# Patient Record
Sex: Female | Born: 1955 | Race: Black or African American | Hispanic: No | Marital: Married | Smoking: Never smoker
Health system: Southern US, Community
[De-identification: ages and names within clinical notes are randomized; demographics above are authoritative.]

## PROBLEM LIST (undated history)

## (undated) DIAGNOSIS — R1310 Dysphagia, unspecified: Secondary | ICD-10-CM

## (undated) DIAGNOSIS — D649 Anemia, unspecified: Secondary | ICD-10-CM

## (undated) DIAGNOSIS — R112 Nausea with vomiting, unspecified: Secondary | ICD-10-CM

## (undated) DIAGNOSIS — K5900 Constipation, unspecified: Secondary | ICD-10-CM

## (undated) DIAGNOSIS — E669 Obesity, unspecified: Secondary | ICD-10-CM

## (undated) DIAGNOSIS — F32A Depression, unspecified: Secondary | ICD-10-CM

## (undated) DIAGNOSIS — N3941 Urge incontinence: Secondary | ICD-10-CM

## (undated) DIAGNOSIS — E559 Vitamin D deficiency, unspecified: Secondary | ICD-10-CM

## (undated) DIAGNOSIS — M87059 Idiopathic aseptic necrosis of unspecified femur: Secondary | ICD-10-CM

## (undated) DIAGNOSIS — E119 Type 2 diabetes mellitus without complications: Secondary | ICD-10-CM

## (undated) DIAGNOSIS — M1990 Unspecified osteoarthritis, unspecified site: Secondary | ICD-10-CM

## (undated) DIAGNOSIS — R600 Localized edema: Secondary | ICD-10-CM

## (undated) DIAGNOSIS — G20 Parkinson's disease: Secondary | ICD-10-CM

## (undated) DIAGNOSIS — R05 Cough: Secondary | ICD-10-CM

## (undated) DIAGNOSIS — E079 Disorder of thyroid, unspecified: Secondary | ICD-10-CM

## (undated) DIAGNOSIS — J45909 Unspecified asthma, uncomplicated: Secondary | ICD-10-CM

## (undated) DIAGNOSIS — M549 Dorsalgia, unspecified: Secondary | ICD-10-CM

## (undated) DIAGNOSIS — K219 Gastro-esophageal reflux disease without esophagitis: Secondary | ICD-10-CM

## (undated) DIAGNOSIS — F329 Major depressive disorder, single episode, unspecified: Secondary | ICD-10-CM

## (undated) DIAGNOSIS — G2581 Restless legs syndrome: Secondary | ICD-10-CM

## (undated) DIAGNOSIS — R079 Chest pain, unspecified: Secondary | ICD-10-CM

## (undated) DIAGNOSIS — G459 Transient cerebral ischemic attack, unspecified: Secondary | ICD-10-CM

## (undated) DIAGNOSIS — M2550 Pain in unspecified joint: Secondary | ICD-10-CM

## (undated) DIAGNOSIS — E7800 Pure hypercholesterolemia, unspecified: Secondary | ICD-10-CM

## (undated) DIAGNOSIS — F419 Anxiety disorder, unspecified: Secondary | ICD-10-CM

## (undated) DIAGNOSIS — G4700 Insomnia, unspecified: Secondary | ICD-10-CM

## (undated) DIAGNOSIS — Z9889 Other specified postprocedural states: Secondary | ICD-10-CM

## (undated) DIAGNOSIS — I10 Essential (primary) hypertension: Secondary | ICD-10-CM

## (undated) DIAGNOSIS — N289 Disorder of kidney and ureter, unspecified: Secondary | ICD-10-CM

## (undated) DIAGNOSIS — R058 Other specified cough: Secondary | ICD-10-CM

## (undated) DIAGNOSIS — G4730 Sleep apnea, unspecified: Secondary | ICD-10-CM

## (undated) HISTORY — DX: Constipation, unspecified: K59.00

## (undated) HISTORY — PX: KNEE ARTHROSCOPY: SUR90

## (undated) HISTORY — DX: Pain in unspecified joint: M25.50

## (undated) HISTORY — DX: Chest pain, unspecified: R07.9

## (undated) HISTORY — DX: Depression, unspecified: F32.A

## (undated) HISTORY — DX: Unspecified asthma, uncomplicated: J45.909

## (undated) HISTORY — DX: Anxiety disorder, unspecified: F41.9

## (undated) HISTORY — DX: Parkinson's disease: G20

## (undated) HISTORY — DX: Obesity, unspecified: E66.9

## (undated) HISTORY — PX: GANGLION CYST EXCISION: SHX1691

## (undated) HISTORY — DX: Restless legs syndrome: G25.81

## (undated) HISTORY — DX: Disorder of thyroid, unspecified: E07.9

## (undated) HISTORY — DX: Vitamin D deficiency, unspecified: E55.9

## (undated) HISTORY — DX: Anemia, unspecified: D64.9

## (undated) HISTORY — PX: EXPLORATORY LAPAROTOMY: SUR591

## (undated) HISTORY — DX: Major depressive disorder, single episode, unspecified: F32.9

## (undated) HISTORY — PX: PILONIDAL CYST / SINUS EXCISION: SUR543

## (undated) HISTORY — DX: Pure hypercholesterolemia, unspecified: E78.00

## (undated) HISTORY — DX: Dysphagia, unspecified: R13.10

## (undated) HISTORY — PX: DILATION AND CURETTAGE OF UTERUS: SHX78

## (undated) HISTORY — DX: Type 2 diabetes mellitus without complications: E11.9

## (undated) HISTORY — DX: Sleep apnea, unspecified: G47.30

## (undated) HISTORY — DX: Localized edema: R60.0

## (undated) HISTORY — DX: Dorsalgia, unspecified: M54.9

---

## 1997-06-20 ENCOUNTER — Other Ambulatory Visit: Admission: RE | Admit: 1997-06-20 | Discharge: 1997-06-20 | Payer: Self-pay | Admitting: Obstetrics & Gynecology

## 1998-07-10 ENCOUNTER — Inpatient Hospital Stay (HOSPITAL_COMMUNITY): Admission: EM | Admit: 1998-07-10 | Discharge: 1998-07-11 | Payer: Self-pay | Admitting: Emergency Medicine

## 1998-07-22 ENCOUNTER — Emergency Department (HOSPITAL_COMMUNITY): Admission: EM | Admit: 1998-07-22 | Discharge: 1998-07-22 | Payer: Self-pay | Admitting: Emergency Medicine

## 1998-07-22 ENCOUNTER — Encounter: Payer: Self-pay | Admitting: Surgery

## 1998-08-10 ENCOUNTER — Other Ambulatory Visit: Admission: RE | Admit: 1998-08-10 | Discharge: 1998-08-10 | Payer: Self-pay | Admitting: Obstetrics and Gynecology

## 1999-06-07 ENCOUNTER — Other Ambulatory Visit: Admission: RE | Admit: 1999-06-07 | Discharge: 1999-06-07 | Payer: Self-pay | Admitting: Gynecology

## 1999-12-29 ENCOUNTER — Ambulatory Visit (HOSPITAL_COMMUNITY): Admission: RE | Admit: 1999-12-29 | Discharge: 1999-12-29 | Payer: Self-pay | Admitting: Gynecology

## 1999-12-29 ENCOUNTER — Encounter: Payer: Self-pay | Admitting: Gynecology

## 2000-09-28 ENCOUNTER — Inpatient Hospital Stay (HOSPITAL_COMMUNITY): Admission: EM | Admit: 2000-09-28 | Discharge: 2000-10-06 | Payer: Self-pay | Admitting: *Deleted

## 2000-10-09 ENCOUNTER — Other Ambulatory Visit (HOSPITAL_COMMUNITY): Admission: RE | Admit: 2000-10-09 | Discharge: 2000-10-19 | Payer: Self-pay | Admitting: Psychiatry

## 2000-12-07 ENCOUNTER — Encounter: Payer: Self-pay | Admitting: Internal Medicine

## 2000-12-07 ENCOUNTER — Encounter: Admission: RE | Admit: 2000-12-07 | Discharge: 2000-12-07 | Payer: Self-pay | Admitting: Internal Medicine

## 2000-12-09 ENCOUNTER — Inpatient Hospital Stay (HOSPITAL_COMMUNITY): Admission: EM | Admit: 2000-12-09 | Discharge: 2000-12-16 | Payer: Self-pay | Admitting: Psychiatry

## 2000-12-18 ENCOUNTER — Other Ambulatory Visit (HOSPITAL_COMMUNITY): Admission: RE | Admit: 2000-12-18 | Discharge: 2000-12-29 | Payer: Self-pay | Admitting: Psychiatry

## 2001-05-11 ENCOUNTER — Inpatient Hospital Stay (HOSPITAL_COMMUNITY): Admission: AD | Admit: 2001-05-11 | Discharge: 2001-05-31 | Payer: Self-pay | Admitting: Psychiatry

## 2003-04-18 ENCOUNTER — Inpatient Hospital Stay (HOSPITAL_COMMUNITY): Admission: EM | Admit: 2003-04-18 | Discharge: 2003-04-22 | Payer: Self-pay | Admitting: Psychiatry

## 2003-07-07 ENCOUNTER — Other Ambulatory Visit (HOSPITAL_COMMUNITY): Admission: RE | Admit: 2003-07-07 | Discharge: 2003-07-10 | Payer: Self-pay | Admitting: Psychiatry

## 2003-07-10 ENCOUNTER — Inpatient Hospital Stay (HOSPITAL_COMMUNITY): Admission: RE | Admit: 2003-07-10 | Discharge: 2003-07-15 | Payer: Self-pay | Admitting: Psychiatry

## 2003-12-30 ENCOUNTER — Other Ambulatory Visit (HOSPITAL_COMMUNITY): Admission: RE | Admit: 2003-12-30 | Discharge: 2004-03-29 | Payer: Self-pay | Admitting: Psychiatry

## 2003-12-30 ENCOUNTER — Ambulatory Visit: Payer: Self-pay | Admitting: Psychiatry

## 2008-09-04 ENCOUNTER — Encounter: Admission: RE | Admit: 2008-09-04 | Discharge: 2008-10-29 | Payer: Self-pay | Admitting: Orthopedic Surgery

## 2009-03-31 ENCOUNTER — Encounter: Admission: RE | Admit: 2009-03-31 | Discharge: 2009-03-31 | Payer: Self-pay | Admitting: Internal Medicine

## 2009-04-04 DIAGNOSIS — Z96641 Presence of right artificial hip joint: Secondary | ICD-10-CM | POA: Insufficient documentation

## 2009-04-04 HISTORY — PX: JOINT REPLACEMENT: SHX530

## 2009-05-14 ENCOUNTER — Ambulatory Visit: Payer: Self-pay | Admitting: Diagnostic Radiology

## 2009-05-14 ENCOUNTER — Emergency Department (HOSPITAL_BASED_OUTPATIENT_CLINIC_OR_DEPARTMENT_OTHER): Admission: EM | Admit: 2009-05-14 | Discharge: 2009-05-15 | Payer: Self-pay | Admitting: Emergency Medicine

## 2009-10-23 ENCOUNTER — Encounter: Admission: RE | Admit: 2009-10-23 | Discharge: 2009-10-23 | Payer: Self-pay | Admitting: Neurology

## 2009-11-02 ENCOUNTER — Encounter: Admission: RE | Admit: 2009-11-02 | Discharge: 2009-11-02 | Payer: Self-pay | Admitting: Neurology

## 2009-11-18 ENCOUNTER — Encounter (INDEPENDENT_AMBULATORY_CARE_PROVIDER_SITE_OTHER): Payer: Self-pay | Admitting: Neurology

## 2009-11-18 ENCOUNTER — Ambulatory Visit (HOSPITAL_COMMUNITY): Admission: RE | Admit: 2009-11-18 | Discharge: 2009-11-18 | Payer: Self-pay | Admitting: Neurology

## 2009-11-18 ENCOUNTER — Ambulatory Visit: Payer: Self-pay | Admitting: Internal Medicine

## 2009-11-18 ENCOUNTER — Ambulatory Visit: Payer: Self-pay

## 2009-11-24 ENCOUNTER — Ambulatory Visit: Payer: Self-pay | Admitting: Cardiovascular Disease

## 2009-11-24 ENCOUNTER — Ambulatory Visit (HOSPITAL_COMMUNITY): Admission: RE | Admit: 2009-11-24 | Discharge: 2009-11-24 | Payer: Self-pay | Admitting: Cardiovascular Disease

## 2009-12-22 ENCOUNTER — Ambulatory Visit (HOSPITAL_COMMUNITY): Admission: RE | Admit: 2009-12-22 | Discharge: 2009-12-22 | Payer: Self-pay | Admitting: Internal Medicine

## 2010-01-22 ENCOUNTER — Inpatient Hospital Stay (HOSPITAL_COMMUNITY): Admission: RE | Admit: 2010-01-22 | Discharge: 2010-01-26 | Payer: Self-pay | Admitting: Orthopedic Surgery

## 2010-04-04 DIAGNOSIS — G459 Transient cerebral ischemic attack, unspecified: Secondary | ICD-10-CM

## 2010-04-04 HISTORY — DX: Transient cerebral ischemic attack, unspecified: G45.9

## 2010-04-25 ENCOUNTER — Encounter: Payer: Self-pay | Admitting: Internal Medicine

## 2010-06-16 LAB — URINALYSIS, ROUTINE W REFLEX MICROSCOPIC
Bilirubin Urine: NEGATIVE
Glucose, UA: NEGATIVE mg/dL
Hgb urine dipstick: NEGATIVE
Ketones, ur: NEGATIVE mg/dL
Nitrite: NEGATIVE
Protein, ur: NEGATIVE mg/dL
Specific Gravity, Urine: 1.018 (ref 1.005–1.030)
Urobilinogen, UA: 0.2 mg/dL (ref 0.0–1.0)
pH: 5.5 (ref 5.0–8.0)

## 2010-06-16 LAB — CBC
HCT: 24.1 % — ABNORMAL LOW (ref 36.0–46.0)
HCT: 24.5 % — ABNORMAL LOW (ref 36.0–46.0)
HCT: 26.5 % — ABNORMAL LOW (ref 36.0–46.0)
HCT: 27.8 % — ABNORMAL LOW (ref 36.0–46.0)
HCT: 40.7 % (ref 36.0–46.0)
Hemoglobin: 8.3 g/dL — ABNORMAL LOW (ref 12.0–15.0)
Hemoglobin: 8.5 g/dL — ABNORMAL LOW (ref 12.0–15.0)
Hemoglobin: 9.1 g/dL — ABNORMAL LOW (ref 12.0–15.0)
Hemoglobin: 9.7 g/dL — ABNORMAL LOW (ref 12.0–15.0)
Hemoglobin: 13.9 g/dL (ref 12.0–15.0)
MCH: 35.1 pg — ABNORMAL HIGH (ref 26.0–34.0)
MCH: 35.1 pg — ABNORMAL HIGH (ref 26.0–34.0)
MCH: 35.3 pg — ABNORMAL HIGH (ref 26.0–34.0)
MCH: 35.4 pg — ABNORMAL HIGH (ref 26.0–34.0)
MCH: 35.2 pg — ABNORMAL HIGH (ref 26.0–34.0)
MCHC: 34.4 g/dL (ref 30.0–36.0)
MCHC: 34.5 g/dL (ref 30.0–36.0)
MCHC: 34.8 g/dL (ref 30.0–36.0)
MCHC: 34.9 g/dL (ref 30.0–36.0)
MCHC: 34.3 g/dL (ref 30.0–36.0)
MCV: 101.1 fL — ABNORMAL HIGH (ref 78.0–100.0)
MCV: 101.6 fL — ABNORMAL HIGH (ref 78.0–100.0)
MCV: 101.7 fL — ABNORMAL HIGH (ref 78.0–100.0)
MCV: 102 fL — ABNORMAL HIGH (ref 78.0–100.0)
MCV: 102.7 fL — ABNORMAL HIGH (ref 78.0–100.0)
Platelets: 167 10*3/uL (ref 150–400)
Platelets: 167 10*3/uL (ref 150–400)
Platelets: 170 10*3/uL (ref 150–400)
Platelets: 183 10*3/uL (ref 150–400)
Platelets: 217 10*3/uL (ref 150–400)
RBC: 2.37 MIL/uL — ABNORMAL LOW (ref 3.87–5.11)
RBC: 2.42 MIL/uL — ABNORMAL LOW (ref 3.87–5.11)
RBC: 2.6 MIL/uL — ABNORMAL LOW (ref 3.87–5.11)
RBC: 2.74 MIL/uL — ABNORMAL LOW (ref 3.87–5.11)
RBC: 3.96 MIL/uL (ref 3.87–5.11)
RDW: 13.1 % (ref 11.5–15.5)
RDW: 13.2 % (ref 11.5–15.5)
RDW: 13.5 % (ref 11.5–15.5)
RDW: 13.7 % (ref 11.5–15.5)
RDW: 14.1 % (ref 11.5–15.5)
WBC: 10 10*3/uL (ref 4.0–10.5)
WBC: 10.7 10*3/uL — ABNORMAL HIGH (ref 4.0–10.5)
WBC: 10.9 10*3/uL — ABNORMAL HIGH (ref 4.0–10.5)
WBC: 8.7 10*3/uL (ref 4.0–10.5)
WBC: 5.7 10*3/uL (ref 4.0–10.5)

## 2010-06-16 LAB — TYPE AND SCREEN
ABO/RH(D): A POS
Antibody Screen: NEGATIVE

## 2010-06-16 LAB — BASIC METABOLIC PANEL
BUN: 11 mg/dL (ref 6–23)
BUN: 12 mg/dL (ref 6–23)
CO2: 26 mEq/L (ref 19–32)
CO2: 28 mEq/L (ref 19–32)
Calcium: 8.2 mg/dL — ABNORMAL LOW (ref 8.4–10.5)
Calcium: 8.2 mg/dL — ABNORMAL LOW (ref 8.4–10.5)
Chloride: 106 mEq/L (ref 96–112)
Chloride: 106 mEq/L (ref 96–112)
Creatinine, Ser: 1.07 mg/dL (ref 0.4–1.2)
Creatinine, Ser: 1.25 mg/dL — ABNORMAL HIGH (ref 0.4–1.2)
GFR calc Af Amer: 54 mL/min — ABNORMAL LOW (ref >60)
GFR calc Af Amer: >60 mL/min (ref >60)
GFR calc non Af Amer: 45 mL/min — ABNORMAL LOW (ref >60)
GFR calc non Af Amer: 53 mL/min — ABNORMAL LOW (ref >60)
Glucose, Bld: 169 mg/dL — ABNORMAL HIGH (ref 70–99)
Glucose, Bld: 198 mg/dL — ABNORMAL HIGH (ref 70–99)
Potassium: 4 mEq/L (ref 3.5–5.1)
Potassium: 4.5 mEq/L (ref 3.5–5.1)
Sodium: 135 mEq/L (ref 135–145)
Sodium: 138 mEq/L (ref 135–145)

## 2010-06-16 LAB — COMPREHENSIVE METABOLIC PANEL
ALT: 19 U/L (ref 0–35)
AST: 22 U/L (ref 0–37)
Albumin: 3.8 g/dL (ref 3.5–5.2)
Alkaline Phosphatase: 28 U/L — ABNORMAL LOW (ref 39–117)
BUN: 12 mg/dL (ref 6–23)
CO2: 27 mEq/L (ref 19–32)
Calcium: 9.5 mg/dL (ref 8.4–10.5)
Chloride: 106 mEq/L (ref 96–112)
Creatinine, Ser: 1.4 mg/dL — ABNORMAL HIGH (ref 0.4–1.2)
GFR calc Af Amer: 47 mL/min — ABNORMAL LOW (ref >60)
GFR calc non Af Amer: 39 mL/min — ABNORMAL LOW (ref >60)
Glucose, Bld: 99 mg/dL (ref 70–99)
Potassium: 4.1 mEq/L (ref 3.5–5.1)
Sodium: 140 mEq/L (ref 135–145)
Total Bilirubin: 0.6 mg/dL (ref 0.3–1.2)
Total Protein: 7.6 g/dL (ref 6.0–8.3)

## 2010-06-16 LAB — SURGICAL PCR SCREEN
MRSA, PCR: NEGATIVE
Staphylococcus aureus: NEGATIVE

## 2010-06-16 LAB — PROTIME-INR
INR: 1.24 (ref 0.00–1.49)
INR: 1.87 — ABNORMAL HIGH (ref 0.00–1.49)
INR: 1.94 — ABNORMAL HIGH (ref 0.00–1.49)
INR: 2.05 — ABNORMAL HIGH (ref 0.00–1.49)
INR: 1.04 (ref 0.00–1.49)
Prothrombin Time: 15.8 seconds — ABNORMAL HIGH (ref 11.6–15.2)
Prothrombin Time: 21.7 seconds — ABNORMAL HIGH (ref 11.6–15.2)
Prothrombin Time: 22.3 seconds — ABNORMAL HIGH (ref 11.6–15.2)
Prothrombin Time: 23.3 seconds — ABNORMAL HIGH (ref 11.6–15.2)
Prothrombin Time: 13.8 seconds (ref 11.6–15.2)

## 2010-06-16 LAB — ABO/RH: ABO/RH(D): A POS

## 2010-06-16 LAB — APTT: aPTT: 26 seconds (ref 24–37)

## 2010-06-16 LAB — URINE MICROSCOPIC-ADD ON

## 2010-06-24 LAB — DIFFERENTIAL
Basophils Absolute: 0.1 10*3/uL (ref 0.0–0.1)
Basophils Relative: 1 % (ref 0–1)
Eosinophils Absolute: 0 10*3/uL (ref 0.0–0.7)
Eosinophils Relative: 1 % (ref 0–5)
Lymphocytes Relative: 47 % — ABNORMAL HIGH (ref 12–46)
Lymphs Abs: 3.6 10*3/uL (ref 0.7–4.0)
Monocytes Absolute: 0.5 10*3/uL (ref 0.1–1.0)
Monocytes Relative: 7 % (ref 3–12)
Neutro Abs: 3.4 10*3/uL (ref 1.7–7.7)
Neutrophils Relative %: 45 % (ref 43–77)

## 2010-06-24 LAB — BASIC METABOLIC PANEL
BUN: 14 mg/dL (ref 6–23)
CO2: 27 mEq/L (ref 19–32)
Calcium: 9 mg/dL (ref 8.4–10.5)
Chloride: 103 mEq/L (ref 96–112)
Creatinine, Ser: 1.2 mg/dL (ref 0.4–1.2)
GFR calc Af Amer: 57 mL/min — ABNORMAL LOW (ref >60)
GFR calc non Af Amer: 47 mL/min — ABNORMAL LOW (ref >60)
Glucose, Bld: 224 mg/dL — ABNORMAL HIGH (ref 70–99)
Potassium: 4.2 mEq/L (ref 3.5–5.1)
Sodium: 138 mEq/L (ref 135–145)

## 2010-06-24 LAB — POCT CARDIAC MARKERS
CKMB, poc: <1 ng/mL — ABNORMAL LOW (ref 1.0–8.0)
CKMB, poc: <1 ng/mL — ABNORMAL LOW (ref 1.0–8.0)
Myoglobin, poc: 39.1 ng/mL (ref 12–200)
Myoglobin, poc: 61.3 ng/mL (ref 12–200)
Troponin i, poc: <0.05 ng/mL (ref 0.00–0.09)
Troponin i, poc: <0.05 ng/mL (ref 0.00–0.09)

## 2010-06-24 LAB — CBC
HCT: 38.3 % (ref 36.0–46.0)
Hemoglobin: 13.1 g/dL (ref 12.0–15.0)
MCHC: 34.1 g/dL (ref 30.0–36.0)
MCV: 102.3 fL — ABNORMAL HIGH (ref 78.0–100.0)
Platelets: 224 10*3/uL (ref 150–400)
RBC: 3.74 MIL/uL — ABNORMAL LOW (ref 3.87–5.11)
RDW: 13.2 % (ref 11.5–15.5)
WBC: 7.6 10*3/uL (ref 4.0–10.5)

## 2010-06-24 LAB — URINALYSIS, ROUTINE W REFLEX MICROSCOPIC
Bilirubin Urine: NEGATIVE
Glucose, UA: 250 mg/dL — AB
Hgb urine dipstick: NEGATIVE
Ketones, ur: NEGATIVE mg/dL
Nitrite: NEGATIVE
Protein, ur: NEGATIVE mg/dL
Specific Gravity, Urine: 1.008 (ref 1.005–1.030)
Urobilinogen, UA: 0.2 mg/dL (ref 0.0–1.0)
pH: 6.5 (ref 5.0–8.0)

## 2010-06-24 LAB — D-DIMER, QUANTITATIVE (NOT AT ARMC): D-Dimer, Quant: 0.23 ug/mL-FEU (ref 0.00–0.48)

## 2010-07-16 ENCOUNTER — Inpatient Hospital Stay (INDEPENDENT_AMBULATORY_CARE_PROVIDER_SITE_OTHER)
Admission: RE | Admit: 2010-07-16 | Discharge: 2010-07-16 | Disposition: A | Discharge status: Home / Self Care | Payer: 59 | Source: Ambulatory Visit | Attending: Family Medicine | Admitting: Family Medicine

## 2010-07-16 DIAGNOSIS — J309 Allergic rhinitis, unspecified: Secondary | ICD-10-CM

## 2010-08-20 NOTE — H&P (Signed)
[facility]  Patient:    Deanna Rowe, Deanna Rowe                       MRN: [id number] Adm. Date:  [id number] Attending:  Denny Peon Dictator:   Young Berry Lorin Picket, R.N., F.N.P.                         History and Physical  PAST MEDICAL HISTORY: 1. Exploratory laparotomy in 1979. 2. Three D&Cs for menorrhagia. 3. History of pilonidal cyst. 4. Arthroscopy of the left knee following a medial collateral ligament tear. 5. Bilateral tubal ligation in 1986. 6. Cardiac catheterization in 2000 for problems of chest pain.  HISTORY OF PRESENT ILLNESS:  The patient today complains of mild continuous central chest pain, nonradiating, dull.  She attributes this to chronic problems with anxiety had a full workup in the past including the heart catheterization in 2000.  The patient has also been diagnosed with some left ventricular enlargement secondary to hypertension.  She does have a history of hypertension and migraine headaches.  PREVENTATIVE CARE:  The patients last Pap smear was in April 2002 and the patient had a mammogram one year ago.  She has never taken flu vaccine.  CONSTITUTION:  This is a well-nourished, well-developed African-American female who appears healthy and well-nourished.  The patient is relaxed and cooperative.  She has a blunted affect and appears sad and depressed; however, she is able to appreciate humor.  The patient is 5 feet 6 inches tall and 155 pounds.  PHYSICAL EXAMINATION:  VITAL SIGNS:  Today, temperature 97.4, pulse 78, respirations 24, blood pressure 118/73.  SKIN AND HAIR:  Pale in tone.  Hair is of medium texture.  Hygiene is good. Hair has been tinted reddish and the patient is wearing a hairpiece that is anchored to her scalp.  Hair distribution is appropriate for age and sex.  HEENT:  Head is normocephalic without lesions or masses.  Scalp is in good condition.  Eyes: Pupils equal, round and minimally reactive to  light and accommodation.  Vessels show no nicking or signs of copper wiring bilaterally. Optic disks not visualized.   Sclerae are clear, nonicteric.  Ears: Ear canals are patent bilaterally with good light reflex on bilateral TMs.  Nose: Septum is midline without perforations.  Nasal mucosa: Pink and moist, no rhinorrhea. Mouth: The patient wears full upper plate of dentures and partial lower plate. Oral hygiene is good, no breath odors.  Uvula is midline.  Tongue is midline without fasciculations.  NECK:  Supple without thyromegaly.  CARDIOVASCULAR:  S1 and S2 heard.  Regular rate and rhythm, no clicks, murmurs, or gallops.  Carotids are 2+/5, no bruits, no abdominal bruits heard. Peripheral pulses are 2+.  EXTREMITIES:  Pink and warm with good capillary refill and no peripheral edema.  LUNGS:  Clear to auscultation.  The patient has no flank tenderness.  BREAST:  Deferred.  ABDOMEN:  Bowel sounds present in all four quadrants.  Liver percusses at the costal margin.  No masses or tenderness.  Abdomen is flat.  GENITALIA:  Deferred.  MUSCULOSKELETAL:  The patient is of straight and upright posture with normal gait and normal arm swing.  Needs no assistive devices.  Balance is generally good.  The patient can bend over and touch her toes.  Movements slightly restricted on the left because of tightness in her left knee.  No erythema or swelling noted in  any joints.  Spine is straight.  NEUROLOGIC:  Extraocular movements are intact.  Cranial nerves II-XII are intact.  Motor movements are smooth without tremor.  Sensory is intact.  Deep tendon reflexes: Biceps, patellar, and Achilles reflexes are 2+/5.  Romberg is without finding. DD:  10/04/00 TD:  10/04/00 Job: [id number]

## 2010-08-20 NOTE — H&P (Signed)
[facility]  Patient:    Deanna Rowe, Deanna Rowe Visit Number: [id number] MRN: [id number]          Service Type: PSY Location: 500 4782 01 Attending Physician:  Jeanice Lim Dictated by:   Young Berry Scott, R.N. N.P. Admit Date:  05/11/2001                     Psychiatric Admission Assessment  DATE OF ADMISSION:  May 11, 2001  DATE OF ASSESSMENT:  May 12, 2001, at 1:15 p.m.  PATIENT IDENTIFICATION:  This is a 55 year old married African-American female who is voluntary.  HISTORY OF PRESENT ILLNESS:  This patient was referred by her psychiatrist for suicidal ideation with a plan to cut her wrists.  It is also noted that she had attempted to overdose on Christmas Eve on her Valium and was caught by her husband who has been managing her medicines since that episode.  The patient today reports increased depression with anhedonia and decreased concentration since approximately May 2002 coinciding with increased exacerbation of multiple sclerosis of her sister, Jacqulyn Cane, for whom she was the primary caregiver and her sister subsequently died in 11-15-00.  The patient was her sisters primary caregiver.  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  The patient endorses severe anhedonia gradually worsening for two weeks, being unable to move off the couch and do anything, extreme lethargy, terminal insomnia falling asleep at 10:30 or 11:00 each night, awakening at 2:00 and being unable to return to sleep.  The patient endorses positive suicidal ideation with plan to cut her wrists and is able to contract for safety on the unit.  She denies any homicidal ideation, any auditory or visual hallucinations.  PAST PSYCHIATRIC HISTORY:  The patient is followed by [name] at his private practice.  She also sees a psychotherapist weekly named Dr. Fritzi Mandes, sees him every Wednesday and has been compliant with those appointments.  She reports compliance with her medications.  She was last hospitalized at [facility] in June 2002 and 08-14-02also for depression.  SUBSTANCE ABUSE HISTORY:  The denies any history of substance abuse or any recent substance abuse.  PAST MEDICAL HISTORY:  The patient is followed by Dr. Dorna Bloom at [facility] at [place].  Medical problems include hypertension, migraine headache, mild left ventricular dysfunction.  The patient also has a past medical history of a bilateral tubal ligation with history of three D&C, exploratory laparotomy for a tubal pregnancy approximately 19 years ago, and excision of a pilonidal cyst.  MEDICATIONS: 1. Midrin which she takes for headache. 2. Wellbutrin SR 150 mg q.a.m. and 4 p.m. 3. Geodon 40 mg q.h.s. 4. Lexapro 20 mg q.d. 5. Ambien 5 mg q.h.s. 6. Verapamil ER 240 mg q.h.s. 7. Lithobid 300 mg q.d. and 600 mg q.h.s. 8. Lasix 40 mg q.d. to treat edema as a side effect of some her medications. 9. Valium 5 mg q.a.m. and at 6 p.m.  DRUG ALLERGIES:  ASPIRIN which causes GI upset, AMOXICILLIN which causes a rash, and XANAX which causes her headaches.  REVIEW OF SYSTEMS:  Some history of anorexia with no specific weight change. The patient also has a marked white coating on her tongue which is somewhat thin in nature but is definitely visible.  She states this coincided with returning to her Wellbutrin.  She is not  specifically attributing it to the Wellbutrin though she knows it is related to one of her medications but she is unclear which one.  The patient also has a history of some peripheral edema and some mild weight gain occasionally from fluid that tends to fluctuate but denies any history of angina, syncope, or palpitations or hypertension.  She has been diagnosed with a mild left ventricular dysfunction but states this has not caused  her any problems in her daily functioning.  PULMONARY: The patient denies any history of wheezes or asthma.  She is a nonsmoker. NEUROLOGIC: No history of seizures or impaired memory.  She does have some history of lightheadedness which tends to occur in the evenings.  PHYSICAL EXAMINATION:  GENERAL:  This is a well-nourished, well-developed healthy appearing African-American female who is fully alert and in no acute distress.  She is of medium build, very mildly overweight.  VITAL SIGNS:  This morning, temperature 98, pulse 98, respirations 18, blood pressure 118/77.  HEAD:  Normocephalic.  SKIN:  Medium in tone and smooth without remarkable features, no rashes or lesions.  EENT:  PERRLA.  EACs are patent.  TMs are normal.  No rhinorrhea. Oropharynx: Noninjected.  The patient is wearing both upper and lower plate of dentures, does have a thin, white, smooth coating on her tongue.  Other oral mucosa is smooth.  Gingiva appear healthy.  No breath odor.  NECK:  Supple, no thyromegaly, no masses palpated, no bruits.  CARDIOVASCULAR:  S1 and S2 heard, no clicks, murmurs, or gallops.  Peripheral pulses are 2+/5.  Extremities are pink and warm with no evidence of edema.  LUNGS:  Clear to auscultation.  ABDOMEN:  Rounded, soft and nontender, no masses palpable.  No CVA tenderness to palpation.  GENITALIA:  Deferred.  BREAST:  Deferred.  MUSCULOSKELETAL:  Posture: Upright.  Gait: Grossly normal but the patient does demonstrate some mild psychomotor swelling with movements.  Spine is straight.  She is independent in ADLs.  Strength is 5/5 throughout.  NEUROLOGIC:  Cranial nerves II-XII are intact.  Motor movements are smooth without tremor.  Sensory: Grossly intact.  EOMs are intact without nystagmus. Tongue is midline without fasciculation.  Reflexes are 2+/5 and symmetrical. Romberg: Without findings.  Babinski is equivocal.  LABORATORY DATA:  Negative urine pregnancy test.   CBC is within normal limits with WBC 7.9, RBC 3.88, hemoglobin 12.9, hematocrit 38.6, MCV is 99.3,  platelets are 242.  CMET reveals electrolytes within normal limits, BUN 9, creatinine 1.2.  SGOT 16, SGPT 12.  Thyroid panel is within normal limits. TSH is 1.306, T3 2.3, T4 mildly low at 0.81.  Lithium level is 1.28. Urinalysis is normal.  Urine drug screen is currently pending.  SOCIAL HISTORY:  The patient is married for the past 22 years.  She has a supportive husband.  She has two daughters ages 70 and 66.  The 23-year-old is in college and the 56 year old is at home.  Family life is generally going well.  Daughter is doing well in school.  Husband is gainfully employed.  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  The patient had a brother who died of coronary artery disease in June 2002 and a sister, Jacqulyn Cane, who died of multiple sclerosis in 2000-12-10.  FAMILY HISTORY:  The patient is one of 12 children and she reports  the only person in her family with any history of mental illness.  MENTAL STATUS EXAMINATION:  This is a healthy appearing African-American female who is fully alert in no acute distress.  She does have a very flattened affect with, as already noted, mild psychomotor slowing.  Speech is soft in tone but otherwise normal in pace.  She does have some slowing of responses.  Eye contact is good.  She is focused and calm.  Mood is very depressed.  Thought process is positive for suicidal ideation with a plan. She is able to promise safety on the unit.  She does have some vague paranoia evident with feeling that everyone is looking at her and noting that she is back here again and feeling that people are paying an overt amount of attention to her.  No evidence of homicidal ideation.  No hallucinations evident.  Cognitive: Intact and  oriented x 2.  ADMISSION DIAGNOSES: Axis I:    Major depression, recurrent, severe with psychotic features. Axis II:   Deferred. Axis III:  Chronic edema secondary to medications. Axis IV:   Moderate psychosocial problems with the feeling of grief over the            death of her sister. Axis V:    Current 28, past year 60.  INITIAL PLAN OF CARE:  Plan is to voluntarily admit the patient to stabilize her mood and to alleviate her suicidal ideation.  We are going to start by moving her Geodon dose from h.s. to suppertime so she can get maximum effect by taking it with food and we will also give her 20 mg in the morning after breakfast.  We will consider increasing her Wellbutrin after we see how she responds to the Geodon and we will continue her other medications.  We will plan on having her follow up with [name] and Dr. Jean Rosenthal every Wednesday. Meanwhile we will start her on intensive individual and group psychotherapy. Dictated by:   Young Berry Scott, R.N. N.P. Attending Physician:  Jeanice Lim DD:  05/12/01 TD:  05/12/01 Job: [id number]

## 2010-08-20 NOTE — Discharge Summary (Signed)
Deanna, Rowe                        ACCOUNT NO.:  [id number]   MEDICAL RECORD NO.:  [id number]                   PATIENT TYPE:  IPS   LOCATION:  0304                                 FACILITY:  [facility]   PHYSICIAN:  Jeanice Lim, M.D.              DATE OF BIRTH:  26-Jul-1955   DATE OF ADMISSION:  07/10/2003  DATE OF DISCHARGE:  07/15/2003                                 DISCHARGE SUMMARY   IDENTIFYING DATA:  This is a 55 year old married African-American female  voluntarily admitted, with a history of depression, passive suicidal  ideation with plan to run into a telephone pole. Feeling helpless and  thinking about sister's death two years ago. Had recent episode of ECT and  did well for a while. Reported difficulty sleeping, racing thoughts. Had  been at the [facility] August 2004. Sees [name] as an outpatient.  Had 8 ECT treatments, had overdosed two years ago. Had been in the IOP  program with Dr. Ladona Ridgel.   MEDICATIONS:  1. Diovan.  2. Lexapro increased two weeks ago from 20 to 30 mg.  3. Prempro 2.5 daily.  4. Geodon 160 mg q.h.s.  5. Xanax 2 mg q.a.m. and q.h.s.   ALLERGIES:  CODEINE, AMOXICILLIN, COGENTIN, and ASPIRIN.   PHYSICAL EXAMINATION:  Essentially within normal limits. Neurologically  nonfocal.   ROUTINE ADMISSION LABORATORY DATA:  Within normal limits.   MENTAL STATUS EXAM:  Alert, middle-aged, cooperative. Good eye contact.  Speech clear, articulate. Mood depressed. Anxious affect, anxiety, coherent  thought process. No evidence of psychosis. Cognitively intact. Judgment and  insight fair. The patient reported obsessive strong urges to kill herself  and had a long history of treatment refractory depression responding only to  ECT despite high dose polypharmacy.   ADMISSION DIAGNOSES:   AXIS I:  Major depressive disorder, recurrent, severe, with psychotic  features, rule out bipolar disorder.   AXIS II:  Deferred.   AXIS III:   Hypertension.   AXIS IV:  Moderate, problems with primary support group and psychosocial  stressors related to chronic severe partially treatment resistant  depression.   AXIS V:  30/50.   HOSPITAL COURSE:  The patient was admitted and ordered routine p.r.n.  medications and underwent further monitoring. Encouraged to participate in  individual, group, and milieu therapy. Was resumed on psychotropics. Placed  on safety checks. The patient reported initially she did not want an ECT  would agree to whatever the doctor recommended. Family initially was  ambivalent about ECT but then agreed that this was necessary due to  patient's past response, and she likely needs maintenance ECT to remain  stable. Aware that patient in past had been optimized on medications, tried  on multiple different combinations of high dose effective medicines for  difficult to treat depression; however, had not responded. She reported  continued suicidal ideation, and after further monitoring, it was clear that  patient did  in fact need ECT, and she was very agreeable as well as the  husband was agreeable. The patient was set up for inpatient ECT and  transferred for further inpatient treatment of severe depression with  suicidal urge and plan. The patient was given medication education. She will  continue medications currently prescribed and undergone further evaluation  and stabilization inpatient at [facility] where she had received ECT  previously.   DISCHARGE DIAGNOSES:   AXIS I:  Major depressive disorder, recurrent, severe, with psychotic  features, rule out bipolar disorder.   AXIS II:  Deferred.   AXIS III:  Hypertension.   AXIS IV:  Moderate, problems with primary support group and psychosocial  stressors related to chronic severe partially treatment resistant  depression.   AXIS VPearla Rowe, M.D.    JEM/MEDQ  D:   08/17/2003  T:  08/18/2003  Job:  [id number]

## 2010-08-20 NOTE — Discharge Summary (Signed)
[facility]  Patient:    Deanna Rowe, Deanna Rowe Visit Number: [id number] MRN: [id number]          Service Type: PSY Location: 500 1308 01 Attending Physician:  Jeanice Lim Dictated by:   Reymundo Poll Dub Mikes, M.D. Admit Date:  05/11/2001 Discharge Date: 05/31/2001                             Discharge Summary  CHIEF COMPLAINT AND HISTORY OF PRESENT ILLNESS:  This was the third hospitalization at [facility] for this 55 year old female referred by her psychiatrist due to suicidal ideas with a plan to cut her wrists.  She had attempted to overdose on Christmas Eve on Valium, was caught by her husband who had been managing her medications.  Increased depression with anhedonia, decreased concentration since May 2002 coinciding with increased exacerbation of multiple sclerosis of her sister; she was the primary care giver.  She died late in 12/11/00.  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  Severe anhedonia worsening for two weeks, unable to move off the couch, extreme lethargy, terminal insomnia.  Positive suicidal ideas with a plan.  PAST PSYCHIATRIC HISTORY:  Followed by [name]; also sees a psychotherapist, Dr. Fritzi Mandes every week.  Had been at [facility] in June 2002 and 2000/12/11.  PAST MEDICAL HISTORY:  Migraine headaches, hypertension, mild left ventricular dysfunction, bilateral tubal ligation.  MEDICATIONS ON ADMISSION: 1. Midrin p.r.n. for headaches. 2. Wellbutrin slow release 150 mg in the morning and at bedtime. 3. Geodon 40 mg at bedtime. 4. Lexapro 10 mg daily. 5. Ambien 5 mg daily. 6. Verapamil ER 240 mg at bedtime. 7. Lithobid 300 mg in the morning and 600 mg at bedtime. 8. Lasix 40 mg. 9. Valium 5 mg in the morning at at 6 p.m.  PHYSICAL EXAMINATION:  GENERAL:  Performed and failed to show any acute  findings.  MENTAL STATUS EXAMINATION ON ADMISSION:  Well-nourished, well-developed, alert, cooperative female.  Does have a very flattened affect with mild psychomotor slowing.  Speech was soft in tone, otherwise normal in pace, some slowing of her responses.  Eye contact was good, focused, calm.  Mood was very depressed.  Thought content: Positive for suicidal ideas with a plan, promised safety on the unit; some vague paranoia that everyone was looking at her, noting that she was back here again, feeling that people were paying an overt amount of attention to her.  ADMITTING DIAGNOSES: Axis I:    Major depression, recurrent, severe with psychotic features. Axis II:   No diagnosis. Axis III:  Chronic anemia. Axis IV:   Moderate. Axis V:    Global assessment of functioning upon admission 28, highest global            assessment of functioning in the last year 60.  LABORATORY DATA:  CBC was within normal limits.  Serum electrolytes and blood chemistries were within normal limits.  Thyroid profile was within normal limits.  Lithium level was 1.28.  EKG was within normal limits.  HOSPITAL COURSE:  She was admitted and started in intensive individual and group psychotherapy.  She was kept on her medications and we tried to modify medications as tolerated.  Lexapro was increased to 30 mg daily and Geodon to 60 mg.  Geodon was eventually increased to 80  mg.  She had to be placed on one-to-one due to acute suicidal ideation.  We went ahead and decreased the Valium and discontinued it.  There was no movement in the depression.  She continued to endorse suicidal ruminations, suicidal ideas with a plan; the depression was not getting any better.  Started discussing the possibility of ECT.  More and more this became the number one option.  ECT was also recommended by her primary psychiatrist.  Met with the husband, met with the patient, and we decided to go ahead and make the arrangements for  her to be transferred for ECT.  Initially the transfer was scheduled to be to [facility]  It took too long so she was transferred to [facility] to get the procedure.  DISCHARGE DIAGNOSES: Axis I:    Major depression, recurrent with psychotic features. Axis II:   No diagnosis. Axis III:  No diagnosis. Axis IV:   Severe. Axis V:    Global assessment of functioning upon discharge 25.  DISCHARGE MEDICATIONS: 1. Lasix 40 mg daily. 2. Verapamil ER 240 mg at bedtime. 3. Lithobid 300 mg in the morning and 600 mg at night. 4. Wellbutrin slow release 200 mg twice a day. 5. Lexapro 20 mg one and a half daily. 6. Geodon 80 mg at bedtime. 7. Ambien as needed for sleep.  DISPOSITION:  Direct admission to [facility] for ECT. Dictated by:   Reymundo Poll Dub Mikes, M.D. Attending Physician:  Jeanice Lim DD:  07/04/01 TD:  07/05/01 Job: [id number]

## 2010-08-20 NOTE — Discharge Summary (Signed)
NAMEMAME, TWOMBLY                        ACCOUNT NO.:  [id number]   MEDICAL RECORD NO.:  [id number]                   PATIENT TYPE:  IPS   LOCATION:  0503                                 FACILITY:  [facility]   PHYSICIAN:  Jeanice Lim, M.D.              DATE OF BIRTH:  1955/04/23   DATE OF ADMISSION:  04/18/2003  DATE OF DISCHARGE:  04/22/2003                                 DISCHARGE SUMMARY   IDENTIFYING DATA:  This is a 55 year old married African-American female,  voluntarily admitted, presenting with a history of depression, having  suicidal thoughts to overdose.  Had been doing well until November when  depression increased, Haldol Decanoate been compliant with medications and  follow-up.  Had stopped bathing and doing ADLs.  Has been on medical leave  and has 2 siblings who have passed recent.  Second hospitalization, positive  history of ECT with 8 treatments in February at [facility].  History of  overdosing 2 years ago.   ADMISSION MEDICATIONS:  Ambien 10 mg q.h.s., Wellbutrin, Prempro, Diovan and  hydrochlorothiazide, Vistaril, Xanax and Lamictal and Geodon 80 mg b.i.d.  and Lexapro 30 mg q.a.m.   ALLERGIES:  ASPIRIN, CODEINE, AMOXYCILLIN, COGENTIN.   PHYSICAL EXAMINATION:  Essentially within normal limits, neurologically  nonfocal.   ROUTINE ADMISSION LABS:  Essentially within normal limits.   MENTAL STATUS EXAM:  Alert, middle-aged, neatly dressed female, good eye  contact, speech clear and articulate.  Mood anxious.  The patient worried  about depression and psychotic symptoms.  Cognitively intact, judgment and  insight good.   ADMISSION DIAGNOSES:   AXIS I:  1. Major depressive disorder.  2. Generalized anxiety disorder.   AXIS II:  Deferred.   AXIS III:  Hypertension.   AXIS IV:  Grief due to recent events, occupation, medical problems.   AXIS V:  [id number]   HOSPITAL COURSE:  The patient was admitted and ordered routine p.r.n.  medications, underwent  further monitoring, and was encouraged to participate  in individual, group and milieu therapy.  The patient was stabilized on  medications, psychotropics resumed, and Lexapro optimized along with Geodon  to further stabilize mood, as well as Lamictal to further stabilize mood.  The patient was already on very therapeutic doses of multiple medications  and likely needed ECT for which she was evaluated, and after monitoring and  continued strong suicidal urges, the patient was recommended for ECT.  The  family was in agreement after a family meeting, and the patient was  discharged in unimproved condition to receive ECT for stabilization of  severe, treatment-refractory depression, having failed outpatient treatment.  The patient was sent to [facility] for ECT, to continue medications  she was admitted on except for Diovan due to stable blood pressure.   DISCHARGE DIAGNOSES:   AXIS I:  1. Major depressive disorder.  2. Generalized anxiety disorder.   AXIS II:  Deferred.   AXIS III:  Hypertension.   AXIS IV:  Grief due to recent events, occupation, medical problems.   AXIS V:  Global assessment of function on discharge was 30.                                               Jeanice Lim, M.D.    JEM/MEDQ  D:  05/21/2003  T:  05/22/2003  Job:  [id number]

## 2010-08-20 NOTE — H&P (Signed)
[facility]  Patient:    AMAL, RENBARGER Visit Number: [id number] MRN: [id number]          Service Type: EMS Location: ED Attending Physician:  Corlis Leak. Dictated by:   Jasmine Pang, M.D. Admit Date:  12/08/2000 Discharge Date: 12/09/2000   CC:         [facility]; ATTN:  [name]   Psychiatric Admission Assessment  IDENTIFYING INFORMATION:  This is a 55 year old African-American female, who was admitted on a voluntary basis due to suicidal ideation.  The patient was hospitalized at [facility] one month ago with a history of major depression.  She has recently experienced a worsening of her symptoms with increasing anxiety, depression and multiple neurovegetative symptoms.  The latter include anergia, anhedonia, decreased appetite, irritability, feelings of hopelessness and worthlessness.  She has had escalating suicidal ideation and planned to overdose on Tylenol prior to admission.  She had also attempted to take too many Valium before her husband took these from her.  PAST PSYCHIATRIC HISTORY:  The patient was hospitalized at [facility] In[facility] one month ago.  She then went to the [facility] Intensive Outpatient Psychiatry Program. She currently sees [name] at [facility].  PSYCHIATRIC MEDICATIONS:  Zoloft 200 mg q.d. (this was just increased by [name]), Zyprexa 10 mg q.h.s., Wellbutrin SR 150 mg b.i.d., Valium 10 mg b.i.d. and 5 mg at noon, Ambien 10 mg p.o. q.h.s.  PAST MEDICAL HISTORY:  The patient has a history of hypertension and migraine headaches.  MEDICATIONS:  In addition to the psychiatric medications above, she is on verapamil 10 mg p.o. q.h.s. and Midrin.  DRUG ALLERGIES:  ASPIRIN, AMOXICILLIN, CODEINE, XANAX.  FAMILY HISTORY:  The patient denies.  SUBSTANCE ABUSE HISTORY:  The patient denies abuse of drugs or alcohol.  SOCIAL HISTORY:  She lives with  her husband and child.  She states her husband has not understood her depression and is encouraging her to stop her medication.  She hopes he can be seen in a family session during the hospitalization to educate him on the depression.  She has a 49 year old child at home and one child in college.  She has no legal problems.  History of physical or sexual abuse is unknown at this time.  ADMISSION MENTAL STATUS EXAMINATION:  The patient presented as a cooperative, friendly, African-American female.  She was casually dressed, somewhat disheveled.  She had intermittent eye contact.  Speech soft and slow.  There was no articulation disorder.  Mood anxious, depressed.  Affect consistent with mood, constricted.  There was no suicidal or homicidal ideation.  There was no psychosis or perceptual disturbance (patient does state that she sees a continual image in her mind of her sister, who recently died from complications from MS).  Thought processes were somewhat slow and, at times, disorganized.  Overall, logical and goal directed.  Thought content revolved around her concern that she was letting her family down because of her depression.  On cognitive exam, alert and oriented x 4.  Short-term and long-term memory adequate.  General fund of knowledge age and education-level appropriate.  Attention and concentration diminished.  Insight fair.  Judgment fair.  ADMISSION DIAGNOSES: Axis I:    1. Major depressive disorder, recurrent, severe.            2. Anxiety disorder not otherwise specified. Axis II:   Deferred. Axis III:  1. Hypertension.  2. Migraines. Axis IV:   Moderate. Axis V:    Current Global Assessment of Functioning 20; highest past year 60.  STRENGTHS AND ASSETS:  Healthy.  Overall supportive family.  PROBLEMS:  Mood instability with some thought disorganization and suicidal ideation.  SHORT-TERM TREATMENT GOAL:  Resolution of suicidal ideation, improved  thought organization.  LONG-TERM TREATMENT GOAL:  Resolution of mood instability.  CONDITION NECESSARY FOR DISCHARGE:  Not suicidal, thoughts better organized.  ESTIMATED LENGTH OF STAY:  Three to five days.  PLAN:  Increase Zyprexa to 15 mg p.o. q.h.s. to improve thought organization. Increase Valium to 10 mg t.i.d.  Continue other medications.  Unit therapeutic groups and activities.  Family therapy.  POST-HOSPITAL CARE PLANS:  Return home to live with family.  Follow-up therapy and medication management will be at [facility]. Dictated by:   Jasmine Pang, M.D. Attending Physician:  Corlis Leak DD:  12/10/00 TD:  12/10/00 Job: [id number]

## 2010-08-20 NOTE — H&P (Signed)
Deanna Rowe, Deanna Rowe                        ACCOUNT NO.:  [id number]   MEDICAL RECORD NO.:  [id number]                   PATIENT TYPE:  IPS   LOCATION:  0503                                 [facility]ILITY:  [facility]   PHYSICIAN:  Jeanice Lim, M.D.              DATE OF BIRTH:  05/18/1955   DATE OF ADMISSION:  04/18/2003  DATE OF DISCHARGE:                                HISTORY & PHYSICAL   IDENTIFYING INFORMATION:  A 55 year old married African-American female  voluntarily admitted on April 18, 2003.   HISTORY OF PRESENT ILLNESS:  The patient presents with a history of  depression, having suicidal thoughts to overdose.  The patient states she  was doing well until November when her depression has been increasing.  She  has been compliant with her medications and follow-up.  Her sleep has been  increased.  She states she does not like bathing or doing other ADLs.  Her  appetite has been satisfactory.  Her other stressors include that she is an  Charity fundraiser on medical leave and she has had 2 siblings who have passed recently.   PAST PSYCHIATRIC HISTORY:  Second hospitalization.  Was here about 2 years  ago.  She has a history of ECT with 8 treatments in February of 2003 at  [facility].  Has a history of overdosing 2  years ago.  Her  psychiatrist is [name].  She also has a therapist, Darrold Junker.   SOCIAL HISTORY:  She is a 55 year old married African-American female,  married for 24 years, first marriage.  Has 2 daughters, 80 and 42 years of  age.  She lives with her husband.  Both of her children still live with  them.  She has a supportive family.  She is on medical leave for 2-1/2  years.  The patient is a Designer, jewellery, and she reports no legal  problems.   FAMILY HISTORY:  Denies any psychiatric history.   ALCOHOL DRUG HISTORY:  Nonsmoker, denies any alcohol or drug use.   PAST MEDICAL HISTORY:  Primary care Luisantonio Adinolfi is Dr. Kevan Ny.  Medical problems  are hypertension  which is under control.   MEDICATIONS:  Ambien 10 mg q.h.s., Wellbutrin XL 450 mg, has been on that  for 2 years, Prem-Pro 2.5 mg daily, Diovan/ hydrochlorothiazide 150/12.5,  Vistaril 50 p.r.n. q.6 hours, Xanax 2 mg takes 1 approximately within a 24  hour period, Lamictal 100 mg daily, has been on that for 1 year, Geodon 80  b.i.d., Lexapro 30 mg.   DRUG ALLERGIES:  ASPIRIN, CODEINE, AMOXICILLIN, COGENTIN.   PHYSICAL EXAMINATION:  Physical examination was performed.  The patient is a  well-nourished, well-developed middle-age female in no acute distress.  She  is a little bit anxious.  Her vital signs 99, 89 heart rate, 20  respirations.  Blood pressure is 130/81.  Nonfocal neurological findings.   LABORATORY DATA:  CBC within normal limits.  CMET within normal limits.  TSH  is 0.864.   MENTAL STATUS EXAM:  Alert, middle-aged, neatly dressed female, good eye  contact.  Speech is clear and articulate.  Mood is anxious, affect is  anxious.  The patient is very worried over her depression.  She denies any  psychotic symptoms.  Her cognitive function is intact, memory is good,  judgment and insight are good.  Appears very sincere.   ADMISSION DIAGNOSES:   AXIS I:  1. Major depressive disorder.  2. Rule out generalized anxiety disorder.   AXIS II:  Deferred.   AXIS III:  Hypertension.   AXIS IV:  Grief due to recent deaths, occupation and medical problems, other  psychosocial problems.   AXIS V:  Current is 35, past year 54-70.   PLAN:  Voluntary admission for depression and suicidal thoughts.  Contract  for safety, check every 15 minutes.  Will resume her medications, will have  the patient to attend groups.  Will contact family for support, consider ECT  if the patient does not improve.  The patient is to follow up with [name]  and her therapist after discharge.   TENTATIVE LENGTH OF CARE:  4-6 days depending on patient's response to  medication.     Landry Corporal,  N.P.                       Jeanice Lim, M.D.    JO/MEDQ  D:  04/22/2003  T:  04/22/2003  Job:  [id number]

## 2010-08-20 NOTE — Discharge Summary (Signed)
[facility]  Patient:    Deanna Rowe, Deanna Rowe                                 Visit Number: [id number] MRN: [id number]          Service Type: PSY Location: [facility] Attending:  Benjaman Pott Adm. Date:  [id number] Disc. Date: [id number]                             Discharge Summary  INTRODUCTION:  Deanna Rowe is a 55 year old married black female who was admitted on a voluntary basis because of depression with suicidal ideations. She reported increased crying, decreased concentration, decreased sleep with initial and middle insomnia.  Things started getting gradually worse up to the point that the patient was not able to function professionally.  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  Medically, she has a history of hypertension, migraine headaches, and a history of knee problems.  Most recently, she was on Valium 5 mg twice a day, Zoloft 50 mg daily, Midrin p.r.n., and Verapamil 250 mg one time a day.  INITIAL PHYSICAL EXAMINATION:  GENERAL:  Essentially normal with stable vital signs.  In no physical distress.  No gross abnormalities, physical or neurological, were found.  HOSPITAL COURSE:  After admission to the ward, the patient was placed on special observation.  She was restarted on Valium 5 mg twice a day, Zoloft 50 mg at night, Verapamil 250 mg daily, and Midrin two tablets p.r.n.  On June 28, the patients medications were slightly adjusted.  Zyprexa was introduced in the dose of 2.5 mg at bed time due to racing thoughts, insomnia, inability to concentrate.  On June 29, she was seen on-call by Dr. Dub Mikes who further increased Zoloft and Zyprexa respectively to 100 mg and 5 mg at bed time. Family session was held with the patients husband and sister.  The patient remained depressed primarily due to the recent death of her brother and sister suffering from a terminal illness.  The patient had a very  negativistic outlook on her life and a very pessimistic vision of the future.  In spite of treatment, she remained suicidal and despondent.  On July 1, I decided to start the patient on Wellbutrin in addition to Zoloft.  Zoloft was gradually increased to 150 mg daily.  For the next few days, we adjusted the dose of Zyprexa, at first down to 5 mg, later back to 10 mg at bed time since on a lower dose, racing thoughts, insomnia, and mental agitation had increased again.  On July 4, it was the first morning without suicidal thoughts and I had a series of meetings with the patient and her husband and we both felt that it would be safe to discharge the patient home, providing that she return to intensive outpatient treatment that she used to be in before.  If no improvement in the future, electroconvulsive therapy would be the option. Overall, the patient made some improvement.  MEDICAL PROBLEMS:  There was some relational blood pressure at the beginning of hospitalization but on the day of discharge, blood pressure was 116/76, normal pulse, respiratory rate, and temperature.  CBC was normal.  Chem 17: Normal.  Thyroid profile: Normal.  Drug screen: Negative for substances of abuse.  Urinalysis: Normal.  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 Axis III:  Arterial hypertension. Axis IV:   Psychosocial stressors are moderate to severe; illness in family. Axis V:    Global assessment of functioning upon admission was 30, maximum for            the past year 80, upon discharge 65.  DISCHARGE MEDICATIONS: 1. Zyprexa 5 mg two tablets at bed time and a half tablet as needed for racing    thoughts and anxiety. 2. Valium 5 mg twice a day. 3. Wellbutrin SR twice a day. 4. Zoloft 150 mg in the morning. 5. Verapamil 250 mg at  night.  FOLLOWUP: 1. The patient was discharged to the Intensive Outpatient Program at [facility]. 2. She is also supposed to follow up with her family physician on Friday, July    5. 3. She also has an appointment with an individual therapist, which she is    going to utilize after being discharged from the Intensive Outpatient    Program.  DISCHARGE INSTRUCTIONS:  The patient understood instructions.  Side effects of medications were explained to her.  At the time of discharge, she did not have any side effects from medications.  It was my feeling that she should stay off work for at least a few weeks in order to fully recuperate from her depression.  If depression recurs in spite of this intensive treatment, I would strongly recommend electroconvulsive therapy to help her with intractable depression.  Neither the patient nor her husband were, at the time of hospitalization, interested in ECT option. DD:  11/16/00 TD:  11/17/00 Job: [id number]

## 2010-08-20 NOTE — Discharge Summary (Signed)
[facility]  Patient:    Deanna Rowe, Deanna Rowe Visit Number: [id number] MRN: [id number]          Service Type: PSY Location: [facility] Attending Physician:  Benjaman Pott Dictated by:   Reymundo Poll Dub Mikes, M.D. Admit Date:  12/18/2000 Discharge Date: 12/29/2000                             Discharge Summary  CHIEF COMPLAINT AND PRESENT ILLNESS:  This was the second admission to [facility] for this 55 year old female, who was admitted on a voluntary basis due to suicidal ideation.  She was hospitalized at [facility] a month prior to this admission with a history of major depression.  Recently experienced worsening of her symptoms with increased anxiety, depression and multiple neurovegetative signs and symptoms, anergia, anhedonia, decreased appetite, escalating suicidal ideation and plan to overdose on Tylenol prior to this admission.  She has also attempted to take too many Valium before her husband took this from her.  PAST PSYCHIATRIC HISTORY:  Hospitalized at [facility] a month prior to this admission and was at [facility] Intensive Outpatient Program.  She is being followed by [name] at [facility].  MEDICATIONS:  Upon admission, she was taking Zoloft 200 mg per day, Zyprexa 10 mg at bedtime, Wellbutrin 150 mg twice a day, Valium 10 mg twice a day and 5 mg at noon, Ambien 10 mg at bedtime.  SUBSTANCE ABUSE HISTORY:  Denies the use or abuse of any substances.  MENTAL STATUS EXAMINATION:  Upon admission, well-nourished, well-developed, cooperative female.  Mood of depression.  Affect of depression.  Tends to ruminate about the things that happen in the past, the death of her sister. Intermittent eye contact.  Speech soft and slow.  Mood anxious and depressed. Affect consistent with mood, constricted.  No suicidal or homicidal ideation. No psychosis or perceptual disturbance.  Continual image in her mind of her sister,  who recently died from complications from MS.  Thought processes were slow and, at times, disorganized.  Overall, logical and goal directed. Cognition was well-preserved.  ADMITTING DIAGNOSES: Axis I:    1. Major depression, recurrent.            2. Anxiety disorder not otherwise specified. Axis II:   Deferred. Axis III:  1. Hypertension.            2. Migraines. Axis IV:   Moderate. Axis V:    Global Assessment of Functioning upon admission 40-45; highest            Global Assessment of Functioning in the last year 60-65.  LABORATORY DATA:  Serum electrolytes and liver profile were within normal limits.  Thyroid profile was within normal limits.  HOSPITAL COURSE:  She was admitted and started intensive individual and group psychotherapy.  She did show evidence of increased evidence of depression, irritability, anger spells.  We discussed options.  We went ahead and started lithium 300 mg twice a day.  She tolerated the lithium well with no side effects and was willing to pursue it further.  Issues in terms of the husband and his not understanding the depression.  He felt that he was angry and frustrated because she was not getting any better.  As the hospitalization progressed, did see a decrease in the thoughts about her sister.  There was a family session, where husband was able to ventilate his frustration over her  not getting better.  The staff helped him understand the course of depression. During the stay, there were episodes where the patient start thinking about her sister with no apparent trigger, became very tearful.  When she thought about the sister, she was thinking that she would die herself.  Lithium was increased to 1 in the morning and 2 at night.  Level was 0.4.  The Wellbutrin was increased to 200 mg twice a day.  On December 16, 2000, said she was much better.  She slept well.  Appetite was back to normal.  No suicidal ideation. Had tolerated the medication  well.  She felt safe enough that she could be discharged to IOP to pursue further treatment.  DISCHARGE DIAGNOSES: Axis I:    1. Major depression, recurrent, severe.            2. Anxiety disorder not otherwise specified. Axis II:   No diagnosis. Axis III:  1. Migraine.            2. Hypertension. Axis IV:   Moderate. Axis V:    Global Assessment of Functioning upon discharge 50-55.  DISCHARGE MEDICATIONS: 1. Wellbutrin SR 200 mg, 1 twice a day. 2. Lithobid 300 mg, 1 in the morning and 2 at night. 3. Zyprexa 15 mg at bedtime. 4. Valium 10 mg three times a day. 5. Zoloft 200 mg per day. 6. Ambien 10 mg at bedtime as needed for sleep. 7. Verapamil SR 240 mg at bedtime.  FOLLOW-UP:  Continue follow-up in the intensive outpatient program at [facility]. Dictated by:   Reymundo Poll Dub Mikes, M.D. Attending Physician:  Carolanne Grumbling D DD:  01/17/01 TD:  01/20/01 Job: [id number]

## 2010-12-09 ENCOUNTER — Emergency Department (HOSPITAL_BASED_OUTPATIENT_CLINIC_OR_DEPARTMENT_OTHER)
Admission: EM | Admit: 2010-12-09 | Discharge: 2010-12-09 | Disposition: A | Discharge status: Home / Self Care | Payer: No Typology Code available for payment source | Attending: Emergency Medicine | Admitting: Emergency Medicine

## 2010-12-09 ENCOUNTER — Emergency Department (INDEPENDENT_AMBULATORY_CARE_PROVIDER_SITE_OTHER): Payer: No Typology Code available for payment source

## 2010-12-09 DIAGNOSIS — M25569 Pain in unspecified knee: Secondary | ICD-10-CM

## 2010-12-09 DIAGNOSIS — M542 Cervicalgia: Secondary | ICD-10-CM | POA: Insufficient documentation

## 2010-12-09 DIAGNOSIS — Z8679 Personal history of other diseases of the circulatory system: Secondary | ICD-10-CM | POA: Insufficient documentation

## 2010-12-09 DIAGNOSIS — Y92410 Unspecified street and highway as the place of occurrence of the external cause: Secondary | ICD-10-CM | POA: Insufficient documentation

## 2010-12-09 DIAGNOSIS — I10 Essential (primary) hypertension: Secondary | ICD-10-CM | POA: Insufficient documentation

## 2010-12-09 DIAGNOSIS — M549 Dorsalgia, unspecified: Secondary | ICD-10-CM | POA: Insufficient documentation

## 2010-12-09 DIAGNOSIS — M545 Low back pain, unspecified: Secondary | ICD-10-CM

## 2010-12-09 DIAGNOSIS — M47812 Spondylosis without myelopathy or radiculopathy, cervical region: Secondary | ICD-10-CM

## 2010-12-09 DIAGNOSIS — E119 Type 2 diabetes mellitus without complications: Secondary | ICD-10-CM | POA: Insufficient documentation

## 2010-12-09 HISTORY — DX: Essential (primary) hypertension: I10

## 2010-12-09 MED ORDER — HYDROCODONE-ACETAMINOPHEN 5-500 MG PO TABS: 1.0000 | ORAL_TABLET | Freq: Four times a day (QID) | ORAL | Status: AC | PRN
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
## 2011-04-20 DIAGNOSIS — J029 Acute pharyngitis, unspecified: Secondary | ICD-10-CM | POA: Diagnosis not present

## 2011-05-02 DIAGNOSIS — F331 Major depressive disorder, recurrent, moderate: Secondary | ICD-10-CM | POA: Diagnosis not present

## 2011-06-15 ENCOUNTER — Encounter: Payer: 59 | Attending: Internal Medicine

## 2011-06-15 NOTE — Progress Notes (Signed)
  Patient was seen on 06/15/2011 for the first of a series of three diabetes self-management courses at the [facility]. The following learning objectives were met by the patient during this course:   Defines the role of glucose and insulin  Identifies type of diabetes and pathophysiology  Defines the diagnostic criteria for diabetes and prediabetes  States the risk factors for Type 2 Diabetes  States the symptoms of Type 2 Diabetes  Defines Type 2 Diabetes treatment goals  Defines Type 2 Diabetes treatment options  States the rationale for glucose monitoring  Identifies A1C, glucose targets, and testing times  Identifies proper sharps disposal  Defines the purpose of a diabetes food plan  Identifies carbohydrate food groups  Defines effects of carbohydrate foods on glucose levels  Identifies carbohydrate choices/grams/food labels  States benefits of physical activity and effect on glucose  Review of suggested activity guidelines  Handouts given during class include:  Type 2 Diabetes: Basics Book  My Food Plan Book  Food and Activity Log  Patient has established the following initial goals:  Increase my exercise  Follow a diabetes meal plan  Lose Weight   Follow-Up Plan: Plan to attend Core Classes 2 and 3.

## 2011-07-14 ENCOUNTER — Encounter: Payer: 59 | Attending: Internal Medicine | Admitting: *Deleted

## 2011-07-15 ENCOUNTER — Encounter: Payer: Self-pay | Admitting: *Deleted

## 2011-07-15 NOTE — Progress Notes (Signed)
  Patient was seen on 07/14/2011 for the second of a series of three diabetes self-management courses at the [facility]. The following learning objectives were met by the patient during this course:   Explain basic nutrition maintenance and quality assurance  Describe causes, symptoms and treatment of hypoglycemia and hyperglycemia  Explain how to manage diabetes during illness  Describe the importance of good nutrition for health and healthy eating strategies  List strategies to follow meal plan when dining out  Describe the effects of alcohol on glucose and how to use it safely  Describe problem solving skills for day-to-day glucose challenges  Describe strategies to use when treatment plan needs to change  Identify important factors involved in successful weight loss  Describe ways to remain physically active  Describe the impact of regular activity on insulin resistance  Handouts given in class:  Refrigerator magnet for Sick Day Guidelines  [facility] Oral medication/insulin handout  Follow-Up Plan: Patient will attend the final class of the ADA Diabetes Self-Care Education.    

## 2011-07-21 ENCOUNTER — Encounter: Payer: Self-pay | Admitting: *Deleted

## 2011-07-21 ENCOUNTER — Encounter: Payer: 59 | Admitting: *Deleted

## 2011-07-21 NOTE — Progress Notes (Signed)
Patient was seen on 07/21/2011 for the third of a series of three diabetes self-management courses at the [facility]. The following learning objectives were met by the patient during this course:    Describe how diabetes changes over time   Identify diabetes complications and ways to prevent them   Describe strategies that can promote heart health including lowering blood pressure and cholesterol   Describe strategies to lower dietary fat and sodium in the diet   Identify physical activities that benefit cardiovascular health   Evaluate success in meeting personal goal   Describe the belief that they can live successfully with diabetes day to day   Establish 2-3 goals that they will plan to diligently work on until they return for the free 39-month follow-up visit  The following handouts were given in class:  3 Month Follow Up Visit handout  Goal setting handout  Class evaluation form  Your patient has established the following 3 month goals for diabetes self-care:  Count carbs at most of my meals, reduce fat in diet by eating less butter   Look for patterns in my record book every day  Test my glucose at least 3 times a day, 7 days a week including after supper on occasion  Follow-Up Plan: Patient will attend a 3 month follow-up visit for diabetes self-management education.

## 2011-08-19 DIAGNOSIS — R51 Headache: Secondary | ICD-10-CM | POA: Diagnosis not present

## 2011-08-19 DIAGNOSIS — M542 Cervicalgia: Secondary | ICD-10-CM | POA: Diagnosis not present

## 2011-08-19 DIAGNOSIS — M25559 Pain in unspecified hip: Secondary | ICD-10-CM | POA: Diagnosis not present

## 2011-08-22 DIAGNOSIS — R300 Dysuria: Secondary | ICD-10-CM | POA: Diagnosis not present

## 2011-10-11 DIAGNOSIS — F331 Major depressive disorder, recurrent, moderate: Secondary | ICD-10-CM | POA: Diagnosis not present

## 2011-10-20 ENCOUNTER — Encounter: Payer: 59 | Attending: Internal Medicine | Admitting: *Deleted

## 2011-10-20 ENCOUNTER — Encounter: Payer: Self-pay | Admitting: *Deleted

## 2011-10-20 VITALS — Ht 66.0 in | Wt 213.4 lb

## 2011-10-20 DIAGNOSIS — Z713 Dietary counseling and surveillance: Secondary | ICD-10-CM | POA: Insufficient documentation

## 2011-10-20 DIAGNOSIS — E119 Type 2 diabetes mellitus without complications: Secondary | ICD-10-CM | POA: Insufficient documentation

## 2011-10-20 NOTE — Patient Instructions (Addendum)
Goals:  Follow Diabetes Meal Plan as instructed  Eat 3 meals and 2 snacks, every 3-5 hrs  Limit carbohydrate intake to 30-45 grams carbohydrate/meal  Limit carbohydrate intake to 0-15 grams carbohydrate/snack  Add lean protein foods to meals/snacks  Monitor glucose levels as instructed by your doctor  Aim for 15-30 mins of physical activity daily as tolerated  Bring food record and glucose log to your next nutrition visit 

## 2011-10-20 NOTE — Progress Notes (Signed)
Patient was seen on 10/20/2011 for their 3 month follow-up as a part of the diabetes self-management courses at the [facility]. The following learning objectives were met by your patient during this course:  Patient self reports the following:  Diabetes control has improved since diabetes self-management training: yes Number of days blood glucose is >200: less than once a week Last MD appointment for diabetes: before 1st class in March, 2013 Changes in treatment plan: more aware of and able to manage carb choices and is using less fat Confidence with ability to manage diabetes: excellent Areas for improvement with diabetes self-care: continue with current meal plan and activity level Willingness to participate in diabetes support group: no   Please see Diabetes Flow sheet for findings related to patient's self-care.  Follow-Up Plan: Patient is eligible for a "free" 30 minute diabetes self-care appointment in the next year. Patient to call and schedule as needed.

## 2011-10-26 DIAGNOSIS — M1610 Unilateral primary osteoarthritis, unspecified hip: Secondary | ICD-10-CM | POA: Diagnosis not present

## 2011-10-26 DIAGNOSIS — M169 Osteoarthritis of hip, unspecified: Secondary | ICD-10-CM | POA: Diagnosis not present

## 2011-11-03 ENCOUNTER — Ambulatory Visit: Payer: 59 | Attending: Orthopedic Surgery | Admitting: Rehabilitation

## 2011-11-03 DIAGNOSIS — M6281 Muscle weakness (generalized): Secondary | ICD-10-CM | POA: Diagnosis not present

## 2011-11-03 DIAGNOSIS — IMO0001 Reserved for inherently not codable concepts without codable children: Secondary | ICD-10-CM | POA: Diagnosis not present

## 2011-11-03 DIAGNOSIS — M25669 Stiffness of unspecified knee, not elsewhere classified: Secondary | ICD-10-CM | POA: Diagnosis not present

## 2011-11-03 DIAGNOSIS — M25569 Pain in unspecified knee: Secondary | ICD-10-CM | POA: Insufficient documentation

## 2011-11-03 DIAGNOSIS — R269 Unspecified abnormalities of gait and mobility: Secondary | ICD-10-CM | POA: Insufficient documentation

## 2011-11-07 ENCOUNTER — Ambulatory Visit: Payer: 59 | Admitting: Rehabilitation

## 2011-11-09 ENCOUNTER — Ambulatory Visit: Payer: 59 | Admitting: Rehabilitation

## 2011-11-21 ENCOUNTER — Ambulatory Visit: Payer: 59 | Admitting: Rehabilitation

## 2011-11-21 DIAGNOSIS — N6099 Unspecified benign mammary dysplasia of unspecified breast: Secondary | ICD-10-CM | POA: Diagnosis not present

## 2011-11-22 DIAGNOSIS — M1610 Unilateral primary osteoarthritis, unspecified hip: Secondary | ICD-10-CM | POA: Diagnosis not present

## 2011-11-22 DIAGNOSIS — M169 Osteoarthritis of hip, unspecified: Secondary | ICD-10-CM | POA: Diagnosis not present

## 2011-11-25 ENCOUNTER — Ambulatory Visit: Payer: 59 | Admitting: Rehabilitation

## 2011-11-28 ENCOUNTER — Ambulatory Visit: Payer: 59 | Admitting: Rehabilitation

## 2011-11-30 ENCOUNTER — Ambulatory Visit: Payer: 59 | Admitting: Rehabilitation

## 2011-12-06 DIAGNOSIS — M169 Osteoarthritis of hip, unspecified: Secondary | ICD-10-CM | POA: Diagnosis not present

## 2011-12-06 DIAGNOSIS — M1610 Unilateral primary osteoarthritis, unspecified hip: Secondary | ICD-10-CM | POA: Diagnosis not present

## 2011-12-08 ENCOUNTER — Ambulatory Visit: Payer: 59 | Attending: Orthopedic Surgery | Admitting: Rehabilitation

## 2011-12-08 DIAGNOSIS — M25669 Stiffness of unspecified knee, not elsewhere classified: Secondary | ICD-10-CM | POA: Diagnosis not present

## 2011-12-08 DIAGNOSIS — R269 Unspecified abnormalities of gait and mobility: Secondary | ICD-10-CM | POA: Insufficient documentation

## 2011-12-08 DIAGNOSIS — M6281 Muscle weakness (generalized): Secondary | ICD-10-CM | POA: Diagnosis not present

## 2011-12-08 DIAGNOSIS — IMO0001 Reserved for inherently not codable concepts without codable children: Secondary | ICD-10-CM | POA: Diagnosis not present

## 2011-12-08 DIAGNOSIS — M25569 Pain in unspecified knee: Secondary | ICD-10-CM | POA: Diagnosis not present

## 2011-12-12 ENCOUNTER — Ambulatory Visit: Payer: 59 | Admitting: Rehabilitation

## 2011-12-12 DIAGNOSIS — IMO0001 Reserved for inherently not codable concepts without codable children: Secondary | ICD-10-CM | POA: Diagnosis not present

## 2011-12-12 DIAGNOSIS — M25669 Stiffness of unspecified knee, not elsewhere classified: Secondary | ICD-10-CM | POA: Diagnosis not present

## 2011-12-12 DIAGNOSIS — M6281 Muscle weakness (generalized): Secondary | ICD-10-CM | POA: Diagnosis not present

## 2011-12-12 DIAGNOSIS — R269 Unspecified abnormalities of gait and mobility: Secondary | ICD-10-CM | POA: Diagnosis not present

## 2011-12-12 DIAGNOSIS — M25569 Pain in unspecified knee: Secondary | ICD-10-CM | POA: Diagnosis not present

## 2011-12-14 ENCOUNTER — Ambulatory Visit: Payer: 59 | Admitting: Rehabilitation

## 2011-12-14 DIAGNOSIS — IMO0001 Reserved for inherently not codable concepts without codable children: Secondary | ICD-10-CM | POA: Diagnosis not present

## 2011-12-14 DIAGNOSIS — M6281 Muscle weakness (generalized): Secondary | ICD-10-CM | POA: Diagnosis not present

## 2011-12-14 DIAGNOSIS — M25569 Pain in unspecified knee: Secondary | ICD-10-CM | POA: Diagnosis not present

## 2011-12-14 DIAGNOSIS — R269 Unspecified abnormalities of gait and mobility: Secondary | ICD-10-CM | POA: Diagnosis not present

## 2011-12-14 DIAGNOSIS — M25669 Stiffness of unspecified knee, not elsewhere classified: Secondary | ICD-10-CM | POA: Diagnosis not present

## 2011-12-19 ENCOUNTER — Ambulatory Visit: Payer: 59 | Admitting: Rehabilitation

## 2011-12-19 DIAGNOSIS — M25669 Stiffness of unspecified knee, not elsewhere classified: Secondary | ICD-10-CM | POA: Diagnosis not present

## 2011-12-19 DIAGNOSIS — M25569 Pain in unspecified knee: Secondary | ICD-10-CM | POA: Diagnosis not present

## 2011-12-19 DIAGNOSIS — IMO0001 Reserved for inherently not codable concepts without codable children: Secondary | ICD-10-CM | POA: Diagnosis not present

## 2011-12-19 DIAGNOSIS — R269 Unspecified abnormalities of gait and mobility: Secondary | ICD-10-CM | POA: Diagnosis not present

## 2011-12-19 DIAGNOSIS — M6281 Muscle weakness (generalized): Secondary | ICD-10-CM | POA: Diagnosis not present

## 2011-12-21 ENCOUNTER — Ambulatory Visit: Payer: 59 | Admitting: Rehabilitation

## 2011-12-21 DIAGNOSIS — IMO0001 Reserved for inherently not codable concepts without codable children: Secondary | ICD-10-CM | POA: Diagnosis not present

## 2011-12-21 DIAGNOSIS — R269 Unspecified abnormalities of gait and mobility: Secondary | ICD-10-CM | POA: Diagnosis not present

## 2011-12-21 DIAGNOSIS — M25569 Pain in unspecified knee: Secondary | ICD-10-CM | POA: Diagnosis not present

## 2011-12-21 DIAGNOSIS — M6281 Muscle weakness (generalized): Secondary | ICD-10-CM | POA: Diagnosis not present

## 2011-12-21 DIAGNOSIS — M25669 Stiffness of unspecified knee, not elsewhere classified: Secondary | ICD-10-CM | POA: Diagnosis not present

## 2011-12-26 ENCOUNTER — Ambulatory Visit: Payer: 59 | Admitting: Rehabilitation

## 2011-12-26 DIAGNOSIS — IMO0001 Reserved for inherently not codable concepts without codable children: Secondary | ICD-10-CM | POA: Diagnosis not present

## 2011-12-26 DIAGNOSIS — M25669 Stiffness of unspecified knee, not elsewhere classified: Secondary | ICD-10-CM | POA: Diagnosis not present

## 2011-12-26 DIAGNOSIS — M25569 Pain in unspecified knee: Secondary | ICD-10-CM | POA: Diagnosis not present

## 2011-12-26 DIAGNOSIS — M6281 Muscle weakness (generalized): Secondary | ICD-10-CM | POA: Diagnosis not present

## 2011-12-26 DIAGNOSIS — R269 Unspecified abnormalities of gait and mobility: Secondary | ICD-10-CM | POA: Diagnosis not present

## 2011-12-28 ENCOUNTER — Ambulatory Visit: Payer: 59 | Admitting: Rehabilitation

## 2011-12-28 DIAGNOSIS — IMO0001 Reserved for inherently not codable concepts without codable children: Secondary | ICD-10-CM | POA: Diagnosis not present

## 2011-12-28 DIAGNOSIS — M25569 Pain in unspecified knee: Secondary | ICD-10-CM | POA: Diagnosis not present

## 2011-12-28 DIAGNOSIS — R269 Unspecified abnormalities of gait and mobility: Secondary | ICD-10-CM | POA: Diagnosis not present

## 2011-12-28 DIAGNOSIS — M6281 Muscle weakness (generalized): Secondary | ICD-10-CM | POA: Diagnosis not present

## 2011-12-28 DIAGNOSIS — M25669 Stiffness of unspecified knee, not elsewhere classified: Secondary | ICD-10-CM | POA: Diagnosis not present

## 2012-01-19 DIAGNOSIS — E119 Type 2 diabetes mellitus without complications: Secondary | ICD-10-CM | POA: Diagnosis present

## 2012-01-19 DIAGNOSIS — F333 Major depressive disorder, recurrent, severe with psychotic symptoms: Secondary | ICD-10-CM | POA: Diagnosis not present

## 2012-01-19 DIAGNOSIS — F332 Major depressive disorder, recurrent severe without psychotic features: Secondary | ICD-10-CM | POA: Diagnosis present

## 2012-01-19 DIAGNOSIS — Z8673 Personal history of transient ischemic attack (TIA), and cerebral infarction without residual deficits: Secondary | ICD-10-CM | POA: Diagnosis not present

## 2012-01-19 DIAGNOSIS — I10 Essential (primary) hypertension: Secondary | ICD-10-CM | POA: Diagnosis present

## 2012-01-19 DIAGNOSIS — R45851 Suicidal ideations: Secondary | ICD-10-CM | POA: Diagnosis not present

## 2012-02-11 ENCOUNTER — Ambulatory Visit (HOSPITAL_COMMUNITY)
Admission: RE | Admit: 2012-02-11 | Discharge: 2012-02-11 | Disposition: A | Discharge status: Home / Self Care | Payer: 59 | Attending: Psychiatry | Admitting: Psychiatry

## 2012-02-11 ENCOUNTER — Encounter (HOSPITAL_COMMUNITY): Payer: Self-pay | Admitting: *Deleted

## 2012-02-11 DIAGNOSIS — F332 Major depressive disorder, recurrent severe without psychotic features: Secondary | ICD-10-CM | POA: Insufficient documentation

## 2012-02-11 DIAGNOSIS — I639 Cerebral infarction, unspecified: Secondary | ICD-10-CM | POA: Insufficient documentation

## 2012-02-12 NOTE — BH Assessment (Signed)
Assessment Note   Deanna Rowe is a 56 y.o. married black female.  She reports that she was referred by her therapist, [name], to be considered for MH-IOP.  Following an 8 day admission to [name] in 01/2012, pt has been at home for the past 1.5 weeks.  She was admitted for SI, which she does not currently endorse, but she remains overwhelmingly depressed.  "I'm sad all the time.  I'm not happy, and I question my self worth."  Pt reports that immediately prior to admission she had SI.  She profoundly considered overdosing, and feels that she would have followed through on these thoughts if her 20 y/o daughter had not been in the home at the time.  She has a history of one suicide attempt by overdose in 08-Sep-2002 precipitated by the death of her sister to multiple sclerosis in 09-07-00.  She reports that she has again been ruminating about this loss, which contributed to her recent SI.  She cannot explain why this is so.  She also reports a recent medication change, reducing her dosage of Abilify to 2.5 mg QHS, after she developed diabetes and tardive dyskinesia.  While pt denies SI currently and is able to contract for safety, she continues to endorse depression with symptoms as indicated in the "risk to self" assessment below.  She reports that she was not even able to enjoy her birthday celebration a few days ago.  She denies HI or violent behavior.  She denies AH/VH, and exhibits no delusional thought.  She denies any history of substance abuse.  Pt is a former Charity fundraiser, but stopped working full time in 09/07/2000 following her suicide attempt.  She has worked at [employer] and [employer] in the past.  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  She misses working as a Engineer, civil (consulting).  She identifies her adult daughters, her sisters, and a close friend as social supports.  Her spouse is not abusive, but does not understand her mental illness.  While at [facility]  he visited on one occasion and told her that she should be ashamed of being hospitalized.  In addition to the recent admission to [facility], pt was also hospitalized at [facility] in [place] before September 08, 1998, and at [facility] in Sep 07, 2005, where she received ECT.  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  She has seen Ellamae Sia, MD for psychiatry since September 07, 2000, and [name] for therapy since 2003/09/08.  Pt is calm and cooperative, but very depressed with constricted affect during assessment.  She makes no eye contact, gazing at the floor instead.  She wants to be enrolled in MH-IOP, and adds that Lupita Leash has already contacted Jeri Modena, but does not know if a projected start date has been scheduled.  Axis I: Major Depressive Disorder, recurrent, severe, without psychotic features 296.33 Axis II: Deferred 799.9 Axis III:  Past Medical History  Diagnosis Date  . Diabetes mellitus   . Hypertension   . Asthma   . Obesity   . Stroke 02/11/2012    Pt reports Hx of TIA's   Axis IV: problems with primary support group and problems related to grieving Axis V: GAF = 43  Past Medical History:  Past Medical History  Diagnosis Date  . Diabetes mellitus   . Hypertension   . Asthma   . Obesity   . Stroke 02/11/2012  Pt reports Hx of TIA's    Past Surgical History  Procedure Date  . Hip surgery   . Dilation and curettage of uterus   . Pilonidal cyst drainage     Family History: No family history on file.  Social History:  reports that she has never smoked. She does not have any smokeless tobacco history on file. She reports that she does not drink alcohol or use illicit drugs.  Additional Social History:  Alcohol / Drug Use Pain Medications: Denies Prescriptions: Denies Over the Counter: Denies History of alcohol / drug use?: No history of alcohol / drug abuse  CIWA:   COWS:    Allergies:  Allergies  Allergen Reactions  . Codeine Nausea And Vomiting  .  Vicodin (Hydrocodone-Acetaminophen)   . Amoxicillin Rash    Home Medications:  (Not in a hospital admission)  OB/GYN Status:  No LMP recorded. Patient is not currently having periods (Reason: Perimenopausal).  General Assessment Data Location of Assessment: [facility] Assessment Services Living Arrangements: Spouse/significant other;Children (Spouse, 29 y/o daughter) Can pt return to current living arrangement?: Yes Admission Status: Voluntary Is patient capable of signing voluntary admission?: Yes Transfer from: Home Referral Source: Other (Therapist: [name])  Education Status Is patient currently in school?: No Highest grade of school patient has completed: Pt earned BSN  Risk to self Suicidal Ideation: No Suicidal Intent: No Is patient at risk for suicide?: No Suicidal Plan?: No Access to Means: No What has been your use of drugs/alcohol within the last 12 months?: Denies Previous Attempts/Gestures: Yes How many times?: 1  (Overdose in 2004) Other Self Harm Risks: Admitted to [name] in 01/2012 for SI after strongly considering overdosing; today pt contracts for safety. Triggers for Past Attempts: Other (Comment) (Death of sister to Multiple Sclerosis) Intentional Self Injurious Behavior: None Family Suicide History: No Recent stressful life event(s): Conflict (Comment);Other (Comment) (Ruminating on sister's death; Spouse unsupportive of Tx need) Persecutory voices/beliefs?: No Depression: Yes Depression Symptoms: Tearfulness;Isolating;Fatigue;Guilt;Loss of interest in usual pleasures;Feeling worthless/self pity;Feeling angry/irritable (Hopelessness) Substance abuse history and/or treatment for substance abuse?: No Suicide prevention information given to non-admitted patients: Yes  Risk to Others Homicidal Ideation: No Thoughts of Harm to Others: No Current Homicidal Intent: No Current Homicidal Plan: No Access to Homicidal Means: No Identified Victim:  None History of harm to others?: No Assessment of Violence: None Noted Violent Behavior Description: Calm/cooperative/tearful Does patient have access to weapons?: No (Denies access to guns.) Criminal Charges Pending?: No Does patient have a court date: No  Psychosis Hallucinations: None noted Delusions: None noted  Mental Status Report Appear/Hygiene: Other (Comment) (Neat, well groomed) Eye Contact: Poor (Absent, downward gaze) Motor Activity: Other (Comment);Unremarkable (Gait is asymetrical; unremarkable while seated) Speech: Other (Comment) (Unremarkable) Level of Consciousness: Alert Mood: Depressed;Anxious Affect: Other (Comment) (Constricted) Anxiety Level: Moderate Thought Processes: Coherent;Relevant Judgement: Unimpaired Orientation: Person;Time;Place;Situation Obsessive Compulsive Thoughts/Behaviors: None  Cognitive Functioning Concentration: Decreased Memory: Recent Intact;Remote Impaired (Remote impairment due to history of ECT) IQ: Average Insight: Fair Impulse Control: Good Appetite: Fair Weight Loss: 0  Weight Gain: 0  Sleep: Decreased (Mid-insomnia x 3 weeks; Ambien helps) Total Hours of Sleep: 5  Vegetative Symptoms: None (Reduced housekeeping)  ADLScreening [facility] Assessment Services) Patient's cognitive ability adequate to safely complete daily activities?: Yes Patient able to express need for assistance with ADLs?: Yes Independently performs ADLs?: Yes (appropriate for developmental age)  Abuse/Neglect [facility]) Physical Abuse: Denies Verbal Abuse: Denies Sexual Abuse: Denies  Prior Inpatient Therapy Prior Inpatient Therapy:  Yes Prior Therapy Dates: 01/2012: [name] for SI Prior Therapy Facilty/Provider(s): 2007: [facility] Reason for Treatment: Prior to 2000: [facility], [place]  Prior Outpatient Therapy Prior Outpatient Therapy: Yes Prior Therapy Dates: 2002 - present: Ellamae Sia, MD for psychiatry Prior Therapy  Facilty/Provider(s): 2005 - present: [name] for therapy  ADL Screening (condition at time of admission) Patient's cognitive ability adequate to safely complete daily activities?: Yes Patient able to express need for assistance with ADLs?: Yes Independently performs ADLs?: Yes (appropriate for developmental age) Weakness of Legs: None (Gait is asymetrical due to hip problems) Weakness of Arms/Hands: None  Home Assistive Devices/Equipment Home Assistive Devices/Equipment: Eyeglasses;CBG Meter    Abuse/Neglect Assessment (Assessment to be complete while patient is alone) Physical Abuse: Denies Verbal Abuse: Denies Sexual Abuse: Denies Exploitation of patient/patient's resources: Denies Self-Neglect: Denies     Advance Directives (For Healthcare) Advance Directive: Patient does not have advance directive;Patient would not like information Pre-existing out of facility DNR order (yellow form or pink MOST form): No Nutrition Screen- [facility] Adult/WL/AP Patient's home diet: Carb modified Have you recently lost weight without trying?: No Have you been eating poorly because of a decreased appetite?: Yes (Intemittent decrease) Malnutrition Screening Tool Score: 1   Additional Information 1:1 In Past 12 Months?: No CIRT Risk: No Elopement Risk: No Does patient have medical clearance?: No     Disposition:  Disposition Disposition of Patient: Outpatient treatment Type of outpatient treatment: Psych Intensive Outpatient Discussed pt with Sh[place]on.  She agrees that pt will be safe in the community, provided that she is capable of contracting for safety, and that she would benefit from MH-IOP.  Pt signed a Engineer, manufacturing systems and accepted written information about MH-IOP.  She was instructed to call Jeri Modena as soon as possible to establish a start date.  She departed at 15:00.  On Site Evaluation by:   Reviewed with Physician:  Assunta Found, FNP @ 14:50   Raphael Gibney 02/12/2012 11:52 AM

## 2012-02-15 DIAGNOSIS — F331 Major depressive disorder, recurrent, moderate: Secondary | ICD-10-CM | POA: Diagnosis not present

## 2012-02-21 ENCOUNTER — Other Ambulatory Visit (HOSPITAL_COMMUNITY): Payer: 59 | Attending: Psychiatry | Admitting: Psychiatry

## 2012-02-21 ENCOUNTER — Encounter (HOSPITAL_COMMUNITY): Payer: Self-pay

## 2012-02-21 DIAGNOSIS — E669 Obesity, unspecified: Secondary | ICD-10-CM | POA: Diagnosis not present

## 2012-02-21 DIAGNOSIS — F332 Major depressive disorder, recurrent severe without psychotic features: Secondary | ICD-10-CM | POA: Diagnosis not present

## 2012-02-21 DIAGNOSIS — J45909 Unspecified asthma, uncomplicated: Secondary | ICD-10-CM | POA: Insufficient documentation

## 2012-02-21 DIAGNOSIS — I10 Essential (primary) hypertension: Secondary | ICD-10-CM | POA: Insufficient documentation

## 2012-02-21 DIAGNOSIS — F411 Generalized anxiety disorder: Secondary | ICD-10-CM | POA: Diagnosis not present

## 2012-02-21 DIAGNOSIS — Z8673 Personal history of transient ischemic attack (TIA), and cerebral infarction without residual deficits: Secondary | ICD-10-CM | POA: Insufficient documentation

## 2012-02-21 DIAGNOSIS — E119 Type 2 diabetes mellitus without complications: Secondary | ICD-10-CM | POA: Insufficient documentation

## 2012-02-21 DIAGNOSIS — F329 Major depressive disorder, single episode, unspecified: Secondary | ICD-10-CM

## 2012-02-21 DIAGNOSIS — F32A Depression, unspecified: Secondary | ICD-10-CM

## 2012-02-21 NOTE — Progress Notes (Signed)
Patient ID: Deanna Rowe, female   DOB: 1955/12/09, 56 y.o.   MRN: [id number] D:  This is a 56 year old married african [place] female, who was referred per therapist [name], [facility]), treatment for depressive and anxiety symptoms with SI.  Discussed safety options with pt.  Pt able to contract for safety.  Denies any A/V hallucinations.  According to pt, she was released from [name] 2 1/2 weeks ago.  Was admitted there due to worsening depressive symptoms, SI, with psychosis.  States she's been feeling depressed for eight weeks.  Stressors/Triggers:  1)  Medication decrease:  States she was on Abilify 5mg  and started having side effects.  Reports that [name] decreased it to 2.5mg , and then pt started feeling more depressed.  2)  Unresolved grief/loss issues.  In 2000-09-16 patient's sister died due to MS. Childhood:  "Happy"  Denies any trauma or abuse. Siblings:  Four brothers living, two deceased and three sisters living (younger has cerebral palsy) Pt resides with supportive husband of thirty three years marriage and 50 year old daughter.  Their oldest daughter is 52 years old, who suffers from depression. 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   Pt denies any drugs/ETOH.  Pt completed all forms.  Scored 35 on the burns. A:  Oriented pt.  Informed [name] of admit.  [name], [facility] is currently out of her office) Provided pt with an orientation folder.  R:  Pt receptive.

## 2012-02-21 NOTE — Progress Notes (Signed)
    Daily Group Progress Note  Program: IOP  Group Time: 9:00-10:30 am   Participation Level: Active  Behavioral Response: Appropriate  Type of Therapy:  Process Group  Summary of Progress: Today is patients first day in the group, but states she has been through the IOP program in the past. She was quiet and attentive as she observed the group process. Her main stressors stem from feelings of guilt over her sisters dead and over not being able to take care of family members the way she feels she should be able to. She also has the added stressor of her husband not understanding or supporting her with her depression.      Group Time: 10:30 am - 12:00 pm   Participation Level:  Active  Behavioral Response: Appropriate  Type of Therapy: Psycho-education Group  Summary of Progress: Patient was introduced to a stress management technique (Biofeedback) and was educated on the science behind the technique as a tool to manage anxiety symptoms.   Carman Ching, LCSW

## 2012-02-22 ENCOUNTER — Other Ambulatory Visit (HOSPITAL_COMMUNITY): Payer: 59 | Admitting: Psychiatry

## 2012-02-22 DIAGNOSIS — I10 Essential (primary) hypertension: Secondary | ICD-10-CM | POA: Diagnosis not present

## 2012-02-22 DIAGNOSIS — F332 Major depressive disorder, recurrent severe without psychotic features: Secondary | ICD-10-CM | POA: Diagnosis not present

## 2012-02-22 DIAGNOSIS — E669 Obesity, unspecified: Secondary | ICD-10-CM | POA: Diagnosis not present

## 2012-02-22 DIAGNOSIS — F411 Generalized anxiety disorder: Secondary | ICD-10-CM | POA: Diagnosis not present

## 2012-02-22 DIAGNOSIS — J45909 Unspecified asthma, uncomplicated: Secondary | ICD-10-CM | POA: Diagnosis not present

## 2012-02-22 DIAGNOSIS — F32A Depression, unspecified: Secondary | ICD-10-CM

## 2012-02-22 DIAGNOSIS — E119 Type 2 diabetes mellitus without complications: Secondary | ICD-10-CM | POA: Diagnosis not present

## 2012-02-22 DIAGNOSIS — F329 Major depressive disorder, single episode, unspecified: Secondary | ICD-10-CM

## 2012-02-22 NOTE — Progress Notes (Signed)
    Daily Group Progress Note  Program: IOP  Group Time: 9:00-10:30 am   Participation Level: Active  Behavioral Response: Appropriate  Type of Therapy:  Process Group  Summary of Progress: Patient was attentive but did not share again today. She expressed difficulty with talking without being asked questions. Members agreed to check in with her tomorrow. She presented with very high anxiety and states she struggles with ways to manage it.      Group Time: 10:30 am - 12:00 pm   Participation Level:  Active  Behavioral Response: Appropriate  Type of Therapy: Psycho-education Group  Summary of Progress: Patient learned additional stress managment skills (biofeedback) and practiced using this calming technique during the group to manage anxiety symptoms.   Carman Ching, LCSW

## 2012-02-23 ENCOUNTER — Other Ambulatory Visit (HOSPITAL_COMMUNITY): Payer: 59 | Admitting: Psychiatry

## 2012-02-23 ENCOUNTER — Ambulatory Visit (INDEPENDENT_AMBULATORY_CARE_PROVIDER_SITE_OTHER): Payer: Self-pay | Admitting: Family Medicine

## 2012-02-23 DIAGNOSIS — F411 Generalized anxiety disorder: Secondary | ICD-10-CM | POA: Diagnosis not present

## 2012-02-23 DIAGNOSIS — J45909 Unspecified asthma, uncomplicated: Secondary | ICD-10-CM | POA: Diagnosis not present

## 2012-02-23 DIAGNOSIS — E119 Type 2 diabetes mellitus without complications: Secondary | ICD-10-CM | POA: Diagnosis not present

## 2012-02-23 DIAGNOSIS — F329 Major depressive disorder, single episode, unspecified: Secondary | ICD-10-CM

## 2012-02-23 DIAGNOSIS — I10 Essential (primary) hypertension: Secondary | ICD-10-CM | POA: Diagnosis not present

## 2012-02-23 DIAGNOSIS — F32A Depression, unspecified: Secondary | ICD-10-CM

## 2012-02-23 DIAGNOSIS — F332 Major depressive disorder, recurrent severe without psychotic features: Secondary | ICD-10-CM | POA: Diagnosis not present

## 2012-02-23 DIAGNOSIS — E669 Obesity, unspecified: Secondary | ICD-10-CM | POA: Diagnosis not present

## 2012-02-23 NOTE — Progress Notes (Signed)
Patient presents today for Initial DM visit as part of employer-sponsored [employer] to [employer]. Medications, glucose readings, a1c and compliance have been reviewed. I have also discussed with patient lifestyle interventions such as diet and exercise. Details of the visit can be found in [facility] documenting program through [facility]). Patient has set a series of personal goals and will follow-up in no more than 3 months for further review of DM.  Specific focus today on hypoglycemic events and diet (advised reduction in total daily calories via reduction in carbohydrates while attempting to increase protein and fat slightly).

## 2012-02-23 NOTE — Progress Notes (Signed)
    Daily Group Progress Note  Program: IOP  Group Time: 9:00-10:30 am   Participation Level: Active  Behavioral Response: Appropriate  Type of Therapy:  Process Group  Summary of Progress: Patient was tearful as she shared the ongoing grief she has over the death of her sister (ten years ago) to M.S. She questioned why she can not accept this loss and move on and why it causes her depression to return at various points in her life. She shared that she never had closure with her sister before she died and wanted to tell her that she loved her and that she was sorry that she suffered. Patient was encouraged to think of how she needs to tell her sister these things and affectively grieve this loss. She agreed to give thought to ways she could due that during her time in the group.      Group Time: 10:30 am - 12:00 pm   Participation Level:  Active  Behavioral Response: Appropriate  Type of Therapy: Psycho-education Group  Summary of Progress:  Patient participated in a discussion on learning the skills of healthy boundary setting. Patient identified most with the "comliant personality" in a tendency to put others needs before their own and melt into the demands of others. Patient agreed to explore areas of stress in their life to bring to group tomorrow and explore the problem through the skills of boundary setting.   Carman Ching, LCSW

## 2012-02-23 NOTE — Progress Notes (Signed)
Patient ID: Deanna Rowe, female   DOB: 1955/12/23, 56 y.o.   MRN: [id number] Reviewed: Agree with documentation and management.

## 2012-02-23 NOTE — Progress Notes (Signed)
Psychiatric Assessment Adult  Patient Identification:  Deanna Rowe Date of Evaluation:  02/21/12 Chief Complaint: Depression, recent discharge from inpatient unit at [name] History of Chief Complaint:  56 year old married african [place] female, who was referred per therapist Deanna Rowe, Deanna Rowe), treatment for depressive and anxiety symptoms with SI. Discussed safety options with pt. Pt able to contract for safety. Denies any A/V hallucinations. According to pt, she was released from [facility] 2 1/2 weeks ago. Was admitted there due to worsening depressive symptoms, SI, with psychosis.while hospitalized her Lexapro was discontinued and she was started on Cymbalta 30 mg twice a day  States she's been feeling depressed for eight weeks. Stressors/Triggers: 1) Medication decrease: States she was on Abilify 5mg  and started having side effects. Reports that Deanna Rowe decreased it to 2.5mg , and then pt started feeling more depressed. 2) Unresolved grief/loss issues. In 08/16/2000 patient's sister died due to MS. At that time patient wants her caretaker. Her sister died of multi-organ shutdown and patient continues to blame herself and feels guilty about letting her sister die. States this current episode could have been triggered off by the death sisters son reaching out to her because of his child having problems. Childhood: "Happy" Denies any trauma or abuse.  Siblings: Four brothers living, two deceased and three sisters living (younger has cerebral palsy)  Pt resides with supportive husband of thirty three years marriage and 20 year old daughter. Their oldest daughter is 32 years old, who suffers from depression.  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  Pt denies any drugs/ETOH. Pt completed all forms. Scored 35 on the burns.   HPI Review of Systems Physical Exam  Depressive Symptoms: depressed mood, anhedonia, insomnia, psychomotor  retardation, fatigue, difficulty concentrating, hopelessness, impaired memory, anxiety,  (Hypo) Manic Symptoms:   Elevated Mood:  No Irritable Mood:  No Grandiosity:  No Distractibility:  Yes Labiality of Mood:  No Delusions:  No Hallucinations:  No Impulsivity:  No Sexually Inappropriate Behavior:  No Financial Extravagance:  No Flight of Ideas:  No  Anxiety Symptoms: Excessive Worry:  Yes Panic Symptoms:  No Agoraphobia:  No Obsessive Compulsive: Yes  Symptoms: None, Specific Phobias:  No Social Anxiety:  Yes  Psychotic Symptoms:  Hallucinations: No None Delusions:  No Paranoia:  No   Ideas of Reference:  No  PTSD Symptoms:none   Traumatic Brain Injury: No   Past Psychiatric History: Diagnosis: depression with psychotic features  Hospitalizations: rehospitalizations and [facility]., Patient has also received ECT in the past was also hospitalized at [facility] in 07.  Outpatient Care: Deanna Rowe for medications and [facility] for therapy  Substance Abuse Care:   Self-Mutilation:   Suicidal Attempts: 2  Violent Behaviors:    Past Medical History:   Past Medical History  Diagnosis Date  . Diabetes mellitus   . Hypertension   . Asthma   . Obesity   . Stroke 02/11/2012    Pt reports Hx of TIA's  . Anxiety   . Depression   . Diabetes mellitus, type II    History of Loss of Consciousness:  No Seizure History:  No Cardiac History:  No Allergies:   Allergies  Allergen Reactions  . Codeine Nausea And Vomiting  . Vicodin (Hydrocodone-Acetaminophen)   . Amoxicillin Rash   Current Medications:  Current Outpatient Prescriptions  Medication Sig Dispense Refill  . ARIPiprazole (ABILIFY PO) Take 2.5 mg by mouth.       Marland Kitchen  aspirin 81 MG tablet Take 81 mg by mouth daily.        . Budesonide-Formoterol Fumarate (SYMBICORT IN) Inhale into the lungs.        . BuPROPion HCl (WELLBUTRIN PO) Take 450 mg by mouth.       . Calcium Carbonate-Vitamin D  (CALCIUM + D PO) Take by mouth.        . Cholecalciferol (VITAMIN D) 1000 UNITS capsule Take 1,000 Units by mouth daily.      . ClonazePAM (KLONOPIN PO) Take 0.5 mg by mouth 2 (two) times daily. For anxiety      . DULoxetine (CYMBALTA) 60 MG capsule Take 60 mg by mouth 2 (two) times daily.       Marland Kitchen gabapentin (NEURONTIN) 300 MG capsule Take 300 mg by mouth 3 (three) times daily.       Marland Kitchen glipiZIDE (GLUCOTROL) 5 MG tablet Take 5 mg by mouth daily.      . metFORMIN (GLUCOPHAGE) 500 MG tablet Take 500 mg by mouth 2 (two) times daily after a meal.       . ROPINIRole HCl (REQUIP PO) Take 2 mg by mouth 2 (two) times daily.       . valACYclovir (VALTREX) 1000 MG tablet Take 1,000 mg by mouth daily.      . valsartan-hydrochlorothiazide (DIOVAN-HCT) 160-12.5 MG per tablet Take 1 tablet by mouth daily.      Marland Kitchen zolpidem (AMBIEN) 5 MG tablet Take 5 mg by mouth at bedtime as needed.        Previous Psychotropic Medications:  Medication Dose   multiple medications patient could not remember all the names                       Substance Abuse History in the last 12 months:not applicable Substance Age of 1st Use Last Use Amount Specific Type  Nicotine      Alcohol      Cannabis      Opiates      Cocaine      Methamphetamines      LSD      Ecstasy      Benzodiazepines      Caffeine      Inhalants      Others:                          Medical Consequences of Substance Abuse: none  Legal Consequences of Substance Abuse: none  Family Consequences of Substance Abuse: none  Blackouts:  No DT's:  No Withdrawal Symptoms:  No None  Social History: Current Place of Residence[place] of Birth:  Family Members:  Marital Status:  Married Children:   Sons:   Daughters:  Relationships:  Education:  Corporate treasurer Problems/Performance:  Religious Beliefs/Practices:  History of Abuse: none Occupational Experiences;patient is currently on disability for depression since  2003. 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 Legal History: none Hobbies/Interests:   Family History:   Family History  Problem Relation Age of Onset  . Alcohol abuse Brother   . Alcohol abuse Brother   . Alcohol abuse Brother   . Alcohol abuse Brother     Mental Status Examination/Evaluation: Objective:  Appearance: Casual  Eye Contact::  Minimal  Speech:  Slow  Volume:  Decreased  Mood:   depressed and very dysphoric  Affect:  Constricted, Depressed and Flat  Thought Process:  Linear  Orientation:  Full  Thought Content:  Rumination  Suicidal Thoughts:  No  Homicidal Thoughts:  No  Judgement:  Intact  Insight:  Present  Psychomotor Activity:  Decreased  Akathisia:  No  Handed:  Right  AIMS (if indicated):    Assets:  Communication Skills Desire for Improvement Physical Rowe Resilience Social Support    Laboratory/X-Ray Psychological Evaluation(s)        Assessment:  Axis I: Anxiety Disorder NOS and Major Depression, Recurrent severe  AXIS I Anxiety Disorder NOS and Major Depression, Recurrent severe  AXIS II Deferred  AXIS III Past Medical History  Diagnosis Date  . Diabetes mellitus   . Hypertension   . Asthma   . Obesity   . Stroke 02/11/2012    Pt reports Hx of TIA's  . Anxiety   . Depression   . Diabetes mellitus, type II      AXIS IV economic problems, occupational problems, problems related to social environment and problems with primary support group  AXIS V 51-60 moderate symptoms   Treatment Plan/Recommendations:  Plan of Care: and.IOP and  Laboratory:  labs were done on the inpatient unit  Psychotherapy: group individual therapy  Medications: patient will be continued on all the above medications at the present doses  Routine PRN Medications:  Yes  Consultations:   Safety Concerns:  none  Other:  Patient has been told to go visit her gynecologist regarding HRT. Patient will also go to [employer] and pick out  [employer] to read at night.  Margit Banda  [facility] 11/21/201312:27 PM

## 2012-02-24 ENCOUNTER — Other Ambulatory Visit (HOSPITAL_COMMUNITY): Payer: 59 | Admitting: Psychiatry

## 2012-02-24 DIAGNOSIS — E669 Obesity, unspecified: Secondary | ICD-10-CM | POA: Diagnosis not present

## 2012-02-24 DIAGNOSIS — I10 Essential (primary) hypertension: Secondary | ICD-10-CM | POA: Diagnosis not present

## 2012-02-24 DIAGNOSIS — E119 Type 2 diabetes mellitus without complications: Secondary | ICD-10-CM | POA: Diagnosis not present

## 2012-02-24 DIAGNOSIS — F329 Major depressive disorder, single episode, unspecified: Secondary | ICD-10-CM

## 2012-02-24 DIAGNOSIS — F411 Generalized anxiety disorder: Secondary | ICD-10-CM | POA: Diagnosis not present

## 2012-02-24 DIAGNOSIS — J45909 Unspecified asthma, uncomplicated: Secondary | ICD-10-CM | POA: Diagnosis not present

## 2012-02-24 DIAGNOSIS — F332 Major depressive disorder, recurrent severe without psychotic features: Secondary | ICD-10-CM | POA: Diagnosis not present

## 2012-02-24 DIAGNOSIS — F32A Depression, unspecified: Secondary | ICD-10-CM

## 2012-02-24 NOTE — Progress Notes (Signed)
    Daily Group Progress Note  Program: IOP  Group Time: 9:00-10:30 am   Participation Level: Active  Behavioral Response: Appropriate  Type of Therapy:  Process Group  Summary of Progress: patient reports high depression today and feelings of hopeless that she will get better but denies thoughts of suicide. She questions her purpose now that she is unable to work following past ECT treatments that affected her memory and concentration. 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 She is questioning that as well as how to grieve the death of her sister.      Group Time: 10:30 am - 12:00 pm   Participation Level:  Active  Behavioral Response: Appropriate  Type of Therapy: Psycho-education Group  Summary of Progress: Patient participated in a goodbye ceremony to a member ending the group today and practiced the skills of expressing emotions and having healthy closure.  Carman Ching, LCSW

## 2012-02-27 ENCOUNTER — Other Ambulatory Visit (HOSPITAL_COMMUNITY): Payer: 59 | Admitting: Psychiatry

## 2012-02-27 DIAGNOSIS — J45909 Unspecified asthma, uncomplicated: Secondary | ICD-10-CM | POA: Diagnosis not present

## 2012-02-27 DIAGNOSIS — I10 Essential (primary) hypertension: Secondary | ICD-10-CM | POA: Diagnosis not present

## 2012-02-27 DIAGNOSIS — F411 Generalized anxiety disorder: Secondary | ICD-10-CM | POA: Diagnosis not present

## 2012-02-27 DIAGNOSIS — E669 Obesity, unspecified: Secondary | ICD-10-CM | POA: Diagnosis not present

## 2012-02-27 DIAGNOSIS — F329 Major depressive disorder, single episode, unspecified: Secondary | ICD-10-CM

## 2012-02-27 DIAGNOSIS — E119 Type 2 diabetes mellitus without complications: Secondary | ICD-10-CM | POA: Diagnosis not present

## 2012-02-27 DIAGNOSIS — F332 Major depressive disorder, recurrent severe without psychotic features: Secondary | ICD-10-CM | POA: Diagnosis not present

## 2012-02-27 DIAGNOSIS — F32A Depression, unspecified: Secondary | ICD-10-CM

## 2012-02-27 NOTE — Progress Notes (Signed)
    Daily Group Progress Note  Program: IOP  Group Time: 9:00-10:30 am   Participation Level: Active  Behavioral Response: Appropriate  Type of Therapy:  Process Group  Summary of Progress: Patient reports feeling "good" for the first time since starting the group and was smiling. She states the reason for feeling better is that her sleep is improving and she is feeling rested and no longer has the intense feelings of worthlessness she has been having. She said she feels like her depression is starting to lift and described it as feeling good.      Group Time: 10:30 am - 12:00 pm   Participation Level:  Active  Behavioral Response: Appropriate  Type of Therapy: Psycho-education Group  Summary of Progress: Patient participated in a group on grief and loss and on identifying healthy ways to grieve current losses impacted overall wellness.   Carman Ching, LCSW

## 2012-02-28 ENCOUNTER — Other Ambulatory Visit (HOSPITAL_COMMUNITY): Payer: 59 | Admitting: Psychiatry

## 2012-02-28 DIAGNOSIS — J45909 Unspecified asthma, uncomplicated: Secondary | ICD-10-CM | POA: Diagnosis not present

## 2012-02-28 DIAGNOSIS — F32A Depression, unspecified: Secondary | ICD-10-CM

## 2012-02-28 DIAGNOSIS — F332 Major depressive disorder, recurrent severe without psychotic features: Secondary | ICD-10-CM | POA: Diagnosis not present

## 2012-02-28 DIAGNOSIS — F329 Major depressive disorder, single episode, unspecified: Secondary | ICD-10-CM

## 2012-02-28 DIAGNOSIS — F411 Generalized anxiety disorder: Secondary | ICD-10-CM | POA: Diagnosis not present

## 2012-02-28 DIAGNOSIS — I10 Essential (primary) hypertension: Secondary | ICD-10-CM | POA: Diagnosis not present

## 2012-02-28 DIAGNOSIS — E119 Type 2 diabetes mellitus without complications: Secondary | ICD-10-CM | POA: Diagnosis not present

## 2012-02-28 DIAGNOSIS — E669 Obesity, unspecified: Secondary | ICD-10-CM | POA: Diagnosis not present

## 2012-02-28 NOTE — Progress Notes (Signed)
    Daily Group Progress Note  Program: IOP  Group Time: 9:00-10:30 am   Participation Level: Active  Behavioral Response: Appropriate  Type of Therapy:  Process Group  Summary of Progress: patient reports improved mood again today and said her depression symptoms appear to be lifting some from when she started the group and she continues to get sleep. Her main stressor now is feeling guilty over not being able to help others. She states her tendency is to put others needs before her own and she is aware logically that she needs to do this but struggles with the feelings of guilt she has when she takes care of her own needs. She also shared how her husband and other family members don't support her depression and told her she was "crazy and a disgrace to the family" for needing to go to the "crazy hospital". She is working on being confident in depression as a medical condition, despite her families opinions.      Group Time: 10:30 am - 12:00 pm   Participation Level:  Active  Behavioral Response: Appropriate  Type of Therapy: Psycho-education Group  Summary of Progress: patient participated in education skills segment on learning the medical symptoms of depression and identifying what current symptoms patient is experiencing to become away of how to recognize symptoms going forward.   Carman Ching, LCSW

## 2012-02-29 ENCOUNTER — Other Ambulatory Visit (HOSPITAL_COMMUNITY): Payer: 59 | Admitting: Psychiatry

## 2012-02-29 DIAGNOSIS — I10 Essential (primary) hypertension: Secondary | ICD-10-CM | POA: Diagnosis not present

## 2012-02-29 DIAGNOSIS — F332 Major depressive disorder, recurrent severe without psychotic features: Secondary | ICD-10-CM | POA: Diagnosis not present

## 2012-02-29 DIAGNOSIS — J45909 Unspecified asthma, uncomplicated: Secondary | ICD-10-CM | POA: Diagnosis not present

## 2012-02-29 DIAGNOSIS — F32A Depression, unspecified: Secondary | ICD-10-CM

## 2012-02-29 DIAGNOSIS — F329 Major depressive disorder, single episode, unspecified: Secondary | ICD-10-CM

## 2012-02-29 DIAGNOSIS — E119 Type 2 diabetes mellitus without complications: Secondary | ICD-10-CM | POA: Diagnosis not present

## 2012-02-29 DIAGNOSIS — E669 Obesity, unspecified: Secondary | ICD-10-CM | POA: Diagnosis not present

## 2012-02-29 DIAGNOSIS — F411 Generalized anxiety disorder: Secondary | ICD-10-CM | POA: Diagnosis not present

## 2012-02-29 NOTE — Progress Notes (Signed)
    Daily Group Progress Note  Program: IOP  Group Time: 9:00-10:30 am   Participation Level: Active  Behavioral Response: Appropriate  Type of Therapy:  Process Group  Summary of Progress: Patient reports "feeling happy" today and smiled as she told the group how listening to their stories is helping her to feel hopeful and strong again. She talked of how she looking forward to spending Thanksgiving with her family and used the group for support as to how to set limits with them regarding what she will say to her family when they ask her about her depression and recent suicide attempt. She is practicing setting healthy limits with others and focusing on her wellness.      Group Time: 10:30 am - 12:00 pm   Participation Level:  Active  Behavioral Response: Appropriate  Type of Therapy: Psycho-education Group  Summary of Progress: Patient shared concerns about the upcoming Thanksgiving holiday and created a holiday wellness plan to have wellness symptoms managed until group resumes next Monday.   Carman Ching, LCSW

## 2012-03-02 ENCOUNTER — Other Ambulatory Visit (HOSPITAL_COMMUNITY): Payer: 59

## 2012-03-05 ENCOUNTER — Other Ambulatory Visit (HOSPITAL_COMMUNITY): Payer: 59 | Attending: Psychiatry | Admitting: Psychiatry

## 2012-03-05 DIAGNOSIS — F411 Generalized anxiety disorder: Secondary | ICD-10-CM | POA: Insufficient documentation

## 2012-03-05 DIAGNOSIS — F32A Depression, unspecified: Secondary | ICD-10-CM

## 2012-03-05 DIAGNOSIS — F332 Major depressive disorder, recurrent severe without psychotic features: Secondary | ICD-10-CM | POA: Insufficient documentation

## 2012-03-05 DIAGNOSIS — Z8673 Personal history of transient ischemic attack (TIA), and cerebral infarction without residual deficits: Secondary | ICD-10-CM | POA: Diagnosis not present

## 2012-03-05 DIAGNOSIS — E119 Type 2 diabetes mellitus without complications: Secondary | ICD-10-CM | POA: Diagnosis not present

## 2012-03-05 DIAGNOSIS — F329 Major depressive disorder, single episode, unspecified: Secondary | ICD-10-CM

## 2012-03-05 DIAGNOSIS — I10 Essential (primary) hypertension: Secondary | ICD-10-CM | POA: Diagnosis not present

## 2012-03-05 DIAGNOSIS — E669 Obesity, unspecified: Secondary | ICD-10-CM | POA: Insufficient documentation

## 2012-03-05 DIAGNOSIS — J45909 Unspecified asthma, uncomplicated: Secondary | ICD-10-CM | POA: Diagnosis not present

## 2012-03-05 NOTE — Progress Notes (Signed)
    Daily Group Progress Note  Program: IOP  Group Time: 9:00-10:30 am   Participation Level: Minimal  Behavioral Response: Appropriate  Type of Therapy:  Process Group  Summary of Progress: Patient reports stable mood and still states her depression is improving, but she is quiet today. She became tearful when another member was sharing the severity of his depression and she said she felt sad for him because it reminded her of what she felt like when she was at her most depressed. She said it was a horrible feeling to feel like she hit bottom and no longer wanted to live. She is reflecting on her life and is starting to explore hope for the future.      Group Time: 10:30 am - 12:00 pm   Participation Level:  Active  Behavioral Response: Appropriate  Type of Therapy: Psycho-education Group  Summary of Progress: Patient participated in a group on grief and loss facilitated by Theda Belfast and identified the purpose of grief and what would have to occur to move through the grief.   Carman Ching, LCSW

## 2012-03-06 ENCOUNTER — Other Ambulatory Visit (HOSPITAL_COMMUNITY): Payer: 59 | Admitting: Psychiatry

## 2012-03-06 DIAGNOSIS — F332 Major depressive disorder, recurrent severe without psychotic features: Secondary | ICD-10-CM | POA: Diagnosis not present

## 2012-03-06 DIAGNOSIS — F32A Depression, unspecified: Secondary | ICD-10-CM

## 2012-03-06 DIAGNOSIS — F329 Major depressive disorder, single episode, unspecified: Secondary | ICD-10-CM

## 2012-03-06 DIAGNOSIS — E119 Type 2 diabetes mellitus without complications: Secondary | ICD-10-CM | POA: Diagnosis not present

## 2012-03-06 DIAGNOSIS — F411 Generalized anxiety disorder: Secondary | ICD-10-CM | POA: Diagnosis not present

## 2012-03-06 DIAGNOSIS — J45909 Unspecified asthma, uncomplicated: Secondary | ICD-10-CM | POA: Diagnosis not present

## 2012-03-06 DIAGNOSIS — I10 Essential (primary) hypertension: Secondary | ICD-10-CM | POA: Diagnosis not present

## 2012-03-06 DIAGNOSIS — E669 Obesity, unspecified: Secondary | ICD-10-CM | POA: Diagnosis not present

## 2012-03-06 NOTE — Progress Notes (Signed)
    Daily Group Progress Note  Program: IOP  Group Time: 9:00-10:30 am   Participation Level: Active  Behavioral Response: Appropriate  Type of Therapy:  Process Group  Summary of Progress: Patient reports making progress with her depression and learning to focus on meeting her needs more than she was before entering the group. 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 She is open to new possibilities and has limited negative thinking.      Group Time: 10:30 am - 12:00 pm   Participation Level:  Active  Behavioral Response: Appropriate  Type of Therapy: Psycho-education Group  Summary of Progress: Patient participated in a discussion and education segment on self-esteem and the causes of low self-esteem. Patient identified having low self-esteem currently and gave examples from the past that impacted how patient currently views their self-worth and how this is impacting their behavior today.  Carman Ching, LCSW

## 2012-03-07 ENCOUNTER — Other Ambulatory Visit (HOSPITAL_COMMUNITY): Payer: 59 | Admitting: Psychiatry

## 2012-03-07 DIAGNOSIS — F332 Major depressive disorder, recurrent severe without psychotic features: Secondary | ICD-10-CM | POA: Diagnosis not present

## 2012-03-07 DIAGNOSIS — F411 Generalized anxiety disorder: Secondary | ICD-10-CM | POA: Diagnosis not present

## 2012-03-07 DIAGNOSIS — I10 Essential (primary) hypertension: Secondary | ICD-10-CM | POA: Diagnosis not present

## 2012-03-07 DIAGNOSIS — J45909 Unspecified asthma, uncomplicated: Secondary | ICD-10-CM | POA: Diagnosis not present

## 2012-03-07 DIAGNOSIS — F329 Major depressive disorder, single episode, unspecified: Secondary | ICD-10-CM

## 2012-03-07 DIAGNOSIS — E119 Type 2 diabetes mellitus without complications: Secondary | ICD-10-CM | POA: Diagnosis not present

## 2012-03-07 DIAGNOSIS — E669 Obesity, unspecified: Secondary | ICD-10-CM | POA: Diagnosis not present

## 2012-03-07 DIAGNOSIS — F32A Depression, unspecified: Secondary | ICD-10-CM

## 2012-03-08 ENCOUNTER — Other Ambulatory Visit (HOSPITAL_COMMUNITY): Payer: 59 | Admitting: Psychiatry

## 2012-03-08 DIAGNOSIS — F411 Generalized anxiety disorder: Secondary | ICD-10-CM | POA: Diagnosis not present

## 2012-03-08 DIAGNOSIS — E119 Type 2 diabetes mellitus without complications: Secondary | ICD-10-CM | POA: Diagnosis not present

## 2012-03-08 DIAGNOSIS — J45909 Unspecified asthma, uncomplicated: Secondary | ICD-10-CM | POA: Diagnosis not present

## 2012-03-08 DIAGNOSIS — F332 Major depressive disorder, recurrent severe without psychotic features: Secondary | ICD-10-CM | POA: Diagnosis not present

## 2012-03-08 DIAGNOSIS — I10 Essential (primary) hypertension: Secondary | ICD-10-CM | POA: Diagnosis not present

## 2012-03-08 DIAGNOSIS — F329 Major depressive disorder, single episode, unspecified: Secondary | ICD-10-CM

## 2012-03-08 DIAGNOSIS — E669 Obesity, unspecified: Secondary | ICD-10-CM | POA: Diagnosis not present

## 2012-03-08 DIAGNOSIS — F32A Depression, unspecified: Secondary | ICD-10-CM

## 2012-03-08 NOTE — Progress Notes (Signed)
    Daily Group Progress Note  Program: IOP  Group Time: 9:00-10:30 am   Participation Level: Active  Behavioral Response: Appropriate  Type of Therapy:  Process Group  Summary of Progress: Patient reports feeling "sad" the past two days and feels it is due to a change in lowering one of her medications. She said she if feeling happier most of the time when she is with her family and engaged in activities and feels this is a chemical reaction that can be corrected if she resumes her old medication regimen. She was fearful to share this need with the psychiatrist because "I don't want to be a bother". Members challenged patient on how not asserting herself was not allowing her to make progress with her depression. She related this to her recent suicide attempt and acknowledged she felt the same way about not wanting to ask for help then when she needed it and how she regrets that. She is working on asserting her needs to others to better manage her depression.      Group Time: 10:30 am - 12:00 pm   Participation Level:  Active  Behavioral Response: Appropriate  Type of Therapy: Psycho-education Group  Summary of Progress: Patient participated in learning the DBT skill of Mindfulness. Patient learned the steps to accessing WISE mind through observe, describe and participate and was given homework to practice the skill.  Carman Ching, LCSW

## 2012-03-08 NOTE — Progress Notes (Signed)
    Daily Group Progress Note  Program: IOP  Group Time: 9:00-10:30 am   Participation Level: Active  Behavioral Response: Appropriate  Type of Therapy:  Process Group  Summary of Progress: Patient reports making progress with her depression and feels she is grieving the loss of her sister better than she was when she started the group. She still has some depression but states it has improved significantly since starting the group.      Group Time: 10:30 am - 12:00 pm   Participation Level:  Active  Behavioral Response: Appropriate  Type of Therapy: Psycho-education Group  Summary of Progress: patient followed up in the self-esteem homework assignment and shared how they practiced challenging negative self talk.   Carman Ching, LCSW

## 2012-03-09 ENCOUNTER — Other Ambulatory Visit (HOSPITAL_COMMUNITY): Payer: 59 | Admitting: Psychiatry

## 2012-03-09 DIAGNOSIS — F329 Major depressive disorder, single episode, unspecified: Secondary | ICD-10-CM

## 2012-03-09 DIAGNOSIS — F32A Depression, unspecified: Secondary | ICD-10-CM

## 2012-03-09 DIAGNOSIS — F411 Generalized anxiety disorder: Secondary | ICD-10-CM | POA: Diagnosis not present

## 2012-03-09 DIAGNOSIS — I10 Essential (primary) hypertension: Secondary | ICD-10-CM | POA: Diagnosis not present

## 2012-03-09 DIAGNOSIS — E669 Obesity, unspecified: Secondary | ICD-10-CM | POA: Diagnosis not present

## 2012-03-09 DIAGNOSIS — F332 Major depressive disorder, recurrent severe without psychotic features: Secondary | ICD-10-CM | POA: Diagnosis not present

## 2012-03-09 DIAGNOSIS — J45909 Unspecified asthma, uncomplicated: Secondary | ICD-10-CM | POA: Diagnosis not present

## 2012-03-09 DIAGNOSIS — E119 Type 2 diabetes mellitus without complications: Secondary | ICD-10-CM | POA: Diagnosis not present

## 2012-03-09 MED ORDER — BENZTROPINE MESYLATE 1 MG PO TABS: 0.5000 mg | ORAL_TABLET | Freq: Two times a day (BID) | ORAL | Status: DC

## 2012-03-09 MED ORDER — ARIPIPRAZOLE 5 MG PO TABS: 5.0000 mg | ORAL_TABLET | Freq: Every day | ORAL | Status: DC

## 2012-03-09 NOTE — Progress Notes (Signed)
Patient ID: Deanna Rowe, female   DOB: 1955/07/27, 56 y.o.   MRN: [id number] D: This is a 56 year old married african [place] female, who was referred per therapist [name], [facility]), treatment for depressive and anxiety symptoms with SI. Discussed safety options with pt. Pt able to contract for safety. Denies any A/V hallucinations. According to pt, she was released from [facility] recently. Was admitted there due to worsening depressive symptoms, SI, with psychosis. States she's been feeling depressed for eight weeks. Stressors/Triggers: 1) Medication decrease: States she was on Abilify 5mg  and started having side effects. Reports that [name] decreased it to 2.5mg , and then pt started feeling more depressed. 2) Unresolved grief/loss issues. In 2000-09-06 patient's sister died due to MS. Patient completed MH-IOP today.  Reports feeling much better.  Reports decreased isolation, sadness, increased self esteem, able to make decisions, irritability, sleeping better and appetite is "great."  Denies SI/HI.  Denies A/V hallucinations.  States groups were helpful.  Plans to continue working on applying coping skills she has learned.  A:  D/C from MH-IOP today.  F/U with [name] and Hal Neer, [facility] (covering while [name], [facility] is on leave).  Encouraged support groups.  Pt is considering to volunteer.  R:  Pt receptive.

## 2012-03-09 NOTE — Progress Notes (Signed)
    Daily Group Progress Note  Program: IOP  Group Time: 9:00-10:30 am     Participation Level: Active  Behavioral Response: Appropriate  Type of Therapy:  Process Group  Summary of Progress: Today is patients last day in the group. 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 She lacked hope for the future and questioned if she would feel better again. She states she has made improvement with her depression since starting the program and has learned that depression is a medical condition that she needs to treat and manage. 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 She also learned ways to work on grieving the loss of her sister and be more assertive and ask for help when she is struggling.      Group Time: 10:30 am - 12:00 pm   Participation Level:  Active  Behavioral Response: Appropriate  Type of Therapy: Psycho-education Group  Summary of Progress: Patient practiced the skill of Mindfulness that was discussed yesterday and reported back on what patient did for homework to practice the skill. Patient discussed ways to continue using mindfulness throughout the day to manage stress and make more affective decisions. Patient practiced a ten minute mindfulness meditation Mortimer Fries) and was given access to the web site to use over the weekend.  Carman Ching, LCSW

## 2012-03-09 NOTE — Patient Instructions (Signed)
Patient completed MH-IOP today.  Will follow up with [name] on 04-09-12 @ 11:30 a.m and Hal Neer, [facility] on 03-19-12 @ 11 a.m.. Encouraged support groups.  Patient is considering to volunteer.

## 2012-03-12 ENCOUNTER — Other Ambulatory Visit (HOSPITAL_COMMUNITY): Payer: 59

## 2012-03-14 NOTE — Addendum Note (Signed)
Addended by: Margit Banda D on: 03/14/2012 01:56 PM   Modules accepted: Orders

## 2012-03-14 NOTE — Progress Notes (Signed)
Discharge Note  Patient:  Deanna Rowe is an 56 y.o., female DOB:  February 10, 1956  Date of Admission: 02-21-12  Date of Discharge:   03-09-12   Reason for Admission: Patient was transferred from inpatient unit at old [place] for further stabilization. Patient had been admitted there because of depression with psychosis  Hospital Course: patient started IOP and was continued on her medications. She continued to have significant amount of anxiety. Patient was menopausal but was not on hormone replacement and so was referred to her gynecologist, who started her on Prempro. Patient began talking in groups and did significant amount of grief therapy regarding the guilt that she carried about her sister's death who died of MS 23 years ago. Patient gradually stabilized her mood improved her sleep and appetite improved her anxiety decreased she no longer felt hopeless or helpless and had no thoughts of suicide. Patient was experiencing some crying spells and so her Abilify was increased to 5 mg every day and Cogentin 0.5 mg was added to that. She then called [name] stating that she had been allergic to Cogentin so this was discontinued and she was asked to take Benadryl 25 mg at bedtime and she stated understanding.   patient was coping very well and doing well and began considering volunteering at an animal shelter.  Mental Status at Discharge: alert, oriented x3, affect was bright mood was euthymic speech was normal. No suicidal or homicidal ideation. No hallucinations or delusions. Recent and remote memory was good, judgment and insight were good, concentration and recall was good. Patient had no EPS symptoms   Lab Results: No results found for this or any previous visit (from the past 48 hour(s)). the labs were performed   Current outpatient prescriptions:ARIPiprazole (ABILIFY) 5 MG tablet, Take 1 tablet (5 mg total) by mouth daily., Disp: 30 tablet, Rfl: 0;  ARIPiprazole (ABILIFY) 5 MG tablet, Take 1  tablet (5 mg total) by mouth daily., Disp: 30 tablet, Rfl: 0;  aspirin 81 MG tablet, Take 81 mg by mouth daily.  , Disp: , Rfl: ;  benztropine (COGENTIN) 1 MG tablet, Take 0.5 tablets (0.5 mg total) by mouth 2 (two) times daily., Disp: 60 tablet, Rfl: 0 Budesonide-Formoterol Fumarate (SYMBICORT IN), Inhale into the lungs.  , Disp: , Rfl: ;  BuPROPion HCl (WELLBUTRIN PO), Take 450 mg by mouth. , Disp: , Rfl: ;  Calcium Carbonate-Vitamin D (CALCIUM + D PO), Take by mouth.  , Disp: , Rfl: ;  Cholecalciferol (VITAMIN D) 1000 UNITS capsule, Take 1,000 Units by mouth daily., Disp: , Rfl: ;  ClonazePAM (KLONOPIN PO), Take 0.5 mg by mouth 2 (two) times daily. For anxiety, Disp: , Rfl:  DULoxetine (CYMBALTA) 60 MG capsule, Take 60 mg by mouth 2 (two) times daily. , Disp: , Rfl: ;  gabapentin (NEURONTIN) 300 MG capsule, Take 300 mg by mouth 3 (three) times daily. , Disp: , Rfl: ;  glipiZIDE (GLUCOTROL) 5 MG tablet, Take 5 mg by mouth daily., Disp: , Rfl: ;  metFORMIN (GLUCOPHAGE) 500 MG tablet, Take 500 mg by mouth 2 (two) times daily after a meal. , Disp: , Rfl:  ROPINIRole HCl (REQUIP PO), Take 2 mg by mouth 2 (two) times daily. 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 Diabetes mellitus   . Hypertension   . Asthma   . Obesity   . Stroke 02/11/2012    Pt reports Hx of TIA's  . Anxiety   . Depression   . Diabetes mellitus, type II    Axis IV: other psychosocial or environmental problems, problems related to social environment and problems with primary support group Axis  V: 61-70 mild symptoms   Level of Care:  OP  Discharge destination:  Home  Is patient on multiple antipsychotic therapies at discharge:  No    Has Patient had three or more failed trials of antipsychotic monotherapy by history:  No  Patient phone:  [phone] (home)  Patient address:   [address] [place] [address],   Follow-up recommendations:  Activity:  As tolerated Diet:  Regular Other:  Followup with [name] for medications and Lupita Leash hood for therapy  Comments:    The patient received suicide prevention pamphlet:  No Belongings returned:    Margit Banda 03/14/2012, 1:36 PM

## 2012-03-29 DIAGNOSIS — F330 Major depressive disorder, recurrent, mild: Secondary | ICD-10-CM | POA: Diagnosis not present

## 2012-04-09 DIAGNOSIS — F332 Major depressive disorder, recurrent severe without psychotic features: Secondary | ICD-10-CM | POA: Diagnosis not present

## 2012-05-21 DIAGNOSIS — F330 Major depressive disorder, recurrent, mild: Secondary | ICD-10-CM | POA: Diagnosis not present

## 2012-07-19 ENCOUNTER — Ambulatory Visit (INDEPENDENT_AMBULATORY_CARE_PROVIDER_SITE_OTHER): Payer: Self-pay | Admitting: Family Medicine

## 2012-07-19 DIAGNOSIS — E119 Type 2 diabetes mellitus without complications: Secondary | ICD-10-CM

## 2012-07-19 NOTE — Progress Notes (Signed)
Patient presents today for DM follow-up as part of employer-sponsored Link to [employer]. Medications, glucose readings, a1c and compliance have been reviewed. I have also discussed with patient lifestyle interventions including diet and exercise. Details of the visit can be found in [employer] documenting program through [facility]). Patient has set a series of personal goals and will follow-up in no more than 3 months for further review of DM.  Specific issues addressed today include: Significant improvement of hypoglycemic event rate. Previously 3-4 events per week, now an event every 2 weeks (over last 60 days). Improved a1c (from 7.3% to 7.0%). Discussed improving quality of exercise (increase HR). Also discussed importance of managing depression within DM.

## 2012-07-26 NOTE — Progress Notes (Signed)
Patient ID: Deanna Rowe, female   DOB: December 07, 1955, 57 y.o.   MRN: [id number] ATTENDING PHYSICIAN NOTE: I have reviewed the chart and agree with the plan as detailed above. Denny Levy MD Pager [phone]

## 2012-07-27 ENCOUNTER — Other Ambulatory Visit: Payer: Self-pay | Admitting: Internal Medicine

## 2012-07-27 DIAGNOSIS — R1312 Dysphagia, oropharyngeal phase: Secondary | ICD-10-CM

## 2012-08-06 ENCOUNTER — Ambulatory Visit
Admission: RE | Admit: 2012-08-06 | Discharge: 2012-08-06 | Disposition: A | Discharge status: Home / Self Care | Payer: 59 | Source: Ambulatory Visit | Attending: Internal Medicine | Admitting: Internal Medicine

## 2012-08-06 DIAGNOSIS — K228 Other specified diseases of esophagus: Secondary | ICD-10-CM | POA: Diagnosis not present

## 2012-08-06 DIAGNOSIS — K2289 Other specified disease of esophagus: Secondary | ICD-10-CM | POA: Diagnosis not present

## 2012-08-06 DIAGNOSIS — R1312 Dysphagia, oropharyngeal phase: Secondary | ICD-10-CM

## 2012-08-15 DIAGNOSIS — R1310 Dysphagia, unspecified: Secondary | ICD-10-CM | POA: Diagnosis not present

## 2012-08-22 DIAGNOSIS — R1310 Dysphagia, unspecified: Secondary | ICD-10-CM | POA: Diagnosis not present

## 2012-09-02 NOTE — H&P (Signed)
  Problem: Esophageal dysphagia.  History: The patient is a 57 year old female born 12-17-1955.  The patient has esophageal dysphagia. Barium esophagram showed significant esophageal spasm with poor primary peristaltic waves; the barium tablet traversed the esophagus without obstruction. The patient underwent a normal esophagogastroduodenoscopy.  Despite taking proton pump inhibitor therapy twice daily, the patient continues to experience esophageal dysphagia.  The patient is scheduled to undergo diagnostic esophageal manometry to rule out achalasia.  Chronic medications: Protonix. Wellbutrin. Abilify. Calcium. Vitamin D. Claritin. Nasacort. Aspirin. Glipizide. Ambien. Cymbalta. Klonopin. Gabapentin. Metformin. Ropinirole. Diovan. Oxybutynin. Premarin. Medroxyprogesterone. Amlodipine.  Past medical and surgical history: Hypertension. Bipolar disorder. Lumbar and radiculopathy. Restless leg syndrome. Degenerative joint disease. Allergic rhinitis. Migraine headache syndrome. Erythema multiforme. Sigmoid colon diverticulosis. Normal screening colonoscopy in 2007. Insomnia. Hormone replacement therapy. Sensoroneural hearing loss. Paresthesias of the extremities. Cough variant asthma.  Plan: Proceed with diagnostic esophageal manometry to rule out achalasia.

## 2012-09-03 ENCOUNTER — Ambulatory Visit (HOSPITAL_COMMUNITY)
Admission: RE | Admit: 2012-09-03 | Discharge: 2012-09-03 | Disposition: A | Discharge status: Home / Self Care | Payer: 59 | Source: Ambulatory Visit | Attending: Gastroenterology | Admitting: Gastroenterology

## 2012-09-03 ENCOUNTER — Encounter (HOSPITAL_COMMUNITY)
Admission: RE | Disposition: A | Discharge status: Home / Self Care | Payer: Self-pay | Source: Ambulatory Visit | Attending: Gastroenterology

## 2012-09-03 ENCOUNTER — Encounter (HOSPITAL_COMMUNITY): Payer: Self-pay | Admitting: *Deleted

## 2012-09-03 DIAGNOSIS — Z885 Allergy status to narcotic agent status: Secondary | ICD-10-CM | POA: Insufficient documentation

## 2012-09-03 DIAGNOSIS — K5730 Diverticulosis of large intestine without perforation or abscess without bleeding: Secondary | ICD-10-CM | POA: Insufficient documentation

## 2012-09-03 DIAGNOSIS — H905 Unspecified sensorineural hearing loss: Secondary | ICD-10-CM | POA: Diagnosis not present

## 2012-09-03 DIAGNOSIS — R1314 Dysphagia, pharyngoesophageal phase: Secondary | ICD-10-CM | POA: Diagnosis not present

## 2012-09-03 DIAGNOSIS — K3189 Other diseases of stomach and duodenum: Secondary | ICD-10-CM | POA: Insufficient documentation

## 2012-09-03 DIAGNOSIS — Z880 Allergy status to penicillin: Secondary | ICD-10-CM | POA: Insufficient documentation

## 2012-09-03 DIAGNOSIS — IMO0002 Reserved for concepts with insufficient information to code with codable children: Secondary | ICD-10-CM | POA: Insufficient documentation

## 2012-09-03 DIAGNOSIS — Z79899 Other long term (current) drug therapy: Secondary | ICD-10-CM | POA: Insufficient documentation

## 2012-09-03 DIAGNOSIS — I10 Essential (primary) hypertension: Secondary | ICD-10-CM | POA: Insufficient documentation

## 2012-09-03 DIAGNOSIS — G43909 Migraine, unspecified, not intractable, without status migrainosus: Secondary | ICD-10-CM | POA: Insufficient documentation

## 2012-09-03 DIAGNOSIS — M1990 Unspecified osteoarthritis, unspecified site: Secondary | ICD-10-CM | POA: Diagnosis not present

## 2012-09-03 DIAGNOSIS — J45991 Cough variant asthma: Secondary | ICD-10-CM | POA: Insufficient documentation

## 2012-09-03 DIAGNOSIS — K219 Gastro-esophageal reflux disease without esophagitis: Secondary | ICD-10-CM | POA: Diagnosis not present

## 2012-09-03 DIAGNOSIS — Z7982 Long term (current) use of aspirin: Secondary | ICD-10-CM | POA: Diagnosis not present

## 2012-09-03 DIAGNOSIS — R1310 Dysphagia, unspecified: Secondary | ICD-10-CM

## 2012-09-03 DIAGNOSIS — L519 Erythema multiforme, unspecified: Secondary | ICD-10-CM | POA: Diagnosis not present

## 2012-09-03 DIAGNOSIS — K224 Dyskinesia of esophagus: Secondary | ICD-10-CM | POA: Diagnosis not present

## 2012-09-03 DIAGNOSIS — J309 Allergic rhinitis, unspecified: Secondary | ICD-10-CM | POA: Insufficient documentation

## 2012-09-03 DIAGNOSIS — Z888 Allergy status to other drugs, medicaments and biological substances status: Secondary | ICD-10-CM | POA: Diagnosis not present

## 2012-09-03 DIAGNOSIS — E119 Type 2 diabetes mellitus without complications: Secondary | ICD-10-CM | POA: Insufficient documentation

## 2012-09-03 DIAGNOSIS — F3130 Bipolar disorder, current episode depressed, mild or moderate severity, unspecified: Secondary | ICD-10-CM | POA: Diagnosis not present

## 2012-09-03 DIAGNOSIS — R1319 Other dysphagia: Secondary | ICD-10-CM

## 2012-09-03 HISTORY — PX: ESOPHAGEAL MANOMETRY: SHX5429

## 2012-09-03 SURGERY — MANOMETRY, ESOPHAGUS

## 2012-09-03 MED ORDER — LIDOCAINE VISCOUS 2 % MT SOLN
OROMUCOSAL | Status: AC
  Filled 2012-09-03: qty 15

## 2012-09-03 NOTE — OR Nursing (Signed)
Spoke to [place] at Dr. Henriette Combs office regarding esophageal manometry scheduled for 09/03/2012.  Stated that as long as pt was still having difficulty swallowing, to proceed with test at [facility] endo.  Pt stated she continued to have difficulties swallowing.  Passed on to pt instructions to call Dr. Henriette Combs office today and make an appt to review results. Ennis Forts, RN

## 2012-09-05 ENCOUNTER — Encounter (HOSPITAL_COMMUNITY): Payer: Self-pay | Admitting: Gastroenterology

## 2012-09-12 DIAGNOSIS — K219 Gastro-esophageal reflux disease without esophagitis: Secondary | ICD-10-CM | POA: Diagnosis present

## 2012-09-20 ENCOUNTER — Ambulatory Visit (INDEPENDENT_AMBULATORY_CARE_PROVIDER_SITE_OTHER): Payer: Self-pay | Admitting: Family Medicine

## 2012-09-20 DIAGNOSIS — E119 Type 2 diabetes mellitus without complications: Secondary | ICD-10-CM

## 2012-09-20 NOTE — Progress Notes (Signed)
Patient presents today for DM follow-up as part of employer-sponsored Link to Wellness Program. Medications, glucose readings, a1c and compliance have been reviewed. I have also discussed with patient lifestyle interventions including diet and exercise. Details of the visit can be found in [employer] documenting program through [facility] Patient has set a series of personal goals and will follow-up in no more than 3 months for further review of DM. 

## 2012-09-24 DIAGNOSIS — F330 Major depressive disorder, recurrent, mild: Secondary | ICD-10-CM | POA: Diagnosis not present

## 2012-09-24 DIAGNOSIS — F332 Major depressive disorder, recurrent severe without psychotic features: Secondary | ICD-10-CM | POA: Diagnosis not present

## 2012-10-03 DIAGNOSIS — M7710 Lateral epicondylitis, unspecified elbow: Secondary | ICD-10-CM | POA: Diagnosis not present

## 2012-10-17 DIAGNOSIS — M7710 Lateral epicondylitis, unspecified elbow: Secondary | ICD-10-CM | POA: Diagnosis not present

## 2012-10-23 NOTE — Progress Notes (Signed)
Patient ID: Deanna Rowe, female   DOB: 08/01/1955, 57 y.o.   MRN: [id number] ATTENDING PHYSICIAN NOTE: I have reviewed the chart and agree with the plan as detailed above. Denny Levy MD Pager [phone]

## 2012-11-29 ENCOUNTER — Ambulatory Visit (INDEPENDENT_AMBULATORY_CARE_PROVIDER_SITE_OTHER): Payer: 59 | Admitting: Family Medicine

## 2012-11-29 DIAGNOSIS — E119 Type 2 diabetes mellitus without complications: Secondary | ICD-10-CM

## 2012-11-29 NOTE — Progress Notes (Signed)
Patient presents today for DM follow-up as part of employer-sponsored Link to Wellness Program. Medications, glucose readings, a1c and compliance have been reviewed. I have also discussed with patient lifestyle interventions including diet and exercise. Details of the visit can be found in [employer] documenting program through [facility] Patient has set a series of personal goals and will follow-up in no more than 3 months for further review of DM. 

## 2012-12-12 DIAGNOSIS — M7710 Lateral epicondylitis, unspecified elbow: Secondary | ICD-10-CM | POA: Diagnosis not present

## 2013-01-23 DIAGNOSIS — Z23 Encounter for immunization: Secondary | ICD-10-CM | POA: Diagnosis not present

## 2013-01-28 ENCOUNTER — Telehealth: Payer: Self-pay | Admitting: Neurology

## 2013-01-28 NOTE — Telephone Encounter (Signed)
Called patient and informed  Per VM message since she had not been seen her for dizziness to contact her pcp first and if needed have a referral sent here for this new problem

## 2013-01-30 NOTE — Progress Notes (Signed)
Patient ID: Deanna Rowe, female   DOB: 04/23/1955, 57 y.o.   MRN: [id number] ATTENDING PHYSICIAN NOTE: I have reviewed the chart and agree with the plan as detailed above. Denny Levy MD Pager [phone]

## 2013-01-31 NOTE — Progress Notes (Signed)
Patient presents today for DM follow-up as part of employer-sponsored Link to Wellness Program. Medications, glucose readings, a1c and compliance have been reviewed. I have also discussed with patient lifestyle interventions including diet and exercise. Details of the visit can be found in [employer] documenting program through [facility] Patient has set a series of personal goals and will follow-up in no more than 3 months for further review of DM. 

## 2013-02-01 ENCOUNTER — Ambulatory Visit (INDEPENDENT_AMBULATORY_CARE_PROVIDER_SITE_OTHER): Payer: 59 | Admitting: Family Medicine

## 2013-02-01 DIAGNOSIS — E119 Type 2 diabetes mellitus without complications: Secondary | ICD-10-CM

## 2013-02-20 ENCOUNTER — Encounter: Payer: Self-pay | Admitting: Interventional Cardiology

## 2013-02-20 DIAGNOSIS — F419 Anxiety disorder, unspecified: Secondary | ICD-10-CM | POA: Insufficient documentation

## 2013-02-22 ENCOUNTER — Ambulatory Visit (INDEPENDENT_AMBULATORY_CARE_PROVIDER_SITE_OTHER): Payer: 59 | Admitting: Interventional Cardiology

## 2013-02-22 ENCOUNTER — Encounter: Payer: Self-pay | Admitting: Interventional Cardiology

## 2013-02-22 VITALS — BP 150/96 | HR 88 | Ht 66.0 in | Wt 211.0 lb

## 2013-02-22 DIAGNOSIS — R0602 Shortness of breath: Secondary | ICD-10-CM

## 2013-02-22 DIAGNOSIS — R002 Palpitations: Secondary | ICD-10-CM | POA: Diagnosis not present

## 2013-02-22 DIAGNOSIS — R42 Dizziness and giddiness: Secondary | ICD-10-CM

## 2013-02-22 LAB — CBC
HCT: 37.2 % (ref 36.0–46.0)
Hemoglobin: 12.5 g/dL (ref 12.0–15.0)
MCHC: 33.6 g/dL (ref 30.0–36.0)
MCV: 101.2 fl — ABNORMAL HIGH (ref 78.0–100.0)
Platelets: 283 10*3/uL (ref 150.0–400.0)
RBC: 3.67 Mil/uL — ABNORMAL LOW (ref 3.87–5.11)
RDW: 15.1 % — ABNORMAL HIGH (ref 11.5–14.6)
WBC: 6.5 10*3/uL (ref 4.5–10.5)

## 2013-02-22 LAB — BRAIN NATRIURETIC PEPTIDE: Pro B Natriuretic peptide (BNP): 12 pg/mL (ref 0.0–100.0)

## 2013-02-22 NOTE — Progress Notes (Signed)
Patient ID: Deanna Rowe, female   DOB: 1955-11-22, 57 y.o.   MRN: [id number]     Patient ID: Deanna Rowe MRN: [id number] DOB/AGE: 08-Mar-1956 57 y.o.   Referring Physician Dr. Kevan Ny   Reason for Consultation  Dizziness, [facility]  HPI: 57 y/o who has had intermittent dizziness.  It can occur with walking or when she stands up quickly.  No syncope.  She has fallen after getting up from bed.  She did not hurt herself.  She has had some palpitations.  Dizziness is there daily.  She has had an irregular heartbeat. She was concerned about AFib.   Dizziness is worse when she tries to move quickly.  With the palpitatins, she can get sweaty.   Her most physical activity is housework and walking up stairs.  She has had to stop these activities after a few minutes.  SHe has to stop walking after one flight of stairs.     Current Outpatient Prescriptions  Medication Sig Dispense Refill  . ALPRAZolam (XANAX) 1 MG tablet Take 1 mg by mouth 2 (two) times daily as needed for anxiety.      Marland Kitchen amLODipine (NORVASC) 5 MG tablet Take 5 mg by mouth daily.      . ARIPiprazole (ABILIFY) 5 MG tablet Take 2.5 mg by mouth daily.      Marland Kitchen aspirin EC 81 MG tablet Take 81 mg by mouth daily.      Marland Kitchen buPROPion (WELLBUTRIN XL) 150 MG 24 hr tablet Take 450 mg by mouth daily.      . Calcium Carbonate-Vitamin D (CALCIUM + D PO) Take by mouth.        . Cholecalciferol (VITAMIN D) 1000 UNITS capsule Take 1,000 Units by mouth daily.      . ClonazePAM (KLONOPIN PO) Take 0.5 mg by mouth 2 (two) times daily. For anxiety      . DULoxetine (CYMBALTA) 60 MG capsule Take 60 mg by mouth 2 (two) times daily.       Marland Kitchen gabapentin (NEURONTIN) 300 MG capsule Take 300 mg by mouth daily.       . meclizine (ANTIVERT) 12.5 MG tablet Take 12.5 mg by mouth 3 (three) times daily as needed for dizziness.      . metFORMIN (GLUCOPHAGE) 500 MG tablet Take 500 mg by mouth 2 (two) times daily after a meal.       . pantoprazole (PROTONIX) 40 MG  tablet Take 40 mg by mouth daily.      Marland Kitchen ROPINIRole HCl (REQUIP PO) Take 2 mg by mouth 2 (two) times daily.       . valACYclovir (VALTREX) 1000 MG tablet Take 1,000 mg by mouth daily.      . valsartan-hydrochlorothiazide (DIOVAN-HCT) 160-12.5 MG per tablet Take 1 tablet by mouth daily.      Marland Kitchen zolpidem (AMBIEN) 5 MG tablet Take 5 mg by mouth at bedtime as needed.       No current facility-administered medications for this visit.   Past Medical History  Diagnosis Date  . Diabetes mellitus   . Hypertension   . Asthma   . Obesity   . Stroke 02/11/2012    Pt reports Hx of TIA's  . Anxiety   . Depression   . Diabetes mellitus, type II     Family History  Problem Relation Age of Onset  . Alcohol abuse Brother   . Alcohol abuse Brother   . Alcohol abuse Brother   . Alcohol abuse Brother  History   Social History  . Marital Status: Married    Spouse Name: N/A    Number of Children: N/A  . Years of Education: N/A   Occupational History  . Not on file.   Social History Main Topics  . Smoking status: Never Smoker   . Smokeless tobacco: Not on file  . Alcohol Use: No  . Drug Use: No  . Sexual Activity: Not Currently   Other Topics Concern  . Not on file   Social History Narrative  . No narrative on file    Past Surgical History  Procedure Laterality Date  . Hip surgery    . Dilation and curettage of uterus    . Pilonidal cyst drainage    . Esophageal manometry N/A 09/03/2012    Procedure: ESOPHAGEAL MANOMETRY (EM);  Surgeon: Charolett Bumpers, MD;  Location: [facility];  Service: Endoscopy;  Laterality: N/A;      (Not in a hospital admission)  Review of systems complete and found to be negative unless listed above .  No nausea, vomiting.  No fever chills, No focal weakness,  No palpitations.  Physical Exam: Filed Vitals:   02/22/13 1350  BP: 150/96  Pulse: 88    Weight: 211 lb (95.709 kg)  Physical exam:  [place]/AT EOMI No JVD, No carotid bruit RRR S1S2  No  wheezing Soft. NT, nondistended No edema. No focal motor or sensory deficits Normal affect  Labs:   Lab Results  Component Value Date   WBC 10.7* 01/26/2010   HGB 8.3* 01/26/2010   HCT 24.1* 01/26/2010   MCV 101.7* 01/26/2010   PLT 183 01/26/2010   No results found for this basename: NA, K, CL, CO2, BUN, CREATININE, CALCIUM, LABALBU, PROT, BILITOT, ALKPHOS, ALT, AST, GLUCOSE,  in the last 168 hours No results found for this basename: CKTOTAL, CKMB, CKMBINDEX, TROPONINI    No results found for this basename: CHOL   No results found for this basename: HDL   No results found for this basename: LDLCALC   No results found for this basename: TRIG   No results found for this basename: CHOLHDL   No results found for this basename: LDLDIRECT      Of note, hemoglobin was 12.4 in January 2014 EKG: Sinus tachycardia, no ST segment changes in November 2014  ASSESSMENT AND PLAN:  1)  Dizziness: I have asked her to stay well hydrated by drinking plenty of water.  Unclear etiology. 2) palpitations: We'll plan for event monitor. 3) shortness of breath: Check BNP. We'll also check echocardiogram to evaluate for structural heart disease. Unclear why she would've sinus tachycardia on resting EKG. Followup in 6 weeks. 4) prior anemia in 2011. Hemoglobin was normal earlier this year. Will recheck as well. Signed:   Fredric Mare, MD, [facility] 02/22/2013, 2:13 PM

## 2013-02-22 NOTE — Patient Instructions (Signed)
Your physician recommends that you schedule a follow-up appointment in: 6 weeks with Dr. Eldridge Dace.  Your physician has requested that you have an echocardiogram. Echocardiography is a painless test that uses sound waves to create images of your heart. It provides your doctor with information about the size and shape of your heart and how well your heart's chambers and valves are working. This procedure takes approximately one hour. There are no restrictions for this procedure.  Your physician has recommended that you wear an event monitor. Event monitors are medical devices that record the heart's electrical activity. Doctors most often [name] these monitors to diagnose arrhythmias. Arrhythmias are problems with the speed or rhythm of the heartbeat. The monitor is a small, portable device. You can wear one while you do your normal daily activities. This is usually used to diagnose what is causing palpitations/syncope (passing out).  Your physician recommends that you return for lab work today for BNP and CBC.

## 2013-03-07 ENCOUNTER — Other Ambulatory Visit: Payer: Self-pay | Admitting: Internal Medicine

## 2013-03-07 ENCOUNTER — Ambulatory Visit
Admission: RE | Admit: 2013-03-07 | Discharge: 2013-03-07 | Disposition: A | Discharge status: Home / Self Care | Payer: 59 | Source: Ambulatory Visit | Attending: Internal Medicine | Admitting: Internal Medicine

## 2013-03-07 DIAGNOSIS — M1710 Unilateral primary osteoarthritis, unspecified knee: Secondary | ICD-10-CM | POA: Diagnosis not present

## 2013-03-07 DIAGNOSIS — R109 Unspecified abdominal pain: Secondary | ICD-10-CM

## 2013-03-07 DIAGNOSIS — IMO0002 Reserved for concepts with insufficient information to code with codable children: Secondary | ICD-10-CM | POA: Diagnosis not present

## 2013-03-07 DIAGNOSIS — M25569 Pain in unspecified knee: Secondary | ICD-10-CM | POA: Diagnosis not present

## 2013-03-13 DIAGNOSIS — N3941 Urge incontinence: Secondary | ICD-10-CM | POA: Diagnosis not present

## 2013-03-13 DIAGNOSIS — R3915 Urgency of urination: Secondary | ICD-10-CM | POA: Diagnosis not present

## 2013-03-14 ENCOUNTER — Ambulatory Visit (HOSPITAL_COMMUNITY): Payer: 59 | Attending: Interventional Cardiology | Admitting: Radiology

## 2013-03-14 ENCOUNTER — Encounter: Payer: Self-pay | Admitting: Interventional Cardiology

## 2013-03-14 ENCOUNTER — Encounter: Payer: Self-pay | Admitting: *Deleted

## 2013-03-14 ENCOUNTER — Encounter (INDEPENDENT_AMBULATORY_CARE_PROVIDER_SITE_OTHER): Payer: 59

## 2013-03-14 DIAGNOSIS — R0602 Shortness of breath: Secondary | ICD-10-CM | POA: Insufficient documentation

## 2013-03-14 DIAGNOSIS — Z8673 Personal history of transient ischemic attack (TIA), and cerebral infarction without residual deficits: Secondary | ICD-10-CM | POA: Diagnosis not present

## 2013-03-14 DIAGNOSIS — R002 Palpitations: Secondary | ICD-10-CM

## 2013-03-14 DIAGNOSIS — E119 Type 2 diabetes mellitus without complications: Secondary | ICD-10-CM | POA: Insufficient documentation

## 2013-03-14 DIAGNOSIS — I10 Essential (primary) hypertension: Secondary | ICD-10-CM | POA: Insufficient documentation

## 2013-03-14 NOTE — Progress Notes (Signed)
Patient ID: Deanna Rowe, female   DOB: 02-Aug-1955, 57 y.o.   MRN: [id number] E-Cardio veritie 30 day cardiac event monitor applied to patient.  Note: no [employer] monitors available, delivery pending.

## 2013-03-14 NOTE — Progress Notes (Signed)
Echocardiogram performed.  

## 2013-03-15 ENCOUNTER — Telehealth: Payer: Self-pay | Admitting: Cardiology

## 2013-03-15 NOTE — Telephone Encounter (Signed)
Okay. Continue monitor.

## 2013-03-15 NOTE — Telephone Encounter (Addendum)
Spoke with pt and she states she didn't pass out. She was walking fast to get to the bathroom and she was sitting down when she felt SOB and diaphretic. Pt states the episode lasted about 15 minutes. Currently pt is feeling fine.

## 2013-03-18 NOTE — Telephone Encounter (Signed)
Pt.notified

## 2013-03-25 DIAGNOSIS — F330 Major depressive disorder, recurrent, mild: Secondary | ICD-10-CM | POA: Diagnosis not present

## 2013-03-26 DIAGNOSIS — M25559 Pain in unspecified hip: Secondary | ICD-10-CM | POA: Diagnosis not present

## 2013-04-03 ENCOUNTER — Other Ambulatory Visit (HOSPITAL_COMMUNITY): Payer: Self-pay | Admitting: Orthopedic Surgery

## 2013-04-03 DIAGNOSIS — M25552 Pain in left hip: Secondary | ICD-10-CM

## 2013-04-04 DIAGNOSIS — I739 Peripheral vascular disease, unspecified: Secondary | ICD-10-CM | POA: Insufficient documentation

## 2013-04-10 DIAGNOSIS — N3941 Urge incontinence: Secondary | ICD-10-CM | POA: Diagnosis not present

## 2013-04-10 DIAGNOSIS — R3915 Urgency of urination: Secondary | ICD-10-CM | POA: Diagnosis not present

## 2013-04-15 ENCOUNTER — Telehealth: Payer: Self-pay | Admitting: Interventional Cardiology

## 2013-04-15 ENCOUNTER — Ambulatory Visit (HOSPITAL_COMMUNITY)
Admission: RE | Admit: 2013-04-15 | Discharge: 2013-04-15 | Disposition: A | Discharge status: Home / Self Care | Payer: 59 | Source: Ambulatory Visit | Attending: Orthopedic Surgery | Admitting: Orthopedic Surgery

## 2013-04-15 DIAGNOSIS — M1610 Unilateral primary osteoarthritis, unspecified hip: Secondary | ICD-10-CM | POA: Diagnosis not present

## 2013-04-15 DIAGNOSIS — M25559 Pain in unspecified hip: Secondary | ICD-10-CM | POA: Insufficient documentation

## 2013-04-15 DIAGNOSIS — Z96649 Presence of unspecified artificial hip joint: Secondary | ICD-10-CM | POA: Insufficient documentation

## 2013-04-15 DIAGNOSIS — M87059 Idiopathic aseptic necrosis of unspecified femur: Secondary | ICD-10-CM | POA: Insufficient documentation

## 2013-04-15 DIAGNOSIS — M169 Osteoarthritis of hip, unspecified: Secondary | ICD-10-CM | POA: Diagnosis not present

## 2013-04-15 DIAGNOSIS — M25552 Pain in left hip: Secondary | ICD-10-CM

## 2013-04-15 NOTE — Telephone Encounter (Signed)
New message   C/O sob over the weekend. Heart racing 123-130 . Having sob now.

## 2013-04-15 NOTE — Telephone Encounter (Signed)
SPOKE  WITH  PT , CONTINUES TO COMPLAIN OF  EXTREME  SOB  WITH  VERY LITTLE  ACTIVITY  HAS TO STOP  AND  REST . ALSO CONTINUES  TO  C/O  ELEVATED  HEART  RATE   O2  WAS  98% PER PT   . PT  AWARE  ECHO AND LABS  LOOK  GOOD   MONITOR  WAS  TURNED IN SAT  HAS NOT  BEEN REVIEWED   INFORMED PT  THAT MONITORING  COMPANY   WOULD INFORM   OFFICE  IF   RECEIVED  ABNORMAL  RECORDING  WILL FORWARD TO DR  Lacie Draft  FOR  RECOMMENDATIONS./CY

## 2013-04-15 NOTE — Progress Notes (Signed)
Patient ID: Deanna Rowe, female   DOB: 11/21/1955, 57 y.o.   MRN: [id number] ATTENDING PHYSICIAN NOTE: I have reviewed the chart and agree with the plan as detailed above. Lindsay Straka MD Pager [phone]  

## 2013-04-17 DIAGNOSIS — N3941 Urge incontinence: Secondary | ICD-10-CM | POA: Diagnosis not present

## 2013-04-17 DIAGNOSIS — R3915 Urgency of urination: Secondary | ICD-10-CM | POA: Diagnosis not present

## 2013-04-19 NOTE — Telephone Encounter (Signed)
Dr. Hassell Done Interpretation of 30 day heart monitor: NSR, no pathological arrythmia's. Called pt and lm to return call.

## 2013-04-19 NOTE — Telephone Encounter (Signed)
Pt notified of heart monitor results. Pt still has problems with her heart racing and getting very SOB with walking around the house especially going up the steps.

## 2013-04-25 ENCOUNTER — Ambulatory Visit (INDEPENDENT_AMBULATORY_CARE_PROVIDER_SITE_OTHER): Payer: 59 | Admitting: Interventional Cardiology

## 2013-04-25 ENCOUNTER — Encounter: Payer: Self-pay | Admitting: Interventional Cardiology

## 2013-04-25 VITALS — BP 140/98 | HR 80 | Ht 66.0 in | Wt 215.8 lb

## 2013-04-25 DIAGNOSIS — R0602 Shortness of breath: Secondary | ICD-10-CM

## 2013-04-25 NOTE — Patient Instructions (Signed)
Your physician has recommended that you have a pulmonary function test. Pulmonary Function Tests are a group of tests that measure how well air moves in and out of your lungs.   

## 2013-04-25 NOTE — Progress Notes (Signed)
Patient ID: Deanna Rowe, female   DOB: 04-Jul-1955, 58 y.o.   MRN: [id number]    [place] [place], [place]  [address] Phone: [phone] Fax:  [phone]  Date:  04/25/2013   ID:  Deanna Rowe, DOB 05-22-1955, MRN [id number]  PCP:  Henrine Screws, MD      History of Present Illness: Deanna Rowe is a 58 y.o. female who has had intermittent dizziness. It can occur with walking or when she stands up quickly. No syncope. She has fallen after getting up from bed. She did not hurt herself. She has had some palpitations. Dizziness is there daily. With the palpitatins, she can get sweaty.  Her most physical activity is housework and walking up stairs. She has had to stop these activities after a few minutes. SHe has to stop walking after one flight of stairs.   She had a negative echo and event monitor.  SHe continues to have SHOB, worse with walking.       Wt Readings from Last 3 Encounters:  04/25/13 215 lb 12.8 oz (97.886 kg)  02/22/13 211 lb (95.709 kg)  09/03/12 213 lb (96.616 kg)     Past Medical History  Diagnosis Date  . Diabetes mellitus   . Hypertension   . Asthma   . Obesity   . Stroke 02/11/2012    Pt reports Hx of TIA's  . Anxiety   . Depression   . Diabetes mellitus, type II     Current Outpatient Prescriptions  Medication Sig Dispense Refill  . ALPRAZolam (XANAX) 1 MG tablet Take 1 mg by mouth daily.       Marland Kitchen amLODipine (NORVASC) 5 MG tablet Take 5 mg by mouth daily.      . ARIPiprazole (ABILIFY) 5 MG tablet Take 2.5 mg by mouth daily.      Marland Kitchen aspirin EC 81 MG tablet Take 81 mg by mouth daily.      Marland Kitchen buPROPion (WELLBUTRIN XL) 150 MG 24 hr tablet Take 450 mg by mouth daily.      . Calcium Carbonate-Vitamin D (CALCIUM + D PO) Take by mouth.        . Cholecalciferol (VITAMIN D) 1000 UNITS capsule Take 1,000 Units by mouth daily.      . ClonazePAM (KLONOPIN PO) Take 0.5 mg by mouth 2 (two) times daily. For anxiety      .  DULoxetine (CYMBALTA) 60 MG capsule Take 60 mg by mouth 2 (two) times daily.       Marland Kitchen gabapentin (NEURONTIN) 300 MG capsule Take 300 mg by mouth daily.       . metFORMIN (GLUCOPHAGE) 500 MG tablet Take 500 mg by mouth 2 (two) times daily after a meal.       . pantoprazole (PROTONIX) 40 MG tablet Take 40 mg by mouth daily.      Marland Kitchen ROPINIRole HCl (REQUIP PO) Take 2 mg by mouth 2 (two) times daily.       . valACYclovir (VALTREX) 1000 MG tablet Take 1,000 mg by mouth daily.      . valsartan-hydrochlorothiazide (DIOVAN-HCT) 160-12.5 MG per tablet Take 1 tablet by mouth daily. 1/2 tab daily on mon, wed, fri      . zolpidem (AMBIEN) 5 MG tablet Take 5 mg by mouth at bedtime as needed.       No current facility-administered medications for this visit.    Allergies:    Allergies  Allergen Reactions  . Codeine  Nausea And Vomiting  . Vicodin [Hydrocodone-Acetaminophen]   . Amoxicillin Rash    Social History:  The patient  reports that she has never smoked. She does not have any smokeless tobacco history on file. She reports that she does not drink alcohol or use illicit drugs.   Family History:  The patient's family history includes Alcohol abuse in her brother, brother, brother, and brother.   ROS:  Please see the history of present illness.  No nausea, vomiting.  No fevers, chills.  No focal weakness.  No dysuria.   All other systems reviewed and negative.   PHYSICAL EXAM: VS:  BP 140/98  Pulse 80  Ht 5\' 6"  (1.676 m)  Wt 215 lb 12.8 oz (97.886 kg)  BMI 34.85 kg/m2 Well nourished, well developed, in no acute distress HEENT: normal Neck: no JVD, no carotid bruits Cardiac:  normal S1, S2; RRR;  Lungs:  clear to auscultation bilaterally, no wheezing, rhonchi or rales Abd: soft, nontender, no hepatomegaly Ext: no edema Skin: warm and dry Neuro:   no focal abnormalities noted     ASSESSMENT AND PLAN:  1) Dizziness: I have asked her to stay well hydrated by drinking plenty of water.  Unclear etiology.  2) palpitations: Negative event monitor.  3) shortness of breath: Normal  BNP. Normal EF by  echocardiogram . No signficant structural heart disease. Sinus tachycardia on resting EKG last time but now with normal resting HR. Check PFTs, lung volumes, DLCO. No cardiac etiology noted.  F/u based on PFTs.  Negative stress test in 2008.  No chest pain.     Signed, Mina Marble, MD, [facility] 04/25/2013 3:26 PM

## 2013-04-26 NOTE — Telephone Encounter (Signed)
Pt seen in office yesterday and PFT was ordered.

## 2013-04-30 DIAGNOSIS — M6281 Muscle weakness (generalized): Secondary | ICD-10-CM | POA: Diagnosis not present

## 2013-04-30 DIAGNOSIS — R3915 Urgency of urination: Secondary | ICD-10-CM | POA: Diagnosis not present

## 2013-04-30 DIAGNOSIS — N3941 Urge incontinence: Secondary | ICD-10-CM | POA: Diagnosis not present

## 2013-05-02 DIAGNOSIS — Z96649 Presence of unspecified artificial hip joint: Secondary | ICD-10-CM | POA: Diagnosis not present

## 2013-05-02 DIAGNOSIS — M1610 Unilateral primary osteoarthritis, unspecified hip: Secondary | ICD-10-CM | POA: Diagnosis not present

## 2013-05-02 DIAGNOSIS — M169 Osteoarthritis of hip, unspecified: Secondary | ICD-10-CM | POA: Diagnosis not present

## 2013-05-08 DIAGNOSIS — R3915 Urgency of urination: Secondary | ICD-10-CM | POA: Diagnosis not present

## 2013-05-08 DIAGNOSIS — N3941 Urge incontinence: Secondary | ICD-10-CM | POA: Diagnosis not present

## 2013-05-08 DIAGNOSIS — M6281 Muscle weakness (generalized): Secondary | ICD-10-CM | POA: Diagnosis not present

## 2013-05-12 ENCOUNTER — Encounter: Payer: Self-pay | Admitting: Family Medicine

## 2013-05-14 ENCOUNTER — Telehealth: Payer: Self-pay | Admitting: Interventional Cardiology

## 2013-05-14 ENCOUNTER — Ambulatory Visit (INDEPENDENT_AMBULATORY_CARE_PROVIDER_SITE_OTHER): Payer: 59 | Admitting: Family Medicine

## 2013-05-14 DIAGNOSIS — E119 Type 2 diabetes mellitus without complications: Secondary | ICD-10-CM

## 2013-05-14 NOTE — Telephone Encounter (Signed)
We are working on getting pt seen sooner for PFT.

## 2013-05-14 NOTE — Telephone Encounter (Signed)
Pt called today because she states last Thursday had an episode like she could not breath. Her husband gave his inhaler  to use because he has asthma she used two puffs it did help some. She states for the last 2 weeks her breathing has been getting worse and her chest feels tight. Pt called the office where the Pulmonary function test is going to be done to see if the can do it sooner then 2/24' but there are no openings before then. Pt thinks she won't be able to make it until then, because when she has dose episodes she feels she can't take another breath and feels like she is going to die. Pt would like to know if MD knows  another place where  this test can be done ASAP.

## 2013-05-14 NOTE — Telephone Encounter (Signed)
PFT moved up to 05/16/13 at 2:45pm at [facility] 1st floor admitting. Per Dr. Irish Lack if pt is in distress she needs to go to the ED before appt.

## 2013-05-14 NOTE — Telephone Encounter (Signed)
Pt aware and verbalized understanding.  

## 2013-05-14 NOTE — Progress Notes (Signed)
Patient presents today for DM follow-up as part of employer-sponsored Link to Wellness Program. Medications, glucose readings, a1c and compliance have been reviewed. I have also discussed with patient lifestyle interventions including diet and exercise. Details of the visit can be found in [employer] documenting program through [facility] Patient has set a series of personal goals and will follow-up in no more than 3 months for further review of DM. 

## 2013-05-14 NOTE — Telephone Encounter (Signed)
New problem   Pt is having sob..really hard time breathing.

## 2013-05-16 ENCOUNTER — Ambulatory Visit (HOSPITAL_COMMUNITY)
Admission: RE | Admit: 2013-05-16 | Discharge: 2013-05-16 | Disposition: A | Discharge status: Home / Self Care | Payer: 59 | Source: Ambulatory Visit | Attending: Interventional Cardiology | Admitting: Interventional Cardiology

## 2013-05-16 DIAGNOSIS — R0602 Shortness of breath: Secondary | ICD-10-CM | POA: Diagnosis not present

## 2013-05-16 DIAGNOSIS — R05 Cough: Secondary | ICD-10-CM | POA: Insufficient documentation

## 2013-05-16 DIAGNOSIS — R0989 Other specified symptoms and signs involving the circulatory and respiratory systems: Secondary | ICD-10-CM | POA: Insufficient documentation

## 2013-05-16 DIAGNOSIS — R059 Cough, unspecified: Secondary | ICD-10-CM | POA: Diagnosis not present

## 2013-05-16 DIAGNOSIS — R0609 Other forms of dyspnea: Secondary | ICD-10-CM | POA: Diagnosis not present

## 2013-05-16 LAB — PULMONARY FUNCTION TEST
DL/VA % pred: 102 %
DL/VA: 5.29 ml/min/mmHg/L
DLCO cor % pred: 73 %
DLCO cor: 20.86 ml/min/mmHg
DLCO unc % pred: 73 %
DLCO unc: 20.86 ml/min/mmHg
FEF 25-75 Post: 2.51 L/sec
FEF 25-75 Pre: 2.11 L/sec
FEF2575-%Change-Post: 19 %
FEF2575-%Pred-Post: 106 %
FEF2575-%Pred-Pre: 89 %
FEV1-%Change-Post: 3 %
FEV1-%Pred-Post: 94 %
FEV1-%Pred-Pre: 91 %
FEV1-Post: 2.27 L
FEV1-Pre: 2.2 L
FEV1FVC-%Change-Post: -2 %
FEV1FVC-%Pred-Pre: 98 %
FEV6-%Change-Post: 6 %
FEV6-%Pred-Post: 98 %
FEV6-%Pred-Pre: 92 %
FEV6-Post: 2.92 L
FEV6-Pre: 2.74 L
FEV6FVC-%Change-Post: 1 %
FEV6FVC-%Pred-Post: 102 %
FEV6FVC-%Pred-Pre: 100 %
FVC-%Change-Post: 5 %
FVC-%Pred-Post: 96 %
FVC-%Pred-Pre: 91 %
FVC-Post: 2.94 L
FVC-Pre: 2.8 L
Post FEV1/FVC ratio: 77 %
Post FEV6/FVC ratio: 99 %
Pre FEV1/FVC ratio: 79 %
Pre FEV6/FVC Ratio: 98 %
RV % pred: 80 %
RV: 1.68 L
TLC % pred: 82 %
TLC: 4.53 L

## 2013-05-16 MED ORDER — ALBUTEROL SULFATE (2.5 MG/3ML) 0.083% IN NEBU
2.5000 mg | INHALATION_SOLUTION | Freq: Once | RESPIRATORY_TRACT | Status: AC
  Administered 2013-05-16: 2.5 mg via RESPIRATORY_TRACT

## 2013-05-24 DIAGNOSIS — M6281 Muscle weakness (generalized): Secondary | ICD-10-CM | POA: Diagnosis not present

## 2013-05-24 DIAGNOSIS — R279 Unspecified lack of coordination: Secondary | ICD-10-CM | POA: Diagnosis not present

## 2013-05-24 DIAGNOSIS — R3915 Urgency of urination: Secondary | ICD-10-CM | POA: Diagnosis not present

## 2013-05-24 DIAGNOSIS — N3941 Urge incontinence: Secondary | ICD-10-CM | POA: Diagnosis not present

## 2013-05-31 DIAGNOSIS — IMO0002 Reserved for concepts with insufficient information to code with codable children: Secondary | ICD-10-CM | POA: Insufficient documentation

## 2013-05-31 DIAGNOSIS — E1165 Type 2 diabetes mellitus with hyperglycemia: Secondary | ICD-10-CM | POA: Insufficient documentation

## 2013-06-03 ENCOUNTER — Telehealth: Payer: Self-pay | Admitting: Interventional Cardiology

## 2013-06-03 NOTE — Telephone Encounter (Signed)
To Dr. Varanasi. 

## 2013-06-03 NOTE — Telephone Encounter (Signed)
Send PFTs to her PMD and get his interpretation and input.  We have not found a cardiac cause of SHOB.

## 2013-06-03 NOTE — Telephone Encounter (Signed)
New message     Still sob---want Dr V to go over PFT results with her

## 2013-06-03 NOTE — Telephone Encounter (Signed)
Faxed PFT to Dr. Inda Merlin again and pt aware.

## 2013-06-04 DIAGNOSIS — M169 Osteoarthritis of hip, unspecified: Secondary | ICD-10-CM | POA: Diagnosis not present

## 2013-06-04 DIAGNOSIS — M1610 Unilateral primary osteoarthritis, unspecified hip: Secondary | ICD-10-CM | POA: Diagnosis not present

## 2013-06-11 NOTE — Progress Notes (Signed)
Patient ID: Deanna Rowe, female   DOB: 10/30/1955, 57 y.o.   MRN: [id number] ATTENDING PHYSICIAN NOTE: I have reviewed the chart and agree with the plan as detailed above. Kolbey Teichert MD Pager [phone]  

## 2013-06-18 ENCOUNTER — Ambulatory Visit (INDEPENDENT_AMBULATORY_CARE_PROVIDER_SITE_OTHER)
Admission: RE | Admit: 2013-06-18 | Discharge: 2013-06-18 | Disposition: A | Discharge status: Home / Self Care | Payer: 59 | Source: Ambulatory Visit | Attending: Pulmonary Disease | Admitting: Pulmonary Disease

## 2013-06-18 ENCOUNTER — Other Ambulatory Visit: Payer: 59

## 2013-06-18 ENCOUNTER — Encounter: Payer: Self-pay | Admitting: Pulmonary Disease

## 2013-06-18 ENCOUNTER — Ambulatory Visit (INDEPENDENT_AMBULATORY_CARE_PROVIDER_SITE_OTHER): Payer: 59 | Admitting: Pulmonary Disease

## 2013-06-18 VITALS — BP 124/76 | HR 98 | Temp 98.0°F | Ht 66.0 in | Wt 216.0 lb

## 2013-06-18 DIAGNOSIS — R0602 Shortness of breath: Secondary | ICD-10-CM

## 2013-06-18 MED ORDER — E-Z SPACER DEVI: Status: DC

## 2013-06-18 MED ORDER — ALBUTEROL SULFATE HFA 108 (90 BASE) MCG/ACT IN AERS: 2.0000 | INHALATION_SPRAY | Freq: Four times a day (QID) | RESPIRATORY_TRACT | Status: DC | PRN

## 2013-06-18 MED ORDER — AEROCHAMBER MINI CHAMBER DEVI: Status: DC

## 2013-06-18 NOTE — Assessment & Plan Note (Signed)
At this point there is no clear predominant cause of her dyspnea.  Her PFTs suggested small airways disease which can be see in asthma, bronchiolitis, and in active smokers.  Despite this her FEV 1 was normal and she is not wheezing on exam.  Further it would be somewhat unusual for asthma to present at this phase of life. I am encouraged that her lung exam and oximetry were normal today on exam.  Dyspnea has a broad ddx and includes cardiac disease, anemia, and neuromuscular disease.  Her recent echo, event monitor and cardiology clinic visit were unremarkable.  Her neurologic exam is normal.    I explained to her that she is likely deconditioned, but we need to rule out lung disease before   Plan: -at this point will treat as asthma with inhaled steroids, though I have changed her inhaler to QVar as it is a small particle inhaler; will have her use it with a spacer -Check CXR -check CBC -encourage regular exercise -if no improvement with exercise and QVar, set up CPET testing

## 2013-06-18 NOTE — Patient Instructions (Addendum)
We will call you with the results of your blood work and your CXR Stop the Advair, try changing to QVar 76mcg 2 puffs twice per day with the spacer Use the albuterol 2 puffs prior to exercise and every four hours as needed for shortness of breath Provide [name] with a sample of your mucus so we can culture it If you are not feeling better on the QVar, then resume advair and let us know so we can give you a sample of the Advair HFA inhaler Try to exercise regularly If you are not feeling better in 2-3 weeks let us know and we can arrange a cardiopulmonary exercise test We will see you back in 4-6 weeks or sooner if needed

## 2013-06-18 NOTE — Progress Notes (Signed)
Subjective:    Patient ID: Deanna Rowe, female    DOB: 03-06-1956, 58 y.o.   MRN: [id number]  HPI  Deanna Rowe is here to see me for shortness of breath. She has noted dyspne on exertion with walking requiring her to stop and rest.  For example she was doing grocery shopping two weeks ago and started feeling like she couldn't get enough breath while up and walking.  She had been to several stores that day.  She had to walk from the end of the parking lot into the store.  Lately family has been dropping her off at the front of the store because of this.  She first noticed this in January and Christmas 2014 was fine in that she did not have respiratory symptoms at that point.  She did have to see a cardiologist in December 2014 because of a fast heart rate.  She was given a heart monitor because of this.  Apparently she had one episode of sinus tachycardia but nothing abnormal.  Today for example she was out of breath after just walking 10-15 yards.    In the last few months she hasn't had a change in her weight.  She has not had chest pain but she has noted chest tightness.   A few weeks ago she took her husband's Advair because she was feeling so short or breath.  She felt like it helped her breath better.  She was prescribed this by her primary care physician lately.  She feels like she is coughing worse on this meidcine and she has been cougihg up clear thick mucus every night.    She denies heart burn or sinus congestion.    She had pneumonia as a baby, but she really doesn't know details.  After that she has had breathing trouble.  Past Medical History  Diagnosis Date  . Diabetes mellitus   . Hypertension   . Asthma   . Obesity   . Stroke 02/11/2012    Pt reports Hx of TIA's  . Anxiety   . Depression   . Diabetes mellitus, type II      Family History  Problem Relation Age of Onset  . Alcohol abuse Brother   . Alcohol abuse Brother   . Alcohol abuse Brother   . Alcohol  abuse Brother      History   Social History  . Marital Status: Married    Spouse Name: N/A    Number of Children: N/A  . Years of Education: N/A   Occupational History  . Not on file.   Social History Main Topics  . Smoking status: Never Smoker   . Smokeless tobacco: Not on file  . Alcohol Use: No  . Drug Use: No  . Sexual Activity: Not Currently   Other Topics Concern  . Not on file   Social History Narrative  . No narrative on file     Allergies  Allergen Reactions  . Codeine Nausea And Vomiting  . Amoxicillin Rash     Outpatient Prescriptions Prior to Visit  Medication Sig Dispense Refill  . ALPRAZolam (XANAX) 1 MG tablet Take 1 mg by mouth daily.       Marland Kitchen amLODipine (NORVASC) 5 MG tablet Take 5 mg by mouth daily.      . ARIPiprazole (ABILIFY) 5 MG tablet Take 2.5 mg by mouth daily.      Marland Kitchen aspirin EC 81 MG tablet Take 81 mg by mouth daily.      Marland Kitchen  buPROPion (WELLBUTRIN XL) 150 MG 24 hr tablet Take 450 mg by mouth daily.      . Calcium Carbonate-Vitamin D (CALCIUM + D PO) Take by mouth.        . Canagliflozin-Metformin HCl (INVOKAMET) [phone] MG TABS Take 1 tablet by mouth daily.      . Cholecalciferol (VITAMIN D) 1000 UNITS capsule Take 1,000 Units by mouth daily.      . ClonazePAM (KLONOPIN PO) Take 0.5 mg by mouth 2 (two) times daily. For anxiety      . DULoxetine (CYMBALTA) 60 MG capsule Take 60 mg by mouth 2 (two) times daily.       Marland Kitchen gabapentin (NEURONTIN) 300 MG capsule Take 300 mg by mouth daily.       Marland Kitchen loratadine-pseudoephedrine (CLARITIN-D 12-HOUR) 5-120 MG per tablet Take 1 tablet by mouth as needed.       . pantoprazole (PROTONIX) 40 MG tablet Take 40 mg by mouth 2 (two) times daily.       Marland Kitchen ROPINIRole HCl (REQUIP PO) Take 2 mg by mouth 2 (two) times daily.       Marland Kitchen topiramate (TOPAMAX) 25 MG tablet Take 25 mg by mouth 2 (two) times daily.      . valACYclovir (VALTREX) 1000 MG tablet Take 1,000 mg by mouth daily.      . valsartan-hydrochlorothiazide  (DIOVAN-HCT) 160-12.5 MG per tablet Take 1 tablet by mouth daily. 1/2 tab daily on mon, wed, fri - 0 other days      . zolpidem (AMBIEN) 5 MG tablet Take 5 mg by mouth at bedtime as needed.      . metFORMIN (GLUCOPHAGE) 500 MG tablet Take 500 mg by mouth 2 (two) times daily after a meal.       . triamcinolone (NASACORT ALLERGY 24HR) 55 MCG/ACT AERO nasal inhaler Place 1 spray into the nose at bedtime.       No facility-administered medications prior to visit.      Review of Systems  Constitutional: Negative for fever, chills, diaphoresis, activity change, appetite change, fatigue and unexpected weight change.  HENT: Negative for congestion, dental problem, ear discharge, ear pain, facial swelling, hearing loss, mouth sores, nosebleeds, postnasal drip, rhinorrhea, sinus pressure, sneezing, sore throat, tinnitus, trouble swallowing and voice change.   Eyes: Negative for photophobia, discharge, itching and visual disturbance.  Respiratory: Positive for cough and shortness of breath. Negative for apnea, choking, chest tightness, wheezing and stridor.   Cardiovascular: Positive for palpitations. Negative for chest pain and leg swelling.  Gastrointestinal: Negative for nausea, vomiting, abdominal pain, constipation, blood in stool and abdominal distention.  Genitourinary: Negative for dysuria, urgency, frequency, hematuria, flank pain, decreased urine volume and difficulty urinating.  Musculoskeletal: Positive for arthralgias and joint swelling. Negative for back pain, gait problem, myalgias, neck pain and neck stiffness.  Skin: Negative for color change, pallor and rash.  Neurological: Negative for dizziness, tremors, seizures, syncope, speech difficulty, weakness, light-headedness, numbness and headaches.  Hematological: Negative for adenopathy. Does not bruise/bleed easily.  Psychiatric/Behavioral: Negative for confusion, sleep disturbance and agitation. The patient is not nervous/anxious.         Objective:   Physical Exam Filed Vitals:   06/18/13 1433 06/18/13 1435  BP:  124/76  Pulse:  98  Temp: 98 F (36.7 C)   TempSrc: Oral   Height: 5\' 6"  (1.676 m)   Weight: 216 lb (97.977 kg)   SpO2:  98%   RA  Gen: well appearing, no acute distress HEENT:  NCAT, PERRL, EOMi, OP clear, neck supple without masses PULM: CTA B CV: RRR, no mgr, no JVD AB: BS+, soft, nontender, no hsm Ext: warm, no edema, no clubbing, no cyanosis Derm: no rash or skin breakdown Neuro: A&Ox4, CN II-XII intact, strength 5/5 in all 4 extremities       Assessment & Plan:   Shortness of breath At this point there is no clear predominant cause of her dyspnea.  Her PFTs suggested small airways disease which can be see in asthma, bronchiolitis, and in active smokers.  Despite this her FEV 1 was normal and she is not wheezing on exam.  Further it would be somewhat unusual for asthma to present at this phase of life. I am encouraged that her lung exam and oximetry were normal today on exam.  Dyspnea has a broad ddx and includes cardiac disease, anemia, and neuromuscular disease.  Her recent echo, event monitor and cardiology clinic visit were unremarkable.  Her neurologic exam is normal.    I explained to her that she is likely deconditioned, but we need to rule out lung disease before   Plan: -at this point will treat as asthma with inhaled steroids, though I have changed her inhaler to QVar as it is a small particle inhaler; will have her use it with a spacer -Check CXR -check CBC -encourage regular exercise -if no improvement with exercise and QVar, set up CPET testing   Updated Medication List Outpatient Encounter Prescriptions as of 06/18/2013  Medication Sig  . ALPRAZolam (XANAX) 1 MG tablet Take 1 mg by mouth daily.   Marland Kitchen amLODipine (NORVASC) 5 MG tablet Take 5 mg by mouth daily.  . ARIPiprazole (ABILIFY) 5 MG tablet Take 2.5 mg by mouth daily.  Marland Kitchen aspirin EC 81 MG tablet Take 81 mg by mouth  daily.  Marland Kitchen buPROPion (WELLBUTRIN XL) 150 MG 24 hr tablet Take 450 mg by mouth daily.  . Calcium Carbonate-Vitamin D (CALCIUM + D PO) Take by mouth.    . Canagliflozin-Metformin HCl (INVOKAMET) [phone] MG TABS Take 1 tablet by mouth daily.  . Cholecalciferol (VITAMIN D) 1000 UNITS capsule Take 1,000 Units by mouth daily.  . ClonazePAM (KLONOPIN PO) Take 0.5 mg by mouth 2 (two) times daily. For anxiety  . DULoxetine (CYMBALTA) 60 MG capsule Take 60 mg by mouth 2 (two) times daily.   Marland Kitchen gabapentin (NEURONTIN) 300 MG capsule Take 300 mg by mouth daily.   Marland Kitchen loratadine-pseudoephedrine (CLARITIN-D 12-HOUR) 5-120 MG per tablet Take 1 tablet by mouth as needed.   . pantoprazole (PROTONIX) 40 MG tablet Take 40 mg by mouth 2 (two) times daily.   Marland Kitchen ROPINIRole HCl (REQUIP PO) Take 2 mg by mouth 2 (two) times daily.   Marland Kitchen topiramate (TOPAMAX) 25 MG tablet Take 25 mg by mouth 2 (two) times daily.  . valACYclovir (VALTREX) 1000 MG tablet Take 1,000 mg by mouth daily.  . valsartan-hydrochlorothiazide (DIOVAN-HCT) 160-12.5 MG per tablet Take 1 tablet by mouth daily. 1/2 tab daily on mon, wed, fri - 0 other days  . zolpidem (AMBIEN) 5 MG tablet Take 5 mg by mouth at bedtime as needed.  Marland Kitchen albuterol (PROVENTIL HFA;VENTOLIN HFA) 108 (90 BASE) MCG/ACT inhaler Inhale 2 puffs into the lungs every 6 (six) hours as needed for wheezing or shortness of breath.  . Spacer/Aero-Holding Chambers (AEROCHAMBER MINI CHAMBER) DEVI Use as directed  . [DISCONTINUED] metFORMIN (GLUCOPHAGE) 500 MG tablet Take 500 mg by mouth 2 (two) times daily after a meal.   . [  DISCONTINUED] Spacer/Aero-Holding Chambers (E-Z SPACER) inhaler Use as instructed  . [DISCONTINUED] Spacer/Aero-Holding Chambers (E-Z SPACER) inhaler Use as instructed  . [DISCONTINUED] triamcinolone (NASACORT ALLERGY 24HR) 55 MCG/ACT AERO nasal inhaler Place 1 spray into the nose at bedtime.

## 2013-06-19 LAB — IGE: IgE (Immunoglobulin E), Serum: 21.2 IU/mL (ref 0.0–180.0)

## 2013-06-21 LAB — RESPIRATORY CULTURE OR RESPIRATORY AND SPUTUM CULTURE: Organism ID, Bacteria: NORMAL

## 2013-06-26 DIAGNOSIS — M169 Osteoarthritis of hip, unspecified: Secondary | ICD-10-CM | POA: Diagnosis not present

## 2013-06-26 DIAGNOSIS — M1610 Unilateral primary osteoarthritis, unspecified hip: Secondary | ICD-10-CM | POA: Diagnosis not present

## 2013-06-28 ENCOUNTER — Telehealth: Payer: Self-pay | Admitting: Pulmonary Disease

## 2013-06-28 MED ORDER — BENZONATATE 100 MG PO CAPS: 100.0000 mg | ORAL_CAPSULE | Freq: Three times a day (TID) | ORAL | Status: DC | PRN

## 2013-06-28 NOTE — Telephone Encounter (Signed)
Tessalon 100mg  po tid prn cough dispense #30, refill #1  Explain to her that my rule is I never prescribe narcotics after just one visit

## 2013-06-28 NOTE — Telephone Encounter (Signed)
Called spoke with patient who reported her cough and dyspnea are improved since her last ov w/ BQ on 3.17.15.  She does still report a prod cough with thick clear mucus at night and is requesting a prescription cough syrup to assist with this.  Pt does have a documented medication allergy to codeine but pt stated that she can tolerate hydrocodone as she is currently taking this medication for hip pain (not on pt's med list).  Dr Lake Bells please advise, thank you.  Allergies  Allergen Reactions  . Codeine Nausea And Vomiting  . Amoxicillin Rash   Per 3.17.15 ov w/ BQ: Patient Instructions     We will call you with the results of your blood work and your CXR  Stop the Advair, try changing to QVar 53mcg 2 puffs twice per day with the spacer  Use the albuterol 2 puffs prior to exercise and every four hours as needed for shortness of breath  Provide [name] with a sample of your mucus so we can culture it  If you are not feeling better on the QVar, then resume advair and let us know so we can give you a sample of the Advair HFA inhaler  Try to exercise regularly  If you are not feeling better in 2-3 weeks let us know and we can arrange a cardiopulmonary exercise test  We will see you back in 4-6 weeks or sooner if needed

## 2013-06-28 NOTE — Telephone Encounter (Signed)
Called and made pt aware. RX has been sent to [place].

## 2013-07-02 ENCOUNTER — Telehealth: Payer: Self-pay | Admitting: Interventional Cardiology

## 2013-07-02 NOTE — Telephone Encounter (Signed)
Received request from Nurse fax box, documents faxed for surgical clearance. To: [employer] Fax number: [phone] Attention: [phone]/kdm

## 2013-07-11 DIAGNOSIS — N3941 Urge incontinence: Secondary | ICD-10-CM | POA: Diagnosis not present

## 2013-07-11 DIAGNOSIS — R279 Unspecified lack of coordination: Secondary | ICD-10-CM | POA: Diagnosis not present

## 2013-07-11 DIAGNOSIS — M6281 Muscle weakness (generalized): Secondary | ICD-10-CM | POA: Diagnosis not present

## 2013-07-11 DIAGNOSIS — R3915 Urgency of urination: Secondary | ICD-10-CM | POA: Diagnosis not present

## 2013-07-12 ENCOUNTER — Telehealth: Payer: Self-pay | Admitting: Pulmonary Disease

## 2013-07-12 DIAGNOSIS — R0602 Shortness of breath: Secondary | ICD-10-CM

## 2013-07-12 NOTE — Telephone Encounter (Signed)
Last OV 06-18-13.  Pt states she is still having a lot of SOB with activity. Pt states she has tried to be more active but she is still having increased SOB. Pt also states she has dizziness at times when she gets SOB. She states she does not feel like she has improved much. Per last OV note: If you are not feeling better in 2-3 weeks let us know and we can arrange a cardiopulmonary exercise test  Please advise. [place] Bing, CMA

## 2013-07-15 NOTE — Telephone Encounter (Signed)
Please order CPET.  Thanks

## 2013-07-15 NOTE — Telephone Encounter (Signed)
Order has been placed. Please advise PCC's thanks 

## 2013-07-15 NOTE — Telephone Encounter (Signed)
Pt returned call and is aware of appointment on 07/18/13. Instructions and directions given to patient. Also mailed out instruction sheet to patient. Nothing else needed at this time. Rhonda J Cobb

## 2013-07-15 NOTE — Telephone Encounter (Signed)
CPX scheduled for Thurs 07/18/13 at 3:00 at [facility]. Pt to arrive at 2:45.  LMOAM for pt to return my call in reference to this appointment and instructions. Rhonda J Cobb

## 2013-07-16 LAB — CULTURE, FUNGUS WITHOUT SMEAR

## 2013-07-18 ENCOUNTER — Ambulatory Visit (HOSPITAL_COMMUNITY): Payer: 59 | Attending: Pulmonary Disease

## 2013-07-18 DIAGNOSIS — R0602 Shortness of breath: Secondary | ICD-10-CM

## 2013-07-22 DIAGNOSIS — F330 Major depressive disorder, recurrent, mild: Secondary | ICD-10-CM | POA: Diagnosis not present

## 2013-07-29 ENCOUNTER — Telehealth: Payer: Self-pay

## 2013-07-29 ENCOUNTER — Encounter: Payer: Self-pay | Admitting: Pulmonary Disease

## 2013-07-29 NOTE — Telephone Encounter (Signed)
Message copied by Len Blalock on Mon Jul 29, 2013  9:40 AM ------      Message from: Deanna Rowe      Created: Mon Jul 29, 2013  3:43 AM       A,            Please let her know that this test did not identify a clear lung or heart problem.              We can discuss more in clinic.            Thanks      Rowe ------

## 2013-07-29 NOTE — Telephone Encounter (Signed)
Pt aware of results.  Reminded her of appt tomorrow with BQ.  Nothing further needed.

## 2013-07-30 ENCOUNTER — Ambulatory Visit (INDEPENDENT_AMBULATORY_CARE_PROVIDER_SITE_OTHER): Payer: 59 | Admitting: Pulmonary Disease

## 2013-07-30 ENCOUNTER — Encounter: Payer: Self-pay | Admitting: Pulmonary Disease

## 2013-07-30 VITALS — BP 134/80 | HR 94 | Ht 66.0 in | Wt 211.0 lb

## 2013-07-30 DIAGNOSIS — R059 Cough, unspecified: Secondary | ICD-10-CM

## 2013-07-30 DIAGNOSIS — R0602 Shortness of breath: Secondary | ICD-10-CM

## 2013-07-30 DIAGNOSIS — R05 Cough: Secondary | ICD-10-CM | POA: Diagnosis not present

## 2013-07-30 NOTE — Progress Notes (Signed)
Subjective:    Patient ID: Deanna Rowe, female    DOB: Apr 21, 1955, 58 y.o.   MRN: [id number]  Synopsis: Referred in 2015 for shortness of breath. Pulmonary function testing suggested very mild small airways obstruction. Started on inhaled corticosteroids which made no difference. Had a CPET which showed very mild reduction in exercise capacity with preserved ventilatory function. It was felt that the exercise limitation was due to obesity.  HPI  07/30/2013 ROV > Snce the last visit Deanna Rowe feels that her shortness of breath has not improved. She has been using the steroid inhaler and has not made any difference. She has not been exercising do to various pains. She continues to be frustrated over her shortness of breath. She states that she has been coughing significantly in the evenings. She denies sinus drainage. She does note that she'll eat something not long before going to bed. She tries to prop her head up. She feels that her heartburn is fairly well controlled during the day. She does not have leg swelling or new chest pains. She has not been exercising regularly.  Past Medical History  Diagnosis Date  . Diabetes mellitus   . Hypertension   . Asthma   . Obesity   . Stroke 02/11/2012    Pt reports Hx of TIA's  . Anxiety   . Depression   . Diabetes mellitus, type II      Review of Systems     Objective:   Physical Exam Filed Vitals:   07/30/13 1445  BP: 134/80  Pulse: 94  Height: 5\' 6"  (1.676 m)  Weight: 211 lb (95.709 kg)  SpO2: 99%   Room air  Gen: well appearing, no acute distress HEENT: NCAT, EOMi, OP clear,  PULM: CTA B CV: RRR, no mgr, no JVD AB: BS+, soft, nontender, no hsm Ext: warm, no edema, no clubbing, no cyanosis Neuro: A&Ox4, MAEW       Assessment & Plan:   Shortness of breath Her pulmonary function testing really did not show much in the way of airflow obstruction. Further, during her cardiopulmonary exercise test you know she was  somewhat limited in her exercise capacity her ventilatory reserve was well preserved. So explained to her today that there is really no clear underlying lung disease at all. Further, given her recent cardiac workup which was normal I do not think that she has evidence of cardiac disease either.  I explained to her that I think the most likely etiology for her shortness of breath is deconditioning. She is not happy with this answer. However, after lengthy discussion she was willing to accept that she needs to try to exercise more.  Plan: -Stop inhaled therapies, they're not providing any benefit -Exercise was encouraged  Cough Today she had a lot of complaints of her cough. Specifically she said that she coughs a lot in the evenings when she goes to bed. She often produces clear mucous.  Based on her known diagnosis of achalasia and the fact that she often eats and drinks not long before bedtime I explained to her that I think the most likely etiology of her cough is reflux.  Plan: -Advised to not eat within 3 hours ago into bed -Continue followup with GI medicine    Updated Medication List Outpatient Encounter Prescriptions as of 07/30/2013  Medication Sig  . albuterol (PROVENTIL HFA;VENTOLIN HFA) 108 (90 BASE) MCG/ACT inhaler Inhale 2 puffs into the lungs every 6 (six) hours as needed for wheezing or  shortness of breath.  Marland Kitchen amLODipine (NORVASC) 5 MG tablet Take 5 mg by mouth daily.  . ARIPiprazole (ABILIFY) 5 MG tablet Take 2.5 mg by mouth daily.  Marland Kitchen aspirin EC 81 MG tablet Take 81 mg by mouth daily.  . benzonatate (TESSALON) 100 MG capsule Take 1 capsule (100 mg total) by mouth 3 (three) times daily as needed for cough.  Marland Kitchen buPROPion (WELLBUTRIN XL) 150 MG 24 hr tablet Take 450 mg by mouth daily.  . Calcium Carbonate-Vitamin D (CALCIUM + D PO) Take by mouth.    . Canagliflozin-Metformin HCl (INVOKAMET) [phone] MG TABS Take 1 tablet by mouth daily.  . Cholecalciferol (VITAMIN D) 1000  UNITS capsule Take 1,000 Units by mouth daily.  . ClonazePAM (KLONOPIN PO) Take 0.5 mg by mouth 2 (two) times daily. For anxiety  . DULoxetine (CYMBALTA) 60 MG capsule Take 60 mg by mouth 2 (two) times daily.   Marland Kitchen gabapentin (NEURONTIN) 300 MG capsule Take 300 mg by mouth daily.   Marland Kitchen HYDROcodone-acetaminophen (NORCO/VICODIN) 5-325 MG per tablet Take 1-2 tablets by mouth every 6 (six) hours as needed for moderate pain.  Marland Kitchen loratadine-pseudoephedrine (CLARITIN-D 12-HOUR) 5-120 MG per tablet Take 1 tablet by mouth as needed.   . pantoprazole (PROTONIX) 40 MG tablet Take 40 mg by mouth 2 (two) times daily.   Marland Kitchen ROPINIRole HCl (REQUIP PO) Take 2 mg by mouth 2 (two) times daily.   Marland Kitchen Spacer/Aero-Holding Chambers (AEROCHAMBER MINI CHAMBER) DEVI Use as directed  . topiramate (TOPAMAX) 25 MG tablet Take 25 mg by mouth 2 (two) times daily.  . valACYclovir (VALTREX) 1000 MG tablet Take 1,000 mg by mouth daily.  . valsartan-hydrochlorothiazide (DIOVAN-HCT) 160-12.5 MG per tablet Take 1 tablet by mouth daily. 1/2 tab daily on mon, wed, fri - 0 other days  . zolpidem (AMBIEN) 5 MG tablet Take 5 mg by mouth at bedtime as needed.  . [DISCONTINUED] ALPRAZolam (XANAX) 1 MG tablet Take 1 mg by mouth daily.

## 2013-07-30 NOTE — Patient Instructions (Signed)
Do not eat within three hours of bedtime Use hard candies or warm beverages to soothe your throat on days that your cough is worse Try to exercise regularly We will see you back in 4 months or sooner if needed

## 2013-07-31 DIAGNOSIS — R05 Cough: Secondary | ICD-10-CM | POA: Insufficient documentation

## 2013-07-31 DIAGNOSIS — R0602 Shortness of breath: Secondary | ICD-10-CM | POA: Diagnosis not present

## 2013-07-31 DIAGNOSIS — R059 Cough, unspecified: Secondary | ICD-10-CM | POA: Insufficient documentation

## 2013-07-31 NOTE — Assessment & Plan Note (Signed)
Her pulmonary function testing really did not show much in the way of airflow obstruction. Further, during her cardiopulmonary exercise test you know she was somewhat limited in her exercise capacity her ventilatory reserve was well preserved. So explained to her today that there is really no clear underlying lung disease at all. Further, given her recent cardiac workup which was normal I do not think that she has evidence of cardiac disease either.  I explained to her that I think the most likely etiology for her shortness of breath is deconditioning. She is not happy with this answer. However, after lengthy discussion she was willing to accept that she needs to try to exercise more.  Plan: -Stop inhaled therapies, they're not providing any benefit -Exercise was encouraged

## 2013-07-31 NOTE — Assessment & Plan Note (Signed)
Today she had a lot of complaints of her cough. Specifically she said that she coughs a lot in the evenings when she goes to bed. She often produces clear mucous.  Based on her known diagnosis of achalasia and the fact that she often eats and drinks not long before bedtime I explained to her that I think the most likely etiology of her cough is reflux.  Plan: -Advised to not eat within 3 hours ago into bed -Continue followup with GI medicine

## 2013-08-07 ENCOUNTER — Ambulatory Visit (INDEPENDENT_AMBULATORY_CARE_PROVIDER_SITE_OTHER): Payer: Self-pay | Admitting: Family Medicine

## 2013-08-07 VITALS — BP 142/82 | HR 89 | Ht 66.0 in | Wt 208.8 lb

## 2013-08-07 DIAGNOSIS — E119 Type 2 diabetes mellitus without complications: Secondary | ICD-10-CM

## 2013-08-07 NOTE — Progress Notes (Signed)
Patient presents today for DM follow-up as part of employer-sponsored Link to [employer]. Medications, glucose readings, a1c and compliance have been reviewed. I have also discussed with patient lifestyle interventions including diet and exercise. Details of the visit can be found in [employer] documenting program through [facility]). Patient has set a series of personal goals and will follow-up in no more than 3 months for further review of DM. A1c 08/07/14: 7.2%

## 2013-08-08 ENCOUNTER — Ambulatory Visit (INDEPENDENT_AMBULATORY_CARE_PROVIDER_SITE_OTHER): Payer: Medicare Other

## 2013-08-08 ENCOUNTER — Ambulatory Visit (INDEPENDENT_AMBULATORY_CARE_PROVIDER_SITE_OTHER): Payer: 59 | Admitting: Podiatry

## 2013-08-08 ENCOUNTER — Encounter: Payer: Self-pay | Admitting: Podiatry

## 2013-08-08 ENCOUNTER — Telehealth: Payer: Self-pay | Admitting: *Deleted

## 2013-08-08 VITALS — BP 134/80 | HR 96 | Resp 12

## 2013-08-08 DIAGNOSIS — R52 Pain, unspecified: Secondary | ICD-10-CM | POA: Diagnosis not present

## 2013-08-08 DIAGNOSIS — M722 Plantar fascial fibromatosis: Secondary | ICD-10-CM

## 2013-08-08 MED ORDER — MELOXICAM 15 MG PO TABS
15.0000 mg | ORAL_TABLET | Freq: Every day | ORAL | Status: DC
Start: 2013-08-08 — End: 2013-09-26

## 2013-08-08 MED ORDER — METHYLPREDNISOLONE (PAK) 4 MG PO TABS: ORAL_TABLET | ORAL | Status: DC

## 2013-08-08 NOTE — Progress Notes (Signed)
   Subjective:    Patient ID: Deanna Rowe, female    DOB: 10/31/1955, 58 y.o.   MRN: [id number]  HPI PT STATED LT FOOT BOTTOM OF THE HEEL IS BEEN PAINFUL FOR 7 WEEKS. THE FOOT IS GETTING WORSE AND IT AGGRAVATED BY PRESSURE AND WALKING. TRIED HEEL LIFT BUT DID NOT HELP.    Review of Systems  Constitutional: Positive for chills and appetite change.  HENT: Positive for hearing loss.   Respiratory: Positive for cough, chest tightness and wheezing.   Cardiovascular: Positive for palpitations.  Musculoskeletal: Positive for gait problem and joint swelling.  Neurological: Positive for dizziness.       Objective:   Physical Exam: I have reviewed her past medical history medications allergies surgeries social history and review of systems. Pulses are strongly palpable bilateral. Capillary fill time to digits one through 5 of the bilateral foot is immediate. Neurologic sensorium is intact per since once the monofilament. Deep tendon reflexes are intact bilateral. Muscle strength is 5 over 5 dorsiflexors plantar flexors inverters everters all intrinsic musculature is intact. Orthopedic evaluation demonstrates pain on palpation medial continued tubercle of the left heel. Radiographic evaluation confirms soft tissue increase in density at the plantar fascial calcaneal insertion site. Cutaneous evaluation demonstrates supple well hydrated cutis no erythema edema saline is drainage or odor.        Assessment & Plan:  Assessment: Plantar fasciitis left foot.  Plan: Medrol Dosepak. Injected left heel today with Kenalog and local anesthetic. Put her in a night splint and plantar fascial brace. We discussed appropriate shoe gear stretching sizes ice therapy shoe gear modifications and will followup with her in one month.

## 2013-08-08 NOTE — Telephone Encounter (Signed)
He prescribed Mobic and patient said she is allergic to NSAIDS.  She said she cannot take those.  She has nausea and vomiting per our records.  She wants to know if you can prescribe something else.

## 2013-08-09 NOTE — Telephone Encounter (Signed)
Our allergy list does not state that she has problems with NSAIDS.  I asked her if she could take them and she replied yes giving examples of advil and aleve.  None the less the should not take this if she is "allergic".  There is nothing else of its kind.  We will not give narcotic for this problem.  I will follow up with her at her scheduled appointment time.

## 2013-08-09 NOTE — Telephone Encounter (Signed)
I called and informed Deanna Rowe that Dr. Milinda Pointer said there's nothing else he can prescribe since she's allergic to NSAIDS.  He does not prescribe narcotics for this problem.  She stated okay she'll let the patient know.  She said patient seems to be okay with the dospack.

## 2013-08-15 ENCOUNTER — Telehealth: Payer: Self-pay | Admitting: *Deleted

## 2013-08-15 NOTE — Telephone Encounter (Addendum)
Pt states she is in extreme pain with her heel, has been icing and doing everything Dr. Milinda Pointer ordered.  Is there anything else she can do?  Dr Milinda Pointer states have pt in to be seen by doctor on Friday.  I informed pt and she agreed.

## 2013-08-16 ENCOUNTER — Ambulatory Visit (INDEPENDENT_AMBULATORY_CARE_PROVIDER_SITE_OTHER): Payer: 59

## 2013-08-16 VITALS — BP 124/80 | HR 89 | Resp 16

## 2013-08-16 DIAGNOSIS — M722 Plantar fascial fibromatosis: Secondary | ICD-10-CM

## 2013-08-16 DIAGNOSIS — M84376A Stress fracture, unspecified foot, initial encounter for fracture: Secondary | ICD-10-CM

## 2013-08-16 DIAGNOSIS — M8430XA Stress fracture, unspecified site, initial encounter for fracture: Secondary | ICD-10-CM

## 2013-08-16 DIAGNOSIS — R52 Pain, unspecified: Secondary | ICD-10-CM

## 2013-08-16 DIAGNOSIS — R609 Edema, unspecified: Secondary | ICD-10-CM | POA: Diagnosis not present

## 2013-08-16 DIAGNOSIS — IMO0002 Reserved for concepts with insufficient information to code with codable children: Secondary | ICD-10-CM

## 2013-08-16 DIAGNOSIS — M792 Neuralgia and neuritis, unspecified: Secondary | ICD-10-CM

## 2013-08-16 NOTE — Patient Instructions (Signed)

## 2013-08-16 NOTE — Progress Notes (Signed)
   Subjective:    Patient ID: Deanna Rowe, female    DOB: July 27, 1955, 58 y.o.   MRN: [id number]  HPI Comments: The left heel is hurting      Review of Systems no new systemic changes or findings posse some swelling of the left heel noted no history of trauma    Objective:   Physical Exam Patient presents this time for followup of heel pain was seen a week ago when Dr. Milinda Pointer was given a steroid injection into the painful tender symptomatic for about 3 weeks now continues to be painful sharp shooting aching pain posse some paresthesias noted ice hasn't helped using taking oxycodone hydrocodone has not helped. Cannot take NSAIDs due to GI history should note neurovascular status is intact mild tenderness on direct lateral palpation of the heel and calcaneal area and inferior calcaneal tubercle region no new x-rays taken at this time pain in a weightbearing standing of the foot or compression of the heel. Also tender on dorsiflexion and palpation of plantar fascial structures. Neurovascular status intact and unchanged pedal pulses are palpable no increased temperature no signs of infection no ascending psoas lymphangitis noted       Assessment & Plan:  Assessment this time cannot rule out persistent recalcitrant plantar fasciitis/heel spur syndrome also cannot rule out possible stress fracture the calcaneus as there is definitely some soft tissue swelling and pain tenderness both on direct inferior and lateral compression medial suggest followup in the next weeks with Dr. Milinda Pointer at this time patient placed in the air fracture walker immobilizer with compression and a pneumatic pump help reduce edema and immobilizer foot patient will no longer doing stretching exercise or activities maintain ice and Tylenol as needed for pain patient noted immediate improvement with ambulation wearing the boot versus an or shoe maintain air fracture boot for at least 2-4 weeks as instructed followup with Dr. Milinda Pointer  suggest a followup x-ray to rule out a stress fracture next  Harriet Masson DPM

## 2013-08-20 DIAGNOSIS — N3941 Urge incontinence: Secondary | ICD-10-CM | POA: Diagnosis not present

## 2013-08-20 DIAGNOSIS — M6281 Muscle weakness (generalized): Secondary | ICD-10-CM | POA: Diagnosis not present

## 2013-08-20 DIAGNOSIS — R3915 Urgency of urination: Secondary | ICD-10-CM | POA: Diagnosis not present

## 2013-09-04 ENCOUNTER — Encounter: Payer: Self-pay | Admitting: *Deleted

## 2013-09-04 ENCOUNTER — Encounter: Payer: Self-pay | Admitting: Podiatry

## 2013-09-06 ENCOUNTER — Telehealth: Payer: Self-pay

## 2013-09-06 NOTE — Telephone Encounter (Signed)
Called pt back to inquire details about pain in heel, no answer and unable to leave vm

## 2013-09-10 ENCOUNTER — Telehealth: Payer: Self-pay | Admitting: *Deleted

## 2013-09-10 MED ORDER — HYDROCODONE-ACETAMINOPHEN 5-325 MG PO TABS: 1.0000 | ORAL_TABLET | Freq: Four times a day (QID) | ORAL | Status: DC | PRN

## 2013-09-10 NOTE — Telephone Encounter (Signed)
I left the patient a message that Dr. Marta Antu the prescription for the Vicodin 5mg /325mg .  He said this is a one time only prescription because he does not normally prescribe narcotics to treat heel pain.  I informed her she will have to come by the office to pick it up because narcotics can not be sent to the pharmacy.  Identification will be required to pick up the prescription.

## 2013-09-12 ENCOUNTER — Encounter: Payer: Self-pay | Admitting: Podiatry

## 2013-09-12 ENCOUNTER — Ambulatory Visit (INDEPENDENT_AMBULATORY_CARE_PROVIDER_SITE_OTHER): Payer: 59

## 2013-09-12 ENCOUNTER — Ambulatory Visit (INDEPENDENT_AMBULATORY_CARE_PROVIDER_SITE_OTHER): Payer: Medicare Other | Admitting: Podiatry

## 2013-09-12 DIAGNOSIS — M8430XA Stress fracture, unspecified site, initial encounter for fracture: Secondary | ICD-10-CM

## 2013-09-12 DIAGNOSIS — M722 Plantar fascial fibromatosis: Secondary | ICD-10-CM | POA: Diagnosis not present

## 2013-09-13 NOTE — Progress Notes (Signed)
She presents for followup today of her left heel pain. She was seen by Dr.Sikora in a put her in a Cam Walker for a plantar fascial sprain of her left foot. She states that her left foot hurts so bad and she currently presents without the Cam Walker today.  Objective: Pulses are strongly palpable left foot. She has pain on palpation medial calcaneal tubercle area that I am unable to palpate the medial band of the plantar fascia in the arch. When I evaluated it with the contralateral foot it was evident that there must have been a rupture of the plantar fascia. Radiographic evaluation confirms this and disproves a calcaneal fracture.  Assessment: Rupture of the plantar fascia at its insertion site of the left heel.  Plan: Continue the use of the Cam Walker for at least another 4-6 weeks

## 2013-09-18 NOTE — Progress Notes (Signed)
Arlee Muslim, PA  -  Please enter preop orders in Epic for Deanna Rowe - her surgery date is 7/8  And she is coming to [facility] on 7/1 for her preop.  Thanks.

## 2013-09-19 ENCOUNTER — Ambulatory Visit: Payer: Medicare Other | Admitting: Podiatry

## 2013-09-26 ENCOUNTER — Encounter (HOSPITAL_COMMUNITY): Payer: Self-pay | Admitting: Pharmacy Technician

## 2013-09-26 ENCOUNTER — Other Ambulatory Visit: Payer: Self-pay | Admitting: Orthopedic Surgery

## 2013-09-26 ENCOUNTER — Telehealth: Payer: Self-pay | Admitting: *Deleted

## 2013-09-26 NOTE — Telephone Encounter (Signed)
I returned her call.  I informed her that Dr. Milinda Pointer said he doesn't need to see her until after she has recovered from the surgery.  She said okay tell him thank you.

## 2013-09-26 NOTE — Telephone Encounter (Signed)
I do not need to see her until she has recovered from her hip surgery.

## 2013-09-26 NOTE — Telephone Encounter (Signed)
I'm scheduled to have a total hip replacement, left, on 10/09/2013.  Does he need to see me before I have the surgery?  My appointment with him is scheduled after I have surgery.  My heel feels much better.  Is it okay for me to have the surgery?

## 2013-10-01 NOTE — Patient Instructions (Addendum)
Deanna Rowe  10/01/2013                           YOUR PROCEDURE IS SCHEDULED ON: 10/09/13 at 8:30 am               [place]  SIGNS TO SHORT STAY CENTER                 ARRIVE AT SHORT STAY AT: 6:30 am               CALL THIS NUMBER IF ANY PROBLEMS THE DAY OF SURGERY :               [phone]                                REMEMBER:   Do not eat food or drink liquids AFTER MIDNIGHT                 Take these medicines the morning of surgery with               A SIPS OF WATER :  AMLODIPINE / WELLBUTRIN / CLONAZEPAM / CYMBALTA / PROTONIX / REQUIP / TOPAMAX / VALTREX / MAY TAKE VICODIN IF NEEDED       Do not wear jewelry, make-up   Do not wear lotions, powders, or perfumes.   Do not shave legs or underarms 12 hrs. before surgery (men may shave face)  Do not bring valuables to the hospital.  Contacts, dentures or bridgework may not be worn into surgery.  Leave suitcase in the car. After surgery it may be brought to your room.  For patients admitted to the hospital more than one night, checkout time is            11:00 AM                                                        ________________________________________________________________________                                                                        [place]  Before surgery, you can play an important role.  Because skin is not sterile, your skin needs to be as free of germs as possible.  You can reduce the number of germs on your skin by washing with CHG (chlorahexidine gluconate) soap before surgery.  CHG is an antiseptic cleaner which kills germs and bonds with the skin to continue killing germs even after washing. Please DO NOT use if you have an allergy to CHG or antibacterial soaps.  If your skin becomes reddened/irritated stop using the CHG and inform your nurse when you arrive at Short  Stay. Do  not shave (including legs and underarms) for at least 48 hours prior to the first CHG shower.  You may shave your face. Please follow these instructions carefully:   1.  Shower with CHG Soap the night before surgery and the  morning of Surgery.   2.  If you choose to wash your hair, wash your hair first as usual with your  normal  Shampoo.   3.  After you shampoo, rinse your hair and body thoroughly to remove the  shampoo.                                         4.  Use CHG as you would any other liquid soap.  You can apply chg directly  to the skin and wash . Gently wash with scrungie or clean wascloth    5.  Apply the CHG Soap to your body ONLY FROM THE NECK DOWN.   Do not use on open                           Wound or open sores. Avoid contact with eyes, ears mouth and genitals (private parts).                        Genitals (private parts) with your normal soap.              6.  Wash thoroughly, paying special attention to the area where your surgery  will be performed.   7.  Thoroughly rinse your body with warm water from the neck down.   8.  DO NOT shower/wash with your normal soap after using and rinsing off  the CHG Soap .                9.  Pat yourself dry with a clean towel.             10.  Wear clean pajamas.             11.  Place clean sheets on your bed the night of your first shower and do not  sleep with pets.  Day of Surgery : Do not apply any lotions/deodorants the morning of surgery.  Please wear clean clothes to the hospital/surgery center.  FAILURE TO FOLLOW THESE INSTRUCTIONS MAY RESULT IN THE CANCELLATION OF YOUR SURGERY    PATIENT SIGNATURE_________________________________  ______________________________________________________________________     [name]  An incentive spirometer is a tool that can help keep your lungs clear and active. This tool measures how well you are filling your lungs with each breath. Taking long  deep breaths may help reverse or decrease the chance of developing breathing (pulmonary) problems (especially infection) following:  A long period of time when you are unable to move or be active. BEFORE THE PROCEDURE   If the spirometer includes an indicator to show your best effort, your nurse or respiratory therapist will set it to a desired goal.  If possible, sit up straight or lean slightly forward. Try not to slouch.  Hold the incentive spirometer in an upright position. INSTRUCTIONS FOR USE  1. Sit on the edge of your bed if possible, or sit up as far as you can in bed or on a chair. 2. Hold the incentive spirometer in an upright position. 3. Breathe out normally. 4. Place  the mouthpiece in your mouth and seal your lips tightly around it. 5. Breathe in slowly and as deeply as possible, raising the piston or the ball toward the top of the column. 6. Hold your breath for 3-5 seconds or for as long as possible. Allow the piston or ball to fall to the bottom of the column. 7. Remove the mouthpiece from your mouth and breathe out normally. 8. Rest for a few seconds and repeat Steps 1 through 7 at least 10 times every 1-2 hours when you are awake. Take your time and take a few normal breaths between deep breaths. 9. The spirometer may include an indicator to show your best effort. Use the indicator as a goal to work toward during each repetition. 10. After each set of 10 deep breaths, practice coughing to be sure your lungs are clear. If you have an incision (the cut made at the time of surgery), support your incision when coughing by placing a pillow or rolled up towels firmly against it. Once you are able to get out of bed, walk around indoors and cough well. You may stop using the incentive spirometer when instructed by your caregiver.  RISKS AND COMPLICATIONS  Take your time so you do not get dizzy or light-headed.  If you are in pain, you may need to take or ask for pain medication  before doing incentive spirometry. It is harder to take a deep breath if you are having pain. AFTER USE  Rest and breathe slowly and easily.  It can be helpful to keep track of a log of your progress. Your caregiver can provide you with a simple table to help with this. If you are using the spirometer at home, follow these instructions: [place] IF:   You are having difficultly using the spirometer.  You have trouble using the spirometer as often as instructed.  Your pain medication is not giving enough relief while using the spirometer.  You develop fever of 100.5 F (38.1 C) or higher. SEEK IMMEDIATE MEDICAL CARE IF:   You cough up bloody sputum that had not been present before.  You develop fever of 102 F (38.9 C) or greater.  You develop worsening pain at or near the incision site. MAKE SURE YOU:   Understand these instructions.  Will watch your condition.  Will get help right away if you are not doing well or get worse. Document Released: 08/01/2006 Document Revised: 06/13/2011 Document Reviewed: 10/02/2006 [employer] Patient Information 2014 [employer], [place].   ________________________________________________________________________  WHAT IS A BLOOD TRANSFUSION? Blood Transfusion Information  A transfusion is the replacement of blood or some of its parts. Blood is made up of multiple cells which provide different functions.  Red blood cells carry oxygen and are used for blood loss replacement.  White blood cells fight against infection.  Platelets control bleeding.  Plasma helps clot blood.  Other blood products are available for specialized needs, such as hemophilia or other clotting disorders. BEFORE THE TRANSFUSION  Who gives blood for transfusions?   Healthy volunteers who are fully evaluated to make sure their blood is safe. This is blood bank blood. Transfusion therapy is the safest it has ever been in the practice of medicine. Before blood is  taken from a donor, a complete history is taken to make sure that person has no history of diseases nor engages in risky social behavior (examples are intravenous drug use or sexual activity with multiple partners). The donor's travel history is screened  to minimize risk of transmitting infections, such as malaria. The donated blood is tested for signs of infectious diseases, such as HIV and hepatitis. The blood is then tested to be sure it is compatible with you in order to minimize the chance of a transfusion reaction. If you or a relative donates blood, this is often done in anticipation of surgery and is not appropriate for emergency situations. It takes many days to process the donated blood. RISKS AND COMPLICATIONS Although transfusion therapy is very safe and saves many lives, the main dangers of transfusion include:   Getting an infectious disease.  Developing a transfusion reaction. This is an allergic reaction to something in the blood you were given. Every precaution is taken to prevent this. The decision to have a blood transfusion has been considered carefully by your caregiver before blood is given. Blood is not given unless the benefits outweigh the risks. AFTER THE TRANSFUSION  Right after receiving a blood transfusion, you will usually feel much better and more energetic. This is especially true if your red blood cells have gotten low (anemic). The transfusion raises the level of the red blood cells which carry oxygen, and this usually causes an energy increase.  The nurse administering the transfusion will monitor you carefully for complications. HOME CARE INSTRUCTIONS  No special instructions are needed after a transfusion. You may find your energy is better. Speak with your caregiver about any limitations on activity for underlying diseases you may have. SEEK MEDICAL CARE IF:   Your condition is not improving after your transfusion.  You develop redness or irritation at the  intravenous (IV) site. SEEK IMMEDIATE MEDICAL CARE IF:  Any of the following symptoms occur over the next 12 hours:  Shaking chills.  You have a temperature by mouth above 102 F (38.9 C), not controlled by medicine.  Chest, back, or muscle pain.  People around you feel you are not acting correctly or are confused.  Shortness of breath or difficulty breathing.  Dizziness and fainting.  You get a rash or develop hives.  You have a decrease in urine output.  Your urine turns a dark color or changes to pink, red, or brown. Any of the following symptoms occur over the next 10 days:  You have a temperature by mouth above 102 F (38.9 C), not controlled by medicine.  Shortness of breath.  Weakness after normal activity.  The white part of the eye turns yellow (jaundice).  You have a decrease in the amount of urine or are urinating less often.  Your urine turns a dark color or changes to pink, red, or brown. Document Released: 03/18/2000 Document Revised: 06/13/2011 Document Reviewed: 11/05/2007 [facility] Patient Information 2014 [place], [place].  _______________________________________________________________________

## 2013-10-02 ENCOUNTER — Encounter (HOSPITAL_COMMUNITY)
Admission: RE | Admit: 2013-10-02 | Discharge: 2013-10-02 | Disposition: A | Discharge status: Home / Self Care | Payer: 59 | Source: Ambulatory Visit | Attending: Orthopedic Surgery | Admitting: Orthopedic Surgery

## 2013-10-02 ENCOUNTER — Encounter: Payer: Self-pay | Admitting: Podiatry

## 2013-10-02 ENCOUNTER — Ambulatory Visit (HOSPITAL_COMMUNITY)
Admission: RE | Admit: 2013-10-02 | Discharge: 2013-10-02 | Disposition: A | Discharge status: Home / Self Care | Payer: 59 | Source: Ambulatory Visit | Attending: Orthopedic Surgery | Admitting: Orthopedic Surgery

## 2013-10-02 ENCOUNTER — Encounter (HOSPITAL_COMMUNITY): Payer: Self-pay

## 2013-10-02 DIAGNOSIS — M25559 Pain in unspecified hip: Secondary | ICD-10-CM | POA: Insufficient documentation

## 2013-10-02 DIAGNOSIS — Z01818 Encounter for other preprocedural examination: Secondary | ICD-10-CM | POA: Insufficient documentation

## 2013-10-02 DIAGNOSIS — Z01812 Encounter for preprocedural laboratory examination: Secondary | ICD-10-CM | POA: Insufficient documentation

## 2013-10-02 HISTORY — DX: Cough: R05

## 2013-10-02 HISTORY — DX: Nausea with vomiting, unspecified: R11.2

## 2013-10-02 HISTORY — DX: Other specified postprocedural states: Z98.890

## 2013-10-02 HISTORY — DX: Transient cerebral ischemic attack, unspecified: G45.9

## 2013-10-02 HISTORY — DX: Gastro-esophageal reflux disease without esophagitis: K21.9

## 2013-10-02 HISTORY — DX: Unspecified osteoarthritis, unspecified site: M19.90

## 2013-10-02 HISTORY — DX: Idiopathic aseptic necrosis of unspecified femur: M87.059

## 2013-10-02 HISTORY — DX: Urge incontinence: N39.41

## 2013-10-02 HISTORY — DX: Other specified cough: R05.8

## 2013-10-02 HISTORY — DX: Insomnia, unspecified: G47.00

## 2013-10-02 LAB — COMPREHENSIVE METABOLIC PANEL
ALT: 15 U/L (ref 0–35)
AST: 18 U/L (ref 0–37)
Albumin: 3.7 g/dL (ref 3.5–5.2)
Alkaline Phosphatase: 47 U/L (ref 39–117)
Anion gap: 11 (ref 5–15)
BUN: 16 mg/dL (ref 6–23)
CO2: 26 mEq/L (ref 19–32)
Calcium: 9.6 mg/dL (ref 8.4–10.5)
Chloride: 104 mEq/L (ref 96–112)
Creatinine, Ser: 1.22 mg/dL — ABNORMAL HIGH (ref 0.50–1.10)
GFR calc Af Amer: 55 mL/min — ABNORMAL LOW (ref >90)
GFR calc non Af Amer: 48 mL/min — ABNORMAL LOW (ref >90)
Glucose, Bld: 97 mg/dL (ref 70–99)
Potassium: 4.6 mEq/L (ref 3.7–5.3)
Sodium: 141 mEq/L (ref 137–147)
Total Bilirubin: 0.3 mg/dL (ref 0.3–1.2)
Total Protein: 7.7 g/dL (ref 6.0–8.3)

## 2013-10-02 LAB — CBC
HCT: 38.9 % (ref 36.0–46.0)
Hemoglobin: 12.8 g/dL (ref 12.0–15.0)
MCH: 32.7 pg (ref 26.0–34.0)
MCHC: 32.9 g/dL (ref 30.0–36.0)
MCV: 99.5 fL (ref 78.0–100.0)
Platelets: 267 10*3/uL (ref 150–400)
RBC: 3.91 MIL/uL (ref 3.87–5.11)
RDW: 13.9 % (ref 11.5–15.5)
WBC: 6.1 10*3/uL (ref 4.0–10.5)

## 2013-10-02 LAB — SURGICAL PCR SCREEN
MRSA, PCR: NEGATIVE
Staphylococcus aureus: NEGATIVE

## 2013-10-02 LAB — URINALYSIS, ROUTINE W REFLEX MICROSCOPIC
Bilirubin Urine: NEGATIVE
Glucose, UA: >1000 mg/dL — AB
Hgb urine dipstick: NEGATIVE
Ketones, ur: NEGATIVE mg/dL
Leukocytes, UA: NEGATIVE
Nitrite: NEGATIVE
Protein, ur: NEGATIVE mg/dL
Specific Gravity, Urine: 1.023 (ref 1.005–1.030)
Urobilinogen, UA: 0.2 mg/dL (ref 0.0–1.0)
pH: 6.5 (ref 5.0–8.0)

## 2013-10-02 LAB — APTT: aPTT: 28 s (ref 24–37)

## 2013-10-02 LAB — URINE MICROSCOPIC-ADD ON

## 2013-10-02 LAB — PROTIME-INR
INR: 1.03 (ref 0.00–1.49)
Prothrombin Time: 13.5 s (ref 11.6–15.2)

## 2013-10-02 NOTE — Progress Notes (Signed)
Abnormal UA faxed to Dr.Aluisio 

## 2013-10-02 NOTE — Progress Notes (Signed)
10/02/13 0830  OBSTRUCTIVE SLEEP APNEA  Have you ever been diagnosed with sleep apnea through a sleep study? No  Do you snore loudly (loud enough to be heard through closed doors)?  1  Do you often feel tired, fatigued, or sleepy during the daytime? 0  Has anyone observed you stop breathing during your sleep? 0  Do you have, or are you being treated for high blood pressure? 1  BMI more than 35 kg/m2? 0  Age over 59 years old? 1  Neck circumference greater than 40 cm/16 inches? 1  Gender: 0  Obstructive Sleep Apnea Score 4  Score 4 or greater  Results sent to PCP

## 2013-10-03 ENCOUNTER — Other Ambulatory Visit: Payer: Self-pay | Admitting: Surgical

## 2013-10-03 MED ORDER — TRANEXAMIC ACID 100 MG/ML IV SOLN: 2000.0000 mg | Freq: Once | INTRAVENOUS | Status: AC

## 2013-10-03 NOTE — H&P (Signed)
TOTAL KNEE ADMISSION H&P  Patient is being admitted for left total hip arthroplasty.  Subjective:  Chief Complaint:left hip pain.  HPI: Deanna Rowe, 58 y.o. female, has a history of pain and functional disability in the left hip due to arthritis and AVN and has failed non-surgical conservative treatments for greater than 12 weeks to includeNSAID's and/or analgesics, corticosteriod injections, use of assistive devices, weight reduction as appropriate and activity modification.  Onset of symptoms was gradual, starting 5 years ago with gradually worsening course since that time. The patient noted no past surgery on the left hip(s).  Patient currently rates pain in the left hip(s) at 7 out of 10 with activity. Patient has night pain, worsening of pain with activity and weight bearing, pain that interferes with activities of daily living and pain with passive range of motion.  Patient has evidence of periarticular osteophytes, joint space narrowing and AVN by imaging studies. There is no active infection.  Patient Active Problem List   Diagnosis Date Noted  . Cough 07/31/2013  . Shortness of breath 06/18/2013  . Anxiety   . Diabetes mellitus, type II   . Esophageal dysphagia 09/12/2012  . Depression 02/21/2012  . Stroke 02/11/2012   Past Medical History  Diagnosis Date  . Diabetes mellitus   . Hypertension   . Obesity   . Anxiety   . Depression   . Diabetes mellitus, type II   . PONV (postoperative nausea and vomiting)   . Dry cough   . TIA (transient ischemic attack) 2012    no residual problems  . Arthritis     L HIP  . Avascular necrosis of hip     LEFT  . GERD (gastroesophageal reflux disease)   . Urgency incontinence   . Insomnia     takes Ambien nightly    Past Surgical History  Procedure Laterality Date  . Dilation and curettage of uterus    . Esophageal manometry N/A 09/03/2012    Procedure: ESOPHAGEAL MANOMETRY (EM);  Surgeon: Garlan Fair, MD;  Location: [facility]  ENDOSCOPY;  Service: Endoscopy;  Laterality: N/A;  . Joint replacement  2011    rt total hip  . Exploratory laparotomy    . Pilonidal cyst / sinus excision       Current outpatient prescriptions: amLODipine (NORVASC) 5 MG tablet, Take 5 mg by mouth every morning. , Disp: , Rfl: ;   ARIPiprazole (ABILIFY) 5 MG tablet, Take 2.5 mg by mouth every evening. , Disp: , Rfl: ;   aspirin EC 81 MG tablet, Take 81 mg by mouth every evening. , Disp: , Rfl: ;   buPROPion (WELLBUTRIN XL) 150 MG 24 hr tablet, Take 450 mg by mouth every morning. , Disp: , Rfl:  Calcium Carbonate-Vitamin D (CALCIUM + D PO), Take 1 tablet by mouth daily. , Disp: , Rfl: ;   Canagliflozin-Metformin HCl (INVOKAMET) [phone] MG TABS, Take 1 tablet by mouth every morning. , Disp: , Rfl: ;    Cholecalciferol (VITAMIN D) 1000 UNITS capsule, Take 1,000 Units by mouth daily., Disp: , Rfl: ;   ClonazePAM (KLONOPIN PO), Take 0.5 mg by mouth 2 (two) times daily. For anxiety, Disp: , Rfl:  dextromethorphan-guaiFENesin (MUCINEX DM) 30-600 MG per 12 hr tablet, Take 1 tablet by mouth 2 (two) times daily as needed for cough., Disp: , Rfl: ;   DULoxetine (CYMBALTA) 60 MG capsule, Take 60 mg by mouth 2 (two) times daily. , Disp: , Rfl: ;   gabapentin (NEURONTIN)  300 MG capsule, Take 300 mg by mouth at bedtime. , Disp: , Rfl:  HYDROcodone-acetaminophen (NORCO/VICODIN) 5-325 MG per tablet, Take 1-2 tablets by mouth every 6 (six) hours as needed for moderate pain., Disp: 60 tablet, Rfl: 0;  loratadine-pseudoephedrine (CLARITIN-D 12-HOUR) 5-120 MG per tablet, Take 1 tablet by mouth daily as needed for allergies. , Disp: , Rfl: ;   pantoprazole (PROTONIX) 40 MG tablet, Take 40 mg by mouth 2 (two) times daily. , Disp: , Rfl:  predniSONE (STERAPRED UNI-PAK) 5 MG TABS tablet, Take by mouth., Disp: , Rfl: ;   ROPINIRole HCl (REQUIP PO), Take 2 mg by mouth 2 (two) times daily. , Disp: , Rfl: ;   Spacer/Aero-Holding Chambers (AEROCHAMBER MINI CHAMBER) DEVI,  Use as directed, Disp: 1 Device, Rfl: 0;   topiramate (TOPAMAX) 25 MG tablet, Take 25 mg by mouth 2 (two) times daily., Disp: , Rfl: ;   valACYclovir (VALTREX) 1000 MG tablet, Take 1,000 mg by mouth daily., Disp: , Rfl:  valsartan-hydrochlorothiazide (DIOVAN-HCT) 160-12.5 MG per tablet, Take 0.5 tablets by mouth every Monday, Wednesday, and Friday at 6 PM. 1/2 tab daily on mon, wed, fri - 0 tabs other days, Disp: , Rfl: ;  zolpidem (AMBIEN) 5 MG tablet, Take 5 mg by mouth at bedtime. , Disp: , Rfl:   Allergies  Allergen Reactions  . Codeine Nausea And Vomiting  . Amoxicillin Rash    History  Substance Use Topics  . Smoking status: Never Smoker   . Smokeless tobacco: No  . Alcohol Use: No      Review of Systems  Constitutional: Positive for diaphoresis. Negative for fever, chills, weight loss and malaise/fatigue.  HENT: Positive for hearing loss and tinnitus. Negative for congestion, ear discharge, ear pain, nosebleeds and sore throat.   Eyes: Negative.   Respiratory: Positive for cough and shortness of breath. Negative for hemoptysis, sputum production, wheezing and stridor.        SOB with exertion  Cardiovascular: Negative.   Gastrointestinal: Negative.   Genitourinary: Positive for frequency. Negative for dysuria, urgency, hematuria and flank pain.  Musculoskeletal: Positive for joint pain. Negative for back pain, falls, myalgias and neck pain.       Left hip pain  Skin: Negative.   Neurological: Positive for dizziness. Negative for tingling, tremors, sensory change, speech change, focal weakness, seizures, loss of consciousness, weakness and headaches.  Endo/Heme/Allergies: Negative.   Psychiatric/Behavioral: Negative for depression, suicidal ideas, hallucinations, memory loss and substance abuse. The patient has insomnia. The patient is not nervous/anxious.     Objective:  Physical Exam  Constitutional: She is oriented to person, place, and time. She appears well-developed  and well-nourished. No distress.  HENT:  Head: Normocephalic and atraumatic.  Right Ear: External ear normal.  Left Ear: External ear normal.  Nose: Nose normal.  Mouth/Throat: Oropharynx is clear and moist.  Eyes: Conjunctivae and EOM are normal.  Neck: Normal range of motion. Neck supple.  Cardiovascular: Normal rate, regular rhythm, normal heart sounds and intact distal pulses.   No murmur heard. Respiratory: Effort normal and breath sounds normal. No respiratory distress. She has no wheezes.  GI: Soft. Bowel sounds are normal. She exhibits no distension. There is no tenderness.  Musculoskeletal:       Right hip: Normal.       Left hip: She exhibits decreased range of motion and decreased strength.       Right knee: Normal.       Left knee: Normal.  Right lower leg: She exhibits no tenderness and no swelling.       Left lower leg: She exhibits no tenderness and no swelling.  Her left hip can be flexed about 90, minimal internal rotation, about 20 to 30 of external rotation, 20 abduction.  Neurological: She is alert and oriented to person, place, and time. She has normal strength and normal reflexes. No cranial nerve deficit or sensory deficit.  Skin: No rash noted. She is not diaphoretic. No erythema.  Psychiatric: She has a normal mood and affect. Her behavior is normal.    Vitals Weight: 212 lb Height: 66 in Body Surface Area: 2.12 m Body Mass Index: 34.22 kg/m Pulse: 88 (Regular) BP: 142/78 (Sitting, Left Arm, Standard)  Imaging Review Plain radiographs demonstrate severe degenerative joint disease of the left hip(s). The bone quality appears to be adequate for age and reported activity level.  Assessment/Plan:  End stage arthritis, left hip   The patient history, physical examination, clinical judgment of the provider and imaging studies are consistent with end stage degenerative joint disease of the left knee(s) and total hip arthroplasty is deemed  medically necessary. The treatment options including medical management, injection therapy and arthroplasty were discussed at length. The risks and benefits of total knee arthroplasty were presented and reviewed. The risks due to aseptic loosening, infection, stiffness, thromboembolic complications and other imponderables were discussed. The patient acknowledged the explanation, agreed to proceed with the plan and consent was signed. Patient is being admitted for inpatient treatment for surgery, pain control, PT, OT, prophylactic antibiotics, VTE prophylaxis, progressive ambulation and ADL's and discharge planning. The patient is planning to be discharged home with home health services   [place], [place]

## 2013-10-09 ENCOUNTER — Inpatient Hospital Stay (HOSPITAL_COMMUNITY): Payer: 59

## 2013-10-09 ENCOUNTER — Encounter (HOSPITAL_COMMUNITY): Payer: 59 | Admitting: Anesthesiology

## 2013-10-09 ENCOUNTER — Inpatient Hospital Stay (HOSPITAL_COMMUNITY)
Admission: RE | Admit: 2013-10-09 | Discharge: 2013-10-10 | DRG: 470 | Disposition: A | Discharge status: Home Health | Payer: 59 | Source: Ambulatory Visit | Attending: Orthopedic Surgery | Admitting: Orthopedic Surgery

## 2013-10-09 ENCOUNTER — Encounter (HOSPITAL_COMMUNITY)
Admission: RE | Disposition: A | Discharge status: Home Health | Payer: Self-pay | Source: Ambulatory Visit | Attending: Orthopedic Surgery

## 2013-10-09 ENCOUNTER — Inpatient Hospital Stay (HOSPITAL_COMMUNITY): Payer: 59 | Admitting: Anesthesiology

## 2013-10-09 ENCOUNTER — Encounter (HOSPITAL_COMMUNITY): Payer: Self-pay | Admitting: *Deleted

## 2013-10-09 DIAGNOSIS — G4700 Insomnia, unspecified: Secondary | ICD-10-CM | POA: Diagnosis present

## 2013-10-09 DIAGNOSIS — F329 Major depressive disorder, single episode, unspecified: Secondary | ICD-10-CM | POA: Diagnosis present

## 2013-10-09 DIAGNOSIS — M1990 Unspecified osteoarthritis, unspecified site: Secondary | ICD-10-CM | POA: Diagnosis not present

## 2013-10-09 DIAGNOSIS — M25559 Pain in unspecified hip: Secondary | ICD-10-CM | POA: Diagnosis present

## 2013-10-09 DIAGNOSIS — Z6833 Body mass index (BMI) 33.0-33.9, adult: Secondary | ICD-10-CM

## 2013-10-09 DIAGNOSIS — M87052 Idiopathic aseptic necrosis of left femur: Secondary | ICD-10-CM | POA: Diagnosis present

## 2013-10-09 DIAGNOSIS — K219 Gastro-esophageal reflux disease without esophagitis: Secondary | ICD-10-CM | POA: Diagnosis present

## 2013-10-09 DIAGNOSIS — Z8673 Personal history of transient ischemic attack (TIA), and cerebral infarction without residual deficits: Secondary | ICD-10-CM | POA: Diagnosis not present

## 2013-10-09 DIAGNOSIS — Z96649 Presence of unspecified artificial hip joint: Secondary | ICD-10-CM | POA: Diagnosis not present

## 2013-10-09 DIAGNOSIS — E669 Obesity, unspecified: Secondary | ICD-10-CM | POA: Diagnosis present

## 2013-10-09 DIAGNOSIS — M259 Joint disorder, unspecified: Secondary | ICD-10-CM | POA: Diagnosis not present

## 2013-10-09 DIAGNOSIS — M169 Osteoarthritis of hip, unspecified: Secondary | ICD-10-CM | POA: Diagnosis present

## 2013-10-09 DIAGNOSIS — F3289 Other specified depressive episodes: Secondary | ICD-10-CM | POA: Diagnosis present

## 2013-10-09 DIAGNOSIS — I10 Essential (primary) hypertension: Secondary | ICD-10-CM | POA: Diagnosis present

## 2013-10-09 DIAGNOSIS — M1612 Unilateral primary osteoarthritis, left hip: Secondary | ICD-10-CM

## 2013-10-09 DIAGNOSIS — D62 Acute posthemorrhagic anemia: Secondary | ICD-10-CM

## 2013-10-09 DIAGNOSIS — M1610 Unilateral primary osteoarthritis, unspecified hip: Secondary | ICD-10-CM | POA: Diagnosis not present

## 2013-10-09 DIAGNOSIS — Z96642 Presence of left artificial hip joint: Secondary | ICD-10-CM

## 2013-10-09 DIAGNOSIS — E119 Type 2 diabetes mellitus without complications: Secondary | ICD-10-CM | POA: Diagnosis present

## 2013-10-09 DIAGNOSIS — M87059 Idiopathic aseptic necrosis of unspecified femur: Secondary | ICD-10-CM | POA: Diagnosis present

## 2013-10-09 DIAGNOSIS — F411 Generalized anxiety disorder: Secondary | ICD-10-CM | POA: Diagnosis present

## 2013-10-09 HISTORY — PX: TOTAL HIP ARTHROPLASTY: SHX124

## 2013-10-09 LAB — GLUCOSE, CAPILLARY
Glucose-Capillary: 109 mg/dL — ABNORMAL HIGH (ref 70–99)
Glucose-Capillary: 149 mg/dL — ABNORMAL HIGH (ref 70–99)
Glucose-Capillary: 137 mg/dL — ABNORMAL HIGH (ref 70–99)
Glucose-Capillary: 145 mg/dL — ABNORMAL HIGH (ref 70–99)
Glucose-Capillary: 143 mg/dL — ABNORMAL HIGH (ref 70–99)

## 2013-10-09 LAB — TYPE AND SCREEN
ABO/RH(D): A POS
Antibody Screen: NEGATIVE

## 2013-10-09 SURGERY — ARTHROPLASTY, HIP, TOTAL, ANTERIOR APPROACH
Anesthesia: General | Site: Hip | Laterality: Left

## 2013-10-09 MED ORDER — CLONAZEPAM 0.5 MG PO TABS
0.5000 mg | ORAL_TABLET | Freq: Two times a day (BID) | ORAL | Status: DC
  Administered 2013-10-09 – 2013-10-10 (×2): 0.5 mg via ORAL
  Filled 2013-10-09 (×2): qty 1

## 2013-10-09 MED ORDER — AMLODIPINE BESYLATE 5 MG PO TABS
5.0000 mg | ORAL_TABLET | Freq: Every morning | ORAL | Status: DC
  Administered 2013-10-10: 5 mg via ORAL
  Filled 2013-10-09 (×2): qty 1

## 2013-10-09 MED ORDER — BUPIVACAINE HCL (PF) 0.25 % IJ SOLN
INTRAMUSCULAR | Status: AC
  Filled 2013-10-09: qty 30

## 2013-10-09 MED ORDER — HYDROCHLOROTHIAZIDE 12.5 MG PO CAPS
12.5000 mg | ORAL_CAPSULE | ORAL | Status: DC
  Filled 2013-10-09: qty 1

## 2013-10-09 MED ORDER — ACETAMINOPHEN 10 MG/ML IV SOLN
1000.0000 mg | Freq: Once | INTRAVENOUS | Status: AC
  Administered 2013-10-09: 1000 mg via INTRAVENOUS
  Filled 2013-10-09: qty 100

## 2013-10-09 MED ORDER — LORATADINE-PSEUDOEPHEDRINE ER 5-120 MG PO TB12: 1.0000 | ORAL_TABLET | Freq: Every day | ORAL | Status: DC | PRN

## 2013-10-09 MED ORDER — ROCURONIUM BROMIDE 100 MG/10ML IV SOLN
INTRAVENOUS | Status: DC | PRN
Start: 2013-10-09 — End: 2013-10-09
  Administered 2013-10-09: 50 mg via INTRAVENOUS

## 2013-10-09 MED ORDER — PSEUDOEPHEDRINE HCL ER 120 MG PO TB12: 120.0000 mg | ORAL_TABLET | Freq: Every day | ORAL | Status: DC | PRN

## 2013-10-09 MED ORDER — TOPIRAMATE 25 MG PO TABS
25.0000 mg | ORAL_TABLET | Freq: Two times a day (BID) | ORAL | Status: DC
  Administered 2013-10-09 – 2013-10-10 (×3): 25 mg via ORAL
  Filled 2013-10-09 (×4): qty 1

## 2013-10-09 MED ORDER — SODIUM CHLORIDE 0.9 % IJ SOLN
INTRAMUSCULAR | Status: AC
  Filled 2013-10-09: qty 50

## 2013-10-09 MED ORDER — IRBESARTAN 150 MG PO TABS
150.0000 mg | ORAL_TABLET | ORAL | Status: DC
  Filled 2013-10-09: qty 1

## 2013-10-09 MED ORDER — LACTATED RINGERS IV SOLN
INTRAVENOUS | Status: DC | PRN
  Administered 2013-10-09 (×2): via INTRAVENOUS

## 2013-10-09 MED ORDER — ZOLPIDEM TARTRATE 5 MG PO TABS
5.0000 mg | ORAL_TABLET | Freq: Every day | ORAL | Status: DC
  Administered 2013-10-09: 5 mg via ORAL
  Filled 2013-10-09: qty 1

## 2013-10-09 MED ORDER — SODIUM CHLORIDE 0.9 % IJ SOLN
INTRAMUSCULAR | Status: DC | PRN
  Administered 2013-10-09: 30 mL

## 2013-10-09 MED ORDER — FLUTICASONE PROPIONATE HFA 44 MCG/ACT IN AERO
1.0000 | INHALATION_SPRAY | Freq: Two times a day (BID) | RESPIRATORY_TRACT | Status: DC
  Administered 2013-10-09 – 2013-10-10 (×3): 1 via RESPIRATORY_TRACT
  Filled 2013-10-09: qty 10.6

## 2013-10-09 MED ORDER — FLEET ENEMA 7-19 GM/118ML RE ENEM
1.0000 | ENEMA | Freq: Once | RECTAL | Status: AC | PRN
Start: 2013-10-09 — End: 2013-10-09

## 2013-10-09 MED ORDER — HYDROMORPHONE HCL PF 1 MG/ML IJ SOLN
0.2500 mg | INTRAMUSCULAR | Status: DC | PRN
  Administered 2013-10-09 (×4): 0.5 mg via INTRAVENOUS

## 2013-10-09 MED ORDER — ALBUTEROL SULFATE (2.5 MG/3ML) 0.083% IN NEBU: 3.0000 mL | INHALATION_SOLUTION | RESPIRATORY_TRACT | Status: DC | PRN

## 2013-10-09 MED ORDER — VALSARTAN-HYDROCHLOROTHIAZIDE 160-12.5 MG PO TABS: 0.5000 | ORAL_TABLET | ORAL | Status: DC

## 2013-10-09 MED ORDER — EPHEDRINE SULFATE 50 MG/ML IJ SOLN
INTRAMUSCULAR | Status: AC
  Filled 2013-10-09: qty 1

## 2013-10-09 MED ORDER — ARIPIPRAZOLE 5 MG PO TABS
2.5000 mg | ORAL_TABLET | Freq: Every evening | ORAL | Status: DC
  Administered 2013-10-09: 2.5 mg via ORAL
  Filled 2013-10-09 (×2): qty 1

## 2013-10-09 MED ORDER — ONDANSETRON HCL 4 MG PO TABS
4.0000 mg | ORAL_TABLET | Freq: Four times a day (QID) | ORAL | Status: DC | PRN
Start: 2013-10-09 — End: 2013-10-10

## 2013-10-09 MED ORDER — NEOSTIGMINE METHYLSULFATE 10 MG/10ML IV SOLN
INTRAVENOUS | Status: DC | PRN
  Administered 2013-10-09: 3 mg via INTRAVENOUS

## 2013-10-09 MED ORDER — CANAGLIFLOZIN-METFORMIN HCL 150-1000 MG PO TABS: 1.0000 | ORAL_TABLET | Freq: Every morning | ORAL | Status: DC

## 2013-10-09 MED ORDER — MIDAZOLAM HCL 2 MG/2ML IJ SOLN
INTRAMUSCULAR | Status: AC
  Filled 2013-10-09: qty 2

## 2013-10-09 MED ORDER — ACETAMINOPHEN 500 MG PO TABS
1000.0000 mg | ORAL_TABLET | Freq: Four times a day (QID) | ORAL | Status: AC
  Administered 2013-10-09 – 2013-10-10 (×4): 1000 mg via ORAL
  Filled 2013-10-09 (×4): qty 2

## 2013-10-09 MED ORDER — PROPOFOL 10 MG/ML IV BOLUS
INTRAVENOUS | Status: AC
  Filled 2013-10-09: qty 20

## 2013-10-09 MED ORDER — BUPIVACAINE LIPOSOME 1.3 % IJ SUSP
20.0000 mL | Freq: Once | INTRAMUSCULAR | Status: DC
  Filled 2013-10-09: qty 20

## 2013-10-09 MED ORDER — CANAGLIFLOZIN 300 MG PO TABS
150.0000 mg | ORAL_TABLET | Freq: Every day | ORAL | Status: DC
  Administered 2013-10-10: 150 mg via ORAL
  Filled 2013-10-09 (×2): qty 1

## 2013-10-09 MED ORDER — CEFAZOLIN SODIUM-DEXTROSE 2-3 GM-% IV SOLR
2.0000 g | INTRAVENOUS | Status: AC
  Administered 2013-10-09: 2 g via INTRAVENOUS

## 2013-10-09 MED ORDER — FENTANYL CITRATE 0.05 MG/ML IJ SOLN
INTRAMUSCULAR | Status: DC | PRN
  Administered 2013-10-09 (×2): 100 ug via INTRAVENOUS
  Administered 2013-10-09: 50 ug via INTRAVENOUS

## 2013-10-09 MED ORDER — BUPROPION HCL ER (XL) 300 MG PO TB24
450.0000 mg | ORAL_TABLET | Freq: Every morning | ORAL | Status: DC
  Administered 2013-10-09 – 2013-10-10 (×2): 450 mg via ORAL
  Filled 2013-10-09 (×2): qty 1

## 2013-10-09 MED ORDER — DEXAMETHASONE SODIUM PHOSPHATE 10 MG/ML IJ SOLN
INTRAMUSCULAR | Status: AC
  Filled 2013-10-09: qty 1

## 2013-10-09 MED ORDER — OXYCODONE HCL 5 MG PO TABS
5.0000 mg | ORAL_TABLET | ORAL | Status: DC | PRN
  Administered 2013-10-09: 5 mg via ORAL
  Administered 2013-10-09 – 2013-10-10 (×6): 10 mg via ORAL
  Filled 2013-10-09 (×2): qty 2
  Filled 2013-10-09: qty 1
  Filled 2013-10-09 (×4): qty 2

## 2013-10-09 MED ORDER — DM-GUAIFENESIN ER 30-600 MG PO TB12
1.0000 | ORAL_TABLET | Freq: Two times a day (BID) | ORAL | Status: DC | PRN
  Filled 2013-10-09: qty 1

## 2013-10-09 MED ORDER — 0.9 % SODIUM CHLORIDE (POUR BTL) OPTIME
TOPICAL | Status: DC | PRN
  Administered 2013-10-09: 1000 mL

## 2013-10-09 MED ORDER — CEFAZOLIN SODIUM-DEXTROSE 2-3 GM-% IV SOLR
2.0000 g | Freq: Four times a day (QID) | INTRAVENOUS | Status: AC
  Administered 2013-10-09 (×2): 2 g via INTRAVENOUS
  Filled 2013-10-09 (×2): qty 50

## 2013-10-09 MED ORDER — LIDOCAINE HCL (CARDIAC) 20 MG/ML IV SOLN
INTRAVENOUS | Status: DC | PRN
  Administered 2013-10-09: 60 mg via INTRAVENOUS

## 2013-10-09 MED ORDER — POLYETHYLENE GLYCOL 3350 17 G PO PACK
17.0000 g | PACK | Freq: Every day | ORAL | Status: DC | PRN
Start: 2013-10-09 — End: 2013-10-10

## 2013-10-09 MED ORDER — PHENOL 1.4 % MT LIQD: 1.0000 | OROMUCOSAL | Status: DC | PRN

## 2013-10-09 MED ORDER — INSULIN ASPART 100 UNIT/ML ~~LOC~~ SOLN
0.0000 [IU] | Freq: Three times a day (TID) | SUBCUTANEOUS | Status: DC
  Administered 2013-10-09 – 2013-10-10 (×2): 2 [IU] via SUBCUTANEOUS

## 2013-10-09 MED ORDER — SODIUM CHLORIDE 0.9 % IV SOLN: INTRAVENOUS | Status: DC

## 2013-10-09 MED ORDER — LACTATED RINGERS IV SOLN: INTRAVENOUS | Status: DC

## 2013-10-09 MED ORDER — PROPOFOL 10 MG/ML IV BOLUS
INTRAVENOUS | Status: DC | PRN
  Administered 2013-10-09: 150 mg via INTRAVENOUS

## 2013-10-09 MED ORDER — METOCLOPRAMIDE HCL 5 MG/ML IJ SOLN
5.0000 mg | Freq: Three times a day (TID) | INTRAMUSCULAR | Status: DC | PRN
  Administered 2013-10-09: 10 mg via INTRAVENOUS
  Filled 2013-10-09: qty 2

## 2013-10-09 MED ORDER — METHOCARBAMOL 500 MG PO TABS
500.0000 mg | ORAL_TABLET | Freq: Four times a day (QID) | ORAL | Status: DC | PRN
  Administered 2013-10-10: 500 mg via ORAL
  Filled 2013-10-09: qty 1

## 2013-10-09 MED ORDER — KETOROLAC TROMETHAMINE 15 MG/ML IJ SOLN
7.5000 mg | Freq: Four times a day (QID) | INTRAMUSCULAR | Status: AC | PRN
  Administered 2013-10-10: 7.5 mg via INTRAVENOUS
  Filled 2013-10-09: qty 1

## 2013-10-09 MED ORDER — DULOXETINE HCL 60 MG PO CPEP
60.0000 mg | ORAL_CAPSULE | Freq: Two times a day (BID) | ORAL | Status: DC
  Administered 2013-10-09 – 2013-10-10 (×2): 60 mg via ORAL
  Filled 2013-10-09 (×4): qty 1

## 2013-10-09 MED ORDER — ACETAMINOPHEN 650 MG RE SUPP: 650.0000 mg | Freq: Four times a day (QID) | RECTAL | Status: DC | PRN

## 2013-10-09 MED ORDER — HYDROMORPHONE HCL PF 1 MG/ML IJ SOLN
INTRAMUSCULAR | Status: AC
  Filled 2013-10-09: qty 1

## 2013-10-09 MED ORDER — BUPIVACAINE HCL (PF) 0.25 % IJ SOLN
INTRAMUSCULAR | Status: DC | PRN
  Administered 2013-10-09: 30 mL

## 2013-10-09 MED ORDER — KETAMINE HCL 10 MG/ML IJ SOLN
INTRAMUSCULAR | Status: DC | PRN
  Administered 2013-10-09 (×4): 10 mg via INTRAVENOUS

## 2013-10-09 MED ORDER — ONDANSETRON HCL 4 MG/2ML IJ SOLN
4.0000 mg | Freq: Four times a day (QID) | INTRAMUSCULAR | Status: DC | PRN
  Administered 2013-10-09 – 2013-10-10 (×2): 4 mg via INTRAVENOUS
  Filled 2013-10-09 (×2): qty 2

## 2013-10-09 MED ORDER — MENTHOL 3 MG MT LOZG: 1.0000 | LOZENGE | OROMUCOSAL | Status: DC | PRN

## 2013-10-09 MED ORDER — BISACODYL 10 MG RE SUPP: 10.0000 mg | Freq: Every day | RECTAL | Status: DC | PRN

## 2013-10-09 MED ORDER — SODIUM CHLORIDE 0.9 % IV SOLN
INTRAVENOUS | Status: DC
  Administered 2013-10-09 – 2013-10-10 (×3): via INTRAVENOUS

## 2013-10-09 MED ORDER — LORATADINE 10 MG PO TABS
10.0000 mg | ORAL_TABLET | Freq: Every day | ORAL | Status: DC | PRN
  Filled 2013-10-09: qty 1

## 2013-10-09 MED ORDER — TRANEXAMIC ACID 100 MG/ML IV SOLN
2000.0000 mg | Freq: Once | INTRAVENOUS | Status: DC
  Filled 2013-10-09: qty 20

## 2013-10-09 MED ORDER — ROPINIROLE HCL 1 MG PO TABS
2.0000 mg | ORAL_TABLET | Freq: Two times a day (BID) | ORAL | Status: DC
Start: 2013-10-09 — End: 2013-10-10
  Administered 2013-10-09 – 2013-10-10 (×2): 2 mg via ORAL
  Filled 2013-10-09 (×3): qty 2

## 2013-10-09 MED ORDER — DEXAMETHASONE 6 MG PO TABS
10.0000 mg | ORAL_TABLET | Freq: Every day | ORAL | Status: AC
  Administered 2013-10-10: 10 mg via ORAL
  Filled 2013-10-09: qty 1

## 2013-10-09 MED ORDER — METHOCARBAMOL 1000 MG/10ML IJ SOLN
500.0000 mg | Freq: Four times a day (QID) | INTRAVENOUS | Status: DC | PRN
  Administered 2013-10-09 (×2): 500 mg via INTRAVENOUS
  Filled 2013-10-09 (×2): qty 5

## 2013-10-09 MED ORDER — DEXAMETHASONE SODIUM PHOSPHATE 10 MG/ML IJ SOLN
10.0000 mg | Freq: Once | INTRAMUSCULAR | Status: AC
  Administered 2013-10-09: 10 mg via INTRAVENOUS

## 2013-10-09 MED ORDER — TRANEXAMIC ACID 100 MG/ML IV SOLN
2000.0000 mg | INTRAVENOUS | Status: DC | PRN
  Administered 2013-10-09: 2000 mg via INTRAVENOUS

## 2013-10-09 MED ORDER — GABAPENTIN 300 MG PO CAPS
300.0000 mg | ORAL_CAPSULE | Freq: Every day | ORAL | Status: DC
  Administered 2013-10-09: 300 mg via ORAL
  Filled 2013-10-09 (×2): qty 1

## 2013-10-09 MED ORDER — ROCURONIUM BROMIDE 100 MG/10ML IV SOLN
INTRAVENOUS | Status: AC
  Filled 2013-10-09: qty 1

## 2013-10-09 MED ORDER — PANTOPRAZOLE SODIUM 40 MG PO TBEC
40.0000 mg | DELAYED_RELEASE_TABLET | Freq: Two times a day (BID) | ORAL | Status: DC
  Administered 2013-10-09 – 2013-10-10 (×2): 40 mg via ORAL
  Filled 2013-10-09 (×3): qty 1

## 2013-10-09 MED ORDER — CHLORHEXIDINE GLUCONATE 4 % EX LIQD: 60.0000 mL | Freq: Once | CUTANEOUS | Status: DC

## 2013-10-09 MED ORDER — BUPIVACAINE LIPOSOME 1.3 % IJ SUSP
INTRAMUSCULAR | Status: DC | PRN
  Administered 2013-10-09: 20 mL

## 2013-10-09 MED ORDER — FENTANYL CITRATE 0.05 MG/ML IJ SOLN
INTRAMUSCULAR | Status: AC
  Filled 2013-10-09: qty 5

## 2013-10-09 MED ORDER — VALACYCLOVIR HCL 500 MG PO TABS
1000.0000 mg | ORAL_TABLET | Freq: Every day | ORAL | Status: DC
  Administered 2013-10-09 – 2013-10-10 (×2): 1000 mg via ORAL
  Filled 2013-10-09 (×2): qty 2

## 2013-10-09 MED ORDER — DEXAMETHASONE SODIUM PHOSPHATE 10 MG/ML IJ SOLN
10.0000 mg | Freq: Every day | INTRAMUSCULAR | Status: AC
  Filled 2013-10-09: qty 1

## 2013-10-09 MED ORDER — METFORMIN HCL 500 MG PO TABS
1000.0000 mg | ORAL_TABLET | Freq: Every day | ORAL | Status: DC
  Filled 2013-10-09 (×2): qty 2

## 2013-10-09 MED ORDER — ACETAMINOPHEN 325 MG PO TABS
650.0000 mg | ORAL_TABLET | Freq: Four times a day (QID) | ORAL | Status: DC | PRN
  Administered 2013-10-10: 650 mg via ORAL
  Filled 2013-10-09: qty 2

## 2013-10-09 MED ORDER — HYDROMORPHONE HCL PF 2 MG/ML IJ SOLN
INTRAMUSCULAR | Status: AC
  Filled 2013-10-09: qty 1

## 2013-10-09 MED ORDER — LIDOCAINE HCL (CARDIAC) 20 MG/ML IV SOLN
INTRAVENOUS | Status: AC
  Filled 2013-10-09: qty 5

## 2013-10-09 MED ORDER — DIPHENHYDRAMINE HCL 12.5 MG/5ML PO ELIX: 12.5000 mg | ORAL_SOLUTION | ORAL | Status: DC | PRN

## 2013-10-09 MED ORDER — GLYCOPYRROLATE 0.2 MG/ML IJ SOLN
INTRAMUSCULAR | Status: DC | PRN
  Administered 2013-10-09: 0.4 mg via INTRAVENOUS

## 2013-10-09 MED ORDER — CEFAZOLIN SODIUM-DEXTROSE 2-3 GM-% IV SOLR
INTRAVENOUS | Status: AC
  Filled 2013-10-09: qty 50

## 2013-10-09 MED ORDER — HYDROMORPHONE HCL PF 1 MG/ML IJ SOLN
INTRAMUSCULAR | Status: DC | PRN
  Administered 2013-10-09: 0.5 mg via INTRAVENOUS

## 2013-10-09 MED ORDER — DOCUSATE SODIUM 100 MG PO CAPS
100.0000 mg | ORAL_CAPSULE | Freq: Two times a day (BID) | ORAL | Status: DC
  Administered 2013-10-09 – 2013-10-10 (×2): 100 mg via ORAL

## 2013-10-09 MED ORDER — ONDANSETRON HCL 4 MG/2ML IJ SOLN
INTRAMUSCULAR | Status: DC | PRN
  Administered 2013-10-09: 4 mg via INTRAVENOUS

## 2013-10-09 MED ORDER — ONDANSETRON HCL 4 MG/2ML IJ SOLN
INTRAMUSCULAR | Status: AC
  Filled 2013-10-09: qty 2

## 2013-10-09 MED ORDER — MIDAZOLAM HCL 5 MG/5ML IJ SOLN
INTRAMUSCULAR | Status: DC | PRN
  Administered 2013-10-09: 2 mg via INTRAVENOUS

## 2013-10-09 MED ORDER — MORPHINE SULFATE 2 MG/ML IJ SOLN
1.0000 mg | INTRAMUSCULAR | Status: DC | PRN
  Administered 2013-10-09 (×2): 1 mg via INTRAVENOUS
  Filled 2013-10-09 (×2): qty 1

## 2013-10-09 MED ORDER — METOCLOPRAMIDE HCL 10 MG PO TABS: 5.0000 mg | ORAL_TABLET | Freq: Three times a day (TID) | ORAL | Status: DC | PRN

## 2013-10-09 MED ORDER — RIVAROXABAN 10 MG PO TABS
10.0000 mg | ORAL_TABLET | Freq: Every day | ORAL | Status: DC
  Administered 2013-10-10: 10 mg via ORAL
  Filled 2013-10-09 (×2): qty 1

## 2013-10-09 MED ORDER — SODIUM CHLORIDE 0.9 % IJ SOLN
INTRAMUSCULAR | Status: AC
  Filled 2013-10-09: qty 10

## 2013-10-09 MED ORDER — KETAMINE HCL 10 MG/ML IJ SOLN
INTRAMUSCULAR | Status: AC
  Filled 2013-10-09: qty 1

## 2013-10-09 SURGICAL SUPPLY — 42 items
BAG ZIPLOCK 12X15 (MISCELLANEOUS) IMPLANT
BLADE EXTENDED COATED 6.5IN (ELECTRODE) ×2 IMPLANT
BLADE SAW SGTL 18X1.27X75 (BLADE) ×2 IMPLANT
CAPT HIP PF COP ×2 IMPLANT
COVER PERINEAL POST (MISCELLANEOUS) ×2 IMPLANT
DECANTER SPIKE VIAL GLASS SM (MISCELLANEOUS) ×2 IMPLANT
DRAPE C-ARM 42X120 X-RAY (DRAPES) ×2 IMPLANT
DRAPE STERI IOBAN 125X83 (DRAPES) ×2 IMPLANT
DRAPE U-SHAPE 47X51 STRL (DRAPES) ×6 IMPLANT
DRSG ADAPTIC 3X8 NADH LF (GAUZE/BANDAGES/DRESSINGS) ×2 IMPLANT
DRSG MEPILEX BORDER 4X4 (GAUZE/BANDAGES/DRESSINGS) ×2 IMPLANT
DRSG MEPILEX BORDER 4X8 (GAUZE/BANDAGES/DRESSINGS) ×2 IMPLANT
DURAPREP 26ML APPLICATOR (WOUND CARE) ×2 IMPLANT
ELECT REM PT RETURN 9FT ADLT (ELECTROSURGICAL) ×2
ELECTRODE REM PT RTRN 9FT ADLT (ELECTROSURGICAL) ×1 IMPLANT
EVACUATOR 1/8 PVC DRAIN (DRAIN) ×2 IMPLANT
FACESHIELD WRAPAROUND (MASK) ×8 IMPLANT
GAUZE SPONGE 4X4 12PLY STRL (GAUZE/BANDAGES/DRESSINGS) IMPLANT
GLOVE BIO SURGEON STRL SZ7.5 (GLOVE) ×2 IMPLANT
GLOVE BIO SURGEON STRL SZ8 (GLOVE) ×4 IMPLANT
GLOVE BIOGEL PI IND STRL 8 (GLOVE) ×2 IMPLANT
GLOVE BIOGEL PI INDICATOR 8 (GLOVE) ×2
GOWN STRL REUS W/TWL LRG LVL3 (GOWN DISPOSABLE) ×2 IMPLANT
GOWN STRL REUS W/TWL XL LVL3 (GOWN DISPOSABLE) ×2 IMPLANT
KIT BASIN OR (CUSTOM PROCEDURE TRAY) ×2 IMPLANT
NDL SAFETY ECLIPSE 18X1.5 (NEEDLE) ×2 IMPLANT
NEEDLE HYPO 18GX1.5 SHARP (NEEDLE) ×2
PACK TOTAL JOINT (CUSTOM PROCEDURE TRAY) ×2 IMPLANT
PADDING CAST COTTON 6X4 STRL (CAST SUPPLIES) IMPLANT
SPONGE GAUZE 4X4 12PLY (GAUZE/BANDAGES/DRESSINGS) ×2 IMPLANT
STRIP CLOSURE SKIN 1/2X4 (GAUZE/BANDAGES/DRESSINGS) ×2 IMPLANT
SUCTION FRAZIER 12FR DISP (SUCTIONS) IMPLANT
SUT ETHIBOND NAB CT1 #1 30IN (SUTURE) ×2 IMPLANT
SUT MNCRL AB 4-0 PS2 18 (SUTURE) ×2 IMPLANT
SUT VIC AB 2-0 CT1 27 (SUTURE) ×2
SUT VIC AB 2-0 CT1 TAPERPNT 27 (SUTURE) ×2 IMPLANT
SUT VLOC 180 0 24IN GS25 (SUTURE) ×2 IMPLANT
SYRINGE 20CC LL (MISCELLANEOUS) ×2 IMPLANT
SYRINGE 60CC LL (MISCELLANEOUS) ×2 IMPLANT
TOWEL OR 17X26 10 PK STRL BLUE (TOWEL DISPOSABLE) ×2 IMPLANT
TRAY FOLEY CATH 14FRSI W/METER (CATHETERS) ×2 IMPLANT
WATER STERILE IRR 1500ML POUR (IV SOLUTION) ×2 IMPLANT

## 2013-10-09 NOTE — Anesthesia Preprocedure Evaluation (Addendum)
Anesthesia Evaluation  Patient identified by MRN, date of birth, ID band Patient awake    Reviewed: Allergy & Precautions, H&P , NPO status , Patient's Chart, lab work & pertinent test results  History of Anesthesia Complications (+) PONV  Airway Mallampati: II TM Distance: >3 FB Neck ROM: full    Dental  (+) Edentulous Upper, Missing, Dental Advisory Given All front lower teeth are missing:   Pulmonary neg pulmonary ROS, shortness of breath and with exertion,  breath sounds clear to auscultation  Pulmonary exam normal       Cardiovascular Exercise Tolerance: Good hypertension, Pt. on medications negative cardio ROS  Rhythm:regular Rate:Normal     Neuro/Psych Anxiety Depression TIAnegative neurological ROS  negative psych ROS   GI/Hepatic negative GI ROS, Neg liver ROS, GERD-  Medicated and Controlled,  Endo/Other  negative endocrine ROSdiabetes, Well Controlled, Type 2, Oral Hypoglycemic Agents  Renal/GU negative Renal ROS  negative genitourinary   Musculoskeletal   Abdominal   Peds  Hematology negative hematology ROS (+)   Anesthesia Other Findings   Reproductive/Obstetrics negative OB ROS                        Anesthesia Physical Anesthesia Plan  ASA: III  Anesthesia Plan:    Post-op Pain Management:    Induction:   Airway Management Planned:   Additional Equipment:   Intra-op Plan:   Post-operative Plan:   Informed Consent: I have reviewed the patients History and Physical, chart, labs and discussed the procedure including the risks, benefits and alternatives for the proposed anesthesia with the patient or authorized representative who has indicated his/her understanding and acceptance.   Dental Advisory Given  Plan Discussed with: CRNA and Surgeon  Anesthesia Plan Comments:         Anesthesia Quick Evaluation

## 2013-10-09 NOTE — Interval H&P Note (Signed)
History and Physical Interval Note:  10/09/2013 8:09 AM  Deanna Rowe  has presented today for surgery, with the diagnosis of OA LEFT HIP  The various methods of treatment have been discussed with the patient and family. After consideration of risks, benefits and other options for treatment, the patient has consented to  Procedure(s): LEFT TOTAL HIP ARTHROPLASTY ANTERIOR APPROACH (Left) as a surgical intervention .  The patient's history has been reviewed, patient examined, no change in status, stable for surgery.  I have reviewed the patient's chart and labs.  Questions were answered to the patient's satisfaction.     [name]

## 2013-10-09 NOTE — Evaluation (Signed)
Physical Therapy Evaluation Patient Details Name: Deanna Rowe MRN: [id number] DOB: 1955-07-05 Today's Date: 10/09/2013   History of Present Illness  L THR   Clinical Impression  [name] presents with decreased L LE strength/ROM and post op pain limiting functional mobility.  Pt should progress well to d/c home with family assist and HHPT follow up    Follow Up Recommendations Home health PT    Equipment Recommendations  None recommended by PT    Recommendations for Other Services OT consult     Precautions / Restrictions Precautions Precautions: Fall Restrictions Weight Bearing Restrictions: No Other Position/Activity Restrictions: WBAT      Mobility  Bed Mobility Overal bed mobility: Needs Assistance Bed Mobility: Supine to Sit     Supine to sit: Min assist;Mod assist     General bed mobility comments: cues for sequence and use of R LE to self assist  Transfers Overall transfer level: Needs assistance Equipment used: Rolling walker (2 wheeled) Transfers: Sit to/from Stand Sit to Stand: Min assist;Mod assist         General transfer comment: cues for LE management and use of UEs to self assist  Ambulation/Gait Ambulation/Gait assistance: Min assist;Mod assist Ambulation Distance (Feet): 38 Feet Assistive device: Rolling walker (2 wheeled) Gait Pattern/deviations: Step-to pattern;Decreased step length - right;Decreased step length - left;Shuffle;Trunk flexed Gait velocity: decr   General Gait Details: cues for posture, position from RW, stride length and sequence  Stairs            Wheelchair Mobility    Modified Rankin (Stroke Patients Only)       Balance                                             Pertinent Vitals/Pain 5/10; premed, ice pack provided    Home Living Family/patient expects to be discharged to:: Private residence Living Arrangements: Spouse/significant other Available Help at Discharge:  Family Type of Home: House Home Access: Stairs to enter Entrance Stairs-Rails: None Entrance Stairs-Number of Steps: 3 Home Layout: Two level;Able to live on main level with bedroom/bathroom Home Equipment: Walker - 2 wheels      Prior Function Level of Independence: Independent with assistive device(s)         Comments: using cane     Hand Dominance   Dominant Hand: Right    Extremity/Trunk Assessment   Upper Extremity Assessment: Overall WFL for tasks assessed           Lower Extremity Assessment: LLE deficits/detail   LLE Deficits / Details: Hip strength 2+/5 with AAROM at hip to 80 flex and 15 abd  Cervical / Trunk Assessment: Normal  Communication   Communication: No difficulties  Cognition Arousal/Alertness: Awake/alert Behavior During Therapy: WFL for tasks assessed/performed Overall Cognitive Status: Within Functional Limits for tasks assessed                      General Comments      Exercises Total Joint Exercises Ankle Circles/Pumps: AROM;Both;15 reps;Supine Quad Sets: AROM;Both;10 reps;Supine Heel Slides: AAROM;Left;15 reps;Supine Hip ABduction/ADduction: AAROM;Left;10 reps;Supine      Assessment/Plan    PT Assessment Patient needs continued PT services  PT Diagnosis Difficulty walking   PT Problem List Decreased strength;Decreased range of motion;Decreased activity tolerance;Decreased mobility;Decreased knowledge of use of DME;Pain  PT Treatment Interventions DME instruction;Gait  training;Stair training;Functional mobility training;Therapeutic activities;Therapeutic exercise;Patient/family education   PT Goals (Current goals can be found in the Care Plan section) Acute Rehab PT Goals Patient Stated Goal: Resume previous lifestyle with decreased pain PT Goal Formulation: With patient Time For Goal Achievement: 10/16/13 Potential to Achieve Goals: Good    Frequency 7X/week   Barriers to discharge        Co-evaluation                End of Session Equipment Utilized During Treatment: Gait belt Activity Tolerance: Patient tolerated treatment well Patient left: in chair;with call bell/phone within reach;with family/visitor present Nurse Communication: Mobility status         Time: 7001-7494 PT Time Calculation (min): 29 min   Charges:   PT Evaluation $Initial PT Evaluation Tier I: 1 Procedure PT Treatments $Gait Training: 8-22 mins $Therapeutic Exercise: 8-22 mins   PT G Codes:          Ifeoluwa Beller 10/09/2013, 4:55 PM

## 2013-10-09 NOTE — Plan of Care (Signed)
Problem: Consults Goal: Diagnosis- Total Joint Replacement Primary Total Hip     

## 2013-10-09 NOTE — Transfer of Care (Signed)
Immediate Anesthesia Transfer of Care Note  Patient: Deanna Rowe  Procedure(s) Performed: Procedure(s): LEFT TOTAL HIP ARTHROPLASTY ANTERIOR APPROACH (Left)  Patient Location: PACU  Anesthesia Type:General  Level of Consciousness: awake, alert  and oriented  Airway & Oxygen Therapy: Patient Spontanous Breathing and Patient connected to face mask oxygen  Post-op Assessment: Report given to PACU RN and Post -op Vital signs reviewed and stable  Post vital signs: Reviewed and stable  Complications: No apparent anesthesia complications

## 2013-10-09 NOTE — Op Note (Addendum)
OPERATIVE REPORT  PREOPERATIVE DIAGNOSIS: Osteonecrosis of the Left hip.   POSTOPERATIVE DIAGNOSIS: Osteonecrosis of the Left  hip.   PROCEDURE: Left total hip arthroplasty, anterior approach.   SURGEON: Gaynelle Arabian, MD   ASSISTANT: Arlee Muslim, PA-C  ANESTHESIA:  General  ESTIMATED BLOOD LOSS:-500 ml  DRAINS: Hemovac x1.   COMPLICATIONS: None   CONDITION: PACU - hemodynamically stable.   BRIEF CLINICAL NOTE: Deanna Rowe is a 58 y.o. female who has advanced osteonecrosis With secondary  arthritis of her Left  hip with progressively worsening pain and  dysfunction.The patient has failed nonoperative management and presents for  total hip arthroplasty.   PROCEDURE IN DETAIL: After successful administration of spinal  anesthetic, the traction boots for the [facility] bed were placed on both  feet and the patient was placed onto the [facility] bed, boots placed into the leg  holders. The Left hip was then isolated from the perineum with plastic  drapes and prepped and draped in the usual sterile fashion. ASIS and  greater trochanter were marked and a oblique incision was made, starting  at about 1 cm lateral and 2 cm distal to the ASIS and coursing towards  the anterior cortex of the femur. The skin was cut with a 10 blade  through subcutaneous tissue to the level of the fascia overlying the  tensor fascia lata muscle. The fascia was then incised in line with the  incision at the junction of the anterior third and posterior 2/3rd. The  muscle was teased off the fascia and then the interval between the TFL  and the rectus was developed. The Hohmann retractor was then placed at  the top of the femoral neck over the capsule. The vessels overlying the  capsule were cauterized and the fat on top of the capsule was removed.  A Hohmann retractor was then placed anterior underneath the rectus  femoris to give exposure to the entire anterior capsule. A T-shaped  capsulotomy was  performed. The edges were tagged and the femoral head  was identified.       Osteophytes are removed off the superior acetabulum.  The femoral neck was then cut in situ with an oscillating saw. Traction  was then applied to the left lower extremity utilizing the [facility]  traction. The femoral head was then removed. Retractors were placed  around the acetabulum and then circumferential removal of the labrum was  performed. Osteophytes were also removed. Reaming starts at 45 mm to  medialize and  Increased in 2 mm increments to 51 mm. We reamed in  approximately 40 degrees of abduction, 20 degrees anteversion. A 52 mm  pinnacle acetabular shell was then impacted in anatomic position under  fluoroscopic guidance with excellent purchase. We did not need to place  any additional dome screws. A 32 mm neutral + 4 marathon liner was then  placed into the acetabular shell.       The femoral lift was then placed along the lateral aspect of the femur  just distal to the vastus ridge. The leg was  externally rotated and capsule  was stripped off the inferior aspect of the femoral neck down to the  level of the lesser trochanter, this was done with electrocautery. The femur was lifted after this was performed. The  leg was then placed and extended in adducted position to essentially delivering the femur. We also removed the capsule superiorly and the  piriformis from the piriformis  fossa to gain excellent exposure of the  proximal femur. Rongeur was used to remove some cancellous bone to get  into the lateral portion of the proximal femur for placement of the  initial starter reamer. The starter broaches was placed  the starter broach  and was shown to go down the center of the canal. Broaching  with the  Corail system was then performed starting at size 8, coursing  Up to size 11. A size 11 had excellent torsional and rotational  and axial stability. The trial high offset neck was then placed  with a 32 +  1 trial head. The hip was then reduced. We confirmed that  the stem was in the canal both on AP and lateral x-rays. It also has excellent sizing. The hip was reduced with outstanding stability through full extension, full external rotation,  and then flexion in adduction internal rotation. AP pelvis was taken  and the leg lengths were measured and found to be exactly equal. Hip  was then dislocated again and the femoral head and neck removed. The  femoral broach was removed. Size 11 Corail stem with a high offset  neck was then impacted into the femur following native anteversion. Has  excellent purchase in the canal. Excellent torsional and rotational and  axial stability. It is confirmed to be in the canal on AP and lateral  fluoroscopic views. The 32 + 1 ceramic head was placed and the hip  reduced with outstanding stability. Again AP pelvis was taken and it  confirmed that the leg lengths were equal. The wound was then copiously  irrigated with saline solution and the capsule reattached and repaired  with Ethibond suture.  20 mL of Exparel mixed with 50 mL of saline then additional 20 ml of .25% Bupivicaine injected into the capsule and into the edge of the tensor fascia lata as well as subcutaneous tissue. The fascia overlying the tensor fascia lata was then closed with a running #1 V-Loc. 2 grams of tranexamic acid mixed with 60 ml normal saline was then injected into the wound bed below the fascia. Subcu was closed with interrupted  2-0 Vicryl and subcuticular running 4-0 Monocryl. Incision was cleaned  and dried. Steri-Strips and a bulky sterile dressing applied. Hemovac  drain was hooked to suction and then he was awakened and transported to  recovery in stable condition.        Please note that a surgical assistant was a medical necessity for this procedure to perform it in a safe and expeditious manner. Assistant was necessary to provide appropriate retraction of vital neurovascular  structures and to prevent femoral fracture and allow for anatomic placement of the prosthesis.  Gaynelle Arabian, M.D.

## 2013-10-09 NOTE — Anesthesia Postprocedure Evaluation (Signed)
  Anesthesia Post-op Note  Patient: Deanna Rowe  Procedure(s) Performed: Procedure(s) (LRB): LEFT TOTAL HIP ARTHROPLASTY ANTERIOR APPROACH (Left)  Patient Location: PACU  Anesthesia Type: General  Level of Consciousness: awake and alert   Airway and Oxygen Therapy: Patient Spontanous Breathing  Post-op Pain: mild  Post-op Assessment: Post-op Vital signs reviewed, Patient's Cardiovascular Status Stable, Respiratory Function Stable, Patent Airway and No signs of Nausea or vomiting  Last Vitals:  Filed Vitals:   10/09/13 1100  BP: 119/79  Pulse: 104  Temp: 36.9 C  Resp: 11    Post-op Vital Signs: stable   Complications: No apparent anesthesia complications

## 2013-10-10 DIAGNOSIS — D62 Acute posthemorrhagic anemia: Secondary | ICD-10-CM

## 2013-10-10 LAB — CBC
HCT: 33.5 % — ABNORMAL LOW (ref 36.0–46.0)
Hemoglobin: 10.7 g/dL — ABNORMAL LOW (ref 12.0–15.0)
MCH: 32.3 pg (ref 26.0–34.0)
MCHC: 31.9 g/dL (ref 30.0–36.0)
MCV: 101.2 fL — ABNORMAL HIGH (ref 78.0–100.0)
Platelets: 229 10*3/uL (ref 150–400)
RBC: 3.31 MIL/uL — ABNORMAL LOW (ref 3.87–5.11)
RDW: 13.7 % (ref 11.5–15.5)
WBC: 10 10*3/uL (ref 4.0–10.5)

## 2013-10-10 LAB — BASIC METABOLIC PANEL
Anion gap: 9 (ref 5–15)
BUN: 15 mg/dL (ref 6–23)
CO2: 26 mEq/L (ref 19–32)
Calcium: 9 mg/dL (ref 8.4–10.5)
Chloride: 103 mEq/L (ref 96–112)
Creatinine, Ser: 1.03 mg/dL (ref 0.50–1.10)
GFR calc Af Amer: 68 mL/min — ABNORMAL LOW (ref >90)
GFR calc non Af Amer: 59 mL/min — ABNORMAL LOW (ref >90)
Glucose, Bld: 118 mg/dL — ABNORMAL HIGH (ref 70–99)
Potassium: 4.6 mEq/L (ref 3.7–5.3)
Sodium: 138 mEq/L (ref 137–147)

## 2013-10-10 LAB — GLUCOSE, CAPILLARY
Glucose-Capillary: 121 mg/dL — ABNORMAL HIGH (ref 70–99)
Glucose-Capillary: 114 mg/dL — ABNORMAL HIGH (ref 70–99)

## 2013-10-10 MED ORDER — METHOCARBAMOL 500 MG PO TABS: 500.0000 mg | ORAL_TABLET | Freq: Four times a day (QID) | ORAL | Status: DC | PRN

## 2013-10-10 MED ORDER — RIVAROXABAN 10 MG PO TABS: 10.0000 mg | ORAL_TABLET | Freq: Every day | ORAL | Status: DC

## 2013-10-10 MED ORDER — IRBESARTAN 150 MG PO TABS: 150.0000 mg | ORAL_TABLET | ORAL | Status: DC

## 2013-10-10 MED ORDER — HYDROCHLOROTHIAZIDE 12.5 MG PO CAPS: 12.5000 mg | ORAL_CAPSULE | ORAL | Status: DC

## 2013-10-10 MED ORDER — OXYCODONE HCL 5 MG PO TABS: 5.0000 mg | ORAL_TABLET | ORAL | Status: DC | PRN

## 2013-10-10 NOTE — Progress Notes (Signed)
Came to visit patient on behalf of [employer] program/ [employer] for [employer] employees/dependents with [facility] insurance. Patient reports she is active with [employer] program with the Pharm[employer] for DM management. Agreeable to post hospital discharge call as well. She will continue to follow up with [employer] post hospital discharge. Appreciative of visit.  [facility] [phone]

## 2013-10-10 NOTE — Discharge Summary (Signed)
Physician Discharge Summary   Patient ID: Deanna Rowe MRN: [id number] DOB/AGE: 05-Sep-1955 58 y.o.  Admit date: 10/09/2013 Discharge date: 10/10/2013  Primary Diagnosis:  Osteonecrosis of the Left hip.   Admission Diagnoses:  Past Medical History  Diagnosis Date  . Diabetes mellitus   . Hypertension   . Obesity   . Anxiety   . Depression   . Diabetes mellitus, type II   . PONV (postoperative nausea and vomiting)   . Dry cough   . TIA (transient ischemic attack) 2012    no residual problems  . Arthritis     L HIP  . Avascular necrosis of hip     LEFT  . GERD (gastroesophageal reflux disease)   . Urgency incontinence   . Insomnia     takes Ambien nightly   Discharge Diagnoses:   Principal Problem:   Avascular necrosis of bone of left hip Active Problems:   OA (osteoarthritis) of hip   Postoperative anemia due to acute blood loss  Estimated body mass index is 33.35 kg/(m^2) as calculated from the following:   Height as of this encounter: 5' 6" (1.676 m).   Weight as of this encounter: 93.668 kg (206 lb 8 oz).  Procedure(s) (LRB): LEFT TOTAL HIP ARTHROPLASTY ANTERIOR APPROACH (Left)   Consults: None  HPI: Deanna Rowe is a 58 y.o. female who has advanced osteonecrosis  With secondary arthritis of her Left hip with progressively worsening pain and  dysfunction.The patient has failed nonoperative management and presents for  total hip arthroplasty.  Laboratory Data: Admission on 10/09/2013, Discharged on 10/10/2013  Component Date Value Ref Range Status  . Glucose-Capillary 10/09/2013 109* 70 - 99 mg/dL Final  . Comment 1 10/09/2013 Notify RN   Final  . Glucose-Capillary 10/09/2013 149* 70 - 99 mg/dL Final  . Comment 1 10/09/2013 Documented in Chart   Final  . Comment 2 10/09/2013 Notify RN   Final  . Glucose-Capillary 10/09/2013 137* 70 - 99 mg/dL Final  . Comment 1 10/09/2013 Notify RN   Final  . Comment 2 10/09/2013 Documented in Chart   Final  . WBC  10/10/2013 10.0  4.0 - 10.5 K/uL Final  . RBC 10/10/2013 3.31* 3.87 - 5.11 MIL/uL Final  . Hemoglobin 10/10/2013 10.7* 12.0 - 15.0 g/dL Final  . HCT 10/10/2013 33.5* 36.0 - 46.0 % Final  . MCV 10/10/2013 101.2* 78.0 - 100.0 fL Final  . MCH 10/10/2013 32.3  26.0 - 34.0 pg Final  . MCHC 10/10/2013 31.9  30.0 - 36.0 g/dL Final  . RDW 10/10/2013 13.7  11.5 - 15.5 % Final  . Platelets 10/10/2013 229  150 - 400 K/uL Final  . Sodium 10/10/2013 138  137 - 147 mEq/L Final  . Potassium 10/10/2013 4.6  3.7 - 5.3 mEq/L Final  . Chloride 10/10/2013 103  96 - 112 mEq/L Final  . CO2 10/10/2013 26  19 - 32 mEq/L Final  . Glucose, Bld 10/10/2013 118* 70 - 99 mg/dL Final  . BUN 10/10/2013 15  6 - 23 mg/dL Final  . Creatinine, Ser 10/10/2013 1.03  0.50 - 1.10 mg/dL Final  . Calcium 10/10/2013 9.0  8.4 - 10.5 mg/dL Final  . GFR calc non Af Amer 10/10/2013 59* >90 mL/min Final  . GFR calc Af Amer 10/10/2013 68* >90 mL/min Final   Comment: (NOTE)  The eGFR has been calculated using the CKD EPI equation.                          This calculation has not been validated in all clinical situations.                          eGFR's persistently <90 mL/min signify possible Chronic Kidney                          Disease.  . Anion gap 10/10/2013 9  5 - 15 Final  . Glucose-Capillary 10/09/2013 145* 70 - 99 mg/dL Final  . Glucose-Capillary 10/09/2013 143* 70 - 99 mg/dL Final  . Glucose-Capillary 10/10/2013 121* 70 - 99 mg/dL Final  . Glucose-Capillary 10/10/2013 114* 70 - 99 mg/dL Final  Hospital Outpatient Visit on 10/02/2013  Component Date Value Ref Range Status  . aPTT 10/02/2013 28  24 - 37 seconds Final  . WBC 10/02/2013 6.1  4.0 - 10.5 K/uL Final  . RBC 10/02/2013 3.91  3.87 - 5.11 MIL/uL Final  . Hemoglobin 10/02/2013 12.8  12.0 - 15.0 g/dL Final  . HCT 10/02/2013 38.9  36.0 - 46.0 % Final  . MCV 10/02/2013 99.5  78.0 - 100.0 fL Final  . MCH 10/02/2013 32.7  26.0 - 34.0 pg Final    . MCHC 10/02/2013 32.9  30.0 - 36.0 g/dL Final  . RDW 10/02/2013 13.9  11.5 - 15.5 % Final  . Platelets 10/02/2013 267  150 - 400 K/uL Final  . Sodium 10/02/2013 141  137 - 147 mEq/L Final  . Potassium 10/02/2013 4.6  3.7 - 5.3 mEq/L Final  . Chloride 10/02/2013 104  96 - 112 mEq/L Final  . CO2 10/02/2013 26  19 - 32 mEq/L Final  . Glucose, Bld 10/02/2013 97  70 - 99 mg/dL Final  . BUN 10/02/2013 16  6 - 23 mg/dL Final  . Creatinine, Ser 10/02/2013 1.22* 0.50 - 1.10 mg/dL Final  . Calcium 10/02/2013 9.6  8.4 - 10.5 mg/dL Final  . Total Protein 10/02/2013 7.7  6.0 - 8.3 g/dL Final  . Albumin 10/02/2013 3.7  3.5 - 5.2 g/dL Final  . AST 10/02/2013 18  0 - 37 U/L Final  . ALT 10/02/2013 15  0 - 35 U/L Final  . Alkaline Phosphatase 10/02/2013 47  39 - 117 U/L Final  . Total Bilirubin 10/02/2013 0.3  0.3 - 1.2 mg/dL Final  . GFR calc non Af Amer 10/02/2013 48* >90 mL/min Final  . GFR calc Af Amer 10/02/2013 55* >90 mL/min Final   Comment: (NOTE)                          The eGFR has been calculated using the CKD EPI equation.                          This calculation has not been validated in all clinical situations.                          eGFR's persistently <90 mL/min signify possible Chronic Kidney                          Disease.  . Anion gap 10/02/2013 11  5 - 15 Final  . Prothrombin Time 10/02/2013 13.5  11.6 - 15.2 seconds Final  . INR 10/02/2013 1.03  0.00 - 1.49 Final  . Color, Urine 10/02/2013 YELLOW  YELLOW Final  . APPearance 10/02/2013 CLEAR  CLEAR Final  . Specific Gravity, Urine 10/02/2013 1.023  1.005 - 1.030 Final  . pH 10/02/2013 6.5  5.0 - 8.0 Final  . Glucose, UA 10/02/2013 >1000* NEGATIVE mg/dL Final  . Hgb urine dipstick 10/02/2013 NEGATIVE  NEGATIVE Final  . Bilirubin Urine 10/02/2013 NEGATIVE  NEGATIVE Final  . Ketones, ur 10/02/2013 NEGATIVE  NEGATIVE mg/dL Final  . Protein, ur 10/02/2013 NEGATIVE  NEGATIVE mg/dL Final  . Urobilinogen, UA 10/02/2013 0.2   0.0 - 1.0 mg/dL Final  . Nitrite 10/02/2013 NEGATIVE  NEGATIVE Final  . Leukocytes, UA 10/02/2013 NEGATIVE  NEGATIVE Final  . MRSA, PCR 10/02/2013 NEGATIVE  NEGATIVE Final   PERFORMED AT [facility]  . Staphylococcus aureus 10/02/2013 NEGATIVE  NEGATIVE Final   Comment:                                 The Xpert SA Assay (FDA                          approved for NASAL specimens                          in patients over 49 years of age),                          is one component of                          a comprehensive surveillance                          program.  Test performance has                          been validated by [employer] for patients greater                          than or equal to 79 year old.                          It is not intended                          to diagnose infection nor to                          guide or monitor treatment.                          Performed at [facility]  . ABO/RH(D) 10/02/2013 A POS   Final  . Antibody Screen 10/02/2013 NEG   Final  . Sample Expiration 10/02/2013 10/12/2013   Final  . Squamous Epithelial / LPF 10/02/2013 RARE  RARE Final  . WBC, UA 10/02/2013 0-2  <3 WBC/hpf Final  . Bacteria, UA 10/02/2013 FEW* RARE Final     X-Rays:Dg Hip Complete Left  10/02/2013   CLINICAL DATA:  Preop evaluation for left hip replacement.  EXAM: LEFT HIP - COMPLETE 2+ VIEW  COMPARISON:  03/07/2013.  FINDINGS: Frontal pelvis shows the patient to be status post right total hip replacement, incompletely visualized. SI joints and symphysis pubis are unremarkable. Degenerative spurring is noted in the left femoral head and along the inferior aspect the acetabulum.  IMPRESSION: Degenerative changes in the left hip.   Electronically Signed   By: Misty Stanley M.D.   On: 10/02/2013 09:29   Dg Pelvis Portable  10/09/2013   CLINICAL DATA:  Left hip replacement.  EXAM: DG C-ARM 1-60 MIN - NRPT [facility]; PORTABLE  PELVIS 1-2 VIEWS  COMPARISON:  Plain films left hip 10/02/2013.  FINDINGS: The patient has a new left total hip arthroplasty. The device is located and no fracture is identified. Gas in the soft tissues from surgery and a surgical drain are noted. Right hip replacement is again seen as on the prior examination.  IMPRESSION: New total left hip replacement without complicating feature. No acute abnormality.   Electronically Signed   By: Inge Rise M.D.   On: 10/09/2013 11:03   Dg Foot 2 Views Left  09/13/2013   2 views of the left heel demonstrates plantar distally oriented calcaneal  heel spur however there does appear to be an interruption of the plantar  fascia just distal to the insertion site. No calcaneal fractures noted.  Dg C-arm 1-60 Min-no Report  10/09/2013   CLINICAL DATA:  Left hip replacement.  EXAM: DG C-ARM 1-60 MIN - NRPT [facility]; PORTABLE PELVIS 1-2 VIEWS  COMPARISON:  Plain films left hip 10/02/2013.  FINDINGS: The patient has a new left total hip arthroplasty. The device is located and no fracture is identified. Gas in the soft tissues from surgery and a surgical drain are noted. Right hip replacement is again seen as on the prior examination.  IMPRESSION: New total left hip replacement without complicating feature. No acute abnormality.   Electronically Signed   By: Inge Rise M.D.   On: 10/09/2013 11:03    EKG: Orders placed in visit on 03/14/13  . [place] [facility] Course: Patient was admitted to [facility] and taken to the OR and underwent the above state procedure without complications.  Patient tolerated the procedure well and was later transferred to the recovery room and then to the orthopaedic floor for postoperative care.  They were given PO and IV analgesics for pain control following their surgery.  They were given 24 hours of postoperative antibiotics of  Anti-infectives   Start     Dose/Rate Route Frequency Ordered Stop   10/09/13  1500  ceFAZolin (ANCEF) IVPB 2 g/50 mL premix     2 g 100 mL/hr over 30 Minutes Intravenous Every 6 hours 10/09/13 1147 10/09/13 2034   10/09/13 1200  valACYclovir (VALTREX) tablet 1,000 mg  Status:  Discontinued     1,000 mg Oral Daily 10/09/13 1147 10/10/13 1840   10/09/13 0614  ceFAZolin (ANCEF) IVPB 2 g/50 mL premix     2 g 100 mL/hr over 30 Minutes Intravenous On call to O.R. 10/09/13 [id number] 10/09/13 0826     and started on DVT prophylaxis in the form of Xarelto.   PT and OT were ordered for total  hip protocol.  The patient was allowed to be WBAT with therapy. Discharge planning was consulted to help with postop disposition and equipment needs.  Patient had a good night on the evening of surgery.  They started to get up OOB with therapy on day one.  Hemovac drain was pulled without difficulty.  Patient was seen in rounds and wouldsee how she did that day with therapy. Did well and mets all goals, then home later that same day after two sessions of therapy.   DVT Prophylaxis - Xarelto  Weight Bearing As Tolerated left Leg  Hemovac Pulled  Begin Therapy  Discharge home today.  DVT - Xarelto  Follow up in two weeks  Disposition - home Activity - WBAT, HHPT        Discharge Instructions   Call MD / Call 911    Complete by:  As directed   If you experience chest pain or shortness of breath, CALL 911 and be transported to the hospital emergency room.  If you develope a fever above 101 F, pus (white drainage) or increased drainage or redness at the wound, or calf pain, call your surgeon's office.     Change dressing    Complete by:  As directed   You may change your dressing dressing daily with sterile 4 x 4 inch gauze dressing and paper tape.  Do not submerge the incision under water.     Constipation Prevention    Complete by:  As directed   Drink plenty of fluids.  Prune juice may be helpful.  You may use a stool softener, such as Colace (over the counter) 100 mg twice a day.  Use  MiraLax (over the counter) for constipation as needed.     Diet - low sodium heart healthy    Complete by:  As directed      Discharge instructions    Complete by:  As directed   Pick up stool softner and laxative for home. Do not submerge incision under water. May shower. Continue to use ice for pain and swelling from surgery.  Total Hip Protocol.  Take Xarelto for two and a half more weeks, then discontinue Xarelto. Once the patient has completed the Xarelto, they may resume the 81 mg Aspirin.     Do not sit on low chairs, stoools or toilet seats, as it may be difficult to get up from low surfaces    Complete by:  As directed      Driving restrictions    Complete by:  As directed   No driving until released by the physician.     Increase activity slowly as tolerated    Complete by:  As directed      Lifting restrictions    Complete by:  As directed   No lifting until released by the physician.     Patient may shower    Complete by:  As directed   You may shower without a dressing once there is no drainage.  Do not wash over the wound.  If drainage remains, do not shower until drainage stops.     TED hose    Complete by:  As directed   Use stockings (TED hose) for 3 weeks on both leg(s).  You may remove them at night for sleeping.     Weight bearing as tolerated    Complete by:  As directed             Medication List    STOP  taking these medications       aspirin EC 81 MG tablet     CALCIUM + D PO     diclofenac sodium 1 % Gel  Commonly known as:  VOLTAREN     HYDROcodone-acetaminophen 5-325 MG per tablet  Commonly known as:  NORCO/VICODIN     predniSONE 5 MG Tabs tablet  Commonly known as:  STERAPRED UNI-PAK     TRULICITY 2.09 OB/0.9GG Sopn  Generic drug:  Dulaglutide     Vitamin D 1000 UNITS capsule      TAKE these medications       AEROCHAMBER MINI CHAMBER Devi  Use as directed     albuterol 108 (90 BASE) MCG/ACT inhaler  Commonly known as:   PROVENTIL HFA;VENTOLIN HFA  Inhale 2 puffs into the lungs as needed for wheezing or shortness of breath (every 4 hours as needed for cough, wheezing and chest tightness).     amLODipine 5 MG tablet  Commonly known as:  NORVASC  Take 5 mg by mouth every morning.     ARIPiprazole 5 MG tablet  Commonly known as:  ABILIFY  Take 2.5 mg by mouth every evening.     beclomethasone 80 MCG/ACT inhaler  Commonly known as:  QVAR  Inhale 2 puffs into the lungs daily.     buPROPion 150 MG 24 hr tablet  Commonly known as:  WELLBUTRIN XL  Take 450 mg by mouth every morning.     dextromethorphan-guaiFENesin 30-600 MG per 12 hr tablet  Commonly known as:  MUCINEX DM  Take 1 tablet by mouth 2 (two) times daily as needed for cough.     DULoxetine 60 MG capsule  Commonly known as:  CYMBALTA  Take 60 mg by mouth 2 (two) times daily.     INVOKAMET [phone] MG Tabs  Generic drug:  Canagliflozin-Metformin HCl  Take 1 tablet by mouth every morning.     KLONOPIN PO  Take 0.5 mg by mouth 2 (two) times daily. For anxiety     loratadine-pseudoephedrine 5-120 MG per tablet  Commonly known as:  CLARITIN-D 12-hour  Take 1 tablet by mouth daily as needed for allergies.     methocarbamol 500 MG tablet  Commonly known as:  ROBAXIN  Take 1 tablet (500 mg total) by mouth every 6 (six) hours as needed for muscle spasms.     NEURONTIN 300 MG capsule  Generic drug:  gabapentin  Take 300 mg by mouth at bedtime.     oxyCODONE 5 MG immediate release tablet  Commonly known as:  Oxy IR/ROXICODONE  Take 1-2 tablets (5-10 mg total) by mouth every 3 (three) hours as needed for moderate pain, severe pain or breakthrough pain.     pantoprazole 40 MG tablet  Commonly known as:  PROTONIX  Take 40 mg by mouth 2 (two) times daily.     REQUIP PO  Take 2 mg by mouth 2 (two) times daily.     rivaroxaban 10 MG Tabs tablet  Commonly known as:  XARELTO  - Take 1 tablet (10 mg total) by mouth daily with breakfast. Take  Xarelto for two and a half more weeks, then discontinue Xarelto.  - Once the patient has completed the Xarelto, they may resume the 81 mg Aspirin.     topiramate 25 MG tablet  Commonly known as:  TOPAMAX  Take 25 mg by mouth 2 (two) times daily.     valACYclovir 1000 MG tablet  Commonly known as:  VALTREX  Take 1,000 mg by mouth daily.     valsartan-hydrochlorothiazide 160-12.5 MG per tablet  Commonly known as:  DIOVAN-HCT  Take 0.5 tablets by mouth every Monday, Wednesday, and Friday at 6 PM. 1/2 tab daily on mon, wed, fri - 0 tabs other days     zolpidem 5 MG tablet  Commonly known as:  AMBIEN  Take 5 mg by mouth at bedtime.       Follow-up Information   Follow up with [name], MD. Schedule an appointment as soon as possible for a visit in 2 weeks.   Specialty:  Orthopedic Surgery   Contact information:   [address] [place] [address] [phone]       Signed: Arlee Muslim, PA-C Orthopaedic Surgery 10/11/2013, 8:33 AM

## 2013-10-10 NOTE — Evaluation (Signed)
Occupational Therapy Evaluation Patient Details Name: Deanna Rowe MRN: [id number] DOB: 08-Jan-1956 Today's Date: 10/10/2013    History of Present Illness L THR    Clinical Impression   This 58 year old female is s/p L DA THA.  She has a h/o R posterior approach.  Reviewed all education, emphasizing working within pain tolerance.  Pain does limit her adls and she will benefit from AE.  She return demonstrates/verbalizes understanding of all.  No further OT is needed at this time.    Follow Up Recommendations  No OT follow up    Equipment Recommendations  None recommended by OT    Recommendations for Other Services       Precautions / Restrictions Precautions Precautions: Fall Restrictions Weight Bearing Restrictions: No      Mobility Bed Mobility         Supine to sit: Min assist     General bed mobility comments: assist for LLE  Transfers       Sit to Stand: Min guard         General transfer comment: cues for LE management and use of UEs to self assist    Balance                                            ADL Overall ADL's : Needs assistance/impaired     Grooming: Wash/dry hands;Wash/dry face;Sitting   Upper Body Bathing: Set up;Sitting   Lower Body Bathing: Sit to/from stand;Minimal assistance (with AE)   Upper Body Dressing : Set up;Sitting   Lower Body Dressing: Sit to/from stand;Minimal assistance (with AE)   Toilet Transfer: Min guard;BSC;Ambulation   Toileting- Clothing Manipulation and Hygiene: Min guard;Sit to/from stand   Tub/ Shower Transfer: Min guard;Walk-in shower;Ambulation     General ADL Comments: ambulated to bathroom, practiced transfers and completed ADL from [facility], sit to stand.  Reviewed AE and pt used sock aide.  Pt limited by pain.  She does not have AE any more but plans to get reacher and sock aide     Vision                     Perception     Praxis      Pertinent Vitals/Pain  Pain minimal sitting; increases with weightbearing.  Repositioned.  Had just removed ice     Hand Dominance Right   Extremity/Trunk Assessment Upper Extremity Assessment Upper Extremity Assessment: Overall WFL for tasks assessed           Communication Communication Communication: No difficulties   Cognition Arousal/Alertness: Awake/alert Behavior During Therapy: WFL for tasks assessed/performed Overall Cognitive Status: Within Functional Limits for tasks assessed                     General Comments       Exercises       Shoulder Instructions      Home Living Family/patient expects to be discharged to:: Private residence Living Arrangements: Spouse/significant other Available Help at Discharge: Family Type of Home: House Home Access: Stairs to enter [employer] of Steps: 3 Entrance Stairs-Rails: None Home Layout: Two level;Able to live on main level with bedroom/bathroom     Bathroom Shower/Tub: Occupational psychologist: Standard     Home Equipment: Bedside commode  Prior Functioning/Environment Level of Independence: Independent with assistive device(s)        Comments: using cane    OT Diagnosis:     OT Problem List:     OT Treatment/Interventions:      OT Goals(Current goals can be found in the care plan section)    OT Frequency:     Barriers to D/C:            Co-evaluation              End of Session    Activity Tolerance: Patient tolerated treatment well Patient left: in chair;with call bell/phone within reach   Time: 0842-0910 OT Time Calculation (min): 28 min Charges:  OT General Charges $OT Visit: 1 Procedure OT Evaluation $Initial OT Evaluation Tier I: 1 Procedure OT Treatments $Self Care/Home Management : 23-37 mins G-Codes:    Delois Tolbert 11-03-2013, 9:25 AM Lesle Chris, OTR/L [phone] 2013-11-03

## 2013-10-10 NOTE — Progress Notes (Signed)
Physical Therapy Treatment Patient Details Name: Deanna Rowe MRN: [id number] DOB: 08/14/1955 Today's Date: October 12, 2013    History of Present Illness L THR     PT Comments    Pt progressing well.  Reviewed stairs and car transfers with pt and dtr.  Follow Up Recommendations  Home health PT     Equipment Recommendations  None recommended by PT    Recommendations for Other Services OT consult     Precautions / Restrictions Precautions Precautions: Fall Restrictions Weight Bearing Restrictions: No Other Position/Activity Restrictions: WBAT    Mobility  Bed Mobility Overal bed mobility: Needs Assistance Bed Mobility: Sit to Supine       Sit to supine: Min guard   General bed mobility comments: Pt self assisting L LE with R LE - cues for sequencing  Transfers Overall transfer level: Needs assistance Equipment used: Rolling walker (2 wheeled) Transfers: Sit to/from Stand Sit to Stand: Min guard;Supervision         General transfer comment: cues for LE management and use of UEs to self assist  Ambulation/Gait Ambulation/Gait assistance: Min guard;Supervision Ambulation Distance (Feet): 50 Feet Assistive device: Rolling walker (2 wheeled) Gait Pattern/deviations: Step-to pattern;Decreased step length - right;Decreased step length - left;Shuffle;Trunk flexed Gait velocity: decr   General Gait Details: cues for posture, position from RW, stride length and sequence   Stairs Stairs: Yes Stairs assistance: Min assist Stair Management: No rails;Step to pattern;Forwards;With walker Number of Stairs: 4 General stair comments: cues for sequence and foot/RW placement,  Dtr present and observing  Wheelchair Mobility    Modified Rankin (Stroke Patients Only)       Balance                                    Cognition Arousal/Alertness: Awake/alert Behavior During Therapy: WFL for tasks assessed/performed Overall Cognitive Status: Within  Functional Limits for tasks assessed                      Exercises Total Joint Exercises Ankle Circles/Pumps: AROM;Both;15 reps;Supine Quad Sets: AROM;Both;10 reps;Supine Heel Slides: AAROM;Left;Supine;20 reps Hip ABduction/ADduction: AAROM;Left;Supine;15 reps    General Comments        Pertinent Vitals/Pain 5/10; premed, ice packs provided    Home Living                      Prior Function            PT Goals (current goals can now be found in the care plan section) Acute Rehab PT Goals Patient Stated Goal: Resume previous lifestyle with decreased pain PT Goal Formulation: With patient Time For Goal Achievement: 10/16/13 Potential to Achieve Goals: Good Progress towards PT goals: Progressing toward goals    Frequency  7X/week    PT Plan Current plan remains appropriate    Co-evaluation             End of Session Equipment Utilized During Treatment: Gait belt Activity Tolerance: Patient tolerated treatment well Patient left: in bed;with family/visitor present;with call bell/phone within reach     Time: 9741-6384 PT Time Calculation (min): 30 min  Charges:  $Gait Training: 8-22 mins $Therapeutic Exercise: 8-22 mins                    G Codes:      Makenzey Nanni October 12, 2013, 3:24 PM

## 2013-10-10 NOTE — Care Management Note (Signed)
    Page 1 of 1   10/10/2013     11:11:40 AM CARE MANAGEMENT NOTE 10/10/2013  Patient:  Deanna Rowe, Deanna Rowe   Account Number:  [id number]  Date Initiated:  10/10/2013  Documentation initiated by:  Sunday Spillers  Subjective/Objective Assessment:   58 yo female admitted s/p Left total hip arthroplasty, anterior approach. PTA lived at home with spouse.     Action/Plan:   Home when stable   Anticipated DC Date:  10/11/2013   Anticipated DC Plan:  [place]  CM consult      Choice offered to / List presented to:  C-1 Patient        [place] arranged  HH-2 PT      [place]   Status of service:  Completed, signed off Medicare Important Message given?  NA - LOS <3 / Initial given by admissions (If response is "NO", the following Medicare IM given date fields will be blank) Date Medicare IM given:   Medicare IM given by:   Date Additional Medicare IM given:   Additional Medicare IM given by:    Discharge Disposition:  [place]  Per UR Regulation:  Reviewed for med. necessity/level of care/duration of stay  If discussed at [place] of Stay Meetings, dates discussed:    Comments:

## 2013-10-10 NOTE — Discharge Instructions (Addendum)
°Dr. Frank Aluisio °Total Joint Specialist °[facility] °[address] °, [place] [address] °[phone] ° ° ° °ANTERIOR APPROACH TOTAL HIP REPLACEMENT POSTOPERATIVE DIRECTIONS ° ° °Hip Rehabilitation, Guidelines Following Surgery  °The results of a hip operation are greatly improved after range of motion and muscle strengthening exercises. Follow all safety measures which are given to protect your hip. If any of these exercises cause increased pain or swelling in your joint, decrease the amount until you are comfortable again. Then slowly increase the exercises. Call your caregiver if you have problems or questions.  °HOME CARE INSTRUCTIONS  °Most of the following instructions are designed to prevent the dislocation of your new hip.  °Remove items at home which could result in a fall. This includes throw rugs or furniture in walking pathways.  °Continue medications as instructed at time of discharge. °· You may have some home medications which will be placed on hold until you complete the course of blood thinner medication. °· You may start showering once you are discharged home but do not submerge the incision under water. Just pat the incision dry and apply a dry gauze dressing on daily. °Do not put on socks or shoes without following the instructions of your caregivers.  °Sit on high chairs which makes it easier to stand.  °Sit on chairs with arms. Use the chair arms to help push yourself up when arising.  °Keep your leg on the side of the operation out in front of you when standing up.  °Arrange for the use of a toilet seat elevator so you are not sitting low.   °· Walk with walker as instructed.  °You may resume a sexual relationship in one month or when given the OK by your caregiver.  °Use walker as long as suggested by your caregivers.  °You may put full weight on your legs and walk as much as is comfortable. °Avoid periods of inactivity such as sitting longer than an hour  when not asleep. This helps prevent blood clots.  °You may return to work once you are cleared by your surgeon.  °Do not drive a car for 6 weeks or until released by your surgeon.  °Do not drive while taking narcotics.  °Wear elastic stockings for three weeks following surgery during the day but you may remove then at night.  °Make sure you keep all of your appointments after your operation with all of your doctors and caregivers. You should call the office at the above phone number and make an appointment for approximately two weeks after the date of your surgery. °Change the dressing daily and reapply a dry dressing each time. °Please pick up a stool softener and laxative for home use as long as you are requiring pain medications. °· Continue to use ice on the hip for pain and swelling from surgery. You may notice swelling that will progress down to the foot and ankle.  This is normal after  surgery.  Elevate the leg when you are not up walking on it.   °It is important for you to complete the blood thinner medication as prescribed by your doctor. °· Continue to use the breathing machine which will help keep your temperature down.  It is common for your temperature to cycle up and down following surgery, especially at night when you are not up moving around and exerting yourself.  The breathing machine keeps your lungs expanded and your temperature down. ° °RANGE OF MOTION AND STRENGTHENING EXERCISES  °  These exercises are designed to help you keep full movement of your hip joint. Follow your caregiver's or physical therapist's instructions. Perform all exercises about fifteen times, three times per day or as directed. Exercise both hips, even if you have had only one joint replacement. These exercises can be done on a training (exercise) mat, on the floor, on a table or on a bed. Use whatever works the best and is most comfortable for you. Use music or television while you are exercising so that the exercises are  a pleasant break in your day. This will make your life better with the exercises acting as a break in routine you can look forward to.  Lying on your back, slowly slide your foot toward your buttocks, raising your knee up off the floor. Then slowly slide your foot back down until your leg is straight again.  Lying on your back spread your legs as far apart as you can without causing discomfort.  Lying on your side, raise your upper leg and foot straight up from the floor as far as is comfortable. Slowly lower the leg and repeat.  Lying on your back, tighten up the muscle in the front of your thigh (quadriceps muscles). You can do this by keeping your leg straight and trying to raise your heel off the floor. This helps strengthen the largest muscle supporting your knee.  Lying on your back, tighten up the muscles of your buttocks both with the legs straight and with the knee bent at a comfortable angle while keeping your heel on the floor.   SKILLED REHAB INSTRUCTIONS: If the patient is transferred to a skilled rehab facility following release from the hospital, a list of the current medications will be sent to the facility for the patient to continue.  When discharged from the skilled rehab facility, please have the facility set up the patient's [place] prior to being released. Also, the skilled facility will be responsible for providing the patient with their medications at time of release from the facility to include their pain medication, the muscle relaxants, and their blood thinner medication. If the patient is still at the rehab facility at time of the two week follow up appointment, the skilled rehab facility will also need to assist the patient in arranging follow up appointment in our office and any transportation needs.  MAKE SURE YOU:  Understand these instructions.  Will watch your condition.  Will get help right away if you are not doing well or get worse.  Pick up  stool softner and laxative for home. Do not submerge incision under water. May shower. Continue to use ice for pain and swelling from surgery. Total Hip Protocol.  Take Xarelto for two and a half more weeks, then discontinue Xarelto. Once the patient has completed the Xarelto, they may resume the 81 mg Aspirin.  ==== Information on my medicine - XARELTO (Rivaroxaban)  This medication education was reviewed with me or my healthcare representative as part of my discharge preparation.  The pharmacist that spoke with me during my hospital stay was:  Mahlet Jergens, Julieta Bellini, RPH  Why was Xarelto prescribed for you? Xarelto was prescribed for you to reduce the risk of blood clots forming after orthopedic surgery. The medical term for these abnormal blood clots is venous thromboembolism (VTE).  What do you need to know about xarelto ? Take your Xarelto ONCE DAILY at the same time every day. You may take it either with or without  food.  If you have difficulty swallowing the tablet whole, you may crush it and mix in applesauce just prior to taking your dose.  Take Xarelto exactly as prescribed by your doctor and DO NOT stop taking Xarelto without talking to the doctor who prescribed the medication.  Stopping without other VTE prevention medication to take the place of Xarelto may increase your risk of developing a clot.  After discharge, you should have regular check-up appointments with your healthcare provider that is prescribing your Xarelto.    What do you do if you miss a dose? If you miss a dose, take it as soon as you remember on the same day then continue your regularly scheduled once daily regimen the next day. Do not take two doses of Xarelto on the same day.   Important Safety Information A possible side effect of Xarelto is bleeding. You should call your healthcare provider right away if you experience any of the following:   Bleeding from an injury or your nose that does not  stop.   Unusual colored urine (red or dark brown) or unusual colored stools (red or black).   Unusual bruising for unknown reasons.   A serious fall or if you hit your head (even if there is no bleeding).  Some medicines may interact with Xarelto and might increase your risk of bleeding while on Xarelto. To help avoid this, consult your healthcare provider or pharmacist prior to using any new prescription or non-prescription medications, including herbals, vitamins, non-steroidal anti-inflammatory drugs (NSAIDs) and supplements.  This website has more information on Xarelto: [email or web address]

## 2013-10-10 NOTE — Progress Notes (Signed)
   Subjective: 1 Day Post-Op Procedure(s) (LRB): LEFT TOTAL HIP ARTHROPLASTY ANTERIOR APPROACH (Left) Patient reports pain as mild.   Patient seen in rounds with Dr. Wynelle Link. Patient is well, but has had some minor complaints of pain in the hip, requiring pain medications We will start therapy today.  Plan is to go Home after hospital stay. Will see how she does today.  If does well and meets all goals, then possible home later today after two sessions of therapy.  Objective: Vital signs in last 24 hours: Temp:  [97.4 F (36.3 C)-98.6 F (37 C)] 97.8 F (36.6 C) (07/09 0622) Pulse Rate:  [80-116] 81 (07/09 0622) Resp:  [11-18] 16 (07/09 0622) BP: (98-135)/(62-87) 110/73 mmHg (07/09 0622) SpO2:  [92 %-100 %] 99 % (07/09 0622)  Intake/Output from previous day:  Intake/Output Summary (Last 24 hours) at 10/10/13 0726 Last data filed at 10/10/13 7867  Gross per 24 hour  Intake 4448.75 ml  Output   4015 ml  Net 433.75 ml    Labs:  Recent Labs  10/10/13 0445  HGB 10.7*    Recent Labs  10/10/13 0445  WBC 10.0  RBC 3.31*  HCT 33.5*  PLT 229    Recent Labs  10/10/13 0445  NA 138  K 4.6  CL 103  CO2 26  BUN 15  CREATININE 1.03  GLUCOSE 118*  CALCIUM 9.0   No results found for this basename: LABPT, INR,  in the last 72 hours  EXAM General - Patient is Alert, Appropriate and Oriented Extremity - Neurovascular intact Sensation intact distally Dressing - dressing C/D/I Motor Function - intact, moving foot and toes well on exam.  Hemovac pulled without difficulty.  Past Medical History  Diagnosis Date  . Diabetes mellitus   . Hypertension   . Obesity   . Anxiety   . Depression   . Diabetes mellitus, type II   . PONV (postoperative nausea and vomiting)   . Dry cough   . TIA (transient ischemic attack) 2012    no residual problems  . Arthritis     L HIP  . Avascular necrosis of hip     LEFT  . GERD (gastroesophageal reflux disease)   . Urgency  incontinence   . Insomnia     takes Ambien nightly    Assessment/Plan: 1 Day Post-Op Procedure(s) (LRB): LEFT TOTAL HIP ARTHROPLASTY ANTERIOR APPROACH (Left) Principal Problem:   Avascular necrosis of bone of left hip Active Problems:   OA (osteoarthritis) of hip   Postoperative anemia due to acute blood loss  Estimated body mass index is 33.35 kg/(m^2) as calculated from the following:   Height as of this encounter: 5\' 6"  (1.676 m).   Weight as of this encounter: 93.668 kg (206 lb 8 oz). Advance diet Up with therapy Discharge home with home health Possible home today depending upon progress with therapy. Will setup discharge if does well and meets all goals.  DVT Prophylaxis - Xarelto Weight Bearing As Tolerated left Leg Hemovac Pulled Begin Therapy Possible home today. DVT - Xarelto Follow up in two weeks Disposition - home if does well Activity - WBAT, [place], PA-C Orthopaedic Surgery 10/10/2013, 7:26 AM

## 2013-10-17 DIAGNOSIS — Z471 Aftercare following joint replacement surgery: Secondary | ICD-10-CM | POA: Diagnosis not present

## 2013-10-17 DIAGNOSIS — Z96649 Presence of unspecified artificial hip joint: Secondary | ICD-10-CM | POA: Diagnosis not present

## 2013-10-24 NOTE — Progress Notes (Signed)
Patient ID: Deanna Rowe, female   DOB: 1956/02/25, 58 y.o.   MRN: [id number] ATTENDING PHYSICIAN NOTE: I have reviewed the chart and agree with the plan as detailed above. Dorcas Mcmurray MD Pager [phone]

## 2013-10-30 ENCOUNTER — Telehealth: Payer: Self-pay | Admitting: *Deleted

## 2013-10-30 NOTE — Telephone Encounter (Signed)
Patient called and stated she wants to cancel her appointment for tomorrow.  She stated that someone had told her that Dr. Milinda Pointer said it was okay.  I reviewed her encounters and saw on 09/26/2013 that Dr. Milinda Pointer said he would see her back after she recovers from her surgery.  She said well it may be a while, probably 3-4 months.  I told her okay.

## 2013-10-31 ENCOUNTER — Ambulatory Visit: Payer: Medicare Other | Admitting: Podiatry

## 2013-11-14 DIAGNOSIS — Z5189 Encounter for other specified aftercare: Secondary | ICD-10-CM | POA: Diagnosis not present

## 2013-11-19 DIAGNOSIS — F333 Major depressive disorder, recurrent, severe with psychotic symptoms: Secondary | ICD-10-CM | POA: Diagnosis not present

## 2013-11-21 ENCOUNTER — Ambulatory Visit: Payer: 59 | Attending: Orthopedic Surgery | Admitting: Physical Therapy

## 2013-11-21 DIAGNOSIS — Z96649 Presence of unspecified artificial hip joint: Secondary | ICD-10-CM | POA: Diagnosis not present

## 2013-11-21 DIAGNOSIS — M25559 Pain in unspecified hip: Secondary | ICD-10-CM | POA: Diagnosis not present

## 2013-11-21 DIAGNOSIS — E119 Type 2 diabetes mellitus without complications: Secondary | ICD-10-CM | POA: Diagnosis not present

## 2013-11-21 DIAGNOSIS — IMO0001 Reserved for inherently not codable concepts without codable children: Secondary | ICD-10-CM | POA: Insufficient documentation

## 2013-11-21 DIAGNOSIS — R269 Unspecified abnormalities of gait and mobility: Secondary | ICD-10-CM | POA: Insufficient documentation

## 2013-11-21 DIAGNOSIS — I10 Essential (primary) hypertension: Secondary | ICD-10-CM | POA: Insufficient documentation

## 2013-11-21 DIAGNOSIS — M25569 Pain in unspecified knee: Secondary | ICD-10-CM | POA: Diagnosis not present

## 2013-11-26 ENCOUNTER — Ambulatory Visit: Payer: 59 | Admitting: Rehabilitation

## 2013-11-26 DIAGNOSIS — IMO0001 Reserved for inherently not codable concepts without codable children: Secondary | ICD-10-CM | POA: Diagnosis not present

## 2013-11-27 ENCOUNTER — Ambulatory Visit: Payer: 59 | Admitting: Rehabilitation

## 2013-11-27 DIAGNOSIS — IMO0001 Reserved for inherently not codable concepts without codable children: Secondary | ICD-10-CM | POA: Diagnosis not present

## 2013-11-28 ENCOUNTER — Ambulatory Visit (INDEPENDENT_AMBULATORY_CARE_PROVIDER_SITE_OTHER): Payer: Self-pay | Admitting: Family Medicine

## 2013-11-28 VITALS — BP 130/85 | HR 75 | Ht 66.0 in | Wt 189.4 lb

## 2013-11-28 DIAGNOSIS — E119 Type 2 diabetes mellitus without complications: Secondary | ICD-10-CM

## 2013-11-28 NOTE — Progress Notes (Signed)
Patient presents today for DM follow-up as part of employer-sponsored Link to [employer]. Medications, glucose readings, a1c and compliance have been reviewed. I have also discussed with patient lifestyle interventions including diet and exercise. Details of the visit can be found in [employer] documenting program through [facility]). Patient has set a series of personal goals and will follow-up in no more than 3 months for further review of DM.   Today's a1c: 6.0%, weight 189.4 (with large heavy cast). 1 hypoglycemia in previous 3 months.

## 2013-11-29 ENCOUNTER — Ambulatory Visit: Payer: 59 | Admitting: Rehabilitation

## 2013-11-29 DIAGNOSIS — IMO0001 Reserved for inherently not codable concepts without codable children: Secondary | ICD-10-CM | POA: Diagnosis not present

## 2013-12-02 ENCOUNTER — Ambulatory Visit: Payer: 59 | Admitting: Rehabilitation

## 2013-12-02 DIAGNOSIS — IMO0001 Reserved for inherently not codable concepts without codable children: Secondary | ICD-10-CM | POA: Diagnosis not present

## 2013-12-03 ENCOUNTER — Other Ambulatory Visit (HOSPITAL_COMMUNITY): Payer: Self-pay | Admitting: Orthopedic Surgery

## 2013-12-03 DIAGNOSIS — M25562 Pain in left knee: Secondary | ICD-10-CM

## 2013-12-04 ENCOUNTER — Ambulatory Visit: Payer: 59 | Attending: Orthopedic Surgery | Admitting: Rehabilitation

## 2013-12-04 DIAGNOSIS — IMO0001 Reserved for inherently not codable concepts without codable children: Secondary | ICD-10-CM | POA: Insufficient documentation

## 2013-12-04 DIAGNOSIS — M25559 Pain in unspecified hip: Secondary | ICD-10-CM | POA: Insufficient documentation

## 2013-12-04 DIAGNOSIS — E119 Type 2 diabetes mellitus without complications: Secondary | ICD-10-CM | POA: Insufficient documentation

## 2013-12-04 DIAGNOSIS — I10 Essential (primary) hypertension: Secondary | ICD-10-CM | POA: Insufficient documentation

## 2013-12-04 DIAGNOSIS — M25569 Pain in unspecified knee: Secondary | ICD-10-CM | POA: Insufficient documentation

## 2013-12-04 DIAGNOSIS — Z96649 Presence of unspecified artificial hip joint: Secondary | ICD-10-CM | POA: Insufficient documentation

## 2013-12-04 DIAGNOSIS — R269 Unspecified abnormalities of gait and mobility: Secondary | ICD-10-CM | POA: Diagnosis not present

## 2013-12-06 ENCOUNTER — Ambulatory Visit: Payer: 59 | Admitting: Rehabilitation

## 2013-12-06 DIAGNOSIS — IMO0001 Reserved for inherently not codable concepts without codable children: Secondary | ICD-10-CM | POA: Diagnosis not present

## 2013-12-10 ENCOUNTER — Encounter: Payer: Self-pay | Admitting: Physical Therapy

## 2013-12-11 ENCOUNTER — Ambulatory Visit: Payer: 59 | Admitting: Physical Therapy

## 2013-12-11 DIAGNOSIS — IMO0001 Reserved for inherently not codable concepts without codable children: Secondary | ICD-10-CM | POA: Diagnosis not present

## 2013-12-12 ENCOUNTER — Ambulatory Visit (HOSPITAL_COMMUNITY): Payer: 59

## 2013-12-13 ENCOUNTER — Ambulatory Visit: Payer: 59 | Admitting: Rehabilitation

## 2013-12-13 DIAGNOSIS — IMO0001 Reserved for inherently not codable concepts without codable children: Secondary | ICD-10-CM | POA: Diagnosis not present

## 2013-12-16 ENCOUNTER — Ambulatory Visit: Payer: 59 | Admitting: Physical Therapy

## 2013-12-16 ENCOUNTER — Ambulatory Visit (HOSPITAL_COMMUNITY)
Admission: RE | Admit: 2013-12-16 | Discharge: 2013-12-16 | Disposition: A | Discharge status: Home / Self Care | Payer: 59 | Source: Ambulatory Visit | Attending: Orthopedic Surgery | Admitting: Orthopedic Surgery

## 2013-12-16 DIAGNOSIS — R29898 Other symptoms and signs involving the musculoskeletal system: Secondary | ICD-10-CM | POA: Diagnosis not present

## 2013-12-16 DIAGNOSIS — Z9889 Other specified postprocedural states: Secondary | ICD-10-CM | POA: Insufficient documentation

## 2013-12-16 DIAGNOSIS — M25569 Pain in unspecified knee: Secondary | ICD-10-CM | POA: Diagnosis not present

## 2013-12-16 DIAGNOSIS — IMO0001 Reserved for inherently not codable concepts without codable children: Secondary | ICD-10-CM | POA: Diagnosis not present

## 2013-12-16 DIAGNOSIS — M25562 Pain in left knee: Secondary | ICD-10-CM

## 2013-12-18 ENCOUNTER — Ambulatory Visit: Payer: 59 | Admitting: Rehabilitation

## 2013-12-18 DIAGNOSIS — IMO0001 Reserved for inherently not codable concepts without codable children: Secondary | ICD-10-CM | POA: Diagnosis not present

## 2013-12-24 ENCOUNTER — Encounter: Payer: Self-pay | Admitting: Family Medicine

## 2013-12-24 NOTE — Progress Notes (Signed)
Patient ID: Deanna Rowe, female   DOB: 12/18/1955, 58 y.o.   MRN: [id number] Reviewed: Agree with the documentation and management of our [facility] pharmacologist.

## 2013-12-25 DIAGNOSIS — M25569 Pain in unspecified knee: Secondary | ICD-10-CM | POA: Diagnosis not present

## 2013-12-25 DIAGNOSIS — Z96649 Presence of unspecified artificial hip joint: Secondary | ICD-10-CM | POA: Diagnosis not present

## 2014-01-15 ENCOUNTER — Other Ambulatory Visit: Payer: Self-pay

## 2014-01-15 DIAGNOSIS — Z1239 Encounter for other screening for malignant neoplasm of breast: Secondary | ICD-10-CM

## 2014-01-16 DIAGNOSIS — Z96642 Presence of left artificial hip joint: Secondary | ICD-10-CM | POA: Diagnosis not present

## 2014-01-16 DIAGNOSIS — Z471 Aftercare following joint replacement surgery: Secondary | ICD-10-CM | POA: Diagnosis not present

## 2014-01-16 DIAGNOSIS — M1712 Unilateral primary osteoarthritis, left knee: Secondary | ICD-10-CM | POA: Diagnosis not present

## 2014-01-23 ENCOUNTER — Other Ambulatory Visit: Payer: Self-pay

## 2014-01-23 DIAGNOSIS — Z1231 Encounter for screening mammogram for malignant neoplasm of breast: Secondary | ICD-10-CM

## 2014-01-29 ENCOUNTER — Ambulatory Visit
Admission: RE | Admit: 2014-01-29 | Discharge: 2014-01-29 | Disposition: A | Discharge status: Home / Self Care | Payer: 59 | Source: Ambulatory Visit

## 2014-01-29 DIAGNOSIS — Z1231 Encounter for screening mammogram for malignant neoplasm of breast: Secondary | ICD-10-CM

## 2014-02-06 DIAGNOSIS — M1712 Unilateral primary osteoarthritis, left knee: Secondary | ICD-10-CM | POA: Diagnosis not present

## 2014-02-18 ENCOUNTER — Other Ambulatory Visit: Payer: Self-pay | Admitting: Internal Medicine

## 2014-02-18 DIAGNOSIS — R1032 Left lower quadrant pain: Secondary | ICD-10-CM

## 2014-02-21 DIAGNOSIS — M1712 Unilateral primary osteoarthritis, left knee: Secondary | ICD-10-CM | POA: Diagnosis not present

## 2014-02-24 ENCOUNTER — Ambulatory Visit
Admission: RE | Admit: 2014-02-24 | Discharge: 2014-02-24 | Disposition: A | Discharge status: Home / Self Care | Payer: 59 | Source: Ambulatory Visit | Attending: Internal Medicine | Admitting: Internal Medicine

## 2014-02-24 DIAGNOSIS — R1032 Left lower quadrant pain: Secondary | ICD-10-CM | POA: Diagnosis not present

## 2014-02-24 MED ORDER — IOHEXOL 300 MG/ML  SOLN
125.0000 mL | Freq: Once | INTRAMUSCULAR | Status: AC | PRN
  Administered 2014-02-24: 125 mL via INTRAVENOUS

## 2014-04-22 ENCOUNTER — Other Ambulatory Visit: Payer: Self-pay

## 2014-04-22 DIAGNOSIS — I739 Peripheral vascular disease, unspecified: Secondary | ICD-10-CM

## 2014-04-23 ENCOUNTER — Encounter: Payer: Self-pay | Admitting: Vascular Surgery

## 2014-04-24 ENCOUNTER — Ambulatory Visit (INDEPENDENT_AMBULATORY_CARE_PROVIDER_SITE_OTHER): Payer: 59 | Admitting: Vascular Surgery

## 2014-04-24 ENCOUNTER — Ambulatory Visit (HOSPITAL_COMMUNITY)
Admission: RE | Admit: 2014-04-24 | Discharge: 2014-04-24 | Disposition: A | Discharge status: Home / Self Care | Payer: 59 | Source: Ambulatory Visit | Attending: Vascular Surgery | Admitting: Vascular Surgery

## 2014-04-24 ENCOUNTER — Encounter: Payer: Self-pay | Admitting: Vascular Surgery

## 2014-04-24 VITALS — BP 131/85 | HR 98 | Resp 16 | Ht 66.0 in | Wt 208.0 lb

## 2014-04-24 DIAGNOSIS — I739 Peripheral vascular disease, unspecified: Secondary | ICD-10-CM

## 2014-04-24 DIAGNOSIS — E1140 Type 2 diabetes mellitus with diabetic neuropathy, unspecified: Secondary | ICD-10-CM | POA: Insufficient documentation

## 2014-04-24 NOTE — Progress Notes (Signed)
Referred by:  Michel Harrow, PA-C [place], [place] [address]  Reason for referral: bilateral leg pain  History of Present Illness  Deanna Rowe is a 59 y.o. (01-15-1956) female who presents with chief complaint: bilateral leg pain.  Onset of symptom occurred a few years ago, and pain is described as sharp, severity 3-6/10, and associated with ambulation.  Pain is note to radiate down legs, R>L.  Patient also notes nocturnal cramping.  Patient has attempted to treat this pain with rest and OTC.  The patient has no rest pain symptoms also and no leg wounds/ulcers.  Atherosclerotic risk factors include: DM, HTN.  Past Medical History  Diagnosis Date  . Diabetes mellitus   . Hypertension   . Obesity   . Anxiety   . Depression   . Diabetes mellitus, type II   . PONV (postoperative nausea and vomiting)   . Dry cough   . TIA (transient ischemic attack) 2012    no residual problems  . Arthritis     L HIP  . Avascular necrosis of hip     LEFT  . GERD (gastroesophageal reflux disease)   . Urgency incontinence   . Insomnia     takes Ambien nightly    Past Surgical History  Procedure Laterality Date  . Dilation and curettage of uterus    . Esophageal manometry N/A 09/03/2012    Procedure: ESOPHAGEAL MANOMETRY (EM);  Surgeon: Garlan Fair, MD;  Location: [facility];  Service: Endoscopy;  Laterality: N/A;  . Joint replacement  2011    rt total hip  . Exploratory laparotomy    . Pilonidal cyst / sinus excision    . Total hip arthroplasty Left 10/09/2013    Procedure: LEFT TOTAL HIP ARTHROPLASTY ANTERIOR APPROACH;  Surgeon: [name], MD;  Location: [facility];  Service: Orthopedics;  Laterality: Left;    History   Social History  . Marital Status: Married    Spouse Name: N/A    Number of Children: N/A  . Years of Education: N/A   Occupational History  . Not on file.   Social History Main Topics  . Smoking status: Never Smoker   . Smokeless  tobacco: Never Used  . Alcohol Use: No  . Drug Use: No  . Sexual Activity: Not Currently   Other Topics Concern  . Not on file   Social History Narrative    Family History  Problem Relation Age of Onset  . Alcohol abuse Brother   . Alcohol abuse Brother   . Alcohol abuse Brother   . Alcohol abuse Brother     Current Outpatient Prescriptions on File Prior to Visit  Medication Sig Dispense Refill  . albuterol (PROVENTIL HFA;VENTOLIN HFA) 108 (90 BASE) MCG/ACT inhaler Inhale 2 puffs into the lungs as needed for wheezing or shortness of breath (every 4 hours as needed for cough, wheezing and chest tightness).    Marland Kitchen amLODipine (NORVASC) 5 MG tablet Take 5 mg by mouth every morning.     . ARIPiprazole (ABILIFY) 5 MG tablet Take 2.5 mg by mouth every evening.     Marland Kitchen buPROPion (WELLBUTRIN XL) 150 MG 24 hr tablet Take 450 mg by mouth every morning.     . ClonazePAM (KLONOPIN PO) Take 0.5 mg by mouth 2 (two) times daily. For anxiety    . DULoxetine (CYMBALTA) 60 MG capsule Take 60 mg by mouth 2 (two) times daily.     Marland Kitchen gabapentin (  NEURONTIN) 300 MG capsule Take 300 mg by mouth at bedtime.     . pantoprazole (PROTONIX) 40 MG tablet Take 40 mg by mouth 2 (two) times daily.     Marland Kitchen ROPINIRole HCl (REQUIP PO) Take 2 mg by mouth 2 (two) times daily.     Marland Kitchen topiramate (TOPAMAX) 25 MG tablet Take 25 mg by mouth 2 (two) times daily.    . valACYclovir (VALTREX) 1000 MG tablet Take 1,000 mg by mouth daily.    Marland Kitchen zolpidem (AMBIEN) 5 MG tablet Take 5 mg by mouth at bedtime.     . Canagliflozin-Metformin HCl (INVOKAMET) [phone] MG TABS Take 1 tablet by mouth every morning.      Current Facility-Administered Medications on File Prior to Visit  Medication Dose Route Frequency Provider Last Rate Last Dose  . tranexamic acid (CYKLOKAPRON) topical -INTRAOP  2,000 mg Topical Once Amber Lauren Cecilio Asper, PA-C        Allergies  Allergen Reactions  . Codeine Nausea And Vomiting  . Amoxicillin Rash    REVIEW  OF SYSTEMS:  (Positives checked otherwise negative)  CARDIOVASCULAR:  []  chest pain, []  chest pressure, []  palpitations, []  shortness of breath when laying flat, [x]  shortness of breath with exertion,  [x]  pain in feet when walking, [x]  pain in feet when laying flat, []  history of blood clot in veins (DVT), []  history of phlebitis, [x]  swelling in legs, []  varicose veins  PULMONARY:  []  productive cough, []  asthma, []  wheezing  NEUROLOGIC:  [x]  weakness in arms or legs, [x]  numbness in arms or legs, []  difficulty speaking or slurred speech, []  temporary loss of vision in one eye, [x]  dizziness  HEMATOLOGIC:  []  bleeding problems, []  problems with blood clotting too easily  MUSCULOSKEL:  []  joint pain, []  joint swelling  GASTROINTEST:  []  vomiting blood, []  blood in stool     GENITOURINARY:  []  burning with urination, []  blood in urine  PSYCHIATRIC:  [x]  history of major depression  INTEGUMENTARY:  []  rashes, []  ulcers  CONSTITUTIONAL:  []  fever, []  chills  For VQI Use Only  PRE-ADM LIVING: Home  AMB STATUS: Ambulatory  CAD Sx: None  PRIOR CHF: None  STRESS TEST: [x]  No, [ ]  Normal, [ ]  + ischemia, [ ]  + MI, [ ]  Both   Physical Examination  Filed Vitals:   04/24/14 1223  BP: 131/85  Pulse: 98  Resp: 16  Height: 5\' 6"  (1.676 m)  Weight: 208 lb (94.348 kg)   Body mass index is 33.59 kg/(m^2).  General: A&O x 3, WDWN  Head: [place]/AT  Ear/Nose/Throat: Hearing grossly intact, nares w/o erythema or drainage, oropharynx w/ Erythema w/o Exudate, Mallampati score: 3  Eyes: PERRLA, EOMI  Neck: Supple, no nuchal rigidity, no palpable LAD  Pulmonary: Sym exp, good air movt, CTAB, no rales, rhonchi, & wheezing  Cardiac: RRR, Nl S1, S2, no Murmurs, rubs or gallops  Vascular: Vessel Right Left  Radial Palpable Palpable  Ulnar Palpable Palpable  Brachial Palpable Palpable  Carotid Palpable, without bruit Palpable, without bruit  Aorta Not palpable N/A  Femoral Palpable  Palpable  Popliteal Not palpable Not palpable  PT Faintly Palpable Faintly Palpable  DP Faintly Palpable Faintly Palpable   Gastrointestinal: soft, NTND, -G/R, - HSM, - masses, - CVAT B  Musculoskeletal: M/S 5/5 throughout , Extremities without ischemic changes , trace edema BLE  Neurologic: CN 2-12 intact , Pain and light touch intact in extremities , Motor exam as listed above  Psychiatric: Judgment  intact, Mood & affect appropriate for pt's clinical situation  Dermatologic: See M/S exam for extremity exam, no rashes otherwise noted  Lymph : No Cervical, Axillary, or Inguinal lymphadenopathy   Non-Invasive Vascular Imaging  ABI (Date: 04/24/2014)  R: 1.24 , DP: tri, PT: tri  L: 1.20, DP: tri, PT: tri  Outside Studies/Documentation 4 pages of outside documents were reviewed including: outpatient PCP chart.  Medical Decision Making  ALIANNY TOELLE is a 59 y.o. female who presents with: neuropathic pain, DM   Pt's sx are not consistent with an arterial etiology.  The relatively normal ABI also reflects this fact.  The elevated ABI are consistent with some medial calcification seen with DM.  Her sx are more consistent with a neuropathy whether due to DM vs compressive mechanism remains to be determined.  Further work-up including orthopedic input likely beneficial.  I discussed in depth with the patient the nature of atherosclerosis, and emphasized the importance of maximal medical management including strict control of blood pressure, blood glucose, and lipid levels, antiplatelet agents, obtaining regular exercise, and cessation of smoking.  The patient is aware that without maximal medical management the underlying atherosclerotic disease process will progress, limiting the benefit of any interventions. The patient is currently not on a statin.  Will defer to her PCP determination of need for such. The patient is currently not on an anti-platelet.  I suspect the risk to  benefit ratio in this patient would favor starting ASA 81 mg PO daily but will defer to her PCP again.  Thank you for allowing [name] to participate in this patient's care.  Adele Barthel, MD [facility] Office: [phone] Pager: [phone]  04/24/2014, 12:49 PM

## 2014-05-07 DIAGNOSIS — F251 Schizoaffective disorder, depressive type: Secondary | ICD-10-CM | POA: Diagnosis not present

## 2014-05-19 ENCOUNTER — Other Ambulatory Visit (HOSPITAL_COMMUNITY): Payer: Self-pay | Admitting: Family Medicine

## 2014-05-19 DIAGNOSIS — M79606 Pain in leg, unspecified: Secondary | ICD-10-CM

## 2014-05-19 DIAGNOSIS — R202 Paresthesia of skin: Secondary | ICD-10-CM

## 2014-05-20 ENCOUNTER — Encounter: Payer: Self-pay | Admitting: Vascular Surgery

## 2014-05-20 ENCOUNTER — Encounter (HOSPITAL_COMMUNITY): Payer: Self-pay

## 2014-05-23 ENCOUNTER — Ambulatory Visit (HOSPITAL_COMMUNITY)
Admission: RE | Admit: 2014-05-23 | Discharge: 2014-05-23 | Disposition: A | Discharge status: Home / Self Care | Payer: 59 | Source: Ambulatory Visit | Attending: Family Medicine | Admitting: Family Medicine

## 2014-05-23 DIAGNOSIS — M79606 Pain in leg, unspecified: Secondary | ICD-10-CM

## 2014-05-23 DIAGNOSIS — R202 Paresthesia of skin: Secondary | ICD-10-CM

## 2014-05-23 DIAGNOSIS — R208 Other disturbances of skin sensation: Secondary | ICD-10-CM | POA: Insufficient documentation

## 2014-05-27 ENCOUNTER — Ambulatory Visit (INDEPENDENT_AMBULATORY_CARE_PROVIDER_SITE_OTHER): Payer: Self-pay | Admitting: Family Medicine

## 2014-05-27 VITALS — BP 131/82 | HR 87 | Ht 66.0 in | Wt 214.8 lb

## 2014-05-27 DIAGNOSIS — E118 Type 2 diabetes mellitus with unspecified complications: Secondary | ICD-10-CM

## 2014-05-27 NOTE — Progress Notes (Signed)
Patient presents today for DM follow-up as part of employer-sponsored Link to [employer]. Medications, glucose readings, a1c and compliance have been reviewed. I have also discussed with patient lifestyle interventions including diet and exercise. Details of the visit can be found in [employer] documenting program through [facility]). Patient has set a series of personal goals and will follow-up in no more than 3 months for further review of DM.   Today's visit focused heavily on improving diet habits especially making good purchases at [employer]. Also a discussion about improving exercise habits as allowed by her leg pain. A1c remains controlled on current regimen.

## 2014-05-30 DIAGNOSIS — M5442 Lumbago with sciatica, left side: Secondary | ICD-10-CM | POA: Diagnosis not present

## 2014-05-30 DIAGNOSIS — M542 Cervicalgia: Secondary | ICD-10-CM | POA: Diagnosis not present

## 2014-05-30 DIAGNOSIS — M5441 Lumbago with sciatica, right side: Secondary | ICD-10-CM | POA: Diagnosis not present

## 2014-05-30 DIAGNOSIS — M5136 Other intervertebral disc degeneration, lumbar region: Secondary | ICD-10-CM | POA: Diagnosis not present

## 2014-05-30 DIAGNOSIS — M5032 Other cervical disc degeneration, mid-cervical region: Secondary | ICD-10-CM | POA: Diagnosis not present

## 2014-06-17 DIAGNOSIS — M5442 Lumbago with sciatica, left side: Secondary | ICD-10-CM | POA: Diagnosis not present

## 2014-06-17 DIAGNOSIS — M5136 Other intervertebral disc degeneration, lumbar region: Secondary | ICD-10-CM | POA: Diagnosis not present

## 2014-07-01 ENCOUNTER — Ambulatory Visit: Payer: 59 | Admitting: Physical Therapy

## 2014-07-09 ENCOUNTER — Ambulatory Visit: Payer: 59 | Attending: Physical Medicine and Rehabilitation

## 2014-07-09 DIAGNOSIS — M436 Torticollis: Secondary | ICD-10-CM

## 2014-07-09 DIAGNOSIS — M545 Low back pain: Secondary | ICD-10-CM | POA: Diagnosis not present

## 2014-07-09 DIAGNOSIS — R6889 Other general symptoms and signs: Secondary | ICD-10-CM

## 2014-07-09 DIAGNOSIS — M542 Cervicalgia: Secondary | ICD-10-CM | POA: Diagnosis not present

## 2014-07-09 DIAGNOSIS — M2560 Stiffness of unspecified joint, not elsewhere classified: Secondary | ICD-10-CM | POA: Diagnosis not present

## 2014-07-09 DIAGNOSIS — R293 Abnormal posture: Secondary | ICD-10-CM

## 2014-07-09 NOTE — Therapy (Signed)
[place], [place], [address] Phone: [phone]   Fax:  [phone]  Physical Therapy Evaluation  Patient Details  Name: Deanna Rowe MRN: [id number] Date of Birth: [birth year] Referring Provider:  Suella Broad, MD  Encounter Date: 07/09/2014      PT End of Session - 07/09/14 1351    Visit Number 1   Number of Visits 12   Date for PT Re-Evaluation 08/20/14   PT Start Time 0110   PT Stop Time 0210   PT Time Calculation (min) 60 min   Activity Tolerance Patient tolerated treatment well   Behavior During Therapy [facility] for tasks assessed/performed      Past Medical History  Diagnosis Date  . Diabetes mellitus   . Hypertension   . Obesity   . Anxiety   . Depression   . Diabetes mellitus, type II   . PONV (postoperative nausea and vomiting)   . Dry cough   . TIA (transient ischemic attack) 2012    no residual problems  . Arthritis     L HIP  . Avascular necrosis of hip     LEFT  . GERD (gastroesophageal reflux disease)   . Urgency incontinence   . Insomnia     takes Ambien nightly    Past Surgical History  Procedure Laterality Date  . Dilation and curettage of uterus    . Esophageal manometry N/A 09/03/2012    Procedure: ESOPHAGEAL MANOMETRY (EM);  Surgeon: Garlan Fair, MD;  Location: [facility];  Service: Endoscopy;  Laterality: N/A;  . Joint replacement  2011    rt total hip  . Exploratory laparotomy    . Pilonidal cyst / sinus excision    . Total hip arthroplasty Left 10/09/2013    Procedure: LEFT TOTAL HIP ARTHROPLASTY ANTERIOR APPROACH;  Surgeon: [name], MD;  Location: [facility];  Service: Orthopedics;  Laterality: Left;    There were no vitals filed for this visit.  Visit Diagnosis:  Neck pain, bilateral - Plan: PT plan of care cert/re-cert  Bilateral low back pain, with sciatica presence unspecified - Plan: PT plan of care cert/re-cert  Stiffness of cervical spine  - Plan: PT plan of care cert/re-cert  Joint stiffness of spine - Plan: PT plan of care cert/re-cert  Abnormal posture - Plan: PT plan of care cert/re-cert  Activity intolerance - Plan: PT plan of care cert/re-cert      Subjective Assessment - 07/09/14 1317    Subjective Neck and RT arm  (started 8 weeks) , back and leg pain ( 6 weeks), Neither cold nor heat eased pain   Pertinent History She reports problems walking (bilateral hip replacement). She was out walking and began to have leg pain . She had no pain prior to this.  DDD neck and back   Limitations Walking;Sitting;House hold activities  PM pain, yard work   How long can you sit comfortably? 25 min   How long can you stand comfortably? 45 min   How long can you walk comfortably? 50 feet   Diagnostic tests xray, [name] (no PVD)   Patient Stated Goals reduce pain   Currently in Pain? Yes   Pain Score 4    Pain Location Neck   Pain Orientation Right   Pain Descriptors / Indicators Aching   Pain Type Acute pain   Pain Radiating Towards Rt arm   Pain Onset More than a month ago   Pain Frequency Intermittent  Aggravating Factors  bending over   Pain Relieving Factors medication   Effect of Pain on Daily Activities linmtis bending for dishes and laundry   Multiple Pain Sites Yes   Pain Score 8   Pain Location Back   Pain Orientation Posterior;Left;Right  LT more than RT   Pain Descriptors / Indicators Sharp;Aching   Pain Type Acute pain   Pain Radiating Towards both posterior thighs   Pain Onset More than a month ago   Pain Frequency Constant   Aggravating Factors  walking,  lying down, sitting,     Pain Relieving Factors medication, short walks. pillows under knees   Effect of Pain on Daily Activities Limited activity.             [facility] PT Assessment - 07/09/14 1312    Assessment   Medical Diagnosis cervicalgia and LBP   Onset Date --  6 weeks ago   Precautions   Precautions None   Restrictions   Weight Bearing  Restrictions No   Balance Screen   Has the patient fallen in the past 6 months No   Has the patient had a decrease in activity level because of a fear of falling?  No   Prior Function   Level of Independence Independent with basic ADLs   Cognition   Overall Cognitive Status Within Functional Limits for tasks assessed   Posture/Postural Control   Posture Comments slumped sitting and forward head sitting , standing RT shoulder lower and scapula more pposterior   ROM / Strength   AROM / PROM / Strength AROM;PROM;Strength   AROM   AROM Assessment Site Cervical;Lumbar   Cervical Flexion 35   Cervical Extension 42   Cervical - Right Side Bend 48   Cervical - Left Side Bend 48   Cervical - Right Rotation 60   Cervical - Left Rotation 55   Lumbar Flexion touches proximal tibia   Lumbar Extension none   Lumbar - Right Side Bend 30   Lumbar - Left Side Bend 30   PROM   PROM Assessment Site Cervical     UE with normal strength and LE WNL . Did not test hips              [facility] Adult PT Treatment/Exercise - 07/09/14 1312    Modalities   Modalities Electrical Stimulation   Electrical Stimulation   Electrical Stimulation Location neck and back   Electrical Stimulation Action IFC   Electrical Stimulation Parameters L8   Electrical Stimulation Goals Pain   Manual Therapy   Manual Therapy Manual Traction   Manual Traction to neck grade 2-3                PT Education - 07/09/14 1349    Education provided Yes   Education Details POC, posture   Person(s) Educated Patient   Methods Explanation;Demonstration;Tactile cues;Verbal cues;Handout   Comprehension Returned demonstration;Verbalized understanding          PT Short Term Goals - 07/09/14 1354    PT SHORT TERM GOAL #1   Title Independent with inital HEP   Time 3   Period Weeks   Status New   PT SHORT TERM GOAL #2   Title She will report neck pain decreased 40% or more   Time 3   Period Weeks   Status  New   PT SHORT TERM GOAL #3   Title She will report back pian decreaed 40% or more and able to walk with more  comfort   Time 3   Period Weeks   Status New   PT SHORT TERM GOAL #4   Title She will be able to do dishes an dlaundry with less pain   Time 3   Period Weeks   Status New           PT Long Term Goals - 07/09/14 1355    PT LONG TERM GOAL #1   Title She will be independent with all hEP issued as of last visit   Time 6   Period Weeks   Status New   PT LONG TERM GOAL #2   Title She will report no neck pain with home tasks.    Time 6   Period Weeks   Status New   PT LONG TERM GOAL #3   Title she will report no RT arm symptoms   Time 6   Period Weeks   Status New   PT LONG TERM GOAL #4   Title she will report 1-2 max back pain and return to home tasks   Time 6   Period Weeks   Status New   PT LONG TERM GOAL #5   Title return to helping spouse with yard tasks   Time 6   Period Weeks   Status New   Additional Long Term Goals   Additional Long Term Goals Yes   PT LONG TERM GOAL #6   Title return to walking for exercise at mall   Time 6   Period Weeks   Status New               Plan - 07/09/14 1352    Clinical Impression Statement Multiple pain areas, abnormal posture and limited range in back and neck.    Pt will benefit from skilled therapeutic intervention in order to improve on the following deficits Decreased range of motion;Difficulty walking;Postural dysfunction;Decreased activity tolerance;Increased muscle spasms;Pain;Decreased mobility   Rehab Potential Good   PT Frequency 2x / week   PT Duration 6 weeks   PT Treatment/Interventions Moist Heat;Electrical Stimulation;Patient/family education;Passive range of motion;Therapeutic exercise;Manual techniques;Dry needling;Traction   PT Next Visit Plan Traction trial , STW, range neck and back, review posture, electric stim if helpful   PT Home Exercise Plan posture   Consulted and Agree with Plan  of Care Patient         Problem List Patient Active Problem List   Diagnosis Date Noted  . 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 2 diabetes mellitus 04/24/2014  . Postoperative anemia due to acute blood loss 10/10/2013  . Avascular necrosis of bone of left hip 10/09/2013  . OA (osteoarthritis) of hip 10/09/2013  . Cough 07/31/2013  . Shortness of breath 06/18/2013  . Anxiety   . Diabetes mellitus, type II   . Esophageal dysphagia 09/12/2012  . Depression 02/21/2012  . Stroke 02/11/2012    Darrel Hoover PT 07/09/2014, 2:04 PM  [place] [facility] [address] [place], [place], [address] Phone: [phone]   Fax:  [phone]

## 2014-07-09 NOTE — Patient Instructions (Signed)
Postural instruction and rational with support

## 2014-07-11 DIAGNOSIS — Z01419 Encounter for gynecological examination (general) (routine) without abnormal findings: Secondary | ICD-10-CM | POA: Diagnosis not present

## 2014-07-15 DIAGNOSIS — Z0000 Encounter for general adult medical examination without abnormal findings: Secondary | ICD-10-CM | POA: Diagnosis not present

## 2014-07-15 DIAGNOSIS — N183 Chronic kidney disease, stage 3 (moderate): Secondary | ICD-10-CM | POA: Diagnosis not present

## 2014-07-15 DIAGNOSIS — I10 Essential (primary) hypertension: Secondary | ICD-10-CM | POA: Diagnosis not present

## 2014-07-15 DIAGNOSIS — E119 Type 2 diabetes mellitus without complications: Secondary | ICD-10-CM | POA: Diagnosis not present

## 2014-07-15 DIAGNOSIS — F339 Major depressive disorder, recurrent, unspecified: Secondary | ICD-10-CM | POA: Diagnosis not present

## 2014-07-15 DIAGNOSIS — E559 Vitamin D deficiency, unspecified: Secondary | ICD-10-CM | POA: Diagnosis not present

## 2014-07-15 DIAGNOSIS — E780 Pure hypercholesterolemia: Secondary | ICD-10-CM | POA: Diagnosis not present

## 2014-07-15 DIAGNOSIS — E1122 Type 2 diabetes mellitus with diabetic chronic kidney disease: Secondary | ICD-10-CM | POA: Diagnosis not present

## 2014-07-15 DIAGNOSIS — Z23 Encounter for immunization: Secondary | ICD-10-CM | POA: Diagnosis not present

## 2014-07-15 DIAGNOSIS — L659 Nonscarring hair loss, unspecified: Secondary | ICD-10-CM | POA: Diagnosis not present

## 2014-07-16 ENCOUNTER — Other Ambulatory Visit (HOSPITAL_COMMUNITY): Payer: Self-pay | Admitting: Physical Medicine and Rehabilitation

## 2014-07-16 DIAGNOSIS — M5416 Radiculopathy, lumbar region: Secondary | ICD-10-CM

## 2014-07-18 ENCOUNTER — Ambulatory Visit: Payer: 59

## 2014-07-18 DIAGNOSIS — M545 Low back pain: Secondary | ICD-10-CM | POA: Diagnosis not present

## 2014-07-18 DIAGNOSIS — R293 Abnormal posture: Secondary | ICD-10-CM | POA: Diagnosis not present

## 2014-07-18 DIAGNOSIS — M542 Cervicalgia: Secondary | ICD-10-CM | POA: Diagnosis not present

## 2014-07-18 DIAGNOSIS — M2560 Stiffness of unspecified joint, not elsewhere classified: Secondary | ICD-10-CM | POA: Diagnosis not present

## 2014-07-18 DIAGNOSIS — M436 Torticollis: Secondary | ICD-10-CM | POA: Diagnosis not present

## 2014-07-18 DIAGNOSIS — R6889 Other general symptoms and signs: Secondary | ICD-10-CM | POA: Diagnosis not present

## 2014-07-18 NOTE — Patient Instructions (Signed)
Lower Trunk Rotation Stretch   Keeping back flat and feet together, rotate knees to left side. Hold _15-20___ seconds. Repeat _3___ times per set. Do __1-2__ sets per session. Do ___2Knee to Chest   Lying supine, bend involved knee to chest _3__ times. Repeat with other leg. Do __2_ times per day.  Hold 15 secNeck: Lateral Tilt   Support child's trunk and shoulder. Start with head in center. Child gently tilts toward right shoulder. Keep chin tucked. Do not allow trunk or shoulders to move. Child may assist. Hold _15-20___ seconds. Repeat _2___ times. Do ___2_ sessions per day. CAUTION: Movement should be gentle, steady and slow.  Copyright  VHI. All rights reserved.  AROM: Neck Rotation   Turn head slowly to look over one shoulder, then the other. Hold each position _15-20 ___ seconds. Repeat __2-3__ times per set. Do __1__ sets per session. Do __2__ sessions per day. CAN DO LYING OR SITTING  http://orth.exer.us/295   Copyright  VHI. All rights reserved.    Copyright  VHI. All rights reserved.  _ sessions per day.  http://orth.exer.us/123   Copyright  VHI. All rights reserved.                                                                                                                                                                                                                                                                                                               REACH OVERHEAD WITH ONE ARM AND FLATTEN THE OPPOSITE LEG TO BED. HOLD FOR 5-10 SEC THEN DO OPPOSITE ARM AND LEG.    5X EACH ARM AND LEG  2X/DAY

## 2014-07-18 NOTE — Therapy (Signed)
[place], [place], [address] Phone: [phone]   Fax:  [phone]  Physical Therapy Treatment  Patient Details  Name: Deanna Rowe MRN: [id number] Date of Birth: 08/29/1955 Referring Provider:  Leighton Ruff, MD  Encounter Date: 07/18/2014      PT End of Session - 07/18/14 0847    Visit Number 2   Number of Visits 12   Date for PT Re-Evaluation 08/20/14   PT Start Time 0808   PT Stop Time 0905   PT Time Calculation (min) 57 min   Activity Tolerance Patient tolerated treatment well;Patient limited by lethargy   Behavior During Therapy [facility] for tasks assessed/performed      Past Medical History  Diagnosis Date  . Diabetes mellitus   . Hypertension   . Obesity   . Anxiety   . Depression   . Diabetes mellitus, type II   . PONV (postoperative nausea and vomiting)   . Dry cough   . TIA (transient ischemic attack) 2012    no residual problems  . Arthritis     L HIP  . Avascular necrosis of hip     LEFT  . GERD (gastroesophageal reflux disease)   . Urgency incontinence   . Insomnia     takes Ambien nightly    Past Surgical History  Procedure Laterality Date  . Dilation and curettage of uterus    . Esophageal manometry N/A 09/03/2012    Procedure: ESOPHAGEAL MANOMETRY (EM);  Surgeon: Garlan Fair, MD;  Location: [facility];  Service: Endoscopy;  Laterality: N/A;  . Joint replacement  2011    rt total hip  . Exploratory laparotomy    . Pilonidal cyst / sinus excision    . Total hip arthroplasty Left 10/09/2013    Procedure: LEFT TOTAL HIP ARTHROPLASTY ANTERIOR APPROACH;  Surgeon: [name], MD;  Location: [facility];  Service: Orthopedics;  Laterality: Left;    There were no vitals filed for this visit.  Visit Diagnosis:  Neck pain, bilateral  Bilateral low back pain, with sciatica presence unspecified  Stiffness of cervical spine  Joint stiffness of spine       Subjective Assessment - 07/18/14 0815    Subjective She reports neck better but back and legs painful   Currently in Pain? Yes   Pain Score 5    Multiple Pain Sites Yes   Pain Score 8                       [facility] Adult PT Treatment/Exercise - 07/18/14 0816    Exercises   Exercises Lumbar;Knee/Hip;Neck   Lumbar Exercises: Stretches   Lower Trunk Rotation 20 seconds;3 reps  RT and LT   Lumbar Exercises: Aerobic   Stationary Bike Nustep L4 7 min UE and LE   Lumbar Exercises: Supine   Other Supine Lumbar Exercises Diagonal stretching with LE ext to mat abd arm overhaed x 5  each.      Initiated HEP   HMP and IFC to back and neck post exercise. 15 min in supine        PT Education - 07/18/14 0846    Education provided Yes   Education Details HEP   Person(s) Educated Patient   Methods Explanation;Tactile cues;Verbal cues;Handout   Comprehension Returned demonstration          PT Short Term Goals - 07/09/14 1354    PT SHORT TERM GOAL #1  Title Independent with inital HEP   Time 3   Period Weeks   Status New   PT SHORT TERM GOAL #2   Title She will report neck pain decreased 40% or more   Time 3   Period Weeks   Status New   PT SHORT TERM GOAL #3   Title She will report back pian decreaed 40% or more and able to walk with more comfort   Time 3   Period Weeks   Status New   PT SHORT TERM GOAL #4   Title She will be able to do dishes an dlaundry with less pain   Time 3   Period Weeks   Status New           PT Long Term Goals - 07/09/14 1355    PT LONG TERM GOAL #1   Title She will be independent with all hEP issued as of last visit   Time 6   Period Weeks   Status New   PT LONG TERM GOAL #2   Title She will report no neck pain with home tasks.    Time 6   Period Weeks   Status New   PT LONG TERM GOAL #3   Title she will report no RT arm symptoms   Time 6   Period Weeks   Status New   PT LONG TERM GOAL #4   Title she will  report 1-2 max back pain and return to home tasks   Time 6   Period Weeks   Status New   PT LONG TERM GOAL #5   Title return to helping spouse with yard tasks   Time 6   Period Weeks   Status New   Additional Long Term Goals   Additional Long Term Goals Yes   PT LONG TERM GOAL #6   Title return to walking for exercise at mall   Time 6   Period Weeks   Status New               Plan - 07/18/14 0848    Clinical Impression Statement initiated HEP . She is no different this weeks. Will progress exercise as able   PT Next Visit Plan Continue exercise and modality. STW next visit        Problem List Patient Active Problem List   Diagnosis Date Noted  . 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 2 diabetes mellitus 04/24/2014  . Postoperative anemia due to acute blood loss 10/10/2013  . Avascular necrosis of bone of left hip 10/09/2013  . OA (osteoarthritis) of hip 10/09/2013  . Cough 07/31/2013  . Shortness of breath 06/18/2013  . Anxiety   . Diabetes mellitus, type II   . Esophageal dysphagia 09/12/2012  . Depression 02/21/2012  . Stroke 02/11/2012    Darrel Hoover PT 07/18/2014, 8:50 AM  [facility] [address] [place], [place], [address] Phone: [phone]   Fax:  [phone]

## 2014-07-21 ENCOUNTER — Ambulatory Visit (HOSPITAL_COMMUNITY)
Admission: RE | Admit: 2014-07-21 | Discharge: 2014-07-21 | Disposition: A | Discharge status: Home / Self Care | Payer: 59 | Source: Ambulatory Visit | Attending: Physical Medicine and Rehabilitation | Admitting: Physical Medicine and Rehabilitation

## 2014-07-21 DIAGNOSIS — M545 Low back pain: Secondary | ICD-10-CM | POA: Diagnosis present

## 2014-07-21 DIAGNOSIS — M5126 Other intervertebral disc displacement, lumbar region: Secondary | ICD-10-CM | POA: Diagnosis not present

## 2014-07-21 DIAGNOSIS — M5127 Other intervertebral disc displacement, lumbosacral region: Secondary | ICD-10-CM | POA: Insufficient documentation

## 2014-07-21 DIAGNOSIS — M5416 Radiculopathy, lumbar region: Secondary | ICD-10-CM

## 2014-07-22 ENCOUNTER — Ambulatory Visit: Payer: 59

## 2014-07-22 DIAGNOSIS — M2560 Stiffness of unspecified joint, not elsewhere classified: Secondary | ICD-10-CM

## 2014-07-22 DIAGNOSIS — R293 Abnormal posture: Secondary | ICD-10-CM | POA: Diagnosis not present

## 2014-07-22 DIAGNOSIS — M545 Low back pain: Secondary | ICD-10-CM

## 2014-07-22 DIAGNOSIS — R6889 Other general symptoms and signs: Secondary | ICD-10-CM | POA: Diagnosis not present

## 2014-07-22 DIAGNOSIS — M436 Torticollis: Secondary | ICD-10-CM | POA: Diagnosis not present

## 2014-07-22 DIAGNOSIS — M542 Cervicalgia: Secondary | ICD-10-CM | POA: Diagnosis not present

## 2014-07-22 NOTE — Therapy (Signed)
[place], [place], [address] Phone: [phone]   Fax:  [phone]  Physical Therapy Treatment  Patient Details  Name: Deanna Rowe MRN: [id number] Date of Birth: October 05, 1955 Referring Provider:  Leighton Ruff, MD  Encounter Date: 07/22/2014      PT End of Session - 07/22/14 0830    Visit Number 3   Number of Visits 12   Date for PT Re-Evaluation 08/20/14   PT Start Time 0745   PT Stop Time 0845   PT Time Calculation (min) 60 min   Activity Tolerance Patient tolerated treatment well   Behavior During Therapy [facility] for tasks assessed/performed      Past Medical History  Diagnosis Date  . Diabetes mellitus   . Hypertension   . Obesity   . Anxiety   . Depression   . Diabetes mellitus, type II   . PONV (postoperative nausea and vomiting)   . Dry cough   . TIA (transient ischemic attack) 2012    no residual problems  . Arthritis     L HIP  . Avascular necrosis of hip     LEFT  . GERD (gastroesophageal reflux disease)   . Urgency incontinence   . Insomnia     takes Ambien nightly    Past Surgical History  Procedure Laterality Date  . Dilation and curettage of uterus    . Esophageal manometry N/A 09/03/2012    Procedure: ESOPHAGEAL MANOMETRY (EM);  Surgeon: Garlan Fair, MD;  Location: [facility];  Service: Endoscopy;  Laterality: N/A;  . Joint replacement  2011    rt total hip  . Exploratory laparotomy    . Pilonidal cyst / sinus excision    . Total hip arthroplasty Left 10/09/2013    Procedure: LEFT TOTAL HIP ARTHROPLASTY ANTERIOR APPROACH;  Surgeon: [name], MD;  Location: [facility];  Service: Orthopedics;  Laterality: Left;    There were no vitals filed for this visit.  Visit Diagnosis:  Bilateral low back pain, with sciatica presence unspecified  Joint stiffness of spine      Subjective Assessment - 07/22/14 0748    Subjective No rest last night due to hip and  leg pain.    Currently in Pain? Yes   Pain Score 7    Pain Location Hip  and thigh   Pain Orientation Left;Right   Pain Descriptors / Indicators Aching   Pain Onset More than a month ago   Pain Frequency Constant   Multiple Pain Sites No                         [facility] Adult PT Treatment/Exercise - 07/22/14 0749    Lumbar Exercises: Stretches   Lower Trunk Rotation 10 seconds;5 reps  Rt and LT    Lumbar Exercises: Aerobic   Stationary Bike Nustep L4 7 min UE and LE   Knee/Hip Exercises: Stretches   Sports administrator 3 reps;20 seconds  manual on prone   Hip Flexor Stretch 2 reps;20 seconds  RT and LT manual in prone   Piriformis Stretch 10 seconds;5 reps  RT and LT   Knee/Hip Exercises: Supine   Other Supine Knee Exercises clam stretch   Modalities   Modalities Moist Heat   Moist Heat Therapy   Number Minutes Moist Heat 20 Minutes   Moist Heat Location --  back and hips/thighs   Acupuncturist Stimulation Location --  back and hips   Electrical Stimulation Action IFC   Electrical Stimulation Parameters L8   Electrical Stimulation Goals Pain   Manual Therapy   Manual Therapy Massage;Myofascial release   Massage STW to gluteal and lower back   Myofascial Release with pressure to gluteals with rotation at hip                  PT Short Term Goals - 07/22/14 4193    PT SHORT TERM GOAL #2   Title She will report neck pain decreased 40% or more   Status Achieved           PT Long Term Goals - 07/09/14 1355    PT LONG TERM GOAL #1   Title She will be independent with all hEP issued as of last visit   Time 6   Period Weeks   Status New   PT LONG TERM GOAL #2   Title She will report no neck pain with home tasks.    Time 6   Period Weeks   Status New   PT LONG TERM GOAL #3   Title she will report no RT arm symptoms   Time 6   Period Weeks   Status New   PT LONG TERM GOAL #4   Title she will report 1-2 max back pain  and return to home tasks   Time 6   Period Weeks   Status New   PT LONG TERM GOAL #5   Title return to helping spouse with yard tasks   Time 6   Period Weeks   Status New   Additional Long Term Goals   Additional Long Term Goals Yes   PT LONG TERM GOAL #6   Title return to walking for exercise at mall   Time 6   Period Weeks   Status New               Plan - 07/22/14 0831    Clinical Impression Statement Tender in lower back and buttock Will work this soft tissue and stretch with modalities tos see if pain will reduce.    PT Next Visit Plan Continue exercise and modality. STW next visit   Consulted and Agree with Plan of Care Patient        Problem List Patient Active Problem List   Diagnosis Date Noted  . 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 2 diabetes mellitus 04/24/2014  . Postoperative anemia due to acute blood loss 10/10/2013  . Avascular necrosis of bone of left hip 10/09/2013  . OA (osteoarthritis) of hip 10/09/2013  . Cough 07/31/2013  . Shortness of breath 06/18/2013  . Anxiety   . Diabetes mellitus, type II   . Esophageal dysphagia 09/12/2012  . Depression 02/21/2012  . Stroke 02/11/2012    Darrel Hoover PT 07/22/2014, 8:34 AM  [facility] [address] [place], [place], [address] Phone: [phone]   Fax:  [phone]

## 2014-07-23 DIAGNOSIS — Z1151 Encounter for screening for human papillomavirus (HPV): Secondary | ICD-10-CM | POA: Diagnosis not present

## 2014-07-23 DIAGNOSIS — Z01419 Encounter for gynecological examination (general) (routine) without abnormal findings: Secondary | ICD-10-CM | POA: Diagnosis not present

## 2014-07-24 ENCOUNTER — Ambulatory Visit: Payer: 59

## 2014-07-24 DIAGNOSIS — R6889 Other general symptoms and signs: Secondary | ICD-10-CM | POA: Diagnosis not present

## 2014-07-24 DIAGNOSIS — M2560 Stiffness of unspecified joint, not elsewhere classified: Secondary | ICD-10-CM | POA: Diagnosis not present

## 2014-07-24 DIAGNOSIS — M545 Low back pain: Secondary | ICD-10-CM | POA: Diagnosis not present

## 2014-07-24 DIAGNOSIS — M542 Cervicalgia: Secondary | ICD-10-CM | POA: Diagnosis not present

## 2014-07-24 DIAGNOSIS — M436 Torticollis: Secondary | ICD-10-CM | POA: Diagnosis not present

## 2014-07-24 DIAGNOSIS — M5136 Other intervertebral disc degeneration, lumbar region: Secondary | ICD-10-CM | POA: Diagnosis not present

## 2014-07-24 DIAGNOSIS — R293 Abnormal posture: Secondary | ICD-10-CM | POA: Diagnosis not present

## 2014-07-24 DIAGNOSIS — M5416 Radiculopathy, lumbar region: Secondary | ICD-10-CM | POA: Diagnosis not present

## 2014-07-24 NOTE — Therapy (Signed)
[place], [place], [address] Phone: [phone]   Fax:  [phone]  Physical Therapy Treatment  Patient Details  Name: Deanna Rowe MRN: [id number] Date of Birth: 1956/01/01 Referring Provider:  Leighton Ruff, MD  Encounter Date: 07/24/2014      PT End of Session - 07/24/14 0818    Visit Number 4   Number of Visits 12   Date for PT Re-Evaluation 08/20/14   PT Start Time 0750   PT Stop Time 0840   PT Time Calculation (min) 50 min   Activity Tolerance Patient limited by pain   Behavior During Therapy [facility] for tasks assessed/performed      Past Medical History  Diagnosis Date  . Diabetes mellitus   . Hypertension   . Obesity   . Anxiety   . Depression   . Diabetes mellitus, type II   . PONV (postoperative nausea and vomiting)   . Dry cough   . TIA (transient ischemic attack) 2012    no residual problems  . Arthritis     L HIP  . Avascular necrosis of hip     LEFT  . GERD (gastroesophageal reflux disease)   . Urgency incontinence   . Insomnia     takes Ambien nightly    Past Surgical History  Procedure Laterality Date  . Dilation and curettage of uterus    . Esophageal manometry N/A 09/03/2012    Procedure: ESOPHAGEAL MANOMETRY (EM);  Surgeon: Garlan Fair, MD;  Location: [facility];  Service: Endoscopy;  Laterality: N/A;  . Joint replacement  2011    rt total hip  . Exploratory laparotomy    . Pilonidal cyst / sinus excision    . Total hip arthroplasty Left 10/09/2013    Procedure: LEFT TOTAL HIP ARTHROPLASTY ANTERIOR APPROACH;  Surgeon: [name], MD;  Location: [facility];  Service: Orthopedics;  Laterality: Left;    There were no vitals filed for this visit.  Visit Diagnosis:  Bilateral low back pain, with sciatica presence unspecified      Subjective Assessment - 07/24/14 0753    Subjective pain bad today. I did some cleaning yesterday.    Currently in Pain?  Yes   Pain Score 8    Pain Location Back  and hips                         [facility] Adult PT Treatment/Exercise - 07/24/14 0815    Lumbar Exercises: Aerobic   Stationary Bike Nustep L4 7 min  LE only   Moist Heat Therapy   Number Minutes Moist Heat 25 Minutes   Moist Heat Location --  back   Electrical Stimulation   Electrical Stimulation Location  back   Electrical Stimulation Action IFC   Electrical Stimulation Parameters L8   Electrical Stimulation Goals Pain   Manual Therapy   Massage --                PT Education - 07/24/14 [phone]    Education provided Yes   Education Details managng activity and pain during day   Person(s) Educated Patient   Methods Explanation   Comprehension Verbalized understanding          PT Short Term Goals - 07/22/14 0832    PT SHORT TERM GOAL #2   Title She will report neck pain decreased 40% or more   Status Achieved  PT Long Term Goals - 07/09/14 1355    PT LONG TERM GOAL #1   Title She will be independent with all hEP issued as of last visit   Time 6   Period Weeks   Status New   PT LONG TERM GOAL #2   Title She will report no neck pain with home tasks.    Time 6   Period Weeks   Status New   PT LONG TERM GOAL #3   Title she will report no RT arm symptoms   Time 6   Period Weeks   Status New   PT LONG TERM GOAL #4   Title she will report 1-2 max back pain and return to home tasks   Time 6   Period Weeks   Status New   PT LONG TERM GOAL #5   Title return to helping spouse with yard tasks   Time 6   Period Weeks   Status New   Additional Long Term Goals   Additional Long Term Goals Yes   PT LONG TERM GOAL #6   Title return to walking for exercise at mall   Time [place] - 07/24/14 4097    Clinical Impression Statement Increased pain today from over activity yesterday. Better post  treatment.    PT Next Visit Plan Continue  exercise and modality. STW next visit .Hopefully she will be better next visit to do exercise   Consulted and Agree with Plan of Care Patient        Problem List Patient Active Problem List   Diagnosis Date Noted  . 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 2 diabetes mellitus 04/24/2014  . Postoperative anemia due to acute blood loss 10/10/2013  . Avascular necrosis of bone of left hip 10/09/2013  . OA (osteoarthritis) of hip 10/09/2013  . Cough 07/31/2013  . Shortness of breath 06/18/2013  . Anxiety   . Diabetes mellitus, type II   . Esophageal dysphagia 09/12/2012  . Depression 02/21/2012  . Stroke 02/11/2012    Darrel Hoover PT 07/24/2014, 8:41 AM  [facility] [address] [place], [place], [address] Phone: [phone]   Fax:  [phone]

## 2014-07-24 NOTE — Patient Instructions (Signed)
I suggested she may have done too much at home and needed to think about breaking tasks into parts and rest between either during day or over course of a few days.  I suggested if she makes herself so painful she can't do much for the next day or more she would be better off with less pain  doing less per day.

## 2014-07-29 ENCOUNTER — Encounter: Payer: Self-pay | Admitting: Physical Therapy

## 2014-08-05 ENCOUNTER — Ambulatory Visit: Payer: 59 | Attending: Physical Medicine and Rehabilitation | Admitting: Physical Therapy

## 2014-08-05 DIAGNOSIS — M436 Torticollis: Secondary | ICD-10-CM | POA: Insufficient documentation

## 2014-08-05 DIAGNOSIS — M542 Cervicalgia: Secondary | ICD-10-CM | POA: Diagnosis not present

## 2014-08-05 DIAGNOSIS — R6889 Other general symptoms and signs: Secondary | ICD-10-CM

## 2014-08-05 DIAGNOSIS — R293 Abnormal posture: Secondary | ICD-10-CM | POA: Diagnosis not present

## 2014-08-05 DIAGNOSIS — M2560 Stiffness of unspecified joint, not elsewhere classified: Secondary | ICD-10-CM

## 2014-08-05 DIAGNOSIS — M545 Low back pain: Secondary | ICD-10-CM

## 2014-08-05 NOTE — Therapy (Signed)
[place], [place], [address] Phone: [phone]   Fax:  [phone]  Physical Therapy Treatment  Patient Details  Name: Deanna Rowe MRN: [id number] Date of Birth: [birth year] Referring Provider:  Leighton Ruff, MD  Encounter Date: 08/05/2014      PT End of Session - 08/05/14 1627    Visit Number 5   Number of Visits 12   Date for PT Re-Evaluation 08/20/14   PT Start Time 0805   PT Stop Time 0905   PT Time Calculation (min) 60 min   Activity Tolerance Patient tolerated treatment well      Past Medical History  Diagnosis Date  . Diabetes mellitus   . Hypertension   . Obesity   . Anxiety   . Depression   . Diabetes mellitus, type II   . PONV (postoperative nausea and vomiting)   . Dry cough   . TIA (transient ischemic attack) 2012    no residual problems  . Arthritis     L HIP  . Avascular necrosis of hip     LEFT  . GERD (gastroesophageal reflux disease)   . Urgency incontinence   . Insomnia     takes Ambien nightly    Past Surgical History  Procedure Laterality Date  . Dilation and curettage of uterus    . Esophageal manometry N/A 09/03/2012    Procedure: ESOPHAGEAL MANOMETRY (EM);  Surgeon: Garlan Fair, MD;  [facility];  Service: Endoscopy;  Laterality: N/A;  . Joint replacement  2011    rt total hip  . Exploratory laparotomy    . Pilonidal cyst / sinus excision    . Total hip arthroplasty Left 10/09/2013    Procedure: LEFT TOTAL HIP ARTHROPLASTY ANTERIOR APPROACH;  Surgeon: [name], MD;  [facility]: [facility];  Service: Orthopedics;  Laterality: Left;    There were no vitals filed for this visit.  Visit Diagnosis:  Bilateral low back pain, with sciatica presence unspecified  Joint stiffness of spine  Abnormal posture  Activity intolerance      Subjective Assessment - 08/05/14 0807    Subjective shoulder Rt 6/10, Back 7/10  Has been doing her  exercises.  Pain is unchanged. function unchange    Pain Score 7    Pain Location Back   Pain Orientation Right;Left   Pain Descriptors / Indicators Aching   Pain Radiating Towards Legs to ankles   Pain Frequency Constant   Aggravating Factors  sitting too long   Pain Relieving Factors avoids sitting, medication   Effect of Pain on Daily Activities limits sitting   Multiple Pain Sites Yes   Pain Score 6   Pain Location Shoulder   Pain Orientation Right   Pain Descriptors / Indicators Sharp;Aching   Pain Radiating Towards thumb and index   Pain Frequency Intermittent   Aggravating Factors  Sleeping on shoulder washing clothes,   Pain Relieving Factors not using arm as much   Effect of Pain on Daily Activities less use of arm                         [facility] Adult PT Treatment/Exercise - 08/05/14 0805    Posture/Postural Control   Posture/Postural Control --  Educationtoday, many modifications demonstrated, patient to    Lumbar Exercises: Stretches   Piriformis Stretch 3 reps;30 seconds   Lumbar Exercises: Aerobic   Stationary Bike Nustep L4 7 min  LE  only   Moist Heat Therapy   Number Minutes Moist Heat 15 Minutes   Moist Heat Location --  low back, gluteal   Electrical Stimulation   Electrical Stimulation Location Back, gluteal   Electrical Stimulation Action IFC   Electrical Stimulation Parameters To tolerance   Electrical Stimulation Goals Pain   Manual Therapy   Manual Therapy --  Neuromuscular trigger point release.   Massage Soft tissue work to Lt gluteal, piriformis, low back, tighter tissue softened, the piriformis lengthening with hip flexion and IR with strumming insertionto origin 3 reps                 PT Education - 08/05/14 0819    Education provided Yes   Education Details ADL   Person(s) Educated Patient   Methods Explanation;Demonstration;Handout   Comprehension Verbalized understanding          PT Short Term Goals -  08/05/14 1630    PT SHORT TERM GOAL #1   Title Independent with inital HEP   Time 3   Period Weeks   Status Achieved   PT SHORT TERM GOAL #2   Title She will report neck pain decreased 40% or more   Time 3   Period Weeks   Status Achieved   PT SHORT TERM GOAL #3   Title She will report back pian decreaed 40% or more and able to walk with more comfort   Time 3   Period Weeks   Status On-going   PT SHORT TERM GOAL #4   Title She will be able to do dishes an dlaundry with less pain   Baseline still painful   Time 3   Period Weeks   Status On-going           PT Long Term Goals - 08/05/14 1805    PT LONG TERM GOAL #1   Title She will be independent with all hEP issued as of last visit   Time 6   Period Weeks   Status On-going   PT LONG TERM GOAL #2   Title She will report no neck pain with home tasks.    Time 6   Period Weeks   Status On-going   PT LONG TERM GOAL #3   Title she will report no RT arm symptoms   Time 6   Period Weeks   Status On-going   PT LONG TERM GOAL #4   Title she will report 1-2 max back pain and return to home tasks   Time 6   Period Weeks   Status On-going   PT LONG TERM GOAL #5   Title return to helping spouse with yard tasks   Time 6   Period Weeks   Status On-going   PT LONG TERM GOAL #6   Title return to walking for exercise at mall   Time 6   Period Weeks   Status On-going               Plan - 08/05/14 1628    Clinical Impression Statement education , modalities, manual focus.  Pain decreased post manual.  Patient willing to try to change posture habits   PT Next Visit Plan answer any posture questions, exercise as able .Decompression? taping?        Problem List Patient Active Problem List   Diagnosis Date Noted  . 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 2 diabetes mellitus 04/24/2014  . Postoperative anemia due to acute blood loss 10/10/2013  . Avascular necrosis  of bone of left hip 10/09/2013  . OA (osteoarthritis) of hip  10/09/2013  . Cough 07/31/2013  . Shortness of breath 06/18/2013  . Anxiety   . Diabetes mellitus, type II   . Esophageal dysphagia 09/12/2012  . Depression 02/21/2012  . Stroke 02/11/2012    [facility] 08/05/2014, 6:09 PM  [place] [address] [place], [place], [address] Phone: [phone]   Fax:  [phone]  Melvenia Needles, PTA 08/05/2014 6:09 PM Phone: [phone] Fax: [phone]

## 2014-08-05 NOTE — Patient Instructions (Signed)

## 2014-08-08 ENCOUNTER — Ambulatory Visit: Payer: 59

## 2014-08-08 DIAGNOSIS — M545 Low back pain: Secondary | ICD-10-CM

## 2014-08-08 DIAGNOSIS — M2560 Stiffness of unspecified joint, not elsewhere classified: Secondary | ICD-10-CM | POA: Diagnosis not present

## 2014-08-08 DIAGNOSIS — M542 Cervicalgia: Secondary | ICD-10-CM | POA: Diagnosis not present

## 2014-08-08 DIAGNOSIS — R293 Abnormal posture: Secondary | ICD-10-CM | POA: Diagnosis not present

## 2014-08-08 DIAGNOSIS — R6889 Other general symptoms and signs: Secondary | ICD-10-CM | POA: Diagnosis not present

## 2014-08-08 DIAGNOSIS — M436 Torticollis: Secondary | ICD-10-CM | POA: Diagnosis not present

## 2014-08-08 NOTE — Therapy (Signed)
[place], [place], [address] Phone: [phone]   Fax:  [phone]  Physical Therapy Treatment  Patient Details  Name: Deanna Rowe MRN: [id number] Date of Birth: Mar 31, 1956 Referring Provider:  Leighton Ruff, MD  Encounter Date: 08/08/2014      PT End of Session - 08/08/14 0831    Visit Number 6   Number of Visits 12   Date for PT Re-Evaluation 08/20/14   PT Start Time 0750   PT Stop Time 0850   PT Time Calculation (min) 60 min   Activity Tolerance Patient tolerated treatment well  She reported she felt much better with pain at 5/10   Behavior During Therapy [facility] for tasks assessed/performed      Past Medical History  Diagnosis Date  . Diabetes mellitus   . Hypertension   . Obesity   . Anxiety   . Depression   . Diabetes mellitus, type II   . PONV (postoperative nausea and vomiting)   . Dry cough   . TIA (transient ischemic attack) 2012    no residual problems  . Arthritis     L HIP  . Avascular necrosis of hip     LEFT  . GERD (gastroesophageal reflux disease)   . Urgency incontinence   . Insomnia     takes Ambien nightly    Past Surgical History  Procedure Laterality Date  . Dilation and curettage of uterus    . Esophageal manometry N/A 09/03/2012    Procedure: ESOPHAGEAL MANOMETRY (EM);  Surgeon: Garlan Fair, MD;  [facility]: [facility];  Service: Endoscopy;  Laterality: N/A;  . Joint replacement  2011    rt total hip  . Exploratory laparotomy    . Pilonidal cyst / sinus excision    . Total hip arthroplasty Left 10/09/2013    Procedure: LEFT TOTAL HIP ARTHROPLASTY ANTERIOR APPROACH;  Surgeon: [name], MD;  Location: [facility];  Service: Orthopedics;  Laterality: Left;    There were no vitals filed for this visit.  Visit Diagnosis:  Bilateral low back pain, with sciatica presence unspecified  Joint stiffness of spine  Activity intolerance      Subjective  Assessment - 08/08/14 0751    Subjective 6/10 today   Currently in Pain? Yes   Pain Score 6    Pain Location Back   Pain Orientation Right;Left;Posterior   Pain Descriptors / Indicators Aching   Pain Type Chronic pain   Pain Onset More than a month ago   Pain Frequency Constant   Aggravating Factors  sitting , activity   Pain Relieving Factors meds , less sitting   Multiple Pain Sites Yes   Pain Score 6   Pain Location Shoulder   Pain Orientation Right                         [facility] Adult PT Treatment/Exercise - 08/08/14 0757    Lumbar Exercises: Aerobic   Stationary Bike Nustep L4 8 min  LE and UE   Lumbar Exercises: Supine   Ab Set 5 reps;5 seconds   Clam 5 reps  each leg with abdominal set   Bridge 10 reps  short range   Other Supine Lumbar Exercises opposit arnm and keg extension with active over pressure 2x5 RT and LT   Moist Heat Therapy   Number Minutes Moist Heat 25 Minutes   Moist Heat Location --  back with and post  exercise   Electrical Stimulation   Electrical Stimulation Location Back, gluteal   Electrical Stimulation Action IFC   Electrical Stimulation Goals Pain   Manual Therapy   Manual Therapy Joint mobilization   Joint Mobilization PA to central L1-5 and sacrum and lateral same levels Gr 3   Massage Soft tissue work to Lt gluteal, piriformis, low back, tighter tissue softened, the piriformis lengthening with hip flexion and IR with strumming insertionto origin 3 reps    Myofascial Release LT hip with IR with pressur eot gluteals to improve IR , This is painful but range improved 15 degrees                  PT Short Term Goals - 08/05/14 1630    PT SHORT TERM GOAL #1   Title Independent with inital HEP   Time 3   Period Weeks   Status Achieved   PT SHORT TERM GOAL #2   Title She will report neck pain decreased 40% or more   Time 3   Period Weeks   Status Achieved   PT SHORT TERM GOAL #3   Title She will report back  pian decreaed 40% or more and able to walk with more comfort   Time 3   Period Weeks   Status On-going   PT SHORT TERM GOAL #4   Title She will be able to do dishes an dlaundry with less pain   Baseline still painful   Time 3   Period Weeks   Status On-going           PT Long Term Goals - 08/05/14 1805    PT LONG TERM GOAL #1   Title She will be independent with all hEP issued as of last visit   Time 6   Period Weeks   Status On-going   PT LONG TERM GOAL #2   Title She will report no neck pain with home tasks.    Time 6   Period Weeks   Status On-going   PT LONG TERM GOAL #3   Title she will report no RT arm symptoms   Time 6   Period Weeks   Status On-going   PT LONG TERM GOAL #4   Title she will report 1-2 max back pain and return to home tasks   Time 6   Period Weeks   Status On-going   PT LONG TERM GOAL #5   Title return to helping spouse with yard tasks   Time 6   Period Weeks   Status On-going   PT LONG TERM GOAL #6   Title return to walking for exercise at mall   Time 6   Period Weeks   Status On-going               Plan - 08/08/14 0831    Clinical Impression Statement She is not improved pain levels thug she cna do more with exericse that earlier. Will see 4 more visits and if pain levels not improved with discharge   PT Next Visit Plan Continue exericses and modalities and manual          Review HEP   Check neck and back range   Consulted and Agree with Plan of Care Patient        Problem List Patient Active Problem List   Diagnosis Date Noted  . 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 2 diabetes mellitus 04/24/2014  . Postoperative anemia due to acute blood loss 10/10/2013  .  Avascular necrosis of bone of left hip 10/09/2013  . OA (osteoarthritis) of hip 10/09/2013  . Cough 07/31/2013  . Shortness of breath 06/18/2013  . Anxiety   . Diabetes mellitus, type II   . Esophageal dysphagia 09/12/2012  . Depression 02/21/2012  . Stroke 02/11/2012     Darrel Hoover PT 08/08/2014, 8:59 AM  [facility] [address] [place], [place], [address] Phone: [phone]   Fax:  [phone]

## 2014-08-12 ENCOUNTER — Ambulatory Visit: Payer: 59 | Admitting: Physical Therapy

## 2014-08-12 DIAGNOSIS — M542 Cervicalgia: Secondary | ICD-10-CM

## 2014-08-12 DIAGNOSIS — M436 Torticollis: Secondary | ICD-10-CM

## 2014-08-12 DIAGNOSIS — M545 Low back pain: Secondary | ICD-10-CM | POA: Diagnosis not present

## 2014-08-12 DIAGNOSIS — R293 Abnormal posture: Secondary | ICD-10-CM | POA: Diagnosis not present

## 2014-08-12 DIAGNOSIS — M2560 Stiffness of unspecified joint, not elsewhere classified: Secondary | ICD-10-CM | POA: Diagnosis not present

## 2014-08-12 DIAGNOSIS — R6889 Other general symptoms and signs: Secondary | ICD-10-CM | POA: Diagnosis not present

## 2014-08-12 NOTE — Therapy (Signed)
[place], [place], [address] Phone: [phone]   Fax:  [phone]  Physical Therapy Treatment  Patient Details  Name: Deanna Rowe MRN: [id number] Date of Birth: 08-Jun-1955 Referring Provider:  Leighton Ruff, MD  Encounter Date: 08/12/2014      PT End of Session - 08/12/14 1810    Visit Number 7   Number of Visits 12   Date for PT Re-Evaluation 08/20/14   PT Start Time 0803   PT Stop Time 0903   PT Time Calculation (min) 60 min   Activity Tolerance Patient tolerated treatment well;Patient limited by pain      Past Medical History  Diagnosis Date  . Diabetes mellitus   . Hypertension   . Obesity   . Anxiety   . Depression   . Diabetes mellitus, type II   . PONV (postoperative nausea and vomiting)   . Dry cough   . TIA (transient ischemic attack) 2012    no residual problems  . Arthritis     L HIP  . Avascular necrosis of hip     LEFT  . GERD (gastroesophageal reflux disease)   . Urgency incontinence   . Insomnia     takes Ambien nightly    Past Surgical History  Procedure Laterality Date  . Dilation and curettage of uterus    . Esophageal manometry N/A 09/03/2012    Procedure: ESOPHAGEAL MANOMETRY (EM);  Surgeon: Garlan Fair, MD;  [facility]: [facility];  Service: Endoscopy;  Laterality: N/A;  . Joint replacement  2011    rt total hip  . Exploratory laparotomy    . Pilonidal cyst / sinus excision    . Total hip arthroplasty Left 10/09/2013    Procedure: LEFT TOTAL HIP ARTHROPLASTY ANTERIOR APPROACH;  Surgeon: [name], MD;  Location: [facility];  Service: Orthopedics;  Laterality: Left;    There were no vitals filed for this visit.  Visit Diagnosis:  Bilateral low back pain, with sciatica presence unspecified  Joint stiffness of spine  Neck pain, bilateral  Stiffness of cervical spine      Subjective Assessment - 08/12/14 0810    Subjective Arm pain daily,  not as frequently, not as strong  and less often into hand RT,  Back less pain, Rt leg less pain  Less frequent, not every day,  Lt leg still has pain just not as severe.  Has Lt leg pain just not as severe   Currently in Pain? Yes   Pain Score 5    Pain Location Buttocks   Pain Descriptors / Indicators Sharp   Pain Radiating Towards ankles   Pain Frequency Constant   Aggravating Factors  sitting and at night   Pain Relieving Factors less sitting, medication, modalities, manual   Effect of Pain on Daily Activities limit sitting , sleepin   Multiple Pain Sites Yes   Pain Score 4   Pain Location Shoulder  arm neck Rt   Pain Descriptors / Indicators Aching   Aggravating Factors  ironing, washing, sleeping RT side too much, sleeping on her back   Pain Relieving Factors change of position                         [facility] Adult PT Treatment/Exercise - 08/12/14 0816    Neck Exercises: Standing   Other Standing Exercises ROM  felxion 47, 37 extension pain7/10, Lateral flexion, 40 LT   35 Rt.,  rotation 40 Rt 35 Lt rotation.   Other Standing Exercises gentle active ROM, also Deep neck flexor stretches 5 reps 5 second holds with instruction.   Lumbar Exercises: Standing   Other Standing Lumbar Exercises ROM flexion reaches 10 inches from floor 7/10,, 10 degrees extension 8/10,25 Rt 20 Lt sidebend   Electrical Stimulation   Electrical Stimulation Location Back, gluteal   Electrical Stimulation Action IFC   Electrical Stimulation Parameters To tolerance   Electrical Stimulation Goals Pain   Manual Therapy   Massage soft tissue work and strumming gluteal, low back                  PT Short Term Goals - 08/12/14 1813    PT SHORT TERM GOAL #1   Title Independent with inital HEP   Time 3   Period Weeks   Status Achieved   PT SHORT TERM GOAL #2   Title She will report neck pain decreased 40% or more   Time 3   Period Weeks   Status Achieved   PT SHORT TERM GOAL #3    Title She will report back pian decreaed 40% or more and able to walk with more comfort   Time 3   Period Weeks   Status On-going   PT SHORT TERM GOAL #4   Title She will be able to do dishes an dlaundry with less pain   Time 3   Period Weeks   Status On-going           PT Long Term Goals - 08/12/14 1813    PT LONG TERM GOAL #1   Title She will be independent with all hEP issued as of last visit   Status On-going   PT LONG TERM GOAL #2   Title She will report no neck pain with home tasks.    Status On-going   PT LONG TERM GOAL #3   Title she will report no RT arm symptoms   Time 6   Period Weeks   Status On-going   PT LONG TERM GOAL #4   Title she will report 1-2 max back pain and return to home tasks   Time 6   Period Weeks   Status On-going   PT LONG TERM GOAL #5   Title return to helping spouse with yard tasks   Time 6   Period Weeks   Status On-going   PT LONG TERM GOAL #6   Title return to walking for exercise at mall   Time 6   Period Weeks   Status On-going               Plan - 08/12/14 1812    Clinical Impression Statement See subjective section for pain improvements.          Problem List Patient Active Problem List   Diagnosis Date Noted  . 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 2 diabetes mellitus 04/24/2014  . Postoperative anemia due to acute blood loss 10/10/2013  . Avascular necrosis of bone of left hip 10/09/2013  . OA (osteoarthritis) of hip 10/09/2013  . Cough 07/31/2013  . Shortness of breath 06/18/2013  . Anxiety   . Diabetes mellitus, type II   . Esophageal dysphagia 09/12/2012  . Depression 02/21/2012  . Stroke 02/11/2012    [facility] 08/12/2014, 6:16 PM  [facility] [address] [place], [place], [address] Phone: [phone]   Fax:  [phone]  Melvenia Needles, PTA 08/12/2014 6:16 PM Phone: [phone]  Fax: [phone]

## 2014-08-13 ENCOUNTER — Encounter (HOSPITAL_COMMUNITY): Payer: Self-pay | Admitting: Emergency Medicine

## 2014-08-13 ENCOUNTER — Emergency Department (HOSPITAL_COMMUNITY)
Admission: EM | Admit: 2014-08-13 | Discharge: 2014-08-14 | Disposition: A | Discharge status: Home / Self Care | Payer: 59 | Attending: Emergency Medicine | Admitting: Emergency Medicine

## 2014-08-13 DIAGNOSIS — M1990 Unspecified osteoarthritis, unspecified site: Secondary | ICD-10-CM | POA: Insufficient documentation

## 2014-08-13 DIAGNOSIS — Z7982 Long term (current) use of aspirin: Secondary | ICD-10-CM | POA: Diagnosis not present

## 2014-08-13 DIAGNOSIS — R110 Nausea: Secondary | ICD-10-CM | POA: Diagnosis not present

## 2014-08-13 DIAGNOSIS — R45851 Suicidal ideations: Secondary | ICD-10-CM | POA: Diagnosis not present

## 2014-08-13 DIAGNOSIS — Z8673 Personal history of transient ischemic attack (TIA), and cerebral infarction without residual deficits: Secondary | ICD-10-CM | POA: Insufficient documentation

## 2014-08-13 DIAGNOSIS — Z880 Allergy status to penicillin: Secondary | ICD-10-CM | POA: Diagnosis not present

## 2014-08-13 DIAGNOSIS — E119 Type 2 diabetes mellitus without complications: Secondary | ICD-10-CM | POA: Insufficient documentation

## 2014-08-13 DIAGNOSIS — I10 Essential (primary) hypertension: Secondary | ICD-10-CM | POA: Insufficient documentation

## 2014-08-13 DIAGNOSIS — Z79899 Other long term (current) drug therapy: Secondary | ICD-10-CM | POA: Diagnosis not present

## 2014-08-13 DIAGNOSIS — G4700 Insomnia, unspecified: Secondary | ICD-10-CM | POA: Insufficient documentation

## 2014-08-13 DIAGNOSIS — E669 Obesity, unspecified: Secondary | ICD-10-CM | POA: Insufficient documentation

## 2014-08-13 DIAGNOSIS — K219 Gastro-esophageal reflux disease without esophagitis: Secondary | ICD-10-CM | POA: Diagnosis not present

## 2014-08-13 LAB — COMPREHENSIVE METABOLIC PANEL
ALT: 18 U/L (ref 14–54)
AST: 21 U/L (ref 15–41)
Albumin: 4.2 g/dL (ref 3.5–5.0)
Alkaline Phosphatase: 64 U/L (ref 38–126)
Anion gap: 10 (ref 5–15)
BUN: 11 mg/dL (ref 6–20)
CO2: 26 mmol/L (ref 22–32)
Calcium: 9.6 mg/dL (ref 8.9–10.3)
Chloride: 103 mmol/L (ref 101–111)
Creatinine, Ser: 1.05 mg/dL — ABNORMAL HIGH (ref 0.44–1.00)
GFR calc Af Amer: >60 mL/min (ref >60)
GFR calc non Af Amer: 57 mL/min — ABNORMAL LOW (ref >60)
Glucose, Bld: 90 mg/dL (ref 70–99)
Potassium: 3.8 mmol/L (ref 3.5–5.1)
Sodium: 139 mmol/L (ref 135–145)
Total Bilirubin: 0.4 mg/dL (ref 0.3–1.2)
Total Protein: 8.2 g/dL — ABNORMAL HIGH (ref 6.5–8.1)

## 2014-08-13 LAB — CBG MONITORING, ED: Glucose-Capillary: 95 mg/dL (ref 70–99)

## 2014-08-13 LAB — CBC
HCT: 37.6 % (ref 36.0–46.0)
Hemoglobin: 11.8 g/dL — ABNORMAL LOW (ref 12.0–15.0)
MCH: 30.7 pg (ref 26.0–34.0)
MCHC: 31.4 g/dL (ref 30.0–36.0)
MCV: 97.9 fL (ref 78.0–100.0)
Platelets: 282 10*3/uL (ref 150–400)
RBC: 3.84 MIL/uL — ABNORMAL LOW (ref 3.87–5.11)
RDW: 14.5 % (ref 11.5–15.5)
WBC: 7 10*3/uL (ref 4.0–10.5)

## 2014-08-13 LAB — RAPID URINE DRUG SCREEN, HOSP PERFORMED
Amphetamines: NOT DETECTED
Barbiturates: NOT DETECTED
Benzodiazepines: NOT DETECTED
Cocaine: NOT DETECTED
Opiates: POSITIVE — AB
Tetrahydrocannabinol: NOT DETECTED

## 2014-08-13 LAB — ACETAMINOPHEN LEVEL: Acetaminophen (Tylenol), Serum: <10 ug/mL — ABNORMAL LOW (ref 10–30)

## 2014-08-13 LAB — SALICYLATE LEVEL: Salicylate Lvl: <4 mg/dL (ref 2.8–30.0)

## 2014-08-13 LAB — ETHANOL: Alcohol, Ethyl (B): <5 mg/dL (ref <5)

## 2014-08-13 MED ORDER — ZOLPIDEM TARTRATE 5 MG PO TABS
5.0000 mg | ORAL_TABLET | Freq: Every day | ORAL | Status: DC
  Administered 2014-08-13: 5 mg via ORAL
  Filled 2014-08-13: qty 1

## 2014-08-13 MED ORDER — BUPROPION HCL ER (XL) 300 MG PO TB24
450.0000 mg | ORAL_TABLET | Freq: Every morning | ORAL | Status: DC
  Administered 2014-08-14: 450 mg via ORAL
  Filled 2014-08-13: qty 1

## 2014-08-13 MED ORDER — HYDROCODONE-ACETAMINOPHEN 5-325 MG PO TABS
1.0000 | ORAL_TABLET | Freq: Two times a day (BID) | ORAL | Status: DC | PRN
  Administered 2014-08-14: 1 via ORAL
  Filled 2014-08-13: qty 1

## 2014-08-13 MED ORDER — ASPIRIN EC 81 MG PO TBEC
81.0000 mg | DELAYED_RELEASE_TABLET | Freq: Every evening | ORAL | Status: DC
  Administered 2014-08-13: 81 mg via ORAL
  Filled 2014-08-13 (×2): qty 1

## 2014-08-13 MED ORDER — ARIPIPRAZOLE 5 MG PO TABS
2.5000 mg | ORAL_TABLET | Freq: Every evening | ORAL | Status: DC
  Administered 2014-08-13: 2.5 mg via ORAL
  Filled 2014-08-13 (×2): qty 1

## 2014-08-13 MED ORDER — VALSARTAN 80 MG PO TABS
80.0000 mg | ORAL_TABLET | ORAL | Status: DC
  Administered 2014-08-13: 80 mg via ORAL
  Filled 2014-08-13 (×2): qty 1

## 2014-08-13 MED ORDER — AMLODIPINE BESYLATE 5 MG PO TABS
5.0000 mg | ORAL_TABLET | Freq: Every morning | ORAL | Status: DC
  Administered 2014-08-14: 5 mg via ORAL
  Filled 2014-08-13: qty 1

## 2014-08-13 MED ORDER — CLONAZEPAM 1 MG PO TABS
1.0000 mg | ORAL_TABLET | Freq: Two times a day (BID) | ORAL | Status: DC
  Administered 2014-08-13 – 2014-08-14 (×2): 1 mg via ORAL
  Filled 2014-08-13 (×2): qty 1

## 2014-08-13 MED ORDER — VALACYCLOVIR HCL 500 MG PO TABS
1000.0000 mg | ORAL_TABLET | Freq: Every day | ORAL | Status: DC
  Administered 2014-08-14: 1000 mg via ORAL
  Filled 2014-08-13: qty 2

## 2014-08-13 MED ORDER — IBUPROFEN 200 MG PO TABS: 600.0000 mg | ORAL_TABLET | Freq: Three times a day (TID) | ORAL | Status: DC | PRN

## 2014-08-13 MED ORDER — VALSARTAN 80 MG PO TABS: 80.0000 mg | ORAL_TABLET | ORAL | Status: DC

## 2014-08-13 MED ORDER — ACETAMINOPHEN 325 MG PO TABS: 650.0000 mg | ORAL_TABLET | ORAL | Status: DC | PRN

## 2014-08-13 MED ORDER — VALSARTAN-HYDROCHLOROTHIAZIDE 160-12.5 MG PO TABS: 0.5000 | ORAL_TABLET | ORAL | Status: DC

## 2014-08-13 MED ORDER — GABAPENTIN 300 MG PO CAPS
300.0000 mg | ORAL_CAPSULE | Freq: Every day | ORAL | Status: DC
  Administered 2014-08-13: 300 mg via ORAL
  Filled 2014-08-13: qty 1

## 2014-08-13 MED ORDER — PANTOPRAZOLE SODIUM 40 MG PO TBEC
40.0000 mg | DELAYED_RELEASE_TABLET | Freq: Two times a day (BID) | ORAL | Status: DC
  Administered 2014-08-13 – 2014-08-14 (×2): 40 mg via ORAL
  Filled 2014-08-13 (×2): qty 1

## 2014-08-13 MED ORDER — ALUM & MAG HYDROXIDE-SIMETH 200-200-20 MG/5ML PO SUSP: 30.0000 mL | ORAL | Status: DC | PRN

## 2014-08-13 MED ORDER — DULOXETINE HCL 60 MG PO CPEP
60.0000 mg | ORAL_CAPSULE | Freq: Two times a day (BID) | ORAL | Status: DC
  Administered 2014-08-13 – 2014-08-14 (×2): 60 mg via ORAL
  Filled 2014-08-13 (×3): qty 1

## 2014-08-13 MED ORDER — HYDROCHLOROTHIAZIDE 10 MG/ML ORAL SUSPENSION
6.2500 mg | ORAL | Status: DC
  Administered 2014-08-13: 6.25 mg via ORAL
  Filled 2014-08-13: qty 1.25

## 2014-08-13 MED ORDER — CALCIUM CARBONATE-VITAMIN D 500-200 MG-UNIT PO TABS
1.0000 | ORAL_TABLET | Freq: Every day | ORAL | Status: DC
  Administered 2014-08-14: 1 via ORAL
  Filled 2014-08-13: qty 1

## 2014-08-13 MED ORDER — HYDROCHLOROTHIAZIDE 10 MG/ML ORAL SUSPENSION: 6.2500 mg | ORAL | Status: DC

## 2014-08-13 MED ORDER — ALBUTEROL SULFATE HFA 108 (90 BASE) MCG/ACT IN AERS: 2.0000 | INHALATION_SPRAY | RESPIRATORY_TRACT | Status: DC | PRN

## 2014-08-13 MED ORDER — ROPINIROLE HCL 1 MG PO TABS
2.0000 mg | ORAL_TABLET | Freq: Two times a day (BID) | ORAL | Status: DC
  Administered 2014-08-13 – 2014-08-14 (×2): 2 mg via ORAL
  Filled 2014-08-13 (×3): qty 2

## 2014-08-13 MED ORDER — METFORMIN HCL 500 MG PO TABS
500.0000 mg | ORAL_TABLET | Freq: Two times a day (BID) | ORAL | Status: DC
  Administered 2014-08-14: 500 mg via ORAL
  Filled 2014-08-13 (×3): qty 1

## 2014-08-13 MED ORDER — ONDANSETRON HCL 4 MG PO TABS: 4.0000 mg | ORAL_TABLET | Freq: Three times a day (TID) | ORAL | Status: DC | PRN

## 2014-08-13 NOTE — ED Notes (Signed)
Pt's husband to her clothes and purse to the car.   Pt wanded by [employer].

## 2014-08-13 NOTE — ED Provider Notes (Signed)
CSN: [id number]     Arrival date & time 08/13/14  1620 History   First MD Initiated Contact with Patient 08/13/14 1708     Chief Complaint  Patient presents with  . Suicidal     (Consider location/radiation/quality/duration/timing/severity/associated sxs/prior Treatment) HPI Comments: Deanna Rowe is a 59 y.o. female with a PMHx of DM2, HTN, anxiety, depression, remote TIA, AVN L hip, GERD, urinary incontinence, and insomnia, who presents to the ED with complaints of suicidal ideations 7 days after she ran out of her Klonopin. She reports that yesterday it became worse and she had thoughts of hurting herself, therefore she gave her husband her pills because she was worried that she might overdose. Now she thinks her plan would be to cut her wrists. She has a history of prior suicide attempts. Her psychiatrist Dr. Reece Levy spoke to the patient and her husband over the phone, and advised them to bring her here for evaluation and to be safe overnight but that she has a spot at [facility] (or [facility]) tomorrow at [facility] but didn't have availability tonight. She denies use of any illicit drugs, although admits to taking chronic Vicodin for her chronic leg and back pain. Reports chronic nausea from achalasia which is unchanged. She denies any HI/AVH, alcohol use, or smoking. Denies any medical complaints including fevers or chills, chest pain, shortness breath, abdominal pain, vomiting, diarrhea, dysuria, hematuria, vaginal bleeding or discharge, numbness, tingling, weakness, headache, or vision changes.  Patient is a 59 y.o. female presenting with mental health disorder. The history is provided by the patient. No language interpreter was used.  Mental Health Problem Presenting symptoms: depression and suicidal thoughts   Presenting symptoms: no hallucinations and no homicidal ideas   Patient accompanied by:  Spouse Onset quality:  Gradual Duration:  7 days Timing:  Constant Progression:   Worsening Chronicity:  Recurrent Context: noncompliance   Treatment compliance:  Some of the time Time since last psychoactive medication taken:  5 days Relieved by:  None tried Worsened by:  Nothing tried Ineffective treatments:  None tried Associated symptoms: no abdominal pain, no chest pain and no headaches   Risk factors: hx of mental illness and hx of suicide attempts     Past Medical History  Diagnosis Date  . Diabetes mellitus   . Hypertension   . Obesity   . Anxiety   . Depression   . Diabetes mellitus, type II   . PONV (postoperative nausea and vomiting)   . Dry cough   . TIA (transient ischemic attack) 2012    no residual problems  . Arthritis     L HIP  . Avascular necrosis of hip     LEFT  . GERD (gastroesophageal reflux disease)   . Urgency incontinence   . Insomnia     takes Ambien nightly   Past Surgical History  Procedure Laterality Date  . Dilation and curettage of uterus    . Esophageal manometry N/A 09/03/2012    Procedure: ESOPHAGEAL MANOMETRY (EM);  Surgeon: Garlan Fair, MD;  Location[facility];  Service: Endoscopy;  Laterality: N/A;  . Joint replacement  2011    rt total hip  . Exploratory laparotomy    . Pilonidal cyst / sinus excision    . Total hip arthroplasty Left 10/09/2013    Procedure: LEFT TOTAL HIP ARTHROPLASTY ANTERIOR APPROACH;  Surgeon: [name], MD;  Location: [facility];  Service: Orthopedics;  Laterality: Left;   Family History  Problem  Relation Age of Onset  . Alcohol abuse Brother   . Alcohol abuse Brother   . Alcohol abuse Brother   . Alcohol abuse Brother    History  Substance Use Topics  . Smoking status: Never Smoker   . Smokeless tobacco: Never Used  . Alcohol Use: No   OB History    No data available     Review of Systems  Constitutional: Negative for fever and chills.  Eyes: Negative for visual disturbance.  Respiratory: Negative for shortness of breath.   Cardiovascular: Negative for chest pain.   Gastrointestinal: Positive for nausea (from achalasia, not acute, unchanged). Negative for vomiting and abdominal pain.  Genitourinary: Negative for dysuria, hematuria, vaginal bleeding and vaginal discharge.  Musculoskeletal: Negative for myalgias and arthralgias.  Skin: Negative for color change.  Allergic/Immunologic: Positive for immunocompromised state (diabetic).  Neurological: Negative for weakness, numbness and headaches.  Psychiatric/Behavioral: Positive for suicidal ideas. Negative for homicidal ideas, hallucinations and confusion.   10 Systems reviewed and are negative for acute change except as noted in the HPI.    Allergies  Codeine; Trulicity; Xanax; and Amoxicillin  Home Medications   Prior to Admission medications   Medication Sig Start Date End Date Taking? Authorizing Provider  albuterol (PROVENTIL HFA;VENTOLIN HFA) 108 (90 BASE) MCG/ACT inhaler Inhale 2 puffs into the lungs as needed for wheezing or shortness of breath (every 4 hours as needed for cough, wheezing and chest tightness).   Yes Historical Provider, MD  amLODipine (NORVASC) 5 MG tablet Take 5 mg by mouth every morning.    Yes Historical Provider, MD  ARIPiprazole (ABILIFY) 5 MG tablet Take 2.5 mg by mouth every evening.  03/09/12  Yes Leonides Grills, MD  aspirin EC 81 MG tablet Take 81 mg by mouth every evening.   Yes Historical Provider, MD  buPROPion (WELLBUTRIN XL) 150 MG 24 hr tablet Take 450 mg by mouth every morning.    Yes Historical Provider, MD  CALCIUM-VITAMIN D PO Take 1 tablet by mouth daily.   Yes Historical Provider, MD  ClonazePAM (KLONOPIN PO) Take 1 mg by mouth 2 (two) times daily. For anxiety   Yes Historical Provider, MD  DULoxetine (CYMBALTA) 60 MG capsule Take 60 mg by mouth 2 (two) times daily.    Yes Historical Provider, MD  gabapentin (NEURONTIN) 300 MG capsule Take 300 mg by mouth at bedtime.    Yes Historical Provider, MD  HYDROcodone-acetaminophen (NORCO/VICODIN) 5-325 MG per  tablet Take 1 tablet by mouth 2 (two) times daily as needed for moderate pain.  06/02/14  Yes Historical Provider, MD  metFORMIN (GLUCOPHAGE) 500 MG tablet Take by mouth 2 (two) times daily with a meal.   Yes Historical Provider, MD  pantoprazole (PROTONIX) 40 MG tablet Take 40 mg by mouth 2 (two) times daily.    Yes Historical Provider, MD  ROPINIRole HCl (REQUIP PO) Take 2 mg by mouth 2 (two) times daily.    Yes Historical Provider, MD  valACYclovir (VALTREX) 1000 MG tablet Take 1,000 mg by mouth daily.   Yes Historical Provider, MD  valsartan-hydrochlorothiazide (DIOVAN-HCT) 160-12.5 MG per tablet Take 0.5 tablets by mouth 3 (three) times a week. MWF   Yes Historical Provider, MD  VITAMIN D, ERGOCALCIFEROL, PO Take 1 capsule by mouth daily.   Yes Historical Provider, MD  zolpidem (AMBIEN) 5 MG tablet Take 5 mg by mouth at bedtime.    Yes Historical Provider, MD   BP 150/88 mmHg  Pulse 90  Temp(Src) 98.5  F (36.9 C) (Oral)  Resp 22  SpO2 99% Physical Exam  Constitutional: She is oriented to person, place, and time. Vital signs are normal. She appears well-developed and well-nourished.  Non-toxic appearance. No distress.  Afebrile, nontoxic, NAD  HENT:  Head: Normocephalic and atraumatic.  Mouth/Throat: Oropharynx is clear and moist and mucous membranes are normal.  Eyes: Conjunctivae and EOM are normal. Right eye exhibits no discharge. Left eye exhibits no discharge.  Neck: Normal range of motion. Neck supple.  Cardiovascular: Normal rate, regular rhythm, normal heart sounds and intact distal pulses.  Exam reveals no gallop and no friction rub.   No murmur heard. Pulmonary/Chest: Effort normal and breath sounds normal. No respiratory distress. She has no decreased breath sounds. She has no wheezes. She has no rhonchi. She has no rales.  Abdominal: Soft. Normal appearance and bowel sounds are normal. She exhibits no distension. There is no tenderness. There is no rigidity, no rebound, no  guarding, no CVA tenderness, no tenderness at McBurney's point and negative Murphy's sign.  Musculoskeletal: Normal range of motion.  Neurological: She is alert and oriented to person, place, and time. She has normal strength. No sensory deficit.  Skin: Skin is warm, dry and intact. No rash noted.  Psychiatric: Her speech is normal. She is not actively hallucinating. She exhibits a depressed mood. She expresses suicidal ideation. She expresses no homicidal ideation. She expresses suicidal plans. She expresses no homicidal plans.  Depressed affect, tearful, endorsing SI with a plan. No HI/AVH  Nursing note and vitals reviewed.   ED Course  Procedures (including critical care time) Labs Review Labs Reviewed  ACETAMINOPHEN LEVEL - Abnormal; Notable for the following:    Acetaminophen (Tylenol), Serum <10 (*)    All other components within normal limits  CBC - Abnormal; Notable for the following:    RBC 3.84 (*)    Hemoglobin 11.8 (*)    All other components within normal limits  COMPREHENSIVE METABOLIC PANEL - Abnormal; Notable for the following:    Creatinine, Ser 1.05 (*)    Total Protein 8.2 (*)    GFR calc non Af Amer 57 (*)    All other components within normal limits  URINE RAPID DRUG SCREEN (HOSP PERFORMED) - Abnormal; Notable for the following:    Opiates POSITIVE (*)    All other components within normal limits  ETHANOL  SALICYLATE LEVEL  CBG MONITORING, ED    Imaging Review No results found.   EKG Interpretation None      MDM   Final diagnoses:  Suicidal ideations    59 y.o. female here for SI with a plan. Her therapist Dr. Reece Levy stated that she has a spot at [facility] (or [name]?) tomorrow, will have assessment at 9am, but felt she needed to be evaluated tonight. No HI/AVH. No EtOH or illicit drugs. Will get screening labs and TTS consultation. She is here voluntarily now, will need IVC if she attempts to leave.  7:32 PM Screening labs returning with  no acute abnormalities (UDS with opiates but pt on chronic narcotics), but acetaminophen level has not yet resulted. Will call regarding this but will proceed with TTS consultation.   10:14 PM Acetaminophen level WNL. Pt medically cleared.  Dewitt Hoes Camprubi-Soms, PA-C 08/13/14 2214  Wandra Arthurs, MD 08/16/14 [phone]

## 2014-08-13 NOTE — ED Notes (Signed)
Pt assessed by TTS Izora Gala.

## 2014-08-13 NOTE — ED Notes (Signed)
Pt states that over the past several days she has been depressed and after running out of her Klonopin for 4-5 days thinks made thoughts and her depression worse.  Pt spoke with her therapist today and since [name] didn't have any availabilities today thought pt needed to come here for evaluation.  Pt states that she gave her husband her sleeping pills last night bc her thoughts of wanting to "end it all".  Pt states that she also could just cut her wrist or something like that since she didn't have her pills anymore.

## 2014-08-13 NOTE — BH Assessment (Signed)
Pt has been accepted to [facility] per [place]. She is to arrive after 9 am and will be under the care of Dr. Reece Levy, and should report to the [facility] building. Report to be called to [phone]. Latricia RN, informed.    Lear Ng, [facility] Triage Specialist 08/13/2014 11:27 PM

## 2014-08-13 NOTE — ED Notes (Signed)
Report to [facility]/ready for patient-will update vital signs prior to transport

## 2014-08-13 NOTE — BH Assessment (Signed)
Spoke with Isha from [facility] who reports they are currently at capacity but expecting discharges in the AM. She reports pt does not have a confirmed spot. She requests referral be sent for review.       Lear Ng, [facility] Triage Specialist 08/13/2014 9:53 PM

## 2014-08-13 NOTE — BHH Counselor (Signed)
TC from pt's counselor at [facility] [phone]. Tessie Fass reports that pt is also a pt of Dr Reece Levy. Tessie Fass says that pt has had intense urges to harm herself and Tessie Fass recommend that pt and pt's husband go directly to [facility].  Arnold Long, [place] [facility]rapeutic Triage Specialist

## 2014-08-13 NOTE — BH Assessment (Addendum)
Tele Assessment Note   Deanna Rowe is an 59 y.o. female. Presenting to ED at the suggestion of her psychiatrist and counselor, Dr. Reece Levy and Carollee Leitz from [facility]. Pt reports she has been treated for severe, recurrent bouts of depression since the death of her sister in 07/03/01. She reports had one past suicide attempt via overdose after losing her sister. She also reports she has had electroconvulsive therapy and it has caused her to have some trouble with recall about past events. 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 Pt reports for some months she had been having a very difficult time getting over the death of three friends from church who died suddenly in succession. She reports she was able to move past this when she thought about not needing "to grieve for the dead" due to her beliefs. She reports she was feeling good about having moved through that episode and then was hit with current depressive episode and has been upset about not being able to determine the cause. She reports last night she was having intense suicidal ideation, nagging thoughts to overdose. She gave pain medication and sleeping medication to her husband but when he fell asleep she thought of taking the pills she keeps on her dresser, but stubbed her toe on the way there and SO woke up. When access to pills was limited she then thought about going to get a knife. She reports her SI felt intense and urgent. Her SO was scared and called her psychiatrist who asked pt to go to [facility]. Pt did not want to do that, so she made a plan to see her counselor this morning. She saw her counselor who was concerned and suggested pt present to ED for safety. Pt reports she has a bed at [facility] per Dr. Reece Levy at [facility] on 08-14-14. Per [facility] staff, pt does not have an assigned bed and they are at capacity. They will review referral.   At the time of assessment pt was alert and oriented  times 4, with very depressed mood and congruent affect. Speech was logical and coherent. Some memory impairment reported. Pt reports strong SI starting yesterday and persisting until she was roomed in ED. She denies past hx of mania. She reports crying spells, feeling hopeless and helpless, and overwhelmed by sadness and intensity of SI.   Pt reports increase in panic attacks in the last week due to running out of her klonopin. She denies hx of abuse or other traumas. She reports losing her sister as very difficult for her.   Pt denies hx of tobacco, alcohol, or drug use. She reports her husband and daughters are supportive of her and she appreciates how patient her husband has become in dealing with her depression.    Family hx of paranoia (cousin) and older str having one incident of AH command to hurt herself. Pt reports her three brother struggle with etoh use problems.   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 Diabetes mellitus   . Hypertension   . Obesity   . Anxiety   . Depression   . Diabetes mellitus, type II   . PONV (postoperative nausea and vomiting)   . Dry cough   . TIA (transient ischemic attack) 07-04-10    no residual problems  .  Arthritis     L HIP  . Avascular necrosis of hip     LEFT  . GERD (gastroesophageal reflux disease)   . Urgency incontinence   . Insomnia     takes Ambien nightly    Past Surgical History  Procedure Laterality Date  . Dilation and curettage of uterus    . Esophageal manometry N/A 09/03/2012    Procedure: ESOPHAGEAL MANOMETRY (EM);  Surgeon: Garlan Fair, MD;  Location: [facility];  Service: Endoscopy;  Laterality: N/A;  . Joint replacement  2011    rt total hip  . Exploratory laparotomy    . Pilonidal cyst / sinus excision    . Total hip arthroplasty Left 10/09/2013    Procedure: LEFT  TOTAL HIP ARTHROPLASTY ANTERIOR APPROACH;  Surgeon: [name], MD;  Location: [facility];  Service: Orthopedics;  Laterality: Left;    Family History:  Family History  Problem Relation Age of Onset  . Alcohol abuse Brother   . Alcohol abuse Brother   . Alcohol abuse Brother   . Alcohol abuse Brother     Social History:  reports that she has never smoked. She has never used smokeless tobacco. She reports that she does not drink alcohol or use illicit drugs.  Additional Social History:  Alcohol / Drug Use Pain Medications: See PTA Prescriptions: SEE PTA reports she ran out of her klonopin about a week ago  Over the Counter: See PTA History of alcohol / drug use?: No history of alcohol / drug abuse Longest period of sobriety (when/how long): NA Negative Consequences of Use:  (NA) Withdrawal Symptoms:  (NA)  CIWA: CIWA-Ar BP: 155/98 mmHg Pulse Rate: 91 COWS:    PATIENT STRENGTHS: (choose at least two) Ability for insight Average or above average intelligence Communication skills Supportive family/friends  Allergies:  Allergies  Allergen Reactions  . Codeine Nausea And Vomiting  . Trulicity [Dulaglutide] Nausea And Vomiting    Significant and undesirably rapid (40 lbs) weight loss   . Xanax [Alprazolam] Other (See Comments)    disoriented  . Amoxicillin Rash    Home Medications:  (Not in a hospital admission)  OB/GYN Status:  No LMP recorded. Patient is postmenopausal.  General Assessment Data Location of Assessment: [facility] TTS Assessment: In system Is this a Tele or Face-to-Face Assessment?: Face-to-Face Is this an Initial Assessment or a Re-assessment for this encounter?: Initial Assessment Marital status: Married Is patient pregnant?: No Pregnancy Status: No Living Arrangements: Spouse/significant other Can pt return to current living arrangement?: Yes Admission Status: Voluntary Is patient capable of signing voluntary admission?: Yes Referral Source:  Psychiatrist Insurance type: [place] Living Arrangements: Spouse/significant other Name of Psychiatrist: Dr. Reece Levy  Name of Therapist: Carollee Leitz   Education Status Is patient currently in school?: No Current Grade: NA Highest grade of school patient has completed: Nursing school  Name of school: NA Contact person: NA  Risk to self with the past 6 months Suicidal Ideation: Yes-Currently Present Has patient been a risk to self within the past 6 months prior to admission? : Yes Suicidal Intent: Yes-Currently Present Has patient had any suicidal intent within the past 6 months prior to admission? : Yes Is patient at risk for suicide?: Yes Suicidal Plan?: Yes-Currently Present Has patient had any suicidal plan within the past 6 months prior to admission? : Yes Specify Current Suicidal Plan: overdose on medication or use a knife Access to Means: Yes Specify Access  to Suicidal Means: pt gave pain medications and sleep medication to spouse but reports she had others. She also reports she thought about getting knives in the kitchen  What has been your use of drugs/alcohol within the last 12 months?: none Previous Attempts/Gestures: Yes How many times?: 1 (after death of sister) Other Self Harm Risks: none Triggers for Past Attempts:  (loss of sister) Intentional Self Injurious Behavior: None Family Suicide History: Yes (reports sister had command for SI one time) Recent stressful life event(s): Other (Comment) (worsening of sx without known cause per pt) Persecutory voices/beliefs?: No Depression: Yes Depression Symptoms: Despondent, Insomnia, Tearfulness, Isolating, Loss of interest in usual pleasures, Feeling worthless/self pity, Guilt, Fatigue Substance abuse history and/or treatment for substance abuse?: No Suicide prevention information given to non-admitted patients:  (being admitted)  Risk to Others within the past 6 months Homicidal Ideation:  No Does patient have any lifetime risk of violence toward others beyond the six months prior to admission? : No Thoughts of Harm to Others: No Current Homicidal Intent: No Current Homicidal Plan: No Access to Homicidal Means: No Identified Victim: none History of harm to others?: No Assessment of Violence: None Noted Violent Behavior Description: none Does patient have access to weapons?: No Criminal Charges Pending?: No Does patient have a court date: No Is patient on probation?: No  Psychosis Hallucinations: None noted Delusions: None noted  Mental Status Report Appearance/Hygiene: Unremarkable Eye Contact: Good Motor Activity: Unremarkable Speech: Logical/coherent, Soft, Slow Level of Consciousness: Alert Mood: Depressed Affect: Appropriate to circumstance Anxiety Level: Moderate Thought Processes: Coherent, Relevant Judgement: Partial Orientation: Person, Place, Time, Situation Obsessive Compulsive Thoughts/Behaviors: None  Cognitive Functioning Concentration: Normal Memory: Recent Intact, Remote Impaired (reports she had ECTs and does not recall somethings) IQ: Average Insight: Good Impulse Control: Fair Appetite: Poor Weight Loss: 0 Weight Gain: 0 Sleep: Decreased Total Hours of Sleep: 4 (4-5 for past 3 weeks ) Vegetative Symptoms: None  ADLScreening [facility] Assessment Services) Patient's cognitive ability adequate to safely complete daily activities?: Yes Patient able to express need for assistance with ADLs?: Yes Independently performs ADLs?: Yes (appropriate for developmental age)  Prior Inpatient Therapy Prior Inpatient Therapy: Yes Prior Therapy Dates: pt does not recall due to ECT per her report Prior Therapy Facilty/Provider(s): [facility] and possibly others Reason for Treatment: depression   Prior Outpatient Therapy Prior Outpatient Therapy: Yes Prior Therapy Dates: current Prior Therapy Facilty/Provider(s): [facility], Dr. Reece Levy and Carollee Leitz Reason for Treatment: Depression, SI  Does patient have an ACCT team?: No Does patient have Intensive In-House Services?  : No Does patient have [facility] services? : No Does patient have P4CC services?: No  ADL Screening (condition at time of admission) Patient's cognitive ability adequate to safely complete daily activities?: Yes Is the patient deaf or have difficulty hearing?: No Does the patient have difficulty seeing, even when wearing glasses/contacts?: No Does the patient have difficulty concentrating, remembering, or making decisions?: Yes Patient able to express need for assistance with ADLs?: Yes Does the patient have difficulty dressing or bathing?: No Independently performs ADLs?: Yes (appropriate for developmental age) Does the patient have difficulty walking or climbing stairs?: Yes Weakness of Legs: Left Weakness of Arms/Hands: None  Home Assistive Devices/Equipment Home Assistive Devices/Equipment: Eyeglasses (hearing aids)    Abuse/Neglect Assessment (Assessment to be complete while patient is alone) Physical Abuse: Denies Verbal Abuse: Denies Sexual Abuse: Denies Exploitation of patient/patient's resources: Denies Self-Neglect: Denies Values / Beliefs Cultural Requests During Hospitalization: None Spiritual Requests During  Hospitalization: None   Advance Directives (For Healthcare) Does patient have an advance directive?: No Would patient like information on creating an advanced directive?: No - patient declined information    Additional Information 1:1 In Past 12 Months?: No CIRT Risk: No Elopement Risk: No Does patient have medical clearance?: Yes     Disposition:  Per Patriciaann Clan, PA pt meets inpt criteria. TTS to seek placement at [facility] due to existing relationship with Dr. Reece Levy and pt request. [facility] currently [facility] capacity per [facility] but will review referral for AM discharges. Informed Loralee Pacas of recommendations and she is  in agreement.  Disposition Initial Assessment Completed for this Encounter: Yes  Fadel Clason M 08/13/2014 10:03 PM

## 2014-08-13 NOTE — ED Notes (Signed)
Pt with history of depression, presents sad, tearful, depressed, experiencing strong thoughts of SI. Plan to take pills or cut wrists.  Pt reports she noticed she was laying on the couch more.  Pt reports she gave her husband her medications so she wouldn't hurt herself, but overwhelming thoughts of SI wouldn't go away.  Pt reports Dr Reece Levy suggested she go to [facility] for admission, there were no beds so pt came here for help.  Feeling hopeless, denies HI or AV hallucinations, but states she doesn't want to be at this facility.  Pt cooperative, tearful, no distress noted, monitoring for safety, Q 15 min checks in effect.

## 2014-08-13 NOTE — BH Assessment (Signed)
Review of ED notes prior to initiating assessment. Pt reports SI for the past 7 days and was suggested to come to ED by Dr. Reece Levy until inpt room available tomorrow morning.   Assessment to commence shortly.    Lear Ng, [facility] Triage Specialist 08/13/2014 9:22 PM

## 2014-08-14 DIAGNOSIS — F251 Schizoaffective disorder, depressive type: Secondary | ICD-10-CM | POA: Diagnosis not present

## 2014-08-14 DIAGNOSIS — R45851 Suicidal ideations: Secondary | ICD-10-CM | POA: Diagnosis present

## 2014-08-14 DIAGNOSIS — F315 Bipolar disorder, current episode depressed, severe, with psychotic features: Secondary | ICD-10-CM | POA: Diagnosis present

## 2014-08-14 DIAGNOSIS — M1990 Unspecified osteoarthritis, unspecified site: Secondary | ICD-10-CM | POA: Diagnosis not present

## 2014-08-14 DIAGNOSIS — I10 Essential (primary) hypertension: Secondary | ICD-10-CM | POA: Diagnosis present

## 2014-08-14 DIAGNOSIS — G2581 Restless legs syndrome: Secondary | ICD-10-CM | POA: Diagnosis present

## 2014-08-14 DIAGNOSIS — K219 Gastro-esophageal reflux disease without esophagitis: Secondary | ICD-10-CM | POA: Diagnosis present

## 2014-08-14 DIAGNOSIS — E669 Obesity, unspecified: Secondary | ICD-10-CM | POA: Diagnosis not present

## 2014-08-14 DIAGNOSIS — E119 Type 2 diabetes mellitus without complications: Secondary | ICD-10-CM | POA: Diagnosis present

## 2014-08-14 NOTE — ED Notes (Signed)
Patient discharged to [facility].  Left the unit ambulatory with [place] driver.  All belongings given to the driver.  She states she is feeling extremely hopeless and sad.

## 2014-08-15 ENCOUNTER — Ambulatory Visit: Payer: 59

## 2014-08-18 ENCOUNTER — Ambulatory Visit: Payer: 59 | Admitting: Physical Therapy

## 2014-08-19 DIAGNOSIS — M5136 Other intervertebral disc degeneration, lumbar region: Secondary | ICD-10-CM | POA: Diagnosis not present

## 2014-08-19 DIAGNOSIS — M5416 Radiculopathy, lumbar region: Secondary | ICD-10-CM | POA: Diagnosis not present

## 2014-08-19 DIAGNOSIS — M5126 Other intervertebral disc displacement, lumbar region: Secondary | ICD-10-CM | POA: Diagnosis not present

## 2014-08-20 ENCOUNTER — Ambulatory Visit: Payer: 59

## 2014-09-02 ENCOUNTER — Encounter: Payer: Self-pay | Admitting: Physical Therapy

## 2014-09-03 ENCOUNTER — Ambulatory Visit: Payer: Self-pay | Admitting: Pharmacist

## 2014-09-04 ENCOUNTER — Encounter: Payer: Self-pay | Admitting: Physical Therapy

## 2014-09-04 ENCOUNTER — Ambulatory Visit (INDEPENDENT_AMBULATORY_CARE_PROVIDER_SITE_OTHER): Payer: Self-pay | Admitting: Family Medicine

## 2014-09-04 ENCOUNTER — Ambulatory Visit: Payer: Self-pay | Admitting: Pharmacist

## 2014-09-04 VITALS — BP 133/83 | HR 88 | Ht 66.0 in | Wt 222.2 lb

## 2014-09-04 DIAGNOSIS — E119 Type 2 diabetes mellitus without complications: Secondary | ICD-10-CM

## 2014-09-04 LAB — POCT GLYCOSYLATED HEMOGLOBIN (HGB A1C): Hemoglobin A1C: 7.3

## 2014-09-04 NOTE — Progress Notes (Signed)
Subjective:  Patient presents today for 3 month diabetes follow-up as part of the employer-sponsored Link to Wellness program.  Current diabetes regimen includes metformin er 500mg  twice daily.  Patient also continues on daily ASA and ARB. Pt. Not currently taking a statin.  Most recent MD follow up was 07/11/14 with a pending appointment for mid July. Only medication changes: addition of hydroxyzine (to help with management of anxiety) and increase of Abilify (aripiprazole) from 2.5mg  daily to 5 mg daily.    Assessment/Plan:  Patient is a 59 yo female with DM 2. Most recent A1c was 7.3% today and 7.6% on 07/11/14, which is exceeding goal of less than 7%. Weight is inccreased from last visit as well from 214.8 to 222.2 lbs. Patient reports she has "had a hard time caring" about her diabetes. She has not made a concious effort to control her eating habits and she often eats when stressed.  Lifestyle improvements:  Physical Activity-  Back pain and depression have led to significant reduction (near elimination) of any activity.  Nutrition-  Diet has been poorly controlled for several months. She has not been controlling her carbohydrates or calories.  Follow up with me in 3 months.    Goals for Next Visit:  1. Contact Dr. Drema Dallas about considering addition of a statin. 2. Continue contact with Dr. Reece Levy and others in regards to behavioral health  Next appointment to see me is:  August 25th, @ 11AM

## 2014-09-05 NOTE — Progress Notes (Signed)
ATTENDING PHYSICIAN NOTE: I have reviewed the chart and agree with the plan as detailed above. Sena Hoopingarner MD Pager [phone]  

## 2014-09-10 DIAGNOSIS — F251 Schizoaffective disorder, depressive type: Secondary | ICD-10-CM | POA: Diagnosis not present

## 2014-09-17 DIAGNOSIS — M5126 Other intervertebral disc displacement, lumbar region: Secondary | ICD-10-CM | POA: Diagnosis not present

## 2014-09-30 DIAGNOSIS — M5032 Other cervical disc degeneration, mid-cervical region: Secondary | ICD-10-CM | POA: Diagnosis not present

## 2014-09-30 DIAGNOSIS — M542 Cervicalgia: Secondary | ICD-10-CM | POA: Diagnosis not present

## 2014-09-30 DIAGNOSIS — M5126 Other intervertebral disc displacement, lumbar region: Secondary | ICD-10-CM | POA: Diagnosis not present

## 2014-09-30 DIAGNOSIS — M5136 Other intervertebral disc degeneration, lumbar region: Secondary | ICD-10-CM | POA: Diagnosis not present

## 2014-10-03 DIAGNOSIS — Z471 Aftercare following joint replacement surgery: Secondary | ICD-10-CM | POA: Diagnosis not present

## 2014-10-03 DIAGNOSIS — M7072 Other bursitis of hip, left hip: Secondary | ICD-10-CM | POA: Diagnosis not present

## 2014-10-03 DIAGNOSIS — Z96641 Presence of right artificial hip joint: Secondary | ICD-10-CM | POA: Diagnosis not present

## 2014-10-03 DIAGNOSIS — Z96642 Presence of left artificial hip joint: Secondary | ICD-10-CM | POA: Diagnosis not present

## 2014-10-15 ENCOUNTER — Emergency Department (INDEPENDENT_AMBULATORY_CARE_PROVIDER_SITE_OTHER): Payer: 59

## 2014-10-15 ENCOUNTER — Encounter (HOSPITAL_COMMUNITY): Payer: Self-pay | Admitting: Emergency Medicine

## 2014-10-15 ENCOUNTER — Emergency Department (INDEPENDENT_AMBULATORY_CARE_PROVIDER_SITE_OTHER)
Admission: EM | Admit: 2014-10-15 | Discharge: 2014-10-15 | Disposition: A | Discharge status: Home / Self Care | Payer: 59 | Source: Home / Self Care | Attending: Family Medicine | Admitting: Family Medicine

## 2014-10-15 DIAGNOSIS — R05 Cough: Secondary | ICD-10-CM | POA: Diagnosis not present

## 2014-10-15 DIAGNOSIS — R059 Cough, unspecified: Secondary | ICD-10-CM

## 2014-10-15 MED ORDER — METHYLPREDNISOLONE ACETATE 80 MG/ML IJ SUSP
80.0000 mg | Freq: Once | INTRAMUSCULAR | Status: AC
  Administered 2014-10-15: 80 mg via INTRAMUSCULAR

## 2014-10-15 MED ORDER — METHYLPREDNISOLONE ACETATE 80 MG/ML IJ SUSP
INTRAMUSCULAR | Status: AC
  Filled 2014-10-15: qty 1

## 2014-10-15 NOTE — ED Provider Notes (Signed)
CSN: [id number]     Arrival date & time 10/15/14  1856 History   First MD Initiated Contact with Patient 10/15/14 1945     Chief Complaint  Patient presents with  . Cough   (Consider location/radiation/quality/duration/timing/severity/associated sxs/prior Treatment) Patient is a 59 y.o. female presenting with cough. The history is provided by the patient. No language interpreter was used.  Cough Cough characteristics:  Productive Sputum characteristics:  Nondescript Severity:  Moderate Onset quality:  Gradual Duration:  3 weeks Timing:  Constant Progression:  Worsening Chronicity:  New Smoker: no   Context: upper respiratory infection   Relieved by:  Nothing Worsened by:  Nothing tried Ineffective treatments:  None tried Associated symptoms: fever and myalgias    Pt reports she has been treated with zithromax and then levaquin.   Pt complains of continued shortness of breath and cough.  Pt also complains of diarrhea. Pt had two times a day x 3 days Past Medical History  Diagnosis Date  . Diabetes mellitus   . Hypertension   . Obesity   . Anxiety   . Depression   . Diabetes mellitus, type II   . PONV (postoperative nausea and vomiting)   . Dry cough   . TIA (transient ischemic attack) 2012    no residual problems  . Arthritis     L HIP  . Avascular necrosis of hip     LEFT  . GERD (gastroesophageal reflux disease)   . Urgency incontinence   . Insomnia     takes Ambien nightly   Past Surgical History  Procedure Laterality Date  . Dilation and curettage of uterus    . Esophageal manometry N/A 09/03/2012    Procedure: ESOPHAGEAL MANOMETRY (EM);  Surgeon: Garlan Fair, MD;  Location: [facility];  Service: Endoscopy;  Laterality: N/A;  . Joint replacement  2011    rt total hip  . Exploratory laparotomy    . Pilonidal cyst / sinus excision    . Total hip arthroplasty Left 10/09/2013    Procedure: LEFT TOTAL HIP ARTHROPLASTY ANTERIOR APPROACH;  Surgeon: [name], MD;  Location: [facility];  Service: Orthopedics;  Laterality: Left;   Family History  Problem Relation Age of Onset  . Alcohol abuse Brother   . Alcohol abuse Brother   . Alcohol abuse Brother   . Alcohol abuse Brother    History  Substance Use Topics  . Smoking status: Never Smoker   . Smokeless tobacco: Never Used  . Alcohol Use: No   OB History    No data available     Review of Systems  Constitutional: Positive for fever.  Respiratory: Positive for cough.   Musculoskeletal: Positive for myalgias.  All other systems reviewed and are negative.   Allergies  Codeine; Trulicity; Xanax; and Amoxicillin  Home Medications   Prior to Admission medications   Medication Sig Start Date End Date Taking? Authorizing Provider  LevoFLOXacin (LEVAQUIN PO) Take by mouth.   Yes Historical Provider, MD  albuterol (PROVENTIL HFA;VENTOLIN HFA) 108 (90 BASE) MCG/ACT inhaler Inhale 2 puffs into the lungs as needed for wheezing or shortness of breath (every 4 hours as needed for cough, wheezing and chest tightness).    Historical Provider, MD  amLODipine (NORVASC) 5 MG tablet Take 5 mg by mouth every morning.     Historical Provider, MD  ARIPiprazole (ABILIFY) 5 MG tablet Take 5 mg by mouth every evening.  03/09/12   Leonides Grills, MD  aspirin EC  81 MG tablet Take 81 mg by mouth every evening.    Historical Provider, MD  buPROPion (WELLBUTRIN XL) 150 MG 24 hr tablet Take 450 mg by mouth every morning.     Historical Provider, MD  CALCIUM-VITAMIN D PO Take 1 tablet by mouth daily.    Historical Provider, MD  ClonazePAM (KLONOPIN PO) Take 1 mg by mouth 2 (two) times daily. For anxiety    Historical Provider, MD  DULoxetine (CYMBALTA) 60 MG capsule Take 60 mg by mouth 2 (two) times daily.     Historical Provider, MD  gabapentin (NEURONTIN) 300 MG capsule Take 300 mg by mouth at bedtime.     Historical Provider, MD  hydrOXYzine (VISTARIL) 25 MG capsule Take 25 mg by mouth 2 (two)  times daily as needed.    Historical Provider, MD  metFORMIN (GLUCOPHAGE) 500 MG tablet Take by mouth 2 (two) times daily with a meal.    Historical Provider, MD  pantoprazole (PROTONIX) 40 MG tablet Take 40 mg by mouth 2 (two) times daily.     Historical Provider, MD  ROPINIRole HCl (REQUIP PO) Take 2 mg by mouth 2 (two) times daily.     Historical Provider, MD  valACYclovir (VALTREX) 1000 MG tablet Take 1,000 mg by mouth daily.    Historical Provider, MD  valsartan-hydrochlorothiazide (DIOVAN-HCT) 160-12.5 MG per tablet Take 0.5 tablets by mouth 3 (three) times a week. MWF    Historical Provider, MD  VITAMIN D, ERGOCALCIFEROL, PO Take 1 capsule by mouth daily.    Historical Provider, MD  zolpidem (AMBIEN) 5 MG tablet Take 5 mg by mouth at bedtime.     Historical Provider, MD   BP 149/92 mmHg  Pulse 89  Temp(Src) 98.8 F (37.1 C) (Oral)  Resp 16  SpO2 99% Physical Exam  Constitutional: She is oriented to person, place, and time. She appears well-developed and well-nourished.  HENT:  Head: Normocephalic.  Eyes: Conjunctivae and EOM are normal. Pupils are equal, round, and reactive to light.  Neck: Normal range of motion.  Cardiovascular: Normal rate and normal heart sounds.   Pulmonary/Chest: Effort normal and breath sounds normal.  Abdominal: Soft. She exhibits no distension.  Musculoskeletal: Normal range of motion.  Neurological: She is alert and oriented to person, place, and time.  Skin: Skin is warm.  Psychiatric: She has a normal mood and affect.  Nursing note and vitals reviewed.   ED Course  Procedures (including critical care time) Labs Review Labs Reviewed - No data to display  Imaging Review Dg Chest 2 View  10/15/2014   CLINICAL DATA:  Shortness of breath and cough for 2 weeks  EXAM: CHEST - 2 VIEW  COMPARISON:  06/18/2013  FINDINGS: The heart size and mediastinal contours are within normal limits. Both lungs are clear. The visualized skeletal structures are  unremarkable.  IMPRESSION: No active disease.   Electronically Signed   By: Inez Catalina M.D.   On: 10/15/2014 20:19     MDM   1. Cough    Pt advised stop levaquin.   Pt given depomedrol IM.   Pt advised to see her Md for recheck    Fransico Meadow, [place] 10/15/14 2034

## 2014-10-15 NOTE — Discharge Instructions (Signed)
Cough, Adult   A cough is a reflex. It helps you clear your throat and airways. A cough can help heal your body. A cough can last 2 or 3 weeks (acute) or may last more than 8 weeks (chronic). Some common causes of a cough can include an infection, allergy, or a cold.  HOME CARE  · Only take medicine as told by your doctor.  · If given, take your medicines (antibiotics) as told. Finish them even if you start to feel better.  · Use a cold steam vaporizer or humidifier in your home. This can help loosen thick spit (secretions).  · Sleep so you are almost sitting up (semi-upright). Use pillows to do this. This helps reduce coughing.  · Rest as needed.  · Stop smoking if you smoke.  GET HELP RIGHT AWAY IF:  · You have yellowish-white fluid (pus) in your thick spit.  · Your cough gets worse.  · Your medicine does not reduce coughing, and you are losing sleep.  · You cough up blood.  · You have trouble breathing.  · Your pain gets worse and medicine does not help.  · You have a fever.  MAKE SURE YOU:   · Understand these instructions.  · Will watch your condition.  · Will get help right away if you are not doing well or get worse.  Document Released: 12/02/2010 Document Revised: 08/05/2013 Document Reviewed: 12/02/2010  ExitCare® Patient Information ©2015 [employer]. This information is not intended to replace advice given to you by your health care provider. Make sure you discuss any questions you have with your health care provider.

## 2014-10-15 NOTE — ED Notes (Signed)
Patient complains of cough, clear phlegm, thick phlegm.  Patient has had this cough for 2 weeks.  [facility] initially was prescribed azithromycin, then was prescribed levaquin.  Patient now has n/v/d  Second visit involved ekg, arm pain , and sob and was told everything was normal.

## 2014-10-29 ENCOUNTER — Telehealth: Payer: Self-pay | Admitting: Cardiology

## 2014-10-29 NOTE — Telephone Encounter (Signed)
Received records from [facility] for appointment on 12/16/14 with Dr Ellyn Hack.  Records given to [facility] (medical records) for Dr Allison Quarry schedule on 12/16/14.  lp

## 2014-10-29 NOTE — Progress Notes (Signed)
This encounter was created in error - please disregard.

## 2014-10-31 ENCOUNTER — Ambulatory Visit
Admission: RE | Admit: 2014-10-31 | Discharge: 2014-10-31 | Disposition: A | Discharge status: Home / Self Care | Payer: 59 | Source: Ambulatory Visit | Attending: Family Medicine | Admitting: Family Medicine

## 2014-10-31 ENCOUNTER — Other Ambulatory Visit: Payer: Self-pay | Admitting: Family Medicine

## 2014-10-31 DIAGNOSIS — M4722 Other spondylosis with radiculopathy, cervical region: Secondary | ICD-10-CM | POA: Diagnosis not present

## 2014-10-31 DIAGNOSIS — M79602 Pain in left arm: Secondary | ICD-10-CM

## 2014-11-11 DIAGNOSIS — M7062 Trochanteric bursitis, left hip: Secondary | ICD-10-CM | POA: Diagnosis not present

## 2014-11-25 ENCOUNTER — Encounter: Payer: Self-pay | Admitting: Endocrinology

## 2014-11-25 ENCOUNTER — Ambulatory Visit (INDEPENDENT_AMBULATORY_CARE_PROVIDER_SITE_OTHER): Payer: 59 | Admitting: Endocrinology

## 2014-11-25 VITALS — BP 136/82 | HR 89 | Temp 98.0°F | Ht 66.0 in | Wt 215.0 lb

## 2014-11-25 DIAGNOSIS — E119 Type 2 diabetes mellitus without complications: Secondary | ICD-10-CM | POA: Diagnosis not present

## 2014-11-25 MED ORDER — LINAGLIPTIN 5 MG PO TABS: 5.0000 mg | ORAL_TABLET | Freq: Every day | ORAL | Status: DC

## 2014-11-25 NOTE — Patient Instructions (Signed)
Please sign a release of information, to make sure your thyroid has been checked recently--I think it has but I want to make sure. good diet and exercise significantly improve the control of your diabetes.  please let me know if you wish to be referred to a dietician.  high blood sugar is very risky to your health.  you should see an eye doctor and dentist every year.  It is very important to get all recommended vaccinations.  controlling your blood pressure and cholesterol drastically reduces the damage diabetes does to your body.  Those who smoke should quit.  please discuss these with your doctor.  At our office, we are fortunate to have two specialists who are happy to help you:   Leonia Reader, RN, CDE, is a diabetes educator.  She is here on Monday mornings, and all day Tuesday and Wednesday.  She is can help you with low blood sugar avoidance and treatment, injecting insulin, sick day management, and others.   Antonieta Iba, RD is our dietician.  She is here all day Thursday and Friday.  She can advise you about a healthy diet.  She can also help you about a variety of special diabetes situations, such as shift work, Actor, gluten-free, diet for kidney patients, traveling with diabetes, and help for those who need to gain weight.   check your blood sugar once a day.  vary the time of day when you check, between before the 3 meals, and at bedtime.  also check if you have symptoms of your blood sugar being too high or too low.  please keep a record of the readings and bring it to your next appointment here.  You can write it on any piece of paper.  please call us sooner if your blood sugar goes below 70, or if you have a lot of readings over 200.   Please come back for a follow-up appointment in 3 months.

## 2014-11-25 NOTE — Progress Notes (Signed)
Subjective:    Patient ID: Deanna Rowe, female    DOB: [birth year], 59 y.o.   MRN: [id number]  HPI pt states DM was dx'ed in 2012; she has mild if any neuropathy of the lower extremities, but she has associated renal insufficiency; she has never been on insulin; pt says her diet is good, but exercise is limited by hip pain; she has never had GDM, pancreatitis, severe hypoglycemia or DKA.   Past Medical History  Diagnosis Date  . Diabetes mellitus   . Hypertension   . Obesity   . Anxiety   . Depression   . Diabetes mellitus, type II   . PONV (postoperative nausea and vomiting)   . Dry cough   . TIA (transient ischemic attack) 2012    no residual problems  . Arthritis     L HIP  . Avascular necrosis of hip     LEFT  . GERD (gastroesophageal reflux disease)   . Urgency incontinence   . Insomnia     takes Ambien nightly    Past Surgical History  Procedure Laterality Date  . Dilation and curettage of uterus    . Esophageal manometry N/A 09/03/2012    Procedure: ESOPHAGEAL MANOMETRY (EM);  Surgeon: Garlan Fair, MD;  Location: [facility];  Service: Endoscopy;  Laterality: N/A;  . Joint replacement  2011    rt total hip  . Exploratory laparotomy    . Pilonidal cyst / sinus excision    . Total hip arthroplasty Left 10/09/2013    Procedure: LEFT TOTAL HIP ARTHROPLASTY ANTERIOR APPROACH;  Surgeon: [name], MD;  Location: [facility];  Service: Orthopedics;  Laterality: Left;    Social History   Social History  . Marital Status: Married    Spouse Name: N/A  . Number of Children: N/A  . Years of Education: N/A   Occupational History  . Not on file.   Social History Main Topics  . Smoking status: Never Smoker   . Smokeless tobacco: Never Used  . Alcohol Use: No  . Drug Use: No  . Sexual Activity: Not Currently   Other Topics Concern  . Not on file   Social History Narrative    Current Outpatient Prescriptions on File Prior to Visit  Medication Sig  Dispense Refill  . albuterol (PROVENTIL HFA;VENTOLIN HFA) 108 (90 BASE) MCG/ACT inhaler Inhale 2 puffs into the lungs as needed for wheezing or shortness of breath (every 4 hours as needed for cough, wheezing and chest tightness).    Marland Kitchen amLODipine (NORVASC) 5 MG tablet Take 5 mg by mouth every morning.     . ARIPiprazole (ABILIFY) 5 MG tablet Take 5 mg by mouth every evening.     Marland Kitchen aspirin EC 81 MG tablet Take 81 mg by mouth every evening.    Marland Kitchen buPROPion (WELLBUTRIN XL) 150 MG 24 hr tablet Take 450 mg by mouth every morning.     Marland Kitchen CALCIUM-VITAMIN D PO Take 1 tablet by mouth daily.    . ClonazePAM (KLONOPIN PO) Take 1 mg by mouth 2 (two) times daily. For anxiety    . gabapentin (NEURONTIN) 300 MG capsule Take 300 mg by mouth at bedtime.     . hydrOXYzine (VISTARIL) 25 MG capsule Take 25 mg by mouth 2 (two) times daily as needed.    . metFORMIN (GLUCOPHAGE) 500 MG tablet Take by mouth 2 (two) times daily with a meal.    . pantoprazole (PROTONIX) 40 MG tablet Take 40  mg by mouth 2 (two) times daily.     Marland Kitchen ROPINIRole HCl (REQUIP PO) Take 2 mg by mouth 2 (two) times daily.     . valACYclovir (VALTREX) 1000 MG tablet Take 1,000 mg by mouth daily.    . valsartan-hydrochlorothiazide (DIOVAN-HCT) 160-12.5 MG per tablet Take 0.5 tablets by mouth 3 (three) times a week. MWF    . VITAMIN D, ERGOCALCIFEROL, PO Take 1 capsule by mouth daily.    Marland Kitchen zolpidem (AMBIEN) 5 MG tablet Take 5 mg by mouth at bedtime.     . DULoxetine (CYMBALTA) 60 MG capsule Take 60 mg by mouth 2 (two) times daily.      Current Facility-Administered Medications on File Prior to Visit  Medication Dose Route Frequency Provider Last Rate Last Dose  . tranexamic acid (CYKLOKAPRON) topical -INTRAOP  2,000 mg Topical Once [employer], PA-C        Allergies  Allergen Reactions  . Codeine Nausea And Vomiting  . Trulicity [Dulaglutide] Nausea And Vomiting    Significant and undesirably rapid (40 lbs) weight loss   . Xanax [Alprazolam]  Other (See Comments)    disoriented  . Amoxicillin Rash    Family History  Problem Relation Age of Onset  . Alcohol abuse Brother   . Alcohol abuse Brother   . Alcohol abuse Brother   . Alcohol abuse Brother   . Diabetes Neg Hx     BP 136/82 mmHg  Pulse 89  Temp(Src) 98 F (36.7 C) (Oral)  Ht 5\' 6"  (1.676 m)  Wt 215 lb (97.523 kg)  BMI 34.72 kg/m2  SpO2 96%   Review of Systems denies blurry vision, headache, chest pain, n/v, urinary frequency, muscle cramps, depression, cold intolerance, rhinorrhea, and easy bruising.  she has chronic hearing loss, doe, depression, excessive diaphoresis, and weight gain.      Objective:   Physical Exam VS: see vs page GEN: no distress HEAD: head: no deformity eyes: no periorbital swelling, no proptosis external nose and ears are normal mouth: no lesion seen NECK: supple, thyroid is not enlarged CHEST WALL: no deformity LUNGS:  Clear to auscultation.   CV: reg rate and rhythm, no murmur ABD: abdomen is soft, nontender.  no hepatosplenomegaly.  not distended.  no hernia MUSCULOSKELETAL: muscle bulk and strength are grossly normal.  no obvious joint swelling.  gait is normal and steady EXTEMITIES: no deformity.  no ulcer on the feet.  feet are of normal color and temp.  no edema PULSES: dorsalis pedis intact bilat.  no carotid bruit NEURO:  cn 2-12 grossly intact.   readily moves all 4's.  sensation is intact to touch on the feet SKIN:  Normal texture and temperature.  No rash or suspicious lesion is visible.   NODES:  None palpable at the neck PSYCH: alert, well-oriented.  Does not appear anxious nor depressed.  i personally reviewed electrocardiogram tracing (02/06/13) sinus tachycardia.   outside test results are reviewed: A1c=7.9%  i have reviewed outside records: Pt was seen to f/u DM.  On metformin, a1c was high, and pt was ref here.     Assessment & Plan:  DM: new to me: she needs increased rx.  i have sent a prescription  to your pharmacy, to add tradjenta.  Patient is advised the following: Patient Instructions  Please sign a release of information, to make sure your thyroid has been checked recently--I think it has but I want to make sure. good diet and exercise significantly improve the control  of your diabetes.  please let me know if you wish to be referred to a dietician.  high blood sugar is very risky to your health.  you should see an eye doctor and dentist every year.  It is very important to get all recommended vaccinations.  controlling your blood pressure and cholesterol drastically reduces the damage diabetes does to your body.  Those who smoke should quit.  please discuss these with your doctor.  At our office, we are fortunate to have two specialists who are happy to help you:   Leonia Reader, RN, CDE, is a diabetes educator.  She is here on Monday mornings, and all day Tuesday and Wednesday.  She is can help you with low blood sugar avoidance and treatment, injecting insulin, sick day management, and others.   Antonieta Iba, RD is our dietician.  She is here all day Thursday and Friday.  She can advise you about a healthy diet.  She can also help you about a variety of special diabetes situations, such as shift work, Actor, gluten-free, diet for kidney patients, traveling with diabetes, and help for those who need to gain weight.   check your blood sugar once a day.  vary the time of day when you check, between before the 3 meals, and at bedtime.  also check if you have symptoms of your blood sugar being too high or too low.  please keep a record of the readings and bring it to your next appointment here.  You can write it on any piece of paper.  please call us sooner if your blood sugar goes below 70, or if you have a lot of readings over 200.   Please come back for a follow-up appointment in 3 months.

## 2014-11-27 ENCOUNTER — Ambulatory Visit (INDEPENDENT_AMBULATORY_CARE_PROVIDER_SITE_OTHER): Payer: Self-pay | Admitting: Family Medicine

## 2014-11-27 VITALS — BP 139/76 | HR 83 | Ht 66.0 in | Wt 219.0 lb

## 2014-11-27 DIAGNOSIS — E119 Type 2 diabetes mellitus without complications: Secondary | ICD-10-CM

## 2014-11-27 NOTE — Progress Notes (Signed)
Subjective:  Patient presents today for 3 month diabetes follow-up as part of the employer-sponsored Link to Wellness program.  Current diabetes regimen includes metformin 1,000mg  twice daily, tradjenta 5mg  (starting today). Patient also continues on daily ASA 81mg  EC and valsartan/hctz. No statin currently. Most recent visit was with endo 2 days ago (11/25/14), pending appointment and labwork with primary care in October. Recent addition of Tradjenta, Trintellix and increase of metformin; also recent discontinuation of Cymbalta (duloxetine).   Assessment/Plan:  Patient is a 59 y.o. female with DM 2. Most recent A1C was  7.9% which is exceeding goal of 7.0%. Weight is decreased 3.2 lbs from last visit with me.   Lifestyle improvements:  Physical Activity-  Has increased activity. Previous visit she was completely inactive (mainly attributable to severe depression). Recently restarted mall walking with husband. To increase to three times weekly.  Nutrition-  Recent improvement in sugar and carbohydrate consumption. More able to stay away from carbs and care about what she's eating.   Follow up with me in 3 months.    Goals for Next Visit:  1. Follow up with Dr. Drema Dallas about initiating a statin. It may be appropriate for you. 2. Keep up the continued improvement of diet. 3. Continue to improve exercise. Very excited to hear you have restarted mall walking. Keep up the pace and the frequency of doing it.    Next appointment to see me is: 02/19/15 @ 11AM.

## 2014-11-28 ENCOUNTER — Other Ambulatory Visit: Payer: Self-pay | Admitting: Gastroenterology

## 2014-11-28 DIAGNOSIS — R4702 Dysphasia: Secondary | ICD-10-CM

## 2014-11-28 DIAGNOSIS — K220 Achalasia of cardia: Secondary | ICD-10-CM | POA: Diagnosis not present

## 2014-11-28 DIAGNOSIS — R1310 Dysphagia, unspecified: Secondary | ICD-10-CM | POA: Diagnosis not present

## 2014-12-02 ENCOUNTER — Ambulatory Visit
Admission: RE | Admit: 2014-12-02 | Discharge: 2014-12-02 | Disposition: A | Discharge status: Home / Self Care | Payer: 59 | Source: Ambulatory Visit | Attending: Gastroenterology | Admitting: Gastroenterology

## 2014-12-02 DIAGNOSIS — K228 Other specified diseases of esophagus: Secondary | ICD-10-CM | POA: Diagnosis not present

## 2014-12-02 DIAGNOSIS — R4702 Dysphasia: Secondary | ICD-10-CM

## 2014-12-10 NOTE — Progress Notes (Signed)
ATTENDING PHYSICIAN NOTE: I have reviewed the chart and agree with the plan as detailed above. Sui Kasparek MD Pager [phone]  

## 2014-12-16 ENCOUNTER — Ambulatory Visit: Payer: Self-pay | Admitting: Cardiology

## 2014-12-25 ENCOUNTER — Encounter: Payer: Self-pay | Admitting: Rehabilitation

## 2014-12-25 ENCOUNTER — Ambulatory Visit: Payer: 59 | Attending: Physical Medicine and Rehabilitation | Admitting: Rehabilitation

## 2014-12-25 DIAGNOSIS — M25552 Pain in left hip: Secondary | ICD-10-CM | POA: Insufficient documentation

## 2014-12-25 DIAGNOSIS — R262 Difficulty in walking, not elsewhere classified: Secondary | ICD-10-CM | POA: Insufficient documentation

## 2014-12-25 NOTE — Therapy (Signed)
[facility] [address]  [place], [place], [address] Phone: [phone]   Fax:  [phone]  Physical Therapy Evaluation  Patient Details  Name: Deanna Rowe MRN: [id number] Date of Birth: 02-14-1956 Referring Felicitas Sine:  Rod Can, MD  Encounter Date: 12/25/2014      PT End of Session - 12/25/14 1356    Visit Number 1   Number of Visits 8   Date for PT Re-Evaluation 01/22/15   PT Start Time 8502   PT Stop Time 1356   PT Time Calculation (min) 41 min   Activity Tolerance Patient tolerated treatment well      Past Medical History  Diagnosis Date  . Diabetes mellitus   . Hypertension   . Obesity   . Anxiety   . Depression   . Diabetes mellitus, type II   . PONV (postoperative nausea and vomiting)   . Dry cough   . TIA (transient ischemic attack) 2012    no residual problems  . Arthritis     L HIP  . Avascular necrosis of hip     LEFT  . GERD (gastroesophageal reflux disease)   . Urgency incontinence   . Insomnia     takes Ambien nightly    Past Surgical History  Procedure Laterality Date  . Dilation and curettage of uterus    . Esophageal manometry N/A 09/03/2012    Procedure: ESOPHAGEAL MANOMETRY (EM);  Surgeon: Garlan Fair, MD;  Location: [facility];  Service: Endoscopy;  Laterality: N/A;  . Joint replacement  2011    rt total hip  . Exploratory laparotomy    . Pilonidal cyst / sinus excision    . Total hip arthroplasty Left 10/09/2013    Procedure: LEFT TOTAL HIP ARTHROPLASTY ANTERIOR APPROACH;  Surgeon: [name], MD;  Location: [facility];  Service: Orthopedics;  Laterality: Left;    There were no vitals filed for this visit.  Visit Diagnosis:  Hip pain, left  Difficulty walking      Subjective Assessment - 12/25/14 1317    Subjective Pt presents with c/o L lateral hip pain that she thought was coming from her back.  The back MD told her it was most likely bursitis.   Her orthopedic MD verified it was bursitis and received an injection that helped x 4 weeks and now the pain has turned.  Md wanted her to now try PT.     Pertinent History L THR in 2015 which is in good status   Limitations Walking  sleeping   How long can you walk comfortably? pain with every step; has to sit down after 94minutes   Patient Stated Goals reduce pain, improve walking to start exercising, improve getting in and out fo the car   Currently in Pain? Yes   Pain Score 6    Pain Location Hip   Pain Orientation Lateral;Left   Pain Descriptors / Indicators Sharp;Stabbing   Pain Type Acute pain  8 weeks   Aggravating Factors  walking, lying on that side   Pain Relieving Factors rest            [facility] PT Assessment - 12/25/14 0001    Assessment   Medical Diagnosis L trochanteric bursitis   Prior Therapy not for this   Precautions   Precautions None   Restrictions   Weight Bearing Restrictions No   Balance Screen   Has the patient fallen in the past 6 months  No   Home Environment   Living Environment Private residence   Additional Comments stairs at home that are painful; avoiding going up with the L leg   Prior Function   Level of Independence Independent   Observation/Other Assessments   Observations L leg longer in standing verified by back surgeon   Focus on Therapeutic Outcomes (FOTO)  FOTO 72% limited   ROM / Strength   AROM / PROM / Strength PROM;AROM;Strength   AROM   AROM Assessment Site Hip   Right/Left Hip Left   Strength   Strength Assessment Site Hip;Knee   Right/Left Hip Right;Left   Right Hip Flexion 4+/5   Right Hip External Rotation  4+/5   Right Hip Internal Rotation 4+/5   Right Hip ABduction 4/5   Right Hip ADduction 4/5   Left Hip Flexion 4-/5   Left Hip External Rotation 4-/5   Left Hip Internal Rotation 4-/5  painful at greater trochanter   Left Hip ADduction 4/5   Right/Left Knee Left;Right   Right Knee Flexion 5/5   Right Knee  Extension 5/5   Left Knee Flexion 4/5   Left Knee Extension 5/5   Flexibility   Soft Tissue Assessment /Muscle Length yes   Hamstrings all movements at the L hip are decreased due to hip replacement    Piriformis pain with KTOS    Palpation   Palpation comment +2-3 ttp L greater trochanter   Ambulation/Gait   Gait Comments still has trendelenburg gait present with L leg stance after THR   High Level Balance   High Level Balance Comments R SLstance: 6" with hip drop, L: 5"                   [facility] Adult PT Treatment/Exercise - 12/25/14 0001    Modalities   Modalities Iontophoresis   Iontophoresis   Type of Iontophoresis Dexamethasone   Location L greater trochanter   Time 6hr wear                PT Education - 12/25/14 1356    Education provided Yes   Education Details HEP, POC, Ionto use and side effects, to use cane again in R UE   Person(s) Educated Patient   Methods Explanation;Handout;Demonstration   Comprehension Verbalized understanding;Returned demonstration          PT Short Term Goals - 12/25/14 1804    PT SHORT TERM GOAL #1   Title Independent with inital HEP   Time 2   Period Weeks   Status New           PT Long Term Goals - 12/25/14 1804    PT LONG TERM GOAL #1   Title She will be independent with all hEP issued as of last visit   Time 4   Period Weeks   Status New   PT LONG TERM GOAL #2   Title Pt will return to baseline of no lateral L hip pain   Time 4   Period Weeks   Status New   PT LONG TERM GOAL #3   Title pt will return to walking with husband for exercises without limitations due to hip pain   Time 4   Period Weeks   Status New               Plan - 12/25/14 1357    Clinical Impression Statement Pt presents with L greater trochanteric bursitis after new onset of back related pain with L  LE referral.  Back pain is now resolved with injection but has remaining hip pain.  Bursitis pain precipitated by  residual hip weakness after THR and decreased ROM afgter THR.  Pt still exhibits trendelenberg gait with leg length descripancy.  Pt has received a heel lift from MD but is concerned about the fit so pt may bring it with her to treatment.     Pt will benefit from skilled therapeutic intervention in order to improve on the following deficits Abnormal gait;Pain;Decreased strength;Difficulty walking   Rehab Potential Good   PT Frequency 2x / week   PT Duration 4 weeks   PT Treatment/Interventions Cryotherapy;Electrical Stimulation;Iontophoresis 4mg /ml Dexamethasone;Moist Heat;Ultrasound;Gait training;Therapeutic exercise;Therapeutic activities;Balance training;Neuromuscular re-education;Manual techniques;Taping;Patient/family education  [name] pulsed only due to [facility]   PT Next Visit Plan [name] pulsed, ionto (already on script), STM as needed, L hip gait and strengthening   Consulted and Agree with Plan of Care Patient         Problem List Patient Active Problem List   Diagnosis Date Noted  . 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 2 diabetes mellitus 04/24/2014  . Postoperative anemia due to acute blood loss 10/10/2013  . Avascular necrosis of bone of left hip 10/09/2013  . OA (osteoarthritis) of hip 10/09/2013  . Cough 07/31/2013  . Shortness of breath 06/18/2013  . Anxiety   . Diabetes mellitus, type II   . Esophageal dysphagia 09/12/2012  . Depression 02/21/2012  . Stroke 02/11/2012    Stark Bray, DPT, CMP 12/25/2014, 6:09 PM  [facility] [address]  [address] [place] [place], [place], [address] Phone: [phone]   Fax:  [phone]

## 2014-12-29 ENCOUNTER — Ambulatory Visit: Payer: 59 | Admitting: Physical Therapy

## 2014-12-29 DIAGNOSIS — R262 Difficulty in walking, not elsewhere classified: Secondary | ICD-10-CM

## 2014-12-29 DIAGNOSIS — M25552 Pain in left hip: Secondary | ICD-10-CM | POA: Diagnosis not present

## 2014-12-29 NOTE — Therapy (Signed)
[facility] [address]  [place], [place], [address] Phone: [phone]   Fax:  [phone]  Physical Therapy Treatment  Patient Details  Name: Deanna Rowe MRN: [id number] Date of Birth: 10-Feb-1956 Referring Provider:  Rod Can, MD  Encounter Date: 12/29/2014      PT End of Session - 12/29/14 0849    Visit Number 2   Number of Visits 8   Date for PT Re-Evaluation 01/22/15   PT Start Time 0842   PT Stop Time 0924   PT Time Calculation (min) 42 min   Activity Tolerance Patient tolerated treatment well   Behavior During Therapy [facility] for tasks assessed/performed      Past Medical History  Diagnosis Date  . Diabetes mellitus   . Hypertension   . Obesity   . Anxiety   . Depression   . Diabetes mellitus, type II   . PONV (postoperative nausea and vomiting)   . Dry cough   . TIA (transient ischemic attack) 2012    no residual problems  . Arthritis     L HIP  . Avascular necrosis of hip     LEFT  . GERD (gastroesophageal reflux disease)   . Urgency incontinence   . Insomnia     takes Ambien nightly    Past Surgical History  Procedure Laterality Date  . Dilation and curettage of uterus    . Esophageal manometry N/A 09/03/2012    Procedure: ESOPHAGEAL MANOMETRY (EM);  Surgeon: Garlan Fair, MD;  [facility]: [facility];  Service: Endoscopy;  Laterality: N/A;  . Joint replacement  2011    rt total hip  . Exploratory laparotomy    . Pilonidal cyst / sinus excision    . Total hip arthroplasty Left 10/09/2013    Procedure: LEFT TOTAL HIP ARTHROPLASTY ANTERIOR APPROACH;  Surgeon: [name], MD;  [facility]: [facility];  Service: Orthopedics;  Laterality: Left;    There were no vitals filed for this visit.  Visit Diagnosis:  Hip pain, left  Difficulty walking      Subjective Assessment - 12/29/14 0847    Subjective Patient reports HEP is going OK and (ionto) patch really helped. States  left shoe lift in car.   Currently in Pain? Yes   Pain Score 6    Pain Location Hip   Pain Orientation Left;Lateral              [facility] Adult PT Treatment/Exercise - 12/29/14 0842    Exercises   Exercises Knee/Hip   Lumbar Exercises: Stretches   Single Knee to Chest Stretch 20 seconds;3 reps   Lower Trunk Rotation 10 seconds;5 reps   Knee/Hip Exercises: Stretches   Passive Hamstring Stretch Both;20 seconds;3 reps  cues for full 20" hold   Passive Hamstring Stretch Limitations supine with strap & manual by PT   ITB Stretch Both;20 seconds;3 reps   ITB Stretch Limitations supine with strap & manual by PT   Piriformis Stretch Both;30 seconds;3 reps  KTOS & FABER position   Piriformis Stretch Limitations manual   Knee/Hip Exercises: Supine   Hip Adduction Isometric Both;10 reps  3" hold   Hip Adduction Isometric Limitations hooklying with small ball between knees   Bridges Both;10 reps  3" hold   Straight Leg Raises Left;10 reps  3" hold   Other Supine Knee/Hip Exercises Hooklying hip abduction clams with blue TB 20x3"   Iontophoresis   Type of Iontophoresis Dexamethasone  Location L greater trochanter   Dose 80 mA-min, 1.0 mL   Time 6hr wear   Manual Therapy   Manual Therapy Soft tissue mobilization   Soft tissue mobilization STM to left ITB & lateral hamstrings in sidelying                  PT Short Term Goals - 12/25/14 1804    PT SHORT TERM GOAL #1   Title Independent with inital HEP   Time 2   Period Weeks   Status New           PT Long Term Goals - 12/25/14 1804    PT LONG TERM GOAL #1   Title She will be independent with all hEP issued as of last visit   Time 4   Period Weeks   Status New   PT LONG TERM GOAL #2   Title Pt will return to baseline of no lateral L hip pain   Time 4   Period Weeks   Status New   PT LONG TERM GOAL #3   Title pt will return to walking with husband for exercises without limitations due to hip pain   Time  4   Period Weeks   Status New               Plan - 12/29/14 4008    Clinical Impression Statement Patient left heel lift in car therefore unable to assess fit and impact on gait pattern. Patient requiring frequent cues for proper hold time during stretches, therefore performed stretches manually after review of HEP. Tolerated exercise progression but required close monitoring for pain as patient not admitting to increased pain unless asked.   PT Next Visit Plan [name] pulsed?, ionto (already on script), STM as needed, L hip gait and strengthening   Consulted and Agree with Plan of Care Patient        Problem List Patient Active Problem List   Diagnosis Date Noted  . 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 2 diabetes mellitus 04/24/2014  . Postoperative anemia due to acute blood loss 10/10/2013  . Avascular necrosis of bone of left hip 10/09/2013  . OA (osteoarthritis) of hip 10/09/2013  . Cough 07/31/2013  . Shortness of breath 06/18/2013  . Anxiety   . Diabetes mellitus, type II   . Esophageal dysphagia 09/12/2012  . Depression 02/21/2012  . Stroke 02/11/2012    Percival Spanish, PT, MPT  12/29/2014, 9:32 AM  [facility] [address]  [place], [place], [address] Phone: [phone]   Fax:  [phone]

## 2015-01-02 ENCOUNTER — Ambulatory Visit: Payer: 59 | Admitting: Physical Therapy

## 2015-01-02 DIAGNOSIS — M25552 Pain in left hip: Secondary | ICD-10-CM

## 2015-01-02 DIAGNOSIS — R262 Difficulty in walking, not elsewhere classified: Secondary | ICD-10-CM | POA: Diagnosis not present

## 2015-01-02 NOTE — Therapy (Signed)
[facility] [address]  [place], [place], [address] Phone: [phone]   Fax:  [phone]  Physical Therapy Treatment  Patient Details  Name: Deanna Rowe MRN: [id number] Date of Birth: 01/20/1956 Referring Provider:  Rod Can, MD  Encounter Date: 01/02/2015      PT End of Session - 01/02/15 0811    Visit Number 3   Number of Visits 8   Date for PT Re-Evaluation 01/22/15   PT Start Time 0801   PT Stop Time 0848   PT Time Calculation (min) 47 min   Activity Tolerance Patient tolerated treatment well   Behavior During Therapy [facility] for tasks assessed/performed      Past Medical History  Diagnosis Date  . Diabetes mellitus   . Hypertension   . Obesity   . Anxiety   . Depression   . Diabetes mellitus, type II   . PONV (postoperative nausea and vomiting)   . Dry cough   . TIA (transient ischemic attack) 2012    no residual problems  . Arthritis     L HIP  . Avascular necrosis of hip     LEFT  . GERD (gastroesophageal reflux disease)   . Urgency incontinence   . Insomnia     takes Ambien nightly    Past Surgical History  Procedure Laterality Date  . Dilation and curettage of uterus    . Esophageal manometry N/A 09/03/2012    Procedure: ESOPHAGEAL MANOMETRY (EM);  Surgeon: Garlan Fair, MD;  Location: [facility];  Service: Endoscopy;  Laterality: N/A;  . Joint replacement  2011    rt total hip  . Exploratory laparotomy    . Pilonidal cyst / sinus excision    . Total hip arthroplasty Left 10/09/2013    Procedure: LEFT TOTAL HIP ARTHROPLASTY ANTERIOR APPROACH;  Surgeon: [name], MD;  Location: [facility];  Service: Orthopedics;  Laterality: Left;    There were no vitals filed for this visit.  Visit Diagnosis:  Hip pain, left  Difficulty walking      Subjective Assessment - 01/02/15 0808    Subjective Patient states pain is typically worst in the morning and eases slightly  during the day, but still usually averages ~5/10. States she cleaned her house yesterday which may have made it worse. Patient brought heel lift with her today but states she had stopped using it because it was wrinkling up under her foot and she was afraid it would cause a sore.   Currently in Pain? Yes   Pain Score 7    Pain Location Hip   Pain Orientation Left;Lateral                  [facility] Adult PT Treatment/Exercise - 01/02/15 0801    Exercises   Exercises Knee/Hip   Lumbar Exercises: Stretches   Single Knee to Chest Stretch 20 seconds;3 reps   Lower Trunk Rotation 10 seconds;5 reps   Knee/Hip Exercises: Stretches   Passive Hamstring Stretch Both;20 seconds;3 reps  cues for full 20" hold   Passive Hamstring Stretch Limitations supine with strap & manual by PT   ITB Stretch Both;20 seconds;3 reps   ITB Stretch Limitations supine with strap & manual by PT   Piriformis Stretch Both;30 seconds;3 reps  KTOS & FABER position   Piriformis Stretch Limitations manual   Knee/Hip Exercises: Supine   Hip Adduction Isometric Both;10 reps  5" hold   Hip Adduction  Isometric Limitations hooklying with small ball between knees   Bridges with [employer] Both;10 reps  3" hold   Straight Leg Raises Left;10 reps  3" hold   Other Supine Knee/Hip Exercises Hooklying DL hip abduction clams with blue TB 20x3" (attempted SL but deferred d/t c/o Lt hip pain; denied pain with DL)   Knee/Hip Exercises: Sidelying   Hip ABduction Left;10 reps  3" hold   Clams Lt clam in Rt sidelying 10x3" (no resistance)   Modalities   Modalities Iontophoresis   Iontophoresis   Type of Iontophoresis Dexamethasone   Location L greater trochanter   Dose 80 mA-min, 1.0 mL   Time 6hr wear   Manual Therapy   Manual Therapy Soft tissue mobilization   Soft tissue mobilization STM to left piriformis, ITB & lateral hamstrings in sidelying; DTM to left piriformis to pt tolerance                  PT  Short Term Goals - 12/25/14 1804    PT SHORT TERM GOAL #1   Title Independent with inital HEP   Time 2   Period Weeks   Status New           PT Long Term Goals - 01/02/15 1031    PT LONG TERM GOAL #1   Title She will be independent with all HEP issued as of last visit (01/22/15)   Status On-going   PT LONG TERM GOAL #2   Title Pt will return to baseline of no lateral L hip pain (01/22/15)   Status On-going   PT LONG TERM GOAL #3   Title pt will return to walking with husband for exercises without limitations due to hip pain (01/22/15)   Status On-going               Plan - 01/02/15 0825    Clinical Impression Statement Heel lift placed in left shoe (under shoe liner to eliminate patient concern regarding pressure sore from lift material wrinkling up under foot) with patient instructed to wear sneakers with lift as much as possible over weekend, but to remove lift if pain worsens. Progressed exercises with close monitoring for increased pain as patient not admitting to increased pain unless asked.Added STM/DTM to left piriformis today.   PT Next Visit Plan [name] pulsed?, ionto (already on script), STM as needed, L hip gait and strengthening        Problem List Patient Active Problem List   Diagnosis Date Noted  . 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 2 diabetes mellitus 04/24/2014  . Postoperative anemia due to acute blood loss 10/10/2013  . Avascular necrosis of bone of left hip 10/09/2013  . OA (osteoarthritis) of hip 10/09/2013  . Cough 07/31/2013  . Shortness of breath 06/18/2013  . Anxiety   . Diabetes mellitus, type II   . Esophageal dysphagia 09/12/2012  . Depression 02/21/2012  . Stroke 02/11/2012    Percival Spanish, PT, MPT 01/02/2015, 11:07 AM  [facility] [address]  [place], [place], [address] Phone: [phone]   Fax:  [phone]

## 2015-01-05 ENCOUNTER — Ambulatory Visit: Payer: 59 | Attending: Physical Medicine and Rehabilitation | Admitting: Rehabilitation

## 2015-01-05 DIAGNOSIS — R262 Difficulty in walking, not elsewhere classified: Secondary | ICD-10-CM | POA: Insufficient documentation

## 2015-01-05 DIAGNOSIS — M25552 Pain in left hip: Secondary | ICD-10-CM | POA: Diagnosis not present

## 2015-01-05 NOTE — Therapy (Signed)
[facility] [address]  [place], [place], [address] Phone: [phone]   Fax:  [phone]  Physical Therapy Treatment  Patient Details  Name: Deanna Rowe MRN: [id number] Date of Birth: 07/06/1955 Referring Provider:  Rod Can, MD  Encounter Date: 01/05/2015      PT End of Session - 01/05/15 0848    Visit Number 4   Number of Visits 8   Date for PT Re-Evaluation 01/22/15   PT Start Time 0848   PT Stop Time 0931   PT Time Calculation (min) 43 min      Past Medical History  Diagnosis Date  . Diabetes mellitus   . Hypertension   . Obesity   . Anxiety   . Depression   . Diabetes mellitus, type II   . PONV (postoperative nausea and vomiting)   . Dry cough   . TIA (transient ischemic attack) 2012    no residual problems  . Arthritis     L HIP  . Avascular necrosis of hip     LEFT  . GERD (gastroesophageal reflux disease)   . Urgency incontinence   . Insomnia     takes Ambien nightly    Past Surgical History  Procedure Laterality Date  . Dilation and curettage of uterus    . Esophageal manometry N/A 09/03/2012    Procedure: ESOPHAGEAL MANOMETRY (EM);  Surgeon: Garlan Fair, MD;  [facility]: [facility];  Service: Endoscopy;  Laterality: N/A;  . Joint replacement  2011    rt total hip  . Exploratory laparotomy    . Pilonidal cyst / sinus excision    . Total hip arthroplasty Left 10/09/2013    Procedure: LEFT TOTAL HIP ARTHROPLASTY ANTERIOR APPROACH;  Surgeon: [name], MD;  Location: [facility];  Service: Orthopedics;  Laterality: Left;    There were no vitals filed for this visit.  Visit Diagnosis:  Hip pain, left  Difficulty walking      Subjective Assessment - 01/05/15 0849    Subjective Report her pain is a little lower today and the pain is normally worse in the mornings. States heel lift has been doing good but hasn't noticed a major difference in the pain yet. Thinks the  patch really helps.     Currently in Pain? Yes   Pain Score 6    Pain Location Hip   Pain Orientation Lateral;Left   Pain Descriptors / Indicators [facility] Adult PT Treatment/Exercise - 01/05/15 0851    Exercises   Exercises Knee/Hip   Lumbar Exercises: Stretches   Single Knee to Chest Stretch 20 seconds;3 reps   Lower Trunk Rotation 10 seconds;5 reps   Knee/Hip Exercises: Stretches   Passive Hamstring Stretch Both;20 seconds;3 reps   Passive Hamstring Stretch Limitations supine with strap & manual by PT   ITB Stretch Both;20 seconds;3 reps   ITB Stretch Limitations supine with strap & manual by PT   Piriformis Stretch Both;30 seconds;3 reps   Piriformis Stretch Limitations manual   Knee/Hip Exercises: Supine   Hip Adduction Isometric Both;10 reps  5" hold   Hip Adduction Isometric Limitations hooklying with small ball between knees   Bridges with [employer] Both;10 reps  3" hold   Straight Leg Raises Left;10 reps  3" hold   Other Supine Knee/Hip Exercises Hooklying  DL hip abduction clams with blue TB 20x3"   Other Supine Knee/Hip Exercises Hooklying SL hip extension iso/QS into plinth 10x3"   Knee/Hip Exercises: Sidelying   Hip ABduction Left;10 reps  3" hold   Clams Lt clam in Rt sidelying 10x3" (no resistance)   Modalities   Modalities Iontophoresis   Iontophoresis   Type of Iontophoresis Dexamethasone   Location L greater trochanter   Dose 80 mA-min, 1.0 mL   Time 6hr wear   Manual Therapy   Manual Therapy Soft tissue mobilization   Soft tissue mobilization STM to left piriformis, ITB & lateral hamstrings in sidelying; DTM to left piriformis to pt tolerance                  PT Short Term Goals - 12/25/14 1804    PT SHORT TERM GOAL #1   Title Independent with inital HEP   Time 2   Period Weeks   Status New           PT Long Term Goals - 01/02/15 1031    PT LONG TERM GOAL #1   Title She will be  independent with all HEP issued as of last visit (01/22/15)   Status On-going   PT LONG TERM GOAL #2   Title Pt will return to baseline of no lateral L hip pain (01/22/15)   Status On-going   PT LONG TERM GOAL #3   Title pt will return to walking with husband for exercises without limitations due to hip pain (01/22/15)   Status On-going               Plan - 01/05/15 0931    Clinical Impression Statement Good tolerance to exercises without complaint of pain today but did seem to fatigue quickling with most exercises. Continues with ionto patch today due to pt believing it is helping the most. Good tolerance to [facility] today with pt stating it was not as tender as it has been.    PT Next Visit Plan [name] pulsed?, ionto (already on script), STM as needed, L hip gait and strengthening   Consulted and Agree with Plan of Care Patient        Problem List Patient Active Problem List   Diagnosis Date Noted  . 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 2 diabetes mellitus ([place]) 04/24/2014  . Postoperative anemia due to acute blood loss 10/10/2013  . Avascular necrosis of bone of left hip ([place]) 10/09/2013  . OA (osteoarthritis) of hip 10/09/2013  . Cough 07/31/2013  . Shortness of breath 06/18/2013  . Anxiety   . Diabetes mellitus, type II ([place])   . Esophageal dysphagia 09/12/2012  . Depression 02/21/2012  . Stroke [facility]) 02/11/2012    Barbette Hair, PTA 01/05/2015, 9:33 AM  [facility] [address]  [place], [place], [address] Phone: [phone]   Fax:  [phone]

## 2015-01-06 DIAGNOSIS — R1310 Dysphagia, unspecified: Secondary | ICD-10-CM | POA: Diagnosis not present

## 2015-01-09 ENCOUNTER — Ambulatory Visit: Payer: 59 | Admitting: Rehabilitation

## 2015-01-09 DIAGNOSIS — M25552 Pain in left hip: Secondary | ICD-10-CM

## 2015-01-09 DIAGNOSIS — R262 Difficulty in walking, not elsewhere classified: Secondary | ICD-10-CM

## 2015-01-09 NOTE — Therapy (Signed)
[facility] [address]  [place], [place], [address] Phone: [phone]   Fax:  [phone]  Physical Therapy Treatment  Patient Details  Name: Deanna Rowe MRN: [id number] Date of Birth: 27-Apr-1955 Referring Provider:  Rod Can, MD  Encounter Date: 01/09/2015      PT End of Session - 01/09/15 0805    Visit Number 5   Number of Visits 8   Date for PT Re-Evaluation 01/22/15   PT Start Time 0805   PT Stop Time 0850   PT Time Calculation (min) 45 min   Activity Tolerance Patient tolerated treatment well   Behavior During Therapy [facility] for tasks assessed/performed      Past Medical History  Diagnosis Date  . Diabetes mellitus   . Hypertension   . Obesity   . Anxiety   . Depression   . Diabetes mellitus, type II   . PONV (postoperative nausea and vomiting)   . Dry cough   . TIA (transient ischemic attack) 2012    no residual problems  . Arthritis     L HIP  . Avascular necrosis of hip     LEFT  . GERD (gastroesophageal reflux disease)   . Urgency incontinence   . Insomnia     takes Ambien nightly    Past Surgical History  Procedure Laterality Date  . Dilation and curettage of uterus    . Esophageal manometry N/A 09/03/2012    Procedure: ESOPHAGEAL MANOMETRY (EM);  Surgeon: Garlan Fair, MD;  [facility];  Service: Endoscopy;  Laterality: N/A;  . Joint replacement  2011    rt total hip  . Exploratory laparotomy    . Pilonidal cyst / sinus excision    . Total hip arthroplasty Left 10/09/2013    Procedure: LEFT TOTAL HIP ARTHROPLASTY ANTERIOR APPROACH;  Surgeon: [name], MD;  [facility];  Service: Orthopedics;  Laterality: Left;    There were no vitals filed for this visit.  Visit Diagnosis:  Hip pain, left  Difficulty walking      Subjective Assessment - 01/09/15 0808    Subjective Report her pain is as bad as the weather today. Felt good after last time  and thinks the pain patch has been doing the most good.    Currently in Pain? Yes   Pain Score 6    Pain Location Hip   Pain Orientation Lateral;Left                         [facility] Adult PT Treatment/Exercise - 01/09/15 0819    Exercises   Exercises Knee/Hip   Lumbar Exercises: Stretches   Single Knee to Chest Stretch 20 seconds;3 reps   Lower Trunk Rotation 10 seconds;5 reps   Knee/Hip Exercises: Stretches   Passive Hamstring Stretch Both;20 seconds;3 reps   Passive Hamstring Stretch Limitations supine manually   ITB Stretch Both;20 seconds;3 reps   ITB Stretch Limitations supine manually   Piriformis Stretch Both;30 seconds;3 reps   Piriformis Stretch Limitations manually   Knee/Hip Exercises: Supine   Hip Adduction Isometric Both  5" hold, 12 reps   Hip Adduction Isometric Limitations hooklying with small ball between knees   Bridges Both;10 reps  3" hols   Bridges with [employer] Both;10 reps  3" hold   Straight Leg Raises Left  3" hold, 12 reps   Other Supine Knee/Hip Exercises Hooklying DL hip abduction clams  with blue TB 20x3", hooklying SL Hip Adduction Blue TB 10x3"   Other Supine Knee/Hip Exercises Hooklying SL hip extension iso/QS into plinth 10x3"   Modalities   Modalities Iontophoresis   Iontophoresis   Type of Iontophoresis Dexamethasone   Location L greater trochanter   Dose 80 mA-min, 1.0 mL   Time 6hr wear   Manual Therapy   Manual Therapy Soft tissue mobilization   Soft tissue mobilization STM to left piriformis, ITB & lateral hamstrings in sidelying; DTM to left piriformis to pt tolerance                  PT Short Term Goals - 12/25/14 1804    PT SHORT TERM GOAL #1   Title Independent with inital HEP   Time 2   Period Weeks   Status New           PT Long Term Goals - 01/02/15 1031    PT LONG TERM GOAL #1   Title She will be independent with all HEP issued as of last visit (01/22/15)   Status On-going   PT LONG  TERM GOAL #2   Title Pt will return to baseline of no lateral L hip pain (01/22/15)   Status On-going   PT LONG TERM GOAL #3   Title pt will return to walking with husband for exercises without limitations due to hip pain (01/22/15)   Status On-going               Plan - 01/09/15 0854    Clinical Impression Statement Performed less exercise and more manual work today due to high pain levels. Discussed with PT about using pulsed ultrasound as originally put in [place] by another therapist and decided against it due to possible issues with cemet. May try e-stim or tape once ionto runs out.    PT Next Visit Plan ionto (already on script), STM as needed, L hip gait and strengthening   Consulted and Agree with Plan of Care Patient        Problem List Patient Active Problem List   Diagnosis Date Noted  . 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 2 diabetes mellitus ([place]) 04/24/2014  . Postoperative anemia due to acute blood loss 10/10/2013  . Avascular necrosis of bone of left hip ([place]) 10/09/2013  . OA (osteoarthritis) of hip 10/09/2013  . Cough 07/31/2013  . Shortness of breath 06/18/2013  . Anxiety   . Diabetes mellitus, type II ([place])   . Esophageal dysphagia 09/12/2012  . Depression 02/21/2012  . Stroke [facility]) 02/11/2012    Barbette Hair, PTA 01/09/2015, 9:01 AM  [facility] [address]  [place], [place], [address] Phone: [phone]   Fax:  [phone]

## 2015-01-12 ENCOUNTER — Ambulatory Visit: Payer: 59 | Admitting: Physical Therapy

## 2015-01-12 DIAGNOSIS — M25552 Pain in left hip: Secondary | ICD-10-CM | POA: Diagnosis not present

## 2015-01-12 DIAGNOSIS — R262 Difficulty in walking, not elsewhere classified: Secondary | ICD-10-CM

## 2015-01-12 NOTE — Therapy (Signed)
[facility] [address]  [place], [place], [address] Phone: [phone]   Fax:  [phone]  Physical Therapy Treatment  Patient Details  Name: Deanna Rowe MRN: [id number] Date of Birth: Jun 08, 1955 Referring Provider:  Rod Can, MD  Encounter Date: 01/12/2015      PT End of Session - 01/12/15 0807    Visit Number 6   Number of Visits 8   Date for PT Re-Evaluation 01/22/15   PT Start Time 0801   PT Stop Time 0854   PT Time Calculation (min) 53 min   Activity Tolerance Patient tolerated treatment well   Behavior During Therapy [facility] for tasks assessed/performed      Past Medical History  Diagnosis Date  . Diabetes mellitus   . Hypertension   . Obesity   . Anxiety   . Depression   . Diabetes mellitus, type II   . PONV (postoperative nausea and vomiting)   . Dry cough   . TIA (transient ischemic attack) 2012    no residual problems  . Arthritis     L HIP  . Avascular necrosis of hip     LEFT  . GERD (gastroesophageal reflux disease)   . Urgency incontinence   . Insomnia     takes Ambien nightly    Past Surgical History  Procedure Laterality Date  . Dilation and curettage of uterus    . Esophageal manometry N/A 09/03/2012    Procedure: ESOPHAGEAL MANOMETRY (EM);  Surgeon: Garlan Fair, MD;  [facility];  Service: Endoscopy;  Laterality: N/A;  . Joint replacement  2011    rt total hip  . Exploratory laparotomy    . Pilonidal cyst / sinus excision    . Total hip arthroplasty Left 10/09/2013    Procedure: LEFT TOTAL HIP ARTHROPLASTY ANTERIOR APPROACH;  Surgeon: [name], MD;  [facility];  Service: Orthopedics;  Laterality: Left;    There were no vitals filed for this visit.  Visit Diagnosis:  Hip pain, left  Difficulty walking      Subjective Assessment - 01/12/15 0806    Subjective Reports walking is getting better as far as pain goes.   Pain Score 5    Pain Location Hip   Pain Orientation Left;Lateral                [facility] Adult PT Treatment/Exercise - 01/12/15 0801    Exercises   Exercises Knee/Hip   Lumbar Exercises: Stretches   Single Knee to Chest Stretch 20 seconds;3 reps   Lower Trunk Rotation 10 seconds;5 reps   Knee/Hip Exercises: Stretches   Passive Hamstring Stretch Both;20 seconds;3 reps   Passive Hamstring Stretch Limitations supine manually   Quad Stretch Left;20 seconds;3 reps   Quad Stretch Limitations sidelying manually   Hip Flexor Stretch Left;20 seconds;3 reps   Hip Flexor Stretch Limitations sidelying manually   ITB Stretch Both;20 seconds;3 reps   ITB Stretch Limitations supine manually   Piriformis Stretch Both;30 seconds;3 reps   Piriformis Stretch Limitations manually   Knee/Hip Exercises: Aerobic   Nustep lvl 3 x 5"   Knee/Hip Exercises: Supine   Hip Adduction Isometric Both  5" hold, 12 reps   Hip Adduction Isometric Limitations hooklying with small ball between knees   Bridges with [employer] Both  12 reps, 3" hold   Other Supine Knee/Hip Exercises Hooklying DL hip abduction clams with blue TB 20x3", hooklying SL Hip Adduction Blue  TB 10x3"   Knee/Hip Exercises: Sidelying   Hip ABduction Left;10 reps  3" hold   Knee/Hip Exercises: Prone   Hip Extension Left;10 reps  3" hold   Hip Extension Limitations knee flexed   Modalities   Modalities Cryotherapy   Cryotherapy   Number Minutes Cryotherapy 10 Minutes   Cryotherapy Location Hip  Left   Type of Cryotherapy Ice pack   Manual Therapy   Manual Therapy Soft tissue mobilization   Soft tissue mobilization STM to ITB & lateral hamstrings in sidelying to patient tolerance                  PT Short Term Goals - 01/12/15 6270    PT SHORT TERM GOAL #1   Title Independent with inital HEP   Status Achieved           PT Long Term Goals - 01/12/15 3500    PT LONG TERM GOAL #1   Title She will be independent with all HEP  issued as of last visit (01/22/15)   Status On-going   PT LONG TERM GOAL #2   Title Pt will return to baseline of no lateral L hip pain (01/22/15)   Status On-going   PT LONG TERM GOAL #3   Title pt will return to walking with husband for exercises without limitations due to hip pain (01/22/15)   Status On-going               Plan - 01/12/15 0850    Clinical Impression Statement Continued focus on manual therapy with emphasis on lateral hamstring and ITB today as patient reports she feels like pain/tightness is further down. Ionto deferred today to see how patient does without as only one dose remaining.   PT Next Visit Plan assess reponse to therapy w/o ionto, last dose of ionto if needed (already on script), STM as needed, L hip gait and strengthening   Consulted and Agree with Plan of Care Patient        Problem List Patient Active Problem List   Diagnosis Date Noted  . 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 2 diabetes mellitus ([place]) 04/24/2014  . Postoperative anemia due to acute blood loss 10/10/2013  . Avascular necrosis of bone of left hip ([place]) 10/09/2013  . OA (osteoarthritis) of hip 10/09/2013  . Cough 07/31/2013  . Shortness of breath 06/18/2013  . Anxiety   . Diabetes mellitus, type II ([place])   . Esophageal dysphagia 09/12/2012  . Depression 02/21/2012  . Stroke [facility]) 02/11/2012    Percival Spanish, PT, MPT 01/12/2015, 9:00 AM  [facility] [place], [place], [address] Phone: [phone]   Fax:  [phone]

## 2015-01-16 ENCOUNTER — Ambulatory Visit: Payer: 59 | Admitting: Rehabilitation

## 2015-01-16 DIAGNOSIS — R262 Difficulty in walking, not elsewhere classified: Secondary | ICD-10-CM

## 2015-01-16 DIAGNOSIS — M25552 Pain in left hip: Secondary | ICD-10-CM | POA: Diagnosis not present

## 2015-01-16 NOTE — Therapy (Signed)
[facility] [address]  [place], [place], [address] Phone: [phone]   Fax:  [phone]  Physical Therapy Treatment  Patient Details  Name: Deanna Rowe MRN: [id number] Date of Birth: 1955-08-29 No Data Recorded  Encounter Date: 01/16/2015      PT End of Session - 01/16/15 0805    Visit Number 7   Number of Visits 8   Date for PT Re-Evaluation 01/22/15   PT Start Time 0802   PT Stop Time 0843   PT Time Calculation (min) 41 min   Activity Tolerance Patient tolerated treatment well   Behavior During Therapy [facility] for tasks assessed/performed      Past Medical History  Diagnosis Date  . Diabetes mellitus   . Hypertension   . Obesity   . Anxiety   . Depression   . Diabetes mellitus, type II   . PONV (postoperative nausea and vomiting)   . Dry cough   . TIA (transient ischemic attack) 2012    no residual problems  . Arthritis     L HIP  . Avascular necrosis of hip     LEFT  . GERD (gastroesophageal reflux disease)   . Urgency incontinence   . Insomnia     takes Ambien nightly    Past Surgical History  Procedure Laterality Date  . Dilation and curettage of uterus    . Esophageal manometry N/A 09/03/2012    Procedure: ESOPHAGEAL MANOMETRY (EM);  Surgeon: Garlan Fair, MD;  [facility];  Service: Endoscopy;  Laterality: N/A;  . Joint replacement  2011    rt total hip  . Exploratory laparotomy    . Pilonidal cyst / sinus excision    . Total hip arthroplasty Left 10/09/2013    Procedure: LEFT TOTAL HIP ARTHROPLASTY ANTERIOR APPROACH;  Surgeon: [name], MD;  [facility];  Service: Orthopedics;  Laterality: Left;    There were no vitals filed for this visit.  Visit Diagnosis:  Hip pain, left  Difficulty walking      Subjective Assessment - 01/16/15 0804    Subjective Had to go on a car ride Wednesday out of town and came back Thursday which didn't help the pain.  Could tell a difference when we didn't put the patch on. States the Nustep really hurt last time and would like to not do it.    Currently in Pain? Yes   Pain Score 7    Pain Location Hip   Pain Orientation Left;Lateral                         [facility] Adult PT Treatment/Exercise - 01/16/15 0807    Exercises   Exercises Knee/Hip   Lumbar Exercises: Stretches   Single Knee to Chest Stretch 20 seconds;3 reps   Lower Trunk Rotation 10 seconds;5 reps   Knee/Hip Exercises: Stretches   Passive Hamstring Stretch Both;20 seconds;3 reps   Passive Hamstring Stretch Limitations supine manually   Quad Stretch Left;20 seconds;3 reps   Quad Stretch Limitations sidelying manually   Hip Flexor Stretch Left;20 seconds;3 reps   Hip Flexor Stretch Limitations sidelying manually   ITB Stretch Both;20 seconds;3 reps   ITB Stretch Limitations supine manually   Piriformis Stretch Both;30 seconds;3 reps   Piriformis Stretch Limitations manually   Knee/Hip Exercises: Supine   Hip Adduction Isometric Both;15 reps  5" hold   Hip Adduction Isometric Limitations hooklying with  small ball between knees   Bridges with [employer] Both  3" hold, 12 reps   Other Supine Knee/Hip Exercises Hooklying DL hip abduction clams with blue TB 20x3", hooklying SL Hip Adduction Blue TB 10x3"   Knee/Hip Exercises: Sidelying   Hip ABduction Left;10 reps  3" hold   Clams Lt clam in Rt sidelying 10x3" (no resistance)   Iontophoresis   Type of Iontophoresis Dexamethasone   Location L greater trochanter   Dose 80 mA-min, 1.0 mL   Time 6hr wear   Manual Therapy   Manual Therapy Soft tissue mobilization   Soft tissue mobilization STM to Lt ITB, lateral hamstrings, piriformis and around greater trochanter in sidelying to patient tolerance                  PT Short Term Goals - 01/12/15 4650    PT SHORT TERM GOAL #1   Title Independent with inital HEP   Status Achieved           PT Long  Term Goals - 01/12/15 3546    PT LONG TERM GOAL #1   Title She will be independent with all HEP issued as of last visit (01/22/15)   Status On-going   PT LONG TERM GOAL #2   Title Pt will return to baseline of no lateral L hip pain (01/22/15)   Status On-going   PT LONG TERM GOAL #3   Title pt will return to walking with husband for exercises without limitations due to hip pain (01/22/15)   Status On-going               Plan - 01/16/15 0843    Clinical Impression Statement Used finaly ionto today due to pt request. Continued with manual work to tolerance with focus around Lt greater trochanter. Good tolerance to exercises without complaint of pain but pt did state she could "feel it" when performing side-lying clams.    PT Next Visit Plan STM as needed, L hip gait and strengthening   Consulted and Agree with Plan of Care Patient        Problem List Patient Active Problem List   Diagnosis Date Noted  . 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 2 diabetes mellitus ([place]) 04/24/2014  . Postoperative anemia due to acute blood loss 10/10/2013  . Avascular necrosis of bone of left hip ([place]) 10/09/2013  . OA (osteoarthritis) of hip 10/09/2013  . Cough 07/31/2013  . Shortness of breath 06/18/2013  . Anxiety   . Diabetes mellitus, type II ([place])   . Esophageal dysphagia 09/12/2012  . Depression 02/21/2012  . Stroke [facility]) 02/11/2012    Barbette Hair, PTA 01/16/2015, 8:45 AM  [facility] [address]  [place], [place], [address] Phone: [phone]   Fax:  [phone]  Name: Deanna Rowe MRN: [id number] Date of Birth: Sep 02, 1955

## 2015-01-19 ENCOUNTER — Ambulatory Visit: Payer: 59 | Admitting: Physical Therapy

## 2015-01-19 DIAGNOSIS — R262 Difficulty in walking, not elsewhere classified: Secondary | ICD-10-CM | POA: Diagnosis not present

## 2015-01-19 DIAGNOSIS — M25552 Pain in left hip: Secondary | ICD-10-CM | POA: Diagnosis not present

## 2015-01-19 NOTE — Therapy (Signed)
[facility] [address]  [place], [place], [address] Phone: [phone]   Fax:  [phone]  Physical Therapy Treatment  Patient Details  Name: Deanna Rowe MRN: [id number] Date of Birth: Jan 30, 1956 Referring Provider: Rod Can, MD  Encounter Date: 01/19/2015      PT End of Session - 01/19/15 0840    Visit Number 8   Number of Visits 8   PT Start Time 0840   PT Stop Time 0924   PT Time Calculation (min) 44 min   Activity Tolerance Patient tolerated treatment well   Behavior During Therapy [facility] for tasks assessed/performed      Past Medical History  Diagnosis Date  . Diabetes mellitus   . Hypertension   . Obesity   . Anxiety   . Depression   . Diabetes mellitus, type II   . PONV (postoperative nausea and vomiting)   . Dry cough   . TIA (transient ischemic attack) 2012    no residual problems  . Arthritis     L HIP  . Avascular necrosis of hip     LEFT  . GERD (gastroesophageal reflux disease)   . Urgency incontinence   . Insomnia     takes Ambien nightly    Past Surgical History  Procedure Laterality Date  . Dilation and curettage of uterus    . Esophageal manometry N/A 09/03/2012    Procedure: ESOPHAGEAL MANOMETRY (EM);  Surgeon: Garlan Fair, MD;  [facility];  Service: Endoscopy;  Laterality: N/A;  . Joint replacement  2011    rt total hip  . Exploratory laparotomy    . Pilonidal cyst / sinus excision    . Total hip arthroplasty Left 10/09/2013    Procedure: LEFT TOTAL HIP ARTHROPLASTY ANTERIOR APPROACH;  Surgeon: [name], MD;  [facility];  Service: Orthopedics;  Laterality: Left;    There were no vitals filed for this visit.  Visit Diagnosis:  Hip pain, left  Difficulty walking      Subjective Assessment - 01/19/15 0842    Subjective Patient states her bursitis is doing well, but c/o "arthritis pain" in hips back and shoulders this morning. Has  an appt to see the doctor today.   Currently in Pain? No/denies  Bursitis pain   Pain Score 8   Pain Location Back  Lower back, bilateral hips (posterior) & knees, and bilateral shoulders - "Joints in general"   Pain Descriptors / Indicators Stabbing;Aching            [facility] PT Assessment - 01/19/15 0850    Assessment   Referring Provider Rod Can, MD   Observation/Other Assessments   Focus on Therapeutic Outcomes (FOTO)  13% limitation   ROM / Strength   AROM / PROM / Strength Strength   Strength   Strength Assessment Site Hip;Knee   Right/Left Hip Right;Left   Right Hip Flexion 4+/5   Right Hip Extension 4-/5   Right Hip External Rotation  4+/5   Right Hip Internal Rotation 4+/5   Right Hip ABduction 4+/5   Right Hip ADduction 4+/5   Left Hip Flexion 4+/5   Left Hip Extension 4-/5   Left Hip External Rotation 4/5   Left Hip Internal Rotation 4/5   Left Hip ABduction 4/5   Left Hip ADduction 4/5   Right/Left Knee Right;Left   Right Knee Flexion 5/5   Right Knee Extension 5/5   Left Knee Flexion 4+/5  Left Knee Extension 5/5                     [facility] Adult PT Treatment/Exercise - 01/19/15 0850    Exercises   Exercises Knee/Hip   Lumbar Exercises: Stretches   Single Knee to Chest Stretch 20 seconds;3 reps   Lower Trunk Rotation 10 seconds;5 reps   Knee/Hip Exercises: Stretches   Passive Hamstring Stretch Both;20 seconds;3 reps  cues for full 20" hold   Passive Hamstring Stretch Limitations supine with strap & manual by PT   ITB Stretch Both;20 seconds;3 reps   ITB Stretch Limitations supine with strap & manual by PT   Piriformis Stretch Both;30 seconds;3 reps  KTOS & FABER position   Piriformis Stretch Limitations manual   Knee/Hip Exercises: Supine   Hip Adduction Isometric Both;15 reps  5" hold   Hip Adduction Isometric Limitations hooklying with small ball between knees   Bridges with [employer] Both  3" hold, 12 reps   Other  Supine Knee/Hip Exercises Hooklying DL hip abduction clams with blue TB 20x3", hooklying SL Hip Adduction Blue TB 10x3"                PT Education - 01/19/15 0924    Education provided Yes   Education Details Final HEP   Person(s) Educated Patient   Methods Explanation;Demonstration;Handout   Comprehension Verbalized understanding;Returned demonstration          PT Short Term Goals - 01/12/15 0832    PT SHORT TERM GOAL #1   Title Independent with inital HEP   Status Achieved           PT Long Term Goals - 01/19/15 0847    PT LONG TERM GOAL #1   Title She will be independent with all HEP issued as of last visit (01/22/15)   Status Achieved   PT LONG TERM GOAL #2   Title Pt will return to baseline of no lateral L hip pain (01/22/15)   Status Achieved   PT LONG TERM GOAL #3   Title pt will return to walking with husband for exercises without limitations due to hip pain (01/22/15)   Status Not Met  Has not returned to walking with husband, but due to pain in low back rather than lateral hip from [place]               Plan - 01/19/15 0911    Clinical Impression Statement Patient has demonstrated good response to PT for left hip bursitis with patient currently denying any pain in lateral hip (reporting genralized pain in all joints which she attributes to arthritis and for which she will see primary MD today). Patient denies any limitations in current daily activities due to left lateral hip pain. Patient is independent with HEP. No futher PT needs identified related to referring condition, therefore will proceed with discharge from PT for this episode. If new PT needs identified when patient sees primary MD today, instructed patient to request new PT referral.   PT Next Visit Plan Discharge   Consulted and Agree with Plan of Care Patient        Problem List Patient Active Problem List   Diagnosis Date Noted  . 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 2 diabetes mellitus  ([place]) 04/24/2014  . Postoperative anemia due to acute blood loss 10/10/2013  . Avascular necrosis of bone of left hip ([place]) 10/09/2013  . OA (osteoarthritis) of hip 10/09/2013  . Cough 07/31/2013  . Shortness  of breath 06/18/2013  . Anxiety   . Diabetes mellitus, type II ([place])   . Esophageal dysphagia 09/12/2012  . Depression 02/21/2012  . Stroke [facility]) 02/11/2012    Percival Spanish, PT, MPT 01/19/2015, 10:49 AM  [facility] [address]  [place], [place], [address] Phone: [phone]   Fax:  [phone]  Name: ANYAE GRIFFITH MRN: [id number] Date of Birth: 1955-06-24   PHYSICAL THERAPY DISCHARGE SUMMARY  Visits from Start of Care: 8  Current functional level related to goals / functional outcomes:  Patient has demonstrated good response to PT for left hip bursitis with patient currently denying any pain in lateral hip (reporting genralized pain in all joints which she attributes to arthritis and for which she will see primary MD today). Patient denies any limitations in current daily activities due to left lateral hip pain. Patient is independent with HEP. All goals met except walking goal (not met due to pain unrelated to referring diagnosis) and no futher PT needs identified related to referring condition, therefore will proceed with discharge from PT for this episode. If new PT needs identified when patient sees primary MD today, instructed patient to request new PT referral.   Remaining deficits:  Generalized pain in low back, shoulders, hips and knees which patient attributes to arthritis (to see primary MD today)   Education / Equipment:  HEP  Plan: Patient agrees to discharge.  Patient goals were partially met. Patient is being discharged due to being pleased with the current functional level.  ?????     Percival Spanish, PT, MPT 01/19/2015, 10:49 AM  [facility] [address]  [place], [place], [address] Phone: [phone]   Fax:  [phone]

## 2015-01-23 ENCOUNTER — Ambulatory Visit: Payer: 59 | Admitting: Physical Therapy

## 2015-01-29 ENCOUNTER — Other Ambulatory Visit: Payer: Self-pay

## 2015-01-29 MED ORDER — GLUCOSE BLOOD VI STRP: ORAL_STRIP | Status: DC

## 2015-01-30 DIAGNOSIS — G8929 Other chronic pain: Secondary | ICD-10-CM | POA: Diagnosis not present

## 2015-01-30 DIAGNOSIS — M50322 Other cervical disc degeneration at C5-C6 level: Secondary | ICD-10-CM | POA: Diagnosis not present

## 2015-01-30 DIAGNOSIS — M542 Cervicalgia: Secondary | ICD-10-CM | POA: Diagnosis not present

## 2015-01-30 DIAGNOSIS — M5442 Lumbago with sciatica, left side: Secondary | ICD-10-CM | POA: Diagnosis not present

## 2015-02-03 ENCOUNTER — Other Ambulatory Visit: Payer: Self-pay

## 2015-02-03 DIAGNOSIS — Z1231 Encounter for screening mammogram for malignant neoplasm of breast: Secondary | ICD-10-CM

## 2015-02-05 ENCOUNTER — Ambulatory Visit
Admission: RE | Admit: 2015-02-05 | Discharge: 2015-02-05 | Disposition: A | Discharge status: Home / Self Care | Payer: 59 | Source: Ambulatory Visit

## 2015-02-05 DIAGNOSIS — Z1231 Encounter for screening mammogram for malignant neoplasm of breast: Secondary | ICD-10-CM

## 2015-03-02 ENCOUNTER — Encounter: Payer: Self-pay | Admitting: Endocrinology

## 2015-03-02 ENCOUNTER — Ambulatory Visit (INDEPENDENT_AMBULATORY_CARE_PROVIDER_SITE_OTHER): Payer: 59 | Admitting: Endocrinology

## 2015-03-02 VITALS — BP 140/88 | HR 84 | Temp 98.6°F | Ht 66.0 in | Wt 210.0 lb

## 2015-03-02 DIAGNOSIS — E119 Type 2 diabetes mellitus without complications: Secondary | ICD-10-CM | POA: Diagnosis not present

## 2015-03-02 LAB — POCT GLYCOSYLATED HEMOGLOBIN (HGB A1C): Hemoglobin A1C: 6.3

## 2015-03-02 MED ORDER — GLUCOSE BLOOD VI STRP: 1.0000 | ORAL_STRIP | Freq: Every day | Status: DC

## 2015-03-02 MED ORDER — LINAGLIPTIN 5 MG PO TABS: 2.5000 mg | ORAL_TABLET | Freq: Every day | ORAL | Status: DC

## 2015-03-02 NOTE — Progress Notes (Signed)
Subjective:    Patient ID: Deanna Rowe, female    DOB: 1955-09-23, 59 y.o.   MRN: [id number]  HPI Pt returns for f/u of diabetes mellitus: DM type: 2 Dx'ed: 0000000 Complications: renal insufficiency Therapy: 2 oral meds GDM: never DKA: never Severe hypoglycemia: never Pancreatitis: never Other: she has never been on insulin Interval history: no cbg record, but states cbg's are in the 50's at hs.  Activity continues to be limited by right hip pain. Past Medical History  Diagnosis Date  . Diabetes mellitus   . Hypertension   . Obesity   . Anxiety   . Depression   . Diabetes mellitus, type II ([place])   . PONV (postoperative nausea and vomiting)   . Dry cough   . TIA (transient ischemic attack) 2012    no residual problems  . Arthritis     L HIP  . Avascular necrosis of hip (HCC)     LEFT  . GERD (gastroesophageal reflux disease)   . Urgency incontinence   . Insomnia     takes Ambien nightly    Past Surgical History  Procedure Laterality Date  . Dilation and curettage of uterus    . Esophageal manometry N/A 09/03/2012    Procedure: ESOPHAGEAL MANOMETRY (EM);  Surgeon: Garlan Fair, MD;  Location: [facility];  Service: Endoscopy;  Laterality: N/A;  . Joint replacement  2011    rt total hip  . Exploratory laparotomy    . Pilonidal cyst / sinus excision    . Total hip arthroplasty Left 10/09/2013    Procedure: LEFT TOTAL HIP ARTHROPLASTY ANTERIOR APPROACH;  Surgeon: [name], MD;  Location: [facility];  Service: Orthopedics;  Laterality: Left;    Social History   Social History  . Marital Status: Married    Spouse Name: N/A  . Number of Children: N/A  . Years of Education: N/A   Occupational History  . Not on file.   Social History Main Topics  . Smoking status: Never Smoker   . Smokeless tobacco: Never Used  . Alcohol Use: No  . Drug Use: No  . Sexual Activity: Not Currently   Other Topics Concern  . Not on file   Social History Narrative     Current Outpatient Prescriptions on File Prior to Visit  Medication Sig Dispense Refill  . albuterol (PROVENTIL HFA;VENTOLIN HFA) 108 (90 BASE) MCG/ACT inhaler Inhale 2 puffs into the lungs as needed for wheezing or shortness of breath (every 4 hours as needed for cough, wheezing and chest tightness).    Marland Kitchen amLODipine (NORVASC) 5 MG tablet Take 5 mg by mouth every morning.     . ARIPiprazole (ABILIFY) 5 MG tablet Take 5 mg by mouth every evening.     Marland Kitchen aspirin EC 81 MG tablet Take 81 mg by mouth every evening.    Marland Kitchen buPROPion (WELLBUTRIN XL) 150 MG 24 hr tablet Take 450 mg by mouth every morning.     Marland Kitchen CALCIUM-VITAMIN D PO Take 1 tablet by mouth daily.    . ClonazePAM (KLONOPIN PO) Take 1 mg by mouth 2 (two) times daily. For anxiety    . gabapentin (NEURONTIN) 300 MG capsule Take 300 mg by mouth at bedtime.     . metFORMIN (GLUCOPHAGE) 500 MG tablet Take 1,000 mg by mouth 2 (two) times daily with a meal.     . pantoprazole (PROTONIX) 40 MG tablet Take 40 mg by mouth 2 (two) times daily.     Marland Kitchen  ROPINIRole HCl (REQUIP PO) Take 2 mg by mouth 2 (two) times daily.     . valACYclovir (VALTREX) 1000 MG tablet Take 1,000 mg by mouth daily.    . valsartan-hydrochlorothiazide (DIOVAN-HCT) 160-12.5 MG per tablet Take 0.5 tablets by mouth 3 (three) times a week. MWF    . VITAMIN D, ERGOCALCIFEROL, PO Take 1 capsule by mouth daily.    . Vortioxetine HBr (TRINTELLIX) 10 MG TABS Take by mouth.    . zolpidem (AMBIEN) 5 MG tablet Take 5 mg by mouth at bedtime.     . hydrOXYzine (VISTARIL) 25 MG capsule Take 25 mg by mouth 2 (two) times daily as needed.    . vitamin C (ASCORBIC ACID) 500 MG tablet Take 500 mg by mouth daily.     Current Facility-Administered Medications on File Prior to Visit  Medication Dose Route Frequency Provider Last Rate Last Dose  . tranexamic acid (CYKLOKAPRON) topical -INTRAOP  2,000 mg Topical Once [employer], PA-C        Allergies  Allergen Reactions  . Codeine Nausea  And Vomiting  . Trulicity [Dulaglutide] Nausea And Vomiting    Significant and undesirably rapid (40 lbs) weight loss   . Xanax [Alprazolam] Other (See Comments)    disoriented  . Amoxicillin Rash    Family History  Problem Relation Age of Onset  . Alcohol abuse Brother   . Alcohol abuse Brother   . Alcohol abuse Brother   . Alcohol abuse Brother   . Diabetes Neg Hx     BP 140/88 mmHg  Pulse 84  Temp(Src) 98.6 F (37 C) (Oral)  Ht 5\' 6"  (1.676 m)  Wt 210 lb (95.255 kg)  BMI 33.91 kg/m2  SpO2 97%   Review of Systems Denies LOC    Objective:   Physical Exam VITAL SIGNS:  See vs page GENERAL: no distress PSYCH: Alert and well-oriented.  Does not appear anxious nor depressed.    A1c=6.3%    Assessment & Plan:  DM: overcontrolled, per cbg's  Patient is advised the following: Patient Instructions  check your blood sugar once a day.  vary the time of day when you check, between before the 3 meals, and at bedtime.  also check if you have symptoms of your blood sugar being too high or too low.  please keep a record of the readings and bring it to your next appointment here.  You can write it on any piece of paper.  please call us sooner if your blood sugar goes below 70, or if you have a lot of readings over 200.   Please reduce the tradjenta to 1/2 pill per day.  Please come back for a follow-up appointment in 3 months.

## 2015-03-02 NOTE — Patient Instructions (Addendum)
check your blood sugar once a day.  vary the time of day when you check, between before the 3 meals, and at bedtime.  also check if you have symptoms of your blood sugar being too high or too low.  please keep a record of the readings and bring it to your next appointment here.  You can write it on any piece of paper.  please call us sooner if your blood sugar goes below 70, or if you have a lot of readings over 200.   Please reduce the tradjenta to 1/2 pill per day.  Please come back for a follow-up appointment in 3 months.

## 2015-03-17 ENCOUNTER — Encounter: Payer: Self-pay | Admitting: Pharmacist

## 2015-03-17 ENCOUNTER — Ambulatory Visit (INDEPENDENT_AMBULATORY_CARE_PROVIDER_SITE_OTHER): Payer: Self-pay | Admitting: Family Medicine

## 2015-03-17 VITALS — BP 132/81 | HR 79 | Ht 66.0 in | Wt 207.8 lb

## 2015-03-17 DIAGNOSIS — E119 Type 2 diabetes mellitus without complications: Secondary | ICD-10-CM

## 2015-03-17 NOTE — Progress Notes (Signed)
Subjective:  Patient presents today for 3 month diabetes follow-up as part of the employer-sponsored [employer] program.  Current diabetes regimen includes Tradjenta 5mg  1/2 daily, metformin 1,000 mg bid. Patient also continues on daily ARB, ASA 81 EC but not currently a statin (to discuss with PCP). Patient has recently seen endo on 03/02/2015. Only medication change at that time was a decrease of Tradjenta from 5mg  daily to 2.5mg  daily.  Assessment:  Diabetes: Most recent A1C was 6.3% which is at goal of less than 7%. Weight is decreased 11.2 lbs from last visit with me.  Lifestyle improvements:  Physical Activity- Still reports "some" activity, but mostly seems to not be very active. She does appear today to have extra energy compared to normal, so I have again pushed for a more consistent exercise routine.  Nutrition- Diet has improved significantly over the past few months. She reports cutting out "all sweets and sodas and big carbohydrates" and her weight/A1c seem to substantiate her claim. She also reports increasing vegetables and water consumption. Husband has recently (1 week ago) had lap band procedure and she has taken it as an opportunity to improve her eating habits as well.  Follow up with me in 3 months.    Plan/Goals for Next Visit:  1. Talk with Dr. Drema Dallas about whether a statin is appropriate. 2. Report if you have any more lows. 3. Start more consistent exercise routine. Target goal is at least 150 minutes per week.

## 2015-03-31 NOTE — Progress Notes (Signed)
I have reviewed this pharmacist's note and agree  

## 2015-04-09 DIAGNOSIS — R000 Tachycardia, unspecified: Secondary | ICD-10-CM | POA: Insufficient documentation

## 2015-04-09 MED FILL — ATORVASTATIN 10 MG TABLET: 10 | 30 days supply | Qty: 30 | Fill #1

## 2015-04-09 MED FILL — BUPROPION HCL XL 150 MG TAB: 150 | 90 days supply | Qty: 270 | Fill #0

## 2015-04-10 DIAGNOSIS — N951 Menopausal and female climacteric states: Secondary | ICD-10-CM | POA: Diagnosis not present

## 2015-04-13 MED FILL — metFORMIN HCL 500 MG TABS: 500 | 90 days supply | Qty: 360 | Fill #0

## 2015-04-15 DIAGNOSIS — M5136 Other intervertebral disc degeneration, lumbar region: Secondary | ICD-10-CM | POA: Diagnosis not present

## 2015-04-15 DIAGNOSIS — M50322 Other cervical disc degeneration at C5-C6 level: Secondary | ICD-10-CM | POA: Diagnosis not present

## 2015-04-15 DIAGNOSIS — M5416 Radiculopathy, lumbar region: Secondary | ICD-10-CM | POA: Diagnosis not present

## 2015-04-16 ENCOUNTER — Encounter: Payer: Self-pay | Admitting: Cardiology

## 2015-04-16 ENCOUNTER — Ambulatory Visit (INDEPENDENT_AMBULATORY_CARE_PROVIDER_SITE_OTHER): Payer: 59

## 2015-04-16 ENCOUNTER — Ambulatory Visit (INDEPENDENT_AMBULATORY_CARE_PROVIDER_SITE_OTHER): Payer: 59 | Admitting: Cardiology

## 2015-04-16 VITALS — BP 110/70 | HR 90 | Ht 66.0 in | Wt 200.6 lb

## 2015-04-16 DIAGNOSIS — R002 Palpitations: Secondary | ICD-10-CM | POA: Diagnosis not present

## 2015-04-16 DIAGNOSIS — R079 Chest pain, unspecified: Secondary | ICD-10-CM

## 2015-04-16 LAB — CBC WITH DIFFERENTIAL/PLATELET
Basophils Absolute: 0 10*3/uL (ref 0.0–0.1)
Basophils Relative: 0 % (ref 0–1)
Eosinophils Absolute: 0.1 10*3/uL (ref 0.0–0.7)
Eosinophils Relative: 1 % (ref 0–5)
HCT: 39 % (ref 36.0–46.0)
Hemoglobin: 13.1 g/dL (ref 12.0–15.0)
Lymphocytes Relative: 40 % (ref 12–46)
Lymphs Abs: 2.6 10*3/uL (ref 0.7–4.0)
MCH: 32.2 pg (ref 26.0–34.0)
MCHC: 33.6 g/dL (ref 30.0–36.0)
MCV: 95.8 fL (ref 78.0–100.0)
MPV: 9.1 fL (ref 8.6–12.4)
Monocytes Absolute: 0.6 10*3/uL (ref 0.1–1.0)
Monocytes Relative: 9 % (ref 3–12)
Neutro Abs: 3.3 10*3/uL (ref 1.7–7.7)
Neutrophils Relative %: 50 % (ref 43–77)
Platelets: 291 10*3/uL (ref 150–400)
RBC: 4.07 MIL/uL (ref 3.87–5.11)
RDW: 15.6 % — ABNORMAL HIGH (ref 11.5–15.5)
WBC: 6.6 10*3/uL (ref 4.0–10.5)

## 2015-04-16 LAB — COMPREHENSIVE METABOLIC PANEL
ALT: 13 U/L (ref 6–29)
AST: 17 U/L (ref 10–35)
Albumin: 4.4 g/dL (ref 3.6–5.1)
Alkaline Phosphatase: 45 U/L (ref 33–130)
BUN: 15 mg/dL (ref 7–25)
CO2: 27 mmol/L (ref 20–31)
Calcium: 10.3 mg/dL (ref 8.6–10.4)
Chloride: 102 mmol/L (ref 98–110)
Creat: 1.21 mg/dL — ABNORMAL HIGH (ref 0.50–0.99)
Glucose, Bld: 80 mg/dL (ref 65–99)
Potassium: 4.9 mmol/L (ref 3.5–5.3)
Sodium: 140 mmol/L (ref 135–146)
Total Bilirubin: 0.4 mg/dL (ref 0.2–1.2)
Total Protein: 7.6 g/dL (ref 6.1–8.1)

## 2015-04-16 NOTE — Patient Instructions (Signed)
Medication Instructions:  Your physician recommends that you continue on your current medications as directed. Please refer to the Current Medication list given to you today.   Labwork: Cmet, Cbc, Tsh  Testing/Procedures: Your physician has recommended that you wear a holter monitor. Holter monitors are medical devices that record the heart's electrical activity. Doctors most often use these monitors to diagnose arrhythmias. Arrhythmias are problems with the speed or rhythm of the heartbeat. The monitor is a small, portable device. You can wear one while you do your normal daily activities. This is usually used to diagnose what is causing palpitations/syncope (passing out).  Your physician has requested that you have an exercise tolerance test. For further information please visit [email or web address]. Please also follow instruction sheet, as given.    Follow-Up: Your physician recommends that you schedule a follow-up appointment in: 2 weeks with Dr.Varansi or an APP   Any Other Special Instructions Will Be Listed Below (If Applicable).     If you need a refill on your cardiac medications before your next appointment, please call your pharmacy.

## 2015-04-16 NOTE — Progress Notes (Signed)
04/16/2015 Deanna Rowe   07-07-1955  [id number]  Primary Physician Gerrit Heck, MD Primary Cardiologist: Dr. Irish Lack   Reason for Visit/CC: PCP referral for palpitations/tachycardia with exertion  HPI:  The patient is a 60 year old female who presents to clinic today as advised by her PCP, for evaluation for palpitations/tachycardia with exertion. She's been seen in the past by Dr. Irish Lack. She was last seen 02/22/2013. At that time, she presented with complaints of dizziness and palpitations. Dr. Irish Lack ordered an event monitor as well as an echocardiogram to evaluate for structural heart disease. Her echo revealed normal left ventricular systolic function, with an EF of 55-60% as well as normal valvular function.  Wall motion was asynchronous consistent with intraventricular conduction delay (IVCD orbundle branch block); there were no regional wall motion abnormalities. Doppler parameters were consistent with abnormal left ventricular relaxation (grade 1 diastolic dysfunction). Heart monitor showed sinus rhythm with no arrhythmias. Labs were also obtained during that time. CBC was negative for anemia. Hemoglobin 12.5.  She now reports recurrent symptoms, mainly during exercise. She recently started an exercise program 4 weeks ago and has noticed palpitations with exertion. She has also noticed increased pulse rates while exercising on the treadmill. She reports that her rates have increased into the 200s during exercise. This usually occurs after 20 minutes of exercise. She reports walking at a low grade and low speed (level 1). She notes feeling weak in her legs, SOB and faint when this occurs but denies exertional chest discomfort, syncope/ near syncope. She does not increased chest discomfort/ indigestion at rest, that can occur anytime, w/o relationship with meals. This has been occuring off and on for the last several weeks. She does note a family h/o CAD. Her brother had  an MI. She also notes a h/o anxiety and depression. She takes both Wellbutrin and clonazepam which both can cause tachycardia. She reports that she has been staying well hydrated before and during exercise. She eats a balanced diet and no recent changes to her diet.     Current Outpatient Prescriptions  Medication Sig Dispense Refill  . albuterol (PROVENTIL HFA;VENTOLIN HFA) 108 (90 BASE) MCG/ACT inhaler Inhale 2 puffs into the lungs as needed for wheezing or shortness of breath (every 4 hours as needed for cough, wheezing and chest tightness).    Marland Kitchen amLODipine (NORVASC) 5 MG tablet Take 5 mg by mouth every morning.     . ARIPiprazole (ABILIFY) 5 MG tablet Take 5 mg by mouth every evening.     Marland Kitchen aspirin EC 81 MG tablet Take 81 mg by mouth every evening.    Marland Kitchen atorvastatin (LIPITOR) 10 MG tablet Take 10 mg by mouth daily.  2  . buPROPion (WELLBUTRIN XL) 150 MG 24 hr tablet Take 450 mg by mouth every morning.     Marland Kitchen CALCIUM-VITAMIN D PO Take 1 tablet by mouth daily.    . clonazePAM (KLONOPIN) 1 MG tablet Take 1 mg by mouth 2 (two) times daily.  1  . gabapentin (NEURONTIN) 300 MG capsule Take 300 mg by mouth at bedtime.     Marland Kitchen glucose blood (ACCU-CHEK AVIVA) test strip 1 each by Other route daily. and Lancets 3/day 100 each 12  . linagliptin (TRADJENTA) 5 MG TABS tablet Take 0.5 tablets (2.5 mg total) by mouth daily. 30 tablet 11  . metFORMIN (GLUCOPHAGE) 500 MG tablet Take 1,000 mg by mouth 2 (two) times daily with a meal.     . pantoprazole (PROTONIX) 40 MG tablet  Take 40 mg by mouth 2 (two) times daily.     Marland Kitchen rOPINIRole (REQUIP) 2 MG tablet Take 2 mg by mouth 2 (two) times daily.  3  . ROPINIRole HCl (REQUIP PO) Take 2 mg by mouth 2 (two) times daily.     . valACYclovir (VALTREX) 1000 MG tablet Take 1,000 mg by mouth daily.    . valsartan-hydrochlorothiazide (DIOVAN-HCT) 160-12.5 MG per tablet Take 0.5 tablets by mouth 3 (three) times a week. MWF    . vitamin C (ASCORBIC ACID) 500 MG tablet Take  500 mg by mouth daily.    Marland Kitchen VITAMIN D, ERGOCALCIFEROL, PO Take 1 capsule by mouth daily.    . Vortioxetine HBr (TRINTELLIX) 10 MG TABS Take 1 tablet by mouth.     . zolpidem (AMBIEN) 5 MG tablet Take 5 mg by mouth at bedtime.      No current facility-administered medications for this visit.   Facility-Administered Medications Ordered in Other Visits  Medication Dose Route Frequency Provider Last Rate Last Dose  . tranexamic acid (CYKLOKAPRON) topical -INTRAOP  2,000 mg Topical Once [employer], PA-C        Allergies  Allergen Reactions  . Codeine Nausea And Vomiting  . Fetzima [Levomilnacipran] Nausea And Vomiting  . Trulicity [Dulaglutide] Nausea And Vomiting    Significant and undesirably rapid (40 lbs) weight loss   . Xanax [Alprazolam] Other (See Comments)    disoriented  . Amoxicillin Rash    Social History   Social History  . Marital Status: Married    Spouse Name: N/A  . Number of Children: N/A  . Years of Education: N/A   Occupational History  . Not on file.   Social History Main Topics  . Smoking status: Never Smoker   . Smokeless tobacco: Never Used  . Alcohol Use: No  . Drug Use: No  . Sexual Activity: Not Currently   Other Topics Concern  . Not on file   Social History Narrative     Review of Systems: General: negative for chills, fever, night sweats or weight changes.  Cardiovascular: negative for chest pain, dyspnea on exertion, edema, orthopnea, palpitations, paroxysmal nocturnal dyspnea or shortness of breath Dermatological: negative for rash Respiratory: negative for cough or wheezing Urologic: negative for hematuria Abdominal: negative for nausea, vomiting, diarrhea, bright red blood per rectum, melena, or hematemesis Neurologic: negative for visual changes, syncope, or dizziness All other systems reviewed and are otherwise negative except as noted above.    Blood pressure 110/70, pulse 90, height 5\' 6"  (1.676 m), weight 200 lb 9.6 oz  (90.992 kg).  General appearance: alert, cooperative and no distress Neck: no carotid bruit and no JVD Lungs: clear to auscultation bilaterally Heart: regular rate and rhythm, S1, S2 normal, no murmur, click, rub or gallop Extremities: no LEE Pulses: 2+ and symmetric Skin: warm and dry Neurologic: Grossly normal  EKG NSR. 91 bpm no ischemia.   ASSESSMENT AND PLAN:   1. Exertional Tachycardia: ? Rates in the 200s on the treadmill with symptoms of weakness, dyspnea and near syncope. She denies frank syncope and exertional CP. Will arrange for 48 heart monitor. Patient encouraged to exercise as she regularly does, while wearing heart monitor so that we can capture her heart rhythm and get a more accurate assessment of heart rate while this occurs. ? Accuracy of treadmill readings. She also has a h/o anxiety and depression, on both Wellbutrin and clonazepam which can both cause tachycardia, which may cause higher than normal  rates with exercise. Her resting HR on EKG today is in the upper limits of normal in the 90s. Her EKG shows no ischemia. We will also check a TSH to assess thyroid function, a CBC to w/o anemia and a CMP to assess electrolytes and renal function to r/o dehydration. Patient encouraged to stay well hydrated with fluids and eat prior to exercising.   2. Atypical Chest Pain: patient notes symptoms of "indigestion like discomfort" but this does not always occur in relation to meals. She also denies exertional CP but has exertional dyspnea and higher than normal HR response with mild-moderate physical activity. She does have a family h/o CAD, thus we will further evaluate with an ETT to r/o ischemia.    PLAN  F/u in 2 weeks.   Lyda Jester PA-C 04/16/2015 11:14 AM

## 2015-04-17 LAB — TSH: TSH: 0.703 u[IU]/mL (ref 0.350–4.500)

## 2015-04-20 ENCOUNTER — Telehealth: Payer: Self-pay | Admitting: Interventional Cardiology

## 2015-04-20 ENCOUNTER — Telehealth: Payer: Self-pay | Admitting: *Deleted

## 2015-04-20 NOTE — Telephone Encounter (Signed)
-----   Message from Consuelo Pandy, [place] sent at 04/17/2015  4:15 PM EST ----- Electrolytes, thyroid and blood count are all ok and normal.

## 2015-04-20 NOTE — Telephone Encounter (Signed)
New problem   Pt stated she is returning call from nurse.

## 2015-04-22 MED FILL — PANTOPRAZOLE SOD DR 40 MG T: 40 | 90 days supply | Qty: 180 | Fill #0

## 2015-04-24 ENCOUNTER — Encounter: Payer: 59 | Admitting: Physician Assistant

## 2015-04-24 ENCOUNTER — Ambulatory Visit (INDEPENDENT_AMBULATORY_CARE_PROVIDER_SITE_OTHER): Payer: 59

## 2015-04-24 DIAGNOSIS — R079 Chest pain, unspecified: Secondary | ICD-10-CM

## 2015-04-24 LAB — EXERCISE TOLERANCE TEST
Estimated workload: 7 METS
Exercise duration (min): 5 min
MPHR: 160 {beats}/min
Peak HR: 151 {beats}/min
Percent HR: 94 %
RPE: 15
Rest HR: 66 {beats}/min

## 2015-04-24 MED FILL — GABAPENTIN 300 MG CAPSULE: 300 | 30 days supply | Qty: 90 | Fill #0

## 2015-04-27 DIAGNOSIS — R232 Flushing: Secondary | ICD-10-CM | POA: Diagnosis not present

## 2015-04-29 ENCOUNTER — Ambulatory Visit: Payer: Self-pay | Admitting: Cardiology

## 2015-04-30 ENCOUNTER — Ambulatory Visit (INDEPENDENT_AMBULATORY_CARE_PROVIDER_SITE_OTHER): Payer: 59 | Admitting: Cardiology

## 2015-04-30 ENCOUNTER — Encounter: Payer: Self-pay | Admitting: Cardiology

## 2015-04-30 VITALS — BP 148/90 | HR 84 | Ht 66.0 in | Wt 204.0 lb

## 2015-04-30 DIAGNOSIS — R002 Palpitations: Secondary | ICD-10-CM | POA: Diagnosis not present

## 2015-04-30 MED ORDER — METOPROLOL TARTRATE 25 MG PO TABS: 12.5000 mg | ORAL_TABLET | Freq: Two times a day (BID) | ORAL | Status: DC

## 2015-04-30 MED FILL — METOPROLOL TARTRATE 25 MG T: 25 | 90 days supply | Qty: 90 | Fill #0

## 2015-04-30 NOTE — Patient Instructions (Signed)
Medication Instructions:  Your physician has recommended you make the following change in your medication:  1. Start Metoprolol ( 12.5 mg ) one half tablet twice a day   Labwork: -None  Testing/Procedures: -None  Follow-Up: Your physician wants you to follow-up in: 6 months with Dr. Irish Lack.  You will receive a reminder letter in the mail two months in advance. If you don't receive a letter, please call our office to schedule the follow-up appointment.   Any Other Special Instructions Will Be Listed Below (If Applicable). Monitor bp at home if consistently stays above 135/80 please call our office so we can adjust medication   If you need a refill on your cardiac medications before your next appointment, please call your pharmacy.

## 2015-04-30 NOTE — Progress Notes (Signed)
04/30/2015 Laurier Nancy Summerson   1955/12/30  [id number]  Primary Physician Gerrit Heck, MD Primary Cardiologist: Dr. Irish Lack  Reason for Visit/CC: F/u for Palpitations and Chest Pain  HPI:  The patient is a 60 year old female who presents back to clinic today for f/u for palpitations/tachycardia with exertion and chest pain. This is a f/u visit from 04/16/15.  She's been seen in the past by Dr. Irish Lack. Last seen by him in 2015. At that time, she presented with complaints of dizziness and palpitations. Dr. Irish Lack ordered an event monitor as well as an echocardiogram to evaluate for structural heart disease. Her echo revealed normal left ventricular systolic function, with an EF of 55-60% as well as normal valvular function. Wall motion was asynchronous consistent with intraventricular conduction delay (IVCD orbundle branch block); there were no regional wall motion abnormalities. Doppler parameters were consistent with abnormal left ventricular relaxation (grade 1 diastolic dysfunction). Heart monitor showed sinus rhythm with no arrhythmias. Labs were also obtained during that time. CBC was negative for anemia. Hemoglobin 12.5.  When she presented 04/16/15, she reported pulse rates in the low 200s with exercise and also noted palpitations, exertional dyspnea and chest pain. This lead to increased anxiety which she felt was further exacerbating her problems. She denied any symptoms at rest. Subsequently, I ordered basic labs as well as an exercise tolerance test to assess for exertional arrhthymias and to r/o ischemia. I also ordered a 48 hr heart monitor as well. CBC was negative for anemia. BMP showed normal K. TSH was also normal. Her ETT was negative for ischemia. No arrthymias noted during stress. Heart monitor was also fairly normal showing mostly NSR with occasinal PVCs and PACs.    Current Outpatient Prescriptions  Medication Sig Dispense Refill  . albuterol (PROVENTIL  HFA;VENTOLIN HFA) 108 (90 BASE) MCG/ACT inhaler Inhale 2 puffs into the lungs as needed for wheezing or shortness of breath (every 4 hours as needed for cough, wheezing and chest tightness).    Marland Kitchen amLODipine (NORVASC) 5 MG tablet Take 5 mg by mouth every morning.     . ARIPiprazole (ABILIFY) 5 MG tablet Take 5 mg by mouth every evening.     Marland Kitchen aspirin EC 81 MG tablet Take 81 mg by mouth every evening.    Marland Kitchen atorvastatin (LIPITOR) 10 MG tablet Take 10 mg by mouth daily.  2  . buPROPion (WELLBUTRIN XL) 150 MG 24 hr tablet Take 450 mg by mouth every morning.     Marland Kitchen CALCIUM-VITAMIN D PO Take 1 tablet by mouth daily.    . clonazePAM (KLONOPIN) 1 MG tablet Take 1 mg by mouth 2 (two) times daily.  1  . gabapentin (NEURONTIN) 300 MG capsule Take 300 mg by mouth 3 (three) times daily.     Marland Kitchen glucose blood (ACCU-CHEK AVIVA) test strip 1 each by Other route daily. and Lancets 3/day 100 each 12  . linagliptin (TRADJENTA) 5 MG TABS tablet Take 0.5 tablets (2.5 mg total) by mouth daily. 30 tablet 11  . metFORMIN (GLUCOPHAGE) 500 MG tablet Take 1,000 mg by mouth 2 (two) times daily with a meal.     . pantoprazole (PROTONIX) 40 MG tablet Take 40 mg by mouth 2 (two) times daily.     Marland Kitchen rOPINIRole (REQUIP) 2 MG tablet Take 2 mg by mouth 2 (two) times daily.  3  . ROPINIRole HCl (REQUIP PO) Take 2 mg by mouth 2 (two) times daily.     . valACYclovir (VALTREX) 1000 MG tablet  Take 1,000 mg by mouth daily.    . valsartan-hydrochlorothiazide (DIOVAN-HCT) 160-12.5 MG per tablet Take 0.5 tablets by mouth 3 (three) times a week. MWF    . vitamin C (ASCORBIC ACID) 500 MG tablet Take 500 mg by mouth daily.    Marland Kitchen VITAMIN D, ERGOCALCIFEROL, PO Take 1 capsule by mouth daily.    . Vortioxetine HBr (TRINTELLIX) 10 MG TABS Take 1 tablet by mouth.     . zolpidem (AMBIEN) 5 MG tablet Take 5 mg by mouth at bedtime.      No current facility-administered medications for this visit.   Facility-Administered Medications Ordered in Other  Visits  Medication Dose Route Frequency Provider Last Rate Last Dose  . tranexamic acid (CYKLOKAPRON) topical -INTRAOP  2,000 mg Topical Once [employer], PA-C        Allergies  Allergen Reactions  . Codeine Nausea And Vomiting  . Fetzima [Levomilnacipran] Nausea And Vomiting  . Trulicity [Dulaglutide] Nausea And Vomiting    Significant and undesirably rapid (40 lbs) weight loss   . Xanax [Alprazolam] Other (See Comments)    disoriented  . Amoxicillin Rash    Social History   Social History  . Marital Status: Married    Spouse Name: N/A  . Number of Children: N/A  . Years of Education: N/A   Occupational History  . Not on file.   Social History Main Topics  . Smoking status: Never Smoker   . Smokeless tobacco: Never Used  . Alcohol Use: No  . Drug Use: No  . Sexual Activity: Not Currently   Other Topics Concern  . Not on file   Social History Narrative     Review of Systems: General: negative for chills, fever, night sweats or weight changes.  Cardiovascular: negative for chest pain, dyspnea on exertion, edema, orthopnea, palpitations, paroxysmal nocturnal dyspnea or shortness of breath Dermatological: negative for rash Respiratory: negative for cough or wheezing Urologic: negative for hematuria Abdominal: negative for nausea, vomiting, diarrhea, bright red blood per rectum, melena, or hematemesis Neurologic: negative for visual changes, syncope, or dizziness All other systems reviewed and are otherwise negative except as noted above.    Blood pressure 148/90, pulse 84, height 5\' 6"  (1.676 m), weight 204 lb (92.534 kg).  General appearance: alert, cooperative and no distress Neck: no carotid bruit and no JVD Lungs: clear to auscultation bilaterally Heart: regular rate and rhythm, S1, S2 normal, no murmur, click, rub or gallop Extremities: extremities normal, atraumatic, no cyanosis or edema Pulses: 2+ and symmetric Skin: warm and dry Neurologic: Grossly  normal  EKG Not performed.   ASSESSMENT AND PLAN:   1. Chest Pain: ETT negative for ischemia. No significant recurrence. Continue medical therapy for primary prevention.   2. Palpitations with Exertion: CBC was negative for anemia. BMP showed normal K. TSH was also normal. Her ETT was negative for ischemia. No arrthymias noted during stress. Heart monitor was also fairly normal showing mostly NSR with occasinal PVCs and PACs. She continues to feel occasional palpitations with exercise. Her BP today is in the upper 0000000 systolic. She does not appear to be on a rate control agent. We will add low dose metoprolol for PVCs/ palpitations and BP. This will also hopefully help with her anxiety as well. She will monitor her HR and BP at home and will notify our office if any issues.    PLAN  Add 12.5 mg of lopressor BID. F/u in 6 months.   Lyda Jester PA-C 04/30/2015 9:08  AM

## 2015-05-01 MED FILL — valACYclovir HCL 1 GM TABS: 1 | 90 days supply | Qty: 90 | Fill #3

## 2015-05-12 MED FILL — ACCU-CHEK AVIVA PLUS TEST S: 90 days supply | Qty: 300 | Fill #0

## 2015-05-12 MED FILL — clonazePAM 1 MG TABS: 1 | 90 days supply | Qty: 180 | Fill #1

## 2015-05-12 MED FILL — ATORVASTATIN 10 MG TABLET: 10 | 30 days supply | Qty: 30 | Fill #2

## 2015-05-12 MED FILL — ACCU-CHEK MULTICLIX LANCETS: 90 days supply | Qty: 306 | Fill #0

## 2015-05-13 DIAGNOSIS — R232 Flushing: Secondary | ICD-10-CM | POA: Diagnosis not present

## 2015-05-28 MED FILL — GABAPENTIN 300 MG CAPSULE: 300 | 30 days supply | Qty: 90 | Fill #1

## 2015-05-28 MED FILL — ZOLPIDEM TARTRATE 5 MG TAB: 5 | 90 days supply | Qty: 90 | Fill #1

## 2015-06-01 MED FILL — ATORVASTATIN 10 MG TABLET: 10 | 90 days supply | Qty: 90 | Fill #0

## 2015-06-02 ENCOUNTER — Ambulatory Visit (INDEPENDENT_AMBULATORY_CARE_PROVIDER_SITE_OTHER): Payer: 59 | Admitting: Endocrinology

## 2015-06-02 ENCOUNTER — Encounter: Payer: Self-pay | Admitting: Endocrinology

## 2015-06-02 VITALS — BP 122/70 | HR 67 | Temp 98.2°F | Ht 66.0 in | Wt 203.0 lb

## 2015-06-02 DIAGNOSIS — E119 Type 2 diabetes mellitus without complications: Secondary | ICD-10-CM | POA: Diagnosis not present

## 2015-06-02 DIAGNOSIS — E1122 Type 2 diabetes mellitus with diabetic chronic kidney disease: Secondary | ICD-10-CM

## 2015-06-02 DIAGNOSIS — N183 Chronic kidney disease, stage 3 unspecified: Secondary | ICD-10-CM

## 2015-06-02 LAB — POCT GLYCOSYLATED HEMOGLOBIN (HGB A1C): Hemoglobin A1C: 6.1

## 2015-06-02 MED FILL — ARIPiprazole 5 MG TABS: 5 | 90 days supply | Qty: 90 | Fill #1

## 2015-06-02 MED FILL — BRISDELLE 7.5 MG CAPSULE: 7.5 | 30 days supply | Qty: 30 | Fill #0

## 2015-06-02 NOTE — Progress Notes (Signed)
Subjective:    Patient ID: Deanna Rowe, female    DOB: 1955-07-29, 60 y.o.   MRN: [id number]  HPI Pt returns for f/u of diabetes mellitus: DM type: 2 Dx'ed: 0000000 Complications: renal insufficiency Therapy: 2 oral meds GDM: never DKA: never Severe hypoglycemia: never Pancreatitis: never Other: she has never been on insulin Interval history: no cbg record, but states cbg's are well-controlled.  Activity continues to be limited by right hip pain.  Past Medical History  Diagnosis Date  . Diabetes mellitus   . Hypertension   . Obesity   . Anxiety   . Depression   . Diabetes mellitus, type II ([place])   . PONV (postoperative nausea and vomiting)   . Dry cough   . TIA (transient ischemic attack) 2012    no residual problems  . Arthritis     L HIP  . Avascular necrosis of hip (HCC)     LEFT  . GERD (gastroesophageal reflux disease)   . Urgency incontinence   . Insomnia     takes Ambien nightly    Past Surgical History  Procedure Laterality Date  . Dilation and curettage of uterus    . Esophageal manometry N/A 09/03/2012    Procedure: ESOPHAGEAL MANOMETRY (EM);  Surgeon: Garlan Fair, MD;  Location: [facility];  Service: Endoscopy;  Laterality: N/A;  . Joint replacement  2011    rt total hip  . Exploratory laparotomy    . Pilonidal cyst / sinus excision    . Total hip arthroplasty Left 10/09/2013    Procedure: LEFT TOTAL HIP ARTHROPLASTY ANTERIOR APPROACH;  Surgeon: [name], MD;  Location: [facility];  Service: Orthopedics;  Laterality: Left;    Social History   Social History  . Marital Status: Married    Spouse Name: N/A  . Number of Children: N/A  . Years of Education: N/A   Occupational History  . Not on file.   Social History Main Topics  . Smoking status: Never Smoker   . Smokeless tobacco: Never Used  . Alcohol Use: No  . Drug Use: No  . Sexual Activity: Not Currently   Other Topics Concern  . Not on file   Social History Narrative     Current Outpatient Prescriptions on File Prior to Visit  Medication Sig Dispense Refill  . albuterol (PROVENTIL HFA;VENTOLIN HFA) 108 (90 BASE) MCG/ACT inhaler Inhale 2 puffs into the lungs as needed for wheezing or shortness of breath (every 4 hours as needed for cough, wheezing and chest tightness).    Marland Kitchen amLODipine (NORVASC) 5 MG tablet Take 5 mg by mouth every morning.     . ARIPiprazole (ABILIFY) 5 MG tablet Take 5 mg by mouth every evening.     Marland Kitchen aspirin EC 81 MG tablet Take 81 mg by mouth every evening.    Marland Kitchen atorvastatin (LIPITOR) 10 MG tablet Take 10 mg by mouth daily.  2  . buPROPion (WELLBUTRIN XL) 150 MG 24 hr tablet Take 450 mg by mouth every morning.     Marland Kitchen CALCIUM-VITAMIN D PO Take 1 tablet by mouth daily.    . clonazePAM (KLONOPIN) 1 MG tablet Take 1 mg by mouth 2 (two) times daily.  1  . gabapentin (NEURONTIN) 300 MG capsule Take 300 mg by mouth 3 (three) times daily.     Marland Kitchen glucose blood (ACCU-CHEK AVIVA) test strip 1 each by Other route daily. and Lancets 3/day 100 each 12  . linagliptin (TRADJENTA) 5 MG TABS  tablet Take 0.5 tablets (2.5 mg total) by mouth daily. 30 tablet 11  . metFORMIN (GLUCOPHAGE) 500 MG tablet Take 1,000 mg by mouth 2 (two) times daily with a meal.     . metoprolol tartrate (LOPRESSOR) 25 MG tablet Take 0.5 tablets (12.5 mg total) by mouth 2 (two) times daily. 90 tablet 3  . pantoprazole (PROTONIX) 40 MG tablet Take 40 mg by mouth 2 (two) times daily.     Marland Kitchen rOPINIRole (REQUIP) 2 MG tablet Take 2 mg by mouth 2 (two) times daily.  3  . ROPINIRole HCl (REQUIP PO) Take 2 mg by mouth 2 (two) times daily.     . valACYclovir (VALTREX) 1000 MG tablet Take 1,000 mg by mouth daily.    . valsartan-hydrochlorothiazide (DIOVAN-HCT) 160-12.5 MG per tablet Take 0.5 tablets by mouth 3 (three) times a week. MWF    . vitamin C (ASCORBIC ACID) 500 MG tablet Take 500 mg by mouth daily.    Marland Kitchen VITAMIN D, ERGOCALCIFEROL, PO Take 1 capsule by mouth daily. 2000 units daily.     . Vortioxetine HBr (TRINTELLIX) 10 MG TABS Take 1 tablet by mouth.     . zolpidem (AMBIEN) 5 MG tablet Take 5 mg by mouth at bedtime.      Current Facility-Administered Medications on File Prior to Visit  Medication Dose Route Frequency Provider Last Rate Last Dose  . tranexamic acid (CYKLOKAPRON) topical -INTRAOP  2,000 mg Topical Once [employer], PA-C        Allergies  Allergen Reactions  . Codeine Nausea And Vomiting  . Fetzima [Levomilnacipran] Nausea And Vomiting  . Trulicity [Dulaglutide] Nausea And Vomiting    Significant and undesirably rapid (40 lbs) weight loss   . Xanax [Alprazolam] Other (See Comments)    disoriented  . Amoxicillin Rash    Family History  Problem Relation Age of Onset  . Alcohol abuse Brother   . Alcohol abuse Brother   . Alcohol abuse Brother   . Alcohol abuse Brother   . Diabetes Neg Hx     BP 122/70 mmHg  Pulse 67  Temp(Src) 98.2 F (36.8 C) (Oral)  Ht 5\' 6"  (1.676 m)  Wt 203 lb (92.08 kg)  BMI 32.78 kg/m2  SpO2 97%  Review of Systems she denies hypoglycemia    Objective:   Physical Exam VITAL SIGNS:  See vs page GENERAL: no distress Pulses: dorsalis pedis intact bilat.   MSK: no deformity of the feet CV: no leg edema Skin:  no ulcer on the feet.  normal color and temp on the feet. Neuro: sensation is intact to touch on the feet   A1c=6.1% Lab Results  Component Value Date   CREATININE 1.21* 04/16/2015   BUN 15 04/16/2015   NA 140 04/16/2015   K 4.9 04/16/2015   CL 102 04/16/2015   CO2 27 04/16/2015      Assessment & Plan:  DM: well-controlled  Patient is advised the following: Patient Instructions  check your blood sugar once a day.  vary the time of day when you check, between before the 3 meals, and at bedtime.  also check if you have symptoms of your blood sugar being too high or too low.  please keep a record of the readings and bring it to your next appointment here.  You can write it on any piece of  paper.  please call us sooner if your blood sugar goes below 70, or if you have a lot of readings over 200.  Please continue the same medications.  Please come back for a follow-up appointment in 6 months.

## 2015-06-02 NOTE — Patient Instructions (Addendum)
check your blood sugar once a day.  vary the time of day when you check, between before the 3 meals, and at bedtime.  also check if you have symptoms of your blood sugar being too high or too low.  please keep a record of the readings and bring it to your next appointment here.  You can write it on any piece of paper.  please call us sooner if your blood sugar goes below 70, or if you have a lot of readings over 200.  Please continue the same medications.  Please come back for a follow-up appointment in 6 months.    

## 2015-06-03 DIAGNOSIS — E1142 Type 2 diabetes mellitus with diabetic polyneuropathy: Secondary | ICD-10-CM | POA: Insufficient documentation

## 2015-06-03 DIAGNOSIS — N1832 Chronic kidney disease, stage 3b: Secondary | ICD-10-CM | POA: Insufficient documentation

## 2015-06-03 DIAGNOSIS — Z794 Long term (current) use of insulin: Secondary | ICD-10-CM | POA: Insufficient documentation

## 2015-06-03 DIAGNOSIS — E119 Type 2 diabetes mellitus without complications: Secondary | ICD-10-CM | POA: Insufficient documentation

## 2015-06-03 DIAGNOSIS — E1122 Type 2 diabetes mellitus with diabetic chronic kidney disease: Secondary | ICD-10-CM | POA: Insufficient documentation

## 2015-06-08 MED FILL — TRADJENTA 5 MG TABLET: 5 | 30 days supply | Qty: 30 | Fill #5

## 2015-06-15 MED FILL — AMLODIPINE BESYLATE 5 MG TA: 5 | 90 days supply | Qty: 90 | Fill #2

## 2015-06-15 MED FILL — rOPINIRole HCL 2 MG TABS: 2 | 90 days supply | Qty: 180 | Fill #3

## 2015-06-15 MED FILL — TRINTELLIX 10 MG TABLET: 10 | 90 days supply | Qty: 90 | Fill #1

## 2015-06-16 ENCOUNTER — Telehealth: Payer: Self-pay | Admitting: Interventional Cardiology

## 2015-06-16 ENCOUNTER — Ambulatory Visit (INDEPENDENT_AMBULATORY_CARE_PROVIDER_SITE_OTHER): Payer: Self-pay | Admitting: Family Medicine

## 2015-06-16 ENCOUNTER — Encounter: Payer: Self-pay | Admitting: Pharmacist

## 2015-06-16 VITALS — BP 109/76 | HR 67 | Ht 66.0 in | Wt 200.8 lb

## 2015-06-16 DIAGNOSIS — E7800 Pure hypercholesterolemia, unspecified: Secondary | ICD-10-CM | POA: Diagnosis not present

## 2015-06-16 DIAGNOSIS — E119 Type 2 diabetes mellitus without complications: Secondary | ICD-10-CM

## 2015-06-16 DIAGNOSIS — N183 Chronic kidney disease, stage 3 (moderate): Secondary | ICD-10-CM | POA: Diagnosis not present

## 2015-06-16 NOTE — Progress Notes (Signed)
Subjective:  Patient presents today for 3 month diabetes follow-up as part of the employer-sponsored [employer] program.  Current diabetes regimen includes Tradjenta and metformin. She also continues on a daily 81mg  EC ASA and valsartan/hctz. She has recently started atorvastatin 10mg  without complaint.  Most recent MD follow-up was 06/02/15 (endo).  No med changes or major health changes at this time.    Assessment:  Diabetes: Most recent A1C was 6.1% which is at goal of less than 7%. Weight is decreased 7 lbs from last visit with me.   Lifestyle improvements:  Physical Activity/Nutrition- She has recently started exercising very regularly. Her husband has recently had bariatric surgery and she is taking the opportunity to improve her eating and exercise habits. Significant weight loss (~20 lbs) in last 6 months. Wonderful progress and seems very encouraged to keep it going.   Follow up with me in 3 months.    Plan/Goals for Next Visit:  1. Continue wonderful exercise and eating plan. 2. Follow up with cardiologist and/or PCP regarding side effects related to metoprolol. 3. Be aware that you may have more difficulty detecting low blood sugars due to the new medication (metoprolol).    Next appointment to see me is: Tentatively June 13th @ 11AM.

## 2015-06-16 NOTE — Telephone Encounter (Signed)
At the pts last OV with Lyda Jester, PA-C on 1/26 she was advised to start taking Lopressor 12.5 BID. She states that she misunderstood the the directions in the beginning and was only taking one 12.5 mg tablet daily (not BID). She states that she has been taking Lopressor 12.5 mg BID for a week now. She c/o SOB, being light headed, weak and fatigued.  She wants to know what she should do. Please advise.

## 2015-06-16 NOTE — Telephone Encounter (Signed)
New Message:  Please call,concerning her Lopressor dosage.

## 2015-06-17 DIAGNOSIS — F251 Schizoaffective disorder, depressive type: Secondary | ICD-10-CM | POA: Diagnosis not present

## 2015-06-17 NOTE — Telephone Encounter (Signed)
LMTCB

## 2015-06-17 NOTE — Telephone Encounter (Signed)
OK to go back to metoprolol 12.5 mg daily.

## 2015-06-18 MED ORDER — METOPROLOL TARTRATE 25 MG PO TABS: 12.5000 mg | ORAL_TABLET | Freq: Every morning | ORAL | Status: DC

## 2015-06-18 NOTE — Telephone Encounter (Signed)
Follow up  Pt is returning call to rn that she cant take medication as prescribed

## 2015-06-18 NOTE — Telephone Encounter (Signed)
Pt is aware that Dr. Irish Lack  okay for her to go back to Metoprolol 12.5 mg  daily. Pt verbalized understanding.

## 2015-06-24 MED FILL — GAVILYTE-N SOLUTION: 420 | 1 days supply | Qty: 4000 | Fill #0

## 2015-06-26 DIAGNOSIS — Z1211 Encounter for screening for malignant neoplasm of colon: Secondary | ICD-10-CM | POA: Diagnosis not present

## 2015-07-06 MED FILL — metFORMIN HCL 500 MG TABS: 500 | 30 days supply | Qty: 120 | Fill #0

## 2015-07-06 MED FILL — BUPROPION HCL XL 150 MG TAB: 150 | 90 days supply | Qty: 270 | Fill #1

## 2015-07-06 MED FILL — GABAPENTIN 300 MG CAPSULE: 300 | 30 days supply | Qty: 90 | Fill #2

## 2015-07-06 MED FILL — BRISDELLE 7.5 MG CAPSULE: 7.5 | 30 days supply | Qty: 30 | Fill #1

## 2015-07-10 ENCOUNTER — Telehealth: Payer: Self-pay | Admitting: Endocrinology

## 2015-07-10 NOTE — Telephone Encounter (Signed)
Metformin is better for you than tradjenta Please d/c tradjenta Please continue the same metformin.

## 2015-07-10 NOTE — Telephone Encounter (Signed)
See note below and please advise, Thanks! 

## 2015-07-10 NOTE — Telephone Encounter (Signed)
Pt advised of note below and voiced understanding.  

## 2015-07-10 NOTE — Telephone Encounter (Signed)
BS has been dropping into the 50s for the past week wondering if the metformin should be adjusted

## 2015-07-16 MED FILL — PANTOPRAZOLE SOD DR 40 MG T: 40 | 90 days supply | Qty: 180 | Fill #1

## 2015-07-30 MED FILL — valACYclovir HCL 1 GM TABS: 1 | 30 days supply | Qty: 30 | Fill #0

## 2015-08-07 MED FILL — clonazePAM 1 MG TABS: 1 | 90 days supply | Qty: 180 | Fill #0

## 2015-08-07 MED FILL — GABAPENTIN 300 MG CAPSULE: 300 | 30 days supply | Qty: 90 | Fill #0

## 2015-08-10 ENCOUNTER — Telehealth: Payer: Self-pay | Admitting: Endocrinology

## 2015-08-10 NOTE — Telephone Encounter (Signed)
PT said that Dr. Loanne Drilling stopped the 1/2 tab of Tradjenta because her sugars were low, however they are back up and she would like to know if she can start back taking this medication.

## 2015-08-10 NOTE — Telephone Encounter (Signed)
Pt advised of note below and voiced understanding. Pt scheduled for 08/13/2015.

## 2015-08-10 NOTE — Telephone Encounter (Signed)
See note below and please advise, Thanks! 

## 2015-08-10 NOTE — Telephone Encounter (Signed)
Please move-up ov to next available. 

## 2015-08-11 MED FILL — BRISDELLE 7.5 MG CAPSULE: 7.5 | 30 days supply | Qty: 30 | Fill #2

## 2015-08-13 ENCOUNTER — Ambulatory Visit (INDEPENDENT_AMBULATORY_CARE_PROVIDER_SITE_OTHER): Payer: 59 | Admitting: Endocrinology

## 2015-08-13 ENCOUNTER — Encounter: Payer: Self-pay | Admitting: Endocrinology

## 2015-08-13 VITALS — BP 118/70 | HR 68 | Temp 98.1°F | Ht 66.0 in | Wt 200.0 lb

## 2015-08-13 DIAGNOSIS — N183 Chronic kidney disease, stage 3 unspecified: Secondary | ICD-10-CM

## 2015-08-13 DIAGNOSIS — E1122 Type 2 diabetes mellitus with diabetic chronic kidney disease: Secondary | ICD-10-CM | POA: Diagnosis not present

## 2015-08-13 LAB — POCT GLYCOSYLATED HEMOGLOBIN (HGB A1C): Hemoglobin A1C: 6.2

## 2015-08-13 MED ORDER — LINAGLIPTIN 5 MG PO TABS: ORAL_TABLET | ORAL | Status: DC

## 2015-08-13 MED FILL — TRADJENTA 5 MG TABLET: 5 | 88 days supply | Qty: 22 | Fill #0

## 2015-08-13 NOTE — Progress Notes (Signed)
Subjective:    Patient ID: Deanna Rowe, female    DOB: 1955/09/11, 60 y.o.   MRN: [id number]  HPI Pt returns for f/u of diabetes mellitus: DM type: 2 Dx'ed: 0000000 Complications: renal insufficiency Therapy: 2 oral meds GDM: never DKA: never Severe hypoglycemia: never Pancreatitis: never Other: she has never been on insulin Interval history: no cbg record, but states cbg's are well-controlled, except it was recently 7 once, after she misses lunch Past Medical History  Diagnosis Date  . Diabetes mellitus   . Hypertension   . Obesity   . Anxiety   . Depression   . Diabetes mellitus, type II ([place])   . PONV (postoperative nausea and vomiting)   . Dry cough   . TIA (transient ischemic attack) 2012    no residual problems  . Arthritis     L HIP  . Avascular necrosis of hip (HCC)     LEFT  . GERD (gastroesophageal reflux disease)   . Urgency incontinence   . Insomnia     takes Ambien nightly    Past Surgical History  Procedure Laterality Date  . Dilation and curettage of uterus    . Esophageal manometry N/A 09/03/2012    Procedure: ESOPHAGEAL MANOMETRY (EM);  Surgeon: Garlan Fair, MD;  Location: [facility];  Service: Endoscopy;  Laterality: N/A;  . Joint replacement  2011    rt total hip  . Exploratory laparotomy    . Pilonidal cyst / sinus excision    . Total hip arthroplasty Left 10/09/2013    Procedure: LEFT TOTAL HIP ARTHROPLASTY ANTERIOR APPROACH;  Surgeon: [name], MD;  Location: [facility];  Service: Orthopedics;  Laterality: Left;    Social History   Social History  . Marital Status: Married    Spouse Name: N/A  . Number of Children: N/A  . Years of Education: N/A   Occupational History  . Not on file.   Social History Main Topics  . Smoking status: Never Smoker   . Smokeless tobacco: Never Used  . Alcohol Use: No  . Drug Use: No  . Sexual Activity: Not Currently   Other Topics Concern  . Not on file   Social History Narrative     Current Outpatient Prescriptions on File Prior to Visit  Medication Sig Dispense Refill  . albuterol (PROVENTIL HFA;VENTOLIN HFA) 108 (90 BASE) MCG/ACT inhaler Inhale 2 puffs into the lungs as needed for wheezing or shortness of breath (every 4 hours as needed for cough, wheezing and chest tightness).    Marland Kitchen amLODipine (NORVASC) 5 MG tablet Take 5 mg by mouth every morning.     . ARIPiprazole (ABILIFY) 5 MG tablet Take 5 mg by mouth every evening.     Marland Kitchen aspirin EC 81 MG tablet Take 81 mg by mouth every evening.    Marland Kitchen atorvastatin (LIPITOR) 10 MG tablet Take 10 mg by mouth daily.  2  . buPROPion (WELLBUTRIN XL) 150 MG 24 hr tablet Take 450 mg by mouth every morning.     Marland Kitchen CALCIUM-VITAMIN D PO Take 1 tablet by mouth daily.    . clonazePAM (KLONOPIN) 1 MG tablet Take 1 mg by mouth 2 (two) times daily.  1  . gabapentin (NEURONTIN) 300 MG capsule Take 300 mg by mouth 3 (three) times daily.     Marland Kitchen glucose blood (ACCU-CHEK AVIVA) test strip 1 each by Other route daily. and Lancets 3/day 100 each 12  . metFORMIN (GLUCOPHAGE) 500 MG tablet Take  1,000 mg by mouth 2 (two) times daily with a meal.     . metoprolol tartrate (LOPRESSOR) 25 MG tablet Take 0.5 tablets (12.5 mg total) by mouth every morning. 90 tablet 3  . pantoprazole (PROTONIX) 40 MG tablet Take 40 mg by mouth 2 (two) times daily.     Marland Kitchen PARoxetine Mesylate (BRISDELLE PO) Take by mouth.    Marland Kitchen rOPINIRole (REQUIP) 2 MG tablet Take 2 mg by mouth 2 (two) times daily.  3  . ROPINIRole HCl (REQUIP PO) Take 2 mg by mouth 2 (two) times daily.     . valACYclovir (VALTREX) 1000 MG tablet Take 1,000 mg by mouth daily.    . valsartan-hydrochlorothiazide (DIOVAN-HCT) 160-12.5 MG per tablet Take 0.5 tablets by mouth 3 (three) times a week. MWF    . vitamin C (ASCORBIC ACID) 500 MG tablet Take 500 mg by mouth daily.    Marland Kitchen VITAMIN D, ERGOCALCIFEROL, PO Take 1 capsule by mouth daily. 2000 units daily.    . Vortioxetine HBr (TRINTELLIX) 10 MG TABS Take 1 tablet  by mouth.     . zolpidem (AMBIEN) 5 MG tablet Take 5 mg by mouth at bedtime.      Current Facility-Administered Medications on File Prior to Visit  Medication Dose Route Frequency Provider Last Rate Last Dose  . tranexamic acid (CYKLOKAPRON) topical -INTRAOP  2,000 mg Topical Once [employer], PA-C        Allergies  Allergen Reactions  . Codeine Nausea And Vomiting  . Fetzima [Levomilnacipran] Nausea And Vomiting  . Trulicity [Dulaglutide] Nausea And Vomiting    Significant and undesirably rapid (40 lbs) weight loss   . Xanax [Alprazolam] Other (See Comments)    disoriented  . Amoxicillin Rash    Family History  Problem Relation Age of Onset  . Alcohol abuse Brother   . Alcohol abuse Brother   . Alcohol abuse Brother   . Alcohol abuse Brother   . Diabetes Neg Hx     BP 118/70 mmHg  Pulse 68  Temp(Src) 98.1 F (36.7 C) (Oral)  Ht 5\' 6"  (1.676 m)  Wt 200 lb (90.719 kg)  BMI 32.30 kg/m2  SpO2 98%  Review of Systems She denies LOC    Objective:   Physical Exam VITAL SIGNS:  See vs page GENERAL: no distress Pulses: dorsalis pedis intact bilat.   MSK: no deformity of the feet CV: no leg edema Skin:  no ulcer on the feet.  normal color and temp on the feet. Neuro: sensation is intact to touch on the feet.     A1c=6.2%    Assessment & Plan:  DM: well-controlled.  Renal failure: this limits oral rx options.  Hypoglycemia: new, mild.   Patient is advised the following: Patient Instructions  check your blood sugar once a day.  vary the time of day when you check, between before the 3 meals, and at bedtime.  also check if you have symptoms of your blood sugar being too high or too low.  please keep a record of the readings and bring it to your next appointment here.  You can write it on any piece of paper.  please call us sooner if your blood sugar goes below 70, or if you have a lot of readings over 200.   Please continue the same metformin, and: Try taking just  1/4 of a tradjenta per day Please come back for a follow-up appointment in 6 months.

## 2015-08-13 NOTE — Patient Instructions (Addendum)
check your blood sugar once a day.  vary the time of day when you check, between before the 3 meals, and at bedtime.  also check if you have symptoms of your blood sugar being too high or too low.  please keep a record of the readings and bring it to your next appointment here.  You can write it on any piece of paper.  please call us sooner if your blood sugar goes below 70, or if you have a lot of readings over 200.   Please continue the same metformin, and: Try taking just 1/4 of a tradjenta per day Please come back for a follow-up appointment in 6 months.

## 2015-08-14 MED FILL — cloNIDine 0.1 MG/24HR PTWK: 0.1 | 28 days supply | Qty: 4 | Fill #0

## 2015-08-19 DIAGNOSIS — M5136 Other intervertebral disc degeneration, lumbar region: Secondary | ICD-10-CM | POA: Diagnosis not present

## 2015-08-19 DIAGNOSIS — M5416 Radiculopathy, lumbar region: Secondary | ICD-10-CM | POA: Diagnosis not present

## 2015-08-19 MED FILL — HYDROCODON-APAP 5-325: 5-325 | 15 days supply | Qty: 30 | Fill #0

## 2015-08-20 MED FILL — ATORVASTATIN 10 MG TABLET: 10 | 30 days supply | Qty: 30 | Fill #0

## 2015-08-20 MED FILL — metFORMIN HCL 500 MG TABS: 500 | 30 days supply | Qty: 120 | Fill #0

## 2015-08-24 MED FILL — ZOLPIDEM TARTRATE 5 MG TAB: 5 | 90 days supply | Qty: 90 | Fill #0

## 2015-08-24 MED FILL — ARIPiprazole 5 MG TABS: 5 | 90 days supply | Qty: 90 | Fill #1

## 2015-08-25 MED FILL — VALSARTAN-HCTZ 160-12.5 MG: 160-12.5 | 90 days supply | Qty: 20 | Fill #0

## 2015-08-28 DIAGNOSIS — F251 Schizoaffective disorder, depressive type: Secondary | ICD-10-CM | POA: Diagnosis not present

## 2015-08-28 MED FILL — valACYclovir HCL 1 GM TABS: 1 | 30 days supply | Qty: 30 | Fill #1

## 2015-09-07 DIAGNOSIS — H524 Presbyopia: Secondary | ICD-10-CM | POA: Diagnosis not present

## 2015-09-07 LAB — HM DIABETES EYE EXAM

## 2015-09-07 MED FILL — TRINTELLIX 10 MG TABLET: 10 | 90 days supply | Qty: 90 | Fill #0

## 2015-09-08 ENCOUNTER — Encounter: Payer: Self-pay | Admitting: Endocrinology

## 2015-09-11 MED FILL — METOPROLOL TARTRATE 25 MG T: 25 | 90 days supply | Qty: 90 | Fill #1

## 2015-09-11 MED FILL — rOPINIRole HCL 2 MG TABS: 2 | 90 days supply | Qty: 180 | Fill #0

## 2015-09-11 MED FILL — GABAPENTIN 300 MG CAPSULE: 300 | 30 days supply | Qty: 90 | Fill #1

## 2015-09-11 MED FILL — AMLODIPINE BESYLATE 5 MG TA: 5 | 90 days supply | Qty: 90 | Fill #3

## 2015-09-14 MED FILL — PARoxetine HCL 10 MG TABS: 10 | 30 days supply | Qty: 30 | Fill #0

## 2015-09-14 MED FILL — cloNIDine 0.1 MG/24HR PTWK: 0.1 | 28 days supply | Qty: 4 | Fill #1

## 2015-09-15 ENCOUNTER — Other Ambulatory Visit: Payer: Self-pay | Admitting: Pharmacist

## 2015-09-15 ENCOUNTER — Encounter: Payer: Self-pay | Admitting: Pharmacist

## 2015-09-15 NOTE — Progress Notes (Signed)
Subjective:  Patient presents today for 3 month diabetes follow-up as part of the employer-sponsored Link to Wellness program.  Current diabetes regimen includes metformin 500mg  bid and tradjenta 1/4 tablet. Patient also continues on daily ASA, ACE Inhibitor and statin. Patient has a pending appt for today or next week depending on MD availability. Recently changed from [place] to paroxetine tablet, added clonidine patch, reduced Tradjenta to 1/4 tablet.     Assessment:  Diabetes: Most recent A1C was 6.2% which is at goal of less than 7%. Weight is stable from last visit with me.   Lifestyle improvements:  Physical Activity- Deanna Rowe is exercising Monday through Friday on a treadmill for an hour a day.   Nutrition- Nutrition has stayed consistent and she reports taking in the same food as previous. Per patient report, BF is cereal, lunch is chili or salad, dinner is Baked Chicken/Fish.   Follow up with me in 3 months.    Plan/Goals for Next Visit:  1. Keep up the exercise. Try to reduce calories to lose weight. 2. Continue medications as they are, talk with Dr. Drema Dallas and Dr. Loanne Drilling about potential for removing medications as weight and blood glucose are better controlled.

## 2015-09-23 DIAGNOSIS — M5416 Radiculopathy, lumbar region: Secondary | ICD-10-CM | POA: Diagnosis not present

## 2015-09-23 MED FILL — metFORMIN HCL 500 MG TABS: 500 | 30 days supply | Qty: 120 | Fill #0

## 2015-09-25 DIAGNOSIS — E119 Type 2 diabetes mellitus without complications: Secondary | ICD-10-CM | POA: Diagnosis not present

## 2015-09-25 DIAGNOSIS — E7800 Pure hypercholesterolemia, unspecified: Secondary | ICD-10-CM | POA: Diagnosis not present

## 2015-09-25 MED FILL — BUPROPION HCL XL 150 MG TAB: 150 | 90 days supply | Qty: 270 | Fill #0

## 2015-09-25 MED FILL — valACYclovir HCL 1 GM TABS: 1 | 30 days supply | Qty: 30 | Fill #0

## 2015-09-28 DIAGNOSIS — E7800 Pure hypercholesterolemia, unspecified: Secondary | ICD-10-CM | POA: Diagnosis not present

## 2015-09-28 MED FILL — ACCU-CHEK AVIVA PLUS TEST S: 90 days supply | Qty: 300 | Fill #1

## 2015-09-28 MED FILL — ACCU-CHEK MULTICLIX LANCETS: 90 days supply | Qty: 306 | Fill #1

## 2015-09-29 ENCOUNTER — Telehealth: Payer: Self-pay | Admitting: Interventional Cardiology

## 2015-09-29 ENCOUNTER — Telehealth: Payer: Self-pay | Admitting: Endocrinology

## 2015-09-29 NOTE — Telephone Encounter (Signed)
PT said that her blood sugars have been going up and A1C is higher, does she need to go back to taking 1/2 tablet of Tradjenta?

## 2015-09-29 NOTE — Telephone Encounter (Signed)
See note below I contacted the pt.  Pt stated her glucose readings have been. 09/29/2015: Fasting 124 09/28/2015: 8 am: 135 12:00 pm: 146 09/27/2015: 12:00 pm 125 11:58 pm: 161  Could you advise during Dr. Cordelia Pen absence? Thanks!

## 2015-09-29 NOTE — Telephone Encounter (Signed)
Walk in pt Form-Sealed Envelope-Dropped off [place] Back Wednesday 09/30/15

## 2015-09-29 NOTE — Telephone Encounter (Signed)
She can go back to half tablet Tradjenta

## 2015-09-29 NOTE — Telephone Encounter (Signed)
Attempted to reach the pt. Pt was unavailable  and did not have vm set up at this time.

## 2015-09-30 NOTE — Telephone Encounter (Signed)
**Note De-Identified  Obfuscation** LMTCB. The pts [place] (physical fitness testing) paperwork has been completed and ready for the pt to pick up from the front office.

## 2015-09-30 NOTE — Telephone Encounter (Signed)
I contacted the pt and advised of instructions below. Pt voiced understanding.

## 2015-10-12 MED FILL — PARoxetine HCL 10 MG TABS: 10 | 30 days supply | Qty: 30 | Fill #1

## 2015-10-13 DIAGNOSIS — M7062 Trochanteric bursitis, left hip: Secondary | ICD-10-CM | POA: Diagnosis not present

## 2015-10-13 DIAGNOSIS — Z471 Aftercare following joint replacement surgery: Secondary | ICD-10-CM | POA: Diagnosis not present

## 2015-10-13 DIAGNOSIS — Z96642 Presence of left artificial hip joint: Secondary | ICD-10-CM | POA: Diagnosis not present

## 2015-10-13 MED FILL — PANTOPRAZOLE SOD DR 40 MG T: 40 | 90 days supply | Qty: 180 | Fill #0

## 2015-10-13 MED FILL — ATORVASTATIN 10 MG TABLET: 10 | 90 days supply | Qty: 90 | Fill #0

## 2015-10-14 MED FILL — GABAPENTIN 300 MG CAPSULE: 300 | 90 days supply | Qty: 90 | Fill #0

## 2015-10-19 MED FILL — TRADJENTA 5 MG TABLET: 5 | 88 days supply | Qty: 22 | Fill #1

## 2015-10-19 MED FILL — metFORMIN HCL 1000 MG TABS: 1000 | 90 days supply | Qty: 360 | Fill #0

## 2015-10-27 DIAGNOSIS — Z01419 Encounter for gynecological examination (general) (routine) without abnormal findings: Secondary | ICD-10-CM | POA: Diagnosis not present

## 2015-10-28 ENCOUNTER — Other Ambulatory Visit: Payer: Self-pay

## 2015-10-28 DIAGNOSIS — Z0000 Encounter for general adult medical examination without abnormal findings: Secondary | ICD-10-CM | POA: Diagnosis not present

## 2015-10-28 MED ORDER — LINAGLIPTIN 5 MG PO TABS: ORAL_TABLET | ORAL | 5 refills | Status: DC

## 2015-10-28 MED FILL — TRINTELLIX 20 MG TABLET: 20 | 90 days supply | Qty: 90 | Fill #0

## 2015-10-29 MED FILL — VALACYCLOVIR HCL 500 MG TAB: 500 | 90 days supply | Qty: 90 | Fill #0

## 2015-11-03 DIAGNOSIS — M7062 Trochanteric bursitis, left hip: Secondary | ICD-10-CM | POA: Diagnosis not present

## 2015-11-03 DIAGNOSIS — M7061 Trochanteric bursitis, right hip: Secondary | ICD-10-CM | POA: Diagnosis not present

## 2015-11-03 MED FILL — clonazePAM 1 MG TABS: 1 | 90 days supply | Qty: 180 | Fill #1

## 2015-11-10 DIAGNOSIS — M7062 Trochanteric bursitis, left hip: Secondary | ICD-10-CM | POA: Diagnosis not present

## 2015-11-20 MED FILL — ARIPiprazole 5 MG TABS: 5 | 90 days supply | Qty: 90 | Fill #0

## 2015-11-20 MED FILL — ZOLPIDEM TARTRATE 5 MG TAB: 5 | 90 days supply | Qty: 90 | Fill #1

## 2015-11-30 DIAGNOSIS — J302 Other seasonal allergic rhinitis: Secondary | ICD-10-CM | POA: Diagnosis not present

## 2015-11-30 MED FILL — GABAPENTIN 300 MG CAPSULE: 300 | 30 days supply | Qty: 60 | Fill #0

## 2015-11-30 MED FILL — ALL DAY ALLERGY 10 MG TAB: 10 | 100 days supply | Qty: 100 | Fill #0

## 2015-12-01 ENCOUNTER — Ambulatory Visit (INDEPENDENT_AMBULATORY_CARE_PROVIDER_SITE_OTHER): Payer: 59 | Admitting: Endocrinology

## 2015-12-01 VITALS — BP 110/62 | HR 62 | Ht 66.0 in | Wt 199.0 lb

## 2015-12-01 DIAGNOSIS — R3989 Other symptoms and signs involving the genitourinary system: Secondary | ICD-10-CM

## 2015-12-01 DIAGNOSIS — N183 Chronic kidney disease, stage 3 unspecified: Secondary | ICD-10-CM

## 2015-12-01 DIAGNOSIS — E1122 Type 2 diabetes mellitus with diabetic chronic kidney disease: Secondary | ICD-10-CM

## 2015-12-01 DIAGNOSIS — N390 Urinary tract infection, site not specified: Secondary | ICD-10-CM | POA: Insufficient documentation

## 2015-12-01 LAB — POCT GLYCOSYLATED HEMOGLOBIN (HGB A1C): Hemoglobin A1C: 6.1

## 2015-12-01 LAB — URINALYSIS, ROUTINE W REFLEX MICROSCOPIC
Bilirubin Urine: NEGATIVE
Hgb urine dipstick: NEGATIVE
Ketones, ur: NEGATIVE
Nitrite: NEGATIVE
RBC / HPF: NONE SEEN (ref 0–?)
Specific Gravity, Urine: 1.015 (ref 1.000–1.030)
Total Protein, Urine: NEGATIVE
Urine Glucose: NEGATIVE
Urobilinogen, UA: 0.2 (ref 0.0–1.0)
pH: 5.5 (ref 5.0–8.0)

## 2015-12-01 MED FILL — VALSARTAN-HCTZ 160-12.5 MG: 160-12.5 | 90 days supply | Qty: 20 | Fill #1

## 2015-12-01 MED FILL — AMLODIPINE BESYLATE 5 MG TA: 5 | 90 days supply | Qty: 90 | Fill #0

## 2015-12-01 NOTE — Progress Notes (Signed)
Subjective:    Patient ID: Deanna Rowe, female    DOB: 20-Apr-1955, 60 y.o.   MRN: [id number]  HPI Pt returns for f/u of diabetes mellitus: DM type: 2 Dx'ed: 0000000 Complications: renal insufficiency.  Therapy: 2 oral meds GDM: never DKA: never Severe hypoglycemia: never Pancreatitis: never Other: she has never been on insulin Interval history: pt states cbg's are well-controlled.  She has few days of foul-smelling urine. Past Medical History:  Diagnosis Date  . Anxiety   . Arthritis    L HIP  . Avascular necrosis of hip (HCC)    LEFT  . Depression   . Diabetes mellitus   . Diabetes mellitus, type II ([place])   . Dry cough   . GERD (gastroesophageal reflux disease)   . Hypertension   . Insomnia    takes Ambien nightly  . Obesity   . PONV (postoperative nausea and vomiting)   . TIA (transient ischemic attack) 2012   no residual problems  . Urgency incontinence     Past Surgical History:  Procedure Laterality Date  . DILATION AND CURETTAGE OF UTERUS    . ESOPHAGEAL MANOMETRY N/A 09/03/2012   Procedure: ESOPHAGEAL MANOMETRY (EM);  Surgeon: Garlan Fair, MD;  Location: [facility];  Service: Endoscopy;  Laterality: N/A;  . EXPLORATORY LAPAROTOMY    . JOINT REPLACEMENT  2011   rt total hip  . PILONIDAL CYST / SINUS EXCISION    . 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 Marital status: Married    Spouse name: N/A  . Number of children: N/A  . Years of education: N/A   Occupational History  . Not on file.   Social History Main Topics  . Smoking status: Never Smoker  . Smokeless tobacco: Never Used  . Alcohol use No  . Drug use: No  . Sexual activity: Not Currently   Other Topics Concern  . Not on file   Social History Narrative  . No narrative on file    Current  Outpatient Prescriptions on File Prior to Visit  Medication Sig Dispense Refill  . albuterol (PROVENTIL HFA;VENTOLIN HFA) 108 (90 BASE) MCG/ACT inhaler Inhale 2 puffs into the lungs as needed for wheezing or shortness of breath (every 4 hours as needed for cough, wheezing and chest tightness).    Marland Kitchen amLODipine (NORVASC) 5 MG tablet Take 5 mg by mouth every morning.     . ARIPiprazole (ABILIFY) 5 MG tablet Take 5 mg by mouth every evening.     Marland Kitchen aspirin EC 81 MG tablet Take 81 mg by mouth every evening.    Marland Kitchen atorvastatin (LIPITOR) 10 MG tablet Take 10 mg by mouth daily.  2  . buPROPion (WELLBUTRIN XL) 150 MG 24 hr tablet Take 450 mg by mouth every morning.     Marland Kitchen CALCIUM-VITAMIN D PO Take 1 tablet by mouth daily.    . clonazePAM (KLONOPIN) 1 MG tablet Take 1 mg by mouth 2 (two) times daily.  1  . gabapentin (NEURONTIN) 300 MG capsule Take 300 mg by mouth 3 (three) times daily.     Marland Kitchen glucose blood (ACCU-CHEK AVIVA) test strip 1 each by Other route daily. and Lancets 3/day 100 each 12  . linagliptin (TRADJENTA) 5 MG TABS tablet 1/2 tab daily 30 tablet 5  .  metFORMIN (GLUCOPHAGE) 500 MG tablet Take 1,000 mg by mouth 2 (two) times daily with a meal.     . metoprolol tartrate (LOPRESSOR) 25 MG tablet Take 0.5 tablets (12.5 mg total) by mouth every morning. 90 tablet 3  . pantoprazole (PROTONIX) 40 MG tablet Take 40 mg by mouth 2 (two) times daily.     Marland Kitchen rOPINIRole (REQUIP) 2 MG tablet Take 2 mg by mouth 2 (two) times daily.  3  . valACYclovir (VALTREX) 1000 MG tablet Take 1,000 mg by mouth daily.    . valsartan-hydrochlorothiazide (DIOVAN-HCT) 160-12.5 MG per tablet Take 0.5 tablets by mouth 3 (three) times a week. MWF    . vitamin C (ASCORBIC ACID) 500 MG tablet Take 500 mg by mouth daily.    Marland Kitchen VITAMIN D, ERGOCALCIFEROL, PO Take 1 capsule by mouth daily. 2000 units daily.    . Vortioxetine HBr (TRINTELLIX) 10 MG TABS Take 1 tablet by mouth.     . zolpidem (AMBIEN) 5 MG tablet Take 5 mg by mouth at  bedtime.     . cloNIDine (CATAPRES - DOSED IN MG/24 HR) 0.1 mg/24hr patch Place 0.1 mg onto the skin once a week.    Marland Kitchen PARoxetine (PAXIL) 10 MG tablet Take 10 mg by mouth daily.     Current Facility-Administered Medications on File Prior to Visit  Medication Dose Route Frequency Provider Last Rate Last Dose  . tranexamic acid (CYKLOKAPRON) topical -INTRAOP  2,000 mg Topical Once [employer], PA-C        Allergies  Allergen Reactions  . Codeine Nausea And Vomiting  . Fetzima [Levomilnacipran] Nausea And Vomiting  . Trulicity [Dulaglutide] Nausea And Vomiting    Significant and undesirably rapid (40 lbs) weight loss   . Xanax [Alprazolam] Other (See Comments)    disoriented  . Amoxicillin Rash    Family History  Problem Relation Age of Onset  . Alcohol abuse Brother   . Alcohol abuse Brother   . Alcohol abuse Brother   . Alcohol abuse Brother   . Diabetes Neg Hx     BP 110/62   Pulse 62   Ht 5\' 6"  (1.676 m)   Wt 199 lb (90.3 kg)   BMI 32.12 kg/m    Review of Systems Denies fever and dysuria.      Objective:   Physical Exam VITAL SIGNS:  See vs page GENERAL: no distress Pulses: dorsalis pedis intact bilat.   MSK: no deformity of the feet CV: no leg edema Skin:  no ulcer on the feet.  normal color and temp on the feet. Neuro: sensation is intact to touch on the feet.    A1c=6.1% UA pos for UTI    Assessment & Plan:  Type 2 DM: well-controlled Pyuria, new, uncertain etiology.  Check c and s

## 2015-12-01 NOTE — Patient Instructions (Addendum)
check your blood sugar once a day.  vary the time of day when you check, between before the 3 meals, and at bedtime.  also check if you have symptoms of your blood sugar being too high or too low.  please keep a record of the readings and bring it to your next appointment here.  You can write it on any piece of paper.  please call us sooner if your blood sugar goes below 70, or if you have a lot of readings over 200.   Please continue the same medications. A urine test is requested for you today.  We'll let you know about the results. Please come back for a follow-up appointment in 6 months.

## 2015-12-04 ENCOUNTER — Other Ambulatory Visit: Payer: Self-pay | Admitting: Endocrinology

## 2015-12-04 MED ORDER — DOXYCYCLINE HYCLATE 100 MG PO TABS: 100.0000 mg | ORAL_TABLET | Freq: Two times a day (BID) | ORAL | 0 refills | Status: DC

## 2015-12-04 MED ORDER — CEFUROXIME AXETIL 500 MG PO TABS: 500.0000 mg | ORAL_TABLET | Freq: Two times a day (BID) | ORAL | 0 refills | Status: DC

## 2015-12-08 MED FILL — DOXYCYCLINE HYCLATE 100 MG: 100 | 10 days supply | Qty: 20 | Fill #0

## 2015-12-09 MED FILL — TRADJENTA 5 MG TABLET: 5 | 60 days supply | Qty: 30 | Fill #0

## 2015-12-09 MED FILL — rOPINIRole HCL 2 MG TABS: 2 | 90 days supply | Qty: 180 | Fill #1

## 2015-12-10 MED FILL — ESTRADIOL-NORETH 1.0-0.5 MG: 1-0.5 | 28 days supply | Qty: 28 | Fill #0

## 2015-12-15 MED FILL — TRANSDERM-SCOP 1.5 MG/3 DAY: 1 | 3 days supply | Qty: 1 | Fill #0

## 2015-12-15 MED FILL — BUPROPION HCL XL 150 MG TAB: 150 | 90 days supply | Qty: 270 | Fill #0

## 2015-12-17 ENCOUNTER — Other Ambulatory Visit: Payer: Self-pay | Admitting: Pharmacist

## 2015-12-17 ENCOUNTER — Encounter: Payer: Self-pay | Admitting: Pharmacist

## 2015-12-17 NOTE — Patient Outreach (Signed)
[facility]) Care Management  Subjective:  Patient presents today for 3 month diabetes follow-up as part of the employer-sponsored Link to Wellness program.  Current diabetes regimen includes Tradjenta and metformin.  Patient also continues on daily ASA, ACE Inhibitor and statin.  Most recent MD follow-up was 12/01/15.  Patient has a pending appt for 02/2016.  No med changes or major health changes at this time.    Assessment:  Diabetes: Most recent A1C was 6.1% which is at goal of less than 7%. Weight is stable from last visit with me (up 2 lbs).  Lifestyle improvements:  Physical Activity/Nutrition- Deanna Rowe and her husband have been regularly exercising and eating fewer carbohydrates since his bypass surgery.  She reports less eating out and pizza/burgers.  Follow up with me in 3 months.    Plan/Goals for Next Visit:  1. Continue to follow post-bypass diet. 2. Keep up the consistency with working out. 3. Remember your successes, focus and let them inspire you to more!    Next appointment to see me is: 3 months  12/17/2015  Deanna Rowe [birth year] [id number]

## 2015-12-29 DIAGNOSIS — F251 Schizoaffective disorder, depressive type: Secondary | ICD-10-CM | POA: Diagnosis not present

## 2016-01-05 MED FILL — GABAPENTIN 300 MG CAPSULE: 300 | 30 days supply | Qty: 60 | Fill #1

## 2016-01-05 MED FILL — ATORVASTATIN 10 MG TABLET: 10 | 90 days supply | Qty: 90 | Fill #1

## 2016-01-06 ENCOUNTER — Other Ambulatory Visit: Payer: Self-pay | Admitting: Family Medicine

## 2016-01-06 DIAGNOSIS — Z1231 Encounter for screening mammogram for malignant neoplasm of breast: Secondary | ICD-10-CM

## 2016-01-06 MED FILL — ESTRADIOL-NORETH 1.0-0.5 MG: 1-0.5 | 28 days supply | Qty: 28 | Fill #1

## 2016-01-19 MED FILL — TRANSDERM-SCOP 1.5 MG/3 DAY: 1 | 12 days supply | Qty: 4 | Fill #0

## 2016-01-20 MED FILL — VALACYCLOVIR HCL 500 MG TAB: 500 | 90 days supply | Qty: 90 | Fill #1

## 2016-01-20 MED FILL — TRINTELLIX 20 MG TABLET: 20 | 90 days supply | Qty: 90 | Fill #0

## 2016-01-20 MED FILL — PANTOPRAZOLE SOD DR 40 MG T: 40 | 90 days supply | Qty: 180 | Fill #0

## 2016-01-20 MED FILL — metFORMIN HCL 1000 MG TABS: 1000 | 90 days supply | Qty: 360 | Fill #0

## 2016-01-22 DIAGNOSIS — Z23 Encounter for immunization: Secondary | ICD-10-CM | POA: Diagnosis not present

## 2016-01-25 MED FILL — ACCU-CHEK MULTICLIX LANCETS: 90 days supply | Qty: 306 | Fill #2

## 2016-01-25 MED FILL — ACCU-CHEK AVIVA PLUS TEST S: 90 days supply | Qty: 300 | Fill #2

## 2016-01-27 MED FILL — ESTRADIOL-NORETH 1.0-0.5 MG: 1-0.5 | 28 days supply | Qty: 28 | Fill #2

## 2016-01-28 MED FILL — GABAPENTIN 300 MG CAPSULE: 300 | 30 days supply | Qty: 60 | Fill #2

## 2016-02-11 MED FILL — TRADJENTA 5 MG TABLET: 5 | 60 days supply | Qty: 30 | Fill #1

## 2016-02-11 MED FILL — clonazePAM 1 MG TABS: 1 | 90 days supply | Qty: 180 | Fill #0

## 2016-02-12 ENCOUNTER — Ambulatory Visit (INDEPENDENT_AMBULATORY_CARE_PROVIDER_SITE_OTHER): Payer: 59 | Admitting: Endocrinology

## 2016-02-12 ENCOUNTER — Encounter: Payer: Self-pay | Admitting: Endocrinology

## 2016-02-12 VITALS — BP 120/82 | HR 74 | Wt 204.0 lb

## 2016-02-12 DIAGNOSIS — E1149 Type 2 diabetes mellitus with other diabetic neurological complication: Secondary | ICD-10-CM | POA: Diagnosis not present

## 2016-02-12 LAB — POCT GLYCOSYLATED HEMOGLOBIN (HGB A1C): Hemoglobin A1C: 5.9

## 2016-02-12 LAB — MICROALBUMIN / CREATININE URINE RATIO
Creatinine,U: 51.3 mg/dL
Microalb Creat Ratio: 1.4 mg/g (ref 0.0–30.0)
Microalb, Ur: <0.7 mg/dL (ref 0.0–1.9)

## 2016-02-12 NOTE — Progress Notes (Signed)
Subjective:    Patient ID: Deanna Rowe, female    DOB: 19-Oct-1955, 60 y.o.   MRN: [id number]  HPI Pt returns for f/u of diabetes mellitus: DM type: 2 Dx'ed: 0000000 Complications: renal insufficiency.  Therapy: 2 oral meds GDM: never DKA: never Severe hypoglycemia: never Pancreatitis: never Other: she has never been on insulin Interval history: pt states cbg's are well-controlled.  She seldom has hypoglycemia, and these episodes are mild.   Past Medical History:  Diagnosis Date  . Anxiety   . Arthritis    L HIP  . Avascular necrosis of hip (HCC)    LEFT  . Depression   . Diabetes mellitus   . Diabetes mellitus, type II ([place])   . Dry cough   . GERD (gastroesophageal reflux disease)   . Hypertension   . Insomnia    takes Ambien nightly  . Obesity   . PONV (postoperative nausea and vomiting)   . TIA (transient ischemic attack) 2012   no residual problems  . Urgency incontinence     Past Surgical History:  Procedure Laterality Date  . DILATION AND CURETTAGE OF UTERUS    . ESOPHAGEAL MANOMETRY N/A 09/03/2012   Procedure: ESOPHAGEAL MANOMETRY (EM);  Surgeon: Garlan Fair, MD;  Location: [facility];  Service: Endoscopy;  Laterality: N/A;  . EXPLORATORY LAPAROTOMY    . JOINT REPLACEMENT  2011   rt total hip  . PILONIDAL CYST / SINUS EXCISION    . 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 Marital status: Married    Spouse name: N/A  . Number of children: N/A  . Years of education: N/A   Occupational History  . Not on file.   Social History Main Topics  . Smoking status: Never Smoker  . Smokeless tobacco: Never Used  . Alcohol use No  . Drug use: No  . Sexual activity: Not Currently   Other Topics Concern  . Not on file   Social History Narrative  . No narrative on  file    Current Outpatient Prescriptions on File Prior to Visit  Medication Sig Dispense Refill  . albuterol (PROVENTIL HFA;VENTOLIN HFA) 108 (90 BASE) MCG/ACT inhaler Inhale 2 puffs into the lungs as needed for wheezing or shortness of breath (every 4 hours as needed for cough, wheezing and chest tightness).    Marland Kitchen amLODipine (NORVASC) 5 MG tablet Take 5 mg by mouth every morning.     . ARIPiprazole (ABILIFY) 5 MG tablet Take 5 mg by mouth every evening.     Marland Kitchen aspirin EC 81 MG tablet Take 81 mg by mouth every evening.    Marland Kitchen atorvastatin (LIPITOR) 10 MG tablet Take 10 mg by mouth daily.  2  . buPROPion (WELLBUTRIN XL) 150 MG 24 hr tablet Take 450 mg by mouth every morning.     Marland Kitchen CALCIUM-VITAMIN D PO Take 1 tablet by mouth daily.    . cetirizine (ZYRTEC) 10 MG tablet Take 10 mg by mouth daily.    . clonazePAM (KLONOPIN) 1 MG tablet Take 1 mg by mouth 2 (two) times daily.  1  . estradiol-norethindrone (ACTIVELLA) 1-0.5 MG tablet Take 1 tablet by mouth daily.    Marland Kitchen gabapentin (NEURONTIN) 300 MG capsule Take 300 mg by mouth 2 (two) times daily.     Marland Kitchen  glucose blood (ACCU-CHEK AVIVA) test strip 1 each by Other route daily. and Lancets 3/day 100 each 12  . linagliptin (TRADJENTA) 5 MG TABS tablet 1/2 tab daily 30 tablet 5  . metFORMIN (GLUCOPHAGE) 1000 MG tablet Take 2,000 mg by mouth 2 (two) times daily with a meal.     . metoprolol tartrate (LOPRESSOR) 25 MG tablet Take 0.5 tablets (12.5 mg total) by mouth every morning. 90 tablet 3  . pantoprazole (PROTONIX) 40 MG tablet Take 40 mg by mouth 2 (two) times daily.     Marland Kitchen rOPINIRole (REQUIP) 2 MG tablet Take 2 mg by mouth 2 (two) times daily.  3  . valACYclovir (VALTREX) 1000 MG tablet Take 500 mg by mouth daily.     . valsartan-hydrochlorothiazide (DIOVAN-HCT) 160-12.5 MG per tablet Take 0.5 tablets by mouth 3 (three) times a week. MWF    . vitamin C (ASCORBIC ACID) 500 MG tablet Take 500 mg by mouth daily.    Marland Kitchen VITAMIN D, ERGOCALCIFEROL, PO Take 1  capsule by mouth daily. 2000 units daily.    Marland Kitchen vortioxetine HBr (TRINTELLIX) 20 MG TABS Take 20 mg by mouth daily.    Marland Kitchen zolpidem (AMBIEN) 5 MG tablet Take 5 mg by mouth at bedtime.      Current Facility-Administered Medications on File Prior to Visit  Medication Dose Route Frequency Provider Last Rate Last Dose  . tranexamic acid (CYKLOKAPRON) topical -INTRAOP  2,000 mg Topical Once [employer], PA-C        Allergies  Allergen Reactions  . Codeine Nausea And Vomiting  . Fetzima [Levomilnacipran] Nausea And Vomiting  . Trulicity [Dulaglutide] Nausea And Vomiting    Significant and undesirably rapid (40 lbs) weight loss   . Xanax [Alprazolam] Other (See Comments)    disoriented  . Amoxicillin Rash    Family History  Problem Relation Age of Onset  . Alcohol abuse Brother   . Alcohol abuse Brother   . Alcohol abuse Brother   . Alcohol abuse Brother   . Diabetes Neg Hx     BP 120/82   Pulse 74   Wt 204 lb (92.5 kg)   SpO2 96%   BMI 32.93 kg/m    Review of Systems No weight change    Objective:   Physical Exam VITAL SIGNS:  See vs page GENERAL: no distress Pulses: dorsalis pedis intact bilat.   MSK: no deformity of the feet CV: no leg edema Skin:  no ulcer on the feet.  normal color and temp on the feet. Neuro: sensation is intact to touch on the feet   Lab Results  Component Value Date   HGBA1C 5.9 02/12/2016      Assessment & Plan:  Type 2 DM, with renal insufficiency: well-controlled.    Patient is advised the following: Patient Instructions  check your blood sugar once a day.  vary the time of day when you check, between before the 3 meals, and at bedtime.  also check if you have symptoms of your blood sugar being too high or too low.  please keep a record of the readings and bring it to your next appointment here.  You can write it on any piece of paper.  please call us sooner if your blood sugar goes below 70, or if you have a lot of readings over 200.    Please continue the same medications. A urine test is requested for you today.  We'll let you know about the results. Please come back for  a follow-up appointment in 6 months.

## 2016-02-12 NOTE — Patient Instructions (Signed)
check your blood sugar once a day.  vary the time of day when you check, between before the 3 meals, and at bedtime.  also check if you have symptoms of your blood sugar being too high or too low.  please keep a record of the readings and bring it to your next appointment here.  You can write it on any piece of paper.  please call us sooner if your blood sugar goes below 70, or if you have a lot of readings over 200.   Please continue the same medications. A urine test is requested for you today.  We'll let you know about the results. Please come back for a follow-up appointment in 6 months.

## 2016-02-15 ENCOUNTER — Ambulatory Visit
Admission: RE | Admit: 2016-02-15 | Discharge: 2016-02-15 | Disposition: A | Discharge status: Home / Self Care | Payer: 59 | Source: Ambulatory Visit | Attending: Family Medicine | Admitting: Family Medicine

## 2016-02-15 DIAGNOSIS — Z1231 Encounter for screening mammogram for malignant neoplasm of breast: Secondary | ICD-10-CM | POA: Diagnosis not present

## 2016-02-23 MED FILL — ARIPiprazole 5 MG TABS: 5 | 90 days supply | Qty: 90 | Fill #1

## 2016-02-23 MED FILL — METOPROLOL TARTRATE 25 MG T: 25 | 90 days supply | Qty: 90 | Fill #2

## 2016-02-23 MED FILL — GABAPENTIN 300 MG CAPSULE: 300 | 30 days supply | Qty: 60 | Fill #3

## 2016-02-24 MED FILL — ZOLPIDEM TARTRATE 5 MG TAB: 5 | 90 days supply | Qty: 90 | Fill #0

## 2016-02-29 MED FILL — AMLODIPINE BESYLATE 5 MG TA: 5 | 90 days supply | Qty: 90 | Fill #1

## 2016-03-02 ENCOUNTER — Encounter (HOSPITAL_BASED_OUTPATIENT_CLINIC_OR_DEPARTMENT_OTHER): Payer: Self-pay | Admitting: *Deleted

## 2016-03-02 ENCOUNTER — Emergency Department (HOSPITAL_BASED_OUTPATIENT_CLINIC_OR_DEPARTMENT_OTHER)
Admission: EM | Admit: 2016-03-02 | Discharge: 2016-03-02 | Disposition: A | Discharge status: Home / Self Care | Payer: 59 | Attending: Emergency Medicine | Admitting: Emergency Medicine

## 2016-03-02 ENCOUNTER — Emergency Department (HOSPITAL_BASED_OUTPATIENT_CLINIC_OR_DEPARTMENT_OTHER): Payer: 59

## 2016-03-02 DIAGNOSIS — Z7984 Long term (current) use of oral hypoglycemic drugs: Secondary | ICD-10-CM | POA: Diagnosis not present

## 2016-03-02 DIAGNOSIS — E119 Type 2 diabetes mellitus without complications: Secondary | ICD-10-CM | POA: Insufficient documentation

## 2016-03-02 DIAGNOSIS — R0789 Other chest pain: Secondary | ICD-10-CM | POA: Diagnosis not present

## 2016-03-02 DIAGNOSIS — Y999 Unspecified external cause status: Secondary | ICD-10-CM | POA: Insufficient documentation

## 2016-03-02 DIAGNOSIS — S299XXA Unspecified injury of thorax, initial encounter: Secondary | ICD-10-CM | POA: Diagnosis not present

## 2016-03-02 DIAGNOSIS — Y9389 Activity, other specified: Secondary | ICD-10-CM | POA: Insufficient documentation

## 2016-03-02 DIAGNOSIS — I10 Essential (primary) hypertension: Secondary | ICD-10-CM | POA: Diagnosis not present

## 2016-03-02 DIAGNOSIS — Z7982 Long term (current) use of aspirin: Secondary | ICD-10-CM | POA: Diagnosis not present

## 2016-03-02 DIAGNOSIS — Z79899 Other long term (current) drug therapy: Secondary | ICD-10-CM | POA: Insufficient documentation

## 2016-03-02 DIAGNOSIS — S20212A Contusion of left front wall of thorax, initial encounter: Secondary | ICD-10-CM | POA: Insufficient documentation

## 2016-03-02 DIAGNOSIS — Y92410 Unspecified street and highway as the place of occurrence of the external cause: Secondary | ICD-10-CM | POA: Diagnosis not present

## 2016-03-02 DIAGNOSIS — S169XXA Unspecified injury of muscle, fascia and tendon at neck level, initial encounter: Secondary | ICD-10-CM | POA: Diagnosis not present

## 2016-03-02 DIAGNOSIS — R079 Chest pain, unspecified: Secondary | ICD-10-CM | POA: Diagnosis not present

## 2016-03-02 MED ORDER — ACETAMINOPHEN 325 MG PO TABS
650.0000 mg | ORAL_TABLET | Freq: Once | ORAL | Status: AC
  Administered 2016-03-02: 650 mg via ORAL
  Filled 2016-03-02: qty 2

## 2016-03-02 MED ORDER — LORAZEPAM 1 MG PO TABS
0.5000 mg | ORAL_TABLET | Freq: Once | ORAL | Status: AC
  Administered 2016-03-02: 0.5 mg via ORAL
  Filled 2016-03-02: qty 1

## 2016-03-02 NOTE — Discharge Instructions (Signed)
It was our pleasure to provide your ER care today - we hope that you feel better.  Rest.   Take tylenol/advil as need.  Follow up with primary care doctor in 1 week if symptoms fail to improve/resolve.  Return to ER if worse, new symptoms, trouble breathing, new or severe pain, other concern.   You were given medication in the ER - no driving for the next 6 hours.
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
## 2016-03-14 DIAGNOSIS — M778 Other enthesopathies, not elsewhere classified: Secondary | ICD-10-CM | POA: Diagnosis not present

## 2016-03-16 MED FILL — VALSARTAN-HCTZ 160-12.5 MG: 160-12.5 | 90 days supply | Qty: 20 | Fill #2

## 2016-03-16 MED FILL — rOPINIRole HCL 2 MG TABS: 2 | 90 days supply | Qty: 180 | Fill #0

## 2016-03-22 MED FILL — BUPROPION HCL XL 150 MG TAB: 150 | 90 days supply | Qty: 270 | Fill #1

## 2016-03-22 MED FILL — GABAPENTIN 300 MG CAPSULE: 300 | 30 days supply | Qty: 60 | Fill #0

## 2016-03-24 DIAGNOSIS — M1812 Unilateral primary osteoarthritis of first carpometacarpal joint, left hand: Secondary | ICD-10-CM | POA: Diagnosis not present

## 2016-03-24 DIAGNOSIS — M654 Radial styloid tenosynovitis [de Quervain]: Secondary | ICD-10-CM | POA: Diagnosis not present

## 2016-03-24 DIAGNOSIS — M67432 Ganglion, left wrist: Secondary | ICD-10-CM | POA: Diagnosis not present

## 2016-04-07 MED FILL — ATORVASTATIN 10 MG TABLET: 10 | 60 days supply | Qty: 60 | Fill #0

## 2016-04-12 MED FILL — TRADJENTA 5 MG TABLET: 5 | 60 days supply | Qty: 30 | Fill #2

## 2016-04-14 MED FILL — PANTOPRAZOLE SOD DR 40 MG T: 40 | 90 days supply | Qty: 180 | Fill #0

## 2016-04-16 ENCOUNTER — Ambulatory Visit (INDEPENDENT_AMBULATORY_CARE_PROVIDER_SITE_OTHER): Payer: 59

## 2016-04-16 ENCOUNTER — Encounter (HOSPITAL_COMMUNITY): Payer: Self-pay | Admitting: Emergency Medicine

## 2016-04-16 ENCOUNTER — Ambulatory Visit (HOSPITAL_COMMUNITY)
Admission: EM | Admit: 2016-04-16 | Discharge: 2016-04-16 | Disposition: A | Discharge status: Home / Self Care | Payer: 59 | Attending: Internal Medicine | Admitting: Internal Medicine

## 2016-04-16 DIAGNOSIS — Z471 Aftercare following joint replacement surgery: Secondary | ICD-10-CM | POA: Diagnosis not present

## 2016-04-16 DIAGNOSIS — M79605 Pain in left leg: Secondary | ICD-10-CM | POA: Diagnosis not present

## 2016-04-16 DIAGNOSIS — Z96642 Presence of left artificial hip joint: Secondary | ICD-10-CM | POA: Diagnosis not present

## 2016-04-16 MED ORDER — HYDROCODONE-ACETAMINOPHEN 5-325 MG PO TABS: 2.0000 | ORAL_TABLET | Freq: Four times a day (QID) | ORAL | 0 refills | Status: DC | PRN

## 2016-04-16 MED ORDER — PREDNISONE 5 MG (48) PO TBPK: 5.0000 mg | ORAL_TABLET | Freq: Every day | ORAL | 0 refills | Status: DC

## 2016-04-16 NOTE — ED Provider Notes (Signed)
CSN: [id number]     Arrival date & time 04/16/16  1744 History   None    Chief Complaint  Patient presents with  . Leg Pain   (Consider location/radiation/quality/duration/timing/severity/associated sxs/prior Treatment) 61 yo with left hip "severe" that started last night. Pain to bear weight. No back pain. No trauma no weakness. Pain starts in groin and radiates to left ankle. No swelling or soreness is noted in the thigh. No numbness or tingling. Had a hip replacement for AVN in 2015 by Dr. Lyla Glassing.       Past Medical History:  Diagnosis Date  . Anxiety   . Arthritis    L HIP  . Avascular necrosis of hip (HCC)    LEFT  . Depression   . Diabetes mellitus   . Diabetes mellitus, type II ([place])   . Dry cough   . GERD (gastroesophageal reflux disease)   . Hypertension   . Insomnia    takes Ambien nightly  . Obesity   . PONV (postoperative nausea and vomiting)   . TIA (transient ischemic attack) 2012   no residual problems  . Urgency incontinence    Past Surgical History:  Procedure Laterality Date  . DILATION AND CURETTAGE OF UTERUS    . ESOPHAGEAL MANOMETRY N/A 09/03/2012   Procedure: ESOPHAGEAL MANOMETRY (EM);  Surgeon: Garlan Fair, MD;  Location: [facility];  Service: Endoscopy;  Laterality: N/A;  . EXPLORATORY LAPAROTOMY    . JOINT REPLACEMENT  2011   rt total hip  . PILONIDAL CYST / SINUS EXCISION    . TOTAL HIP ARTHROPLASTY Left 10/09/2013   Procedure: LEFT TOTAL HIP ARTHROPLASTY ANTERIOR APPROACH;  Surgeon: [name], MD;  Location: [facility];  Service: Orthopedics;  Laterality: Left;   Family History  Problem Relation Age of Onset  . Alcohol abuse Brother   . Alcohol abuse Brother   . Alcohol abuse Brother   . Alcohol abuse Brother   . Diabetes Neg Hx    Social History  Substance Use Topics  . Smoking status: Never Smoker  . Smokeless tobacco: Never Used  . Alcohol use No   OB History    No data available     Review of Systems   Constitutional: Negative for chills and fever.  Musculoskeletal: Positive for arthralgias.  Skin: Negative for rash.    Allergies  Codeine; Fetzima [levomilnacipran]; Trulicity [dulaglutide]; Xanax [alprazolam]; and Amoxicillin  Home Medications   Prior to Admission medications   Medication Sig Start Date End Date Taking? Authorizing Provider  albuterol (PROVENTIL HFA;VENTOLIN HFA) 108 (90 BASE) MCG/ACT inhaler Inhale 2 puffs into the lungs as needed for wheezing or shortness of breath (every 4 hours as needed for cough, wheezing and chest tightness).    Historical Provider, MD  amLODipine (NORVASC) 5 MG tablet Take 5 mg by mouth every morning.     Historical Provider, MD  ARIPiprazole (ABILIFY) 5 MG tablet Take 5 mg by mouth every evening.  03/09/12   Leonides Grills, MD  aspirin EC 81 MG tablet Take 81 mg by mouth every evening.    Historical Provider, MD  atorvastatin (LIPITOR) 10 MG tablet Take 10 mg by mouth daily. 04/09/15   Historical Provider, MD  buPROPion (WELLBUTRIN XL) 150 MG 24 hr tablet Take 450 mg by mouth every morning.     Historical Provider, MD  CALCIUM-VITAMIN D PO Take 1 tablet by mouth daily.    Historical Provider, MD  cetirizine (ZYRTEC) 10 MG tablet Take 10  mg by mouth daily.    Historical Provider, MD  clonazePAM (KLONOPIN) 1 MG tablet Take 1 mg by mouth 2 (two) times daily. 02/04/15   Historical Provider, MD  estradiol-norethindrone (ACTIVELLA) 1-0.5 MG tablet Take 1 tablet by mouth daily.    Historical Provider, MD  gabapentin (NEURONTIN) 300 MG capsule Take 300 mg by mouth 2 (two) times daily.     Historical Provider, MD  glucose blood (ACCU-CHEK AVIVA) test strip 1 each by Other route daily. and Lancets 3/day 03/02/15   Renato Shin, MD  HYDROcodone-acetaminophen (NORCO/VICODIN) 5-325 MG tablet Take 2 tablets by mouth every 6 (six) hours as needed. 04/16/16   Bjorn Pippin, PA-C  linagliptin (TRADJENTA) 5 MG TABS tablet 1/2 tab daily 10/28/15   Renato Shin,  MD  metFORMIN (GLUCOPHAGE) 1000 MG tablet Take 2,000 mg by mouth 2 (two) times daily with a meal.     Historical Provider, MD  metoprolol tartrate (LOPRESSOR) 25 MG tablet Take 0.5 tablets (12.5 mg total) by mouth every morning. 06/18/15   Jettie Booze, MD  pantoprazole (PROTONIX) 40 MG tablet Take 40 mg by mouth 2 (two) times daily.     Historical Provider, MD  predniSONE (STERAPRED UNI-PAK 48 TAB) 5 MG (48) TBPK tablet Take 1 tablet (5 mg total) by mouth daily. 04/16/16   Bjorn Pippin, PA-C  rOPINIRole (REQUIP) 2 MG tablet Take 2 mg by mouth 2 (two) times daily. 03/16/15   Historical Provider, MD  valACYclovir (VALTREX) 1000 MG tablet Take 500 mg by mouth daily.     Historical Provider, MD  valsartan-hydrochlorothiazide (DIOVAN-HCT) 160-12.5 MG per tablet Take 0.5 tablets by mouth 3 (three) times a week. MWF    Historical Provider, MD  vitamin C (ASCORBIC ACID) 500 MG tablet Take 500 mg by mouth daily.    Historical Provider, MD  VITAMIN D, ERGOCALCIFEROL, PO Take 1 capsule by mouth daily. 2000 units daily.    Historical Provider, MD  vortioxetine HBr (TRINTELLIX) 20 MG TABS Take 20 mg by mouth daily.    Historical Provider, MD  zolpidem (AMBIEN) 5 MG tablet Take 5 mg by mouth at bedtime.     Historical Provider, MD   Meds Ordered and Administered this Visit  Medications - No data to display  BP 132/83 (BP Location: Right Arm)   Pulse 75   Temp 98.5 F (36.9 C) (Oral)   Resp 18   SpO2 100%  No data found.   Physical Exam  Constitutional: She is oriented to person, place, and time. She appears well-developed and well-nourished. No distress.  In wheelchair, can stand on right leg with minimal weight bear on the left  Musculoskeletal: She exhibits no edema, tenderness or deformity.  Left hip without swelling or tenderness to palpation. No buttock pain. Negative SLR. Pain with external rotation or weight bear  Neurological: She is alert and oriented to person, place, and time.   Skin: Skin is warm and dry. She is not diaphoretic.  Psychiatric: Her behavior is normal.  Nursing note and vitals reviewed.   Urgent Care Course   Clinical Course     Procedures (including critical care time)  Labs Review Labs Reviewed - No data to display  Imaging Review Dg Hip Unilat W Or Wo Pelvis 2-3 Views Left  Result Date: 04/16/2016 CLINICAL DATA:  LEFT hip pain beginning last night with walking or pivoting, prior LEFT hip replacement 2015 EXAM: DG HIP (WITH OR WITHOUT PELVIS) 2-3V LEFT COMPARISON:  10/02/2013 FINDINGS: Osseous  demineralization. BILATERAL hip replacements, new hind LEFT since prior exam. SI joints and gastric. Bulky spur versus heterotopic calcification adjacent to LEFT hip joint. No acute fracture, dislocation, or bone destruction. Minimal increased uptake is seen at the greater trochanter. IMPRESSION: BILATERAL hip replacements and osseous demineralization. No definite acute bony abnormalities. Question spur versus heterotopic calcification adjacent to LEFT hip joint. Electronically Signed   By: Lavonia Dana M.D.   On: 04/16/2016 20:35     Visual Acuity Review  Right Eye Distance:   Left Eye Distance:   Bilateral Distance:    Right Eye Near:   Left Eye Near:    Bilateral Near:         MDM   1. Left leg pain    At this time no acute findings by xray. Doubt spinal in etiology. X rays show new large bone spur new from 2015 and there is a possibility this could be a contributing factor though suggest f/u with Dr. Lyla Glassing for further discussion. In the interim will treat with Prednisone pack for inflammation and Norco for pain. Weight bear as tolerated. FU here as needed. Stable for discharge.     Bjorn Pippin, PA-C 04/16/16 2101

## 2016-04-16 NOTE — Discharge Instructions (Signed)
No issues with prosthesis itself, you have a bone spur as we discussed and will let your orthopedic help to determine if this is contributing to your pain. In the interim will treat with inflammation with a prednisone pack and norco for pain only. F/U with him when possible.

## 2016-04-16 NOTE — ED Triage Notes (Signed)
The patient presented to the [facility] with a complaint of left leg pain that started last night. The patient denied any known injury.

## 2016-04-18 MED FILL — predniSONE 5 MG TABS: 5 | 12 days supply | Qty: 48 | Fill #0

## 2016-04-18 MED FILL — HYDROCODON-APAP 5-325: 5-325 | 3 days supply | Qty: 20 | Fill #0

## 2016-04-19 DIAGNOSIS — M7602 Gluteal tendinitis, left hip: Secondary | ICD-10-CM | POA: Diagnosis not present

## 2016-04-19 DIAGNOSIS — M25562 Pain in left knee: Secondary | ICD-10-CM | POA: Diagnosis not present

## 2016-04-22 DIAGNOSIS — M1812 Unilateral primary osteoarthritis of first carpometacarpal joint, left hand: Secondary | ICD-10-CM | POA: Diagnosis not present

## 2016-04-22 DIAGNOSIS — M654 Radial styloid tenosynovitis [de Quervain]: Secondary | ICD-10-CM | POA: Diagnosis not present

## 2016-04-22 MED FILL — VALACYCLOVIR HCL 500 MG TAB: 500 | 90 days supply | Qty: 90 | Fill #2

## 2016-04-22 MED FILL — metFORMIN HCL 1000 MG TABS: 1000 | 90 days supply | Qty: 360 | Fill #0

## 2016-04-22 MED FILL — TRINTELLIX 20 MG TABLET: 20 | 90 days supply | Qty: 90 | Fill #1

## 2016-04-26 MED FILL — GABAPENTIN 300 MG CAPSULE: 300 | 30 days supply | Qty: 60 | Fill #1

## 2016-05-12 MED FILL — CLINDAMYCIN HCL 300 MG CAPS: 300 | 5 days supply | Qty: 10 | Fill #0

## 2016-05-12 MED FILL — clonazePAM 1 MG TABS: 1 | 90 days supply | Qty: 180 | Fill #1

## 2016-05-24 DIAGNOSIS — M1812 Unilateral primary osteoarthritis of first carpometacarpal joint, left hand: Secondary | ICD-10-CM | POA: Diagnosis not present

## 2016-05-24 MED FILL — ARIPiprazole 5 MG TABS: 5 | 90 days supply | Qty: 90 | Fill #0

## 2016-05-24 MED FILL — ZOLPIDEM TARTRATE 5 MG TAB: 5 | 90 days supply | Qty: 90 | Fill #1

## 2016-05-24 MED FILL — AMLODIPINE BESYLATE 5 MG TA: 5 | 90 days supply | Qty: 90 | Fill #0

## 2016-05-24 MED FILL — GABAPENTIN 300 MG CAPSULE: 300 | 90 days supply | Qty: 180 | Fill #0

## 2016-06-01 ENCOUNTER — Ambulatory Visit: Payer: Self-pay | Admitting: Endocrinology

## 2016-06-06 ENCOUNTER — Other Ambulatory Visit: Payer: Self-pay

## 2016-06-06 DIAGNOSIS — R159 Full incontinence of feces: Secondary | ICD-10-CM | POA: Diagnosis not present

## 2016-06-06 MED ORDER — GLUCOSE BLOOD VI STRP: ORAL_STRIP | 2 refills | Status: DC

## 2016-06-06 MED ORDER — ACCU-CHEK GUIDE W/DEVICE KIT: 1.0000 | PACK | Freq: Three times a day (TID) | 2 refills | Status: DC

## 2016-06-06 MED FILL — ACCU-CHEK GUIDE TEST STRIP: 67 days supply | Qty: 200 | Fill #0

## 2016-06-06 MED FILL — ACCU-CHEK FASTCLIX LANCETS: 34 days supply | Qty: 102 | Fill #0

## 2016-06-06 MED FILL — TRADJENTA 5 MG TABLET: 5 | 60 days supply | Qty: 30 | Fill #3

## 2016-06-06 MED FILL — ATORVASTATIN 10 MG TABLET: 10 | 90 days supply | Qty: 90 | Fill #0

## 2016-06-10 ENCOUNTER — Ambulatory Visit (INDEPENDENT_AMBULATORY_CARE_PROVIDER_SITE_OTHER): Payer: 59 | Admitting: Endocrinology

## 2016-06-10 ENCOUNTER — Encounter: Payer: Self-pay | Admitting: Endocrinology

## 2016-06-10 VITALS — BP 138/86 | HR 78 | Ht 66.0 in | Wt 213.0 lb

## 2016-06-10 DIAGNOSIS — N183 Chronic kidney disease, stage 3 unspecified: Secondary | ICD-10-CM

## 2016-06-10 DIAGNOSIS — E1122 Type 2 diabetes mellitus with diabetic chronic kidney disease: Secondary | ICD-10-CM

## 2016-06-10 LAB — POCT GLYCOSYLATED HEMOGLOBIN (HGB A1C): Hemoglobin A1C: 6

## 2016-06-10 LAB — GLUCOSE, POCT (MANUAL RESULT ENTRY): POC Glucose: 92 mg/dl (ref 70–99)

## 2016-06-10 MED ORDER — METFORMIN HCL ER 500 MG PO TB24: 1000.0000 mg | ORAL_TABLET | Freq: Every day | ORAL | 3 refills | Status: DC

## 2016-06-10 MED FILL — METFORMIN HCL ER 500 MG TAB: 500 | 90 days supply | Qty: 180 | Fill #0

## 2016-06-10 NOTE — Patient Instructions (Addendum)
check your blood sugar once a day.  vary the time of day when you check, between before the 3 meals, and at bedtime.  also check if you have symptoms of your blood sugar being too high or too low.  please keep a record of the readings and bring it to your next appointment here.  You can write it on any piece of paper.  please call us sooner if your blood sugar goes below 70, or if you have a lot of readings over 200.   I have sent a prescription to your pharmacy, to reduce the metformin. Please continue the same tradjena.  Please call if the nausea persists.  Please come back for a follow-up appointment in 6 months.

## 2016-06-10 NOTE — Progress Notes (Signed)
Subjective:    Patient ID: Deanna Rowe, female    DOB: February 23, 1956, 61 y.o.   MRN: [id number]  HPI Pt returns for f/u of diabetes mellitus: DM type: 2 Dx'ed: 5053 Complications: renal insufficiency.  Therapy: 2 oral meds GDM: never DKA: never Severe hypoglycemia: never Pancreatitis: never Other: she has never been on insulin Interval history: pt states cbg's are well-controlled.  She seldom has hypoglycemia, and these episodes are mild. pt states she feels well in general, except for nausea.  Past Medical History:  Diagnosis Date  . Anxiety   . Arthritis    L HIP  . Avascular necrosis of hip (HCC)    LEFT  . Depression   . Diabetes mellitus   . Diabetes mellitus, type II ([place])   . Dry cough   . GERD (gastroesophageal reflux disease)   . Hypertension   . Insomnia    takes Ambien nightly  . Obesity   . PONV (postoperative nausea and vomiting)   . TIA (transient ischemic attack) 2012   no residual problems  . Urgency incontinence     Past Surgical History:  Procedure Laterality Date  . DILATION AND CURETTAGE OF UTERUS    . ESOPHAGEAL MANOMETRY N/A 09/03/2012   Procedure: ESOPHAGEAL MANOMETRY (EM);  Surgeon: Garlan Fair, MD;  Location: [facility];  Service: Endoscopy;  Laterality: N/A;  . EXPLORATORY LAPAROTOMY    . JOINT REPLACEMENT  2011   rt total hip  . PILONIDAL CYST / SINUS EXCISION    . 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 Marital status: Married    Spouse name: N/A  . Number of children: N/A  . Years of education: N/A   Occupational History  . Not on file.   Social History Main Topics  . Smoking status: Never Smoker  . Smokeless tobacco: Never Used  . Alcohol use No  . Drug use: No  . Sexual activity: Not Currently   Other Topics Concern  . Not  on file   Social History Narrative  . No narrative on file    Current Outpatient Prescriptions on File Prior to Visit  Medication Sig Dispense Refill  . albuterol (PROVENTIL HFA;VENTOLIN HFA) 108 (90 BASE) MCG/ACT inhaler Inhale 2 puffs into the lungs as needed for wheezing or shortness of breath (every 4 hours as needed for cough, wheezing and chest tightness).    Marland Kitchen amLODipine (NORVASC) 5 MG tablet Take 5 mg by mouth every morning.     . ARIPiprazole (ABILIFY) 5 MG tablet Take 5 mg by mouth every evening.     Marland Kitchen aspirin EC 81 MG tablet Take 81 mg by mouth every evening.    Marland Kitchen atorvastatin (LIPITOR) 10 MG tablet Take 10 mg by mouth daily.  2  . Blood Glucose Monitoring Suppl (ACCU-CHEK GUIDE) w/Device KIT 1 each by Does not apply route 3 (three) times daily. 1 kit 2  . buPROPion (WELLBUTRIN XL) 150 MG 24 hr tablet Take 450 mg by mouth every morning.     Marland Kitchen CALCIUM-VITAMIN D PO Take 1 tablet by mouth daily.    . cetirizine (ZYRTEC) 10 MG tablet Take 10 mg by mouth daily.    . clonazePAM (KLONOPIN) 1 MG tablet Take 1 mg by mouth 2 (two) times daily.  1  .  estradiol-norethindrone (ACTIVELLA) 1-0.5 MG tablet Take 1 tablet by mouth daily.    Marland Kitchen gabapentin (NEURONTIN) 300 MG capsule Take 300 mg by mouth 2 (two) times daily.     Marland Kitchen glucose blood (ACCU-CHEK GUIDE) test strip Use to check blood sugar 3 times per day. 200 each 2  . linagliptin (TRADJENTA) 5 MG TABS tablet 1/2 tab daily 30 tablet 5  . metoprolol tartrate (LOPRESSOR) 25 MG tablet Take 0.5 tablets (12.5 mg total) by mouth every morning. 90 tablet 3  . pantoprazole (PROTONIX) 40 MG tablet Take 40 mg by mouth 2 (two) times daily.     Marland Kitchen rOPINIRole (REQUIP) 2 MG tablet Take 2 mg by mouth 2 (two) times daily.  3  . valACYclovir (VALTREX) 1000 MG tablet Take 500 mg by mouth daily.     . valsartan-hydrochlorothiazide (DIOVAN-HCT) 160-12.5 MG per tablet Take 0.5 tablets by mouth 3 (three) times a week. MWF    . vitamin C (ASCORBIC ACID) 500 MG  tablet Take 500 mg by mouth daily.    Marland Kitchen VITAMIN D, ERGOCALCIFEROL, PO Take 1 capsule by mouth daily. 2000 units daily.    Marland Kitchen vortioxetine HBr (TRINTELLIX) 20 MG TABS Take 20 mg by mouth daily.    Marland Kitchen zolpidem (AMBIEN) 5 MG tablet Take 5 mg by mouth at bedtime.      Current Facility-Administered Medications on File Prior to Visit  Medication Dose Route Frequency Provider Last Rate Last Dose  . tranexamic acid (CYKLOKAPRON) topical -INTRAOP  2,000 mg Topical Once [employer], PA-C        Allergies  Allergen Reactions  . Codeine Nausea And Vomiting  . Fetzima [Levomilnacipran] Nausea And Vomiting  . Trulicity [Dulaglutide] Nausea And Vomiting    Significant and undesirably rapid (40 lbs) weight loss   . Xanax [Alprazolam] Other (See Comments)    disoriented  . Amoxicillin Rash    Family History  Problem Relation Age of Onset  . Alcohol abuse Brother   . Alcohol abuse Brother   . Alcohol abuse Brother   . Alcohol abuse Brother   . Diabetes Neg Hx    BP 138/86   Pulse 78   Ht _0  (1.676 m)   Wt 213 lb (96.6 kg)   SpO2 98%   BMI 34.38 kg/m   Review of Systems Denies LOC    Objective:   Physical Exam VITAL SIGNS:  See vs page GENERAL: no distress Pulses: dorsalis pedis intact bilat.   MSK: no deformity of the feet CV: no leg edema. Skin:  no ulcer on the feet.  normal color and temp on the feet. Neuro: sensation is intact to touch on the feet.   Lab Results  Component Value Date   HGBA1C 5.9 02/12/2016      Assessment & Plan:  Hypoglycemia, mild, possibly due to meds.  Nausea, possibly due to metformin.  Type 2 DM: well-controlled.  At this a1c, glycemic control can tolerate med reduction.   Patient is advised the following: Patient Instructions  check your blood sugar once a day.  vary the time of day when you check, between before the 3 meals, and at bedtime.  also check if you have symptoms of your blood sugar being too high or too low.  please keep a  record of the readings and bring it to your next appointment here.  You can write it on any piece of paper.  please call us sooner if your blood sugar goes below 70, or if  you have a lot of readings over 200.   I have sent a prescription to your pharmacy, to reduce the metformin. Please continue the same tradjena.  Please call if the nausea persists.  Please come back for a follow-up appointment in 6 months.

## 2016-06-13 DIAGNOSIS — F330 Major depressive disorder, recurrent, mild: Secondary | ICD-10-CM | POA: Diagnosis not present

## 2016-06-13 DIAGNOSIS — F411 Generalized anxiety disorder: Secondary | ICD-10-CM | POA: Diagnosis not present

## 2016-06-15 MED FILL — rOPINIRole HCL 2 MG TABS: 2 | 90 days supply | Qty: 180 | Fill #1

## 2016-06-15 MED FILL — VALSARTAN-HCTZ 160-12.5 MG: 160-12.5 | 89 days supply | Qty: 20 | Fill #3

## 2016-06-15 MED FILL — buPROPion HCL ER (XL) 150 M: 150 | 90 days supply | Qty: 270 | Fill #0

## 2016-06-22 DIAGNOSIS — F251 Schizoaffective disorder, depressive type: Secondary | ICD-10-CM | POA: Diagnosis not present

## 2016-06-22 DIAGNOSIS — F411 Generalized anxiety disorder: Secondary | ICD-10-CM | POA: Diagnosis not present

## 2016-06-22 DIAGNOSIS — F330 Major depressive disorder, recurrent, mild: Secondary | ICD-10-CM | POA: Diagnosis not present

## 2016-06-23 DIAGNOSIS — E7800 Pure hypercholesterolemia, unspecified: Secondary | ICD-10-CM | POA: Diagnosis not present

## 2016-06-23 DIAGNOSIS — E559 Vitamin D deficiency, unspecified: Secondary | ICD-10-CM | POA: Diagnosis not present

## 2016-06-23 DIAGNOSIS — E119 Type 2 diabetes mellitus without complications: Secondary | ICD-10-CM | POA: Diagnosis not present

## 2016-06-23 DIAGNOSIS — I10 Essential (primary) hypertension: Secondary | ICD-10-CM | POA: Diagnosis not present

## 2016-06-23 MED FILL — FUROSEMIDE 20 MG TABLET: 20 | 20 days supply | Qty: 20 | Fill #0

## 2016-06-24 ENCOUNTER — Emergency Department (HOSPITAL_BASED_OUTPATIENT_CLINIC_OR_DEPARTMENT_OTHER): Payer: 59

## 2016-06-24 ENCOUNTER — Emergency Department (HOSPITAL_BASED_OUTPATIENT_CLINIC_OR_DEPARTMENT_OTHER)
Admission: EM | Admit: 2016-06-24 | Discharge: 2016-06-24 | Disposition: A | Discharge status: Home / Self Care | Payer: 59 | Attending: Emergency Medicine | Admitting: Emergency Medicine

## 2016-06-24 ENCOUNTER — Encounter (HOSPITAL_BASED_OUTPATIENT_CLINIC_OR_DEPARTMENT_OTHER): Payer: Self-pay

## 2016-06-24 DIAGNOSIS — Y999 Unspecified external cause status: Secondary | ICD-10-CM | POA: Insufficient documentation

## 2016-06-24 DIAGNOSIS — Y939 Activity, unspecified: Secondary | ICD-10-CM | POA: Insufficient documentation

## 2016-06-24 DIAGNOSIS — M25552 Pain in left hip: Secondary | ICD-10-CM | POA: Insufficient documentation

## 2016-06-24 DIAGNOSIS — Z7984 Long term (current) use of oral hypoglycemic drugs: Secondary | ICD-10-CM | POA: Insufficient documentation

## 2016-06-24 DIAGNOSIS — E119 Type 2 diabetes mellitus without complications: Secondary | ICD-10-CM | POA: Diagnosis not present

## 2016-06-24 DIAGNOSIS — Y92410 Unspecified street and highway as the place of occurrence of the external cause: Secondary | ICD-10-CM | POA: Insufficient documentation

## 2016-06-24 DIAGNOSIS — S79912A Unspecified injury of left hip, initial encounter: Secondary | ICD-10-CM | POA: Diagnosis not present

## 2016-06-24 DIAGNOSIS — Z7982 Long term (current) use of aspirin: Secondary | ICD-10-CM | POA: Diagnosis not present

## 2016-06-24 DIAGNOSIS — M546 Pain in thoracic spine: Secondary | ICD-10-CM | POA: Diagnosis not present

## 2016-06-24 DIAGNOSIS — M545 Low back pain: Secondary | ICD-10-CM | POA: Diagnosis not present

## 2016-06-24 DIAGNOSIS — I10 Essential (primary) hypertension: Secondary | ICD-10-CM | POA: Diagnosis not present

## 2016-06-24 DIAGNOSIS — S299XXA Unspecified injury of thorax, initial encounter: Secondary | ICD-10-CM | POA: Diagnosis present

## 2016-06-24 DIAGNOSIS — Z79899 Other long term (current) drug therapy: Secondary | ICD-10-CM | POA: Diagnosis not present

## 2016-06-24 DIAGNOSIS — M549 Dorsalgia, unspecified: Secondary | ICD-10-CM | POA: Diagnosis not present

## 2016-06-24 DIAGNOSIS — M5489 Other dorsalgia: Secondary | ICD-10-CM | POA: Diagnosis not present

## 2016-06-24 HISTORY — DX: Disorder of kidney and ureter, unspecified: N28.9

## 2016-06-24 MED ORDER — IBUPROFEN 600 MG PO TABS: 600.0000 mg | ORAL_TABLET | Freq: Four times a day (QID) | ORAL | 0 refills | Status: DC | PRN

## 2016-06-24 MED ORDER — LIDOCAINE 5 % EX PTCH: 1.0000 | MEDICATED_PATCH | CUTANEOUS | 0 refills | Status: DC

## 2016-06-24 MED ORDER — METHOCARBAMOL 500 MG PO TABS: 500.0000 mg | ORAL_TABLET | Freq: Two times a day (BID) | ORAL | 0 refills | Status: DC

## 2016-06-24 NOTE — ED Notes (Signed)
Pt teaching provided on medications that may cause drowsiness. Pt instructed not to drive or operate heavy machinery while taking the prescribed medication. Pt verbalized understanding.   

## 2016-06-24 NOTE — Discharge Instructions (Signed)
There were no acute abnormalities on the xrays of the pelvis, left hip, or of the thoracic or lumbar spine.   Expect your soreness to increase over the next 2-3 days. Take it easy, but do not lay around too much as this may make any stiffness worse.  Antiinflammatory medications: Take 500 mg of naproxen every 12 hours or 600 mg of ibuprofen every 6 hours for the next 3 days. Take these medications with food to avoid upset stomach. Choose only one of these medications, do not take them together.  Muscle relaxer: Robaxin is a muscle relaxer and may help loosen stiff muscles. Do not take the Robaxin while driving or performing other dangerous activities.   Lidocaine patches: These are available via either prescription or over-the-counter. The over-the-counter option may be more economical one and are likely just as effective. There are multiple over-the-counter brands, such as Salonpas.  Exercises: Be sure to perform the attached exercises starting with three times a week and working up to performing them daily. This is an essential part of preventing long term problems.   Follow up with a primary care provider for any future management of these complaints.
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
## 2016-06-28 DIAGNOSIS — F251 Schizoaffective disorder, depressive type: Secondary | ICD-10-CM | POA: Diagnosis not present

## 2016-06-28 MED FILL — LORazepam 1 MG TABS: 1 | 90 days supply | Qty: 360 | Fill #0

## 2016-06-30 MED FILL — LIDOCAINE 5% PATCH: 5 | 30 days supply | Qty: 30 | Fill #0

## 2016-07-05 DIAGNOSIS — F251 Schizoaffective disorder, depressive type: Secondary | ICD-10-CM | POA: Diagnosis not present

## 2016-07-05 DIAGNOSIS — F330 Major depressive disorder, recurrent, mild: Secondary | ICD-10-CM | POA: Diagnosis not present

## 2016-07-06 DIAGNOSIS — M7062 Trochanteric bursitis, left hip: Secondary | ICD-10-CM | POA: Diagnosis not present

## 2016-07-11 DIAGNOSIS — E119 Type 2 diabetes mellitus without complications: Secondary | ICD-10-CM | POA: Diagnosis not present

## 2016-07-11 DIAGNOSIS — Z7984 Long term (current) use of oral hypoglycemic drugs: Secondary | ICD-10-CM | POA: Diagnosis not present

## 2016-07-11 DIAGNOSIS — Z862 Personal history of diseases of the blood and blood-forming organs and certain disorders involving the immune mechanism: Secondary | ICD-10-CM | POA: Diagnosis not present

## 2016-07-15 DIAGNOSIS — M7062 Trochanteric bursitis, left hip: Secondary | ICD-10-CM | POA: Diagnosis not present

## 2016-07-19 DIAGNOSIS — F251 Schizoaffective disorder, depressive type: Secondary | ICD-10-CM | POA: Diagnosis not present

## 2016-07-19 DIAGNOSIS — F330 Major depressive disorder, recurrent, mild: Secondary | ICD-10-CM | POA: Diagnosis not present

## 2016-07-21 DIAGNOSIS — D7589 Other specified diseases of blood and blood-forming organs: Secondary | ICD-10-CM | POA: Diagnosis not present

## 2016-07-22 MED FILL — TRINTELLIX 20 MG TABLET: 20 | 90 days supply | Qty: 90 | Fill #0

## 2016-07-22 MED FILL — ACCU-CHEK FASTCLIX LANCETS: 34 days supply | Qty: 102 | Fill #1

## 2016-07-22 MED FILL — PANTOPRAZOLE SOD DR 40 MG T: 40 | 90 days supply | Qty: 180 | Fill #0

## 2016-07-22 MED FILL — VALACYCLOVIR HCL 500 MG TAB: 500 | 90 days supply | Qty: 90 | Fill #3

## 2016-08-03 DIAGNOSIS — G8929 Other chronic pain: Secondary | ICD-10-CM | POA: Diagnosis not present

## 2016-08-03 DIAGNOSIS — M25552 Pain in left hip: Secondary | ICD-10-CM | POA: Diagnosis not present

## 2016-08-09 ENCOUNTER — Other Ambulatory Visit (HOSPITAL_COMMUNITY): Payer: Self-pay | Admitting: Orthopedic Surgery

## 2016-08-09 DIAGNOSIS — M25552 Pain in left hip: Secondary | ICD-10-CM

## 2016-08-10 ENCOUNTER — Encounter: Payer: Self-pay | Admitting: Hematology

## 2016-08-10 ENCOUNTER — Telehealth: Payer: Self-pay | Admitting: Hematology

## 2016-08-10 NOTE — Telephone Encounter (Signed)
Appt has been scheduled for the pt to see Dr. Irene Limbo on 6/11 at 11am. Pt agreed to the appt date and time. Demographics verified. Letter mailed to the pt and faxed to the referring.

## 2016-08-11 ENCOUNTER — Ambulatory Visit: Payer: Self-pay | Admitting: Endocrinology

## 2016-08-16 ENCOUNTER — Encounter (HOSPITAL_COMMUNITY): Payer: 59

## 2016-08-16 ENCOUNTER — Encounter (HOSPITAL_COMMUNITY)
Admission: RE | Admit: 2016-08-16 | Discharge: 2016-08-16 | Disposition: A | Discharge status: Home / Self Care | Payer: 59 | Source: Ambulatory Visit | Attending: Orthopedic Surgery | Admitting: Orthopedic Surgery

## 2016-08-16 DIAGNOSIS — M25552 Pain in left hip: Secondary | ICD-10-CM | POA: Insufficient documentation

## 2016-08-16 DIAGNOSIS — M25559 Pain in unspecified hip: Secondary | ICD-10-CM | POA: Diagnosis not present

## 2016-08-16 MED ORDER — TECHNETIUM TC 99M MEDRONATE IV KIT
25.0000 | PACK | Freq: Once | INTRAVENOUS | Status: AC | PRN
Start: 2016-08-16 — End: 2016-08-16
  Administered 2016-08-16: 25 via INTRAVENOUS

## 2016-08-17 MED FILL — TRADJENTA 5 MG TABLET: 5 | 60 days supply | Qty: 30 | Fill #4

## 2016-08-22 MED FILL — GABAPENTIN 300 MG CAPSULE: 300 | 89 days supply | Qty: 178 | Fill #1

## 2016-08-22 MED FILL — AMLODIPINE BESYLATE 5 MG TA: 5 | 89 days supply | Qty: 89 | Fill #1

## 2016-08-22 MED FILL — ZOLPIDEM TARTRATE 5 MG TAB: 5 | 90 days supply | Qty: 90 | Fill #0

## 2016-08-22 MED FILL — ARIPiprazole 5 MG TABS: 5 | 89 days supply | Qty: 89 | Fill #1

## 2016-08-30 MED FILL — ACCU-CHEK FASTCLIX LANCETS: 34 days supply | Qty: 102 | Fill #2

## 2016-08-30 MED FILL — ACCU-CHEK GUIDE TEST STRIP: 67 days supply | Qty: 200 | Fill #1

## 2016-09-05 DIAGNOSIS — M7062 Trochanteric bursitis, left hip: Secondary | ICD-10-CM | POA: Diagnosis not present

## 2016-09-05 DIAGNOSIS — Z96642 Presence of left artificial hip joint: Secondary | ICD-10-CM | POA: Diagnosis not present

## 2016-09-05 DIAGNOSIS — M25552 Pain in left hip: Secondary | ICD-10-CM | POA: Diagnosis not present

## 2016-09-05 DIAGNOSIS — G8929 Other chronic pain: Secondary | ICD-10-CM | POA: Diagnosis not present

## 2016-09-06 DIAGNOSIS — F251 Schizoaffective disorder, depressive type: Secondary | ICD-10-CM | POA: Diagnosis not present

## 2016-09-06 MED FILL — rOPINIRole HCL 2 MG TABS: 2 | 90 days supply | Qty: 180 | Fill #0

## 2016-09-06 MED FILL — ATORVASTATIN 10 MG TABLET: 10 | 90 days supply | Qty: 90 | Fill #0

## 2016-09-06 MED FILL — BUPROPION XL 150 MG TAB: 150 | 90 days supply | Qty: 270 | Fill #0

## 2016-09-07 MED FILL — METFORMIN HCL ER 500 MG TAB: 500 | 89 days supply | Qty: 178 | Fill #1

## 2016-09-08 DIAGNOSIS — Z96642 Presence of left artificial hip joint: Secondary | ICD-10-CM | POA: Diagnosis not present

## 2016-09-08 DIAGNOSIS — M25552 Pain in left hip: Secondary | ICD-10-CM | POA: Diagnosis not present

## 2016-09-12 ENCOUNTER — Ambulatory Visit (HOSPITAL_BASED_OUTPATIENT_CLINIC_OR_DEPARTMENT_OTHER): Payer: 59 | Admitting: Hematology

## 2016-09-12 ENCOUNTER — Telehealth: Payer: Self-pay | Admitting: Hematology

## 2016-09-12 ENCOUNTER — Ambulatory Visit (HOSPITAL_BASED_OUTPATIENT_CLINIC_OR_DEPARTMENT_OTHER): Payer: 59

## 2016-09-12 ENCOUNTER — Encounter: Payer: Self-pay | Admitting: Hematology

## 2016-09-12 VITALS — BP 149/82 | HR 64 | Temp 98.5°F | Resp 18 | Ht 66.0 in | Wt 217.9 lb

## 2016-09-12 DIAGNOSIS — D649 Anemia, unspecified: Secondary | ICD-10-CM

## 2016-09-12 DIAGNOSIS — D539 Nutritional anemia, unspecified: Secondary | ICD-10-CM

## 2016-09-12 DIAGNOSIS — D7589 Other specified diseases of blood and blood-forming organs: Secondary | ICD-10-CM | POA: Diagnosis not present

## 2016-09-12 DIAGNOSIS — E538 Deficiency of other specified B group vitamins: Secondary | ICD-10-CM | POA: Diagnosis not present

## 2016-09-12 DIAGNOSIS — N189 Chronic kidney disease, unspecified: Secondary | ICD-10-CM

## 2016-09-12 DIAGNOSIS — E1140 Type 2 diabetes mellitus with diabetic neuropathy, unspecified: Secondary | ICD-10-CM | POA: Diagnosis not present

## 2016-09-12 DIAGNOSIS — I10 Essential (primary) hypertension: Secondary | ICD-10-CM

## 2016-09-12 LAB — CBC & DIFF AND RETIC
BASO%: 0.2 % (ref 0.0–2.0)
Basophils Absolute: 0 10*3/uL (ref 0.0–0.1)
EOS%: 0.8 % (ref 0.0–7.0)
Eosinophils Absolute: 0.1 10*3/uL (ref 0.0–0.5)
HCT: 36.6 % (ref 34.8–46.6)
HGB: 11.8 g/dL (ref 11.6–15.9)
Immature Retic Fract: 11.9 % — ABNORMAL HIGH (ref 1.60–10.00)
LYMPH%: 40.7 % (ref 14.0–49.7)
MCH: 32.8 pg (ref 25.1–34.0)
MCHC: 32.2 g/dL (ref 31.5–36.0)
MCV: 101.7 fL — ABNORMAL HIGH (ref 79.5–101.0)
MONO#: 0.7 10*3/uL (ref 0.1–0.9)
MONO%: 10.2 % (ref 0.0–14.0)
NEUT#: 3.1 10*3/uL (ref 1.5–6.5)
NEUT%: 48.1 % (ref 38.4–76.8)
Platelets: 238 10*3/uL (ref 145–400)
RBC: 3.6 10*6/uL — ABNORMAL LOW (ref 3.70–5.45)
RDW: 13.8 % (ref 11.2–14.5)
Retic %: 1.85 % (ref 0.70–2.10)
Retic Ct Abs: 66.6 10*3/uL (ref 33.70–90.70)
WBC: 6.4 10*3/uL (ref 3.9–10.3)
lymph#: 2.6 10*3/uL (ref 0.9–3.3)

## 2016-09-12 LAB — COMPREHENSIVE METABOLIC PANEL
ALT: 13 U/L (ref 0–55)
AST: 18 U/L (ref 5–34)
Albumin: 3.7 g/dL (ref 3.5–5.0)
Alkaline Phosphatase: 41 U/L (ref 40–150)
Anion Gap: 8 mEq/L (ref 3–11)
BUN: 14.9 mg/dL (ref 7.0–26.0)
CO2: 27 mEq/L (ref 22–29)
Calcium: 9.8 mg/dL (ref 8.4–10.4)
Chloride: 107 mEq/L (ref 98–109)
Creatinine: 1.3 mg/dL — ABNORMAL HIGH (ref 0.6–1.1)
EGFR: 51 mL/min/{1.73_m2} — ABNORMAL LOW (ref >90)
Glucose: 68 mg/dl — ABNORMAL LOW (ref 70–140)
Potassium: 4.4 mEq/L (ref 3.5–5.1)
Sodium: 141 mEq/L (ref 136–145)
Total Bilirubin: 0.42 mg/dL (ref 0.20–1.20)
Total Protein: 7.7 g/dL (ref 6.4–8.3)

## 2016-09-12 LAB — CHCC SMEAR

## 2016-09-12 LAB — LACTATE DEHYDROGENASE: LDH: 185 U/L (ref 125–245)

## 2016-09-12 NOTE — Patient Instructions (Addendum)
Patient care instructions  Please take over-the-counter liquid vitamin B-12 1000 g sublingual daily B complex 1 capsule by mouth daily.   Thank you for choosing [facility] to provide your oncology and hematology care.  To afford each patient quality time with our providers, please arrive 30 minutes before your scheduled appointment time.  If you arrive late for your appointment, you may be asked to reschedule.  We strive to give you quality time with our providers, and arriving late affects you and other patients whose appointments are after yours.  If you are a no show for multiple scheduled visits, you may be dismissed from the clinic at the providers discretion.   Again, thank you for choosing [facility], our hope is that these requests will decrease the amount of time that you wait before being seen by our physicians.  ______________________________________________________________________ Should you have questions after your visit to the [facility], please contact our office at [phone] between the hours of 8:30 and 4:30 p.m.    Voicemails left after 4:30p.m will not be returned until the following business day.   For prescription refill requests, please have your pharmacy contact us directly.  Please also try to allow 48 hours for prescription requests.   Please contact the scheduling department for questions regarding scheduling.  For scheduling of procedures such as PET scans, CT scans, MRI, Ultrasound, etc please contact central scheduling at [phone].   Resources For Cancer Patients and Caregivers:  American Cancer Society:  603-622-9634  Can help patients locate various types of support and financial assistance Cancer Care: 1-800-813-HOPE 316-593-4765) Provides financial assistance, online support groups, medication/co-pay assistance.   [place]:  646-128-4965 Where to apply for food stamps, Medicaid, and utility  assistance Medicare Rights Center: (214) 258-5088 Helps people with Medicare understand their rights and benefits, navigate the Medicare system, and secure the quality healthcare they deserve SCAT: [place] [employer]'s shared-ride transportation service for eligible riders who have a disability that prevents them from riding the fixed route bus.   For additional information on assistance programs please contact our social worker:   [place]:  [phone]

## 2016-09-12 NOTE — Telephone Encounter (Signed)
Scheduled appt per 6/11 los - Gave patient AVS and calender per 6/11 los - lab and f/u in 4 months.

## 2016-09-12 NOTE — Progress Notes (Signed)
Marland Kitchen    HEMATOLOGY/ONCOLOGY CONSULTATION NOTE  Date of Service: 09/12/2016  Patient Care Team: Leighton Ruff, MD as PCP - General (Family Medicine) Clementeen Graham, [facility] as [place] Management (Pharmacist)  CHIEF COMPLAINTS/PURPOSE OF CONSULTATION:  Abnormal CBC  HISTORY OF PRESENTING ILLNESS:   Deanna Rowe is a wonderful 61 y.o. female who has been referred to [name] by Dr .Leighton Ruff, MD  for evaluation and management of "Abnormal CBC".  Patient has a history of hypertension, diabetes with neuropathy, chronic kidney disease, TIA, restless leg syndrome, allergic rhinitis, tinnitus, cough variant asthma, achalasia was noted on recent labs to have RBC macrocytosis with mild anemia on routine labs by her primary care physician.  Labs done on 07/21/2016 showed a hemoglobin of 11.5 with MCV of 101.1 normal WBC count of 7.4k and normal platelet count of 213k. Based on review of labs sent the patient has had borderline macrocytosis since at least April 2016 with mild anemia.  Recent labs showed a B12 level that was low normal at 280 and normal folate level of 18.5. Patient has been on chronic metformin for several years which in addition to Abilify can certainly cause some macrocytosis.  Her ongoing metformin use as well as PPI use also puts her at high risk for B12 deficiency. Patient hasn't had any significant anemia from this. No focal bone pains.  MEDICAL HISTORY:  Past Medical History:  Diagnosis Date  . Anxiety   . Arthritis    L HIP  . Avascular necrosis of hip (HCC)    LEFT  . Depression   . Diabetes mellitus   . Diabetes mellitus, type II ([place])   . Dry cough   . GERD (gastroesophageal reflux disease)   . Hypertension   . Insomnia    takes Ambien nightly  . Kidney disease   . Obesity   . PONV (postoperative nausea and vomiting)   . TIA (transient ischemic attack) 2012   no residual problems  . Urgency incontinence     SURGICAL  HISTORY: Past Surgical History:  Procedure Laterality Date  . DILATION AND CURETTAGE OF UTERUS    . ESOPHAGEAL MANOMETRY N/A 09/03/2012   Procedure: ESOPHAGEAL MANOMETRY (EM);  Surgeon: Garlan Fair, MD;  Location: [facility];  Service: Endoscopy;  Laterality: N/A;  . EXPLORATORY LAPAROTOMY    . JOINT REPLACEMENT  2011   rt total hip  . PILONIDAL CYST / SINUS EXCISION    . TOTAL HIP ARTHROPLASTY Left 10/09/2013   Procedure: LEFT TOTAL HIP ARTHROPLASTY ANTERIOR APPROACH;  Surgeon: [name], MD;  Location: [facility];  Service: Orthopedics;  Laterality: Left;    SOCIAL HISTORY: Social History   Social History  . Marital status: Married    Spouse name: N/A  . Number of children: N/A  . Years of education: N/A   Occupational History  . Not on file.   Social History Main Topics  . Smoking status: Never Smoker  . Smokeless tobacco: Never Used  . Alcohol use No  . Drug use: No  . Sexual activity: Not Currently   Other Topics Concern  . Not on file   Social History Narrative  . No narrative on file    FAMILY HISTORY: Family History  Problem Relation Age of Onset  . Alcohol abuse Brother   . Alcohol abuse Brother   . Alcohol abuse Brother   . Alcohol abuse Brother   . Diabetes Neg Hx     ALLERGIES:  is allergic to codeine; fetzima [levomilnacipran]; trulicity [dulaglutide]; xanax [alprazolam]; and amoxicillin.  MEDICATIONS:  Current Outpatient Prescriptions  Medication Sig Dispense Refill  . furosemide (LASIX) 20 MG tablet Take 20 mg by mouth daily.    Marland Kitchen LORazepam (ATIVAN) 1 MG tablet Take 1 mg by mouth 4 (four) times daily.    Marland Kitchen albuterol (PROVENTIL HFA;VENTOLIN HFA) 108 (90 BASE) MCG/ACT inhaler Inhale 2 puffs into the lungs as needed for wheezing or shortness of breath (every 4 hours as needed for cough, wheezing and chest tightness).    Marland Kitchen amLODipine (NORVASC) 5 MG tablet Take 5 mg by mouth every morning.     . ARIPiprazole (ABILIFY) 5 MG tablet Take 5 mg by  mouth every evening.     Marland Kitchen aspirin EC 81 MG tablet Take 81 mg by mouth every evening.    Marland Kitchen atorvastatin (LIPITOR) 10 MG tablet Take 10 mg by mouth daily.  2  . Blood Glucose Monitoring Suppl (ACCU-CHEK GUIDE) w/Device KIT 1 each by Does not apply route 3 (three) times daily. 1 kit 2  . buPROPion (WELLBUTRIN XL) 150 MG 24 hr tablet Take 450 mg by mouth every morning.     Marland Kitchen CALCIUM-VITAMIN D PO Take 1 tablet by mouth daily.    . cetirizine (ZYRTEC) 10 MG tablet Take 10 mg by mouth daily.    Marland Kitchen gabapentin (NEURONTIN) 300 MG capsule Take 300 mg by mouth 2 (two) times daily.     Marland Kitchen glucose blood (ACCU-CHEK GUIDE) test strip Use to check blood sugar 3 times per day. 200 each 2  . linagliptin (TRADJENTA) 5 MG TABS tablet 1/2 tab daily 30 tablet 5  . metFORMIN (GLUCOPHAGE-XR) 500 MG 24 hr tablet Take 2 tablets (1,000 mg total) by mouth daily with breakfast. 180 tablet 3  . metoprolol tartrate (LOPRESSOR) 25 MG tablet Take 0.5 tablets (12.5 mg total) by mouth every morning. 90 tablet 3  . pantoprazole (PROTONIX) 40 MG tablet Take 40 mg by mouth 2 (two) times daily.     Marland Kitchen rOPINIRole (REQUIP) 2 MG tablet Take 2 mg by mouth 2 (two) times daily.  3  . valACYclovir (VALTREX) 1000 MG tablet Take 500 mg by mouth daily.     . valsartan-hydrochlorothiazide (DIOVAN-HCT) 160-12.5 MG per tablet Take 0.5 tablets by mouth 3 (three) times a week. MWF    . vitamin C (ASCORBIC ACID) 500 MG tablet Take 500 mg by mouth daily.    Marland Kitchen VITAMIN D, ERGOCALCIFEROL, PO Take 1 capsule by mouth daily. 2000 units daily.    Marland Kitchen vortioxetine HBr (TRINTELLIX) 20 MG TABS Take 20 mg by mouth daily.    Marland Kitchen zolpidem (AMBIEN) 5 MG tablet Take 5 mg by mouth at bedtime.      No current facility-administered medications for this visit.    Facility-Administered Medications Ordered in Other Visits  Medication Dose Route Frequency Provider Last Rate Last Dose  . tranexamic acid (CYKLOKAPRON) topical -INTRAOP  2,000 mg Topical Once Cecilio Asper, [employer],  PA-C        REVIEW OF SYSTEMS:    10 Point review of Systems was done is negative except as noted above.  PHYSICAL EXAMINATION: ECOG PERFORMANCE STATUS: 1 - Symptomatic but completely ambulatory  . Vitals:   09/12/16 1119  BP: (!) 149/82  Pulse: 64  Resp: 18  Temp: 98.5 F (36.9 C)   Filed Weights   09/12/16 1119  Weight: 217 lb 14.4 oz (98.8 kg)   .Body mass index is 35.17 kg/m.  GENERAL:alert, in no acute distress and comfortable SKIN: no acute rashes, no significant lesions EYES: conjunctiva are pink and non-injected, sclera anicteric OROPHARYNX: MMM, no exudates, no oropharyngeal erythema or ulceration NECK: supple, no JVD LYMPH:  no palpable lymphadenopathy in the cervical, axillary or inguinal regions LUNGS: clear to auscultation b/l with normal respiratory effort HEART: regular rate & rhythm ABDOMEN:  normoactive bowel sounds , non tender, not distended. No palpable hepatosplenomegaly. Extremity: no pedal edema PSYCH: alert & oriented x 3 with fluent speech NEURO: no focal motor/sensory deficits  LABORATORY DATA:  I have reviewed the data as listed  . CBC Latest Ref Rng & Units 09/12/2016 09/12/2016 04/16/2015  WBC 3.9 - 10.3 10e3/uL 6.4 - 6.6  Hemoglobin 11.6 - 15.9 g/dL 11.8 - 13.1  Hematocrit 34.0 - 46.6 % 34.6 36.6 39.0  Platelets 145 - 400 10e3/uL 238 - 291    . CMP Latest Ref Rng & Units 09/12/2016 04/16/2015 08/13/2014  Glucose 70 - 140 mg/dl 68(L) 80 90  BUN 7.0 - 26.0 mg/dL 14.9 15 11   Creatinine 0.6 - 1.1 mg/dL 1.3(H) 1.21(H) 1.05(H)  Sodium 136 - 145 mEq/L 141 140 139  Potassium 3.5 - 5.1 mEq/L 4.4 4.9 3.8  Chloride 98 - 110 mmol/L - 102 103  CO2 22 - 29 mEq/L 27 27 26   Calcium 8.4 - 10.4 mg/dL 9.8 10.3 9.6  Total Protein 6.4 - 8.3 g/dL 7.7 7.6 8.2(H)  Total Bilirubin 0.20 - 1.20 mg/dL 0.42 0.4 0.4  Alkaline Phos 40 - 150 U/L 41 45 64  AST 5 - 34 U/L 18 17 21   ALT 0 - 55 U/L 13 13 18    Component     Latest Ref Rng & Units 09/12/2016    Folate, Hemolysate     Not Estab. ng/mL 388.8  HCT     34.0 - 46.6 % 34.6  Folate, RBC     >498 ng/mL 1,124  LDH     125 - 245 U/L 185  Vitamin B12     232 - 1,245 pg/mL 358     RADIOGRAPHIC STUDIES: I have personally reviewed the radiological images as listed and agreed with the findings in the report.  ASSESSMENT & PLAN:   61 year old female with multiple medical comorbidities as noted above with  #1 RBC macrocytosis with mild anemia.  The macrocytosis would certainly be from chronic metformin use and the use of Abilify - both of which can cause some macrocytosis. Patient had borderline low B12 levels of 280 and normal folate levels. Chronic use of metformin and PPI can be risk factors for B12 deficiency and some other deficiencies.  Normal LDH suggests no evidence of hemolysis. The likelihood of MDS of the patient's age and in the absence of significant progressive anemia or other CBC abnormalities. Plan -I had a detailed discussion with the patient about the likely cause of her RBC macrocytosis. -We discussed that RBC macrocytosis itself would not be a reason for changing her medications. -It would be reasonable to aggressively replace the patient's B12 levels to keep the levels about 500 especially with concurrent diabetic neuropathy. -Advised the patient that it would be reasonable for her to take an over-the-counter B complex multivitamin daily. -No indication for additional workup at this time  #2  Patient Active Problem List   Diagnosis Date Noted  . UTI (urinary tract infection) 12/01/2015  . Diabetes ([place]) 06/03/2015  . 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 2 diabetes mellitus ([place]) 04/24/2014  . Postoperative anemia due to  acute blood loss 10/10/2013  . Avascular necrosis of bone of left hip ([place]) 10/09/2013  . OA (osteoarthritis) of hip 10/09/2013  . Cough 07/31/2013  . Shortness of breath 06/18/2013  . Anxiety   . Esophageal dysphagia 09/12/2012  . Depression  02/21/2012  . Stroke [facility]) 02/11/2012  -Continue follow-up with primary care physician for management of other medical comorbidities.  RTC with Dr Irene Limbo in 4 months with labs  All of the patients questions were answered with apparent satisfaction. The patient knows to call the clinic with any problems, questions or concerns.  I spent 40 minutes counseling the patient face to face. The total time spent in the appointment was 50 minutes and more than 50% was on counseling and direct patient cares.    Sullivan Lone MD [place] AAHIVMS [facility] Hematology/Oncology Physician [facility]  (Office):       [phone] (Work cell):  [phone] (Fax):           [phone]  09/12/2016 11:28 AM

## 2016-09-13 DIAGNOSIS — G8929 Other chronic pain: Secondary | ICD-10-CM | POA: Diagnosis not present

## 2016-09-13 DIAGNOSIS — M7062 Trochanteric bursitis, left hip: Secondary | ICD-10-CM | POA: Diagnosis not present

## 2016-09-13 DIAGNOSIS — Z96642 Presence of left artificial hip joint: Secondary | ICD-10-CM | POA: Diagnosis not present

## 2016-09-13 DIAGNOSIS — M25552 Pain in left hip: Secondary | ICD-10-CM | POA: Diagnosis not present

## 2016-09-13 LAB — FOLATE RBC
Folate, Hemolysate: 388.8 ng/mL
Folate, RBC: 1124 ng/mL (ref >498)
Hematocrit: 34.6 % (ref 34.0–46.6)

## 2016-09-13 LAB — VITAMIN B12: Vitamin B12: 358 pg/mL (ref 232–1245)

## 2016-09-16 ENCOUNTER — Other Ambulatory Visit: Payer: Self-pay | Admitting: Cardiology

## 2016-09-16 MED FILL — VALSARTAN-HCTZ 160-12.5 MG: 160-12.5 | 60 days supply | Qty: 90 | Fill #0

## 2016-09-16 MED FILL — METOPROLOL TARTRATE 25 MG T: 25 | 81 days supply | Qty: 81 | Fill #0

## 2016-09-17 DIAGNOSIS — J069 Acute upper respiratory infection, unspecified: Secondary | ICD-10-CM | POA: Diagnosis not present

## 2016-09-17 DIAGNOSIS — J45901 Unspecified asthma with (acute) exacerbation: Secondary | ICD-10-CM | POA: Diagnosis not present

## 2016-09-19 MED FILL — VENTOLIN HFA 90 MCG INHALER: 108 (90 BAS | 25 days supply | Qty: 18 | Fill #0

## 2016-09-19 MED FILL — predniSONE 20 MG TABS: 20 | 5 days supply | Qty: 10 | Fill #0

## 2016-10-06 ENCOUNTER — Telehealth: Payer: Self-pay | Admitting: Endocrinology

## 2016-10-06 NOTE — Telephone Encounter (Signed)
I contacted the patient. She stated for about 2 week she has been having sharp pain on the right side. She stated it has gotten to the point where she cannot lay on her side in the evening, but she is not having any lower back pain. Patient advised we could see her tomorrow at 75 30 and she agreed with the time and date.

## 2016-10-06 NOTE — Telephone Encounter (Signed)
Patient has been having right sided flank pain for 2 weeks. She would like someone to call her back. Please advise.

## 2016-10-06 NOTE — Telephone Encounter (Signed)
Patient notified and voiced understanding. Appointment cancelled.

## 2016-10-06 NOTE — Telephone Encounter (Signed)
We see this pt just for DM.  Please see PCP for this.

## 2016-10-07 ENCOUNTER — Ambulatory Visit: Payer: 59 | Admitting: Endocrinology

## 2016-10-13 DIAGNOSIS — R8290 Unspecified abnormal findings in urine: Secondary | ICD-10-CM | POA: Diagnosis not present

## 2016-10-13 DIAGNOSIS — M545 Low back pain: Secondary | ICD-10-CM | POA: Diagnosis not present

## 2016-10-13 MED FILL — tiZANidine HCL 2 MG TABS: 2 | 10 days supply | Qty: 30 | Fill #0

## 2016-10-18 MED FILL — TRADJENTA 5 MG TABLET: 5 | 60 days supply | Qty: 30 | Fill #5

## 2016-10-18 MED FILL — LORazepam 1 MG TABS: 1 | 90 days supply | Qty: 360 | Fill #0

## 2016-10-19 MED FILL — VALACYCLOVIR HCL 500 MG TAB: 500 | 90 days supply | Qty: 90 | Fill #4

## 2016-10-19 MED FILL — PANTOPRAZOLE SOD DR 40 MG T: 40 | 90 days supply | Qty: 180 | Fill #1

## 2016-10-26 MED FILL — TRINTELLIX 20 MG TABLET: 20 | 90 days supply | Qty: 90 | Fill #0

## 2016-10-27 DIAGNOSIS — Z01419 Encounter for gynecological examination (general) (routine) without abnormal findings: Secondary | ICD-10-CM | POA: Diagnosis not present

## 2016-10-28 DIAGNOSIS — L659 Nonscarring hair loss, unspecified: Secondary | ICD-10-CM | POA: Diagnosis not present

## 2016-10-28 DIAGNOSIS — E119 Type 2 diabetes mellitus without complications: Secondary | ICD-10-CM | POA: Diagnosis not present

## 2016-10-28 DIAGNOSIS — E7800 Pure hypercholesterolemia, unspecified: Secondary | ICD-10-CM | POA: Diagnosis not present

## 2016-10-28 DIAGNOSIS — N183 Chronic kidney disease, stage 3 (moderate): Secondary | ICD-10-CM | POA: Diagnosis not present

## 2016-10-28 DIAGNOSIS — E559 Vitamin D deficiency, unspecified: Secondary | ICD-10-CM | POA: Diagnosis not present

## 2016-10-28 DIAGNOSIS — Z0000 Encounter for general adult medical examination without abnormal findings: Secondary | ICD-10-CM | POA: Diagnosis not present

## 2016-10-28 DIAGNOSIS — I10 Essential (primary) hypertension: Secondary | ICD-10-CM | POA: Diagnosis not present

## 2016-10-31 ENCOUNTER — Other Ambulatory Visit: Payer: Self-pay | Admitting: Family Medicine

## 2016-10-31 DIAGNOSIS — E049 Nontoxic goiter, unspecified: Secondary | ICD-10-CM

## 2016-11-01 ENCOUNTER — Ambulatory Visit
Admission: RE | Admit: 2016-11-01 | Discharge: 2016-11-01 | Disposition: A | Discharge status: Home / Self Care | Payer: 59 | Source: Ambulatory Visit | Attending: Family Medicine | Admitting: Family Medicine

## 2016-11-01 DIAGNOSIS — E042 Nontoxic multinodular goiter: Secondary | ICD-10-CM | POA: Diagnosis not present

## 2016-11-01 DIAGNOSIS — E049 Nontoxic goiter, unspecified: Secondary | ICD-10-CM

## 2016-11-01 MED FILL — LOSARTAN POTASSIUM-HCTZ 50-: 50-12.5 | 30 days supply | Qty: 30 | Fill #0

## 2016-11-08 MED FILL — LOSARTAN-HCTZ 100-12.5 MG T: 100-12.5 | 30 days supply | Qty: 30 | Fill #0

## 2016-11-09 ENCOUNTER — Telehealth: Payer: Self-pay | Admitting: Endocrinology

## 2016-11-09 NOTE — Telephone Encounter (Signed)
Patient has two nodules on her thyroid. BS was 123 this morning.   Please call back to discuss treatment for thyroid.  Thank you,  -LL

## 2016-11-10 ENCOUNTER — Other Ambulatory Visit (HOSPITAL_COMMUNITY): Payer: Self-pay | Admitting: Family Medicine

## 2016-11-10 DIAGNOSIS — E041 Nontoxic single thyroid nodule: Secondary | ICD-10-CM

## 2016-11-10 NOTE — Telephone Encounter (Signed)
Called patient and moved appt up to 8/24 ar 8am.

## 2016-11-10 NOTE — Telephone Encounter (Signed)
please call patient: This is very unlikely to be anything to worry about.  Please move up next appt to next avail, and I'll check this.

## 2016-11-22 ENCOUNTER — Ambulatory Visit (HOSPITAL_COMMUNITY): Admission: RE | Admit: 2016-11-22 | Payer: 59 | Source: Ambulatory Visit

## 2016-11-25 ENCOUNTER — Ambulatory Visit (INDEPENDENT_AMBULATORY_CARE_PROVIDER_SITE_OTHER): Payer: 59 | Admitting: Endocrinology

## 2016-11-25 ENCOUNTER — Encounter: Payer: Self-pay | Admitting: Endocrinology

## 2016-11-25 VITALS — BP 136/80 | HR 83 | Wt 218.8 lb

## 2016-11-25 DIAGNOSIS — E1122 Type 2 diabetes mellitus with diabetic chronic kidney disease: Secondary | ICD-10-CM | POA: Diagnosis not present

## 2016-11-25 DIAGNOSIS — N183 Chronic kidney disease, stage 3 unspecified: Secondary | ICD-10-CM

## 2016-11-25 LAB — POCT GLYCOSYLATED HEMOGLOBIN (HGB A1C): Hemoglobin A1C: 6.9

## 2016-11-25 MED ORDER — BROMOCRIPTINE MESYLATE 0.8 MG PO TABS: 0.8000 mg | ORAL_TABLET | Freq: Every day | ORAL | 3 refills | Status: DC

## 2016-11-25 MED FILL — CYCLOSET 0.8 MG TABLET: 0.8 | 30 days supply | Qty: 30 | Fill #0

## 2016-11-25 NOTE — Progress Notes (Signed)
 Subjective:    Patient ID: Deanna Rowe, female    DOB: 04/08/1955, 61 y.o.   MRN: [id number]  HPI Pt returns for f/u of diabetes mellitus: DM type: 2 Dx'ed: 2012 Complications: renal insufficiency.  Therapy: 2 oral meds GDM: never DKA: never Severe hypoglycemia: never Pancreatitis: never Other: she has never been on insulin; she did not tolerate trulitity (n/v and abd pain).   Interval history: Meter is downloaded today, and the printout is scanned into the record.  It varies from 70-200, but most are in the low-100's.  pt states she feels well in general.   Past Medical History:  Diagnosis Date  . Anxiety   . Arthritis    L HIP  . Avascular necrosis of hip (HCC)    LEFT  . Depression   . Diabetes mellitus   . Diabetes mellitus, type II (HCC)   . Dry cough   . GERD (gastroesophageal reflux disease)   . Hypertension   . Insomnia    takes Ambien nightly  . Kidney disease   . Obesity   . PONV (postoperative nausea and vomiting)   . TIA (transient ischemic attack) 2012   no residual problems  . Urgency incontinence     Past Surgical History:  Procedure Laterality Date  . DILATION AND CURETTAGE OF UTERUS    . ESOPHAGEAL MANOMETRY N/A 09/03/2012   Procedure: ESOPHAGEAL MANOMETRY (EM);  Surgeon: Martin K Johnson, MD;  Location: [facility];  Service: Endoscopy;  Laterality: N/A;  . EXPLORATORY LAPAROTOMY    . JOINT REPLACEMENT  2011   rt total hip  . PILONIDAL CYST / SINUS EXCISION    . TOTAL HIP ARTHROPLASTY Left 10/09/2013   Procedure: LEFT TOTAL HIP ARTHROPLASTY ANTERIOR APPROACH;  Surgeon: Frank Aluisio V, MD;  Location: [facility];  Service: Orthopedics;  Laterality: Left;    Social History   Social History  . Marital status: Married    Spouse name: N/A  . Number of children: N/A  . Years of education: N/A   Occupational History  . Not on file.   Social History Main Topics  . Smoking status: Never Smoker  . Smokeless tobacco: Never Used  . Alcohol use  No  . Drug use: No  . Sexual activity: Not Currently   Other Topics Concern  . Not on file   Social History Narrative  . No narrative on file    Current Outpatient Prescriptions on File Prior to Visit  Medication Sig Dispense Refill  . albuterol (PROVENTIL HFA;VENTOLIN HFA) 108 (90 BASE) MCG/ACT inhaler Inhale 2 puffs into the lungs as needed for wheezing or shortness of breath (every 4 hours as needed for cough, wheezing and chest tightness).    . amLODipine (NORVASC) 5 MG tablet Take 5 mg by mouth every morning.     . ARIPiprazole (ABILIFY) 5 MG tablet Take 5 mg by mouth every evening.     . aspirin EC 81 MG tablet Take 81 mg by mouth every evening.    . atorvastatin (LIPITOR) 10 MG tablet Take 10 mg by mouth daily.  2  . beclomethasone (QVAR) 80 MCG/ACT inhaler Inhale 1 puff into the lungs daily as needed.    . Blood Glucose Monitoring Suppl (ACCU-CHEK GUIDE) w/Device KIT 1 each by Does not apply route 3 (three) times daily. 1 kit 2  . buPROPion (WELLBUTRIN XL) 150 MG 24 hr tablet Take 450 mg by mouth every morning.     . CALCIUM-VITAMIN D PO Take   1 tablet by mouth daily.    . cetirizine (ZYRTEC) 10 MG tablet Take 10 mg by mouth daily.    . FOLATE-B12-INTRINSIC FACTOR PO Take 800 mcg by mouth daily.    . furosemide (LASIX) 20 MG tablet Take 20 mg by mouth daily as needed for edema.     . gabapentin (NEURONTIN) 300 MG capsule Take 300 mg by mouth 2 (two) times daily.     . glucose blood (ACCU-CHEK GUIDE) test strip Use to check blood sugar 3 times per day. 200 each 2  . linagliptin (TRADJENTA) 5 MG TABS tablet 1/2 tab daily (Patient taking differently: Take 2.5 mg by mouth daily. 1/2 tab daily) 30 tablet 5  . LORazepam (ATIVAN) 1 MG tablet Take 1 mg by mouth 4 (four) times daily.    . losartan-hydrochlorothiazide (HYZAAR) 100-12.5 MG tablet Take 1 tablet by mouth daily.    . metFORMIN (GLUCOPHAGE-XR) 500 MG 24 hr tablet Take 2 tablets (1,000 mg total) by mouth daily with breakfast.  180 tablet 3  . metoprolol tartrate (LOPRESSOR) 25 MG tablet Take 0.5 tablets (12.5 mg total) by mouth every morning. (Patient taking differently: Take 12.5 mg by mouth 2 (two) times daily. ) 90 tablet 3  . pantoprazole (PROTONIX) 40 MG tablet Take 40 mg by mouth 2 (two) times daily.     . rOPINIRole (REQUIP) 2 MG tablet Take 2 mg by mouth 2 (two) times daily.  3  . valACYclovir (VALTREX) 1000 MG tablet Take 500 mg by mouth daily.     . vitamin C (ASCORBIC ACID) 500 MG tablet Take 500 mg by mouth daily.    . Vitamin D, Ergocalciferol, 2000 units CAPS Take 1 capsule by mouth daily. 2000 units daily.    . vortioxetine HBr (TRINTELLIX) 20 MG TABS Take 20 mg by mouth daily.    . zolpidem (AMBIEN) 5 MG tablet Take 5 mg by mouth at bedtime.      Current Facility-Administered Medications on File Prior to Visit  Medication Dose Route Frequency Provider Last Rate Last Dose  . tranexamic acid (CYKLOKAPRON) topical -INTRAOP  2,000 mg Topical Once Constable, Amber, PA-C        Allergies  Allergen Reactions  . Fluticasone-Salmeterol Anaphylaxis  . Codeine Nausea And Vomiting  . Fetzima [Levomilnacipran] Nausea And Vomiting  . Nsaids Other (See Comments)    Upset stomach   . Trulicity [Dulaglutide] Nausea And Vomiting    Significant and undesirably rapid (40 lbs) weight loss   . Xanax [Alprazolam] Other (See Comments)    disoriented  . Amoxicillin Rash    Has patient had a PCN reaction causing immediate rash, facial/tongue/throat swelling, SOB or lightheadedness with hypotension: Yes Has patient had a PCN reaction causing severe rash involving mucus membranes or skin necrosis: No Has patient had a PCN reaction that required hospitalization: No Has patient had a PCN reaction occurring within the last 10 years: No If all of the above answers are "NO", then may proceed with Cephalosporin use.   . Pseudoephedrine Hcl Er Palpitations    Family History  Problem Relation Age of Onset  . Alcohol  abuse Brother   . Alcohol abuse Brother   . Alcohol abuse Brother   . Alcohol abuse Brother   . Diabetes Neg Hx     BP 136/80   Pulse 83   Wt 218 lb 12.8 oz (99.2 kg)   SpO2 96%   BMI 35.32 kg/m    Review of Systems She denies hypoglycemia.        Objective:   Physical Exam VITAL SIGNS:  See vs page GENERAL: no distress Pulses: foot pulses are intact bilaterally.   MSK: no deformity of the feet or ankles.  CV: trace bilat leg edema Skin:  no ulcer on the feet or ankles.  normal color and temp on the feet and ankles Neuro: sensation is intact to touch on the feet and ankles.   Ext: There is bilateral onychomycosis of the toenails.    Lab Results  Component Value Date   CREATININE 1.3 (H) 09/12/2016   BUN 14.9 09/12/2016   NA 141 09/12/2016   K 4.4 09/12/2016   CL 102 04/16/2015   CO2 27 09/12/2016     Lab Results  Component Value Date   HGBA1C 6.9 11/25/2016      Assessment & Plan:  Type 2 DM: she needs increased rx, if it can be done with a regimen that avoids or minimizes hypoglycemia.   Incont: this is a relative contraindication to invokana Renal insuff: this limits rx options.   Patient Instructions  I have sent a prescription to your pharmacy, to add "cycloset." check your blood sugar once a day.  vary the time of day when you check, between before the 3 meals, and at bedtime.  also check if you have symptoms of your blood sugar being too high or too low.  please keep a record of the readings and bring it to your next appointment here (or you can bring the meter itself).  You can write it on any piece of paper.  please call us sooner if your blood sugar goes below 70, or if you have a lot of readings over 200.   Please come back for a follow-up appointment in 6 months.

## 2016-11-25 NOTE — Patient Instructions (Signed)
I have sent a prescription to your pharmacy, to add "cycloset." check your blood sugar once a day.  vary the time of day when you check, between before the 3 meals, and at bedtime.  also check if you have symptoms of your blood sugar being too high or too low.  please keep a record of the readings and bring it to your next appointment here (or you can bring the meter itself).  You can write it on any piece of paper.  please call us sooner if your blood sugar goes below 70, or if you have a lot of readings over 200.   Please come back for a follow-up appointment in 6 months.

## 2016-11-28 MED FILL — AMLODIPINE BESYLATE 5 MG TA: 5 | 90 days supply | Qty: 90 | Fill #0

## 2016-11-28 MED FILL — ZOLPIDEM TARTRATE 5 MG TAB: 5 | 90 days supply | Qty: 90 | Fill #0

## 2016-11-28 MED FILL — METFORMIN HCL ER 500 MG TAB: 500 | 89 days supply | Qty: 178 | Fill #2

## 2016-11-28 MED FILL — ARIPiprazole 5 MG TABS: 5 | 90 days supply | Qty: 90 | Fill #0

## 2016-11-28 MED FILL — ATORVASTATIN 10 MG TABLET: 10 | 90 days supply | Qty: 90 | Fill #1

## 2016-11-28 MED FILL — GABAPENTIN 300 MG CAPSULE: 300 | 90 days supply | Qty: 180 | Fill #0

## 2016-11-29 ENCOUNTER — Ambulatory Visit (HOSPITAL_COMMUNITY)
Admission: RE | Admit: 2016-11-29 | Discharge: 2016-11-29 | Disposition: A | Discharge status: Home / Self Care | Payer: 59 | Source: Ambulatory Visit | Attending: Family Medicine | Admitting: Family Medicine

## 2016-11-29 DIAGNOSIS — E041 Nontoxic single thyroid nodule: Secondary | ICD-10-CM | POA: Insufficient documentation

## 2016-11-29 MED ORDER — LIDOCAINE HCL (PF) 1 % IJ SOLN
INTRAMUSCULAR | Status: AC
  Filled 2016-11-29: qty 30

## 2016-11-29 NOTE — Procedures (Signed)
  US guided isthmus nodule biopsy #1 nodule per [facility] criteria  4  25 gauge needle samples  Tolerated well  Samples to cytology

## 2016-12-01 DIAGNOSIS — E041 Nontoxic single thyroid nodule: Secondary | ICD-10-CM | POA: Diagnosis not present

## 2016-12-01 DIAGNOSIS — Z79899 Other long term (current) drug therapy: Secondary | ICD-10-CM | POA: Diagnosis not present

## 2016-12-02 ENCOUNTER — Other Ambulatory Visit: Payer: Self-pay | Admitting: Endocrinology

## 2016-12-02 MED FILL — LOSARTAN-HCTZ 100-12.5 MG T: 100-12.5 | 30 days supply | Qty: 30 | Fill #1

## 2016-12-06 ENCOUNTER — Other Ambulatory Visit: Payer: Self-pay | Admitting: Endocrinology

## 2016-12-06 MED FILL — ACCU-CHEK GUIDE TEST STRIP: 67 days supply | Qty: 200 | Fill #2

## 2016-12-06 MED FILL — TRADJENTA 5 MG TABLET: 5 | 60 days supply | Qty: 30 | Fill #0

## 2016-12-06 MED FILL — ACCU-CHEK FASTCLIX LANCETS: 34 days supply | Qty: 102 | Fill #0

## 2016-12-12 ENCOUNTER — Ambulatory Visit: Payer: Self-pay | Admitting: Endocrinology

## 2016-12-15 MED FILL — rOPINIRole HCL 2 MG TABS: 2 | 90 days supply | Qty: 180 | Fill #1

## 2016-12-19 ENCOUNTER — Ambulatory Visit: Payer: Self-pay | Admitting: Endocrinology

## 2016-12-19 DIAGNOSIS — M7062 Trochanteric bursitis, left hip: Secondary | ICD-10-CM | POA: Diagnosis not present

## 2016-12-19 DIAGNOSIS — Z96642 Presence of left artificial hip joint: Secondary | ICD-10-CM | POA: Diagnosis not present

## 2016-12-20 ENCOUNTER — Other Ambulatory Visit: Payer: Self-pay | Admitting: Endocrinology

## 2016-12-20 ENCOUNTER — Encounter: Payer: Self-pay | Admitting: Endocrinology

## 2016-12-20 MED ORDER — BROMOCRIPTINE MESYLATE 0.8 MG PO TABS: 1.6000 mg | ORAL_TABLET | Freq: Every day | ORAL | 3 refills | Status: DC

## 2016-12-21 MED FILL — CYCLOSET 0.8 MG TABLET: 0.8 | 30 days supply | Qty: 60 | Fill #0

## 2016-12-22 DIAGNOSIS — M8588 Other specified disorders of bone density and structure, other site: Secondary | ICD-10-CM | POA: Diagnosis not present

## 2016-12-22 DIAGNOSIS — E2839 Other primary ovarian failure: Secondary | ICD-10-CM | POA: Diagnosis not present

## 2016-12-29 MED FILL — buPROPion HCL ER (XL) 150 M: 150 | 90 days supply | Qty: 270 | Fill #1

## 2016-12-29 MED FILL — LOSARTAN-HCTZ 100-12.5 MG T: 100-12.5 | 30 days supply | Qty: 30 | Fill #2

## 2016-12-31 DIAGNOSIS — J0100 Acute maxillary sinusitis, unspecified: Secondary | ICD-10-CM | POA: Diagnosis not present

## 2016-12-31 DIAGNOSIS — J3089 Other allergic rhinitis: Secondary | ICD-10-CM | POA: Diagnosis not present

## 2017-01-02 DIAGNOSIS — R251 Tremor, unspecified: Secondary | ICD-10-CM | POA: Diagnosis not present

## 2017-01-05 NOTE — Progress Notes (Signed)
Marland Kitchen    HEMATOLOGY/ONCOLOGY CONSULTATION NOTE  Date of Service: 01/12/2017  Patient Care Team: Leighton Ruff, MD as PCP - General (Family Medicine) Clementeen Graham, [facility] as [facility] (Pharmacist)  CHIEF COMPLAINTS/PURPOSE OF CONSULTATION:  Abnormal CBC  HISTORY OF PRESENTING ILLNESS:   Deanna Rowe is a wonderful 61 y.o. female who has been referred to [name] by Dr .Leighton Ruff, MD  for evaluation and management of "Abnormal CBC".  Patient has a history of hypertension, diabetes with neuropathy, chronic kidney disease, TIA, restless leg syndrome, allergic rhinitis, tinnitus, cough variant asthma, achalasia was noted on recent labs to have RBC macrocytosis with mild anemia on routine labs by her primary care physician.  Labs done on 07/21/2016 showed a hemoglobin of 11.5 with MCV of 101.1 normal WBC count of 7.4k and normal platelet count of 213k. Based on review of labs sent the patient has had borderline macrocytosis since at least April 2016 with mild anemia.  Recent labs showed a B12 level that was low normal at 280 and normal folate level of 18.5. Patient has been on chronic metformin for several years which in addition to Abilify can certainly cause some macrocytosis.  Her ongoing metformin use as well as PPI use also puts her at high risk for B12 deficiency. Patient hasn't had any significant anemia from this. No focal bone pains.  INTERVAL HISTORY:   Of note since the patient last visit, she has had an US Thyroid completed on 11/01/2016 with results revealing: IMPRESSION: There are 2 nodules in the isthmus. Nodule 1 meets criteria for fine needle aspiration biopsy. Nodule to meets criteria for annual follow-up. He also underwent a fine needle aspiration of his thyroid on 11/29/2016 with results of: Diagnosis, thyroid, fine needle aspiration, isthmus (speciment 1 of 1, collected 11/29/16): Consistent with benign follicular nodule (bethesda  category II). The gland is mildly enlarged, overall.  Pt presents to the office today and notes that she has been doing well overall. She notes that there are no issues with taking the B12 and B complex vitamins. Pt has been taking the TUMS as well. She notes that she had a biopsy completed to the thyroid area and reports right sided neck pain following.    On review of systems, pt reports right sided neck pain. She reports mild BLE swelling. She reports intermittent constipation, but denies abdominal pain.    MEDICAL HISTORY:  Past Medical History:  Diagnosis Date  . Anxiety   . Arthritis    L HIP  . Avascular necrosis of hip (HCC)    LEFT  . Depression   . Diabetes mellitus   . Diabetes mellitus, type II ([place])   . Dry cough   . GERD (gastroesophageal reflux disease)   . Hypertension   . Insomnia    takes Ambien nightly  . Kidney disease   . Obesity   . PONV (postoperative nausea and vomiting)   . TIA (transient ischemic attack) 2012   no residual problems  . Urgency incontinence     SURGICAL HISTORY: Past Surgical History:  Procedure Laterality Date  . DILATION AND CURETTAGE OF UTERUS    . ESOPHAGEAL MANOMETRY N/A 09/03/2012   Procedure: ESOPHAGEAL MANOMETRY (EM);  Surgeon: Garlan Fair, MD;  Location: [facility];  Service: Endoscopy;  Laterality: N/A;  . EXPLORATORY LAPAROTOMY    . JOINT REPLACEMENT  2011   rt total hip  . PILONIDAL CYST / SINUS EXCISION    . TOTAL  HIP ARTHROPLASTY Left 10/09/2013   Procedure: LEFT TOTAL HIP ARTHROPLASTY ANTERIOR APPROACH;  Surgeon: [name], MD;  Location: [facility];  Service: Orthopedics;  Laterality: Left;    SOCIAL HISTORY: Social History   Social History  . Marital status: Married    Spouse name: N/A  . Number of children: N/A  . Years of education: N/A   Occupational History  . Not on file.   Social History Main Topics  . Smoking status: Never Smoker  . Smokeless tobacco: Never Used  . Alcohol use No  . Drug  use: No  . Sexual activity: Not Currently   Other Topics Concern  . Not on file   Social History Narrative  . No narrative on file    FAMILY HISTORY: Family History  Problem Relation Age of Onset  . Alcohol abuse Brother   . Alcohol abuse Brother   . Alcohol abuse Brother   . Alcohol abuse Brother   . Diabetes Neg Hx     ALLERGIES:  is allergic to fluticasone-salmeterol; codeine; fetzima [levomilnacipran]; nsaids; trulicity [dulaglutide]; xanax [alprazolam]; amoxicillin; and pseudoephedrine hcl er.  MEDICATIONS:  Current Outpatient Prescriptions  Medication Sig Dispense Refill  . ACCU-CHEK FASTCLIX LANCETS MISC USE TO CHECK BLOOD SUGAR 3 TIMES A DAY 102 each 2  . albuterol (PROVENTIL HFA;VENTOLIN HFA) 108 (90 BASE) MCG/ACT inhaler Inhale 2 puffs into the lungs as needed for wheezing or shortness of breath (every 4 hours as needed for cough, wheezing and chest tightness).    Marland Kitchen amLODipine (NORVASC) 5 MG tablet Take 5 mg by mouth every morning.     . ARIPiprazole (ABILIFY) 5 MG tablet Take 5 mg by mouth every evening.     Marland Kitchen aspirin EC 81 MG tablet Take 81 mg by mouth every evening.    Marland Kitchen atorvastatin (LIPITOR) 10 MG tablet Take 10 mg by mouth daily.  2  . beclomethasone (QVAR) 80 MCG/ACT inhaler Inhale 1 puff into the lungs daily as needed.    . Bromocriptine Mesylate (CYCLOSET) 0.8 MG TABS Take 2 tablets (1.6 mg total) by mouth daily. 180 tablet 3  . buPROPion (WELLBUTRIN XL) 150 MG 24 hr tablet Take 450 mg by mouth every morning.     Marland Kitchen CALCIUM-VITAMIN D PO Take 1 tablet by mouth daily.    . cetirizine (ZYRTEC) 10 MG tablet Take 10 mg by mouth daily.    Marland Kitchen FOLATE-B12-INTRINSIC FACTOR PO Take 800 mcg by mouth daily.    . furosemide (LASIX) 20 MG tablet Take 20 mg by mouth daily as needed for edema.     . gabapentin (NEURONTIN) 300 MG capsule Take 300 mg by mouth 2 (two) times daily.     Marland Kitchen glucose blood (ACCU-CHEK GUIDE) test strip Use to check blood sugar 3 times per day. 200 each 2   . LORazepam (ATIVAN) 1 MG tablet Take 1 mg by mouth 4 (four) times daily.    Marland Kitchen losartan-hydrochlorothiazide (HYZAAR) 100-12.5 MG tablet Take 1 tablet by mouth daily.    . metFORMIN (GLUCOPHAGE-XR) 500 MG 24 hr tablet Take 2 tablets (1,000 mg total) by mouth daily with breakfast. 180 tablet 3  . metoprolol tartrate (LOPRESSOR) 25 MG tablet Take 0.5 tablets (12.5 mg total) by mouth every morning. (Patient taking differently: Take 12.5 mg by mouth 2 (two) times daily. ) 90 tablet 3  . pantoprazole (PROTONIX) 40 MG tablet Take 40 mg by mouth 2 (two) times daily.     Marland Kitchen rOPINIRole (REQUIP) 2 MG tablet  Take 2 mg by mouth 2 (two) times daily.  3  . TRADJENTA 5 MG TABS tablet TAKE 1/2 TABLET BY MOUTH EVERYDAY 30 tablet 5  . valACYclovir (VALTREX) 1000 MG tablet Take 500 mg by mouth daily.     . vitamin C (ASCORBIC ACID) 500 MG tablet Take 500 mg by mouth daily.    . Vitamin D, Ergocalciferol, 2000 units CAPS Take 1 capsule by mouth daily. 2000 units daily.    Marland Kitchen vortioxetine HBr (TRINTELLIX) 20 MG TABS Take 20 mg by mouth daily.    Marland Kitchen zolpidem (AMBIEN) 5 MG tablet Take 5 mg by mouth at bedtime.      No current facility-administered medications for this visit.    Facility-Administered Medications Ordered in Other Visits  Medication Dose Route Frequency Provider Last Rate Last Dose  . tranexamic acid (CYKLOKAPRON) topical -INTRAOP  2,000 mg Topical Once Cecilio Asper, [employer], PA-C        REVIEW OF SYSTEMS:    10 Point review of Systems was done is negative except as noted above.  PHYSICAL EXAMINATION:  ECOG PERFORMANCE STATUS: 1 - Symptomatic but completely ambulatory  . Vitals:   01/12/17 0948  BP: 139/85  Pulse: 73  Resp: 20  Temp: 98.1 F (36.7 C)  SpO2: 97%   Filed Weights   01/12/17 0948  Weight: 224 lb 11.2 oz (101.9 kg)   .Body mass index is 36.27 kg/m.  GENERAL:alert, in no acute distress and comfortable SKIN: no acute rashes, no significant lesions EYES: conjunctiva are pink  and non-injected, sclera anicteric OROPHARYNX: MMM, no exudates, no oropharyngeal erythema or ulceration NECK: supple, no JVD LYMPH:  no palpable lymphadenopathy in the cervical, axillary or inguinal regions LUNGS: clear to auscultation b/l with normal respiratory effort HEART: regular rate & rhythm ABDOMEN:  normoactive bowel sounds , non tender, not distended. No palpable hepatosplenomegaly. Extremity: no pedal edema PSYCH: alert & oriented x 3 with fluent speech NEURO: no focal motor/sensory deficits  LABORATORY DATA:  I have reviewed the data as listed  . CBC Latest Ref Rng & Units 01/12/2017 09/12/2016 09/12/2016  WBC 3.9 - 10.3 10e3/uL 5.5 6.4 -  Hemoglobin 11.6 - 15.9 g/dL 11.4(L) 11.8 -  Hematocrit 34.8 - 46.6 % 35.2 34.6 36.6  Platelets 145 - 400 10e3/uL 228 238 -    . CMP Latest Ref Rng & Units 01/12/2017 09/12/2016 04/16/2015  Glucose 70 - 140 mg/dl 142(H) 68(L) 80  BUN 7.0 - 26.0 mg/dL 12.0 14.9 15  Creatinine 0.6 - 1.1 mg/dL 1.3(H) 1.3(H) 1.21(H)  Sodium 136 - 145 mEq/L 142 141 140  Potassium 3.5 - 5.1 mEq/L 4.2 4.4 4.9  Chloride 98 - 110 mmol/L - - 102  CO2 22 - 29 mEq/L 28 27 27   Calcium 8.4 - 10.4 mg/dL 9.6 9.8 10.3  Total Protein 6.4 - 8.3 g/dL 7.2 7.7 7.6  Total Bilirubin 0.20 - 1.20 mg/dL 0.28 0.42 0.4  Alkaline Phos 40 - 150 U/L 43 41 45  AST 5 - 34 U/L 19 18 17   ALT 0 - 55 U/L 18 13 13    Component     Latest Ref Rng & Units 09/12/2016  Folate, Hemolysate     Not Estab. ng/mL 388.8  HCT     34.0 - 46.6 % 34.6  Folate, RBC     >498 ng/mL 1,124  LDH     125 - 245 U/L 185  Vitamin B12     232 - 1,245 pg/mL 358   Component  Latest Ref Rng & Units 01/12/2017  Vitamin B12     232 - 1,245 pg/mL 640    RADIOGRAPHIC STUDIES: I have personally reviewed the radiological images as listed and agreed with the findings in the report.  ASSESSMENT & PLAN:   61 year old female with multiple medical comorbidities as noted above with  #1 RBC macrocytosis  with mild anemia.  The macrocytosis would certainly be from chronic metformin use and the use of Abilify - both of which can cause some macrocytosis. Patient had borderline low B12 levels of 280 and normal folate levels. Chronic use of metformin and PPI can be risk factors for B12 deficiency and some other deficiencies. B12 improved from 358 to 640 today 01/12/17  Normal LDH suggests no evidence of hemolysis. The likelihood of MDS of the patient's age and in the absence of significant progressive anemia or other CBC abnormalities. Plan -I advised the patient to continue taking OTC B complex vitamins and B12 vitamins.  -No indication for additional workup at this time -we discussed that the RBC macrocytosis is likely medication related.  RTC with Dr. Irene Limbo PRN or sooner if her symptoms worsen  #2  Patient Active Problem List   Diagnosis Date Noted  . UTI (urinary tract infection) 12/01/2015  . Diabetes ([place]) 06/03/2015  . 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 2 diabetes mellitus ([place]) 04/24/2014  . Postoperative anemia due to acute blood loss 10/10/2013  . Avascular necrosis of bone of left hip ([place]) 10/09/2013  . OA (osteoarthritis) of hip 10/09/2013  . Cough 07/31/2013  . Shortness of breath 06/18/2013  . Anxiety   . Esophageal dysphagia 09/12/2012  . Depression 02/21/2012  . Stroke [facility]) 02/11/2012  -Continue follow-up with primary care physician for management of other medical comorbidities.  RTC with Dr. Irene Limbo PRN or sooner if her symptoms worsen  All of the patients questions were answered with apparent satisfaction. The patient knows to call the clinic with any problems, questions or concerns.  I spent 15 minutes counseling the patient face to face. The total time spent in the appointment was 20 minutes and more than 50% was on counseling and direct patient cares.    Sullivan Lone MD [place] AAHIVMS [facility] Hematology/Oncology Physician [facility]  (Office):        [phone] (Work cell):  [phone] (Fax):           [phone]  01/12/2017 10:01 AM   This document serves as a record of services personally performed by Sullivan Lone, MD. It was created on her behalf by Steva Colder, a trained medical scribe. The creation of this record is based on the scribe's personal observations and the provider's statements to them. This document has been checked and approved by the attending provider.

## 2017-01-10 DIAGNOSIS — H5213 Myopia, bilateral: Secondary | ICD-10-CM | POA: Diagnosis not present

## 2017-01-10 LAB — HM DIABETES EYE EXAM

## 2017-01-12 ENCOUNTER — Encounter: Payer: Self-pay | Admitting: Hematology

## 2017-01-12 ENCOUNTER — Other Ambulatory Visit: Payer: Self-pay

## 2017-01-12 ENCOUNTER — Ambulatory Visit (HOSPITAL_BASED_OUTPATIENT_CLINIC_OR_DEPARTMENT_OTHER): Payer: 59 | Admitting: Hematology

## 2017-01-12 ENCOUNTER — Other Ambulatory Visit (HOSPITAL_BASED_OUTPATIENT_CLINIC_OR_DEPARTMENT_OTHER): Payer: 59

## 2017-01-12 VITALS — BP 139/85 | HR 73 | Temp 98.1°F | Resp 20 | Ht 66.0 in | Wt 224.7 lb

## 2017-01-12 DIAGNOSIS — E1140 Type 2 diabetes mellitus with diabetic neuropathy, unspecified: Secondary | ICD-10-CM

## 2017-01-12 DIAGNOSIS — E538 Deficiency of other specified B group vitamins: Secondary | ICD-10-CM

## 2017-01-12 DIAGNOSIS — N189 Chronic kidney disease, unspecified: Secondary | ICD-10-CM | POA: Diagnosis not present

## 2017-01-12 DIAGNOSIS — D649 Anemia, unspecified: Secondary | ICD-10-CM

## 2017-01-12 DIAGNOSIS — I10 Essential (primary) hypertension: Secondary | ICD-10-CM | POA: Diagnosis not present

## 2017-01-12 DIAGNOSIS — K5900 Constipation, unspecified: Secondary | ICD-10-CM

## 2017-01-12 DIAGNOSIS — R7989 Other specified abnormal findings of blood chemistry: Secondary | ICD-10-CM | POA: Diagnosis not present

## 2017-01-12 DIAGNOSIS — M7989 Other specified soft tissue disorders: Secondary | ICD-10-CM

## 2017-01-12 DIAGNOSIS — D539 Nutritional anemia, unspecified: Secondary | ICD-10-CM | POA: Diagnosis not present

## 2017-01-12 DIAGNOSIS — M542 Cervicalgia: Secondary | ICD-10-CM

## 2017-01-12 LAB — CBC WITH DIFFERENTIAL/PLATELET
BASO%: 0.2 % (ref 0.0–2.0)
Basophils Absolute: 0 10*3/uL (ref 0.0–0.1)
EOS%: 1.1 % (ref 0.0–7.0)
Eosinophils Absolute: 0.1 10*3/uL (ref 0.0–0.5)
HCT: 35.2 % (ref 34.8–46.6)
HGB: 11.4 g/dL — ABNORMAL LOW (ref 11.6–15.9)
LYMPH%: 41.4 % (ref 14.0–49.7)
MCH: 32.7 pg (ref 25.1–34.0)
MCHC: 32.4 g/dL (ref 31.5–36.0)
MCV: 100.9 fL (ref 79.5–101.0)
MONO#: 0.5 10*3/uL (ref 0.1–0.9)
MONO%: 8.2 % (ref 0.0–14.0)
NEUT#: 2.7 10*3/uL (ref 1.5–6.5)
NEUT%: 49.1 % (ref 38.4–76.8)
Platelets: 228 10*3/uL (ref 145–400)
RBC: 3.49 10*6/uL — ABNORMAL LOW (ref 3.70–5.45)
RDW: 14.1 % (ref 11.2–14.5)
WBC: 5.5 10*3/uL (ref 3.9–10.3)
lymph#: 2.3 10*3/uL (ref 0.9–3.3)

## 2017-01-12 LAB — COMPREHENSIVE METABOLIC PANEL
ALT: 18 U/L (ref 0–55)
AST: 19 U/L (ref 5–34)
Albumin: 3.5 g/dL (ref 3.5–5.0)
Alkaline Phosphatase: 43 U/L (ref 40–150)
Anion Gap: 9 mEq/L (ref 3–11)
BUN: 12 mg/dL (ref 7.0–26.0)
CO2: 28 mEq/L (ref 22–29)
Calcium: 9.6 mg/dL (ref 8.4–10.4)
Chloride: 105 mEq/L (ref 98–109)
Creatinine: 1.3 mg/dL — ABNORMAL HIGH (ref 0.6–1.1)
EGFR: 52 mL/min/{1.73_m2} — ABNORMAL LOW (ref >60)
Glucose: 142 mg/dl — ABNORMAL HIGH (ref 70–140)
Potassium: 4.2 mEq/L (ref 3.5–5.1)
Sodium: 142 mEq/L (ref 136–145)
Total Bilirubin: 0.28 mg/dL (ref 0.20–1.20)
Total Protein: 7.2 g/dL (ref 6.4–8.3)

## 2017-01-13 LAB — VITAMIN B12: Vitamin B12: 640 pg/mL (ref 232–1245)

## 2017-01-17 MED FILL — PANTOPRAZOLE SOD DR 40 MG T: 40 | 90 days supply | Qty: 180 | Fill #2

## 2017-01-17 MED FILL — CYCLOSET 0.8 MG TABLET: 0.8 | 30 days supply | Qty: 60 | Fill #1

## 2017-01-18 ENCOUNTER — Other Ambulatory Visit: Payer: Self-pay | Admitting: Family Medicine

## 2017-01-18 DIAGNOSIS — Z1231 Encounter for screening mammogram for malignant neoplasm of breast: Secondary | ICD-10-CM

## 2017-01-18 MED FILL — METOPROLOL TARTRATE 25 MG T: 25 | 90 days supply | Qty: 90 | Fill #1

## 2017-01-18 MED FILL — TRINTELLIX 20 MG TABLET: 20 | 90 days supply | Qty: 90 | Fill #1

## 2017-01-18 MED FILL — VALACYCLOVIR HCL 500 MG TAB: 500 | 90 days supply | Qty: 90 | Fill #0

## 2017-01-19 ENCOUNTER — Encounter: Payer: Self-pay | Admitting: Endocrinology

## 2017-01-25 DIAGNOSIS — I129 Hypertensive chronic kidney disease with stage 1 through stage 4 chronic kidney disease, or unspecified chronic kidney disease: Secondary | ICD-10-CM | POA: Diagnosis not present

## 2017-01-25 DIAGNOSIS — N2581 Secondary hyperparathyroidism of renal origin: Secondary | ICD-10-CM | POA: Diagnosis not present

## 2017-01-25 DIAGNOSIS — N183 Chronic kidney disease, stage 3 (moderate): Secondary | ICD-10-CM | POA: Diagnosis not present

## 2017-01-25 DIAGNOSIS — D631 Anemia in chronic kidney disease: Secondary | ICD-10-CM | POA: Diagnosis not present

## 2017-01-31 ENCOUNTER — Other Ambulatory Visit (HOSPITAL_BASED_OUTPATIENT_CLINIC_OR_DEPARTMENT_OTHER): Payer: Self-pay | Admitting: Nephrology

## 2017-01-31 DIAGNOSIS — N183 Chronic kidney disease, stage 3 unspecified: Secondary | ICD-10-CM

## 2017-02-01 ENCOUNTER — Other Ambulatory Visit: Payer: Self-pay | Admitting: Endocrinology

## 2017-02-01 MED FILL — LORazepam 1 MG TABS: 1 | 90 days supply | Qty: 360 | Fill #1

## 2017-02-01 MED FILL — ACCU-CHEK FASTCLIX LANCETS: 34 days supply | Qty: 102 | Fill #1

## 2017-02-02 ENCOUNTER — Ambulatory Visit (HOSPITAL_BASED_OUTPATIENT_CLINIC_OR_DEPARTMENT_OTHER)
Admission: RE | Admit: 2017-02-02 | Discharge: 2017-02-02 | Disposition: A | Discharge status: Home / Self Care | Payer: 59 | Source: Ambulatory Visit | Attending: Nephrology | Admitting: Nephrology

## 2017-02-02 DIAGNOSIS — R93422 Abnormal radiologic findings on diagnostic imaging of left kidney: Secondary | ICD-10-CM | POA: Diagnosis not present

## 2017-02-02 DIAGNOSIS — R93421 Abnormal radiologic findings on diagnostic imaging of right kidney: Secondary | ICD-10-CM | POA: Diagnosis not present

## 2017-02-02 DIAGNOSIS — N183 Chronic kidney disease, stage 3 unspecified: Secondary | ICD-10-CM

## 2017-02-02 MED FILL — LOSARTAN-HCTZ 100-12.5 MG T: 100-12.5 | 90 days supply | Qty: 90 | Fill #0

## 2017-02-02 MED FILL — ACCU-CHEK GUIDE TEST STRIP: 34 days supply | Qty: 100 | Fill #0

## 2017-02-13 MED FILL — ZOLPIDEM TARTRATE 5 MG TABL: 5 | 90 days supply | Qty: 90 | Fill #1

## 2017-02-13 MED FILL — CYCLOSET 0.8 MG TABLET: 0.8 | 30 days supply | Qty: 60 | Fill #2

## 2017-02-13 MED FILL — ARIPiprazole 5 MG TABS: 5 | 90 days supply | Qty: 90 | Fill #1

## 2017-02-13 MED FILL — TRADJENTA 5 MG TABLET: 5 | 60 days supply | Qty: 30 | Fill #1

## 2017-02-16 ENCOUNTER — Ambulatory Visit
Admission: RE | Admit: 2017-02-16 | Discharge: 2017-02-16 | Disposition: A | Discharge status: Home / Self Care | Payer: 59 | Source: Ambulatory Visit | Attending: Family Medicine | Admitting: Family Medicine

## 2017-02-16 DIAGNOSIS — Z1231 Encounter for screening mammogram for malignant neoplasm of breast: Secondary | ICD-10-CM | POA: Diagnosis not present

## 2017-02-21 MED FILL — AMLODIPINE BESYLATE 5 MG TA: 5 | 90 days supply | Qty: 90 | Fill #0

## 2017-02-22 MED FILL — GABAPENTIN 300 MG CAPSULE: 300 | 90 days supply | Qty: 180 | Fill #0

## 2017-02-22 MED FILL — METFORMIN HCL ER 500 MG TAB: 500 | 89 days supply | Qty: 178 | Fill #3

## 2017-02-22 MED FILL — ATORVASTATIN 10 MG TABLET: 10 | 90 days supply | Qty: 90 | Fill #2

## 2017-03-01 ENCOUNTER — Encounter: Payer: Self-pay | Admitting: *Deleted

## 2017-03-02 ENCOUNTER — Other Ambulatory Visit: Payer: Self-pay

## 2017-03-02 ENCOUNTER — Ambulatory Visit (INDEPENDENT_AMBULATORY_CARE_PROVIDER_SITE_OTHER): Payer: 59 | Admitting: Neurology

## 2017-03-02 ENCOUNTER — Encounter: Payer: Self-pay | Admitting: Neurology

## 2017-03-02 DIAGNOSIS — R251 Tremor, unspecified: Secondary | ICD-10-CM | POA: Diagnosis not present

## 2017-03-02 DIAGNOSIS — G2111 Neuroleptic induced parkinsonism: Secondary | ICD-10-CM

## 2017-03-02 DIAGNOSIS — G20 Parkinson's disease: Secondary | ICD-10-CM | POA: Diagnosis not present

## 2017-03-02 DIAGNOSIS — G20C Parkinsonism, unspecified: Secondary | ICD-10-CM

## 2017-03-02 HISTORY — DX: Parkinson's disease: G20

## 2017-03-02 HISTORY — DX: Parkinsonism, unspecified: G20.C

## 2017-03-02 NOTE — Progress Notes (Signed)
Reason for visit: Tremor  Referring physician: [name] is a 61 y.o. female  History of present illness:  Deanna Rowe is a 61 year old right-handed black female with a history of cerebrovascular disease with prior TIA, and history of diabetes and hypertension.  The patient has had severe depression, she is followed through Dr. Reece Levy for this.  The patient has been on Abilify, she has been on a 5 mg daily dose for at least 2 years.  The patient has noted over the last year that she began to have some resting tremors affecting the right upper extremity, her husband has also noted a head tremor associated with a head nod.  The patient has had gradual worsening of the tremor over time, the tremor affects only the right side.  The patient has not noted any change in her handwriting.  She does have some occasional dizziness, she may stagger on occasion but she does not fall.  She has not had any weakness of the extremities, she does have some numbness in the toes associated with her diabetes.  The patient denies any difficulty controlling the bowels of the bladder.  She has not had any changes in speech or swallowing.  She denies any family history of tremor.  She is sent to this office for further evaluation.  Past Medical History:  Diagnosis Date  . Anxiety   . Arthritis    L HIP  . Avascular necrosis of hip (HCC)    LEFT  . Depression   . Diabetes mellitus   . Diabetes mellitus, type II ([place])   . Dry cough   . GERD (gastroesophageal reflux disease)   . Hypertension   . Insomnia    takes Ambien nightly  . Kidney disease   . Obesity   . PONV (postoperative nausea and vomiting)   . TIA (transient ischemic attack) 2012   no residual problems  . Urgency incontinence     Past Surgical History:  Procedure Laterality Date  . DILATION AND CURETTAGE OF UTERUS    . ESOPHAGEAL MANOMETRY N/A 09/03/2012   Procedure: ESOPHAGEAL MANOMETRY (EM);  Surgeon: Garlan Fair, MD;   Location: [facility];  Service: Endoscopy;  Laterality: N/A;  . EXPLORATORY LAPAROTOMY    . JOINT REPLACEMENT  2011   rt total hip  . PILONIDAL CYST / SINUS EXCISION    . TOTAL HIP ARTHROPLASTY Left 10/09/2013   Procedure: LEFT TOTAL HIP ARTHROPLASTY ANTERIOR APPROACH;  Surgeon: [name], MD;  Location: [facility];  Service: Orthopedics;  Laterality: Left;    Family History  Problem Relation Age of Onset  . Alcohol abuse Brother   . Alcohol abuse Brother   . Alcohol abuse Brother   . Alcohol abuse Brother   . Diabetes Neg Hx     Social history:  reports that  has never smoked. she has never used smokeless tobacco. She reports that she does not drink alcohol or use drugs.  Medications:  Prior to Admission medications   Medication Sig Start Date End Date Taking? Authorizing Provider  ACCU-CHEK FASTCLIX LANCETS MISC USE TO CHECK BLOOD SUGAR 3 TIMES A DAY 12/06/16  Yes Renato Shin, MD  ACCU-CHEK GUIDE test strip USE TO CHECK BLOOD SUGAR 3 TIMES PER DAY. 02/02/17  Yes Renato Shin, MD  albuterol (PROVENTIL HFA;VENTOLIN HFA) 108 (90 BASE) MCG/ACT inhaler Inhale 2 puffs into the lungs as needed for wheezing or shortness of breath (every 4 hours as needed for  cough, wheezing and chest tightness).   Yes [provider]  amLODipine (NORVASC) 5 MG tablet Take 5 mg by mouth every morning.    Yes [provider]  ARIPiprazole (ABILIFY) 5 MG tablet Take 5 mg by mouth every evening.  03/09/12  Yes Erin Sons D, MD  aspirin EC 81 MG tablet Take 81 mg by mouth every evening.   Yes [provider]  atorvastatin (LIPITOR) 10 MG tablet Take 10 mg by mouth daily. 04/09/15  Yes [provider]  beclomethasone (QVAR) 80 MCG/ACT inhaler Inhale 1 puff into the lungs daily as needed.   Yes [provider]  Bromocriptine Mesylate (CYCLOSET) 0.8 MG TABS Take 2 tablets (1.6 mg total) by mouth daily. 12/20/16  Yes Renato Shin, MD  buPROPion (WELLBUTRIN XL)  150 MG 24 hr tablet Take 450 mg by mouth every morning.    Yes [provider]  CALCIUM-VITAMIN D PO Take 1 tablet by mouth daily.   Yes [provider]  FOLATE-B12-INTRINSIC FACTOR PO Take 800 mcg by mouth daily.   Yes [provider]  furosemide (LASIX) 20 MG tablet Take 20 mg by mouth daily as needed for edema.    Yes Leighton Ruff, MD  gabapentin (NEURONTIN) 300 MG capsule Take 300 mg by mouth 2 (two) times daily.    Yes [provider]  loratadine (CLARITIN) 10 MG tablet Take 10 mg by mouth daily.   Yes [provider]  LORazepam (ATIVAN) 1 MG tablet Take 1 mg by mouth 4 (four) times daily.   Yes Ricard Dillon, MD  losartan-hydrochlorothiazide [facility]) 100-12.5 MG tablet Take 1 tablet by mouth daily.   Yes [provider]  metFORMIN (GLUCOPHAGE-XR) 500 MG 24 hr tablet Take 2 tablets (1,000 mg total) by mouth daily with breakfast. 06/10/16  Yes Renato Shin, MD  metoprolol tartrate (LOPRESSOR) 25 MG tablet Take 0.5 tablets (12.5 mg total) by mouth every morning. Patient taking differently: Take 12.5 mg by mouth 2 (two) times daily.  06/18/15  Yes Jettie Booze, MD  pantoprazole (PROTONIX) 40 MG tablet Take 40 mg by mouth 2 (two) times daily.    Yes [provider]  rOPINIRole (REQUIP) 2 MG tablet Take 2 mg by mouth 2 (two) times daily. 03/16/15  Yes [provider]  TRADJENTA 5 MG TABS tablet TAKE 1/2 TABLET BY MOUTH EVERYDAY 12/02/16  Yes Renato Shin, MD  valACYclovir (VALTREX) 1000 MG tablet Take 500 mg by mouth daily.    Yes [provider]  vitamin C (ASCORBIC ACID) 500 MG tablet Take 500 mg by mouth daily.   Yes [provider]  Vitamin D, Ergocalciferol, 2000 units CAPS Take 1 capsule by mouth daily. 2000 units daily.   Yes [provider]  vortioxetine HBr (TRINTELLIX) 20 MG TABS Take 20 mg by mouth daily.   Yes [provider]  zolpidem (AMBIEN) 5 MG tablet Take  5 mg by mouth at bedtime.    Yes [provider]      Allergies  Allergen Reactions  . Fluticasone-Salmeterol Anaphylaxis  . Codeine Nausea And Vomiting  . Fetzima [Levomilnacipran] Nausea And Vomiting  . Nsaids Other (See Comments)    Upset stomach   . Trulicity [Dulaglutide] Nausea And Vomiting    Significant and undesirably rapid (40 lbs) weight loss   . Xanax [Alprazolam] Other (See Comments)    disoriented  . Amoxicillin Rash    Has patient had a PCN reaction causing immediate rash, facial/tongue/throat  swelling, SOB or lightheadedness with hypotension: Yes Has patient had a PCN reaction causing severe rash involving mucus membranes or skin necrosis: No Has patient had a PCN reaction that required hospitalization: No Has patient had a PCN reaction occurring within the last 10 years: No If all of the above answers are "NO", then may proceed with Cephalosporin use.   . Pseudoephedrine Hcl Er Palpitations    ROS:  Out of a complete 14 system review of symptoms, the patient complains only of the following symptoms, and all other reviewed systems are negative.  Weight gain, fatigue Hearing loss, ringing in the ears Shortness of breath, cough Constipation Anemia Feeling hot Joint pain Weakness, dizziness, tremor Depression, anxiety Restless legs  Blood pressure 135/75, pulse 71, height 5\' 6"  (1.676 m), weight 222 lb 8 oz (100.9 kg), SpO2 98 %.  Physical Exam  General: The patient is alert and cooperative at the time of the examination.  The patient is moderately to markedly obese.  Eyes: Pupils are equal, round, and reactive to light. Discs are flat bilaterally.  Neck: The neck is supple, no carotid bruits are noted.  Respiratory: The respiratory examination is clear.  Cardiovascular: The cardiovascular examination reveals a regular rate and rhythm, no obvious murmurs or rubs are noted.  Skin: Extremities are without significant edema.  Neurologic  Exam  Mental status: The patient is alert and oriented x 3 at the time of the examination. The patient has apparent normal recent and remote memory, with an apparently normal attention span and concentration ability.  Cranial nerves: Facial symmetry is present. There is good sensation of the face to pinprick and soft touch bilaterally. The strength of the facial muscles and the muscles to head turning and shoulder shrug are normal bilaterally. Speech is well enunciated, no aphasia or dysarthria is noted. Extraocular movements are full. Visual fields are full. The tongue is midline, and the patient has symmetric elevation of the soft palate. No obvious hearing deficits are noted.  Mild masking of the face is seen.  A head nod tremor is noted.  Motor: The motor testing reveals 5 over 5 strength of all 4 extremities. Good symmetric motor tone is noted throughout.  Sensory: Sensory testing is intact to pinprick, soft touch, vibration sensation, and position sense on all 4 extremities. No evidence of extinction is noted.  Coordination: Cerebellar testing reveals good finger-nose-finger and heel-to-shin bilaterally.  Resting tremors are seen with the right upper and right lower extremities.  Gait and station: Gait is normal. Tandem gait is unsteady. Romberg is negative. No drift is seen.  Reflexes: Deep tendon reflexes are symmetric and normal bilaterally. Toes are downgoing bilaterally.   Assessment/Plan:  1.  Parkinsonism, right-sided features  2.  History of cerebrovascular disease  The patient has been on Abilify for several years, she is now developing parkinsonism with right-sided features.  The patient may have true Parkinson's disease, but this diagnosis cannot be confirmed while the patient is on Abilify.  If possible, the patient should be taken off of Abilify, other medications such as Seroquel or Nuplazid could be used as a substitute.  The patient indicates that she has been on Seroquel  in the past, she is not sure why she went off the medication.  The patient will undergo MRI of the brain as she does have risk factors for cerebrovascular disease.  She will follow-up in 5 months.  So far, the tremors do not affect her abilities to perform her activities of daily  living.  Deanna Alexanders MD 03/02/2017 8:38 AM  [facility] [address] [place], [place] [address]  Phone [phone] Fax [phone]

## 2017-03-02 NOTE — Patient Instructions (Signed)
   WE will get MRI of the brain.

## 2017-03-08 DIAGNOSIS — F251 Schizoaffective disorder, depressive type: Secondary | ICD-10-CM | POA: Diagnosis not present

## 2017-03-14 MED FILL — CYCLOSET 0.8 MG TABLET: 0.8 | 30 days supply | Qty: 60 | Fill #3

## 2017-03-15 ENCOUNTER — Ambulatory Visit
Admission: RE | Admit: 2017-03-15 | Discharge: 2017-03-15 | Disposition: A | Discharge status: Home / Self Care | Payer: 59 | Source: Ambulatory Visit | Attending: Neurology | Admitting: Neurology

## 2017-03-15 DIAGNOSIS — G20 Parkinson's disease: Secondary | ICD-10-CM

## 2017-03-15 DIAGNOSIS — G2111 Neuroleptic induced parkinsonism: Secondary | ICD-10-CM | POA: Diagnosis not present

## 2017-03-16 ENCOUNTER — Telehealth: Payer: Self-pay | Admitting: Neurology

## 2017-03-16 MED FILL — rOPINIRole HCL 2 MG TABS: 2 | 90 days supply | Qty: 180 | Fill #0

## 2017-03-16 NOTE — Telephone Encounter (Signed)
I called the patient.  MRI of the brain shows a mild to moderate level of small vessel ischemic changes, unchanged from 2016.  The patient has been seen by Dr. Reece Levy, they have cut the Abilify dose in half.  We will follow the patient over time, she may be developing true Parkinson's disease.   MRI brain 03/15/17:  IMPRESSION: Slightly abnormal MRI scan of the brain showing prominent changes of chronic microvascular ischemia.  No acute abnormalities noted.  Overall no significant change compared with prior MRI scan dated 05/23/2014.

## 2017-03-19 DIAGNOSIS — J0100 Acute maxillary sinusitis, unspecified: Secondary | ICD-10-CM | POA: Diagnosis not present

## 2017-03-19 DIAGNOSIS — J069 Acute upper respiratory infection, unspecified: Secondary | ICD-10-CM | POA: Diagnosis not present

## 2017-03-20 ENCOUNTER — Telehealth: Payer: Self-pay | Admitting: Oncology

## 2017-03-20 NOTE — Telephone Encounter (Signed)
Spoke with patient regarding appointment.  She is aware of location/ DATE/TIME

## 2017-03-23 MED FILL — buPROPion HCL ER (XL) 150 M: 150 | 90 days supply | Qty: 270 | Fill #0

## 2017-04-05 MED FILL — TRADJENTA 5 MG TABLET: 5 | 60 days supply | Qty: 30 | Fill #2

## 2017-04-05 MED FILL — PANTOPRAZOLE SOD DR 40 MG T: 40 | 90 days supply | Qty: 180 | Fill #3

## 2017-04-13 MED FILL — VALACYCLOVIR HCL 500 MG TAB: 500 | 90 days supply | Qty: 90 | Fill #1

## 2017-04-13 MED FILL — TRINTELLIX 20 MG TABLET: 20 | 90 days supply | Qty: 90 | Fill #0

## 2017-04-14 MED FILL — ACCU-CHEK GUIDE TEST STRIP: 34 days supply | Qty: 100 | Fill #1

## 2017-04-14 MED FILL — ACCU-CHEK FASTCLIX LANCETS: 34 days supply | Qty: 102 | Fill #2

## 2017-04-17 DIAGNOSIS — N183 Chronic kidney disease, stage 3 (moderate): Secondary | ICD-10-CM | POA: Diagnosis not present

## 2017-04-18 ENCOUNTER — Telehealth: Payer: Self-pay | Admitting: Oncology

## 2017-04-18 ENCOUNTER — Inpatient Hospital Stay: Payer: 59 | Attending: Oncology | Admitting: Oncology

## 2017-04-18 VITALS — BP 151/73 | HR 71 | Temp 98.3°F | Resp 20 | Ht 66.0 in | Wt 222.3 lb

## 2017-04-18 DIAGNOSIS — R079 Chest pain, unspecified: Secondary | ICD-10-CM | POA: Diagnosis not present

## 2017-04-18 DIAGNOSIS — R32 Unspecified urinary incontinence: Secondary | ICD-10-CM | POA: Insufficient documentation

## 2017-04-18 DIAGNOSIS — N2889 Other specified disorders of kidney and ureter: Secondary | ICD-10-CM | POA: Diagnosis not present

## 2017-04-18 DIAGNOSIS — Z7982 Long term (current) use of aspirin: Secondary | ICD-10-CM | POA: Insufficient documentation

## 2017-04-18 DIAGNOSIS — Z8673 Personal history of transient ischemic attack (TIA), and cerebral infarction without residual deficits: Secondary | ICD-10-CM | POA: Diagnosis not present

## 2017-04-18 DIAGNOSIS — I10 Essential (primary) hypertension: Secondary | ICD-10-CM | POA: Diagnosis not present

## 2017-04-18 DIAGNOSIS — M129 Arthropathy, unspecified: Secondary | ICD-10-CM | POA: Insufficient documentation

## 2017-04-18 DIAGNOSIS — D472 Monoclonal gammopathy: Secondary | ICD-10-CM | POA: Insufficient documentation

## 2017-04-18 DIAGNOSIS — G20 Parkinson's disease: Secondary | ICD-10-CM | POA: Insufficient documentation

## 2017-04-18 DIAGNOSIS — F419 Anxiety disorder, unspecified: Secondary | ICD-10-CM | POA: Insufficient documentation

## 2017-04-18 DIAGNOSIS — E669 Obesity, unspecified: Secondary | ICD-10-CM | POA: Insufficient documentation

## 2017-04-18 DIAGNOSIS — G4700 Insomnia, unspecified: Secondary | ICD-10-CM | POA: Insufficient documentation

## 2017-04-18 DIAGNOSIS — E119 Type 2 diabetes mellitus without complications: Secondary | ICD-10-CM | POA: Insufficient documentation

## 2017-04-18 DIAGNOSIS — I70268 Atherosclerosis of native arteries of extremities with gangrene, other extremity: Secondary | ICD-10-CM | POA: Insufficient documentation

## 2017-04-18 DIAGNOSIS — Z79899 Other long term (current) drug therapy: Secondary | ICD-10-CM | POA: Diagnosis not present

## 2017-04-18 DIAGNOSIS — D649 Anemia, unspecified: Secondary | ICD-10-CM | POA: Insufficient documentation

## 2017-04-18 DIAGNOSIS — F329 Major depressive disorder, single episode, unspecified: Secondary | ICD-10-CM | POA: Insufficient documentation

## 2017-04-18 DIAGNOSIS — K219 Gastro-esophageal reflux disease without esophagitis: Secondary | ICD-10-CM | POA: Diagnosis not present

## 2017-04-18 MED FILL — CYCLOSET 0.8 MG TABLET: 0.8 | 30 days supply | Qty: 60 | Fill #4

## 2017-04-18 MED FILL — METOPROLOL TARTRATE 25 MG T: 25 | 90 days supply | Qty: 90 | Fill #2

## 2017-04-18 NOTE — Progress Notes (Signed)
Reason for Referral: Monoclonal gammopathy and evaluation for plasma cell disorder.  HPI: 62 year old woman currently of [place] where she lived the majority of her life.  She has history of diabetes, hypertension as well as chronic renal insufficiency.  She was found to have microcytosis and was evaluated by Dr. Irene Limbo in October 2018.  It was felt to be medication related at that time.  She was evaluated by Dr. Posey Pronto for her chronic renal insufficiency and his workup at that time revealed a abnormal serum protein electrophoresis with an SPEP of 0.2 g/dL.  She had normal free light chains with a normal ratio.  Her labs showed a hemoglobin of 11.4, MCV of 92 and RDW of 13.6.  Platelets are normal at 253.  Her differential was all within normal range.  Her creatinine was 1.69 with normal electrolytes and calcium of 9.7.  Iron studies all within normal range.  Clinically she is asymptomatic from these findings.  She does report periodic chest pain but no shortness of breath or dyspnea on exertion.  She does not report any pathological fractures or bone pain.  She denied any opportunistic infection or peripheral neuropathy.  She does not report any headaches, blurry vision, syncope or seizures.  She does not report any fevers, chills, sweats or weight loss.  She does not report any chest pain, palpitation or orthopnea.  She does not report any cough, wheezing or hemoptysis.  She does not report any nausea, vomiting or abdominal pain.  She does not report any frequency urgency or hesitancy.  She does not report any bone pain, joint swelling or arthralgias.  She does not report any heat or cold intolerance.  Remaining review of systems unremarkable.  Past Medical History:  Diagnosis Date  . Anxiety   . Arthritis    L HIP  . Avascular necrosis of hip (HCC)    LEFT  . Depression   . Diabetes mellitus   . Diabetes mellitus, type II ([place])   . Dry cough   . GERD (gastroesophageal reflux disease)   .  Hypertension   . Insomnia    takes Ambien nightly  . Kidney disease   . Obesity   . Parkinsonism ([place]) 03/02/2017  . PONV (postoperative nausea and vomiting)   . TIA (transient ischemic attack) 2012   no residual problems  . Urgency incontinence   :  Past Surgical History:  Procedure Laterality Date  . DILATION AND CURETTAGE OF UTERUS    . ESOPHAGEAL MANOMETRY N/A 09/03/2012   Procedure: ESOPHAGEAL MANOMETRY (EM);  Surgeon: Garlan Fair, MD;  Location: [facility];  Service: Endoscopy;  Laterality: N/A;  . EXPLORATORY LAPAROTOMY    . JOINT REPLACEMENT  2011   rt total hip  . PILONIDAL CYST / SINUS EXCISION    . TOTAL HIP ARTHROPLASTY Left 10/09/2013   Procedure: LEFT TOTAL HIP ARTHROPLASTY ANTERIOR APPROACH;  Surgeon: [name], MD;  Location: [facility];  Service: Orthopedics;  Laterality: Left;  :   Current Outpatient Medications:  .  ACCU-CHEK FASTCLIX LANCETS MISC, USE TO CHECK BLOOD SUGAR 3 TIMES A DAY, Disp: 102 each, Rfl: 2 .  ACCU-CHEK GUIDE test strip, USE TO CHECK BLOOD SUGAR 3 TIMES PER DAY., Disp: 100 each, Rfl: 2 .  albuterol (PROVENTIL HFA;VENTOLIN HFA) 108 (90 BASE) MCG/ACT inhaler, Inhale 2 puffs into the lungs as needed for wheezing or shortness of breath (every 4 hours as needed for cough, wheezing and chest tightness)., Disp: , Rfl:  .  amLODipine (NORVASC) 5 MG tablet, Take 5 mg by mouth every morning. , Disp: , Rfl:  .  ARIPiprazole (ABILIFY) 5 MG tablet, Take 5 mg by mouth every evening. , Disp: , Rfl:  .  aspirin EC 81 MG tablet, Take 81 mg by mouth every evening., Disp: , Rfl:  .  atorvastatin (LIPITOR) 10 MG tablet, Take 10 mg by mouth daily., Disp: , Rfl: 2 .  beclomethasone (QVAR) 80 MCG/ACT inhaler, Inhale 1 puff into the lungs daily as needed., Disp: , Rfl:  .  Bromocriptine Mesylate (CYCLOSET) 0.8 MG TABS, Take 2 tablets (1.6 mg total) by mouth daily., Disp: 180 tablet, Rfl: 3 .  buPROPion (WELLBUTRIN XL) 150 MG 24 hr tablet, Take 450 mg by mouth  every morning. , Disp: , Rfl:  .  CALCIUM-VITAMIN D PO, Take 1 tablet by mouth daily., Disp: , Rfl:  .  FOLATE-B12-INTRINSIC FACTOR PO, Take 800 mcg by mouth daily., Disp: , Rfl:  .  furosemide (LASIX) 20 MG tablet, Take 20 mg by mouth daily as needed for edema. , Disp: , Rfl:  .  gabapentin (NEURONTIN) 300 MG capsule, Take 300 mg by mouth 2 (two) times daily. , Disp: , Rfl:  .  loratadine (CLARITIN) 10 MG tablet, Take 10 mg by mouth daily., Disp: , Rfl:  .  LORazepam (ATIVAN) 1 MG tablet, Take 1 mg by mouth 4 (four) times daily., Disp: , Rfl:  .  losartan-hydrochlorothiazide (HYZAAR) 100-12.5 MG tablet, Take 1 tablet by mouth daily., Disp: , Rfl:  .  metFORMIN (GLUCOPHAGE-XR) 500 MG 24 hr tablet, Take 2 tablets (1,000 mg total) by mouth daily with breakfast., Disp: 180 tablet, Rfl: 3 .  metoprolol tartrate (LOPRESSOR) 25 MG tablet, Take 0.5 tablets (12.5 mg total) by mouth every morning. (Patient taking differently: Take 12.5 mg by mouth 2 (two) times daily. ), Disp: 90 tablet, Rfl: 3 .  pantoprazole (PROTONIX) 40 MG tablet, Take 40 mg by mouth 2 (two) times daily. , Disp: , Rfl:  .  rOPINIRole (REQUIP) 2 MG tablet, Take 2 mg by mouth 2 (two) times daily., Disp: , Rfl: 3 .  TRADJENTA 5 MG TABS tablet, TAKE 1/2 TABLET BY MOUTH EVERYDAY, Disp: 30 tablet, Rfl: 5 .  valACYclovir (VALTREX) 1000 MG tablet, Take 500 mg by mouth daily. , Disp: , Rfl:  .  vitamin C (ASCORBIC ACID) 500 MG tablet, Take 500 mg by mouth daily., Disp: , Rfl:  .  Vitamin D, Ergocalciferol, 2000 units CAPS, Take 1 capsule by mouth daily. 2000 units daily., Disp: , Rfl:  .  vortioxetine HBr (TRINTELLIX) 20 MG TABS, Take 20 mg by mouth daily., Disp: , Rfl:  .  zolpidem (AMBIEN) 5 MG tablet, Take 5 mg by mouth at bedtime. , Disp: , Rfl:  No current facility-administered medications for this visit.   Facility-Administered Medications Ordered in Other Visits:  .  tranexamic acid (CYKLOKAPRON) topical -INTRAOP, 2,000 mg, Topical,  Once, [place], [employer], PA-C:  Allergies  Allergen Reactions  . Fluticasone-Salmeterol Anaphylaxis  . Codeine Nausea And Vomiting  . Fetzima [Levomilnacipran] Nausea And Vomiting  . Nsaids Other (See Comments)    Upset stomach   . Trulicity [Dulaglutide] Nausea And Vomiting    Significant and undesirably rapid (40 lbs) weight loss   . Xanax [Alprazolam] Other (See Comments)    disoriented  . Amoxicillin Rash    Has patient had a PCN reaction causing immediate rash, facial/tongue/throat swelling, SOB or lightheadedness with hypotension: Yes Has patient had  a PCN reaction causing severe rash involving mucus membranes or skin necrosis: No Has patient had a PCN reaction that required hospitalization: No Has patient had a PCN reaction occurring within the last 10 years: No If all of the above answers are "NO", then may proceed with Cephalosporin use.   . Pseudoephedrine Hcl Er Palpitations  :  Family History  Problem Relation Age of Onset  . Alcohol abuse Brother   . Alcohol abuse Brother   . Alcohol abuse Brother   . Alcohol abuse Brother   . Diabetes Neg Hx   :  Social History   Socioeconomic History  . Marital status: Married    Spouse name: Not on file  . Number of children: Not on file  . Years of education: Not on file  . Highest education level: Not on file  Social Needs  . Financial resource strain: Not on file  . Food insecurity - worry: Not on file  . Food insecurity - inability: Not on file  . Transportation needs - medical: Not on file  . Transportation needs - non-medical: Not on file  Occupational History  . Not on file  Tobacco Use  . Smoking status: Never Smoker  . Smokeless tobacco: Never Used  Substance and Sexual Activity  . Alcohol use: No    Alcohol/week: 0.0 oz  . Drug use: No  . Sexual activity: Not Currently  Other Topics Concern  . Not on file  Social History Narrative   Lives with husband   Caffeine use: none   Right handed    :  Pertinent items are noted in HPI.  Exam: Blood pressure (!) 151/73, pulse 71, temperature 98.3 F (36.8 C), temperature source Oral, resp. rate 20, height 5' 6"  (1.676 m), weight 222 lb 4.8 oz (100.8 kg), SpO2 99 %. General appearance: alert, cooperative and appears stated age Head: Normocephalic atraumatic. Eyes: No lateral icterus.  Pupils are equal and round reactive to light. Throat: lips, mucosa, and tongue normal; teeth and gums normal Resp: clear to auscultation bilaterally Cardio: regular rate and rhythm, S1, S2 normal, no murmur, click, rub or gallop GI: soft, non-tender; bowel sounds normal; no masses,  no organomegaly Pulses: 2+ and symmetric Lymph nodes: Cervical, supraclavicular, and axillary nodes normal. Neurologic: Grossly normal Skin: No rashes or lesions.  CBC    Component Value Date/Time   WBC 5.5 01/12/2017 0913   WBC 6.6 04/16/2015 1158   RBC 3.49 (L) 01/12/2017 0913   RBC 4.07 04/16/2015 1158   HGB 11.4 (L) 01/12/2017 0913   HCT 35.2 01/12/2017 0913   PLT 228 01/12/2017 0913   MCV 100.9 01/12/2017 0913   MCH 32.7 01/12/2017 0913   MCH 32.2 04/16/2015 1158   MCHC 32.4 01/12/2017 0913   MCHC 33.6 04/16/2015 1158   RDW 14.1 01/12/2017 0913   LYMPHSABS 2.3 01/12/2017 0913   MONOABS 0.5 01/12/2017 0913   EOSABS 0.1 01/12/2017 0913   BASOSABS 0.0 01/12/2017 0913     Chemistry      Component Value Date/Time   NA 142 01/12/2017 0914   K 4.2 01/12/2017 0914   CL 102 04/16/2015 1158   CO2 28 01/12/2017 0914   BUN 12.0 01/12/2017 0914   CREATININE 1.3 (H) 01/12/2017 0914      Component Value Date/Time   CALCIUM 9.6 01/12/2017 0914   ALKPHOS 43 01/12/2017 0914   AST 19 01/12/2017 0914   ALT 18 01/12/2017 0914   BILITOT 0.28 01/12/2017 0914  Assessment and Plan:    62 year old woman with the following issues:  1.  Abnormal serum protein electrophoresis with an M spike of 0.2 g/dL detected in December 2018.  Her serum light chains all  within normal range without any quantitative immunoglobulins.  This is in the setting of renal insufficiency and mild anemia.  Differential diagnosis was discussed today with the patient and her husband.  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  Symptomatic multiple myeloma, amyloidosis are considered unlikely.  I recommend at this point continued observation and surveillance and repeat the studies in 6 months.  Bone marrow biopsy and a skeletal survey may be needed if any abnormalities in her protein studies persist or worsen.  2.  Renal insufficiency: Unrelated to a plasma cell disorder.  This is related to long-standing hypertension and diabetes.  3.  Mild anemia: Related to renal insufficiency and chronic disease.  Her hemoglobin is close to normal range and I do not think plasma cell disorder is contributing.  30 minutes was spent face-to-face today with the patient.  More than 50% was spent on education, coordination of care and counseling about her laboratory findings and plan of care.

## 2017-04-18 NOTE — Telephone Encounter (Signed)
Scheduled appt per 1/15 los - Gave patient AVS and calender per los.  

## 2017-04-21 DIAGNOSIS — F339 Major depressive disorder, recurrent, unspecified: Secondary | ICD-10-CM | POA: Diagnosis not present

## 2017-04-21 DIAGNOSIS — N183 Chronic kidney disease, stage 3 (moderate): Secondary | ICD-10-CM | POA: Diagnosis not present

## 2017-04-21 DIAGNOSIS — R1310 Dysphagia, unspecified: Secondary | ICD-10-CM | POA: Diagnosis not present

## 2017-04-21 DIAGNOSIS — E041 Nontoxic single thyroid nodule: Secondary | ICD-10-CM | POA: Diagnosis not present

## 2017-04-24 DIAGNOSIS — N2581 Secondary hyperparathyroidism of renal origin: Secondary | ICD-10-CM | POA: Diagnosis not present

## 2017-04-24 DIAGNOSIS — N183 Chronic kidney disease, stage 3 (moderate): Secondary | ICD-10-CM | POA: Diagnosis not present

## 2017-04-24 DIAGNOSIS — I129 Hypertensive chronic kidney disease with stage 1 through stage 4 chronic kidney disease, or unspecified chronic kidney disease: Secondary | ICD-10-CM | POA: Diagnosis not present

## 2017-04-24 DIAGNOSIS — D631 Anemia in chronic kidney disease: Secondary | ICD-10-CM | POA: Diagnosis not present

## 2017-04-25 MED FILL — LOSARTAN-HCTZ 100-12.5 MG T: 100-12.5 | 90 days supply | Qty: 90 | Fill #0

## 2017-04-28 DIAGNOSIS — R1310 Dysphagia, unspecified: Secondary | ICD-10-CM | POA: Diagnosis not present

## 2017-05-02 DIAGNOSIS — E042 Nontoxic multinodular goiter: Secondary | ICD-10-CM | POA: Diagnosis not present

## 2017-05-02 DIAGNOSIS — R61 Generalized hyperhidrosis: Secondary | ICD-10-CM | POA: Diagnosis not present

## 2017-05-02 DIAGNOSIS — E119 Type 2 diabetes mellitus without complications: Secondary | ICD-10-CM | POA: Diagnosis not present

## 2017-05-02 DIAGNOSIS — M542 Cervicalgia: Secondary | ICD-10-CM | POA: Diagnosis not present

## 2017-05-02 DIAGNOSIS — N183 Chronic kidney disease, stage 3 (moderate): Secondary | ICD-10-CM | POA: Diagnosis not present

## 2017-05-03 DIAGNOSIS — R1310 Dysphagia, unspecified: Secondary | ICD-10-CM | POA: Diagnosis not present

## 2017-05-15 MED FILL — GABAPENTIN 300 MG CAPSULE: 300 | 90 days supply | Qty: 180 | Fill #1

## 2017-05-15 MED FILL — AMLODIPINE BESYLATE 5 MG TA: 5 | 90 days supply | Qty: 90 | Fill #0

## 2017-05-16 MED FILL — CYCLOSET 0.8 MG TABLET: 0.8 | 30 days supply | Qty: 90 | Fill #0

## 2017-05-17 DIAGNOSIS — R748 Abnormal levels of other serum enzymes: Secondary | ICD-10-CM | POA: Diagnosis not present

## 2017-05-17 DIAGNOSIS — R700 Elevated erythrocyte sedimentation rate: Secondary | ICD-10-CM | POA: Diagnosis not present

## 2017-05-17 DIAGNOSIS — N183 Chronic kidney disease, stage 3 (moderate): Secondary | ICD-10-CM | POA: Diagnosis not present

## 2017-05-24 ENCOUNTER — Other Ambulatory Visit: Payer: Self-pay | Admitting: Gastroenterology

## 2017-05-24 ENCOUNTER — Other Ambulatory Visit: Payer: Self-pay | Admitting: Endocrinology

## 2017-05-24 DIAGNOSIS — K219 Gastro-esophageal reflux disease without esophagitis: Secondary | ICD-10-CM

## 2017-05-24 MED FILL — TRADJENTA 5 MG TABLET: 5 | 60 days supply | Qty: 30 | Fill #3

## 2017-05-24 MED FILL — ACCU-CHEK GUIDE TEST STRIP: 34 days supply | Qty: 100 | Fill #2

## 2017-05-25 MED FILL — ACCU-CHEK FASTCLIX LANCETS: 34 days supply | Qty: 102 | Fill #0

## 2017-05-26 ENCOUNTER — Ambulatory Visit
Admission: RE | Admit: 2017-05-26 | Discharge: 2017-05-26 | Disposition: A | Discharge status: Home / Self Care | Payer: 59 | Source: Ambulatory Visit | Attending: Gastroenterology | Admitting: Gastroenterology

## 2017-05-26 DIAGNOSIS — K219 Gastro-esophageal reflux disease without esophagitis: Secondary | ICD-10-CM

## 2017-05-26 DIAGNOSIS — K210 Gastro-esophageal reflux disease with esophagitis: Secondary | ICD-10-CM | POA: Diagnosis not present

## 2017-05-29 ENCOUNTER — Ambulatory Visit: Payer: Self-pay | Admitting: Endocrinology

## 2017-06-02 ENCOUNTER — Other Ambulatory Visit: Payer: Self-pay | Admitting: Gastroenterology

## 2017-06-06 MED FILL — LORazepam 1 MG TABS: 1 | 90 days supply | Qty: 360 | Fill #0

## 2017-06-06 MED FILL — ZOLPIDEM TARTRATE 5 MG TABL: 5 | 90 days supply | Qty: 90 | Fill #0

## 2017-06-07 MED FILL — rOPINIRole HCL 2 MG TABS: 2 | 90 days supply | Qty: 180 | Fill #0

## 2017-06-09 MED FILL — CYCLOSET 0.8 MG TABLET: 0.8 | 30 days supply | Qty: 90 | Fill #1

## 2017-06-11 DIAGNOSIS — J069 Acute upper respiratory infection, unspecified: Secondary | ICD-10-CM | POA: Diagnosis not present

## 2017-06-14 ENCOUNTER — Other Ambulatory Visit: Payer: Self-pay | Admitting: Gastroenterology

## 2017-06-16 MED FILL — buPROPion HCL ER (XL) 150 M: 150 | 90 days supply | Qty: 270 | Fill #0

## 2017-06-19 ENCOUNTER — Encounter (HOSPITAL_COMMUNITY)
Admission: RE | Disposition: A | Discharge status: Home / Self Care | Payer: Self-pay | Source: Ambulatory Visit | Attending: Gastroenterology

## 2017-06-19 ENCOUNTER — Ambulatory Visit (HOSPITAL_COMMUNITY)
Admission: RE | Admit: 2017-06-19 | Discharge: 2017-06-19 | Disposition: A | Discharge status: Home / Self Care | Payer: 59 | Source: Ambulatory Visit | Attending: Gastroenterology | Admitting: Gastroenterology

## 2017-06-19 DIAGNOSIS — R1310 Dysphagia, unspecified: Secondary | ICD-10-CM | POA: Insufficient documentation

## 2017-06-19 HISTORY — PX: ESOPHAGEAL MANOMETRY: SHX5429

## 2017-06-19 SURGERY — MANOMETRY, ESOPHAGUS

## 2017-06-19 MED ORDER — LIDOCAINE VISCOUS 2 % MT SOLN
OROMUCOSAL | Status: AC
  Filled 2017-06-19: qty 15

## 2017-06-19 MED FILL — REPAGLINIDE 1 MG TABLET: 1 | 90 days supply | Qty: 180 | Fill #0

## 2017-06-19 SURGICAL SUPPLY — 2 items
FACESHIELD LNG OPTICON STERILE (SAFETY) IMPLANT
GLOVE BIO SURGEON STRL SZ8 (GLOVE) ×4 IMPLANT

## 2017-06-19 NOTE — Progress Notes (Signed)
Esophageal Manometry done per protocol. Pt tolerated well and without complication. Report to be read by Dr. Michail Sermon.   Prior to test I had called to [facility] to clarify regarding Manometry done 09/2012 to make sure they knew about prior study. Talked with Deah and she checked and said they wanted to see what has changed since past manometry.

## 2017-06-20 ENCOUNTER — Encounter (HOSPITAL_COMMUNITY): Payer: Self-pay | Admitting: Gastroenterology

## 2017-07-01 DIAGNOSIS — R1310 Dysphagia, unspecified: Secondary | ICD-10-CM | POA: Diagnosis not present

## 2017-07-04 MED FILL — VALACYCLOVIR HCL 500 MG TAB: 500 | 90 days supply | Qty: 90 | Fill #2

## 2017-07-12 MED FILL — ACCU-CHEK FASTCLIX LANCETS: 34 days supply | Qty: 102 | Fill #1

## 2017-07-12 MED FILL — PANTOPRAZOLE SOD DR 40 MG T: 40 | 45 days supply | Qty: 90 | Fill #0

## 2017-07-13 MED FILL — TRINTELLIX 20 MG TABLET: 20 | 90 days supply | Qty: 90 | Fill #0

## 2017-07-14 DIAGNOSIS — R1310 Dysphagia, unspecified: Secondary | ICD-10-CM | POA: Diagnosis not present

## 2017-07-14 MED FILL — ACCU-CHEK GUIDE STRP: 90 days supply | Qty: 300 | Fill #0

## 2017-07-14 MED FILL — OSCIMIN SL 0.125 MG TABLET: 0.125 | 5 days supply | Qty: 50 | Fill #0

## 2017-07-26 MED FILL — METOPROLOL TARTRATE 25 MG T: 25 | 90 days supply | Qty: 90 | Fill #3

## 2017-07-26 MED FILL — LOSARTAN-HCTZ 100-12.5 MG T: 100-12.5 | 90 days supply | Qty: 90 | Fill #0

## 2017-07-28 DIAGNOSIS — N183 Chronic kidney disease, stage 3 (moderate): Secondary | ICD-10-CM | POA: Diagnosis not present

## 2017-07-28 DIAGNOSIS — E119 Type 2 diabetes mellitus without complications: Secondary | ICD-10-CM | POA: Diagnosis not present

## 2017-07-28 DIAGNOSIS — E042 Nontoxic multinodular goiter: Secondary | ICD-10-CM | POA: Diagnosis not present

## 2017-08-02 ENCOUNTER — Other Ambulatory Visit: Payer: Self-pay | Admitting: Obstetrics and Gynecology

## 2017-08-02 DIAGNOSIS — M549 Dorsalgia, unspecified: Secondary | ICD-10-CM

## 2017-08-02 DIAGNOSIS — R1030 Lower abdominal pain, unspecified: Secondary | ICD-10-CM

## 2017-08-04 DIAGNOSIS — E119 Type 2 diabetes mellitus without complications: Secondary | ICD-10-CM | POA: Diagnosis not present

## 2017-08-04 DIAGNOSIS — M25551 Pain in right hip: Secondary | ICD-10-CM | POA: Diagnosis not present

## 2017-08-04 DIAGNOSIS — M7061 Trochanteric bursitis, right hip: Secondary | ICD-10-CM | POA: Diagnosis not present

## 2017-08-08 MED FILL — REPAGLINIDE 2 MG TABLET: 2 | 30 days supply | Qty: 60 | Fill #0

## 2017-08-10 MED FILL — AMLODIPINE BESYLATE 5 MG TA: 5 | 90 days supply | Qty: 90 | Fill #0

## 2017-08-11 ENCOUNTER — Encounter: Payer: Self-pay | Admitting: Neurology

## 2017-08-11 ENCOUNTER — Ambulatory Visit (INDEPENDENT_AMBULATORY_CARE_PROVIDER_SITE_OTHER): Payer: 59 | Admitting: Neurology

## 2017-08-11 ENCOUNTER — Other Ambulatory Visit: Payer: Self-pay

## 2017-08-11 ENCOUNTER — Ambulatory Visit
Admission: RE | Admit: 2017-08-11 | Discharge: 2017-08-11 | Disposition: A | Discharge status: Home / Self Care | Payer: 59 | Source: Ambulatory Visit | Attending: Obstetrics and Gynecology | Admitting: Obstetrics and Gynecology

## 2017-08-11 VITALS — BP 134/82 | HR 63 | Resp 18 | Ht 66.0 in | Wt 218.0 lb

## 2017-08-11 DIAGNOSIS — R1030 Lower abdominal pain, unspecified: Secondary | ICD-10-CM

## 2017-08-11 DIAGNOSIS — G2111 Neuroleptic induced parkinsonism: Secondary | ICD-10-CM

## 2017-08-11 DIAGNOSIS — R109 Unspecified abdominal pain: Secondary | ICD-10-CM | POA: Diagnosis not present

## 2017-08-11 DIAGNOSIS — M549 Dorsalgia, unspecified: Secondary | ICD-10-CM

## 2017-08-11 NOTE — Progress Notes (Signed)
Reason for visit: Parkinsonism  Deanna Rowe is an 62 y.o. female  History of present illness:  Deanna Rowe is a 62 year old right-handed white female with a history of depression, she is on Abilify for this, followed by Dr. Reece Levy.  The patient has developed signs of Parkinson's disease, she has a right upper extremity tremor, head tremor, decreased arm swing on the right.  The patient has undergone MRI of the brain that shows a moderate level small vessel disease, no change from 2016.  The patient has been reduced on the Abilify dose to a 2.5 mg level, she has had improvement in her tremor and symptoms of parkinsonism but she still does have some issues.  She has an internal sensation of tremor as well.  The patient has had one fall since last seen when she was rushing to get somewhere, otherwise her balance has been good.  Her mobility is excellent.  She returns to this office for an evaluation.   Past Medical History:  Diagnosis Date  . Anxiety   . Arthritis    L HIP  . Avascular necrosis of hip (HCC)    LEFT  . Depression   . Diabetes mellitus   . Diabetes mellitus, type II ([place])   . Dry cough   . GERD (gastroesophageal reflux disease)   . Hypertension   . Insomnia    takes Ambien nightly  . Kidney disease   . Obesity   . Parkinsonism ([place]) 03/02/2017  . PONV (postoperative nausea and vomiting)   . TIA (transient ischemic attack) 2012   no residual problems  . Urgency incontinence     Past Surgical History:  Procedure Laterality Date  . DILATION AND CURETTAGE OF UTERUS    . ESOPHAGEAL MANOMETRY N/A 09/03/2012   Procedure: ESOPHAGEAL MANOMETRY (EM);  Surgeon: Garlan Fair, MD;  [facility];  Service: Endoscopy;  Laterality: N/A;  . ESOPHAGEAL MANOMETRY N/A 06/19/2017   Procedure: ESOPHAGEAL MANOMETRY (EM);  Surgeon: Clarene Essex, MD;  Location: [facility];  Service: Endoscopy;  Laterality: N/A;  . EXPLORATORY LAPAROTOMY    . JOINT REPLACEMENT  2011   rt total hip  . PILONIDAL CYST / SINUS EXCISION    . TOTAL HIP ARTHROPLASTY Left 10/09/2013   Procedure: LEFT TOTAL HIP ARTHROPLASTY ANTERIOR APPROACH;  Surgeon: [name], MD;  Location: [facility];  Service: Orthopedics;  Laterality: Left;    Family History  Problem Relation Age of Onset  . Alcohol abuse Brother   . Alcohol abuse Brother   . Alcohol abuse Brother   . Alcohol abuse Brother   . Diabetes Neg Hx     Social history:  reports that she has never smoked. She has never used smokeless tobacco. She reports that she does not drink alcohol or use drugs.    Allergies  Allergen Reactions  . Fluticasone-Salmeterol Anaphylaxis  . Codeine Nausea And Vomiting  . Fetzima [Levomilnacipran] Nausea And Vomiting  . Nsaids Other (See Comments)    Upset stomach   . Trulicity [Dulaglutide] Nausea And Vomiting    Significant and undesirably rapid (40 lbs) weight loss   . Xanax [Alprazolam] Other (See Comments)    disoriented  . Amoxicillin Rash    Has patient had a PCN reaction causing immediate rash, facial/tongue/throat swelling, SOB or lightheadedness with hypotension: Yes Has patient had a PCN reaction causing severe rash involving mucus membranes or skin necrosis: No Has patient had a PCN reaction that required hospitalization: No Has  patient had a PCN reaction occurring within the last 10 years: No If all of the above answers are "NO", then may proceed with Cephalosporin use.   . Pseudoephedrine Hcl Er Palpitations    Medications:  Prior to Admission medications   Medication Sig Start Date End Date Taking? Authorizing Provider  ACCU-CHEK FASTCLIX LANCETS MISC USE TO CHECK BLOOD SUGAR 3 TIMES A DAY 05/25/17  Yes Renato Shin, MD  ACCU-CHEK GUIDE test strip USE TO CHECK BLOOD SUGAR 3 TIMES PER DAY. 02/02/17  Yes Renato Shin, MD  albuterol (PROVENTIL HFA;VENTOLIN HFA) 108 (90 BASE) MCG/ACT inhaler Inhale 2 puffs into the lungs as needed for wheezing or shortness of breath  (every 4 hours as needed for cough, wheezing and chest tightness).   Yes [provider]  amLODipine (NORVASC) 5 MG tablet Take 5 mg by mouth every morning.    Yes [provider]  ARIPiprazole (ABILIFY) 5 MG tablet Take 5 mg by mouth every evening.  03/09/12  Yes Erin Sons D, MD  aspirin EC 81 MG tablet Take 81 mg by mouth every evening.   Yes [provider]  atorvastatin (LIPITOR) 10 MG tablet Take 10 mg by mouth daily. 04/09/15  Yes [provider]  beclomethasone (QVAR) 80 MCG/ACT inhaler Inhale 1 puff into the lungs daily as needed.   Yes [provider]  buPROPion (WELLBUTRIN XL) 150 MG 24 hr tablet Take 450 mg by mouth every morning.    Yes [provider]  CALCIUM-VITAMIN D PO Take 1 tablet by mouth daily.   Yes [provider]  FOLATE-B12-INTRINSIC FACTOR PO Take 800 mcg by mouth daily.   Yes [provider]  furosemide (LASIX) 20 MG tablet Take 20 mg by mouth daily as needed for edema.    Yes Leighton Ruff, MD  gabapentin (NEURONTIN) 300 MG capsule Take 300 mg by mouth 2 (two) times daily.    Yes [provider]  LORazepam (ATIVAN) 1 MG tablet Take 1 mg by mouth 4 (four) times daily.   Yes Ricard Dillon, MD  losartan-hydrochlorothiazide [facility]) 100-12.5 MG tablet Take 1 tablet by mouth daily.   Yes [provider]  metoprolol tartrate (LOPRESSOR) 25 MG tablet Take 0.5 tablets (12.5 mg total) by mouth every morning. Patient taking differently: Take 12.5 mg by mouth 2 (two) times daily.  06/18/15  Yes Jettie Booze, MD  pantoprazole (PROTONIX) 40 MG tablet Take 40 mg by mouth 2 (two) times daily.    Yes [provider]  repaglinide (PRANDIN) 2 MG tablet Take 2 mg by mouth 2 (two) times daily before a meal.   Yes [provider]  rOPINIRole (REQUIP) 2 MG tablet Take 2 mg by mouth 2 (two) times daily. 03/16/15  Yes [provider]  valACYclovir  (VALTREX) 1000 MG tablet Take 500 mg by mouth daily.    Yes [provider]  vitamin C (ASCORBIC ACID) 500 MG tablet Take 500 mg by mouth daily.   Yes [provider]  Vitamin D, Ergocalciferol, 2000 units CAPS Take 1 capsule by mouth daily. 2000 units daily.   Yes [provider]  vortioxetine HBr (TRINTELLIX) 20 MG TABS Take 20 mg by mouth daily.   Yes [provider]  zolpidem (AMBIEN) 5 MG tablet Take 5 mg by mouth at bedtime.    Yes [provider]  Bromocriptine Mesylate (CYCLOSET) 0.8 MG TABS Take 2 tablets (1.6 mg total) by mouth daily. Patient not taking: Reported on  08/11/2017 12/20/16   Renato Shin, MD  loratadine (CLARITIN) 10 MG tablet Take 10 mg by mouth daily.    [provider]  metFORMIN (GLUCOPHAGE-XR) 500 MG 24 hr tablet Take 2 tablets (1,000 mg total) by mouth daily with breakfast. Patient not taking: Reported on 08/11/2017 06/10/16   Renato Shin, MD  TRADJENTA 5 MG TABS tablet TAKE 1/2 TABLET BY MOUTH EVERYDAY Patient not taking: Reported on 08/11/2017 12/02/16   Renato Shin, MD    ROS:  Out of a complete 14 system review of symptoms, the patient complains only of the following symptoms, and all other reviewed systems are negative.  Weight gain, excessive sweating Hearing loss, ringing in the ears, difficulty swallowing Cough, shortness of breath Swollen abdomen, constipation Restless legs Urinary urgency Joint pain, back pain Swollen lymph nodes Tremors Anxiety  Blood pressure 134/82, pulse 63, resp. rate 18, height 5\' 6"  (1.676 m), weight 218 lb (98.9 kg).  Physical Exam  General: The patient is alert and cooperative at the time of the examination.  The patient is moderately obese.  Skin: No significant peripheral edema is noted.   Neurologic Exam  Mental status: The patient is alert and oriented x 3 at the time of the examination. The patient has apparent normal recent and remote memory, with an  apparently normal attention span and concentration ability.   Cranial nerves: Facial symmetry is present. Speech is normal, no aphasia or dysarthria is noted. Extraocular movements are full. Visual fields are full.  Motor: The patient has good strength in all 4 extremities.  Sensory examination: Soft touch sensation is symmetric on the face, arms, and legs.  Coordination: The patient has good finger-nose-finger and heel-to-shin bilaterally.  Resting tremors noted with the right upper extremity.  Gait and station: The patient has the ability to arise from a seated position with arms crossed.  Once up, the patient walk independently, decreased arm swing seen on the right and a tremor is noted with walking with the right arm.  The patient has good stride and good turns.  Tandem gait is normal. Romberg is negative. No drift is seen.  Reflexes: Deep tendon reflexes are symmetric.   MRI brain 03/15/17:  IMPRESSION: Slightly abnormal MRI scan of the brain showing prominent changes of chronic microvascular ischemia. No acute abnormalities noted. Overall no significant change compared with prior MRI scan dated 05/23/2014.  * MRI scan images were reviewed online. I agree with the written report.    Assessment/Plan:  1.  Parkinsonism  The patient very well may have true Parkinson's disease.  The patient has done somewhat better on a lower dose of Abilify, but she still has primarily right-sided symptoms of Parkinson's disease.  If at all possible, I would recommend that the patient be switched to Nuplazid.  Apparently in the past she did not do well with Seroquel therapy.  The patient will follow-up in 4 or 5 months.  She is on Requip at night for restless leg syndrome.  Sinemet may need to be added in the future if her symptoms progress.  Jill Alexanders MD 08/11/2017 8:48 AM  [facility] [address] [place], [place] [address]  Phone [phone] Fax  [phone]

## 2017-08-14 DIAGNOSIS — N189 Chronic kidney disease, unspecified: Secondary | ICD-10-CM | POA: Diagnosis not present

## 2017-08-14 DIAGNOSIS — N2581 Secondary hyperparathyroidism of renal origin: Secondary | ICD-10-CM | POA: Diagnosis not present

## 2017-08-14 DIAGNOSIS — N183 Chronic kidney disease, stage 3 (moderate): Secondary | ICD-10-CM | POA: Diagnosis not present

## 2017-08-17 DIAGNOSIS — R140 Abdominal distension (gaseous): Secondary | ICD-10-CM | POA: Diagnosis not present

## 2017-08-21 DIAGNOSIS — D631 Anemia in chronic kidney disease: Secondary | ICD-10-CM | POA: Diagnosis not present

## 2017-08-21 DIAGNOSIS — I129 Hypertensive chronic kidney disease with stage 1 through stage 4 chronic kidney disease, or unspecified chronic kidney disease: Secondary | ICD-10-CM | POA: Diagnosis not present

## 2017-08-21 DIAGNOSIS — N2581 Secondary hyperparathyroidism of renal origin: Secondary | ICD-10-CM | POA: Diagnosis not present

## 2017-08-21 DIAGNOSIS — N183 Chronic kidney disease, stage 3 (moderate): Secondary | ICD-10-CM | POA: Diagnosis not present

## 2017-08-30 MED FILL — GABAPENTIN 300 MG CAPSULE: 300 | 90 days supply | Qty: 180 | Fill #0

## 2017-08-30 MED FILL — PANTOPRAZOLE SOD DR 40 MG T: 40 | 45 days supply | Qty: 90 | Fill #0

## 2017-08-30 MED FILL — ZOLPIDEM TARTRATE 5 MG TAB: 5 | 90 days supply | Qty: 90 | Fill #1

## 2017-08-30 MED FILL — ARIPiprazole 5 MG TABS: 5 | 90 days supply | Qty: 90 | Fill #0

## 2017-09-01 MED FILL — REPAGLINIDE 2 MG TABLET: 2 | 30 days supply | Qty: 120 | Fill #0

## 2017-09-05 DIAGNOSIS — R140 Abdominal distension (gaseous): Secondary | ICD-10-CM | POA: Diagnosis not present

## 2017-09-06 DIAGNOSIS — F251 Schizoaffective disorder, depressive type: Secondary | ICD-10-CM | POA: Diagnosis not present

## 2017-09-06 MED FILL — ARIPiprazole 2 MG TABS: 2 | 90 days supply | Qty: 90 | Fill #0

## 2017-09-06 MED FILL — LORazepam 1 MG TABS: 1 | 90 days supply | Qty: 360 | Fill #0

## 2017-09-11 MED FILL — traMADol HCL 50 MG TABS: 50 | 7 days supply | Qty: 30 | Fill #0

## 2017-09-11 MED FILL — rOPINIRole HCL 2 MG TABS: 2 | 90 days supply | Qty: 180 | Fill #1

## 2017-09-11 MED FILL — ACCU-CHEK FASTCLIX LANCETS: 34 days supply | Qty: 102 | Fill #2

## 2017-09-11 MED FILL — BENZONATATE 100 MG CAPSULE: 100 | 10 days supply | Qty: 30 | Fill #0

## 2017-09-12 ENCOUNTER — Other Ambulatory Visit: Payer: Self-pay | Admitting: Family Medicine

## 2017-09-12 DIAGNOSIS — R140 Abdominal distension (gaseous): Secondary | ICD-10-CM

## 2017-09-14 ENCOUNTER — Ambulatory Visit
Admission: RE | Admit: 2017-09-14 | Discharge: 2017-09-14 | Disposition: A | Discharge status: Home / Self Care | Payer: 59 | Source: Ambulatory Visit | Attending: Family Medicine | Admitting: Family Medicine

## 2017-09-14 DIAGNOSIS — K5730 Diverticulosis of large intestine without perforation or abscess without bleeding: Secondary | ICD-10-CM | POA: Diagnosis not present

## 2017-09-14 DIAGNOSIS — R140 Abdominal distension (gaseous): Secondary | ICD-10-CM

## 2017-09-14 MED ORDER — IOHEXOL 300 MG/ML  SOLN
125.0000 mL | Freq: Once | INTRAMUSCULAR | Status: AC | PRN
  Administered 2017-09-14: 125 mL via INTRAVENOUS

## 2017-09-25 MED FILL — buPROPion HCL ER (XL) 150 M: 150 | 90 days supply | Qty: 270 | Fill #1

## 2017-10-09 ENCOUNTER — Other Ambulatory Visit: Payer: Self-pay | Admitting: Cardiology

## 2017-10-09 MED FILL — VALACYCLOVIR HCL 500 MG TAB: 500 | 90 days supply | Qty: 90 | Fill #3

## 2017-10-09 MED FILL — PANTOPRAZOLE SOD DR 40 MG T: 40 | 45 days supply | Qty: 90 | Fill #0

## 2017-10-09 MED FILL — TRINTELLIX 20 MG TABLET: 20 | 90 days supply | Qty: 90 | Fill #0

## 2017-10-09 MED FILL — REPAGLINIDE 2 MG TABLET: 2 | 30 days supply | Qty: 120 | Fill #1

## 2017-10-11 ENCOUNTER — Emergency Department (HOSPITAL_BASED_OUTPATIENT_CLINIC_OR_DEPARTMENT_OTHER)
Admission: EM | Admit: 2017-10-11 | Discharge: 2017-10-11 | Disposition: A | Discharge status: Home / Self Care | Payer: 59 | Attending: Emergency Medicine | Admitting: Emergency Medicine

## 2017-10-11 ENCOUNTER — Other Ambulatory Visit: Payer: Self-pay

## 2017-10-11 ENCOUNTER — Emergency Department (HOSPITAL_BASED_OUTPATIENT_CLINIC_OR_DEPARTMENT_OTHER): Payer: 59

## 2017-10-11 ENCOUNTER — Inpatient Hospital Stay: Payer: 59 | Attending: Oncology

## 2017-10-11 ENCOUNTER — Encounter (HOSPITAL_BASED_OUTPATIENT_CLINIC_OR_DEPARTMENT_OTHER): Payer: Self-pay

## 2017-10-11 DIAGNOSIS — Z7982 Long term (current) use of aspirin: Secondary | ICD-10-CM | POA: Insufficient documentation

## 2017-10-11 DIAGNOSIS — E1140 Type 2 diabetes mellitus with diabetic neuropathy, unspecified: Secondary | ICD-10-CM | POA: Diagnosis not present

## 2017-10-11 DIAGNOSIS — Y939 Activity, unspecified: Secondary | ICD-10-CM | POA: Insufficient documentation

## 2017-10-11 DIAGNOSIS — M545 Low back pain, unspecified: Secondary | ICD-10-CM

## 2017-10-11 DIAGNOSIS — N2889 Other specified disorders of kidney and ureter: Secondary | ICD-10-CM | POA: Insufficient documentation

## 2017-10-11 DIAGNOSIS — Z96643 Presence of artificial hip joint, bilateral: Secondary | ICD-10-CM | POA: Insufficient documentation

## 2017-10-11 DIAGNOSIS — G20 Parkinson's disease: Secondary | ICD-10-CM | POA: Diagnosis not present

## 2017-10-11 DIAGNOSIS — D472 Monoclonal gammopathy: Secondary | ICD-10-CM | POA: Diagnosis not present

## 2017-10-11 DIAGNOSIS — R109 Unspecified abdominal pain: Secondary | ICD-10-CM | POA: Diagnosis not present

## 2017-10-11 DIAGNOSIS — E1122 Type 2 diabetes mellitus with diabetic chronic kidney disease: Secondary | ICD-10-CM | POA: Insufficient documentation

## 2017-10-11 DIAGNOSIS — E119 Type 2 diabetes mellitus without complications: Secondary | ICD-10-CM | POA: Diagnosis not present

## 2017-10-11 DIAGNOSIS — N189 Chronic kidney disease, unspecified: Secondary | ICD-10-CM | POA: Diagnosis not present

## 2017-10-11 DIAGNOSIS — D649 Anemia, unspecified: Secondary | ICD-10-CM | POA: Diagnosis not present

## 2017-10-11 DIAGNOSIS — Y92410 Unspecified street and highway as the place of occurrence of the external cause: Secondary | ICD-10-CM | POA: Insufficient documentation

## 2017-10-11 DIAGNOSIS — M62830 Muscle spasm of back: Secondary | ICD-10-CM | POA: Diagnosis not present

## 2017-10-11 DIAGNOSIS — Z79899 Other long term (current) drug therapy: Secondary | ICD-10-CM | POA: Insufficient documentation

## 2017-10-11 DIAGNOSIS — Y999 Unspecified external cause status: Secondary | ICD-10-CM | POA: Insufficient documentation

## 2017-10-11 DIAGNOSIS — M25551 Pain in right hip: Secondary | ICD-10-CM | POA: Diagnosis not present

## 2017-10-11 DIAGNOSIS — M47816 Spondylosis without myelopathy or radiculopathy, lumbar region: Secondary | ICD-10-CM | POA: Diagnosis not present

## 2017-10-11 DIAGNOSIS — Z8673 Personal history of transient ischemic attack (TIA), and cerebral infarction without residual deficits: Secondary | ICD-10-CM | POA: Insufficient documentation

## 2017-10-11 DIAGNOSIS — K5730 Diverticulosis of large intestine without perforation or abscess without bleeding: Secondary | ICD-10-CM | POA: Insufficient documentation

## 2017-10-11 DIAGNOSIS — Z7984 Long term (current) use of oral hypoglycemic drugs: Secondary | ICD-10-CM | POA: Diagnosis not present

## 2017-10-11 DIAGNOSIS — I129 Hypertensive chronic kidney disease with stage 1 through stage 4 chronic kidney disease, or unspecified chronic kidney disease: Secondary | ICD-10-CM | POA: Insufficient documentation

## 2017-10-11 LAB — CBC WITH DIFFERENTIAL (CANCER CENTER ONLY)
Basophils Absolute: 0 10*3/uL (ref 0.0–0.1)
Basophils Relative: 0 %
Eosinophils Absolute: 0 10*3/uL (ref 0.0–0.5)
Eosinophils Relative: 1 %
HCT: 37.3 % (ref 34.8–46.6)
Hemoglobin: 12.1 g/dL (ref 11.6–15.9)
Lymphocytes Relative: 42 %
Lymphs Abs: 2.3 10*3/uL (ref 0.9–3.3)
MCH: 32.4 pg (ref 25.1–34.0)
MCHC: 32.4 g/dL (ref 31.5–36.0)
MCV: 99.7 fL (ref 79.5–101.0)
Monocytes Absolute: 0.4 10*3/uL (ref 0.1–0.9)
Monocytes Relative: 8 %
Neutro Abs: 2.7 10*3/uL (ref 1.5–6.5)
Neutrophils Relative %: 49 %
Platelet Count: 231 10*3/uL (ref 145–400)
RBC: 3.74 MIL/uL (ref 3.70–5.45)
RDW: 14.2 % (ref 11.2–14.5)
WBC Count: 5.5 10*3/uL (ref 3.9–10.3)

## 2017-10-11 LAB — CMP (CANCER CENTER ONLY)
ALT: 15 U/L (ref 0–44)
AST: 16 U/L (ref 15–41)
Albumin: 3.9 g/dL (ref 3.5–5.0)
Alkaline Phosphatase: 49 U/L (ref 38–126)
Anion gap: 8 (ref 5–15)
BUN: 11 mg/dL (ref 8–23)
CO2: 29 mmol/L (ref 22–32)
Calcium: 9.9 mg/dL (ref 8.9–10.3)
Chloride: 105 mmol/L (ref 98–111)
Creatinine: 1.43 mg/dL — ABNORMAL HIGH (ref 0.44–1.00)
GFR, Est AFR Am: 44 mL/min — ABNORMAL LOW (ref >60)
GFR, Estimated: 38 mL/min — ABNORMAL LOW (ref >60)
Glucose, Bld: 145 mg/dL — ABNORMAL HIGH (ref 70–99)
Potassium: 4.8 mmol/L (ref 3.5–5.1)
Sodium: 142 mmol/L (ref 135–145)
Total Bilirubin: 0.4 mg/dL (ref 0.3–1.2)
Total Protein: 7.5 g/dL (ref 6.5–8.1)

## 2017-10-11 MED ORDER — NAPROXEN 250 MG PO TABS: 250.0000 mg | ORAL_TABLET | Freq: Two times a day (BID) | ORAL | 0 refills | Status: DC | PRN

## 2017-10-11 MED ORDER — CYCLOBENZAPRINE HCL 10 MG PO TABS: 10.0000 mg | ORAL_TABLET | Freq: Three times a day (TID) | ORAL | 0 refills | Status: DC | PRN

## 2017-10-11 MED ORDER — HYDROCODONE-ACETAMINOPHEN 5-325 MG PO TABS
1.0000 | ORAL_TABLET | Freq: Once | ORAL | Status: AC
  Administered 2017-10-11: 1 via ORAL
  Filled 2017-10-11: qty 1
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
## 2017-10-11 NOTE — Discharge Instructions (Addendum)
Take naprosyn as directed for inflammation and pain (take on a schedule 2x/day with food for the next 2-3 days, then as needed thereafter) with tylenol for breakthrough pain and flexeril for muscle relaxation. Do not drive or operate machinery with muscle relaxant use. Ice to areas of soreness for the next 24 hours and then may move to heat, no more than 20 minutes at a time every hour for each. Expect to be sore for the next few days and follow up with primary care physician or your orthopedist for recheck of ongoing symptoms in the next 1 week. Return to ER for emergent changing or worsening of symptoms.
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
## 2017-10-12 LAB — KAPPA/LAMBDA LIGHT CHAINS
Kappa free light chain: 27.1 mg/L — ABNORMAL HIGH (ref 3.3–19.4)
Kappa, lambda light chain ratio: 1.53 (ref 0.26–1.65)
Lambda free light chains: 17.7 mg/L (ref 5.7–26.3)

## 2017-10-12 MED FILL — CYCLOBENZAPRINE HCL 10 MG T: 10 | 5 days supply | Qty: 15 | Fill #0

## 2017-10-13 LAB — MULTIPLE MYELOMA PANEL, SERUM
Albumin SerPl Elph-Mcnc: 3.7 g/dL (ref 2.9–4.4)
Albumin/Glob SerPl: 1.1 (ref 0.7–1.7)
Alpha 1: 0.2 g/dL (ref 0.0–0.4)
Alpha2 Glob SerPl Elph-Mcnc: 0.7 g/dL (ref 0.4–1.0)
B-Globulin SerPl Elph-Mcnc: 1.2 g/dL (ref 0.7–1.3)
Gamma Glob SerPl Elph-Mcnc: 1.2 g/dL (ref 0.4–1.8)
Globulin, Total: 3.4 g/dL (ref 2.2–3.9)
IgA: 331 mg/dL (ref 87–352)
IgG (Immunoglobin G), Serum: 1327 mg/dL (ref 700–1600)
IgM (Immunoglobulin M), Srm: 53 mg/dL (ref 26–217)
M Protein SerPl Elph-Mcnc: 0.1 g/dL — ABNORMAL HIGH
Total Protein ELP: 7.1 g/dL (ref 6.0–8.5)

## 2017-10-18 ENCOUNTER — Other Ambulatory Visit: Payer: Self-pay | Admitting: Cardiology

## 2017-10-18 ENCOUNTER — Inpatient Hospital Stay (HOSPITAL_BASED_OUTPATIENT_CLINIC_OR_DEPARTMENT_OTHER): Payer: 59 | Admitting: Oncology

## 2017-10-18 ENCOUNTER — Encounter: Payer: Self-pay | Admitting: Oncology

## 2017-10-18 VITALS — BP 129/71 | HR 69 | Temp 98.2°F | Resp 18 | Ht 66.0 in | Wt 228.4 lb

## 2017-10-18 DIAGNOSIS — D472 Monoclonal gammopathy: Secondary | ICD-10-CM

## 2017-10-18 DIAGNOSIS — D649 Anemia, unspecified: Secondary | ICD-10-CM | POA: Diagnosis not present

## 2017-10-18 DIAGNOSIS — K5730 Diverticulosis of large intestine without perforation or abscess without bleeding: Secondary | ICD-10-CM | POA: Diagnosis not present

## 2017-10-18 DIAGNOSIS — M545 Low back pain: Secondary | ICD-10-CM | POA: Diagnosis not present

## 2017-10-18 DIAGNOSIS — Z7982 Long term (current) use of aspirin: Secondary | ICD-10-CM

## 2017-10-18 DIAGNOSIS — N2889 Other specified disorders of kidney and ureter: Secondary | ICD-10-CM

## 2017-10-18 DIAGNOSIS — Z79899 Other long term (current) drug therapy: Secondary | ICD-10-CM

## 2017-10-18 DIAGNOSIS — E119 Type 2 diabetes mellitus without complications: Secondary | ICD-10-CM

## 2017-10-18 DIAGNOSIS — R109 Unspecified abdominal pain: Secondary | ICD-10-CM

## 2017-10-18 MED FILL — METOPROLOL TARTRATE 25 MG T: 25 | 30 days supply | Qty: 30 | Fill #0

## 2017-10-18 NOTE — Progress Notes (Signed)
Hematology and Oncology Follow Up Visit  Deanna Rowe [id number] 20-Oct-1955 62 y.o. 10/18/2017 9:51 AM Leighton Ruff, MDBarnes, Benjamine Mola, MD   Principle Diagnosis: 62 year old with IgG lambda MGUS diagnosed in 2018 with a an M spike of 0.2 g/dL and no endorgan damage.  She has normal IgG levels.   Current therapy: Active surveillance.  Interim History: Deanna Rowe presents today for a follow-up visit.  Since last visit, she has complaints of symptoms of abdominal pain and nausea.  She had a extensive GI evaluation including ultrasound and abdominal imaging using CT scan which showed no evidence of any pathology.  She also had lower back and pelvic pain and plain film x-rays in July 2019 showed no evidence of any pathology.  She continues to eat reasonably well and maintain her weight.  She does not have any pathological fractures or recurrent infections.  She is still able to perform all activities of daily living.  She denies any hematochezia or melena.  She denies any abdominal distention.  She does not report any headaches, blurry vision, syncope or seizures. Does not report any fevers, chills or sweats.  Does not report any cough, wheezing or hemoptysis.  Does not report any chest pain, palpitation, orthopnea or leg edema. Does not report any constipation or diarrhea.  Does not report any skeletal complaints.    Does not report frequency, urgency or hematuria.  Does not report any skin rashes or lesions. Does not report any heat or cold intolerance.  Does not report any lymphadenopathy or petechiae.  Does not report any anxiety or depression.  Remaining review of systems is negative.    Medications: I have reviewed the patient's current medications.  Current Outpatient Medications  Medication Sig Dispense Refill  . ACCU-CHEK FASTCLIX LANCETS MISC USE TO CHECK BLOOD SUGAR 3 TIMES A DAY 102 each 2  . ACCU-CHEK GUIDE test strip USE TO CHECK BLOOD SUGAR 3 TIMES PER DAY. 100 each 2  .  albuterol (PROVENTIL HFA;VENTOLIN HFA) 108 (90 BASE) MCG/ACT inhaler Inhale 2 puffs into the lungs as needed for wheezing or shortness of breath (every 4 hours as needed for cough, wheezing and chest tightness).    Marland Kitchen amLODipine (NORVASC) 5 MG tablet Take 5 mg by mouth every morning.     . ARIPiprazole (ABILIFY) 5 MG tablet Take 2 mg by mouth every evening.     Marland Kitchen aspirin EC 81 MG tablet Take 81 mg by mouth every evening.    . beclomethasone (QVAR) 80 MCG/ACT inhaler Inhale 1 puff into the lungs daily as needed.    . Bromocriptine Mesylate (CYCLOSET) 0.8 MG TABS Take 2 tablets (1.6 mg total) by mouth daily. 180 tablet 3  . buPROPion (WELLBUTRIN XL) 150 MG 24 hr tablet Take 450 mg by mouth every morning.     Marland Kitchen CALCIUM-VITAMIN D PO Take 1 tablet by mouth daily.    . cyclobenzaprine (FLEXERIL) 10 MG tablet Take 1 tablet (10 mg total) by mouth 3 (three) times daily as needed for muscle spasms. 15 tablet 0  . FOLATE-B12-INTRINSIC FACTOR PO Take 800 mcg by mouth daily.    . furosemide (LASIX) 20 MG tablet Take 20 mg by mouth daily as needed for edema.     . gabapentin (NEURONTIN) 300 MG capsule Take 300 mg by mouth 2 (two) times daily.     Marland Kitchen LORazepam (ATIVAN) 1 MG tablet Take 1 mg by mouth 4 (four) times daily.    Marland Kitchen losartan-hydrochlorothiazide (HYZAAR) 100-12.5 MG tablet Take  1 tablet by mouth daily.    . metoprolol tartrate (LOPRESSOR) 25 MG tablet Take 0.5 tablets (12.5 mg total) by mouth every morning. (Patient taking differently: Take 12.5 mg by mouth 2 (two) times daily. ) 90 tablet 3  . pantoprazole (PROTONIX) 40 MG tablet Take 40 mg by mouth 2 (two) times daily.     . repaglinide (PRANDIN) 2 MG tablet Take 2 mg by mouth 2 (two) times daily before a meal.    . rOPINIRole (REQUIP) 2 MG tablet Take 2 mg by mouth 2 (two) times daily.  3  . TRADJENTA 5 MG TABS tablet TAKE 1/2 TABLET BY MOUTH EVERYDAY 30 tablet 5  . valACYclovir (VALTREX) 1000 MG tablet Take 500 mg by mouth daily.     . vitamin C  (ASCORBIC ACID) 500 MG tablet Take 500 mg by mouth daily.    . Vitamin D, Ergocalciferol, 2000 units CAPS Take 1 capsule by mouth daily. 2000 units daily.    Marland Kitchen vortioxetine HBr (TRINTELLIX) 20 MG TABS Take 20 mg by mouth daily.    Marland Kitchen zolpidem (AMBIEN) 5 MG tablet Take 5 mg by mouth at bedtime.      No current facility-administered medications for this visit.    Facility-Administered Medications Ordered in Other Visits  Medication Dose Route Frequency Provider Last Rate Last Dose  . tranexamic acid (CYKLOKAPRON) topical -INTRAOP  2,000 mg Topical Once Cecilio Asper, [employer], PA-C         Allergies:  Allergies  Allergen Reactions  . Fluticasone-Salmeterol Anaphylaxis  . Codeine Nausea And Vomiting  . Fetzima [Levomilnacipran] Nausea And Vomiting  . Nsaids Other (See Comments)    Upset stomach   . Trulicity [Dulaglutide] Nausea And Vomiting    Significant and undesirably rapid (40 lbs) weight loss   . Xanax [Alprazolam] Other (See Comments)    disoriented  . Amoxicillin Rash    Has patient had a PCN reaction causing immediate rash, facial/tongue/throat swelling, SOB or lightheadedness with hypotension: Yes Has patient had a PCN reaction causing severe rash involving mucus membranes or skin necrosis: No Has patient had a PCN reaction that required hospitalization: No Has patient had a PCN reaction occurring within the last 10 years: No If all of the above answers are "NO", then may proceed with Cephalosporin use.   . Pseudoephedrine Hcl Er Palpitations    Past Medical History, Surgical history, Social history, and Family History were reviewed and updated.  Review of Systems:  Remaining ROS negative.  Physical Exam: Blood pressure 129/71, pulse 69, temperature 98.2 F (36.8 C), temperature source Oral, resp. rate 18, height 5' 6"  (1.676 m), weight 228 lb 6.4 oz (103.6 kg), SpO2 98 %. ECOG: 1 General appearance: alert and cooperative appeared without distress. Head: Normocephalic,  without obvious abnormality Oropharynx: No oral thrush or ulcers. Eyes: No scleral icterus.  Pupils are equal and round reactive to light. Lymph nodes: Cervical, supraclavicular, and axillary nodes normal. Heart:regular rate and rhythm, S1, S2 normal, no murmur, click, rub or gallop Lung:chest clear, no wheezing, rales, normal symmetric air entry Abdomin: soft, non-tender, without masses or organomegaly. Neurological: No motor, sensory deficits.  Intact deep tendon reflexes. Skin: No rashes or lesions.  No ecchymosis or petechiae. Musculoskeletal: No joint deformity or effusion.     Lab Results: Lab Results  Component Value Date   WBC 5.5 10/11/2017   HGB 12.1 10/11/2017   HCT 37.3 10/11/2017   MCV 99.7 10/11/2017   PLT 231 10/11/2017     Chemistry  Component Value Date/Time   NA 142 10/11/2017 1050   NA 142 01/12/2017 0914   K 4.8 10/11/2017 1050   K 4.2 01/12/2017 0914   CL 105 10/11/2017 1050   CO2 29 10/11/2017 1050   CO2 28 01/12/2017 0914   BUN 11 10/11/2017 1050   BUN 12.0 01/12/2017 0914   CREATININE 1.43 (H) 10/11/2017 1050   CREATININE 1.3 (H) 01/12/2017 0914      Component Value Date/Time   CALCIUM 9.9 10/11/2017 1050   CALCIUM 9.6 01/12/2017 0914   ALKPHOS 49 10/11/2017 1050   ALKPHOS 43 01/12/2017 0914   AST 16 10/11/2017 1050   AST 19 01/12/2017 0914   ALT 15 10/11/2017 1050   ALT 18 01/12/2017 0914   BILITOT 0.4 10/11/2017 1050   BILITOT 0.28 01/12/2017 0914     Results for ISELA, STANTZ (MRN [id number]) as of 10/18/2017 09:51  Ref. Range 10/11/2017 10:50  Total Protein ELP Latest Ref Range: 6.0 - 8.5 g/dL 7.1  Albumin SerPl Elph-Mcnc Latest Ref Range: 2.9 - 4.4 g/dL 3.7  Albumin/Glob SerPl Latest Ref Range: 0.7 - 1.7  1.1  Alpha2 Glob SerPl Elph-Mcnc Latest Ref Range: 0.4 - 1.0 g/dL 0.7  Alpha 1 Latest Ref Range: 0.0 - 0.4 g/dL 0.2  Gamma Glob SerPl Elph-Mcnc Latest Ref Range: 0.4 - 1.8 g/dL 1.2  M Protein SerPl Elph-Mcnc Latest Ref  Range: Not Observed g/dL 0.1 (H)  IFE 1 Unknown Comment  Globulin, Total Latest Ref Range: 2.2 - 3.9 g/dL 3.4  B-Globulin SerPl Elph-Mcnc Latest Ref Range: 0.7 - 1.3 g/dL 1.2  IgG (Immunoglobin G), Serum Latest Ref Range: 700 - 1,600 mg/dL 1,327  IgM (Immunoglobulin M), Srm Latest Ref Range: 26 - 217 mg/dL 53  IgA Latest Ref Range: 87 - 352 mg/dL 331    Radiological Studies: EXAM: CT ABDOMEN AND PELVIS WITH CONTRAST  TECHNIQUE: Multidetector CT imaging of the abdomen and pelvis was performed using the standard protocol following bolus administration of intravenous contrast.  CONTRAST:  164m OMNIPAQUE IOHEXOL 300 MG/ML  SOLN  COMPARISON:  02/24/2014  FINDINGS: Lower chest: The lung bases are clear of acute process. No pleural effusion or pulmonary lesions. The heart is normal in size. No pericardial effusion. The distal esophagus and aorta are unremarkable.  Hepatobiliary: No focal hepatic lesions or intrahepatic biliary dilatation. The gallbladder is normal. No common bile duct dilatation.  Pancreas: No mass, inflammation or ductal dilatation.  Spleen: Normal size.  No focal lesions.  Adrenals/Urinary Tract: The adrenal glands and kidneys are unremarkable. No renal, ureteral or bladder calculi or mass. Bladder is moderately obscured by bilateral hip prosthesis. Stable right-sided pelvic calcification, likely phlebolith.  Stomach/Bowel: The stomach, duodenum, small bowel and colon are unremarkable. No acute inflammatory changes, mass lesions or obstructive findings. The terminal ileum is normal. The appendix is normal. Scattered colonic diverticulosis without findings for acute diverticulitis.  Vascular/Lymphatic: The aorta is normal in caliber. No dissection. The branch vessels are patent. The major venous structures are patent. No mesenteric or retroperitoneal mass or adenopathy. Small scattered lymph nodes are noted.  Reproductive: The uterus and  ovaries are normal.  Other: No pelvic mass or free pelvic fluid collections. No abdominal wall hernia or subcutaneous lesions.  Musculoskeletal: No acute or significant bony findings. Significant artifact from bilateral hip prosthesis  IMPRESSION: 1. No acute abdominal/pelvic findings, mass lesions or adenopathy. 2. Scattered colonic diverticulosis but no findings for acute diverticulitis.    Impression and Plan:  62year old woman with:  1.    IgG lambda MGUS presented with M spike of 0.2 g/dL detected in December 2018.  She has no evidence of endorgan damage to suggest active myeloma and she remains on active surveillance.  Her laboratory data obtained in July 2019 were personally reviewed and discussed with her in detail.  Her M spike is down to 0.1 g/dL and IgG levels are normal.  She has normal serum light chains without any evidence to suggest endorgan damage.  She had a normal CBC and slightly elevated creatinine.  The natural course of this disease was reviewed today and she appears to have very low risk MGUS with a risk of transitioning to active myeloma is estimated around 1 %/year.  I recommended active surveillance and repeat protein studies on an annual basis.  He is agreeable with this plan at this time.  For protein studies start to change in the future, bone marrow biopsy would be indicated.  2.  Renal insufficiency: Unlikely related to a plasma cell disorder.  Long-standing diabetes could be contributing.  3.  Mild anemia: Her hemoglobin is within normal range.   4.  Abdominal pain: She had multiple imaging studies that I reviewed personally and showed no evidence to suggest malignancy.  5.  Follow-up: We will be in 12 months sooner if needed.  15 minutes was spent face-to-face today with the patient.  More than 50% was spent on education, counseling and answering questions regarding her diagnosis and future plan of care.     Zola Button,  MD 7/17/20199:51 AM

## 2017-10-20 DIAGNOSIS — M5417 Radiculopathy, lumbosacral region: Secondary | ICD-10-CM | POA: Diagnosis not present

## 2017-10-20 MED FILL — predniSONE 50 MG TABS: 50 | 7 days supply | Qty: 7 | Fill #0

## 2017-10-25 ENCOUNTER — Other Ambulatory Visit: Payer: Self-pay | Admitting: Endocrinology

## 2017-10-25 DIAGNOSIS — M7061 Trochanteric bursitis, right hip: Secondary | ICD-10-CM | POA: Diagnosis not present

## 2017-10-25 MED FILL — LOSARTAN-HCTZ 100-12.5 MG T: 100-12.5 | 90 days supply | Qty: 90 | Fill #0

## 2017-10-25 MED FILL — ACCU-CHEK FASTCLIX LANCETS: 34 days supply | Qty: 102 | Fill #0

## 2017-10-25 MED FILL — ACCU-CHEK GUIDE STRP: 90 days supply | Qty: 300 | Fill #1

## 2017-11-03 ENCOUNTER — Encounter: Payer: Self-pay | Admitting: Oncology

## 2017-11-06 MED FILL — REPAGLINIDE 2 MG TABLET: 2 | 30 days supply | Qty: 120 | Fill #2

## 2017-11-06 MED FILL — AMLODIPINE BESYLATE 5 MG TA: 5 | 90 days supply | Qty: 90 | Fill #0

## 2017-11-07 ENCOUNTER — Other Ambulatory Visit: Payer: Self-pay | Admitting: Family Medicine

## 2017-11-07 DIAGNOSIS — E042 Nontoxic multinodular goiter: Secondary | ICD-10-CM

## 2017-11-21 DIAGNOSIS — M5136 Other intervertebral disc degeneration, lumbar region: Secondary | ICD-10-CM | POA: Insufficient documentation

## 2017-11-21 DIAGNOSIS — E119 Type 2 diabetes mellitus without complications: Secondary | ICD-10-CM | POA: Diagnosis not present

## 2017-11-21 DIAGNOSIS — N183 Chronic kidney disease, stage 3 (moderate): Secondary | ICD-10-CM | POA: Diagnosis not present

## 2017-11-21 DIAGNOSIS — E042 Nontoxic multinodular goiter: Secondary | ICD-10-CM | POA: Diagnosis not present

## 2017-11-22 ENCOUNTER — Other Ambulatory Visit: Payer: Self-pay | Admitting: Obstetrics and Gynecology

## 2017-11-22 DIAGNOSIS — Z6836 Body mass index (BMI) 36.0-36.9, adult: Secondary | ICD-10-CM | POA: Diagnosis not present

## 2017-11-22 DIAGNOSIS — Z01419 Encounter for gynecological examination (general) (routine) without abnormal findings: Secondary | ICD-10-CM | POA: Diagnosis not present

## 2017-11-22 DIAGNOSIS — R140 Abdominal distension (gaseous): Secondary | ICD-10-CM | POA: Diagnosis not present

## 2017-11-24 ENCOUNTER — Other Ambulatory Visit (HOSPITAL_COMMUNITY): Payer: Self-pay | Admitting: Family Medicine

## 2017-11-24 ENCOUNTER — Ambulatory Visit
Admission: RE | Admit: 2017-11-24 | Discharge: 2017-11-24 | Disposition: A | Discharge status: Home / Self Care | Payer: 59 | Source: Ambulatory Visit | Attending: Family Medicine | Admitting: Family Medicine

## 2017-11-24 DIAGNOSIS — E041 Nontoxic single thyroid nodule: Secondary | ICD-10-CM

## 2017-11-24 DIAGNOSIS — E010 Iodine-deficiency related diffuse (endemic) goiter: Secondary | ICD-10-CM | POA: Diagnosis not present

## 2017-11-24 DIAGNOSIS — E042 Nontoxic multinodular goiter: Secondary | ICD-10-CM

## 2017-11-29 MED FILL — PANTOPRAZOLE SOD DR 40 MG T: 40 | 45 days supply | Qty: 90 | Fill #0

## 2017-11-29 MED FILL — GABAPENTIN 300 MG CAPSULE: 300 | 90 days supply | Qty: 180 | Fill #0

## 2017-12-05 ENCOUNTER — Ambulatory Visit (INDEPENDENT_AMBULATORY_CARE_PROVIDER_SITE_OTHER): Payer: 59 | Admitting: Cardiology

## 2017-12-05 ENCOUNTER — Encounter: Payer: Self-pay | Admitting: Cardiology

## 2017-12-05 VITALS — BP 110/80 | HR 83 | Ht 66.0 in | Wt 223.0 lb

## 2017-12-05 DIAGNOSIS — I493 Ventricular premature depolarization: Secondary | ICD-10-CM | POA: Diagnosis not present

## 2017-12-05 DIAGNOSIS — R002 Palpitations: Secondary | ICD-10-CM | POA: Diagnosis not present

## 2017-12-05 MED ORDER — PROPRANOLOL HCL 40 MG PO TABS: 40.0000 mg | ORAL_TABLET | Freq: Two times a day (BID) | ORAL | 3 refills | Status: DC

## 2017-12-05 MED FILL — PROPRANOLOL 40 MG TABLET: 40 | 90 days supply | Qty: 180 | Fill #0

## 2017-12-05 MED FILL — ARIPiprazole 2 MG TABS: 2 | 90 days supply | Qty: 90 | Fill #1

## 2017-12-05 MED FILL — rOPINIRole HCL 2 MG TABS: 2 | 90 days supply | Qty: 180 | Fill #0

## 2017-12-05 MED FILL — ATORVASTATIN CALCIUM 10 MG: 10 | 90 days supply | Qty: 90 | Fill #0

## 2017-12-05 MED FILL — ZOLPIDEM TARTRATE 5 MG TAB: 5 | 90 days supply | Qty: 90 | Fill #0

## 2017-12-05 NOTE — Progress Notes (Signed)
12/05/2017 Deanna Rowe Clear   01/21/1956  [id number]  Primary Physician Leighton Ruff, MD Primary Cardiologist: Dr. Irish Lack   Reason for Visit/CC: F/u for PVCs and PACs: medication refills  HPI:  Deanna Rowe is a 62 y.o. female who is being seen today for f./u given history of symptomatic PVCs and PACs. She is primarlily here because she is in need of refills of her beta blocker.   She has a history of palpitations and tachycardia. In 2015, she was seen by Dr. Irish Lack for dzziness and palpitations. Dr. Irish Lack ordered an event monitor as well as an echocardiogram to evaluate for structural heart disease. Her echo revealed normal left ventricular systolic function, with an EF of 55-60% as well as normal valvular function. Wall motion was asynchronous consistent with intraventricular conduction delay (IVCD orbundle branch block); there were no regional wall motion abnormalities. Doppler parameters were consistent with abnormal left ventricular relaxation (grade 1 diastolic dysfunction). Heart monitor showed sinus rhythm with no arrhythmias. Labs were also obtained during that time. CBC was negative for anemia. Hemoglobin 12.5.  When she presented 04/16/15, she reported pulse rates in the low 200s with exercise and also noted palpitations, exertional dyspnea and chest pain.  I ordered an exercise tolerance to rule out ischemia and exertional arrhythmias as well as a 48-hour Holter monitor.  Stress test was negative for ischemia.  48-hour Holter monitor showed normal sinus rhythm with PVCs and PACs.  I started her on beta-blocker therapy with low-dose metoprolol.  She was seen several weeks in follow-up and reported symptomatic improvement with metoprolol.  Since then, she has continued to do well without any significant recurrence and is here primarily for medication refills after running out of her metoprolol.  She also has hypertension, type 2 diabetes and stage II chronic kidney disease,  all followed by her PCP.  She is here today with her husband.  She continues to deny any cardiac symptoms.  No recurrent tachypalpitations.  She denies chest pain and dyspnea.  No dizziness, lightheadedness, syncope/near syncope.  She also denies lower extremity edema, orthopnea and PND.  Since her last office visit however she has developed a resting tremor and has been seen by neurology.  EKG today shows normal sinus rhythm with controlled heart rate.  Blood pressure is well controlled.  Current Meds  Medication Sig  . ACCU-CHEK FASTCLIX LANCETS MISC USE TO CHECK BLOOD SUGAR 3 TIMES A DAY  . ACCU-CHEK GUIDE test strip USE TO CHECK BLOOD SUGAR 3 TIMES PER DAY.  Marland Kitchen albuterol (PROVENTIL HFA;VENTOLIN HFA) 108 (90 BASE) MCG/ACT inhaler Inhale 2 puffs into the lungs as needed for wheezing or shortness of breath (every 4 hours as needed for cough, wheezing and chest tightness).  Marland Kitchen amLODipine (NORVASC) 5 MG tablet Take 5 mg by mouth every morning.   . ARIPiprazole (ABILIFY) 5 MG tablet Take 2 mg by mouth every evening.   Marland Kitchen aspirin EC 81 MG tablet Take 81 mg by mouth every evening.  . beclomethasone (QVAR) 80 MCG/ACT inhaler Inhale 1 puff into the lungs daily as needed.  Marland Kitchen buPROPion (WELLBUTRIN XL) 150 MG 24 hr tablet Take 450 mg by mouth every morning.   Marland Kitchen CALCIUM-VITAMIN D PO Take 1 tablet by mouth daily.  . furosemide (LASIX) 20 MG tablet Take 20 mg by mouth daily as needed for edema.   . gabapentin (NEURONTIN) 300 MG capsule Take 300 mg by mouth 2 (two) times daily.   Marland Kitchen LORazepam (ATIVAN) 1 MG tablet  Take 1 mg by mouth 4 (four) times daily.  Marland Kitchen losartan-hydrochlorothiazide (HYZAAR) 100-12.5 MG tablet Take 1 tablet by mouth daily.  . metoprolol tartrate (LOPRESSOR) 25 MG tablet Take 0.5 tablets (12.5 mg total) by mouth 2 (two) times daily. NEED OV.  . pantoprazole (PROTONIX) 40 MG tablet Take 40 mg by mouth 2 (two) times daily.   . repaglinide (PRANDIN) 2 MG tablet Take 2 mg by mouth 2 (two) times  daily before a meal.  . rOPINIRole (REQUIP) 2 MG tablet Take 2 mg by mouth 2 (two) times daily.  . valACYclovir (VALTREX) 1000 MG tablet Take 500 mg by mouth daily.   . vitamin C (ASCORBIC ACID) 500 MG tablet Take 500 mg by mouth daily.  . Vitamin D, Ergocalciferol, 2000 units CAPS Take 1 capsule by mouth daily. 2000 units daily.  Marland Kitchen vortioxetine HBr (TRINTELLIX) 20 MG TABS Take 20 mg by mouth daily.  Marland Kitchen zolpidem (AMBIEN) 5 MG tablet Take 5 mg by mouth at bedtime.    Allergies  Allergen Reactions  . Fluticasone-Salmeterol Anaphylaxis  . Codeine Nausea And Vomiting  . Fetzima [Levomilnacipran] Nausea And Vomiting  . Nsaids Other (See Comments)    Upset stomach   . Trulicity [Dulaglutide] Nausea And Vomiting    Significant and undesirably rapid (40 lbs) weight loss   . Xanax [Alprazolam] Other (See Comments)    disoriented  . Amoxicillin Rash    Has patient had a PCN reaction causing immediate rash, facial/tongue/throat swelling, SOB or lightheadedness with hypotension: Yes Has patient had a PCN reaction causing severe rash involving mucus membranes or skin necrosis: No Has patient had a PCN reaction that required hospitalization: No Has patient had a PCN reaction occurring within the last 10 years: No If all of the above answers are "NO", then may proceed with Cephalosporin use.   . Pseudoephedrine Hcl Er Palpitations   Past Medical History:  Diagnosis Date  . Anxiety   . Arthritis    L HIP  . Avascular necrosis of hip (HCC)    LEFT  . Depression   . Diabetes mellitus   . Diabetes mellitus, type II ([place])   . Dry cough   . GERD (gastroesophageal reflux disease)   . Hypertension   . Insomnia    takes Ambien nightly  . Kidney disease   . Obesity   . Parkinsonism ([place]) 03/02/2017  . PONV (postoperative nausea and vomiting)   . TIA (transient ischemic attack) 2012   no residual problems  . Urgency incontinence    Family History  Problem Relation Age of Onset  . Alcohol  abuse Brother   . Alcohol abuse Brother   . Alcohol abuse Brother   . Alcohol abuse Brother   . Diabetes Neg Hx    Past Surgical History:  Procedure Laterality Date  . DILATION AND CURETTAGE OF UTERUS    . ESOPHAGEAL MANOMETRY N/A 09/03/2012   Procedure: ESOPHAGEAL MANOMETRY (EM);  Surgeon: Garlan Fair, MD;  Location: [facility];  Service: Endoscopy;  Laterality: N/A;  . ESOPHAGEAL MANOMETRY N/A 06/19/2017   Procedure: ESOPHAGEAL MANOMETRY (EM);  Surgeon: Clarene Essex, MD;  Location: [facility];  Service: Endoscopy;  Laterality: N/A;  . EXPLORATORY LAPAROTOMY    . JOINT REPLACEMENT  2011   rt total hip  . PILONIDAL CYST / SINUS EXCISION    . TOTAL HIP ARTHROPLASTY Left 10/09/2013   Procedure: LEFT TOTAL HIP ARTHROPLASTY ANTERIOR APPROACH;  Surgeon: [name], MD;  Location: [facility];  Service: Orthopedics;  Laterality: Left;   Social History   Socioeconomic History  . Marital status: Married    Spouse name: Not on file  . Number of children: Not on file  . Years of education: Not on file  . Highest education level: Not on file  Occupational History  . Not on file  Social Needs  . Financial resource strain: Not on file  . Food insecurity:    Worry: Not on file    Inability: Not on file  . Transportation needs:    Medical: Not on file    Non-medical: Not on file  Tobacco Use  . Smoking status: Never Smoker  . Smokeless tobacco: Never Used  Substance and Sexual Activity  . Alcohol use: No    Alcohol/week: 0.0 standard drinks  . Drug use: No  . Sexual activity: Not Currently  Lifestyle  . Physical activity:    Days per week: Not on file    Minutes per session: Not on file  . Stress: Not on file  Relationships  . Social connections:    Talks on phone: Not on file    Gets together: Not on file    Attends religious service: Not on file    Active member of club or organization: Not on file    Attends meetings of clubs or organizations: Not on file     Relationship status: Not on file  . Intimate partner violence:    Fear of current or ex partner: Not on file    Emotionally abused: Not on file    Physically abused: Not on file    Forced sexual activity: Not on file  Other Topics Concern  . Not on file  Social History Narrative   Lives with husband   Caffeine use: none   Right handed      Review of Systems: General: negative for chills, fever, night sweats or weight changes.  Cardiovascular: negative for chest pain, dyspnea on exertion, edema, orthopnea, palpitations, paroxysmal nocturnal dyspnea or shortness of breath Dermatological: negative for rash Respiratory: negative for cough or wheezing Urologic: negative for hematuria Abdominal: negative for nausea, vomiting, diarrhea, bright red blood per rectum, melena, or hematemesis Neurologic: negative for visual changes, syncope, or dizziness All other systems reviewed and are otherwise negative except as noted above.   Physical Exam:  Blood pressure 110/80, pulse 83, height 5\' 6"  (1.676 m), weight 223 lb (101.2 kg).  General appearance: alert, cooperative, no distress and resing tremor Neck: no carotid bruit and no JVD Lungs: clear to auscultation bilaterally Heart: regular rate and rhythm, S1, S2 normal, no murmur, click, rub or gallop Extremities: extremities normal, atraumatic, no cyanosis or edema Pulses: 2+ and symmetric Skin: Skin color, texture, turgor normal. No rashes or lesions Neurologic: Grossly normal  EKG NSR 83 bpm -- personally reviewed   ASSESSMENT AND PLAN:   1.  H/Osymptomatic PVCs and PACs: Confirmed on 48 hr monitor. Echo with normal EF. She notes control of symptoms with low-dose metoprolol.  Denies any recurrent symptoms.  She wishes to continue on beta-blocker therapy.  Given the development of a resting tremor we will switch her beta-blocker from metoprolol to propanolol, at 40 mg twice daily.  I discussed dosing with our office pharmacist who agrees  with the starting dose.  We will have her follow-up in 4 weeks to reassess response after beta-blocker change.  2.  Hypertension: Controlled on current regimen.  We are changing her beta-blocker from metoprolol to propanolol for  reasons outlined above.  We will have her follow-up in 4 weeks for repeat assessment.  3.  Type 2 diabetes: Followed by PCP.  She is on an ARB for renal protection.  Follow-Up in 4 weeks  Brittainy Ladoris Gene, MHS [facility] 12/05/2017 10:58 AM

## 2017-12-05 NOTE — Patient Instructions (Addendum)
Medication Instructions:  Your physician has recommended you make the following change in your medication:  1.STOP METOPROLOL  2. START PROPRANOLOL 40 MG TWICE DAILY.  Labwork: None ordered  Testing/Procedures: None ordered  Follow-Up: Your physician wants you to follow-up WITH BRITTANY SIMMONS, PA-C ON October 1st AT 1:30 PM   Any Other Special Instructions Will Be Listed Below (If Applicable).  PLEASE CALL OUR OFFICE IF YOU HAVE ANY SIDE EFFECTS FROM THE MEDICATION.   If you need a refill on your cardiac medications before your next appointment, please call your pharmacy.

## 2017-12-07 MED FILL — LANTUS SOLOSTAR 100 UNITS/M: 100 | 90 days supply | Qty: 9 | Fill #0

## 2017-12-07 MED FILL — TECHLITE PEN NDL 32GX1/4: 32G X 6 MM | 90 days supply | Qty: 100 | Fill #0

## 2017-12-07 MED FILL — TECHLITE PEN NDL 32GX1/4": 32G X 6 MM | 90 days supply | Qty: 100 | Fill #0

## 2017-12-08 DIAGNOSIS — R1310 Dysphagia, unspecified: Secondary | ICD-10-CM | POA: Diagnosis not present

## 2017-12-08 DIAGNOSIS — R140 Abdominal distension (gaseous): Secondary | ICD-10-CM | POA: Diagnosis not present

## 2017-12-08 DIAGNOSIS — N183 Chronic kidney disease, stage 3 (moderate): Secondary | ICD-10-CM | POA: Diagnosis not present

## 2017-12-08 MED FILL — ACCU-CHEK FASTCLIX LANCETS: 34 days supply | Qty: 102 | Fill #1

## 2017-12-12 MED FILL — metFORMIN HCL 500 MG TABS: 500 | 30 days supply | Qty: 60 | Fill #0

## 2017-12-15 MED FILL — buPROPion HCL ER (XL) 150 M: 150 | 90 days supply | Qty: 270 | Fill #0

## 2017-12-21 ENCOUNTER — Encounter: Payer: Self-pay | Admitting: Cardiology

## 2017-12-23 DIAGNOSIS — J0100 Acute maxillary sinusitis, unspecified: Secondary | ICD-10-CM | POA: Diagnosis not present

## 2017-12-23 DIAGNOSIS — E119 Type 2 diabetes mellitus without complications: Secondary | ICD-10-CM | POA: Diagnosis not present

## 2017-12-28 ENCOUNTER — Other Ambulatory Visit: Payer: Self-pay

## 2017-12-28 ENCOUNTER — Ambulatory Visit: Payer: 59 | Attending: Orthopedic Surgery | Admitting: Physical Therapy

## 2017-12-28 DIAGNOSIS — M25552 Pain in left hip: Secondary | ICD-10-CM | POA: Insufficient documentation

## 2017-12-28 DIAGNOSIS — R2689 Other abnormalities of gait and mobility: Secondary | ICD-10-CM | POA: Insufficient documentation

## 2017-12-28 DIAGNOSIS — M25652 Stiffness of left hip, not elsewhere classified: Secondary | ICD-10-CM | POA: Diagnosis not present

## 2017-12-28 DIAGNOSIS — R262 Difficulty in walking, not elsewhere classified: Secondary | ICD-10-CM | POA: Insufficient documentation

## 2017-12-28 DIAGNOSIS — M6281 Muscle weakness (generalized): Secondary | ICD-10-CM | POA: Insufficient documentation

## 2017-12-28 NOTE — Patient Instructions (Signed)

## 2017-12-28 NOTE — Therapy (Signed)
[facility] [address]  [place], [place], [address] Phone: [phone]   Fax:  [phone]  Physical Therapy Evaluation  Patient Details  Name: Deanna Rowe MRN: [id number] Date of Birth: [birth year] Referring Provider (PT): Rod Can, MD   Encounter Date: 12/28/2017  PT End of Session - 12/28/17 1025    Visit Number  1    Number of Visits  12    Date for PT Re-Evaluation  02/08/18    Authorization Type  [facility] & Medicare    PT Start Time  1025    PT Stop Time  1119    PT Time Calculation (min)  54 min    Activity Tolerance  Patient tolerated treatment well;Patient limited by pain    Behavior During Therapy  [facility] for tasks assessed/performed       Past Medical History:  Diagnosis Date  . Anxiety   . Arthritis    L HIP  . Avascular necrosis of hip (HCC)    LEFT  . Depression   . Diabetes mellitus   . Diabetes mellitus, type II ([place])   . Dry cough   . GERD (gastroesophageal reflux disease)   . Hypertension   . Insomnia    takes Ambien nightly  . Kidney disease   . Obesity   . Parkinsonism ([place]) 03/02/2017  . PONV (postoperative nausea and vomiting)   . TIA (transient ischemic attack) 2012   no residual problems  . Urgency incontinence     Past Surgical History:  Procedure Laterality Date  . DILATION AND CURETTAGE OF UTERUS    . ESOPHAGEAL MANOMETRY N/A 09/03/2012   Procedure: ESOPHAGEAL MANOMETRY (EM);  Surgeon: Garlan Fair, MD;  [facility]: [facility];  Service: Endoscopy;  Laterality: N/A;  . ESOPHAGEAL MANOMETRY N/A 06/19/2017   Procedure: ESOPHAGEAL MANOMETRY (EM);  Surgeon: Clarene Essex, MD;  Location: [facility];  Service: Endoscopy;  Laterality: N/A;  . EXPLORATORY LAPAROTOMY    . JOINT REPLACEMENT  2011   rt total hip  . PILONIDAL CYST / SINUS EXCISION    . TOTAL HIP ARTHROPLASTY Left 10/09/2013   Procedure: LEFT TOTAL HIP ARTHROPLASTY ANTERIOR APPROACH;  Surgeon: [name], MD;  Location: [facility];  Service: Orthopedics;  Laterality: Left;    There were no vitals filed for this visit.   Subjective Assessment - 12/28/17 1030    Subjective  Pt has h/o R hip bursitis over last few years. Typically managed well with injections, but most recent injection in March or April was not as effective.    Pertinent History  B THA - R 2011 & L 2015    Limitations  Sitting;Standing;Walking    How long can you sit comfortably?  1 hr    How long can you stand comfortably?  45 min    How long can you walk comfortably?  ~200 ft; has to use rollator to walk longer distances    Diagnostic tests  10/11/17 R hip x-ray: No acute osseous abnormality of the pelvis and right hip    Patient Stated Goals  "no more bursitis"    Currently in Pain?  Yes    Pain Score  5    up to 10/10 at worst   Pain Location  Hip    Pain Orientation  Right;Lateral    Pain Descriptors / Indicators  Sharp    Pain Type  Acute pain;Chronic pain    Pain Radiating Towards  down  front of R leg to knee    Pain Onset  1 to 4 weeks ago   acute exacerbation over past 3 weeks   Pain Frequency  Intermittent    Aggravating Factors   getting in/out or bed or in/out of car; walking; sitting in reclined position    Pain Relieving Factors  Tylenol, ice better than heat    Effect of Pain on Daily Activities  difficulty climbing stairs; difficulty with household chores (washing clothes); difficulty getting in/out of car         [facility] PT Assessment - 12/28/17 1025      Assessment   Medical Diagnosis  R hip trochanteric bursitis     Referring Provider (PT)  Rod Can, MD    Onset Date/Surgical Date  --   acute exacerbation over past 3 wks; chronic pain since ~2016   Next MD Visit  PRN    Prior Therapy  PT for same problem in 2016      Balance Screen   Has the patient fallen in the past 6 months  No    Has the patient had a decrease in activity level because of a fear of falling?   Yes    Is the patient  reluctant to leave their home because of a fear of falling?   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      Cognition   Overall Cognitive Status  Within Functional Limits for tasks assessed      Observation/Other Assessments   Focus on Therapeutic Outcomes (FOTO)   Hip - 40% (60% limitation); predicted 54% (46% limitation)      Sensation   Light Touch  Appears Intact      ROM / Strength   AROM / PROM / Strength  AROM;Strength      AROM   Overall AROM Comments  all movements at the L hip are decreased due to hip replacement     AROM Assessment Site  Lumbar;Hip    Lumbar Flexion  hands to mid shins    Lumbar Extension  50% limited    Lumbar - Right Side Bend  hands to lateral knee - increased R hip pain    Lumbar - Left Side Bend  hand to lateral knee    Lumbar - Right Rotation  40% limited - increased R hip pain    Lumbar - Left Rotation  40% limited      Strength   Strength Assessment Site  Hip;Knee    Right/Left Hip  Right;Left    Right Hip Flexion  4-/5    Right Hip Extension  3+/5    Right Hip External Rotation   3+/5    Right Hip Internal Rotation  3+/5   pain   Right Hip  ABduction  3+/5    Right Hip ADduction  2/5    Left Hip Flexion  4/5    Left Hip Extension  4/5    Left Hip External Rotation  4/5    Left Hip Internal Rotation  4+/5    Left Hip ABduction  4+/5    Left Hip ADduction  4/5    Right/Left Knee  Right;Left    Right Knee  Flexion  4-/5    Right Knee Extension  4/5    Left Knee Flexion  4+/5    Left Knee Extension  4+/5      Flexibility   Soft Tissue Assessment /Muscle Length  yes    Hamstrings  mod tight B    Quadriceps  mild/mod tight in R hip flexors & quads    ITB  mod tight R>L    Piriformis  mild tight R, mod tight L      Palpation   Palpation comment  ttp over R greater trochanteric bursa, R hip flexors & prox lateral quads, R glutes      Special Tests    Special Tests  Hip Special Tests    Hip Special Tests   Ober's Test      Ober's Test   Findings  Positive    Side  Right;Left      Ambulation/Gait   Assistive device  None    Gait Pattern  Step-through pattern;Antalgic;Decreased weight shift to right;Decreased stance time - right;Decreased hip/knee flexion - right;Trendelenburg    Ambulation Surface  Level;Indoor                Objective measurements completed on examination: See above findings.      [facility] Adult PT Treatment/Exercise - 12/28/17 1025      Modalities   Modalities  Cryotherapy;Electrical Stimulation      Cryotherapy   Number Minutes Cryotherapy  10 Minutes    Cryotherapy Location  Hip   Rt   Type of Cryotherapy  Ice pack      Electrical Stimulation   Electrical Stimulation Location  R lateral hip - trochanteric bursa    Electrical Stimulation Action  IFC    Electrical Stimulation Parameters  80-150 Hz, intenity to pt tol x10'    Electrical Stimulation Goals  Pain             PT Education - 12/28/17 1100    Education Details  PT eval findings & anticipated POC; Community resources for senior based exercise programs    Person(s) Educated  Patient    Methods  Explanation    Comprehension  Verbalized understanding       PT Short Term Goals - 12/28/17 1105      PT SHORT TERM GOAL #1   Status  New    Target Date  01/11/18        PT Long Term Goals - 12/28/17 1106      PT LONG TERM GOAL #1   Title  Independent with ongoing/advanced  HEP     Status  New    Target Date  02/08/18      PT LONG TERM GOAL #2   Title  Pt will report >/= 50% reduction in L lateral hip pain    Status  New    Target Date  02/08/18      PT LONG TERM GOAL #3   Title  L hip strength grossly 4/5 or greater for improved stability    Status  New  Target Date  02/08/18      PT LONG TERM GOAL #4   Title  Pt will report ability to walk long distances w/o need for rollator due to L lateral hip pain    Status  New    Target Date  02/08/18             Plan - 12/28/17 1100    Clinical Impression Statement  Deanna Rowe is a 62 y/o female who presents to OP PT for acute exacerbation of chronic L greater trochanteric bursitis. L bursitis pain first diagnosed in 2016 and was precipitated by residual hip weakness and decreased ROM after L THR in 2015. Pt responded positively to prior episode of PT in 2016 with resolution of the lateral hip pain at the time. More recent bursitis pain has typically been managed by steroid injections but most recent injection in spring of this year did not provide as much relief as prior injections, with pt noting significant increase in pain over past ~3 weeks. Pt currently demonstrates moderate L hip weakness with decreased proximal LE flexibility and exhibits trendelenburg gait with leg length discrepancy. Pain limits walking tolerance, causes difficulty climbing stairs, difficulty getting in/out of car, as well as difficulty with household chores (washing clothes). Deanna Rowe will benefit from skilled PT to address above deficits to allow for improved mobility and gait with decreased pain.    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; h/o CVA/TIA; Parkinsonism    Clinical Presentation  Evolving    Clinical Presentation due to:  acute exacerbation of chronic pain + complex medical history    Clinical Decision Making  Moderate    Rehab Potential  Good     Clinical Impairments Affecting Rehab Potential  s/p B THA    PT Frequency  2x / week    PT Duration  6 weeks    PT Treatment/Interventions  Patient/family education;ADLs/Self Care Home Management;Cryotherapy;Electrical Stimulation;Iontophoresis 4mg /ml Dexamethasone;Moist Heat;Therapeutic exercise;Therapeutic activities;Functional mobility training;Gait training;Stair training;Neuromuscular re-education;Balance training;Manual techniques;Taping;Dry needling;Passive range of motion    Consulted and Agree with Plan of Care  Patient       Patient will benefit from skilled therapeutic intervention in order to improve the following deficits and impairments:  Pain, Increased muscle spasms, Impaired flexibility, Decreased range of motion, Decreased strength, Difficulty walking, Abnormal gait, Decreased activity tolerance, Decreased balance, Decreased mobility  Visit Diagnosis: Pain in left hip  Stiffness of left hip, not elsewhere classified  Difficulty in walking, not elsewhere classified  Other abnormalities of gait and mobility  Muscle weakness (generalized)     Problem List Patient Active Problem List   Diagnosis Date Noted  . Tremor of right hand 03/02/2017  . Parkinsonism ([place]) 03/02/2017  . UTI (urinary tract infection) 12/01/2015  . Diabetes ([place]) 06/03/2015  . 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 Postoperative anemia due to acute blood loss 10/10/2013  . Avascular necrosis of bone of left hip ([place]) 10/09/2013  . OA (osteoarthritis) of hip 10/09/2013  . Cough 07/31/2013  . Shortness of breath 06/18/2013  . Anxiety   . Esophageal dysphagia 09/12/2012  . Depression 02/21/2012  . Stroke [facility]) 02/11/2012    Percival Spanish, PT, MPT 12/28/2017, 8:11 PM  [facility] [address]  [place], [place], [address] Phone: [phone]   Fax:  [phone]  Name: Deanna Rowe  MRN:  [id number] Date of Birth: 02/12/1956

## 2018-01-01 DIAGNOSIS — E042 Nontoxic multinodular goiter: Secondary | ICD-10-CM | POA: Diagnosis not present

## 2018-01-01 DIAGNOSIS — R8290 Unspecified abnormal findings in urine: Secondary | ICD-10-CM | POA: Diagnosis not present

## 2018-01-01 DIAGNOSIS — Z0000 Encounter for general adult medical examination without abnormal findings: Secondary | ICD-10-CM | POA: Diagnosis not present

## 2018-01-01 DIAGNOSIS — I10 Essential (primary) hypertension: Secondary | ICD-10-CM | POA: Diagnosis not present

## 2018-01-01 DIAGNOSIS — Z23 Encounter for immunization: Secondary | ICD-10-CM | POA: Diagnosis not present

## 2018-01-01 DIAGNOSIS — E559 Vitamin D deficiency, unspecified: Secondary | ICD-10-CM | POA: Diagnosis not present

## 2018-01-01 DIAGNOSIS — N183 Chronic kidney disease, stage 3 (moderate): Secondary | ICD-10-CM | POA: Diagnosis not present

## 2018-01-02 ENCOUNTER — Encounter: Payer: Self-pay | Admitting: Cardiology

## 2018-01-02 ENCOUNTER — Ambulatory Visit (INDEPENDENT_AMBULATORY_CARE_PROVIDER_SITE_OTHER): Payer: 59 | Admitting: Cardiology

## 2018-01-02 VITALS — BP 114/70 | HR 62 | Ht 66.0 in | Wt 224.8 lb

## 2018-01-02 DIAGNOSIS — I493 Ventricular premature depolarization: Secondary | ICD-10-CM | POA: Diagnosis not present

## 2018-01-02 NOTE — Progress Notes (Signed)
01/02/2018 Deanna Rowe   [birth year]  [id number]  Primary Physician Leighton Ruff, MD Primary Cardiologist: Dr. Irish Lack   Reason for Visit/CC: F/u for PVCs and medication management  HPI:  Deanna Rowe is a 62 y.o. female who is being seen today for f/u given history of symptomatic PVCs and PACs. She was seen recently for f/u and medication change was made. She is back today to reassess response.   To summarize her history, she has a history of palpitations and tachycardia. In 2015, she was seen by Dr. Irish Lack for dzziness and palpitations. Dr. Irish Lack ordered an event monitor as well as an echocardiogram to evaluate for structural heart disease. Her echo revealed normal left ventricular systolic function, with an EF of 55-60% as well as normal valvular function. Wall motion was asynchronous consistent with intraventricular conduction delay (IVCD orbundle branch block); there were no regional wall motion abnormalities. Doppler parameters were consistent with abnormal left ventricular relaxation (grade 1 diastolic dysfunction). Heart monitor showed sinus rhythm with no arrhythmias. Labs were also obtained during that time. CBC was negative for anemia. Hemoglobin 12.5.  When she presented 04/16/15, she reported pulse rates in the low 200s with exercise and also noted palpitations, exertional dyspnea and chest pain.  I ordered an exercise tolerance to rule out ischemia and exertional arrhythmias as well as a 48-hour Holter monitor.  Stress test was negative for ischemia.  48-hour Holter monitor showed normal sinus rhythm with PVCs and PACs.  I started her on beta-blocker therapy with low-dose metoprolol.  She was seen several weeks in follow-up and reported symptomatic improvement with metoprolol.  Since then, she has continued to do well without any significant recurrence and is here primarily for medication refills after running out of her metoprolol.  She also has hypertension, type 2  diabetes and stage II chronic kidney disease, all followed by her PCP.  She was here recently on 12/05/17 primarily for medication refills. She was doing well. She denied any recurrent palpitations and no other cardiac symptoms, but she did report development of a new resting tremor and has been seen by neurology.   I elected to change her BB from metoprolol to propanolol.  She presents to clinic today for repeat follow-up. She is tolerating propanolol well w/o any side effects. No palpitations. BP is controlled and stable at 114/70. HR 62. Her resting tremor has improved. Her husband also notes that he has noted significant improvement.     Current Meds  Medication Sig  . ACCU-CHEK FASTCLIX LANCETS MISC USE TO CHECK BLOOD SUGAR 3 TIMES A DAY  . ACCU-CHEK GUIDE test strip USE TO CHECK BLOOD SUGAR 3 TIMES PER DAY.  Marland Kitchen albuterol (PROVENTIL HFA;VENTOLIN HFA) 108 (90 BASE) MCG/ACT inhaler Inhale 2 puffs into the lungs as needed for wheezing or shortness of breath (every 4 hours as needed for cough, wheezing and chest tightness).  Marland Kitchen amLODipine (NORVASC) 5 MG tablet Take 5 mg by mouth every morning.   . ARIPiprazole (ABILIFY) 5 MG tablet Take 2 mg by mouth every evening.   Marland Kitchen aspirin EC 81 MG tablet Take 81 mg by mouth every evening.  Marland Kitchen atorvastatin (LIPITOR) 10 MG tablet Take 10 mg by mouth at bedtime.  . beclomethasone (QVAR) 80 MCG/ACT inhaler Inhale 1 puff into the lungs daily as needed.  . benzonatate (TESSALON) 200 MG capsule Take 200 mg by mouth 3 (three) times daily.  Marland Kitchen buPROPion (WELLBUTRIN XL) 150 MG 24 hr tablet Take 450 mg by  mouth every morning.   Marland Kitchen CALCIUM-VITAMIN D PO Take 1 tablet by mouth daily.  . furosemide (LASIX) 20 MG tablet Take 20 mg by mouth daily as needed for edema.   . gabapentin (NEURONTIN) 300 MG capsule Take 300 mg by mouth 2 (two) times daily.   . insulin glargine (LANTUS) 100 UNIT/ML injection Inject 10 Units into the skin every morning.  Marland Kitchen LORazepam (ATIVAN) 1 MG  tablet Take 1 mg by mouth 4 (four) times daily.  Marland Kitchen losartan-hydrochlorothiazide (HYZAAR) 100-12.5 MG tablet Take 1 tablet by mouth daily.  . metFORMIN (GLUMETZA) 1000 MG (MOD) 24 hr tablet Take 1,000 mg by mouth daily with breakfast.  . pantoprazole (PROTONIX) 40 MG tablet Take 40 mg by mouth 2 (two) times daily.   . propranolol (INDERAL) 40 MG tablet Take 1 tablet (40 mg total) by mouth 2 (two) times daily.  Marland Kitchen rOPINIRole (REQUIP) 2 MG tablet Take 2 mg by mouth 2 (two) times daily.  . valACYclovir (VALTREX) 1000 MG tablet Take 500 mg by mouth daily.   . vitamin C (ASCORBIC ACID) 500 MG tablet Take 500 mg by mouth daily.  . Vitamin D, Ergocalciferol, 2000 units CAPS Take 1 capsule by mouth daily. 2000 units daily.  Marland Kitchen vortioxetine HBr (TRINTELLIX) 20 MG TABS Take 20 mg by mouth daily.  Marland Kitchen zolpidem (AMBIEN) 5 MG tablet Take 5 mg by mouth at bedtime.    Allergies  Allergen Reactions  . Fluticasone-Salmeterol Anaphylaxis  . Codeine Nausea And Vomiting  . Fetzima [Levomilnacipran] Nausea And Vomiting  . Nsaids Other (See Comments)    Upset stomach   . Trulicity [Dulaglutide] Nausea And Vomiting    Significant and undesirably rapid (40 lbs) weight loss   . Xanax [Alprazolam] Other (See Comments)    disoriented  . Amoxicillin Rash    Has patient had a PCN reaction causing immediate rash, facial/tongue/throat swelling, SOB or lightheadedness with hypotension: Yes Has patient had a PCN reaction causing severe rash involving mucus membranes or skin necrosis: No Has patient had a PCN reaction that required hospitalization: No Has patient had a PCN reaction occurring within the last 10 years: No If all of the above answers are "NO", then may proceed with Cephalosporin use.   . Pseudoephedrine Hcl Er Palpitations   Past Medical History:  Diagnosis Date  . Anxiety   . Arthritis    L HIP  . Avascular necrosis of hip (HCC)    LEFT  . Depression   . Diabetes mellitus   . Diabetes mellitus, type  II ([place])   . Dry cough   . GERD (gastroesophageal reflux disease)   . Hypertension   . Insomnia    takes Ambien nightly  . Kidney disease   . Obesity   . Parkinsonism ([place]) 03/02/2017  . PONV (postoperative nausea and vomiting)   . TIA (transient ischemic attack) 2012   no residual problems  . Urgency incontinence    Family History  Problem Relation Age of Onset  . Alcohol abuse Brother   . Alcohol abuse Brother   . Alcohol abuse Brother   . Alcohol abuse Brother   . Diabetes Neg Hx    Past Surgical History:  Procedure Laterality Date  . DILATION AND CURETTAGE OF UTERUS    . ESOPHAGEAL MANOMETRY N/A 09/03/2012   Procedure: ESOPHAGEAL MANOMETRY (EM);  Surgeon: Garlan Fair, MD;  [facility];  Service: Endoscopy;  Laterality: N/A;  . ESOPHAGEAL MANOMETRY N/A 06/19/2017   Procedure: ESOPHAGEAL MANOMETRY (  EM);  Surgeon: Clarene Essex, MD;  [facility];  Service: Endoscopy;  Laterality: N/A;  . EXPLORATORY LAPAROTOMY    . JOINT REPLACEMENT  2011   rt total hip  . PILONIDAL CYST / SINUS EXCISION    . TOTAL HIP ARTHROPLASTY Left 10/09/2013   Procedure: LEFT TOTAL HIP ARTHROPLASTY ANTERIOR APPROACH;  Surgeon: [name], MD;  Location: [facility];  Service: Orthopedics;  Laterality: Left;   Social History   Socioeconomic History  . Marital status: Married    Spouse name: Not on file  . Number of children: Not on file  . Years of education: Not on file  . Highest education level: Not on file  Occupational History  . Not on file  Social Needs  . Financial resource strain: Not on file  . Food insecurity:    Worry: Not on file    Inability: Not on file  . Transportation needs:    Medical: Not on file    Non-medical: Not on file  Tobacco Use  . Smoking status: Never Smoker  . Smokeless tobacco: Never Used  Substance and Sexual Activity  . Alcohol use: No    Alcohol/week: 0.0 standard drinks  . Drug use: No  . Sexual activity: Not Currently    Lifestyle  . Physical activity:    Days per week: Not on file    Minutes per session: Not on file  . Stress: Not on file  Relationships  . Social connections:    Talks on phone: Not on file    Gets together: Not on file    Attends religious service: Not on file    Active member of club or organization: Not on file    Attends meetings of clubs or organizations: Not on file    Relationship status: Not on file  . Intimate partner violence:    Fear of current or ex partner: Not on file    Emotionally abused: Not on file    Physically abused: Not on file    Forced sexual activity: Not on file  Other Topics Concern  . Not on file  Social History Narrative   Lives with husband   Caffeine use: none   Right handed      Review of Systems: General: negative for chills, fever, night sweats or weight changes.  Cardiovascular: negative for chest pain, dyspnea on exertion, edema, orthopnea, palpitations, paroxysmal nocturnal dyspnea or shortness of breath Dermatological: negative for rash Respiratory: negative for cough or wheezing Urologic: negative for hematuria Abdominal: negative for nausea, vomiting, diarrhea, bright red blood per rectum, melena, or hematemesis Neurologic: negative for visual changes, syncope, or dizziness All other systems reviewed and are otherwise negative except as noted above.   Physical Exam:  Blood pressure 114/70, pulse 62, height 5\' 6"  (1.676 m), weight 224 lb 12.8 oz (102 kg), SpO2 98 %.  General appearance: alert, cooperative and no distress Neck: no carotid bruit and no JVD Lungs: clear to auscultation bilaterally Heart: regular rate and rhythm, S1, S2 normal, no murmur, click, rub or gallop Extremities: extremities normal, atraumatic, no cyanosis or edema Pulses: 2+ and symmetric Skin: Skin color, texture, turgor normal. No rashes or lesions Neurologic: Grossly normal  EKG not performed -- personally reviewed   ASSESSMENT AND PLAN:   1.  H/O  symptomatic PVCs and PACs: Confirmed on 48 hr monitor in the past. Echo with normal EF. Has been treated with BB with good response and suppression of symptoms. BB recently changed  from metoprolol to propanolol given new development of resting tremor. She is doing well after med change. She is tolerating propanolol well w/o any side effects. No palpitations. BP is controlled and stable at 114/70. HR 62. Her resting tremor has improved. Her husband also notes that he has seen significant improvement w/ tremor. Continue propanolol 40 mg BID. F/u in 1 year or sooner If needed.    Follow-Up w/ Dr. Irish Lack in 1 year.   Marianna Cid Ladoris Gene, MHS [facility] 01/02/2018 1:53 PM

## 2018-01-02 NOTE — Patient Instructions (Signed)
Your physician recommends that you continue on your current medications as directed. Please refer to the Current Medication list given to you today.  Your physician wants you to follow-up in: 1 year with Dr. Varanasi. You will receive a reminder letter in the mail two months in advance. If you don't receive a letter, please call our office to schedule the follow-up appointment.  

## 2018-01-03 ENCOUNTER — Ambulatory Visit: Payer: 59 | Attending: Orthopedic Surgery

## 2018-01-03 DIAGNOSIS — M25652 Stiffness of left hip, not elsewhere classified: Secondary | ICD-10-CM | POA: Insufficient documentation

## 2018-01-03 DIAGNOSIS — M6281 Muscle weakness (generalized): Secondary | ICD-10-CM | POA: Diagnosis not present

## 2018-01-03 DIAGNOSIS — M25551 Pain in right hip: Secondary | ICD-10-CM | POA: Insufficient documentation

## 2018-01-03 DIAGNOSIS — R2689 Other abnormalities of gait and mobility: Secondary | ICD-10-CM | POA: Diagnosis not present

## 2018-01-03 DIAGNOSIS — M25552 Pain in left hip: Secondary | ICD-10-CM | POA: Diagnosis not present

## 2018-01-03 DIAGNOSIS — R262 Difficulty in walking, not elsewhere classified: Secondary | ICD-10-CM | POA: Insufficient documentation

## 2018-01-03 MED FILL — TRINTELLIX 20 MG TABLET: 20 | 90 days supply | Qty: 90 | Fill #1

## 2018-01-03 NOTE — Therapy (Signed)
[facility] [address]  [place], [place], [address] Phone: [phone]   Fax:  [phone]  Physical Therapy Treatment  Patient Details  Name: Deanna Rowe MRN: [id number] Date of Birth: 11-Nov-1955 Referring Provider (PT): Rod Can, MD   Encounter Date: 01/03/2018  PT End of Session - 01/03/18 0853    Visit Number  2    Number of Visits  12    Date for PT Re-Evaluation  02/08/18    Authorization Type  [facility] & Medicare    PT Start Time  0845    PT Stop Time  0928    PT Time Calculation (min)  43 min    Activity Tolerance  Patient tolerated treatment well;Patient limited by pain    Behavior During Therapy  [facility] for tasks assessed/performed       Past Medical History:  Diagnosis Date  . Anxiety   . Arthritis    L HIP  . Avascular necrosis of hip (HCC)    LEFT  . Depression   . Diabetes mellitus   . Diabetes mellitus, type II ([place])   . Dry cough   . GERD (gastroesophageal reflux disease)   . Hypertension   . Insomnia    takes Ambien nightly  . Kidney disease   . Obesity   . Parkinsonism ([place]) 03/02/2017  . PONV (postoperative nausea and vomiting)   . TIA (transient ischemic attack) 2012   no residual problems  . Urgency incontinence     Past Surgical History:  Procedure Laterality Date  . DILATION AND CURETTAGE OF UTERUS    . ESOPHAGEAL MANOMETRY N/A 09/03/2012   Procedure: ESOPHAGEAL MANOMETRY (EM);  Surgeon: Garlan Fair, MD;  [facility];  Service: Endoscopy;  Laterality: N/A;  . ESOPHAGEAL MANOMETRY N/A 06/19/2017   Procedure: ESOPHAGEAL MANOMETRY (EM);  Surgeon: Clarene Essex, MD;  [facility];  Service: Endoscopy;  Laterality: N/A;  . EXPLORATORY LAPAROTOMY    . JOINT REPLACEMENT  2011   rt total hip  . PILONIDAL CYST / SINUS EXCISION    . TOTAL HIP ARTHROPLASTY Left 10/09/2013   Procedure: LEFT TOTAL HIP ARTHROPLASTY ANTERIOR APPROACH;  Surgeon: [name], MD;  [facility];  Service: Orthopedics;  Laterality: Left;    There were no vitals filed for this visit.  Subjective Assessment - 01/03/18 0852    Subjective  Pt. doing well today.  Reports pain levels nearly unchanged over last few days.      Pertinent History  B THA - R 2011 & L 2015    Patient Stated Goals  "no more bursitis"    Currently in Pain?  No/denies    Pain Score  0-No pain   up to 6/10 with walking    Pain Location  Hip    Pain Orientation  Right;Lateral    Pain Descriptors / Indicators  Sharp    Pain Type  Acute pain;Chronic pain    Pain Radiating Towards  none     Pain Onset  1 to 4 weeks ago    Pain Frequency  Intermittent    Aggravating Factors   walking, getting in and out of bed    Pain Relieving Factors  tylenol, ice     Multiple Pain Sites  No                       [facility] Adult PT Treatment/Exercise - 01/03/18 1324  Knee/Hip Exercises: Stretches   Passive Hamstring Stretch  Right;Left;1 rep;30 seconds    Passive Hamstring Stretch Limitations  with strap     ITB Stretch  Right;1 rep;30 seconds   Cues to avoid painful Range    ITB Stretch Limitations  with strap     Piriformis Stretch  Right;Left;30 seconds    Piriformis Stretch Limitations  KTOS      Knee/Hip Exercises: Aerobic   Nustep  Lvl 2, 7 min       Knee/Hip Exercises: Supine   Bridges  10 reps;Both;Strengthening    Bridges Limitations  Cues for hold at top of movement     Bridges with Clamshell  Both;10 reps;Strengthening   with isometric hip abd/ER into yellow TB    Straight Leg Raises  Right;10 reps    Straight Leg Raises Limitations  Good overall technique     Other Supine Knee/Hip Exercises  Alternating hip abd/ER into yellow TB x 10 reps    Cues required for slow pacing on return movement      Manual Therapy   Manual Therapy  Soft tissue mobilization    Manual therapy comments  sidelying with R LE elevated on bolster    Soft tissue mobilization  STM to R  buttocks/piriformis in area of tenderness and lateral thigh musculature; ttp throughout             PT Education - 01/03/18 1102    Education Details  Initial HEP     Person(s) Educated  Patient    Methods  Explanation;Demonstration;Verbal cues;Handout    Comprehension  Verbalized understanding;Returned demonstration;Verbal cues required;Need further instruction       PT Short Term Goals - 01/03/18 0904      PT SHORT TERM GOAL #1   Status  On-going        PT Long Term Goals - 01/03/18 0904      PT LONG TERM GOAL #1   Title  Independent with ongoing/advanced HEP     Status  On-going      PT LONG TERM GOAL #2   Title  Pt will report >/= 50% reduction in L lateral hip pain    Status  On-going      PT LONG TERM GOAL #3   Title  L hip strength grossly 4/5 or greater for improved stability    Status  On-going      PT LONG TERM GOAL #4   Title  Pt will report ability to walk long distances w/o need for rollator due to L lateral hip pain    Status  On-going            Plan - 01/03/18 0903    Clinical Impression Statement  Pt. reporting she is occasionally performing MD HEP consisting of SLR and bridge activities.  Session focusing on formation and demonstration of hip/LE stretching and strengthening HEP.  Pt. requiring occasional cueing with stretching activities for proper positioning however ended visit with pt. verbalizing understanding.  Only mild rise in R hip pain with activities today, which quickly recovered with rest.  Reports most significant rise in R hip pain is still with walking activities.  Will monitor response to updated HEP in coming visit.      PT Treatment/Interventions  Patient/family education;ADLs/Self Care Home Management;Cryotherapy;Electrical Stimulation;Iontophoresis 4mg /ml Dexamethasone;Moist Heat;Therapeutic exercise;Therapeutic activities;Functional mobility training;Gait training;Stair training;Neuromuscular re-education;Balance  training;Manual techniques;Taping;Dry needling;Passive range of motion    Consulted and Agree with Plan of Care  Patient  Patient will benefit from skilled therapeutic intervention in order to improve the following deficits and impairments:  Pain, Increased muscle spasms, Impaired flexibility, Decreased range of motion, Decreased strength, Difficulty walking, Abnormal gait, Decreased activity tolerance, Decreased balance, Decreased mobility  Visit Diagnosis: Pain in left hip  Stiffness of left hip, not elsewhere classified  Difficulty in walking, not elsewhere classified  Other abnormalities of gait and mobility  Muscle weakness (generalized)     Problem List Patient Active Problem List   Diagnosis Date Noted  . Tremor of right hand 03/02/2017  . Parkinsonism ([place]) 03/02/2017  . UTI (urinary tract infection) 12/01/2015  . Diabetes ([place]) 06/03/2015  . 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 Postoperative anemia due to acute blood loss 10/10/2013  . Avascular necrosis of bone of left hip ([place]) 10/09/2013  . OA (osteoarthritis) of hip 10/09/2013  . Cough 07/31/2013  . Shortness of breath 06/18/2013  . Anxiety   . Esophageal dysphagia 09/12/2012  . Depression 02/21/2012  . Stroke [facility]) 02/11/2012    Bess Harvest, PTA 01/03/18 11:10 AM   [facility] [address]  [place], [place], [address] Phone: [phone]   Fax:  [phone]  Name: Deanna Rowe MRN: [id number] Date of Birth: 1955/10/11

## 2018-01-04 ENCOUNTER — Other Ambulatory Visit: Payer: Self-pay | Admitting: Family Medicine

## 2018-01-04 DIAGNOSIS — Z1231 Encounter for screening mammogram for malignant neoplasm of breast: Secondary | ICD-10-CM

## 2018-01-04 MED FILL — ACCU-CHEK FASTCLIX LANCETS: 34 days supply | Qty: 102 | Fill #2

## 2018-01-04 MED FILL — VALACYCLOVIR HCL 500 MG TAB: 500 | 90 days supply | Qty: 90 | Fill #0

## 2018-01-05 ENCOUNTER — Ambulatory Visit: Payer: 59 | Admitting: Physical Therapy

## 2018-01-05 ENCOUNTER — Encounter: Payer: Self-pay | Admitting: Physical Therapy

## 2018-01-05 DIAGNOSIS — R2689 Other abnormalities of gait and mobility: Secondary | ICD-10-CM

## 2018-01-05 DIAGNOSIS — M25652 Stiffness of left hip, not elsewhere classified: Secondary | ICD-10-CM | POA: Diagnosis not present

## 2018-01-05 DIAGNOSIS — M6281 Muscle weakness (generalized): Secondary | ICD-10-CM

## 2018-01-05 DIAGNOSIS — F251 Schizoaffective disorder, depressive type: Secondary | ICD-10-CM | POA: Diagnosis not present

## 2018-01-05 DIAGNOSIS — M25552 Pain in left hip: Secondary | ICD-10-CM | POA: Diagnosis not present

## 2018-01-05 DIAGNOSIS — R262 Difficulty in walking, not elsewhere classified: Secondary | ICD-10-CM | POA: Diagnosis not present

## 2018-01-05 DIAGNOSIS — M25551 Pain in right hip: Secondary | ICD-10-CM | POA: Diagnosis not present

## 2018-01-05 MED FILL — metFORMIN HCL 500 MG TABS: 500 | 30 days supply | Qty: 60 | Fill #1

## 2018-01-05 NOTE — Therapy (Signed)
[facility] [address]  [place], [place], [address] Phone: [phone]   Fax:  [phone]  Physical Therapy Treatment  Patient Details  Name: Deanna Rowe MRN: [id number] Date of Birth: 1956-02-12 Referring Provider (PT): Rod Can, MD   Encounter Date: 01/05/2018  PT End of Session - 01/05/18 0843    Visit Number  3    Number of Visits  12    Date for PT Re-Evaluation  02/08/18    Authorization Type  [facility] & [facility]    PT Start Time  [phone]    PT Stop Time  0930    PT Time Calculation (min)  47 min    Activity Tolerance  Patient tolerated treatment well    Behavior During Therapy  [facility] for tasks assessed/performed       Past Medical History:  Diagnosis Date  . Anxiety   . Arthritis    L HIP  . Avascular necrosis of hip (HCC)    LEFT  . Depression   . Diabetes mellitus   . Diabetes mellitus, type II ([place])   . Dry cough   . GERD (gastroesophageal reflux disease)   . Hypertension   . Insomnia    takes Ambien nightly  . Kidney disease   . Obesity   . Parkinsonism ([place]) 03/02/2017  . PONV (postoperative nausea and vomiting)   . TIA (transient ischemic attack) 2012   no residual problems  . Urgency incontinence     Past Surgical History:  Procedure Laterality Date  . DILATION AND CURETTAGE OF UTERUS    . ESOPHAGEAL MANOMETRY N/A 09/03/2012   Procedure: ESOPHAGEAL MANOMETRY (EM);  Surgeon: Garlan Fair, MD;  [facility];  Service: Endoscopy;  Laterality: N/A;  . ESOPHAGEAL MANOMETRY N/A 06/19/2017   Procedure: ESOPHAGEAL MANOMETRY (EM);  Surgeon: Clarene Essex, MD;  [facility];  Service: Endoscopy;  Laterality: N/A;  . EXPLORATORY LAPAROTOMY    . JOINT REPLACEMENT  2011   rt total hip  . PILONIDAL CYST / SINUS EXCISION    . TOTAL HIP ARTHROPLASTY Left 10/09/2013   Procedure: LEFT TOTAL HIP ARTHROPLASTY ANTERIOR APPROACH;  Surgeon: [name], MD;  [facility];  Service: Orthopedics;  Laterality: Left;    There were no vitals filed for this visit.  Subjective Assessment - 01/05/18 0845    Subjective  Pt c/o feeling stiff today. Thinks it may be due to the fact that she has been cleaning.    Pertinent History  B THA - R 2011 & L 2015    Patient Stated Goals  "no more bursitis"    Currently in Pain?  Yes    Pain Score  4     Pain Location  Hip    Pain Orientation  Right;Lateral    Pain Onset  1 to 4 weeks ago                       [facility] Adult PT Treatment/Exercise - 01/05/18 0843      Exercises   Exercises  Knee/Hip      Knee/Hip Exercises: Stretches   Passive Hamstring Stretch  Right;30 seconds;2 reps    Passive Hamstring Stretch Limitations  supine with strap    ITB Stretch  Right;30 seconds;2 reps    ITB Stretch Limitations  supine with strap    Piriformis Stretch  Right;30 seconds;2 reps    Piriformis Stretch Limitations  supine  KTOS      Knee/Hip Exercises: Aerobic   Nustep  L3 x 6 min      Knee/Hip Exercises: Supine   Bridges  Both;10 reps;Strengthening   3-5 sec hold   Bridges Limitations  + yellow TB hip ABD isometric    Other Supine Knee/Hip Exercises  Alternating hip abd/ER with yellow TB x 10    Other Supine Knee/Hip Exercises  Brace marching with yellow TB x10      Modalities   Modalities  Iontophoresis      Iontophoresis   Type of Iontophoresis  Dexamethasone    Location  R greater trochanteric bursa    Dose  80 mA-min; 1.0 mL    Time  4-6 hr patch (#1 of 6)      Manual Therapy   Manual Therapy  Soft tissue mobilization;Myofascial release;Other (comment)    Manual therapy comments  sidelying with R LE elevated on bolster    Soft tissue mobilization  STM to R glutes & piriformis     Myofascial Release  manual TPR to R lateral piriformis    Other Manual Therapy  Instructed pt in self-STM to glutes/pirifmormis using small ball on wall & lateral quads/ITB/HS usin rolling pin or similar  cylindrical object             PT Education - 01/05/18 0925    Education Details  Self STM/release to glutes, piriformis and lateral thigh; Ionto patch precautions/contraindications and wearing instructions    Person(s) Educated  Patient    Methods  Explanation;Demonstration;Handout    Comprehension  Verbalized understanding       PT Short Term Goals - 01/03/18 0904      PT SHORT TERM GOAL #1   Status  On-going        PT Long Term Goals - 01/03/18 0904      PT LONG TERM GOAL #1   Title  Independent with ongoing/advanced HEP     Status  On-going      PT LONG TERM GOAL #2   Title  Pt will report >/= 50% reduction in L lateral hip pain    Status  On-going      PT LONG TERM GOAL #3   Title  L hip strength grossly 4/5 or greater for improved stability    Status  On-going      PT LONG TERM GOAL #4   Title  Pt will report ability to walk long distances w/o need for rollator due to L lateral hip pain    Status  On-going            Plan - 01/05/18 0848    Clinical Impression Statement  Reviewed initial HEP with minor corrections necessary along with clarification of proper hold times with stretches vs strengthening exercises as well as cues for abdominal bracing during strengthening exercises. Significant ttp and increased muscle tension persists in R buttock esp over lateral piriformis which was addressed with manual STM & TPR followed by instruction in how to perform similar self-release at home. Treatment concluded with application of ionto patch to R greater trochanteric bursa as pt notes benefit from this during prior PT episode for same issues.    Rehab Potential  Good    Clinical Impairments Affecting Rehab Potential  s/p B THA    PT Treatment/Interventions  Patient/family education;ADLs/Self Care Home Management;Cryotherapy;Electrical Stimulation;Iontophoresis 4mg /ml Dexamethasone;Moist Heat;Therapeutic exercise;Therapeutic activities;Functional mobility  training;Gait training;Stair training;Neuromuscular re-education;Balance training;Manual techniques;Taping;Dry needling;Passive range of motion    Consulted  and Agree with Plan of Care  Patient       Patient will benefit from skilled therapeutic intervention in order to improve the following deficits and impairments:  Pain, Increased muscle spasms, Impaired flexibility, Decreased range of motion, Decreased strength, Difficulty walking, Abnormal gait, Decreased activity tolerance, Decreased balance, Decreased mobility  Visit Diagnosis: Pain in left hip  Stiffness of left hip, not elsewhere classified  Difficulty in walking, not elsewhere classified  Other abnormalities of gait and mobility  Muscle weakness (generalized)     Problem List Patient Active Problem List   Diagnosis Date Noted  . Tremor of right hand 03/02/2017  . Parkinsonism ([place]) 03/02/2017  . UTI (urinary tract infection) 12/01/2015  . Diabetes ([place]) 06/03/2015  . 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 Postoperative anemia due to acute blood loss 10/10/2013  . Avascular necrosis of bone of left hip ([place]) 10/09/2013  . OA (osteoarthritis) of hip 10/09/2013  . Cough 07/31/2013  . Shortness of breath 06/18/2013  . Anxiety   . Esophageal dysphagia 09/12/2012  . Depression 02/21/2012  . Stroke [facility]) 02/11/2012    Percival Spanish, PT, MPT 01/05/2018, 10:55 AM  [facility] [address]  [place], [place], [address] Phone: [phone]   Fax:  [phone]  Name: Deanna Rowe MRN: [id number] Date of Birth: Apr 26, 1955

## 2018-01-05 NOTE — Patient Instructions (Signed)

## 2018-01-08 MED FILL — BENZONATATE 200 MG CAPS: 200 | 10 days supply | Qty: 30 | Fill #0

## 2018-01-15 ENCOUNTER — Ambulatory Visit (INDEPENDENT_AMBULATORY_CARE_PROVIDER_SITE_OTHER): Payer: 59 | Admitting: Neurology

## 2018-01-15 ENCOUNTER — Encounter: Payer: Self-pay | Admitting: Neurology

## 2018-01-15 VITALS — BP 131/77 | HR 65 | Ht 66.0 in | Wt 228.0 lb

## 2018-01-15 DIAGNOSIS — G2581 Restless legs syndrome: Secondary | ICD-10-CM

## 2018-01-15 DIAGNOSIS — G2111 Neuroleptic induced parkinsonism: Secondary | ICD-10-CM

## 2018-01-15 HISTORY — DX: Restless legs syndrome: G25.81

## 2018-01-15 MED ORDER — CARBIDOPA-LEVODOPA 25-100 MG PO TABS: ORAL_TABLET | ORAL | 3 refills | Status: DC

## 2018-01-15 MED FILL — CARBIDOPA/LEVO 25/100 TAB: 25-100 | 30 days supply | Qty: 90 | Fill #0

## 2018-01-15 NOTE — Progress Notes (Signed)
Reason for visit: Parkinsonism  Deanna Rowe is an 62 y.o. female  History of present illness:  Deanna Rowe is a 62 year old right-handed black female with a history of parkinsonism, the patient has been on Abilify but the dose was reduced to 2 mg at night.  The patient has continued to progress with her symptoms, she has some primarily right-sided symptoms of tremor, but she also has a head nod tremor that is quite prominent.  The patient has had increasing problems with gait instability, she has not had any falls but she will bump into things frequently.  She has restless leg syndrome that is not fully subdued by the Requip dosing at 2 mg twice daily.  The patient reports no troubles with swallowing or choking.  She comes to the office today for an evaluation.  Past Medical History:  Diagnosis Date  . Anxiety   . Arthritis    L HIP  . Avascular necrosis of hip (HCC)    LEFT  . Depression   . Diabetes mellitus   . Diabetes mellitus, type II ([place])   . Dry cough   . GERD (gastroesophageal reflux disease)   . Hypertension   . Insomnia    takes Ambien nightly  . Kidney disease   . Obesity   . Parkinsonism ([place]anna Rowe) 03/02/2017  . PONV (postoperative nausea and vomiting)   . TIA (transient ischemic attack) 2012   no residual problems  . Urgency incontinence     Past Surgical History:  Procedure Laterality Date  . DILATION AND CURETTAGE OF UTERUS    . ESOPHAGEAL MANOMETRY N/A 09/03/2012   Procedure: ESOPHAGEAL MANOMETRY (EM);  Surgeon: Garlan Fair, MD;  [facility];  Service: Endoscopy;  Laterality: N/A;  . ESOPHAGEAL MANOMETRY N/A 06/19/2017   Procedure: ESOPHAGEAL MANOMETRY (EM);  Surgeon: Clarene Essex, MD;  [facility];  Service: Endoscopy;  Laterality: N/A;  . EXPLORATORY LAPAROTOMY    . JOINT REPLACEMENT  2011   rt total hip  . PILONIDAL CYST / SINUS EXCISION    . TOTAL HIP ARTHROPLASTY Left 10/09/2013   Procedure: LEFT TOTAL HIP ARTHROPLASTY  ANTERIOR APPROACH;  Surgeon: [name], MD;  [facility];  Service: Orthopedics;  Laterality: Left;    Family History  Problem Relation Age of Onset  . Alcohol abuse Brother   . Alcohol abuse Brother   . Alcohol abuse Brother   . Alcohol abuse Brother   . Diabetes Neg Hx     Social history:  reports that she has never smoked. She has never used smokeless tobacco. She reports that she does not drink alcohol or use drugs.    Allergies  Allergen Reactions  . Fluticasone-Salmeterol Anaphylaxis  . Codeine Nausea And Vomiting  . Fetzima [Levomilnacipran] Nausea And Vomiting  . Nsaids Other (See Comments)    Upset stomach   . Trulicity [Dulaglutide] Nausea And Vomiting    Significant and undesirably rapid (40 lbs) weight loss   . Xanax [Alprazolam] Other (See Comments)    disoriented  . Amoxicillin Rash    Has patient had a PCN reaction causing immediate rash, facial/tongue/throat swelling, SOB or lightheadedness with hypotension: Yes Has patient had a PCN reaction causing severe rash involving mucus membranes or skin necrosis: No Has patient had a PCN reaction that required hospitalization: No Has patient had a PCN reaction occurring within the last 10 years: No If all of the above answers are "NO", then may proceed with Cephalosporin use.   Marland Kitchen  Pseudoephedrine Hcl Er Palpitations    Medications:  Prior to Admission medications   Medication Sig Start Date End Date Taking? Authorizing Provider  ACCU-CHEK FASTCLIX LANCETS MISC USE TO CHECK BLOOD SUGAR 3 TIMES A DAY 10/25/17  Yes Renato Shin, MD  ACCU-CHEK GUIDE test strip USE TO CHECK BLOOD SUGAR 3 TIMES PER DAY. 02/02/17  Yes Renato Shin, MD  albuterol (PROVENTIL HFA;VENTOLIN HFA) 108 (90 BASE) MCG/ACT inhaler Inhale 2 puffs into the lungs as needed for wheezing or shortness of breath (every 4 hours as needed for cough, wheezing and chest tightness).   Yes [provider]  amLODipine (NORVASC) 5 MG tablet Take 5  mg by mouth every morning.    Yes [provider]  ARIPiprazole (ABILIFY) 2 MG tablet Take 2 mg by mouth daily.   Yes [provider]  aspirin EC 81 MG tablet Take 81 mg by mouth every evening.   Yes [provider]  atorvastatin (LIPITOR) 10 MG tablet Take 10 mg by mouth at bedtime. 12/05/17  Yes [provider]  beclomethasone (QVAR) 80 MCG/ACT inhaler Inhale 1 puff into the lungs daily as needed.   Yes [provider]  benzonatate (TESSALON) 200 MG capsule Take 200 mg by mouth 3 (three) times daily. 12/23/17  Yes [provider]  buPROPion (WELLBUTRIN XL) 150 MG 24 hr tablet Take 450 mg by mouth every morning.    Yes [provider]  CALCIUM-VITAMIN D PO Take 1 tablet by mouth daily.   Yes [provider]  furosemide (LASIX) 20 MG tablet Take 20 mg by mouth daily as needed for edema.    Yes Leighton Ruff, MD  gabapentin (NEURONTIN) 300 MG capsule Take 300 mg by mouth 2 (two) times daily.    Yes [provider]  insulin glargine (LANTUS) 100 UNIT/ML injection Inject 10 Units into the skin every morning.   Yes [provider]  LORazepam (ATIVAN) 1 MG tablet Take 1 mg by mouth 4 (four) times daily.   Yes Ricard Dillon, MD  losartan-hydrochlorothiazide [facility]) 100-12.5 MG tablet Take 1 tablet by mouth daily.   Yes [provider]  metFORMIN (GLUMETZA) 1000 MG (MOD) 24 hr tablet Take 1,000 mg by mouth daily with breakfast.   Yes [provider]  pantoprazole (PROTONIX) 40 MG tablet Take 40 mg by mouth 2 (two) times daily.    Yes [provider]  propranolol (INDERAL) 40 MG tablet Take 1 tablet (40 mg total) by mouth 2 (two) times daily. 12/05/17  Yes Simmons, Brittainy M, PA-C  rOPINIRole (REQUIP) 2 MG tablet Take 2 mg by mouth 2 (two) times daily. 03/16/15  Yes [provider]  valACYclovir (VALTREX) 1000 MG tablet Take 500 mg by mouth daily.    Yes [provider]  vitamin C (ASCORBIC ACID) 500 MG tablet Take 500 mg by mouth daily.   Yes [provider]  Vitamin D, Ergocalciferol, 2000 units CAPS Take 1 capsule by mouth daily. 2000 units daily.   Yes [provider]  vortioxetine HBr (TRINTELLIX) 20 MG TABS Take 20 mg by mouth daily.   Yes [provider]  zolpidem (AMBIEN) 5 MG tablet Take 5 mg by mouth at bedtime.    Yes [provider]  carbidopa-levodopa (SINEMET IR) 25-100 MG tablet 1/2 tablet three times a day for 4 weeks, then take one tablet three times a day 01/15/18   Kathrynn Ducking, MD    ROS:  Out of a  complete 14 system review of symptoms, the patient complains only of the following symptoms, and all other reviewed systems are negative.  Fatigue, excessive sweating Hearing loss, ringing in the ears Cough Swollen abdomen, constipation Restless legs, snoring Food allergies Incontinence of the bladder, frequency of urination, urinary urgency Joint pain, back pain Dizziness, tremors Anxiety  Blood pressure 131/77, pulse 65, height 5\' 6"  (1.676 m), weight 228 lb (103.4 kg).  Physical Exam  General: The patient is alert and cooperative at the time of the examination.  This patient is moderately obese.  Skin: No significant peripheral edema is noted.   Neurologic Exam  Mental status: The patient is alert and oriented x 3 at the time of the examination. The patient has apparent normal recent and remote memory, with an apparently normal attention span and concentration ability.   Cranial nerves: Facial symmetry is present. Speech is normal, no aphasia or dysarthria is noted. Extraocular movements are full. Visual fields are full.  Mild masking the face is seen.  A head nod tremor is seen.  Motor: The patient has good strength in all 4 extremities.  Sensory examination: Soft touch sensation is symmetric on the face, arms, and legs.  Coordination: The patient has good  finger-nose-finger and heel-to-shin bilaterally.  Resting tremors on the arms are not readily apparent today.  Gait and station: The patient has the ability to arise from a seated position with arms crossed.  Once up, the patient can walk independently, there is decreased arm swing on the right.  Tandem gait is unsteady.  Romberg is negative. No drift is seen.  Reflexes: Deep tendon reflexes are symmetric.   Assessment/Plan:  1.  Parkinson's disease  2.  Restless leg syndrome  The patient has continued to progress some with a lower dose of the Abilify, the patient likely has true Parkinson's disease.  The patient will be started on Sinemet taking the 25/100 mg tablets, 1/2 tablet 3 times daily for 4 weeks and then take 1 full tablet 3 times daily.  Hopefully this will help the restless leg syndrome as well.  The patient will follow-up in 6 months.  Jill Alexanders MD 01/15/2018 9:57 AM  [facility] [address] [place], [place] [address]  Phone [phone] Fax [phone]

## 2018-01-15 NOTE — Patient Instructions (Signed)
We will start Sinemet 25/100 mg 1/2 tablet three times a day for one month and then go to one full tablet three times a day.   Sinemet (carbidopa) may result in confusion or hallucinations, drowsiness, nausea, or dizziness. If any significant side effects are noted, please contact our office. Sinemet may not be well absorbed when taken with high protein meals, if tolerated it is best to take 30-45 minutes before you eat.

## 2018-01-16 ENCOUNTER — Ambulatory Visit: Payer: 59 | Admitting: Physical Therapy

## 2018-01-16 DIAGNOSIS — M25551 Pain in right hip: Secondary | ICD-10-CM

## 2018-01-16 DIAGNOSIS — M25552 Pain in left hip: Secondary | ICD-10-CM

## 2018-01-16 DIAGNOSIS — M6281 Muscle weakness (generalized): Secondary | ICD-10-CM

## 2018-01-16 DIAGNOSIS — R262 Difficulty in walking, not elsewhere classified: Secondary | ICD-10-CM | POA: Diagnosis not present

## 2018-01-16 DIAGNOSIS — R2689 Other abnormalities of gait and mobility: Secondary | ICD-10-CM | POA: Diagnosis not present

## 2018-01-16 DIAGNOSIS — M25652 Stiffness of left hip, not elsewhere classified: Secondary | ICD-10-CM | POA: Diagnosis not present

## 2018-01-16 NOTE — Therapy (Signed)
[facility] [address]  [place], [place], [address] Phone: [phone]   Fax:  [phone]  Physical Therapy Treatment  Patient Details  Name: Deanna Rowe DOBLE MRN: [id number] Date of Birth: 1955-09-26 Referring Provider (PT): Rod Can, MD   Encounter Date: 01/16/2018  PT End of Session - 01/16/18 1328    Visit Number  4    Number of Visits  12    Date for PT Re-Evaluation  02/08/18    Authorization Type  [facility] & Medicare    PT Start Time  0845    PT Stop Time  0930    PT Time Calculation (min)  45 min    Activity Tolerance  Patient tolerated treatment well    Behavior During Therapy  [facility] for tasks assessed/performed       Past Medical History:  Diagnosis Date  . Anxiety   . Arthritis    L HIP  . Avascular necrosis of hip (HCC)    LEFT  . Depression   . Diabetes mellitus   . Diabetes mellitus, type II ([place])   . Dry cough   . GERD (gastroesophageal reflux disease)   . Hypertension   . Insomnia    takes Ambien nightly  . Kidney disease   . Obesity   . Parkinsonism ([place]) 03/02/2017  . PONV (postoperative nausea and vomiting)   . RLS (restless legs syndrome) 01/15/2018  . TIA (transient ischemic attack) 2012   no residual problems  . Urgency incontinence     Past Surgical History:  Procedure Laterality Date  . DILATION AND CURETTAGE OF UTERUS    . ESOPHAGEAL MANOMETRY N/A 09/03/2012   Procedure: ESOPHAGEAL MANOMETRY (EM);  Surgeon: Garlan Fair, MD;  [facility];  Service: Endoscopy;  Laterality: N/A;  . ESOPHAGEAL MANOMETRY N/A 06/19/2017   Procedure: ESOPHAGEAL MANOMETRY (EM);  Surgeon: Clarene Essex, MD;  [facility];  Service: Endoscopy;  Laterality: N/A;  . EXPLORATORY LAPAROTOMY    . JOINT REPLACEMENT  2011   rt total hip  . PILONIDAL CYST / SINUS EXCISION    . TOTAL HIP ARTHROPLASTY Left 10/09/2013   Procedure: LEFT TOTAL HIP ARTHROPLASTY ANTERIOR APPROACH;   Surgeon: [name], MD;  [facility];  Service: Orthopedics;  Laterality: Left;    There were no vitals filed for this visit.  Subjective Assessment - 01/16/18 1324    Subjective  Pt relays her Rt hip is bothering her today    Currently in Pain?  Yes    Pain Score  4     Pain Location  Hip    Pain Orientation  Right;Lateral    Pain Descriptors / Indicators  [facility] Adult PT Treatment/Exercise - 01/16/18 0001      Exercises   Exercises  Knee/Hip      Knee/Hip Exercises: Stretches   Passive Hamstring Stretch  Right;3 reps;30 seconds    Passive Hamstring Stretch Limitations  supine with strap    ITB Stretch  Right;3 reps;30 seconds    ITB Stretch Limitations  supine with strap    Piriformis Stretch  Right;3 reps;30 seconds    Piriformis Stretch Limitations  supine KTOS      Knee/Hip Exercises: Aerobic   Nustep  L3 x 6 min      Knee/Hip Exercises: Supine   Constance Haw  Both;Strengthening;15 reps    Bridges Limitations  + yellow TB hip ABD isometric    Other Supine Knee/Hip Exercises  Alternating hip abd/ER with yellow TB x 15    Other Supine Knee/Hip Exercises  Brace marching with yellow TB x15      Knee/Hip Exercises: Sidelying   Clams  Lt sidelying for Rt hip X 15      Modalities   Modalities  Iontophoresis      Iontophoresis   Type of Iontophoresis  Dexamethasone    Location  R greater trochanteric bursa    Dose  80 mA-min; 1.0 mL    Time  4-6 hr patch (#2 of 6)      Manual Therapy   Manual Therapy  Soft tissue mobilization;Myofascial release;Other (comment)    Manual therapy comments  sidelying with R LE elevated on bolster    Soft tissue mobilization  STM and IASTM with roller stick to R glutes & piriformis     Myofascial Release  manual TPR to R lateral piriformis               PT Short Term Goals - 01/03/18 0904      PT SHORT TERM GOAL #1   Status  On-going        PT Long Term Goals - 01/03/18  0904      PT LONG TERM GOAL #1   Title  Independent with ongoing/advanced HEP     Status  On-going      PT LONG TERM GOAL #2   Title  Pt will report >/= 50% reduction in L lateral hip pain    Status  On-going      PT LONG TERM GOAL #3   Title  L hip strength grossly 4/5 or greater for improved stability    Status  On-going      PT LONG TERM GOAL #4   Title  Pt will report ability to walk long distances w/o need for rollator due to L lateral hip pain    Status  On-going            Plan - 01/16/18 1329    Clinical Impression Statement  Pt was able to progress reps today with threx with good overall tolerance but she does relay she had a good workout in her Rt hip. MT continued to reduce pain and soft tissue restrictions in right hip. She relays IONTO helped so this was again applied at end of session to further reduce pain and inflamation. PT will contnue to progress as able.    Rehab Potential  Good    Clinical Impairments Affecting Rehab Potential  s/p B THA    PT Frequency  2x / week    PT Duration  6 weeks    PT Treatment/Interventions  Patient/family education;ADLs/Self Care Home Management;Cryotherapy;Electrical Stimulation;Iontophoresis 4mg /ml Dexamethasone;Moist Heat;Therapeutic exercise;Therapeutic activities;Functional mobility training;Gait training;Stair training;Neuromuscular re-education;Balance training;Manual techniques;Taping;Dry needling;Passive range of motion    Consulted and Agree with Plan of Care  Patient       Patient will benefit from skilled therapeutic intervention in order to improve the following deficits and impairments:  Pain, Increased muscle spasms, Impaired flexibility, Decreased range of motion, Decreased strength, Difficulty walking, Abnormal gait, Decreased activity tolerance, Decreased balance, Decreased mobility  Visit Diagnosis: Pain in left hip  Pain in right hip  Difficulty in walking, not elsewhere classified  Other abnormalities  of gait and mobility  Muscle weakness (generalized)     Problem List Patient Active  Problem List   Diagnosis Date Noted  . RLS (restless legs syndrome) 01/15/2018  . Tremor of right hand 03/02/2017  . Parkinsonism ([place]) 03/02/2017  . UTI (urinary tract infection) 12/01/2015  . Diabetes ([place]) 06/03/2015  . 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 Postoperative anemia due to acute blood loss 10/10/2013  . Avascular necrosis of bone of left hip ([place]) 10/09/2013  . OA (osteoarthritis) of hip 10/09/2013  . Cough 07/31/2013  . Shortness of breath 06/18/2013  . Anxiety   . Esophageal dysphagia 09/12/2012  . Depression 02/21/2012  . Stroke [facility]) 02/11/2012    Debbe Odea, PT, DPT 01/16/2018, 1:33 PM  [facility] [address]  [place], [place], [address] Phone: [phone]   Fax:  [phone]  Name: JAZMINN POMALES MRN: [id number] Date of Birth: 1955/06/29

## 2018-01-17 MED FILL — LORazepam 1 MG TABS: 1 | 90 days supply | Qty: 360 | Fill #1

## 2018-01-17 MED FILL — LOSARTAN-HCTZ 100-12.5 MG T: 100-12.5 | 90 days supply | Qty: 90 | Fill #0

## 2018-01-17 MED FILL — PANTOPRAZOLE SOD DR 40 MG T: 40 | 45 days supply | Qty: 90 | Fill #0

## 2018-01-19 ENCOUNTER — Ambulatory Visit: Payer: 59 | Admitting: Physical Therapy

## 2018-01-19 ENCOUNTER — Encounter: Payer: Self-pay | Admitting: Physical Therapy

## 2018-01-19 DIAGNOSIS — M6281 Muscle weakness (generalized): Secondary | ICD-10-CM

## 2018-01-19 DIAGNOSIS — M25551 Pain in right hip: Secondary | ICD-10-CM | POA: Diagnosis not present

## 2018-01-19 DIAGNOSIS — R262 Difficulty in walking, not elsewhere classified: Secondary | ICD-10-CM | POA: Diagnosis not present

## 2018-01-19 DIAGNOSIS — M25652 Stiffness of left hip, not elsewhere classified: Secondary | ICD-10-CM | POA: Diagnosis not present

## 2018-01-19 DIAGNOSIS — M25552 Pain in left hip: Secondary | ICD-10-CM | POA: Diagnosis not present

## 2018-01-19 DIAGNOSIS — R2689 Other abnormalities of gait and mobility: Secondary | ICD-10-CM | POA: Diagnosis not present

## 2018-01-19 MED FILL — ACCU-CHEK GUIDE STRP: 90 days supply | Qty: 300 | Fill #2

## 2018-01-19 NOTE — Therapy (Signed)
[facility] [address]  [place], [place], [address] Phone: [phone]   Fax:  [phone]  Physical Therapy Treatment  Patient Details  Name: Deanna Rowe MRN: [id number] Date of Birth: 08-29-1955 Referring Provider (PT): Rod Can, MD   Encounter Date: 01/19/2018  PT End of Session - 01/19/18 0925    Visit Number  5    Number of Visits  12    Date for PT Re-Evaluation  02/08/18    Authorization Type  [facility]    PT Start Time  0925    PT Stop Time  1026    PT Time Calculation (min)  61 min    Activity Tolerance  Patient tolerated treatment well    Behavior During Therapy  [facility] for tasks assessed/performed       Past Medical History:  Diagnosis Date  . Anxiety   . Arthritis    L HIP  . Avascular necrosis of hip (HCC)    LEFT  . Depression   . Diabetes mellitus   . Diabetes mellitus, type II ([place])   . Dry cough   . GERD (gastroesophageal reflux disease)   . Hypertension   . Insomnia    takes Ambien nightly  . Kidney disease   . Obesity   . Parkinsonism ([place]) 03/02/2017  . PONV (postoperative nausea and vomiting)   . RLS (restless legs syndrome) 01/15/2018  . TIA (transient ischemic attack) 2012   no residual problems  . Urgency incontinence     Past Surgical History:  Procedure Laterality Date  . DILATION AND CURETTAGE OF UTERUS    . ESOPHAGEAL MANOMETRY N/A 09/03/2012   Procedure: ESOPHAGEAL MANOMETRY (EM);  Surgeon: Garlan Fair, MD;  [facility];  Service: Endoscopy;  Laterality: N/A;  . ESOPHAGEAL MANOMETRY N/A 06/19/2017   Procedure: ESOPHAGEAL MANOMETRY (EM);  Surgeon: Clarene Essex, MD;  [facility];  Service: Endoscopy;  Laterality: N/A;  . EXPLORATORY LAPAROTOMY    . JOINT REPLACEMENT  2011   rt total hip  . PILONIDAL CYST / SINUS EXCISION    . TOTAL HIP ARTHROPLASTY Left 10/09/2013   Procedure: LEFT TOTAL HIP ARTHROPLASTY ANTERIOR APPROACH;   Surgeon: [name], MD;  [facility];  Service: Orthopedics;  Laterality: Left;    There were no vitals filed for this visit.  Subjective Assessment - 01/19/18 0930    Subjective  Pt reporting increased pain after last therapy session but not sure what may have triggered it.    Pertinent History  B THA - R 2011 & L 2015    Patient Stated Goals  "no more bursitis"    Currently in Pain?  Yes    Pain Score  3     Pain Location  Hip    Pain Orientation  Right;Lateral    Pain Descriptors / Indicators  --   "vague mild pain"   Pain Type  Acute pain;Chronic pain    Pain Frequency  Intermittent                       [facility] Adult PT Treatment/Exercise - 01/19/18 0925      Exercises   Exercises  Knee/Hip      Knee/Hip Exercises: Aerobic   Nustep  L4 x 6 min      Knee/Hip Exercises: Standing   Heel Raises  Both;10 reps;3 seconds    Heel Raises Limitations  cue for  quad & glute activation    Hip Flexion  Right;Left;10 reps;Knee straight;Stengthening    Hip Flexion Limitations  yellow TB, cues for abd bracing; UE support on back of chair    Hip ADduction  Right;Left;10 reps;Strengthening    Hip ADduction Limitations  yellow TB, cues for abd bracing & avoiding twisting torso; UE support on back of chair    Hip Abduction  Right;Left;10 reps;Knee straight;Stengthening    Abduction Limitations  yellow TB, cues for abd bracing & avoiding excessive trunk flexion; UE support on back of chair    Hip Extension  Right;Left;10 reps;Knee straight;Stengthening    Extension Limitations  yellow TB, cues for abd bracing; UE support on back of chair    Functional Squat  10 reps;3 seconds    Functional Squat Limitations  counter squat - cues for proper alignment      Modalities   Modalities  Electrical Stimulation;Cryotherapy      Cryotherapy   Number Minutes Cryotherapy  15 Minutes    Cryotherapy Location  Hip   Rt   Type of Cryotherapy  Ice pack      Electrical  Stimulation   Electrical Stimulation Location  R lateral hip - trochanteric bursa    Electrical Stimulation Action  IFC    Electrical Stimulation Parameters  80-150 Hz, intenity to pt tol x15'    Electrical Stimulation Goals  Pain      Manual Therapy   Manual Therapy  Soft tissue mobilization;Myofascial release;Taping    Manual therapy comments  sidelying with R LE elevated on bolster    Soft tissue mobilization  STM to R glutes & piriformis     Myofascial Release  manual TPR to R glute minimus & lateral piriformis    Kinesiotex  Inhibit Muscle;Create Space      [employer] tape 50% star over R greater trochanter (using tail of piriformis tape as base of star)    Inhibit Muscle   Rock tape 30% along belly of R pirformis               PT Short Term Goals - 01/19/18 0950      PT SHORT TERM GOAL #1   Status  Achieved        PT Long Term Goals - 01/03/18 0904      PT LONG TERM GOAL #1   Title  Independent with ongoing/advanced HEP     Status  On-going      PT LONG TERM GOAL #2   Title  Pt will report >/= 50% reduction in L lateral hip pain    Status  On-going      PT LONG TERM GOAL #3   Title  L hip strength grossly 4/5 or greater for improved stability    Status  On-going      PT LONG TERM GOAL #4   Title  Pt will report ability to walk long distances w/o need for rollator due to L lateral hip pain    Status  On-going            Plan - 01/19/18 0932    Clinical Impression Statement  Deanna Rowe reporting increased pain following last therapy session which has now improved, and reports overall improvement in pain since start of PT. Pt reporting good tolerance for and no issues/concerns with initial HEP and reports resistance bands still offering a good challenge - STG met. Progressed hip strengthening to standing with good tolerance other than  some pain associated with fatigue during R SLS for L 4 way SLR. Continued increased muscle tension and  ttp persists in R piriformis and to lesser extent glutes - addressed with manual therapy followed by rock tape to R piriformis and over greater trochanter to promote further muscle relaxation and increase space over greater trochanteric bursa. Given presence of tape, treatment concluded with estim and ice to promote further pain relief and muscle relaxation.    Rehab Potential  Good    Clinical Impairments Affecting Rehab Potential  s/p B THA    PT Treatment/Interventions  Patient/family education;ADLs/Self Care Home Management;Cryotherapy;Electrical Stimulation;Iontophoresis 56m/ml Dexamethasone;Moist Heat;Therapeutic exercise;Therapeutic activities;Functional mobility training;Gait training;Stair training;Neuromuscular re-education;Balance training;Manual techniques;Taping;Dry needling;Passive range of motion    Consulted and Agree with Plan of Care  Patient       Patient will benefit from skilled therapeutic intervention in order to improve the following deficits and impairments:  Pain, Increased muscle spasms, Impaired flexibility, Decreased range of motion, Decreased strength, Difficulty walking, Abnormal gait, Decreased activity tolerance, Decreased balance, Decreased mobility  Visit Diagnosis: Pain in left hip  Pain in right hip  Difficulty in walking, not elsewhere classified  Other abnormalities of gait and mobility  Muscle weakness (generalized)     Problem List Patient Active Problem List   Diagnosis Date Noted  . RLS (restless legs syndrome) 01/15/2018  . Tremor of right hand 03/02/2017  . [place]ism ([place] 03/02/2017  . UTI (urinary tract infection) 12/01/2015  . Diabetes ([place] 06/03/2015  . 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 2 diabetes mellitus ([place] 04/24/2014  . Postoperative anemia due to acute blood loss 10/10/2013  . Avascular necrosis of bone of left hip ([place] 10/09/2013  . OA (osteoarthritis) of hip 10/09/2013  . Cough 07/31/2013  . Shortness of breath 06/18/2013  .  Anxiety   . Esophageal dysphagia 09/12/2012  . Depression 02/21/2012  . Stroke ([facility] 02/11/2012    JPercival Spanish PT, MPT 01/19/2018, 10:21 AM  [facility] [place] [address]Phone: [phone]  Fax:  [phone] Name: Deanna SLOANMRN: [id number] of Birth: 107/13/1957

## 2018-01-23 ENCOUNTER — Ambulatory Visit: Payer: 59

## 2018-01-23 DIAGNOSIS — M25551 Pain in right hip: Secondary | ICD-10-CM | POA: Diagnosis not present

## 2018-01-23 DIAGNOSIS — M25552 Pain in left hip: Secondary | ICD-10-CM | POA: Diagnosis not present

## 2018-01-23 DIAGNOSIS — R262 Difficulty in walking, not elsewhere classified: Secondary | ICD-10-CM | POA: Diagnosis not present

## 2018-01-23 DIAGNOSIS — M6281 Muscle weakness (generalized): Secondary | ICD-10-CM | POA: Diagnosis not present

## 2018-01-23 DIAGNOSIS — M25652 Stiffness of left hip, not elsewhere classified: Secondary | ICD-10-CM | POA: Diagnosis not present

## 2018-01-23 DIAGNOSIS — R2689 Other abnormalities of gait and mobility: Secondary | ICD-10-CM | POA: Diagnosis not present

## 2018-01-23 NOTE — Therapy (Signed)
[facility] [address]  [place], [place], [address] Phone: [phone]   Fax:  [phone]  Physical Therapy Treatment  Patient Details  Name: Deanna Rowe MRN: [id number] Date of Birth: March 24, 1956 Referring Provider (PT): Rod Can, MD   Encounter Date: 01/23/2018  PT End of Session - 01/23/18 0849    Visit Number  6    Number of Visits  12    Date for PT Re-Evaluation  02/08/18    Authorization Type  [facility]    PT Start Time  0839    PT Stop Time  0937    PT Time Calculation (min)  58 min    Activity Tolerance  Patient tolerated treatment well    Behavior During Therapy  [facility] for tasks assessed/performed       Past Medical History:  Diagnosis Date  . Anxiety   . Arthritis    L HIP  . Avascular necrosis of hip (HCC)    LEFT  . Depression   . Diabetes mellitus   . Diabetes mellitus, type II ([place])   . Dry cough   . GERD (gastroesophageal reflux disease)   . Hypertension   . Insomnia    takes Ambien nightly  . Kidney disease   . Obesity   . Parkinsonism ([place]) 03/02/2017  . PONV (postoperative nausea and vomiting)   . RLS (restless legs syndrome) 01/15/2018  . TIA (transient ischemic attack) 2012   no residual problems  . Urgency incontinence     Past Surgical History:  Procedure Laterality Date  . DILATION AND CURETTAGE OF UTERUS    . ESOPHAGEAL MANOMETRY N/A 09/03/2012   Procedure: ESOPHAGEAL MANOMETRY (EM);  Surgeon: Garlan Fair, MD;  [facility];  Service: Endoscopy;  Laterality: N/A;  . ESOPHAGEAL MANOMETRY N/A 06/19/2017   Procedure: ESOPHAGEAL MANOMETRY (EM);  Surgeon: Clarene Essex, MD;  [facility];  Service: Endoscopy;  Laterality: N/A;  . EXPLORATORY LAPAROTOMY    . JOINT REPLACEMENT  2011   rt total hip  . PILONIDAL CYST / SINUS EXCISION    . TOTAL HIP ARTHROPLASTY Left 10/09/2013   Procedure: LEFT TOTAL HIP ARTHROPLASTY ANTERIOR APPROACH;   Surgeon: [name], MD;  [facility];  Service: Orthopedics;  Laterality: Left;    There were no vitals filed for this visit.  Subjective Assessment - 01/23/18 0841    Subjective  Pt. noting some soreness today however attributes this to weather change.  Pt. noting good relief from taping last visit which came off after two days.      Pertinent History  B THA - R 2011 & L 2015    Diagnostic tests  10/11/17 R hip x-ray: No acute osseous abnormality of the pelvis and right hip    Patient Stated Goals  "no more bursitis"    Currently in Pain?  Yes    Pain Score  6     Pain Location  Hip    Pain Orientation  Right;Lateral    Pain Descriptors / Indicators  Sharp    Pain Type  Acute pain;Chronic pain    Pain Onset  More than a month ago    Pain Frequency  Intermittent    Aggravating Factors   Getting in an out of bed, walking     Pain Relieving Factors  tylenol, ice     Multiple Pain Sites  No  [facility] Adult PT Treatment/Exercise - 01/23/18 0852      Knee/Hip Exercises: Stretches   Passive Hamstring Stretch  Right;2 reps;30 seconds    Passive Hamstring Stretch Limitations  supine with strap    ITB Stretch  Right;30 seconds;2 reps    ITB Stretch Limitations  supine with strap    Piriformis Stretch  Right;3 reps;30 seconds    Piriformis Stretch Limitations  supine KTOS      Knee/Hip Exercises: Aerobic   Nustep  L4 x 7 min      Knee/Hip Exercises: Standing   Functional Squat  --   x 12 reps with red looped TB at knees and chair behind      Knee/Hip Exercises: Seated   Ball Squeeze  5" x 10 resp     Clamshell with TheraBand  Red   x 10 reps each way      Cryotherapy   Number Minutes Cryotherapy  10 Minutes    Cryotherapy Location  --   R   Type of Cryotherapy  Ice pack      Electrical Stimulation   Electrical Stimulation Location  R lateral hip - trochanteric bursa    Electrical Stimulation Action  IFC    Electrical Stimulation  Parameters  80-150Hz , intensity to pt. tolerance, 10'    Electrical Stimulation Goals  Pain      Manual Therapy   Manual Therapy  Soft tissue mobilization;Myofascial release;Taping    Manual therapy comments  sidelying with R LE elevated on bolster    Soft tissue mobilization  STM to R glutes & piriformis     Myofascial Release  manual TPR to R glute & lateral piriformis    Kinesiotex  Inhibit Muscle;Create Space      [employer] tape 50% star over R greater trochanter (using tail of piriformis tape as base of star)    Inhibit Muscle   Rock tape 30% along belly of R pirformis               PT Short Term Goals - 01/19/18 0950      PT SHORT TERM GOAL #1   Status  Achieved        PT Long Term Goals - 01/03/18 0904      PT LONG TERM GOAL #1   Title  Independent with ongoing/advanced HEP     Status  On-going      PT LONG TERM GOAL #2   Title  Pt will report >/= 50% reduction in L lateral hip pain    Status  On-going      PT LONG TERM GOAL #3   Title  L hip strength grossly 4/5 or greater for improved stability    Status  On-going      PT LONG TERM GOAL #4   Title  Pt will report ability to walk long distances w/o need for rollator due to L lateral hip pain    Status  On-going            Plan - 01/23/18 0849    Clinical Impression Statement  Pt. noting good benefit from taping applied last visit which lasted two days thus reapplied taping today.  Mikeria remains ttp and with increased palpable tone in R lateral glute/piriformis which was addressed with manual therapy and taping for hopeful reduction in tone and tension.  Pt. tolerated all strengthening therex well today and HEP updated with standing proximal hip strengthening activities.  Will monitor  response to this in coming visit.    Clinical Impairments Affecting Rehab Potential  s/p B THA    PT Treatment/Interventions  Patient/family education;ADLs/Self Care Home  Management;Cryotherapy;Electrical Stimulation;Iontophoresis 4mg /ml Dexamethasone;Moist Heat;Therapeutic exercise;Therapeutic activities;Functional mobility training;Gait training;Stair training;Neuromuscular re-education;Balance training;Manual techniques;Taping;Dry needling;Passive range of motion    Consulted and Agree with Plan of Care  Patient       Patient will benefit from skilled therapeutic intervention in order to improve the following deficits and impairments:  Pain, Increased muscle spasms, Impaired flexibility, Decreased range of motion, Decreased strength, Difficulty walking, Abnormal gait, Decreased activity tolerance, Decreased balance, Decreased mobility  Visit Diagnosis: Pain in left hip  Pain in right hip  Difficulty in walking, not elsewhere classified  Other abnormalities of gait and mobility  Muscle weakness (generalized)     Problem List Patient Active Problem List   Diagnosis Date Noted  . RLS (restless legs syndrome) 01/15/2018  . Tremor of right hand 03/02/2017  . Parkinsonism ([place]) 03/02/2017  . UTI (urinary tract infection) 12/01/2015  . Diabetes ([place]) 06/03/2015  . 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 Postoperative anemia due to acute blood loss 10/10/2013  . Avascular necrosis of bone of left hip ([place]) 10/09/2013  . OA (osteoarthritis) of hip 10/09/2013  . Cough 07/31/2013  . Shortness of breath 06/18/2013  . Anxiety   . Esophageal dysphagia 09/12/2012  . Depression 02/21/2012  . Stroke [facility]) 02/11/2012    Bess Harvest, PTA 01/23/18 12:30 PM    [facility] [address]  [place], [place], [address] Phone: [phone]   Fax:  [phone]  Name: JKAYLA SPIEWAK MRN: [id number] Date of Birth: 1955/10/30

## 2018-01-26 ENCOUNTER — Encounter: Payer: Self-pay | Admitting: Physical Therapy

## 2018-01-26 ENCOUNTER — Ambulatory Visit: Payer: 59 | Admitting: Physical Therapy

## 2018-01-26 DIAGNOSIS — R262 Difficulty in walking, not elsewhere classified: Secondary | ICD-10-CM | POA: Diagnosis not present

## 2018-01-26 DIAGNOSIS — R2689 Other abnormalities of gait and mobility: Secondary | ICD-10-CM

## 2018-01-26 DIAGNOSIS — M25552 Pain in left hip: Secondary | ICD-10-CM

## 2018-01-26 DIAGNOSIS — M6281 Muscle weakness (generalized): Secondary | ICD-10-CM

## 2018-01-26 DIAGNOSIS — M25652 Stiffness of left hip, not elsewhere classified: Secondary | ICD-10-CM | POA: Diagnosis not present

## 2018-01-26 DIAGNOSIS — M25551 Pain in right hip: Secondary | ICD-10-CM | POA: Diagnosis not present

## 2018-01-26 NOTE — Therapy (Signed)
[facility] [address]  [place], [place], [address] Phone: [phone]   Fax:  [phone]  Physical Therapy Treatment  Patient Details  Name: Deanna Rowe MRN: [id number] Date of Birth: [birth year] Referring Provider (PT): Rod Can, MD   Encounter Date: 01/26/2018  PT End of Session - 01/26/18 0849    Visit Number  7    Number of Visits  12    Date for PT Re-Evaluation  02/08/18    Authorization Type  [facility] & Medicare    PT Start Time  3335    PT Stop Time  0938    PT Time Calculation (min)  49 min    Activity Tolerance  Patient tolerated treatment well    Behavior During Therapy  [facility] for tasks assessed/performed       Past Medical History:  Diagnosis Date  . Anxiety   . Arthritis    L HIP  . Avascular necrosis of hip (HCC)    LEFT  . Depression   . Diabetes mellitus   . Diabetes mellitus, type II ([place])   . Dry cough   . GERD (gastroesophageal reflux disease)   . Hypertension   . Insomnia    takes Ambien nightly  . Kidney disease   . Obesity   . Parkinsonism ([place]) 03/02/2017  . PONV (postoperative nausea and vomiting)   . RLS (restless legs syndrome) 01/15/2018  . TIA (transient ischemic attack) 2012   no residual problems  . Urgency incontinence     Past Surgical History:  Procedure Laterality Date  . DILATION AND CURETTAGE OF UTERUS    . ESOPHAGEAL MANOMETRY N/A 09/03/2012   Procedure: ESOPHAGEAL MANOMETRY (EM);  Surgeon: Garlan Fair, MD;  [facility];  Service: Endoscopy;  Laterality: N/A;  . ESOPHAGEAL MANOMETRY N/A 06/19/2017   Procedure: ESOPHAGEAL MANOMETRY (EM);  Surgeon: Clarene Essex, MD;  [facility];  Service: Endoscopy;  Laterality: N/A;  . EXPLORATORY LAPAROTOMY    . JOINT REPLACEMENT  2011   rt total hip  . PILONIDAL CYST / SINUS EXCISION    . TOTAL HIP ARTHROPLASTY Left 10/09/2013   Procedure: LEFT TOTAL HIP ARTHROPLASTY ANTERIOR APPROACH;   Surgeon: [name], MD;  [facility];  Service: Orthopedics;  Laterality: Left;    There were no vitals filed for this visit.  Subjective Assessment - 01/26/18 0851    Subjective  Pt reporting inreased pain last night that prevented her from sleeping well - mostly had been working household chores yesterday but denies pain during those activities. Pt notes temporary relief following individual PT vists, esp from ionto patch and taping (although reports she had to remove latest taping as it was pulling too tight), but not noticing much change in overall hip pain since start of PT.    Pertinent History  B THA - R 2011 & L 2015    Diagnostic tests  10/11/17 R hip x-ray: No acute osseous abnormality of the pelvis and right hip    Patient Stated Goals  "no more bursitis"    Currently in Pain?  Yes    Pain Score  5     Pain Location  Hip    Pain Orientation  Right;Lateral    Pain Descriptors / Indicators  Throbbing    Pain Type  Acute pain;Chronic pain    Pain Frequency  Intermittent  [facility] Adult PT Treatment/Exercise - 01/26/18 0849      Self-Care   Self-Care  Posture    Posture  Pt inquiring about how to best tie her shoes as she finds bending over to tie them the way she normally would greatly increases her pain - demonstrated ideal positions in sitting where she brings her leg up toward her chest or out to side on edge of bed/chair - pt attempting both and reports latter position much better tolerated.      Exercises   Exercises  Knee/Hip      Knee/Hip Exercises: Stretches   Piriformis Stretch  Right;30 seconds;2 reps    Piriformis Stretch Limitations  supine KTOS      Knee/Hip Exercises: Aerobic   Nustep  L4 x 6 min      Knee/Hip Exercises: Supine   Bridges  Both;Strengthening;15 reps    Bridges Limitations  + red TB hip ABD isometric    Other Supine Knee/Hip Exercises  Alternating hip abd/ER with red TB x 10      Modalities    Modalities  Iontophoresis      Iontophoresis   Type of Iontophoresis  Dexamethasone    Location  R greater trochanteric bursa    Dose  80 mA-min; 1.0 mL    Time  4-6 hr patch (#3 of 6)      Manual Therapy   Manual Therapy  Soft tissue mobilization;Myofascial release    Manual therapy comments  prone    Soft tissue mobilization  STM to R glutes & piriformis     Myofascial Release  manual TPR to R glute minimus/mediius & lateral piriformis       Trigger Point Dry Needling - 01/26/18 0849    Consent Given?  Yes    Education Handout Provided  Yes    Muscles Treated Lower Body  Gluteus minimus;Gluteus maximus;Piriformis           PT Education - 01/26/18 0957    Education Details  Role of DN, expected response to treatment and recommended post-treatment activity/exercises    Person(s) Educated  Patient    Methods  Explanation;Demonstration;Handout    Comprehension  Verbalized understanding       PT Short Term Goals - 01/19/18 0950      PT SHORT TERM GOAL #1   Status  Achieved        PT Long Term Goals - 01/03/18 0904      PT LONG TERM GOAL #1   Title  Independent with ongoing/advanced HEP     Status  On-going      PT LONG TERM GOAL #2   Title  Pt will report >/= 50% reduction in L lateral hip pain    Status  On-going      PT LONG TERM GOAL #3   Title  L hip strength grossly 4/5 or greater for improved stability    Status  On-going      PT LONG TERM GOAL #4   Title  Pt will report ability to walk long distances w/o need for rollator due to L lateral hip pain    Status  On-going            Plan - 01/26/18 0855    Clinical Impression Statement  Tyla reporting temporary relief following from some PT interventions, esp from ionto patch and initial taping, but not noticing much change in overall hip pain since start of PT. Given ongoing increased muscle tension and ttp due  to taut band and TPs in glutes and piriformis, discussed option for DN in conjunction  with manual therapy - pt interested in trying this and after informed consent, performed DN to R glutes and piriformis with postive twitch response and palpable reduction in muscle tension as well as pt noting significant relief of pain. Treatment concluded with review of stretches and exercises to promote normalization of muscle tension followed by 3rd ionto patch application.    Rehab Potential  Good    Clinical Impairments Affecting Rehab Potential  s/p B THA    PT Treatment/Interventions  Patient/family education;ADLs/Self Care Home Management;Cryotherapy;Electrical Stimulation;Iontophoresis 4mg /ml Dexamethasone;Moist Heat;Therapeutic exercise;Therapeutic activities;Functional mobility training;Gait training;Stair training;Neuromuscular re-education;Balance training;Manual techniques;Taping;Dry needling;Passive range of motion    Consulted and Agree with Plan of Care  Patient       Patient will benefit from skilled therapeutic intervention in order to improve the following deficits and impairments:  Pain, Increased muscle spasms, Impaired flexibility, Decreased range of motion, Decreased strength, Difficulty walking, Abnormal gait, Decreased activity tolerance, Decreased balance, Decreased mobility  Visit Diagnosis: Pain in left hip  Pain in right hip  Difficulty in walking, not elsewhere classified  Other abnormalities of gait and mobility  Muscle weakness (generalized)     Problem List Patient Active Problem List   Diagnosis Date Noted  . RLS (restless legs syndrome) 01/15/2018  . Tremor of right hand 03/02/2017  . Parkinsonism ([place]) 03/02/2017  . UTI (urinary tract infection) 12/01/2015  . Diabetes ([place]) 06/03/2015  . 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 Postoperative anemia due to acute blood loss 10/10/2013  . Avascular necrosis of bone of left hip ([place]) 10/09/2013  . OA (osteoarthritis) of hip 10/09/2013  . Cough 07/31/2013  . Shortness of  breath 06/18/2013  . Anxiety   . Esophageal dysphagia 09/12/2012  . Depression 02/21/2012  . Stroke [facility]) 02/11/2012    Percival Spanish, PT, MPT 01/26/2018, 9:58 AM  [facility] [address]  [place], [place], [address] Phone: [phone]   Fax:  [phone]  Name: JAYLON GRODE MRN: [id number] Date of Birth: 1956-03-04

## 2018-01-26 NOTE — Patient Instructions (Signed)

## 2018-01-30 ENCOUNTER — Encounter: Payer: Self-pay | Admitting: Physical Therapy

## 2018-01-30 ENCOUNTER — Ambulatory Visit: Payer: 59 | Admitting: Physical Therapy

## 2018-01-30 DIAGNOSIS — M6281 Muscle weakness (generalized): Secondary | ICD-10-CM

## 2018-01-30 DIAGNOSIS — M25551 Pain in right hip: Secondary | ICD-10-CM

## 2018-01-30 DIAGNOSIS — M25552 Pain in left hip: Secondary | ICD-10-CM

## 2018-01-30 DIAGNOSIS — R2689 Other abnormalities of gait and mobility: Secondary | ICD-10-CM | POA: Diagnosis not present

## 2018-01-30 DIAGNOSIS — R262 Difficulty in walking, not elsewhere classified: Secondary | ICD-10-CM | POA: Diagnosis not present

## 2018-01-30 DIAGNOSIS — M25652 Stiffness of left hip, not elsewhere classified: Secondary | ICD-10-CM | POA: Diagnosis not present

## 2018-01-30 NOTE — Therapy (Signed)
[facility] [address]  [place], [place], [address] Phone: [phone]   Fax:  [phone]  Physical Therapy Treatment  Patient Details  Name: Deanna Rowe MRN: [id number] Date of Birth: [birth year] Referring Provider (PT): Deanna Can, MD   Encounter Date: 01/30/2018  PT End of Session - 01/30/18 0930    Visit Number  8    Number of Visits  12    Date for PT Re-Evaluation  02/08/18    Authorization Type  [facility] & [facility]    PT Start Time  0930    PT Stop Time  1015    PT Time Calculation (min)  45 min    Activity Tolerance  Patient tolerated treatment well    Behavior During Therapy  [facility] for tasks assessed/performed       Past Medical History:  Diagnosis Date  . Anxiety   . Arthritis    L HIP  . Avascular necrosis of hip (HCC)    LEFT  . Depression   . Diabetes mellitus   . Diabetes mellitus, type II ([place])   . Dry cough   . GERD (gastroesophageal reflux disease)   . Hypertension   . Insomnia    takes Ambien nightly  . Kidney disease   . Obesity   . Parkinsonism ([place]) 03/02/2017  . PONV (postoperative nausea and vomiting)   . RLS (restless legs syndrome) 01/15/2018  . TIA (transient ischemic attack) 2012   no residual problems  . Urgency incontinence     Past Surgical History:  Procedure Laterality Date  . DILATION AND CURETTAGE OF UTERUS    . ESOPHAGEAL MANOMETRY N/A 09/03/2012   Procedure: ESOPHAGEAL MANOMETRY (EM);  Surgeon: Deanna Fair, MD;  [facility];  Service: Endoscopy;  Laterality: N/A;  . ESOPHAGEAL MANOMETRY N/A 06/19/2017   Procedure: ESOPHAGEAL MANOMETRY (EM);  Surgeon: Deanna Essex, MD;  [facility];  Service: Endoscopy;  Laterality: N/A;  . EXPLORATORY LAPAROTOMY    . JOINT REPLACEMENT  2011   rt total hip  . PILONIDAL CYST / SINUS EXCISION    . TOTAL HIP ARTHROPLASTY Left 10/09/2013   Procedure: LEFT TOTAL HIP ARTHROPLASTY ANTERIOR APPROACH;   Surgeon: Deanna Alf, MD;  [facility];  Service: Orthopedics;  Laterality: Left;    There were no vitals filed for this visit.  Subjective Assessment - 01/30/18 0937    Subjective  "Those needles helped - I have no pain!"    Pertinent History  B THA - R 2011 & L 2015    Diagnostic tests  10/11/17 R hip x-ray: No acute osseous abnormality of the pelvis and right hip    Patient Stated Goals  "no more bursitis"    Currently in Pain?  No/denies                       [facility] Adult PT Treatment/Exercise - 01/30/18 0930      Exercises   Exercises  Knee/Hip      Knee/Hip Exercises: Aerobic   Nustep  L4 x 6 min      Knee/Hip Exercises: Standing   Hip Flexion  Right;Left;10 reps;Knee straight;Stengthening    Hip Flexion Limitations  red TB, cues for abd bracing; 2 pole A    Hip ADduction  Right;Left;10 reps;Strengthening    Hip ADduction Limitations  red TB, cues for abd bracing; 2 pole A    Hip Abduction  Right;Left;10 reps;Knee  straight;Stengthening    Abduction Limitations  red TB, cues for abd bracing; 2 pole A    Hip Extension  Right;Left;10 reps;Knee straight;Stengthening    Extension Limitations  red TB, cues for abd bracing; 2 pole A    Other Standing Knee Exercises  B side-stepping & fwd/back monster walk with red TB 4 x 73ft at counter for support      Knee/Hip Exercises: Seated   Hamstring Curl  Right;Left;15 reps;Strengthening    Hamstring Limitations  red TB      Knee/Hip Exercises: Supine   Bridges with Ball Squeeze  Both;Strengthening;5 reps;2 sets    Other Supine Knee/Hip Exercises  Brace marching with red TB x15      Knee/Hip Exercises: Sidelying   Clams  R/L clam with red TB x 10 each               PT Short Term Goals - 01/19/18 0950      PT SHORT TERM GOAL #1   Status  Achieved        PT Long Term Goals - 01/03/18 0904      PT LONG TERM GOAL #1   Title  Independent with ongoing/advanced HEP     Status  On-going      PT  LONG TERM GOAL #2   Title  Pt will report >/= 50% reduction in L lateral hip pain    Status  On-going      PT LONG TERM GOAL #3   Title  L hip strength grossly 4/5 or greater for improved stability    Status  On-going      PT LONG TERM GOAL #4   Title  Pt will report ability to walk long distances w/o need for rollator due to L lateral hip pain    Status  On-going            Plan - 01/30/18 0938    Clinical Impression Statement  [name] reporting complete relief of pain following manual therapy with DN last session and remains pain free today. Treatment focusing on proximal strengthening to promote improved stability and prevent return of pain, with good tolerance other than expected fatigue and no pain reported.    Rehab Potential  Good    Clinical Impairments Affecting Rehab Potential  s/p B THA    PT Treatment/Interventions  Patient/family education;ADLs/Self Care Home Management;Cryotherapy;Electrical Stimulation;Iontophoresis 4mg /ml Dexamethasone;Moist Heat;Therapeutic exercise;Therapeutic activities;Functional mobility training;Gait training;Stair training;Neuromuscular re-education;Balance training;Manual techniques;Taping;Dry needling;Passive range of motion    Consulted and Agree with Plan of Care  Patient       Patient will benefit from skilled therapeutic intervention in order to improve the following deficits and impairments:  Pain, Increased muscle spasms, Impaired flexibility, Decreased range of motion, Decreased strength, Difficulty walking, Abnormal gait, Decreased activity tolerance, Decreased balance, Decreased mobility  Visit Diagnosis: Pain in left hip  Pain in right hip  Difficulty in walking, not elsewhere classified  Other abnormalities of gait and mobility  Muscle weakness (generalized)     Problem List Patient Active Problem List   Diagnosis Date Noted  . RLS (restless legs syndrome) 01/15/2018  . Tremor of right hand 03/02/2017  .  Parkinsonism ([place]) 03/02/2017  . UTI (urinary tract infection) 12/01/2015  . Diabetes ([place]) 06/03/2015  . 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 Postoperative anemia due to acute blood loss 10/10/2013  . Avascular necrosis of bone of left hip ([place]) 10/09/2013  . OA (osteoarthritis) of hip 10/09/2013  .  Cough 07/31/2013  . Shortness of breath 06/18/2013  . Anxiety   . Esophageal dysphagia 09/12/2012  . Depression 02/21/2012  . Stroke [facility]) 02/11/2012    Percival Spanish, PT, MPT 01/30/2018, 10:53 AM  [facility] [address]  [place], [place], [address] Phone: [phone]   Fax:  [phone]  Name: Deanna Rowe MRN: [id number] Date of Birth: 1956-01-19

## 2018-01-31 MED FILL — metFORMIN HCL 500 MG TABS: 500 | 30 days supply | Qty: 60 | Fill #2

## 2018-01-31 MED FILL — AMLODIPINE BESYLATE 5 MG TA: 5 | 90 days supply | Qty: 90 | Fill #0

## 2018-02-02 ENCOUNTER — Ambulatory Visit: Payer: 59 | Attending: Orthopedic Surgery | Admitting: Physical Therapy

## 2018-02-02 ENCOUNTER — Encounter: Payer: Self-pay | Admitting: Physical Therapy

## 2018-02-02 DIAGNOSIS — M25552 Pain in left hip: Secondary | ICD-10-CM | POA: Insufficient documentation

## 2018-02-02 DIAGNOSIS — M6281 Muscle weakness (generalized): Secondary | ICD-10-CM | POA: Insufficient documentation

## 2018-02-02 DIAGNOSIS — R2689 Other abnormalities of gait and mobility: Secondary | ICD-10-CM | POA: Diagnosis not present

## 2018-02-02 DIAGNOSIS — R262 Difficulty in walking, not elsewhere classified: Secondary | ICD-10-CM | POA: Insufficient documentation

## 2018-02-02 DIAGNOSIS — M25551 Pain in right hip: Secondary | ICD-10-CM | POA: Diagnosis not present

## 2018-02-02 NOTE — Therapy (Addendum)
[facility] [address]  [place], [place], [address] Phone: [phone]   Fax:  [phone]  Physical Therapy Treatment / Discharge Summary  Patient Details  Name: Deanna Rowe MRN: [id number] Date of Birth: Apr 21, 1955 Referring Provider (PT): Rod Can, MD   Encounter Date: 02/02/2018  PT End of Session - 02/02/18 0837    Visit Number  9    Number of Visits  12    Date for PT Re-Evaluation  02/08/18    Authorization Type  [facility] & [facility]    PT Start Time  0837    PT Stop Time  0927    PT Time Calculation (min)  50 min    Activity Tolerance  Patient tolerated treatment well    Behavior During Therapy  [facility] for tasks assessed/performed       Past Medical History:  Diagnosis Date  . Anxiety   . Arthritis    L HIP  . Avascular necrosis of hip (HCC)    LEFT  . Depression   . Diabetes mellitus   . Diabetes mellitus, type II ([place])   . Dry cough   . GERD (gastroesophageal reflux disease)   . Hypertension   . Insomnia    takes Ambien nightly  . Kidney disease   . Obesity   . Parkinsonism ([place]) 03/02/2017  . PONV (postoperative nausea and vomiting)   . RLS (restless legs syndrome) 01/15/2018  . TIA (transient ischemic attack) 2012   no residual problems  . Urgency incontinence     Past Surgical History:  Procedure Laterality Date  . DILATION AND CURETTAGE OF UTERUS    . ESOPHAGEAL MANOMETRY N/A 09/03/2012   Procedure: ESOPHAGEAL MANOMETRY (EM);  Surgeon: Garlan Fair, MD;  [facility];  Service: Endoscopy;  Laterality: N/A;  . ESOPHAGEAL MANOMETRY N/A 06/19/2017   Procedure: ESOPHAGEAL MANOMETRY (EM);  Surgeon: Clarene Essex, MD;  [facility];  Service: Endoscopy;  Laterality: N/A;  . EXPLORATORY LAPAROTOMY    . JOINT REPLACEMENT  2011   rt total hip  . PILONIDAL CYST / SINUS EXCISION    . TOTAL HIP ARTHROPLASTY Left 10/09/2013   Procedure: LEFT TOTAL HIP ARTHROPLASTY  ANTERIOR APPROACH;  Surgeon: [name], MD;  [facility];  Service: Orthopedics;  Laterality: Left;    There were no vitals filed for this visit.  Subjective Assessment - 02/02/18 0838    Subjective  Pt remaining pain free with no new concerns.    Pertinent History  B THA - R 2011 & L 2015    Diagnostic tests  10/11/17 R hip x-ray: No acute osseous abnormality of the pelvis and right hip    Patient Stated Goals  "no more bursitis"    Currently in Pain?  No/denies         [facility] PT Assessment - 02/02/18 0837      Assessment   Medical Diagnosis  R hip trochanteric bursitis     Referring Provider (PT)  Rod Can, MD    Onset Date/Surgical Date  --   acute exacerbation in early Sept; chronic pain since ~2016   Next MD Visit  PRN      Observation/Other Assessments   Focus on Therapeutic Outcomes (FOTO)   Hip - 87% (13% limitation)      Strength   Right Hip Flexion  4+/5    Right Hip Extension  4/5    Right Hip External Rotation  4/5    Right Hip Internal Rotation  4/5    Right Hip ABduction  4+/5    Right Hip ADduction  4+/5    Left Hip Flexion  4+/5    Left Hip Extension  4+/5    Left Hip External Rotation  4+/5    Left Hip Internal Rotation  4+/5    Left Hip ABduction  5/5    Left Hip ADduction  4+/5    Right Knee Flexion  5/5    Right Knee Extension  5/5    Left Knee Flexion  5/5    Left Knee Extension  5/5                   [facility] Adult PT Treatment/Exercise - 02/02/18 0837      Exercises   Exercises  Knee/Hip      Knee/Hip Exercises: Aerobic   Nustep  L5 x 6 min      Knee/Hip Exercises: Standing   Hip Flexion  Right;Left;10 reps;Knee straight;Stengthening    Hip Flexion Limitations  looped red TB at ankles, cues for abd bracing; UE support on back of chair    Hip Abduction  Right;Left;10 reps;Knee straight;Stengthening    Abduction Limitations  looped red TB at ankles, cues for abd bracing; UE support on back of chair    Hip  Extension  Right;Left;10 reps;Knee straight;Stengthening    Extension Limitations  looped red TB at ankles, cues for abd bracing; UE support on back of chair    Other Standing Knee Exercises  B side-stepping & fwd monster walk with red TB  2 x 25 ft      Knee/Hip Exercises: Supine   Bridges  Both;10 reps;Strengthening    Bridges Limitations  + green TB hip ABD isometric    Bridges with Clamshell  10 reps;Right;Left;Strengthening   alt hip ABD/ER with green TB   Other Supine Knee/Hip Exercises  Alternating hip abd/ER with green TB x 10      Knee/Hip Exercises: Sidelying   Clams  R/L clam with green TB x 15 each             PT Education - 02/02/18 0925    Education Details  final HEP review/update    Person(s) Educated  Patient    Methods  Explanation;Demonstration;Handout    Comprehension  Verbalized understanding;Returned demonstration       PT Short Term Goals - 01/19/18 0950      PT SHORT TERM GOAL #1   Status  Achieved        PT Long Term Goals - 02/02/18 0843      PT LONG TERM GOAL #1   Title  Independent with ongoing/advanced HEP     Status  Achieved      PT LONG TERM GOAL #2   Title  Pt will report >/= 50% reduction in L lateral hip pain    Status  Achieved      PT LONG TERM GOAL #3   Title  L hip strength grossly 4/5 or greater for improved stability    Status  Achieved      PT LONG TERM GOAL #4   Title  Pt will report ability to walk long distances w/o need for rollator due to L lateral hip pain    Status  Achieved            Plan - 02/02/18 0840    Clinical Impression Statement  Deanna Rowe reporting complete resolution of her  R hip pain since last week and feels that she has been able to resume all of her normal activities, including walking long distances w/o need for rollator, w/o limitation due to pain or weakness. B LE strength now 4/5 or greater and pt comfortable with current HEP.for continued stretching and strengthening at home. All goals  met at this time and pt would like to try transitioning to HEP - will place pt on hold for 30 days in the event that pain would return or issue arise with HEP.    Rehab Potential  Good    Clinical Impairments Affecting Rehab Potential  s/p B THA    PT Treatment/Interventions  Patient/family education;ADLs/Self Care Home Management;Cryotherapy;Electrical Stimulation;Iontophoresis 28m/ml Dexamethasone;Moist Heat;Therapeutic exercise;Therapeutic activities;Functional mobility training;Gait training;Stair training;Neuromuscular re-education;Balance training;Manual techniques;Taping;Dry needling;Passive range of motion    PT Next Visit Plan  30 day hold    Consulted and Agree with Plan of Care  Patient       Patient will benefit from skilled therapeutic intervention in order to improve the following deficits and impairments:  Pain, Increased muscle spasms, Impaired flexibility, Decreased range of motion, Decreased strength, Difficulty walking, Abnormal gait, Decreased activity tolerance, Decreased balance, Decreased mobility  Visit Diagnosis: Pain in left hip  Pain in right hip  Difficulty in walking, not elsewhere classified  Other abnormalities of gait and mobility  Muscle weakness (generalized)     Problem List Patient Active Problem List   Diagnosis Date Noted  . RLS (restless legs syndrome) 01/15/2018  . Tremor of right hand 03/02/2017  . [place]ism ([place] 03/02/2017  . UTI (urinary tract infection) 12/01/2015  . Diabetes ([place] 06/03/2015  . 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 2 diabetes mellitus ([place] 04/24/2014  . Postoperative anemia due to acute blood loss 10/10/2013  . Avascular necrosis of bone of left hip ([place] 10/09/2013  . OA (osteoarthritis) of hip 10/09/2013  . Cough 07/31/2013  . Shortness of breath 06/18/2013  . Anxiety   . Esophageal dysphagia 09/12/2012  . Depression 02/21/2012  . Stroke ([facility] 02/11/2012    JPercival Spanish PT, MPT 02/02/2018, 12:07 PM  [facility] [place] [address]Phone: [phone]  Fax:  [phone] Name: AJORDIS REPETTOMRN: [id number] of Birth: [birth year] PHYSICAL THERAPY DISCHARGE SUMMARY  Visits from Start of Care: 9  Current functional level related to goals / functional outcomes:   Refer to above clinical impression for status as of last visit on 02/02/18. Pt was placed on hold for 30 days and has not needed to return to PT, therefore will proceed with discharge from PT for this episode.   Remaining deficits:   As above.   Education / Equipment:   HEP  Plan: Patient agrees to discharge.  Patient goals were met. Patient is being discharged due to meeting the stated rehab goals.  ?????     JPercival Spanish PT, MPT 03/07/18, 9:22 AM  [facility] [place] [address]Phone: [phone]  Fax:  [phone]

## 2018-02-07 MED FILL — LANTUS SOLOSTAR 100 UNITS/M: 100 | 86 days supply | Qty: 12 | Fill #0

## 2018-02-08 ENCOUNTER — Encounter: Payer: Self-pay | Admitting: Allergy and Immunology

## 2018-02-08 ENCOUNTER — Ambulatory Visit (INDEPENDENT_AMBULATORY_CARE_PROVIDER_SITE_OTHER): Payer: 59 | Admitting: Allergy and Immunology

## 2018-02-08 ENCOUNTER — Other Ambulatory Visit: Payer: Self-pay

## 2018-02-08 VITALS — BP 126/72 | HR 70 | Temp 98.1°F | Resp 16 | Ht 65.8 in | Wt 227.2 lb

## 2018-02-08 DIAGNOSIS — M87859 Other osteonecrosis, unspecified femur: Secondary | ICD-10-CM | POA: Insufficient documentation

## 2018-02-08 DIAGNOSIS — H9190 Unspecified hearing loss, unspecified ear: Secondary | ICD-10-CM | POA: Insufficient documentation

## 2018-02-08 DIAGNOSIS — R05 Cough: Secondary | ICD-10-CM

## 2018-02-08 DIAGNOSIS — J3089 Other allergic rhinitis: Secondary | ICD-10-CM

## 2018-02-08 DIAGNOSIS — Z780 Asymptomatic menopausal state: Secondary | ICD-10-CM | POA: Insufficient documentation

## 2018-02-08 DIAGNOSIS — D6861 Antiphospholipid syndrome: Secondary | ICD-10-CM | POA: Insufficient documentation

## 2018-02-08 DIAGNOSIS — R55 Syncope and collapse: Secondary | ICD-10-CM | POA: Insufficient documentation

## 2018-02-08 DIAGNOSIS — E7800 Pure hypercholesterolemia, unspecified: Secondary | ICD-10-CM | POA: Insufficient documentation

## 2018-02-08 DIAGNOSIS — J4540 Moderate persistent asthma, uncomplicated: Secondary | ICD-10-CM | POA: Diagnosis not present

## 2018-02-08 DIAGNOSIS — M25559 Pain in unspecified hip: Secondary | ICD-10-CM | POA: Insufficient documentation

## 2018-02-08 DIAGNOSIS — J45991 Cough variant asthma: Secondary | ICD-10-CM | POA: Insufficient documentation

## 2018-02-08 DIAGNOSIS — A084 Viral intestinal infection, unspecified: Secondary | ICD-10-CM | POA: Insufficient documentation

## 2018-02-08 DIAGNOSIS — M7710 Lateral epicondylitis, unspecified elbow: Secondary | ICD-10-CM | POA: Insufficient documentation

## 2018-02-08 DIAGNOSIS — E669 Obesity, unspecified: Secondary | ICD-10-CM | POA: Insufficient documentation

## 2018-02-08 DIAGNOSIS — E1159 Type 2 diabetes mellitus with other circulatory complications: Secondary | ICD-10-CM | POA: Insufficient documentation

## 2018-02-08 DIAGNOSIS — R635 Abnormal weight gain: Secondary | ICD-10-CM | POA: Insufficient documentation

## 2018-02-08 DIAGNOSIS — F324 Major depressive disorder, single episode, in partial remission: Secondary | ICD-10-CM | POA: Insufficient documentation

## 2018-02-08 DIAGNOSIS — Z6839 Body mass index (BMI) 39.0-39.9, adult: Secondary | ICD-10-CM | POA: Insufficient documentation

## 2018-02-08 DIAGNOSIS — K219 Gastro-esophageal reflux disease without esophagitis: Secondary | ICD-10-CM

## 2018-02-08 DIAGNOSIS — B351 Tinea unguium: Secondary | ICD-10-CM | POA: Insufficient documentation

## 2018-02-08 DIAGNOSIS — D6851 Activated protein C resistance: Secondary | ICD-10-CM | POA: Insufficient documentation

## 2018-02-08 DIAGNOSIS — Z1389 Encounter for screening for other disorder: Secondary | ICD-10-CM | POA: Insufficient documentation

## 2018-02-08 DIAGNOSIS — E559 Vitamin D deficiency, unspecified: Secondary | ICD-10-CM | POA: Insufficient documentation

## 2018-02-08 DIAGNOSIS — I152 Hypertension secondary to endocrine disorders: Secondary | ICD-10-CM | POA: Insufficient documentation

## 2018-02-08 DIAGNOSIS — E1142 Type 2 diabetes mellitus with diabetic polyneuropathy: Secondary | ICD-10-CM | POA: Insufficient documentation

## 2018-02-08 DIAGNOSIS — F339 Major depressive disorder, recurrent, unspecified: Secondary | ICD-10-CM | POA: Insufficient documentation

## 2018-02-08 DIAGNOSIS — N183 Chronic kidney disease, stage 3 unspecified: Secondary | ICD-10-CM | POA: Insufficient documentation

## 2018-02-08 DIAGNOSIS — R209 Unspecified disturbances of skin sensation: Secondary | ICD-10-CM | POA: Insufficient documentation

## 2018-02-08 DIAGNOSIS — M79609 Pain in unspecified limb: Secondary | ICD-10-CM | POA: Insufficient documentation

## 2018-02-08 DIAGNOSIS — Z79899 Other long term (current) drug therapy: Secondary | ICD-10-CM | POA: Insufficient documentation

## 2018-02-08 DIAGNOSIS — E162 Hypoglycemia, unspecified: Secondary | ICD-10-CM | POA: Insufficient documentation

## 2018-02-08 DIAGNOSIS — G4700 Insomnia, unspecified: Secondary | ICD-10-CM | POA: Insufficient documentation

## 2018-02-08 DIAGNOSIS — K30 Functional dyspepsia: Secondary | ICD-10-CM | POA: Insufficient documentation

## 2018-02-08 DIAGNOSIS — M47816 Spondylosis without myelopathy or radiculopathy, lumbar region: Secondary | ICD-10-CM | POA: Insufficient documentation

## 2018-02-08 DIAGNOSIS — F1990 Other psychoactive substance use, unspecified, uncomplicated: Secondary | ICD-10-CM | POA: Insufficient documentation

## 2018-02-08 DIAGNOSIS — J0100 Acute maxillary sinusitis, unspecified: Secondary | ICD-10-CM | POA: Insufficient documentation

## 2018-02-08 DIAGNOSIS — I10 Essential (primary) hypertension: Secondary | ICD-10-CM | POA: Insufficient documentation

## 2018-02-08 DIAGNOSIS — R053 Chronic cough: Secondary | ICD-10-CM

## 2018-02-08 MED ORDER — FLUTICASONE PROPIONATE HFA 110 MCG/ACT IN AERO: 2.0000 | INHALATION_SPRAY | Freq: Two times a day (BID) | RESPIRATORY_TRACT | 5 refills | Status: DC

## 2018-02-08 MED ORDER — ALBUTEROL SULFATE HFA 108 (90 BASE) MCG/ACT IN AERS: 2.0000 | INHALATION_SPRAY | RESPIRATORY_TRACT | 2 refills | Status: DC | PRN

## 2018-02-08 MED ORDER — AZELASTINE HCL 0.1 % NA SOLN: 2.0000 | Freq: Two times a day (BID) | NASAL | 5 refills | Status: DC

## 2018-02-08 MED ORDER — AZELASTINE HCL 0.15 % NA SOLN: 2.0000 | Freq: Two times a day (BID) | NASAL | 5 refills | Status: DC

## 2018-02-08 MED ORDER — MONTELUKAST SODIUM 10 MG PO TABS: 10.0000 mg | ORAL_TABLET | Freq: Every day | ORAL | 5 refills | Status: DC

## 2018-02-08 MED ORDER — FAMOTIDINE 20 MG PO TABS: 20.0000 mg | ORAL_TABLET | Freq: Two times a day (BID) | ORAL | 3 refills | Status: DC

## 2018-02-08 MED ORDER — CARBINOXAMINE MALEATE 4 MG PO TABS: 4.0000 mg | ORAL_TABLET | Freq: Four times a day (QID) | ORAL | 3 refills | Status: DC | PRN

## 2018-02-08 MED FILL — AZELASTINE HCL 137 MCG/SPRA: 137 | 25 days supply | Qty: 30 | Fill #0

## 2018-02-08 MED FILL — FAMOTIDINE 20 MG TABLET: 20 | 15 days supply | Qty: 30 | Fill #0

## 2018-02-08 MED FILL — CARBINOXAMINE MALEATE 4 MG: 4 | 7 days supply | Qty: 30 | Fill #0

## 2018-02-08 MED FILL — FLOVENT HFA 110 MCG INHALER: 110 | 30 days supply | Qty: 12 | Fill #0

## 2018-02-08 MED FILL — MONTELUKAST SOD 10 MG TAB: 10 | 30 days supply | Qty: 30 | Fill #0

## 2018-02-08 MED FILL — VENTOLIN HFA 90 MCG INHALER: 108 (90 BAS | 25 days supply | Qty: 18 | Fill #0

## 2018-02-08 NOTE — Assessment & Plan Note (Signed)
The most common causes of chronic cough include the following: upper airway cough syndrome (UACS) which is caused by variety of rhinosinus conditions; asthma; gastroesophageal reflux disease (GERD); chronic bronchitis from cigarette smoking or other inhaled environmental irritants; non-asthmatic eosinophilic bronchitis; and bronchiectasis. In prospective studies, these conditions have accounted for up to 94% of the causes of chronic cough in immunocompetent adults. The history and physical examination suggest that her cough is multifactorial with contribution from thick postnasal drainage, bronchial hyperresponsiveness, and acid reflux. We will address these issues at this time.   A prescription has been provided for a flutter valve to be used as needed to break the coughing cycle.  Treatment plan as outlined above.    We will regroup in 6 weeks to assess treatment response and adjust therapy accordingly.

## 2018-02-08 NOTE — Progress Notes (Signed)
New Patient Note  RE: Deanna Rowe MRN: [id number] DOB: May 03, 1955 Date of Office Visit: 02/08/2018  Referring provider: Leighton Ruff, MD Primary care provider: Leighton Ruff, MD  Chief Complaint: Sinus Problem; Cough; and Wheezing   History of present illness: Deanna Rowe is a 62 y.o. female seen today in consultation requested by Leighton Ruff, MD.  She complains of nasal congestion, rhinorrhea, sneezing, thick postnasal drainage, nasal pruritus, ocular pruritus, and sinus pressure over the cheekbones.  In addition, she complains of a persistent, occasionally productive cough.  The cough is worse at nighttime and is occasionally accompanied by wheezing and chest tightness.  She also wheezes with exercise.  All in all, on average she wheezes 3 or 4 times per week.  She currently takes Qvar HFA 80 g, 1 inhalation daily as needed and albuterol HFA as needed.  She has experienced racing heart rate with Advair.  No significant seasonal symptom variation has been noted nor have specific environmental triggers been identified.  She attempts to control the symptoms with cetirizine, loratadine, Delsym, or fluticasone nasal spray, however "nothing helps."  She has achalasia and experiences frequent heartburn despite compliance with Protonix daily.  Assessment and plan: Moderate persistent asthma Currently with suboptimal control, we will step up therapy at this time.  The Qvar HFA that she has been using must be expired as this medication via the [facility] device was discontinued in early 2018.  A prescription has been provided for Flovent (fluticasone) 110 g,  2 inhalations twice a day. To maximize pulmonary deposition, a spacer has been provided along with instructions for its proper administration with an HFA inhaler.  A prescription has been provided for montelukast 10 mg daily at bedtime.  Continue albuterol HFA, 1 to 2 inhalations every 4-6 hours as needed.  I have also  recommended using albuterol 15 minutes prior to exercise.  Subjective and objective measures of pulmonary function will be followed and the treatment plan will be adjusted accordingly.  Perennial allergic rhinitis  Aeroallergen avoidance measures have been discussed and provided in written form.  A prescription has been provided for carbinoxamine 4 mg every 6-8 hours if needed.  A prescription has been provided for azelastine nasal spray, 1-2 sprays per nostril 2 times daily as needed. Proper nasal spray technique has been discussed and demonstrated.   Nasal saline lavage (NeilMed) has been recommended as needed and prior to medicated nasal sprays along with instructions for proper administration.  If allergen avoidance measures and medications fail to adequately relieve symptoms, aeroallergen immunotherapy will be considered.  Acid reflux  Appropriate reflux lifestyle modifications have been discussed and provided in written form.  For now, continue Protonix as prescribed.  A prescription has been provided for famotidine (Pepcid) 20 mg twice daily.  Cough, persistent The most common causes of chronic cough include the following: upper airway cough syndrome (UACS) which is caused by variety of rhinosinus conditions; asthma; gastroesophageal reflux disease (GERD); chronic bronchitis from cigarette smoking or other inhaled environmental irritants; non-asthmatic eosinophilic bronchitis; and bronchiectasis. In prospective studies, these conditions have accounted for up to 94% of the causes of chronic cough in immunocompetent adults. The history and physical examination suggest that her cough is multifactorial with contribution from thick postnasal drainage, bronchial hyperresponsiveness, and acid reflux. We will address these issues at this time.   A prescription has been provided for a flutter valve to be used as needed to break the coughing cycle.  Treatment plan as outlined above.  We  will regroup in 6 weeks to assess treatment response and adjust therapy accordingly.   Meds ordered this encounter  Medications  . fluticasone (FLOVENT HFA) 110 MCG/ACT inhaler    Sig: Inhale 2 puffs into the lungs 2 (two) times daily.    Dispense:  1 Inhaler    Refill:  5  . montelukast (SINGULAIR) 10 MG tablet    Sig: Take 1 tablet (10 mg total) by mouth at bedtime.    Dispense:  30 tablet    Refill:  5  . albuterol (PROVENTIL HFA;VENTOLIN HFA) 108 (90 Base) MCG/ACT inhaler    Sig: Inhale 2 puffs into the lungs as needed for wheezing or shortness of breath (every 4 hours as needed for cough, wheezing and chest tightness).    Dispense:  18 g    Refill:  2  . Carbinoxamine Maleate 4 MG TABS    Sig: Take 1 tablet (4 mg total) by mouth every 6 (six) hours as needed.    Dispense:  30 each    Refill:  3  . Azelastine HCl 0.15 % SOLN    Sig: Place 2 sprays into both nostrils 2 (two) times daily.    Dispense:  30 mL    Refill:  5  . famotidine (PEPCID) 20 MG tablet    Sig: Take 1 tablet (20 mg total) by mouth 2 (two) times daily.    Dispense:  30 tablet    Refill:  3    Diagnostics: Spirometry: FVC was 2.06 L and FEV1 was 1.64 L (74% predicted) with 160 mL (10%) postbronchodilator improvement.  This study was performed while the patient was asymptomatic.  Please see scanned spirometry results for details. Epicutaneous testing: Positive to cat hair, dog epithelia, and dust mite antigen. Intradermal testing: Negative.    Physical examination: Blood pressure 126/72, pulse 70, temperature 98.1 F (36.7 C), temperature source Oral, resp. rate 16, height 5' 5.8" (1.671 m), weight 227 lb 3.2 oz (103.1 kg), SpO2 96 %.  General: Alert, interactive, in no acute distress. HEENT: TMs pearly gray, turbinates moderately edematous with thick discharge, post-pharynx moderately erythematous. Neck: Supple without lymphadenopathy. Lungs: Clear to auscultation without wheezing, rhonchi or  rales. CV: Normal S1, S2 without murmurs. Abdomen: Nondistended, nontender. Skin: Warm and dry, without lesions or rashes. Extremities:  No clubbing, cyanosis or edema. Neuro:   Grossly intact.  Review of systems:  Review of systems negative except as noted in HPI / PMHx or noted below: Review of Systems  Constitutional: Negative.   HENT: Negative.   Eyes: Negative.   Respiratory: Negative.   Cardiovascular: Negative.   Gastrointestinal: Negative.   Genitourinary: Negative.   Musculoskeletal: Negative.   Skin: Negative.   Neurological: Negative.   Endo/Heme/Allergies: Negative.   Psychiatric/Behavioral: Negative.     Past medical history:  Past Medical History:  Diagnosis Date  . Anxiety   . Arthritis    L HIP  . Asthma   . Avascular necrosis of hip (HCC)    LEFT  . Depression   . Diabetes mellitus   . Diabetes mellitus, type II ([place])   . Dry cough   . GERD (gastroesophageal reflux disease)   . Hypertension   . Insomnia    takes Ambien nightly  . Kidney disease   . Obesity   . Parkinsonism ([place]) 03/02/2017  . PONV (postoperative nausea and vomiting)   . RLS (restless legs syndrome) 01/15/2018  . TIA (transient ischemic attack) 2012   no residual  problems  . Urgency incontinence     Past surgical history:  Past Surgical History:  Procedure Laterality Date  . DILATION AND CURETTAGE OF UTERUS    . ESOPHAGEAL MANOMETRY N/A 09/03/2012   Procedure: ESOPHAGEAL MANOMETRY (EM);  Surgeon: Garlan Fair, MD;  Location: [facility];  Service: Endoscopy;  Laterality: N/A;  . ESOPHAGEAL MANOMETRY N/A 06/19/2017   Procedure: ESOPHAGEAL MANOMETRY (EM);  Surgeon: Clarene Essex, MD;  Location: [facility];  Service: Endoscopy;  Laterality: N/A;  . EXPLORATORY LAPAROTOMY    . JOINT REPLACEMENT  2011   rt total hip  . PILONIDAL CYST / SINUS EXCISION    . TOTAL HIP ARTHROPLASTY Left 10/09/2013   Procedure: LEFT TOTAL HIP ARTHROPLASTY ANTERIOR APPROACH;  Surgeon: [name], MD;  Location: [facility];  Service: Orthopedics;  Laterality: Left;    Family history: Family History  Problem Relation Age of Onset  . Alcohol abuse Brother   . Alcohol abuse Brother   . Alcohol abuse Brother   . Alcohol abuse Brother   . Allergic rhinitis Sister   . Diabetes Neg Hx   . Angioedema Neg Hx   . Asthma Neg Hx   . Eczema Neg Hx   . Immunodeficiency Neg Hx   . Urticaria Neg Hx     Social history: Social History   Socioeconomic History  . Marital status: Married    Spouse name: Not on file  . Number of children: Not on file  . Years of education: Not on file  . Highest education level: Not on file  Occupational History  . Not on file  Social Needs  . Financial resource strain: Not on file  . Food insecurity:    Worry: Not on file    Inability: Not on file  . Transportation needs:    Medical: Not on file    Non-medical: Not on file  Tobacco Use  . Smoking status: Never Smoker  . Smokeless tobacco: Never Used  Substance and Sexual Activity  . Alcohol use: No    Alcohol/week: 0.0 standard drinks  . Drug use: No  . Sexual activity: Not Currently  Lifestyle  . Physical activity:    Days per week: Not on file    Minutes per session: Not on file  . Stress: Not on file  Relationships  . Social connections:    Talks on phone: Not on file    Gets together: Not on file    Attends religious service: Not on file    Active member of club or organization: Not on file    Attends meetings of clubs or organizations: Not on file    Relationship status: Not on file  . Intimate partner violence:    Fear of current or ex partner: Not on file    Emotionally abused: Not on file    Physically abused: Not on file    Forced sexual activity: Not on file  Other Topics Concern  . Not on file  Social History Narrative   Lives with husband   Caffeine use: none   Right handed    Environmental History: The patient lives in a 63 year old house with carpeting throughout,  gas heat, and central air.  There are dogs in the home which do not have access to her bedroom.  There is no known mold/water damage in the home.  She is a non-smoker.  Allergies as of 02/08/2018      Reactions   Fluticasone-salmeterol Anaphylaxis   Codeine  Nausea And Vomiting   Fetzima [levomilnacipran] Nausea And Vomiting   Nsaids Other (See Comments)   Upset stomach    Trulicity [dulaglutide] Nausea And Vomiting   Significant and undesirably rapid (40 lbs) weight loss    Xanax [alprazolam] Other (See Comments)   disoriented   Amoxicillin Rash   Has patient had a PCN reaction causing immediate rash, facial/tongue/throat swelling, SOB or lightheadedness with hypotension: Yes Has patient had a PCN reaction causing severe rash involving mucus membranes or skin necrosis: No Has patient had a PCN reaction that required hospitalization: No Has patient had a PCN reaction occurring within the last 10 years: No If all of the above answers are "NO", then may proceed with Cephalosporin use.   Pseudoephedrine Hcl Er Palpitations      Medication List        Accurate as of 02/08/18  1:09 PM. Always use your most recent med list.          ACCU-CHEK FASTCLIX LANCETS Misc USE TO CHECK BLOOD SUGAR 3 TIMES A DAY   ACCU-CHEK GUIDE test strip Generic drug:  glucose blood USE TO CHECK BLOOD SUGAR 3 TIMES PER DAY.   albuterol 108 (90 Base) MCG/ACT inhaler Commonly known as:  PROVENTIL HFA;VENTOLIN HFA Inhale 2 puffs into the lungs as needed for wheezing or shortness of breath (every 4 hours as needed for cough, wheezing and chest tightness).   amLODipine 5 MG tablet Commonly known as:  NORVASC Take 5 mg by mouth every morning.   ARIPiprazole 2 MG tablet Commonly known as:  ABILIFY Take 2 mg by mouth daily.   aspirin EC 81 MG tablet Take 81 mg by mouth every evening.   atorvastatin 10 MG tablet Commonly known as:  LIPITOR Take 10 mg by mouth at bedtime.   Azelastine HCl 0.15 %  Soln Place 2 sprays into both nostrils 2 (two) times daily.   beclomethasone 80 MCG/ACT inhaler Commonly known as:  QVAR Inhale 1 puff into the lungs daily as needed.   benzonatate 200 MG capsule Commonly known as:  TESSALON Take 200 mg by mouth 3 (three) times daily.   buPROPion 150 MG 24 hr tablet Commonly known as:  WELLBUTRIN XL Take 450 mg by mouth every morning.   CALCIUM-VITAMIN D PO Take 1 tablet by mouth daily.   carbidopa-levodopa 25-100 MG tablet Commonly known as:  SINEMET IR 1/2 tablet three times a day for 4 weeks, then take one tablet three times a day   Carbinoxamine Maleate 4 MG Tabs Take 1 tablet (4 mg total) by mouth every 6 (six) hours as needed.   famotidine 20 MG tablet Commonly known as:  PEPCID Take 1 tablet (20 mg total) by mouth 2 (two) times daily.   fluticasone 110 MCG/ACT inhaler Commonly known as:  FLOVENT HFA Inhale 2 puffs into the lungs 2 (two) times daily.   furosemide 20 MG tablet Commonly known as:  LASIX Take 20 mg by mouth daily as needed for edema.   insulin glargine 100 UNIT/ML injection Commonly known as:  LANTUS Inject 10 Units into the skin every morning.   LORazepam 1 MG tablet Commonly known as:  ATIVAN Take 1 mg by mouth 4 (four) times daily.   losartan-hydrochlorothiazide 100-12.5 MG tablet Commonly known as:  HYZAAR Take 1 tablet by mouth daily.   metFORMIN 1000 MG (MOD) 24 hr tablet Commonly known as:  GLUMETZA Take 1,000 mg by mouth daily with breakfast.   montelukast 10 MG tablet Commonly  known as:  SINGULAIR Take 1 tablet (10 mg total) by mouth at bedtime.   NEURONTIN 300 MG capsule Generic drug:  gabapentin Take 300 mg by mouth 2 (two) times daily.   pantoprazole 40 MG tablet Commonly known as:  PROTONIX Take 40 mg by mouth 2 (two) times daily.   propranolol 40 MG tablet Commonly known as:  INDERAL Take 1 tablet (40 mg total) by mouth 2 (two) times daily.   rOPINIRole 2 MG tablet Commonly known  as:  REQUIP Take 2 mg by mouth 2 (two) times daily.   TRINTELLIX 20 MG Tabs tablet Generic drug:  vortioxetine HBr Take 20 mg by mouth daily.   valACYclovir 1000 MG tablet Commonly known as:  VALTREX Take 500 mg by mouth daily.   vitamin C 500 MG tablet Commonly known as:  ASCORBIC ACID Take 500 mg by mouth daily.   Vitamin D (Ergocalciferol) 50 MCG (2000 UT) Caps Take 1 capsule by mouth daily. 2000 units daily.   zolpidem 5 MG tablet Commonly known as:  AMBIEN Take 5 mg by mouth at bedtime.       Known medication allergies: Allergies  Allergen Reactions  . Fluticasone-Salmeterol Anaphylaxis  . Codeine Nausea And Vomiting  . Fetzima [Levomilnacipran] Nausea And Vomiting  . Nsaids Other (See Comments)    Upset stomach   . Trulicity [Dulaglutide] Nausea And Vomiting    Significant and undesirably rapid (40 lbs) weight loss   . Xanax [Alprazolam] Other (See Comments)    disoriented  . Amoxicillin Rash    Has patient had a PCN reaction causing immediate rash, facial/tongue/throat swelling, SOB or lightheadedness with hypotension: Yes Has patient had a PCN reaction causing severe rash involving mucus membranes or skin necrosis: No Has patient had a PCN reaction that required hospitalization: No Has patient had a PCN reaction occurring within the last 10 years: No If all of the above answers are "NO", then may proceed with Cephalosporin use.   . Pseudoephedrine Hcl Er Palpitations    I appreciate the opportunity to take part in Consuello's care. Please do not hesitate to contact me with questions.  Sincerely,   R. Edgar Frisk, MD

## 2018-02-08 NOTE — Patient Instructions (Addendum)
Moderate persistent asthma Currently with suboptimal control, we will step up therapy at this time.  The Qvar HFA that she has been using must be expired as this medication via the [facility] device was discontinued in early 2018.  A prescription has been provided for Flovent (fluticasone) 110 g,  2 inhalations twice a day. To maximize pulmonary deposition, a spacer has been provided along with instructions for its proper administration with an HFA inhaler.  A prescription has been provided for montelukast 10 mg daily at bedtime.  Continue albuterol HFA, 1 to 2 inhalations every 4-6 hours as needed.  I have also recommended using albuterol 15 minutes prior to exercise.  Subjective and objective measures of pulmonary function will be followed and the treatment plan will be adjusted accordingly.  Perennial allergic rhinitis  Aeroallergen avoidance measures have been discussed and provided in written form.  A prescription has been provided for carbinoxamine 4 mg every 6-8 hours if needed.  A prescription has been provided for azelastine nasal spray, 1-2 sprays per nostril 2 times daily as needed. Proper nasal spray technique has been discussed and demonstrated.   Nasal saline lavage (NeilMed) has been recommended as needed and prior to medicated nasal sprays along with instructions for proper administration.  If allergen avoidance measures and medications fail to adequately relieve symptoms, aeroallergen immunotherapy will be considered.  Acid reflux  Appropriate reflux lifestyle modifications have been discussed and provided in written form.  For now, continue Protonix as prescribed.  A prescription has been provided for famotidine (Pepcid) 20 mg twice daily.  Cough, persistent The most common causes of chronic cough include the following: upper airway cough syndrome (UACS) which is caused by variety of rhinosinus conditions; asthma; gastroesophageal reflux disease (GERD); chronic bronchitis  from cigarette smoking or other inhaled environmental irritants; non-asthmatic eosinophilic bronchitis; and bronchiectasis. In prospective studies, these conditions have accounted for up to 94% of the causes of chronic cough in immunocompetent adults. The history and physical examination suggest that her cough is multifactorial with contribution from thick postnasal drainage, bronchial hyperresponsiveness, and acid reflux. We will address these issues at this time.   A prescription has been provided for a flutter valve to be used as needed to break the coughing cycle.  Treatment plan as outlined above.    We will regroup in 6 weeks to assess treatment response and adjust therapy accordingly.   Return in about 6 weeks (around 03/22/2018), or if symptoms worsen or fail to improve.  Control of House Dust Mite Allergen  House dust mites play a major role in allergic asthma and rhinitis.  They occur in environments with high humidity wherever human skin, the food for dust mites is found. High levels have been detected in dust obtained from mattresses, pillows, carpets, upholstered furniture, bed covers, clothes and soft toys.  The principal allergen of the house dust mite is found in its feces.  A gram of dust may contain 1,000 mites and 250,000 fecal particles.  Mite antigen is easily measured in the air during house cleaning activities.    1. Encase mattresses, including the box spring, and pillow, in an air tight cover.  Seal the zipper end of the encased mattresses with wide adhesive tape. 2. Wash the bedding in water of 130 degrees Farenheit weekly.  Avoid cotton comforters/quilts and flannel bedding: the most ideal bed covering is the dacron comforter. 3. Remove all upholstered furniture from the bedroom. 4. Remove carpets, carpet padding, rugs, and non-washable window drapes from  the bedroom.  Wash drapes weekly or use plastic window coverings. 5. Remove all non-washable stuffed toys from the  bedroom.  Wash stuffed toys weekly. 6. Have the room cleaned frequently with a vacuum cleaner and a damp dust-mop.  The patient should not be in a room which is being cleaned and should wait 1 hour after cleaning before going into the room. 7. Close and seal all heating outlets in the bedroom.  Otherwise, the room will become filled with dust-laden air.  An electric heater can be used to heat the room. 8. Reduce indoor humidity to less than 50%.  Do not use a humidifier.  Control of Dog or Cat Allergen  Avoidance is the best way to manage a dog or cat allergy. If you have a dog or cat and are allergic to dog or cats, consider removing the dog or cat from the home. If you have a dog or cat but don't want to find it a new home, or if your family wants a pet even though someone in the household is allergic, here are some strategies that may help keep symptoms at bay:  1. Keep the pet out of your bedroom and restrict it to only a few rooms. Be advised that keeping the dog or cat in only one room will not limit the allergens to that room. 2. Don't pet, hug or kiss the dog or cat; if you do, wash your hands with soap and water. 3. High-efficiency particulate air (HEPA) cleaners run continuously in a bedroom or living room can reduce allergen levels over time. 4. Place electrostatic material sheet in the air inlet vent in the bedroom. 5. Regular use of a high-efficiency vacuum cleaner or a central vacuum can reduce allergen levels. 6. Giving your dog or cat a bath at least once a week can reduce airborne allergen.    Lifestyle Changes for Controlling GERD  When you have GERD, stomach acid feels as if it's backing up toward your mouth. Whether or not you take medication to control your GERD, your symptoms can often be improved with lifestyle changes.   Raise Your Head  Reflux is more likely to strike when you're lying down flat, because stomach fluid can  flow backward more easily. Raising the  head of your bed 4-6 inches can help. To do this:  Slide blocks or books under the legs at the head of your bed. Or, place a wedge under  the mattress. Many foam stores can make a suitable wedge for you. The wedge  should run from your waist to the top of your head.  Don't just prop your head on several pillows. This increases pressure on your  stomach. It can make GERD worse.  Watch Your Eating Habits Certain foods may increase the acid in your stomach or relax the lower esophageal sphincter, making GERD more likely. It's best to avoid the following:  Coffee, tea, and carbonated drinks (with and without caffeine)  Fatty, fried, or spicy food  Mint, chocolate, onions, and tomatoes  Any other foods that seem to irritate your stomach or cause you pain  Relieve the Pressure  Eat smaller meals, even if you have to eat more often.  Don't lie down right after you eat. Wait a few hours for your stomach to empty.  Avoid tight belts and tight-fitting clothes.  Lose excess weight.  Tobacco and Alcohol  Avoid smoking tobacco and drinking alcohol. They can make GERD symptoms worse.

## 2018-02-08 NOTE — Assessment & Plan Note (Addendum)
   Aeroallergen avoidance measures have been discussed and provided in written form.  A prescription has been provided for carbinoxamine 4 mg every 6-8 hours if needed.  A prescription has been provided for azelastine nasal spray, 1-2 sprays per nostril 2 times daily as needed. Proper nasal spray technique has been discussed and demonstrated.   Nasal saline lavage (NeilMed) has been recommended as needed and prior to medicated nasal sprays along with instructions for proper administration.  If allergen avoidance measures and medications fail to adequately relieve symptoms, aeroallergen immunotherapy will be considered.

## 2018-02-08 NOTE — Assessment & Plan Note (Addendum)
Currently with suboptimal control, we will step up therapy at this time.  The Qvar HFA that she has been using must be expired as this medication via the [facility] device was discontinued in early 2018.  A prescription has been provided for Flovent (fluticasone) 110 g, 2 inhalations twice a day. To maximize pulmonary deposition, a spacer has been provided along with instructions for its proper administration with an HFA inhaler.  A prescription has been provided for montelukast 10 mg daily at bedtime.  Continue albuterol HFA, 1 to 2 inhalations every 4-6 hours as needed.  I have also recommended using albuterol 15 minutes prior to exercise.  Subjective and objective measures of pulmonary function will be followed and the treatment plan will be adjusted accordingly.

## 2018-02-08 NOTE — Assessment & Plan Note (Signed)
   Appropriate reflux lifestyle modifications have been discussed and provided in written form.  For now, continue Protonix as prescribed.  A prescription has been provided for famotidine (Pepcid) 20 mg twice daily.

## 2018-02-08 NOTE — Addendum Note (Signed)
Addended by: Isabel Caprice on: 02/08/2018 01:39 PM   Modules accepted: Orders

## 2018-02-09 DIAGNOSIS — H5213 Myopia, bilateral: Secondary | ICD-10-CM | POA: Diagnosis not present

## 2018-02-09 MED FILL — REPAGLINIDE 2 MG TABLET: 2 | 30 days supply | Qty: 120 | Fill #0

## 2018-02-12 ENCOUNTER — Telehealth: Payer: Self-pay | Admitting: Allergy

## 2018-02-12 ENCOUNTER — Other Ambulatory Visit: Payer: Self-pay | Admitting: Allergy

## 2018-02-12 NOTE — Telephone Encounter (Signed)
Patient called and wanted to know how to take the Carbinoxamine Maleate 4 mg. I informed  her every six hours as needed.

## 2018-02-13 DIAGNOSIS — F251 Schizoaffective disorder, depressive type: Secondary | ICD-10-CM | POA: Diagnosis not present

## 2018-02-13 MED FILL — GABAPENTIN 300 MG CAPSULE: 300 | 90 days supply | Qty: 180 | Fill #0

## 2018-02-15 MED FILL — ATORVASTATIN 10 MG TABLET: 10 | 90 days supply | Qty: 90 | Fill #1

## 2018-02-16 MED FILL — REXULTI 0.5 MG TABLET: 0.5 | 90 days supply | Qty: 90 | Fill #0

## 2018-02-19 ENCOUNTER — Ambulatory Visit: Payer: Self-pay

## 2018-02-19 DIAGNOSIS — R350 Frequency of micturition: Secondary | ICD-10-CM | POA: Diagnosis not present

## 2018-02-19 DIAGNOSIS — R3915 Urgency of urination: Secondary | ICD-10-CM | POA: Diagnosis not present

## 2018-02-19 DIAGNOSIS — N3941 Urge incontinence: Secondary | ICD-10-CM | POA: Diagnosis not present

## 2018-02-19 MED FILL — PROPRANOLOL 40 MG TABLET: 40 | 90 days supply | Qty: 180 | Fill #1

## 2018-02-19 MED FILL — ZOLPIDEM TARTRATE 5 MG TAB: 5 | 90 days supply | Qty: 90 | Fill #1

## 2018-02-19 MED FILL — CARBIDOPA/LEVO 25/100 TAB: 25-100 | 30 days supply | Qty: 90 | Fill #1

## 2018-02-19 MED FILL — rOPINIRole HCL 2 MG TABS: 2 | 90 days supply | Qty: 180 | Fill #1

## 2018-02-19 MED FILL — CARBINOXAMINE MALEATE 4 MG: 4 | 7 days supply | Qty: 30 | Fill #1

## 2018-02-21 MED FILL — FAMOTIDINE 20 MG TABLET: 20 | 15 days supply | Qty: 30 | Fill #1

## 2018-02-22 MED FILL — PANTOPRAZOLE SOD DR 40 MG T: 40 | 45 days supply | Qty: 90 | Fill #0

## 2018-02-26 MED FILL — TECHLITE PEN NDL 32GX1/4: 32G X 6 MM | 90 days supply | Qty: 100 | Fill #1

## 2018-02-26 MED FILL — TECHLITE PEN NDL 32GX1/4": 32G X 6 MM | 90 days supply | Qty: 100 | Fill #1

## 2018-02-27 ENCOUNTER — Other Ambulatory Visit: Payer: Self-pay | Admitting: Endocrinology

## 2018-02-27 MED FILL — ACCU-CHEK FASTCLIX LANCETS: 34 days supply | Qty: 102 | Fill #0

## 2018-02-27 NOTE — Telephone Encounter (Signed)
Last OV 11/25/16 4 canceled and no future scheduled refill or refuse please advise

## 2018-02-27 NOTE — Telephone Encounter (Signed)
Please refill x 1 Ov is due  

## 2018-02-28 DIAGNOSIS — E119 Type 2 diabetes mellitus without complications: Secondary | ICD-10-CM | POA: Diagnosis not present

## 2018-02-28 DIAGNOSIS — N183 Chronic kidney disease, stage 3 (moderate): Secondary | ICD-10-CM | POA: Diagnosis not present

## 2018-02-28 DIAGNOSIS — E042 Nontoxic multinodular goiter: Secondary | ICD-10-CM | POA: Diagnosis not present

## 2018-02-28 MED FILL — HUMALOG MIX 50-50 KWIKPEN: (50-50) 100 | 86 days supply | Qty: 12 | Fill #0

## 2018-03-12 MED FILL — REPAGLINIDE 2 MG TABLET: 2 | 30 days supply | Qty: 120 | Fill #1

## 2018-03-12 MED FILL — FAMOTIDINE 20 MG TABLET: 20 | 15 days supply | Qty: 30 | Fill #2

## 2018-03-12 MED FILL — LORazepam 2 MG TABS: 2 | 90 days supply | Qty: 270 | Fill #0

## 2018-03-12 MED FILL — CARBINOXAMINE MALEATE 4 MG: 4 | 7 days supply | Qty: 30 | Fill #2

## 2018-03-12 MED FILL — buPROPion HCL ER (XL) 150 M: 150 | 90 days supply | Qty: 270 | Fill #1

## 2018-03-12 MED FILL — MONTELUKAST SOD 10 MG TAB: 10 | 30 days supply | Qty: 30 | Fill #1

## 2018-03-12 MED FILL — metFORMIN HCL 500 MG TABS: 500 | 30 days supply | Qty: 60 | Fill #3

## 2018-03-17 DIAGNOSIS — R7309 Other abnormal glucose: Secondary | ICD-10-CM | POA: Diagnosis not present

## 2018-03-17 DIAGNOSIS — R12 Heartburn: Secondary | ICD-10-CM | POA: Diagnosis not present

## 2018-03-18 ENCOUNTER — Encounter (HOSPITAL_BASED_OUTPATIENT_CLINIC_OR_DEPARTMENT_OTHER): Payer: Self-pay | Admitting: Adult Health

## 2018-03-18 ENCOUNTER — Emergency Department (HOSPITAL_BASED_OUTPATIENT_CLINIC_OR_DEPARTMENT_OTHER): Payer: 59

## 2018-03-18 ENCOUNTER — Emergency Department (HOSPITAL_BASED_OUTPATIENT_CLINIC_OR_DEPARTMENT_OTHER)
Admission: EM | Admit: 2018-03-18 | Discharge: 2018-03-19 | Disposition: A | Discharge status: Home / Self Care | Payer: 59 | Attending: Emergency Medicine | Admitting: Emergency Medicine

## 2018-03-18 ENCOUNTER — Other Ambulatory Visit: Payer: Self-pay

## 2018-03-18 DIAGNOSIS — R1110 Vomiting, unspecified: Secondary | ICD-10-CM | POA: Diagnosis not present

## 2018-03-18 DIAGNOSIS — N183 Chronic kidney disease, stage 3 (moderate): Secondary | ICD-10-CM | POA: Diagnosis not present

## 2018-03-18 DIAGNOSIS — I129 Hypertensive chronic kidney disease with stage 1 through stage 4 chronic kidney disease, or unspecified chronic kidney disease: Secondary | ICD-10-CM | POA: Diagnosis not present

## 2018-03-18 DIAGNOSIS — E1122 Type 2 diabetes mellitus with diabetic chronic kidney disease: Secondary | ICD-10-CM | POA: Diagnosis not present

## 2018-03-18 DIAGNOSIS — R1084 Generalized abdominal pain: Secondary | ICD-10-CM

## 2018-03-18 DIAGNOSIS — F329 Major depressive disorder, single episode, unspecified: Secondary | ICD-10-CM | POA: Insufficient documentation

## 2018-03-18 DIAGNOSIS — Z7982 Long term (current) use of aspirin: Secondary | ICD-10-CM | POA: Insufficient documentation

## 2018-03-18 DIAGNOSIS — R197 Diarrhea, unspecified: Secondary | ICD-10-CM | POA: Diagnosis not present

## 2018-03-18 DIAGNOSIS — G20 Parkinson's disease: Secondary | ICD-10-CM | POA: Insufficient documentation

## 2018-03-18 DIAGNOSIS — K529 Noninfective gastroenteritis and colitis, unspecified: Secondary | ICD-10-CM | POA: Diagnosis not present

## 2018-03-18 DIAGNOSIS — Z8673 Personal history of transient ischemic attack (TIA), and cerebral infarction without residual deficits: Secondary | ICD-10-CM | POA: Insufficient documentation

## 2018-03-18 DIAGNOSIS — Z96643 Presence of artificial hip joint, bilateral: Secondary | ICD-10-CM | POA: Insufficient documentation

## 2018-03-18 DIAGNOSIS — J45909 Unspecified asthma, uncomplicated: Secondary | ICD-10-CM | POA: Insufficient documentation

## 2018-03-18 DIAGNOSIS — F419 Anxiety disorder, unspecified: Secondary | ICD-10-CM | POA: Insufficient documentation

## 2018-03-18 DIAGNOSIS — Z79899 Other long term (current) drug therapy: Secondary | ICD-10-CM | POA: Insufficient documentation

## 2018-03-18 DIAGNOSIS — Z794 Long term (current) use of insulin: Secondary | ICD-10-CM | POA: Diagnosis not present

## 2018-03-18 DIAGNOSIS — R112 Nausea with vomiting, unspecified: Secondary | ICD-10-CM | POA: Diagnosis present

## 2018-03-18 LAB — COMPREHENSIVE METABOLIC PANEL
ALT: 7 U/L (ref 0–44)
AST: 23 U/L (ref 15–41)
Albumin: 4 g/dL (ref 3.5–5.0)
Alkaline Phosphatase: 40 U/L (ref 38–126)
Anion gap: 9 (ref 5–15)
BUN: 19 mg/dL (ref 8–23)
CO2: 28 mmol/L (ref 22–32)
Calcium: 9.5 mg/dL (ref 8.9–10.3)
Chloride: 101 mmol/L (ref 98–111)
Creatinine, Ser: 1.42 mg/dL — ABNORMAL HIGH (ref 0.44–1.00)
GFR calc Af Amer: 45 mL/min — ABNORMAL LOW (ref >60)
GFR calc non Af Amer: 39 mL/min — ABNORMAL LOW (ref >60)
Glucose, Bld: 124 mg/dL — ABNORMAL HIGH (ref 70–99)
Potassium: 4 mmol/L (ref 3.5–5.1)
Sodium: 138 mmol/L (ref 135–145)
Total Bilirubin: 0.4 mg/dL (ref 0.3–1.2)
Total Protein: 8 g/dL (ref 6.5–8.1)

## 2018-03-18 LAB — URINALYSIS, ROUTINE W REFLEX MICROSCOPIC
Bilirubin Urine: NEGATIVE
Glucose, UA: NEGATIVE mg/dL
Hgb urine dipstick: NEGATIVE
Ketones, ur: NEGATIVE mg/dL
Nitrite: NEGATIVE
Protein, ur: NEGATIVE mg/dL
Specific Gravity, Urine: 1.02 (ref 1.005–1.030)
pH: 6 (ref 5.0–8.0)

## 2018-03-18 LAB — CBC
HCT: 37.7 % (ref 36.0–46.0)
Hemoglobin: 12 g/dL (ref 12.0–15.0)
MCH: 32.3 pg (ref 26.0–34.0)
MCHC: 31.8 g/dL (ref 30.0–36.0)
MCV: 101.6 fL — ABNORMAL HIGH (ref 80.0–100.0)
Platelets: 283 10*3/uL (ref 150–400)
RBC: 3.71 MIL/uL — ABNORMAL LOW (ref 3.87–5.11)
RDW: 13.6 % (ref 11.5–15.5)
WBC: 8.5 10*3/uL (ref 4.0–10.5)
nRBC: 0 % (ref 0.0–0.2)

## 2018-03-18 LAB — URINALYSIS, MICROSCOPIC (REFLEX)

## 2018-03-18 LAB — LIPASE, BLOOD: Lipase: 33 U/L (ref 11–51)

## 2018-03-18 MED ORDER — ONDANSETRON HCL 4 MG/2ML IJ SOLN
4.0000 mg | Freq: Once | INTRAMUSCULAR | Status: AC
  Administered 2018-03-18: 4 mg via INTRAVENOUS
  Filled 2018-03-18: qty 2

## 2018-03-18 MED ORDER — ONDANSETRON 4 MG PO TBDP: 4.0000 mg | ORAL_TABLET | Freq: Three times a day (TID) | ORAL | 0 refills | Status: DC | PRN

## 2018-03-18 MED ORDER — SODIUM CHLORIDE 0.9 % IV BOLUS
1000.0000 mL | Freq: Once | INTRAVENOUS | Status: AC
  Administered 2018-03-18: 1000 mL via INTRAVENOUS

## 2018-03-18 MED ORDER — IOPAMIDOL (ISOVUE-300) INJECTION 61%
100.0000 mL | Freq: Once | INTRAVENOUS | Status: AC | PRN
  Administered 2018-03-18: 80 mL via INTRAVENOUS

## 2018-03-18 NOTE — ED Notes (Signed)
CBG= 87; edit sheet filled out for this test

## 2018-03-18 NOTE — ED Notes (Signed)
Patient left at this time with all belongings. 

## 2018-03-18 NOTE — ED Triage Notes (Signed)
Presents with generalized abdominal pain that began today associted with n/v/d. Last emesis was at 7pm emesis x4 today and diarrhea x3 today. Her sugars have been up and down as well. CBG 87 here.

## 2018-03-18 NOTE — ED Provider Notes (Signed)
[facility] EMERGENCY DEPARTMENT Provider Note   CSN: [id number] Arrival date & time: 03/18/18  1947     History   Chief Complaint Chief Complaint  Patient presents with  . Abdominal Pain    HPI Deanna Rowe is a 62 y.o. female.  HPI   Presents with concern for nausea, vomiting and diarrhea Had hyperglycemia earlier today, improved at urgent care Today all day had worsening abdominal pain, diarrhea, vomiting Fatigue, generalized weakness Yesterday began to have shaking sweating, blurred vision and high sugar, then sugars improved and n/v/d began today Diarrhea 4 times Threw up twice No black or bloody stools No fever No sick contacts, no known exposures, had chick fil a and ihop, no recent abx, no dysuria.   Past Medical History:  Diagnosis Date  . Anxiety   . Arthritis    L HIP  . Asthma   . Avascular necrosis of hip (HCC)    LEFT  . Depression   . Diabetes mellitus   . Diabetes mellitus, type II ([place])   . Dry cough   . GERD (gastroesophageal reflux disease)   . Hypertension   . Insomnia    takes Ambien nightly  . Kidney disease   . Obesity   . Parkinsonism ([place]) 03/02/2017  . PONV (postoperative nausea and vomiting)   . RLS (restless legs syndrome) 01/15/2018  . TIA (transient ischemic attack) 2012   no residual problems  . Urgency incontinence     Patient Active Problem List   Diagnosis Date Noted  . Encounter for routine screening for malformation using ultrasonics 02/08/2018  . Abnormal weight gain 02/08/2018  . Acid indigestion 02/08/2018  . Acute antritis 02/08/2018  . Adiposity 02/08/2018  . Perennial allergic rhinitis 02/08/2018  . Antiphospholipid syndrome ([place]) 02/08/2018  . Arthralgia of hip or thigh 02/08/2018  . Asthma, cough variant 02/08/2018  . Avitaminosis D 02/08/2018  . Backhand tennis elbow 02/08/2018  . Cannot sleep 02/08/2018  . Chronic kidney disease (CKD), stage III (moderate) ([place]) 02/08/2018  . Current  drug use 02/08/2018  . Depression, major, single episode, in partial remission ([place]) 02/08/2018  . Diabetes mellitus with polyneuropathy ([place]) 02/08/2018  . Difficulty hearing 02/08/2018  . Disturbance of skin sensation 02/08/2018  . Essential (primary) hypertension 02/08/2018  . Extremity pain 02/08/2018  . Facet syndrome, lumbar 02/08/2018  . Factor V Leiden ([place]) 02/08/2018  . Fungal infection of nail 02/08/2018  . Gastroenteritis and colitis, viral 02/08/2018  . Hypoglycemia 02/08/2018  . Menopause 02/08/2018  . Other long term (current) drug therapy 02/08/2018  . Other osteonecrosis, unspecified femur ([place]) 02/08/2018  . Pure hypercholesterolemia 02/08/2018  . Recurrent major depressive episodes ([place]) 02/08/2018  . Syncope and collapse 02/08/2018  . Moderate persistent asthma 02/08/2018  . Cough, persistent 02/08/2018  . Restless leg 01/15/2018  . Degeneration of lumbar intervertebral disc 11/21/2017  . Tremor of right hand 03/02/2017  . Parkinsonism ([place]) 03/02/2017  . UTI (urinary tract infection) 12/01/2015  . Controlled type 2 diabetes mellitus without complication ([place]) 32/20/2542  . Fast heart beat 04/09/2015  . 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 Postoperative anemia due to acute blood loss 10/10/2013  . Avascular necrosis of bone of left hip ([place]) 10/09/2013  . Arthritis of pelvic region, degenerative 10/09/2013  . Breath shortness 06/18/2013  . Diabetes mellitus type 2, uncontrolled ([place]) 05/31/2013  . Intermittent claudication ([place]) 04/04/2013  . Anxiety   . Acid reflux 09/12/2012  . Clinical  depression 02/21/2012  . Stroke ([place]) 02/11/2012  . History of revision of total replacement of right hip joint 04/04/2009    Past Surgical History:  Procedure Laterality Date  . DILATION AND CURETTAGE OF UTERUS    . ESOPHAGEAL MANOMETRY N/A 09/03/2012   Procedure: ESOPHAGEAL MANOMETRY (EM);  Surgeon: Garlan Fair, MD;  Location: [facility]  ENDOSCOPY;  Service: Endoscopy;  Laterality: N/A;  . ESOPHAGEAL MANOMETRY N/A 06/19/2017   Procedure: ESOPHAGEAL MANOMETRY (EM);  Surgeon: Clarene Essex, MD;  Location: [facility] ENDOSCOPY;  Service: Endoscopy;  Laterality: N/A;  . EXPLORATORY LAPAROTOMY    . JOINT REPLACEMENT  2011   rt total hip  . PILONIDAL CYST / SINUS EXCISION    . TOTAL HIP ARTHROPLASTY Left 10/09/2013   Procedure: LEFT TOTAL HIP ARTHROPLASTY ANTERIOR APPROACH;  Surgeon: [name], MD;  Location: [facility] ORS;  Service: Orthopedics;  Laterality: Left;     OB History   No obstetric history on file.      Home Medications    Prior to Admission medications   Medication Sig Start Date End Date Taking? Authorizing Provider  ACCU-CHEK FASTCLIX LANCETS MISC USE TO CHECK BLOOD SUGAR 3 TIMES A DAY 02/27/18   Renato Shin, MD  ACCU-CHEK GUIDE test strip USE TO CHECK BLOOD SUGAR 3 TIMES PER DAY. 02/02/17   Renato Shin, MD  albuterol (PROVENTIL HFA;VENTOLIN HFA) 108 (90 Base) MCG/ACT inhaler Inhale 2 puffs into the lungs as needed for wheezing or shortness of breath. 02/08/18   Bobbitt, [place] Muta, MD  amLODipine (NORVASC) 5 MG tablet Take 5 mg by mouth every morning.     [provider]  ARIPiprazole (ABILIFY) 2 MG tablet Take 2 mg by mouth daily.    [provider]  aspirin EC 81 MG tablet Take 81 mg by mouth every evening.    [provider]  atorvastatin (LIPITOR) 10 MG tablet Take 10 mg by mouth at bedtime. 12/05/17   [provider]  azelastine (ASTELIN) 0.1 % nasal spray Place 2 sprays into both nostrils 2 (two) times daily. Use in each nostril as directed 02/08/18   Bobbitt, [place] Muta, MD  Azelastine HCl 0.15 % SOLN Place 2 sprays into both nostrils 2 (two) times daily. 02/08/18   Bobbitt, [place] Muta, MD  beclomethasone (QVAR) 80 MCG/ACT inhaler Inhale 1 puff into the lungs daily as needed.    [provider]  benzonatate (TESSALON) 200 MG capsule Take 200 mg by mouth 3 (three)  times daily. 12/23/17   [provider]  buPROPion (WELLBUTRIN XL) 150 MG 24 hr tablet Take 450 mg by mouth every morning.     [provider]  CALCIUM-VITAMIN D PO Take 1 tablet by mouth daily.    [provider]  carbidopa-levodopa (SINEMET IR) 25-100 MG tablet 1/2 tablet three times a day for 4 weeks, then take one tablet three times a day 01/15/18   Kathrynn Ducking, MD  Carbinoxamine Maleate 4 MG TABS Take 1 tablet (4 mg total) by mouth every 6 (six) hours as needed. 02/08/18   Bobbitt, [place] Muta, MD  famotidine (PEPCID) 20 MG tablet Take 1 tablet (20 mg total) by mouth 2 (two) times daily. 02/08/18   Bobbitt, [place] Muta, MD  fluticasone (FLOVENT HFA) 110 MCG/ACT inhaler Inhale 2 puffs into the lungs 2 (two) times daily. 02/08/18   Bobbitt, [place] Muta, MD  furosemide (LASIX) 20 MG tablet Take 20 mg by mouth daily as needed for edema.  Leighton Ruff, MD  gabapentin (NEURONTIN) 300 MG capsule Take 300 mg by mouth 2 (two) times daily.     [provider]  insulin glargine (LANTUS) 100 UNIT/ML injection Inject 10 Units into the skin every morning.    [provider]  LORazepam (ATIVAN) 1 MG tablet Take 1 mg by mouth 4 (four) times daily.    Ricard Dillon, MD  losartan-hydrochlorothiazide [facility]) 100-12.5 MG tablet Take 1 tablet by mouth daily.    [provider]  metFORMIN (GLUMETZA) 1000 MG (MOD) 24 hr tablet Take 1,000 mg by mouth daily with breakfast.    [provider]  montelukast (SINGULAIR) 10 MG tablet Take 1 tablet (10 mg total) by mouth at bedtime. 02/08/18   Bobbitt, [place] Muta, MD  ondansetron (ZOFRAN ODT) 4 MG disintegrating tablet Take 1 tablet (4 mg total) by mouth every 8 (eight) hours as needed for nausea or vomiting. 03/18/18   Gareth Morgan, MD  pantoprazole (PROTONIX) 40 MG tablet Take 40 mg by mouth 2 (two) times daily.     [provider]  propranolol (INDERAL) 40 MG tablet Take 1  tablet (40 mg total) by mouth 2 (two) times daily. 12/05/17   Lyda Jester M, PA-C  rOPINIRole (REQUIP) 2 MG tablet Take 2 mg by mouth 2 (two) times daily. 03/16/15   [provider]  valACYclovir (VALTREX) 1000 MG tablet Take 500 mg by mouth daily.     [provider]  vitamin C (ASCORBIC ACID) 500 MG tablet Take 500 mg by mouth daily.    [provider]  Vitamin D, Ergocalciferol, 2000 units CAPS Take 1 capsule by mouth daily. 2000 units daily.    [provider]  vortioxetine HBr (TRINTELLIX) 20 MG TABS Take 20 mg by mouth daily.    [provider]  zolpidem (AMBIEN) 5 MG tablet Take 5 mg by mouth at bedtime.     [provider]    Family History Family History  Problem Relation Age of Onset  . Alcohol abuse Brother   . Alcohol abuse Brother   . Alcohol abuse Brother   . Alcohol abuse Brother   . Allergic rhinitis Sister   . Diabetes Neg Hx   . Angioedema Neg Hx   . Asthma Neg Hx   . Eczema Neg Hx   . Immunodeficiency Neg Hx   . Urticaria Neg Hx     Social History Social History   Tobacco Use  . Smoking status: Never Smoker  . Smokeless tobacco: Never Used  Substance Use Topics  . Alcohol use: No    Alcohol/week: 0.0 standard drinks  . Drug use: No     Allergies   Fluticasone-salmeterol; Codeine; Fetzima [levomilnacipran]; Nsaids; Trulicity [dulaglutide]; Xanax [alprazolam]; Amoxicillin; and Pseudoephedrine hcl er   Review of Systems Review of Systems  Constitutional: Positive for fatigue. Negative for fever.  HENT: Negative for sore throat.   Eyes: Negative for visual disturbance.  Respiratory: Negative for cough and shortness of breath.   Cardiovascular: Negative for chest pain.  Gastrointestinal: Positive for abdominal pain, diarrhea, nausea and vomiting.  Genitourinary: Negative for difficulty urinating and dysuria.  Musculoskeletal: Negative for back pain and neck pain.  Skin: Negative for rash.    Neurological: Negative for syncope and headaches.     Physical Exam Updated Vital Signs BP 140/78   Pulse 62   Temp 98.5 F (36.9 C) (Oral)   Resp 18   Ht 5\' 6"  (1.676 m)   Wt  103.9 kg   SpO2 98%   BMI 36.96 kg/m   Physical Exam Vitals signs and nursing note reviewed.  Constitutional:      General: She is not in acute distress.    Appearance: She is well-developed. She is not diaphoretic.  HENT:     Head: Normocephalic and atraumatic.  Eyes:     Conjunctiva/sclera: Conjunctivae normal.  Neck:     Musculoskeletal: Normal range of motion.  Cardiovascular:     Rate and Rhythm: Normal rate and regular rhythm.     Heart sounds: Normal heart sounds. No murmur. No friction rub. No gallop.   Pulmonary:     Effort: Pulmonary effort is normal. No respiratory distress.     Breath sounds: Normal breath sounds. No wheezing or rales.  Abdominal:     General: There is no distension.     Palpations: Abdomen is soft.     Tenderness: There is abdominal tenderness in the right upper quadrant. There is no guarding.  Musculoskeletal:        General: No tenderness.  Skin:    General: Skin is warm and dry.     Findings: No erythema or rash.  Neurological:     Mental Status: She is alert and oriented to person, place, and time.      ED Treatments / Results  Labs (all labs ordered are listed, but only abnormal results are displayed) Labs Reviewed  COMPREHENSIVE METABOLIC PANEL - Abnormal; Notable for the following components:      Result Value   Glucose, Bld 124 (*)    Creatinine, Ser 1.42 (*)    GFR calc non Af Amer 39 (*)    GFR calc Af Amer 45 (*)    All other components within normal limits  CBC - Abnormal; Notable for the following components:   RBC 3.71 (*)    MCV 101.6 (*)    All other components within normal limits  URINALYSIS, ROUTINE W REFLEX MICROSCOPIC - Abnormal; Notable for the following components:   Leukocytes, UA SMALL (*)    All other components within  normal limits  URINALYSIS, MICROSCOPIC (REFLEX) - Abnormal; Notable for the following components:   Bacteria, UA MANY (*)    All other components within normal limits  LIPASE, BLOOD  CBG MONITORING, ED    EKG None  Radiology Ct Abdomen Pelvis W Contrast  Result Date: 03/18/2018 CLINICAL DATA:  Generalized abdominal pain, nausea, vomiting and diarrhea tonight. History of exploratory laparotomy and hip replacement, diverticulosis. EXAM: CT ABDOMEN AND PELVIS WITH CONTRAST TECHNIQUE: Multidetector CT imaging of the abdomen and pelvis was performed using the standard protocol following bolus administration of intravenous contrast. CONTRAST:  6mL ISOVUE-300 IOPAMIDOL (ISOVUE-300) INJECTION 61% COMPARISON:  CT abdomen and pelvis September 14, 2017 FINDINGS: LOWER CHEST: Lung bases are clear. Included heart size is normal. No pericardial effusion. HEPATOBILIARY: Liver and gallbladder are normal. PANCREAS: Normal. SPLEEN: Normal. ADRENALS/URINARY TRACT: Kidneys are orthotopic, demonstrating symmetric enhancement. No nephrolithiasis, hydronephrosis or solid renal masses. The unopacified ureters are normal in course and caliber. Delayed imaging through the kidneys demonstrates symmetric prompt contrast excretion within the proximal urinary collecting system. Urinary bladder is adequately distended and unremarkable. Normal adrenal glands. STOMACH/BOWEL: The stomach, small and large bowel are normal in course and caliber without inflammatory changes, sensitivity decreased without oral contrast. Mild colonic diverticulosis. Normal appendix. VASCULAR/LYMPHATIC: Aortoiliac vessels are normal in course and caliber. Mild calcific atherosclerosis. No lymphadenopathy by CT size criteria. REPRODUCTIVE: Normal. OTHER: No intraperitoneal free  fluid or free air. Small fat containing umbilical hernia. MUSCULOSKELETAL: Nonacute. Streak artifact from bilateral total hip arthroplasties. Advanced lower lumbar facet arthropathy.  IMPRESSION: 1. Colonic diverticulosis without acute diverticulitis nor acute intra-abdominal/pelvic process. Normal appendix. Aortic Atherosclerosis (ICD10-I70.0). Electronically Signed   By: Elon Alas M.D.   On: 03/18/2018 22:54    Procedures Procedures (including critical care time)  Medications Ordered in ED Medications  sodium chloride 0.9 % bolus 1,000 mL (0 mLs Intravenous Stopped 03/18/18 2210)  ondansetron (ZOFRAN) injection 4 mg (4 mg Intravenous Given 03/18/18 2103)  iopamidol (ISOVUE-300) 61 % injection 100 mL (80 mLs Intravenous Contrast Given 03/18/18 2229)     Initial Impression / Assessment and Plan / ED Course  I have reviewed the triage vital signs and the nursing notes.  Pertinent labs & imaging results that were available during my care of the patient were reviewed by me and considered in my medical decision making (see chart for details).     62 year old female with history above, including history of diabetes and hypertension presents with concern for nausea, vomiting, diarrhea and abdominal pain.  Patient had also reported hyperglycemia yesterday.  Labs completed show no sign of diabetic ketoacidosis.  Urinalysis shows bacteria, however no white blood cells, feel this is likely contaminant.  Labs without significant electrolyte abnormality.  Patient with right-sided tenderness on exam, ultrasound will CT abdomen done which shows no evidence of appendicitis, and shows normal-appearing gallbladder.  Symptoms likely represent viral gastroenteritis.  Given IV fluids, prescription for Zofran. Patient discharged in stable condition with understanding of reasons to return.   Final Clinical Impressions(s) / ED Diagnoses   Final diagnoses:  Gastroenteritis  Generalized abdominal pain    ED Discharge Orders         Ordered    ondansetron (ZOFRAN ODT) 4 MG disintegrating tablet  Every 8 hours PRN     03/18/18 2311           Gareth Morgan, MD 03/19/18  0128

## 2018-03-18 NOTE — ED Notes (Signed)
ED Provider at bedside. 

## 2018-03-19 LAB — CBG MONITORING, ED: Glucose-Capillary: 87 mg/dL (ref 70–99)

## 2018-03-20 DIAGNOSIS — N3941 Urge incontinence: Secondary | ICD-10-CM | POA: Diagnosis not present

## 2018-03-21 DIAGNOSIS — I700 Atherosclerosis of aorta: Secondary | ICD-10-CM | POA: Diagnosis not present

## 2018-03-21 DIAGNOSIS — E042 Nontoxic multinodular goiter: Secondary | ICD-10-CM | POA: Diagnosis not present

## 2018-03-21 DIAGNOSIS — R634 Abnormal weight loss: Secondary | ICD-10-CM | POA: Diagnosis not present

## 2018-03-21 DIAGNOSIS — N183 Chronic kidney disease, stage 3 (moderate): Secondary | ICD-10-CM | POA: Diagnosis not present

## 2018-03-21 DIAGNOSIS — R112 Nausea with vomiting, unspecified: Secondary | ICD-10-CM | POA: Diagnosis not present

## 2018-03-21 DIAGNOSIS — R197 Diarrhea, unspecified: Secondary | ICD-10-CM | POA: Diagnosis not present

## 2018-03-21 DIAGNOSIS — E119 Type 2 diabetes mellitus without complications: Secondary | ICD-10-CM | POA: Diagnosis not present

## 2018-03-21 DIAGNOSIS — D7589 Other specified diseases of blood and blood-forming organs: Secondary | ICD-10-CM | POA: Diagnosis not present

## 2018-03-22 DIAGNOSIS — D7589 Other specified diseases of blood and blood-forming organs: Secondary | ICD-10-CM | POA: Diagnosis not present

## 2018-03-22 DIAGNOSIS — E119 Type 2 diabetes mellitus without complications: Secondary | ICD-10-CM | POA: Diagnosis not present

## 2018-03-22 DIAGNOSIS — R634 Abnormal weight loss: Secondary | ICD-10-CM | POA: Diagnosis not present

## 2018-03-22 DIAGNOSIS — R197 Diarrhea, unspecified: Secondary | ICD-10-CM | POA: Diagnosis not present

## 2018-03-22 DIAGNOSIS — R112 Nausea with vomiting, unspecified: Secondary | ICD-10-CM | POA: Diagnosis not present

## 2018-03-26 MED FILL — FAMOTIDINE 20 MG TABLET: 20 | 15 days supply | Qty: 30 | Fill #3

## 2018-03-26 MED FILL — ACCU-CHEK FASTCLIX LANCETS: 34 days supply | Qty: 102 | Fill #0

## 2018-03-29 ENCOUNTER — Ambulatory Visit
Admission: RE | Admit: 2018-03-29 | Discharge: 2018-03-29 | Disposition: A | Discharge status: Home / Self Care | Payer: 59 | Source: Ambulatory Visit | Attending: Family Medicine | Admitting: Family Medicine

## 2018-03-29 DIAGNOSIS — Z1231 Encounter for screening mammogram for malignant neoplasm of breast: Secondary | ICD-10-CM

## 2018-04-02 MED FILL — VALACYCLOVIR HCL 500 MG TAB: 500 | 90 days supply | Qty: 90 | Fill #1

## 2018-04-02 MED FILL — PANTOPRAZOLE SOD DR 40 MG T: 40 | 45 days supply | Qty: 90 | Fill #0

## 2018-04-02 MED FILL — CARBIDOPA/LEVO 25/100 TAB: 25-100 | 30 days supply | Qty: 90 | Fill #2

## 2018-04-02 MED FILL — TRINTELLIX 20 MG TABLET: 20 | 90 days supply | Qty: 90 | Fill #0

## 2018-04-05 DIAGNOSIS — R3914 Feeling of incomplete bladder emptying: Secondary | ICD-10-CM | POA: Diagnosis not present

## 2018-04-05 DIAGNOSIS — N3941 Urge incontinence: Secondary | ICD-10-CM | POA: Diagnosis not present

## 2018-04-11 ENCOUNTER — Other Ambulatory Visit: Payer: Self-pay | Admitting: Allergy and Immunology

## 2018-04-11 DIAGNOSIS — F251 Schizoaffective disorder, depressive type: Secondary | ICD-10-CM | POA: Diagnosis not present

## 2018-04-11 MED FILL — FAMOTIDINE 20 MG TABLET: 20 | 15 days supply | Qty: 30 | Fill #0

## 2018-04-11 MED FILL — MONTELUKAST SOD 10 MG TAB: 10 | 30 days supply | Qty: 30 | Fill #2

## 2018-04-18 MED FILL — LOSARTAN-HCTZ 100-12.5 MG T: 100-12.5 | 90 days supply | Qty: 90 | Fill #0

## 2018-04-18 MED FILL — AMLODIPINE BESYLATE 5 MG TA: 5 | 90 days supply | Qty: 90 | Fill #0

## 2018-04-19 DIAGNOSIS — I700 Atherosclerosis of aorta: Secondary | ICD-10-CM | POA: Diagnosis not present

## 2018-04-19 MED FILL — ACCU-CHEK GUIDE STRP: 90 days supply | Qty: 300 | Fill #3

## 2018-04-20 MED FILL — HUMALOG 100 UNITS/ML KWIKPE: 100 | 75 days supply | Qty: 9 | Fill #0

## 2018-04-25 MED FILL — ACCU-CHEK FASTCLIX LANCETS: 34 days supply | Qty: 102 | Fill #1

## 2018-04-27 DIAGNOSIS — M7061 Trochanteric bursitis, right hip: Secondary | ICD-10-CM | POA: Insufficient documentation

## 2018-05-03 ENCOUNTER — Ambulatory Visit (INDEPENDENT_AMBULATORY_CARE_PROVIDER_SITE_OTHER): Payer: 59 | Admitting: Allergy and Immunology

## 2018-05-03 ENCOUNTER — Encounter: Payer: Self-pay | Admitting: Allergy and Immunology

## 2018-05-03 VITALS — BP 122/74 | HR 74 | Temp 97.5°F | Resp 16

## 2018-05-03 DIAGNOSIS — K219 Gastro-esophageal reflux disease without esophagitis: Secondary | ICD-10-CM | POA: Diagnosis not present

## 2018-05-03 DIAGNOSIS — J4540 Moderate persistent asthma, uncomplicated: Secondary | ICD-10-CM | POA: Diagnosis not present

## 2018-05-03 DIAGNOSIS — R05 Cough: Secondary | ICD-10-CM | POA: Diagnosis not present

## 2018-05-03 DIAGNOSIS — J3089 Other allergic rhinitis: Secondary | ICD-10-CM

## 2018-05-03 DIAGNOSIS — R053 Chronic cough: Secondary | ICD-10-CM

## 2018-05-03 MED ORDER — MONTELUKAST SODIUM 10 MG PO TABS: 10.0000 mg | ORAL_TABLET | Freq: Every day | ORAL | 5 refills | Status: DC

## 2018-05-03 MED ORDER — FLUTICASONE PROPIONATE HFA 220 MCG/ACT IN AERO: 2.0000 | INHALATION_SPRAY | Freq: Two times a day (BID) | RESPIRATORY_TRACT | 5 refills | Status: DC

## 2018-05-03 MED ORDER — CARBINOXAMINE MALEATE 4 MG PO TABS: 4.0000 mg | ORAL_TABLET | Freq: Three times a day (TID) | ORAL | 5 refills | Status: DC | PRN

## 2018-05-03 MED ORDER — FAMOTIDINE 20 MG PO TABS: 20.0000 mg | ORAL_TABLET | Freq: Two times a day (BID) | ORAL | 5 refills | Status: DC

## 2018-05-03 MED FILL — CARBINOXAMINE MALEATE 4 MG: 4 | 20 days supply | Qty: 60 | Fill #0

## 2018-05-03 MED FILL — FLOVENT HFA 220 MCG INHALER: 220 | 30 days supply | Qty: 12 | Fill #0

## 2018-05-03 MED FILL — FAMOTIDINE 20 MG TABLET: 20 | 30 days supply | Qty: 60 | Fill #0

## 2018-05-03 NOTE — Assessment & Plan Note (Signed)
   Continue appropriate aeroallergen avoidance measures.  Restart carbinoxamine 4 mg every 6-8 hours if needed.  A refill prescription has been provided.  Continue azelastine nasal spray, 1-2 sprays per nostril 2 times daily as needed.   Nasal saline lavage (NeilMed) has been recommended as needed and prior to medicated nasal sprays along with instructions for proper administration.  If allergen avoidance measures and medications fail to adequately relieve symptoms, aeroallergen immunotherapy will be considered.

## 2018-05-03 NOTE — Assessment & Plan Note (Addendum)
Currently with suboptimal control, we will step up therapy at this time.  Restart montelukast 10 mg daily at bedtime.  A refill prescription has been provided.  A prescription has been provided for Symbicort (budesonide/formoterol) 160/4.5 g, 2 inhalations via spacer device twice a day.  Continue albuterol HFA, 1 to 2 inhalations every 4-6 hours if needed.  Subjective and objective measures of pulmonary function will be followed and the treatment plan will be adjusted accordingly.

## 2018-05-03 NOTE — Assessment & Plan Note (Signed)
Multifactorial.  Treatment plan as outlined above.  Start using flutter valve as needed.  Information has been provided as to acquiring a flutter valve.

## 2018-05-03 NOTE — Assessment & Plan Note (Signed)
   Continue appropriate reflux lifestyle modifications and Protonix as prescribed.  Restart famotidine (Pepcid) 20 mg twice daily.  A refill prescription has been provided.

## 2018-05-03 NOTE — Patient Instructions (Addendum)
Moderate persistent asthma Currently with suboptimal control, we will step up therapy at this time.  Restart montelukast 10 mg daily at bedtime.  A refill prescription has been provided.  A prescription has been provided for Symbicort (budesonide/formoterol) 160/4.5 g,  2 inhalations via spacer device twice a day.  Continue albuterol HFA, 1 to 2 inhalations every 4-6 hours if needed.  Subjective and objective measures of pulmonary function will be followed and the treatment plan will be adjusted accordingly.  Perennial allergic rhinitis  Continue appropriate aeroallergen avoidance measures.  Restart carbinoxamine 4 mg every 6-8 hours if needed.  A refill prescription has been provided.  Continue azelastine nasal spray, 1-2 sprays per nostril 2 times daily as needed.   Nasal saline lavage (NeilMed) has been recommended as needed and prior to medicated nasal sprays along with instructions for proper administration.  If allergen avoidance measures and medications fail to adequately relieve symptoms, aeroallergen immunotherapy will be considered.  Acid reflux  Continue appropriate reflux lifestyle modifications and Protonix as prescribed.  Restart famotidine (Pepcid) 20 mg twice daily.  A refill prescription has been provided.  Cough, persistent Multifactorial.  Treatment plan as outlined above.  Start using flutter valve as needed.  Information has been provided as to acquiring a flutter valve.   Return in about 2 months (around 07/02/2018), or if symptoms worsen or fail to improve.

## 2018-05-03 NOTE — Progress Notes (Signed)
Follow-up Note  RE: Deanna Rowe MRN: [id number] DOB: 1955-05-15 Date of Office Visit: 05/03/2018  Primary care provider: Leighton Ruff, MD Referring provider: Leighton Ruff, MD  History of present illness: Deanna Rowe is a 63 y.o. female with persistent asthma, allergic rhinitis, acid reflux, and a persistent cough presenting today for follow-up.  She is currently taking Flovent 110 g, 2 inhalations via spacer device twice daily.  She states that her cough "is not as bad", particularly at nighttime, however the cough and wheezing has persisted.  She states that she did not continue taking montelukast, famotidine, or carbinoxamine after the initial 30-day prescription ran out.  Is no nasal allergy symptom complaints today.  She also admits that she never aquired a flutter valve as recommended.  Assessment and plan: Moderate persistent asthma Currently with suboptimal control, we will step up therapy at this time.  Restart montelukast 10 mg daily at bedtime.  A refill prescription has been provided.  A prescription has been provided for Symbicort (budesonide/formoterol) 160/4.5 g,  2 inhalations via spacer device twice a day.  Continue albuterol HFA, 1 to 2 inhalations every 4-6 hours if needed.  Subjective and objective measures of pulmonary function will be followed and the treatment plan will be adjusted accordingly.  Perennial allergic rhinitis  Continue appropriate aeroallergen avoidance measures.  Restart carbinoxamine 4 mg every 6-8 hours if needed.  A refill prescription has been provided.  Continue azelastine nasal spray, 1-2 sprays per nostril 2 times daily as needed.   Nasal saline lavage (NeilMed) has been recommended as needed and prior to medicated nasal sprays along with instructions for proper administration.  If allergen avoidance measures and medications fail to adequately relieve symptoms, aeroallergen immunotherapy will be  considered.  Acid reflux  Continue appropriate reflux lifestyle modifications and Protonix as prescribed.  Restart famotidine (Pepcid) 20 mg twice daily.  A refill prescription has been provided.  Cough, persistent Multifactorial.  Treatment plan as outlined above.  Start using flutter valve as needed.  Information has been provided as to acquiring a flutter valve.   Meds ordered this encounter  Medications  . Carbinoxamine Maleate 4 MG TABS    Sig: Take 1 tablet (4 mg total) by mouth every 8 (eight) hours as needed.    Dispense:  60 each    Refill:  5  . famotidine (PEPCID) 20 MG tablet    Sig: Take 1 tablet (20 mg total) by mouth 2 (two) times daily.    Dispense:  60 tablet    Refill:  5  . fluticasone (FLOVENT HFA) 220 MCG/ACT inhaler    Sig: Inhale 2 puffs into the lungs 2 (two) times daily.    Dispense:  1 Inhaler    Refill:  5  . montelukast (SINGULAIR) 10 MG tablet    Sig: Take 1 tablet (10 mg total) by mouth at bedtime.    Dispense:  30 tablet    Refill:  5    Diagnostics: Spirometry reveals an FVC of 2.03 L and an FEV1 of 1.65 L, FEV1 ratio 103%.  This study was performed while the patient was asymptomatic.  Please see scanned spirometry results for details.    Physical examination: Blood pressure 122/74, pulse 74, temperature (!) 97.5 F (36.4 C), temperature source Oral, resp. rate 16.  General: Alert, interactive, in no acute distress. HEENT: TMs pearly gray, turbinates moderately edematous without discharge, post-pharynx moderately erythematous. Neck: Supple without lymphadenopathy. Lungs: Clear to auscultation without wheezing, rhonchi  or rales. CV: Normal S1, S2 without murmurs. Skin: Warm and dry, without lesions or rashes.  The following portions of the patient's history were reviewed and updated as appropriate: allergies, current medications, past family history, past medical history, past social history, past surgical history and problem  list.  Allergies as of 05/03/2018      Reactions   Fluticasone-salmeterol Anaphylaxis   Codeine Nausea And Vomiting   Fetzima [levomilnacipran] Nausea And Vomiting   Nsaids Other (See Comments)   Upset stomach    Trulicity [dulaglutide] Nausea And Vomiting   Significant and undesirably rapid (40 lbs) weight loss    Xanax [alprazolam] Other (See Comments)   disoriented   Amoxicillin Rash   Has patient had a PCN reaction causing immediate rash, facial/tongue/throat swelling, SOB or lightheadedness with hypotension: Yes Has patient had a PCN reaction causing severe rash involving mucus membranes or skin necrosis: No Has patient had a PCN reaction that required hospitalization: No Has patient had a PCN reaction occurring within the last 10 years: No If all of the above answers are "NO", then may proceed with Cephalosporin use.   Pseudoephedrine Hcl Er Palpitations      Medication List       Accurate as of May 03, 2018  9:15 PM. Always use your most recent med list.        ACCU-CHEK FASTCLIX LANCETS Misc USE TO CHECK BLOOD SUGAR 3 TIMES A DAY   ACCU-CHEK GUIDE test strip Generic drug:  glucose blood USE TO CHECK BLOOD SUGAR 3 TIMES PER DAY.   albuterol 108 (90 Base) MCG/ACT inhaler Commonly known as:  PROVENTIL HFA;VENTOLIN HFA Inhale 2 puffs into the lungs as needed for wheezing or shortness of breath.   amLODipine 5 MG tablet Commonly known as:  NORVASC Take 5 mg by mouth every morning.   ARIPiprazole 2 MG tablet Commonly known as:  ABILIFY Take 2 mg by mouth daily.   aspirin EC 81 MG tablet Take 81 mg by mouth every evening.   atorvastatin 10 MG tablet Commonly known as:  LIPITOR Take 10 mg by mouth at bedtime.   azelastine 0.1 % nasal spray Commonly known as:  ASTELIN Place 2 sprays into both nostrils 2 (two) times daily. Use in each nostril as directed   beclomethasone 80 MCG/ACT inhaler Commonly known as:  QVAR Inhale 1 puff into the lungs daily as  needed.   benzonatate 200 MG capsule Commonly known as:  TESSALON Take 200 mg by mouth 3 (three) times daily.   buPROPion 150 MG 24 hr tablet Commonly known as:  WELLBUTRIN XL Take 450 mg by mouth every morning.   CALCIUM-VITAMIN D PO Take 1 tablet by mouth daily.   carbidopa-levodopa 25-100 MG tablet Commonly known as:  SINEMET IR 1/2 tablet three times a day for 4 weeks, then take one tablet three times a day   Carbinoxamine Maleate 4 MG Tabs Take 1 tablet (4 mg total) by mouth every 6 (six) hours as needed.   Carbinoxamine Maleate 4 MG Tabs Take 1 tablet (4 mg total) by mouth every 8 (eight) hours as needed.   famotidine 20 MG tablet Commonly known as:  PEPCID TAKE 1 TABLET (20 MG TOTAL) BY MOUTH 2 (TWO) TIMES DAILY.   famotidine 20 MG tablet Commonly known as:  PEPCID Take 1 tablet (20 mg total) by mouth 2 (two) times daily.   fluticasone 220 MCG/ACT inhaler Commonly known as:  FLOVENT HFA Inhale 2 puffs into the lungs 2 (  two) times daily.   insulin glargine 100 UNIT/ML injection Commonly known as:  LANTUS Inject 10 Units into the skin every morning.   LORazepam 1 MG tablet Commonly known as:  ATIVAN Take 1 mg by mouth 4 (four) times daily.   losartan-hydrochlorothiazide 100-12.5 MG tablet Commonly known as:  HYZAAR Take 1 tablet by mouth daily.   montelukast 10 MG tablet Commonly known as:  SINGULAIR Take 1 tablet (10 mg total) by mouth at bedtime.   montelukast 10 MG tablet Commonly known as:  SINGULAIR Take 1 tablet (10 mg total) by mouth at bedtime.   NEURONTIN 300 MG capsule Generic drug:  gabapentin Take 300 mg by mouth 2 (two) times daily.   pantoprazole 40 MG tablet Commonly known as:  PROTONIX Take 40 mg by mouth 2 (two) times daily.   propranolol 40 MG tablet Commonly known as:  INDERAL Take 1 tablet (40 mg total) by mouth 2 (two) times daily.   rOPINIRole 2 MG tablet Commonly known as:  REQUIP Take 2 mg by mouth 2 (two) times  daily.   TRINTELLIX 20 MG Tabs tablet Generic drug:  vortioxetine HBr Take 20 mg by mouth daily.   valACYclovir 1000 MG tablet Commonly known as:  VALTREX Take 500 mg by mouth daily.   vitamin C 500 MG tablet Commonly known as:  ASCORBIC ACID Take 500 mg by mouth daily.   Vitamin D (Ergocalciferol) 50 MCG (2000 UT) Caps Take 1 capsule by mouth daily. 2000 units daily.   zolpidem 5 MG tablet Commonly known as:  AMBIEN Take 5 mg by mouth at bedtime.       Allergies  Allergen Reactions  . Fluticasone-Salmeterol Anaphylaxis  . Codeine Nausea And Vomiting  . Fetzima [Levomilnacipran] Nausea And Vomiting  . Nsaids Other (See Comments)    Upset stomach   . Trulicity [Dulaglutide] Nausea And Vomiting    Significant and undesirably rapid (40 lbs) weight loss   . Xanax [Alprazolam] Other (See Comments)    disoriented  . Amoxicillin Rash    Has patient had a PCN reaction causing immediate rash, facial/tongue/throat swelling, SOB or lightheadedness with hypotension: Yes Has patient had a PCN reaction causing severe rash involving mucus membranes or skin necrosis: No Has patient had a PCN reaction that required hospitalization: No Has patient had a PCN reaction occurring within the last 10 years: No If all of the above answers are "NO", then may proceed with Cephalosporin use.   . Pseudoephedrine Hcl Er Palpitations   Review of systems: Review of systems negative except as noted in HPI / PMHx or noted below: Constitutional: Negative.  HENT: Negative.   Eyes: Negative.  Respiratory: Negative.   Cardiovascular: Negative.  Gastrointestinal: Negative.  Genitourinary: Negative.  Musculoskeletal: Negative.  Neurological: Negative.  Endo/Heme/Allergies: Negative.  Cutaneous: Negative.  Past Medical History:  Diagnosis Date  . Anxiety   . Arthritis    L HIP  . Asthma   . Avascular necrosis of hip (HCC)    LEFT  . Depression   . Diabetes mellitus   . Diabetes mellitus,  type II ([place])   . Dry cough   . GERD (gastroesophageal reflux disease)   . Hypertension   . Insomnia    takes Ambien nightly  . Kidney disease   . Obesity   . Parkinsonism ([place]) 03/02/2017  . PONV (postoperative nausea and vomiting)   . RLS (restless legs syndrome) 01/15/2018  . TIA (transient ischemic attack) 2012   no residual problems  .  Urgency incontinence     Family History  Problem Relation Age of Onset  . Alcohol abuse Brother   . Alcohol abuse Brother   . Alcohol abuse Brother   . Alcohol abuse Brother   . Allergic rhinitis Sister   . Diabetes Neg Hx   . Angioedema Neg Hx   . Asthma Neg Hx   . Eczema Neg Hx   . Immunodeficiency Neg Hx   . Urticaria Neg Hx     Social History   Socioeconomic History  . Marital status: Married    Spouse name: Not on file  . Number of children: Not on file  . Years of education: Not on file  . Highest education level: Not on file  Occupational History  . Not on file  Social Needs  . Financial resource strain: Not on file  . Food insecurity:    Worry: Not on file    Inability: Not on file  . Transportation needs:    Medical: Not on file    Non-medical: Not on file  Tobacco Use  . Smoking status: Never Smoker  . Smokeless tobacco: Never Used  Substance and Sexual Activity  . Alcohol use: No    Alcohol/week: 0.0 standard drinks  . Drug use: No  . Sexual activity: Not Currently  Lifestyle  . Physical activity:    Days per week: Not on file    Minutes per session: Not on file  . Stress: Not on file  Relationships  . Social connections:    Talks on phone: Not on file    Gets together: Not on file    Attends religious service: Not on file    Active member of club or organization: Not on file    Attends meetings of clubs or organizations: Not on file    Relationship status: Not on file  . Intimate partner violence:    Fear of current or ex partner: Not on file    Emotionally abused: Not on file    Physically  abused: Not on file    Forced sexual activity: Not on file  Other Topics Concern  . Not on file  Social History Narrative   Lives with husband   Caffeine use: none   Right handed     I appreciate the opportunity to take part in Kateria's care. Please do not hesitate to contact me with questions.  Sincerely,   R. Edgar Frisk, MD

## 2018-05-04 ENCOUNTER — Telehealth: Payer: Self-pay | Admitting: Cardiology

## 2018-05-04 NOTE — Telephone Encounter (Signed)
Thanks Legrand Como, I agree. Let her know that it is not too uncommon to have some degree of atherosclerosis. She had a stress test in 2017 that was low risk for ischemia, meaning that the heart muscle tissue appeared to have a good supply of blood from the heart arteries, suggesting no significant blockages at the time. She should let us know if she develops any symptoms that might be worrisome for blockages in the future, such as chest discomfort (pressure, tightness, burning, heaviness, indigestion like pain) especially with physical activity or shortness of breath w/ activity. If these symptoms develop, we can do a repeat stress test or coronary CT. For now, I recommend a heart healthy diet, low fat, low salt, active lifestyle and keep BP and cholesterol under good control. Regular yearly f/u with PCP and yearly f/u in our office.

## 2018-05-04 NOTE — Telephone Encounter (Signed)
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

## 2018-05-04 NOTE — Telephone Encounter (Signed)
Lpm with Brittainy's recommendations.  I told her to call with any concerns.

## 2018-05-04 NOTE — Telephone Encounter (Signed)
I spoke to the patient who has concerns because she had an Abdominal CT on 03/18/18 and she was told that she had "mild calcific atherosclerosis" noted.    I eased her mind as I explained what they meant by that and that if it was something they felt needed to be addressed, they would have.  I told her that she should continue taking all of her prescribed medications and that I would forward our conversation to [facility], if further advice is warranted.

## 2018-05-07 ENCOUNTER — Telehealth: Payer: Self-pay

## 2018-05-07 MED FILL — MONTELUKAST SOD 10 MG TAB: 10 | 30 days supply | Qty: 30 | Fill #0

## 2018-05-07 NOTE — Telephone Encounter (Signed)
I spoke to the patient late Friday 05/04/18 and we discussed in details Brittainy's message to her.  She was thankful for the call.

## 2018-05-08 MED FILL — CARBIDOPA/LEVO 25/100 TAB: 25-100 | 30 days supply | Qty: 90 | Fill #3

## 2018-05-09 MED FILL — TECHLITE PEN NDL 32GX1/4: 32G X 6 MM | 90 days supply | Qty: 100 | Fill #2

## 2018-05-09 MED FILL — TECHLITE PEN NDL 32GX1/4": 32G X 6 MM | 90 days supply | Qty: 100 | Fill #2

## 2018-05-09 MED FILL — ULTICARE ALCOHOL SWABS PADS: 20 days supply | Qty: 100 | Fill #0

## 2018-05-15 MED FILL — REXULTI 0.5 MG TABLET: 0.5 | 90 days supply | Qty: 90 | Fill #0

## 2018-05-15 MED FILL — PROPRANOLOL 40 MG TABLET: 40 | 90 days supply | Qty: 180 | Fill #2

## 2018-05-16 MED FILL — LANTUS SOLOSTAR 100 UNITS/M: 100 | 30 days supply | Qty: 15 | Fill #0

## 2018-05-23 MED FILL — GABAPENTIN 300 MG CAPSULE: 300 | 90 days supply | Qty: 180 | Fill #0

## 2018-05-23 MED FILL — ZOLPIDEM TARTRATE 5 MG TAB: 5 | 90 days supply | Qty: 90 | Fill #0

## 2018-05-23 MED FILL — rOPINIRole HCL 2 MG TABS: 2 | 90 days supply | Qty: 180 | Fill #0

## 2018-05-23 MED FILL — PANTOPRAZOLE SOD DR 40 MG T: 40 | 45 days supply | Qty: 90 | Fill #0

## 2018-05-23 MED FILL — ATORVASTATIN 10 MG TABLET: 10 | 90 days supply | Qty: 90 | Fill #2

## 2018-05-25 MED FILL — ACCU-CHEK FASTCLIX LANCETS: 34 days supply | Qty: 102 | Fill #2

## 2018-06-04 DIAGNOSIS — R350 Frequency of micturition: Secondary | ICD-10-CM | POA: Diagnosis not present

## 2018-06-04 DIAGNOSIS — N3941 Urge incontinence: Secondary | ICD-10-CM | POA: Diagnosis not present

## 2018-06-04 DIAGNOSIS — N183 Chronic kidney disease, stage 3 (moderate): Secondary | ICD-10-CM | POA: Diagnosis not present

## 2018-06-04 DIAGNOSIS — N2581 Secondary hyperparathyroidism of renal origin: Secondary | ICD-10-CM | POA: Diagnosis not present

## 2018-06-04 DIAGNOSIS — N189 Chronic kidney disease, unspecified: Secondary | ICD-10-CM | POA: Diagnosis not present

## 2018-06-04 MED FILL — MYRBETRIQ ER 50 MG TABLET: 50 | 30 days supply | Qty: 30 | Fill #0

## 2018-06-04 MED FILL — FAMOTIDINE 20 MG TABLET: 20 | 30 days supply | Qty: 60 | Fill #1

## 2018-06-04 MED FILL — ULTICARE ALCOHOL SWABS PADS: 20 days supply | Qty: 100 | Fill #1

## 2018-06-07 DIAGNOSIS — E042 Nontoxic multinodular goiter: Secondary | ICD-10-CM | POA: Diagnosis not present

## 2018-06-07 DIAGNOSIS — I700 Atherosclerosis of aorta: Secondary | ICD-10-CM | POA: Diagnosis not present

## 2018-06-07 DIAGNOSIS — E119 Type 2 diabetes mellitus without complications: Secondary | ICD-10-CM | POA: Diagnosis not present

## 2018-06-07 DIAGNOSIS — N183 Chronic kidney disease, stage 3 (moderate): Secondary | ICD-10-CM | POA: Diagnosis not present

## 2018-06-08 DIAGNOSIS — D631 Anemia in chronic kidney disease: Secondary | ICD-10-CM | POA: Diagnosis not present

## 2018-06-08 DIAGNOSIS — N2581 Secondary hyperparathyroidism of renal origin: Secondary | ICD-10-CM | POA: Diagnosis not present

## 2018-06-08 DIAGNOSIS — I129 Hypertensive chronic kidney disease with stage 1 through stage 4 chronic kidney disease, or unspecified chronic kidney disease: Secondary | ICD-10-CM | POA: Diagnosis not present

## 2018-06-08 DIAGNOSIS — N183 Chronic kidney disease, stage 3 (moderate): Secondary | ICD-10-CM | POA: Diagnosis not present

## 2018-06-08 MED FILL — HUMALOG 100 UNITS/ML KWIKPE: 100 | 72 days supply | Qty: 15 | Fill #0

## 2018-06-11 ENCOUNTER — Other Ambulatory Visit: Payer: Self-pay | Admitting: Neurology

## 2018-06-11 MED FILL — CARBIDOPA/LEVO 25/100 TAB: 25-100 | 30 days supply | Qty: 90 | Fill #0

## 2018-06-13 MED FILL — CARBINOXAMINE MALEATE 4 MG: 4 | 20 days supply | Qty: 60 | Fill #1

## 2018-06-13 MED FILL — MONTELUKAST SOD 10 MG TAB: 10 | 30 days supply | Qty: 30 | Fill #1

## 2018-06-13 MED FILL — buPROPion HCL ER (XL) 150 M: 150 | 90 days supply | Qty: 270 | Fill #0

## 2018-06-13 MED FILL — LORAZEPAM 2 MG TABS: 2 | 90 days supply | Qty: 270 | Fill #0

## 2018-06-22 MED FILL — TECHLITE PEN NDL 32GX1/4: 32G X 6 MM | 90 days supply | Qty: 100 | Fill #3

## 2018-06-22 MED FILL — ACCU-CHEK FASTCLIX LANCETS: 34 days supply | Qty: 102 | Fill #3

## 2018-06-22 MED FILL — TECHLITE PEN NDL 32GX1/4": 32G X 6 MM | 90 days supply | Qty: 100 | Fill #3

## 2018-06-27 MED FILL — REXULTI 2 MG TABLET: 2 | 90 days supply | Qty: 90 | Fill #0

## 2018-06-29 MED FILL — FAMOTIDINE 20 MG TABLET: 20 | 30 days supply | Qty: 60 | Fill #2

## 2018-06-29 MED FILL — PANTOPRAZOLE SOD DR 40 MG T: 40 | 45 days supply | Qty: 90 | Fill #0

## 2018-06-29 MED FILL — VALACYCLOVIR HCL 500 MG TAB: 500 | 90 days supply | Qty: 90 | Fill #2

## 2018-06-29 MED FILL — TRINTELLIX 20 MG TABLET: 20 | 90 days supply | Qty: 90 | Fill #1

## 2018-07-04 ENCOUNTER — Other Ambulatory Visit: Payer: Self-pay

## 2018-07-04 ENCOUNTER — Encounter: Payer: Self-pay | Admitting: Allergy and Immunology

## 2018-07-04 ENCOUNTER — Ambulatory Visit (INDEPENDENT_AMBULATORY_CARE_PROVIDER_SITE_OTHER): Payer: 59 | Admitting: Allergy and Immunology

## 2018-07-04 ENCOUNTER — Telehealth: Payer: Self-pay | Admitting: Allergy and Immunology

## 2018-07-04 DIAGNOSIS — J4540 Moderate persistent asthma, uncomplicated: Secondary | ICD-10-CM | POA: Diagnosis not present

## 2018-07-04 DIAGNOSIS — J3089 Other allergic rhinitis: Secondary | ICD-10-CM

## 2018-07-04 DIAGNOSIS — K219 Gastro-esophageal reflux disease without esophagitis: Secondary | ICD-10-CM

## 2018-07-04 DIAGNOSIS — R053 Chronic cough: Secondary | ICD-10-CM

## 2018-07-04 DIAGNOSIS — R05 Cough: Secondary | ICD-10-CM | POA: Diagnosis not present

## 2018-07-04 MED ORDER — FLUTICASONE PROPIONATE HFA 110 MCG/ACT IN AERO: 2.0000 | INHALATION_SPRAY | Freq: Two times a day (BID) | RESPIRATORY_TRACT | 5 refills | Status: DC

## 2018-07-04 NOTE — Patient Instructions (Signed)
Moderate persistent asthma Well-controlled, we will stepdown therapy at this time.  A prescription has been provided for Flovent 110  g,  2 inhalations via spacer device twice a day.  If lower respiratory symptoms progress in frequency and/or severity, the patient is to resume the previous dose of Flovent 220 g.  Continue montelukast 10 mg daily at bedtime and albuterol HFA, 1 to 2 inhalations every 4-6 hours if needed.  Subjective and objective measures of pulmonary function will be followed and the treatment plan will be adjusted accordingly.  Perennial allergic rhinitis  Continue appropriate aeroallergen avoidance measures, carbinoxamine 4 mg every 6-8 hours if needed, and azelastine nasal spray, 1-2 sprays per nostril 2 times daily as needed.   Nasal saline lavage (NeilMed) has been recommended as needed and prior to medicated nasal sprays along with instructions for proper administration.  If allergen avoidance measures and medications fail to adequately relieve symptoms, aeroallergen immunotherapy will be considered.  Acid reflux  Continue appropriate reflux lifestyle modifications, pantoprazole as prescribed, and famotidine (Pepcid) 20 mg twice daily.    Cough, persistent Improved.  Treatment plan as outlined above.   Return in about 4 months (around 11/03/2018), or if symptoms worsen or fail to improve.

## 2018-07-04 NOTE — Progress Notes (Signed)
Follow-up Telemedicine Note  RE: Deanna Rowe MRN: [id number] DOB: 10/09/1955 Date of Telemedicine Visit: 07/04/2018  Primary care provider: Leighton Ruff, MD Referring provider: Leighton Ruff, MD  Telemedicine Follow Up Visit via Telephone: I connected with Deanna Rowe for a follow up on 07/04/18 by telephone and verified that I am speaking with the correct person using two identifiers.   I discussed the limitations, risks, security and privacy concerns of performing an evaluation and management service by telephone and the availability of in person appointments. I also discussed with the patient that there may be a patient responsible charge related to this service. The patient expressed understanding and agreed to proceed.  Patient is at home .  Provider is at office.  Visit start time: 11:17 am Visit end time: 11:44 am Insurance consent/check in by: [place] consent and medical assistant/nurse: [place]  History of present illness: Deanna Rowe is a 63 y.o. female with persistent asthma, allergic rhinitis, acid reflux, and history of persistent cough presented today for follow-up.  She is last seen in this clinic on May 03, 2018. She reports that in the interval since her previous visit her asthma has been "doing much better" and the persistent cough has resolved.  She is currently taking Flovent 220 g, 1-2 inhalation via spacer device twice daily, and montelukast 10 mg daily at bedtime.  She rarely requires albuterol rescue and does not experience limitations in normal daily activities or nocturnal awakenings due to lower respiratory symptoms. She reports that her nose has been "much clearer" while using azelastine nasal spray and carbinoxamine as needed.  Her reflux has been well controlled with pantoprazole and famotidine.  Assessment and plan: Moderate persistent asthma Well-controlled, we will stepdown therapy at this time.  A  prescription has been provided for Flovent 110  g,  2 inhalations via spacer device twice a day.  If lower respiratory symptoms progress in frequency and/or severity, the patient is to resume the previous dose of Flovent 220 g.  Continue montelukast 10 mg daily at bedtime and albuterol HFA, 1 to 2 inhalations every 4-6 hours if needed.  Subjective and objective measures of pulmonary function will be followed and the treatment plan will be adjusted accordingly.  Perennial allergic rhinitis  Continue appropriate aeroallergen avoidance measures, carbinoxamine 4 mg every 6-8 hours if needed, and azelastine nasal spray, 1-2 sprays per nostril 2 times daily as needed.   Nasal saline lavage (NeilMed) has been recommended as needed and prior to medicated nasal sprays along with instructions for proper administration.  If allergen avoidance measures and medications fail to adequately relieve symptoms, aeroallergen immunotherapy will be considered.  Acid reflux  Continue appropriate reflux lifestyle modifications, pantoprazole as prescribed, and famotidine (Pepcid) 20 mg twice daily.    Cough, persistent Improved.  Treatment plan as outlined above.   Meds ordered this encounter  Medications  . fluticasone (FLOVENT HFA) 110 MCG/ACT inhaler    Sig: Inhale 2 puffs into the lungs 2 (two) times daily.    Dispense:  1 Inhaler    Refill:  5    Diagnostics: None.   Physical examination: Not obtained as encounter was done via telephone.   The following portions of the patient's history were reviewed and updated as appropriate: allergies, current medications, past family history, past medical history, past social history, past surgical history and problem list.   Allergies as of 07/04/2018      Reactions   Fluticasone-salmeterol Anaphylaxis   Codeine Nausea  And Vomiting   Fetzima [levomilnacipran] Nausea And Vomiting   Nsaids Other (See Comments)   Upset stomach    Trulicity  [dulaglutide] Nausea And Vomiting   Significant and undesirably rapid (40 lbs) weight loss    Xanax [alprazolam] Other (See Comments)   disoriented   Amoxicillin Rash   Has patient had a PCN reaction causing immediate rash, facial/tongue/throat swelling, SOB or lightheadedness with hypotension: Yes Has patient had a PCN reaction causing severe rash involving mucus membranes or skin necrosis: No Has patient had a PCN reaction that required hospitalization: No Has patient had a PCN reaction occurring within the last 10 years: No If all of the above answers are "NO", then may proceed with Cephalosporin use.   Pseudoephedrine Hcl Er Palpitations      Medication List       Accurate as of July 04, 2018  3:40 PM. Always use your most recent med list.        Accu-Chek FastClix Lancets Misc USE TO CHECK BLOOD SUGAR 3 TIMES A DAY   Accu-Chek Guide test strip Generic drug:  glucose blood USE TO CHECK BLOOD SUGAR 3 TIMES PER DAY.   albuterol 108 (90 Base) MCG/ACT inhaler Commonly known as:  PROVENTIL HFA;VENTOLIN HFA Inhale 2 puffs into the lungs as needed for wheezing or shortness of breath.   amLODipine 5 MG tablet Commonly known as:  NORVASC Take 5 mg by mouth every morning.   aspirin EC 81 MG tablet Take 81 mg by mouth every evening.   atorvastatin 10 MG tablet Commonly known as:  LIPITOR Take 10 mg by mouth at bedtime.   azelastine 0.1 % nasal spray Commonly known as:  ASTELIN Place 2 sprays into both nostrils 2 (two) times daily. Use in each nostril as directed   beclomethasone 80 MCG/ACT inhaler Commonly known as:  QVAR Inhale 1 puff into the lungs daily as needed.   buPROPion 150 MG 24 hr tablet Commonly known as:  WELLBUTRIN XL Take 450 mg by mouth every morning.   CALCIUM-VITAMIN D PO Take 1 tablet by mouth daily.   carbidopa-levodopa 25-100 MG tablet Commonly known as:  SINEMET IR 1 TABLET THREE TIMES DAILY   Carbinoxamine Maleate 4 MG Tabs Take 1 tablet  (4 mg total) by mouth every 6 (six) hours as needed.   famotidine 20 MG tablet Commonly known as:  PEPCID TAKE 1 TABLET (20 MG TOTAL) BY MOUTH 2 (TWO) TIMES DAILY.   fluticasone 220 MCG/ACT inhaler Commonly known as:  Flovent HFA Inhale 2 puffs into the lungs 2 (two) times daily.   fluticasone 110 MCG/ACT inhaler Commonly known as:  Flovent HFA Inhale 2 puffs into the lungs 2 (two) times daily.   HumaLOG KwikPen 100 UNIT/ML KwikPen Generic drug:  insulin lispro   insulin glargine 100 UNIT/ML injection Commonly known as:  LANTUS Inject 10 Units into the skin every morning.   LORazepam 1 MG tablet Commonly known as:  ATIVAN Take 1 mg by mouth 4 (four) times daily.   LORazepam 2 MG tablet Commonly known as:  ATIVAN   losartan-hydrochlorothiazide 100-12.5 MG tablet Commonly known as:  HYZAAR Take 1 tablet by mouth daily.   montelukast 10 MG tablet Commonly known as:  SINGULAIR Take 1 tablet (10 mg total) by mouth at bedtime.   Neurontin 300 MG capsule Generic drug:  gabapentin Take 300 mg by mouth 2 (two) times daily.   pantoprazole 40 MG tablet Commonly known as:  PROTONIX Take 40 mg by  mouth 2 (two) times daily.   propranolol 40 MG tablet Commonly known as:  INDERAL Take 1 tablet (40 mg total) by mouth 2 (two) times daily.   rOPINIRole 2 MG tablet Commonly known as:  REQUIP Take 2 mg by mouth 2 (two) times daily.   Trintellix 20 MG Tabs tablet Generic drug:  vortioxetine HBr Take 20 mg by mouth daily.   valACYclovir 1000 MG tablet Commonly known as:  VALTREX Take 500 mg by mouth daily.   vitamin C 500 MG tablet Commonly known as:  ASCORBIC ACID Take 500 mg by mouth daily.   Vitamin D (Ergocalciferol) 50 MCG (2000 UT) Caps Take 1 capsule by mouth daily. 2000 units daily.   zolpidem 5 MG tablet Commonly known as:  AMBIEN Take 5 mg by mouth at bedtime.       Allergies  Allergen Reactions  . Fluticasone-Salmeterol Anaphylaxis  . Codeine Nausea  And Vomiting  . Fetzima [Levomilnacipran] Nausea And Vomiting  . Nsaids Other (See Comments)    Upset stomach   . Trulicity [Dulaglutide] Nausea And Vomiting    Significant and undesirably rapid (40 lbs) weight loss   . Xanax [Alprazolam] Other (See Comments)    disoriented  . Amoxicillin Rash    Has patient had a PCN reaction causing immediate rash, facial/tongue/throat swelling, SOB or lightheadedness with hypotension: Yes Has patient had a PCN reaction causing severe rash involving mucus membranes or skin necrosis: No Has patient had a PCN reaction that required hospitalization: No Has patient had a PCN reaction occurring within the last 10 years: No If all of the above answers are "NO", then may proceed with Cephalosporin use.   . Pseudoephedrine Hcl Er Palpitations   Review of systems: Review of systems negative except as noted in HPI / PMHx or noted below: Constitutional: Negative.  HENT: Negative.   Eyes: Negative.  Respiratory: Negative.   Cardiovascular: Negative.  Gastrointestinal: Negative.  Genitourinary: Negative.  Musculoskeletal: Negative.  Neurological: Negative.  Endo/Heme/Allergies: Negative.  Cutaneous: Negative.  Past Medical History:  Diagnosis Date  . Anxiety   . Arthritis    L HIP  . Asthma   . Avascular necrosis of hip (HCC)    LEFT  . Depression   . Diabetes mellitus   . Diabetes mellitus, type II ([place])   . Dry cough   . GERD (gastroesophageal reflux disease)   . Hypertension   . Insomnia    takes Ambien nightly  . Kidney disease   . Obesity   . Parkinsonism ([place]) 03/02/2017  . PONV (postoperative nausea and vomiting)   . RLS (restless legs syndrome) 01/15/2018  . TIA (transient ischemic attack) 2012   no residual problems  . Urgency incontinence     Family History  Problem Relation Age of Onset  . Alcohol abuse Brother   . Alcohol abuse Brother   . Alcohol abuse Brother   . Alcohol abuse Brother   . Allergic rhinitis Sister   .  Diabetes Neg Hx   . Angioedema Neg Hx   . Asthma Neg Hx   . Eczema Neg Hx   . Immunodeficiency Neg Hx   . Urticaria Neg Hx     Social History   Socioeconomic History  . Marital status: Married    Spouse name: Not on file  . Number of children: Not on file  . Years of education: Not on file  . Highest education level: Not on file  Occupational History  . Not on file  Social Needs  . Financial resource strain: Not on file  . Food insecurity:    Worry: Not on file    Inability: Not on file  . Transportation needs:    Medical: Not on file    Non-medical: Not on file  Tobacco Use  . Smoking status: Never Smoker  . Smokeless tobacco: Never Used  Substance and Sexual Activity  . Alcohol use: No    Alcohol/week: 0.0 standard drinks  . Drug use: No  . Sexual activity: Not Currently  Lifestyle  . Physical activity:    Days per week: Not on file    Minutes per session: Not on file  . Stress: Not on file  Relationships  . Social connections:    Talks on phone: Not on file    Gets together: Not on file    Attends religious service: Not on file    Active member of club or organization: Not on file    Attends meetings of clubs or organizations: Not on file    Relationship status: Not on file  . Intimate partner violence:    Fear of current or ex partner: Not on file    Emotionally abused: Not on file    Physically abused: Not on file    Forced sexual activity: Not on file  Other Topics Concern  . Not on file  Social History Narrative   Lives with husband   Caffeine use: none   Right handed     Previous notes and tests were reviewed.  I have discussed the assessment and treatment plan with the patient. The patient was provided an opportunity to ask questions and all were answered. The patient agreed with the plan and demonstrated an understanding of the instructions.   The patient was advised to call back or seek an in-person evaluation if the symptoms worsen or if the  condition fails to improve as anticipated.  I provided 27 minutes of non-face-to-face time during this encounter.  I appreciate the opportunity to take part in Addelyn's care. Please do not hesitate to contact me with questions.  Sincerely,   R. Edgar Frisk, MD

## 2018-07-04 NOTE — Telephone Encounter (Signed)
PT called back to say she was currently using the following inhalers:  ventolina hfa 90 mcg   flovent hfa 220 mcg

## 2018-07-04 NOTE — Telephone Encounter (Signed)
Pt. Is taking ventolin hfa and flovent 220 mcg.

## 2018-07-04 NOTE — Assessment & Plan Note (Signed)
   Continue appropriate aeroallergen avoidance measures, carbinoxamine 4 mg every 6-8 hours if needed, and azelastine nasal spray, 1-2 sprays per nostril 2 times daily as needed.   Nasal saline lavage (NeilMed) has been recommended as needed and prior to medicated nasal sprays along with instructions for proper administration.  If allergen avoidance measures and medications fail to adequately relieve symptoms, aeroallergen immunotherapy will be considered.

## 2018-07-04 NOTE — Assessment & Plan Note (Addendum)
Well-controlled, we will stepdown therapy at this time.  A prescription has been provided for Flovent 110  g, 2 inhalations via spacer device twice a day.  If lower respiratory symptoms progress in frequency and/or severity, the patient is to resume the previous dose of Flovent 220 g.  Continue montelukast 10 mg daily at bedtime and albuterol HFA, 1 to 2 inhalations every 4-6 hours if needed.  Subjective and objective measures of pulmonary function will be followed and the treatment plan will be adjusted accordingly.

## 2018-07-04 NOTE — Assessment & Plan Note (Signed)
Improved.  Treatment plan as outlined above.

## 2018-07-04 NOTE — Assessment & Plan Note (Addendum)
   Continue appropriate reflux lifestyle modifications, pantoprazole as prescribed, and famotidine (Pepcid) 20 mg twice daily.

## 2018-07-09 ENCOUNTER — Telehealth: Payer: Self-pay | Admitting: Neurology

## 2018-07-09 NOTE — Telephone Encounter (Signed)
Due to current COVID 19 pandemic, our office is severely reducing in office visits for at least the next 2 weeks, in order to minimize the risk to our patients and healthcare providers. Pt understands that although there may be some limitations with this type of visit, we will take all precautions to reduce any security or privacy concerns.  Pt understands that this will be treated like an in office visit and we will file with pt's insurance, and there may be a patient responsible charge related to this service. Pt's email is [email or web address]. Pt understands that the [employer] webex software must be downloaded and operational on the device pt plans to use for the visit.

## 2018-07-09 NOTE — Telephone Encounter (Signed)
I was able to complete precharting with pt over the phone. Pt states she has not tired to access her video visit link yet, but will look at it this afternoon and prepare for tomorrow video visit.

## 2018-07-09 NOTE — Telephone Encounter (Signed)
Webex video link has been provided and schedule on providers calender.

## 2018-07-09 NOTE — Addendum Note (Signed)
Addended by: Verlin Grills T on: 07/09/2018 03:15 PM   Modules accepted: Orders

## 2018-07-10 ENCOUNTER — Other Ambulatory Visit: Payer: Self-pay

## 2018-07-10 ENCOUNTER — Ambulatory Visit (INDEPENDENT_AMBULATORY_CARE_PROVIDER_SITE_OTHER): Payer: 59 | Admitting: Neurology

## 2018-07-10 ENCOUNTER — Encounter: Payer: Self-pay | Admitting: Neurology

## 2018-07-10 DIAGNOSIS — G2581 Restless legs syndrome: Secondary | ICD-10-CM

## 2018-07-10 DIAGNOSIS — G20 Parkinson's disease: Secondary | ICD-10-CM | POA: Diagnosis not present

## 2018-07-10 MED ORDER — BREXPIPRAZOLE 2 MG PO TABS: 2.0000 mg | ORAL_TABLET | Freq: Every day | ORAL | Status: DC

## 2018-07-10 MED FILL — ACCU-CHEK GUIDE STRP: 90 days supply | Qty: 300 | Fill #4

## 2018-07-10 MED FILL — AMLODIPINE BESYLATE 5 MG TA: 5 | 90 days supply | Qty: 90 | Fill #0

## 2018-07-10 MED FILL — CARBIDOPA/LEVO 25/100 TAB: 25-100 | 30 days supply | Qty: 90 | Fill #1

## 2018-07-10 MED FILL — MONTELUKAST SOD 10 MG TAB: 10 | 30 days supply | Qty: 30 | Fill #2

## 2018-07-10 NOTE — Progress Notes (Signed)
     Virtual Visit via Telephone Note  I connected with Barney Drain Bhavsar on 07/10/18 at 10:30 AM EDT by telephone and verified that I am speaking with the correct person using two identifiers.   I discussed the limitations, risks, security and privacy concerns of performing an evaluation and management service by telephone and the availability of in person appointments. I also discussed with the patient that there may be a patient responsible charge related to this service. The patient expressed understanding and agreed to proceed.   History of Present Illness: Ms. Pizzini is a 63 year old right-handed black female with a history of severe clinical depression, and prior history of antipsychotic use and ECT treatments.  The patient was on Abilify, she is now off the medication and is doing well.  The patient developed symptoms of Parkinson's disease, she was placed on Sinemet.  She has been on Requip 2 mg twice daily for quite a number of years because of restless leg syndrome.  She is not bothered by the restless leg syndrome at this point on the medication.  The patient was started on Sinemet within the last 6 months, she is now on the 25/100 mg tablet taking 1 full tablet 3 times daily.  She tolerates medication well.  She has good mobility, she has minimal tremor on the right greater than left upper extremity.  She denies any significant issues with getting up out of a chair or walking across the room, there has been no freezing episodes.  She reports no troubles with swallowing or choking or drooling.  She denies vivid dreams at night.  She does have some mild memory problems that predates the diagnosis of Parkinson's disease associated with the prior ECT treatments.  The patient is pleased with her mobility.   Observations/Objective: Telephone evaluation reveals that the patient has normal speech pattern, no hypophonia, dysarthria, or aphasia.  She appears to be alert and oriented and  responds appropriately to questions.  Assessment and Plan: 1.  Parkinson's disease  2.  Clinical depression  3.  Restless leg syndrome  The patient appears to be doing quite well.  She is off of Abilify, she has been changed to [place].  The patient is tolerating the Requip and Sinemet dosing.  She will continue on the current dose of Sinemet, I will see her back in the office in about 4 months.  Follow Up Instructions: Follow-up with me in 4 months.   I discussed the assessment and treatment plan with the patient. The patient was provided an opportunity to ask questions and all were answered. The patient agreed with the plan and demonstrated an understanding of the instructions.   The patient was advised to call back or seek an in-person evaluation if the symptoms worsen or if the condition fails to improve as anticipated.  I provided 15 minutes of non-face-to-face time during this encounter.   Kathrynn Ducking, MD

## 2018-07-12 MED FILL — SM ALCOHOL 70% PREP PADS: 70 | 20 days supply | Qty: 100 | Fill #2

## 2018-07-18 MED FILL — LANTUS SOLOSTAR 100 UNITS/M: 100 | 30 days supply | Qty: 15 | Fill #0

## 2018-07-18 MED FILL — TECHLITE PEN NDL 32GX1/4": 32G X 6 MM | 90 days supply | Qty: 400 | Fill #0

## 2018-07-18 MED FILL — TECHLITE PEN NDL 32GX1/4: 32G X 6 MM | 90 days supply | Qty: 400 | Fill #0

## 2018-07-20 ENCOUNTER — Ambulatory Visit: Payer: 59 | Admitting: Neurology

## 2018-07-23 MED FILL — HYDROCHLOROTHIAZIDE 12.5 MG: 12.5 | 90 days supply | Qty: 90 | Fill #0

## 2018-07-23 MED FILL — ACCU-CHEK FASTCLIX LANCETS: 34 days supply | Qty: 102 | Fill #4

## 2018-07-23 MED FILL — LOSARTAN POTASSIUM 100 MG T: 100 | 90 days supply | Qty: 90 | Fill #0

## 2018-08-02 MED FILL — ATORVASTATIN 10 MG TABLET: 10 | 90 days supply | Qty: 90 | Fill #3

## 2018-08-10 MED FILL — HUMALOG 100 UNITS/ML KWIKPE: 100 | 72 days supply | Qty: 15 | Fill #1

## 2018-08-15 MED FILL — EASY TOUCH ALCOHOL 70% PADS: 70 | 20 days supply | Qty: 100 | Fill #3

## 2018-08-15 MED FILL — ZOLPIDEM TARTRATE 5 MG TAB: 5 | 90 days supply | Qty: 90 | Fill #1

## 2018-08-15 MED FILL — MONTELUKAST SOD 10 MG TAB: 10 | 30 days supply | Qty: 30 | Fill #3

## 2018-08-15 MED FILL — CARBIDOPA/LEVO 25/100 TAB: 25-100 | 30 days supply | Qty: 90 | Fill #2

## 2018-08-15 MED FILL — CARBINOXAMINE MALEATE 4 MG: 4 | 20 days supply | Qty: 60 | Fill #2

## 2018-08-20 MED FILL — MYRBETRIQ ER 50 MG TABLET: 50 | 30 days supply | Qty: 30 | Fill #1

## 2018-08-27 MED FILL — ACCU-CHEK FASTCLIX LANCETS: 34 days supply | Qty: 102 | Fill #5

## 2018-08-27 MED FILL — PROPRANOLOL 40 MG TABLET: 40 | 90 days supply | Qty: 180 | Fill #3

## 2018-08-27 MED FILL — PANTOPRAZOLE SOD DR 40 MG T: 40 | 45 days supply | Qty: 90 | Fill #1

## 2018-08-27 MED FILL — rOPINIRole HCL 2 MG TABS: 2 | 90 days supply | Qty: 180 | Fill #1

## 2018-08-28 MED FILL — GABAPENTIN 300 MG CAPSULE: 300 | 90 days supply | Qty: 180 | Fill #0

## 2018-08-30 DIAGNOSIS — M5416 Radiculopathy, lumbar region: Secondary | ICD-10-CM | POA: Diagnosis not present

## 2018-09-04 DIAGNOSIS — N2581 Secondary hyperparathyroidism of renal origin: Secondary | ICD-10-CM | POA: Diagnosis not present

## 2018-09-04 DIAGNOSIS — D631 Anemia in chronic kidney disease: Secondary | ICD-10-CM | POA: Diagnosis not present

## 2018-09-04 DIAGNOSIS — I129 Hypertensive chronic kidney disease with stage 1 through stage 4 chronic kidney disease, or unspecified chronic kidney disease: Secondary | ICD-10-CM | POA: Diagnosis not present

## 2018-09-04 DIAGNOSIS — N183 Chronic kidney disease, stage 3 (moderate): Secondary | ICD-10-CM | POA: Diagnosis not present

## 2018-09-04 DIAGNOSIS — R609 Edema, unspecified: Secondary | ICD-10-CM | POA: Diagnosis not present

## 2018-09-04 MED FILL — FUROSEMIDE 20 MG TAB: 20 | 30 days supply | Qty: 30 | Fill #0

## 2018-09-05 MED FILL — LORAZEPAM 2 MG TABS: 2 | 90 days supply | Qty: 270 | Fill #1

## 2018-09-07 DIAGNOSIS — E119 Type 2 diabetes mellitus without complications: Secondary | ICD-10-CM | POA: Diagnosis not present

## 2018-09-07 DIAGNOSIS — E042 Nontoxic multinodular goiter: Secondary | ICD-10-CM | POA: Diagnosis not present

## 2018-09-07 DIAGNOSIS — N183 Chronic kidney disease, stage 3 (moderate): Secondary | ICD-10-CM | POA: Diagnosis not present

## 2018-09-07 DIAGNOSIS — I700 Atherosclerosis of aorta: Secondary | ICD-10-CM | POA: Diagnosis not present

## 2018-09-10 ENCOUNTER — Ambulatory Visit
Admission: RE | Admit: 2018-09-10 | Discharge: 2018-09-10 | Disposition: A | Discharge status: Home / Self Care | Payer: 59 | Source: Ambulatory Visit | Attending: Nephrology | Admitting: Nephrology

## 2018-09-10 ENCOUNTER — Other Ambulatory Visit: Payer: Self-pay | Admitting: Nephrology

## 2018-09-10 DIAGNOSIS — R079 Chest pain, unspecified: Secondary | ICD-10-CM | POA: Diagnosis not present

## 2018-09-10 DIAGNOSIS — I129 Hypertensive chronic kidney disease with stage 1 through stage 4 chronic kidney disease, or unspecified chronic kidney disease: Secondary | ICD-10-CM | POA: Diagnosis not present

## 2018-09-10 DIAGNOSIS — D631 Anemia in chronic kidney disease: Secondary | ICD-10-CM | POA: Diagnosis not present

## 2018-09-10 DIAGNOSIS — N183 Chronic kidney disease, stage 3 (moderate): Secondary | ICD-10-CM | POA: Diagnosis not present

## 2018-09-12 ENCOUNTER — Telehealth: Payer: Self-pay | Admitting: Interventional Cardiology

## 2018-09-12 NOTE — Telephone Encounter (Signed)
Pt called to say that Dr. Posey Pronto her Kidney doctor has been treating her for foot and ankle edema, shortness of breath with walking 1/2 block maximum, and having to elevate her head at night to sleep. He started her on Lasix 20mg  last Tuesday which has improved her sleeping but still has the edema so he increased it to 40mg  yesterday but no K supplement since bloodwork she had done yesterday was normal. He has asked that she follow up with cardiology. She denies any dietary changes that could contribute to the fluid. I have asked her to elevate her feet when sitting try to cut back on the amount of Na in her diet and I made her an appt with B. Simmons tomorrow morning. Pt agrees to have her VS, weight, and meds ready and I will call and get the labs faxed over to our office and put into Epic.      Virtual Visit Pre-Appointment Phone Call  "(Name), I am calling you today to discuss your upcoming appointment. We are currently trying to limit exposure to the virus that causes COVID-19 by seeing patients at home rather than in the office."  1. "What is the BEST phone number to call the day of the visit?" - include this in appointment notes  2. "Do you have or have access to (through a family member/friend) a smartphone with video capability that we can use for your visit?" a. If yes - list this number in appt notes as "cell" (if different from BEST phone #) and list the appointment type as a VIDEO visit in appointment notes b. If no - list the appointment type as a PHONE visit in appointment notes  3. Confirm consent - "In the setting of the current Covid19 crisis, you are scheduled for a (phone or video) visit with your provider on (date) at (time).  Just as we do with many in-office visits, in order for you to participate in this visit, we must obtain consent.  If you'd like, I can send this to your mychart (if signed up) or email for you to review.  Otherwise, I can obtain your verbal consent now.  All  virtual visits are billed to your insurance company just like a normal visit would be.  By agreeing to a virtual visit, we'd like you to understand that the technology does not allow for your provider to perform an examination, and thus may limit your provider's ability to fully assess your condition. If your provider identifies any concerns that need to be evaluated in person, we will make arrangements to do so.  Finally, though the technology is pretty good, we cannot assure that it will always work on either your or our end, and in the setting of a video visit, we may have to convert it to a phone-only visit.  In either situation, we cannot ensure that we have a secure connection.  Are you willing to proceed?" STAFF: Did the patient verbally acknowledge consent to telehealth visit? Document YES/NO here: YES  4. Advise patient to be prepared - "Two hours prior to your appointment, go ahead and check your blood pressure, pulse, oxygen saturation, and your weight (if you have the equipment to check those) and write them all down. When your visit starts, your provider will ask you for this information. If you have an Apple Watch or Kardia device, please plan to have heart rate information ready on the day of your appointment. Please have a pen and paper handy  nearby the day of the visit as well."  5. Give patient instructions for MyChart download to smartphone OR Doximity/Doxy.me as below if video visit (depending on what platform provider is using)  6. Inform patient they will receive a phone call 15 minutes prior to their appointment time (may be from unknown caller ID) so they should be prepared to answer    TELEPHONE CALL NOTE  Deanna Rowe has been deemed a candidate for a follow-up tele-health visit to limit community exposure during the Covid-19 pandemic. I spoke with the patient via phone to ensure availability of phone/video source, confirm preferred email & phone number, and discuss  instructions and expectations.  I reminded Deanna Rowe to be prepared with any vital sign and/or heart rhythm information that could potentially be obtained via home monitoring, at the time of her visit. I reminded Deanna Rowe to expect a phone call prior to her visit.  Stephani Police, RN 09/12/2018 1:50 PM   INSTRUCTIONS FOR DOWNLOADING THE MYCHART APP TO SMARTPHONE  - The patient must first make sure to have activated MyChart and know their login information - If Apple, go to [employer] and type in MyChart in the search bar and download the app. If Android, ask patient to go to [employer] and type in [place] in the search bar and download the app. The app is free but as with any other app downloads, their phone may require them to verify saved payment information or Apple/Android password.  - The patient will need to then log into the app with their MyChart username and password, and select  as their healthcare provider to link the account. When it is time for your visit, go to the MyChart app, find appointments, and click Begin Video Visit. Be sure to Select Allow for your device to access the Microphone and Camera for your visit. You will then be connected, and your provider will be with you shortly.  **If they have any issues connecting, or need assistance please contact MyChart service desk [phone])**  **If using a computer, in order to ensure the best quality for their visit they will need to use either of the following Internet Browsers: [employer], or Google Chrome**  IF USING DOXIMITY or DOXY.ME - The patient will receive a link just prior to their visit by text.     FULL LENGTH CONSENT FOR TELE-HEALTH VISIT   I hereby voluntarily request, consent and authorize [facility] and its employed or contracted physicians, physician assistants, nurse practitioners or other licensed health care professionals (the Practitioner),  to provide me with telemedicine health care services (the "Services") as deemed necessary by the treating Practitioner. I acknowledge and consent to receive the Services by the Practitioner via telemedicine. I understand that the telemedicine visit will involve communicating with the Practitioner through live audiovisual communication technology and the disclosure of certain medical information by electronic transmission. I acknowledge that I have been given the opportunity to request an in-person assessment or other available alternative prior to the telemedicine visit and am voluntarily participating in the telemedicine visit.  I understand that I have the right to withhold or withdraw my consent to the use of telemedicine in the course of my care at any time, without affecting my right to future care or treatment, and that the Practitioner or I may terminate the telemedicine visit at any time. I understand that I have the right to inspect all information obtained and/or recorded  in the course of the telemedicine visit and may receive copies of available information for a reasonable fee.  I understand that some of the potential risks of receiving the Services via telemedicine include:  Marland Kitchen Delay or interruption in medical evaluation due to technological equipment failure or disruption; . Information transmitted may not be sufficient (e.g. poor resolution of images) to allow for appropriate medical decision making by the Practitioner; and/or  . In rare instances, security protocols could fail, causing a breach of personal health information.  Furthermore, I acknowledge that it is my responsibility to provide information about my medical history, conditions and care that is complete and accurate to the best of my ability. I acknowledge that Practitioner's advice, recommendations, and/or decision may be based on factors not within their control, such as incomplete or inaccurate data provided by me or distortions  of diagnostic images or specimens that may result from electronic transmissions. I understand that the practice of medicine is not an exact science and that Practitioner makes no warranties or guarantees regarding treatment outcomes. I acknowledge that I will receive a copy of this consent concurrently upon execution via email to the email address I last provided but may also request a printed copy by calling the office of [facility].    I understand that my insurance will be billed for this visit.   I have read or had this consent read to me. . I understand the contents of this consent, which adequately explains the benefits and risks of the Services being provided via telemedicine.  . I have been provided ample opportunity to ask questions regarding this consent and the Services and have had my questions answered to my satisfaction. . I give my informed consent for the services to be provided through the use of telemedicine in my medical care  By participating in this telemedicine visit I agree to the above.

## 2018-09-12 NOTE — Telephone Encounter (Signed)
Spoke with Harriett Rush with Dr. [facility] and she will fax her last few notes and labs for the pts appt 09/13/18 and will have scanned into Epic for [employer]

## 2018-09-12 NOTE — Telephone Encounter (Signed)
New Message     Pt c/o swelling: STAT is pt has developed SOB within 24 hours  1) How much weight have you gained and in what time span? About 10 lbs   2) If swelling, where is the swelling located? Feet and legs   3) Are you currently taking a fluid pill? Lasix   4) Are you currently SOB? Yes, started last Friday   5) Do you have a log of your daily weights (if so, list)? Yes   6) Have you gained 3 pounds in a day or 5 pounds in a week? 3 pounds every week for the last 3 weeks   7) Have you traveled recently? No   Pt wants to get an appointment she said she saw her Kidney doctor and they recommended she get an appt with [name]   Please call

## 2018-09-13 ENCOUNTER — Encounter: Payer: Self-pay | Admitting: Cardiology

## 2018-09-13 ENCOUNTER — Telehealth (INDEPENDENT_AMBULATORY_CARE_PROVIDER_SITE_OTHER): Payer: 59 | Admitting: Cardiology

## 2018-09-13 ENCOUNTER — Other Ambulatory Visit: Payer: Self-pay

## 2018-09-13 VITALS — BP 151/90 | HR 73 | Ht 66.0 in | Wt 241.1 lb

## 2018-09-13 DIAGNOSIS — R600 Localized edema: Secondary | ICD-10-CM

## 2018-09-13 DIAGNOSIS — R0600 Dyspnea, unspecified: Secondary | ICD-10-CM | POA: Diagnosis not present

## 2018-09-13 MED FILL — LANTUS SOLOSTAR 100 UNITS/M: 100 | 30 days supply | Qty: 15 | Fill #1

## 2018-09-13 NOTE — Progress Notes (Signed)
Virtual Visit via Video Note   This visit type was conducted due to national recommendations for restrictions regarding the COVID-19 Pandemic (e.g. social distancing) in an effort to limit this patient's exposure and mitigate transmission in our community.  Due to her co-morbid illnesses, this patient is at least at moderate risk for complications without adequate follow up.  This format is felt to be most appropriate for this patient at this time.  All issues noted in this document were discussed and addressed.  A limited physical exam was performed with this format.  Please refer to the patient's chart for her consent to telehealth for [facility].   Date:  09/13/2018   ID:  Deanna Rowe, DOB Dec 24, 1955, MRN [id number]  Patient Location: Home Provider Location: Home  PCP:  Leighton Ruff, MD  Cardiologist:  Dr. Irish Lack Electrophysiologist:  None   Evaluation Performed:  Follow-Up Visit  Chief Complaint:  Lower Extremity Edema and Dyspnea   History of Present Illness:    Deanna P Porteeis a 63 y.o.femalewho is being seen today for new lower extremity edema and dyspnea.  She has a history of symptomatic PVCs and PACs.   To summarize her history, she has a history of palpitations and tachycardia. In 2015, she was seen by Dr. Irish Lack for dzziness and palpitations.Dr. Irish Lack ordered an event monitor as well as an echocardiogram to evaluate for structural heart disease. Her echo revealed normal left ventricular systolic function, with an EF of 55-60% as well as normal valvular function. Wall motion was asynchronous consistent with intraventricular conduction delay (IVCD orbundle branch block); there were no regional wall motion abnormalities. Doppler parameters were consistent with abnormal left ventricular relaxation (grade 1 diastolic dysfunction). Heart monitor showed sinus rhythm with no arrhythmias. Labs were also obtained during that time. CBC was negative for  anemia. Hemoglobin 12.5.  She presented back on 04/16/15, with a report of recent w/ extreme tachycardia w/ pulse rates as high as 200s with exercise and also noted palpitations, exertional dyspnea and chest pain.I orderedan exercise tolerance to rule out ischemia and exertional arrhythmias as well as a 48-hour Holter monitor. Stress test was negative for ischemia. 48-hour Holter monitor showed normal sinus rhythm with PVCs and PACs. I started her on beta-blocker therapy with low-dose metoprolol. She was seen several weeks in follow-up and reported symptomatic improvement with metoprolol.   She now reports new lower extremity edema and dyspnea.  Edema has been present for about 2 weeks now.  She also noted a 10 pound weight gain over the course of 3 weeks as well as new exertional dyspnea and orthopnea.  She has had difficulties grocery shopping, having to stop and rest.  Has also had to increase the number of pillows that she sleeps on due to orthopnea.  She has also noted some increased abdominal girth and edema.  She denies any other changes to her health.  No recent chest pain or palpitations.  She denies high intake of high sodium foods.  No fevers, chills or cough.  No known exposures to COVID-19.  She initially went to see her nephrologist.  She saw Dr. Serita Grit PA.  She is followed by nephrology for stage III CKD.  I have reviewed note from nephrologist office.  They obtained a chest x-ray, CBC, comprehensive metabolic panel, TSH and UA.  Chest x-ray did not show any edema.  All other labs were normal.  A BNP lab was not obtained.  Patient however was started on low-dose furosemide,  20 mg daily.  Patient had follow-up at nephrologist office and noted some improvement in edema however it was still present.  Renal function and potassium were both stable thus furosemide was further increased to 40 mg daily.  Patient referred to cardiology for further evaluation.  She reports that since increasing  Lasix to 40 mg daily she has noticed significant improvement.  She has lost about 8 pounds.  Her edema has nearly resolved.  Based on what I can visualize from video visit she appears to have trace edema currently.  Her breathing has improved.  No resting dyspnea.  The patient does not have symptoms concerning for COVID-19 infection (fever, chills, cough, or new shortness of breath).    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[place])    LEFT   Depression    Diabetes mellitus    Diabetes mellitus, type II (HCC)    Dry cough    GERD (gastroesophageal reflux disease)    Hypertension    Insomnia    takes Ambien nightly   Kidney disease    Obesity    Parkinsonism ([place]) 03/02/2017   PONV (postoperative nausea and vomiting)    RLS (restless legs syndrome) 01/15/2018   TIA (transient ischemic attack) 2012   no residual problems   Urgency incontinence    Past Surgical History:  Procedure Laterality Date   DILATION AND CURETTAGE OF UTERUS     ESOPHAGEAL MANOMETRY N/A 09/03/2012   Procedure: ESOPHAGEAL MANOMETRY (EM);  Surgeon: Garlan Fair, MD;  Location: [facility];  Service: Endoscopy;  Laterality: N/A;   ESOPHAGEAL MANOMETRY N/A 06/19/2017   Procedure: ESOPHAGEAL MANOMETRY (EM);  Surgeon: Clarene Essex, MD;  Location: [facility];  Service: Endoscopy;  Laterality: N/A;   EXPLORATORY LAPAROTOMY     JOINT REPLACEMENT  2011   rt total hip   PILONIDAL CYST / SINUS EXCISION     TOTAL HIP ARTHROPLASTY Left 10/09/2013   Procedure: LEFT TOTAL HIP ARTHROPLASTY ANTERIOR APPROACH;  Surgeon: [name], MD;  Location: [facility];  Service: Orthopedics;  Laterality: Left;     Current Meds  Medication Sig   ACCU-CHEK FASTCLIX LANCETS MISC USE TO CHECK BLOOD SUGAR 3 TIMES A DAY   ACCU-CHEK GUIDE test strip USE TO CHECK BLOOD SUGAR 3 TIMES PER DAY.   albuterol (PROVENTIL HFA;VENTOLIN HFA) 108 (90  Base) MCG/ACT inhaler Inhale 2 puffs into the lungs as needed for wheezing or shortness of breath.   amLODipine (NORVASC) 5 MG tablet Take 5 mg by mouth every morning.    aspirin EC 81 MG tablet Take 81 mg by mouth every evening.   atorvastatin (LIPITOR) 10 MG tablet Take 10 mg by mouth at bedtime.   azelastine (ASTELIN) 0.1 % nasal spray Place 2 sprays into both nostrils 2 (two) times daily. Use in each nostril as directed   beclomethasone (QVAR) 80 MCG/ACT inhaler Inhale 1 puff into the lungs daily as needed.   Brexpiprazole (REXULTI) 2 MG TABS Take 2 mg by mouth daily.   buPROPion (WELLBUTRIN XL) 150 MG 24 hr tablet Take 450 mg by mouth every morning.    carbidopa-levodopa (SINEMET IR) 25-100 MG tablet 1 TABLET THREE TIMES DAILY   Carbinoxamine Maleate 4 MG TABS Take 1 tablet (4 mg total) by mouth every 6 (six) hours as needed.   famotidine (PEPCID) 20 MG tablet Take 20 mg by  mouth daily.   fluticasone (FLOVENT HFA) 110 MCG/ACT inhaler Inhale 2 puffs into the lungs 2 (two) times daily.   furosemide (LASIX) 40 MG tablet Take 40 mg by mouth daily.   gabapentin (NEURONTIN) 300 MG capsule Take 300 mg by mouth 2 (two) times daily.    HUMALOG KWIKPEN 100 UNIT/ML KwikPen    hydrochlorothiazide (HYDRODIURIL) 12.5 MG tablet Take 12.5 mg by mouth daily.   insulin glargine (LANTUS) 100 UNIT/ML injection Inject 10 Units into the skin every morning.   LORazepam (ATIVAN) 1 MG tablet Take 2 mg by mouth 3 (three) times daily.    losartan (COZAAR) 100 MG tablet Take 100 mg by mouth daily.   mirabegron ER (MYRBETRIQ) 50 MG TB24 tablet Take 50 mg by mouth daily.   montelukast (SINGULAIR) 10 MG tablet Take 1 tablet (10 mg total) by mouth at bedtime.   pantoprazole (PROTONIX) 40 MG tablet Take 40 mg by mouth 2 (two) times daily.    propranolol (INDERAL) 40 MG tablet Take 1 tablet (40 mg total) by mouth 2 (two) times daily.   rOPINIRole (REQUIP) 2 MG tablet Take 2 mg by mouth 2 (two) times  daily.   valACYclovir (VALTREX) 1000 MG tablet Take 500 mg by mouth daily.    vitamin C (ASCORBIC ACID) 500 MG tablet Take 500 mg by mouth daily.   Vitamin D, Ergocalciferol, 2000 units CAPS Take 1 capsule by mouth daily. 2000 units daily.   vortioxetine HBr (TRINTELLIX) 20 MG TABS Take 10 mg by mouth daily.    zolpidem (AMBIEN) 5 MG tablet Take 5 mg by mouth at bedtime.    [DISCONTINUED] famotidine (PEPCID) 20 MG tablet TAKE 1 TABLET (20 MG TOTAL) BY MOUTH 2 (TWO) TIMES DAILY. (Patient taking differently: Take 20 mg by mouth daily. )     Allergies:   Fluticasone-salmeterol, Codeine, Fetzima [levomilnacipran], Nsaids, Trulicity [dulaglutide], Xanax [alprazolam], Amoxicillin, and Pseudoephedrine hcl er   Social History   Tobacco Use   Smoking status: Never Smoker   Smokeless tobacco: Never Used  Substance Use Topics   Alcohol use: No    Alcohol/week: 0.0 standard drinks   Drug use: No     Family Hx: The patient's family history includes Alcohol abuse in her brother, brother, brother, and brother; Allergic rhinitis in her sister. There is no history of Diabetes, Angioedema, Asthma, Eczema, Immunodeficiency, or Urticaria.  ROS:   Please see the history of present illness.     All other systems reviewed and are negative.   Prior CV studies:   The following studies were reviewed today:  2D Echo 2011Study Conclusions   - Left ventricle: The cavity size was normal. Systolic function was   normal. The estimated ejection fraction was in the range of 60% to   65%. Regional wall motion abnormalities cannot be excluded.   Doppler parameters are consistent with abnormal left ventricular   relaxation (grade 1 diastolic dysfunction).  - Left atrium: The atrium was mildly dilated.  - Atrial septum: There was an atrial septal aneurysm.  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 Given the patient's history of  recurrent TIAs, consider   repeating the study with a bubble study or TEE to further   evlauate, if clinically indicated.     Labs/Other Tests and Data Reviewed:    EKG:  An ECG dated 2019 was personally reviewed today and demonstrated:  NSR 83 bpm  Recent Labs: 03/18/2018:  ALT 7; BUN 19; Creatinine, Ser 1.42; Hemoglobin 12.0; Platelets 283; Potassium 4.0; Sodium 138   Recent Lipid Panel No results found for: CHOL, TRIG, HDL, CHOLHDL, LDLCALC, LDLDIRECT  Wt Readings from Last 3 Encounters:  09/13/18 241 lb 1.6 oz (109.4 kg)  03/18/18 229 lb (103.9 kg)  02/08/18 227 lb 3.2 oz (103.1 kg)     Objective:    Vital Signs:  BP (!) 151/90    Pulse 73    Ht 5\' 6"  (1.676 m)    Wt 241 lb 1.6 oz (109.4 kg)    BMI 38.91 kg/m    VITAL SIGNS:  reviewed GEN:  no acute distress EYES:  sclerae anicteric, EOMI - Extraocular Movements Intact RESPIRATORY:  normal respiratory effort, symmetric expansion CARDIOVASCULAR:  trace bilateral LEE SKIN:  no rash, lesions or ulcers. MUSCULOSKELETAL:  no obvious deformities. NEURO:  alert and oriented x 3, no obvious focal deficit PSYCH:  normal affect  ASSESSMENT & PLAN:    1. LEE and Dyspnea: Based on recent history with lower extremity edema, increased abdominal girth/edema, exertional dyspnea and orthopnea, suspect CHF.  We will check BNP and 2D echocardiogram.  Her nephrologist has already started furosemide which has resulted in symptomatic improvement.  We will continue current dose.  We will check basic metabolic panel along with BNP to ensure that renal function and potassium levels are still within normal range.  Will determine further work-up/medical management based on echocardiogram.  If patient found to have new systolic heart failure, we will plan for a 48-hour Holter monitor to rule out high burden PVCs given her history of PVCs in the past as well as ischemic work-up to rule out underlying coronary artery disease with either nuclear  stress test or coronary CTA.  Luckily she has not endorsed any chest pain symptoms.  COVID-19 Education: The signs and symptoms of COVID-19 were discussed with the patient and how to seek care for testing (follow up with PCP or arrange E-visit).  The importance of social distancing was discussed today.  Time:   Today, I have spent 25 minutes with the patient with telehealth technology discussing the above problems.     Medication Adjustments/Labs and Tests Ordered: Current medicines are reviewed at length with the patient today.  Concerns regarding medicines are outlined above.   Tests Ordered: Orders Placed This Encounter  Procedures   Pro b natriuretic peptide (BNP)   Basic metabolic panel   ECHOCARDIOGRAM COMPLETE    Medication Changes: No orders of the defined types were placed in this encounter.   Disposition:  Follow up after echo in 1-2 weeks   Signed, Nelida Gores  09/13/2018 10:39 AM    [place]

## 2018-09-13 NOTE — Patient Instructions (Signed)
Medication Instructions:  none If you need a refill on your cardiac medications before your next appointment, please call your pharmacy.   Lab work:Next Week Same Day As Echo BNP BMP If you have labs (blood work) drawn today and your tests are completely normal, you will receive your results only by: Marland Kitchen MyChart Message (if you have MyChart) OR . A paper copy in the mail If you have any lab test that is abnormal or we need to change your treatment, we will call you to review the results.  Testing/Procedures:Someone will call to schedule Your physician has requested that you have an echocardiogram. Echocardiography is a painless test that uses sound waves to create images of your heart. It provides your doctor with information about the size and shape of your heart and how well your heart's chambers and valves are working. This procedure takes approximately one hour. There are no restrictions for this procedure.    Follow-Up: To be determined At [facility], you and your health needs are our priority.  As part of our continuing mission to provide you with exceptional heart care, we have created designated Provider Care Teams.  These Care Teams include your primary Cardiologist (physician) and Advanced Practice Providers (APPs -  Physician Assistants and Nurse Practitioners) who all work together to provide you with the care you need, when you need it. .  Any Other Special Instructions Will Be Listed Below (If Applicable).

## 2018-09-14 MED FILL — CARBIDOPA/LEVO 25/100 TAB: 25-100 | 30 days supply | Qty: 90 | Fill #3

## 2018-09-14 MED FILL — MYRBETRIQ ER 50 MG TABLET: 50 | 30 days supply | Qty: 30 | Fill #2

## 2018-09-14 MED FILL — MONTELUKAST SOD 10 MG TAB: 10 | 30 days supply | Qty: 30 | Fill #4

## 2018-09-14 MED FILL — buPROPion HCL ER (XL) 150 M: 150 | 90 days supply | Qty: 270 | Fill #1

## 2018-09-18 DIAGNOSIS — M5136 Other intervertebral disc degeneration, lumbar region: Secondary | ICD-10-CM | POA: Diagnosis not present

## 2018-09-18 MED FILL — REXULTI 2 MG TABLET: 2 | 90 days supply | Qty: 90 | Fill #0

## 2018-09-19 ENCOUNTER — Other Ambulatory Visit: Payer: 59 | Admitting: *Deleted

## 2018-09-19 ENCOUNTER — Other Ambulatory Visit: Payer: Self-pay

## 2018-09-19 DIAGNOSIS — R0600 Dyspnea, unspecified: Secondary | ICD-10-CM

## 2018-09-19 DIAGNOSIS — R600 Localized edema: Secondary | ICD-10-CM | POA: Diagnosis not present

## 2018-09-19 LAB — BASIC METABOLIC PANEL
BUN/Creatinine Ratio: 15 (ref 12–28)
BUN: 22 mg/dL (ref 8–27)
CO2: 22 mmol/L (ref 20–29)
Calcium: 9.2 mg/dL (ref 8.7–10.3)
Chloride: 94 mmol/L — ABNORMAL LOW (ref 96–106)
Creatinine, Ser: 1.5 mg/dL — ABNORMAL HIGH (ref 0.57–1.00)
GFR calc Af Amer: 42 mL/min/{1.73_m2} — ABNORMAL LOW (ref >59)
GFR calc non Af Amer: 37 mL/min/{1.73_m2} — ABNORMAL LOW (ref >59)
Glucose: 287 mg/dL — ABNORMAL HIGH (ref 65–99)
Potassium: 4.6 mmol/L (ref 3.5–5.2)
Sodium: 132 mmol/L — ABNORMAL LOW (ref 134–144)

## 2018-09-19 LAB — PRO B NATRIURETIC PEPTIDE: NT-Pro BNP: 7 pg/mL (ref 0–287)

## 2018-09-20 ENCOUNTER — Telehealth: Payer: Self-pay | Admitting: Oncology

## 2018-09-20 NOTE — Telephone Encounter (Signed)
Returned call to patient re moving 7/17 f/u to 7/10 and 7/10 lab to 7/3. Office is closed 7/3 and provider not in office 7/10. Appointments remain as scheduled. Left message for patient. Patient asked to call back if she still wishes to reschedule for different days.

## 2018-09-21 ENCOUNTER — Telehealth: Payer: Self-pay | Admitting: *Deleted

## 2018-09-21 MED ORDER — FUROSEMIDE 40 MG PO TABS: 40.0000 mg | ORAL_TABLET | Freq: Every day | ORAL | 6 refills | Status: DC

## 2018-09-21 MED FILL — HUMALOG 100 UNITS/ML KWIKPE: 100 | 36 days supply | Qty: 15 | Fill #0

## 2018-09-21 MED FILL — FUROSEMIDE 40 MG TAB: 40 | 30 days supply | Qty: 30 | Fill #0

## 2018-09-21 NOTE — Telephone Encounter (Signed)
Patient left a msg on the refill vm requesting a call back to discuss the furosemide 40 mg. She says that this was ordered by a Amada Kingfisher and she is wanting to know if she should still be taking this. Per notes this was started by Dr Posey Pronto at [facility]. Patient can be reached at [phone]. Thanks, MI

## 2018-09-21 NOTE — Telephone Encounter (Signed)
Called and spoke to patient. Made her aware of her BMET and BNP results and recommendations. Refill sent in for her lasix. She states that she could not reach Dr. Serita Grit office.   Notes recorded by Consuelo Pandy, PA-C on 09/20/2018 at 1:24 PM EDT  Fluid marker test is actually normal. Continue lasix at current dose, as previously prescribed by nephrology. Renal function and K ok after starting lasix. Keep plans to get heart ultrasound on 6/24.

## 2018-09-24 ENCOUNTER — Telehealth: Payer: Self-pay | Admitting: Neurology

## 2018-09-24 DIAGNOSIS — N189 Chronic kidney disease, unspecified: Secondary | ICD-10-CM | POA: Diagnosis not present

## 2018-09-24 DIAGNOSIS — D631 Anemia in chronic kidney disease: Secondary | ICD-10-CM | POA: Diagnosis not present

## 2018-09-24 DIAGNOSIS — N183 Chronic kidney disease, stage 3 (moderate): Secondary | ICD-10-CM | POA: Diagnosis not present

## 2018-09-24 DIAGNOSIS — N2581 Secondary hyperparathyroidism of renal origin: Secondary | ICD-10-CM | POA: Diagnosis not present

## 2018-09-24 DIAGNOSIS — I129 Hypertensive chronic kidney disease with stage 1 through stage 4 chronic kidney disease, or unspecified chronic kidney disease: Secondary | ICD-10-CM | POA: Diagnosis not present

## 2018-09-24 MED ORDER — CARBIDOPA-LEVODOPA 25-100 MG PO TABS: ORAL_TABLET | ORAL | 3 refills | Status: DC

## 2018-09-24 NOTE — Addendum Note (Signed)
Addended by: Kathrynn Ducking on: 09/24/2018 10:59 AM   Modules accepted: Orders

## 2018-09-24 NOTE — Telephone Encounter (Signed)
I called the patient.  Over the last 3 weeks she has noted some increase in tremor of the arms and jaw.  She denies any significant change in mobility, she has not had any falls.  There have been no other medication changes.  I will increase the Sinemet taking 1.5 tablets 3 times daily.  The use of anticholinergic medications is probably not a good idea as the patient does report underlying memory issues following ECT treatments.

## 2018-09-24 NOTE — Telephone Encounter (Signed)
Pt is asking for a call to discuss her tremors worsening

## 2018-09-24 NOTE — Telephone Encounter (Signed)
I reached out to the pt and she states her tremors and fatigue have increased.   Pt confirmed she is still taking Sinemet 25/100 1 tab tid and Requip 2 mg 1 tab by mouth bid.

## 2018-09-25 ENCOUNTER — Telehealth (HOSPITAL_COMMUNITY): Payer: Self-pay | Admitting: Radiology

## 2018-09-25 NOTE — Telephone Encounter (Signed)
COVID-19 Pre-Screening Questions:  . Do you currently have a fever?no(yes = cancel and refer to pcp for e-visit) . Have you recently travelled on a cruise, internationally, or to [place], [place], [place], [place], [place], or [place], [place] [employer]) ? NO (yes = cancel, stay home, monitor symptoms, and contact pcp or initiate e-visit if symptoms develop) . Have you been in contact with someone that is currently pending confirmation of Covid19 testing or has been confirmed to have the [place] virus?  NOyes = cancel, stay home, away from tested individual, monitor symptoms, and contact pcp or initiate e-visit if symptoms develop) . Are you currently experiencing fatigue or cough? NO (yes = pt should be prepared to have a mask placed at the time of their visit). .  .  . Reiterated no additional visitors. Eartha Inch no earlier than 15 minutes before appointment time. . Please bring own mask.

## 2018-09-26 ENCOUNTER — Other Ambulatory Visit: Payer: 59

## 2018-09-26 ENCOUNTER — Other Ambulatory Visit: Payer: Self-pay

## 2018-09-26 ENCOUNTER — Ambulatory Visit (HOSPITAL_COMMUNITY): Payer: 59 | Attending: Cardiology

## 2018-09-26 DIAGNOSIS — R0600 Dyspnea, unspecified: Secondary | ICD-10-CM | POA: Diagnosis not present

## 2018-09-26 DIAGNOSIS — R600 Localized edema: Secondary | ICD-10-CM | POA: Insufficient documentation

## 2018-09-27 ENCOUNTER — Telehealth: Payer: Self-pay

## 2018-09-27 NOTE — Telephone Encounter (Signed)
-----   Message from Consuelo Pandy, [place] sent at 09/27/2018 10:18 AM EDT ----- Kermit Balo news! Her heart is strong. No muscle weakness (systolic heart failure), but she does have impairment with the way the heart relaxes after each squeeze. This is called diastolic dysfunction and if fluid builds up into lungs it is called diastolic heart failure. 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 The treatment for this going forward is continuation of daily lasix and blood pressure control. Try to keep BP < 130/80. It is also important to watch salt intake. Be careful with "hidden salt" in foods (processed foods, canned/ frozen foods, restaurants/take-out, sports drinks). Be sure to weigh daily. Call our office if > 3 lb in 24 hr or > 5 lb in 1 week.

## 2018-09-27 NOTE — Telephone Encounter (Signed)
Notes recorded by Frederik Schmidt, RN on 09/27/2018 at 10:24 AM EDT  The patient has been notified of the result and verbalized understanding. All questions (if any) were answered.  Frederik Schmidt, RN 09/27/2018 10:24 AM   ------

## 2018-09-28 ENCOUNTER — Telehealth: Payer: Self-pay | Admitting: Neurology

## 2018-09-28 NOTE — Telephone Encounter (Signed)
Pt called stating that she is having trouble putting one leg in front of the other and she would like to know if this has to do with her Parkinson's. Please advise.

## 2018-10-01 NOTE — Telephone Encounter (Signed)
I called the patient.  The patient was increased on her Sinemet last week, she is already starting note some improvement in her ability to walk.  We will continue the current dose of Sinemet, if she is not doing well over the upcoming weeks, she will call our office and we will get her worked in sooner than her September 2020 appointment.

## 2018-10-02 DIAGNOSIS — N183 Chronic kidney disease, stage 3 (moderate): Secondary | ICD-10-CM | POA: Diagnosis not present

## 2018-10-02 MED FILL — VALACYCLOVIR HCL 500 MG TAB: 500 | 90 days supply | Qty: 90 | Fill #3

## 2018-10-02 MED FILL — ACCU-CHEK FASTCLIX LANCETS: 34 days supply | Qty: 102 | Fill #6

## 2018-10-02 MED FILL — PANTOPRAZOLE SOD DR 40 MG T: 40 | 45 days supply | Qty: 90 | Fill #0

## 2018-10-02 MED FILL — ACCU-CHEK GUIDE TEST STRIP: 90 days supply | Qty: 400 | Fill #0

## 2018-10-03 MED FILL — HYDROCHLOROTHIAZIDE 12.5 MG: 12.5 | 90 days supply | Qty: 90 | Fill #0

## 2018-10-03 MED FILL — LOSARTAN POTASSIUM 100 MG T: 100 | 90 days supply | Qty: 90 | Fill #0

## 2018-10-08 MED FILL — TRINTELLIX 20 MG TABLET: 20 | 90 days supply | Qty: 90 | Fill #0

## 2018-10-08 MED FILL — MONTELUKAST SOD 10 MG TAB: 10 | 30 days supply | Qty: 30 | Fill #5

## 2018-10-08 MED FILL — CARBIDOPA/LEVO 25/100 TAB: 25-100 | 45 days supply | Qty: 135 | Fill #0

## 2018-10-10 DIAGNOSIS — F251 Schizoaffective disorder, depressive type: Secondary | ICD-10-CM | POA: Diagnosis not present

## 2018-10-10 DIAGNOSIS — N183 Chronic kidney disease, stage 3 (moderate): Secondary | ICD-10-CM | POA: Diagnosis not present

## 2018-10-12 ENCOUNTER — Inpatient Hospital Stay: Payer: 59 | Attending: Oncology

## 2018-10-12 ENCOUNTER — Telehealth: Payer: Self-pay | Admitting: Oncology

## 2018-10-12 ENCOUNTER — Other Ambulatory Visit: Payer: Self-pay

## 2018-10-12 DIAGNOSIS — Z79899 Other long term (current) drug therapy: Secondary | ICD-10-CM | POA: Insufficient documentation

## 2018-10-12 DIAGNOSIS — N2889 Other specified disorders of kidney and ureter: Secondary | ICD-10-CM | POA: Diagnosis not present

## 2018-10-12 DIAGNOSIS — Z7982 Long term (current) use of aspirin: Secondary | ICD-10-CM | POA: Insufficient documentation

## 2018-10-12 DIAGNOSIS — D649 Anemia, unspecified: Secondary | ICD-10-CM | POA: Diagnosis not present

## 2018-10-12 DIAGNOSIS — D472 Monoclonal gammopathy: Secondary | ICD-10-CM | POA: Diagnosis not present

## 2018-10-12 LAB — CMP (CANCER CENTER ONLY)
ALT: <6 U/L (ref 0–44)
AST: 16 U/L (ref 15–41)
Albumin: 3.5 g/dL (ref 3.5–5.0)
Alkaline Phosphatase: 55 U/L (ref 38–126)
Anion gap: 10 (ref 5–15)
BUN: 15 mg/dL (ref 8–23)
CO2: 29 mmol/L (ref 22–32)
Calcium: 9 mg/dL (ref 8.9–10.3)
Chloride: 102 mmol/L (ref 98–111)
Creatinine: 1.58 mg/dL — ABNORMAL HIGH (ref 0.44–1.00)
GFR, Est AFR Am: 40 mL/min — ABNORMAL LOW (ref >60)
GFR, Estimated: 34 mL/min — ABNORMAL LOW (ref >60)
Glucose, Bld: 166 mg/dL — ABNORMAL HIGH (ref 70–99)
Potassium: 4.1 mmol/L (ref 3.5–5.1)
Sodium: 141 mmol/L (ref 135–145)
Total Bilirubin: 0.2 mg/dL — ABNORMAL LOW (ref 0.3–1.2)
Total Protein: 7.4 g/dL (ref 6.5–8.1)

## 2018-10-12 LAB — CBC WITH DIFFERENTIAL (CANCER CENTER ONLY)
Abs Immature Granulocytes: 0.04 10*3/uL (ref 0.00–0.07)
Basophils Absolute: 0 10*3/uL (ref 0.0–0.1)
Basophils Relative: 0 %
Eosinophils Absolute: 0.1 10*3/uL (ref 0.0–0.5)
Eosinophils Relative: 1 %
HCT: 34.9 % — ABNORMAL LOW (ref 36.0–46.0)
Hemoglobin: 11.2 g/dL — ABNORMAL LOW (ref 12.0–15.0)
Immature Granulocytes: 1 %
Lymphocytes Relative: 35 %
Lymphs Abs: 2 10*3/uL (ref 0.7–4.0)
MCH: 32.7 pg (ref 26.0–34.0)
MCHC: 32.1 g/dL (ref 30.0–36.0)
MCV: 101.7 fL — ABNORMAL HIGH (ref 80.0–100.0)
Monocytes Absolute: 0.7 10*3/uL (ref 0.1–1.0)
Monocytes Relative: 13 %
Neutro Abs: 2.8 10*3/uL (ref 1.7–7.7)
Neutrophils Relative %: 50 %
Platelet Count: 247 10*3/uL (ref 150–400)
RBC: 3.43 MIL/uL — ABNORMAL LOW (ref 3.87–5.11)
RDW: 13.5 % (ref 11.5–15.5)
WBC Count: 5.7 10*3/uL (ref 4.0–10.5)
nRBC: 0 % (ref 0.0–0.2)

## 2018-10-12 MED FILL — LANTUS SOLOSTAR 100 UNITS/M: 100 | 30 days supply | Qty: 15 | Fill #2

## 2018-10-12 MED FILL — MYRBETRIQ ER 50 MG TABLET: 50 | 30 days supply | Qty: 30 | Fill #3

## 2018-10-12 NOTE — Telephone Encounter (Signed)
Patient walked in to change 7/17 appt due to going out of town. Moved to 7/21 per patients request.

## 2018-10-15 LAB — KAPPA/LAMBDA LIGHT CHAINS
Kappa free light chain: 33.6 mg/L — ABNORMAL HIGH (ref 3.3–19.4)
Kappa, lambda light chain ratio: 1.51 (ref 0.26–1.65)
Lambda free light chains: 22.2 mg/L (ref 5.7–26.3)

## 2018-10-16 LAB — MULTIPLE MYELOMA PANEL, SERUM
Albumin SerPl Elph-Mcnc: 3.4 g/dL (ref 2.9–4.4)
Albumin/Glob SerPl: 1.1 (ref 0.7–1.7)
Alpha 1: 0.2 g/dL (ref 0.0–0.4)
Alpha2 Glob SerPl Elph-Mcnc: 0.8 g/dL (ref 0.4–1.0)
B-Globulin SerPl Elph-Mcnc: 1.1 g/dL (ref 0.7–1.3)
Gamma Glob SerPl Elph-Mcnc: 1.1 g/dL (ref 0.4–1.8)
Globulin, Total: 3.3 g/dL (ref 2.2–3.9)
IgA: 332 mg/dL (ref 87–352)
IgG (Immunoglobin G), Serum: 1248 mg/dL (ref 586–1602)
IgM (Immunoglobulin M), Srm: 50 mg/dL (ref 26–217)
Total Protein ELP: 6.7 g/dL (ref 6.0–8.5)

## 2018-10-19 ENCOUNTER — Ambulatory Visit: Payer: Self-pay | Admitting: Oncology

## 2018-10-23 ENCOUNTER — Inpatient Hospital Stay (HOSPITAL_BASED_OUTPATIENT_CLINIC_OR_DEPARTMENT_OTHER): Payer: 59 | Admitting: Oncology

## 2018-10-23 ENCOUNTER — Other Ambulatory Visit: Payer: Self-pay

## 2018-10-23 VITALS — BP 148/68 | HR 84 | Temp 98.5°F | Resp 17 | Ht 60.6 in | Wt 245.1 lb

## 2018-10-23 DIAGNOSIS — R7989 Other specified abnormal findings of blood chemistry: Secondary | ICD-10-CM

## 2018-10-23 DIAGNOSIS — D649 Anemia, unspecified: Secondary | ICD-10-CM

## 2018-10-23 DIAGNOSIS — D472 Monoclonal gammopathy: Secondary | ICD-10-CM

## 2018-10-23 DIAGNOSIS — Z79899 Other long term (current) drug therapy: Secondary | ICD-10-CM | POA: Diagnosis not present

## 2018-10-23 DIAGNOSIS — N2889 Other specified disorders of kidney and ureter: Secondary | ICD-10-CM

## 2018-10-23 DIAGNOSIS — Z7982 Long term (current) use of aspirin: Secondary | ICD-10-CM

## 2018-10-23 MED FILL — FUROSEMIDE 40 MG TAB: 40 | 90 days supply | Qty: 90 | Fill #0

## 2018-10-23 NOTE — Progress Notes (Signed)
Hematology and Oncology Follow Up Visit  Deanna Rowe [id number] 1955/05/24 63 y.o. 10/23/2018 8:56 AM Leighton Ruff, MDBarnes, Benjamine Mola, MD   Principle Diagnosis: 63 year old with monoclonal gammopathy diagnosed in 2018.  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   Current therapy: Active surveillance.  Interim History: Deanna Rowe is here for a repeat evaluation.  Since her last visit, she reports no major changes in her health.  He continues to live independently and attends activities of daily living.  She denies any recent falls or syncope.  She denies any worsening pain in her hip back or shoulder although she has these issues that are chronic.  Their appetite and performance status remains unchanged.   Patient denied any alteration mental status, neuropathy, confusion or dizziness.  Denies any headaches or lethargy.  Denies any night sweats, weight loss or changes in appetite.  Denied orthopnea, dyspnea on exertion or chest discomfort.  Denies shortness of breath, difficulty breathing hemoptysis or cough.  Denies any abdominal distention, nausea, early satiety or dyspepsia.  Denies any hematuria, frequency, dysuria or nocturia.  Denies any skin irritation, dryness or rash.  Denies any ecchymosis or petechiae.  Denies any lymphadenopathy or clotting.  Denies any heat or cold intolerance.  Denies any anxiety or depression.  Remaining review of system is negative.      Medications: No changes noted on review.  Current Outpatient Medications  Medication Sig Dispense Refill  . ACCU-CHEK FASTCLIX LANCETS MISC USE TO CHECK BLOOD SUGAR 3 TIMES A DAY 102 each 0  . ACCU-CHEK GUIDE test strip USE TO CHECK BLOOD SUGAR 3 TIMES PER DAY. 100 each 2  . albuterol (PROVENTIL HFA;VENTOLIN HFA) 108 (90 Base) MCG/ACT inhaler Inhale 2 puffs into the lungs as needed for wheezing or shortness of breath. 18 g 2  . amLODipine  (NORVASC) 5 MG tablet Take 5 mg by mouth every morning.     Marland Kitchen aspirin EC 81 MG tablet Take 81 mg by mouth every evening.    Marland Kitchen atorvastatin (LIPITOR) 10 MG tablet Take 10 mg by mouth at bedtime.  4  . azelastine (ASTELIN) 0.1 % nasal spray Place 2 sprays into both nostrils 2 (two) times daily. Use in each nostril as directed 30 mL 5  . beclomethasone (QVAR) 80 MCG/ACT inhaler Inhale 1 puff into the lungs daily as needed.    . Brexpiprazole (REXULTI) 2 MG TABS Take 2 mg by mouth daily. 30 tablet   . buPROPion (WELLBUTRIN XL) 150 MG 24 hr tablet Take 450 mg by mouth every morning.     . carbidopa-levodopa (SINEMET IR) 25-100 MG tablet 1 TABLET THREE TIMES DAILY 135 tablet 3  . Carbinoxamine Maleate 4 MG TABS Take 1 tablet (4 mg total) by mouth every 6 (six) hours as needed. 30 each 3  . famotidine (PEPCID) 20 MG tablet Take 20 mg by mouth daily.    . fluticasone (FLOVENT HFA) 110 MCG/ACT inhaler Inhale 2 puffs into the lungs 2 (two) times daily. 1 Inhaler 5  . furosemide (LASIX) 40 MG tablet Take 1 tablet (40 mg total) by mouth daily. 30 tablet 6  . gabapentin (NEURONTIN) 300 MG capsule Take 300 mg by mouth 2 (two) times daily.     Marland Kitchen HUMALOG KWIKPEN 100 UNIT/ML KwikPen     . hydrochlorothiazide (HYDRODIURIL) 12.5 MG tablet Take 12.5 mg by mouth daily.    . insulin glargine (LANTUS) 100 UNIT/ML injection Inject 10  Units into the skin every morning.    Marland Kitchen LORazepam (ATIVAN) 1 MG tablet Take 2 mg by mouth 3 (three) times daily.     Marland Kitchen losartan (COZAAR) 100 MG tablet Take 100 mg by mouth daily.    . mirabegron ER (MYRBETRIQ) 50 MG TB24 tablet Take 50 mg by mouth daily.    . montelukast (SINGULAIR) 10 MG tablet Take 1 tablet (10 mg total) by mouth at bedtime. 30 tablet 5  . pantoprazole (PROTONIX) 40 MG tablet Take 40 mg by mouth 2 (two) times daily.     . propranolol (INDERAL) 40 MG tablet Take 1 tablet (40 mg total) by mouth 2 (two) times daily. 180 tablet 3  . rOPINIRole (REQUIP) 2 MG tablet Take 2 mg  by mouth 2 (two) times daily.  3  . valACYclovir (VALTREX) 1000 MG tablet Take 500 mg by mouth daily.     . vitamin C (ASCORBIC ACID) 500 MG tablet Take 500 mg by mouth daily.    . Vitamin D, Ergocalciferol, 2000 units CAPS Take 1 capsule by mouth daily. 2000 units daily.    Marland Kitchen vortioxetine HBr (TRINTELLIX) 20 MG TABS Take 10 mg by mouth daily.     Marland Kitchen zolpidem (AMBIEN) 5 MG tablet Take 5 mg by mouth at bedtime.      No current facility-administered medications for this visit.    Facility-Administered Medications Ordered in Other Visits  Medication Dose Route Frequency Provider Last Rate Last Dose  . tranexamic acid (CYKLOKAPRON) topical -INTRAOP  2,000 mg Topical Once Cecilio Asper, [employer], PA-C         Allergies:  Allergies  Allergen Reactions  . Fluticasone-Salmeterol Anaphylaxis  . Codeine Nausea And Vomiting  . Fetzima [Levomilnacipran] Nausea And Vomiting  . Nsaids Other (See Comments)    Upset stomach   . Trulicity [Dulaglutide] Nausea And Vomiting    Significant and undesirably rapid (40 lbs) weight loss   . Xanax [Alprazolam] Other (See Comments)    disoriented  . Amoxicillin Rash    Has patient had a PCN reaction causing immediate rash, facial/tongue/throat swelling, SOB or lightheadedness with hypotension: Yes Has patient had a PCN reaction causing severe rash involving mucus membranes or skin necrosis: No Has patient had a PCN reaction that required hospitalization: No Has patient had a PCN reaction occurring within the last 10 years: No If all of the above answers are "NO", then may proceed with Cephalosporin use.   . Pseudoephedrine Hcl Er Palpitations    Past Medical History, Surgical history, Social history, and Family History remains without any changes.    Physical Exam: Blood pressure (!) 148/68, pulse 84, temperature 98.5 F (36.9 C), temperature source Oral, resp. rate 17, height 5' 0.6" (1.539 m), weight 245 lb 1.6 oz (111.2 kg), SpO2 98 %.   ECOG:  1    General appearance: Comfortable appearing without any discomfort Head: Normocephalic without any trauma Oropharynx: Mucous membranes are moist and pink without any thrush or ulcers. Eyes: Pupils are equal and round reactive to light. Lymph nodes: No cervical, supraclavicular, inguinal or axillary lymphadenopathy.   Heart:regular rate and rhythm.  S1 and S2 without leg edema. Lung: Clear without any rhonchi or wheezes.  No dullness to percussion. Abdomin: Soft, nontender, nondistended with good bowel sounds.  No hepatosplenomegaly. Musculoskeletal: No joint deformity or effusion.  Full range of motion noted. Neurological: No deficits noted on motor, sensory and deep tendon reflex exam. Skin: No petechial rash or dryness.  Appeared moist.  Lab Results: Lab Results  Component Value Date   WBC 5.7 10/12/2018   HGB 11.2 (L) 10/12/2018   HCT 34.9 (L) 10/12/2018   MCV 101.7 (H) 10/12/2018   PLT 247 10/12/2018     Chemistry      Component Value Date/Time   NA 141 10/12/2018 1121   NA 132 (L) 09/19/2018 0802   NA 142 01/12/2017 0914   K 4.1 10/12/2018 1121   K 4.2 01/12/2017 0914   CL 102 10/12/2018 1121   CO2 29 10/12/2018 1121   CO2 28 01/12/2017 0914   BUN 15 10/12/2018 1121   BUN 22 09/19/2018 0802   BUN 12.0 01/12/2017 0914   CREATININE 1.58 (H) 10/12/2018 1121   CREATININE 1.3 (H) 01/12/2017 0914      Component Value Date/Time   CALCIUM 9.0 10/12/2018 1121   CALCIUM 9.6 01/12/2017 0914   ALKPHOS 55 10/12/2018 1121   ALKPHOS 43 01/12/2017 0914   AST 16 10/12/2018 1121   AST 19 01/12/2017 0914   ALT <6 10/12/2018 1121   ALT 18 01/12/2017 0914   BILITOT 0.2 (L) 10/12/2018 1121   BILITOT 0.28 01/12/2017 0914      Results for Deanna Rowe, Deanna Rowe (MRN [id number]) as of 10/23/2018 09:00  Ref. Range 10/12/2018 11:21  IgG (Immunoglobin G), Serum Latest Ref Range: 586 - 1,602 mg/dL 1,248  IgM (Immunoglobulin M), Srm Latest Ref Range: 26 - 217 mg/dL 50   IgA Latest Ref Range: 87 - 352 mg/dL 332   Results for Deanna Rowe, Deanna Rowe (MRN [id number]) as of 10/23/2018 09:00  Ref. Range 10/12/2018 11:21  M Protein SerPl Elph-Mcnc Latest Ref Range: Not Observed g/dL Not Observed  IFE 1 Unknown Comment     Impression and Plan:  63 year old woman with:   1.    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  She was found to have 0.2 g/dL.   The differential diagnosis was updated today which include monoclonal gammopathy of undetermined significance (MGUS).  Reactive findings are also a possibility.  Her protein studies obtained on 10/12/2018 showed normal serum protein electrophoresis pattern without any M spike at this time.  Immunofixation failed to show any detectable IgG lambda monoclonal protein as of now.  These findings goes against a plasma cell disorder more suggestive of a likely reactive findings.  Based on these findings further work-up and evaluation is not needed at this time. I do not recommend any further intervention.  2.  Renal insufficiency: Creatinine is stable and likely unrelated to plasma cell disorder.  3.  Mild anemia: Likely related to renal insufficiency rather than a plasma cell disorder.  4.  Follow-up: I am happy to see her in the future as needed.  15 minutes was spent face-to-face today with the patient.  More than 50% was dedicated to reviewing her laboratory data, differential diagnosis, and answering questions regarding future plan of care.      Deanna Button, MD 7/21/20208:56 AM

## 2018-10-24 ENCOUNTER — Other Ambulatory Visit: Payer: Self-pay | Admitting: Family Medicine

## 2018-10-24 DIAGNOSIS — D631 Anemia in chronic kidney disease: Secondary | ICD-10-CM | POA: Diagnosis not present

## 2018-10-24 DIAGNOSIS — I129 Hypertensive chronic kidney disease with stage 1 through stage 4 chronic kidney disease, or unspecified chronic kidney disease: Secondary | ICD-10-CM | POA: Diagnosis not present

## 2018-10-24 DIAGNOSIS — E042 Nontoxic multinodular goiter: Secondary | ICD-10-CM

## 2018-10-24 DIAGNOSIS — N2581 Secondary hyperparathyroidism of renal origin: Secondary | ICD-10-CM | POA: Diagnosis not present

## 2018-10-24 DIAGNOSIS — N183 Chronic kidney disease, stage 3 (moderate): Secondary | ICD-10-CM | POA: Diagnosis not present

## 2018-10-30 MED FILL — HUMALOG 100 UNITS/ML KWIKPE: 100 | 36 days supply | Qty: 15 | Fill #1

## 2018-10-30 MED FILL — AMLODIPINE BESYLATE 5 MG TA: 5 | 90 days supply | Qty: 90 | Fill #0

## 2018-11-01 ENCOUNTER — Ambulatory Visit
Admission: RE | Admit: 2018-11-01 | Discharge: 2018-11-01 | Disposition: A | Discharge status: Home / Self Care | Payer: 59 | Source: Ambulatory Visit | Attending: Family Medicine | Admitting: Family Medicine

## 2018-11-01 DIAGNOSIS — E042 Nontoxic multinodular goiter: Secondary | ICD-10-CM | POA: Diagnosis not present

## 2018-11-07 MED FILL — ACCU-CHEK FASTCLIX LANCETS: 34 days supply | Qty: 102 | Fill #7

## 2018-11-08 ENCOUNTER — Ambulatory Visit: Payer: 59 | Admitting: Allergy and Immunology

## 2018-11-12 ENCOUNTER — Other Ambulatory Visit: Payer: Self-pay | Admitting: Cardiology

## 2018-11-12 MED FILL — GABAPENTIN 300 MG CAPSULE: 300 | 90 days supply | Qty: 180 | Fill #0

## 2018-11-12 MED FILL — PROPRANOLOL 40 MG TABLET: 40 | 90 days supply | Qty: 180 | Fill #0

## 2018-11-12 MED FILL — MONTELUKAST SOD 10 MG TAB: 10 | 30 days supply | Qty: 30 | Fill #3

## 2018-11-12 MED FILL — MYRBETRIQ ER 50 MG TABLET: 50 | 30 days supply | Qty: 30 | Fill #4

## 2018-11-12 MED FILL — ATORVASTATIN 10 MG TABLET: 10 | 90 days supply | Qty: 90 | Fill #4

## 2018-11-13 MED FILL — ZOLPIDEM TARTRATE 5 MG TAB: 5 | 90 days supply | Qty: 90 | Fill #0

## 2018-11-13 MED FILL — CARBIDOPA/LEVO 25/100 TAB: 25-100 | 45 days supply | Qty: 135 | Fill #1

## 2018-11-21 MED FILL — rOPINIRole HCL 2 MG TABS: 2 | 90 days supply | Qty: 180 | Fill #0

## 2018-11-21 MED FILL — PANTOPRAZOLE SOD DR 40 MG T: 40 | 45 days supply | Qty: 90 | Fill #0

## 2018-11-21 MED FILL — TECHLITE PEN NDL 32GX1/4": 32G X 6 MM | 90 days supply | Qty: 400 | Fill #1

## 2018-11-21 MED FILL — TECHLITE PEN NDL 32GX1/4: 32G X 6 MM | 90 days supply | Qty: 400 | Fill #1

## 2018-11-27 DIAGNOSIS — Z01419 Encounter for gynecological examination (general) (routine) without abnormal findings: Secondary | ICD-10-CM | POA: Diagnosis not present

## 2018-12-11 MED FILL — REXULTI 2 MG TABLET: 2 | 90 days supply | Qty: 90 | Fill #0

## 2018-12-11 MED FILL — HUMALOG 100 UNITS/ML KWIKPE: 100 | 36 days supply | Qty: 15 | Fill #2

## 2018-12-11 MED FILL — ACCU-CHEK FASTCLIX LANCETS: 34 days supply | Qty: 102 | Fill #8

## 2018-12-11 MED FILL — MONTELUKAST SOD 10 MG TAB: 10 | 30 days supply | Qty: 30 | Fill #4

## 2018-12-11 MED FILL — LANTUS SOLOSTAR 100 UNITS/M: 100 | 30 days supply | Qty: 15 | Fill #3

## 2018-12-11 MED FILL — buPROPion HCL ER (XL) 150 M: 150 | 90 days supply | Qty: 270 | Fill #0

## 2018-12-13 DIAGNOSIS — R8279 Other abnormal findings on microbiological examination of urine: Secondary | ICD-10-CM | POA: Diagnosis not present

## 2018-12-13 DIAGNOSIS — R350 Frequency of micturition: Secondary | ICD-10-CM | POA: Diagnosis not present

## 2018-12-13 DIAGNOSIS — R3914 Feeling of incomplete bladder emptying: Secondary | ICD-10-CM | POA: Diagnosis not present

## 2018-12-13 MED FILL — LORazepam 2 MG TABS: 2 | 90 days supply | Qty: 270 | Fill #0

## 2018-12-15 ENCOUNTER — Telehealth: Payer: Self-pay | Admitting: Diagnostic Neuroimaging

## 2018-12-15 MED ORDER — CARBIDOPA-LEVODOPA 25-100 MG PO TABS: 1.5000 | ORAL_TABLET | Freq: Three times a day (TID) | ORAL | 4 refills | Status: DC

## 2018-12-15 NOTE — Telephone Encounter (Signed)
Patient called in for refill of carb/levo.    Meds ordered this encounter  Medications  . carbidopa-levodopa (SINEMET IR) 25-100 MG tablet    Sig: Take 1.5 tablets by mouth 3 (three) times daily.    Dispense:  450 tablet    Refill:  [place], MD [id number], [id number] AM Certified in Neurology, Neurophysiology and [facility] [address] [place], [place] [address] [phone]

## 2018-12-17 ENCOUNTER — Telehealth: Payer: Self-pay | Admitting: Neurology

## 2018-12-17 MED ORDER — CARBIDOPA-LEVODOPA 25-100 MG PO TABS: 1.5000 | ORAL_TABLET | Freq: Three times a day (TID) | ORAL | 4 refills | Status: DC

## 2018-12-17 MED FILL — CARBIDOPA/LEVO 25/100 TAB: 25-100 | 90 days supply | Qty: 405 | Fill #0

## 2018-12-17 NOTE — Telephone Encounter (Signed)
Pt called stating that since her amount of pills a day has changed on her carbidopa-levodopa (SINEMET IR) 25-100 MG tablet the [facility] is needing a new prescription sent to them with the change. Please advise.

## 2018-12-17 NOTE — Telephone Encounter (Signed)
I reviewed the pt's chart and on 07/23/18 Dr. Jannifer Franklin increased dosage to 1.5 tabs tid of the Sinemet 25/100. I have submitted to Rx to the requested pharmacy.

## 2018-12-18 MED FILL — MYRBETRIQ ER 50 MG TABLET: 50 | 30 days supply | Qty: 30 | Fill #5

## 2018-12-26 MED FILL — LOSARTAN POTASSIUM 100 MG T: 100 | 90 days supply | Qty: 90 | Fill #0

## 2018-12-26 MED FILL — HYDROCHLOROTHIAZIDE 12.5 MG: 12.5 | 90 days supply | Qty: 90 | Fill #0

## 2018-12-27 ENCOUNTER — Encounter: Payer: Self-pay | Admitting: Neurology

## 2018-12-27 ENCOUNTER — Ambulatory Visit (INDEPENDENT_AMBULATORY_CARE_PROVIDER_SITE_OTHER): Payer: 59 | Admitting: Neurology

## 2018-12-27 ENCOUNTER — Other Ambulatory Visit: Payer: Self-pay

## 2018-12-27 VITALS — BP 143/74 | HR 63 | Temp 96.9°F | Ht 65.0 in | Wt 244.0 lb

## 2018-12-27 DIAGNOSIS — G20 Parkinson's disease: Secondary | ICD-10-CM | POA: Diagnosis not present

## 2018-12-27 DIAGNOSIS — G479 Sleep disorder, unspecified: Secondary | ICD-10-CM | POA: Diagnosis not present

## 2018-12-27 MED ORDER — CARBIDOPA-LEVODOPA 25-100 MG PO TABS: 2.0000 | ORAL_TABLET | Freq: Three times a day (TID) | ORAL | 1 refills | Status: DC

## 2018-12-27 NOTE — Progress Notes (Signed)
Reason for visit: Parkinson's disease, memory disturbance, fatigue  Deanna Rowe is an 63 y.o. female  History of present illness:  Deanna Rowe is a 63 year old right-handed black female with a history of Parkinson's disease primarily with right-sided features.  The patient has a history of significant depression in the past and has had ECT treatments, she has some residual memory issues.  Over the last several months, she has not been as active as usual because of the [place] pandemic.  She has gained weight, her fatigue level has increased.  Her husband indicates that she snores quite a bit at night, more recently she has started to talk some in her sleep.  The patient has had some gradual worsening of her tremor, she indicates that her activities of daily living have slowed down considerably.  She feels stiff with the right leg when walking, she has not had any falls.  She is not having any hallucinations on the medication.  The patient believes that her memory has worsened some over time.  She returns to this office for an evaluation.  She currently is on Sinemet 25/100 mg tablets taking 1.5 tablets 3 times daily.  Past Medical History:  Diagnosis Date  . Anxiety   . Arthritis    L HIP  . Asthma   . Avascular necrosis of hip (HCC)    LEFT  . Depression   . Diabetes mellitus   . Diabetes mellitus, type II ([place])   . Dry cough   . GERD (gastroesophageal reflux disease)   . Hypertension   . Insomnia    takes Ambien nightly  . Kidney disease   . Obesity   . Parkinsonism ([place]) 03/02/2017  . PONV (postoperative nausea and vomiting)   . RLS (restless legs syndrome) 01/15/2018  . TIA (transient ischemic attack) 2012   no residual problems  . Urgency incontinence     Past Surgical History:  Procedure Laterality Date  . DILATION AND CURETTAGE OF UTERUS    . ESOPHAGEAL MANOMETRY N/A 09/03/2012   Procedure: ESOPHAGEAL MANOMETRY (EM);  Surgeon: Garlan Fair, MD;   Location: [facility];  Service: Endoscopy;  Laterality: N/A;  . ESOPHAGEAL MANOMETRY N/A 06/19/2017   Procedure: ESOPHAGEAL MANOMETRY (EM);  Surgeon: Clarene Essex, MD;  Location: [facility];  Service: Endoscopy;  Laterality: N/A;  . EXPLORATORY LAPAROTOMY    . JOINT REPLACEMENT  2011   rt total hip  . PILONIDAL CYST / SINUS EXCISION    . TOTAL HIP ARTHROPLASTY Left 10/09/2013   Procedure: LEFT TOTAL HIP ARTHROPLASTY ANTERIOR APPROACH;  Surgeon: [name], MD;  Location: [facility];  Service: Orthopedics;  Laterality: Left;    Family History  Problem Relation Age of Onset  . Alcohol abuse Brother   . Alcohol abuse Brother   . Alcohol abuse Brother   . Alcohol abuse Brother   . Allergic rhinitis Sister   . Diabetes Neg Hx   . Angioedema Neg Hx   . Asthma Neg Hx   . Eczema Neg Hx   . Immunodeficiency Neg Hx   . Urticaria Neg Hx     Social history:  reports that she has never smoked. She has never used smokeless tobacco. She reports that she does not drink alcohol or use drugs.    Allergies  Allergen Reactions  . Fluticasone-Salmeterol Anaphylaxis  . Codeine Nausea And Vomiting  . Fetzima [Levomilnacipran] Nausea And Vomiting  . Nsaids Other (See Comments)    Upset stomach   .  Trulicity [Dulaglutide] Nausea And Vomiting    Significant and undesirably rapid (40 lbs) weight loss   . Xanax [Alprazolam] Other (See Comments)    disoriented  . Amoxicillin Rash    Has patient had a PCN reaction causing immediate rash, facial/tongue/throat swelling, SOB or lightheadedness with hypotension: Yes Has patient had a PCN reaction causing severe rash involving mucus membranes or skin necrosis: No Has patient had a PCN reaction that required hospitalization: No Has patient had a PCN reaction occurring within the last 10 years: No If all of the above answers are "NO", then may proceed with Cephalosporin use.   . Pseudoephedrine Hcl Er Palpitations    Medications:  Prior to Admission  medications   Medication Sig Start Date End Date Taking? Authorizing Provider  ACCU-CHEK FASTCLIX LANCETS MISC USE TO CHECK BLOOD SUGAR 3 TIMES A DAY 02/27/18  Yes Renato Shin, MD  ACCU-CHEK GUIDE test strip USE TO CHECK BLOOD SUGAR 3 TIMES PER DAY. 02/02/17  Yes Renato Shin, MD  albuterol (PROVENTIL HFA;VENTOLIN HFA) 108 (90 Base) MCG/ACT inhaler Inhale 2 puffs into the lungs as needed for wheezing or shortness of breath. 02/08/18  Yes Bobbitt, Sedalia Muta, MD  amLODipine (NORVASC) 5 MG tablet Take 5 mg by mouth every morning.    Yes [provider]  aspirin EC 81 MG tablet Take 81 mg by mouth every evening.   Yes [provider]  atorvastatin (LIPITOR) 10 MG tablet Take 10 mg by mouth at bedtime. 12/05/17  Yes [provider]  azelastine (ASTELIN) 0.1 % nasal spray Place 2 sprays into both nostrils 2 (two) times daily. Use in each nostril as directed 02/08/18  Yes Bobbitt, Sedalia Muta, MD  Brexpiprazole (REXULTI) 2 MG TABS Take 2 mg by mouth daily. 07/10/18  Yes Kathrynn Ducking, MD  buPROPion (WELLBUTRIN XL) 150 MG 24 hr tablet Take 450 mg by mouth every morning.    Yes [provider]  carbidopa-levodopa (SINEMET IR) 25-100 MG tablet Take 1.5 tablets by mouth 3 (three) times daily. 12/17/18  Yes Kathrynn Ducking, MD  fluticasone (FLOVENT HFA) 110 MCG/ACT inhaler Inhale 2 puffs into the lungs 2 (two) times daily. 07/04/18  Yes Bobbitt, Sedalia Muta, MD  furosemide (LASIX) 40 MG tablet Take 1 tablet (40 mg total) by mouth daily. 09/21/18  Yes Lyda Jester M, PA-C  gabapentin (NEURONTIN) 300 MG capsule Take 300 mg by mouth 2 (two) times daily.    Yes [provider]  HUMALOG KWIKPEN 100 UNIT/ML KwikPen Inject 14 Units into the skin 3 (three) times daily.  06/08/18  Yes [provider]  hydrochlorothiazide (HYDRODIURIL) 12.5 MG tablet Take 12.5 mg by mouth daily.   Yes [provider]  insulin glargine (LANTUS) 100 UNIT/ML injection  Inject 34 Units into the skin every morning.    Yes [provider]  LORazepam (ATIVAN) 1 MG tablet Take 2 mg by mouth 3 (three) times daily.    Yes Ricard Dillon, MD  losartan (COZAAR) 100 MG tablet Take 100 mg by mouth daily.   Yes [provider]  mirabegron ER (MYRBETRIQ) 50 MG TB24 tablet Take 50 mg by mouth daily.   Yes [provider]  montelukast (SINGULAIR) 10 MG tablet Take 1 tablet (10 mg total) by mouth at bedtime. 02/08/18  Yes Bobbitt, Sedalia Muta, MD  pantoprazole (PROTONIX) 40 MG tablet Take 40 mg by mouth 2 (two) times daily.    Yes [provider]  propranolol (INDERAL) 40 MG  tablet TAKE 1 TABLET (40 MG TOTAL) BY MOUTH 2 (TWO) TIMES DAILY. 11/12/18  Yes Simmons, Brittainy M, PA-C  rOPINIRole (REQUIP) 2 MG tablet Take 2 mg by mouth 2 (two) times daily. 03/16/15  Yes [provider]  valACYclovir (VALTREX) 1000 MG tablet Take 500 mg by mouth daily.    Yes [provider]  vitamin C (ASCORBIC ACID) 500 MG tablet Take 500 mg by mouth daily.   Yes [provider]  Vitamin D, Ergocalciferol, 2000 units CAPS Take 1 capsule by mouth daily. 2000 units daily.   Yes [provider]  vortioxetine HBr (TRINTELLIX) 20 MG TABS Take 10 mg by mouth daily.    Yes [provider]  zolpidem (AMBIEN) 5 MG tablet Take 5 mg by mouth at bedtime.    Yes [provider]    ROS:  Out of a complete 14 system review of symptoms, the patient complains only of the following symptoms, and all other reviewed systems are negative.  Tremor Walking difficulty Memory disturbance  Blood pressure (!) 143/74, pulse 63, temperature (!) 96.9 F (36.1 C), temperature source Temporal, height 5\' 5"  (1.651 m), weight 244 lb (110.7 kg).  Physical Exam  General: The patient is alert and cooperative at the time of the examination.  The patient is moderately obese.  Skin: No significant peripheral edema is noted.    Neurologic Exam  Mental status: The patient is alert and oriented x 3 at the time of the examination. The Mini-Mental status examination done today shows a total score 27/30.   Cranial nerves: Facial symmetry is present. Speech is normal, no aphasia or dysarthria is noted. Extraocular movements are full. Visual fields are full.  Mild masking of the face is seen.  Motor: The patient has good strength in all 4 extremities.  Sensory examination: Soft touch sensation is symmetric on the face, arms, and legs.  Coordination: The patient has good finger-nose-finger and heel-to-shin bilaterally.  Resting tremors noted on the right upper extremity.  Gait and station: The patient is able to arise from a seated position with arms crossed.  Once up, she is able to ambulate without assistance, decreased arm swing on the right is noted and some tremor while walking is noted.  Tandem gait is slightly unsteady. Romberg is negative. No drift is seen.  Reflexes: Deep tendon reflexes are symmetric.   Assessment/Plan:  1.  Parkinson's disease  2.  Reported memory disturbance  3.  Increased fatigue, snoring  The patient is having some increased problems with bradykinesia, the Sinemet dose will be increased to 25/100 taking 2 tablets 3 times daily.  She will be referred for sleep evaluation given the increased fatigue and snoring.  She will be getting back into an exercise program in the near future.  She will follow-up here in 5 months.  Jill Alexanders MD 12/27/2018 11:12 AM  [facility] [address] [place], [place] [address]  Phone [phone] Fax [phone]

## 2018-12-27 NOTE — Patient Instructions (Signed)
We will increase the Sinemet to 25/100 mg tablets, 2 tablets three times a day.  Sinemet (carbidopa) may result in confusion or hallucinations, drowsiness, nausea, or dizziness. If any significant side effects are noted, please contact our office. Sinemet may not be well absorbed when taken with high protein meals, if tolerated it is best to take 30-45 minutes before you eat.

## 2019-01-02 ENCOUNTER — Ambulatory Visit: Payer: Self-pay | Admitting: Allergy and Immunology

## 2019-01-03 ENCOUNTER — Other Ambulatory Visit: Payer: Self-pay

## 2019-01-03 ENCOUNTER — Encounter: Payer: Self-pay | Admitting: Neurology

## 2019-01-03 ENCOUNTER — Ambulatory Visit (INDEPENDENT_AMBULATORY_CARE_PROVIDER_SITE_OTHER): Payer: 59 | Admitting: Neurology

## 2019-01-03 VITALS — BP 126/78 | HR 65 | Temp 97.1°F | Ht 65.0 in | Wt 245.0 lb

## 2019-01-03 DIAGNOSIS — R635 Abnormal weight gain: Secondary | ICD-10-CM | POA: Insufficient documentation

## 2019-01-03 DIAGNOSIS — R351 Nocturia: Secondary | ICD-10-CM

## 2019-01-03 DIAGNOSIS — G4719 Other hypersomnia: Secondary | ICD-10-CM | POA: Diagnosis not present

## 2019-01-03 DIAGNOSIS — R0683 Snoring: Secondary | ICD-10-CM | POA: Diagnosis not present

## 2019-01-03 DIAGNOSIS — F324 Major depressive disorder, single episode, in partial remission: Secondary | ICD-10-CM

## 2019-01-03 DIAGNOSIS — G478 Other sleep disorders: Secondary | ICD-10-CM | POA: Diagnosis not present

## 2019-01-03 MED FILL — PANTOPRAZOLE SOD DR 40 MG T: 40 | 45 days supply | Qty: 90 | Fill #0

## 2019-01-03 MED FILL — TRINTELLIX 20 MG TABLET: 20 | 90 days supply | Qty: 90 | Fill #1

## 2019-01-03 NOTE — Patient Instructions (Signed)

## 2019-01-03 NOTE — Progress Notes (Signed)
SLEEP MEDICINE CLINIC    Provider:  Larey Seat, MD  Primary Care Physician:  Leighton Ruff, [place] [address]     Referring Provider: Leighton Ruff, [place],  [place] [address]          Chief Complaint according to patient   Patient presents with:     New Patient (Initial Visit)           HISTORY OF PRESENT ILLNESS:  Deanna Rowe is a 63 y.o. year old African American female patient seen on 01/03/2019 .  Chief concern according to patient : " Dr Jannifer Franklin wants me to be seen for sleep apnea"   I have the pleasure of seeing Gladine Jolliffe today, a right-handed  [place] American female with a possible sleep disorder.  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    Sleep relevant medical history: Nocturia: 2-4 times ( depending on lasix) . No Sleep walking,NO REM BD history - confirmed bu husband- he stated sh snores and has apnea.  She also gained weight over the last 2 years 195 before COVID, 3 times weekly at the gym, now 245 ponds...     Family medical /sleep history: daughter , husband and Cousins with CPAP /OSA.   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 status is mariued , with 2 adult daughters, 47 and 69, no grandchildren.  The patient used to work in shifts( Presenter, broadcasting,) Pets are not present, but her daughters bring their dogs over  - she is allergic to dogs (!)> . Tobacco use: never  .  ETOH use - none , Caffeine intake in form of Coffee( none) , no caffeinated Soda-  No Tea . Regular exercise in form of gym- [employer]    Hobbies : " I love to house clean" .    Sleep habits are as follows: The patient's dinner time is between 5.30-6  PM. The patient goes to bed at 9 PM and  Has no trouble to fall asleep. She continues to sleep for only 1- 2 hours, wakes for up to 4  bathroom breaks, the first time at 10 PM, an hour after falling asleep-   She sweats profusely at night, has hot flushes, no palpitations. The bedroom is cool, quiet , dark and shared with her spouse. .  The preferred sleep position is left side , with the support of 3 pillows due to GERD. .  Dreams are reportedly rare.   5.30 AM is the usual rise time pre-GYM- now she sleeps until 7.30 AM. The patient wakes up spontaneously.He/ She reports not feeling refreshed or restored in AM, with symptoms such as dry mouth , morning headaches- not woken by headaches- she feels tired and fatigued.  Naps are taken infrequently, lasting from 1-2 hours and are less refreshing than nocturnal sleep.    Review of Systems: Out of a complete 14 system review, the patient complains of only the following  symptoms, and all other reviewed systems are negative.:  Fatigue, sleepiness , witnessed snoring, fragmented sleep but nocturia and diaphoresis.    How likely are you to doze in the following situations: 0 = not likely, 1 = slight chance, 2 = moderate chance, 3 = high chance   Sitting and Reading? Watching Television? Sitting inactive in a public place (theater or meeting)? As a passenger in a car for an hour without a break? Lying down in the afternoon when circumstances permit? Sitting and talking to someone? Sitting quietly after lunch without alcohol? In a car, while stopped for a few minutes in traffic?   Total = 17/ 24 points   FSS endorsed at 00/ 63 points.  GDS at 4/  15 points.   Social History   Socioeconomic History   Marital status: Married    Spouse name: Not on file   Number of children: Not on file   Years of education: Not on file   Highest education level: Not on file  Occupational History   Not on file  Social Needs   Financial resource strain: Not on file   Food insecurity    Worry: Not on file    Inability: Not on file   Transportation needs    Medical: Not on file    Non-medical: Not on file  Tobacco Use   Smoking status: Never Smoker   Smokeless tobacco: Never Used  Substance and Sexual Activity   Alcohol use: No    Alcohol/week: 0.0 standard drinks   Drug use: No   Sexual activity: Not Currently  Lifestyle   Physical activity    Days per week: Not on file    Minutes per session: Not on file   Stress: Not on file  Relationships   Social connections    Talks on phone: Not on file    Gets together: Not on file    Attends religious service: Not on file    Active member of club or organization: Not on file    Attends meetings of clubs or organizations: Not on file    Relationship status: Not on file  Other Topics Concern   Not on file  Social History Narrative   Lives with husband   Caffeine use: none   Right handed     Family History  Problem Relation Age of Onset   Alcohol abuse Brother    Alcohol abuse Brother    Alcohol abuse Brother    Alcohol abuse Brother    Allergic rhinitis Sister    Diabetes Neg Hx    Angioedema Neg Hx    Asthma Neg Hx    Eczema Neg Hx    Immunodeficiency Neg Hx    Urticaria Neg Hx     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[place])    LEFT   Depression    Diabetes mellitus    Diabetes mellitus, type II ([place])    Dry cough    GERD (gastroesophageal reflux disease)    Hypertension    Insomnia    takes Ambien nightly   Kidney disease    Obesity    Parkinsonism  ([place]) 03/02/2017   PONV (postoperative nausea and vomiting)    RLS (restless legs syndrome) 01/15/2018   TIA (transient ischemic attack) 2012   no residual problems   Urgency incontinence     Past  Surgical History:  Procedure Laterality Date   DILATION AND CURETTAGE OF UTERUS     ESOPHAGEAL MANOMETRY N/A 09/03/2012   Procedure: ESOPHAGEAL MANOMETRY (EM);  Surgeon: Garlan Fair, MD;  Location: [facility];  Service: Endoscopy;  Laterality: N/A;   ESOPHAGEAL MANOMETRY N/A 06/19/2017   Procedure: ESOPHAGEAL MANOMETRY (EM);  Surgeon: Clarene Essex, MD;  Location: [facility];  Service: Endoscopy;  Laterality: N/A;   EXPLORATORY LAPAROTOMY     JOINT REPLACEMENT  2011   rt total hip   PILONIDAL CYST / SINUS EXCISION     TOTAL HIP ARTHROPLASTY Left 10/09/2013   Procedure: LEFT TOTAL HIP ARTHROPLASTY ANTERIOR APPROACH;  Surgeon: [name], MD;  Location: [facility];  Service: Orthopedics;  Laterality: Left;     Current Outpatient Medications on File Prior to Visit  Medication Sig Dispense Refill   ACCU-CHEK FASTCLIX LANCETS MISC USE TO CHECK BLOOD SUGAR 3 TIMES A DAY 102 each 0   ACCU-CHEK GUIDE test strip USE TO CHECK BLOOD SUGAR 3 TIMES PER DAY. 100 each 2   albuterol (PROVENTIL HFA;VENTOLIN HFA) 108 (90 Base) MCG/ACT inhaler Inhale 2 puffs into the lungs as needed for wheezing or shortness of breath. 18 g 2   amLODipine (NORVASC) 5 MG tablet Take 5 mg by mouth every morning.      aspirin EC 81 MG tablet Take 81 mg by mouth every evening.     atorvastatin (LIPITOR) 10 MG tablet Take 10 mg by mouth at bedtime.  4   azelastine (ASTELIN) 0.1 % nasal spray Place 2 sprays into both nostrils 2 (two) times daily. Use in each nostril as directed 30 mL 5   Brexpiprazole (REXULTI) 2 MG TABS Take 2 mg by mouth daily. 30 tablet    buPROPion (WELLBUTRIN XL) 150 MG 24 hr tablet Take 450 mg by mouth every morning.      carbidopa-levodopa (SINEMET IR) 25-100 MG tablet Take 2 tablets  by mouth 3 (three) times daily. 540 tablet 1   fluticasone (FLOVENT HFA) 110 MCG/ACT inhaler Inhale 2 puffs into the lungs 2 (two) times daily. 1 Inhaler 5   furosemide (LASIX) 40 MG tablet Take 1 tablet (40 mg total) by mouth daily. (Patient taking differently: Take 20 mg by mouth every other day. ) 30 tablet 6   gabapentin (NEURONTIN) 300 MG capsule Take 300 mg by mouth 2 (two) times daily.      HUMALOG KWIKPEN 100 UNIT/ML KwikPen Inject 14 Units into the skin 3 (three) times daily.      hydrochlorothiazide (HYDRODIURIL) 12.5 MG tablet Take 12.5 mg by mouth daily.     insulin glargine (LANTUS) 100 UNIT/ML injection Inject 34 Units into the skin every morning.      LORazepam (ATIVAN) 1 MG tablet Take 2 mg by mouth 3 (three) times daily.      losartan (COZAAR) 100 MG tablet Take 100 mg by mouth daily.     mirabegron ER (MYRBETRIQ) 50 MG TB24 tablet Take 50 mg by mouth daily.     montelukast (SINGULAIR) 10 MG tablet Take 1 tablet (10 mg total) by mouth at bedtime. 30 tablet 5   pantoprazole (PROTONIX) 40 MG tablet Take 40 mg by mouth 2 (two) times daily.      propranolol (INDERAL) 40 MG tablet TAKE 1 TABLET (40 MG TOTAL) BY MOUTH 2 (TWO) TIMES DAILY. 180 tablet 2   rOPINIRole (REQUIP) 2 MG tablet Take 2 mg by mouth 2 (two) times daily.  3   valACYclovir (  VALTREX) 1000 MG tablet Take 500 mg by mouth daily.      vitamin C (ASCORBIC ACID) 500 MG tablet Take 500 mg by mouth daily.     Vitamin D, Ergocalciferol, 2000 units CAPS Take 1 capsule by mouth daily. 2000 units daily.     vortioxetine HBr (TRINTELLIX) 20 MG TABS Take 10 mg by mouth daily.      zolpidem (AMBIEN) 5 MG tablet Take 5 mg by mouth at bedtime.      Current Facility-Administered Medications on File Prior to Visit  Medication Dose Route Frequency Provider Last Rate Last Dose   tranexamic acid (CYKLOKAPRON) topical -INTRAOP  2,000 mg Topical Once [place], [employer], PA-C        Allergies  Allergen Reactions    Fluticasone-Salmeterol Anaphylaxis   Codeine Nausea And Vomiting   Fetzima [Levomilnacipran] Nausea And Vomiting   Nsaids Other (See Comments)    Upset stomach    Trulicity [Dulaglutide] Nausea And Vomiting    Significant and undesirably rapid (40 lbs) weight loss    Xanax [Alprazolam] Other (See Comments)    disoriented   Amoxicillin Rash    Has patient had a PCN reaction causing immediate rash, facial/tongue/throat swelling, SOB or lightheadedness with hypotension: Yes Has patient had a PCN reaction causing severe rash involving mucus membranes or skin necrosis: No Has patient had a PCN reaction that required hospitalization: No Has patient had a PCN reaction occurring within the last 10 years: No If all of the above answers are "NO", then may proceed with Cephalosporin use.    Pseudoephedrine Hcl Er Palpitations   review of medication showed Ambien and Ativan to be present, this may be still a case of insomnia , but it is sufficiently treated and closely linked to her anxiety/ depression.   Physical exam:  Today's Vitals   01/03/19 0955  BP: 126/78  Pulse: 65  Temp: (!) 97.1 F (36.2 C)  Weight: 245 lb (111.1 kg)  Height: 5\' 5"  (1.651 m)   Body mass index is 40.77 kg/m.   Wt Readings from Last 3 Encounters:  01/03/19 245 lb (111.1 kg)  12/27/18 244 lb (110.7 kg)  10/23/18 245 lb 1.6 oz (111.2 kg)     Ht Readings from Last 3 Encounters:  01/03/19 5\' 5"  (1.651 m)  12/27/18 5\' 5"  (1.651 m)  10/23/18 5' 0.6" (1.539 m)      General: The patient is awake, alert and appears not in acute distress. Her voice is monotonous, her face masked. Her posture slightly stooped. The patient is well groomed. Head: Normocephalic, atraumatic. Neck is supple. Mallampati : 4,  neck circumference:17 inches . Nasal airflow patent.  Retrognathia is seen.  Dental status:  Cardiovascular:  Regular rate and cardiac rhythm by pulse,  without distended neck veins. Respiratory: Lungs are  clear to auscultation.  Skin:  Without evidence of ankle edema, or rash. Trunk: The patient's posture is erect.   Neurologic exam : no loss of taste or smell-  The patient is awake and alert, oriented to place and time.   Memory subjective described as intact.  Attention span & concentration ability appears normal.  Speech is fluent,  without  dysarthria, dysphonia or aphasia.  Mood and affect are subdued- low volume voice but not hoarse-    Cranial nerves: no loss of smell or taste reported  Pupils are equal and briskly reactive to light. Funduscopic exam deferred.  Extraocular movements in vertical and horizontal planes were intact and without nystagmus. No  Diplopia. Visual fields by finger perimetry are intact. Hearing was intact to soft voice and finger rubbing.    Facial sensation intact to fine touch.  Facial motor strength is symmetric and tongue and uvula move midline.  Neck ROM : rotation, tilt and flexion extension were normal for age and shoulder shrug was symmetrical.    Motor exam:  Symmetric bulk, tone and ROM.   Normal tone without cog wheeling, symmetric grip strength .   Sensory:  Fine touch, pinprick and vibration were  normal.  Proprioception tested in the upper extremities was normal.   Coordination: Rapid alternating movements in the fingers/hands were significantly slowed- delayed .  The Finger-to-nose maneuver was slowed but without evidence of ataxia, dysmetria or tremor. Patient used middle finger instead of index finger even after I reminded her.    Gait and station: Patient could rise unassisted from a seated position, walked without assistive device.  Stance is of normal width/ base and the patient turned with 4 steps. No limp. Normal arm-swing but stooped posture. No cog-wheeling here today, no resting tremor, no pil roll tremor.  Toe and heel walk were deferred.  Deep tendon reflexes: in the  upper and lower extremities are symmetric and intact.    Babinski response was deferred .      After spending a total time of  35  minutes face to face and additional time for physical and neurologic examination, review of laboratory studies,  personal review of imaging studies, reports and results of other testing and review of referral information / records as far as provided in visit, I have established the following assessments:  1)  Excessive daytime sleepiness with high degree of sleepiness after lunch. Morning headaches, dry mouth, BMI and Mallampati are high.  She snores. - screening for OSA is indicated.  2) unclear of secondary parkinsonism on Abilify and post ECT or true Parkinsons disease, se reports responding favorably to sinemet, cotnrolling her tremor ( I could not see any tremor today)  The review of her medication showed Ambien and Ativan to be present, this may be still a case of insomnia , but it is sufficiently treated and closely linked to her anxiety/ depression.  3) Nocturia is depending on diuretic intake, LASIX. 4) No REM BD - (remarkable in a Parkinson patient) but RLS .   My Plan is to proceed with:  1) PSG with REM BD montage -  2) screen for PLM, apnea and for REM muscle tone.   I would like to thank Dr Thea Alken  allowing me to meet with and to take care of this pleasant patient's sleep evaluation..   IElectronically signed by: Larey Seat, MD 01/03/2019 10:07 AM  [facility] Board certified by The [employer] of Sleep Medicine and Diplomate of the [employer] of Sleep Medicine. Board certified In Neurology through the [place], Fellow of the [employer] of Neurology. Medical Director of [facility].

## 2019-01-04 MED FILL — MONTELUKAST SOD 10 MG TAB: 10 | 30 days supply | Qty: 30 | Fill #5

## 2019-01-07 MED FILL — VALACYCLOVIR HCL 500 MG TAB: 500 | 90 days supply | Qty: 90 | Fill #0

## 2019-01-15 DIAGNOSIS — Z23 Encounter for immunization: Secondary | ICD-10-CM | POA: Diagnosis not present

## 2019-01-15 DIAGNOSIS — E559 Vitamin D deficiency, unspecified: Secondary | ICD-10-CM | POA: Diagnosis not present

## 2019-01-15 DIAGNOSIS — Z794 Long term (current) use of insulin: Secondary | ICD-10-CM | POA: Diagnosis not present

## 2019-01-15 DIAGNOSIS — K219 Gastro-esophageal reflux disease without esophagitis: Secondary | ICD-10-CM | POA: Diagnosis not present

## 2019-01-15 DIAGNOSIS — E7800 Pure hypercholesterolemia, unspecified: Secondary | ICD-10-CM | POA: Diagnosis not present

## 2019-01-15 DIAGNOSIS — N183 Chronic kidney disease, stage 3 unspecified: Secondary | ICD-10-CM | POA: Diagnosis not present

## 2019-01-15 DIAGNOSIS — Z0000 Encounter for general adult medical examination without abnormal findings: Secondary | ICD-10-CM | POA: Diagnosis not present

## 2019-01-15 DIAGNOSIS — E1122 Type 2 diabetes mellitus with diabetic chronic kidney disease: Secondary | ICD-10-CM | POA: Diagnosis not present

## 2019-01-15 DIAGNOSIS — I10 Essential (primary) hypertension: Secondary | ICD-10-CM | POA: Diagnosis not present

## 2019-01-15 DIAGNOSIS — E119 Type 2 diabetes mellitus without complications: Secondary | ICD-10-CM | POA: Diagnosis not present

## 2019-01-17 MED FILL — ACCU-CHEK GUIDE TEST STRIP: 90 days supply | Qty: 400 | Fill #1

## 2019-01-17 MED FILL — ACCU-CHEK FASTCLIX LANCETS: 34 days supply | Qty: 102 | Fill #9

## 2019-01-21 ENCOUNTER — Ambulatory Visit (INDEPENDENT_AMBULATORY_CARE_PROVIDER_SITE_OTHER): Payer: 59 | Admitting: Neurology

## 2019-01-21 DIAGNOSIS — G4719 Other hypersomnia: Secondary | ICD-10-CM

## 2019-01-21 DIAGNOSIS — R351 Nocturia: Secondary | ICD-10-CM

## 2019-01-21 DIAGNOSIS — I700 Atherosclerosis of aorta: Secondary | ICD-10-CM | POA: Diagnosis not present

## 2019-01-21 DIAGNOSIS — E042 Nontoxic multinodular goiter: Secondary | ICD-10-CM | POA: Diagnosis not present

## 2019-01-21 DIAGNOSIS — R0683 Snoring: Secondary | ICD-10-CM

## 2019-01-21 DIAGNOSIS — E119 Type 2 diabetes mellitus without complications: Secondary | ICD-10-CM | POA: Diagnosis not present

## 2019-01-21 DIAGNOSIS — R635 Abnormal weight gain: Secondary | ICD-10-CM

## 2019-01-21 DIAGNOSIS — G4733 Obstructive sleep apnea (adult) (pediatric): Secondary | ICD-10-CM

## 2019-01-21 DIAGNOSIS — N1831 Chronic kidney disease, stage 3a: Secondary | ICD-10-CM | POA: Diagnosis not present

## 2019-01-21 DIAGNOSIS — G478 Other sleep disorders: Secondary | ICD-10-CM

## 2019-01-21 DIAGNOSIS — F324 Major depressive disorder, single episode, in partial remission: Secondary | ICD-10-CM

## 2019-01-23 ENCOUNTER — Ambulatory Visit: Payer: 59 | Admitting: Allergy and Immunology

## 2019-01-23 MED FILL — AMLODIPINE BESYLATE 5 MG TA: 5 | 90 days supply | Qty: 90 | Fill #0

## 2019-01-23 MED FILL — LANTUS SOLOSTAR 100 UNITS/M: 100 | 30 days supply | Qty: 15 | Fill #1

## 2019-01-23 MED FILL — HUMALOG 100 UNITS/ML KWIKPE: 100 | 36 days supply | Qty: 15 | Fill #3

## 2019-01-24 MED FILL — ATORVASTATIN 10 MG TABLET: 10 | 90 days supply | Qty: 90 | Fill #0

## 2019-01-24 NOTE — Progress Notes (Signed)
Cardiology Office Note   Date:  01/25/2019   ID:  Deanna, Rowe 07/02/1955, MRN [id number]  PCP:  Leighton Ruff, MD    No chief complaint on file.  PVC, PACs  Wt Readings from Last 3 Encounters:  01/25/19 243 lb (110.2 kg)  01/03/19 245 lb (111.1 kg)  12/27/18 244 lb (110.7 kg)       History of Present Illness: Deanna Rowe is a 63 y.o. female  who is being seen today for f/u given history of symptomatic PVCs and PACs.   History per Ellen Henri: "To summarize her history, she has a history of palpitations and tachycardia. In 2015, she was seen by Dr. Irish Lack for dzziness and palpitations.Dr. Irish Lack ordered an event monitor as well as an echocardiogram to evaluate for structural heart disease. Her echo revealed normal left ventricular systolic function, with an EF of 55-60% as well as normal valvular function. Wall motion was asynchronous consistent with intraventricular conduction delay (IVCD orbundle branch block); there were no regional wall motion abnormalities. Doppler parameters were consistent with abnormal left ventricular relaxation (grade 1 diastolic dysfunction). Heart monitor showed sinus rhythm with no arrhythmias. Labs were also obtained during that time. CBC was negative for anemia. Hemoglobin 12.5.  When she presented 04/16/15, she reported pulse rates in the low 200s with exercise and also noted palpitations, exertional dyspnea and chest pain.I orderedan exercise tolerance to rule out ischemia and exertional arrhythmias as well as a 48-hour Holter monitor. Stress test was negative for ischemia. 48-hour Holter monitor showed normal sinus rhythm with PVCs and PACs. I started her on beta-blocker therapy with low-dose metoprolol. She was seen several weeks in follow-up and reported symptomatic improvement with metoprolol. Since then, she has continued to [place] without any significant recurrence and is here primarily for  medication refills after running out of her metoprolol. Shealso has hypertension, type 2 diabetes and stage II chronic kidney disease, all followed by her PCP."  In 2020: "She now reports new lower extremity edema and dyspnea.  Edema has been present for about 2 weeks now.  She also noted a 10 pound weight gain over the course of 3 weeks as well as new exertional dyspnea and orthopnea."  She was started on Lasix and had resolution of sx.  She does the [employer] with [employer] 2x/week and walks on other days.  Metoprolol was changed to Propranolol in 2019.  Resting tremor was better after the change.  She was diagnosed with Parkinsons and it affects her right hand.  No falls, but some issue with balance.    Denies : Chest pain. Dizziness. Leg edema. Nitroglycerin use. Orthopnea. Palpitations. Paroxysmal nocturnal dyspnea. Shortness of breath. Syncope.   Past Medical History:  Diagnosis Date  . Anxiety   . Arthritis    L HIP  . Asthma   . Avascular necrosis of hip (HCC)    LEFT  . Depression   . Diabetes mellitus   . Diabetes mellitus, type II ([place])   . Dry cough   . GERD (gastroesophageal reflux disease)   . Hypertension   . Insomnia    takes Ambien nightly  . Kidney disease   . Obesity   . Parkinsonism ([place]) 03/02/2017  . PONV (postoperative nausea and vomiting)   . RLS (restless legs syndrome) 01/15/2018  . TIA (transient ischemic attack) 2012   no residual problems  . Urgency incontinence     Past Surgical History:  Procedure Laterality Date  .  DILATION AND CURETTAGE OF UTERUS    . ESOPHAGEAL MANOMETRY N/A 09/03/2012   Procedure: ESOPHAGEAL MANOMETRY (EM);  Surgeon: Garlan Fair, MD;  Location: [facility];  Service: Endoscopy;  Laterality: N/A;  . ESOPHAGEAL MANOMETRY N/A 06/19/2017   Procedure: ESOPHAGEAL MANOMETRY (EM);  Surgeon: Clarene Essex, MD;  Location: [facility];  Service: Endoscopy;  Laterality: N/A;  . EXPLORATORY LAPAROTOMY    . JOINT REPLACEMENT   2011   rt total hip  . PILONIDAL CYST / SINUS EXCISION    . TOTAL HIP ARTHROPLASTY Left 10/09/2013   Procedure: LEFT TOTAL HIP ARTHROPLASTY ANTERIOR APPROACH;  Surgeon: [name], MD;  Location: [facility];  Service: Orthopedics;  Laterality: Left;     Current Outpatient Medications  Medication Sig Dispense Refill  . ACCU-CHEK FASTCLIX LANCETS MISC USE TO CHECK BLOOD SUGAR 3 TIMES A DAY 102 each 0  . ACCU-CHEK GUIDE test strip USE TO CHECK BLOOD SUGAR 3 TIMES PER DAY. 100 each 2  . albuterol (PROVENTIL HFA;VENTOLIN HFA) 108 (90 Base) MCG/ACT inhaler Inhale 2 puffs into the lungs as needed for wheezing or shortness of breath. 18 g 2  . amLODipine (NORVASC) 5 MG tablet Take 5 mg by mouth every morning.     Marland Kitchen aspirin EC 81 MG tablet Take 81 mg by mouth every evening.    Marland Kitchen atorvastatin (LIPITOR) 10 MG tablet Take 10 mg by mouth at bedtime.  4  . azelastine (ASTELIN) 0.1 % nasal spray Place 2 sprays into both nostrils 2 (two) times daily. Use in each nostril as directed 30 mL 5  . Brexpiprazole (REXULTI) 2 MG TABS Take 2 mg by mouth daily. 30 tablet   . buPROPion (WELLBUTRIN XL) 150 MG 24 hr tablet Take 450 mg by mouth every morning.     . carbidopa-levodopa (SINEMET IR) 25-100 MG tablet Take 2 tablets by mouth 3 (three) times daily. 540 tablet 1  . fluticasone (FLOVENT HFA) 110 MCG/ACT inhaler Inhale 2 puffs into the lungs 2 (two) times daily. 1 Inhaler 5  . furosemide (LASIX) 40 MG tablet Take 1 tablet (40 mg total) by mouth daily. (Patient taking differently: Take 20 mg by mouth every other day. ) 30 tablet 6  . gabapentin (NEURONTIN) 300 MG capsule Take 300 mg by mouth 2 (two) times daily.     Marland Kitchen HUMALOG KWIKPEN 100 UNIT/ML KwikPen Inject 14 Units into the skin 3 (three) times daily.     . hydrochlorothiazide (HYDRODIURIL) 12.5 MG tablet Take 12.5 mg by mouth daily.    . insulin glargine (LANTUS) 100 UNIT/ML injection Inject 34 Units into the skin every morning.     Marland Kitchen LORazepam (ATIVAN) 1 MG  tablet Take 2 mg by mouth 3 (three) times daily.     Marland Kitchen losartan (COZAAR) 100 MG tablet Take 100 mg by mouth daily.    . mirabegron ER (MYRBETRIQ) 50 MG TB24 tablet Take 50 mg by mouth daily.    . montelukast (SINGULAIR) 10 MG tablet Take 1 tablet (10 mg total) by mouth at bedtime. 30 tablet 5  . pantoprazole (PROTONIX) 40 MG tablet Take 40 mg by mouth 2 (two) times daily.     . propranolol (INDERAL) 40 MG tablet TAKE 1 TABLET (40 MG TOTAL) BY MOUTH 2 (TWO) TIMES DAILY. 180 tablet 2  . rOPINIRole (REQUIP) 2 MG tablet Take 2 mg by mouth 2 (two) times daily.  3  . valACYclovir (VALTREX) 1000 MG tablet Take 500 mg by mouth daily.     Marland Kitchen  vitamin C (ASCORBIC ACID) 500 MG tablet Take 500 mg by mouth daily.    . Vitamin D, Ergocalciferol, 2000 units CAPS Take 1 capsule by mouth daily. 2000 units daily.    Marland Kitchen vortioxetine HBr (TRINTELLIX) 20 MG TABS Take 10 mg by mouth daily.     Marland Kitchen zolpidem (AMBIEN) 5 MG tablet Take 5 mg by mouth at bedtime.      No current facility-administered medications for this visit.    Facility-Administered Medications Ordered in Other Visits  Medication Dose Route Frequency Provider Last Rate Last Dose  . tranexamic acid (CYKLOKAPRON) topical -INTRAOP  2,000 mg Topical Once Cecilio Asper, [employer], PA-C        Allergies:   Fluticasone-salmeterol, Codeine, Fetzima [levomilnacipran], Nsaids, Trulicity [dulaglutide], Xanax [alprazolam], Amoxicillin, and Pseudoephedrine hcl er    Social History:  The patient  reports that she has never smoked. She has never used smokeless tobacco. She reports that she does not drink alcohol or use drugs.   Family History:  The patient's family history includes Alcohol abuse in her brother, brother, brother, and brother; Allergic rhinitis in her sister.    ROS:  Please see the history of present illness.   Otherwise, review of systems are positive for tremor.   All other systems are reviewed and negative.    PHYSICAL EXAM: VS:  BP 126/80   Pulse 69    Ht 5\' 6"  (1.676 m)   Wt 243 lb (110.2 kg)   BMI 39.22 kg/m  , BMI Body mass index is 39.22 kg/m. GEN: Well nourished, well developed, in no acute distress  HEENT: normal  Neck: no JVD, carotid bruits, or masses Cardiac: RRR; no murmurs, rubs, or gallops,no edema  Respiratory:  clear to auscultation bilaterally, normal work of breathing GI: soft, nontender, nondistended, + BS MS: no deformity or atrophy  Skin: warm and dry, no rash Neuro:  Strength and sensation are intact Psych: euthymic mood, full affect   EKG:   The ekg ordered today demonstrates NSR, no ST changes   Recent Labs: 09/19/2018: NT-Pro BNP 7 10/12/2018: ALT <6; BUN 15; Creatinine 1.58; Hemoglobin 11.2; Platelet Count 247; Potassium 4.1; Sodium 141   Lipid Panel No results found for: CHOL, TRIG, HDL, CHOLHDL, VLDL, LDLCALC, LDLDIRECT   Other studies Reviewed: Additional studies/ records that were reviewed today with results demonstrating: labs. 2020 echo reviewed showing diastolic dysfunction  ASSESSMENT AND PLAN:  1. PACs/PVCs: Propranolol to suppress sx.  Has been helpful for tremor as well.  2. DOE resolved with Lasix.  Chronic diastolic heart failure- appears euvolemic.   01/15/2019 Cr 1.39, K 4.5 from PMD.   3. LE edema: resolved.  4. HTN: The current medical regimen is effective;  continue present plan and medications.  She checks at home. 5. Hyperlipidemia: LDL 41.   Current medicines are reviewed at length with the patient today.  The patient concerns regarding her medicines were addressed.  The following changes have been made:  No change  Labs/ tests ordered today include:  No orders of the defined types were placed in this encounter.   Recommend 150 minutes/week of aerobic exercise Low fat, low carb, high fiber diet recommended  Disposition:   FU in 1 year   Signed, Larae Grooms, MD  01/25/2019 9:42 AM    [facility] [address], [place], [place]   [address] Phone: [phone]; Fax: [phone]

## 2019-01-25 ENCOUNTER — Encounter: Payer: Self-pay | Admitting: Interventional Cardiology

## 2019-01-25 ENCOUNTER — Ambulatory Visit (INDEPENDENT_AMBULATORY_CARE_PROVIDER_SITE_OTHER): Payer: 59 | Admitting: Interventional Cardiology

## 2019-01-25 ENCOUNTER — Other Ambulatory Visit: Payer: Self-pay

## 2019-01-25 VITALS — BP 126/80 | HR 69 | Ht 66.0 in | Wt 243.0 lb

## 2019-01-25 DIAGNOSIS — I10 Essential (primary) hypertension: Secondary | ICD-10-CM

## 2019-01-25 DIAGNOSIS — E782 Mixed hyperlipidemia: Secondary | ICD-10-CM

## 2019-01-25 DIAGNOSIS — R0600 Dyspnea, unspecified: Secondary | ICD-10-CM | POA: Diagnosis not present

## 2019-01-25 DIAGNOSIS — R0609 Other forms of dyspnea: Secondary | ICD-10-CM

## 2019-01-25 DIAGNOSIS — I5032 Chronic diastolic (congestive) heart failure: Secondary | ICD-10-CM | POA: Diagnosis not present

## 2019-01-25 DIAGNOSIS — I493 Ventricular premature depolarization: Secondary | ICD-10-CM

## 2019-01-25 DIAGNOSIS — G4733 Obstructive sleep apnea (adult) (pediatric): Secondary | ICD-10-CM | POA: Insufficient documentation

## 2019-01-25 NOTE — Patient Instructions (Signed)

## 2019-01-25 NOTE — Addendum Note (Signed)
Addended by: Larey Seat on: 01/25/2019 03:48 PM   Modules accepted: Orders

## 2019-01-25 NOTE — Procedures (Signed)
PATIENT'S NAME:  Deanna Rowe, Deanna Rowe DOB:      1956-02-04      MRN:    [id number]     DATE OF RECORDING: 01/21/2019  [facility] REFERRING M.D.:  Leighton Ruff MD  Study Performed:   Diagnostic Polysomnogram with expanded EEG HISTORY:  Deanna Rowe is seen on 01/03/2019: " Dr Jannifer Franklin wants me to be seen for sleep apnea"  I have the pleasure of seeing Deanna Rowe today, a right-handed [place] -American female with a possible sleep disorder.  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   Nocturia: 2-4 times (depending on Lasix), confirmed through husband that patient snores and has apnea.  She also gained weight over the last 2 years, was 195 before COVID, 3 times weekly at the gym, now 245 pounds.  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 The couple has 2 adult daughters, age 56 and 45, no grandchildren.  The patient used to work in shifts (night/ rotating,)  The patient endorsed the Epworth Sleepiness Scale at 17/24 points and the FSS at 54 points.   The patient's weight 245 pounds with a height of 65 (inches), resulting in a BMI of 40.8 kg/m2. The patient's neck circumference measured 17 inches.  CURRENT MEDICATIONS: Proventil, Norvasc, Aspirin, Lipitor, Astelin, Rexulti, Wellbutrin, Sinemet, Flovent, Lasix, Hydrodiuril, Lantus, Ativan, Cozaar, Singulair, Protonix, Inderal, Requip, Valtrex, Vitamin D, Vitamin C, Trintellix, Ambien.   PROCEDURE:  This is a multichannel digital polysomnogram  utilizing the Somnostar 11.2 system.  Electrodes and sensors were applied and monitored per AASM Specifications.   EEG, EOG, Chin and Limb EMG, were sampled at 200 Hz.  ECG, Snore and Nasal Pressure, Thermal Airflow, Respiratory Effort, CPAP Flow and Pressure, Oximetry was sampled at 50 Hz. Digital video and audio were recorded.     BASELINE STUDY: Lights Out was at 22:31 and Lights On at 04:59.  Total recording time (TRT) was 388.5 minutes, with a total sleep time (TST) of 307.5 minutes.  The patient's sleep latency was 30 minutes. REM latency was 0 minutes. The sleep efficiency was 79.2 %.     SLEEP ARCHITECTURE: WASO (Wake after sleep onset) was 55 minutes.  There were 16.5 minutes in Stage N1, 264 minutes Stage N2, 27 minutes Stage N3 and 0 minutes in Stage REM. The percentage of Stage N1 was 5.4%, Stage N2 was 85.9%, Stage N3 was 8.8% and Stage R (REM sleep) was 0%.  RESPIRATORY ANALYSIS:  There were a total of 206 respiratory events:  5 obstructive apneas, 0 central apneas and 201 hypopneas. The patient also had several respiratory event related arousals (RERAs).      The total APNEA/HYPOPNEA INDEX (AHI) was 40.2 /hour.  0 events occurred in REM sleep and 402 events in NREM. The REM AHI was 0 /hour, versus a non-REM AHI of 40.2. The patient spent 131 minutes of total sleep time in the supine position and 177 minutes in non-supine. The supine AHI was 45.8/h versus a non-supine AHI of 36.1/h.  OXYGEN SATURATION & C02:  The Wake baseline 02 saturation was 91%, with the lowest  being 80%. Time spent below 89% saturation equaled 55 minutes.   AROUSALS:  The arousals were noted as: 24 were spontaneous, 3 were associated with PLMs, and 79 were associated with respiratory events. The patient had a total of 8 Periodic Limb Movements. The Periodic Limb Movement (PLM) index was 1.6 and the PLM Arousal index was 00/hour.  Audio and video analysis did not show any abnormal or unusual movements, complex  behaviors, phonations or vocalizations. Nocturia. Snoring was noted. EKG in normal sinus rhythm (NSR).  IMPRESSION:  1. Severe Obstructive Sleep Apnea (OSA) at AHI 40.2/h.  2. Clinically significant hypoxemia.  3. Insignificant Periodic Limb Movement Disorder (PLMD) 4. Primary Snoring  RECOMMENDATIONS:  1. Advise full-night, attended, CPAP titration study to optimize therapy and possible addition of oxygen.     I certify that I have reviewed the entire raw data recording prior to the issuance of this report in accordance with the Standards of Accreditation of the American Academy of Sleep Medicine (AASM)   Larey Seat, MD    01-24-2019 Diplomat, American Board of Psychiatry and Neurology  Diplomat, American Board of [place] Director, [facility] [employer]

## 2019-01-29 ENCOUNTER — Telehealth: Payer: Self-pay | Admitting: Neurology

## 2019-01-29 NOTE — Telephone Encounter (Signed)

## 2019-01-29 NOTE — Telephone Encounter (Signed)
-----   Message from Larey Seat, MD sent at 01/25/2019  3:48 PM EDT -----  IMPRESSION:  1. Severe Obstructive Sleep Apnea (OSA) at AHI 40.2/h.  2. Clinically significant hypoxemia of 55 minutes total time (!).  3. Insignificant Periodic Limb Movement Disorder (PLMD) 4. Primary Snoring  RECOMMENDATIONS:  1. Advise full-night, attended, CPAP titration study to optimize therapy and possible addition of oxygen.

## 2019-02-07 ENCOUNTER — Ambulatory Visit (INDEPENDENT_AMBULATORY_CARE_PROVIDER_SITE_OTHER): Payer: 59 | Admitting: Allergy and Immunology

## 2019-02-07 ENCOUNTER — Encounter: Payer: Self-pay | Admitting: Allergy and Immunology

## 2019-02-07 ENCOUNTER — Other Ambulatory Visit: Payer: Self-pay

## 2019-02-07 ENCOUNTER — Other Ambulatory Visit: Payer: Self-pay | Admitting: Allergy and Immunology

## 2019-02-07 VITALS — BP 114/64 | HR 64 | Temp 96.5°F | Resp 16 | Ht 66.6 in | Wt 245.6 lb

## 2019-02-07 DIAGNOSIS — J4541 Moderate persistent asthma with (acute) exacerbation: Secondary | ICD-10-CM

## 2019-02-07 DIAGNOSIS — J3089 Other allergic rhinitis: Secondary | ICD-10-CM

## 2019-02-07 DIAGNOSIS — K219 Gastro-esophageal reflux disease without esophagitis: Secondary | ICD-10-CM | POA: Diagnosis not present

## 2019-02-07 DIAGNOSIS — H1010 Acute atopic conjunctivitis, unspecified eye: Secondary | ICD-10-CM | POA: Insufficient documentation

## 2019-02-07 DIAGNOSIS — H1013 Acute atopic conjunctivitis, bilateral: Secondary | ICD-10-CM

## 2019-02-07 MED ORDER — OLOPATADINE HCL 0.2 % OP SOLN: 1.0000 [drp] | Freq: Every day | OPHTHALMIC | 5 refills | Status: DC | PRN

## 2019-02-07 MED ORDER — LEVOCETIRIZINE DIHYDROCHLORIDE 5 MG PO TABS: 5.0000 mg | ORAL_TABLET | Freq: Every evening | ORAL | 5 refills | Status: DC

## 2019-02-07 MED ORDER — FLOVENT HFA 220 MCG/ACT IN AERO: INHALATION_SPRAY | RESPIRATORY_TRACT | 5 refills | Status: DC

## 2019-02-07 MED FILL — FLOVENT HFA 220 MCG INHALER: 220 | 30 days supply | Qty: 12 | Fill #0

## 2019-02-07 MED FILL — MONTELUKAST SOD 10 MG TAB: 10 | 30 days supply | Qty: 30 | Fill #0

## 2019-02-07 MED FILL — LEVOCETIRIZINE 5 MG TABLET: 5 | 30 days supply | Qty: 30 | Fill #0

## 2019-02-07 MED FILL — OLOPATADINE HCL 0.2 % SOLN: 0.2 | 25 days supply | Qty: 3 | Fill #0

## 2019-02-07 MED FILL — PROPRANOLOL HCL 40 MG TABS: 40 | 90 days supply | Qty: 180 | Fill #1

## 2019-02-07 NOTE — Assessment & Plan Note (Addendum)
   Continue appropriate aeroallergen avoidance measures.  A prescription has been provided for levocetirizine(Xyzal), 5 mg daily as needed.  Start azelastine nasal spray, 1-2 sprays per nostril 2 times daily as needed.   Nasal saline lavage (NeilMed) has been recommended as needed and prior to medicated nasal sprays along with instructions for proper administration.

## 2019-02-07 NOTE — Assessment & Plan Note (Deleted)
   Continue appropriate reflux lifestyle modifications, pantoprazole as prescribed, and famotidine (Pepcid) 20 mg twice daily.   

## 2019-02-07 NOTE — Assessment & Plan Note (Signed)
Mild exacerbation.  Prednisone has been provided, 20 mg x 4 days, 10 mg x1 day, then stop.  Discontinue Flovent 110 g.  A prescription has been provided for Flovent (fluticasone) 220 g, 2 inhalations via spacer device twice a day.  Continue montelukast 10 mg daily at bedtime and albuterol HFA, 1 to 2 inhalations every 4-6 hours if needed.  The patient has been asked to contact me if her symptoms persist or progress. Otherwise, she may return for follow up in 4 months.

## 2019-02-07 NOTE — Progress Notes (Signed)
Follow-up Note  RE: Deanna Rowe MRN: [id number] DOB: [birth year] Date of Office Visit: 02/07/2019  Primary care provider: Leighton Ruff, MD Referring provider: Leighton Ruff, MD  History of present illness: Deanna Rowe is a 63 y.o. female with persistent asthma, allergic rhinitis, acid reflux, and history of persistent cough presenting today for follow-up.  She was last evaluated in this clinic via televisit on July 04, 2018.  She reports that she has been experiencing coughing and wheezing over the past 2 weeks.  She has been experiencing asthma symptoms multiple times per day, worse at nighttime with increased albuterol requirement.  During her previous visit we had stepped down from Flovent 220 g to Flovent 110 g.  She is still taking montelukast daily.  She also complains of ocular pruritus, lacrimation, nasal congestion, rhinorrhea, and sneezing.  She admits that she has not been taking carbinoxamine because she dislikes the taste and has not been using azelastine nasal spray.  She denies fevers, chills, sinus pressure, and discolored mucus production.  Assessment and plan: Moderate persistent asthma Mild exacerbation.  Prednisone has been provided, 20 mg x 4 days, 10 mg x1 day, then stop.  Discontinue Flovent 110 g.  A prescription has been provided for Flovent (fluticasone) 220 g, 2 inhalations via spacer device twice a day.  Continue montelukast 10 mg daily at bedtime and albuterol HFA, 1 to 2 inhalations every 4-6 hours if needed.  The patient has been asked to contact me if her symptoms persist or progress. Otherwise, she may return for follow up in 4 months.  Perennial allergic rhinitis  Continue appropriate aeroallergen avoidance measures.  A prescription has been provided for levocetirizine(Xyzal), 5 mg daily as needed.  Start azelastine nasal spray, 1-2 sprays per nostril 2 times daily as needed.   Nasal saline lavage (NeilMed)  has been recommended as needed and prior to medicated nasal sprays along with instructions for proper administration.  Allergic conjunctivitis  Treatment plan as outlined above for allergic rhinitis.  A prescription has been provided for Pataday, one drop per eye daily as needed.  I have also recommended eye lubricant drops (i.e., Natural Tears) as needed.  Acid reflux  Continue appropriate reflux lifestyle modifications, pantoprazole as prescribed, and famotidine (Pepcid) 20 mg twice daily.     Meds ordered this encounter  Medications  . Olopatadine HCl (PATADAY) 0.2 % SOLN    Sig: Place 1 drop into both eyes daily as needed.    Dispense:  2.5 mL    Refill:  5  . fluticasone (FLOVENT HFA) 220 MCG/ACT inhaler    Sig: 2 puffs via spacer twice a day    Dispense:  1 Inhaler    Refill:  5  . levocetirizine (XYZAL) 5 MG tablet    Sig: Take 1 tablet (5 mg total) by mouth every evening.    Dispense:  30 tablet    Refill:  5    Diagnostics: Spirometry reveals an FVC of 1.98 L and an FEV1 of 1.62 L (71% predicted) without postbronchodilator improvement.  Please see scanned spirometry results for details.    Physical examination: Blood pressure 114/64, pulse 64, temperature (!) 96.5 F (35.8 C), temperature source Temporal, resp. rate 16, height 5' 6.6" (1.692 m), weight 245 lb 9.6 oz (111.4 kg), SpO2 97 %.  General: Alert, interactive, in no acute distress. HEENT: TMs pearly gray, turbinates moderately edematous without discharge, post-pharynx unremarkable. Neck: Supple without lymphadenopathy. Lungs: Mildly decreased breath sounds bilaterally without wheezing,  rhonchi or rales. CV: Normal S1, S2 without murmurs. Skin: Warm and dry, without lesions or rashes.  The following portions of the patient's history were reviewed and updated as appropriate: allergies, current medications, past family history, past medical history, past social history, past surgical history and problem  list.  Current Outpatient Medications  Medication Sig Dispense Refill  . ACCU-CHEK FASTCLIX LANCETS MISC USE TO CHECK BLOOD SUGAR 3 TIMES A DAY 102 each 0  . ACCU-CHEK GUIDE test strip USE TO CHECK BLOOD SUGAR 3 TIMES PER DAY. 100 each 2  . albuterol (PROVENTIL HFA;VENTOLIN HFA) 108 (90 Base) MCG/ACT inhaler Inhale 2 puffs into the lungs as needed for wheezing or shortness of breath. 18 g 2  . amLODipine (NORVASC) 5 MG tablet Take 5 mg by mouth every morning.     Marland Kitchen aspirin EC 81 MG tablet Take 81 mg by mouth every evening.    Marland Kitchen atorvastatin (LIPITOR) 10 MG tablet Take 10 mg by mouth at bedtime.  4  . azelastine (ASTELIN) 0.1 % nasal spray Place 2 sprays into both nostrils 2 (two) times daily. Use in each nostril as directed 30 mL 5  . Brexpiprazole (REXULTI) 2 MG TABS Take 2 mg by mouth daily. 30 tablet   . buPROPion (WELLBUTRIN XL) 150 MG 24 hr tablet Take 450 mg by mouth every morning.     . carbidopa-levodopa (SINEMET IR) 25-100 MG tablet Take 2 tablets by mouth 3 (three) times daily. 540 tablet 1  . fluticasone (FLOVENT HFA) 110 MCG/ACT inhaler Inhale 2 puffs into the lungs 2 (two) times daily. 1 Inhaler 5  . furosemide (LASIX) 40 MG tablet Take 1 tablet (40 mg total) by mouth daily. (Patient taking differently: Take 20 mg by mouth every other day. ) 30 tablet 6  . gabapentin (NEURONTIN) 300 MG capsule Take 300 mg by mouth 2 (two) times daily.     Marland Kitchen HUMALOG KWIKPEN 100 UNIT/ML KwikPen Inject 14 Units into the skin 3 (three) times daily.     . hydrochlorothiazide (HYDRODIURIL) 12.5 MG tablet Take 12.5 mg by mouth daily.    . insulin glargine (LANTUS) 100 UNIT/ML injection Inject 34 Units into the skin every morning.     Marland Kitchen LORazepam (ATIVAN) 1 MG tablet Take 2 mg by mouth 3 (three) times daily.     Marland Kitchen losartan (COZAAR) 100 MG tablet Take 100 mg by mouth daily.    . mirabegron ER (MYRBETRIQ) 50 MG TB24 tablet Take 50 mg by mouth daily.    . montelukast (SINGULAIR) 10 MG tablet TAKE 1 TABLET  (10 MG TOTAL) BY MOUTH AT BEDTIME. 30 tablet 5  . pantoprazole (PROTONIX) 40 MG tablet Take 40 mg by mouth 2 (two) times daily.     . propranolol (INDERAL) 40 MG tablet TAKE 1 TABLET (40 MG TOTAL) BY MOUTH 2 (TWO) TIMES DAILY. 180 tablet 2  . rOPINIRole (REQUIP) 2 MG tablet Take 2 mg by mouth 2 (two) times daily.  3  . valACYclovir (VALTREX) 1000 MG tablet Take 500 mg by mouth daily.     . vitamin C (ASCORBIC ACID) 500 MG tablet Take 500 mg by mouth daily.    . Vitamin D, Ergocalciferol, 2000 units CAPS Take 1 capsule by mouth daily. 2000 units daily.    Marland Kitchen vortioxetine HBr (TRINTELLIX) 20 MG TABS Take 10 mg by mouth daily.     Marland Kitchen zolpidem (AMBIEN) 5 MG tablet Take 5 mg by mouth at bedtime.     . fluticasone (  FLOVENT HFA) 220 MCG/ACT inhaler 2 puffs via spacer twice a day 1 Inhaler 5  . levocetirizine (XYZAL) 5 MG tablet Take 1 tablet (5 mg total) by mouth every evening. 30 tablet 5  . Olopatadine HCl (PATADAY) 0.2 % SOLN Place 1 drop into both eyes daily as needed. 2.5 mL 5   No current facility-administered medications for this visit.    Facility-Administered Medications Ordered in Other Visits  Medication Dose Route Frequency Provider Last Rate Last Dose  . tranexamic acid (CYKLOKAPRON) topical -INTRAOP  2,000 mg Topical Once Cecilio Asper, [employer], PA-C        Allergies  Allergen Reactions  . Fluticasone-Salmeterol Anaphylaxis  . Codeine Nausea And Vomiting  . Fetzima [Levomilnacipran] Nausea And Vomiting  . Nsaids Other (See Comments)    Upset stomach   . Trulicity [Dulaglutide] Nausea And Vomiting    Significant and undesirably rapid (40 lbs) weight loss   . Xanax [Alprazolam] Other (See Comments)    disoriented  . Amoxicillin Rash    Has patient had a PCN reaction causing immediate rash, facial/tongue/throat swelling, SOB or lightheadedness with hypotension: Yes Has patient had a PCN reaction causing severe rash involving mucus membranes or skin necrosis: No Has patient had a PCN  reaction that required hospitalization: No Has patient had a PCN reaction occurring within the last 10 years: No If all of the above answers are "NO", then may proceed with Cephalosporin use.   . Pseudoephedrine Hcl Er Palpitations    Review of systems: Review of systems negative except as noted in HPI / PMHx or noted below: Constitutional: Negative.  HENT: Negative.   Eyes: Negative.  Respiratory: Negative.   Cardiovascular: Negative.  Gastrointestinal: Negative.  Genitourinary: Negative.  Musculoskeletal: Negative.  Neurological: Negative.  Endo/Heme/Allergies: Negative.  Cutaneous: Negative.   Past Medical History:  Diagnosis Date  . Anxiety   . Arthritis    L HIP  . Asthma   . Avascular necrosis of hip (HCC)    LEFT  . Depression   . Diabetes mellitus   . Diabetes mellitus, type II ([place])   . Dry cough   . GERD (gastroesophageal reflux disease)   . Hypertension   . Insomnia    takes Ambien nightly  . Kidney disease   . Obesity   . Parkinsonism ([place]) 03/02/2017  . PONV (postoperative nausea and vomiting)   . RLS (restless legs syndrome) 01/15/2018  . TIA (transient ischemic attack) 2012   no residual problems  . Urgency incontinence     Family History  Problem Relation Age of Onset  . Alcohol abuse Brother   . Alcohol abuse Brother   . Alcohol abuse Brother   . Alcohol abuse Brother   . Allergic rhinitis Sister   . Diabetes Neg Hx   . Angioedema Neg Hx   . Asthma Neg Hx   . Eczema Neg Hx   . Immunodeficiency Neg Hx   . Urticaria Neg Hx     Social History   Socioeconomic History  . Marital status: Married    Spouse name: Not on file  . Number of children: Not on file  . Years of education: Not on file  . Highest education level: Not on file  Occupational History  . Not on file  Social Needs  . Financial resource strain: Not on file  . Food insecurity    Worry: Not on file    Inability: Not on file  . Transportation needs    Medical: Not  on file    Non-medical: Not on file  Tobacco Use  . Smoking status: Never Smoker  . Smokeless tobacco: Never Used  Substance and Sexual Activity  . Alcohol use: No    Alcohol/week: 0.0 standard drinks  . Drug use: No  . Sexual activity: Not Currently  Lifestyle  . Physical activity    Days per week: Not on file    Minutes per session: Not on file  . Stress: Not on file  Relationships  . Social Herbalist on phone: Not on file    Gets together: Not on file    Attends religious service: Not on file    Active member of club or organization: Not on file    Attends meetings of clubs or organizations: Not on file    Relationship status: Not on file  . Intimate partner violence    Fear of current or ex partner: Not on file    Emotionally abused: Not on file    Physically abused: Not on file    Forced sexual activity: Not on file  Other Topics Concern  . Not on file  Social History Narrative   Lives with husband   Caffeine use: none   Right handed     I appreciate the opportunity to take part in Aretha's care. Please do not hesitate to contact me with questions.  Sincerely,   R. Edgar Frisk, MD

## 2019-02-07 NOTE — Patient Instructions (Addendum)
Moderate persistent asthma Mild exacerbation.  Prednisone has been provided, 20 mg x 4 days, 10 mg x1 day, then stop.  Discontinue Flovent 110 g.  A prescription has been provided for Flovent (fluticasone) 220 g, 2 inhalations via spacer device twice a day.  Continue montelukast 10 mg daily at bedtime and albuterol HFA, 1 to 2 inhalations every 4-6 hours if needed.  The patient has been asked to contact me if her symptoms persist or progress. Otherwise, she may return for follow up in 4 months.  Perennial allergic rhinitis  Continue appropriate aeroallergen avoidance measures.  A prescription has been provided for levocetirizine(Xyzal), 5 mg daily as needed.  Start azelastine nasal spray, 1-2 sprays per nostril 2 times daily as needed.   Nasal saline lavage (NeilMed) has been recommended as needed and prior to medicated nasal sprays along with instructions for proper administration.  Allergic conjunctivitis  Treatment plan as outlined above for allergic rhinitis.  A prescription has been provided for Pataday, one drop per eye daily as needed.  I have also recommended eye lubricant drops (i.e., Natural Tears) as needed.  Acid reflux  Continue appropriate reflux lifestyle modifications, pantoprazole as prescribed, and famotidine (Pepcid) 20 mg twice daily.     Return in about 4 months (around 06/07/2019), or if symptoms worsen or fail to improve.

## 2019-02-07 NOTE — Assessment & Plan Note (Signed)
   Treatment plan as outlined above for allergic rhinitis.  A prescription has been provided for Pataday, one drop per eye daily as needed.  I have also recommended eye lubricant drops (i.e., Natural Tears) as needed. 

## 2019-02-07 NOTE — Assessment & Plan Note (Signed)
   Continue appropriate reflux lifestyle modifications, pantoprazole as prescribed, and famotidine (Pepcid) 20 mg twice daily.   

## 2019-02-08 MED FILL — FAMOTIDINE 20 MG TABLET: 20 | 30 days supply | Qty: 60 | Fill #3

## 2019-02-12 DIAGNOSIS — H5213 Myopia, bilateral: Secondary | ICD-10-CM | POA: Diagnosis not present

## 2019-02-13 MED FILL — GABAPENTIN 300 MG CAPSULE: 300 | 90 days supply | Qty: 180 | Fill #0

## 2019-02-13 MED FILL — CARBIDOPA/LEVO 25/100 TAB: 25-100 | 90 days supply | Qty: 540 | Fill #0

## 2019-02-13 MED FILL — ZOLPIDEM TARTRATE 5 MG TAB: 5 | 90 days supply | Qty: 90 | Fill #0

## 2019-02-15 ENCOUNTER — Other Ambulatory Visit (HOSPITAL_COMMUNITY)
Admission: RE | Admit: 2019-02-15 | Discharge: 2019-02-15 | Disposition: A | Discharge status: Home / Self Care | Payer: 59 | Source: Ambulatory Visit | Attending: Neurology | Admitting: Neurology

## 2019-02-15 DIAGNOSIS — Z20828 Contact with and (suspected) exposure to other viral communicable diseases: Secondary | ICD-10-CM | POA: Insufficient documentation

## 2019-02-15 DIAGNOSIS — Z01812 Encounter for preprocedural laboratory examination: Secondary | ICD-10-CM | POA: Insufficient documentation

## 2019-02-17 LAB — NOVEL CORONAVIRUS, NAA (HOSP ORDER, SEND-OUT TO REF LAB; TAT 18-24 HRS): SARS-CoV-2, NAA: NOT DETECTED

## 2019-02-18 DIAGNOSIS — E1122 Type 2 diabetes mellitus with diabetic chronic kidney disease: Secondary | ICD-10-CM | POA: Diagnosis not present

## 2019-02-18 DIAGNOSIS — N2581 Secondary hyperparathyroidism of renal origin: Secondary | ICD-10-CM | POA: Diagnosis not present

## 2019-02-18 DIAGNOSIS — N183 Chronic kidney disease, stage 3 unspecified: Secondary | ICD-10-CM | POA: Diagnosis not present

## 2019-02-18 DIAGNOSIS — I129 Hypertensive chronic kidney disease with stage 1 through stage 4 chronic kidney disease, or unspecified chronic kidney disease: Secondary | ICD-10-CM | POA: Diagnosis not present

## 2019-02-18 DIAGNOSIS — D631 Anemia in chronic kidney disease: Secondary | ICD-10-CM | POA: Diagnosis not present

## 2019-02-19 ENCOUNTER — Ambulatory Visit (INDEPENDENT_AMBULATORY_CARE_PROVIDER_SITE_OTHER): Payer: 59 | Admitting: Neurology

## 2019-02-19 DIAGNOSIS — G4719 Other hypersomnia: Secondary | ICD-10-CM

## 2019-02-19 DIAGNOSIS — G4733 Obstructive sleep apnea (adult) (pediatric): Secondary | ICD-10-CM

## 2019-02-19 DIAGNOSIS — F324 Major depressive disorder, single episode, in partial remission: Secondary | ICD-10-CM

## 2019-02-19 DIAGNOSIS — G478 Other sleep disorders: Secondary | ICD-10-CM

## 2019-02-19 DIAGNOSIS — R0683 Snoring: Secondary | ICD-10-CM

## 2019-02-19 DIAGNOSIS — G4734 Idiopathic sleep related nonobstructive alveolar hypoventilation: Secondary | ICD-10-CM

## 2019-02-19 MED FILL — PANTOPRAZOLE SOD DR 40 MG T: 40 | 90 days supply | Qty: 180 | Fill #0

## 2019-02-19 MED FILL — MYRBETRIQ ER 50 MG TABLET: 50 | 30 days supply | Qty: 30 | Fill #6

## 2019-02-19 MED FILL — ACCU-CHEK FASTCLIX LANCETS: 34 days supply | Qty: 102 | Fill #10

## 2019-02-20 MED FILL — TECHLITE PEN NDL 32GX1/4: 32G X 6 MM | 90 days supply | Qty: 400 | Fill #2

## 2019-02-20 MED FILL — LANTUS SOLOSTAR 100 UNITS/M: 100 | 30 days supply | Qty: 15 | Fill #2

## 2019-02-20 MED FILL — TECHLITE PEN NDL 32GX1/4": 32G X 6 MM | 90 days supply | Qty: 400 | Fill #2

## 2019-02-20 MED FILL — HUMALOG 100 UNITS/ML KWIKPE: 100 | 89 days supply | Qty: 45 | Fill #0

## 2019-02-25 ENCOUNTER — Telehealth: Payer: Self-pay | Admitting: Neurology

## 2019-02-25 NOTE — Addendum Note (Signed)
Addended by: Larey Seat on: 02/25/2019 12:04 PM   Modules accepted: Orders

## 2019-02-25 NOTE — Telephone Encounter (Signed)
-----   Message from Larey Seat, MD sent at 02/25/2019 12:04 PM EST ----- Patient of Dr Drema Dallas and Dr. Jannifer Franklin  DIAGNOSIS  1. Sleep Apnea was completely controlled under CPAP 6, 7 and 8 cm  water. The patient slept for 114 minutes under 7 cm water  pressure.  2. No evidence of Sleep Related Hypoxemia  3. Normal EKG   PLANS/RECOMMENDATIONS:   Order filed with DME for : Auto titration capable CPAP device with heated humidity and  pressure window from 5-10 cm water with 1 cm EPR and a ResMed  N30 small cradle mask.   1. Any apnea patient should avoid sedatives, hypnotics, and  alcoholic beverage consumption at bedtime.  2. CPAP therapy compliance is defined as 4 hours or more of  nightly use.  3. Weight loss would be highly beneficial in reducing the AHI.

## 2019-02-25 NOTE — Telephone Encounter (Signed)
I called pt. I advised pt that Dr. Brett Fairy reviewed their sleep study results and found that pt was best treated at a CPAP pressure of 6,7,8 cm water pressure. Dr. Brett Fairy recommends that pt starts auto CPAP. I reviewed PAP compliance expectations with the pt. Pt is agreeable to starting a CPAP. I advised pt that an order will be sent to a DME, [facility], and [facility] will call the pt within about one week after they file with the pt's insurance. [facility] will show the pt how to use the machine, fit for masks, and troubleshoot the CPAP if needed. A follow up appt was made for insurance purposes with Debbora Presto, NP on Jan 18,2021 at 10:00 am. Pt verbalized understanding to arrive 15 minutes early and bring their CPAP. A letter with all of this information in it will be mailed to the pt as a reminder. I verified with the pt that the address we have on file is correct. Pt verbalized understanding of results. Pt had no questions at this time but was encouraged to call back if questions arise. I have sent the order to [facility] and have received confirmation that they have received the order.

## 2019-02-25 NOTE — Procedures (Signed)
PATIENT'S NAME:  Deanna Rowe, Deanna Rowe DOB:      05/30/1955      MR#:    [id number]     DATE OF RECORDING: 02/19/2019 REFERRING M.D.:  Leighton Ruff MD Study Performed:  Titration to Positive Airway Pressure HISTORY:  This patient is  returning for CPAP titration following her REM montage PSG performed on 01/21/19 which resulted in  an AHI of 40.2/h, REM AHI of ZERO,  and Spo2 nadir of 80% with 55 minutes of total desaturation time. Frequent nocturia.  The patient endorsed the Epworth Sleepiness Scale at 17/24 points.   The patient's weight 245 pounds with a height of 65 (inches), resulting in a BMI of 40.8 kg/m2. The patient's neck circumference measured 17 inches.  CURRENT MEDICATIONS: Proventil, Norvasc, Aspirin, Lipitor, Astelin, Rexulti, Wellbutrin, Sinemet, Flovent, Lasix, Hydrodiuril, Lantus, Ativan, Cozaar, Singulair, Protonix, Inderal, Requip, Valtrex, Vitamin D, Vitamin C, Trintellix, Ambien.  PROCEDURE:  This is a multichannel digital polysomnogram utilizing the SomnoStar 11.2 system.  Electrodes and sensors were applied and monitored per AASM Specifications.   EEG, EOG, Chin and Limb EMG, were sampled at 200 Hz.  ECG, Snore and Nasal Pressure, Thermal Airflow, Respiratory Effort, CPAP Flow and Pressure, Oximetry was sampled at 50 Hz. Digital video and audio were recorded.      The patient was fitted with a ResMed N30 small cradle mask. CPAP was initiated at 5 cmH20 with heated humidity per AASM split night standards and pressure was advanced to 8 cmH20 because of hypopneas, apneas and desaturations.  At a PAP pressure of 8 cmH20, there was a reduction of the AHI to 0.0/h with improvement of sleep apnea.  Lights Out was at 22:00 and Lights On at 04:58. Total recording time (TRT) was 419 minutes, with a total sleep time (TST) of 297.5 minutes. The patient's sleep latency was 38 minutes. REM latency was 260.5 minutes.  The sleep efficiency was 71.0 %.    SLEEP ARCHITECTURE: WASO  (Wake after sleep onset) was 80 minutes.  There were 17 minutes in Stage N1, 126.5 minutes Stage N2, 136.5 minutes Stage N3 and 17.5 minutes in Stage REM.  The percentage of Stage N1 was 5.7%, Stage N2 was 42.5%, Stage N3 was 45.9% and Stage R (REM sleep) was 5.9%. The sleep architecture was notable for delayed REM sleep onset.   RESPIRATORY ANALYSIS:  There was a total of 0 respiratory events.      The total APNEA/HYPOPNEA INDEX  (AHI) was 0.0 /hour .The patient spent 0 minutes of total sleep time in the supine position and 298 minutes in non-supine. The supine AHI was 0.0, versus a non-supine AHI of 0.0.  OXYGEN SATURATION & C02:  The baseline 02 saturation was 93%, with the lowest being 89%. Time spent below 89% saturation equaled 0 minutes. The arousals were noted as: 23 were spontaneous, 0 were associated with PLMs, 0 were associated with respiratory events. The patient had a total of 0 Periodic Limb Movements.   Audio and video analysis did not show any abnormal or unusual movements, behaviors, phonations or vocalizations.  The patient took 2 bathroom breaks. EKG was in keeping with normal sinus rhythm (NSR).  DIAGNOSIS 1. Sleep Apnea was completely controlled under CPAP 6, 7 and 8 cm water. The patient slept for 114 minutes under 7 cm water pressure.  2. No evidence of Sleep Related Hypoxemia 3. Normal EKG  PLANS/RECOMMENDATIONS:  Auto titration capable CPAP device with heated humidity and pressure window  from 5-10 cm  water with 1 cm EPR and a ResMed N30 small cradle mask.  1. Any apnea patient should avoid sedatives, hypnotics, and alcoholic beverage consumption at bedtime. 2. CPAP therapy compliance is defined as 4 hours or more of nightly use.   A follow up appointment will be scheduled in the Sleep Clinic at [facility].   Please call [phone] with any questions.      I certify that I have reviewed the entire raw data recording prior to the issuance of  this report in accordance with the Standards of Accreditation of the American Academy of Sleep Medicine (AASM)   Larey Seat, M.D. 02-25-2019 Diplomat, American Board of Psychiatry and Neurology  Diplomat, [place] of Sleep Medicine Medical Director, [place] [facility] at [facility]

## 2019-02-26 ENCOUNTER — Other Ambulatory Visit: Payer: Self-pay | Admitting: Family Medicine

## 2019-02-26 DIAGNOSIS — Z1231 Encounter for screening mammogram for malignant neoplasm of breast: Secondary | ICD-10-CM

## 2019-02-27 MED FILL — rOPINIRole HCL 2 MG TABS: 2 | 90 days supply | Qty: 180 | Fill #0

## 2019-03-02 DIAGNOSIS — M25512 Pain in left shoulder: Secondary | ICD-10-CM | POA: Diagnosis not present

## 2019-03-04 MED FILL — LEVOCETIRIZINE 5 MG TABLET: 5 | 30 days supply | Qty: 30 | Fill #1

## 2019-03-10 DIAGNOSIS — M7582 Other shoulder lesions, left shoulder: Secondary | ICD-10-CM | POA: Diagnosis not present

## 2019-03-11 MED FILL — MONTELUKAST SOD 10 MG TAB: 10 | 30 days supply | Qty: 30 | Fill #1

## 2019-03-11 MED FILL — LORazepam 2 MG TABS: 2 | 90 days supply | Qty: 270 | Fill #1

## 2019-03-11 MED FILL — LEVOCETIRIZINE 5 MG TABLET: 5 | 30 days supply | Qty: 30 | Fill #1

## 2019-03-11 MED FILL — FAMOTIDINE 20 MG TABS: 20 | 30 days supply | Qty: 60 | Fill #4

## 2019-03-11 MED FILL — buPROPion HCL ER (XL) 150 M: 150 | 90 days supply | Qty: 270 | Fill #0

## 2019-03-13 DIAGNOSIS — G4733 Obstructive sleep apnea (adult) (pediatric): Secondary | ICD-10-CM | POA: Diagnosis not present

## 2019-03-15 DIAGNOSIS — M25512 Pain in left shoulder: Secondary | ICD-10-CM | POA: Diagnosis not present

## 2019-03-20 MED FILL — MYRBETRIQ ER 50 MG TABLET: 50 | 30 days supply | Qty: 30 | Fill #7

## 2019-03-20 MED FILL — REXULTI 2 MG TABLET: 2 | 90 days supply | Qty: 90 | Fill #1

## 2019-03-25 MED FILL — TRINTELLIX 20 MG TABLET: 20 | 90 days supply | Qty: 90 | Fill #0

## 2019-03-25 MED FILL — HYDROCHLOROTHIAZIDE 12.5 MG: 12.5 | 90 days supply | Qty: 90 | Fill #0

## 2019-03-25 MED FILL — LANTUS SOLOSTAR 100 UNITS/M: 100 | 30 days supply | Qty: 15 | Fill #3

## 2019-03-28 DIAGNOSIS — M25512 Pain in left shoulder: Secondary | ICD-10-CM | POA: Insufficient documentation

## 2019-04-01 ENCOUNTER — Telehealth: Payer: Self-pay | Admitting: Neurology

## 2019-04-01 DIAGNOSIS — G4733 Obstructive sleep apnea (adult) (pediatric): Secondary | ICD-10-CM

## 2019-04-01 DIAGNOSIS — R0683 Snoring: Secondary | ICD-10-CM

## 2019-04-01 DIAGNOSIS — Z9989 Dependence on other enabling machines and devices: Secondary | ICD-10-CM

## 2019-04-01 DIAGNOSIS — M25512 Pain in left shoulder: Secondary | ICD-10-CM | POA: Diagnosis not present

## 2019-04-01 MED FILL — ACCU-CHEK GUIDE STRP: 90 days supply | Qty: 400 | Fill #2

## 2019-04-01 MED FILL — ACCU-CHEK FASTCLIX LANCETS: 25 days supply | Qty: 102 | Fill #0

## 2019-04-01 NOTE — Telephone Encounter (Signed)
Patient called and advised she is having trouble with her cpap machine and would like to know what other options are available.  Please follow up

## 2019-04-01 NOTE — Telephone Encounter (Signed)
Called the patient. She was set up with the machine on 03/13/19. She has only used it 3 days and only about 1.5 hrs average. Patient states that initially she used pillows and it made her feel like she was suffocating and not getting enough air. They she was changed to full face mask and she tried that a couple days. This made her cough constantly. Advised the patient she may could benefit with a mask densensitization with [facility] to see if they could try and work with her and the mask. Advised without the machine her apnea was severe range and we would really need to give the CPAP a try, meaning at least >4 hrs of usage a night. Advised I would send [facility] a message.

## 2019-04-03 DIAGNOSIS — F332 Major depressive disorder, recurrent severe without psychotic features: Secondary | ICD-10-CM | POA: Diagnosis not present

## 2019-04-04 MED FILL — FAMOTIDINE 20 MG TABS: 20 | 30 days supply | Qty: 60 | Fill #5

## 2019-04-04 MED FILL — LEVOCETIRIZINE 5 MG TABLET: 5 | 30 days supply | Qty: 30 | Fill #2

## 2019-04-09 DIAGNOSIS — G4733 Obstructive sleep apnea (adult) (pediatric): Secondary | ICD-10-CM | POA: Diagnosis not present

## 2019-04-13 DIAGNOSIS — G4733 Obstructive sleep apnea (adult) (pediatric): Secondary | ICD-10-CM | POA: Diagnosis not present

## 2019-04-16 ENCOUNTER — Ambulatory Visit (INDEPENDENT_AMBULATORY_CARE_PROVIDER_SITE_OTHER): Payer: 59 | Admitting: Family Medicine

## 2019-04-16 ENCOUNTER — Other Ambulatory Visit (INDEPENDENT_AMBULATORY_CARE_PROVIDER_SITE_OTHER): Payer: Self-pay | Admitting: Family Medicine

## 2019-04-16 ENCOUNTER — Encounter (INDEPENDENT_AMBULATORY_CARE_PROVIDER_SITE_OTHER): Payer: Self-pay | Admitting: Family Medicine

## 2019-04-16 ENCOUNTER — Other Ambulatory Visit: Payer: Self-pay

## 2019-04-16 VITALS — BP 123/70 | HR 68 | Temp 98.0°F | Ht 65.0 in | Wt 237.0 lb

## 2019-04-16 DIAGNOSIS — Z6839 Body mass index (BMI) 39.0-39.9, adult: Secondary | ICD-10-CM

## 2019-04-16 DIAGNOSIS — I10 Essential (primary) hypertension: Secondary | ICD-10-CM

## 2019-04-16 DIAGNOSIS — Z0289 Encounter for other administrative examinations: Secondary | ICD-10-CM

## 2019-04-16 DIAGNOSIS — R0602 Shortness of breath: Secondary | ICD-10-CM

## 2019-04-16 DIAGNOSIS — E1121 Type 2 diabetes mellitus with diabetic nephropathy: Secondary | ICD-10-CM

## 2019-04-16 DIAGNOSIS — R5383 Other fatigue: Secondary | ICD-10-CM | POA: Diagnosis not present

## 2019-04-16 DIAGNOSIS — Z9189 Other specified personal risk factors, not elsewhere classified: Secondary | ICD-10-CM | POA: Diagnosis not present

## 2019-04-16 DIAGNOSIS — E559 Vitamin D deficiency, unspecified: Secondary | ICD-10-CM | POA: Diagnosis not present

## 2019-04-16 DIAGNOSIS — E7849 Other hyperlipidemia: Secondary | ICD-10-CM

## 2019-04-16 DIAGNOSIS — Z794 Long term (current) use of insulin: Secondary | ICD-10-CM | POA: Diagnosis not present

## 2019-04-16 MED FILL — VALACYCLOVIR HCL 500 MG TAB: 500 | 90 days supply | Qty: 90 | Fill #1

## 2019-04-16 MED FILL — LOSARTAN-HCTZ 100-12.5 MG T: 100-12.5 | 90 days supply | Qty: 90 | Fill #0

## 2019-04-16 MED FILL — MONTELUKAST SOD 10 MG TAB: 10 | 30 days supply | Qty: 30 | Fill #2

## 2019-04-16 MED FILL — MYRBETRIQ ER 50 MG TABLET: 50 | 30 days supply | Qty: 30 | Fill #8

## 2019-04-17 LAB — CBC WITH DIFFERENTIAL/PLATELET
Basophils Absolute: 0 10*3/uL (ref 0.0–0.2)
Basos: 0 %
EOS (ABSOLUTE): 0 10*3/uL (ref 0.0–0.4)
Eos: 0 %
Hematocrit: 36.1 % (ref 34.0–46.6)
Hemoglobin: 12.3 g/dL (ref 11.1–15.9)
Immature Grans (Abs): 0.1 10*3/uL (ref 0.0–0.1)
Immature Granulocytes: 1 %
Lymphocytes Absolute: 2.4 10*3/uL (ref 0.7–3.1)
Lymphs: 36 %
MCH: 32.8 pg (ref 26.6–33.0)
MCHC: 34.1 g/dL (ref 31.5–35.7)
MCV: 96 fL (ref 79–97)
Monocytes Absolute: 0.7 10*3/uL (ref 0.1–0.9)
Monocytes: 10 %
Neutrophils Absolute: 3.5 10*3/uL (ref 1.4–7.0)
Neutrophils: 53 %
Platelets: 274 10*3/uL (ref 150–450)
RBC: 3.75 x10E6/uL — ABNORMAL LOW (ref 3.77–5.28)
RDW: 14.1 % (ref 11.7–15.4)
WBC: 6.8 10*3/uL (ref 3.4–10.8)

## 2019-04-17 LAB — COMPREHENSIVE METABOLIC PANEL
ALT: 21 IU/L (ref 0–32)
AST: 23 IU/L (ref 0–40)
Albumin/Globulin Ratio: 1.5 (ref 1.2–2.2)
Albumin: 4.2 g/dL (ref 3.8–4.8)
Alkaline Phosphatase: 58 IU/L (ref 39–117)
BUN/Creatinine Ratio: 10 — ABNORMAL LOW (ref 12–28)
BUN: 11 mg/dL (ref 8–27)
Bilirubin Total: 0.3 mg/dL (ref 0.0–1.2)
CO2: 27 mmol/L (ref 20–29)
Calcium: 9.3 mg/dL (ref 8.7–10.3)
Chloride: 103 mmol/L (ref 96–106)
Creatinine, Ser: 1.1 mg/dL — ABNORMAL HIGH (ref 0.57–1.00)
GFR calc Af Amer: 61 mL/min/{1.73_m2} (ref >59)
GFR calc non Af Amer: 53 mL/min/{1.73_m2} — ABNORMAL LOW (ref >59)
Globulin, Total: 2.8 g/dL (ref 1.5–4.5)
Glucose: 115 mg/dL — ABNORMAL HIGH (ref 65–99)
Potassium: 4.5 mmol/L (ref 3.5–5.2)
Sodium: 142 mmol/L (ref 134–144)
Total Protein: 7 g/dL (ref 6.0–8.5)

## 2019-04-17 LAB — LIPID PANEL WITH LDL/HDL RATIO
Cholesterol, Total: 155 mg/dL (ref 100–199)
HDL: 84 mg/dL (ref >39)
LDL Chol Calc (NIH): 57 mg/dL (ref 0–99)
LDL/HDL Ratio: 0.7 ratio (ref 0.0–3.2)
Triglycerides: 71 mg/dL (ref 0–149)
VLDL Cholesterol Cal: 14 mg/dL (ref 5–40)

## 2019-04-17 LAB — PREALBUMIN: PREALBUMIN: 29 mg/dL (ref 10–36)

## 2019-04-17 LAB — TSH: TSH: 1.78 u[IU]/mL (ref 0.450–4.500)

## 2019-04-17 LAB — T3: T3, Total: 112 ng/dL (ref 71–180)

## 2019-04-17 LAB — HEMOGLOBIN A1C
Est. average glucose Bld gHb Est-mCnc: 180 mg/dL
Hgb A1c MFr Bld: 7.9 % — ABNORMAL HIGH (ref 4.8–5.6)

## 2019-04-17 LAB — INSULIN, RANDOM: INSULIN: 12.3 u[IU]/mL (ref 2.6–24.9)

## 2019-04-17 LAB — VITAMIN D 25 HYDROXY (VIT D DEFICIENCY, FRACTURES): Vit D, 25-Hydroxy: 40.9 ng/mL (ref 30.0–100.0)

## 2019-04-17 LAB — FOLATE: Folate: 13.9 ng/mL (ref >3.0)

## 2019-04-17 LAB — T4, FREE: Free T4: 1.17 ng/dL (ref 0.82–1.77)

## 2019-04-17 LAB — VITAMIN B12: Vitamin B-12: 305 pg/mL (ref 232–1245)

## 2019-04-18 NOTE — Progress Notes (Signed)
Chief Complaint:   OBESITY Deanna Rowe (MR# [id number]) is a 64 y.o. female who presents for evaluation and treatment of obesity and related comorbidities. Current BMI is Body mass index is 39.44 kg/m. Deanna Rowe has been struggling with her weight for many years and has been unsuccessful in either losing weight, maintaining weight loss, or reaching her healthy weight goal.  Deanna Rowe has a history of CVA and Parkinson's, which will have to be considered when we start with exercise.  Deanna Rowe states she is currently in the action stage of change and ready to dedicate time achieving and maintaining a healthier weight. Deanna Rowe is interested in becoming our patient and working on intensive lifestyle modifications including (but not limited to) diet and exercise for weight loss.  Deanna Rowe's habits were reviewed today and are as follows: Her family eats meals together, she thinks her family will eat healthier with her, her desired weight loss is 67 lbs, she started gaining weight after the pandemic, her heaviest weight ever was 246 pounds, she has significant food cravings issues, she snacks frequently in the evenings, she wakes up frequently in the middle of the night to eat, she frequently makes poor food choices and she frequently eats larger portions than normal.  Depression Screen Deanna Rowe's Food and Mood (modified PHQ-9) score was 4.  Depression screen PHQ 2/9 04/16/2019  Decreased Interest 1  Down, Depressed, Hopeless 0  PHQ - 2 Score 1  Altered sleeping 0  Tired, decreased energy 2  Change in appetite 1  Feeling bad or failure about yourself  0  Trouble concentrating 0  Moving slowly or fidgety/restless 0  Suicidal thoughts 0  PHQ-9 Score 4  Difficult doing work/chores Not difficult at all  Some recent data might be hidden   Subjective:   1. Other fatigue Deanna Rowe feels her energy is lower than it should be. This has worsened with weight gain and has not worsened  recently. Deanna Rowe admits to daytime somnolence and admits to waking up still tired. Patient is at risk for obstructive sleep apnea. Patent has a history of symptoms of daytime fatigue. Patient generally gets 7 hours of sleep per night, and states they generally have generally restful sleep. Snoring is present. Apneic episodes are present. Epworth Sleepiness Score is 8.  2. Shortness of breath on exertion Deanna Rowe notes increasing shortness of breath with exercising and seems to be worsening over time with weight gain. She notes getting out of breath sooner with activity than she used to. This has not gotten worse recently. Deanna Rowe denies shortness of breath at rest or orthopnea.  3. 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 She states her blood sugars at home range between 50 and 250 in the last month.  4. Vitamin D deficiency Deanna Rowe is on OTC Vit D, and she has no recent labs. She notes fatigue.  5. Essential hypertension Deanna Rowe's blood pressure is stable on medications. She is ready to work on diet and weight loss.  6. Other hyperlipidemia Deanna Rowe has a history of TIA's and CVA's. She is on Lipitor 10 mg and she is ready to work on diet and weight loss.  7. At risk for heart disease Deanna Rowe is at a higher than average risk for cardiovascular disease due to obesity. Reviewed: no chest pain on exertion, no dyspnea on exertion, and no swelling of ankles.  Assessment/Plan:   1. Other fatigue Deanna Rowe was informed fatigue may be related to obesity, depression or many other causes. Labs will be ordered today, and in the meanwhile Deanna Rowe has agreed to work on diet, exercise and weight loss.  - EKG 12-Lead - T3 - T4, free - TSH - CBC w/Diff/Platelet - B12 - Folate  2. Shortness of breath on exertion Deanna Rowe's shortness of breath appears to be obesity related  and exercise induced. She has agreed to work on weight loss and gradually increase exercise to treat her exercise induced shortness of breath. We will continue to monitor closely.  3. 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 Good blood sugar control is important to decrease the likelihood of diabetic complications such as nephropathy, neuropathy, limb loss, blindness, coronary artery disease, and death. Intensive lifestyle modification including diet, exercise and weight loss were discussed as the first line treatment for diabetes. We will check labs today. Deanna Rowe is to check her blood sugars TID and bring in log. If her blood sugar is below 80 she will decrease Humalog to 12 units qac. We will follow closely.   - Urine Microalbumin w/creat. ratio - Insulin, random - HgB A1c - Comprehensive Metabolic Panel (CMET) - Prealbumin  4. Vitamin D deficiency Low Vitamin D level contributes to fatigue and are associated with obesity, breast, and colon cancer. We will check labs today and she will follow-up for routine testing of vitamin D, at least 2-3 times per year to avoid over-replacement.  - Vitamin D (25 hydroxy)  5. Essential hypertension Deanna Rowe will start her diet and will work on healthy weight loss and exercise to improve blood pressure control. We will watch for signs of hypotension as she continues her lifestyle modifications. Deanna Rowe agrees to continue her medications and we will check labs today.  6. Other hyperlipidemia Cardiovascular risk and specific lipid/LDL goals reviewed. We discussed several lifestyle modifications today. Deanna Rowe agrees to continue Lipitor, and will start her diet, and continue to work on exercise and weight loss efforts. We will check labs today. Orders and follow up as documented in patient record.   Counseling Intensive lifestyle  modifications are the first line treatment for this issue. . Dietary changes: Increase soluble fiber. Decrease simple carbohydrates. . Exercise changes: Moderate to vigorous-intensity aerobic activity 150 minutes per week if tolerated. . Lipid-lowering medications: see documented in medical record.  - Lipid Panel With LDL/HDL Ratio  7. At risk for heart disease Reyana was given approximately 30 minutes of coronary artery disease prevention counseling today. She is 64 y.o. female and has risk factors for heart disease including obesity. We discussed intensive lifestyle modifications today with an emphasis on specific weight loss instructions and strategies.   8. Class 2 severe obesity with serious comorbidity and body mass index (BMI) of 39.0 to 39.9 in adult, unspecified obesity type (HCC) Arlissa is currently in the action stage of change and her goal is to continue with weight loss efforts. I recommend Chelsee begin the structured treatment plan as follows:  She has agreed to on the Category 3 Plan.   We discussed the following behavioral modification strategies today: increasing lean protein intake and meal planning and cooking strategies.  She was informed of the importance of frequent follow-up visits to maximize her success with intensive lifestyle modifications for her multiple health conditions. She was informed we would discuss her lab results  at her next visit unless there is a critical issue that needs to be addressed sooner. Hien agreed to keep her next visit at the agreed upon time to discuss these results.  Objective:   Blood pressure 123/70, pulse 68, temperature 98 F (36.7 C), temperature source Oral, height 5\' 5"  (1.651 m), weight 237 lb (107.5 kg), SpO2 98 %. Body mass index is 39.44 kg/m.  EKG: Normal sinus rhythm, rate 69 BPM.  Indirect Calorimeter completed today shows a VO2 of 240 and a REE of 1672.  Her calculated basal metabolic rate is [id number] thus her basal  metabolic rate is worse than expected.  General: Cooperative, alert, well developed, in no acute distress. HEENT: Conjunctivae and lids unremarkable. Neck: No thyromegaly.  Cardiovascular: Regular rhythm.  Lungs: Normal work of breathing. Extremities: No edema.  Neurologic: No focal deficits.   Lab Results  Component Value Date   CREATININE 1.10 (H) 04/16/2019   BUN 11 04/16/2019   NA 142 04/16/2019   K 4.5 04/16/2019   CL 103 04/16/2019   CO2 27 04/16/2019   Lab Results  Component Value Date   ALT 21 04/16/2019   AST 23 04/16/2019   ALKPHOS 58 04/16/2019   BILITOT 0.3 04/16/2019   Lab Results  Component Value Date   HGBA1C 7.9 (H) 04/16/2019   HGBA1C 6.9 11/25/2016   HGBA1C 6.0 06/10/2016   HGBA1C 5.9 02/12/2016   HGBA1C 6.1 12/01/2015   Lab Results  Component Value Date   INSULIN 12.3 04/16/2019   Lab Results  Component Value Date   TSH 1.780 04/16/2019   Lab Results  Component Value Date   CHOL 155 04/16/2019   HDL 84 04/16/2019   LDLCALC 57 04/16/2019   TRIG 71 04/16/2019   Lab Results  Component Value Date   WBC 6.8 04/16/2019   HGB 12.3 04/16/2019   HCT 36.1 04/16/2019   MCV 96 04/16/2019   PLT 274 04/16/2019   No results found for: IRON, TIBC, FERRITIN  Attestation statements:   Reviewed by clinician on day of visit: allergies, medications, problem list, medical history, surgical history, family history, social history, and previous encounter notes.   I, Trixie Dredge, am acting as transcriptionist for Dennard Nip, MD.  I have reviewed the above documentation for accuracy and completeness, and I agree with the above. - Dennard Nip, MD

## 2019-04-19 ENCOUNTER — Other Ambulatory Visit: Payer: Self-pay

## 2019-04-19 ENCOUNTER — Ambulatory Visit
Admission: RE | Admit: 2019-04-19 | Discharge: 2019-04-19 | Disposition: A | Discharge status: Home / Self Care | Payer: 59 | Source: Ambulatory Visit | Attending: Family Medicine | Admitting: Family Medicine

## 2019-04-19 DIAGNOSIS — Z1231 Encounter for screening mammogram for malignant neoplasm of breast: Secondary | ICD-10-CM

## 2019-04-19 NOTE — Telephone Encounter (Signed)
Pt has called to report that due to having to take her CPAP devise back and forth to [facility] she has only been using the CPAP for about 2 weeks.  Pt has asked that her appointment be r/s to mid March.  Pt accepted an appointment for 03-24. Pt would like a call from [place], [place] on Monday

## 2019-04-22 ENCOUNTER — Ambulatory Visit: Payer: Self-pay | Admitting: Family Medicine

## 2019-04-22 NOTE — Telephone Encounter (Signed)
Called the patient. 3/25 falls outside of the 90 day start up of her CPAP. She will need to be seen prior to 3/9. Patient was rescheduled to 06/04/19 at 1:30 pm. Advised her that her download for the last 13 days have looked great and she just needs to keep using it like she is. She verbalized understanding and had no other questions.

## 2019-04-23 MED FILL — FUROSEMIDE 40 MG TAB: 40 | 90 days supply | Qty: 90 | Fill #1

## 2019-04-25 DIAGNOSIS — G4733 Obstructive sleep apnea (adult) (pediatric): Secondary | ICD-10-CM | POA: Diagnosis not present

## 2019-04-29 DIAGNOSIS — I700 Atherosclerosis of aorta: Secondary | ICD-10-CM | POA: Diagnosis not present

## 2019-04-29 DIAGNOSIS — E042 Nontoxic multinodular goiter: Secondary | ICD-10-CM | POA: Diagnosis not present

## 2019-04-29 DIAGNOSIS — N1831 Chronic kidney disease, stage 3a: Secondary | ICD-10-CM | POA: Diagnosis not present

## 2019-04-29 DIAGNOSIS — E119 Type 2 diabetes mellitus without complications: Secondary | ICD-10-CM | POA: Diagnosis not present

## 2019-04-30 ENCOUNTER — Other Ambulatory Visit: Payer: Self-pay

## 2019-04-30 ENCOUNTER — Encounter (INDEPENDENT_AMBULATORY_CARE_PROVIDER_SITE_OTHER): Payer: Self-pay | Admitting: Family Medicine

## 2019-04-30 ENCOUNTER — Ambulatory Visit (INDEPENDENT_AMBULATORY_CARE_PROVIDER_SITE_OTHER): Payer: 59 | Admitting: Family Medicine

## 2019-04-30 VITALS — BP 114/72 | HR 71 | Temp 98.0°F | Ht 65.0 in | Wt 233.0 lb

## 2019-04-30 DIAGNOSIS — E1165 Type 2 diabetes mellitus with hyperglycemia: Secondary | ICD-10-CM | POA: Diagnosis not present

## 2019-04-30 DIAGNOSIS — Z6838 Body mass index (BMI) 38.0-38.9, adult: Secondary | ICD-10-CM | POA: Diagnosis not present

## 2019-04-30 DIAGNOSIS — E559 Vitamin D deficiency, unspecified: Secondary | ICD-10-CM

## 2019-04-30 DIAGNOSIS — Z9189 Other specified personal risk factors, not elsewhere classified: Secondary | ICD-10-CM | POA: Diagnosis not present

## 2019-05-01 ENCOUNTER — Other Ambulatory Visit: Payer: Self-pay | Admitting: Allergy and Immunology

## 2019-05-01 LAB — MICROALBUMIN / CREATININE URINE RATIO
Creatinine, Urine: 145.5 mg/dL
Microalb/Creat Ratio: 3 mg/g creat (ref 0–29)
Microalbumin, Urine: 3.9 ug/mL

## 2019-05-01 MED FILL — LEVOCETIRIZINE 5 MG TABLET: 5 | 30 days supply | Qty: 30 | Fill #3

## 2019-05-01 MED FILL — AMLODIPINE BESYLATE 5 MG TA: 5 | 90 days supply | Qty: 90 | Fill #1

## 2019-05-01 MED FILL — FAMOTIDINE 20 MG TABS: 20 | 30 days supply | Qty: 60 | Fill #0

## 2019-05-01 NOTE — Progress Notes (Signed)
Chief Complaint:   OBESITY Deanna Rowe is here to discuss her progress with her obesity treatment plan along with follow-up of her obesity related diagnoses. Deanna Rowe is on the Category 3 Plan and states she is following her eating plan approximately 75% of the time. Deanna Rowe states she is exercising 0 minutes 0 times per week.  Today's visit was #: 2 Starting weight: 237 lbs Starting date: 04/16/2019 Today's weight: 233 lbs Today's date: 04/30/2019 Total lbs lost to date: 4 Total lbs lost since last in-office visit: 4  Interim History: Deanna Rowe did very well with weight loss on her Category 3 eating plan. Hunger was controlled, but she would like some additional breakfast options.  Subjective:   Type 2 diabetes mellitus with hyperglycemia Deanna Rowe's fasting blood sugars are mostly between 88 and 165, and her 2 hour post prandial blood sugars are mostly between 85 and 202. She felt hypoglycemic when she was in the 80's and she ate a cookie or some chips.  Lab Results  Component Value Date   HGBA1C 7.9 (H) 04/16/2019   HGBA1C 6.9 11/25/2016   HGBA1C 6.0 06/10/2016   Lab Results  Component Value Date   MICROALBUR <0.7 02/12/2016   LDLCALC 57 04/16/2019   CREATININE 1.10 (H) 04/16/2019   Lab Results  Component Value Date   INSULIN 12.3 04/16/2019   Vitamin D deficiency Deanna Rowe's Vitamin D level was 40.9 on 04/16/19. She is currently taking OTC vit D 2,000 IU daily, plus vitamin D in Calcium. Deanna Rowe is not yet at goal. She denies nausea, vomiting or muscle weakness.  At risk for deficient intake of food The patient is at a higher than average risk of deficient intake of food due to current food recall.  Assessment/Plan:   Type 2 diabetes mellitus with hyperglycemia Good blood sugar control is important to decrease the likelihood of diabetic complications such as nephropathy, neuropathy, limb loss, blindness, coronary artery disease, and death. Intensive  lifestyle modification including diet, exercise and weight loss are the first line of treatment for diabetes. Deanna Rowe was educated on how to use glucose tablets to help current hypoglycemia. She will continue to monitor CBG's and will adjust insulin as needed when her blood sugar drops low.  Vitamin D deficiency Low Vitamin D level contributes to fatigue and are associated with obesity, breast, and colon cancer. Deanna Rowe will continue to take OTC Vitamin D and we will recheck labs in 2 months. She will follow-up for routine testing of Vitamin D, at least 2-3 times per year to avoid over-replacement.  At risk for deficient intake of food Deanna Rowe was given approximately 15 minutes of deficit intake of food prevention counseling today. Deanna Rowe is at risk for eating too few calories based on current food recall. She was encouraged to focus on meeting caloric and protein goals according to her recommended meal plan.   Class 2 severe obesity with serious comorbidity and body mass index (BMI) of 38.0 to 38.9 in adult, unspecified obesity type (HCC) Deanna Rowe is currently in the action stage of change. As such, her goal is to continue with weight loss efforts. She has agreed to the Category 3 Plan with breakfast options.   Exercise goals: No exercise has been prescribed at this time.  Behavioral modification strategies: increasing lean protein intake and decreasing simple carbohydrates.  Deanna Rowe has agreed to follow-up with our clinic in 2 weeks. She was informed of the importance of frequent follow-up visits to maximize her success with intensive lifestyle  modifications for her multiple health conditions.   Objective:   Blood pressure 114/72, pulse 71, temperature 98 F (36.7 C), temperature source Oral, height 5\' 5"  (1.651 m), weight 233 lb (105.7 kg), SpO2 98 %. Body mass index is 38.77 kg/m.  General: Cooperative, alert, well developed, in no acute distress. HEENT: Conjunctivae and lids  unremarkable. Cardiovascular: Regular rhythm.  Lungs: Normal work of breathing. Neurologic: No focal deficits.   Lab Results  Component Value Date   CREATININE 1.10 (H) 04/16/2019   BUN 11 04/16/2019   NA 142 04/16/2019   K 4.5 04/16/2019   CL 103 04/16/2019   CO2 27 04/16/2019   Lab Results  Component Value Date   ALT 21 04/16/2019   AST 23 04/16/2019   ALKPHOS 58 04/16/2019   BILITOT 0.3 04/16/2019   Lab Results  Component Value Date   HGBA1C 7.9 (H) 04/16/2019   HGBA1C 6.9 11/25/2016   HGBA1C 6.0 06/10/2016   HGBA1C 5.9 02/12/2016   HGBA1C 6.1 12/01/2015   Lab Results  Component Value Date   INSULIN 12.3 04/16/2019   Lab Results  Component Value Date   TSH 1.780 04/16/2019   Lab Results  Component Value Date   CHOL 155 04/16/2019   HDL 84 04/16/2019   LDLCALC 57 04/16/2019   TRIG 71 04/16/2019   Lab Results  Component Value Date   WBC 6.8 04/16/2019   HGB 12.3 04/16/2019   HCT 36.1 04/16/2019   MCV 96 04/16/2019   PLT 274 04/16/2019   No results found for: IRON, TIBC, FERRITIN   Ref. Range 04/16/2019 14:51  Vitamin D, 25-Hydroxy Latest Ref Range: 30.0 - 100.0 ng/mL 40.9    Attestation Statements:   Reviewed by clinician on day of visit: allergies, medications, problem list, medical history, surgical history, family history, social history, and previous encounter notes.  Corey Skains, am acting as transcriptionist for Dennard Nip, MD. Dennard Nip, MD  I have reviewed the above documentation for accuracy and completeness, and I agree with the above. -  Dennard Nip, MD

## 2019-05-07 ENCOUNTER — Other Ambulatory Visit: Payer: Self-pay

## 2019-05-07 ENCOUNTER — Ambulatory Visit (INDEPENDENT_AMBULATORY_CARE_PROVIDER_SITE_OTHER): Payer: 59 | Admitting: Neurology

## 2019-05-07 ENCOUNTER — Encounter: Payer: Self-pay | Admitting: Neurology

## 2019-05-07 VITALS — BP 133/76 | HR 74 | Temp 96.9°F | Ht 66.0 in | Wt 242.0 lb

## 2019-05-07 DIAGNOSIS — G2581 Restless legs syndrome: Secondary | ICD-10-CM

## 2019-05-07 DIAGNOSIS — G20 Parkinson's disease: Secondary | ICD-10-CM

## 2019-05-07 MED ORDER — CARBIDOPA-LEVODOPA 25-250 MG PO TABS: 1.0000 | ORAL_TABLET | Freq: Three times a day (TID) | ORAL | 3 refills | Status: DC

## 2019-05-07 MED FILL — CARBIDOPA-LEVODOPA 25-250 T: 25-250 | 90 days supply | Qty: 270 | Fill #0

## 2019-05-07 NOTE — Progress Notes (Signed)
Reason for visit: Parkinson's disease  Deanna Rowe is an 64 y.o. female  History of present illness:  Deanna Rowe is a 64 year old right-handed black female with a history of Parkinson's disease and a history of depression with prior ECT treatments.  The patient has a history of diabetes with the neuropathy, she recent was diagnosed with sleep apnea and is now on CPAP.  She is no longer operating a motor vehicle.  The patient has had a recent episode of freezing when she was trying to go into a ball.  She has pulled back from driving because of this.  The patient has had a least 1 fall since last seen.  She could not get up by herself when she fell.  She believes that her memory issue has been relatively stable over time.  She still has a lot of fatigue during the day even though she is using the CPAP.  The patient is on Sinemet taking the 25/100 mg tablets, she takes 2 tablets 3 times daily.  She does have restless leg syndrome at night relatively frequently.  This may make it somewhat difficult to go to sleep sometimes.  She returns for an evaluation.  The patient is not very active, she currently is not exercising regularly.  Past Medical History:  Diagnosis Date  . Anemia   . Anxiety   . Arthritis    L HIP  . Asthma   . Avascular necrosis of hip (HCC)    LEFT  . Back pain   . Chest pain   . Constipation   . Depression   . Diabetes mellitus   . Diabetes mellitus, type II ([place])   . Dry cough   . Edema, lower extremity   . GERD (gastroesophageal reflux disease)   . High cholesterol   . Hypertension   . Insomnia    takes Ambien nightly  . Joint pain   . Kidney disease   . Kidney disease   . Obesity   . Parkinsonism ([place]) 03/02/2017  . PONV (postoperative nausea and vomiting)   . RLS (restless legs syndrome) 01/15/2018  . Sleep apnea   . Swallowing difficulty   . Thyroid disease   . TIA (transient ischemic attack) 2012   no residual problems  . Urgency  incontinence   . Vitamin D deficiency     Past Surgical History:  Procedure Laterality Date  . DILATION AND CURETTAGE OF UTERUS    . ESOPHAGEAL MANOMETRY N/A 09/03/2012   Procedure: ESOPHAGEAL MANOMETRY (EM);  Surgeon: Garlan Fair, MD;  Location: [facility];  Service: Endoscopy;  Laterality: N/A;  . ESOPHAGEAL MANOMETRY N/A 06/19/2017   Procedure: ESOPHAGEAL MANOMETRY (EM);  Surgeon: Clarene Essex, MD;  Location: [facility];  Service: Endoscopy;  Laterality: N/A;  . EXPLORATORY LAPAROTOMY    . GANGLION CYST EXCISION    . JOINT REPLACEMENT  2011   rt total hip  . KNEE ARTHROSCOPY    . PILONIDAL CYST / SINUS EXCISION    . TOTAL HIP ARTHROPLASTY Left 10/09/2013   Procedure: LEFT TOTAL HIP ARTHROPLASTY ANTERIOR APPROACH;  Surgeon: [name], MD;  Location: [facility];  Service: Orthopedics;  Laterality: Left;    Family History  Problem Relation Age of Onset  . Alcohol abuse Brother   . Alcohol abuse Brother   . Alcohol abuse Brother   . Alcohol abuse Brother   . High blood pressure Mother   . Hyperlipidemia Mother   . Heart disease Mother   .  Heart disease Father   . Alcoholism Father   . Allergic rhinitis Sister   . Diabetes Neg Hx   . Angioedema Neg Hx   . Asthma Neg Hx   . Eczema Neg Hx   . Immunodeficiency Neg Hx   . Urticaria Neg Hx   . Breast cancer Neg Hx     Social history:  reports that she has never smoked. She has never used smokeless tobacco. She reports that she does not drink alcohol or use drugs.    Allergies  Allergen Reactions  . Fluticasone-Salmeterol Anaphylaxis  . Codeine Nausea And Vomiting  . Fetzima [Levomilnacipran] Nausea And Vomiting  . Nsaids Other (See Comments)    Upset stomach   . Trulicity [Dulaglutide] Nausea And Vomiting    Significant and undesirably rapid (40 lbs) weight loss   . Xanax [Alprazolam] Other (See Comments)    disoriented  . Amoxicillin Rash    Has patient had a PCN reaction causing immediate rash,  facial/tongue/throat swelling, SOB or lightheadedness with hypotension: Yes Has patient had a PCN reaction causing severe rash involving mucus membranes or skin necrosis: No Has patient had a PCN reaction that required hospitalization: No Has patient had a PCN reaction occurring within the last 10 years: No If all of the above answers are "NO", then may proceed with Cephalosporin use.   . Pseudoephedrine Hcl Er Palpitations    Medications:  Prior to Admission medications   Medication Sig Start Date End Date Taking? Authorizing Provider  ACCU-CHEK FASTCLIX LANCETS MISC USE TO CHECK BLOOD SUGAR 3 TIMES A DAY 02/27/18  Yes Renato Shin, MD  ACCU-CHEK GUIDE test strip USE TO CHECK BLOOD SUGAR 3 TIMES PER DAY. Patient taking differently: Use to check blood sugar 4 times per day. 02/02/17  Yes Renato Shin, MD  albuterol (PROVENTIL HFA;VENTOLIN HFA) 108 (90 Base) MCG/ACT inhaler Inhale 2 puffs into the lungs as needed for wheezing or shortness of breath. 02/08/18  Yes Bobbitt, Sedalia Muta, MD  amLODipine (NORVASC) 5 MG tablet Take 5 mg by mouth every morning.    Yes [provider]  aspirin EC 81 MG tablet Take 81 mg by mouth every evening.   Yes [provider]  atorvastatin (LIPITOR) 10 MG tablet Take 10 mg by mouth at bedtime. 12/05/17  Yes [provider]  azelastine (ASTELIN) 0.1 % nasal spray Place 2 sprays into both nostrils 2 (two) times daily. Use in each nostril as directed 02/08/18  Yes Bobbitt, Sedalia Muta, MD  Brexpiprazole (REXULTI) 2 MG TABS Take 2 mg by mouth daily. Patient taking differently: Take 1 mg by mouth daily.  07/10/18  Yes Kathrynn Ducking, MD  buPROPion (WELLBUTRIN XL) 150 MG 24 hr tablet Take 450 mg by mouth every morning.    Yes [provider]  Calcium Carbonate-Vitamin D (CALCIUM 600+D) 600-400 MG-UNIT tablet Take 1 tablet by mouth daily.   Yes [provider]  carbidopa-levodopa (SINEMET IR) 25-100 MG tablet Take 2 tablets  by mouth 3 (three) times daily. 12/27/18  Yes Kathrynn Ducking, MD  famotidine (PEPCID) 20 MG tablet TAKE 1 TABLET (20 MG TOTAL) BY MOUTH 2 (TWO) TIMES DAILY. 05/01/19  Yes Bobbitt, Sedalia Muta, MD  fluticasone (FLOVENT HFA) 220 MCG/ACT inhaler Inhale 2 puffs into the lungs 2 (two) times daily.   Yes [provider]  furosemide (LASIX) 20 MG tablet Take 20 mg by mouth every other day.   Yes [provider]  gabapentin (NEURONTIN) 300 MG capsule  Take 300 mg by mouth 2 (two) times daily.    Yes [provider]  HUMALOG KWIKPEN 100 UNIT/ML KwikPen Inject 17 Units into the skin 3 (three) times daily.  06/08/18  Yes [provider]  hydrochlorothiazide (HYDRODIURIL) 12.5 MG tablet Take 12.5 mg by mouth daily.   Yes [provider]  insulin glargine (LANTUS) 100 UNIT/ML injection Inject 44 Units into the skin every morning.    Yes [provider]  levocetirizine (XYZAL) 5 MG tablet Take 1 tablet (5 mg total) by mouth every evening. 02/07/19  Yes Bobbitt, Sedalia Muta, MD  LORazepam (ATIVAN) 1 MG tablet Take 2 mg by mouth 3 (three) times daily.    Yes Ricard Dillon, MD  losartan (COZAAR) 100 MG tablet Take 100 mg by mouth daily.   Yes [provider]  mirabegron ER (MYRBETRIQ) 50 MG TB24 tablet Take 50 mg by mouth daily.   Yes [provider]  montelukast (SINGULAIR) 10 MG tablet TAKE 1 TABLET (10 MG TOTAL) BY MOUTH AT BEDTIME. 02/07/19  Yes Bobbitt, Sedalia Muta, MD  Olopatadine HCl (PATADAY) 0.2 % SOLN Place 1 drop into both eyes daily as needed. 02/07/19  Yes Bobbitt, Sedalia Muta, MD  pantoprazole (PROTONIX) 40 MG tablet Take 40 mg by mouth 2 (two) times daily.    Yes [provider]  propranolol (INDERAL) 40 MG tablet TAKE 1 TABLET (40 MG TOTAL) BY MOUTH 2 (TWO) TIMES DAILY. 11/12/18  Yes Simmons, Brittainy M, PA-C  rOPINIRole (REQUIP) 2 MG tablet Take 2 mg by mouth 2 (two) times daily. 03/16/15  Yes [provider]  valACYclovir (VALTREX) 1000 MG tablet Take 500 mg by mouth daily.    Yes [provider]  vitamin C (ASCORBIC ACID) 500 MG tablet Take 500 mg by mouth daily.   Yes [provider]  Vitamin D, Ergocalciferol, 2000 units CAPS Take 1 capsule by mouth daily. 2000 units daily.   Yes [provider]  vortioxetine HBr (TRINTELLIX) 20 MG TABS Take 20 mg by mouth daily.    Yes [provider]  zolpidem (AMBIEN) 5 MG tablet Take 5 mg by mouth at bedtime.    Yes [provider]    ROS:  Out of a complete 14 system review of symptoms, the patient complains only of the following symptoms, and all other reviewed systems are negative.  Memory problems Restless legs Walking difficulty Fatigue  Blood pressure 133/76, pulse 74, temperature (!) 96.9 F (36.1 C), height 5\' 6"  (1.676 m), weight 242 lb (109.8 kg).  Physical Exam  General: The patient is alert and cooperative at the time of the examination.  The patient is moderately obese.  Skin: No significant peripheral edema is noted.   Neurologic Exam  Mental status: The patient is alert and oriented x 3 at the time of the examination. The patient has apparent normal recent and remote memory, with an apparently normal attention span and concentration ability.   Cranial nerves: Facial symmetry is present. Speech is normal, no aphasia or dysarthria is noted. Extraocular movements are full. Visual fields are full.  Slight masking of the face is seen.  A jaw tremor is seen off and on.  Motor: The patient has good strength in all 4 extremities.  Sensory examination: Soft touch sensation is symmetric on the face, arms, and legs.  Coordination: The patient has good finger-nose-finger and heel-to-shin bilaterally.  Gait and station: The patient has the ability to arise from a seated position  with arms crossed with some difficulty.  Once up, the patient walk independently, she has significant  decreased arm swing on the right.  No tremors seen.  Tandem gait is unsteady.  Romberg is negative.  Reflexes: Deep tendon reflexes are symmetric.   Assessment/Plan:  1.  Parkinson's disease  2.  Mild gait disorder  3.  Memory disorder  4.  Sleep apnea on CPAP  5.  Restless leg syndrome  The patient will go up on the Sinemet taking the 25/250 mg tablets.  A prescription was sent in.  She will take 1 tablet 3 times daily.  She is encouraged to be physically active.  She will follow-up here in 5 or 6 months.   Greater than 50% of the visit was spent in counseling and coordination of care.  Face-to-face time with the patient was 25 minutes.   Jill Alexanders MD 05/07/2019 11:38 AM  [facility] [address] [place], [place] [address]  Phone [phone] Fax [phone]

## 2019-05-08 MED FILL — ATORVASTATIN 10 MG TABLET: 10 | 90 days supply | Qty: 90 | Fill #1

## 2019-05-09 MED FILL — GABAPENTIN 300 MG CAPSULE: 300 | 90 days supply | Qty: 180 | Fill #0

## 2019-05-12 ENCOUNTER — Other Ambulatory Visit: Payer: Self-pay

## 2019-05-12 ENCOUNTER — Ambulatory Visit (HOSPITAL_COMMUNITY)
Admission: EM | Admit: 2019-05-12 | Discharge: 2019-05-12 | Disposition: A | Discharge status: Home / Self Care | Payer: 59 | Attending: Emergency Medicine | Admitting: Emergency Medicine

## 2019-05-12 ENCOUNTER — Encounter (HOSPITAL_COMMUNITY): Payer: Self-pay

## 2019-05-12 DIAGNOSIS — N390 Urinary tract infection, site not specified: Secondary | ICD-10-CM | POA: Insufficient documentation

## 2019-05-12 LAB — POCT URINALYSIS DIP (DEVICE)
Bilirubin Urine: NEGATIVE
Glucose, UA: NEGATIVE mg/dL
Hgb urine dipstick: NEGATIVE
Ketones, ur: NEGATIVE mg/dL
Nitrite: NEGATIVE
Protein, ur: NEGATIVE mg/dL
Specific Gravity, Urine: 1.01 (ref 1.005–1.030)
Urobilinogen, UA: 0.2 mg/dL (ref 0.0–1.0)
pH: 6.5 (ref 5.0–8.0)

## 2019-05-12 MED ORDER — NITROFURANTOIN MONOHYD MACRO 100 MG PO CAPS: 100.0000 mg | ORAL_CAPSULE | Freq: Two times a day (BID) | ORAL | 0 refills | Status: DC

## 2019-05-12 NOTE — ED Triage Notes (Signed)
Pt present left side pain, symptoms started today. Pt has a history of chronic kidney disease.

## 2019-05-12 NOTE — ED Provider Notes (Addendum)
[place]    CSN: [id number] Arrival date & time: 05/12/19  1734      History   Chief Complaint Chief Complaint  Patient presents with  . Flank Pain    HPI Deanna Rowe is a 64 y.o. female.   HPI  Patient with a history of Type 2 diabetes, CKD 3 and urge incontinence presents for evaluation of left flank pain and urine frequency. Symptoms start late yesterday, has gradually worsened today. No fever, chills, or nausea or abdominal pain. No hx of renal stone.. Reports she had had previous UTI although can't recall last antibiotic used to treat infections.  Multiple medication allergies. Allergies reviewed and confirmed with patient.  Past Medical History:  Diagnosis Date  . Anemia   . Anxiety   . Arthritis    L HIP  . Asthma   . Avascular necrosis of hip (HCC)    LEFT  . Back pain   . Chest pain   . Constipation   . Depression   . Diabetes mellitus   . Diabetes mellitus, type II ([place])   . Dry cough   . Edema, lower extremity   . GERD (gastroesophageal reflux disease)   . High cholesterol   . Hypertension   . Insomnia    takes Ambien nightly  . Joint pain   . Kidney disease   . Kidney disease   . Obesity   . Parkinsonism ([place]) 03/02/2017  . PONV (postoperative nausea and vomiting)   . RLS (restless legs syndrome) 01/15/2018  . Sleep apnea   . Swallowing difficulty   . Thyroid disease   . TIA (transient ischemic attack) 2012   no residual problems  . Urgency incontinence   . Vitamin D deficiency     Patient Active Problem List   Diagnosis Date Noted  . Allergic conjunctivitis 02/07/2019  . Chronic intermittent hypoxia with obstructive sleep apnea 01/25/2019  . Severe obstructive sleep apnea 01/25/2019  . Excessive daytime sleepiness 01/03/2019  . Non-restorative sleep 01/03/2019  . Nocturia more than twice per night 01/03/2019  . Weight gain, abnormal 01/03/2019  . [place] 01/03/2019  . Encounter for routine screening for  malformation using ultrasonics 02/08/2018  . Abnormal weight gain 02/08/2018  . Acid indigestion 02/08/2018  . Acute antritis 02/08/2018  . Adiposity 02/08/2018  . Perennial allergic rhinitis 02/08/2018  . Antiphospholipid syndrome ([place]) 02/08/2018  . Arthralgia of hip or thigh 02/08/2018  . Asthma, cough variant 02/08/2018  . Avitaminosis D 02/08/2018  . Backhand tennis elbow 02/08/2018  . Cannot sleep 02/08/2018  . Chronic kidney disease (CKD), stage III (moderate) 02/08/2018  . Current drug use 02/08/2018  . Depression, major, single episode, in partial remission ([place]) 02/08/2018  . Diabetes mellitus with polyneuropathy ([place]) 02/08/2018  . Difficulty hearing 02/08/2018  . Disturbance of skin sensation 02/08/2018  . Essential (primary) hypertension 02/08/2018  . Extremity pain 02/08/2018  . Facet syndrome, lumbar 02/08/2018  . Factor V Leiden ([place]) 02/08/2018  . Fungal infection of nail 02/08/2018  . Gastroenteritis and colitis, viral 02/08/2018  . Hypoglycemia 02/08/2018  . Menopause 02/08/2018  . Other long term (current) drug therapy 02/08/2018  . Other osteonecrosis, unspecified femur ([place]) 02/08/2018  . Pure hypercholesterolemia 02/08/2018  . Recurrent major depressive episodes ([place]) 02/08/2018  . Syncope and collapse 02/08/2018  . Moderate persistent asthma 02/08/2018  . Cough, persistent 02/08/2018  . Restless leg syndrome 01/15/2018  . Degeneration of lumbar intervertebral disc 11/21/2017  . Tremor of right hand  03/02/2017  . Parkinson's disease ([place]) 03/02/2017  . UTI (urinary tract infection) 12/01/2015  . Controlled type 2 diabetes mellitus without complication ([place]) 123456  . Fast heart beat 04/09/2015  . 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 2 diabetes mellitus ([place]) 04/24/2014  . Postoperative anemia due to acute blood loss 10/10/2013  . Avascular necrosis of bone of left hip ([place]) 10/09/2013  . Arthritis of pelvic region, degenerative 10/09/2013  . Breath shortness  06/18/2013  . Diabetes mellitus type 2, uncontrolled ([place]) 05/31/2013  . Intermittent claudication ([place]) 04/04/2013  . Anxiety   . Acid reflux 09/12/2012  . Clinical depression 02/21/2012  . Stroke ([place]) 02/11/2012  . History of revision of total replacement of right hip joint 04/04/2009    Past Surgical History:  Procedure Laterality Date  . DILATION AND CURETTAGE OF UTERUS    . ESOPHAGEAL MANOMETRY N/A 09/03/2012   Procedure: ESOPHAGEAL MANOMETRY (EM);  Surgeon: Garlan Fair, MD;  [facility];  Service: Endoscopy;  Laterality: N/A;  . ESOPHAGEAL MANOMETRY N/A 06/19/2017   Procedure: ESOPHAGEAL MANOMETRY (EM);  Surgeon: Clarene Essex, MD;  [facility];  Service: Endoscopy;  Laterality: N/A;  . EXPLORATORY LAPAROTOMY    . GANGLION CYST EXCISION    . JOINT REPLACEMENT  2011   rt total hip  . KNEE ARTHROSCOPY    . PILONIDAL CYST / SINUS EXCISION    . TOTAL HIP ARTHROPLASTY Left 10/09/2013   Procedure: LEFT TOTAL HIP ARTHROPLASTY ANTERIOR APPROACH;  Surgeon: [name], MD;  Location: [facility];  Service: Orthopedics;  Laterality: Left;    OB History    Gravida  2   Para  2   Term      Preterm      AB      Living        SAB      TAB      Ectopic      Multiple      Live Births               Home Medications    Prior to Admission medications   Medication Sig Start Date End Date Taking? Authorizing Provider  ACCU-CHEK FASTCLIX LANCETS MISC USE TO CHECK BLOOD SUGAR 3 TIMES A DAY 02/27/18   Renato Shin, MD  ACCU-CHEK GUIDE test strip USE TO CHECK BLOOD SUGAR 3 TIMES PER DAY. Patient taking differently: Use to check blood sugar 4 times per day. 02/02/17   Renato Shin, MD  albuterol (PROVENTIL HFA;VENTOLIN HFA) 108 (90 Base) MCG/ACT inhaler Inhale 2 puffs into the lungs as needed for wheezing or shortness of breath. 02/08/18   Bobbitt, Sedalia Muta, MD  amLODipine (NORVASC) 5 MG tablet Take 5 mg by mouth every morning.     [provider]  aspirin EC 81 MG tablet Take 81 mg by mouth every evening.    [provider]  atorvastatin (LIPITOR) 10 MG tablet Take 10 mg by mouth at bedtime. 12/05/17   [provider]  azelastine (ASTELIN) 0.1 % nasal spray Place 2 sprays into both nostrils 2 (two) times daily. Use in each nostril as directed 02/08/18   Bobbitt, Sedalia Muta, MD  Brexpiprazole (REXULTI) 2 MG TABS Take 2 mg by mouth daily. Patient taking differently: Take 1 mg by mouth daily.  07/10/18   Kathrynn Ducking, MD  buPROPion (WELLBUTRIN XL) 150 MG 24 hr tablet Take 450 mg by mouth every morning.     [provider]  Calcium Carbonate-Vitamin  D (CALCIUM 600+D) 600-400 MG-UNIT tablet Take 1 tablet by mouth daily.    [provider]  carbidopa-levodopa (SINEMET) 25-250 MG tablet Take 1 tablet by mouth 3 (three) times daily. 05/07/19   Kathrynn Ducking, MD  famotidine (PEPCID) 20 MG tablet TAKE 1 TABLET (20 MG TOTAL) BY MOUTH 2 (TWO) TIMES DAILY. 05/01/19   Bobbitt, Sedalia Muta, MD  fluticasone (FLOVENT HFA) 220 MCG/ACT inhaler Inhale 2 puffs into the lungs 2 (two) times daily.    [provider]  furosemide (LASIX) 20 MG tablet Take 20 mg by mouth every other day.    [provider]  gabapentin (NEURONTIN) 300 MG capsule Take 300 mg by mouth 2 (two) times daily.     [provider]  HUMALOG KWIKPEN 100 UNIT/ML KwikPen Inject 17 Units into the skin 3 (three) times daily.  06/08/18   [provider]  hydrochlorothiazide (HYDRODIURIL) 12.5 MG tablet Take 12.5 mg by mouth daily.    [provider]  insulin glargine (LANTUS) 100 UNIT/ML injection Inject 44 Units into the skin every morning.     [provider]  levocetirizine (XYZAL) 5 MG tablet Take 1 tablet (5 mg total) by mouth every evening. 02/07/19   Bobbitt, Sedalia Muta, MD  LORazepam (ATIVAN) 1 MG tablet Take 2 mg by mouth 3 (three) times daily.     Ricard Dillon, MD    losartan (COZAAR) 100 MG tablet Take 100 mg by mouth daily.    [provider]  mirabegron ER (MYRBETRIQ) 50 MG TB24 tablet Take 50 mg by mouth daily.    [provider]  montelukast (SINGULAIR) 10 MG tablet TAKE 1 TABLET (10 MG TOTAL) BY MOUTH AT BEDTIME. 02/07/19   Bobbitt, Sedalia Muta, MD  Olopatadine HCl (PATADAY) 0.2 % SOLN Place 1 drop into both eyes daily as needed. 02/07/19   Bobbitt, Sedalia Muta, MD  pantoprazole (PROTONIX) 40 MG tablet Take 40 mg by mouth 2 (two) times daily.     [provider]  propranolol (INDERAL) 40 MG tablet TAKE 1 TABLET (40 MG TOTAL) BY MOUTH 2 (TWO) TIMES DAILY. 11/12/18   Lyda Jester M, PA-C  rOPINIRole (REQUIP) 2 MG tablet Take 2 mg by mouth 2 (two) times daily. 03/16/15   [provider]  valACYclovir (VALTREX) 1000 MG tablet Take 500 mg by mouth daily.     [provider]  vitamin C (ASCORBIC ACID) 500 MG tablet Take 500 mg by mouth daily.    [provider]  Vitamin D, Ergocalciferol, 2000 units CAPS Take 1 capsule by mouth daily. 2000 units daily.    [provider]  vortioxetine HBr (TRINTELLIX) 20 MG TABS Take 20 mg by mouth daily.     [provider]  zolpidem (AMBIEN) 5 MG tablet Take 5 mg by mouth at bedtime.     [provider]    Family History Family History  Problem Relation Age of Onset  . Alcohol abuse Brother   . Alcohol abuse Brother   . Alcohol abuse Brother   . Alcohol abuse Brother   . High blood pressure Mother   . Hyperlipidemia Mother   . Heart disease Mother   . Heart disease Father   . Alcoholism Father   . Allergic rhinitis Sister   . Diabetes Neg Hx   . Angioedema Neg Hx   . Asthma Neg Hx   . Eczema Neg Hx   . Immunodeficiency Neg Hx   . Urticaria Neg  Hx   . Breast cancer Neg Hx     Social History Social History   Tobacco Use  . Smoking status: Never Smoker  . Smokeless tobacco: Never Used  Substance Use Topics  . Alcohol  use: No    Alcohol/week: 0.0 standard drinks  . Drug use: No     Allergies   Fluticasone-salmeterol, Codeine, Fetzima [levomilnacipran], Nsaids, Trulicity [dulaglutide], Xanax [alprazolam], Amoxicillin, and Pseudoephedrine hcl er   Review of Systems Review of Systems Pertinent negatives listed in HPI  Physical Exam Triage Vital Signs ED Triage Vitals  Enc Vitals Group     BP 05/12/19 1757 (!) 150/75     Pulse Rate 05/12/19 1757 72     Resp 05/12/19 1757 18     Temp 05/12/19 1757 98.3 F (36.8 C)     Temp Source 05/12/19 1757 Oral     SpO2 05/12/19 1757 98 %     Weight --      Height --      Head Circumference --      Peak Flow --      Pain Score 05/12/19 1756 10     Pain Loc --      Pain Edu? --      Excl. in [place]? --    No data found.  Updated Vital Signs BP (!) 150/75 (BP Location: Right Arm)   Pulse 72   Temp 98.3 F (36.8 C) (Oral)   Resp 18   SpO2 98%   Visual Acuity Right Eye Distance:   Left Eye Distance:   Bilateral Distance:    Right Eye Near:   Left Eye Near:    Bilateral Near:     Physical Exam Constitutional:      Appearance: She is obese. She is not ill-appearing.  HENT:     Head: Normocephalic.  Cardiovascular:     Rate and Rhythm: Normal rate and regular rhythm.  Pulmonary:     Effort: Pulmonary effort is normal.     Breath sounds: Normal breath sounds.  Abdominal:     Tenderness: There is left CVA tenderness.  Musculoskeletal:     Cervical back: Normal range of motion.  Neurological:     General: No focal deficit present.  Psychiatric:        Attention and Perception: Attention normal.        Mood and Affect: Mood is anxious.      UC Treatments / Results  Labs (all labs ordered are listed, but only abnormal results are displayed) Labs Reviewed  POCT URINALYSIS DIP (DEVICE) - Abnormal; Notable for the following components:      Result Value   Leukocytes,Ua TRACE (*)    All other components within normal limits     EKG   Radiology No results found.  Procedures Procedures (including critical care time)  Medications Ordered in UC Medications - No data to display  Initial Impression / Assessment and Plan / UC Course  I have reviewed the triage vital signs and the nursing notes.  Pertinent labs & imaging results that were available during my care of the patient were reviewed by me and considered in my medical decision making (see chart for details).    Lower urinary tract infectious disease Urine culture pending. Vitals signs reassuring  Trace leuks present only. Will trial Macrobid 100 mg twice daily x 10 days. Final Clinical Impressions(s) / UC Diagnoses   Final diagnoses:  Lower urinary tract infectious disease   Discharge Instructions  None    ED Prescriptions    Medication Sig Dispense Auth. Provider   nitrofurantoin, macrocrystal-monohydrate, (MACROBID) 100 MG capsule Take 1 capsule (100 mg total) by mouth 2 (two) times daily. 20 capsule Scot Jun, FNP     PDMP not reviewed this encounter.   Scot Jun, FNP 05/14/19 0559    Scot Jun, FNP 05/14/19 [id number]

## 2019-05-13 LAB — URINE CULTURE
Culture: <10000 — AB
Special Requests: NORMAL

## 2019-05-14 DIAGNOSIS — G4733 Obstructive sleep apnea (adult) (pediatric): Secondary | ICD-10-CM | POA: Diagnosis not present

## 2019-05-15 ENCOUNTER — Encounter (INDEPENDENT_AMBULATORY_CARE_PROVIDER_SITE_OTHER): Payer: Self-pay | Admitting: Family Medicine

## 2019-05-15 ENCOUNTER — Ambulatory Visit (INDEPENDENT_AMBULATORY_CARE_PROVIDER_SITE_OTHER): Payer: 59 | Admitting: Family Medicine

## 2019-05-15 ENCOUNTER — Other Ambulatory Visit: Payer: Self-pay

## 2019-05-15 VITALS — BP 117/63 | HR 66 | Temp 98.2°F | Ht 66.0 in | Wt 237.0 lb

## 2019-05-15 DIAGNOSIS — Z6838 Body mass index (BMI) 38.0-38.9, adult: Secondary | ICD-10-CM

## 2019-05-15 DIAGNOSIS — E1165 Type 2 diabetes mellitus with hyperglycemia: Secondary | ICD-10-CM

## 2019-05-15 DIAGNOSIS — Z794 Long term (current) use of insulin: Secondary | ICD-10-CM

## 2019-05-15 NOTE — Progress Notes (Signed)
Chief Complaint:   OBESITY Deanna Rowe is here to discuss her progress with her obesity treatment plan along with follow-up of her obesity related diagnoses. Deanna Rowe is on the Category 3 Plan with breakfast options and states she is following her eating plan approximately 75% of the time. Deanna Rowe states she is doing 0 minutes 0 times per week.  Today's visit was #: 3 Starting weight: 237 lbs Starting date: 04/16/2019 Today's weight: 237 lbs Today's date: 05/15/2019 Total lbs lost to date: 0 Total lbs lost since last in-office visit: 0  Interim History: Deanna Rowe reports being off the plan because she has not had access to her refrigerator due to construction. She ate out several times. She is now back on the plan. She likes the food on the plan. She is eating all of the protein on the plan. She has cut out skipping meals.  Subjective:   1. Type 2 diabetes mellitus with hyperglycemia, without long-term current use of insulin (HCC) DM is not well controlled. Last A1c was 7.9. Her fasting BGs range between 97 and 144, and 2 hour post prandials range between 78 and 159. She has occasional hypoglycemia when she skips meals. She has not yet brought the glucose tablets discussed at last OV. She is taking Humalog TID and Lantus. Lab Results  Component Value Date   HGBA1C 7.9 (H) 04/16/2019    Assessment/Plan:   1. Type 2 diabetes mellitus with hyperglycemia, without long-term current use of insulin (HCC)  Deanna Rowe will buy glucose tablets for hypoglycemic episodes. She will follow up with Endocrinology in 2 and 1/2 months as scheduled. No skipping meals.  2. Class 2 severe obesity with serious comorbidity and body mass index (BMI) of 38.0 to 38.9 in adult, unspecified obesity type (HCC) Deanna Rowe is currently in the action stage of change. As such, her goal is to continue with weight loss efforts. She has agreed to the Category 3 Plan.   Exercise goals: No exercise has been prescribed at this  time.  Behavioral modification strategies: no skipping meals and planning for success.  Deanna Rowe has agreed to follow-up with our clinic in 2 weeks. She was informed of the importance of frequent follow-up visits to maximize her success with intensive lifestyle modifications for her multiple health conditions.   Objective:   Blood pressure 117/63, pulse 66, temperature 98.2 F (36.8 C), temperature source Oral, height 5\' 6"  (1.676 m), weight 237 lb (107.5 kg), SpO2 97 %. Body mass index is 38.25 kg/m.  General: Cooperative, alert, well developed, in no acute distress. HEENT: Conjunctivae and lids unremarkable. Cardiovascular: Regular rhythm.  Lungs: Normal work of breathing. Neurologic: No focal deficits.   Lab Results  Component Value Date   CREATININE 1.10 (H) 04/16/2019   BUN 11 04/16/2019   NA 142 04/16/2019   K 4.5 04/16/2019   CL 103 04/16/2019   CO2 27 04/16/2019   Lab Results  Component Value Date   ALT 21 04/16/2019   AST 23 04/16/2019   ALKPHOS 58 04/16/2019   BILITOT 0.3 04/16/2019   Lab Results  Component Value Date   HGBA1C 7.9 (H) 04/16/2019   HGBA1C 6.9 11/25/2016   HGBA1C 6.0 06/10/2016   HGBA1C 5.9 02/12/2016   HGBA1C 6.1 12/01/2015   Lab Results  Component Value Date   INSULIN 12.3 04/16/2019   Lab Results  Component Value Date   TSH 1.780 04/16/2019   Lab Results  Component Value Date   CHOL 155 04/16/2019   HDL  84 04/16/2019   LDLCALC 57 04/16/2019   TRIG 71 04/16/2019   Lab Results  Component Value Date   WBC 6.8 04/16/2019   HGB 12.3 04/16/2019   HCT 36.1 04/16/2019   MCV 96 04/16/2019   PLT 274 04/16/2019   No results found for: IRON, TIBC, FERRITIN  Obesity Behavioral Intervention Documentation for Insurance:   Approximately 15 minutes were spent on the discussion below.  ASK: We discussed the diagnosis of obesity with Deanna Rowe today and Deanna Rowe agreed to give [name] permission to discuss obesity behavioral modification  therapy today.  ASSESS: Deanna Rowe has the diagnosis of obesity and her BMI today is 38.27. Deanna Rowe is in the action stage of change.   ADVISE: [name] was educated on the multiple health risks of obesity as well as the benefit of weight loss to improve her health. She was advised of the need for long term treatment and the importance of lifestyle modifications to improve her current health and to decrease her risk of future health problems.  AGREE: Multiple dietary modification options and treatment options were discussed and Deanna Rowe agreed to follow the recommendations documented in the above note.  ARRANGE: Deanna Rowe was educated on the importance of frequent visits to treat obesity as outlined per CMS and USPSTF guidelines and agreed to schedule her next follow up appointment today.  Attestation Statements:   Reviewed by clinician on day of visit: allergies, medications, problem list, medical history, surgical history, family history, social history, and previous encounter notes.   Wilhemena Durie, am acting as Location manager for [employer], FNP-C.  I have reviewed the above documentation for accuracy and completeness, and I agree with the above. -  Georgianne Fick, FNP

## 2019-05-16 MED FILL — ACCU-CHEK FASTCLIX LANCETS: 25 days supply | Qty: 102 | Fill #1

## 2019-05-16 MED FILL — LANTUS SOLOSTAR 100 UNITS/M: 100 | 90 days supply | Qty: 45 | Fill #0

## 2019-05-17 MED FILL — PROPRANOLOL 40 MG TABLET: 40 | 90 days supply | Qty: 180 | Fill #2

## 2019-05-17 MED FILL — rOPINIRole HCL 2 MG TABS: 2 | 90 days supply | Qty: 180 | Fill #1

## 2019-05-17 MED FILL — MYRBETRIQ ER 50 MG TABLET: 50 | 30 days supply | Qty: 30 | Fill #9

## 2019-05-17 MED FILL — ZOLPIDEM TARTRATE 5 MG TAB: 5 | 90 days supply | Qty: 90 | Fill #0

## 2019-05-17 MED FILL — MONTELUKAST SOD 10 MG TAB: 10 | 30 days supply | Qty: 30 | Fill #3

## 2019-05-17 MED FILL — PANTOPRAZOLE SOD DR 40 MG T: 40 | 90 days supply | Qty: 180 | Fill #1

## 2019-05-21 ENCOUNTER — Ambulatory Visit: Payer: Self-pay | Admitting: Adult Health

## 2019-05-29 ENCOUNTER — Ambulatory Visit (INDEPENDENT_AMBULATORY_CARE_PROVIDER_SITE_OTHER): Payer: 59 | Admitting: Family Medicine

## 2019-05-29 ENCOUNTER — Other Ambulatory Visit: Payer: Self-pay

## 2019-05-29 ENCOUNTER — Encounter (INDEPENDENT_AMBULATORY_CARE_PROVIDER_SITE_OTHER): Payer: Self-pay | Admitting: Family Medicine

## 2019-05-29 VITALS — BP 107/65 | HR 61 | Temp 97.9°F | Ht 65.0 in | Wt 232.0 lb

## 2019-05-29 DIAGNOSIS — E1165 Type 2 diabetes mellitus with hyperglycemia: Secondary | ICD-10-CM

## 2019-05-29 DIAGNOSIS — Z794 Long term (current) use of insulin: Secondary | ICD-10-CM | POA: Diagnosis not present

## 2019-05-29 DIAGNOSIS — E559 Vitamin D deficiency, unspecified: Secondary | ICD-10-CM | POA: Diagnosis not present

## 2019-05-29 DIAGNOSIS — Z9189 Other specified personal risk factors, not elsewhere classified: Secondary | ICD-10-CM

## 2019-05-29 DIAGNOSIS — Z6838 Body mass index (BMI) 38.0-38.9, adult: Secondary | ICD-10-CM | POA: Diagnosis not present

## 2019-05-29 DIAGNOSIS — G4733 Obstructive sleep apnea (adult) (pediatric): Secondary | ICD-10-CM | POA: Diagnosis not present

## 2019-05-29 DIAGNOSIS — N1831 Chronic kidney disease, stage 3a: Secondary | ICD-10-CM | POA: Diagnosis not present

## 2019-05-29 NOTE — Progress Notes (Signed)
Chief Complaint:   OBESITY Deanna Rowe is here to discuss her progress with her obesity treatment plan along with follow-up of her obesity related diagnoses. Deanna Rowe is on the Category 3 Plan and states she is following her eating plan approximately 50% of the time. Deanna Rowe states she is exercising for 0 minutes 0 times per week.  Today's visit was #: 4 Starting weight: 237 lbs Starting date: 04/16/2019 Today's weight: 232 lbs Today's date: 05/29/2019 Total lbs lost to date: 5 lbs Total lbs lost since last in-office visit: 5 lbs  Interim History: Deanna Rowe says she has been extra tired lately.    Subjective:   1. Type 2 diabetes mellitus with hyperglycemia, with long-term current use of insulin (HCC) Deanna Rowe is taking Lantus 44 units daily, Humalog 17 units three times daily.  Followed by Dr. Buddy Duty.  Trulicity made her sick.  Lab Results  Component Value Date   HGBA1C 7.9 (H) 04/16/2019   HGBA1C 6.9 11/25/2016   HGBA1C 6.0 06/10/2016   Lab Results  Component Value Date   MICROALBUR <0.7 02/12/2016   LDLCALC 57 04/16/2019   CREATININE 1.10 (H) 04/16/2019   Lab Results  Component Value Date   INSULIN 12.3 04/16/2019   2. Vitamin D deficiency Deanna Rowe's Vitamin D level was 40.9 on 04/16/2019. She is currently taking vit D. She denies nausea, vomiting or muscle weakness.  3. Stage 3a chronic kidney disease Lab Results  Component Value Date   CREATININE 1.10 (H) 04/16/2019   CREATININE 1.58 (H) 10/12/2018   CREATININE 1.50 (H) 09/19/2018   Lab Results  Component Value Date   CREATININE 1.10 (H) 04/16/2019   BUN 11 04/16/2019   NA 142 04/16/2019   K 4.5 04/16/2019   CL 103 04/16/2019   CO2 27 04/16/2019   4. OSA (obstructive sleep apnea) Deanna Rowe has a diagnosis of sleep apnea.  5. At risk for heart disease Deanna Rowe is at a higher than average risk for cardiovascular disease due to obesity. Reviewed: no chest pain on exertion, no dyspnea on exertion, and no swelling of  ankles.  Assessment/Plan:   1. Type 2 diabetes mellitus with hyperglycemia, with long-term current use of insulin (HCC) Good blood sugar control is important to decrease the likelihood of diabetic complications such as nephropathy, neuropathy, limb loss, blindness, coronary artery disease, and death. Intensive lifestyle modification including diet, exercise and weight loss are the first line of treatment for diabetes.   2. Vitamin D deficiency Low Vitamin D level contributes to fatigue and are associated with obesity, breast, and colon cancer. She agrees to continue to take prescription Vitamin D @50 ,000 IU every week and will follow-up for routine testing of Vitamin D, at least 2-3 times per year to avoid over-replacement.  3. Stage 3a chronic kidney disease Lab results and trends reviewed. We discussed several lifestyle modifications today and she will continue to work on diet, exercise and weight loss efforts. Avoid nephrotoxic medications. Orders and follow up as documented in patient record.   Counseling . Chronic kidney disease (CKD) happens when the kidneys are damaged over a long period of time. . Most of the time, this condition does not go away, but it can usually be controlled. Steps must be taken to slow down the kidney damage or to stop it from getting worse. . Intensive lifestyle modifications are the first line treatment for this issue.  Marland Kitchen Avoid buying foods that are: processed, frozen, or prepackaged to avoid excess salt.  4. OSA (obstructive sleep  apnea) Intensive lifestyle modifications are the first line treatment for this issue. We discussed several lifestyle modifications today and she will continue to work on diet, exercise and weight loss efforts. We will continue to monitor. Orders and follow up as documented in patient record.   Counseling  Sleep apnea is a condition in which breathing pauses or becomes shallow during sleep. This happens over and over during the  night. This disrupts your sleep and keeps your body from getting the rest that it needs, which can cause tiredness and lack of energy (fatigue) during the day.  Sleep apnea treatment: If you were given a device to open your airway while you sleep, USE IT!  Sleep hygiene:   Limit or avoid alcohol, caffeinated beverages, and cigarettes, especially close to bedtime.   Do not eat a large meal or eat spicy foods right before bedtime. This can lead to digestive discomfort that can make it hard for you to sleep.  Keep a sleep diary to help you and your health care provider figure out what could be causing your insomnia.  . Make your bedroom a dark, comfortable place where it is easy to fall asleep. ? Put up shades or blackout curtains to block light from outside. ? Use a white noise machine to block noise. ? Keep the temperature cool. . Limit screen use before bedtime. This includes: ? Watching TV. ? Using your smartphone, tablet, or computer. . Stick to a routine that includes going to bed and waking up at the same times every day and night. This can help you fall asleep faster. Consider making a quiet activity, such as reading, part of your nighttime routine. . Try to avoid taking naps during the day so that you sleep better at night. . Get out of bed if you are still awake after 15 minutes of trying to sleep. Keep the lights down, but try reading or doing a quiet activity. When you feel sleepy, go back to bed.  5. At risk for heart disease Deanna Rowe was given approximately 15 minutes of coronary artery disease prevention counseling today. She is 64 y.o. female and has risk factors for heart disease including obesity. We discussed intensive lifestyle modifications today with an emphasis on specific weight loss instructions and strategies.   Repetitive spaced learning was employed today to elicit superior memory formation and behavioral change.  6. Class 2 severe obesity with serious comorbidity  and body mass index (BMI) of 38.0 to 38.9 in adult, unspecified obesity type (HCC) Deanna Rowe is currently in the action stage of change. As such, her goal is to continue with weight loss efforts. She has agreed to the Category 2 Plan.   Exercise goals: All adults should avoid inactivity. Some physical activity is better than none, and adults who participate in any amount of physical activity gain some health benefits.  Behavioral modification strategies: increasing lean protein intake and increasing vegetables.  Deanna Rowe has agreed to follow-up with our clinic in 2 weeks. She was informed of the importance of frequent follow-up visits to maximize her success with intensive lifestyle modifications for her multiple health conditions.   Objective:   Blood pressure 107/65, pulse 61, temperature 97.9 F (36.6 C), temperature source Oral, height 5\' 5"  (1.651 m), weight 232 lb (105.2 kg), SpO2 98 %. Body mass index is 38.61 kg/m.  General: Cooperative, alert, well developed, in no acute distress. HEENT: Conjunctivae and lids unremarkable. Cardiovascular: Regular rhythm.  Lungs: Normal work of breathing. Neurologic: No focal deficits.  Lab Results  Component Value Date   CREATININE 1.10 (H) 04/16/2019   BUN 11 04/16/2019   NA 142 04/16/2019   K 4.5 04/16/2019   CL 103 04/16/2019   CO2 27 04/16/2019   Lab Results  Component Value Date   ALT 21 04/16/2019   AST 23 04/16/2019   ALKPHOS 58 04/16/2019   BILITOT 0.3 04/16/2019   Lab Results  Component Value Date   HGBA1C 7.9 (H) 04/16/2019   HGBA1C 6.9 11/25/2016   HGBA1C 6.0 06/10/2016   HGBA1C 5.9 02/12/2016   HGBA1C 6.1 12/01/2015   Lab Results  Component Value Date   INSULIN 12.3 04/16/2019   Lab Results  Component Value Date   TSH 1.780 04/16/2019   Lab Results  Component Value Date   CHOL 155 04/16/2019   HDL 84 04/16/2019   LDLCALC 57 04/16/2019   TRIG 71 04/16/2019   Lab Results  Component Value Date   WBC 6.8  04/16/2019   HGB 12.3 04/16/2019   HCT 36.1 04/16/2019   MCV 96 04/16/2019   PLT 274 04/16/2019   Attestation Statements:   Reviewed by clinician on day of visit: allergies, medications, problem list, medical history, surgical history, family history, social history, and previous encounter notes.  I, Water quality scientist, CMA, am acting as Location manager for [employer], DO.  I have reviewed the above documentation for accuracy and completeness, and I agree with the above. Briscoe Deutscher, DO

## 2019-06-03 ENCOUNTER — Encounter: Payer: Self-pay | Admitting: Neurology

## 2019-06-03 MED FILL — FAMOTIDINE 20 MG TABS: 20 | 30 days supply | Qty: 60 | Fill #1

## 2019-06-03 MED FILL — LORazepam 2 MG TABS: 2 | 90 days supply | Qty: 270 | Fill #0

## 2019-06-04 ENCOUNTER — Ambulatory Visit (INDEPENDENT_AMBULATORY_CARE_PROVIDER_SITE_OTHER): Payer: 59 | Admitting: Neurology

## 2019-06-04 ENCOUNTER — Other Ambulatory Visit: Payer: Self-pay

## 2019-06-04 ENCOUNTER — Encounter: Payer: Self-pay | Admitting: Neurology

## 2019-06-04 VITALS — BP 122/74 | HR 67 | Temp 96.9°F | Ht 66.0 in | Wt 233.0 lb

## 2019-06-04 DIAGNOSIS — F324 Major depressive disorder, single episode, in partial remission: Secondary | ICD-10-CM

## 2019-06-04 DIAGNOSIS — Z9989 Dependence on other enabling machines and devices: Secondary | ICD-10-CM

## 2019-06-04 DIAGNOSIS — G4733 Obstructive sleep apnea (adult) (pediatric): Secondary | ICD-10-CM

## 2019-06-04 DIAGNOSIS — J4521 Mild intermittent asthma with (acute) exacerbation: Secondary | ICD-10-CM | POA: Insufficient documentation

## 2019-06-04 DIAGNOSIS — G4734 Idiopathic sleep related nonobstructive alveolar hypoventilation: Secondary | ICD-10-CM | POA: Diagnosis not present

## 2019-06-04 DIAGNOSIS — G20 Parkinson's disease: Secondary | ICD-10-CM

## 2019-06-04 NOTE — Progress Notes (Signed)
SLEEP MEDICINE CLINIC    Provider:  Larey Seat, MD  Primary Care Physician:  Leighton Ruff, [place] [address]     Referring Provider: Leighton Ruff, [place],  [place] [address]          Chief Complaint according to patient   Patient presents with:    . New Patient (Initial Visit)     pt with husband, rm 23. pt states that the machine is causing her to wake up and she has leaks just about every night. she has attempted two different mask. DME [employer].      HISTORY OF PRESENT ILLNESS:  Deanna Rowe is a 64 y.o. year old African American female patient seen on 06/04/2019. I have the pleasure of seeing Deanna Rowe today in the presence of her husband, she underwent a sleep study was expanded EEG montage on 19 October was diagnosed with a rather severe sleep apnea with an apnea hypopnea index of 40.2/h, she did not enter REM sleep in supine position there was more apnea than when she slept on her side of her belly.  The vast majority of her arousals at night were related to respiration.  She did have some periodic limb movements but they did not dominate the night.  This is important as she does have Parkinson's disease and is usually prone to some nocturnal kicking or moving.  Snoring has been noted and also prolonged low oxygen levels.  This prolonged hypoxemia is in regards to the total sleep time with a oxygen saturation at or below 89% and was 55 minutes.  Due to these findings  She was seen in a titration study on 19 February 2019 and did best on the rather low pressure CPAP 6, 7, and 8 cmH2O and she slept 440 minutes without having further low oxygen levels at the 7 cm setting her EKG became normal she had much less leg movements.  Based on these findings we ordered an auto titration and she has been using the machine 28 out of 30 days with a compliance rate of 93% which is excellent.  The average use at time of  all days is 4 hours 49 minutes, the minimum pressure is set for the maximum pressure set at 20 cmH2O and the result but residual AHI is 6.0/h.  It seems that the residual apneas are still obstructive in nature also she has a lots of extra pressure that she could utilize.  The 95th percentile pressure used at night was 9.7 cm.  I am going today to restrict the maximum pressure to 13 cmH2O and to give her 3 cm expiratory pressure relief.  In addition her husband has made me aware that she feels that she does not get enough airflow, she has also changed her nasal pillows or nasal cradle's after just a few weeks of use.  They usually should last a month.  Today's Epworth sleepiness score was endorsed at 15 points and fatigue severity very high at 56 points. She reports air leaks that bother her.   01-03-2019 Chief concern according to patient : " Dr Jannifer Franklin wants me to be seen for sleep apnea"  I have the pleasure of seeing Deanna Rowe today, a right-handed  [place] American female with a possible sleep disorder.  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    Sleep relevant medical history: Nocturia: 2-4 times ( depending on lasix) . No Sleep walking,NO REM BD history - confirmed bu husband- he stated sh snores and has apnea.  She also gained weight over the last 2 years 195 before COVID, 3 times weekly at the gym, now 245 ponds...     Family medical /sleep history: daughter , husband and Cousins with CPAP /OSA.    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 status is married , with 2 adult daughters, 19 and 55, they left the home- no grandchildren.  The patient used to work in shifts( Presenter, broadcasting,) Pets are not present, but her daughters bring their dogs over - she is allergic to dogs (!)> . Tobacco use: never  .  ETOH use - none , Caffeine intake in form of Coffee( none) , no caffeinated Soda-  No Tea . Regular exercise in form of gym- [employer]    Hobbies : " I love to house clean" .    Sleep habits are as follows: The patient's dinner time is between 5.30-6  PM. The patient goes to bed at 9 PM and  Has no trouble to fall asleep. She continues to sleep for only 1- 2 hours, wakes for up to 4  bathroom breaks, the first time at 10 PM, an hour after falling asleep-   She sweats profusely at night, has hot flushes, no palpitations. The bedroom is cool, quiet , dark and shared with her spouse. .  The preferred sleep position is left side , with the support of 3 pillows due to GERD. .  Dreams are reportedly rare.   5.30 AM is the usual rise time pre-GYM- now she sleeps until 7.30 AM. The patient wakes up spontaneously. She reports not feeling refreshed or restored in AM, with symptoms such as dry mouth , morning headaches- not woken by headaches- she feels tired and fatigued.  Naps are taken infrequently, lasting from 1-2 hours and are less refreshing than nocturnal sleep.    Review of Systems: Out of a complete 14 system review, the patient complains of only the following symptoms, and all other reviewed systems are negative.:  Fatigue, sleepiness , no longer snoring.  Less fragmented sleep on CPAP but still has nocturia and diaphoresis.    How likely are you to doze in the following situations: 0 = not likely, 1 = slight chance, 2 = moderate chance, 3 = high chance   Sitting and Reading? Watching Television? Sitting inactive in a  public place (theater or meeting)? As a passenger in a car for an hour without a break? Lying down in the afternoon when circumstances permit? Sitting and talking to someone? Sitting quietly after lunch without alcohol? In a car, while stopped for a few minutes in traffic?   Total = 15 from 17 / 24 points   FSS endorsed at 50/ 63 points.  GDS at 4/ 15 points.   Social History   Socioeconomic History  . Marital status: Married    Spouse name: Simona Huh  . Number of children: Not on file  .  Years of education: Not on file  . Highest education level: Not on file  Occupational History  . Occupation: retired Therapist, sports  Tobacco Use  . Smoking status: Never Smoker  . Smokeless tobacco: Never Used  Substance and Sexual Activity  . Alcohol use: No    Alcohol/week: 0.0 standard drinks  . Drug use: No  . Sexual activity: Not Currently  Other Topics Concern  . Not on file  Social History Narrative   Lives with husband   Caffeine use: none   Right handed    Social Determinants of Health   Financial Resource Strain:   . Difficulty of Paying Living Expenses: Not on file  Food Insecurity:   . Worried About Charity fundraiser in the Last Year: Not on file  . Ran Out of Food in the Last Year: Not on file  Transportation Needs:   . Lack of Transportation (Medical): Not on file  . Lack of Transportation (Non-Medical): Not on file  Physical Activity:   . Days of Exercise per Week: Not on file  . Minutes of Exercise per Session: Not on file  Stress:   . Feeling of Stress : Not on file  Social Connections:   . Frequency of Communication with Friends and Family: Not on file  . Frequency of Social Gatherings with Friends and Family: Not on file  . Attends Religious Services: Not on file  . Active Member of Clubs or Organizations: Not on file  . Attends Archivist Meetings: Not on file  . Marital Status: Not on file    Family History  Problem Relation Age of Onset  . Alcohol  abuse Brother   . Alcohol abuse Brother   . Alcohol abuse Brother   . Alcohol abuse Brother   . High blood pressure Mother   . Hyperlipidemia Mother   . Heart disease Mother   . Heart disease Father   . Alcoholism Father   . Allergic rhinitis Sister   . Diabetes Neg Hx   . Angioedema Neg Hx   . Asthma Neg Hx   . Eczema Neg Hx   . Immunodeficiency Neg Hx   . Urticaria Neg Hx   . Breast cancer Neg Hx     Past Medical History:  Diagnosis Date  . Anemia   . Anxiety   . Arthritis    L HIP  . Asthma   . Avascular necrosis of hip ([facility])    LEFT  . Back pain   . Chest pain   . Constipation   . Depression   . Diabetes mellitus   . Diabetes mellitus, type II ([facility])   . Dry cough   . Edema, lower extremity   . GERD (gastroesophageal reflux disease)   . High cholesterol   . Hypertension   . Insomnia    takes Ambien nightly  . Joint pain   . Kidney disease   . Kidney disease   . Obesity   . Parkinsonism ([facility]) 03/02/2017  . PONV (postoperative nausea and vomiting)   . RLS (restless legs syndrome) 01/15/2018  . Sleep apnea   . Swallowing difficulty   . Thyroid disease   . TIA (transient ischemic attack) 2012   no residual problems  . Urgency incontinence   . Vitamin D deficiency     Past Surgical History:  Procedure Laterality Date  . DILATION AND CURETTAGE OF UTERUS    . ESOPHAGEAL MANOMETRY N/A 09/03/2012   Procedure: ESOPHAGEAL MANOMETRY (EM);  Surgeon: Garlan Fair, MD;  Location: [facility];  Service: Endoscopy;  Laterality: N/A;  . ESOPHAGEAL MANOMETRY N/A 06/19/2017   Procedure: ESOPHAGEAL MANOMETRY (EM);  Surgeon: Clarene Essex, MD;  Location: [facility];  Service: Endoscopy;  Laterality: N/A;  . EXPLORATORY LAPAROTOMY    . GANGLION CYST EXCISION    . JOINT REPLACEMENT  2011   rt total hip  . KNEE ARTHROSCOPY    . PILONIDAL CYST / SINUS EXCISION    . TOTAL HIP ARTHROPLASTY Left 10/09/2013   Procedure: LEFT TOTAL HIP ARTHROPLASTY ANTERIOR APPROACH;   Surgeon: [name], MD;  Location: [facility];  Service: Orthopedics;  Laterality: Left;     Current Outpatient Medications on File Prior to Visit  Medication Sig Dispense Refill  . ACCU-CHEK FASTCLIX LANCETS MISC USE TO CHECK BLOOD SUGAR 3 TIMES A DAY 102 each 0  . ACCU-CHEK GUIDE test strip USE TO CHECK BLOOD SUGAR 3 TIMES PER DAY. (Patient taking differently: Use to check blood sugar 4 times per day.) 100 each 2  . albuterol (PROVENTIL HFA;VENTOLIN HFA) 108 (90 Base) MCG/ACT inhaler Inhale 2 puffs into the lungs as needed for wheezing or shortness of breath. 18 g 2  . amLODipine (NORVASC) 5 MG tablet Take 5 mg by mouth every morning.     Marland Kitchen aspirin EC 81 MG tablet Take 81 mg by mouth every evening.    Marland Kitchen atorvastatin (LIPITOR) 10 MG tablet Take 10 mg by mouth at bedtime.  4  . azelastine (ASTELIN) 0.1 % nasal spray Place 2 sprays into both nostrils 2 (two) times daily. Use in each nostril as directed 30 mL 5  . Brexpiprazole (REXULTI) 2 MG TABS Take 2 mg by mouth daily. (Patient taking differently: Take 1 mg by mouth daily. ) 30 tablet   . buPROPion (WELLBUTRIN XL) 150 MG 24 hr tablet Take 450 mg by mouth every morning.     . Calcium Carbonate-Vitamin D (CALCIUM 600+D) 600-400 MG-UNIT tablet Take 1 tablet by mouth daily.    . carbidopa-levodopa (SINEMET) 25-250 MG tablet Take 1 tablet by mouth 3 (three) times daily. 270 tablet 3  . famotidine (PEPCID) 20 MG tablet TAKE 1 TABLET (20 MG TOTAL) BY MOUTH 2 (TWO) TIMES DAILY. 60 tablet 2  . fluticasone (FLOVENT HFA) 220 MCG/ACT inhaler Inhale 2 puffs into the lungs 2 (two) times daily.    . furosemide (LASIX) 20 MG tablet Take 20 mg by mouth every other day.    . gabapentin (NEURONTIN) 300 MG capsule Take 300 mg by mouth 2 (two) times daily.     Marland Kitchen HUMALOG KWIKPEN 100 UNIT/ML KwikPen Inject 17 Units into the skin 3 (three) times daily.     . hydrochlorothiazide (HYDRODIURIL) 12.5 MG tablet Take 12.5 mg by mouth daily.    . insulin glargine  (LANTUS) 100 UNIT/ML injection Inject 44 Units into the skin every morning.     Marland Kitchen levocetirizine (XYZAL) 5 MG tablet Take 1 tablet (5 mg total) by mouth every evening. 30 tablet 5  . LORazepam (ATIVAN) 1 MG tablet Take 2 mg by mouth 3 (three) times daily.     Marland Kitchen losartan (COZAAR) 100 MG tablet Take 100 mg by mouth daily.    . mirabegron ER (MYRBETRIQ) 50 MG TB24 tablet Take 50 mg by mouth daily.    . montelukast (SINGULAIR) 10 MG tablet TAKE 1 TABLET (10 MG TOTAL) BY MOUTH AT BEDTIME. 30 tablet 5  . Olopatadine HCl (PATADAY) 0.2 % SOLN Place 1  drop into both eyes daily as needed. 2.5 mL 5  . pantoprazole (PROTONIX) 40 MG tablet Take 40 mg by mouth 2 (two) times daily.     . propranolol (INDERAL) 40 MG tablet TAKE 1 TABLET (40 MG TOTAL) BY MOUTH 2 (TWO) TIMES DAILY. 180 tablet 2  . rOPINIRole (REQUIP) 2 MG tablet Take 2 mg by mouth 2 (two) times daily.  3  . valACYclovir (VALTREX) 1000 MG tablet Take 500 mg by mouth daily.     . vitamin C (ASCORBIC ACID) 500 MG tablet Take 500 mg by mouth daily.    . Vitamin D, Ergocalciferol, 2000 units CAPS Take 1 capsule by mouth daily. 2000 units daily.    Marland Kitchen vortioxetine HBr (TRINTELLIX) 20 MG TABS Take 20 mg by mouth daily.     Marland Kitchen zolpidem (AMBIEN) 5 MG tablet Take 5 mg by mouth at bedtime.      Current Facility-Administered Medications on File Prior to Visit  Medication Dose Route Frequency Provider Last Rate Last Admin  . tranexamic acid (CYKLOKAPRON) topical -INTRAOP  2,000 mg Topical Once Cecilio Asper, [employer], PA-C        Allergies  Allergen Reactions  . Fluticasone-Salmeterol Anaphylaxis  . Codeine Nausea And Vomiting  . Fetzima [Levomilnacipran] Nausea And Vomiting  . Nsaids Other (See Comments)    Upset stomach   . Trulicity [Dulaglutide] Nausea And Vomiting    Significant and undesirably rapid (40 lbs) weight loss   . Xanax [Alprazolam] Other (See Comments)    disoriented  . Amoxicillin Rash    Has patient had a PCN reaction causing immediate  rash, facial/tongue/throat swelling, SOB or lightheadedness with hypotension: Yes Has patient had a PCN reaction causing severe rash involving mucus membranes or skin necrosis: No Has patient had a PCN reaction that required hospitalization: No Has patient had a PCN reaction occurring within the last 10 years: No If all of the above answers are "NO", then may proceed with Cephalosporin use.   . Pseudoephedrine Hcl Er Palpitations   review of medication showed Ambien and Ativan to be present, this may be still a case of insomnia , but it is sufficiently treated and closely linked to her anxiety/ depression.   Physical exam:  Today's Vitals   06/04/19 1335  BP: 122/74  Pulse: 67  Temp: (!) 96.9 F (36.1 C)  Weight: 233 lb (105.7 kg)  Height: 5\' 6"  (1.676 m)   Body mass index is 37.61 kg/m.   Wt Readings from Last 3 Encounters:  06/04/19 233 lb (105.7 kg)  05/29/19 232 lb (105.2 kg)  05/15/19 237 lb (107.5 kg)     Ht Readings from Last 3 Encounters:  06/04/19 5\' 6"  (1.676 m)  05/29/19 5\' 5"  (1.651 m)  05/15/19 5\' 6"  (1.676 m)      General: The patient is awake, alert and appears not in acute distress. Her voice is monotonous, her face masked. Her posture slightly stooped. The patient is well groomed. Head: Normocephalic, atraumatic. Neck is supple. Mallampati : 4,  neck circumference:17 inches . Nasal airflow patent.  Retrognathia is seen.  Dental status:  Cardiovascular:  Regular rate and cardiac rhythm by pulse,  without distended neck veins. Respiratory: Lungs are clear to auscultation.  Skin:  Without evidence of ankle edema, or rash. Trunk: The patient's posture is erect.   Neurologic exam : no loss of taste or smell-  The patient is awake and alert, oriented to place and time.   Memory subjective described as intact.  Attention span & concentration ability appears normal.  Speech is fluent,  without  dysarthria, dysphonia or aphasia.  Mood and affect are subdued-  low volume voice but not hoarse-  Dysphonic, loss of modulation.    Cranial nerves: no loss of smell or taste reported  Pupils are equal and briskly reactive to light. Funduscopic exam deferred.  Extraocular movements in vertical and horizontal planes were slow-  but without nystagmus.  No Diplopia. Bilateral ptosis,  Visual fields by finger perimetry are intact to peripheral vision. Hearing was intact to soft voice and finger rubbing.    Facial sensation intact to fine touch.  Facial motor strength is symmetric( a she has lost her facial expression, there is no blinking) and tongue and uvula move midline.  Neck ROM : rotation, tilt and flexion extension were normal for age and shoulder shrug was symmetrical.    Motor exam:  Symmetric bulk, tone and ROM.   Normal tone without cog wheeling, symmetric grip strength .   Sensory:  Fine touch, pinprick and vibration were  normal.  Proprioception tested in the upper extremities was normal.   Coordination: Rapid alternating movements in the fingers/hands were significantly slowed- delayed .  The Finger-to-nose maneuver was slowed but without evidence of ataxia, dysmetria or tremor. Patient used middle finger instead of index finger even after I reminded her.    Gait and station: Patient could rise unassisted from a seated position, walked without assistive device.  Stance is of normal width/ base and the patient turned with 5steps. No limp.  Normal arm-swing but stooped posture. No cog-wheeling here today, no resting tremor, still no pil roll tremor.  Toe and heel walk were deferred.  Deep tendon reflexes: in the  upper and lower extremities are symmetric and intact.  Babinski response was deferred .      After spending a total time of  25  minutes face to face and additional time for physical and neurologic examination, review of laboratory studies,  personal review of imaging studies, reports and results of other testing and review of  referral information / records as far as provided in visit, I have established the following assessments:  1)  OSA  in a Parkisnon's disease patient -  Using CPAP but finding no comfort I it. Husband reposrts snoring is gone, but she endorsred the a same level of sleepiness, nocturia frequency. Excessive daytime sleepiness with high degree of sleepiness after lunch.   2) CPAP Improved drastically her Morning headaches, dry mouth.    3) Nocturia is depending on diuretic intake, LASIX. 4) No REM BD - (remarkable in a Parkinson patient) but RLS .    I would like to thank Dr Jannifer Franklin for allowing me to meet with and to take care of this pleasant patient's sleep evaluation. I will ask her DME to switch her to an Eson or N 30 nasal mask.  I intructed her to use a pillow for 30 days as we can change mask type only after 6 month . I changed the settings to  4-13 cm water, 3 cm EPR.  I need her PVCP to address the nocturnal coughing.    IElectronically signed by: Larey Seat, MD 06/04/2019 1:56 PM  [facility] Board certified by The [employer] of Sleep Medicine and Diplomate of the [employer] of Sleep Medicine. Board certified In Neurology through the [place], Fellow of the [employer] of Neurology. Medical Director of [facility].

## 2019-06-05 ENCOUNTER — Other Ambulatory Visit (HOSPITAL_BASED_OUTPATIENT_CLINIC_OR_DEPARTMENT_OTHER): Payer: Self-pay | Admitting: Urology

## 2019-06-05 MED FILL — SOLIFENACIN SUCCINATE 10 MG: 10 | 30 days supply | Qty: 30 | Fill #0

## 2019-06-10 MED FILL — LEVOCETIRIZINE 5 MG TABLET: 5 | 30 days supply | Qty: 30 | Fill #4

## 2019-06-11 DIAGNOSIS — G4733 Obstructive sleep apnea (adult) (pediatric): Secondary | ICD-10-CM | POA: Diagnosis not present

## 2019-06-12 ENCOUNTER — Ambulatory Visit (INDEPENDENT_AMBULATORY_CARE_PROVIDER_SITE_OTHER): Payer: 59 | Admitting: Family Medicine

## 2019-06-12 DIAGNOSIS — N189 Chronic kidney disease, unspecified: Secondary | ICD-10-CM | POA: Diagnosis not present

## 2019-06-12 DIAGNOSIS — N1831 Chronic kidney disease, stage 3a: Secondary | ICD-10-CM | POA: Diagnosis not present

## 2019-06-12 DIAGNOSIS — N2581 Secondary hyperparathyroidism of renal origin: Secondary | ICD-10-CM | POA: Diagnosis not present

## 2019-06-12 MED FILL — FREESTYLE LANCETS: 90 days supply | Qty: 400 | Fill #0

## 2019-06-12 MED FILL — FREESTYLE LITE METER: 30 days supply | Qty: 1 | Fill #0

## 2019-06-13 ENCOUNTER — Ambulatory Visit (INDEPENDENT_AMBULATORY_CARE_PROVIDER_SITE_OTHER): Payer: 59 | Admitting: Allergy and Immunology

## 2019-06-13 ENCOUNTER — Encounter: Payer: Self-pay | Admitting: Allergy and Immunology

## 2019-06-13 ENCOUNTER — Other Ambulatory Visit: Payer: Self-pay

## 2019-06-13 VITALS — BP 112/60 | HR 62 | Temp 98.1°F | Resp 20 | Ht 66.0 in | Wt 236.1 lb

## 2019-06-13 DIAGNOSIS — J4541 Moderate persistent asthma with (acute) exacerbation: Secondary | ICD-10-CM

## 2019-06-13 DIAGNOSIS — H1013 Acute atopic conjunctivitis, bilateral: Secondary | ICD-10-CM

## 2019-06-13 DIAGNOSIS — J3089 Other allergic rhinitis: Secondary | ICD-10-CM | POA: Diagnosis not present

## 2019-06-13 DIAGNOSIS — K219 Gastro-esophageal reflux disease without esophagitis: Secondary | ICD-10-CM

## 2019-06-13 MED ORDER — DEXLANSOPRAZOLE 60 MG PO CPDR: DELAYED_RELEASE_CAPSULE | ORAL | 5 refills | Status: DC

## 2019-06-13 MED ORDER — OLOPATADINE HCL 0.2 % OP SOLN: 1.0000 [drp] | Freq: Every day | OPHTHALMIC | 5 refills | Status: DC | PRN

## 2019-06-13 MED FILL — DEXILANT DR 60 MG CAPSULE: 60 | 30 days supply | Qty: 30 | Fill #0

## 2019-06-13 MED FILL — OLOPATADINE HCL 0.2 % SOLN: 0.2 | 25 days supply | Qty: 3 | Fill #0

## 2019-06-13 NOTE — Progress Notes (Signed)
Follow-up Note  RE: Deanna Rowe MRN: [id number] DOB: 04-13-1955 Date of Office Visit: 06/13/2019  Primary care provider: Leighton Ruff, MD Referring provider: Leighton Ruff, MD  History of present illness: Deanna Rowe is a 64 y.o. female with persistent asthma, allergic rhinoconjunctivitis, and acid reflux presenting today for sick visit.  She was last seen in this clinic in November 2020.  She reports that her asthma is "not as bad as it was", however she still experiences nocturnal awakenings due to lower respiratory symptoms 2 times per week as well as asthma symptoms 2-4 times per week during the daytime.  She is currently taking Flovent 220 g, 2 inhalations via spacer device twice daily.  In her records she has a drug allergy to fluticasone/salmeterol.  She apparently had some type of reaction to Advair approximately 20 years ago, however she does not recall the details of that reaction.  She reports that her nasal allergy symptoms are well controlled with allergy medications as needed and that her ocular allergy symptoms are "much better" with the use of olopatadine eyedrops.  She feels that her acid reflux is poorly controlled despite compliance with Protonix and famotidine.  Assessment and plan: Moderate persistent asthma Currently with suboptimal control.  A laboratory order form has been provided for CBC with differential and total IgE.   Prednisone has been provided, 20 mg x 4 days, 10 mg x1 day, then stop. Do not start the prednisone until after labs have been drawn.  For now, increase Flovent 220 g to 3 inhalations via spacer device twice daily.   I would like to start her on BrezTri 160 g, 2 inhalations via spacer device twice a day, however she has a remote history of some side effect to Advair approximately 20 years ago.  She does not recall what the side effect was, so we will have her take 2 puffs of BrezTri while observed in the  office.  Continue montelukast 10 mg daily at bedtime and albuterol HFA, 1 to 2 inhalations every 4-6 hours if needed.  The patient has been asked to contact me if her symptoms persist or progress.   Perennial allergic rhinitis Stable.  Continue appropriate aeroallergen avoidance measures, levocetirizine(Xyzal), 5 mg daily as needed, and azelastine nasal spray, 1-2 sprays per nostril 2 times daily as needed.   Nasal saline lavage (NeilMed) has been recommended as needed and prior to medicated nasal sprays along with instructions for proper administration.  Allergic conjunctivitis  Treatment plan as outlined above for allergic rhinitis.  A prescription has been provided for olopatadine, one drop per eye daily as needed.  I have also recommended eye lubricant drops (i.e., Natural Tears) as needed.  Acid reflux Poorly controlled.  Discontinue pantoprazole  A prescription has been provided for Dexilant 60 mg daily, 30 minutes prior to breakfast.  Continue appropriate reflux lifestyle modifications and famotidine (Pepcid) 20 mg twice daily.  Follow-up with gastroenterology for further evaluation and treatment suggestions.   Meds ordered this encounter  Medications  . Olopatadine HCl (PATADAY) 0.2 % SOLN    Sig: Place 1 drop into both eyes daily as needed.    Dispense:  2.5 mL    Refill:  5  . dexlansoprazole (DEXILANT) 60 MG capsule    Sig: One capsule once a day, 30 minutes prior to breakfast.    Dispense:  30 capsule    Refill:  5    Diagnostics: Spirometry: FVC was 1.88 L and FEV1 was 1.58  L (72% predicted) with 80 mL postbronchodilator improvement.  This study was performed while the patient was asymptomatic.  Please see scanned spirometry results for details.    Physical examination: Blood pressure 112/60, pulse 62, temperature 98.1 F (36.7 C), temperature source Oral, resp. rate 20, height 5\' 6"  (1.676 m), weight 236 lb 1.8 oz (107.1 kg), SpO2 96 %.  General:  Alert, interactive, in no acute distress. HEENT: TMs pearly gray, turbinates mildly edematous without discharge, post-pharynx mildly erythematous. Neck: Supple without lymphadenopathy. Lungs: Clear to auscultation without wheezing, rhonchi or rales. CV: Normal S1, S2 without murmurs. Skin: Warm and dry, without lesions or rashes.  The following portions of the patient's history were reviewed and updated as appropriate: allergies, current medications, past family history, past medical history, past social history, past surgical history and problem list.  Current Outpatient Medications  Medication Sig Dispense Refill  . ACCU-CHEK FASTCLIX LANCETS MISC USE TO CHECK BLOOD SUGAR 3 TIMES A DAY 102 each 0  . ACCU-CHEK GUIDE test strip USE TO CHECK BLOOD SUGAR 3 TIMES PER DAY. (Patient taking differently: Use to check blood sugar 4 times per day.) 100 each 2  . albuterol (PROVENTIL HFA;VENTOLIN HFA) 108 (90 Base) MCG/ACT inhaler Inhale 2 puffs into the lungs as needed for wheezing or shortness of breath. 18 g 2  . amLODipine (NORVASC) 5 MG tablet Take 5 mg by mouth every morning.     Marland Kitchen aspirin EC 81 MG tablet Take 81 mg by mouth every evening.    Marland Kitchen atorvastatin (LIPITOR) 10 MG tablet Take 10 mg by mouth at bedtime.  4  . azelastine (ASTELIN) 0.1 % nasal spray Place 2 sprays into both nostrils 2 (two) times daily. Use in each nostril as directed 30 mL 5  . Brexpiprazole (REXULTI) 2 MG TABS Take 2 mg by mouth daily. (Patient taking differently: Take 1 mg by mouth daily. ) 30 tablet   . buPROPion (WELLBUTRIN XL) 150 MG 24 hr tablet Take 450 mg by mouth every morning.     . Calcium Carbonate-Vitamin D (CALCIUM 600+D) 600-400 MG-UNIT tablet Take 1 tablet by mouth daily.    . carbidopa-levodopa (SINEMET) 25-250 MG tablet Take 1 tablet by mouth 3 (three) times daily. 270 tablet 3  . famotidine (PEPCID) 20 MG tablet TAKE 1 TABLET (20 MG TOTAL) BY MOUTH 2 (TWO) TIMES DAILY. 60 tablet 2  . fluticasone (FLOVENT  HFA) 220 MCG/ACT inhaler Inhale 2 puffs into the lungs 2 (two) times daily.    . furosemide (LASIX) 20 MG tablet Take 20 mg by mouth every other day.    . gabapentin (NEURONTIN) 300 MG capsule Take 300 mg by mouth 2 (two) times daily.     Marland Kitchen HUMALOG KWIKPEN 100 UNIT/ML KwikPen Inject 14 Units into the skin 3 (three) times daily.     . hydrochlorothiazide (HYDRODIURIL) 12.5 MG tablet Take 12.5 mg by mouth daily.    . insulin glargine (LANTUS) 100 UNIT/ML injection Inject 44 Units into the skin every morning.     . Lancets (FREESTYLE) lancets     . levocetirizine (XYZAL) 5 MG tablet Take 1 tablet (5 mg total) by mouth every evening. 30 tablet 5  . LORazepam (ATIVAN) 1 MG tablet Take 2 mg by mouth 3 (three) times daily.     Marland Kitchen LORazepam (ATIVAN) 2 MG tablet Take 2 mg by mouth 3 (three) times daily as needed.    Marland Kitchen losartan (COZAAR) 100 MG tablet Take 100 mg by mouth daily.    Marland Kitchen  mirabegron ER (MYRBETRIQ) 50 MG TB24 tablet Take 50 mg by mouth daily.    . montelukast (SINGULAIR) 10 MG tablet TAKE 1 TABLET (10 MG TOTAL) BY MOUTH AT BEDTIME. 30 tablet 5  . Olopatadine HCl (PATADAY) 0.2 % SOLN Place 1 drop into both eyes daily as needed. 2.5 mL 5  . pantoprazole (PROTONIX) 40 MG tablet Take 40 mg by mouth 2 (two) times daily.     . propranolol (INDERAL) 40 MG tablet TAKE 1 TABLET (40 MG TOTAL) BY MOUTH 2 (TWO) TIMES DAILY. 180 tablet 2  . rOPINIRole (REQUIP) 2 MG tablet Take 2 mg by mouth 2 (two) times daily.  3  . solifenacin (VESICARE) 10 MG tablet Take 10 mg by mouth daily.    . valACYclovir (VALTREX) 1000 MG tablet Take 500 mg by mouth daily.     . vitamin C (ASCORBIC ACID) 500 MG tablet Take 500 mg by mouth daily.    . Vitamin D, Ergocalciferol, 2000 units CAPS Take 1 capsule by mouth daily. 2000 units daily.    Marland Kitchen vortioxetine HBr (TRINTELLIX) 20 MG TABS Take 20 mg by mouth daily.     Marland Kitchen zolpidem (AMBIEN) 5 MG tablet Take 5 mg by mouth at bedtime.     Marland Kitchen dexlansoprazole (DEXILANT) 60 MG capsule One  capsule once a day, 30 minutes prior to breakfast. 30 capsule 5   No current facility-administered medications for this visit.   Facility-Administered Medications Ordered in Other Visits  Medication Dose Route Frequency Provider Last Rate Last Admin  . tranexamic acid (CYKLOKAPRON) topical -INTRAOP  2,000 mg Topical Once Cecilio Asper, [employer], PA-C        Allergies  Allergen Reactions  . Fluticasone-Salmeterol Anaphylaxis  . Codeine Nausea And Vomiting  . Fetzima [Levomilnacipran] Nausea And Vomiting  . Nsaids Other (See Comments)    Upset stomach   . Trulicity [Dulaglutide] Nausea And Vomiting    Significant and undesirably rapid (40 lbs) weight loss   . Xanax [Alprazolam] Other (See Comments)    disoriented  . Amoxicillin Rash    Has patient had a PCN reaction causing immediate rash, facial/tongue/throat swelling, SOB or lightheadedness with hypotension: Yes Has patient had a PCN reaction causing severe rash involving mucus membranes or skin necrosis: No Has patient had a PCN reaction that required hospitalization: No Has patient had a PCN reaction occurring within the last 10 years: No If all of the above answers are "NO", then may proceed with Cephalosporin use.   . Pseudoephedrine Hcl Er Palpitations   Review of systems: Review of systems negative except as noted in HPI / PMHx.  Past Medical History:  Diagnosis Date  . Anemia   . Anxiety   . Arthritis    L HIP  . Asthma   . Avascular necrosis of hip (HCC)    LEFT  . Back pain   . Chest pain   . Constipation   . Depression   . Diabetes mellitus   . Diabetes mellitus, type II ([place])   . Dry cough   . Edema, lower extremity   . GERD (gastroesophageal reflux disease)   . High cholesterol   . Hypertension   . Insomnia    takes Ambien nightly  . Joint pain   . Kidney disease   . Kidney disease   . Obesity   . Parkinsonism ([place]) 03/02/2017  . PONV (postoperative nausea and vomiting)   . RLS (restless legs syndrome)  01/15/2018  . Sleep apnea   .  Swallowing difficulty   . Thyroid disease   . TIA (transient ischemic attack) 2012   no residual problems  . Urgency incontinence   . Vitamin D deficiency     Family History  Problem Relation Age of Onset  . Alcohol abuse Brother   . Alcohol abuse Brother   . Alcohol abuse Brother   . Alcohol abuse Brother   . High blood pressure Mother   . Hyperlipidemia Mother   . Heart disease Mother   . Heart disease Father   . Alcoholism Father   . Allergic rhinitis Sister   . Diabetes Neg Hx   . Angioedema Neg Hx   . Asthma Neg Hx   . Eczema Neg Hx   . Immunodeficiency Neg Hx   . Urticaria Neg Hx   . Breast cancer Neg Hx     Social History   Socioeconomic History  . Marital status: Married    Spouse name: Simona Huh  . Number of children: Not on file  . Years of education: Not on file  . Highest education level: Not on file  Occupational History  . Occupation: retired Therapist, sports  Tobacco Use  . Smoking status: Never Smoker  . Smokeless tobacco: Never Used  Substance and Sexual Activity  . Alcohol use: No    Alcohol/week: 0.0 standard drinks  . Drug use: No  . Sexual activity: Not Currently  Other Topics Concern  . Not on file  Social History Narrative   Lives with husband   Caffeine use: none   Right handed    Social Determinants of Health   Financial Resource Strain:   . Difficulty of Paying Living Expenses:   Food Insecurity:   . Worried About Charity fundraiser in the Last Year:   . Arboriculturist in the Last Year:   Transportation Needs:   . Film/video editor (Medical):   Marland Kitchen Lack of Transportation (Non-Medical):   Physical Activity:   . Days of Exercise per Week:   . Minutes of Exercise per Session:   Stress:   . Feeling of Stress :   Social Connections:   . Frequency of Communication with Friends and Family:   . Frequency of Social Gatherings with Friends and Family:   . Attends Religious Services:   . Active Member of Clubs  or Organizations:   . Attends Archivist Meetings:   Marland Kitchen Marital Status:   Intimate Partner Violence:   . Fear of Current or Ex-Partner:   . Emotionally Abused:   Marland Kitchen Physically Abused:   . Sexually Abused:     I appreciate the opportunity to take part in Deon's care. Please do not hesitate to contact me with questions.  Sincerely,   R. Edgar Frisk, MD

## 2019-06-13 NOTE — Assessment & Plan Note (Addendum)
Poorly controlled.  Discontinue pantoprazole  A prescription has been provided for Dexilant 60 mg daily, 30 minutes prior to breakfast.  Continue appropriate reflux lifestyle modifications and famotidine (Pepcid) 20 mg twice daily.  Follow-up with gastroenterology for further evaluation and treatment suggestions.

## 2019-06-13 NOTE — Assessment & Plan Note (Signed)
Stable.  Continue appropriate aeroallergen avoidance measures, levocetirizine(Xyzal), 5 mg daily as needed, and azelastine nasal spray, 1-2 sprays per nostril 2 times daily as needed.   Nasal saline lavage (NeilMed) has been recommended as needed and prior to medicated nasal sprays along with instructions for proper administration.

## 2019-06-13 NOTE — Assessment & Plan Note (Addendum)
Currently with suboptimal control.  A laboratory order form has been provided for CBC with differential and total IgE.   Prednisone has been provided, 20 mg x 4 days, 10 mg x1 day, then stop. Do not start the prednisone until after labs have been drawn.  For now, increase Flovent 220 g to 3 inhalations via spacer device twice daily.   I would like to start her on BrezTri 160 g, 2 inhalations via spacer device twice a day, however she has a remote history of some side effect to Advair approximately 20 years ago.  She does not recall what the side effect was, so we will have her take 2 puffs of BrezTri while observed in the office.  Continue montelukast 10 mg daily at bedtime and albuterol HFA, 1 to 2 inhalations every 4-6 hours if needed.  The patient has been asked to contact me if her symptoms persist or progress.

## 2019-06-13 NOTE — Assessment & Plan Note (Signed)
   Treatment plan as outlined above for allergic rhinitis.  A prescription has been provided for olopatadine, one drop per eye daily as needed.  I have also recommended eye lubricant drops (i.e., Natural Tears) as needed.

## 2019-06-13 NOTE — Patient Instructions (Addendum)
Moderate persistent asthma Currently with suboptimal control.  A laboratory order form has been provided for CBC with differential and total IgE.   Prednisone has been provided, 20 mg x 4 days, 10 mg x1 day, then stop. Do not start the prednisone until after labs have been drawn.  For now, increase Flovent 220 g to 3 inhalations via spacer device twice daily.   I would like to start her on BrezTri 160 g, 2 inhalations via spacer device twice a day, however she has a remote history of some side effect to Advair approximately 20 years ago.  She does not recall what the side effect was, so we will have her take 2 puffs of BrezTri while observed in the office.  Continue montelukast 10 mg daily at bedtime and albuterol HFA, 1 to 2 inhalations every 4-6 hours if needed.  The patient has been asked to contact me if her symptoms persist or progress.   Perennial allergic rhinitis Stable.  Continue appropriate aeroallergen avoidance measures, levocetirizine(Xyzal), 5 mg daily as needed, and azelastine nasal spray, 1-2 sprays per nostril 2 times daily as needed.   Nasal saline lavage (NeilMed) has been recommended as needed and prior to medicated nasal sprays along with instructions for proper administration.  Allergic conjunctivitis  Treatment plan as outlined above for allergic rhinitis.  A prescription has been provided for olopatadine, one drop per eye daily as needed.  I have also recommended eye lubricant drops (i.e., Natural Tears) as needed.  Acid reflux Poorly controlled.  Discontinue pantoprazole  A prescription has been provided for Dexilant 60 mg daily, 30 minutes prior to breakfast.  Continue appropriate reflux lifestyle modifications and famotidine (Pepcid) 20 mg twice daily.  Follow-up with gastroenterology for further evaluation and treatment suggestions.   Return in about 2 months (around 08/13/2019), or if symptoms worsen or fail to improve.  Lifestyle Changes for  Controlling GERD  When you have GERD, stomach acid feels as if it's backing up toward your mouth. Whether or not you take medication to control your GERD, your symptoms can often be improved with lifestyle changes.   Raise Your Head  Reflux is more likely to strike when you're lying down flat, because stomach fluid can  flow backward more easily. Raising the head of your bed 4-6 inches can help. To do this:  Slide blocks or books under the legs at the head of your bed. Or, place a wedge under  the mattress. Many foam stores can make a suitable wedge for you. The wedge  should run from your waist to the top of your head.  Don't just prop your head on several pillows. This increases pressure on your  stomach. It can make GERD worse.  Watch Your Eating Habits Certain foods may increase the acid in your stomach or relax the lower esophageal sphincter, making GERD more likely. It's best to avoid the following:  Coffee, tea, and carbonated drinks (with and without caffeine)  Fatty, fried, or spicy food  Mint, chocolate, onions, and tomatoes  Any other foods that seem to irritate your stomach or cause you pain  Relieve the Pressure  Eat smaller meals, even if you have to eat more often.  Don't lie down right after you eat. Wait a few hours for your stomach to empty.  Avoid tight belts and tight-fitting clothes.  Lose excess weight.  Tobacco and Alcohol  Avoid smoking tobacco and drinking alcohol. They can make GERD symptoms worse.

## 2019-06-14 ENCOUNTER — Encounter: Payer: Self-pay | Admitting: Allergy and Immunology

## 2019-06-17 ENCOUNTER — Encounter (INDEPENDENT_AMBULATORY_CARE_PROVIDER_SITE_OTHER): Payer: Self-pay | Admitting: Family Medicine

## 2019-06-17 ENCOUNTER — Other Ambulatory Visit: Payer: Self-pay

## 2019-06-17 ENCOUNTER — Ambulatory Visit (INDEPENDENT_AMBULATORY_CARE_PROVIDER_SITE_OTHER): Payer: 59 | Admitting: Family Medicine

## 2019-06-17 ENCOUNTER — Telehealth: Payer: Self-pay | Admitting: Allergy and Immunology

## 2019-06-17 VITALS — BP 122/67 | HR 50 | Temp 97.8°F | Ht 66.0 in | Wt 233.0 lb

## 2019-06-17 DIAGNOSIS — E1165 Type 2 diabetes mellitus with hyperglycemia: Secondary | ICD-10-CM

## 2019-06-17 DIAGNOSIS — Z6837 Body mass index (BMI) 37.0-37.9, adult: Secondary | ICD-10-CM

## 2019-06-17 DIAGNOSIS — E1159 Type 2 diabetes mellitus with other circulatory complications: Secondary | ICD-10-CM | POA: Diagnosis not present

## 2019-06-17 DIAGNOSIS — I10 Essential (primary) hypertension: Secondary | ICD-10-CM

## 2019-06-17 DIAGNOSIS — I152 Hypertension secondary to endocrine disorders: Secondary | ICD-10-CM

## 2019-06-17 MED FILL — FREESTYLE LITE TEST STRIP: 90 days supply | Qty: 400 | Fill #0

## 2019-06-17 NOTE — Telephone Encounter (Signed)
Specifically, what symptoms did she experience in 2015 when she used Advair?  I am asking because she is able to use inhaled corticosteroids and short acting bronchodilators, therefore it seems unlikely that she actually had an anaphylactic reaction.  Her asthma has been poorly controlled and she needs some latitude with her treatment plan. Thanks.

## 2019-06-17 NOTE — Telephone Encounter (Signed)
Just spoke to the pt. She can't really remember what happened bc it was so long ago. She was under the care of Dr. Elba Barman who has moved out of state. Pt. Use to see Dr Inda Merlin at [facility] and [place]. Pt. Was at another appointment so she really couldn't talk. Pt. Now sees Dr. Leighton Ruff which is her pcp at [facility] at [facility]. I then called her pcp's office and spoke to Bovina who went through a lot of notes. Pt. tried her husband's advair in February 2015. Dr. Inda Merlin started her on Advair diskus 100/50 02.27.2015. Dr. Inda Merlin increased her advair diskus to 250/50. No notes stating she had any issues with the advair. Olin Hauser couldn't find any ED notes stating where she had an anaphylaxis to the Advair diskus. Dr. Inda Merlin referred pt. To [facility] and she was switched to qvar.

## 2019-06-17 NOTE — Telephone Encounter (Signed)
Ok, lets have her come into the office and take a couple puffs of BrezTri (sample) - not a formal visit/challenge but just to be on the safe side have her in the waiting room for about 30 minutes after the inhalations. If no problem have her start BrezTri 160, 2 puffs via spacer bid. Thanks.

## 2019-06-17 NOTE — Telephone Encounter (Signed)
Patient called stating per her PCP she took Advair on 05/31/2013 and had an anaphylactic reaction on 06/26/2013.

## 2019-06-18 ENCOUNTER — Encounter (INDEPENDENT_AMBULATORY_CARE_PROVIDER_SITE_OTHER): Payer: Self-pay | Admitting: Family Medicine

## 2019-06-18 ENCOUNTER — Ambulatory Visit: Payer: Self-pay | Admitting: Adult Health

## 2019-06-18 LAB — IGE: IgE (Immunoglobulin E), Serum: 58 IU/mL (ref 6–495)

## 2019-06-18 MED FILL — buPROPion HCL ER (XL) 150 M: 150 | 90 days supply | Qty: 270 | Fill #1

## 2019-06-18 MED FILL — MONTELUKAST SOD 10 MG TAB: 10 | 30 days supply | Qty: 30 | Fill #4

## 2019-06-18 MED FILL — REXULTI 2 MG TABLET: 2 | 90 days supply | Qty: 90 | Fill #0

## 2019-06-18 NOTE — Telephone Encounter (Signed)
Noted  

## 2019-06-18 NOTE — Telephone Encounter (Signed)
Okay, for now, continue BrezTri 160, 2 inhalations via spacer device twice daily.  This shakiness should subside after using the medication for a few more days. Thanks.

## 2019-06-18 NOTE — Telephone Encounter (Signed)
Lm for pt to call us back to see how she was doing

## 2019-06-18 NOTE — Telephone Encounter (Signed)
Thank you :)

## 2019-06-18 NOTE — Progress Notes (Signed)
Chief Complaint:   OBESITY Deanna Rowe is here to discuss her progress with her obesity treatment plan along with follow-up of her obesity related diagnoses. Deanna Rowe is on the Category 3 Plan and states she is following her eating plan approximately 50% of the time. Deanna Rowe states she is doing 0 minutes 0 times per week.  Today's visit was #: 5 Starting weight: 237 lbs Starting date: 04/16/2019 Today's weight: 233 lbs Today's date: 06/18/2019 Total lbs lost to date: 4 Total lbs lost since last in-office visit: 0  Interim History: Deanna Rowe is adhering to the plan at breakfast and dinner. She skips lunch about 4 out of 7 days per week. She does not struggle with hunger at all.  Subjective:   1. Type 2 diabetes mellitus with hyperglycemia, without long-term current use of insulin (HCC) Deanna Rowe's diabetes is not well controlled. Last A1c was 7.9. She is on Lantus and Humalog. Her fasting BGs range between 81 and 127, and 2 hour post prandials range between 146 and 176. She notes hypoglycemia when she skips lunch. She reports her sugars have recently improved. I discussed labs with the patient today.  Lab Results  Component Value Date   HGBA1C 7.9 (H) 04/16/2019    2. Essential hypertension Deanna Rowe's blood pressure is well controlled with Norvasc 5 mg, hydrochlorothiazide 12.5 mg, losartan 100 mg, and propranolol 40 mg BID.  Assessment/Plan:   1. Type 2 diabetes mellitus with hyperglycemia, without long-term current use of insulin (HCC) Good blood sugar control is important to decrease the likelihood of diabetic complications such as nephropathy, neuropathy, limb loss, blindness, coronary artery disease, and death. Intensive lifestyle modification including diet, exercise and weight loss are the first line of treatment for diabetes. Hypoglycemia handout was given today. Deanna Rowe agreed to continue her medications, and will follow up.  2. Essential hypertension Deanna Rowe is working on  healthy weight loss and exercise to improve blood pressure control. We will watch for signs of hypotension as she continues her lifestyle modifications. Deanna Rowe agreed to continue all of her medications, and will follow up.  3. Class 2 severe obesity with serious comorbidity and body mass index (BMI) of 37.0 to 37.9 in adult, unspecified obesity type (HCC) Deanna Rowe is currently in the action stage of change. As such, her goal is to continue with weight loss efforts. She has agreed to the Category 3 Plan.   Deanna Rowe will have a sandwich at lunch and yogurt from lunch after supper. She can leave off the fruit at this time.  Exercise goals: No exercise has been prescribed at this time.  Behavioral modification strategies: increasing lean protein intake, no skipping meals and meal planning and cooking strategies.  Deanna Rowe has agreed to follow-up with our clinic in 2 weeks. She was informed of the importance of frequent follow-up visits to maximize her success with intensive lifestyle modifications for her multiple health conditions.   Objective:   Blood pressure 122/67, pulse (!) 50, temperature 97.8 F (36.6 C), temperature source Oral, height 5\' 6"  (1.676 m), weight 233 lb (105.7 kg), SpO2 97 %. Body mass index is 37.61 kg/m.  General: Cooperative, alert, well developed, in no acute distress. HEENT: Conjunctivae and lids unremarkable. Cardiovascular: Regular rhythm.  Lungs: Normal work of breathing. Neurologic: No focal deficits.   Lab Results  Component Value Date   CREATININE 1.10 (H) 04/16/2019   BUN 11 04/16/2019   NA 142 04/16/2019   K 4.5 04/16/2019   CL 103 04/16/2019   CO2 27  04/16/2019   Lab Results  Component Value Date   ALT 21 04/16/2019   AST 23 04/16/2019   ALKPHOS 58 04/16/2019   BILITOT 0.3 04/16/2019   Lab Results  Component Value Date   HGBA1C 7.9 (H) 04/16/2019   HGBA1C 6.9 11/25/2016   HGBA1C 6.0 06/10/2016   HGBA1C 5.9 02/12/2016   HGBA1C 6.1  12/01/2015   Lab Results  Component Value Date   INSULIN 12.3 04/16/2019   Lab Results  Component Value Date   TSH 1.780 04/16/2019   Lab Results  Component Value Date   CHOL 155 04/16/2019   HDL 84 04/16/2019   LDLCALC 57 04/16/2019   TRIG 71 04/16/2019   Lab Results  Component Value Date   WBC 6.8 04/16/2019   HGB 12.3 04/16/2019   HCT 36.1 04/16/2019   MCV 96 04/16/2019   PLT 274 04/16/2019   No results found for: IRON, TIBC, FERRITIN  Attestation Statements:   Reviewed by clinician on day of visit: allergies, medications, problem list, medical history, surgical history, family history, social history, and previous encounter notes.  Time spent on visit including pre-visit chart review and post-visit care and charting was 31 minutes.    Wilhemena Durie, am acting as Location manager for [employer], FNP-C.  I have reviewed the above documentation for accuracy and completeness, and I agree with the above. - Georgianne Fick, FNP

## 2019-06-18 NOTE — Telephone Encounter (Signed)
Pt came in had 2 puffs and waited the 30 minutes she only noted some shakiness, other then that she was fine. I will call her later and check on her. She was instructed to call us if she started feeling strange.

## 2019-06-18 NOTE — Telephone Encounter (Signed)
Patient informed. 

## 2019-06-18 NOTE — Telephone Encounter (Signed)
I spoke with pt, she is coming in today for the sample and is ok staying for 30 minutes for monitoring. Im also ccing anne so she is aware of this too ,being she is in clinic today

## 2019-06-18 NOTE — Telephone Encounter (Signed)
Pt called back, I asked her how she was feeling pt stated she is doing great, and the shakiness has subsided.

## 2019-06-19 DIAGNOSIS — D631 Anemia in chronic kidney disease: Secondary | ICD-10-CM | POA: Diagnosis not present

## 2019-06-19 DIAGNOSIS — I129 Hypertensive chronic kidney disease with stage 1 through stage 4 chronic kidney disease, or unspecified chronic kidney disease: Secondary | ICD-10-CM | POA: Diagnosis not present

## 2019-06-19 DIAGNOSIS — N1831 Chronic kidney disease, stage 3a: Secondary | ICD-10-CM | POA: Diagnosis not present

## 2019-06-19 DIAGNOSIS — R399 Unspecified symptoms and signs involving the genitourinary system: Secondary | ICD-10-CM | POA: Diagnosis not present

## 2019-06-19 DIAGNOSIS — E559 Vitamin D deficiency, unspecified: Secondary | ICD-10-CM | POA: Diagnosis not present

## 2019-06-19 DIAGNOSIS — E1122 Type 2 diabetes mellitus with diabetic chronic kidney disease: Secondary | ICD-10-CM | POA: Diagnosis not present

## 2019-06-21 MED FILL — MYRBETRIQ ER 50 MG TABLET: 50 | 30 days supply | Qty: 30 | Fill #0

## 2019-06-21 MED FILL — CEPHALEXIN 500 MG CAPSULE: 500 | 7 days supply | Qty: 14 | Fill #0

## 2019-06-26 ENCOUNTER — Ambulatory Visit: Payer: Self-pay | Admitting: Adult Health

## 2019-06-28 DIAGNOSIS — G4733 Obstructive sleep apnea (adult) (pediatric): Secondary | ICD-10-CM | POA: Diagnosis not present

## 2019-07-02 ENCOUNTER — Other Ambulatory Visit: Payer: Self-pay

## 2019-07-02 ENCOUNTER — Ambulatory Visit (INDEPENDENT_AMBULATORY_CARE_PROVIDER_SITE_OTHER): Payer: 59 | Admitting: Family Medicine

## 2019-07-02 ENCOUNTER — Encounter (INDEPENDENT_AMBULATORY_CARE_PROVIDER_SITE_OTHER): Payer: Self-pay | Admitting: Family Medicine

## 2019-07-02 VITALS — BP 110/60 | HR 58 | Temp 98.0°F | Ht 66.0 in | Wt 234.0 lb

## 2019-07-02 DIAGNOSIS — Z6837 Body mass index (BMI) 37.0-37.9, adult: Secondary | ICD-10-CM | POA: Diagnosis not present

## 2019-07-02 DIAGNOSIS — J301 Allergic rhinitis due to pollen: Secondary | ICD-10-CM | POA: Diagnosis not present

## 2019-07-02 DIAGNOSIS — R0602 Shortness of breath: Secondary | ICD-10-CM

## 2019-07-02 DIAGNOSIS — Z794 Long term (current) use of insulin: Secondary | ICD-10-CM | POA: Diagnosis not present

## 2019-07-02 DIAGNOSIS — E1142 Type 2 diabetes mellitus with diabetic polyneuropathy: Secondary | ICD-10-CM

## 2019-07-02 NOTE — Progress Notes (Signed)
Chief Complaint:   OBESITY Deanna Rowe is here to discuss her progress with her obesity treatment plan along with follow-up of her obesity related diagnoses. Deanna Rowe is on the Category 3 Plan and states she is following her eating plan approximately 50% of the time. Deanna Rowe states she is doing yard work for 7 hours 3 times per week.  Today's visit was #: 6 Starting weight: 237 lbs Starting date: 04/16/2019 Today's weight: 234 lbs Today's date: 07/02/2019 Total lbs lost to date: 3 Total lbs lost since last in-office visit: 0  Interim History: Deanna Rowe reports eating out is getting her off track. She eats out 3 times per week at steak houses, diners, and seafood places.  She does not cook much. When she is dining out she is off the plan mostly. She is on the plan at breakfast. She skips lunch about 3 times per week.  Subjective:   1. SOB (shortness of breath) on exertion Deanna Rowe has had increased shortness of breath with exertion. She is noticeably SOB after walking from waiting room. She reports her husband has noticed this also. She denies chest pain or shortness of breath at rest. She has asthma, but she does not feel it is from that. She denies increased lower extremity edema or orthopnea.  2. Type 2 diabetes mellitus with diabetic polyneuropathy, without long-term current use of insulin ([place]) Deanna Rowe's diabetes mellitus is not well controlled. Last A1c was 7.9. She is checking her sugars 4 times a day. Her lowest CBG is 81 and highest is 170. She is on Lantus and Humalog, and notes rare hypoglycemia. She carries glucose tablets with her. She sees Dr. Buddy Duty for her diabetes mellitus.  Assessment/Plan:   1. SOB (shortness of breath) on exertion Deanna Rowe does feel that she gets out of breath more easily that she used. Deanna Rowe's shortness of breath appears to be obesity related and exercise induced. She has agreed to work on weight loss and gradually increase exercise to treat her exercise  induced shortness of breath. She will see her primary care physician within 1-3 days. I advised of the importance of this to rule out heart issues. Will continue to monitor closely.  2. Type 2 diabetes mellitus with diabetic polyneuropathy, without long-term current use of insulin (HCC) Good blood sugar control is important to decrease the likelihood of diabetic complications such as nephropathy, neuropathy, limb loss, blindness, coronary artery disease, and death. Intensive lifestyle modification including diet, exercise and weight loss are the first line of treatment for diabetes. Deanna Rowe will continue to follow up with her Endocrinologist.  3. Class 2 severe obesity with serious comorbidity and body mass index (BMI) of 37.0 to 37.9 in adult, unspecified obesity type (HCC) Deanna Rowe is currently in the action stage of change. As such, her goal is to continue with weight loss efforts. She has agreed to the Category 3 Plan.   Deanna Rowe is to decrease off the plan dinner meals to one time per week.  Exercise goals: As is.  Behavioral modification strategies: increasing lean protein intake, decreasing simple carbohydrates, increasing vegetables, no skipping meals and planning for success.  Deanna Rowe has agreed to follow-up with our clinic in 2 weeks. She was informed of the importance of frequent follow-up visits to maximize her success with intensive lifestyle modifications for her multiple health conditions.   Objective:   Blood pressure 110/60, pulse (!) 58, temperature 98 F (36.7 C), temperature source Oral, height 5\' 6"  (1.676 m), weight 234 lb (106.1 kg), SpO2  97 %. Body mass index is 37.77 kg/m.  General: Cooperative, alert, well developed, in no acute distress. HEENT: Conjunctivae and lids unremarkable. Cardiovascular: Regular rhythm.  Lungs: Normal work of breathing. Neurologic: No focal deficits.   Lab Results  Component Value Date   CREATININE 1.10 (H) 04/16/2019   BUN 11  04/16/2019   NA 142 04/16/2019   K 4.5 04/16/2019   CL 103 04/16/2019   CO2 27 04/16/2019   Lab Results  Component Value Date   ALT 21 04/16/2019   AST 23 04/16/2019   ALKPHOS 58 04/16/2019   BILITOT 0.3 04/16/2019   Lab Results  Component Value Date   HGBA1C 7.9 (H) 04/16/2019   HGBA1C 6.9 11/25/2016   HGBA1C 6.0 06/10/2016   HGBA1C 5.9 02/12/2016   HGBA1C 6.1 12/01/2015   Lab Results  Component Value Date   INSULIN 12.3 04/16/2019   Lab Results  Component Value Date   TSH 1.780 04/16/2019   Lab Results  Component Value Date   CHOL 155 04/16/2019   HDL 84 04/16/2019   LDLCALC 57 04/16/2019   TRIG 71 04/16/2019   Lab Results  Component Value Date   WBC 6.8 04/16/2019   HGB 12.3 04/16/2019   HCT 36.1 04/16/2019   MCV 96 04/16/2019   PLT 274 04/16/2019   No results found for: IRON, TIBC, FERRITIN  Attestation Statements:   Reviewed by clinician on day of visit: allergies, medications, problem list, medical history, surgical history, family history, social history, and previous encounter notes.   Wilhemena Durie, am acting as Location manager for [employer], FNP-C.  I have reviewed the above documentation for accuracy and completeness, and I agree with the above. -  Georgianne Fick, FNP

## 2019-07-09 MED FILL — TRINTELLIX 20 MG TABLET: 20 | 90 days supply | Qty: 90 | Fill #1

## 2019-07-09 MED FILL — FAMOTIDINE 20 MG TABS: 20 | 30 days supply | Qty: 60 | Fill #2

## 2019-07-09 MED FILL — SOLIFENACIN SUCCINATE 10 MG: 10 | 30 days supply | Qty: 30 | Fill #1

## 2019-07-09 MED FILL — VALACYCLOVIR HCL 500 MG TAB: 500 | 90 days supply | Qty: 90 | Fill #2

## 2019-07-12 DIAGNOSIS — N3941 Urge incontinence: Secondary | ICD-10-CM | POA: Diagnosis not present

## 2019-07-12 DIAGNOSIS — R3915 Urgency of urination: Secondary | ICD-10-CM | POA: Diagnosis not present

## 2019-07-12 DIAGNOSIS — R350 Frequency of micturition: Secondary | ICD-10-CM | POA: Diagnosis not present

## 2019-07-16 ENCOUNTER — Other Ambulatory Visit: Payer: Self-pay

## 2019-07-16 ENCOUNTER — Ambulatory Visit (INDEPENDENT_AMBULATORY_CARE_PROVIDER_SITE_OTHER): Payer: 59 | Admitting: Family Medicine

## 2019-07-16 ENCOUNTER — Encounter (INDEPENDENT_AMBULATORY_CARE_PROVIDER_SITE_OTHER): Payer: Self-pay | Admitting: Family Medicine

## 2019-07-16 ENCOUNTER — Telehealth: Payer: Self-pay | Admitting: Allergy and Immunology

## 2019-07-16 VITALS — BP 118/69 | HR 64 | Temp 98.1°F | Ht 66.0 in | Wt 234.0 lb

## 2019-07-16 DIAGNOSIS — Z794 Long term (current) use of insulin: Secondary | ICD-10-CM | POA: Diagnosis not present

## 2019-07-16 DIAGNOSIS — E1142 Type 2 diabetes mellitus with diabetic polyneuropathy: Secondary | ICD-10-CM | POA: Diagnosis not present

## 2019-07-16 DIAGNOSIS — I10 Essential (primary) hypertension: Secondary | ICD-10-CM

## 2019-07-16 DIAGNOSIS — E1159 Type 2 diabetes mellitus with other circulatory complications: Secondary | ICD-10-CM | POA: Diagnosis not present

## 2019-07-16 DIAGNOSIS — Z6837 Body mass index (BMI) 37.0-37.9, adult: Secondary | ICD-10-CM | POA: Diagnosis not present

## 2019-07-16 DIAGNOSIS — I152 Hypertension secondary to endocrine disorders: Secondary | ICD-10-CM

## 2019-07-16 MED FILL — MONTELUKAST SOD 10 MG TAB: 10 | 30 days supply | Qty: 30 | Fill #5

## 2019-07-16 MED FILL — LEVOCETIRIZINE 5 MG TABLET: 5 | 30 days supply | Qty: 30 | Fill #5

## 2019-07-16 MED FILL — LOSARTAN-HCTZ 100-12.5 MG T: 100-12.5 | 90 days supply | Qty: 90 | Fill #1

## 2019-07-16 NOTE — Progress Notes (Signed)
Chief Complaint:   OBESITY Deanna Rowe is here to discuss her progress with her obesity treatment plan along with follow-up of her obesity related diagnoses. [name] is on the Category 3 Plan and states she is following her eating plan approximately 75% of the time. [name] states she is doing 0 minutes 0 times per week.  Today's visit was #: 7 Starting weight: 237 lbs Starting date: 04/16/2019 Today's weight: 234 lbs Today's date: 07/16/2019 Total lbs lost to date: 3 Total lbs lost since last in-office visit: 0  Interim History: Shila has decreased eating out (2 times in the last 2 week rather than 4). She is not skipping lunch anymore. She admits to being off the plan when she eats out.  Subjective:   1. Type 2 diabetes mellitus with diabetic polyneuropathy, with long-term current use of insulin ([place]) DM not well controlled. Deanna Rowe's blood glucose range between 80 and 140. She is on Humalog 17 units TID, and Lantus 44 units daily. She is managed by Dr. Buddy Duty. She notes hypoglycemia episode 1 time (56 in the afternoon). She had not eaten all of her lunch. Lab Results  Component Value Date   HGBA1C 7.9 (H) 04/16/2019   HGBA1C 6.9 11/25/2016   HGBA1C 6.0 06/10/2016   Lab Results  Component Value Date   MICROALBUR <0.7 02/12/2016   LDLCALC 57 04/16/2019   CREATININE 1.10 (H) 04/16/2019    2. Essential hypertension Deanna Rowe's blood pressure is well controlled on hydrochlorothiazide, losartan, propranolol, and amlodipine. She denies chest pain, and shortness of breath has improved. BP Readings from Last 3 Encounters:  07/16/19 118/69  07/02/19 110/60  06/17/19 122/67     Assessment/Plan:   1. Type 2 diabetes mellitus with diabetic polyneuropathy, with long-term current use of insulin (HCC) Good blood sugar control is important to decrease the likelihood of diabetic complications such as nephropathy, neuropathy, limb loss, blindness, coronary artery disease, and death.  Intensive lifestyle modification including diet, exercise and weight loss are the first line of treatment for diabetes. Deanna Rowe is to contact Dr. Buddy Duty if she has hypoglycemia more than 1 time in 2 weeks.  2. Essential hypertension Deanna Rowe is working on healthy weight loss and exercise to improve blood pressure control. Krimson will continue all of her medications. We will watch for signs of hypotension as she continues her lifestyle modifications.  3. Class 2 severe obesity with serious comorbidity and body mass index (BMI) of 37.0 to 37.9 in adult, unspecified obesity type (HCC) Deanna Rowe is currently in the action stage of change. As such, her goal is to continue with weight loss efforts. She has agreed to the Category 3 Plan.   Handout given today: Protein Equivalents.  Exercise goals: No exercise has been prescribed at this time.  Behavioral modification strategies: increasing lean protein intake, decreasing simple carbohydrates, decreasing eating out and meal planning and cooking strategies.  [name] has agreed to follow-up with our clinic in 2 weeks. She was informed of the importance of frequent follow-up visits to maximize her success with intensive lifestyle modifications for her multiple health conditions.   Objective:   Blood pressure 118/69, pulse 64, temperature 98.1 F (36.7 C), temperature source Oral, height 5\' 6"  (1.676 m), weight 234 lb (106.1 kg), SpO2 96 %. Body mass index is 37.77 kg/m.  General: Cooperative, alert, well developed, in no acute distress. HEENT: Conjunctivae and lids unremarkable. Cardiovascular: Regular rhythm.  Lungs: Normal work of breathing. Neurologic: No focal deficits.   Lab Results  Component  Value Date   CREATININE 1.10 (H) 04/16/2019   BUN 11 04/16/2019   NA 142 04/16/2019   K 4.5 04/16/2019   CL 103 04/16/2019   CO2 27 04/16/2019   Lab Results  Component Value Date   ALT 21 04/16/2019   AST 23 04/16/2019   ALKPHOS 58 04/16/2019    BILITOT 0.3 04/16/2019   Lab Results  Component Value Date   HGBA1C 7.9 (H) 04/16/2019   HGBA1C 6.9 11/25/2016   HGBA1C 6.0 06/10/2016   HGBA1C 5.9 02/12/2016   HGBA1C 6.1 12/01/2015   Lab Results  Component Value Date   INSULIN 12.3 04/16/2019   Lab Results  Component Value Date   TSH 1.780 04/16/2019   Lab Results  Component Value Date   CHOL 155 04/16/2019   HDL 84 04/16/2019   LDLCALC 57 04/16/2019   TRIG 71 04/16/2019   Lab Results  Component Value Date   WBC 6.8 04/16/2019   HGB 12.3 04/16/2019   HCT 36.1 04/16/2019   MCV 96 04/16/2019   PLT 274 04/16/2019   No results found for: IRON, TIBC, FERRITIN   Attestation Statements:   Reviewed by clinician on day of visit: allergies, medications, problem list, medical history, surgical history, family history, social history, and previous encounter notes.   Wilhemena Durie, am acting as Location manager for [employer], FNP-C.  I have reviewed the above documentation for accuracy and completeness, and I agree with the above. -  Georgianne Fick, FNP

## 2019-07-16 NOTE — Telephone Encounter (Signed)
Patient called and states she cannot do the Dexilant pills. Patient states her throat is too dry and cannot swallow it and same thing with the inhaler. Patient also wants to know if she has asthma or COPD.  Please advise.

## 2019-07-17 MED FILL — AMLODIPINE BESYLATE 5 MG TA: 5 | 90 days supply | Qty: 90 | Fill #0

## 2019-07-17 MED FILL — MYRBETRIQ ER 25 MG TABLET: 25 | 30 days supply | Qty: 30 | Fill #0

## 2019-07-17 NOTE — Telephone Encounter (Signed)
Tried calling pt back- no answer and not able to leave voicemail. Her last ov was 06/13/19- she started on Breztri and Dexilant at that appt. Need further information.

## 2019-07-18 NOTE — Telephone Encounter (Signed)
Pt called back and she believes that the dexilant is causing the dry mouth. She has not taken it today. Her throat is not sore. Her breathing is doing fine. I instructed her to stop dexilant for now but she can continuing taking her pepcid as instructed. She knows that Dr. Verlin Fester is out of clinic and will be back next Wednesday. Will send this to Dr. Verlin Fester and let her know if he has anything to add.

## 2019-07-24 NOTE — Telephone Encounter (Signed)
I spoke with pt and she is still having dry mouth "really bad". She will restart the dexilant and stop the [facility] for now. Please advise. Also she does rinse gargle and spit after use.

## 2019-07-24 NOTE — Telephone Encounter (Signed)
Will need to start Symbicort 160, 2 puffs twice a day via spacer. The Symbicort does not have the mouth drying medication in it that BrezTri does. Thanks.

## 2019-07-24 NOTE — Telephone Encounter (Signed)
Is the patient taking BrezTri?  If so, it is more likely this inhaler that is causing the dry mouth than the Dexilant. Let me know and we will make adjustments. Thanks.

## 2019-07-25 ENCOUNTER — Other Ambulatory Visit (HOSPITAL_BASED_OUTPATIENT_CLINIC_OR_DEPARTMENT_OTHER): Payer: Self-pay | Admitting: Urology

## 2019-07-25 ENCOUNTER — Other Ambulatory Visit: Payer: Self-pay

## 2019-07-25 MED ORDER — BUDESONIDE-FORMOTEROL FUMARATE 160-4.5 MCG/ACT IN AERO: INHALATION_SPRAY | RESPIRATORY_TRACT | 5 refills | Status: DC

## 2019-07-25 NOTE — Telephone Encounter (Signed)
Sent in rx and made pt aware of the change of medication

## 2019-07-29 ENCOUNTER — Encounter: Payer: Self-pay | Admitting: Family Medicine

## 2019-07-29 DIAGNOSIS — E042 Nontoxic multinodular goiter: Secondary | ICD-10-CM | POA: Diagnosis not present

## 2019-07-29 DIAGNOSIS — E119 Type 2 diabetes mellitus without complications: Secondary | ICD-10-CM | POA: Diagnosis not present

## 2019-07-29 DIAGNOSIS — I700 Atherosclerosis of aorta: Secondary | ICD-10-CM | POA: Diagnosis not present

## 2019-07-29 DIAGNOSIS — N1831 Chronic kidney disease, stage 3a: Secondary | ICD-10-CM | POA: Diagnosis not present

## 2019-07-30 ENCOUNTER — Other Ambulatory Visit: Payer: Self-pay

## 2019-07-30 ENCOUNTER — Ambulatory Visit (INDEPENDENT_AMBULATORY_CARE_PROVIDER_SITE_OTHER): Payer: 59 | Admitting: Family Medicine

## 2019-07-30 ENCOUNTER — Encounter (INDEPENDENT_AMBULATORY_CARE_PROVIDER_SITE_OTHER): Payer: Self-pay | Admitting: Family Medicine

## 2019-07-30 VITALS — BP 112/62 | HR 65 | Temp 97.9°F | Ht 66.0 in | Wt 237.0 lb

## 2019-07-30 DIAGNOSIS — Z6838 Body mass index (BMI) 38.0-38.9, adult: Secondary | ICD-10-CM

## 2019-07-30 DIAGNOSIS — Z794 Long term (current) use of insulin: Secondary | ICD-10-CM | POA: Diagnosis not present

## 2019-07-30 DIAGNOSIS — E1142 Type 2 diabetes mellitus with diabetic polyneuropathy: Secondary | ICD-10-CM | POA: Diagnosis not present

## 2019-07-30 MED FILL — SYMBICORT 160-4.5 MCG INH: 160-4.5 | 30 days supply | Qty: 10 | Fill #0

## 2019-07-30 MED FILL — CARBIDOPA-LEVODOPA 25-250 T: 25-250 | 90 days supply | Qty: 270 | Fill #1

## 2019-07-31 NOTE — Progress Notes (Signed)
Chief Complaint:   OBESITY Deanna Rowe is here to discuss her progress with her obesity treatment plan along with follow-up of her obesity related diagnoses. Deanna Rowe is on the Category 3 Plan and states she is following her eating plan approximately 50% of the time. Deanna Rowe states she is doing 0 minutes 0 times per week.  Today's visit was #: 8 Starting weight: 237 lbs Starting date: 04/16/2019 Today's weight: 237 lbs Today's date: 07/30/2019 Total lbs lost to date: 0 Total lbs lost since last in-office visit: 0  Interim History: Deanna Rowe is eating out about 2 times per week. She is generally off the plan when she eats out. She has been skipping lunch. She is sticking to the plan at breakfast. She is occasionally going over on extra calories.   Subjective:   1. Type 2 diabetes mellitus without complication, without long-term current use of insulin (Deanna Rowe) Deanna Rowe's diabetes mellitus is not well controlled. DM is managed by Deanna Rowe. She notes she had her A1c done yesterday but she does not have the results. Her fasting BGs range between 90 and 110, and 2 hour post prandial range between 150 and 190. She saw Deanna Rowe yesterday. She notes rare hypoglycemia on Humalog TID and Lantus. Lab Results  Component Value Date   HGBA1C 7.9 (H) 04/16/2019   HGBA1C 6.9 11/25/2016   HGBA1C 6.0 06/10/2016   Lab Results  Component Value Date   MICROALBUR <0.7 02/12/2016   LDLCALC 57 04/16/2019   CREATININE 1.10 (H) 04/16/2019     Assessment/Plan:   1. Type 2 diabetes mellitus without complication, without long-term current use of insulin (HCC) Good blood sugar control is important to decrease the likelihood of diabetic complications such as nephropathy, neuropathy, limb loss, blindness, coronary artery disease, and death. Intensive lifestyle modification including diet, exercise and weight loss are the first line of treatment for diabetes. Deanna Rowe will continue all of her medications and will  continue to see Deanna Rowe (follow up in 3 months).  2. Class 2 severe obesity with serious comorbidity and body mass index (BMI) of 38.0 to 38.9 in adult, unspecified obesity type (HCC) Deanna Rowe is currently in the action stage of change. As such, her goal is to continue with weight loss efforts. She has agreed to the Category 3 Plan.   Deanna Rowe is to reduce eating out to 1 time per week. She will bring labs to her next visit done by Deanna Rowe yesterday.  Exercise goals: No exercise has been prescribed at this time.  Behavioral modification strategies: increasing lean protein intake, decreasing simple carbohydrates, decreasing eating out and no skipping meals.  Deanna Rowe has agreed to follow-up with our clinic in 2 weeks. She was informed of the importance of frequent follow-up visits to maximize her success with intensive lifestyle modifications for her multiple health conditions.   Objective:   Blood pressure 112/62, pulse 65, temperature 97.9 F (36.6 C), temperature source Oral, height 5\' 6"  (1.676 m), weight 237 lb (107.5 kg), SpO2 98 %. Body mass index is 38.25 kg/m.  General: Cooperative, alert, well developed, in no acute distress. HEENT: Conjunctivae and lids unremarkable. Cardiovascular: Regular rhythm.  Lungs: Normal work of breathing. Neurologic: No focal deficits.   Lab Results  Component Value Date   CREATININE 1.10 (H) 04/16/2019   BUN 11 04/16/2019   NA 142 04/16/2019   K 4.5 04/16/2019   CL 103 04/16/2019   CO2 27 04/16/2019   Lab Results  Component Value Date  ALT 21 04/16/2019   AST 23 04/16/2019   ALKPHOS 58 04/16/2019   BILITOT 0.3 04/16/2019   Lab Results  Component Value Date   HGBA1C 7.9 (H) 04/16/2019   HGBA1C 6.9 11/25/2016   HGBA1C 6.0 06/10/2016   HGBA1C 5.9 02/12/2016   HGBA1C 6.1 12/01/2015   Lab Results  Component Value Date   INSULIN 12.3 04/16/2019   Lab Results  Component Value Date   TSH 1.780 04/16/2019   Lab Results  Component  Value Date   CHOL 155 04/16/2019   HDL 84 04/16/2019   LDLCALC 57 04/16/2019   TRIG 71 04/16/2019   Lab Results  Component Value Date   WBC 6.8 04/16/2019   HGB 12.3 04/16/2019   HCT 36.1 04/16/2019   MCV 96 04/16/2019   PLT 274 04/16/2019   No results found for: IRON, TIBC, FERRITIN  Attestation Statements:   Reviewed by clinician on day of visit: allergies, medications, problem list, medical history, surgical history, family history, social history, and previous encounter notes.   Deanna Rowe, am acting as Location manager for Deanna Schwab, FNP-C.  I have reviewed the above documentation for accuracy and completeness, and I agree with the above. -  Deanna Fick, FNP

## 2019-08-02 MED FILL — SOLIFENACIN SUCCINATE 10 MG: 10 | 30 days supply | Qty: 30 | Fill #2

## 2019-08-07 DIAGNOSIS — M25512 Pain in left shoulder: Secondary | ICD-10-CM | POA: Diagnosis not present

## 2019-08-08 ENCOUNTER — Other Ambulatory Visit: Payer: Self-pay | Admitting: Allergy and Immunology

## 2019-08-08 MED FILL — ATORVASTATIN 10 MG TABLET: 10 | 90 days supply | Qty: 90 | Fill #2

## 2019-08-09 MED FILL — MONTELUKAST SOD 10 MG TAB: 10 | 30 days supply | Qty: 30 | Fill #0

## 2019-08-09 MED FILL — LEVOCETIRIZINE 5 MG TABLET: 5 | 30 days supply | Qty: 30 | Fill #0

## 2019-08-12 ENCOUNTER — Ambulatory Visit (INDEPENDENT_AMBULATORY_CARE_PROVIDER_SITE_OTHER): Payer: 59 | Admitting: Family Medicine

## 2019-08-12 ENCOUNTER — Other Ambulatory Visit: Payer: Self-pay | Admitting: Allergy and Immunology

## 2019-08-12 ENCOUNTER — Other Ambulatory Visit: Payer: Self-pay

## 2019-08-12 ENCOUNTER — Encounter (INDEPENDENT_AMBULATORY_CARE_PROVIDER_SITE_OTHER): Payer: Self-pay | Admitting: Family Medicine

## 2019-08-12 VITALS — BP 128/71 | HR 64 | Temp 98.1°F | Ht 66.0 in | Wt 233.0 lb

## 2019-08-12 DIAGNOSIS — Z794 Long term (current) use of insulin: Secondary | ICD-10-CM

## 2019-08-12 DIAGNOSIS — Z6837 Body mass index (BMI) 37.0-37.9, adult: Secondary | ICD-10-CM

## 2019-08-12 DIAGNOSIS — E1165 Type 2 diabetes mellitus with hyperglycemia: Secondary | ICD-10-CM | POA: Diagnosis not present

## 2019-08-13 NOTE — Progress Notes (Signed)
Chief Complaint:   OBESITY Deanna Rowe is here to discuss her progress with her obesity treatment plan along with follow-up of her obesity related diagnoses. Deanna Rowe is on the Category 3 Plan and states she is following her eating plan approximately 50% of the time. Deanna Rowe states she is doing yard work for 120 minutes 3 times per week.  Today's visit was #: 9 Starting weight: 237 lbs Starting date: 04/16/2019 Today's weight: 233 lbs Today's date: 08/12/2019 Total lbs lost to date: 4 Total lbs lost since last in-office visit: 4  Interim History: Anjolina has stopped skipping lunch. She is now only eating out one time per week. Previously she ate 3-4 x per week. She does not eat all of the meat at dinner- she feels the portion is too large for her. .  Subjective:   1. Type 2 diabetes mellitus with hyperglycemia, with long-term current use of insulin (HCC) Deanna Rowe's last A1c was 7.9 three weeks ago. She is on Lantus and Humalog per Dr. Buddy Duty, endocrinologist. Her fasting BGs range between 90 and 120, and 2 hour post prandial range between 150 and 160. She denies hypoglycemia.   Assessment/Plan:   1. Type 2 diabetes mellitus with hyperglycemia, with long-term current use of insulin (HCC) Good blood sugar control is important to decrease the likelihood of diabetic complications such as nephropathy, neuropathy, limb loss, blindness, coronary artery disease, and death. Intensive lifestyle modification including diet, exercise and weight loss are the first line of treatment for diabetes. Deanna Rowe will continue to see Dr. Buddy Duty every 3 months.  2. Class 2 severe obesity with serious comorbidity and body mass index (BMI) of 37.0 to 37.9 in adult, unspecified obesity type (HCC) Deanna Rowe is currently in the action stage of change. As such, her goal is to continue with weight loss efforts. She has agreed to the Category 3 Plan.   Handout given today: Protein Equivalents. WE discussed how to use protein  exchanges to get in adequate protein if she does not eat 8-10 oz of meat at dinner.   Exercise goals: As is.  Behavioral modification strategies: increasing lean protein intake.  Deanna Rowe has agreed to follow-up with our clinic in 2 weeks. She was informed of the importance of frequent follow-up visits to maximize her success with intensive lifestyle modifications for her multiple health conditions.   Objective:   Pulse 64, temperature 98.1 F (36.7 C), temperature source Oral, height 5\' 6"  (1.676 m), weight 233 lb (105.7 kg), SpO2 96 %. Body mass index is 37.61 kg/m.  General: Cooperative, alert, well developed, in no acute distress. HEENT: Conjunctivae and lids unremarkable. Cardiovascular: Regular rhythm.  Lungs: Normal work of breathing. Neurologic: No focal deficits.   Lab Results  Component Value Date   CREATININE 1.10 (H) 04/16/2019   BUN 11 04/16/2019   NA 142 04/16/2019   K 4.5 04/16/2019   CL 103 04/16/2019   CO2 27 04/16/2019   Lab Results  Component Value Date   ALT 21 04/16/2019   AST 23 04/16/2019   ALKPHOS 58 04/16/2019   BILITOT 0.3 04/16/2019   Lab Results  Component Value Date   HGBA1C 7.9 (H) 04/16/2019   HGBA1C 6.9 11/25/2016   HGBA1C 6.0 06/10/2016   HGBA1C 5.9 02/12/2016   HGBA1C 6.1 12/01/2015   Lab Results  Component Value Date   INSULIN 12.3 04/16/2019   Lab Results  Component Value Date   TSH 1.780 04/16/2019   Lab Results  Component Value Date  CHOL 155 04/16/2019   HDL 84 04/16/2019   LDLCALC 57 04/16/2019   TRIG 71 04/16/2019   Lab Results  Component Value Date   WBC 6.8 04/16/2019   HGB 12.3 04/16/2019   HCT 36.1 04/16/2019   MCV 96 04/16/2019   PLT 274 04/16/2019   No results found for: IRON, TIBC, FERRITIN  Attestation Statements:   Reviewed by clinician on day of visit: allergies, medications, problem list, medical history, surgical history, family history, social history, and previous encounter notes.   Wilhemena Durie, am acting as Location manager for [employer], FNP-C.  I have reviewed the above documentation for accuracy and completeness, and I agree with the above. -  Georgianne Fick, FNP

## 2019-08-15 ENCOUNTER — Telehealth: Payer: Self-pay | Admitting: Allergy and Immunology

## 2019-08-15 ENCOUNTER — Ambulatory Visit: Payer: 59 | Admitting: Allergy and Immunology

## 2019-08-15 DIAGNOSIS — R0982 Postnasal drip: Secondary | ICD-10-CM | POA: Diagnosis not present

## 2019-08-15 DIAGNOSIS — J302 Other seasonal allergic rhinitis: Secondary | ICD-10-CM | POA: Diagnosis not present

## 2019-08-15 DIAGNOSIS — R05 Cough: Secondary | ICD-10-CM | POA: Diagnosis not present

## 2019-08-15 NOTE — Telephone Encounter (Signed)
Can you please ask when the cough started and if it is productive. Does she have a fever? What is she taking now? Thank you so much

## 2019-08-15 NOTE — Telephone Encounter (Signed)
Patient states she has a cough that is worse at night and would like to know what she can take to help.

## 2019-08-15 NOTE — Telephone Encounter (Signed)
Tried calling pt back voicemail box was full unable to leave message

## 2019-08-16 NOTE — Telephone Encounter (Signed)
Cough has been going on for a week, no fever, non productive cough. Dylsum is not helping. Tried several other cough meds not sure of names and they did not help. Nothing taken today

## 2019-08-16 NOTE — Telephone Encounter (Signed)
Please have her continue Dexilant once a day. Is she still taking Dexilant? Can you please have her begin azelastine 2 sprays in each nostril twice a day and Nasacort 1-2 sprays in each nostril once a day. Also nasal saline rinses once a day please. Please have her call the clinic if her symptoms worsen of do not improve. If not better can we please see her in the clinic on Monday.

## 2019-08-16 NOTE — Telephone Encounter (Signed)
Is she taking montelukast, Symbicort 160-2 puffs twice a day with a spacer, and how often is she using her albuterol? What are her daily average blood sugar range? Is she using nasal sprays or rinses? Thank you

## 2019-08-16 NOTE — Telephone Encounter (Signed)
Taking montelukast, and symbicort 2 puffs bid hardly uses albuterol. Checks blood sugars 3 times a day average is 130 after meals. She is not doing nasal sprays or nasal rinses.

## 2019-08-19 NOTE — Telephone Encounter (Signed)
Pt is on dexilant and believes it is not helping she does not want the azelastine as she  is out of town this week. I told her to call if the cough gets bad and we can send in something for her at the pharmacy near her. She stated understanding

## 2019-08-19 NOTE — Telephone Encounter (Signed)
Same she stated

## 2019-08-19 NOTE — Telephone Encounter (Signed)
Is her cough getting worse or improving?

## 2019-08-20 NOTE — Telephone Encounter (Signed)
We should probably see this patient in the clinic since she is not getting better

## 2019-08-22 NOTE — Telephone Encounter (Signed)
Pt called back to speak to carrie. 

## 2019-08-22 NOTE — Telephone Encounter (Signed)
Thank you :)

## 2019-08-22 NOTE — Telephone Encounter (Signed)
Can you please call this patient? Thank you

## 2019-08-22 NOTE — Telephone Encounter (Signed)
June 10th 10am

## 2019-08-22 NOTE — Telephone Encounter (Signed)
Tried calling pt voice mailbox is full

## 2019-08-26 ENCOUNTER — Encounter (INDEPENDENT_AMBULATORY_CARE_PROVIDER_SITE_OTHER): Payer: Self-pay | Admitting: Family Medicine

## 2019-08-26 ENCOUNTER — Other Ambulatory Visit: Payer: Self-pay

## 2019-08-26 ENCOUNTER — Ambulatory Visit (INDEPENDENT_AMBULATORY_CARE_PROVIDER_SITE_OTHER): Payer: 59 | Admitting: Family Medicine

## 2019-08-26 VITALS — BP 119/64 | HR 58 | Temp 98.0°F | Ht 66.0 in | Wt 234.0 lb

## 2019-08-26 DIAGNOSIS — E1165 Type 2 diabetes mellitus with hyperglycemia: Secondary | ICD-10-CM | POA: Diagnosis not present

## 2019-08-26 DIAGNOSIS — Z794 Long term (current) use of insulin: Secondary | ICD-10-CM

## 2019-08-26 DIAGNOSIS — Z6837 Body mass index (BMI) 37.0-37.9, adult: Secondary | ICD-10-CM

## 2019-08-27 ENCOUNTER — Encounter (INDEPENDENT_AMBULATORY_CARE_PROVIDER_SITE_OTHER): Payer: Self-pay | Admitting: Family Medicine

## 2019-08-27 NOTE — Progress Notes (Signed)
Chief Complaint:   OBESITY Deanna Rowe is here to discuss her progress with her obesity treatment plan along with follow-up of her obesity related diagnoses. Deanna Rowe is on the Category 3 Plan and states she is following her eating plan approximately 50% of the time. Deanna Rowe states she is doing 0 minutes 0 times per week.  Today's visit was #: 10 Starting weight: 237 lbs Starting date: 04/16/2019 Today's weight: 234 lbs Today's date: 08/26/2019 Total lbs lost to date: 3 Total lbs lost since last in-office visit: 0  Interim History: Deanna Rowe is eating out 2 times per week (up from once weekly) and is using portion control. She notes that she does not feel that eating out only once weekly will work foe her and her husband long-term. She doesn't like to cook daily. She is not skipping meals, and breakfast and lunch are on the plan. She used to exercise at [facility] and loved it but the program is closed.  Subjective:   1. Type 2 diabetes mellitus with hyperglycemia, with long-term current use of insulin (HCC) DM is not well controlled. Deanna Rowe's Last A1c was 7.9 on 07/29/2019. Her fasting BGs range between 110 and 120, and 2 hour post prandial ranges at 200. Her diabetes mellitus is managed by Dr. Buddy Duty. She denies hypoglycemia.  Lab Results  Component Value Date   HGBA1C 7.9 (H) 04/16/2019   HGBA1C 6.9 11/25/2016   HGBA1C 6.0 06/10/2016   Lab Results  Component Value Date   MICROALBUR <0.7 02/12/2016   LDLCALC 57 04/16/2019   CREATININE 1.10 (H) 04/16/2019   Lab Results  Component Value Date   INSULIN 12.3 04/16/2019   Assessment/Plan:   1. Type 2 diabetes mellitus with hyperglycemia, with long-term current use of insulin (HCC) Good blood sugar control is important to decrease the likelihood of diabetic complications such as nephropathy, neuropathy, limb loss, blindness, coronary artery disease, and death. Intensive lifestyle modification including diet, exercise and weight loss are the  first line of treatment for diabetes. Deanna Rowe will continue all of her medications, and will follow up as directed.  2. Class 2 severe obesity with serious comorbidity and body mass index (BMI) of 37.0 to 37.9 in adult, unspecified obesity type (HCC) Deanna Rowe is currently in the action stage of change. As such, her goal is to continue with weight loss efforts. She has agreed to the Category 3 Plan.   Deanna Rowe may eat out 2 times per week. We looked at menus online for her usual restaurants and discussed her best food choices.   Exercise goals: Adults should also include muscle-strengthening activities that involve all major muscle groups on 2 or more days a week.  Behavioral modification strategies: increasing lean protein intake, decreasing simple carbohydrates, decreasing eating out and meal planning and cooking strategies.  Deanna Rowe has agreed to follow-up with our clinic in 2 weeks. She was informed of the importance of frequent follow-up visits to maximize her success with intensive lifestyle modifications for her multiple health conditions.   Objective:   Blood pressure 119/64, pulse (!) 58, temperature 98 F (36.7 C), temperature source Oral, height 5\' 6"  (1.676 m), weight 234 lb (106.1 kg), SpO2 98 %. Body mass index is 37.77 kg/m.  General: Cooperative, alert, well developed, in no acute distress. HEENT: Conjunctivae and lids unremarkable. Cardiovascular: Regular rhythm.  Lungs: Normal work of breathing. Neurologic: No focal deficits.   Lab Results  Component Value Date   CREATININE 1.10 (H) 04/16/2019   BUN 11 04/16/2019  NA 142 04/16/2019   K 4.5 04/16/2019   CL 103 04/16/2019   CO2 27 04/16/2019   Lab Results  Component Value Date   ALT 21 04/16/2019   AST 23 04/16/2019   ALKPHOS 58 04/16/2019   BILITOT 0.3 04/16/2019   Lab Results  Component Value Date   HGBA1C 7.9 (H) 04/16/2019   HGBA1C 6.9 11/25/2016   HGBA1C 6.0 06/10/2016   HGBA1C 5.9 02/12/2016    HGBA1C 6.1 12/01/2015   Lab Results  Component Value Date   INSULIN 12.3 04/16/2019   Lab Results  Component Value Date   TSH 1.780 04/16/2019   Lab Results  Component Value Date   CHOL 155 04/16/2019   HDL 84 04/16/2019   LDLCALC 57 04/16/2019   TRIG 71 04/16/2019   Lab Results  Component Value Date   WBC 6.8 04/16/2019   HGB 12.3 04/16/2019   HCT 36.1 04/16/2019   MCV 96 04/16/2019   PLT 274 04/16/2019   No results found for: IRON, TIBC, FERRITIN  Obesity Behavioral Intervention Documentation for Insurance:   Approximately 15 minutes were spent on the discussion below.  ASK: We discussed the diagnosis of obesity with Deanna Rowe today and Deanna Rowe agreed to give [name] permission to discuss obesity behavioral modification therapy today.  ASSESS: Deanna Rowe has the diagnosis of obesity and her BMI today is 37.79. Deanna Rowe is in the action stage of change.   ADVISE: Deanna Rowe was educated on the multiple health risks of obesity as well as the benefit of weight loss to improve her health. She was advised of the need for long term treatment and the importance of lifestyle modifications to improve her current health and to decrease her risk of future health problems.  AGREE: Multiple dietary modification options and treatment options were discussed and Deanna Rowe agreed to follow the recommendations documented in the above note.  ARRANGE: Deanna Rowe was educated on the importance of frequent visits to treat obesity as outlined per CMS and USPSTF guidelines and agreed to schedule her next follow up appointment today.  Attestation Statements:   Reviewed by clinician on day of visit: allergies, medications, problem list, medical history, surgical history, family history, social history, and previous encounter notes.   Wilhemena Durie, am acting as Location manager for [employer], FNP-C.  I have reviewed the above documentation for accuracy and completeness, and I agree with the above. -   Georgianne Fick, FNP

## 2019-09-03 MED FILL — rOPINIRole HCL 2 MG TABS: 2 | 90 days supply | Qty: 180 | Fill #0

## 2019-09-04 ENCOUNTER — Other Ambulatory Visit: Payer: Self-pay | Admitting: Cardiology

## 2019-09-04 MED FILL — LEVOCETIRIZINE 5 MG TABLET: 5 | 30 days supply | Qty: 30 | Fill #1

## 2019-09-04 MED FILL — SOLIFENACIN SUCCINATE 10 MG: 10 | 30 days supply | Qty: 30 | Fill #3

## 2019-09-04 MED FILL — GABAPENTIN 300 MG CAPSULE: 300 | 90 days supply | Qty: 180 | Fill #0

## 2019-09-04 MED FILL — MONTELUKAST SOD 10 MG TAB: 10 | 30 days supply | Qty: 30 | Fill #1

## 2019-09-04 MED FILL — ZOLPIDEM TARTRATE 5 MG TAB: 5 | 90 days supply | Qty: 90 | Fill #1

## 2019-09-05 ENCOUNTER — Other Ambulatory Visit: Payer: Self-pay | Admitting: Interventional Cardiology

## 2019-09-05 MED FILL — PROPRANOLOL 40 MG TABLET: 40 | 90 days supply | Qty: 180 | Fill #0

## 2019-09-11 ENCOUNTER — Other Ambulatory Visit: Payer: Self-pay

## 2019-09-11 ENCOUNTER — Encounter (INDEPENDENT_AMBULATORY_CARE_PROVIDER_SITE_OTHER): Payer: Self-pay | Admitting: Family Medicine

## 2019-09-11 ENCOUNTER — Ambulatory Visit (INDEPENDENT_AMBULATORY_CARE_PROVIDER_SITE_OTHER): Payer: 59 | Admitting: Family Medicine

## 2019-09-11 VITALS — BP 106/65 | HR 62 | Temp 98.0°F | Ht 66.0 in | Wt 236.0 lb

## 2019-09-11 DIAGNOSIS — Z794 Long term (current) use of insulin: Secondary | ICD-10-CM

## 2019-09-11 DIAGNOSIS — Z6838 Body mass index (BMI) 38.0-38.9, adult: Secondary | ICD-10-CM | POA: Diagnosis not present

## 2019-09-11 DIAGNOSIS — E1142 Type 2 diabetes mellitus with diabetic polyneuropathy: Secondary | ICD-10-CM

## 2019-09-11 DIAGNOSIS — Z9189 Other specified personal risk factors, not elsewhere classified: Secondary | ICD-10-CM | POA: Diagnosis not present

## 2019-09-12 NOTE — Progress Notes (Signed)
Chief Complaint:   OBESITY Deanna Rowe is here to discuss her progress with her obesity treatment plan along with follow-up of her obesity related diagnoses. Deanna Rowe is on the Category 3 Plan and states she is following her eating plan approximately 50% of the time. Deanna Rowe states she is doing 0 minutes 0 times per week.  Today's visit was #: 11 Starting weight: 237 lbs Starting date: 04/16/2019 Today's weight: 236 lbs Today's date: 09/11/2019 Total lbs lost to date: 1 Total lbs lost since last in-office visit: 0  Interim History: Deanna Rowe is eating out only one time per week. She is only having 6 oz of meat at supper. She wakes up and snacks at night 3 times per week (cookies and milk).  Subjective:   1. Type 2 diabetes mellitus without complication, with long-term current use of insulin (Deanna Rowe) Deanna Rowe's last A1c was 7.9 on 07/29/2019. Her next office visit is in 3 weeks with Dr. Buddy Duty. Her fasting BGs range between 90 and 112, and 2 hour post prandial range between 134 and 144. She notes she has had 1 instance of hypoglycemia between 3 and 4 pm (56). She has not had hypoglycemia after breakfast but she is concerned that she might because Dr. Buddy Duty has told her to take lantis and Humalog before breakfast. .   Lab Results  Component Value Date   HGBA1C 7.9 (H) 04/16/2019   HGBA1C 6.9 11/25/2016   HGBA1C 6.0 06/10/2016   Lab Results  Component Value Date   MICROALBUR <0.7 02/12/2016   LDLCALC 57 04/16/2019   CREATININE 1.10 (H) 04/16/2019   Lab Results  Component Value Date   INSULIN 12.3 04/16/2019   2. At risk for medication nonadherence Deanna Rowe is at a higher than average risk for medication non-adherence due concern for possible hypoglycemia from taking insulin before breakfast.  Assessment/Plan:   1. Type 2 diabetes mellitus without complication, with long-term current use of insulin (Deanna Rowe) . Deanna Rowe will continue to follow up with Dr. Buddy Duty and take insulin as ordered.  .  2. At risk for medication nonadherence Deanna Rowe was given approximately 15 minutes of counseling today to help her avoid medication non-adherence.  We discussed importance of taking insulin as ordered.  Repetitive spaced learning was employed today to elicit superior memory formation and behavioral change.  3. Class 2 severe obesity with serious comorbidity and body mass index (BMI) of 38.0 to 38.9 in adult, unspecified obesity type (HCC) Deanna Rowe is currently in the action stage of change. As such, her goal is to continue with weight loss efforts. She has agreed to the Category 3 Plan.   Deanna Rowe may have a bedtime snack of Protein One bar and Deanna Rowe milk.  Exercise goals: All adults should avoid inactivity. Some physical activity is better than none, and adults who participate in any amount of physical activity gain some health benefits.  Behavioral modification strategies: increasing lean protein intake and decreasing simple carbohydrates.  Deanna Rowe has agreed to follow-up with our clinic in 3 weeks. She was informed of the importance of frequent follow-up visits to maximize her success with intensive lifestyle modifications for her multiple health conditions.   Objective:   Blood pressure 106/65, pulse 62, temperature 98 F (36.7 C), temperature source Oral, height 5\' 6"  (1.676 m), weight 236 lb (107 kg), SpO2 98 %. Body mass index is 38.09 kg/m.  General: Cooperative, alert, well developed, in no acute distress. HEENT: Conjunctivae and lids unremarkable. Cardiovascular: Regular rhythm.  Lungs: Normal work of  breathing. Neurologic: No focal deficits.   Lab Results  Component Value Date   CREATININE 1.10 (H) 04/16/2019   BUN 11 04/16/2019   NA 142 04/16/2019   K 4.5 04/16/2019   CL 103 04/16/2019   CO2 27 04/16/2019   Lab Results  Component Value Date   ALT 21 04/16/2019   AST 23 04/16/2019   ALKPHOS 58 04/16/2019   BILITOT 0.3 04/16/2019   Lab Results  Component  Value Date   HGBA1C 7.9 (H) 04/16/2019   HGBA1C 6.9 11/25/2016   HGBA1C 6.0 06/10/2016   HGBA1C 5.9 02/12/2016   HGBA1C 6.1 12/01/2015   Lab Results  Component Value Date   INSULIN 12.3 04/16/2019   Lab Results  Component Value Date   TSH 1.780 04/16/2019   Lab Results  Component Value Date   CHOL 155 04/16/2019   HDL 84 04/16/2019   LDLCALC 57 04/16/2019   TRIG 71 04/16/2019   Lab Results  Component Value Date   WBC 6.8 04/16/2019   HGB 12.3 04/16/2019   HCT 36.1 04/16/2019   MCV 96 04/16/2019   PLT 274 04/16/2019   No results found for: IRON, TIBC, FERRITIN  Attestation Statements:   Reviewed by clinician on day of visit: allergies, medications, problem list, medical history, surgical history, family history, social history, and previous encounter notes.   Wilhemena Durie, am acting as Location manager for [employer], FNP-C.  I have reviewed the above documentation for accuracy and completeness, and I agree with the above. -  Georgianne Fick, FNP

## 2019-09-17 ENCOUNTER — Ambulatory Visit: Payer: Self-pay | Admitting: Family Medicine

## 2019-09-19 ENCOUNTER — Ambulatory Visit: Payer: Self-pay | Admitting: Family Medicine

## 2019-09-24 ENCOUNTER — Ambulatory Visit (INDEPENDENT_AMBULATORY_CARE_PROVIDER_SITE_OTHER): Payer: 59 | Admitting: Family Medicine

## 2019-09-24 ENCOUNTER — Encounter: Payer: Self-pay | Admitting: Family Medicine

## 2019-09-24 ENCOUNTER — Telehealth: Payer: Self-pay | Admitting: Family Medicine

## 2019-09-24 VITALS — BP 118/70 | HR 80 | Temp 97.7°F | Resp 18

## 2019-09-24 DIAGNOSIS — J3089 Other allergic rhinitis: Secondary | ICD-10-CM | POA: Diagnosis not present

## 2019-09-24 DIAGNOSIS — H1010 Acute atopic conjunctivitis, unspecified eye: Secondary | ICD-10-CM

## 2019-09-24 DIAGNOSIS — K219 Gastro-esophageal reflux disease without esophagitis: Secondary | ICD-10-CM | POA: Diagnosis not present

## 2019-09-24 DIAGNOSIS — R05 Cough: Secondary | ICD-10-CM

## 2019-09-24 DIAGNOSIS — J4541 Moderate persistent asthma with (acute) exacerbation: Secondary | ICD-10-CM | POA: Insufficient documentation

## 2019-09-24 DIAGNOSIS — J4540 Moderate persistent asthma, uncomplicated: Secondary | ICD-10-CM

## 2019-09-24 DIAGNOSIS — R053 Chronic cough: Secondary | ICD-10-CM

## 2019-09-24 MED ORDER — FLUTICASONE PROPIONATE 50 MCG/ACT NA SUSP: 2.0000 | Freq: Every day | NASAL | 5 refills | Status: DC

## 2019-09-24 MED FILL — FLUTICASONE PROP 50 MCG SPR: 50 | 30 days supply | Qty: 16 | Fill #0

## 2019-09-24 NOTE — Patient Instructions (Addendum)
Asthma Continue montelukast 10 mg once a day to prevent cough or wheeze Continue Symbicort 160-2 puffs twice a day with a spacer to prevent cough or wheeze Continue albuterol 2 puffs every 4 hours as needed for cough or wheeze For asthma flare, add Flovent 220-2 puffs twice a day for 2 weeks or until cough and wheeze free.   Allergic rhinitis Begin Nasacort 2 sprays in each nostril once a day Begin nasal saline rinses twice a day Begin Mucinex [id number] mg twice a day to this secretions Continue azelastine nasal spray 2 sprays in each nostril twice a day as needed for nasal symptoms Continue Xyzal 5 mg once a day only as needed for a runny nose or itch Continue allergen avoidance measures directed toward dust mites, cat, and dog as listed below  Reflux Continue Dexilant 60 mg once a day to control reflux. Continue dietary and lifestyle modifications as listed below Recommend follow up with gastroenterologist for further evaluation  Cough Continue Tessalon Perles up to three times a day as needed  Allergic conjunctivitis Some over the counter eye drops include Pataday one drop in each eye once a day as needed for red, itchy eyes OR Zaditor one drop in each eye twice a day as needed for red itchy eyes.  Call the clinic if this treatment plan is not working well for you  Follow up in 4 weeks or sooner if needed.   Lifestyle Changes for Controlling GERD When you have GERD, stomach acid feels as if it's backing up toward your mouth. Whether or not you take medication to control your GERD, your symptoms can often be improved with lifestyle changes.   Raise Your Head  Reflux is more likely to strike when you're lying down flat, because stomach fluid can  flow backward more easily. Raising the head of your bed 4-6 inches can help. To do this:  Slide blocks or books under the legs at the head of your bed. Or, place a wedge under  the mattress. Many foam stores can make a suitable  wedge for you. The wedge  should run from your waist to the top of your head.  Don't just prop your head on several pillows. This increases pressure on your  stomach. It can make GERD worse.  Watch Your Eating Habits Certain foods may increase the acid in your stomach or relax the lower esophageal sphincter, making GERD more likely. It's best to avoid the following:  Coffee, tea, and carbonated drinks (with and without caffeine)  Fatty, fried, or spicy food  Mint, chocolate, onions, and tomatoes  Any other foods that seem to irritate your stomach or cause you pain  Relieve the Pressure  Eat smaller meals, even if you have to eat more often.  Don't lie down right after you eat. Wait a few hours for your stomach to empty.  Avoid tight belts and tight-fitting clothes.  Lose excess weight.  Tobacco and Alcohol  Control of Dog or Cat Allergen Avoidance is the best way to manage a dog or cat allergy. If you have a dog or cat and are allergic to dog or cats, consider removing the dog or cat from the home. If you have a dog or cat but don't want to find it a new home, or if your family wants a pet even though someone in the household is allergic, here are some strategies that may help keep symptoms at bay:  1. Keep the pet out of your bedroom and  restrict it to only a few rooms. Be advised that keeping the dog or cat in only one room will not limit the allergens to that room. 2. Don't pet, hug or kiss the dog or cat; if you do, wash your hands with soap and water. 3. High-efficiency particulate air (HEPA) cleaners run continuously in a bedroom or living room can reduce allergen levels over time. 4. Regular use of a high-efficiency vacuum cleaner or a central vacuum can reduce allergen levels. 5. Giving your dog or cat a bath at least once a week can reduce airborne allergen.   Control of Dust Mite Allergen Dust mites play a major role in allergic asthma and rhinitis. They occur in  environments with high humidity wherever human skin is found. Dust mites absorb humidity from the atmosphere (ie, they do not drink) and feed on organic matter (including shed human and animal skin). Dust mites are a microscopic type of insect that you cannot see with the naked eye. High levels of dust mites have been detected from mattresses, pillows, carpets, upholstered furniture, bed covers, clothes, soft toys and any woven material. The principal allergen of the dust mite is found in its feces. A gram of dust may contain 1,000 mites and 250,000 fecal particles. Mite antigen is easily measured in the air during house cleaning activities. Dust mites do not bite and do not cause harm to humans, other than by triggering allergies/asthma.  Ways to decrease your exposure to dust mites in your home:  1. Encase mattresses, box springs and pillows with a mite-impermeable barrier or cover  2. Wash sheets, blankets and drapes weekly in hot water (130 F) with detergent and dry them in a dryer on the hot setting.  3. Have the room cleaned frequently with a vacuum cleaner and a damp dust-mop. For carpeting or rugs, vacuuming with a vacuum cleaner equipped with a high-efficiency particulate air (HEPA) filter. The dust mite allergic individual should not be in a room which is being cleaned and should wait 1 hour after cleaning before going into the room.  4. Do not sleep on upholstered furniture (eg, couches).  5. If possible removing carpeting, upholstered furniture and drapery from the home is ideal. Horizontal blinds should be eliminated in the rooms where the person spends the most time (bedroom, study, television room). Washable vinyl, roller-type shades are optimal.  6. Remove all non-washable stuffed toys from the bedroom. Wash stuffed toys weekly like sheets and blankets above.  7. Reduce indoor humidity to less than 50%. Inexpensive humidity monitors can be purchased at most hardware stores. Do not  use a humidifier as can make the problem worse and are not recommended.    Avoid smoking tobacco and drinking alcohol. They can make GERD symptoms worse.

## 2019-09-24 NOTE — Progress Notes (Signed)
[address] [place] [address] Dept: [phone]  FOLLOW UP NOTE  Patient ID: Deanna Rowe, female    DOB: 08-Dec-1955  Age: 64 y.o. MRN: [id number] Date of Office Visit: 09/24/2019  Assessment  Chief Complaint: Cough (worse at night), Medication Problem (Breztri made patient feel like throat was closing up.  Was not able to tolerate.), and Medication Management (patient was given Tessalon Pearles by Urgent Care.  No help with cough.  D/C'd Tessalon.)  HPI Satonya Brandyce Dimario is a 64 year old female who presents tot erh clinic for a follow up visit. She was last seen in this clinic on 06/13/2019 by Dr. Verlin Fester for evaluation of asthma, allergic rhinitis, allergic conjunctivitis, cough and reflux.  At that time, she received a prednisone taper with moderate relief of symptoms.  At today's visit she reports that she continues to experience cough producing clear thick mucus which is causing frequent post tussive vomiting for the last 4 weeks.  She has recently been to urgent care and was prescribed Tessalon Perles with no benefit.  She has also tried Robitussin cough syrup and Delsym cough syrup with no benefit.  She reports the cough is worse at night while lying down.  She denies heartburn and continues Dexilant 60 mg once a day.  She reports no change in her cough or vomiting since beginning Dexilant.  Asthma is reported as moderately well controlled with shortness of breath when walking upstairs or cooking, wheezing occurring mostly at night, and coughing occurring " all the time".  She continues montelukast 10 mg once a day, Symbicort 2 puffs twice a day with a spacer, and Flovent 222 puffs twice a day with a spacer in addition to albuterol once a day.  She reports that albuterol helps a little.  She does report that she used Breztri inhaler 1 time and felt as though her throat was closing.  Her symptoms resolved and she has stopped using the [employer] inhaler.  Her current  medications are listed in the chart.  Drug Allergies:  Allergies  Allergen Reactions  . Fluticasone-Salmeterol Anaphylaxis  . Codeine Nausea And Vomiting  . Fetzima [Levomilnacipran] Nausea And Vomiting  . Nsaids Other (See Comments)    Upset stomach   . Trulicity [Dulaglutide] Nausea And Vomiting    Significant and undesirably rapid (40 lbs) weight loss   . Xanax [Alprazolam] Other (See Comments)    disoriented  . Amoxicillin Rash    Has patient had a PCN reaction causing immediate rash, facial/tongue/throat swelling, SOB or lightheadedness with hypotension: Yes Has patient had a PCN reaction causing severe rash involving mucus membranes or skin necrosis: No Has patient had a PCN reaction that required hospitalization: No Has patient had a PCN reaction occurring within the last 10 years: No If all of the above answers are "NO", then may proceed with Cephalosporin use.   . Pseudoephedrine Hcl Er Palpitations    Physical Exam: BP 118/70 (BP Location: Right Arm, Patient Position: Sitting, Cuff Size: Large)   Pulse 80   Temp 97.7 F (36.5 C) (Oral)   Resp 18   SpO2 95%    Physical Exam Vitals reviewed.  Constitutional:      Appearance: Normal appearance.  HENT:     Head: Normocephalic and atraumatic.     Right Ear: Tympanic membrane normal.     Left Ear: Tympanic membrane normal.     Nose:     Comments: Bilateral nares slightly erythematous with clear nasal drainage  noted.  Pharynx slightly erythematous with no exudate.  Ears normal.  Eyes normal. Eyes:     Conjunctiva/sclera: Conjunctivae normal.  Cardiovascular:     Rate and Rhythm: Normal rate and regular rhythm.     Heart sounds: Normal heart sounds. No murmur heard.   Pulmonary:     Effort: Pulmonary effort is normal.     Breath sounds: Normal breath sounds.     Comments: Lungs clear to auscultation Musculoskeletal:        General: Normal range of motion.     Cervical back: Normal range of motion and neck  supple.  Skin:    General: Skin is warm and dry.  Neurological:     Mental Status: She is alert and oriented to person, place, and time.  Psychiatric:        Mood and Affect: Mood normal.        Behavior: Behavior normal.        Thought Content: Thought content normal.        Judgment: Judgment normal.     Diagnostics: FVC 2.08, FEV1 1.72.  Predicted FVC 2.79, predicted FEV1 2.19.  Spirometry indicates mild restriction.  This is consistent with previous spirometry readings.  Assessment and Plan: 1. Moderate persistent asthma without complication   2. Perennial allergic rhinitis   3. Gastroesophageal reflux disease, unspecified whether esophagitis present   4. Cough, persistent   5. Seasonal allergic conjunctivitis     Meds ordered this encounter  Medications  . fluticasone (FLONASE) 50 MCG/ACT nasal spray    Sig: Place 2 sprays into both nostrils daily.    Dispense:  1 g    Refill:  5    Patient Instructions  Asthma Continue montelukast 10 mg once a day to prevent cough or wheeze Continue Symbicort 160-2 puffs twice a day with a spacer to prevent cough or wheeze Continue albuterol 2 puffs every 4 hours as needed for cough or wheeze For asthma flare, add Flovent 220-2 puffs twice a day for 2 weeks or until cough and wheeze free.   Allergic rhinitis Begin Flonase 2 sprays in each nostril once a day Begin nasal saline rinses twice a day Begin Mucinex [phone] mg twice a day to this secretions Continue azelastine nasal spray 2 sprays in each nostril twice a day as needed for nasal symptoms Continue Xyzal 5 mg once a day only as needed for a runny nose or itch Continue allergen avoidance measures directed toward dust mites, cat, and dog as listed below  Reflux Continue Dexilant 60 mg once a day to control reflux. Continue dietary and lifestyle modifications as listed below Recommend follow up with gastroenterologist for further evaluation  Cough Continue Tessalon  Perles up to three times a day as needed  Allergic conjunctivitis Some over the counter eye drops include Pataday one drop in each eye once a day as needed for red, itchy eyes OR Zaditor one drop in each eye twice a day as needed for red itchy eyes.  Call the clinic if this treatment plan is not working well for you  Follow up in 4 weeks or sooner if needed.  Return in about 4 weeks (around 10/22/2019), or if symptoms worsen or fail to improve.    Thank you for the opportunity to care for this patient.  Please do not hesitate to contact me with questions.  Gareth Morgan, FNP [facility]

## 2019-09-24 NOTE — Telephone Encounter (Signed)
Patient reports that she has used Flonase recently with no adverse reaction. She will call the clinic with any questions.

## 2019-09-26 ENCOUNTER — Other Ambulatory Visit (HOSPITAL_BASED_OUTPATIENT_CLINIC_OR_DEPARTMENT_OTHER): Payer: Self-pay | Admitting: Internal Medicine

## 2019-09-26 MED FILL — TECHLITE PEN NDL 32GX1/4: 32G X 6 MM | 90 days supply | Qty: 400 | Fill #0

## 2019-09-26 MED FILL — REXULTI 2 MG TABLET: 2 | 90 days supply | Qty: 90 | Fill #1

## 2019-09-30 MED FILL — SOLIFENACIN SUCCINATE 10 MG: 10 | 30 days supply | Qty: 30 | Fill #4

## 2019-10-02 ENCOUNTER — Other Ambulatory Visit: Payer: Self-pay

## 2019-10-02 ENCOUNTER — Ambulatory Visit (INDEPENDENT_AMBULATORY_CARE_PROVIDER_SITE_OTHER): Payer: 59 | Admitting: Family Medicine

## 2019-10-02 ENCOUNTER — Encounter (INDEPENDENT_AMBULATORY_CARE_PROVIDER_SITE_OTHER): Payer: Self-pay | Admitting: Family Medicine

## 2019-10-02 VITALS — BP 116/57 | HR 66 | Temp 98.1°F | Ht 66.0 in | Wt 240.0 lb

## 2019-10-02 DIAGNOSIS — Z6838 Body mass index (BMI) 38.0-38.9, adult: Secondary | ICD-10-CM | POA: Diagnosis not present

## 2019-10-02 DIAGNOSIS — Z794 Long term (current) use of insulin: Secondary | ICD-10-CM

## 2019-10-02 DIAGNOSIS — E1142 Type 2 diabetes mellitus with diabetic polyneuropathy: Secondary | ICD-10-CM

## 2019-10-07 ENCOUNTER — Ambulatory Visit: Payer: 59 | Admitting: Allergy and Immunology

## 2019-10-08 ENCOUNTER — Encounter (INDEPENDENT_AMBULATORY_CARE_PROVIDER_SITE_OTHER): Payer: Self-pay | Admitting: Family Medicine

## 2019-10-08 DIAGNOSIS — N1831 Chronic kidney disease, stage 3a: Secondary | ICD-10-CM | POA: Diagnosis not present

## 2019-10-08 NOTE — Progress Notes (Signed)
Chief Complaint:   OBESITY Deanna Rowe is here to discuss her progress with her obesity treatment plan along with follow-up of her obesity related diagnoses. Deanna Rowe is on the Category 3 Plan and states she is following her eating plan approximately 50% of the time. Deanna Rowe states she is doing 0 minutes 0 times per week.  Today's visit was #: 12 Starting weight: 237 lbs Starting date: 04/16/2019 Today's weight: 240 lbs Today's date: 10/02/2019 Total lbs lost to date: 0 Total lbs lost since last in-office visit: 0  Interim History: Deanna Rowe is eating out 2 times per week. She is on the plan at breakfast and lunch. She is not getting up to eat at night as she had been. 24 hour food recall: Breakfast was Special K cereal with [employer] milk. Lunch was a frozen meal, and dinner she ate out (fried catfish (2 filets), french fries, and slaw). She is having a carbohydrate for dinner most meals. She is a bit down due to the death of her brother and she admits to stress eating.  Subjective:   1. Type 2 diabetes mellitus without complication, with long-term current use of insulin (HCC) Deanna Rowe is on Lantus and Humalog. She denies hypoglycemia. Her diabetes mellitus is managed by Dr. Buddy Duty, and her next appointment is next week. Her fasting CBGs range between 113 and 120, and 2 hour post prandial can be as high at 180.  Lab Results  Component Value Date   HGBA1C 7.9 (H) 04/16/2019   HGBA1C 6.9 11/25/2016   HGBA1C 6.0 06/10/2016   Lab Results  Component Value Date   MICROALBUR <0.7 02/12/2016   LDLCALC 57 04/16/2019   CREATININE 1.10 (H) 04/16/2019   Lab Results  Component Value Date   INSULIN 12.3 04/16/2019   Assessment/Plan:   1. Type 2 diabetes mellitus without complication, with long-term current use of insulin (HCC) Good blood sugar control is important to decrease the likelihood of diabetic complications such as nephropathy, neuropathy, limb loss, blindness, coronary artery disease,  and death. Intensive lifestyle modification including diet, exercise and weight loss are the first line of treatment for diabetes. Deanna Rowe will continue all her medications, and will follow up with Dr. Buddy Duty.  2. Class 2 severe obesity with serious comorbidity and body mass index (BMI) of 38.0 to 38.9 in adult, unspecified obesity type (HCC) Deanna Rowe is currently in the action stage of change. As such, her goal is to continue with weight loss efforts. She has agreed to the Category 3 Plan.   Exercise goals: No exercise has been prescribed at this time.  Behavioral modification strategies: increasing lean protein intake, decreasing simple carbohydrates and planning for success.  Deanna Rowe has agreed to follow-up with our clinic in 4 weeks. She was informed of the importance of frequent follow-up visits to maximize her success with intensive lifestyle modifications for her multiple health conditions.   Objective:   Blood pressure (!) 116/57, pulse 66, temperature 98.1 F (36.7 C), temperature source Oral, height 5\' 6"  (1.676 m), weight 240 lb (108.9 kg), SpO2 96 %. Body mass index is 38.74 kg/m.  General: Cooperative, alert, well developed, in no acute distress. HEENT: Conjunctivae and lids unremarkable. Cardiovascular: Regular rhythm.  Lungs: Normal work of breathing. Neurologic: No focal deficits.   Lab Results  Component Value Date   CREATININE 1.10 (H) 04/16/2019   BUN 11 04/16/2019   NA 142 04/16/2019   K 4.5 04/16/2019   CL 103 04/16/2019   CO2 27 04/16/2019  Lab Results  Component Value Date   ALT 21 04/16/2019   AST 23 04/16/2019   ALKPHOS 58 04/16/2019   BILITOT 0.3 04/16/2019   Lab Results  Component Value Date   HGBA1C 7.9 (H) 04/16/2019   HGBA1C 6.9 11/25/2016   HGBA1C 6.0 06/10/2016   HGBA1C 5.9 02/12/2016   HGBA1C 6.1 12/01/2015   Lab Results  Component Value Date   INSULIN 12.3 04/16/2019   Lab Results  Component Value Date   TSH 1.780 04/16/2019    Lab Results  Component Value Date   CHOL 155 04/16/2019   HDL 84 04/16/2019   LDLCALC 57 04/16/2019   TRIG 71 04/16/2019   Lab Results  Component Value Date   WBC 6.8 04/16/2019   HGB 12.3 04/16/2019   HCT 36.1 04/16/2019   MCV 96 04/16/2019   PLT 274 04/16/2019   No results found for: IRON, TIBC, FERRITIN  Attestation Statements:   Reviewed by clinician on day of visit: allergies, medications, problem list, medical history, surgical history, family history, social history, and previous encounter notes.   Wilhemena Durie, am acting as Location manager for [employer], FNP-C.  I have reviewed the above documentation for accuracy and completeness, and I agree with the above. -  Georgianne Fick, FNP

## 2019-10-09 MED FILL — TRINTELLIX 20 MG TABLET: 20 | 90 days supply | Qty: 90 | Fill #0

## 2019-10-09 MED FILL — FAMOTIDINE 20 MG TABS: 20 | 30 days supply | Qty: 60 | Fill #2

## 2019-10-09 MED FILL — AMLODIPINE BESYLATE 5 MG TA: 5 | 90 days supply | Qty: 90 | Fill #1

## 2019-10-09 MED FILL — MYRBETRIQ ER 50 MG TABLET: 50 | 30 days supply | Qty: 30 | Fill #2

## 2019-10-09 MED FILL — VALACYCLOVIR HCL 500 MG TAB: 500 | 90 days supply | Qty: 90 | Fill #3

## 2019-10-09 MED FILL — LEVOCETIRIZINE 5 MG TABLET: 5 | 30 days supply | Qty: 30 | Fill #2

## 2019-10-15 ENCOUNTER — Other Ambulatory Visit (HOSPITAL_BASED_OUTPATIENT_CLINIC_OR_DEPARTMENT_OTHER): Payer: Self-pay | Admitting: Internal Medicine

## 2019-10-15 DIAGNOSIS — R399 Unspecified symptoms and signs involving the genitourinary system: Secondary | ICD-10-CM | POA: Diagnosis not present

## 2019-10-15 DIAGNOSIS — D631 Anemia in chronic kidney disease: Secondary | ICD-10-CM | POA: Diagnosis not present

## 2019-10-15 DIAGNOSIS — N1832 Chronic kidney disease, stage 3b: Secondary | ICD-10-CM | POA: Diagnosis not present

## 2019-10-15 DIAGNOSIS — E559 Vitamin D deficiency, unspecified: Secondary | ICD-10-CM | POA: Diagnosis not present

## 2019-10-15 DIAGNOSIS — I129 Hypertensive chronic kidney disease with stage 1 through stage 4 chronic kidney disease, or unspecified chronic kidney disease: Secondary | ICD-10-CM | POA: Diagnosis not present

## 2019-10-15 MED FILL — BASAGLAR 100 UNIT/ML KWIKPE: 100 | 90 days supply | Qty: 45 | Fill #0

## 2019-10-16 ENCOUNTER — Encounter (INDEPENDENT_AMBULATORY_CARE_PROVIDER_SITE_OTHER): Payer: Self-pay | Admitting: Family Medicine

## 2019-10-16 ENCOUNTER — Other Ambulatory Visit: Payer: Self-pay

## 2019-10-16 ENCOUNTER — Ambulatory Visit (INDEPENDENT_AMBULATORY_CARE_PROVIDER_SITE_OTHER): Payer: 59 | Admitting: Family Medicine

## 2019-10-16 ENCOUNTER — Other Ambulatory Visit (HOSPITAL_BASED_OUTPATIENT_CLINIC_OR_DEPARTMENT_OTHER): Payer: Self-pay | Admitting: Psychiatry

## 2019-10-16 VITALS — BP 120/71 | HR 69 | Temp 97.8°F | Ht 66.0 in | Wt 237.0 lb

## 2019-10-16 DIAGNOSIS — F3181 Bipolar II disorder: Secondary | ICD-10-CM | POA: Diagnosis not present

## 2019-10-16 DIAGNOSIS — E1142 Type 2 diabetes mellitus with diabetic polyneuropathy: Secondary | ICD-10-CM

## 2019-10-16 DIAGNOSIS — Z794 Long term (current) use of insulin: Secondary | ICD-10-CM

## 2019-10-16 DIAGNOSIS — Z6838 Body mass index (BMI) 38.0-38.9, adult: Secondary | ICD-10-CM

## 2019-10-17 NOTE — Progress Notes (Signed)
Chief Complaint:   OBESITY Deanna Rowe is here to discuss her progress with her obesity treatment plan along with follow-up of her obesity related diagnoses. Deanna Rowe is on the Category 3 Plan and states she is following her eating plan approximately 50% of the time. Deanna Rowe states she is doing 0 minutes 0 times per week.  Today's visit was #: 13 Starting weight: 237 lbs Starting date: 04/16/2019 Today's weight: 237 lbs Today's date: 10/16/2019 Total lbs lost to date: 0 Total lbs lost since last in-office visit: 3  Interim History: Deanna Rowe is making better choices when eating out and has reduced eating out to 1 time per week. She is eating breakfast and lunch, and she is weighing meat at lunch. She does not always eat 3 eggs as it says on her plan. She does not like egg yolk. She notes her hunger is satisfied.  Subjective:   1. Type 2 diabetes mellitus with diabetic polyneuropathy, with long-term current use of insulin (HCC) Deanna Rowe's diabetes mellitus is not well controlled. Last A1c was 7.9 in April 2021 at Dr. Cindra Eves office. Her fasting BGs range between 100 and 120, at lunch in 120's, and supper >200 and high of 232. She is on Lantus 44 units and Humalog TID. She could not tolerate Trulicity.  Lab Results  Component Value Date   HGBA1C 7.9 (H) 04/16/2019   HGBA1C 6.9 11/25/2016   HGBA1C 6.0 06/10/2016   Lab Results  Component Value Date   MICROALBUR <0.7 02/12/2016   LDLCALC 57 04/16/2019   CREATININE 1.10 (H) 04/16/2019   Lab Results  Component Value Date   INSULIN 12.3 04/16/2019   Assessment/Plan:   1. Type 2 diabetes mellitus with diabetic polyneuropathy, with long-term current use of insulin (HCC) Good blood sugar control is important to decrease the likelihood of diabetic complications such as nephropathy, neuropathy, limb loss, blindness, coronary artery disease, and death. Intensive lifestyle modification including diet, exercise and weight loss are the first line  of treatment for diabetes. Deanna Rowe is to follow up with Dr. Buddy Duty in 2 weeks. He will recheck A1c.  2. Class 2 severe obesity with serious comorbidity and body mass index (BMI) of 38.0 to 38.9 in adult, unspecified obesity type (HCC) Deanna Rowe is currently in the action stage of change. As such, her goal is to continue with weight loss efforts. She has agreed to the Category 3 Plan.   Exercise goals: All adults should avoid inactivity. Some physical activity is better than none, and adults who participate in any amount of physical activity gain some health benefits.  Behavioral modification strategies: increasing lean protein intake, decreasing simple carbohydrates, meal planning and cooking strategies and planning for success.  Deanna Rowe has agreed to follow-up with our clinic in 3 weeks. She was informed of the importance of frequent follow-up visits to maximize her success with intensive lifestyle modifications for her multiple health conditions.   Objective:   Blood pressure 120/71, pulse 69, temperature 97.8 F (36.6 C), height 5\' 6"  (1.676 m), weight 237 lb (107.5 kg), SpO2 96 %. Body mass index is 38.25 kg/m.  General: Cooperative, alert, well developed, in no acute distress. HEENT: Conjunctivae and lids unremarkable. Cardiovascular: Regular rhythm.  Lungs: Normal work of breathing. Neurologic: No focal deficits.   Lab Results  Component Value Date   CREATININE 1.10 (H) 04/16/2019   BUN 11 04/16/2019   NA 142 04/16/2019   K 4.5 04/16/2019   CL 103 04/16/2019   CO2 27 04/16/2019  Lab Results  Component Value Date   ALT 21 04/16/2019   AST 23 04/16/2019   ALKPHOS 58 04/16/2019   BILITOT 0.3 04/16/2019   Lab Results  Component Value Date   HGBA1C 7.9 (H) 04/16/2019   HGBA1C 6.9 11/25/2016   HGBA1C 6.0 06/10/2016   HGBA1C 5.9 02/12/2016   HGBA1C 6.1 12/01/2015   Lab Results  Component Value Date   INSULIN 12.3 04/16/2019   Lab Results  Component Value Date   TSH  1.780 04/16/2019   Lab Results  Component Value Date   CHOL 155 04/16/2019   HDL 84 04/16/2019   LDLCALC 57 04/16/2019   TRIG 71 04/16/2019   Lab Results  Component Value Date   WBC 6.8 04/16/2019   HGB 12.3 04/16/2019   HCT 36.1 04/16/2019   MCV 96 04/16/2019   PLT 274 04/16/2019   No results found for: IRON, TIBC, FERRITIN  Attestation Statements:   Reviewed by clinician on day of visit: allergies, medications, problem list, medical history, surgical history, family history, social history, and previous encounter notes.   Wilhemena Durie, am acting as Location manager for [employer], FNP-C.  I have reviewed the above documentation for accuracy and completeness, and I agree with the above. -  Georgianne Fick, FNP

## 2019-10-18 ENCOUNTER — Encounter (INDEPENDENT_AMBULATORY_CARE_PROVIDER_SITE_OTHER): Payer: Self-pay | Admitting: Family Medicine

## 2019-10-22 MED FILL — MONTELUKAST SOD 10 MG TAB: 10 | 30 days supply | Qty: 30 | Fill #2

## 2019-10-22 MED FILL — LOSARTAN-HCTZ 100-12.5 MG T: 100-12.5 | 90 days supply | Qty: 90 | Fill #0

## 2019-10-28 DIAGNOSIS — I700 Atherosclerosis of aorta: Secondary | ICD-10-CM | POA: Diagnosis not present

## 2019-10-28 DIAGNOSIS — E119 Type 2 diabetes mellitus without complications: Secondary | ICD-10-CM | POA: Diagnosis not present

## 2019-10-28 DIAGNOSIS — E042 Nontoxic multinodular goiter: Secondary | ICD-10-CM | POA: Diagnosis not present

## 2019-10-28 DIAGNOSIS — N1831 Chronic kidney disease, stage 3a: Secondary | ICD-10-CM | POA: Diagnosis not present

## 2019-10-31 MED FILL — SOLIFENACIN SUCCINATE 10 MG: 10 | 30 days supply | Qty: 30 | Fill #5

## 2019-11-05 ENCOUNTER — Encounter (INDEPENDENT_AMBULATORY_CARE_PROVIDER_SITE_OTHER): Payer: Self-pay | Admitting: Family Medicine

## 2019-11-05 ENCOUNTER — Other Ambulatory Visit: Payer: Self-pay

## 2019-11-05 ENCOUNTER — Ambulatory Visit (INDEPENDENT_AMBULATORY_CARE_PROVIDER_SITE_OTHER): Payer: 59 | Admitting: Family Medicine

## 2019-11-05 VITALS — BP 125/67 | HR 66 | Temp 97.5°F | Ht 66.0 in | Wt 238.0 lb

## 2019-11-05 DIAGNOSIS — E1142 Type 2 diabetes mellitus with diabetic polyneuropathy: Secondary | ICD-10-CM | POA: Diagnosis not present

## 2019-11-05 DIAGNOSIS — Z6838 Body mass index (BMI) 38.0-38.9, adult: Secondary | ICD-10-CM | POA: Diagnosis not present

## 2019-11-05 DIAGNOSIS — Z794 Long term (current) use of insulin: Secondary | ICD-10-CM | POA: Diagnosis not present

## 2019-11-05 MED ORDER — BASAGLAR KWIKPEN 100 UNIT/ML ~~LOC~~ SOPN: 40.0000 [IU] | PEN_INJECTOR | Freq: Every day | SUBCUTANEOUS | 0 refills | Status: DC

## 2019-11-05 MED ORDER — INSULIN LISPRO 100 UNIT/ML ~~LOC~~ SOLN: SUBCUTANEOUS | 0 refills | Status: DC

## 2019-11-06 ENCOUNTER — Ambulatory Visit: Payer: 59 | Admitting: Allergy and Immunology

## 2019-11-06 MED FILL — MYRBETRIQ ER 50 MG TABLET: 50 | 30 days supply | Qty: 30 | Fill #3

## 2019-11-06 MED FILL — LEVOCETIRIZINE 5 MG TABLET: 5 | 30 days supply | Qty: 30 | Fill #3

## 2019-11-06 NOTE — Progress Notes (Signed)
Chief Complaint:   OBESITY Deanna Rowe is here to discuss her progress with her obesity treatment plan along with follow-up of her obesity related diagnoses. Deanna Rowe is on the Category 3 Plan and states she is following her eating plan approximately 50% of the time. Deanna Rowe states she is doing 0 minutes 0 times per week.  Today's visit was #: 14 Starting weight: 237 lbs Starting date: 04/16/2019 Today's weight: 238 lbs Today's date: 11/05/2019 Total lbs lost to date: 0 Total lbs lost since last in-office visit: 0  Interim History: Deanna Rowe is eating out 2 times per week but says she is making good choices. However upon recall she is having a carbohydrate (rice or potato) every evening at supper. She is also waking up and eating carbohydrates (cookies) during te nights because she has hypoglycemia.   Subjective:   1. Type 2 diabetes mellitus with diabetic polyneuropathy, with long-term current use of insulin (HCC) Deanna Rowe's last A1c was not at goal (A1c is 7.6 down from 7.9). She reports hypoglycemia between 55 and 65 around 2 AM. She saw Dr. Buddy Duty last week but she did not tell him about hypoglycemia.  Lab Results  Component Value Date   HGBA1C 7.9 (H) 04/16/2019   HGBA1C 6.9 11/25/2016   HGBA1C 6.0 06/10/2016   Lab Results  Component Value Date   MICROALBUR <0.7 02/12/2016   LDLCALC 57 04/16/2019   CREATININE 1.10 (H) 04/16/2019   Lab Results  Component Value Date   INSULIN 12.3 04/16/2019   Assessment/Plan:   1. Type 2 diabetes mellitus with diabetic polyneuropathy, with long-term current use of insulin (HCC)  Deanna Rowe will call Dr. Buddy Duty for medication adjustment. Advised her to have snack at bedtime (mix of protein and carbs) until adjustment is made. We discussed using her glucose pills for hypoglycemic episodes. She has our handout with directions.   2. Class 2 severe obesity with serious comorbidity and body mass index (BMI) of 38.0 to 38.9 in adult, unspecified obesity  type (HCC) Deanna Rowe is currently in the action stage of change. As such, her goal is to continue with weight loss efforts. She has agreed to the Category 3 Plan.   Deanna Rowe will add a bedtime snack with carbohydrates and protein.  Exercise goals: All adults should avoid inactivity. Some physical activity is better than none, and adults who participate in any amount of physical activity gain some health benefits.  Behavioral modification strategies: increasing lean protein intake, decreasing simple carbohydrates and better snacking choices.  Deanna Rowe has agreed to follow-up with our clinic in 3 weeks. She was informed of the importance of frequent follow-up visits to maximize her success with intensive lifestyle modifications for her multiple health conditions.   Objective:   Blood pressure 125/67, pulse 66, temperature (!) 97.5 F (36.4 C), temperature source Oral, height 5\' 6"  (1.676 m), weight 238 lb (108 kg), SpO2 97 %. Body mass index is 38.41 kg/m.  General: Cooperative, alert, well developed, in no acute distress. HEENT: Conjunctivae and lids unremarkable. Cardiovascular: Regular rhythm.  Lungs: Normal work of breathing. Neurologic: No focal deficits.   Lab Results  Component Value Date   CREATININE 1.10 (H) 04/16/2019   BUN 11 04/16/2019   NA 142 04/16/2019   K 4.5 04/16/2019   CL 103 04/16/2019   CO2 27 04/16/2019   Lab Results  Component Value Date   ALT 21 04/16/2019   AST 23 04/16/2019   ALKPHOS 58 04/16/2019   BILITOT 0.3 04/16/2019   Lab  Results  Component Value Date   HGBA1C 7.9 (H) 04/16/2019   HGBA1C 6.9 11/25/2016   HGBA1C 6.0 06/10/2016   HGBA1C 5.9 02/12/2016   HGBA1C 6.1 12/01/2015   Lab Results  Component Value Date   INSULIN 12.3 04/16/2019   Lab Results  Component Value Date   TSH 1.780 04/16/2019   Lab Results  Component Value Date   CHOL 155 04/16/2019   HDL 84 04/16/2019   LDLCALC 57 04/16/2019   TRIG 71 04/16/2019   Lab Results    Component Value Date   WBC 6.8 04/16/2019   HGB 12.3 04/16/2019   HCT 36.1 04/16/2019   MCV 96 04/16/2019   PLT 274 04/16/2019   No results found for: IRON, TIBC, FERRITIN  Attestation Statements:   Reviewed by clinician on day of visit: allergies, medications, problem list, medical history, surgical history, family history, social history, and previous encounter notes.   Wilhemena Durie, am acting as Location manager for [employer], FNP-C.  I have reviewed the above documentation for accuracy and completeness, and I agree with the above. -  Georgianne Fick, FNP

## 2019-11-12 DIAGNOSIS — D472 Monoclonal gammopathy: Secondary | ICD-10-CM | POA: Diagnosis not present

## 2019-11-12 DIAGNOSIS — E7800 Pure hypercholesterolemia, unspecified: Secondary | ICD-10-CM | POA: Diagnosis not present

## 2019-11-12 DIAGNOSIS — D6861 Antiphospholipid syndrome: Secondary | ICD-10-CM | POA: Diagnosis not present

## 2019-11-12 DIAGNOSIS — E559 Vitamin D deficiency, unspecified: Secondary | ICD-10-CM | POA: Diagnosis not present

## 2019-11-12 DIAGNOSIS — E1122 Type 2 diabetes mellitus with diabetic chronic kidney disease: Secondary | ICD-10-CM | POA: Diagnosis not present

## 2019-11-12 DIAGNOSIS — N183 Chronic kidney disease, stage 3 unspecified: Secondary | ICD-10-CM | POA: Diagnosis not present

## 2019-11-12 DIAGNOSIS — Z0000 Encounter for general adult medical examination without abnormal findings: Secondary | ICD-10-CM | POA: Diagnosis not present

## 2019-11-12 DIAGNOSIS — I10 Essential (primary) hypertension: Secondary | ICD-10-CM | POA: Diagnosis not present

## 2019-11-12 DIAGNOSIS — F339 Major depressive disorder, recurrent, unspecified: Secondary | ICD-10-CM | POA: Diagnosis not present

## 2019-11-12 DIAGNOSIS — G2119 Other drug induced secondary parkinsonism: Secondary | ICD-10-CM | POA: Diagnosis not present

## 2019-11-12 DIAGNOSIS — E042 Nontoxic multinodular goiter: Secondary | ICD-10-CM | POA: Diagnosis not present

## 2019-11-12 DIAGNOSIS — E2839 Other primary ovarian failure: Secondary | ICD-10-CM | POA: Diagnosis not present

## 2019-11-12 MED FILL — CARBIDOPA-LEVODOPA 25-250 T: 25-250 | 90 days supply | Qty: 270 | Fill #2

## 2019-11-12 MED FILL — HUMALOG 100 UNITS/ML KWIKPE: 100 | 89 days supply | Qty: 45 | Fill #1

## 2019-11-13 ENCOUNTER — Other Ambulatory Visit: Payer: Self-pay | Admitting: Family Medicine

## 2019-11-13 DIAGNOSIS — E042 Nontoxic multinodular goiter: Secondary | ICD-10-CM

## 2019-11-15 ENCOUNTER — Other Ambulatory Visit: Payer: Self-pay | Admitting: Family Medicine

## 2019-11-15 DIAGNOSIS — E2839 Other primary ovarian failure: Secondary | ICD-10-CM

## 2019-11-18 ENCOUNTER — Other Ambulatory Visit: Payer: Self-pay | Admitting: Neurology

## 2019-11-18 ENCOUNTER — Ambulatory Visit (INDEPENDENT_AMBULATORY_CARE_PROVIDER_SITE_OTHER): Payer: 59 | Admitting: Neurology

## 2019-11-18 ENCOUNTER — Encounter: Payer: Self-pay | Admitting: Neurology

## 2019-11-18 VITALS — BP 132/84 | HR 73 | Ht 66.0 in | Wt 241.0 lb

## 2019-11-18 DIAGNOSIS — R269 Unspecified abnormalities of gait and mobility: Secondary | ICD-10-CM

## 2019-11-18 DIAGNOSIS — G20 Parkinson's disease: Secondary | ICD-10-CM | POA: Diagnosis not present

## 2019-11-18 HISTORY — DX: Unspecified abnormalities of gait and mobility: R26.9

## 2019-11-18 MED ORDER — CARBIDOPA-LEVODOPA ER 50-200 MG PO TBCR: 1.0000 | EXTENDED_RELEASE_TABLET | Freq: Four times a day (QID) | ORAL | 1 refills | Status: DC

## 2019-11-18 MED FILL — CARBIDOPA-LEVO ER 50-200 TA: 50-200 | 90 days supply | Qty: 360 | Fill #0

## 2019-11-18 NOTE — Patient Instructions (Signed)
Stop the Sinemet 25/250 mg tablet and start Sinemet 50/200 mg tablet 4 times a day.  Sinemet (carbidopa) may result in confusion or hallucinations, drowsiness, nausea, or dizziness. If any significant side effects are noted, please contact our office. Sinemet may not be well absorbed when taken with high protein meals, if tolerated it is best to take 30-45 minutes before you eat.

## 2019-11-18 NOTE — Progress Notes (Signed)
Reason for visit: Parkinson's disease  Deanna Rowe is an 64 y.o. female  History of present illness:  Deanna Rowe is a 64 year old right-handed black female with a history of Parkinson's disease.  She has a history of depression as well as had ECT treatment that has resulted in some chronic memory issues.  She has obesity and has sleep apnea on CPAP.  She has had some changes in her ability to walk since last seen, she has fallen on 3 occasions, she feels at times that her legs may collapse on her.  She does note some end dose effect with increased problems with walking just before her next dose of the 25/250 mg Sinemet tablet.  She is on Requip taking 2 mg twice daily.  She will have occasional freezing episodes which may result in falls.  She is not operating a motor vehicle.  She reports some difficulty with swallowing at times but mainly has problems with solids rather than liquids.  She returns to the office today for further evaluation.  Past Medical History:  Diagnosis Date  . Anemia   . Anxiety   . Arthritis    L HIP  . Asthma   . Avascular necrosis of hip (HCC)    LEFT  . Back pain   . Chest pain   . Constipation   . Depression   . Diabetes mellitus   . Diabetes mellitus, type II ([place])   . Dry cough   . Edema, lower extremity   . GERD (gastroesophageal reflux disease)   . High cholesterol   . Hypertension   . Insomnia    takes Ambien nightly  . Joint pain   . Kidney disease   . Kidney disease   . Obesity   . Parkinsonism ([place]) 03/02/2017  . PONV (postoperative nausea and vomiting)   . RLS (restless legs syndrome) 01/15/2018  . Sleep apnea   . Swallowing difficulty   . Thyroid disease   . TIA (transient ischemic attack) 2012   no residual problems  . Urgency incontinence   . Vitamin D deficiency     Past Surgical History:  Procedure Laterality Date  . DILATION AND CURETTAGE OF UTERUS    . ESOPHAGEAL MANOMETRY N/A 09/03/2012   Procedure:  ESOPHAGEAL MANOMETRY (EM);  Surgeon: Garlan Fair, MD;  Location: [facility];  Service: Endoscopy;  Laterality: N/A;  . ESOPHAGEAL MANOMETRY N/A 06/19/2017   Procedure: ESOPHAGEAL MANOMETRY (EM);  Surgeon: Clarene Essex, MD;  Location: [facility];  Service: Endoscopy;  Laterality: N/A;  . EXPLORATORY LAPAROTOMY    . GANGLION CYST EXCISION    . JOINT REPLACEMENT  2011   rt total hip  . KNEE ARTHROSCOPY    . PILONIDAL CYST / SINUS EXCISION    . TOTAL HIP ARTHROPLASTY Left 10/09/2013   Procedure: LEFT TOTAL HIP ARTHROPLASTY ANTERIOR APPROACH;  Surgeon: [name], MD;  Location: [facility];  Service: Orthopedics;  Laterality: Left;    Family History  Problem Relation Age of Onset  . Alcohol abuse Brother   . Alcohol abuse Brother   . Alcohol abuse Brother   . Alcohol abuse Brother   . High blood pressure Mother   . Hyperlipidemia Mother   . Heart disease Mother   . Heart disease Father   . Alcoholism Father   . Allergic rhinitis Sister   . Diabetes Neg Hx   . Angioedema Neg Hx   . Asthma Neg Hx   . Eczema Neg  Hx   . Immunodeficiency Neg Hx   . Urticaria Neg Hx   . Breast cancer Neg Hx     Social history:  reports that she has never smoked. She has never used smokeless tobacco. She reports that she does not drink alcohol and does not use drugs.    Allergies  Allergen Reactions  . Fluticasone-Salmeterol Anaphylaxis  . Codeine Nausea And Vomiting  . Fetzima [Levomilnacipran] Nausea And Vomiting  . Nsaids Other (See Comments)    Upset stomach   . Trulicity [Dulaglutide] Nausea And Vomiting    Significant and undesirably rapid (40 lbs) weight loss   . Xanax [Alprazolam] Other (See Comments)    disoriented  . Amoxicillin Rash    Has patient had a PCN reaction causing immediate rash, facial/tongue/throat swelling, SOB or lightheadedness with hypotension: Yes Has patient had a PCN reaction causing severe rash involving mucus membranes or skin necrosis: No Has patient had a  PCN reaction that required hospitalization: No Has patient had a PCN reaction occurring within the last 10 years: No If all of the above answers are "NO", then may proceed with Cephalosporin use.   . Pseudoephedrine Hcl Er Palpitations    Medications:  Prior to Admission medications   Medication Sig Start Date End Date Taking? Authorizing Provider  ACCU-CHEK FASTCLIX LANCETS MISC USE TO CHECK BLOOD SUGAR 3 TIMES A DAY 02/27/18  Yes Renato Shin, MD  ACCU-CHEK GUIDE test strip USE TO CHECK BLOOD SUGAR 3 TIMES PER DAY. Patient taking differently: Use to check blood sugar 4 times per day. 02/02/17  Yes Renato Shin, MD  albuterol (PROVENTIL HFA;VENTOLIN HFA) 108 (90 Base) MCG/ACT inhaler Inhale 2 puffs into the lungs as needed for wheezing or shortness of breath. 02/08/18  Yes Bobbitt, Sedalia Muta, MD  amLODipine (NORVASC) 5 MG tablet Take 5 mg by mouth every morning.    Yes [provider]  aspirin EC 81 MG tablet Take 81 mg by mouth every evening.   Yes [provider]  atorvastatin (LIPITOR) 10 MG tablet Take 10 mg by mouth at bedtime. 12/05/17  Yes [provider]  azelastine (ASTELIN) 0.1 % nasal spray Place 2 sprays into both nostrils 2 (two) times daily. Use in each nostril as directed 02/08/18  Yes Bobbitt, Sedalia Muta, MD  Brexpiprazole (REXULTI) 2 MG TABS Take 2 mg by mouth daily. Patient taking differently: Take 1 mg by mouth daily.  07/10/18  Yes Kathrynn Ducking, MD  budesonide-formoterol [facility]) 160-4.5 MCG/ACT inhaler 2 puffs twice a day with spacer 07/25/19  Yes Bobbitt, Sedalia Muta, MD  buPROPion (WELLBUTRIN XL) 150 MG 24 hr tablet Take 450 mg by mouth every morning.    Yes [provider]  Calcium Carbonate-Vitamin D (CALCIUM 600+D) 600-400 MG-UNIT tablet Take 1 tablet by mouth daily.   Yes [provider]  carbidopa-levodopa (SINEMET) 25-250 MG tablet Take 1 tablet by mouth 3 (three) times daily. 05/07/19  Yes Kathrynn Ducking, MD    famotidine (PEPCID) 20 MG tablet TAKE 1 TABLET (20 MG TOTAL) BY MOUTH 2 (TWO) TIMES DAILY. 08/12/19  Yes Bobbitt, Sedalia Muta, MD  fluticasone [facility]) 50 MCG/ACT nasal spray Place 2 sprays into both nostrils daily. 09/24/19  Yes Ambs, Kathrine Cords, FNP  furosemide (LASIX) 20 MG tablet Take 20 mg by mouth every other day.   Yes [provider]  gabapentin (NEURONTIN) 300 MG capsule Take 300 mg by mouth 2 (two) times daily.    Yes [provider]  hydrochlorothiazide (HYDRODIURIL) 12.5 MG tablet Take 12.5 mg by mouth daily.   Yes [provider]  Insulin Glargine (BASAGLAR KWIKPEN) 100 UNIT/ML Inject 0.4 mLs (40 Units total) into the skin daily. 11/05/19  Yes Whitmire, Dawn W, FNP  insulin lispro (HUMALOG) 100 UNIT/ML injection SQ - 14 units at breakfast, 12 units at lunch, 16 units at supper 11/05/19  Yes Whitmire, Dawn W, FNP  Lancets (FREESTYLE) lancets  06/12/19  Yes [provider]  levocetirizine (XYZAL) 5 MG tablet TAKE 1 TABLET BY MOUTH EVERY EVENING 08/08/19  Yes Bobbitt, Sedalia Muta, MD  LORazepam (ATIVAN) 2 MG tablet Take 2 mg by mouth 3 (three) times daily as needed. 06/03/19  Yes [provider]  losartan (COZAAR) 100 MG tablet Take 100 mg by mouth daily.   Yes [provider]  mirabegron ER (MYRBETRIQ) 50 MG TB24 tablet Take 50 mg by mouth daily.   Yes [provider]  montelukast (SINGULAIR) 10 MG tablet TAKE 1 TABLET (10 MG TOTAL) BY MOUTH AT BEDTIME. 08/08/19  Yes Bobbitt, Sedalia Muta, MD  Olopatadine HCl (PATADAY) 0.2 % SOLN Place 1 drop into both eyes daily as needed. 06/13/19  Yes Bobbitt, Sedalia Muta, MD  propranolol (INDERAL) 40 MG tablet TAKE 1 TABLET (40 MG TOTAL) BY MOUTH 2 (TWO) TIMES DAILY. 09/05/19  Yes Jettie Booze, MD  rOPINIRole (REQUIP) 2 MG tablet Take 2 mg by mouth 2 (two) times daily. 03/16/15  Yes [provider]  solifenacin (VESICARE) 10 MG tablet Take 10 mg by mouth daily. 06/05/19  Yes [provider]  valACYclovir (VALTREX) 1000 MG tablet Take 500 mg by mouth daily.    Yes [provider]  vitamin C (ASCORBIC ACID) 500 MG tablet Take 500 mg by mouth daily.   Yes [provider]  Vitamin D, Ergocalciferol, 2000 units CAPS Take 1 capsule by mouth daily. 2000 units daily.   Yes [provider]  vortioxetine HBr (TRINTELLIX) 20 MG TABS Take 20 mg by mouth daily.    Yes [provider]  zolpidem (AMBIEN) 5 MG tablet Take 5 mg by mouth at bedtime.    Yes [provider]    ROS:  Out of a complete 14 system review of symptoms, the patient complains only of the following symptoms, and all other reviewed systems are negative.  Walking difficulty Memory problems Depression  Blood pressure 132/84, pulse 73, height 5\' 6"  (1.676 m), weight 241 lb (109.3 kg), SpO2 93 %.  Physical Exam  General: The patient is alert and cooperative at the time of the examination.  The patient is markedly obese.  Skin: No significant peripheral edema is noted.   Neurologic Exam  Mental status: The patient is alert and oriented x 3 at the time of the examination. The patient has apparent normal recent and remote memory, with an apparently normal attention span and concentration ability.   Cranial nerves: Facial symmetry is present. Speech is normal, no aphasia or dysarthria is noted. Extraocular movements are full. Visual fields are full.  Mild masking the face is seen.  Motor: The patient has good strength in all 4 extremities.  Sensory examination: Soft touch sensation is symmetric on the face, arms, and legs.  Coordination: The patient has good finger-nose-finger and heel-to-shin bilaterally.  Gait and station: The patient is unable to arise from a seated position with arms crossed.  Once up, she can walk independently, she has fairly good stride and turn.  Tandem gait is slightly  unsteady.  Romberg is negative.  Reflexes: Deep tendon reflexes  are symmetric.   Assessment/Plan:  1.  Parkinson's disease  2.  Gait disorder  3.  Memory problems  4.  History of depression  5.  Sleep apnea on CPAP  The patient will be increased on the frequency of Sinemet dosing taking the Sinemet CR 50/200 mg tablets, 1 tablet 4 times daily.  The patient will continue the Requip.  This can be increased in the future.  The patient claims that she will be getting into a regular exercise regimen in the near future.  She does not operate a motor vehicle currently.  She will follow-up here in 5 months.  Jill Alexanders MD 11/18/2019 12:11 PM  [facility] [address] [place],  [address]  Phone [phone] Fax [phone]

## 2019-11-22 ENCOUNTER — Ambulatory Visit
Admission: RE | Admit: 2019-11-22 | Discharge: 2019-11-22 | Disposition: A | Discharge status: Home / Self Care | Payer: 59 | Source: Ambulatory Visit | Attending: Family Medicine | Admitting: Family Medicine

## 2019-11-22 DIAGNOSIS — E042 Nontoxic multinodular goiter: Secondary | ICD-10-CM

## 2019-11-22 DIAGNOSIS — E041 Nontoxic single thyroid nodule: Secondary | ICD-10-CM | POA: Diagnosis not present

## 2019-11-26 ENCOUNTER — Other Ambulatory Visit: Payer: Self-pay

## 2019-11-26 ENCOUNTER — Encounter (INDEPENDENT_AMBULATORY_CARE_PROVIDER_SITE_OTHER): Payer: Self-pay | Admitting: Family Medicine

## 2019-11-26 ENCOUNTER — Ambulatory Visit (INDEPENDENT_AMBULATORY_CARE_PROVIDER_SITE_OTHER): Payer: 59 | Admitting: Family Medicine

## 2019-11-26 ENCOUNTER — Other Ambulatory Visit: Payer: Self-pay | Admitting: Allergy and Immunology

## 2019-11-26 VITALS — BP 117/65 | HR 65 | Temp 97.4°F | Ht 66.0 in | Wt 239.0 lb

## 2019-11-26 DIAGNOSIS — N183 Chronic kidney disease, stage 3 unspecified: Secondary | ICD-10-CM | POA: Diagnosis not present

## 2019-11-26 DIAGNOSIS — E162 Hypoglycemia, unspecified: Secondary | ICD-10-CM | POA: Diagnosis not present

## 2019-11-26 DIAGNOSIS — M169 Osteoarthritis of hip, unspecified: Secondary | ICD-10-CM | POA: Diagnosis not present

## 2019-11-26 DIAGNOSIS — Z6838 Body mass index (BMI) 38.0-38.9, adult: Secondary | ICD-10-CM

## 2019-11-26 DIAGNOSIS — E1142 Type 2 diabetes mellitus with diabetic polyneuropathy: Secondary | ICD-10-CM | POA: Diagnosis not present

## 2019-11-26 DIAGNOSIS — E7800 Pure hypercholesterolemia, unspecified: Secondary | ICD-10-CM | POA: Diagnosis not present

## 2019-11-26 DIAGNOSIS — Z794 Long term (current) use of insulin: Secondary | ICD-10-CM

## 2019-11-26 DIAGNOSIS — I10 Essential (primary) hypertension: Secondary | ICD-10-CM | POA: Diagnosis not present

## 2019-11-26 DIAGNOSIS — G2119 Other drug induced secondary parkinsonism: Secondary | ICD-10-CM | POA: Diagnosis not present

## 2019-11-26 DIAGNOSIS — G20 Parkinson's disease: Secondary | ICD-10-CM | POA: Diagnosis not present

## 2019-11-26 DIAGNOSIS — J4540 Moderate persistent asthma, uncomplicated: Secondary | ICD-10-CM | POA: Diagnosis not present

## 2019-11-26 DIAGNOSIS — M179 Osteoarthritis of knee, unspecified: Secondary | ICD-10-CM | POA: Diagnosis not present

## 2019-11-26 DIAGNOSIS — N2581 Secondary hyperparathyroidism of renal origin: Secondary | ICD-10-CM | POA: Diagnosis not present

## 2019-11-26 DIAGNOSIS — F339 Major depressive disorder, recurrent, unspecified: Secondary | ICD-10-CM | POA: Diagnosis not present

## 2019-11-26 DIAGNOSIS — E1122 Type 2 diabetes mellitus with diabetic chronic kidney disease: Secondary | ICD-10-CM | POA: Diagnosis not present

## 2019-11-26 MED FILL — rOPINIRole HCL 2 MG TABS: 2 | 90 days supply | Qty: 180 | Fill #1

## 2019-11-26 MED FILL — MONTELUKAST SOD 10 MG TAB: 10 | 30 days supply | Qty: 30 | Fill #3

## 2019-11-26 MED FILL — SOLIFENACIN SUCCINATE 10 MG: 10 | 30 days supply | Qty: 30 | Fill #6

## 2019-11-26 MED FILL — ATORVASTATIN CALCIUM 10 MG: 10 | 90 days supply | Qty: 90 | Fill #3

## 2019-11-26 MED FILL — FAMOTIDINE 20 MG TABS: 20 | 30 days supply | Qty: 60 | Fill #0

## 2019-11-26 MED FILL — ZOLPIDEM TARTRATE 5 MG TAB: 5 | 90 days supply | Qty: 90 | Fill #0

## 2019-11-26 MED FILL — PROPRANOLOL 40 MG TABLET: 40 | 90 days supply | Qty: 180 | Fill #1

## 2019-11-26 NOTE — Progress Notes (Signed)
Chief Complaint:   OBESITY Deanna Rowe is here to discuss her progress with her obesity treatment plan along with follow-up of her obesity related diagnoses. Deanna Rowe is on the Category 3 Plan and states she is following her eating plan approximately 50% of the time. Deanna Rowe states she is on the treadmill for 60 minutes 3 times per week.  Today's visit was #: 15 Starting weight: 237 lbs Starting date: 04/16/2019 Today's weight: 239 lbs Today's date: 11/26/2019 Total lbs lost to date: 0 Total lbs lost since last in-office visit: 0  Interim History: Deanna Rowe is working out more. She started going to the gym 3 times per week. She is skipping lunch. She has no appetite at lunch. She is only eating out 1 time per week vs 3 x per week previously..  Subjective:   1. Type 2 diabetes mellitus with diabetic polyneuropathy, with long-term current use of insulin (HCC) A1c is not at goal but has improved. Last A1c was 7.6 in July at Dr. Cindra Eves office. Deanna Rowe is having hypoglycemia in the morning (40-66). She has spoken to Dr. Buddy Duty and he has adjusted her Basal insulin. She has not started the reduced dose of Basal insulin yet.  Lab Results  Component Value Date   HGBA1C 7.9 (H) 04/16/2019   HGBA1C 6.9 11/25/2016   HGBA1C 6.0 06/10/2016   Lab Results  Component Value Date   MICROALBUR <0.7 02/12/2016   LDLCALC 57 04/16/2019   CREATININE 1.10 (H) 04/16/2019   Lab Results  Component Value Date   INSULIN 12.3 04/16/2019   2. Hypoglycemia Deanna Rowe has low CBGs in the morning (40-66). Her insulin was adjusted per Dr. Buddy Duty.  Assessment/Plan:   1. Type 2 diabetes mellitus with diabetic polyneuropathy, with long-term current use of insulin (HCC)  Deanna Rowe will continue to follow up with Dr. Buddy Duty.  2. Hypoglycemia Handout was provided today for hypoglycemia. We went over hypoglycemia protocol. She will stop skipping meals.  3. Class 2 severe obesity with serious comorbidity and body mass index  (BMI) of 38.0 to 38.9 in adult, unspecified obesity type (HCC) Deanna Rowe is currently in the action stage of change. As such, her goal is to continue with weight loss efforts. She has agreed to the Category 3 Plan.   Deanna Rowe will make a concerted effort to have lunch every day.  Exercise goals: As is.  Behavioral modification strategies: increasing lean protein intake, no skipping meals and planning for success.  Deanna Rowe has agreed to follow-up with our clinic in 3 weeks. She was informed of the importance of frequent follow-up visits to maximize her success with intensive lifestyle modifications for her multiple health conditions.   Objective:   Blood pressure 117/65, pulse 65, temperature (!) 97.4 F (36.3 C), temperature source Oral, height 5\' 6"  (1.676 m), weight 239 lb (108.4 kg), SpO2 96 %. Body mass index is 38.58 kg/m.  General: Cooperative, alert, well developed, in no acute distress. HEENT: Conjunctivae and lids unremarkable. Cardiovascular: Regular rhythm.  Lungs: Normal work of breathing. Neurologic: No focal deficits.   Lab Results  Component Value Date   CREATININE 1.10 (H) 04/16/2019   BUN 11 04/16/2019   NA 142 04/16/2019   K 4.5 04/16/2019   CL 103 04/16/2019   CO2 27 04/16/2019   Lab Results  Component Value Date   ALT 21 04/16/2019   AST 23 04/16/2019   ALKPHOS 58 04/16/2019   BILITOT 0.3 04/16/2019   Lab Results  Component Value Date   HGBA1C  7.9 (H) 04/16/2019   HGBA1C 6.9 11/25/2016   HGBA1C 6.0 06/10/2016   HGBA1C 5.9 02/12/2016   HGBA1C 6.1 12/01/2015   Lab Results  Component Value Date   INSULIN 12.3 04/16/2019   Lab Results  Component Value Date   TSH 1.780 04/16/2019   Lab Results  Component Value Date   CHOL 155 04/16/2019   HDL 84 04/16/2019   LDLCALC 57 04/16/2019   TRIG 71 04/16/2019   Lab Results  Component Value Date   WBC 6.8 04/16/2019   HGB 12.3 04/16/2019   HCT 36.1 04/16/2019   MCV 96 04/16/2019   PLT 274  04/16/2019   No results found for: IRON, TIBC, FERRITIN  Attestation Statements:   Reviewed by clinician on day of visit: allergies, medications, problem list, medical history, surgical history, family history, social history, and previous encounter notes.   Deanna Rowe, am acting as Location manager for [employer], FNP-C.  I have reviewed the above documentation for accuracy and completeness, and I agree with the above. -  Deanna Fick, FNP

## 2019-11-27 ENCOUNTER — Encounter (INDEPENDENT_AMBULATORY_CARE_PROVIDER_SITE_OTHER): Payer: Self-pay | Admitting: Family Medicine

## 2019-11-29 DIAGNOSIS — Z20828 Contact with and (suspected) exposure to other viral communicable diseases: Secondary | ICD-10-CM | POA: Diagnosis not present

## 2019-12-02 DIAGNOSIS — F251 Schizoaffective disorder, depressive type: Secondary | ICD-10-CM | POA: Diagnosis not present

## 2019-12-03 MED FILL — MYRBETRIQ ER 50 MG TABLET: 50 | 30 days supply | Qty: 30 | Fill #4

## 2019-12-03 MED FILL — buPROPion HCL ER (XL) 150 M: 150 | 90 days supply | Qty: 270 | Fill #1

## 2019-12-03 MED FILL — GABAPENTIN 300 MG CAPSULE: 300 | 90 days supply | Qty: 180 | Fill #0

## 2019-12-03 MED FILL — LEVOCETIRIZINE 5 MG TABLET: 5 | 30 days supply | Qty: 30 | Fill #4

## 2019-12-05 ENCOUNTER — Other Ambulatory Visit: Payer: Self-pay

## 2019-12-05 ENCOUNTER — Ambulatory Visit (INDEPENDENT_AMBULATORY_CARE_PROVIDER_SITE_OTHER): Payer: 59 | Admitting: Allergy and Immunology

## 2019-12-05 ENCOUNTER — Encounter: Payer: Self-pay | Admitting: Allergy and Immunology

## 2019-12-05 VITALS — BP 120/66 | HR 65 | Temp 98.0°F | Resp 16

## 2019-12-05 DIAGNOSIS — J4540 Moderate persistent asthma, uncomplicated: Secondary | ICD-10-CM

## 2019-12-05 DIAGNOSIS — R05 Cough: Secondary | ICD-10-CM | POA: Diagnosis not present

## 2019-12-05 DIAGNOSIS — J3089 Other allergic rhinitis: Secondary | ICD-10-CM

## 2019-12-05 DIAGNOSIS — K219 Gastro-esophageal reflux disease without esophagitis: Secondary | ICD-10-CM

## 2019-12-05 DIAGNOSIS — R053 Chronic cough: Secondary | ICD-10-CM

## 2019-12-05 MED ORDER — DEXLANSOPRAZOLE 30 MG PO CPDR: 30.0000 mg | DELAYED_RELEASE_CAPSULE | Freq: Every day | ORAL | 2 refills | Status: DC

## 2019-12-05 NOTE — Assessment & Plan Note (Signed)
   Treatment plan as outlined above.

## 2019-12-05 NOTE — Assessment & Plan Note (Addendum)
Currently with suboptimal control.  We are unable to start LAMA because the patient had a reaction to Trelegy but cannot tolerate ICS and LABA.  We will plan to start Xolair injections every 4 weeks, unless the patient's neurologist is opposed.  Continue Symbicort 160-4.5, 2 inhalations via spacer device twice daily.  Continue Flovent 2 inhalations via spacer device twice daily.  Continue montelukast 10 mg daily at bedtime.  Continue albuterol HFA, 1 to 2 inhalations every 4-6 hours if needed.  Subjective and objective measures of pulmonary function will be followed and the treatment plan will be adjusted accordingly.

## 2019-12-05 NOTE — Assessment & Plan Note (Signed)
   A prescription has been provided for Dexilant 60 mg daily, 30 minutes prior to breakfast.  Continue appropriate reflux lifestyle modifications and famotidine (Pepcid) 20 mg twice daily.  Follow-up with gastroenterology for further evaluation and treatment suggestions.

## 2019-12-05 NOTE — Progress Notes (Signed)
Follow-up Note  RE: Deanna Rowe MRN: [id number] DOB: 1956/01/24 Date of Office Visit: 12/05/2019  Primary care provider: Leighton Ruff, MD Referring provider: Leighton Ruff, MD  History of present illness: Deanna Rowe is a 64 y.o. female with persistent asthma, allergic rhinitis, gastroesophageal reflux, and history of persistent cough presenting today for sick visit.  She reports that recently she has been awakening a few nights out of the week due to wheezing and coughing.  The cough persist throughout the day and seems to be worse with hot/muggy weather. She is currently taking Symbicort 160 g, 2 inhalations via spacer device twice a day, Flovent 2 inhalations via spacer twice twice a day, and montelukast 10 mg daily at bedtime.  During her last visit, she was prescribed Trelegy Ellipta, however after 1 inhalation she felt like her throat was closing, therefore this medication was immediately discontinued. She reports that she is still experiencing thick postnasal drainage, "but not as bad" as it was previously.  She is attempting to control her gastroesophageal reflux with famotidine 20 mg twice daily.  During her last visit, she was prescribed Dexilant, however for some reason this medication was not picked up from the pharmacy. She is being treated by her neurologist, Dr. Jannifer Franklin, for Parkinson's disease.  Assessment and plan: Moderate persistent asthma Currently with suboptimal control.  We are unable to start LAMA because the patient had a reaction to Trelegy but cannot tolerate ICS and LABA.  We will plan to start Xolair injections every 4 weeks, unless the patient's neurologist is opposed.  Continue Symbicort 160-4.5, 2 inhalations via spacer device twice daily.  Continue Flovent 2 inhalations via spacer device twice daily.  Continue montelukast 10 mg daily at bedtime.  Continue albuterol HFA, 1 to 2 inhalations every 4-6 hours if  needed.  Subjective and objective measures of pulmonary function will be followed and the treatment plan will be adjusted accordingly.  Gastroesophageal reflux disease  A prescription has been provided for Dexilant 60 mg daily, 30 minutes prior to breakfast.  Continue appropriate reflux lifestyle modifications and famotidine (Pepcid) 20 mg twice daily.  Follow-up with gastroenterology for further evaluation and treatment suggestions.  Perennial allergic rhinitis  Continue appropriate aeroallergen avoidance measures.  Continue azelastine nasal spray, 1-2 sprays per nostril 2 times daily as needed.   Nasal saline lavage (NeilMed) has been recommended as needed and prior to medicated nasal sprays along with instructions for proper administration.  For thick post nasal drainage, add guaifenesin 1200 mg (Mucinex Maximum Strength)  twice daily as needed with adequate hydration as discussed.  Cough, persistent  Treatment plan as outlined above.   Meds ordered this encounter  Medications  . Dexlansoprazole 30 MG capsule    Sig: Take 1 capsule (30 mg total) by mouth daily.    Dispense:  30 capsule    Refill:  2    Diagnostics: Spirometry reveals an FVC of 2.20 L and an FEV1 of 1.76 L (80% predicted) without postbronchodilator improvement.  This study was performed while the patient was asymptomatic.  Please see scanned spirometry results for details.    Physical examination: Blood pressure 120/66, pulse 65, temperature 98 F (36.7 C), temperature source Oral, resp. rate 16, SpO2 97 %.  General: Alert, interactive, in no acute distress. HEENT: TMs pearly gray, turbinates moderately edematous without discharge, post-pharynx moderately erythematous. Neck: Supple without lymphadenopathy. Lungs: Clear to auscultation without wheezing, rhonchi or rales. CV: Normal S1, S2 without murmurs. Skin: Warm and  dry, without lesions or rashes.  The following portions of the patient's history  were reviewed and updated as appropriate: allergies, current medications, past family history, past medical history, past social history, past surgical history and problem list.  Current Outpatient Medications  Medication Sig Dispense Refill  . ACCU-CHEK FASTCLIX LANCETS MISC USE TO CHECK BLOOD SUGAR 3 TIMES A DAY 102 each 0  . ACCU-CHEK GUIDE test strip USE TO CHECK BLOOD SUGAR 3 TIMES PER DAY. (Patient taking differently: Use to check blood sugar 4 times per day.) 100 each 2  . albuterol (PROVENTIL HFA;VENTOLIN HFA) 108 (90 Base) MCG/ACT inhaler Inhale 2 puffs into the lungs as needed for wheezing or shortness of breath. 18 g 2  . amLODipine (NORVASC) 5 MG tablet Take 5 mg by mouth every morning.     Marland Kitchen aspirin EC 81 MG tablet Take 81 mg by mouth every evening.    Marland Kitchen atorvastatin (LIPITOR) 10 MG tablet Take 10 mg by mouth at bedtime.  4  . azelastine (ASTELIN) 0.1 % nasal spray Place 2 sprays into both nostrils 2 (two) times daily. Use in each nostril as directed 30 mL 5  . Brexpiprazole (REXULTI) 2 MG TABS Take 2 mg by mouth daily. (Patient taking differently: Take 1 mg by mouth daily. ) 30 tablet   . budesonide-formoterol (SYMBICORT) 160-4.5 MCG/ACT inhaler 2 puffs twice a day with spacer 1 Inhaler 5  . buPROPion (WELLBUTRIN XL) 150 MG 24 hr tablet Take 450 mg by mouth every morning.     . Calcium Carbonate-Vitamin D (CALCIUM 600+D) 600-400 MG-UNIT tablet Take 1 tablet by mouth daily.    . carbidopa-levodopa (SINEMET CR) 50-200 MG tablet Take 1 tablet by mouth in the morning, at noon, in the evening, and at bedtime. 360 tablet 1  . fluticasone (FLONASE) 50 MCG/ACT nasal spray Place 2 sprays into both nostrils daily. 1 g 5  . furosemide (LASIX) 20 MG tablet Take 20 mg by mouth every other day.    . gabapentin (NEURONTIN) 300 MG capsule Take 300 mg by mouth 2 (two) times daily.     . Insulin Glargine (BASAGLAR KWIKPEN) 100 UNIT/ML Inject 0.4 mLs (40 Units total) into the skin daily. 5 pen 0  .  insulin lispro (HUMALOG) 100 UNIT/ML injection SQ - 14 units at breakfast, 12 units at lunch, 16 units at supper 10 mL 0  . Lancets (FREESTYLE) lancets     . levocetirizine (XYZAL) 5 MG tablet TAKE 1 TABLET BY MOUTH EVERY EVENING 30 tablet 5  . LORazepam (ATIVAN) 2 MG tablet Take 2 mg by mouth 3 (three) times daily as needed.    Marland Kitchen losartan-hydrochlorothiazide (HYZAAR) 100-12.5 MG tablet Take 1 tablet by mouth daily.    . mirabegron ER (MYRBETRIQ) 50 MG TB24 tablet Take 50 mg by mouth daily.    . montelukast (SINGULAIR) 10 MG tablet TAKE 1 TABLET (10 MG TOTAL) BY MOUTH AT BEDTIME. 30 tablet 5  . Olopatadine HCl (PATADAY) 0.2 % SOLN Place 1 drop into both eyes daily as needed. 2.5 mL 5  . propranolol (INDERAL) 40 MG tablet TAKE 1 TABLET (40 MG TOTAL) BY MOUTH 2 (TWO) TIMES DAILY. 180 tablet 2  . rOPINIRole (REQUIP) 2 MG tablet Take 2 mg by mouth 2 (two) times daily.  3  . solifenacin (VESICARE) 10 MG tablet Take 10 mg by mouth daily.    . valACYclovir (VALTREX) 1000 MG tablet Take 500 mg by mouth daily.     . valACYclovir (VALTREX)  500 MG tablet Take 500 mg by mouth daily.    . vitamin C (ASCORBIC ACID) 500 MG tablet Take 500 mg by mouth daily.    . Vitamin D, Ergocalciferol, 2000 units CAPS Take 1 capsule by mouth daily. 2000 units daily.    Marland Kitchen vortioxetine HBr (TRINTELLIX) 20 MG TABS Take 20 mg by mouth daily.     Marland Kitchen zolpidem (AMBIEN) 5 MG tablet Take 5 mg by mouth at bedtime.     Marland Kitchen Dexlansoprazole 30 MG capsule Take 1 capsule (30 mg total) by mouth daily. 30 capsule 2  . famotidine (PEPCID) 20 MG tablet TAKE 1 TABLET (20 MG TOTAL) BY MOUTH 2 (TWO) TIMES DAILY. (Patient not taking: Reported on 12/05/2019) 60 tablet 2  . hydrochlorothiazide (HYDRODIURIL) 12.5 MG tablet Take 12.5 mg by mouth daily. (Patient not taking: Reported on 12/05/2019)     No current facility-administered medications for this visit.   Facility-Administered Medications Ordered in Other Visits  Medication Dose Route Frequency  Provider Last Rate Last Admin  . tranexamic acid (CYKLOKAPRON) topical -INTRAOP  2,000 mg Topical Once Cecilio Asper, [employer], PA-C        Allergies  Allergen Reactions  . Fluticasone-Salmeterol Anaphylaxis  . Codeine Nausea And Vomiting  . Fetzima [Levomilnacipran] Nausea And Vomiting  . Nsaids Other (See Comments)    Upset stomach   . Trulicity [Dulaglutide] Nausea And Vomiting    Significant and undesirably rapid (40 lbs) weight loss   . Xanax [Alprazolam] Other (See Comments)    disoriented  . Amoxicillin Rash    Has patient had a PCN reaction causing immediate rash, facial/tongue/throat swelling, SOB or lightheadedness with hypotension: Yes Has patient had a PCN reaction causing severe rash involving mucus membranes or skin necrosis: No Has patient had a PCN reaction that required hospitalization: No Has patient had a PCN reaction occurring within the last 10 years: No If all of the above answers are "NO", then may proceed with Cephalosporin use.   . Pseudoephedrine Hcl Er Palpitations   Review of systems: Review of systems negative except as noted in HPI / PMHx.  Past Medical History:  Diagnosis Date  . Anemia   . Anxiety   . Arthritis    L HIP  . Asthma   . Avascular necrosis of hip (HCC)    LEFT  . Back pain   . Chest pain   . Constipation   . Depression   . Diabetes mellitus   . Diabetes mellitus, type II ([place])   . Dry cough   . Edema, lower extremity   . Gait abnormality 11/18/2019  . GERD (gastroesophageal reflux disease)   . High cholesterol   . Hypertension   . Insomnia    takes Ambien nightly  . Joint pain   . Kidney disease   . Kidney disease   . Obesity   . Parkinsonism ([place]) 03/02/2017  . PONV (postoperative nausea and vomiting)   . RLS (restless legs syndrome) 01/15/2018  . Sleep apnea   . Swallowing difficulty   . Thyroid disease   . TIA (transient ischemic attack) 2012   no residual problems  . Urgency incontinence   . Vitamin D deficiency      Family History  Problem Relation Age of Onset  . Alcohol abuse Brother   . Alcohol abuse Brother   . Alcohol abuse Brother   . Alcohol abuse Brother   . High blood pressure Mother   . Hyperlipidemia Mother   . Heart disease  Mother   . Heart disease Father   . Alcoholism Father   . Allergic rhinitis Sister   . Diabetes Neg Hx   . Angioedema Neg Hx   . Asthma Neg Hx   . Eczema Neg Hx   . Immunodeficiency Neg Hx   . Urticaria Neg Hx   . Breast cancer Neg Hx     Social History   Socioeconomic History  . Marital status: Married    Spouse name: Simona Huh  . Number of children: Not on file  . Years of education: Not on file  . Highest education level: Not on file  Occupational History  . Occupation: retired Therapist, sports  Tobacco Use  . Smoking status: Never Smoker  . Smokeless tobacco: Never Used  Vaping Use  . Vaping Use: Never used  Substance and Sexual Activity  . Alcohol use: No    Alcohol/week: 0.0 standard drinks  . Drug use: No  . Sexual activity: Not Currently  Other Topics Concern  . Not on file  Social History Narrative   Lives with husband   Caffeine use: none   Right handed    Social Determinants of Health   Financial Resource Strain:   . Difficulty of Paying Living Expenses: Not on file  Food Insecurity:   . Worried About Charity fundraiser in the Last Year: Not on file  . Ran Out of Food in the Last Year: Not on file  Transportation Needs:   . Lack of Transportation (Medical): Not on file  . Lack of Transportation (Non-Medical): Not on file  Physical Activity:   . Days of Exercise per Week: Not on file  . Minutes of Exercise per Session: Not on file  Stress:   . Feeling of Stress : Not on file  Social Connections:   . Frequency of Communication with Friends and Family: Not on file  . Frequency of Social Gatherings with Friends and Family: Not on file  . Attends Religious Services: Not on file  . Active Member of Clubs or Organizations: Not on file   . Attends Archivist Meetings: Not on file  . Marital Status: Not on file  Intimate Partner Violence:   . Fear of Current or Ex-Partner: Not on file  . Emotionally Abused: Not on file  . Physically Abused: Not on file  . Sexually Abused: Not on file    I appreciate the opportunity to take part in Kadee's care. Please do not hesitate to contact me with questions.  Sincerely,   R. Edgar Frisk, MD

## 2019-12-05 NOTE — Assessment & Plan Note (Signed)
   Continue appropriate aeroallergen avoidance measures.  Continue azelastine nasal spray, 1-2 sprays per nostril 2 times daily as needed.   Nasal saline lavage (NeilMed) has been recommended as needed and prior to medicated nasal sprays along with instructions for proper administration.  For thick post nasal drainage, add guaifenesin 1200 mg (Mucinex Maximum Strength)  twice daily as needed with adequate hydration as discussed.

## 2019-12-05 NOTE — Patient Instructions (Addendum)
Moderate persistent asthma Currently with suboptimal control.  We are unable to start LAMA because the patient had a reaction to Trelegy but cannot tolerate ICS and LABA.  We will plan to start Xolair injections every 4 weeks, unless the patient's neurologist is opposed.  Continue Symbicort 160-4.5, 2 inhalations via spacer device twice daily.  Continue Flovent 2 inhalations via spacer device twice daily.  Continue montelukast 10 mg daily at bedtime.  Continue albuterol HFA, 1 to 2 inhalations every 4-6 hours if needed.  Subjective and objective measures of pulmonary function will be followed and the treatment plan will be adjusted accordingly.  Gastroesophageal reflux disease  A prescription has been provided for Dexilant 60 mg daily, 30 minutes prior to breakfast.  Continue appropriate reflux lifestyle modifications and famotidine (Pepcid) 20 mg twice daily.  Follow-up with gastroenterology for further evaluation and treatment suggestions.  Perennial allergic rhinitis  Continue appropriate aeroallergen avoidance measures.  Continue azelastine nasal spray, 1-2 sprays per nostril 2 times daily as needed.   Nasal saline lavage (NeilMed) has been recommended as needed and prior to medicated nasal sprays along with instructions for proper administration.  For thick post nasal drainage, add guaifenesin 1200 mg (Mucinex Maximum Strength)  twice daily as needed with adequate hydration as discussed.  Cough, persistent  Treatment plan as outlined above.   Return in about 3 months (around 03/05/2020), or if symptoms worsen or fail to improve.

## 2019-12-06 ENCOUNTER — Telehealth: Payer: Self-pay | Admitting: *Deleted

## 2019-12-06 ENCOUNTER — Telehealth: Payer: Self-pay | Admitting: Neurology

## 2019-12-06 MED FILL — LORazepam 2 MG TABS: 2 | 90 days supply | Qty: 270 | Fill #0

## 2019-12-06 NOTE — Telephone Encounter (Signed)
-----   Message from Adelina Mings, MD sent at 12/05/2019 10:28 PM EDT ----- Regarding: Deanna Rowe - this patient's asthma is poorly controlled.  She does not have the elevated eosinophil count and has an IgE level of 58.  Is that IgE level high enough to qualify her for Xolair injections for asthma?  If so, let us proceed with that.  She is unable to take a LAMA and has had hip necrosis, therefore oral steroids are certainly not a great option.Let me know.Thanks.

## 2019-12-06 NOTE — Telephone Encounter (Signed)
Pt called stating that Bobbitt, Sedalia Muta, MD would like her to start on Xolair Inj. and she would like to know if provider thinks this will be ok. Please advise.

## 2019-12-06 NOTE — Telephone Encounter (Signed)
I called the patient.  I see no contraindication to using the Xolair with the Parkinson's.  Xolair is a monoclonal antibody that blocks binding of IgE to mast cells and eosinophils.  There are no indications that it has any adverse effect on Parkinson's.

## 2019-12-06 NOTE — Telephone Encounter (Signed)
Patient had reached out to me to advise her Neurologist is ok with Xolair for her asthma and explained the process of approval, copay card and submit to patient. Will reach out once ready to send Rx to [facility] and let her know

## 2019-12-06 NOTE — Telephone Encounter (Signed)
Received PA request for Dexilant- insurance requires a trial of omeprazole, lansoprazole, pantoprazole in the past 120 days.   Pt has used Famotidine and Pantoprazole in the past.   PA submitted through cover my meds.

## 2019-12-10 ENCOUNTER — Other Ambulatory Visit: Payer: Self-pay

## 2019-12-10 ENCOUNTER — Ambulatory Visit (HOSPITAL_BASED_OUTPATIENT_CLINIC_OR_DEPARTMENT_OTHER): Payer: 59 | Admitting: Pharmacist

## 2019-12-10 ENCOUNTER — Other Ambulatory Visit: Payer: Self-pay | Admitting: Internal Medicine

## 2019-12-10 DIAGNOSIS — Z79899 Other long term (current) drug therapy: Secondary | ICD-10-CM

## 2019-12-10 MED ORDER — XOLAIR 150 MG ~~LOC~~ SOLR: 300.0000 mg | SUBCUTANEOUS | 11 refills | Status: DC

## 2019-12-10 MED FILL — DEXILANT DR 30 MG CAPSULE: 30 | 30 days supply | Qty: 30 | Fill #0

## 2019-12-10 NOTE — Addendum Note (Signed)
Addended by: Carin Hock on: 12/10/2019 12:10 PM   Modules accepted: Orders

## 2019-12-10 NOTE — Telephone Encounter (Signed)
PA approved for dexilant. Faxed approval to pharmacy.

## 2019-12-10 NOTE — Progress Notes (Signed)
S: Patient presents for review of their specialty medication therapy.  Patient is currently taking Xolair for asthma. Patient is managed by Dr. Doreene Burke for this.   Adherence: has not yet started    Efficacy: has not yet started   Dosing: Give subcutaneously.  Can be dosed every 2 or 4 weeks based on baseline serum IgE levels and body weight.  Asthma: SubQ: Dose and frequency based on body weight and pretreatment total IgE serum levels. Dosing should be adjusted during therapy for significant changes in body weight. Dosing should not be adjusted based on total IgE levels taken during treatment or <1 year following interruption of therapy. If therapy has been interrupted for ?1 year, total IgE levels may be re-evaluated for dosage determination.  Pretreatment serum IgE ?30 to 100 units/mL:   >90 to 150 kg: 300 mg every 4 weeks  Dose adjustments: Renal: no dose adjustments  Hepatic: no dose adjustments  Toxicity: Severe hypersensitivity reaction or anaphylaxis: Discontinue treatment. Fever, arthralgia, and rash: Discontinue treatment if this constellation of symptoms occurs.  Drug-drug interactions: none identified   Monitoring: CV effects: has not yet started  Eosinophilia and vasculitis: has not yet started Fever/arthralgia/rash: has not yet started Hypersensitivity/Anaphylaxis: has not yet started Malignant neoplasms: has not yet started Pulmonary function tests (for asthma): has not yet started  O: Lab Results  Component Value Date   WBC 6.8 04/16/2019   HGB 12.3 04/16/2019   HCT 36.1 04/16/2019   MCV 96 04/16/2019   PLT 274 04/16/2019      Chemistry      Component Value Date/Time   NA 142 04/16/2019 1451   NA 142 01/12/2017 0914   K 4.5 04/16/2019 1451   K 4.2 01/12/2017 0914   CL 103 04/16/2019 1451   CO2 27 04/16/2019 1451   CO2 28 01/12/2017 0914   BUN 11 04/16/2019 1451   BUN 12.0 01/12/2017 0914   CREATININE 1.10 (H) 04/16/2019 1451   CREATININE 1.58  (H) 10/12/2018 1121   CREATININE 1.3 (H) 01/12/2017 0914      Component Value Date/Time   CALCIUM 9.3 04/16/2019 1451   CALCIUM 9.6 01/12/2017 0914   ALKPHOS 58 04/16/2019 1451   ALKPHOS 43 01/12/2017 0914   AST 23 04/16/2019 1451   AST 16 10/12/2018 1121   AST 19 01/12/2017 0914   ALT 21 04/16/2019 1451   ALT <6 10/12/2018 1121   ALT 18 01/12/2017 0914   BILITOT 0.3 04/16/2019 1451   BILITOT 0.2 (L) 10/12/2018 1121   BILITOT 0.28 01/12/2017 0914       A/P: 1. Medication review: patient currently on Xolair for asthma. Reviewed the medication with the patient, including the following: Xolair, omalizumab, is a novel IgE blocker.  It appears to reduce rates of hospitalizations, ER visits and unscheduled physician visits due asthma exacerbations when added to standard therapy.  Studies also show a reduction in steroid requirements and improvement in quality of life.  Patient educated on purpose, proper use and potential adverse effects of Xolair.  Following instruction patient verbalized understanding. Patient should always have an EpiPen readily available in the event of anaphylaxis. SubQ: For SubQ injection only; doses >150 mg should be divided over more than one injection site (eg, 225 mg or 300 mg administered as two injections, 375 mg administered as three injections); each injection site should be separated by ?1 inch. Do not inject into moles, scars, bruises, tender areas, or broken skin. Injections may take 5 to 10  seconds to administer (solution is slightly viscous). Administer only under direct medical supervision and observe patient for 2 hours after the first 3 injections and 30 minutes after subsequent injections Dellia Cloud 2015) or in accordance with individual institution policies and procedures.No recommendations for any changes at this time.   Benard Halsted, PharmD, [place] [phone]

## 2019-12-10 NOTE — Telephone Encounter (Signed)
L/M for patient to advise approval, copay card and submit to [facility]

## 2019-12-10 NOTE — Telephone Encounter (Signed)
Pt informed

## 2019-12-10 NOTE — Telephone Encounter (Signed)
Spoke to patient and advised Rx to [place] an d once received to contact clinic for appt to start and store same in [place]

## 2019-12-11 MED FILL — XOLAIR 150 MG SOLR: 150 | 28 days supply | Qty: 2 | Fill #0

## 2019-12-13 ENCOUNTER — Other Ambulatory Visit: Payer: Self-pay

## 2019-12-13 ENCOUNTER — Ambulatory Visit (INDEPENDENT_AMBULATORY_CARE_PROVIDER_SITE_OTHER): Payer: 59 | Admitting: *Deleted

## 2019-12-13 DIAGNOSIS — J4540 Moderate persistent asthma, uncomplicated: Secondary | ICD-10-CM

## 2019-12-13 MED ORDER — EPINEPHRINE 0.3 MG/0.3ML IJ SOAJ: 0.3000 mg | INTRAMUSCULAR | 1 refills | Status: DC | PRN

## 2019-12-13 MED ORDER — OMALIZUMAB 150 MG ~~LOC~~ SOLR
300.0000 mg | SUBCUTANEOUS | Status: DC
  Administered 2019-12-13 – 2020-07-16 (×8): 300 mg via SUBCUTANEOUS

## 2019-12-13 MED FILL — EPINEPHRINE 0.3 MG AUTO-INJ: 0.3 | 2 days supply | Qty: 2 | Fill #0

## 2019-12-17 ENCOUNTER — Ambulatory Visit (INDEPENDENT_AMBULATORY_CARE_PROVIDER_SITE_OTHER): Payer: 59 | Admitting: Family Medicine

## 2019-12-17 ENCOUNTER — Telehealth: Payer: Self-pay | Admitting: Nutrition

## 2019-12-17 ENCOUNTER — Other Ambulatory Visit: Payer: Self-pay

## 2019-12-17 ENCOUNTER — Telehealth: Payer: Self-pay | Admitting: Allergy and Immunology

## 2019-12-17 ENCOUNTER — Encounter (INDEPENDENT_AMBULATORY_CARE_PROVIDER_SITE_OTHER): Payer: Self-pay | Admitting: Family Medicine

## 2019-12-17 ENCOUNTER — Other Ambulatory Visit: Payer: Self-pay | Admitting: Allergy and Immunology

## 2019-12-17 VITALS — BP 111/68 | HR 62 | Temp 98.0°F | Ht 66.0 in | Wt 238.0 lb

## 2019-12-17 DIAGNOSIS — Z6838 Body mass index (BMI) 38.0-38.9, adult: Secondary | ICD-10-CM

## 2019-12-17 DIAGNOSIS — E559 Vitamin D deficiency, unspecified: Secondary | ICD-10-CM

## 2019-12-17 DIAGNOSIS — Z794 Long term (current) use of insulin: Secondary | ICD-10-CM | POA: Diagnosis not present

## 2019-12-17 DIAGNOSIS — Z9189 Other specified personal risk factors, not elsewhere classified: Secondary | ICD-10-CM

## 2019-12-17 DIAGNOSIS — E1142 Type 2 diabetes mellitus with diabetic polyneuropathy: Secondary | ICD-10-CM | POA: Diagnosis not present

## 2019-12-17 MED ORDER — DEXILANT 60 MG PO CPDR: 60.0000 mg | DELAYED_RELEASE_CAPSULE | Freq: Every day | ORAL | 5 refills | Status: DC

## 2019-12-17 MED FILL — DEXILANT DR 60 MG CAPSULE: 60 | 30 days supply | Qty: 30 | Fill #0

## 2019-12-17 NOTE — Telephone Encounter (Signed)
Sent in dexilant 60 mg. I will call [facility] to follow up and check coverage.

## 2019-12-17 NOTE — Telephone Encounter (Signed)
Spoke with pharmacy- canceled the 30mg  dose rx. No coverage issue with the 60mg . Pt can pick up that rx. Pt informed.

## 2019-12-17 NOTE — Telephone Encounter (Signed)
LVM to call back to schedule pump training.

## 2019-12-17 NOTE — Telephone Encounter (Signed)
PT & husband called to get clarification on medication. States the AVS says to take 60mg  of dexilant before breakfast but the rx was written for 30mg  1x daily. Will need new rx sent to pharmacy.

## 2019-12-18 ENCOUNTER — Encounter (INDEPENDENT_AMBULATORY_CARE_PROVIDER_SITE_OTHER): Payer: Self-pay | Admitting: Family Medicine

## 2019-12-18 LAB — VITAMIN D 25 HYDROXY (VIT D DEFICIENCY, FRACTURES): Vit D, 25-Hydroxy: 44.8 ng/mL (ref 30.0–100.0)

## 2019-12-18 NOTE — Progress Notes (Signed)
Chief Complaint:   OBESITY Deanna Rowe is here to discuss her progress with her obesity treatment plan along with follow-up of her obesity related diagnoses. Deanna Rowe is on the Category 3 Plan and states she is following her eating plan approximately 50% of the time. Deanna Rowe states she is walking for 45 minutes 3 times per week.  Today's visit was #: 16 Starting weight: 237 lbs Starting date: 04/16/2019 Today's weight: 238 lbs Today's date: 12/17/2019 Total lbs lost to date: 0 Total lbs lost since last in-office visit: 1  Interim History: Deanna Rowe continues to go to the gym at [employer] and walk on the treadmill 3 times per week. She is no longer skipping lunch. She continues to eat out 2 times per week. One meal is fried fish. She has tried to decrease carbohydrates overall. She still tends to have sweet potato with butter when she goes out.   Subjective:   1. Type 2 diabetes mellitus with diabetic polyneuropathy, with long-term current use of insulin (HCC) Effie reports no hypoglycemia since basal insulin was decreased by Dr. Buddy Duty. Her CBGs range between 103 and 156. Last A1c was 7.6. She denies hypoglycemia.  Lab Results  Component Value Date   HGBA1C 7.9 (H) 04/16/2019   HGBA1C 6.9 11/25/2016   HGBA1C 6.0 06/10/2016   Lab Results  Component Value Date   MICROALBUR <0.7 02/12/2016   LDLCALC 57 04/16/2019   CREATININE 1.10 (H) 04/16/2019   Lab Results  Component Value Date   INSULIN 12.3 04/16/2019   2. Vitamin D deficiency Deanna Rowe's Vit D level was low at 40.9 at her last check on 04/16/2019. She is on OTC Vit D 2,000 IU daily.  3. At risk for osteoporosis Deanna Rowe is at higher risk of osteopenia and osteoporosis due to Vitamin D deficiency.   Assessment/Plan:   1. Type 2 diabetes mellitus with diabetic polyneuropathy, with long-term current use of insulin (HCC) alog).  2. Vitamin D deficiency  Deanna Rowe agreed to continue taking OTC Vit D 2,000 IU daily.  -  VITAMIN D 25 Hydroxy (Vit-D Deficiency, Fractures)  3. At risk for osteoporosis Deanna Rowe was given approximately 15 minutes of osteoporosis prevention counseling today. Deanna Rowe is at risk for osteopenia and osteoporosis due to her Vitamin D deficiency. She was encouraged to take her Vitamin D and follow her higher calcium diet and increase strengthening exercise to help strengthen her bones and decrease her risk of osteopenia and osteoporosis.  Repetitive spaced learning was employed today to elicit superior memory formation and behavioral change.  4. Class 2 severe obesity with serious comorbidity and body mass index (BMI) of 38.0 to 38.9 in adult, unspecified obesity type (HCC) Deanna Rowe is currently in the action stage of change. As such, her goal is to continue with weight loss efforts. She has agreed to the Category 3 Plan.   Handout given today: Recipes. We discussed bariatric surgery and it's advantages for diabetes mellitus.  Exercise goals: As is.  Behavioral modification strategies: increasing lean protein intake and meal planning and cooking strategies.  Deanna Rowe has agreed to follow-up with our clinic in 3 weeks. She was informed of the importance of frequent follow-up visits to maximize her success with intensive lifestyle modifications for her multiple health conditions.   Objective:   Blood pressure 111/68, pulse 62, temperature 98 F (36.7 C), temperature source Oral, height 5\' 6"  (1.676 m), weight 238 lb (108 kg), SpO2 97 %. Body mass index is 38.41 kg/m.  General: Cooperative, alert, well  developed, in no acute distress. HEENT: Conjunctivae and lids unremarkable. Cardiovascular: Regular rhythm.  Lungs: Normal work of breathing. Neurologic: No focal deficits.   Lab Results  Component Value Date   CREATININE 1.10 (H) 04/16/2019   BUN 11 04/16/2019   NA 142 04/16/2019   K 4.5 04/16/2019   CL 103 04/16/2019   CO2 27 04/16/2019   Lab Results  Component Value Date    ALT 21 04/16/2019   AST 23 04/16/2019   ALKPHOS 58 04/16/2019   BILITOT 0.3 04/16/2019   Lab Results  Component Value Date   HGBA1C 7.9 (H) 04/16/2019   HGBA1C 6.9 11/25/2016   HGBA1C 6.0 06/10/2016   HGBA1C 5.9 02/12/2016   HGBA1C 6.1 12/01/2015   Lab Results  Component Value Date   INSULIN 12.3 04/16/2019   Lab Results  Component Value Date   TSH 1.780 04/16/2019   Lab Results  Component Value Date   CHOL 155 04/16/2019   HDL 84 04/16/2019   LDLCALC 57 04/16/2019   TRIG 71 04/16/2019   Lab Results  Component Value Date   WBC 6.8 04/16/2019   HGB 12.3 04/16/2019   HCT 36.1 04/16/2019   MCV 96 04/16/2019   PLT 274 04/16/2019   No results found for: IRON, TIBC, FERRITIN  Attestation Statements:   Reviewed by clinician on day of visit: allergies, medications, problem list, medical history, surgical history, family history, social history, and previous encounter notes.   Wilhemena Durie, am acting as Location manager for [employer], FNP-C.  I have reviewed the above documentation for accuracy and completeness, and I agree with the above. -  Georgianne Fick, FNP

## 2019-12-23 ENCOUNTER — Other Ambulatory Visit (HOSPITAL_BASED_OUTPATIENT_CLINIC_OR_DEPARTMENT_OTHER): Payer: Self-pay | Admitting: Family Medicine

## 2019-12-23 MED FILL — SOLIFENACIN SUCCINATE 10 MG: 10 | 30 days supply | Qty: 30 | Fill #7

## 2019-12-23 MED FILL — TRINTELLIX 20 MG TABLET: 20 | 90 days supply | Qty: 90 | Fill #1

## 2019-12-23 MED FILL — FAMOTIDINE 20 MG TABS: 20 | 30 days supply | Qty: 60 | Fill #1

## 2019-12-23 MED FILL — REXULTI 2 MG TABLET: 2 | 90 days supply | Qty: 90 | Fill #0

## 2019-12-23 MED FILL — MONTELUKAST SOD 10 MG TAB: 10 | 30 days supply | Qty: 30 | Fill #4

## 2019-12-24 ENCOUNTER — Other Ambulatory Visit: Payer: Self-pay

## 2019-12-24 ENCOUNTER — Encounter: Payer: 59 | Attending: Internal Medicine | Admitting: Nutrition

## 2019-12-24 DIAGNOSIS — E1165 Type 2 diabetes mellitus with hyperglycemia: Secondary | ICD-10-CM | POA: Diagnosis not present

## 2019-12-25 NOTE — Patient Instructions (Signed)
Read over booklet on how to use/replace sensors Apply a new sensor every 10 days Apply a transmitter every 3 months.

## 2019-12-25 NOTE — Progress Notes (Signed)
Patient was identified by name and DOB.  She is her to started on the Dexcom CGM.   We discussed the difference between sensor readings and blood sugar readings and she reported good understanding of this.   Her Dexcom was linked to her phone and applied to her upper outer right arm.  Discussed the importance of sensor placement and the need to not inject insulin 4 inches around this area. She had no final questions.

## 2019-12-27 MED FILL — MYRBETRIQ ER 50 MG TABLET: 50 | 30 days supply | Qty: 30 | Fill #5

## 2019-12-30 MED FILL — AMLODIPINE BESYLATE 5 MG TA: 5 | 90 days supply | Qty: 90 | Fill #0

## 2020-01-02 DIAGNOSIS — Z20822 Contact with and (suspected) exposure to covid-19: Secondary | ICD-10-CM | POA: Diagnosis not present

## 2020-01-10 ENCOUNTER — Ambulatory Visit: Payer: Self-pay

## 2020-01-13 DIAGNOSIS — R3914 Feeling of incomplete bladder emptying: Secondary | ICD-10-CM | POA: Diagnosis not present

## 2020-01-13 DIAGNOSIS — R350 Frequency of micturition: Secondary | ICD-10-CM | POA: Diagnosis not present

## 2020-01-13 DIAGNOSIS — R8271 Bacteriuria: Secondary | ICD-10-CM | POA: Diagnosis not present

## 2020-01-13 DIAGNOSIS — N3941 Urge incontinence: Secondary | ICD-10-CM | POA: Diagnosis not present

## 2020-01-13 MED FILL — XOLAIR 150 MG SOLR: 150 | 28 days supply | Qty: 2 | Fill #1

## 2020-01-14 ENCOUNTER — Other Ambulatory Visit: Payer: Self-pay

## 2020-01-14 ENCOUNTER — Ambulatory Visit (INDEPENDENT_AMBULATORY_CARE_PROVIDER_SITE_OTHER): Payer: 59

## 2020-01-14 DIAGNOSIS — J4540 Moderate persistent asthma, uncomplicated: Secondary | ICD-10-CM | POA: Diagnosis not present

## 2020-01-14 MED FILL — LEVOCETIRIZINE 5 MG TABLET: 5 | 30 days supply | Qty: 30 | Fill #5

## 2020-01-16 ENCOUNTER — Encounter (INDEPENDENT_AMBULATORY_CARE_PROVIDER_SITE_OTHER): Payer: Self-pay | Admitting: Family Medicine

## 2020-01-16 ENCOUNTER — Ambulatory Visit (INDEPENDENT_AMBULATORY_CARE_PROVIDER_SITE_OTHER): Payer: 59 | Admitting: Family Medicine

## 2020-01-16 ENCOUNTER — Other Ambulatory Visit: Payer: Self-pay

## 2020-01-16 VITALS — BP 107/67 | HR 66 | Temp 97.7°F | Ht 66.0 in | Wt 237.0 lb

## 2020-01-16 DIAGNOSIS — Z794 Long term (current) use of insulin: Secondary | ICD-10-CM | POA: Diagnosis not present

## 2020-01-16 DIAGNOSIS — E1142 Type 2 diabetes mellitus with diabetic polyneuropathy: Secondary | ICD-10-CM | POA: Diagnosis not present

## 2020-01-16 DIAGNOSIS — Z6838 Body mass index (BMI) 38.0-38.9, adult: Secondary | ICD-10-CM

## 2020-01-17 ENCOUNTER — Ambulatory Visit (HOSPITAL_COMMUNITY)
Admission: EM | Admit: 2020-01-17 | Discharge: 2020-01-17 | Disposition: A | Discharge status: Home / Self Care | Payer: 59 | Attending: Family Medicine | Admitting: Family Medicine

## 2020-01-17 ENCOUNTER — Ambulatory Visit (INDEPENDENT_AMBULATORY_CARE_PROVIDER_SITE_OTHER): Payer: 59

## 2020-01-17 ENCOUNTER — Ambulatory Visit (HOSPITAL_COMMUNITY): Payer: 59

## 2020-01-17 ENCOUNTER — Other Ambulatory Visit: Payer: Self-pay

## 2020-01-17 ENCOUNTER — Encounter (HOSPITAL_COMMUNITY): Payer: Self-pay

## 2020-01-17 DIAGNOSIS — M542 Cervicalgia: Secondary | ICD-10-CM

## 2020-01-17 DIAGNOSIS — W19XXXA Unspecified fall, initial encounter: Secondary | ICD-10-CM

## 2020-01-17 DIAGNOSIS — R0789 Other chest pain: Secondary | ICD-10-CM | POA: Diagnosis not present

## 2020-01-17 MED ORDER — HYDROCODONE-ACETAMINOPHEN 5-325 MG PO TABS
ORAL_TABLET | ORAL | Status: AC
  Filled 2020-01-17: qty 1

## 2020-01-17 MED ORDER — HYDROCODONE-ACETAMINOPHEN 5-325 MG PO TABS
1.0000 | ORAL_TABLET | Freq: Once | ORAL | Status: AC
  Administered 2020-01-17: 1 via ORAL

## 2020-01-17 MED ORDER — HYDROCODONE-ACETAMINOPHEN 5-325 MG PO TABS
1.0000 | ORAL_TABLET | Freq: Four times a day (QID) | ORAL | 0 refills | Status: AC | PRN
Start: 2020-01-17 — End: 2020-01-19

## 2020-01-17 MED FILL — FAMOTIDINE 20 MG TABS: 20 | 30 days supply | Qty: 60 | Fill #2

## 2020-01-17 MED FILL — SOLIFENACIN SUCCINATE 10 MG: 10 | 30 days supply | Qty: 30 | Fill #8

## 2020-01-17 MED FILL — DEXILANT DR 60 MG CAPSULE: 60 | 30 days supply | Qty: 30 | Fill #1

## 2020-01-17 NOTE — ED Provider Notes (Signed)
Emergency Department Provider Note  ____________________________________________  Time seen: Approximately 5:10 PM  I have reviewed the triage vital signs and the nursing notes.   HISTORY  Chief Complaint Back Pain (upper back x 4 days from fall), Chest Pain (x 4 days from fall), and Arm Injury (x4 days, painful to articulate or raise)   Historian Patient     HPI Deanna Rowe is a 64 y.o. female presents to the emergency department after patient fell.  Patient states that she was walking down the street when she lost her footing and fell against a metal rail.  Patient states that she did hit her head against the rail but denies loss of consciousness.  She denies dizziness or current headache.  She is primarily experiencing pain along the left shoulder and the anterior chest wall.  She states that pain is worsened with movement relieved with rest.  She denies chest tightness or shortness of breath.  No abdominal pain.  No abrasions or lacerations.   Past Medical History:  Diagnosis Date  . Anemia   . Anxiety   . Arthritis    L HIP  . Asthma   . Avascular necrosis of hip (HCC)    LEFT  . Back pain   . Chest pain   . Constipation   . Depression   . Diabetes mellitus   . Diabetes mellitus, type II ([place])   . Dry cough   . Edema, lower extremity   . Gait abnormality 11/18/2019  . GERD (gastroesophageal reflux disease)   . High cholesterol   . Hypertension   . Insomnia    takes Ambien nightly  . Joint pain   . Kidney disease   . Kidney disease   . Obesity   . Parkinsonism ([place]) 03/02/2017  . PONV (postoperative nausea and vomiting)   . RLS (restless legs syndrome) 01/15/2018  . Sleep apnea   . Swallowing difficulty   . Thyroid disease   . TIA (transient ischemic attack) 2012   no residual problems  . Urgency incontinence   . Vitamin D deficiency      Immunizations up to date:  Yes.     Past Medical History:  Diagnosis Date  . Anemia   .  Anxiety   . Arthritis    L HIP  . Asthma   . Avascular necrosis of hip (HCC)    LEFT  . Back pain   . Chest pain   . Constipation   . Depression   . Diabetes mellitus   . Diabetes mellitus, type II ([place])   . Dry cough   . Edema, lower extremity   . Gait abnormality 11/18/2019  . GERD (gastroesophageal reflux disease)   . High cholesterol   . Hypertension   . Insomnia    takes Ambien nightly  . Joint pain   . Kidney disease   . Kidney disease   . Obesity   . Parkinsonism ([place]) 03/02/2017  . PONV (postoperative nausea and vomiting)   . RLS (restless legs syndrome) 01/15/2018  . Sleep apnea   . Swallowing difficulty   . Thyroid disease   . TIA (transient ischemic attack) 2012   no residual problems  . Urgency incontinence   . Vitamin D deficiency     Patient Active Problem List   Diagnosis Date Noted  . Gait abnormality 11/18/2019  . OSA on CPAP 06/04/2019  . Pain in joint of left shoulder 03/28/2019  . Seasonal allergic conjunctivitis 02/07/2019  .  Chronic intermittent hypoxia with obstructive sleep apnea 01/25/2019  . Severe obstructive sleep apnea 01/25/2019  . Excessive daytime sleepiness 01/03/2019  . Non-restorative sleep 01/03/2019  . Nocturia more than twice per night 01/03/2019  . Weight gain, abnormal 01/03/2019  . Snorings 01/03/2019  . Trochanteric bursitis of right hip 04/27/2018  . Encounter for routine screening for malformation using ultrasonics 02/08/2018  . Abnormal weight gain 02/08/2018  . Acid indigestion 02/08/2018  . Acute antritis 02/08/2018  . Adiposity 02/08/2018  . Perennial allergic rhinitis 02/08/2018  . Antiphospholipid syndrome ([place]) 02/08/2018  . Arthralgia of hip or thigh 02/08/2018  . Asthma, cough variant 02/08/2018  . Vitamin D deficiency 02/08/2018  . Backhand tennis elbow 02/08/2018  . Cannot sleep 02/08/2018  . Chronic kidney disease (CKD), stage III (moderate) ([place]) 02/08/2018  . Current drug use 02/08/2018  .  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 Diabetes mellitus with polyneuropathy ([place]) 02/08/2018  . Difficulty hearing 02/08/2018  . Disturbance of skin sensation 02/08/2018  . Hypertension associated with type 2 diabetes mellitus ([place]) 02/08/2018  . Extremity pain 02/08/2018  . Facet syndrome, lumbar 02/08/2018  . Factor V Leiden ([place]) 02/08/2018  . Fungal infection of nail 02/08/2018  . Gastroenteritis and colitis, viral 02/08/2018  . Hypoglycemia 02/08/2018  . Menopause 02/08/2018  . Other long term (current) drug therapy 02/08/2018  . Other osteonecrosis, unspecified femur ([place]) 02/08/2018  . Pure hypercholesterolemia 02/08/2018  . Recurrent major depressive episodes ([place]) 02/08/2018  . Syncope and collapse 02/08/2018  . Moderate persistent asthma 02/08/2018  . Cough, persistent 02/08/2018  . Restless leg syndrome 01/15/2018  . Degeneration of lumbar intervertebral disc 11/21/2017  . Tremor of right hand 03/02/2017  . Parkinson's disease ([place]) 03/02/2017  . UTI (urinary tract infection) 12/01/2015  . Type 2 diabetes mellitus with diabetic polyneuropathy, with long-term current use of insulin ([place]) 06/03/2015  . Fast heart beat 04/09/2015  . 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 2 diabetes mellitus ([place]) 04/24/2014  . Postoperative anemia due to acute blood loss 10/10/2013  . Avascular necrosis of bone of left hip ([place]) 10/09/2013  . Arthritis of pelvic region, degenerative 10/09/2013  . Breath shortness 06/18/2013  . Diabetes mellitus type 2, uncontrolled ([place]) 05/31/2013  . Intermittent claudication ([place]) 04/04/2013  . Anxiety   . Gastroesophageal reflux disease 09/12/2012  . Clinical depression 02/21/2012  . Stroke ([place]) 02/11/2012  . History of revision of total replacement of right hip joint 04/04/2009    Past Surgical History:  Procedure Laterality Date  . DILATION AND CURETTAGE OF UTERUS    . ESOPHAGEAL MANOMETRY N/A 09/03/2012   Procedure:  ESOPHAGEAL MANOMETRY (EM);  Surgeon: Garlan Fair, MD;  Location: [facility];  Service: Endoscopy;  Laterality: N/A;  . ESOPHAGEAL MANOMETRY N/A 06/19/2017   Procedure: ESOPHAGEAL MANOMETRY (EM);  Surgeon: Clarene Essex, MD;  Location: [facility];  Service: Endoscopy;  Laterality: N/A;  . EXPLORATORY LAPAROTOMY    . GANGLION CYST EXCISION    . JOINT REPLACEMENT  2011   rt total hip  . KNEE ARTHROSCOPY    . PILONIDAL CYST / SINUS EXCISION    . TOTAL HIP ARTHROPLASTY Left 10/09/2013   Procedure: LEFT TOTAL HIP ARTHROPLASTY ANTERIOR APPROACH;  Surgeon: [name], MD;  Location: [facility];  Service: Orthopedics;  Laterality: Left;    Prior to Admission medications   Medication Sig Start Date End Date Taking? Authorizing Provider  ACCU-CHEK FASTCLIX LANCETS MISC USE TO CHECK BLOOD SUGAR 3 TIMES  A DAY 02/27/18  Yes Renato Shin, MD  ACCU-CHEK GUIDE test strip USE TO CHECK BLOOD SUGAR 3 TIMES PER DAY. Patient taking differently: Use to check blood sugar 4 times per day. 02/02/17  Yes Renato Shin, MD  albuterol (PROVENTIL HFA;VENTOLIN HFA) 108 (90 Base) MCG/ACT inhaler Inhale 2 puffs into the lungs as needed for wheezing or shortness of breath. 02/08/18  Yes Bobbitt, [place] Muta, MD  amLODipine (NORVASC) 5 MG tablet Take 5 mg by mouth every morning.    Yes [provider]  aspirin EC 81 MG tablet Take 81 mg by mouth every evening.   Yes [provider]  atorvastatin (LIPITOR) 10 MG tablet Take 10 mg by mouth at bedtime. 12/05/17  Yes [provider]  buPROPion (WELLBUTRIN XL) 150 MG 24 hr tablet Take 450 mg by mouth every morning.    Yes [provider]  gabapentin (NEURONTIN) 300 MG capsule Take 300 mg by mouth 2 (two) times daily.    Yes [provider]  hydrochlorothiazide (HYDRODIURIL) 12.5 MG tablet Take 12.5 mg by mouth daily.    Yes [provider]  Insulin Glargine (BASAGLAR KWIKPEN) 100 UNIT/ML Inject 0.4 mLs (40 Units total) into  the skin daily. 11/05/19  Yes Whitmire, Dawn W, FNP  insulin lispro (HUMALOG) 100 UNIT/ML injection SQ - 14 units at breakfast, 12 units at lunch, 16 units at supper 11/05/19  Yes Whitmire, Dawn W, FNP  Lancets (FREESTYLE) lancets  06/12/19  Yes [provider]  levocetirizine (XYZAL) 5 MG tablet TAKE 1 TABLET BY MOUTH EVERY EVENING 08/08/19  Yes Bobbitt, [place] Muta, MD  LORazepam (ATIVAN) 2 MG tablet Take 2 mg by mouth 3 (three) times daily as needed. 06/03/19  Yes [provider]  propranolol (INDERAL) 40 MG tablet TAKE 1 TABLET (40 MG TOTAL) BY MOUTH 2 (TWO) TIMES DAILY. 09/05/19  Yes Jettie Booze, MD  valACYclovir (VALTREX) 500 MG tablet Take 500 mg by mouth daily. 10/09/19  Yes [provider]  zolpidem (AMBIEN) 5 MG tablet Take 5 mg by mouth at bedtime.    Yes [provider]  azelastine (ASTELIN) 0.1 % nasal spray Place 2 sprays into both nostrils 2 (two) times daily. Use in each nostril as directed 02/08/18   Bobbitt, [place] Muta, MD  Brexpiprazole (REXULTI) 2 MG TABS Take 2 mg by mouth daily. Patient taking differently: Take 1 mg by mouth daily.  07/10/18   Kathrynn Ducking, MD  budesonide-formoterol [facility]) 160-4.5 MCG/ACT inhaler 2 puffs twice a day with spacer 07/25/19   Bobbitt, [place] Muta, MD  Calcium Carbonate-Vitamin D (CALCIUM 600+D) 600-400 MG-UNIT tablet Take 1 tablet by mouth daily.    [provider]  carbidopa-levodopa (SINEMET CR) 50-200 MG tablet Take 1 tablet by mouth in the morning, at noon, in the evening, and at bedtime. 11/18/19   Kathrynn Ducking, MD  dexlansoprazole (DEXILANT) 60 MG capsule Take 1 capsule (60 mg total) by mouth daily. 12/17/19   Bobbitt, [place] Muta, MD  EPINEPHrine (EPIPEN 2-PAK) 0.3 mg/0.3 mL IJ SOAJ injection Inject 0.3 mg into the muscle as needed for anaphylaxis. 12/13/19   Bobbitt, [place] Muta, MD  famotidine (PEPCID) 20 MG tablet TAKE 1 TABLET (20 MG TOTAL) BY MOUTH 2 (TWO) TIMES DAILY. 11/26/19    Bobbitt, [place] Muta, MD  fluticasone (FLONASE) 50 MCG/ACT nasal spray Place 2 sprays into both nostrils daily. 09/24/19   Dara Hoyer, FNP  furosemide (LASIX) 20 MG tablet Take 20 mg by mouth  every other day.    [provider]  HYDROcodone-acetaminophen (NORCO) 5-325 MG tablet Take 1 tablet by mouth every 6 (six) hours as needed for up to 2 days for moderate pain. 01/17/20 01/19/20  Lannie Fields, PA-C  losartan-hydrochlorothiazide (HYZAAR) 100-12.5 MG tablet Take 1 tablet by mouth daily. 10/22/19   [provider]  mirabegron ER (MYRBETRIQ) 50 MG TB24 tablet Take 50 mg by mouth daily.    [provider]  montelukast (SINGULAIR) 10 MG tablet TAKE 1 TABLET (10 MG TOTAL) BY MOUTH AT BEDTIME. 08/08/19   Bobbitt, [place] Muta, MD  Olopatadine HCl (PATADAY) 0.2 % SOLN Place 1 drop into both eyes daily as needed. 06/13/19   Bobbitt, [place] Muta, MD  omalizumab [place] Right) 150 MG injection Inject 300 mg into the skin every 28 (twenty-eight) days. Dx J45.40- PA [id number] 12/09/19-12/04/20- copay card ID [id number] BIN [id number], Payer [id number] 12/10/19   Tresa Garter, MD  rOPINIRole (REQUIP) 2 MG tablet Take 2 mg by mouth 2 (two) times daily. 03/16/15   [provider]  solifenacin (VESICARE) 10 MG tablet Take 10 mg by mouth daily. 06/05/19   [provider]  vitamin C (ASCORBIC ACID) 500 MG tablet Take 500 mg by mouth daily.    [provider]  Vitamin D, Ergocalciferol, 2000 units CAPS Take 1 capsule by mouth daily. 2000 units daily.    [provider]  vortioxetine HBr (TRINTELLIX) 20 MG TABS Take 20 mg by mouth daily.     [provider]    Allergies Fluticasone-salmeterol, Codeine, Fetzima [levomilnacipran], Nsaids, Trulicity [dulaglutide], Xanax [alprazolam], Amoxicillin, and Pseudoephedrine hcl er  Family History  Problem Relation Age of Onset  . Alcohol abuse Brother   . Alcohol abuse Brother   . Alcohol abuse Brother   .  Alcohol abuse Brother   . High blood pressure Mother   . Hyperlipidemia Mother   . Heart disease Mother   . Heart disease Father   . Alcoholism Father   . Allergic rhinitis Sister   . Diabetes Neg Hx   . Angioedema Neg Hx   . Asthma Neg Hx   . Eczema Neg Hx   . Immunodeficiency Neg Hx   . Urticaria Neg Hx   . Breast cancer Neg Hx     Social History Social History   Tobacco Use  . Smoking status: Never Smoker  . Smokeless tobacco: Never Used  Vaping Use  . Vaping Use: Never used  Substance Use Topics  . Alcohol use: No    Alcohol/week: 0.0 standard drinks  . Drug use: No     Review of Systems  Constitutional: No fever/chills Eyes:  No discharge ENT: No upper respiratory complaints. Respiratory: no cough. No SOB/ use of accessory muscles to breath Gastrointestinal:   No nausea, no vomiting.  No diarrhea.  No constipation. Musculoskeletal: Patient has left shoulder pain and anterior chest wall pain.  Skin: Negative for rash, abrasions, lacerations, ecchymosis.    ____________________________________________   PHYSICAL EXAM:  VITAL SIGNS: ED Triage Vitals [01/17/20 1626]  Enc Vitals Group     BP 130/74     Pulse Rate 66     Resp 18     Temp 98 F (36.7 C)     Temp Source Oral     SpO2 100 %     Weight      Height      Head Circumference      Peak Flow  Pain Score 5     Pain Loc      Pain Edu?      Excl. in [place]?      Constitutional: Alert and oriented. Well appearing and in no acute distress. Eyes: Conjunctivae are normal. PERRL. EOMI. Head: Atraumatic. ENT:      Nose: No congestion/rhinnorhea.      Mouth/Throat: Mucous membranes are moist.  Neck: No stridor.  FROM.  Cardiovascular: Normal rate, regular rhythm. Normal S1 and S2.  Good peripheral circulation. Respiratory: Normal respiratory effort without tachypnea or retractions. Lungs CTAB. Good air entry to the bases with no decreased or absent breath sounds Gastrointestinal: Bowel sounds x  4 quadrants. Soft and nontender to palpation. No guarding or rigidity. No distention. Musculoskeletal: Patient has 5 out of 5 strength in the upper extremities.  Full range of motion to all extremities. No obvious deformities noted Neurologic:  Normal for age. No gross focal neurologic deficits are appreciated.  Skin:  Skin is warm, dry and intact. No rash noted. Psychiatric: Mood and affect are normal for age. Speech and behavior are normal.   ____________________________________________   LABS (all labs ordered are listed, but only abnormal results are displayed)  Labs Reviewed - No data to display ____________________________________________  EKG   ____________________________________________  RADIOLOGY Unk Pinto, personally viewed and evaluated these images (plain radiographs) as part of my medical decision making, as well as reviewing the written report by the radiologist.    DG Chest 2 View  Result Date: 01/17/2020 CLINICAL DATA:  [place] 3 days ago.  Chest wall pain. EXAM: CHEST - 2 VIEW COMPARISON:  07/02/2019 FINDINGS: Cardiac silhouette is normal in size. No mediastinal or hilar masses or evidence of adenopathy. Lungs are clear.  No pleural effusion or pneumothorax. Skeletal structures are intact. IMPRESSION: No active cardiopulmonary disease. Electronically Signed   By: Lajean Manes M.D.   On: 01/17/2020 18:48   DG Cervical Spine Complete  Result Date: 01/17/2020 CLINICAL DATA:  [place] 3 days ago.  Neck pain. EXAM: CERVICAL SPINE - COMPLETE 4+ VIEW COMPARISON:  10/31/2014 FINDINGS: No fracture, bone lesion or spondylolisthesis. Mild to moderate loss of disc height from C3-C4 through C6-C7 associated with endplate spurring. Soft tissues are unremarkable. IMPRESSION: No fracture or acute finding. Electronically Signed   By: Lajean Manes M.D.   On: 01/17/2020 18:47    ____________________________________________    PROCEDURES  Procedure(s) performed:      Procedures     Medications  HYDROcodone-acetaminophen (NORCO/VICODIN) 5-325 MG per tablet 1 tablet (1 tablet Oral Given 01/17/20 1850)     ____________________________________________   INITIAL IMPRESSION / ASSESSMENT AND PLAN / ED COURSE  Pertinent labs & imaging results that were available during my care of the patient were reviewed by me and considered in my medical decision making (see chart for details).      Assessment and plan Fall 64 year old female presents to the urgent care after running into a fence.  Patient was primarily complaining of anterior chest wall pain neck pain.  There was no bony abnormality on x-rays of the cervical spine.  No evidence of rib fracture or pneumothorax on chest x-ray.  Patient was given Norco in the urgent care for pain.  She was discharged with a short course of Norco.  Return precautions were given to return with new or worsening symptoms.    ____________________________________________  FINAL CLINICAL IMPRESSION(S) / ED DIAGNOSES  Final diagnoses:  Fall, initial encounter  NEW MEDICATIONS STARTED DURING THIS VISIT:  ED Discharge Orders         Ordered    HYDROcodone-acetaminophen ([place]) 5-325 MG tablet  Every 6 hours PRN        01/17/20 1904              This chart was dictated using voice recognition software/Dragon. Despite best efforts to proofread, errors can occur which can change the meaning. Any change was purely unintentional.     Lannie Fields, PA-C 01/17/20 1913

## 2020-01-17 NOTE — Discharge Instructions (Addendum)
You have been prescribed norco for pain.

## 2020-01-17 NOTE — ED Triage Notes (Signed)
Pt states she fell while walking and twisted her arm around a rail and hit her head on the rail. Pt complains of upeer back and neck pain, chest pain that worsens with deep breath and movement, and arm pain. Pt cannot raise her arms freely and states the pain is severe. Pt states she has no head pain. Pt is oax4 and ambulatory.

## 2020-01-20 ENCOUNTER — Other Ambulatory Visit (HOSPITAL_BASED_OUTPATIENT_CLINIC_OR_DEPARTMENT_OTHER): Payer: Self-pay | Admitting: Obstetrics and Gynecology

## 2020-01-20 ENCOUNTER — Encounter (INDEPENDENT_AMBULATORY_CARE_PROVIDER_SITE_OTHER): Payer: Self-pay | Admitting: Family Medicine

## 2020-01-20 MED FILL — VALACYCLOVIR HCL 500 MG TAB: 500 | 90 days supply | Qty: 90 | Fill #0

## 2020-01-20 NOTE — Progress Notes (Signed)
Chief Complaint:   OBESITY Deanna Rowe is here to discuss her progress with her obesity treatment plan along with follow-up of her obesity related diagnoses. Deanna Rowe is on the Category 3 Plan and states she is following her eating plan approximately 50% of the time. Deanna Rowe states she is walking and exercising on the treadmill 30-60 minutes 3 times per week.  Today's visit was #: 52 Starting weight: 237 lbs Starting date: 04/16/2019 Today's weight: 237 lbs Today's date: 01/16/2020 Total lbs lost to date: 0 Total lbs lost since last in-office visit: 1  Interim History: Deanna Rowe notes she is eating all of her meals the last few weeks. She eats out once weekly (Usually fried seafood), which is down from 3 times per week. She deviates from plan at supper, having some fried foods and a carb. CBG's have been high at bedtime.  Subjective:   Type 2 diabetes mellitus with other specified complication, with long-term current use of insulin (Deanna Rowe). Diabetes is not well controlled. Last A1c was 7.6 in July 2021. Fasting blood glucose levels are reported to be in the range of 130-150. CBG's at bedtime are 195-202. No hypoglycemia. She sees Dr. Buddy Duty next week.  Lab Results  Component Value Date   HGBA1C 7.9 (H) 04/16/2019   HGBA1C 6.9 11/25/2016   HGBA1C 6.0 06/10/2016   Lab Results  Component Value Date   MICROALBUR <0.7 02/12/2016   LDLCALC 57 04/16/2019   CREATININE 1.10 (H) 04/16/2019   Lab Results  Component Value Date   INSULIN 12.3 04/16/2019   Assessment/Plan:   Type 2 diabetes mellitus with other specified complication, with long-term current use of insulin (Deanna Rowe). Deanna Rowe will follow-up with Dr. Buddy Duty next week as scheduled.  Class 2 severe obesity with serious comorbidity and body mass index (BMI) of 38.0 to 38.9 in adult, unspecified obesity type (Deanna Rowe).  Deanna Rowe is currently in the action stage of change. As such, her goal is to continue with weight loss  efforts. She has agreed to the Category 3 Plan. She will reduce carbs at dinner.  Exercise goals: Deanna Rowe will continue her current exercise regimen.   Behavioral modification strategies: decreasing simple carbohydrates.  Deanna Rowe has agreed to follow-up with our clinic in 3 weeks. She was informed of the importance of frequent follow-up visits to maximize her success with intensive lifestyle modifications for her multiple health conditions.   Objective:   Blood pressure 107/67, pulse 66, temperature 97.7 F (36.5 C), height 5\' 6"  (1.676 m), weight 237 lb (107.5 kg), SpO2 98 %. Body mass index is 38.25 kg/m.  General: Cooperative, alert, well developed, in no acute distress. HEENT: Conjunctivae and lids unremarkable. Cardiovascular: Regular rhythm.  Lungs: Normal work of breathing. Neurologic: No focal deficits.   Lab Results  Component Value Date   CREATININE 1.10 (H) 04/16/2019   BUN 11 04/16/2019   NA 142 04/16/2019   K 4.5 04/16/2019   CL 103 04/16/2019   CO2 27 04/16/2019   Lab Results  Component Value Date   ALT 21 04/16/2019   AST 23 04/16/2019   ALKPHOS 58 04/16/2019   BILITOT 0.3 04/16/2019   Lab Results  Component Value Date   HGBA1C 7.9 (H) 04/16/2019   HGBA1C 6.9 11/25/2016   HGBA1C 6.0 06/10/2016   HGBA1C 5.9 02/12/2016   HGBA1C 6.1 12/01/2015   Lab Results  Component Value Date   INSULIN 12.3 04/16/2019   Lab Results  Component Value Date   TSH 1.780 04/16/2019  Lab Results  Component Value Date   CHOL 155 04/16/2019   HDL 84 04/16/2019   LDLCALC 57 04/16/2019   TRIG 71 04/16/2019   Lab Results  Component Value Date   WBC 6.8 04/16/2019   HGB 12.3 04/16/2019   HCT 36.1 04/16/2019   MCV 96 04/16/2019   PLT 274 04/16/2019   No results found for: IRON, TIBC, FERRITIN  Attestation Statements:   Reviewed by clinician on day of visit: allergies, medications, problem list, medical history, surgical history, family history, social history,  and previous encounter notes.  IMichaelene Song, am acting as Location manager for [employer], FNP-C   I have reviewed the above documentation for accuracy and completeness, and I agree with the above. -  Deanna Fick, FNP

## 2020-01-24 ENCOUNTER — Other Ambulatory Visit (HOSPITAL_BASED_OUTPATIENT_CLINIC_OR_DEPARTMENT_OTHER): Payer: Self-pay | Admitting: Internal Medicine

## 2020-01-24 MED FILL — FLUARIX QUADRIVALENT 0.5 ML: 0.5 | 1 days supply | Qty: 1 | Fill #0

## 2020-01-27 ENCOUNTER — Other Ambulatory Visit: Payer: Self-pay | Admitting: Allergy and Immunology

## 2020-01-27 MED FILL — LOSARTAN-HCTZ 100-12.5 MG T: 100-12.5 | 90 days supply | Qty: 90 | Fill #0

## 2020-01-27 MED FILL — MONTELUKAST SOD 10 MG TAB: 10 | 30 days supply | Qty: 30 | Fill #5

## 2020-01-28 NOTE — Progress Notes (Signed)
Cardiology Office Note   Date:  01/29/2020   ID:  Savon, Cobbs [birth year], MRN [id number]  PCP:  Leighton Ruff, MD    No chief complaint on file.    Wt Readings from Last 3 Encounters:  01/29/20 238 lb 12.8 oz (108.3 kg)  01/16/20 237 lb (107.5 kg)  12/17/19 238 lb (108 kg)       History of Present Illness: Deanna Rowe is a 64 y.o. female  With h/o palpitations.  PVCs noted on prior monitors.   Prior cardiac testing in 2017 as follows: "Her ETT was negative for ischemia. No arrthymias noted during stress. Heart monitor was also fairly normal showing mostly NSR with occasinal PVCs and PACs. "  She has reported DOE over the past several months.  THere is some chest tightness, worse with walking.   Walking short distances causes sx.  Has issues with balance, related to Parkinson's.  She has had three falls.  ER visit showed that no fracture.      Past Medical History:  Diagnosis Date  . Anemia   . Anxiety   . Arthritis    L HIP  . Asthma   . Avascular necrosis of hip (HCC)    LEFT  . Back pain   . Chest pain   . Constipation   . Depression   . Diabetes mellitus   . Diabetes mellitus, type II ([place])   . Dry cough   . Edema, lower extremity   . Gait abnormality 11/18/2019  . GERD (gastroesophageal reflux disease)   . High cholesterol   . Hypertension   . Insomnia    takes Ambien nightly  . Joint pain   . Kidney disease   . Kidney disease   . Obesity   . Parkinsonism ([place]) 03/02/2017  . PONV (postoperative nausea and vomiting)   . RLS (restless legs syndrome) 01/15/2018  . Sleep apnea   . Swallowing difficulty   . Thyroid disease   . TIA (transient ischemic attack) 2012   no residual problems  . Urgency incontinence   . Vitamin D deficiency     Past Surgical History:  Procedure Laterality Date  . DILATION AND CURETTAGE OF UTERUS    . ESOPHAGEAL MANOMETRY N/A 09/03/2012   Procedure: ESOPHAGEAL MANOMETRY (EM);   Surgeon: Garlan Fair, MD;  Location: [facility];  Service: Endoscopy;  Laterality: N/A;  . ESOPHAGEAL MANOMETRY N/A 06/19/2017   Procedure: ESOPHAGEAL MANOMETRY (EM);  Surgeon: Clarene Essex, MD;  Location: [facility];  Service: Endoscopy;  Laterality: N/A;  . EXPLORATORY LAPAROTOMY    . GANGLION CYST EXCISION    . JOINT REPLACEMENT  2011   rt total hip  . KNEE ARTHROSCOPY    . PILONIDAL CYST / SINUS EXCISION    . TOTAL HIP ARTHROPLASTY Left 10/09/2013   Procedure: LEFT TOTAL HIP ARTHROPLASTY ANTERIOR APPROACH;  Surgeon: [name], MD;  Location: [facility];  Service: Orthopedics;  Laterality: Left;     Current Outpatient Medications  Medication Sig Dispense Refill  . ACCU-CHEK FASTCLIX LANCETS MISC USE TO CHECK BLOOD SUGAR 3 TIMES A DAY 102 each 0  . ACCU-CHEK GUIDE test strip USE TO CHECK BLOOD SUGAR 3 TIMES PER DAY. (Patient taking differently: Use to check blood sugar 4 times per day.) 100 each 2  . albuterol (PROVENTIL HFA;VENTOLIN HFA) 108 (90 Base) MCG/ACT inhaler Inhale 2 puffs into the lungs as needed for wheezing or shortness of breath. 18 g 2  .  amLODipine (NORVASC) 5 MG tablet Take 5 mg by mouth every morning.     Marland Kitchen aspirin EC 81 MG tablet Take 81 mg by mouth every evening.    Marland Kitchen atorvastatin (LIPITOR) 10 MG tablet Take 10 mg by mouth at bedtime.  4  . azelastine (ASTELIN) 0.1 % nasal spray Place 2 sprays into both nostrils 2 (two) times daily. Use in each nostril as directed 30 mL 5  . Brexpiprazole (REXULTI) 2 MG TABS Take 2 mg by mouth daily. (Patient taking differently: Take 1 mg by mouth daily. ) 30 tablet   . budesonide-formoterol (SYMBICORT) 160-4.5 MCG/ACT inhaler 2 puffs twice a day with spacer 1 Inhaler 5  . buPROPion (WELLBUTRIN XL) 150 MG 24 hr tablet Take 450 mg by mouth every morning.     . Calcium Carbonate-Vitamin D (CALCIUM 600+D) 600-400 MG-UNIT tablet Take 1 tablet by mouth daily.    . carbidopa-levodopa (SINEMET CR) 50-200 MG tablet Take 1 tablet by  mouth in the morning, at noon, in the evening, and at bedtime. 360 tablet 1  . dexlansoprazole (DEXILANT) 60 MG capsule Take 1 capsule (60 mg total) by mouth daily. 30 capsule 5  . EPINEPHrine (EPIPEN 2-PAK) 0.3 mg/0.3 mL IJ SOAJ injection Inject 0.3 mg into the muscle as needed for anaphylaxis. 2 each 1  . famotidine (PEPCID) 20 MG tablet TAKE 1 TABLET (20 MG TOTAL) BY MOUTH 2 (TWO) TIMES DAILY. 60 tablet 2  . fluticasone (FLONASE) 50 MCG/ACT nasal spray Place 2 sprays into both nostrils daily. 1 g 5  . furosemide (LASIX) 20 MG tablet Take 20 mg by mouth every other day.    . gabapentin (NEURONTIN) 300 MG capsule Take 300 mg by mouth 2 (two) times daily.     . hydrochlorothiazide (HYDRODIURIL) 12.5 MG tablet Take 12.5 mg by mouth daily.     . Insulin Glargine (BASAGLAR KWIKPEN) 100 UNIT/ML Inject 0.4 mLs (40 Units total) into the skin daily. 5 pen 0  . insulin lispro (HUMALOG) 100 UNIT/ML injection SQ - 14 units at breakfast, 12 units at lunch, 16 units at supper 10 mL 0  . Lancets (FREESTYLE) lancets     . levocetirizine (XYZAL) 5 MG tablet TAKE 1 TABLET BY MOUTH EVERY EVENING 30 tablet 5  . LORazepam (ATIVAN) 2 MG tablet Take 2 mg by mouth 3 (three) times daily as needed.    Marland Kitchen losartan-hydrochlorothiazide (HYZAAR) 100-12.5 MG tablet Take 1 tablet by mouth daily.    . mirabegron ER (MYRBETRIQ) 50 MG TB24 tablet Take 50 mg by mouth daily.    . montelukast (SINGULAIR) 10 MG tablet TAKE 1 TABLET (10 MG TOTAL) BY MOUTH AT BEDTIME. 30 tablet 5  . Olopatadine HCl (PATADAY) 0.2 % SOLN Place 1 drop into both eyes daily as needed. 2.5 mL 5  . omalizumab (XOLAIR) 150 MG injection Inject 300 mg into the skin every 28 (twenty-eight) days. Dx J45.40- [place] [id number] 12/09/19-12/04/20- copay card ID [id number] BIN [id number], Payer [phone] 2 each 11  . propranolol (INDERAL) 40 MG tablet TAKE 1 TABLET (40 MG TOTAL) BY MOUTH 2 (TWO) TIMES DAILY. 180 tablet 2  . rOPINIRole (REQUIP) 2 MG tablet Take 2 mg by mouth 2 (two)  times daily.  3  . solifenacin (VESICARE) 10 MG tablet Take 10 mg by mouth daily.    . valACYclovir (VALTREX) 500 MG tablet Take 500 mg by mouth daily.    . vitamin C (ASCORBIC ACID) 500 MG tablet Take 500 mg by mouth daily.    Marland Kitchen  Vitamin D, Ergocalciferol, 2000 units CAPS Take 1 capsule by mouth daily. 2000 units daily.    Marland Kitchen vortioxetine HBr (TRINTELLIX) 20 MG TABS Take 20 mg by mouth daily.     Marland Kitchen zolpidem (AMBIEN) 5 MG tablet Take 5 mg by mouth at bedtime.      Current Facility-Administered Medications  Medication Dose Route Frequency Provider Last Rate Last Admin  . omalizumab Arvid Right) injection 300 mg  300 mg Subcutaneous Q28 days Bobbitt, [place] Muta, MD   300 mg at 01/14/20 1014   Facility-Administered Medications Ordered in Other Visits  Medication Dose Route Frequency Provider Last Rate Last Admin  . tranexamic acid (CYKLOKAPRON) topical -INTRAOP  2,000 mg Topical Once Cecilio Asper, [employer], PA-C        Allergies:   Fluticasone-salmeterol, Codeine, Fetzima [levomilnacipran], Nsaids, Trulicity [dulaglutide], Xanax [alprazolam], Amoxicillin, and Pseudoephedrine hcl er    Social History:  The patient  reports that she has never smoked. She has never used smokeless tobacco. She reports that she does not drink alcohol and does not use drugs.   Family History:  The patient's family history includes Alcohol abuse in her brother, brother, brother, and brother; Alcoholism in her father; Allergic rhinitis in her sister; Heart disease in her father and mother; High blood pressure in her mother; Hyperlipidemia in her mother.    ROS:  Please see the history of present illness.   Otherwise, review of systems are positive for DOE, chest tightness.   All other systems are reviewed and negative.    PHYSICAL EXAM: VS:  BP 116/66   Pulse 68   Ht 5\' 6"  (1.676 m)   Wt 238 lb 12.8 oz (108.3 kg)   SpO2 93%   BMI 38.54 kg/m  , BMI Body mass index is 38.54 kg/m. GEN: Well nourished, well developed, in  no acute distress  HEENT: normal  Neck: no JVD, carotid bruits, or masses Cardiac: RRR; no murmurs, rubs, or gallops,no edema  Respiratory:  clear to auscultation bilaterally, normal work of breathing GI: soft, nontender, nondistended, + BS MS: no deformity or atrophy  Skin: warm and dry, no rash Neuro:  Strength and sensation are intact Psych: euthymic mood, full affect   EKG:   The ekg ordered 1/21 demonstrates NSR, no ST changes   Recent Labs: 04/16/2019: ALT 21; BUN 11; Creatinine, Ser 1.10; Hemoglobin 12.3; Platelets 274; Potassium 4.5; Sodium 142; TSH 1.780   Lipid Panel    Component Value Date/Time   CHOL 155 04/16/2019 1451   TRIG 71 04/16/2019 1451   HDL 84 04/16/2019 1451   LDLCALC 57 04/16/2019 1451     Other studies Reviewed: Additional studies/ records that were reviewed today with results demonstrating: labs reviewed.   ASSESSMENT AND PLAN:  1. PACs/PVCs: No sx.   2. Chest tightness: SOme typical features.  Will plan for CTA coronaries to eval for CAD.  Take propranolol to 80 mg before CT scan to help with HR. 3. DOE: COuld be an anginal equivalent.  May be some deconditioning.   4. Avoid falls:  Parkinson's disease being managed with neuro   Current medicines are reviewed at length with the patient today.  The patient concerns regarding her medicines were addressed.  The following changes have been made:  No change  Labs/ tests ordered today include:  No orders of the defined types were placed in this encounter.   Recommend 150 minutes/week of aerobic exercise Low fat, low carb, high fiber diet recommended  Disposition:   FU  based on CT result, other than 1 year   Signed, Larae Grooms, MD  01/29/2020 10:25 AM    [facility] [place], [place], [place]  [address] Phone: [phone]; Fax: [phone]

## 2020-01-29 ENCOUNTER — Other Ambulatory Visit: Payer: Self-pay

## 2020-01-29 ENCOUNTER — Encounter: Payer: Self-pay | Admitting: Interventional Cardiology

## 2020-01-29 ENCOUNTER — Ambulatory Visit (INDEPENDENT_AMBULATORY_CARE_PROVIDER_SITE_OTHER): Payer: 59 | Admitting: Interventional Cardiology

## 2020-01-29 VITALS — BP 116/66 | HR 68 | Ht 66.0 in | Wt 238.8 lb

## 2020-01-29 DIAGNOSIS — E782 Mixed hyperlipidemia: Secondary | ICD-10-CM

## 2020-01-29 DIAGNOSIS — I493 Ventricular premature depolarization: Secondary | ICD-10-CM

## 2020-01-29 DIAGNOSIS — I5032 Chronic diastolic (congestive) heart failure: Secondary | ICD-10-CM

## 2020-01-29 DIAGNOSIS — I10 Essential (primary) hypertension: Secondary | ICD-10-CM

## 2020-01-29 DIAGNOSIS — R072 Precordial pain: Secondary | ICD-10-CM

## 2020-01-29 NOTE — Patient Instructions (Signed)
Medication Instructions:  Your physician recommends that you continue on your current medications as directed. Please refer to the Current Medication list given to you today.  *If you need a refill on your cardiac medications before your next appointment, please call your pharmacy*   Lab Work: None  If you have labs (blood work) drawn today and your tests are completely normal, you will receive your results only by: Marland Kitchen MyChart Message (if you have MyChart) OR . A paper copy in the mail If you have any lab test that is abnormal or we need to change your treatment, we will call you to review the results.   Testing/Procedures: Your physician has requested that you have cardiac CT.    Follow-Up: At [facility], you and your health needs are our priority.  As part of our continuing mission to provide you with exceptional heart care, we have created designated Provider Care Teams.  These Care Teams include your primary Cardiologist (physician) and Advanced Practice Providers (APPs -  Physician Assistants and Nurse Practitioners) who all work together to provide you with the care you need, when you need it.  We recommend signing up for the patient portal called "MyChart".  Sign up information is provided on this After Visit Summary.  MyChart is used to connect with patients for Virtual Visits (Telemedicine).  Patients are able to view lab/test results, encounter notes, upcoming appointments, etc.  Non-urgent messages can be sent to your provider as well.   To learn more about what you can do with MyChart, go to [email or web address].    Your next appointment:   1 year(s)  The format for your next appointment:   In Person  Provider:   You may see Larae Grooms, MD or one of the following Advanced Practice Providers on your designated Care Team:    Melina Copa, PA-C  Ermalinda Barrios, PA-C    Other Instructions Your cardiac CT will be scheduled at one of the below locations:    [facility] [address] [place], [place] [address] [phone]  [place] [address] [place], [place] [address] [phone]  If scheduled at [facility], please arrive at the [facility] main entrance of [facility] 30 minutes prior to test start time. Proceed to the [facility] Radiology Department (first floor) to check-in and test prep.  If scheduled at [facility], please arrive 15 mins early for check-in and test prep.  Please follow these instructions carefully (unless otherwise directed):   On the Night Before the Test: . Be sure to Drink plenty of water. . Do not consume any caffeinated/decaffeinated beverages or chocolate 12 hours prior to your test. . Do not take any antihistamines 12 hours prior to your test.   On the Day of the Test: . Drink plenty of water. Do not drink any water within one hour of the test. . Do not eat any food 4 hours prior to the test. . You may take your regular medications prior to the test.  . Take an extra 40 mg of propranolol (for a total of 80 mg) two hours prior to test. . HOLD losartan-Hydrochlorothiazide the morning of the test. . DO NOT use any insulin the morning of your test . FEMALES- please wear underwire-free bra if available     After the Test: . Drink plenty of water. . After receiving IV contrast, you may experience a mild flushed feeling.  This is normal. . On occasion, you may experience a mild rash up to 24 hours after the test. This is not dangerous. If this occurs, you can take Benadryl 25 mg and increase your fluid intake. . If you experience trouble breathing, this can be serious. If it is severe call 911 IMMEDIATELY. If it is mild, please call our office.  Once we have confirmed authorization from your insurance company, we will call you to set up a date and time for your test. Based on how quickly your  insurance processes prior authorizations requests, please allow up to 4 weeks to be contacted for scheduling your Cardiac CT appointment. Be advised that routine Cardiac CT appointments could be scheduled as many as 8 weeks after your provider has ordered it.  For non-scheduling related questions, please contact the cardiac imaging nurse navigator should you have any questions/concerns: Marchia Bond, Cardiac Imaging Nurse Navigator Burley Saver, Interim Cardiac Imaging Nurse [facility] Direct Office Dial: [phone]   For scheduling needs, including cancellations and rescheduling, please call Vivien Rota at [phone], option 3.

## 2020-02-03 ENCOUNTER — Other Ambulatory Visit: Payer: Self-pay | Admitting: Allergy and Immunology

## 2020-02-03 MED FILL — MYRBETRIQ ER 50 MG TABLET: 50 | 30 days supply | Qty: 30 | Fill #6

## 2020-02-06 ENCOUNTER — Ambulatory Visit (INDEPENDENT_AMBULATORY_CARE_PROVIDER_SITE_OTHER): Payer: 59 | Admitting: Family Medicine

## 2020-02-07 MED FILL — LEVOCETIRIZINE 5 MG TABLET: 5 | 30 days supply | Qty: 30 | Fill #0

## 2020-02-10 ENCOUNTER — Ambulatory Visit: Payer: 59 | Attending: Internal Medicine

## 2020-02-10 ENCOUNTER — Other Ambulatory Visit (HOSPITAL_BASED_OUTPATIENT_CLINIC_OR_DEPARTMENT_OTHER): Payer: Self-pay | Admitting: Internal Medicine

## 2020-02-10 DIAGNOSIS — Z23 Encounter for immunization: Secondary | ICD-10-CM

## 2020-02-11 ENCOUNTER — Ambulatory Visit: Payer: Self-pay

## 2020-02-11 ENCOUNTER — Ambulatory Visit (INDEPENDENT_AMBULATORY_CARE_PROVIDER_SITE_OTHER): Payer: 59 | Admitting: Family Medicine

## 2020-02-11 ENCOUNTER — Encounter (INDEPENDENT_AMBULATORY_CARE_PROVIDER_SITE_OTHER): Payer: Self-pay | Admitting: Family Medicine

## 2020-02-11 ENCOUNTER — Other Ambulatory Visit: Payer: Self-pay

## 2020-02-11 VITALS — BP 113/66 | HR 64 | Temp 97.6°F | Ht 66.0 in | Wt 237.0 lb

## 2020-02-11 DIAGNOSIS — Z794 Long term (current) use of insulin: Secondary | ICD-10-CM | POA: Diagnosis not present

## 2020-02-11 DIAGNOSIS — Z6838 Body mass index (BMI) 38.0-38.9, adult: Secondary | ICD-10-CM | POA: Diagnosis not present

## 2020-02-11 DIAGNOSIS — E1142 Type 2 diabetes mellitus with diabetic polyneuropathy: Secondary | ICD-10-CM | POA: Diagnosis not present

## 2020-02-12 ENCOUNTER — Other Ambulatory Visit (HOSPITAL_BASED_OUTPATIENT_CLINIC_OR_DEPARTMENT_OTHER): Payer: Self-pay | Admitting: Nephrology

## 2020-02-12 ENCOUNTER — Encounter (INDEPENDENT_AMBULATORY_CARE_PROVIDER_SITE_OTHER): Payer: Self-pay | Admitting: Family Medicine

## 2020-02-12 MED FILL — BASAGLAR 100 UNIT/ML KWIKPE: 100 | 90 days supply | Qty: 45 | Fill #1

## 2020-02-12 MED FILL — HUMALOG 100 UNITS/ML KWIKPE: 100 | 89 days supply | Qty: 45 | Fill #2

## 2020-02-12 MED FILL — FUROSEMIDE 20 MG TAB: 20 | 90 days supply | Qty: 45 | Fill #0

## 2020-02-12 NOTE — Progress Notes (Signed)
Chief Complaint:   OBESITY Deanna Rowe is here to discuss her progress with her obesity treatment plan along with follow-up of her obesity related diagnoses. Deanna Rowe is on the Category 3 Plan and states she is following her eating plan approximately 50% of the time. Deanna Rowe states she is doing 0 minutes 0 times per week.  Today's visit was #: 33 Starting weight: 237 lbs Starting date: 04/16/2019 Today's weight: 237 lbs Today's date: 02/11/2020 Total lbs lost to date: 0 Total lbs lost since last in-office visit: 1  Interim History: Deanna Rowe's husband is with her at this office visit. He would like to discuss adding weight loss medication. She snacks too much on carbohydrates, which are kept at the house. She admits hunger is an issue. They do eat out 3 times per week (fried fish or steak with carbs and bread). They sometimes have meals consisting of vegetables only. I have considered GLP-1 for her in past. She has told me that she lost a great deal of weight with Trulicity due to lack of appetite.   Subjective:   1. Type 2 diabetes mellitus with other specified complication, with long-term current use of insulin (HCC) Deanna Rowe's A1c is not at goal at 7.6. She has an upcoming appointment with Dr. Buddy Duty. She admits to eating too many carbohydrates throughout the day and at supper. She is currently on Basal insulin and meal coverage.  Lab Results  Component Value Date   HGBA1C 7.9 (H) 04/16/2019   HGBA1C 6.9 11/25/2016   HGBA1C 6.0 06/10/2016   Lab Results  Component Value Date   MICROALBUR <0.7 02/12/2016   LDLCALC 57 04/16/2019   CREATININE 1.10 (H) 04/16/2019   Lab Results  Component Value Date   INSULIN 12.3 04/16/2019   Assessment/Plan:   1. Type 2 diabetes mellitus with other specified complication, with long-term current use of insulin (HCC) Samreet will follow up with Dr. Buddy Duty and will discuss possibility of starting [facility]. I also sent message to Dr. Buddy Duty to see what his  thoughts are about starting [facility].   2. Class 2 severe obesity with serious comorbidity and body mass index (BMI) of 38.0 to 38.9 in adult, unspecified obesity type (HCC) Deanna Rowe is currently in the action stage of change. As such, her goal is to continue with weight loss efforts. She has agreed to the Category 3 Plan.   Handouts were given today: Protein Exchanges, and Thanksgiving Tips.  Exercise goals: She will work on increasing her activity overall. She is retired and is not very active throughout the day.   Behavioral modification strategies: decreasing simple carbohydrates, decreasing eating out, meal planning and cooking strategies and better snacking choices. We discussed importance of keeping simple carbohydrate snacks out of the home, choosing more appropriate foods when dining out. I encouraged them to decrease dining out overall. I also advised that she needs to have protein at all meals.  Deanna Rowe has agreed to follow-up with our clinic in 3 weeks.   Objective:   Blood pressure 113/66, pulse 64, temperature 97.6 F (36.4 C), height 5\' 6"  (1.676 m), weight 237 lb (107.5 kg), SpO2 92 %. Body mass index is 38.25 kg/m.  General: Cooperative, alert, well developed, in no acute distress. HEENT: Conjunctivae and lids unremarkable. Cardiovascular: Regular rhythm.  Lungs: Normal work of breathing. Neurologic: No focal deficits.   Lab Results  Component Value Date   CREATININE 1.10 (H) 04/16/2019   BUN 11 04/16/2019   NA 142 04/16/2019   K  4.5 04/16/2019   CL 103 04/16/2019   CO2 27 04/16/2019   Lab Results  Component Value Date   ALT 21 04/16/2019   AST 23 04/16/2019   ALKPHOS 58 04/16/2019   BILITOT 0.3 04/16/2019   Lab Results  Component Value Date   HGBA1C 7.9 (H) 04/16/2019   HGBA1C 6.9 11/25/2016   HGBA1C 6.0 06/10/2016   HGBA1C 5.9 02/12/2016   HGBA1C 6.1 12/01/2015   Lab Results  Component Value Date   INSULIN 12.3 04/16/2019   Lab Results   Component Value Date   TSH 1.780 04/16/2019   Lab Results  Component Value Date   CHOL 155 04/16/2019   HDL 84 04/16/2019   LDLCALC 57 04/16/2019   TRIG 71 04/16/2019   Lab Results  Component Value Date   WBC 6.8 04/16/2019   HGB 12.3 04/16/2019   HCT 36.1 04/16/2019   MCV 96 04/16/2019   PLT 274 04/16/2019   No results found for: IRON, TIBC, FERRITIN  Attestation Statements:   Reviewed by clinician on day of visit: allergies, medications, problem list, medical history, surgical history, family history, social history, and previous encounter notes.   Wilhemena Durie, am acting as Location manager for [employer], FNP-C.  I have reviewed the above documentation for accuracy and completeness, and I agree with the above. -  Georgianne Fick, FNP

## 2020-02-13 MED FILL — XOLAIR 150 MG SOLR: 150 | 28 days supply | Qty: 2 | Fill #2

## 2020-02-14 DIAGNOSIS — H5213 Myopia, bilateral: Secondary | ICD-10-CM | POA: Diagnosis not present

## 2020-02-14 MED FILL — JANSSEN COVID-19 VACCINE 0.: 0.5 | 1 days supply | Qty: 1 | Fill #0

## 2020-02-17 ENCOUNTER — Other Ambulatory Visit (HOSPITAL_BASED_OUTPATIENT_CLINIC_OR_DEPARTMENT_OTHER): Payer: Self-pay | Admitting: Internal Medicine

## 2020-02-17 DIAGNOSIS — I700 Atherosclerosis of aorta: Secondary | ICD-10-CM | POA: Diagnosis not present

## 2020-02-17 DIAGNOSIS — N1832 Chronic kidney disease, stage 3b: Secondary | ICD-10-CM | POA: Diagnosis not present

## 2020-02-17 DIAGNOSIS — E1165 Type 2 diabetes mellitus with hyperglycemia: Secondary | ICD-10-CM | POA: Diagnosis not present

## 2020-02-17 DIAGNOSIS — E042 Nontoxic multinodular goiter: Secondary | ICD-10-CM | POA: Diagnosis not present

## 2020-02-17 MED FILL — OZEMPIC 0.25 OR 0.5 MG/DOSE: 2 | 44 days supply | Qty: 2 | Fill #0

## 2020-02-18 ENCOUNTER — Ambulatory Visit (INDEPENDENT_AMBULATORY_CARE_PROVIDER_SITE_OTHER): Payer: 59

## 2020-02-18 ENCOUNTER — Ambulatory Visit: Payer: 59

## 2020-02-18 ENCOUNTER — Other Ambulatory Visit (HOSPITAL_BASED_OUTPATIENT_CLINIC_OR_DEPARTMENT_OTHER): Payer: Self-pay | Admitting: Family Medicine

## 2020-02-18 DIAGNOSIS — J4540 Moderate persistent asthma, uncomplicated: Secondary | ICD-10-CM

## 2020-02-18 MED FILL — CARBIDOPA-LEVO ER 50-200 TA: 50-200 | 90 days supply | Qty: 360 | Fill #1

## 2020-02-19 MED FILL — FREESTYLE LITE TEST STRIP: 90 days supply | Qty: 400 | Fill #1

## 2020-02-20 MED FILL — DEXILANT DR 60 MG CAPSULE: 60 | 30 days supply | Qty: 30 | Fill #2

## 2020-02-24 ENCOUNTER — Other Ambulatory Visit: Payer: Self-pay

## 2020-02-24 ENCOUNTER — Ambulatory Visit
Admission: RE | Admit: 2020-02-24 | Discharge: 2020-02-24 | Disposition: A | Discharge status: Home / Self Care | Payer: 59 | Source: Ambulatory Visit | Attending: Family Medicine | Admitting: Family Medicine

## 2020-02-24 DIAGNOSIS — Z780 Asymptomatic menopausal state: Secondary | ICD-10-CM | POA: Diagnosis not present

## 2020-02-24 DIAGNOSIS — Z13820 Encounter for screening for osteoporosis: Secondary | ICD-10-CM | POA: Diagnosis not present

## 2020-02-24 DIAGNOSIS — R072 Precordial pain: Secondary | ICD-10-CM

## 2020-02-24 DIAGNOSIS — E2839 Other primary ovarian failure: Secondary | ICD-10-CM

## 2020-02-25 MED FILL — TECHLITE PEN NDL 32GX1/4: 32G X 6 MM | 90 days supply | Qty: 400 | Fill #1

## 2020-02-26 ENCOUNTER — Other Ambulatory Visit: Payer: 59

## 2020-03-02 ENCOUNTER — Other Ambulatory Visit: Payer: 59 | Admitting: *Deleted

## 2020-03-02 ENCOUNTER — Other Ambulatory Visit: Payer: Self-pay

## 2020-03-02 DIAGNOSIS — R072 Precordial pain: Secondary | ICD-10-CM

## 2020-03-02 LAB — BASIC METABOLIC PANEL
BUN/Creatinine Ratio: 9 — ABNORMAL LOW (ref 12–28)
BUN: 13 mg/dL (ref 8–27)
CO2: 27 mmol/L (ref 20–29)
Calcium: 9.4 mg/dL (ref 8.7–10.3)
Chloride: 98 mmol/L (ref 96–106)
Creatinine, Ser: 1.43 mg/dL — ABNORMAL HIGH (ref 0.57–1.00)
GFR calc Af Amer: 44 mL/min/{1.73_m2} — ABNORMAL LOW (ref >59)
GFR calc non Af Amer: 38 mL/min/{1.73_m2} — ABNORMAL LOW (ref >59)
Glucose: 139 mg/dL — ABNORMAL HIGH (ref 65–99)
Potassium: 4.7 mmol/L (ref 3.5–5.2)
Sodium: 136 mmol/L (ref 134–144)

## 2020-03-02 MED FILL — GABAPENTIN 300 MG CAPSULE: 300 | 90 days supply | Qty: 180 | Fill #0

## 2020-03-03 ENCOUNTER — Encounter (INDEPENDENT_AMBULATORY_CARE_PROVIDER_SITE_OTHER): Payer: Self-pay | Admitting: Family Medicine

## 2020-03-03 ENCOUNTER — Telehealth (HOSPITAL_COMMUNITY): Payer: Self-pay | Admitting: Emergency Medicine

## 2020-03-03 ENCOUNTER — Ambulatory Visit (INDEPENDENT_AMBULATORY_CARE_PROVIDER_SITE_OTHER): Payer: 59 | Admitting: Family Medicine

## 2020-03-03 VITALS — BP 115/69 | HR 67 | Temp 97.6°F | Ht 66.0 in | Wt 237.0 lb

## 2020-03-03 DIAGNOSIS — Z6838 Body mass index (BMI) 38.0-38.9, adult: Secondary | ICD-10-CM | POA: Diagnosis not present

## 2020-03-03 DIAGNOSIS — Z794 Long term (current) use of insulin: Secondary | ICD-10-CM | POA: Diagnosis not present

## 2020-03-03 DIAGNOSIS — E1165 Type 2 diabetes mellitus with hyperglycemia: Secondary | ICD-10-CM | POA: Diagnosis not present

## 2020-03-03 NOTE — Progress Notes (Signed)
Chief Complaint:   OBESITY Deanna Rowe is here to discuss her progress with her obesity treatment plan along with follow-up of her obesity related diagnoses. Deanna Rowe is on the Category 3 Plan and states she is following her eating plan approximately 50% of the time. Deanna Rowe states she is not regularly exercising.  Today's visit was #: 52 Starting weight: 237 lbs Starting date: 04/16/2019 Today's weight: 237 lbs Today's date: 03/03/2020 Total lbs lost to date: 0 Total lbs lost since last in-office visit: 0  Interim History: Deanna Rowe is disappointed and surprised that she didn't lose weight today. She says she has had several bottles of water today in prep for a cardiac CT tomorrow.  Deanna Rowe notes that she and her husband have reduced eating out.  They also have reduced snacks in the home.  They eat out about 2 times per week, but she has cut back on carbs. Deanna was started by Dr. Buddy Duty (endocrinology) and she notices that appetite and snacking have deceased.   Subjective:   1. Type 2 diabetes mellitus with hyperglycemia, with long-term current use of insulin (HCC) Mozel's A1c was 8.0 about 2 weeks ago at Dr. Cindra Eves office.  She is on basal insulin (dose reduced when Deanna added) and meal coverage.  Deanna 0.25 mg added as discussed.  Denies nausea and constipation.  Has had 2 lows (46 and 66) due to skipping a meal (lunch).  Deanna has decreased appetite and snacking.  Lab Results  Component Value Date   HGBA1C 7.9 (H) 04/16/2019   HGBA1C 6.9 11/25/2016   HGBA1C 6.0 06/10/2016   Lab Results  Component Value Date   MICROALBUR <0.7 02/12/2016   LDLCALC 57 04/16/2019   CREATININE 1.43 (H) 03/02/2020   Lab Results  Component Value Date   INSULIN 12.3 04/16/2019   Assessment/Plan:   1. Type 2 diabetes mellitus with hyperglycemia, with long-term current use of insulin ([place]) Set alarm for lunchtime on phone.  Continue all medications.  Follow-up with Dr. Buddy Duty in 3 months as  directed.  2. Class 2 severe obesity with serious comorbidity and body mass index (BMI) of 38.0 to 38.9 in adult, unspecified obesity type (HCC)  Deanna Rowe is currently in the action stage of change. As such, her goal is to continue with weight loss efforts. She has agreed to the Category 3 Plan.   Handouts given:  Recipes.    1.  Deanna Rowe will try to cook 1 meal per week.  2.  She will set an alarm to remind her not to skip lunch.  Exercise goals: Discussed going back to the gym 3 times per week as she has in the past.  Look into video exercises. We discussed looking for walking videos on [employer] ot YouTube.  Behavioral modification strategies: decreasing simple carbohydrates and meal planning and cooking strategies.  Deanna Rowe has agreed to follow-up with our clinic in 3 weeks.   Objective:   Blood pressure 115/69, pulse 67, temperature 97.6 F (36.4 C), height 5\' 6"  (1.676 m), weight 237 lb (107.5 kg), SpO2 97 %. Body mass index is 38.25 kg/m.  General: Cooperative, alert, well developed, in no acute distress. HEENT: Conjunctivae and lids unremarkable. Cardiovascular: Regular rhythm.  Lungs: Normal work of breathing. Neurologic: No focal deficits.   Lab Results  Component Value Date   CREATININE 1.43 (H) 03/02/2020   BUN 13 03/02/2020   NA 136 03/02/2020   K 4.7 03/02/2020   CL 98 03/02/2020   CO2 27 03/02/2020  Lab Results  Component Value Date   ALT 21 04/16/2019   AST 23 04/16/2019   ALKPHOS 58 04/16/2019   BILITOT 0.3 04/16/2019   Lab Results  Component Value Date   HGBA1C 7.9 (H) 04/16/2019   HGBA1C 6.9 11/25/2016   HGBA1C 6.0 06/10/2016   HGBA1C 5.9 02/12/2016   HGBA1C 6.1 12/01/2015   Lab Results  Component Value Date   INSULIN 12.3 04/16/2019   Lab Results  Component Value Date   TSH 1.780 04/16/2019   Lab Results  Component Value Date   CHOL 155 04/16/2019   HDL 84 04/16/2019   LDLCALC 57 04/16/2019   TRIG 71 04/16/2019   Lab Results    Component Value Date   WBC 6.8 04/16/2019   HGB 12.3 04/16/2019   HCT 36.1 04/16/2019   MCV 96 04/16/2019   PLT 274 04/16/2019   Obesity Behavioral Intervention:   Approximately 15 minutes were spent on the discussion below.  ASK: We discussed the diagnosis of obesity with Deanna Rowe today and Deanna Rowe agreed to give Deanna Rowe permission to discuss obesity behavioral modification therapy today.  ASSESS: Deanna Rowe has the diagnosis of obesity and her BMI today is 38.3. Deanna Rowe is in the action stage of change.   ADVISE: Deanna Rowe was educated on the multiple health risks of obesity as well as the benefit of weight loss to improve her health. She was advised of the need for long term treatment and the importance of lifestyle modifications to improve her current health and to decrease her risk of future health problems.  AGREE: Multiple dietary modification options and treatment options were discussed and Deanna Rowe agreed to follow the recommendations documented in the above note.  ARRANGE: Deanna Rowe was educated on the importance of frequent visits to treat obesity as outlined per CMS and USPSTF guidelines and agreed to schedule her next follow up appointment today.  Attestation Statements:   Reviewed by clinician on day of visit: allergies, medications, problem list, medical history, surgical history, family history, social history, and previous encounter notes.  I, Water quality scientist, CMA, am acting as Location manager for [employer], [place].  I have reviewed the above documentation for accuracy and completeness, and I agree with the above. -  Georgianne Fick, FNP

## 2020-03-03 NOTE — Telephone Encounter (Signed)
Reaching out to patient to offer assistance regarding upcoming cardiac imaging study; pt verbalizes understanding of appt date/time, parking situation and where to check in, pre-test NPO status and medications ordered, and verified current allergies; name and call back number provided for further questions should they arise Marchia Bond RN Navigator Cardiac Imaging [facility] [phone] office [phone] cell  Taking 80mg  propanolol 2 hr prior to scan. Avoiding diuretics

## 2020-03-04 ENCOUNTER — Encounter (HOSPITAL_COMMUNITY): Payer: Self-pay

## 2020-03-04 ENCOUNTER — Ambulatory Visit (HOSPITAL_COMMUNITY)
Admission: RE | Admit: 2020-03-04 | Discharge: 2020-03-04 | Disposition: A | Discharge status: Home / Self Care | Payer: 59 | Source: Ambulatory Visit | Attending: Interventional Cardiology | Admitting: Interventional Cardiology

## 2020-03-04 ENCOUNTER — Encounter (INDEPENDENT_AMBULATORY_CARE_PROVIDER_SITE_OTHER): Payer: Self-pay | Admitting: Family Medicine

## 2020-03-04 DIAGNOSIS — R072 Precordial pain: Secondary | ICD-10-CM | POA: Insufficient documentation

## 2020-03-04 MED ORDER — IOHEXOL 350 MG/ML SOLN
80.0000 mL | Freq: Once | INTRAVENOUS | Status: AC | PRN
  Administered 2020-03-04: 80 mL via INTRAVENOUS

## 2020-03-04 MED ORDER — NITROGLYCERIN 0.4 MG SL SUBL
SUBLINGUAL_TABLET | SUBLINGUAL | Status: AC
  Administered 2020-03-04: 0.8 mg via SUBLINGUAL
  Filled 2020-03-04: qty 2

## 2020-03-04 MED ORDER — NITROGLYCERIN 0.4 MG SL SUBL: 0.8000 mg | SUBLINGUAL_TABLET | Freq: Once | SUBLINGUAL | Status: AC

## 2020-03-04 NOTE — Discharge Instructions (Signed)
Cardiac CT Angiogram A cardiac CT angiogram is a procedure to look at the heart and the area around the heart. It may be done to help find the cause of chest pains or other symptoms of heart disease. During this procedure, a substance called contrast dye is injected into the blood vessels in the area to be checked. A large X-ray machine, called a CT scanner, then takes detailed pictures of the heart and the surrounding area. The procedure is also sometimes called a coronary CT angiogram, coronary artery scanning, or CTA. A cardiac CT angiogram allows the health care provider to see how well blood is flowing to and from the heart. The health care provider will be able to see if there are any problems, such as:  Blockage or narrowing of the coronary arteries in the heart.  Fluid around the heart.  Signs of weakness or disease in the muscles, valves, and tissues of the heart. Tell a health care provider about:  Any allergies you have. This is especially important if you have had a previous allergic reaction to contrast dye.  All medicines you are taking, including vitamins, herbs, eye drops, creams, and over-the-counter medicines.  Any blood disorders you have.  Any surgeries you have had.  Any medical conditions you have.  Whether you are pregnant or may be pregnant.  Any anxiety disorders, chronic pain, or other conditions you have that may increase your stress or prevent you from lying still. What are the risks? Generally, this is a safe procedure. However, problems may occur, including:  Bleeding.  Infection.  Allergic reactions to medicines or dyes.  Damage to other structures or organs.  Kidney damage from the contrast dye that is used.  Increased risk of cancer from radiation exposure. This risk is low. Talk with your health care provider about: ? The risks and benefits of testing. ? How you can receive the lowest dose of radiation. What happens before the  procedure?  Wear comfortable clothing and remove any jewelry, glasses, dentures, and hearing aids.  Follow instructions from your health care provider about eating and drinking. This may include: ? For 12 hours before the procedure -- avoid caffeine. This includes tea, coffee, soda, energy drinks, and diet pills. Drink plenty of water or other fluids that do not have caffeine in them. Being well hydrated can prevent complications. ? For 4-6 hours before the procedure -- stop eating and drinking. The contrast dye can cause nausea, but this is less likely if your stomach is empty.  Ask your health care provider about changing or stopping your regular medicines. This is especially important if you are taking diabetes medicines, blood thinners, or medicines to treat problems with erections (erectile dysfunction). What happens during the procedure?   Hair on your chest may need to be removed so that small sticky patches called electrodes can be placed on your chest. These will transmit information that helps to monitor your heart during the procedure.  An IV will be inserted into one of your veins.  You might be given a medicine to control your heart rate during the procedure. This will help to ensure that good images are obtained.  You will be asked to lie on an exam table. This table will slide in and out of the CT machine during the procedure.  Contrast dye will be injected into the IV. You might feel warm, or you may get a metallic taste in your mouth.  You will be given a medicine called   nitroglycerin. This will relax or dilate the arteries in your heart.  The table that you are lying on will move into the CT machine tunnel for the scan.  The person running the machine will give you instructions while the scans are being done. You may be asked to: ? Keep your arms above your head. ? Hold your breath. ? Stay very still, even if the table is moving.  When the scanning is complete, you  will be moved out of the machine.  The IV will be removed. The procedure may vary among health care providers and hospitals. What can I expect after the procedure? After your procedure, it is common to have:  A metallic taste in your mouth from the contrast dye.  A feeling of warmth.  A headache from the nitroglycerin. Follow these instructions at home:  Take over-the-counter and prescription medicines only as told by your health care provider.  If you are told, drink enough fluid to keep your urine pale yellow. This will help to flush the contrast dye out of your body.  Most people can return to their normal activities right after the procedure. Ask your health care provider what activities are safe for you.  It is up to you to get the results of your procedure. Ask your health care provider, or the department that is doing the procedure, when your results will be ready.  Keep all follow-up visits as told by your health care provider. This is important. Contact a health care provider if:  You have any symptoms of allergy to the contrast dye. These include: ? Shortness of breath. ? Rash or hives. ? A racing heartbeat. Summary  A cardiac CT angiogram is a procedure to look at the heart and the area around the heart. It may be done to help find the cause of chest pains or other symptoms of heart disease.  During this procedure, a large X-ray machine, called a CT scanner, takes detailed pictures of the heart and the surrounding area after a contrast dye has been injected into blood vessels in the area.  Ask your health care provider about changing or stopping your regular medicines before the procedure. This is especially important if you are taking diabetes medicines, blood thinners, or medicines to treat erectile dysfunction.  If you are told, drink enough fluid to keep your urine pale yellow. This will help to flush the contrast dye out of your body. This information is not  intended to replace advice given to you by your health care provider. Make sure you discuss any questions you have with your health care provider. Document Revised: 11/14/2018 Document Reviewed: 11/14/2018 Elsevier Patient Education  2020 Elsevier Inc. Testing With IV Contrast Material IV contrast material is a fluid that is used with some imaging tests. It is injected into your body through a vein. Contrast material is used when your health care providers need a detailed look at organs, tissues, or blood vessels that may not show up with the standard test. The material may be used when an X-ray, an MRI, a CT scan, or an ultrasound is done. IV contrast material may be used for imaging tests that check:  Muscles, skin, and fat.  Breasts.  Brain.  Digestive tract.  Heart.  Organs such as the liver, kidneys, lungs, bladder, and many others.  Arteries and veins. Tell a health care provider about:  Any allergies you have, especially an allergy to contrast material.  All medicines you are taking, including   metformin, beta blockers, NSAIDs (such as ibuprofen), interleukin-2, vitamins, herbs, eye drops, creams, and over-the-counter medicines.  Any problems you or family members have had with the use of contrast material.  Any blood disorders you have, such as sickle cell anemia.  Any surgeries you have had.  Any medical conditions you have or have had, especially alcohol abuse, dehydration, asthma, or kidney, liver, or heart problems.  Whether you are pregnant or may be pregnant.  Whether you are breastfeeding. Most contrast materials are safe for use in breastfeeding women. What are the risks? Generally, this is a safe procedure. However, problems may occur, including:  Headache.  Itching, skin rash, and hives.  Nausea and vomiting.  Allergic reactions.  Wheezing or difficulty breathing.  Abnormal heart rate.  Changes in blood pressure.  Throat swelling.  Kidney  damage. What happens before the procedure? Medicines Ask your health care provider about:  Changing or stopping your regular medicines. This is especially important if you are taking diabetes medicines or blood thinners.  Taking medicines such as aspirin and ibuprofen. These medicines can thin your blood. Do not take these medicines unless your health care provider tells you to take them.  Taking over-the-counter medicines, vitamins, herbs, and supplements. If you are at risk of having a reaction to the IV contrast material, you may be asked to take medicine before the procedure to prevent a reaction. General instructions  Follow instructions from your health care provider about eating or drinking restrictions.  You may have an exam or lab tests to make sure that you can safely get IV contrast material.  Ask if you will be given a medicine to help you relax (sedative) during the procedure. If so, plan to have someone take you home from the hospital or clinic. What happens during the procedure?  You may be given a sedative to help you relax.  An IV will be inserted into one of your veins.  Contrast material will be injected into your IV.  You may feel warmth or flushing as the contrast material enters your bloodstream.  You may have a metallic taste in your mouth for a few minutes.  The needle may cause some discomfort and bruising.  After the contrast material is in your body, the imaging test will be done. The procedure may vary among health care providers and hospitals. What can I expect after the procedure?  The IV will be removed.  You may be taken to a recovery area if sedation medicines were used. Your blood pressure, heart rate, breathing rate, and blood oxygen level will be monitored until you leave the hospital or clinic. Follow these instructions at home:   Take over-the-counter and prescription medicines only as told by your health care provider. ? Your health  care provider may tell you to not take certain medicines for a couple of days after the procedure. This is especially important if you are taking diabetes medicines.  If you are told, drink enough fluid to keep your urine pale yellow. This will help to remove the contrast material out of your body.  Do not drive for 24 hours if you were given a sedative during your procedure.  It is up to you to get the results of your procedure. Ask your health care provider, or the department that is doing the procedure, when your results will be ready.  Keep all follow-up visits as told by your health care provider. This is important. Contact a health care provider if:    You have redness, swelling, or pain near your IV site. Get help right away if:  You have an abnormal heart rhythm.  You have trouble breathing.  You have: ? Chest pain. ? Pain in your back, neck, arm, jaw, or stomach. ? Nausea or sweating. ? Hives or a rash.  You start shaking and cannot stop. These symptoms may represent a serious problem that is an emergency. Do not wait to see if the symptoms will go away. Get medical help right away. Call your local emergency services (911 in the [place] Do not drive yourself to the hospital. Summary  IV contrast material may be used for imaging tests to help your health care providers see your organs and tissues more clearly.  Tell your health care provider if you are pregnant or may be pregnant.  During the procedure, you may feel warmth or flushing as the contrast material enters your bloodstream.  After the procedure, drink enough fluid to keep your urine pale yellow. This information is not intended to replace advice given to you by your health care provider. Make sure you discuss any questions you have with your health care provider. Document Revised: 06/07/2018 Document Reviewed: 06/07/2018 Elsevier Patient Education  2020 Elsevier Inc.  

## 2020-03-06 ENCOUNTER — Other Ambulatory Visit (HOSPITAL_BASED_OUTPATIENT_CLINIC_OR_DEPARTMENT_OTHER): Payer: Self-pay | Admitting: Internal Medicine

## 2020-03-06 ENCOUNTER — Other Ambulatory Visit: Payer: Self-pay | Admitting: Allergy and Immunology

## 2020-03-06 ENCOUNTER — Telehealth: Payer: Self-pay | Admitting: Interventional Cardiology

## 2020-03-06 MED FILL — LORazepam 2 MG TABS: 2 | 90 days supply | Qty: 270 | Fill #1

## 2020-03-06 MED FILL — FAMOTIDINE 20 MG TABS: 20 | 30 days supply | Qty: 60 | Fill #0

## 2020-03-06 MED FILL — SOLIFENACIN SUCCINATE 10 MG: 10 | 30 days supply | Qty: 30 | Fill #9

## 2020-03-06 MED FILL — MONTELUKAST SOD 10 MG TAB: 10 | 30 days supply | Qty: 30 | Fill #0

## 2020-03-06 MED FILL — rOPINIRole HCL 2 MG TABS: 2 | 90 days supply | Qty: 180 | Fill #0

## 2020-03-06 MED FILL — LEVOCETIRIZINE 5 MG TABLET: 5 | 30 days supply | Qty: 30 | Fill #1

## 2020-03-06 MED FILL — ATORVASTATIN CALCIUM 10 MG: 10 | 90 days supply | Qty: 90 | Fill #0

## 2020-03-06 MED FILL — ZOLPIDEM TARTRATE 5 MG TAB: 5 | 90 days supply | Qty: 90 | Fill #1

## 2020-03-06 MED FILL — PROPRANOLOL 40 MG TABLET: 40 | 90 days supply | Qty: 180 | Fill #2

## 2020-03-06 NOTE — Telephone Encounter (Signed)
I spoke with patient who is calling regarding losartan/HCTZ.  She was not sure of the message from the other day.  Patient reports she took losartan/HCTZ the day of the CT scan.  She did not take yesterday.  I told her she should hold again today.  She has been drinking plenty of water. Results of Cardiac CT reviewed with patient.

## 2020-03-06 NOTE — Telephone Encounter (Signed)
° ° °  Pt would like to speak with Dr. Hassell Done nurse

## 2020-03-10 DIAGNOSIS — H903 Sensorineural hearing loss, bilateral: Secondary | ICD-10-CM | POA: Diagnosis not present

## 2020-03-10 MED FILL — MYRBETRIQ ER 50 MG TABLET: 50 | 30 days supply | Qty: 30 | Fill #7

## 2020-03-17 ENCOUNTER — Ambulatory Visit: Payer: 59 | Admitting: Allergy and Immunology

## 2020-03-18 MED FILL — XOLAIR 150 MG SOLR: 150 | 28 days supply | Qty: 2 | Fill #3

## 2020-03-23 ENCOUNTER — Ambulatory Visit: Payer: Self-pay

## 2020-03-23 ENCOUNTER — Other Ambulatory Visit: Payer: Self-pay | Admitting: Family Medicine

## 2020-03-23 ENCOUNTER — Ambulatory Visit (INDEPENDENT_AMBULATORY_CARE_PROVIDER_SITE_OTHER): Payer: 59

## 2020-03-23 DIAGNOSIS — J4540 Moderate persistent asthma, uncomplicated: Secondary | ICD-10-CM

## 2020-03-23 DIAGNOSIS — Z1231 Encounter for screening mammogram for malignant neoplasm of breast: Secondary | ICD-10-CM

## 2020-03-24 ENCOUNTER — Other Ambulatory Visit: Payer: Self-pay

## 2020-03-24 ENCOUNTER — Ambulatory Visit (INDEPENDENT_AMBULATORY_CARE_PROVIDER_SITE_OTHER): Payer: 59 | Admitting: Family Medicine

## 2020-03-24 ENCOUNTER — Encounter (INDEPENDENT_AMBULATORY_CARE_PROVIDER_SITE_OTHER): Payer: Self-pay | Admitting: Family Medicine

## 2020-03-24 VITALS — BP 112/66 | Temp 97.6°F | Ht 66.0 in | Wt 232.0 lb

## 2020-03-24 DIAGNOSIS — E1165 Type 2 diabetes mellitus with hyperglycemia: Secondary | ICD-10-CM

## 2020-03-24 DIAGNOSIS — Z6837 Body mass index (BMI) 37.0-37.9, adult: Secondary | ICD-10-CM

## 2020-03-24 DIAGNOSIS — Z794 Long term (current) use of insulin: Secondary | ICD-10-CM | POA: Diagnosis not present

## 2020-03-24 NOTE — Progress Notes (Signed)
Chief Complaint:   OBESITY Deanna Rowe is here to discuss her progress with her obesity treatment plan along with follow-up of her obesity related diagnoses. Deanna Rowe is on the Category 3 Plan and states she is following her eating plan approximately 25% of the time. [name] states she is not regularly exercising.  Today's visit was #: 20 Starting weight: 237 lbs Starting date: 04/16/2019 Today's weight: 232 lbs Today's date: 03/24/2020 Total lbs lost to date: 5 lbs Total lbs lost since last in-office visit: 5 lbs  Interim History: [name] is no longer skipping lunch.  She reports that her appetite is much improved with Ozempic.  She is snacking less.  Hunger at night is much better. She and her husband are still eating out for supper 2 times per week.  She has Special K cereal at breakfast and a sandwich at lunch.  She is drinking lemonade with zero sugar.  Hunger at night is much better.  She says she is not using her snack calories.  Subjective:   1. Type 2 diabetes mellitus with hyperglycemia, with long-term current use of insulin (HCC) Not well controlled.  On Basaglar 10 units in the morning, Humalog sliding scale meal coverage.  Fasting CBGs 101-120s.  After lunch 140-160s.  She says sugars are much better.  Denies hypoglycemia.  A1c was 8 in November.  Lab Results  Component Value Date   HGBA1C 7.9 (H) 04/16/2019   HGBA1C 6.9 11/25/2016   HGBA1C 6.0 06/10/2016   Lab Results  Component Value Date   MICROALBUR <0.7 02/12/2016   LDLCALC 57 04/16/2019   CREATININE 1.43 (H) 03/02/2020   Lab Results  Component Value Date   INSULIN 12.3 04/16/2019   Assessment/Plan:   1. Type 2 diabetes mellitus with hyperglycemia, with long-term current use of insulin (HCC) Continue all medications.  Follow-up with Dr. Buddy Duty in January.  2. Class 2 severe obesity with serious comorbidity and body mass index (BMI) of 37.0 to 37.9 in adult, unspecified obesity type (HCC)  [name] is  currently in the action stage of change. As such, her goal is to continue with weight loss efforts. She has agreed to the Category 3 Plan.   Exercise goals: All adults should avoid inactivity. Some physical activity is better than none, and adults who participate in any amount of physical activity gain some health benefits.  Behavioral modification strategies: increasing lean protein intake and decreasing simple carbohydrates.  Ruchy has agreed to follow-up with our clinic in 3 weeks.   Objective:   Blood pressure 112/66, temperature 97.6 F (36.4 C), height 5\' 6"  (1.676 m), weight 232 lb (105.2 kg), SpO2 98 %. Body mass index is 37.45 kg/m.  General: Cooperative, alert, well developed, in no acute distress. HEENT: Conjunctivae and lids unremarkable. Cardiovascular: Regular rhythm.  Lungs: Normal work of breathing. Neurologic: No focal deficits.   Lab Results  Component Value Date   CREATININE 1.43 (H) 03/02/2020   BUN 13 03/02/2020   NA 136 03/02/2020   K 4.7 03/02/2020   CL 98 03/02/2020   CO2 27 03/02/2020   Lab Results  Component Value Date   ALT 21 04/16/2019   AST 23 04/16/2019   ALKPHOS 58 04/16/2019   BILITOT 0.3 04/16/2019   Lab Results  Component Value Date   HGBA1C 7.9 (H) 04/16/2019   HGBA1C 6.9 11/25/2016   HGBA1C 6.0 06/10/2016   HGBA1C 5.9 02/12/2016   HGBA1C 6.1 12/01/2015   Lab Results  Component Value Date  INSULIN 12.3 04/16/2019   Lab Results  Component Value Date   TSH 1.780 04/16/2019   Lab Results  Component Value Date   CHOL 155 04/16/2019   HDL 84 04/16/2019   LDLCALC 57 04/16/2019   TRIG 71 04/16/2019   Lab Results  Component Value Date   WBC 6.8 04/16/2019   HGB 12.3 04/16/2019   HCT 36.1 04/16/2019   MCV 96 04/16/2019   PLT 274 04/16/2019   Attestation Statements:   Reviewed by clinician on day of visit: allergies, medications, problem list, medical history, surgical history, family history, social history, and  previous encounter notes.  I, Water quality scientist, CMA, am acting as Location manager for [employer], [place].  I have reviewed the above documentation for accuracy and completeness, and I agree with the above. -  Georgianne Fick, FNP

## 2020-03-25 MED FILL — OZEMPIC 0.25 OR 0.5 MG/DOSE: 2 | 30 days supply | Qty: 2 | Fill #1

## 2020-04-09 ENCOUNTER — Other Ambulatory Visit (HOSPITAL_BASED_OUTPATIENT_CLINIC_OR_DEPARTMENT_OTHER): Payer: Self-pay | Admitting: Psychiatry

## 2020-04-09 ENCOUNTER — Other Ambulatory Visit: Payer: Self-pay | Admitting: Allergy and Immunology

## 2020-04-09 MED FILL — TRINTELLIX 20 MG TABLET: 20 | 30 days supply | Qty: 30 | Fill #0

## 2020-04-09 MED FILL — DEXILANT DR 60 MG CAPSULE: 60 | 30 days supply | Qty: 30 | Fill #3

## 2020-04-09 MED FILL — AMLODIPINE BESYLATE 5 MG TA: 5 | 90 days supply | Qty: 90 | Fill #1

## 2020-04-09 MED FILL — MONTELUKAST SOD 10 MG TAB: 10 | 30 days supply | Qty: 30 | Fill #1

## 2020-04-09 MED FILL — MYRBETRIQ ER 50 MG TABLET: 50 | 30 days supply | Qty: 30 | Fill #8

## 2020-04-09 MED FILL — REXULTI 2 MG TABLET: 2 | 90 days supply | Qty: 90 | Fill #1

## 2020-04-09 MED FILL — FAMOTIDINE 20 MG TABS: 20 | 30 days supply | Qty: 60 | Fill #1

## 2020-04-09 MED FILL — SOLIFENACIN SUCCINATE 10 MG: 10 | 30 days supply | Qty: 30 | Fill #10

## 2020-04-09 NOTE — Telephone Encounter (Signed)
Called to notify patient that her refill can be sent in. Patient said that she didn't need another refill due to the company sending her a refill though the mail.

## 2020-04-10 ENCOUNTER — Other Ambulatory Visit (HOSPITAL_BASED_OUTPATIENT_CLINIC_OR_DEPARTMENT_OTHER): Payer: Self-pay | Admitting: Obstetrics and Gynecology

## 2020-04-10 MED FILL — VALACYCLOVIR HCL 500 MG TAB: 500 | 90 days supply | Qty: 90 | Fill #0

## 2020-04-14 ENCOUNTER — Ambulatory Visit (INDEPENDENT_AMBULATORY_CARE_PROVIDER_SITE_OTHER): Payer: 59 | Admitting: Family Medicine

## 2020-04-14 ENCOUNTER — Other Ambulatory Visit: Payer: Self-pay

## 2020-04-14 ENCOUNTER — Encounter (INDEPENDENT_AMBULATORY_CARE_PROVIDER_SITE_OTHER): Payer: Self-pay | Admitting: Family Medicine

## 2020-04-14 VITALS — BP 115/75 | Temp 98.7°F | Ht 66.0 in | Wt 228.0 lb

## 2020-04-14 DIAGNOSIS — Z794 Long term (current) use of insulin: Secondary | ICD-10-CM

## 2020-04-14 DIAGNOSIS — E1122 Type 2 diabetes mellitus with diabetic chronic kidney disease: Secondary | ICD-10-CM | POA: Diagnosis not present

## 2020-04-14 DIAGNOSIS — N1832 Chronic kidney disease, stage 3b: Secondary | ICD-10-CM | POA: Diagnosis not present

## 2020-04-14 DIAGNOSIS — Z6838 Body mass index (BMI) 38.0-38.9, adult: Secondary | ICD-10-CM

## 2020-04-14 DIAGNOSIS — E7141 Primary carnitine deficiency: Secondary | ICD-10-CM

## 2020-04-14 DIAGNOSIS — R5383 Other fatigue: Secondary | ICD-10-CM | POA: Diagnosis not present

## 2020-04-14 MED FILL — XOLAIR 150 MG SOLR: 150 | 28 days supply | Qty: 2 | Fill #4

## 2020-04-16 ENCOUNTER — Other Ambulatory Visit (HOSPITAL_BASED_OUTPATIENT_CLINIC_OR_DEPARTMENT_OTHER): Payer: Self-pay | Admitting: Psychiatry

## 2020-04-16 DIAGNOSIS — F3181 Bipolar II disorder: Secondary | ICD-10-CM | POA: Diagnosis not present

## 2020-04-16 MED FILL — buPROPion HCL ER (XL) 150 M: 150 | 90 days supply | Qty: 270 | Fill #0

## 2020-04-16 NOTE — Progress Notes (Signed)
Chief Complaint:   OBESITY Deanna Rowe is here to discuss her progress with her obesity treatment plan along with follow-up of her obesity related diagnoses. Deanna Rowe is on the Category 3 Plan and states she is following her eating plan approximately 50% of the time. Deanna Rowe states she is not exercising.  Today's visit was #: 21 Starting weight: 237 lbs Starting date: 04/16/2019 Today's weight: 228 lbs Today's date: 04/14/2020 Total lbs lost to date: 9 lbs Total lbs lost since last in-office visit: 4 lbs  Interim History: Deanna Rowe did well and has lost 4 lbs over the Christmas holiday. She notes extreme fatigue. Deanna Rowe is eating special K cereal with fairlife milk at breakfast, [place] sandwich for lunch, and a frozen meal with extra protein for dinner. Snacking after dinner is much less than it used to be since starting Ozempic.  Subjective:   1. Type 2 diabetes mellitus with chronic kidney disease, with long-term current use of insulin, unspecified CKD stage (HCC) Medications reviewed.  Not well controlled. DM managed by Dr. Buddy Duty. Last A1c was 8.09 February 2020. Ozempic started. CBGs 120-130s.. 2 hour post prandial 100-130. No hypoglycemia. Deanna Rowe is on Basaglar 10 mg, Humalog 14 units at breakfast, 12 units at lunch, and 16 units supper.  Lab Results  Component Value Date   HGBA1C 7.9 (H) 04/16/2019   HGBA1C 6.9 11/25/2016   HGBA1C 6.0 06/10/2016   Lab Results  Component Value Date   MICROALBUR <0.7 02/12/2016   LDLCALC 57 04/16/2019   CREATININE 1.43 (H) 03/02/2020   Lab Results  Component Value Date   INSULIN 12.3 04/16/2019    2. Other fatigue Deanna Rowe notes extreme fatigue between Thanksgiving and Christmas. She is compliant with CPAP( at least 4 hours a night). No hypothyroidism. Last colonoscopy was 5 years ago.   Assessment/Plan:   1. Type 2 diabetes mellitus with chronic kidney disease, with long-term current use of insulin, unspecified CKD stage ([place]) Deanna Rowe  will continue all medications. Follow up with Dr Buddy Duty next week.  2. Other fatigue Deanna Rowe will make an appointment with Dr Drema Dallas (PCP) to see about fatigue this week.  3. Class 2 severe obesity with serious comorbidity and body mass index (BMI) of 38.0 to 38.9 in adult, unspecified obesity type (HCC)  Deanna Rowe is currently in the action stage of change. As such, her goal is to continue with weight loss efforts. She has agreed to the Category 3 Plan.   Exercise goals: Discuss joining gym with husband.   Behavioral modification strategies: increasing lean protein intake.  Deanna Rowe has agreed to follow-up with our clinic in 3 weeks. She was informed of the importance of frequent follow-up visits to maximize her success with intensive lifestyle modifications for her multiple health conditions.    Objective:   Blood pressure 115/75, temperature 98.7 F (37.1 C), height 5\' 6"  (1.676 m), weight 228 lb (103.4 kg), SpO2 96 %. Body mass index is 36.8 kg/m.  General: Cooperative, alert, well developed, in no acute distress. HEENT: Conjunctivae and lids unremarkable. Cardiovascular: Regular rhythm.  Lungs: Normal work of breathing. Neurologic: No focal deficits.   Lab Results  Component Value Date   CREATININE 1.43 (H) 03/02/2020   BUN 13 03/02/2020   NA 136 03/02/2020   K 4.7 03/02/2020   CL 98 03/02/2020   CO2 27 03/02/2020   Lab Results  Component Value Date   ALT 21 04/16/2019   AST 23 04/16/2019   ALKPHOS 58 04/16/2019   BILITOT 0.3 04/16/2019  Lab Results  Component Value Date   HGBA1C 7.9 (H) 04/16/2019   HGBA1C 6.9 11/25/2016   HGBA1C 6.0 06/10/2016   HGBA1C 5.9 02/12/2016   HGBA1C 6.1 12/01/2015   Lab Results  Component Value Date   INSULIN 12.3 04/16/2019   Lab Results  Component Value Date   TSH 1.780 04/16/2019   Lab Results  Component Value Date   CHOL 155 04/16/2019   HDL 84 04/16/2019   LDLCALC 57 04/16/2019   TRIG 71 04/16/2019   Lab Results   Component Value Date   WBC 6.8 04/16/2019   HGB 12.3 04/16/2019   HCT 36.1 04/16/2019   MCV 96 04/16/2019   PLT 274 04/16/2019   No results found for: IRON, TIBC, FERRITIN  Obesity Behavioral Intervention:   Approximately 15 minutes were spent on the discussion below.  ASK: We discussed the diagnosis of obesity with Deanna Rowe today and Deanna Rowe agreed to give [name] permission to discuss obesity behavioral modification therapy today.  ASSESS: Deanna Rowe has the diagnosis of obesity and her BMI today is 36.8. Deanna Rowe is in the action stage of change.   ADVISE: Deanna Rowe was educated on the multiple health risks of obesity as well as the benefit of weight loss to improve her health. She was advised of the need for long term treatment and the importance of lifestyle modifications to improve her current health and to decrease her risk of future health problems.  AGREE: Multiple dietary modification options and treatment options were discussed and Deanna Rowe agreed to follow the recommendations documented in the above note.  ARRANGE: Deanna Rowe was educated on the importance of frequent visits to treat obesity as outlined per CMS and USPSTF guidelines and agreed to schedule her next follow up appointment today.  Attestation Statements:   Reviewed by clinician on day of visit: allergies, medications, problem list, medical history, surgical history, family history, social history, and previous encounter notes.    Tula Nakayama, am acting as Location manager for Georgianne Fick, FNP.  I have reviewed the above documentation for accuracy and completeness, and I agree with the above. -  Georgianne Fick, FNP

## 2020-04-17 ENCOUNTER — Ambulatory Visit: Payer: Self-pay

## 2020-04-17 ENCOUNTER — Ambulatory Visit (INDEPENDENT_AMBULATORY_CARE_PROVIDER_SITE_OTHER): Payer: 59

## 2020-04-17 ENCOUNTER — Other Ambulatory Visit: Payer: Self-pay

## 2020-04-17 DIAGNOSIS — J4540 Moderate persistent asthma, uncomplicated: Secondary | ICD-10-CM

## 2020-04-20 ENCOUNTER — Ambulatory Visit: Payer: Self-pay

## 2020-04-20 ENCOUNTER — Encounter (INDEPENDENT_AMBULATORY_CARE_PROVIDER_SITE_OTHER): Payer: Self-pay | Admitting: Family Medicine

## 2020-04-22 ENCOUNTER — Telehealth: Payer: Self-pay | Admitting: Neurology

## 2020-04-22 ENCOUNTER — Other Ambulatory Visit: Payer: Self-pay | Admitting: Allergy and Immunology

## 2020-04-22 ENCOUNTER — Encounter: Payer: Self-pay | Admitting: Neurology

## 2020-04-22 ENCOUNTER — Other Ambulatory Visit: Payer: Self-pay

## 2020-04-22 ENCOUNTER — Ambulatory Visit (INDEPENDENT_AMBULATORY_CARE_PROVIDER_SITE_OTHER): Payer: 59 | Admitting: Neurology

## 2020-04-22 VITALS — BP 111/74 | HR 69 | Ht 66.0 in | Wt 230.8 lb

## 2020-04-22 DIAGNOSIS — G20 Parkinson's disease: Secondary | ICD-10-CM | POA: Diagnosis not present

## 2020-04-22 DIAGNOSIS — R269 Unspecified abnormalities of gait and mobility: Secondary | ICD-10-CM | POA: Diagnosis not present

## 2020-04-22 MED ORDER — CARBIDOPA-LEVODOPA 25-250 MG PO TABS: 1.0000 | ORAL_TABLET | Freq: Every morning | ORAL | 1 refills | Status: DC

## 2020-04-22 MED ORDER — CARBIDOPA-LEVODOPA ER 50-200 MG PO TBCR
1.0000 | EXTENDED_RELEASE_TABLET | Freq: Every day | ORAL | 1 refills | Status: DC
Start: 2020-04-22 — End: 2020-04-22

## 2020-04-22 MED FILL — LEVOCETIRIZINE 5 MG TABLET: 5 | 30 days supply | Qty: 30 | Fill #0

## 2020-04-22 MED FILL — CARBIDOPA-LEVO ER 50-200 TA: 50-200 | 90 days supply | Qty: 450 | Fill #0

## 2020-04-22 NOTE — Patient Instructions (Signed)
We will add Simemet 25/250 mg in the morning at 6 or 7 am, then take the Sinemet CR 50/200 mg 5 times a day about every 3 hours until you go to bed.  Sinemet (carbidopa) may result in confusion or hallucinations, drowsiness, nausea, or dizziness. If any significant side effects are noted, please contact our office. Sinemet may not be well absorbed when taken with high protein meals, if tolerated it is best to take 30-45 minutes before you eat.

## 2020-04-22 NOTE — Telephone Encounter (Signed)
I called and spoke with patient and patient's husband and we broke down how she should now be taking her Sinemet doses. They wrote it down and read it back to me. They voiced understanding and appreciation and aware to call back if they have any additional questions.

## 2020-04-22 NOTE — Progress Notes (Signed)
Reason for visit: Parkinson's disease, gait disorder  Deanna Rowe is an 65 y.o. female  History of present illness:  Ms. Deanna Rowe is a 65 year old right-handed black female with a history of Parkinson's disease.  The patient has a history of depression with prior ECT treatments and a mild memory disturbance.  She has sleep apnea on CPAP.  The patient does have a lot of daytime drowsiness, she may take a nap between 12 noon and 3 PM every day, and she goes to bed at 9 PM and wakes up around eight or 9 AM.  The patient has a lot of stiffness in the morning and at night when she tries to get up and go to the bathroom.  She is noticing that the Sinemet CR last only about 3 hours, and she will have trouble walking after that.  She otherwise tolerates the medications fairly well.  She remains on Requip 2 mg twice daily.  She is having some falls or stumbles almost every day.  She has a Rollator which she uses inside the house which helps.  She returns to the office today for further evaluation.  She denies any hallucinations or confusion on the medication.  Past Medical History:  Diagnosis Date  . Anemia   . Anxiety   . Arthritis    L HIP  . Asthma   . Avascular necrosis of hip (HCC)    LEFT  . Back pain   . Chest pain   . Constipation   . Depression   . Diabetes mellitus   . Diabetes mellitus, type II ([place])   . Dry cough   . Edema, lower extremity   . Gait abnormality 11/18/2019  . GERD (gastroesophageal reflux disease)   . High cholesterol   . Hypertension   . Insomnia    takes Ambien nightly  . Joint pain   . Kidney disease   . Kidney disease   . Obesity   . Parkinsonism ([place]) 03/02/2017  . PONV (postoperative nausea and vomiting)   . RLS (restless legs syndrome) 01/15/2018  . Sleep apnea   . Swallowing difficulty   . Thyroid disease   . TIA (transient ischemic attack) 2012   no residual problems  . Urgency incontinence   . Vitamin D deficiency     Past Surgical  History:  Procedure Laterality Date  . DILATION AND CURETTAGE OF UTERUS    . ESOPHAGEAL MANOMETRY N/A 09/03/2012   Procedure: ESOPHAGEAL MANOMETRY (EM);  Surgeon: Garlan Fair, MD;  Location: [facility];  Service: Endoscopy;  Laterality: N/A;  . ESOPHAGEAL MANOMETRY N/A 06/19/2017   Procedure: ESOPHAGEAL MANOMETRY (EM);  Surgeon: Clarene Essex, MD;  Location: [facility];  Service: Endoscopy;  Laterality: N/A;  . EXPLORATORY LAPAROTOMY    . GANGLION CYST EXCISION    . JOINT REPLACEMENT  2011   rt total hip  . KNEE ARTHROSCOPY    . PILONIDAL CYST / SINUS EXCISION    . TOTAL HIP ARTHROPLASTY Left 10/09/2013   Procedure: LEFT TOTAL HIP ARTHROPLASTY ANTERIOR APPROACH;  Surgeon: [name], MD;  Location: [facility];  Service: Orthopedics;  Laterality: Left;    Family History  Problem Relation Age of Onset  . Alcohol abuse Brother   . Alcohol abuse Brother   . Alcohol abuse Brother   . Alcohol abuse Brother   . High blood pressure Mother   . Hyperlipidemia Mother   . Heart disease Mother   . Heart disease Father   .  Alcoholism Father   . Allergic rhinitis Sister   . Diabetes Neg Hx   . Angioedema Neg Hx   . Asthma Neg Hx   . Eczema Neg Hx   . Immunodeficiency Neg Hx   . Urticaria Neg Hx   . Breast cancer Neg Hx     Social history:  reports that she has never smoked. She has never used smokeless tobacco. She reports that she does not drink alcohol and does not use drugs.    Allergies  Allergen Reactions  . Fluticasone-Salmeterol Anaphylaxis  . Codeine Nausea And Vomiting  . Fetzima [Levomilnacipran] Nausea And Vomiting  . Nsaids Other (See Comments)    Upset stomach   . Trulicity [Dulaglutide] Nausea And Vomiting    Significant and undesirably rapid (40 lbs) weight loss   . Xanax [Alprazolam] Other (See Comments)    disoriented  . Amoxicillin Rash    Has patient had a PCN reaction causing immediate rash, facial/tongue/throat swelling, SOB or lightheadedness with  hypotension: Yes Has patient had a PCN reaction causing severe rash involving mucus membranes or skin necrosis: No Has patient had a PCN reaction that required hospitalization: No Has patient had a PCN reaction occurring within the last 10 years: No If all of the above answers are "NO", then may proceed with Cephalosporin use.   . Pseudoephedrine Hcl Er Palpitations    Medications:  Prior to Admission medications   Medication Sig Start Date End Date Taking? Authorizing Provider  ACCU-CHEK FASTCLIX LANCETS MISC USE TO CHECK BLOOD SUGAR 3 TIMES A DAY 02/27/18  Yes Renato Shin, MD  ACCU-CHEK GUIDE test strip USE TO CHECK BLOOD SUGAR 3 TIMES PER DAY. Patient taking differently: Use to check blood sugar 4 times per day. 02/02/17  Yes Renato Shin, MD  albuterol (PROVENTIL HFA;VENTOLIN HFA) 108 (90 Base) MCG/ACT inhaler Inhale 2 puffs into the lungs as needed for wheezing or shortness of breath. 02/08/18  Yes Bobbitt, Sedalia Muta, MD  amLODipine (NORVASC) 5 MG tablet Take 5 mg by mouth every morning.    Yes [provider]  aspirin EC 81 MG tablet Take 81 mg by mouth every evening.   Yes [provider]  atorvastatin (LIPITOR) 10 MG tablet Take 10 mg by mouth at bedtime. 12/05/17  Yes [provider]  azelastine (ASTELIN) 0.1 % nasal spray Place 2 sprays into both nostrils 2 (two) times daily. Use in each nostril as directed 02/08/18  Yes Bobbitt, Sedalia Muta, MD  Brexpiprazole (REXULTI) 2 MG TABS Take 2 mg by mouth daily. Patient taking differently: Take 1 mg by mouth daily. 07/10/18  Yes Kathrynn Ducking, MD  budesonide-formoterol [facility]) 160-4.5 MCG/ACT inhaler 2 puffs twice a day with spacer 07/25/19  Yes Bobbitt, Sedalia Muta, MD  buPROPion (WELLBUTRIN XL) 150 MG 24 hr tablet Take 450 mg by mouth every morning.    Yes [provider]  Calcium Carbonate-Vitamin D 600-400 MG-UNIT tablet Take 1 tablet by mouth daily.   Yes [provider]   carbidopa-levodopa (SINEMET CR) 50-200 MG tablet Take 1 tablet by mouth in the morning, at noon, in the evening, and at bedtime. 11/18/19  Yes Kathrynn Ducking, MD  dexlansoprazole (DEXILANT) 60 MG capsule Take 1 capsule (60 mg total) by mouth daily. 12/17/19  Yes Bobbitt, Sedalia Muta, MD  EPINEPHrine (EPIPEN 2-PAK) 0.3 mg/0.3 mL IJ SOAJ injection Inject 0.3 mg into the muscle as needed for anaphylaxis. 12/13/19  Yes Bobbitt, Sedalia Muta, MD  famotidine (PEPCID) 20 MG  tablet TAKE 1 TABLET (20 MG TOTAL) BY MOUTH 2 (TWO) TIMES DAILY. 01/27/20  Yes Bobbitt, Sedalia Muta, MD  fluticasone [facility]) 50 MCG/ACT nasal spray Place 2 sprays into both nostrils daily. 09/24/19  Yes Ambs, Kathrine Cords, FNP  furosemide (LASIX) 20 MG tablet Take 20 mg by mouth every other day.   Yes [provider]  gabapentin (NEURONTIN) 300 MG capsule Take 300 mg by mouth 2 (two) times daily.    Yes [provider]  hydrochlorothiazide (HYDRODIURIL) 12.5 MG tablet Take 12.5 mg by mouth daily.    Yes [provider]  Insulin Glargine (BASAGLAR KWIKPEN) 100 UNIT/ML Inject 0.4 mLs (40 Units total) into the skin daily. 11/05/19  Yes Whitmire, Dawn W, FNP  insulin lispro (HUMALOG) 100 UNIT/ML injection SQ - 14 units at breakfast, 12 units at lunch, 16 units at supper 11/05/19  Yes Whitmire, Dawn W, FNP  Lancets (FREESTYLE) lancets  06/12/19  Yes [provider]  levocetirizine (XYZAL) 5 MG tablet TAKE 1 TABLET BY MOUTH EVERY EVENING 02/03/20  Yes Bobbitt, Sedalia Muta, MD  LORazepam (ATIVAN) 2 MG tablet Take 2 mg by mouth 3 (three) times daily as needed. 06/03/19  Yes [provider]  losartan-hydrochlorothiazide (HYZAAR) 100-12.5 MG tablet Take 1 tablet by mouth daily. 10/22/19  Yes [provider]  mirabegron ER (MYRBETRIQ) 50 MG TB24 tablet Take 50 mg by mouth daily.   Yes [provider]  montelukast (SINGULAIR) 10 MG tablet TAKE 1 TABLET (10 MG TOTAL) BY MOUTH AT BEDTIME. 03/06/20   Yes Bobbitt, Sedalia Muta, MD  Olopatadine HCl (PATADAY) 0.2 % SOLN Place 1 drop into both eyes daily as needed. 06/13/19  Yes Bobbitt, Sedalia Muta, MD  omalizumab Arvid Right) 150 MG injection Inject 300 mg into the skin every 28 (twenty-eight) days. Dx J45.40- PA OF:[id number] 12/09/19-12/04/20- copay card ID [id number] BIN UX:[id number], Payer [place]:[id number] 12/10/19  Yes Tresa Garter, MD  propranolol (INDERAL) 40 MG tablet TAKE 1 TABLET (40 MG TOTAL) BY MOUTH 2 (TWO) TIMES DAILY. 09/05/19  Yes Jettie Booze, MD  rOPINIRole (REQUIP) 2 MG tablet Take 2 mg by mouth 2 (two) times daily. 03/16/15  Yes [provider]  Semaglutide,0.25 or 0.5MG /DOS, (OZEMPIC, 0.25 OR 0.5 MG/DOSE,) 2 MG/1.5ML SOPN Inject 0.25 mg into the skin once a week.   Yes [provider]  solifenacin (VESICARE) 10 MG tablet Take 10 mg by mouth daily. 06/05/19  Yes [provider]  valACYclovir (VALTREX) 500 MG tablet Take 500 mg by mouth daily. 10/09/19  Yes [provider]  vitamin C (ASCORBIC ACID) 500 MG tablet Take 500 mg by mouth daily.   Yes [provider]  Vitamin D, Ergocalciferol, 2000 units CAPS Take 1 capsule by mouth daily. 2000 units daily.   Yes [provider]  vortioxetine HBr (TRINTELLIX) 20 MG TABS tablet Take 20 mg by mouth daily.    Yes [provider]  zolpidem (AMBIEN) 5 MG tablet Take 5 mg by mouth at bedtime.    Yes [provider]    ROS:  Out of a complete 14 system review of symptoms, the patient complains only of the following symptoms, and all other reviewed systems are negative.  Walking difficulty, stiffness Falls  Blood pressure 111/74, pulse 69, height 5\' 6"  (1.676 m), weight 230 lb 12.8 oz (104.7 kg).  Physical Exam  General: The patient is alert and cooperative at the time of the examination.  Skin: No significant peripheral edema is noted.  Neurologic Exam  Mental status: The patient is alert and oriented x 3 at the time of  the examination. The patient has apparent normal recent and remote memory, with an apparently normal attention span and concentration ability.   Cranial nerves: Facial symmetry is present. Speech is normal, no aphasia or dysarthria is noted. Extraocular movements are full. Visual fields are full.  Masking of the face is seen.  Motor: The patient has good strength in all 4 extremities.  Sensory examination: Soft touch sensation is symmetric on the face, arms, and legs.  Coordination: The patient has good finger-nose-finger and heel-to-shin bilaterally.  Gait and station: The is able to arise from a seated position with some difficulty.  Once up, the patient will walk independently, she does have slow deliberate steps, stooped posture.  Tandem gait was not attempted.  Romberg is negative.  Reflexes: Deep tendon reflexes are symmetric.   Assessment/Plan:  1.  Parkinson's disease  2.  Gait disorder  The patient is having ongoing issues with freezing up.  I discussed the possibility getting physical therapy but they are hesitant to do this due to financial concerns.  The patient will need to go up on the medication, she is wearing off after only about 3 hours.  The patient will take Sinemet 25/250 mg around six or 7 in the morning and not attempt to get out of bed until half an hour or an hour later.  She may need to do the same thing in the middle of the night when she is to use the bathroom.  The patient will increase the Sinemet CR 50/200 mg tablets taking 1 tablet every 3 hours, up to 5 tablets during the day.  She will follow-up here in 4 months.  Jill Alexanders MD 04/22/2020 12:01 PM  [facility] [address] [place], [place] [address]  Phone [phone] Fax [phone]

## 2020-04-22 NOTE — Telephone Encounter (Signed)
Pt asking for a call from RN to clarify how she is to take her carbidopa-levodopa (SINEMET CR) 50-200 MG tablet

## 2020-04-23 ENCOUNTER — Telehealth: Payer: Self-pay | Admitting: Allergy and Immunology

## 2020-04-23 ENCOUNTER — Ambulatory Visit: Payer: 59 | Admitting: Allergy and Immunology

## 2020-04-23 NOTE — Telephone Encounter (Signed)
Pt request advice from Dr. Verlin Fester on which provider within the practice should handle her transfer of care. Requests a call back.

## 2020-04-23 NOTE — Telephone Encounter (Signed)
Please advise to message about which provider she needs to see

## 2020-04-29 MED FILL — OZEMPIC 0.25 OR 0.5 MG/DOSE: 2 | 44 days supply | Qty: 2 | Fill #2

## 2020-05-01 ENCOUNTER — Ambulatory Visit
Admission: RE | Admit: 2020-05-01 | Discharge: 2020-05-01 | Disposition: A | Discharge status: Home / Self Care | Payer: 59 | Source: Ambulatory Visit | Attending: Family Medicine | Admitting: Family Medicine

## 2020-05-01 ENCOUNTER — Other Ambulatory Visit: Payer: Self-pay

## 2020-05-01 DIAGNOSIS — Z1231 Encounter for screening mammogram for malignant neoplasm of breast: Secondary | ICD-10-CM

## 2020-05-04 ENCOUNTER — Encounter (INDEPENDENT_AMBULATORY_CARE_PROVIDER_SITE_OTHER): Payer: Self-pay | Admitting: Family Medicine

## 2020-05-04 ENCOUNTER — Ambulatory Visit (INDEPENDENT_AMBULATORY_CARE_PROVIDER_SITE_OTHER): Payer: 59 | Admitting: Family Medicine

## 2020-05-04 ENCOUNTER — Other Ambulatory Visit: Payer: Self-pay

## 2020-05-04 VITALS — BP 108/68 | HR 82 | Temp 97.7°F | Ht 66.0 in | Wt 223.0 lb

## 2020-05-04 DIAGNOSIS — E1122 Type 2 diabetes mellitus with diabetic chronic kidney disease: Secondary | ICD-10-CM | POA: Diagnosis not present

## 2020-05-04 DIAGNOSIS — Z6836 Body mass index (BMI) 36.0-36.9, adult: Secondary | ICD-10-CM | POA: Diagnosis not present

## 2020-05-04 DIAGNOSIS — N1832 Chronic kidney disease, stage 3b: Secondary | ICD-10-CM

## 2020-05-04 DIAGNOSIS — N183 Chronic kidney disease, stage 3 unspecified: Secondary | ICD-10-CM | POA: Diagnosis not present

## 2020-05-04 DIAGNOSIS — Z794 Long term (current) use of insulin: Secondary | ICD-10-CM

## 2020-05-05 ENCOUNTER — Encounter (INDEPENDENT_AMBULATORY_CARE_PROVIDER_SITE_OTHER): Payer: Self-pay | Admitting: Family Medicine

## 2020-05-05 NOTE — Progress Notes (Signed)
Chief Complaint:   OBESITY Deanna Rowe is here to discuss her progress with her obesity treatment plan along with follow-up of her obesity related diagnoses. Deanna Rowe is on the Category 3 Plan and states she is following her eating plan approximately 50% of the time. Deanna Rowe states she is doing 0 minutes 0 times per week.  Today's visit was #: 22 Starting weight: 237 lbs Starting date: 04/16/2019 Today's weight: 223 lbs Today's date: 05/04/2020 Total lbs lost to date: 14 Total lbs lost since last in-office visit: 5  Interim History: Deanna Rowe still feels she does not want to snack as much since starting Ozempic. She has had a steady weight reduction since being started on Ozempic in November. She is no longer skipping meals. She is still having cereal (Special K Protein) for breakfast, sandwich for lunch, and fish or chicken and green beans for dinner. She eats out twice per week, but she eats less when she eats out. She is only eating 1/2 sandwich and no yogurt or fruit for lunch.  Subjective:   1. Type 2 diabetes mellitus with stage 3b chronic kidney disease, with long-term current use of insulin (HCC) Deanna Rowe's A1c is not at goal.  Last A1c was 8.0 in November 2021 at Dr. Cindra Eves office. She is on insulin Glargine 10 units dailyand Humalog 14 breakfast, 12 lunch, 16 supper (meal coverage). Her fasting BGs range between 71 and 120, and 2 hour post prandial range in 130's. She denies hypoglycemia.  Lab Results  Component Value Date   HGBA1C 7.9 (H) 04/16/2019   HGBA1C 6.9 11/25/2016   HGBA1C 6.0 06/10/2016   Lab Results  Component Value Date   MICROALBUR <0.7 02/12/2016   LDLCALC 57 04/16/2019   CREATININE 1.43 (H) 03/02/2020   Lab Results  Component Value Date   INSULIN 12.3 04/16/2019   Assessment/Plan:   1. Type 2 diabetes mellitus with stage 3b chronic kidney disease, with long-term current use of insulin ([place]) . Deanna Rowe will continue her meal coverage insulin, Ozempic 0.25  mg, and Glargine.  2. Class 2 severe obesity with serious comorbidity and body mass index (BMI) of 36.0 to 36.9 in adult, unspecified obesity type (HCC) Deanna Rowe is currently in the action stage of change. As such, her goal is to continue with weight loss efforts. She has agreed to the Category 3 Plan.   Deanna Rowe is to have yogurt from her lunch at some time during the day.  Exercise goals: Chair exercises 2 times per week using videos.  Behavioral modification strategies: increasing lean protein intake.  Deanna Rowe has agreed to follow-up with our clinic in 3 weeks.   Objective:   Blood pressure 108/68, pulse 82, temperature 97.7 F (36.5 C), height 5\' 6"  (1.676 m), weight 223 lb (101.2 kg), SpO2 97 %. Body mass index is 35.99 kg/m.  General: Cooperative, alert, well developed, in no acute distress. HEENT: Conjunctivae and lids unremarkable. Cardiovascular: Regular rhythm.  Lungs: Normal work of breathing. Neurologic: No focal deficits.   Lab Results  Component Value Date   CREATININE 1.43 (H) 03/02/2020   BUN 13 03/02/2020   NA 136 03/02/2020   K 4.7 03/02/2020   CL 98 03/02/2020   CO2 27 03/02/2020   Lab Results  Component Value Date   ALT 21 04/16/2019   AST 23 04/16/2019   ALKPHOS 58 04/16/2019   BILITOT 0.3 04/16/2019   Lab Results  Component Value Date   HGBA1C 7.9 (H) 04/16/2019   HGBA1C 6.9 11/25/2016  HGBA1C 6.0 06/10/2016   HGBA1C 5.9 02/12/2016   HGBA1C 6.1 12/01/2015   Lab Results  Component Value Date   INSULIN 12.3 04/16/2019   Lab Results  Component Value Date   TSH 1.780 04/16/2019   Lab Results  Component Value Date   CHOL 155 04/16/2019   HDL 84 04/16/2019   LDLCALC 57 04/16/2019   TRIG 71 04/16/2019   Lab Results  Component Value Date   WBC 6.8 04/16/2019   HGB 12.3 04/16/2019   HCT 36.1 04/16/2019   MCV 96 04/16/2019   PLT 274 04/16/2019   No results found for: IRON, TIBC, FERRITIN  Obesity Behavioral Intervention:    Approximately 15 minutes were spent on the discussion below.  ASK: We discussed the diagnosis of obesity with Deanna Rowe today and Deanna Rowe agreed to give [name] permission to discuss obesity behavioral modification therapy today.  ASSESS: Khrystyne has the diagnosis of obesity and her BMI today is 36.01. Suellen is in the action stage of change.   ADVISE: Sabine was educated on the multiple health risks of obesity as well as the benefit of weight loss to improve her health. She was advised of the need for long term treatment and the importance of lifestyle modifications to improve her current health and to decrease her risk of future health problems.  AGREE: Multiple dietary modification options and treatment options were discussed and Pami agreed to follow the recommendations documented in the above note.  ARRANGE: Norah was educated on the importance of frequent visits to treat obesity as outlined per CMS and USPSTF guidelines and agreed to schedule her next follow up appointment today.  Attestation Statements:   Reviewed by clinician on day of visit: allergies, medications, problem list, medical history, surgical history, family history, social history, and previous encounter notes.   Wilhemena Durie, am acting as Location manager for [employer], FNP-C.  I have reviewed the above documentation for accuracy and completeness, and I agree with the above. - Georgianne Fick, FNP

## 2020-05-05 NOTE — Telephone Encounter (Signed)
I called the patient and answered her questions. Thanks.

## 2020-05-13 DIAGNOSIS — I129 Hypertensive chronic kidney disease with stage 1 through stage 4 chronic kidney disease, or unspecified chronic kidney disease: Secondary | ICD-10-CM | POA: Diagnosis not present

## 2020-05-13 DIAGNOSIS — N1832 Chronic kidney disease, stage 3b: Secondary | ICD-10-CM | POA: Diagnosis not present

## 2020-05-13 DIAGNOSIS — E559 Vitamin D deficiency, unspecified: Secondary | ICD-10-CM | POA: Diagnosis not present

## 2020-05-13 DIAGNOSIS — D631 Anemia in chronic kidney disease: Secondary | ICD-10-CM | POA: Diagnosis not present

## 2020-05-13 MED FILL — XOLAIR 150 MG SOLR: 150 | 28 days supply | Qty: 2 | Fill #5

## 2020-05-15 ENCOUNTER — Ambulatory Visit (INDEPENDENT_AMBULATORY_CARE_PROVIDER_SITE_OTHER): Payer: 59

## 2020-05-15 ENCOUNTER — Other Ambulatory Visit: Payer: Self-pay

## 2020-05-15 DIAGNOSIS — J4540 Moderate persistent asthma, uncomplicated: Secondary | ICD-10-CM | POA: Diagnosis not present

## 2020-05-15 MED FILL — TRINTELLIX 20 MG TABLET: 20 | 90 days supply | Qty: 90 | Fill #0

## 2020-05-18 MED FILL — SOLIFENACIN SUCCINATE 10 MG: 10 | 30 days supply | Qty: 30 | Fill #11

## 2020-05-18 MED FILL — MYRBETRIQ ER 50 MG TABLET: 50 | 30 days supply | Qty: 30 | Fill #9

## 2020-05-18 MED FILL — DEXLANSOPRAZOLE 60 MG CPDR: 60 | 30 days supply | Qty: 30 | Fill #4

## 2020-05-18 MED FILL — MONTELUKAST SOD 10 MG TAB: 10 | 30 days supply | Qty: 30 | Fill #2

## 2020-05-18 MED FILL — LEVOCETIRIZINE 5 MG TABLET: 5 | 30 days supply | Qty: 30 | Fill #1

## 2020-05-18 MED FILL — FAMOTIDINE 20 MG TABLET: 20 | 30 days supply | Qty: 60 | Fill #2

## 2020-05-19 ENCOUNTER — Other Ambulatory Visit: Payer: Self-pay | Admitting: Interventional Cardiology

## 2020-05-19 DIAGNOSIS — G2581 Restless legs syndrome: Secondary | ICD-10-CM | POA: Diagnosis not present

## 2020-05-19 DIAGNOSIS — E1122 Type 2 diabetes mellitus with diabetic chronic kidney disease: Secondary | ICD-10-CM | POA: Diagnosis not present

## 2020-05-19 DIAGNOSIS — N3941 Urge incontinence: Secondary | ICD-10-CM | POA: Diagnosis not present

## 2020-05-19 DIAGNOSIS — K219 Gastro-esophageal reflux disease without esophagitis: Secondary | ICD-10-CM | POA: Diagnosis not present

## 2020-05-19 DIAGNOSIS — F339 Major depressive disorder, recurrent, unspecified: Secondary | ICD-10-CM | POA: Diagnosis not present

## 2020-05-19 DIAGNOSIS — N183 Chronic kidney disease, stage 3 unspecified: Secondary | ICD-10-CM | POA: Diagnosis not present

## 2020-05-19 DIAGNOSIS — I509 Heart failure, unspecified: Secondary | ICD-10-CM | POA: Diagnosis not present

## 2020-05-19 DIAGNOSIS — G20 Parkinson's disease: Secondary | ICD-10-CM | POA: Diagnosis not present

## 2020-05-19 DIAGNOSIS — E7800 Pure hypercholesterolemia, unspecified: Secondary | ICD-10-CM | POA: Diagnosis not present

## 2020-05-19 DIAGNOSIS — I10 Essential (primary) hypertension: Secondary | ICD-10-CM | POA: Diagnosis not present

## 2020-05-19 DIAGNOSIS — E042 Nontoxic multinodular goiter: Secondary | ICD-10-CM | POA: Diagnosis not present

## 2020-05-19 DIAGNOSIS — J4540 Moderate persistent asthma, uncomplicated: Secondary | ICD-10-CM | POA: Diagnosis not present

## 2020-05-19 MED FILL — LOSARTAN-HCTZ 100-12.5 MG T: 100-12.5 | 90 days supply | Qty: 90 | Fill #1

## 2020-05-25 ENCOUNTER — Ambulatory Visit (INDEPENDENT_AMBULATORY_CARE_PROVIDER_SITE_OTHER): Payer: 59 | Admitting: Family Medicine

## 2020-05-26 DIAGNOSIS — E042 Nontoxic multinodular goiter: Secondary | ICD-10-CM | POA: Diagnosis not present

## 2020-05-26 DIAGNOSIS — N1832 Chronic kidney disease, stage 3b: Secondary | ICD-10-CM | POA: Diagnosis not present

## 2020-05-26 DIAGNOSIS — I700 Atherosclerosis of aorta: Secondary | ICD-10-CM | POA: Diagnosis not present

## 2020-05-26 DIAGNOSIS — E1151 Type 2 diabetes mellitus with diabetic peripheral angiopathy without gangrene: Secondary | ICD-10-CM | POA: Diagnosis not present

## 2020-05-27 MED FILL — OZEMPIC 0.25 OR 0.5 MG/DOSE: 2 | 28 days supply | Qty: 2 | Fill #3

## 2020-05-28 ENCOUNTER — Other Ambulatory Visit (HOSPITAL_BASED_OUTPATIENT_CLINIC_OR_DEPARTMENT_OTHER): Payer: Self-pay | Admitting: Internal Medicine

## 2020-05-28 MED FILL — FREESTYLE LITE TEST STRIP: 75 days supply | Qty: 300 | Fill #0

## 2020-05-29 ENCOUNTER — Other Ambulatory Visit (HOSPITAL_BASED_OUTPATIENT_CLINIC_OR_DEPARTMENT_OTHER): Payer: Self-pay | Admitting: Internal Medicine

## 2020-05-29 MED FILL — FREESTYLE LANCETS: 90 days supply | Qty: 400 | Fill #0

## 2020-06-03 ENCOUNTER — Ambulatory Visit (INDEPENDENT_AMBULATORY_CARE_PROVIDER_SITE_OTHER): Payer: 59 | Admitting: Family Medicine

## 2020-06-03 ENCOUNTER — Encounter (INDEPENDENT_AMBULATORY_CARE_PROVIDER_SITE_OTHER): Payer: Self-pay | Admitting: Family Medicine

## 2020-06-03 ENCOUNTER — Ambulatory Visit (INDEPENDENT_AMBULATORY_CARE_PROVIDER_SITE_OTHER): Payer: 59 | Admitting: Adult Health

## 2020-06-03 ENCOUNTER — Encounter: Payer: Self-pay | Admitting: Adult Health

## 2020-06-03 ENCOUNTER — Other Ambulatory Visit: Payer: Self-pay

## 2020-06-03 VITALS — BP 101/61 | HR 82 | Temp 98.0°F | Ht 66.0 in | Wt 222.0 lb

## 2020-06-03 VITALS — BP 105/68 | HR 86 | Ht 66.0 in | Wt 226.0 lb

## 2020-06-03 DIAGNOSIS — Z9989 Dependence on other enabling machines and devices: Secondary | ICD-10-CM

## 2020-06-03 DIAGNOSIS — R5383 Other fatigue: Secondary | ICD-10-CM

## 2020-06-03 DIAGNOSIS — N1832 Chronic kidney disease, stage 3b: Secondary | ICD-10-CM

## 2020-06-03 DIAGNOSIS — Z794 Long term (current) use of insulin: Secondary | ICD-10-CM | POA: Diagnosis not present

## 2020-06-03 DIAGNOSIS — Z6835 Body mass index (BMI) 35.0-35.9, adult: Secondary | ICD-10-CM | POA: Diagnosis not present

## 2020-06-03 DIAGNOSIS — G4733 Obstructive sleep apnea (adult) (pediatric): Secondary | ICD-10-CM

## 2020-06-03 DIAGNOSIS — E1122 Type 2 diabetes mellitus with diabetic chronic kidney disease: Secondary | ICD-10-CM | POA: Diagnosis not present

## 2020-06-03 NOTE — Progress Notes (Signed)
I agree with the assessment and plan as directed by NP on this visit . I was available for consultation.   DOHMEIER,CARMEN, MD  

## 2020-06-03 NOTE — Progress Notes (Signed)
PATIENT: Deanna Rowe DOB: 01/31/1956  REASON FOR VISIT: follow up HISTORY FROM: patient  HISTORY OF PRESENT ILLNESS: Today 06/03/20:  Deanna Rowe is a 65 year old female with a history of obstructive sleep apnea on CPAP.  She reports that she has not been using the CPAP consistently because it makes her cough.  She does not like the nasal pillows better.  Her husband reports that she sleeps throughout the day.  Does not use a CPAP during the day.  She does have the desire to use the CPAP.  She returns today for an evaluation.    HISTORY   REVIEW OF SYSTEMS: Out of a complete 14 system review of symptoms, the patient complains only of the following symptoms, and all other reviewed systems are negative.  See HPI  ALLERGIES: Allergies  Allergen Reactions  . Fluticasone-Salmeterol Anaphylaxis  . Codeine Nausea And Vomiting  . Fetzima [Levomilnacipran] Nausea And Vomiting  . Nsaids Other (See Comments)    Upset stomach   . Trulicity [Dulaglutide] Nausea And Vomiting    Significant and undesirably rapid (40 lbs) weight loss   . Xanax [Alprazolam] Other (See Comments)    disoriented  . Amoxicillin Rash    Has patient had a PCN reaction causing immediate rash, facial/tongue/throat swelling, SOB or lightheadedness with hypotension: Yes Has patient had a PCN reaction causing severe rash involving mucus membranes or skin necrosis: No Has patient had a PCN reaction that required hospitalization: No Has patient had a PCN reaction occurring within the last 10 years: No If all of the above answers are "NO", then may proceed with Cephalosporin use.   . Pseudoephedrine Hcl Er Palpitations    HOME MEDICATIONS: Outpatient Medications Prior to Visit  Medication Sig Dispense Refill  . ACCU-CHEK FASTCLIX LANCETS MISC USE TO CHECK BLOOD SUGAR 3 TIMES A DAY 102 each 0  . ACCU-CHEK GUIDE test strip USE TO CHECK BLOOD SUGAR 3 TIMES PER DAY. (Patient taking differently: Use to check blood  sugar 4 times per day.) 100 each 2  . albuterol (PROVENTIL HFA;VENTOLIN HFA) 108 (90 Base) MCG/ACT inhaler Inhale 2 puffs into the lungs as needed for wheezing or shortness of breath. 18 g 2  . amLODipine (NORVASC) 5 MG tablet Take 5 mg by mouth every morning.     Marland Kitchen aspirin EC 81 MG tablet Take 81 mg by mouth every evening.    Marland Kitchen atorvastatin (LIPITOR) 10 MG tablet Take 10 mg by mouth at bedtime.  4  . azelastine (ASTELIN) 0.1 % nasal spray Place 2 sprays into both nostrils 2 (two) times daily. Use in each nostril as directed 30 mL 5  . Brexpiprazole (REXULTI) 2 MG TABS Take 2 mg by mouth daily. (Patient taking differently: Take 1 mg by mouth daily.) 30 tablet   . budesonide-formoterol (SYMBICORT) 160-4.5 MCG/ACT inhaler 2 puffs twice a day with spacer 1 Inhaler 5  . buPROPion (WELLBUTRIN XL) 150 MG 24 hr tablet Take 450 mg by mouth every morning.     . Calcium Carbonate-Vitamin D 600-400 MG-UNIT tablet Take 1 tablet by mouth daily.    . carbidopa-levodopa (SINEMET CR) 50-200 MG tablet Take 1 tablet by mouth 5 (five) times daily. 450 tablet 1  . carbidopa-levodopa (SINEMET) 25-250 MG tablet Take 1 tablet by mouth in the morning. 90 tablet 1  . dexlansoprazole (DEXILANT) 60 MG capsule Take 1 capsule (60 mg total) by mouth daily. 30 capsule 5  . EPINEPHrine (EPIPEN 2-PAK) 0.3 mg/0.3 mL IJ SOAJ  injection Inject 0.3 mg into the muscle as needed for anaphylaxis. 2 each 1  . famotidine (PEPCID) 20 MG tablet TAKE 1 TABLET (20 MG TOTAL) BY MOUTH 2 (TWO) TIMES DAILY. 60 tablet 2  . fluticasone (FLONASE) 50 MCG/ACT nasal spray Place 2 sprays into both nostrils daily. 1 g 5  . furosemide (LASIX) 20 MG tablet Take 20 mg by mouth every other day.    . gabapentin (NEURONTIN) 300 MG capsule Take 300 mg by mouth 2 (two) times daily.     . hydrochlorothiazide (HYDRODIURIL) 12.5 MG tablet Take 12.5 mg by mouth daily.     . Insulin Glargine (BASAGLAR KWIKPEN) 100 UNIT/ML Inject 0.4 mLs (40 Units total) into the skin  daily. 5 pen 0  . insulin lispro (HUMALOG) 100 UNIT/ML injection SQ - 14 units at breakfast, 12 units at lunch, 16 units at supper 10 mL 0  . Lancets (FREESTYLE) lancets     . levocetirizine (XYZAL) 5 MG tablet TAKE 1 TABLET BY MOUTH EVERY EVENING 30 tablet 1  . LORazepam (ATIVAN) 2 MG tablet Take 2 mg by mouth 3 (three) times daily as needed.    Marland Kitchen losartan-hydrochlorothiazide (HYZAAR) 100-12.5 MG tablet Take 1 tablet by mouth daily.    . mirabegron ER (MYRBETRIQ) 50 MG TB24 tablet Take 50 mg by mouth daily.    . montelukast (SINGULAIR) 10 MG tablet TAKE 1 TABLET (10 MG TOTAL) BY MOUTH AT BEDTIME. 30 tablet 2  . Olopatadine HCl (PATADAY) 0.2 % SOLN Place 1 drop into both eyes daily as needed. 2.5 mL 5  . omalizumab (XOLAIR) 150 MG injection Inject 300 mg into the skin every 28 (twenty-eight) days. Dx J45.40- PA [id number] 12/09/19-12/04/20- copay card ID [id number] BIN [id number], Payer [id number] 2 each 11  . propranolol (INDERAL) 40 MG tablet TAKE 1 TABLET BY MOUTH TWICE DAILY 180 tablet 2  . rOPINIRole (REQUIP) 2 MG tablet Take 2 mg by mouth 2 (two) times daily.  3  . Semaglutide,0.25 or 0.5MG /DOS, (OZEMPIC, 0.25 OR 0.5 MG/DOSE,) 2 MG/1.5ML SOPN Inject 0.25 mg into the skin once a week.    . solifenacin (VESICARE) 10 MG tablet Take 10 mg by mouth daily.    . valACYclovir (VALTREX) 500 MG tablet Take 500 mg by mouth daily.    . vitamin C (ASCORBIC ACID) 500 MG tablet Take 500 mg by mouth daily.    . Vitamin D, Ergocalciferol, 2000 units CAPS Take 1 capsule by mouth daily. 2000 units daily.    Marland Kitchen vortioxetine HBr (TRINTELLIX) 20 MG TABS tablet Take 20 mg by mouth daily.     Marland Kitchen zolpidem (AMBIEN) 5 MG tablet Take 5 mg by mouth at bedtime.      Facility-Administered Medications Prior to Visit  Medication Dose Route Frequency Provider Last Rate Last Admin  . omalizumab Arvid Right) injection 300 mg  300 mg Subcutaneous Q28 days Bobbitt, [place] Muta, MD   300 mg at 05/15/20 1406  . tranexamic acid (CYKLOKAPRON)  topical -INTRAOP  2,000 mg Topical Once Deanna Jourdain, PA-C        PAST MEDICAL HISTORY: Past Medical History:  Diagnosis Date  . Anemia   . Anxiety   . Arthritis    L HIP  . Asthma   . Avascular necrosis of hip (HCC)    LEFT  . Back pain   . Chest pain   . Constipation   . Depression   . Diabetes mellitus   . Diabetes mellitus, type II ([place])   .  Dry cough   . Edema, lower extremity   . Gait abnormality 11/18/2019  . GERD (gastroesophageal reflux disease)   . High cholesterol   . Hypertension   . Insomnia    takes Ambien nightly  . Joint pain   . Kidney disease   . Kidney disease   . Obesity   . Parkinsonism ([place]) 03/02/2017  . PONV (postoperative nausea and vomiting)   . RLS (restless legs syndrome) 01/15/2018  . Sleep apnea   . Swallowing difficulty   . Thyroid disease   . TIA (transient ischemic attack) 2012   no residual problems  . Urgency incontinence   . Vitamin D deficiency     PAST SURGICAL HISTORY: Past Surgical History:  Procedure Laterality Date  . DILATION AND CURETTAGE OF UTERUS    . ESOPHAGEAL MANOMETRY N/A 09/03/2012   Procedure: ESOPHAGEAL MANOMETRY (EM);  Surgeon: Garlan Fair, MD;  Location: [facility];  Service: Endoscopy;  Laterality: N/A;  . ESOPHAGEAL MANOMETRY N/A 06/19/2017   Procedure: ESOPHAGEAL MANOMETRY (EM);  Surgeon: Clarene Essex, MD;  Location: [facility];  Service: Endoscopy;  Laterality: N/A;  . EXPLORATORY LAPAROTOMY    . GANGLION CYST EXCISION    . JOINT REPLACEMENT  2011   rt total hip  . KNEE ARTHROSCOPY    . PILONIDAL CYST / SINUS EXCISION    . TOTAL HIP ARTHROPLASTY Left 10/09/2013   Procedure: LEFT TOTAL HIP ARTHROPLASTY ANTERIOR APPROACH;  Surgeon: [name], MD;  Location: [facility];  Service: Orthopedics;  Laterality: Left;    FAMILY HISTORY: Family History  Problem Relation Age of Onset  . Alcohol abuse Brother   . Alcohol abuse Brother   . Alcohol abuse Brother   . Alcohol abuse Brother   . High  blood pressure Mother   . Hyperlipidemia Mother   . Heart disease Mother   . Heart disease Father   . Alcoholism Father   . Allergic rhinitis Sister   . Diabetes Neg Hx   . Angioedema Neg Hx   . Asthma Neg Hx   . Eczema Neg Hx   . Immunodeficiency Neg Hx   . Urticaria Neg Hx   . Breast cancer Neg Hx     SOCIAL HISTORY: Social History   Socioeconomic History  . Marital status: Married    Spouse name: Simona Huh  . Number of children: Not on file  . Years of education: Not on file  . Highest education level: Not on file  Occupational History  . Occupation: retired Therapist, sports  Tobacco Use  . Smoking status: Never Smoker  . Smokeless tobacco: Never Used  Vaping Use  . Vaping Use: Never used  Substance and Sexual Activity  . Alcohol use: No    Alcohol/week: 0.0 standard drinks  . Drug use: No  . Sexual activity: Not Currently  Other Topics Concern  . Not on file  Social History Narrative   Lives with husband   Caffeine use: none   Right handed    Social Determinants of Health   Financial Resource Strain: Not on file  Food Insecurity: Not on file  Transportation Needs: Not on file  Physical Activity: Not on file  Stress: Not on file  Social Connections: Not on file  Intimate Partner Violence: Not on file      PHYSICAL EXAM  Vitals:   06/03/20 1007  BP: 105/68  Pulse: 86  Weight: 226 lb (102.5 kg)  Height: 5\' 6"  (1.676 m)   Body mass index  is 36.48 kg/m.  Generalized: Well developed, in no acute distress  Chest: Lungs clear to auscultation bilaterally  Neurological examination  Mentation: Alert oriented to time, place, history taking. Follows all commands speech and language fluent Cranial nerve II-XII: Extraocular movements were full, visual field were full on confrontational test Head turning and shoulder shrug  were normal and symmetric. Motor: The motor testing reveals 5 over 5 strength of all 4 extremities. Good symmetric motor tone is noted throughout.   Sensory: Sensory testing is intact to soft touch on all 4 extremities. No evidence of extinction is noted.  Gait and station: Gait is normal.    DIAGNOSTIC DATA (LABS, IMAGING, TESTING) - I reviewed patient records, labs, notes, testing and imaging myself where available.  Lab Results  Component Value Date   WBC 6.8 04/16/2019   HGB 12.3 04/16/2019   HCT 36.1 04/16/2019   MCV 96 04/16/2019   PLT 274 04/16/2019      Component Value Date/Time   NA 136 03/02/2020 0920   NA 142 01/12/2017 0914   K 4.7 03/02/2020 0920   K 4.2 01/12/2017 0914   CL 98 03/02/2020 0920   CO2 27 03/02/2020 0920   CO2 28 01/12/2017 0914   GLUCOSE 139 (H) 03/02/2020 0920   GLUCOSE 166 (H) 10/12/2018 1121   GLUCOSE 142 (H) 01/12/2017 0914   BUN 13 03/02/2020 0920   BUN 12.0 01/12/2017 0914   CREATININE 1.43 (H) 03/02/2020 0920   CREATININE 1.58 (H) 10/12/2018 1121   CREATININE 1.3 (H) 01/12/2017 0914   CALCIUM 9.4 03/02/2020 0920   CALCIUM 9.6 01/12/2017 0914   PROT 7.0 04/16/2019 1451   PROT 7.2 01/12/2017 0914   ALBUMIN 4.2 04/16/2019 1451   ALBUMIN 3.5 01/12/2017 0914   AST 23 04/16/2019 1451   AST 16 10/12/2018 1121   AST 19 01/12/2017 0914   ALT 21 04/16/2019 1451   ALT <6 10/12/2018 1121   ALT 18 01/12/2017 0914   ALKPHOS 58 04/16/2019 1451   ALKPHOS 43 01/12/2017 0914   BILITOT 0.3 04/16/2019 1451   BILITOT 0.2 (L) 10/12/2018 1121   BILITOT 0.28 01/12/2017 0914   GFRNONAA 38 (L) 03/02/2020 0920   GFRNONAA 34 (L) 10/12/2018 1121   GFRAA 44 (L) 03/02/2020 0920   GFRAA 40 (L) 10/12/2018 1121   Lab Results  Component Value Date   CHOL 155 04/16/2019   HDL 84 04/16/2019   LDLCALC 57 04/16/2019   TRIG 71 04/16/2019   Lab Results  Component Value Date   HGBA1C 7.9 (H) 04/16/2019   Lab Results  Component Value Date   VITAMINB12 305 04/16/2019   Lab Results  Component Value Date   TSH 1.780 04/16/2019      ASSESSMENT AND PLAN 65 y.o. year old female  has a past medical  history of Anemia, Anxiety, Arthritis, Asthma, Avascular necrosis of hip ([place]), Back pain, Chest pain, Constipation, Depression, Diabetes mellitus, Diabetes mellitus, type II ([place]), Dry cough, Edema, lower extremity, Gait abnormality (11/18/2019), GERD (gastroesophageal reflux disease), High cholesterol, Hypertension, Insomnia, Joint pain, Kidney disease, Kidney disease, Obesity, Parkinsonism ([place]) (03/02/2017), PONV (postoperative nausea and vomiting), RLS (restless legs syndrome) (01/15/2018), Sleep apnea, Swallowing difficulty, Thyroid disease, TIA (transient ischemic attack) (2012), Urgency incontinence, and Vitamin D deficiency. here with:  1. OSA on CPAP  -Noncompliant -I adjusted her humidity--decrease down to 2 - Encourage patient to use CPAP nightly and > 4 hours each night -Advised to send me a MyChart message and let me know  how she is doing may need to make further adjustments.  Also encourage patient to try to use the CPAP during the day while she is awake to help her get used to it. - F/U in 1 year or sooner if needed   I spent 25 minutes of face-to-face and non-face-to-face time with patient.  This included previsit chart review, lab review, study review, order entry, electronic health record documentation, patient education.  Ward Givens, MSN, NP-C 06/03/2020, 10:38 AM [facility] [address], [place], [place] [address] [phone]

## 2020-06-03 NOTE — Patient Instructions (Signed)
Continue using CPAP nightly and greater than 4 hours each night  I have adjusted humidity  If your symptoms worsen or you develop new symptoms please let us know.

## 2020-06-04 ENCOUNTER — Encounter (INDEPENDENT_AMBULATORY_CARE_PROVIDER_SITE_OTHER): Payer: Self-pay | Admitting: Family Medicine

## 2020-06-04 NOTE — Progress Notes (Signed)
Chief Complaint:   OBESITY Deanna Rowe is here to discuss her progress with her obesity treatment plan along with follow-up of her obesity related diagnoses. Deanna Rowe is on the Category 3 Plan and states she is following her eating plan approximately 25% of the time. Deanna Rowe states she is doing 0 minutes 0 times per week.  Today's visit was #: 23 Starting weight: 237 lbs Starting date: 04/16/2019 Today's weight: 222 lbs Today's date: 06/03/2020 Total lbs lost to date: 15 Total lbs lost since last in-office visit: 1 lb  Interim History: Azayla feels that her appetite is suppressed due to Ozempic and she still is not snacking at night. She is eating the cereal option at breakfast and [place] sandwich at lunch. She and her husband eat out 2 nights per week and cook at home the other 5 nights.  Subjective:   1. Other fatigue Deanna Rowe still notes extreme fatigue and daytime sleepiness. She gets about 4 hours of sleep per night. She gets up to go the bathroom several times. Her TSH and Hgb and Hct are within normal limits.  Her PCP suggested a nap every day using her CPAP.  2. Type 2 diabetes mellitus with stage 3b chronic kidney disease, with long-term current use of insulin (HCC) Deanna Rowe's A1c is now down to 7.0 from 8.0. Her fasting blood glucose is in the low 100's and 2 hours post prandial 130's-140's. She denies hypoglycemia. Diabetes management is by Dr. Buddy Duty. She is on insulin: Humalog 14, 12, 16 units and Glargine 10 units nightly (recently reduced from 40 by endo) and Ozempic 0.25 mg. She is tolerating the Ozempic well.   Assessment/Plan:   1. Other fatigue Deanna Rowe has been advised to take a nap during the day with CPAP.  2. Type 2 diabetes mellitus with stage 3b chronic kidney disease, with long-term current use of insulin (HCC)  Continue all meds. Follow up with Dr. Buddy Duty in 3 months.  3. Class 2 severe obesity with serious comorbidity and body mass index (BMI) of 35.0 to 35.9 in  adult, unspecified obesity type (HCC) Deanna Rowe is currently in the action stage of change. As such, her goal is to continue with weight loss efforts. She has agreed to the Category 3 Plan.   Exercise goals: No exercise has been prescribed at this time.  Behavioral modification strategies: planning for success.  Deanna Rowe has agreed to follow-up with our clinic in 3 weeks.   Objective:   Blood pressure 101/61, pulse 82, temperature 98 F (36.7 C), temperature source Oral, height 5\' 6"  (1.676 m), weight 222 lb (100.7 kg), SpO2 93 %. Body mass index is 35.83 kg/m.  General: Cooperative, alert, well developed, in no acute distress. HEENT: Conjunctivae and lids unremarkable. Cardiovascular: Regular rhythm.  Lungs: Normal work of breathing. Neurologic: No focal deficits.   Lab Results  Component Value Date   CREATININE 1.43 (H) 03/02/2020   BUN 13 03/02/2020   NA 136 03/02/2020   K 4.7 03/02/2020   CL 98 03/02/2020   CO2 27 03/02/2020   Lab Results  Component Value Date   ALT 21 04/16/2019   AST 23 04/16/2019   ALKPHOS 58 04/16/2019   BILITOT 0.3 04/16/2019   Lab Results  Component Value Date   HGBA1C 7.9 (H) 04/16/2019   HGBA1C 6.9 11/25/2016   HGBA1C 6.0 06/10/2016   HGBA1C 5.9 02/12/2016   HGBA1C 6.1 12/01/2015   Lab Results  Component Value Date   INSULIN 12.3 04/16/2019   Lab  Results  Component Value Date   TSH 1.780 04/16/2019   Lab Results  Component Value Date   CHOL 155 04/16/2019   HDL 84 04/16/2019   LDLCALC 57 04/16/2019   TRIG 71 04/16/2019   Lab Results  Component Value Date   WBC 6.8 04/16/2019   HGB 12.3 04/16/2019   HCT 36.1 04/16/2019   MCV 96 04/16/2019   PLT 274 04/16/2019    Attestation Statements:   Reviewed by clinician on day of visit: allergies, medications, problem list, medical history, surgical history, family history, social history, and previous encounter notes.  Deanna Rowe, am acting as Location manager for [employer], [place].  I have reviewed the above documentation for accuracy and completeness, and I agree with the above. -  Deanna Fick, FNP

## 2020-06-10 ENCOUNTER — Other Ambulatory Visit (HOSPITAL_BASED_OUTPATIENT_CLINIC_OR_DEPARTMENT_OTHER): Payer: Self-pay | Admitting: Family Medicine

## 2020-06-10 ENCOUNTER — Other Ambulatory Visit: Payer: Self-pay

## 2020-06-10 MED ORDER — FLUTICASONE PROPIONATE 50 MCG/ACT NA SUSP: 2.0000 | Freq: Every day | NASAL | 0 refills | Status: DC

## 2020-06-10 MED ORDER — MONTELUKAST SODIUM 10 MG PO TABS
10.0000 mg | ORAL_TABLET | Freq: Every day | ORAL | 0 refills | Status: DC
Start: 2020-06-10 — End: 2020-06-10

## 2020-06-10 MED ORDER — LEVOCETIRIZINE DIHYDROCHLORIDE 5 MG PO TABS
5.0000 mg | ORAL_TABLET | Freq: Every evening | ORAL | 0 refills | Status: DC
Start: 2020-06-10 — End: 2020-06-10

## 2020-06-10 MED FILL — LORazepam 2 MG TABS: 2 | 90 days supply | Qty: 270 | Fill #0

## 2020-06-10 MED FILL — XOLAIR 150 MG SOLR: 150 | 28 days supply | Qty: 2 | Fill #6

## 2020-06-10 MED FILL — GABAPENTIN 300 MG CAPSULE: 300 | 90 days supply | Qty: 180 | Fill #0

## 2020-06-10 MED FILL — rOPINIRole HCL 2 MG TABS: 2 | 90 days supply | Qty: 180 | Fill #1

## 2020-06-10 MED FILL — FLUTICASONE PROP 50 MCG SPR: 50 | 30 days supply | Qty: 16 | Fill #0

## 2020-06-10 MED FILL — PROPRANOLOL 40 MG TABLET: 40 | 90 days supply | Qty: 180 | Fill #0

## 2020-06-10 NOTE — Telephone Encounter (Signed)
Courtesy refill for famotidine 20 mg, montelukast 10 mg, and levocetirizine 5 mg x 1 with no refills sent to [facility]. Spoke with patient and OV made for 06/30/20 at 10:30.

## 2020-06-11 MED FILL — MYRBETRIQ ER 50 MG TABLET: 50 | 30 days supply | Qty: 30 | Fill #10

## 2020-06-11 MED FILL — DEXLANSOPRAZOLE 60 MG CPDR: 60 | 30 days supply | Qty: 30 | Fill #5

## 2020-06-11 MED FILL — MONTELUKAST SOD 10 MG TAB: 10 | 30 days supply | Qty: 30 | Fill #0

## 2020-06-11 MED FILL — LEVOCETIRIZINE 5 MG TABLET: 5 | 30 days supply | Qty: 30 | Fill #0

## 2020-06-11 MED FILL — SOLIFENACIN SUCCINATE 10 MG: 10 | 30 days supply | Qty: 30 | Fill #0

## 2020-06-12 ENCOUNTER — Ambulatory Visit (INDEPENDENT_AMBULATORY_CARE_PROVIDER_SITE_OTHER): Payer: 59

## 2020-06-12 ENCOUNTER — Other Ambulatory Visit: Payer: Self-pay

## 2020-06-12 DIAGNOSIS — J4540 Moderate persistent asthma, uncomplicated: Secondary | ICD-10-CM | POA: Diagnosis not present

## 2020-06-18 ENCOUNTER — Other Ambulatory Visit (HOSPITAL_BASED_OUTPATIENT_CLINIC_OR_DEPARTMENT_OTHER): Payer: Self-pay | Admitting: Internal Medicine

## 2020-06-22 ENCOUNTER — Other Ambulatory Visit (HOSPITAL_BASED_OUTPATIENT_CLINIC_OR_DEPARTMENT_OTHER): Payer: Self-pay | Admitting: Internal Medicine

## 2020-06-22 ENCOUNTER — Encounter (INDEPENDENT_AMBULATORY_CARE_PROVIDER_SITE_OTHER): Payer: Self-pay | Admitting: Family Medicine

## 2020-06-22 ENCOUNTER — Other Ambulatory Visit: Payer: Self-pay

## 2020-06-22 ENCOUNTER — Ambulatory Visit (INDEPENDENT_AMBULATORY_CARE_PROVIDER_SITE_OTHER): Payer: 59 | Admitting: Family Medicine

## 2020-06-22 VITALS — BP 101/62 | HR 64 | Temp 97.5°F | Ht 66.0 in | Wt 220.0 lb

## 2020-06-22 DIAGNOSIS — G20A1 Parkinson's disease without dyskinesia, without mention of fluctuations: Secondary | ICD-10-CM

## 2020-06-22 DIAGNOSIS — Z6839 Body mass index (BMI) 39.0-39.9, adult: Secondary | ICD-10-CM

## 2020-06-22 DIAGNOSIS — S7001XA Contusion of right hip, initial encounter: Secondary | ICD-10-CM | POA: Diagnosis not present

## 2020-06-22 DIAGNOSIS — E1122 Type 2 diabetes mellitus with diabetic chronic kidney disease: Secondary | ICD-10-CM | POA: Diagnosis not present

## 2020-06-22 DIAGNOSIS — N1832 Chronic kidney disease, stage 3b: Secondary | ICD-10-CM | POA: Diagnosis not present

## 2020-06-22 DIAGNOSIS — G20 Parkinson's disease: Secondary | ICD-10-CM | POA: Diagnosis not present

## 2020-06-22 DIAGNOSIS — Z794 Long term (current) use of insulin: Secondary | ICD-10-CM

## 2020-06-22 MED FILL — ULTICARE PEN NDL 4MM 32G: 32G X 4 MM | 90 days supply | Qty: 100 | Fill #0

## 2020-06-23 ENCOUNTER — Other Ambulatory Visit (HOSPITAL_BASED_OUTPATIENT_CLINIC_OR_DEPARTMENT_OTHER): Payer: Self-pay | Admitting: Internal Medicine

## 2020-06-23 NOTE — Progress Notes (Signed)
Chief Complaint:   OBESITY Deanna Rowe is here to discuss her progress with her obesity treatment plan along with follow-up of her obesity related diagnoses. Deanna Rowe is on the Category 3 Plan and states she is following her eating plan approximately 50% of the time. Deanna Rowe states she is doing 0 minutes 0 times per week.  Today's visit was #: 24 Starting weight: 237 lbs Starting date: 04/16/2019 Today's weight: 220 lbs Today's date: 06/22/2020 Total lbs lost to date: 17 Total lbs lost since last in-office visit: 2  Interim History: Deanna Rowe notes they have decreased eating out 2 times per week. She is no longer snacking at night. She has special K cereal at breakfast and a sandwich at lunch. She has been using a walker due to increased falls recently.  Subjective:   1. Type 2 diabetes mellitus with stage 3b chronic kidney disease, with long-term current use of insulin (HCC) Deanna Rowe's last A1c was 7.0 at Dr. Cindra Eves office. Her fasting BGs range between 108 and 112, and 2 hour post prandial range in the 130's. She denies hypoglycemia. She is on Humalog for meal coverage and Basal insulin.  Lab Results  Component Value Date   HGBA1C 7.9 (H) 04/16/2019   HGBA1C 6.9 11/25/2016   HGBA1C 6.0 06/10/2016   Lab Results  Component Value Date   MICROALBUR <0.7 02/12/2016   LDLCALC 57 04/16/2019   CREATININE 1.43 (H) 03/02/2020   Lab Results  Component Value Date   INSULIN 12.3 04/16/2019   2. Parkinson disease ([place]) Deanna Rowe has been falling quite frequently (6 times over the past 2 weeks). She is compliant with Sinemet. She is now using a roller walker at her husband's insistence.   Assessment/Plan:   1. Type 2 diabetes mellitus with stage 3b chronic kidney disease, with long-term current use of insulin (HCC) Good blood sugar control is important to decrease the likelihood of diabetic complications such as nephropathy, neuropathy, limb loss, blindness, coronary artery disease, and  death. Intensive lifestyle modification including diet, exercise and weight loss are the first line of treatment for diabetes. Deanna Rowe will continue to follow up with Dr. Buddy Duty.  2. Parkinson disease ([place]) Deanna Rowe will call Dr. Jannifer Franklin to move her appointment, as she has an appointment for May with Dr. Jannifer Franklin currently.  3. Class 2 severe obesity with serious comorbidity and body mass index (BMI) of 35.0 to 35.9 in adult, unspecified obesity type (HCC) Deanna Rowe is currently in the action stage of change. As such, her goal is to continue with weight loss efforts. She has agreed to the Category 3 Plan.   Exercise goals: All adults should avoid inactivity. Some physical activity is better than none, and adults who participate in any amount of physical activity gain some health benefits.  Behavioral modification strategies: planning for success.  Deanna Rowe has agreed to follow-up with our clinic in 3 weeks. She was informed of the importance of frequent follow-up visits to maximize her success with intensive lifestyle modifications for her multiple health conditions.   Objective:   Blood pressure 101/62, pulse 64, temperature (!) 97.5 F (36.4 C), height 5\' 6"  (1.676 m), weight 220 lb (99.8 kg), SpO2 100 %. Body mass index is 35.51 kg/m.  General: Cooperative, alert, well developed, in no acute distress. HEENT: Conjunctivae and lids unremarkable. Cardiovascular: Regular rhythm.  Lungs: Normal work of breathing. Neurologic: No focal deficits.   Lab Results  Component Value Date   CREATININE 1.43 (H) 03/02/2020   BUN 13 03/02/2020  NA 136 03/02/2020   K 4.7 03/02/2020   CL 98 03/02/2020   CO2 27 03/02/2020   Lab Results  Component Value Date   ALT 21 04/16/2019   AST 23 04/16/2019   ALKPHOS 58 04/16/2019   BILITOT 0.3 04/16/2019   Lab Results  Component Value Date   HGBA1C 7.9 (H) 04/16/2019   HGBA1C 6.9 11/25/2016   HGBA1C 6.0 06/10/2016   HGBA1C 5.9 02/12/2016   HGBA1C 6.1  12/01/2015   Lab Results  Component Value Date   INSULIN 12.3 04/16/2019   Lab Results  Component Value Date   TSH 1.780 04/16/2019   Lab Results  Component Value Date   CHOL 155 04/16/2019   HDL 84 04/16/2019   LDLCALC 57 04/16/2019   TRIG 71 04/16/2019   Lab Results  Component Value Date   WBC 6.8 04/16/2019   HGB 12.3 04/16/2019   HCT 36.1 04/16/2019   MCV 96 04/16/2019   PLT 274 04/16/2019   No results found for: IRON, TIBC, FERRITIN  Attestation Statements:   Reviewed by clinician on day of visit: allergies, medications, problem list, medical history, surgical history, family history, social history, and previous encounter notes.   Wilhemena Durie, am acting as Location manager for [employer], FNP-C.  I have reviewed the above documentation for accuracy and completeness, and I agree with the above. -  Georgianne Fick, FNP

## 2020-06-24 ENCOUNTER — Encounter (INDEPENDENT_AMBULATORY_CARE_PROVIDER_SITE_OTHER): Payer: Self-pay | Admitting: Family Medicine

## 2020-06-25 MED FILL — OZEMPIC 0.25 OR 0.5 MG/DOSE: 2 | 28 days supply | Qty: 2 | Fill #4

## 2020-06-30 ENCOUNTER — Ambulatory Visit: Payer: Self-pay | Admitting: Family Medicine

## 2020-07-02 ENCOUNTER — Other Ambulatory Visit (HOSPITAL_COMMUNITY): Payer: Self-pay

## 2020-07-04 ENCOUNTER — Other Ambulatory Visit (HOSPITAL_BASED_OUTPATIENT_CLINIC_OR_DEPARTMENT_OTHER): Payer: Self-pay

## 2020-07-04 ENCOUNTER — Other Ambulatory Visit (HOSPITAL_COMMUNITY): Payer: Self-pay

## 2020-07-04 MED FILL — Valacyclovir HCl Tab 500 MG: ORAL | 90 days supply | Qty: 90 | Fill #0 | Status: AC

## 2020-07-04 MED FILL — Omalizumab For Inj 150 MG: SUBCUTANEOUS | 28 days supply | Qty: 2 | Fill #0 | Status: CN

## 2020-07-04 MED FILL — Zolpidem Tartrate Tab 5 MG: ORAL | 90 days supply | Qty: 90 | Fill #0 | Status: AC

## 2020-07-04 MED FILL — Atorvastatin Calcium Tab 10 MG (Base Equivalent): ORAL | 90 days supply | Qty: 90 | Fill #0 | Status: AC

## 2020-07-06 ENCOUNTER — Ambulatory Visit: Payer: 59 | Attending: Orthopedic Surgery | Admitting: Physical Therapy

## 2020-07-06 ENCOUNTER — Ambulatory Visit (INDEPENDENT_AMBULATORY_CARE_PROVIDER_SITE_OTHER): Payer: 59 | Admitting: Family Medicine

## 2020-07-06 ENCOUNTER — Other Ambulatory Visit (HOSPITAL_BASED_OUTPATIENT_CLINIC_OR_DEPARTMENT_OTHER): Payer: Self-pay

## 2020-07-06 ENCOUNTER — Other Ambulatory Visit: Payer: Self-pay | Admitting: Family Medicine

## 2020-07-06 ENCOUNTER — Other Ambulatory Visit: Payer: Self-pay

## 2020-07-06 ENCOUNTER — Encounter: Payer: Self-pay | Admitting: Family Medicine

## 2020-07-06 ENCOUNTER — Encounter: Payer: Self-pay | Admitting: Physical Therapy

## 2020-07-06 ENCOUNTER — Other Ambulatory Visit: Payer: Self-pay | Admitting: Allergy and Immunology

## 2020-07-06 VITALS — BP 112/68 | HR 70 | Temp 97.2°F | Resp 12 | Ht 66.0 in | Wt 224.0 lb

## 2020-07-06 DIAGNOSIS — H1013 Acute atopic conjunctivitis, bilateral: Secondary | ICD-10-CM | POA: Diagnosis not present

## 2020-07-06 DIAGNOSIS — J4540 Moderate persistent asthma, uncomplicated: Secondary | ICD-10-CM

## 2020-07-06 DIAGNOSIS — M6281 Muscle weakness (generalized): Secondary | ICD-10-CM | POA: Diagnosis not present

## 2020-07-06 DIAGNOSIS — R2681 Unsteadiness on feet: Secondary | ICD-10-CM | POA: Diagnosis not present

## 2020-07-06 DIAGNOSIS — R262 Difficulty in walking, not elsewhere classified: Secondary | ICD-10-CM | POA: Diagnosis not present

## 2020-07-06 DIAGNOSIS — J3089 Other allergic rhinitis: Secondary | ICD-10-CM | POA: Diagnosis not present

## 2020-07-06 DIAGNOSIS — M25551 Pain in right hip: Secondary | ICD-10-CM | POA: Insufficient documentation

## 2020-07-06 DIAGNOSIS — K219 Gastro-esophageal reflux disease without esophagitis: Secondary | ICD-10-CM

## 2020-07-06 DIAGNOSIS — M25651 Stiffness of right hip, not elsewhere classified: Secondary | ICD-10-CM | POA: Insufficient documentation

## 2020-07-06 MED ORDER — BUDESONIDE-FORMOTEROL FUMARATE 160-4.5 MCG/ACT IN AERO
INHALATION_SPRAY | RESPIRATORY_TRACT | 5 refills | Status: DC
  Filled 2020-07-06 (×2): qty 10.2, 30d supply, fill #0

## 2020-07-06 MED ORDER — MONTELUKAST SODIUM 10 MG PO TABS
ORAL_TABLET | Freq: Every day | ORAL | 5 refills | Status: DC
Start: 2020-07-06 — End: 2020-10-01
  Filled 2020-07-06: qty 30, 30d supply, fill #0

## 2020-07-06 MED ORDER — AZELASTINE HCL 0.1 % NA SOLN
2.0000 | Freq: Two times a day (BID) | NASAL | 5 refills | Status: DC | PRN
  Filled 2020-07-06: qty 30, 15d supply, fill #0

## 2020-07-06 MED ORDER — LEVOCETIRIZINE DIHYDROCHLORIDE 5 MG PO TABS
ORAL_TABLET | Freq: Every evening | ORAL | 5 refills | Status: DC
  Filled 2020-07-06 (×2): qty 30, 30d supply, fill #0

## 2020-07-06 MED ORDER — LEVOCETIRIZINE DIHYDROCHLORIDE 5 MG PO TABS
5.0000 mg | ORAL_TABLET | Freq: Every day | ORAL | 5 refills | Status: DC | PRN
  Filled 2020-07-06: qty 30, 30d supply, fill #0

## 2020-07-06 MED ORDER — DEXLANSOPRAZOLE 60 MG PO CPDR
1.0000 | DELAYED_RELEASE_CAPSULE | Freq: Every day | ORAL | 5 refills | Status: DC
  Filled 2020-07-06: qty 30, 30d supply, fill #0

## 2020-07-06 MED ORDER — ALBUTEROL SULFATE HFA 108 (90 BASE) MCG/ACT IN AERS
2.0000 | INHALATION_SPRAY | RESPIRATORY_TRACT | 2 refills | Status: AC | PRN
  Filled 2020-07-06: qty 18, 30d supply, fill #0

## 2020-07-06 MED ORDER — MONTELUKAST SODIUM 10 MG PO TABS
ORAL_TABLET | Freq: Every day | ORAL | 5 refills | Status: DC
  Filled 2020-07-06: qty 30, 30d supply, fill #0

## 2020-07-06 MED ORDER — OLOPATADINE HCL 0.2 % OP SOLN
1.0000 [drp] | Freq: Every day | OPHTHALMIC | 5 refills | Status: AC | PRN
  Filled 2020-07-06: qty 2.5, 50d supply, fill #0

## 2020-07-06 MED ORDER — FAMOTIDINE 20 MG PO TABS
ORAL_TABLET | Freq: Two times a day (BID) | ORAL | 5 refills | Status: DC
  Filled 2020-07-06: qty 60, 30d supply, fill #0

## 2020-07-06 MED ORDER — TRIAMCINOLONE ACETONIDE 55 MCG/ACT NA AERO
2.0000 | INHALATION_SPRAY | Freq: Every day | NASAL | 5 refills | Status: AC
  Filled 2020-07-06: qty 16.9, 60d supply, fill #0

## 2020-07-06 MED FILL — Mirabegron Tab ER 24 HR 50 MG: ORAL | 30 days supply | Qty: 30 | Fill #0 | Status: CN

## 2020-07-06 NOTE — Progress Notes (Signed)
[address] [place] [address] Dept: [phone]  FOLLOW UP NOTE  Patient ID: Deanna Rowe, female    DOB: 17-Aug-1955  Age: 65 y.o. MRN: [id number] Date of Office Visit: 07/06/2020  Assessment  Chief Complaint: No chief complaint on file.  HPI Deanna Rowe is a 65 year old female who presents to the clinic for follow-up visit.  She was last seen in this clinic on 12/05/2019 by Dr. Verlin Fester for evaluation of asthma, allergic rhinitis, allergic conjunctivitis, and reflux.  She is accompanied by her husband who history.  At today's visit she reports her asthma has been much better controlled than before starting Xolair injections.  She does report some shortness of breath with vigorous activity and cough and wheeze mostly occurring while she is lying down at nighttime.  She continues montelukast 10 mg once a day, Symbicort 160-2 puffs twice a day with a spacer, and albuterol about 2 days a week with relief of symptoms.  She continues Xolair 300 mg injections once a week with no large local reactions.  She reports a significant decrease in her asthma symptoms while continuing on Xolair injections.  Allergic rhinitis is reported as moderately well controlled with clear rhinorrhea, occasional nighttime nasal congestion, frequent sneezing, and frequent postnasal drainage.  She continues Xyzal 5 mg once a day, azelastine about 2 times a week and is not currently using nasal saline rinses or Mucinex.   Drug Allergies:  Allergies  Allergen Reactions  . Fluticasone-Salmeterol Anaphylaxis  . Codeine Nausea And Vomiting  . Fetzima [Levomilnacipran] Nausea And Vomiting  . Nsaids Other (See Comments)    Upset stomach   . Trulicity [Dulaglutide] Nausea And Vomiting    Significant and undesirably rapid (40 lbs) weight loss   . Xanax [Alprazolam] Other (See Comments)    disoriented  . Amoxicillin Rash    Has patient had a PCN reaction causing immediate rash, facial/tongue/throat swelling,  SOB or lightheadedness with hypotension: Yes Has patient had a PCN reaction causing severe rash involving mucus membranes or skin necrosis: No Has patient had a PCN reaction that required hospitalization: No Has patient had a PCN reaction occurring within the last 10 years: No If all of the above answers are "NO", then may proceed with Cephalosporin use.   . Pseudoephedrine Hcl Er Palpitations    Physical Exam: BP 112/68   Pulse 70   Temp (!) 97.2 F (36.2 C) (Temporal)   Resp 12   Ht 5\' 6"  (1.676 m)   Wt 224 lb (101.6 kg)   SpO2 96%   BMI 36.15 kg/m    Physical Exam Vitals reviewed.  Constitutional:      Appearance: Normal appearance.  HENT:     Head: Normocephalic and atraumatic.     Right Ear: Tympanic membrane normal.     Left Ear: Tympanic membrane normal.     Nose:     Comments: Bilateral nares normal.  Pharynx normal.  Ears normal.  Eyes normal.    Mouth/Throat:     Pharynx: Oropharynx is clear.  Eyes:     Conjunctiva/sclera: Conjunctivae normal.  Cardiovascular:     Rate and Rhythm: Normal rate and regular rhythm.     Heart sounds: Normal heart sounds. No murmur heard.   Pulmonary:     Effort: Pulmonary effort is normal.     Breath sounds: Normal breath sounds.     Comments: Lungs clear to auscultation Musculoskeletal:        General: Normal range of  motion.     Cervical back: Normal range of motion and neck supple.  Skin:    General: Skin is warm and dry.  Neurological:     Mental Status: She is alert and oriented to person, place, and time.  Psychiatric:        Mood and Affect: Mood normal.        Behavior: Behavior normal.        Thought Content: Thought content normal.        Judgment: Judgment normal.     Diagnostics: FVC 2.39, FEV1 2.03.  Predicted FVC 2.77, predicted FEV1 2.16.  Spirometry indicates normal ventilatory function.  Assessment and Plan: 1. Moderate persistent asthma, unspecified whether complicated   2. Perennial allergic  rhinitis   3. Allergic conjunctivitis of both eyes   4. Gastroesophageal reflux disease, unspecified whether esophagitis present     Meds ordered this encounter  Medications  . levocetirizine (XYZAL) 5 MG tablet    Sig: Take 1 tablet (5 mg total) by mouth daily as needed.    Dispense:  30 tablet    Refill:  5  . montelukast (SINGULAIR) 10 MG tablet    Sig: TAKE 1 TABLET (10 MG TOTAL) BY MOUTH AT BEDTIME.    Dispense:  30 tablet    Refill:  5  . Olopatadine HCl (PATADAY) 0.2 % SOLN    Sig: Place 1 drop into both eyes daily as needed.    Dispense:  2.5 mL    Refill:  5  . budesonide-formoterol (SYMBICORT) 160-4.5 MCG/ACT inhaler    Sig: 2 puffs twice a day with spacer    Dispense:  1 each    Refill:  5  . dexlansoprazole (DEXILANT) 60 MG capsule    Sig: TAKE 1 CAPSULE (60 MG TOTAL) BY MOUTH DAILY.    Dispense:  30 capsule    Refill:  5  . azelastine (ASTELIN) 0.1 % nasal spray    Sig: Place 2 sprays into both nostrils 2 (two) times daily as needed.    Dispense:  30 mL    Refill:  5  . albuterol (VENTOLIN HFA) 108 (90 Base) MCG/ACT inhaler    Sig: Inhale 2 puffs into the lungs as needed for wheezing or shortness of breath.    Dispense:  18 g    Refill:  2  . triamcinolone (NASACORT) 55 MCG/ACT AERO nasal inhaler    Sig: Place 2 sprays into the nose daily.    Dispense:  1 each    Refill:  5    Patient Instructions  Asthma Continue montelukast 10 mg once a day to prevent cough or wheeze Continue Symbicort 160-2 puffs twice a day with a spacer to prevent cough or wheeze Continue albuterol 2 puffs every 4 hours as needed for cough or wheeze For asthma flare, add Flovent 220-2 puffs twice a day for 2 weeks or until cough and wheeze free.  Continue Xolair injections 300 mg once every 4 weeks and have access to an epinephrine auto-injector set. We will contact out biologic coordinator and have these changes to at home injections.   Allergic rhinitis Begin Nasacort 2 sprays in  each nostril once a day. In the right nostril, point the applicator out toward the right ear. In the left nostril, point the applicator out toward the left ear Consider saline nasal rinses as needed for nasal symptoms. Use this before any medicated nasal sprays for best result Continue azelastine nasal spray 2 sprays in each nostril  twice a day as needed for nasal symptoms Continue Xyzal 5 mg once a day only as needed for a runny nose or itch Continue allergen avoidance measures directed toward dust mites, cat, and dog as listed below  Reflux Continue Dexilant 60 mg once a day to control reflux. Continue dietary and lifestyle modifications as listed below Recommend follow up with gastroenterologist for further evaluation  Allergic conjunctivitis Some over the counter eye drops include Pataday one drop in each eye once a day as needed for red, itchy eyes OR Zaditor one drop in each eye twice a day as needed for red itchy eyes.  Call the clinic if this treatment plan is not working well for you  Follow up in 6 months or sooner if needed.   No follow-ups on file.    Thank you for the opportunity to care for this patient.  Please do not hesitate to contact me with questions.  Gareth Morgan, FNP [facility] [place] [facility] [employer]

## 2020-07-06 NOTE — Therapy (Addendum)
[facility] [place] [address]  [place], [place], [address] Phone: [phone]   Fax:  [phone]  Physical Therapy Evaluation  Patient Details  Name: Deanna Rowe MRN: [id number] Date of Birth: 26-Apr-1955 Referring Provider (PT): Rod Can, MD   Encounter Date: 07/06/2020   PT End of Session - 07/06/20 1635    Visit Number 1    Number of Visits 17    Date for PT Re-Evaluation 08/31/20    Authorization Type [facility]    PT Start Time 1660    PT Stop Time 1627    PT Time Calculation (min) 40 min    Activity Tolerance Patient limited by pain    Behavior During Therapy [facility] for tasks assessed/performed           Past Medical History:  Diagnosis Date  . Anemia   . Anxiety   . Arthritis    L HIP  . Asthma   . Avascular necrosis of hip (HCC)    LEFT  . Back pain   . Chest pain   . Constipation   . Depression   . Diabetes mellitus   . Diabetes mellitus, type II ([place])   . Dry cough   . Edema, lower extremity   . Gait abnormality 11/18/2019  . GERD (gastroesophageal reflux disease)   . High cholesterol   . Hypertension   . Insomnia    takes Ambien nightly  . Joint pain   . Kidney disease   . Kidney disease   . Obesity   . Parkinsonism ([place]) 03/02/2017  . PONV (postoperative nausea and vomiting)   . RLS (restless legs syndrome) 01/15/2018  . Sleep apnea   . Swallowing difficulty   . Thyroid disease   . TIA (transient ischemic attack) 2012   no residual problems  . Urgency incontinence   . Vitamin D deficiency     Past Surgical History:  Procedure Laterality Date  . DILATION AND CURETTAGE OF UTERUS    . ESOPHAGEAL MANOMETRY N/A 09/03/2012   Procedure: ESOPHAGEAL MANOMETRY (EM);  Surgeon: Garlan Fair, MD;  Location: [facility] ENDOSCOPY;  Service: Endoscopy;  Laterality: N/A;  . ESOPHAGEAL MANOMETRY N/A 06/19/2017   Procedure: ESOPHAGEAL MANOMETRY (EM);  Surgeon: Clarene Essex, MD;  Location: [facility]  ENDOSCOPY;  Service: Endoscopy;  Laterality: N/A;  . EXPLORATORY LAPAROTOMY    . GANGLION CYST EXCISION    . JOINT REPLACEMENT  2011   rt total hip  . KNEE ARTHROSCOPY    . PILONIDAL CYST / SINUS EXCISION    . TOTAL HIP ARTHROPLASTY Left 10/09/2013   Procedure: LEFT TOTAL HIP ARTHROPLASTY ANTERIOR APPROACH;  Surgeon: [name], MD;  Location: [facility] ORS;  Service: Orthopedics;  Laterality: Left;    There were no vitals filed for this visit.    Subjective Assessment - 07/06/20 1549    Subjective Patient reports R hip pain after a fall 3 weeks ago. Reports a hx of B THA. Pain is located over the R lateral hip without N/T or radiation. Worse with prolonged walking, R sidelying, kicking out to the side. Better with rest, Tylenol. No better with ice/heat. Reports 2 other falls in the past 6 months and reports some trouble with her balance. Reports that she has Parkinson's and "my feet just don't' work right." Never had PT for her Parkinson's- diagnosed about 9 months ago. Unsure about trying balance rehab as her hip is top priority currently.  Pertinent History TIA, thyroid disease, Parkinsonism, kidney disease, HTN, HLD, GERD, DMII, depression, L hip avascular necrosis, anxiety, anemia, R THA 2011, L THA 2015, knee arthroscopy    Limitations Walking;House hold activities;Standing    How long can you walk comfortably? 30 minutes    Diagnostic tests per patient- R hip xray was clear    Patient Stated Goals decrease pain    Currently in Pain? Yes    Pain Location Hip    Pain Orientation Right;Lateral    Pain Descriptors / Indicators Sharp    Pain Type Acute pain              [facility] PT Assessment - 07/06/20 1556      Assessment   Medical Diagnosis Contusion of R hip    Referring Provider (PT) Rod Can, MD    Onset Date/Surgical Date 06/15/20    Next MD Visit not scheduled    Prior Therapy yes      Precautions   Precautions Fall      Balance Screen   Has the patient fallen  in the past 6 months Yes    How many times? 3    Has the patient had a decrease in activity level because of a fear of falling?  Yes    Is the patient reluctant to leave their home because of a fear of falling?  Yes      [place] Private residence    Living Arrangements Spouse/significant other    Available Help at Discharge Family    Type of [place] to enter    Entrance Stairs-Number of Steps 4+3    Entrance Stairs-Rails None    Home Layout Two level    Alternate Level Stairs-Number of Steps 12    Alternate Level Stairs-Rails Right    Home Equipment Crutches;Walker - 2 wheels;Cane - single point      Prior Function   Level of Independence Independent   husband helps with making bed, dusting, mopping since fall   Vocation Retired    Leisure walking      Cognition   Overall Cognitive Status Within Functional Limits for tasks assessed      Observation/Other Assessments   Observations postural and R hand resting tremor evident      Sensation   Light Touch Appears Intact      Coordination   Gross Motor Movements are Fluid and Coordinated Yes      Posture/Postural Control   Posture/Postural Control Postural limitations    Postural Limitations Rounded Shoulders;Posterior pelvic tilt      ROM / Strength   AROM / PROM / Strength AROM;Strength      AROM   AROM Assessment Site Hip    Right/Left Hip Right;Left    Right Hip Flexion 100   pain in supine   Right Hip External Rotation  19   pain   Right Hip Internal Rotation  32   pain   Left Hip Flexion 102    Left Hip External Rotation  30    Left Hip Internal Rotation  31      Strength   Strength Assessment Site Hip;Knee;Ankle    Right/Left Hip Right;Left    Right Hip Flexion 4/5    Right Hip ABduction 4/5    Right Hip ADduction 4+/5    Left Hip Flexion 4+/5    Left Hip ABduction 4+/5    Left Hip ADduction 4+/5  Right/Left Knee Right;Left    Right Knee Flexion  4/5    Right Knee Extension 4/5    Left Knee Flexion 4+/5    Left Knee Extension 4+/5    Right/Left Ankle Right;Left    Right Ankle Dorsiflexion 4/5    Right Ankle Plantar Flexion 4/5    Left Ankle Dorsiflexion 4+/5    Left Ankle Plantar Flexion 4+/5      Flexibility   Soft Tissue Assessment /Muscle Length yes    ITB R mildly tight in Ober's      Palpation   Palpation comment TTP over R greater trochanter, TFL, glute med      Ambulation/Gait   Assistive device None    Gait Pattern Step-through pattern;Decreased step length - left;Decreased step length - right;Right flexed knee in stance;Left flexed knee in stance;Trunk flexed   slight toe walking   Ambulation Surface Level;Indoor    Gait velocity decreased                      Objective measurements completed on examination: See above findings.               PT Education - 07/06/20 1635    Education Details prognosis, POC, HEP    Person(s) Educated Patient    Methods Explanation;Demonstration;Verbal cues;Tactile cues;Handout    Comprehension Verbalized understanding;Returned demonstration            PT Short Term Goals - 07/06/20 1641      PT SHORT TERM GOAL #1   Title Patient to be independent with initial HEP.    Time 2    Period Weeks    Status New    Target Date 07/20/20             PT Long Term Goals - 07/06/20 1641      PT LONG TERM GOAL #1   Title Patient to be independent with advanced HEP.    Time 8    Period Weeks    Status New    Target Date 08/31/20      PT LONG TERM GOAL #2   Title Patient to demonstrate R hip AROM WFL and without pain limiting.    Time 8    Period Weeks    Status New    Target Date 08/31/20      PT LONG TERM GOAL #3   Title Patient to demonstrate R LE strength >/=4+/5.    Time 8    Period Weeks    Status New    Target Date 08/31/20      PT LONG TERM GOAL #4   Title Patient to report tolerance of 1 hour on her feet without pain limiting.     Time 8    Period Weeks    Status New    Target Date 08/31/20      PT LONG TERM GOAL #5   Title Patient to score 48/56 on [place] in order to decrease risk of falls.    Time 8    Period Weeks    Status New    Target Date 08/31/20                  Plan - 07/06/20 1636    Clinical Impression Statement Patient is a 65 y/o F with PMH significant for Parkinsonism, presenting to OPPT with c/o acute R hip pain for the past 3 weeks after a fall. Pain occurs over the R lateral hip without N/T  or radiation. Worse with prolonged walking, R sidelying, hip abduction. Patient with report of 3 falls in the past 6 months d/t c/o imbalance, however hesitant to try balance rehab at this time. Patient today presenting with rounded posture, painful and limited R hip AROM, marked R LE weakness, slight R TFL tightness, TTP over R greater trochanter, TFL, glute med, and gait deviations. Patient with limited positional tolerance today d/t R hip pain, thus assessment limited. Patient was educated on gentle stretching and AROM HEP- patient reported understanding. Would benefit from skilled PT services 2x/week for 8 weeks to address aforementioned impairments.    Personal Factors and Comorbidities Age;Comorbidity 3+;Fitness;Past/Current Experience;Time since onset of injury/illness/exacerbation    Comorbidities TIA, thyroid disease, Parkinsonism, kidney disease, HTN, HLD, GERD, DMII, depression, L hip avascular necrosis, anxiety, anemia, R THA 2011, L THA 2015, knee arthroscopy    Examination-Activity Limitations Sleep;Bed Mobility;Bend;Squat;Stairs;Carry;Stand;Dressing;Transfers;Lift;Locomotion Level;Reach Overhead;Hygiene/Grooming    Examination-Participation Restrictions Meal Prep;Laundry;Community Activity;Shop;Cleaning;Church    Stability/Clinical Decision Making Stable/Uncomplicated    Clinical Decision Making Low    Rehab Potential Good    PT Frequency 2x / week    PT Duration 8 weeks    PT  Treatment/Interventions ADLs/Self Care Home Management;Cryotherapy;Electrical Stimulation;Iontophoresis 4mg /ml Dexamethasone;Moist Heat;Balance training;Therapeutic exercise;Therapeutic activities;Functional mobility training;Stair training;Gait training;Ultrasound;Neuromuscular re-education;Patient/family education;Manual techniques;Taping;Energy conservation;Dry needling;Passive range of motion;Scar mobilization    PT Next Visit Plan hip FOTO; reassess HEP; progress R hip AROM and R LE strengthening to tolerance    Consulted and Agree with Plan of Care Patient           Patient will benefit from skilled therapeutic intervention in order to improve the following deficits and impairments:  Abnormal gait,Decreased activity tolerance,Decreased strength,Increased fascial restricitons,Pain,Decreased balance,Difficulty walking,Improper body mechanics,Decreased range of motion,Decreased safety awareness,Postural dysfunction,Impaired flexibility  Visit Diagnosis: Pain in right hip  Stiffness of right hip, not elsewhere classified  Muscle weakness (generalized)  Difficulty in walking, not elsewhere classified  Unsteadiness on feet   Problem List Patient Active Problem List   Diagnosis Date Noted  . Allergic conjunctivitis of both eyes 07/06/2020  . Gait abnormality 11/18/2019  . OSA on CPAP 06/04/2019  . Pain in joint of left shoulder 03/28/2019  . Seasonal allergic conjunctivitis 02/07/2019  . Chronic intermittent hypoxia with obstructive sleep apnea 01/25/2019  . Severe obstructive sleep apnea 01/25/2019  . Excessive daytime sleepiness 01/03/2019  . Non-restorative sleep 01/03/2019  . Nocturia more than twice per night 01/03/2019  . Weight gain, abnormal 01/03/2019  . Snorings 01/03/2019  . Trochanteric bursitis of right hip 04/27/2018  . Encounter for routine screening for malformation using ultrasonics 02/08/2018  . Abnormal weight gain 02/08/2018  . Acid indigestion 02/08/2018   . Acute antritis 02/08/2018  . Class 2 severe obesity with serious comorbidity and body mass index (BMI) of 39.0 to 39.9 in adult ([place]) 02/08/2018  . Perennial allergic rhinitis 02/08/2018  . Antiphospholipid syndrome ([place]) 02/08/2018  . Arthralgia of hip or thigh 02/08/2018  . Asthma, cough variant 02/08/2018  . Vitamin D deficiency 02/08/2018  . Backhand tennis elbow 02/08/2018  . Cannot sleep 02/08/2018  . Chronic kidney disease (CKD), stage III (moderate) ([place]) 02/08/2018  . Current drug use 02/08/2018  . Depression, major, single episode, in partial remission ([place]) 02/08/2018  . Diabetes mellitus with polyneuropathy ([place]) 02/08/2018  . Difficulty hearing 02/08/2018  . Disturbance of skin sensation 02/08/2018  . Hypertension associated with type 2 diabetes mellitus ([place]) 02/08/2018  . Extremity pain 02/08/2018  . Facet syndrome, lumbar 02/08/2018  .  Factor V Leiden ([place]) 02/08/2018  . Fungal infection of nail 02/08/2018  . Gastroenteritis and colitis, viral 02/08/2018  . Hypoglycemia 02/08/2018  . Menopause 02/08/2018  . Other long term (current) drug therapy 02/08/2018  . Other osteonecrosis, unspecified femur ([place]) 02/08/2018  . Pure hypercholesterolemia 02/08/2018  . Recurrent major depressive episodes ([place]) 02/08/2018  . Syncope and collapse 02/08/2018  . Moderate persistent asthma 02/08/2018  . Cough, persistent 02/08/2018  . Restless leg syndrome 01/15/2018  . Degeneration of lumbar intervertebral disc 11/21/2017  . Tremor of right hand 03/02/2017  . Parkinson disease ([place]) 03/02/2017  . UTI (urinary tract infection) 12/01/2015  . Type 2 diabetes mellitus with stage 3b chronic kidney disease, with long-term current use of insulin ([place]) 06/03/2015  . Fast heart beat 04/09/2015  . 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 2 diabetes mellitus ([place]) 04/24/2014  . Postoperative anemia due to acute blood loss 10/10/2013  . Avascular necrosis of bone of left hip ([place]) 10/09/2013  .  Arthritis of pelvic region, degenerative 10/09/2013  . Breath shortness 06/18/2013  . Diabetes mellitus type 2, uncontrolled ([place]) 05/31/2013  . Intermittent claudication ([place]) 04/04/2013  . Anxiety   . Gastroesophageal reflux disease 09/12/2012  . Clinical depression 02/21/2012  . Stroke ([place]) 02/11/2012  . History of revision of total replacement of right hip joint 04/04/2009    Janene Harvey, PT, DPT 07/06/20 4:48 PM   [facility] [address]  [place], [place], [address] Phone: [phone]   Fax:  [phone]  Name: Deanna Rowe MRN: [id number] Date of Birth: [birth year]

## 2020-07-06 NOTE — Patient Instructions (Addendum)
Asthma Continue montelukast 10 mg once a day to prevent cough or wheeze Continue Symbicort 160-2 puffs twice a day with a spacer to prevent cough or wheeze Continue albuterol 2 puffs every 4 hours as needed for cough or wheeze For asthma flare, add Flovent 220-2 puffs twice a day for 2 weeks or until cough and wheeze free.  Continue Xolair injections 300 mg once every 4 weeks and have access to an epinephrine auto-injector set. We will contact out biologic coordinator and have these changed to at home injections.   Allergic rhinitis Begin Nasacort 2 sprays in each nostril once a day. In the right nostril, point the applicator out toward the right ear. In the left nostril, point the applicator out toward the left ear Consider saline nasal rinses as needed for nasal symptoms. Use this before any medicated nasal sprays for best result Continue azelastine nasal spray 2 sprays in each nostril twice a day as needed for nasal symptoms Continue Xyzal 5 mg once a day only as needed for a runny nose or itch Continue allergen avoidance measures directed toward dust mites, cat, and dog as listed below  Reflux Continue Dexilant 60 mg once a day to control reflux. Continue dietary and lifestyle modifications as listed below Recommend follow up with gastroenterologist for further evaluation  Allergic conjunctivitis Some over the counter eye drops include Pataday one drop in each eye once a day as needed for red, itchy eyes OR Zaditor one drop in each eye twice a day as needed for red itchy eyes.  Call the clinic if this treatment plan is not working well for you  Follow up in 6 months or sooner if needed.   Lifestyle Changes for Controlling GERD When you have GERD, stomach acid feels as if it's backing up toward your mouth. Whether or not you take medication to control your GERD, your symptoms can often be improved with lifestyle changes.   Raise Your Head  Reflux is more likely to strike when  you're lying down flat, because stomach fluid can  flow backward more easily. Raising the head of your bed 4-6 inches can help. To do this:  Slide blocks or books under the legs at the head of your bed. Or, place a wedge under  the mattress. Many foam stores can make a suitable wedge for you. The wedge  should run from your waist to the top of your head.  Don't just prop your head on several pillows. This increases pressure on your  stomach. It can make GERD worse.  Watch Your Eating Habits Certain foods may increase the acid in your stomach or relax the lower esophageal sphincter, making GERD more likely. It's best to avoid the following:  Coffee, tea, and carbonated drinks (with and without caffeine)  Fatty, fried, or spicy food  Mint, chocolate, onions, and tomatoes  Any other foods that seem to irritate your stomach or cause you pain  Relieve the Pressure  Eat smaller meals, even if you have to eat more often.  Don't lie down right after you eat. Wait a few hours for your stomach to empty.  Avoid tight belts and tight-fitting clothes.  Lose excess weight.  Tobacco and Alcohol  Control of Dog or Cat Allergen Avoidance is the best way to manage a dog or cat allergy. If you have a dog or cat and are allergic to dog or cats, consider removing the dog or cat from the home. If you have a dog or cat  but don't want to find it a new home, or if your family wants a pet even though someone in the household is allergic, here are some strategies that may help keep symptoms at bay:  1. Keep the pet out of your bedroom and restrict it to only a few rooms. Be advised that keeping the dog or cat in only one room will not limit the allergens to that room. 2. Don't pet, hug or kiss the dog or cat; if you do, wash your hands with soap and water. 3. High-efficiency particulate air (HEPA) cleaners run continuously in a bedroom or living room can reduce allergen levels over time. 4. Regular  use of a high-efficiency vacuum cleaner or a central vacuum can reduce allergen levels. 5. Giving your dog or cat a bath at least once a week can reduce airborne allergen.   Control of Dust Mite Allergen Dust mites play a major role in allergic asthma and rhinitis. They occur in environments with high humidity wherever human skin is found. Dust mites absorb humidity from the atmosphere (ie, they do not drink) and feed on organic matter (including shed human and animal skin). Dust mites are a microscopic type of insect that you cannot see with the naked eye. High levels of dust mites have been detected from mattresses, pillows, carpets, upholstered furniture, bed covers, clothes, soft toys and any woven material. The principal allergen of the dust mite is found in its feces. A gram of dust may contain 1,000 mites and 250,000 fecal particles. Mite antigen is easily measured in the air during house cleaning activities. Dust mites do not bite and do not cause harm to humans, other than by triggering allergies/asthma.  Ways to decrease your exposure to dust mites in your home:  1. Encase mattresses, box springs and pillows with a mite-impermeable barrier or cover  2. Wash sheets, blankets and drapes weekly in hot water (130 F) with detergent and dry them in a dryer on the hot setting.  3. Have the room cleaned frequently with a vacuum cleaner and a damp dust-mop. For carpeting or rugs, vacuuming with a vacuum cleaner equipped with a high-efficiency particulate air (HEPA) filter. The dust mite allergic individual should not be in a room which is being cleaned and should wait 1 hour after cleaning before going into the room.  4. Do not sleep on upholstered furniture (eg, couches).  5. If possible removing carpeting, upholstered furniture and drapery from the home is ideal. Horizontal blinds should be eliminated in the rooms where the person spends the most time (bedroom, study, television room). Washable  vinyl, roller-type shades are optimal.  6. Remove all non-washable stuffed toys from the bedroom. Wash stuffed toys weekly like sheets and blankets above.  7. Reduce indoor humidity to less than 50%. Inexpensive humidity monitors can be purchased at most hardware stores. Do not use a humidifier as can make the problem worse and are not recommended.    Avoid smoking tobacco and drinking alcohol. They can make GERD symptoms worse.

## 2020-07-07 ENCOUNTER — Telehealth: Payer: Self-pay | Admitting: Family Medicine

## 2020-07-07 ENCOUNTER — Encounter (INDEPENDENT_AMBULATORY_CARE_PROVIDER_SITE_OTHER): Payer: Self-pay | Admitting: Family Medicine

## 2020-07-07 ENCOUNTER — Ambulatory Visit (INDEPENDENT_AMBULATORY_CARE_PROVIDER_SITE_OTHER): Payer: 59 | Admitting: Family Medicine

## 2020-07-07 ENCOUNTER — Other Ambulatory Visit (HOSPITAL_COMMUNITY): Payer: Self-pay

## 2020-07-07 ENCOUNTER — Telehealth: Payer: Self-pay | Admitting: *Deleted

## 2020-07-07 ENCOUNTER — Other Ambulatory Visit (HOSPITAL_BASED_OUTPATIENT_CLINIC_OR_DEPARTMENT_OTHER): Payer: Self-pay

## 2020-07-07 VITALS — BP 92/60 | HR 76 | Temp 97.5°F | Ht 66.0 in | Wt 222.0 lb

## 2020-07-07 DIAGNOSIS — N1832 Chronic kidney disease, stage 3b: Secondary | ICD-10-CM | POA: Diagnosis not present

## 2020-07-07 DIAGNOSIS — Z6839 Body mass index (BMI) 39.0-39.9, adult: Secondary | ICD-10-CM

## 2020-07-07 DIAGNOSIS — E1122 Type 2 diabetes mellitus with diabetic chronic kidney disease: Secondary | ICD-10-CM | POA: Diagnosis not present

## 2020-07-07 DIAGNOSIS — Z794 Long term (current) use of insulin: Secondary | ICD-10-CM | POA: Diagnosis not present

## 2020-07-07 DIAGNOSIS — I152 Hypertension secondary to endocrine disorders: Secondary | ICD-10-CM

## 2020-07-07 DIAGNOSIS — Z9181 History of falling: Secondary | ICD-10-CM

## 2020-07-07 DIAGNOSIS — G20 Parkinson's disease: Secondary | ICD-10-CM

## 2020-07-07 DIAGNOSIS — G20A1 Parkinson's disease without dyskinesia, without mention of fluctuations: Secondary | ICD-10-CM

## 2020-07-07 DIAGNOSIS — E1159 Type 2 diabetes mellitus with other circulatory complications: Secondary | ICD-10-CM | POA: Diagnosis not present

## 2020-07-07 MED FILL — Solifenacin Succinate Tab 10 MG: ORAL | 30 days supply | Qty: 30 | Fill #0 | Status: CN

## 2020-07-07 MED FILL — Solifenacin Succinate Tab 10 MG: ORAL | 30 days supply | Qty: 30 | Fill #0 | Status: AC

## 2020-07-07 NOTE — Telephone Encounter (Signed)
Please find out if this is a new symptom, when it started and what makes it better. Thank you

## 2020-07-07 NOTE — Telephone Encounter (Signed)
Will obtain approval for syringes and reach out to patient with instructions when ready to send [place] to [facility]

## 2020-07-07 NOTE — Telephone Encounter (Signed)
Called and spoke to patient about which pills could be making her throat extremely dry that was prescribed by our office. She is taking Pepcid, dexilant, Xyzal, Singulair, and Xolair. Advised her to call other offices that prescribed other meds she's taking. Please advise.

## 2020-07-07 NOTE — Telephone Encounter (Signed)
Please let her know that of the medications she gets from our office, the antihistamines (Xyzal and famotidine) can be very drying. I do not think she has had a change in the way she has been taking these medications though. If she is not having a runny nose or itch she can stop Xyzal. Please also let her know that a high blood sugar can also make her feel thirsty and have a dry mouth. Please ask if her eyes feel dry. Thank you

## 2020-07-07 NOTE — Telephone Encounter (Signed)
Pt states one of the pills she is taking is making her throat extremely dry Would like help in figuring out which one and what to do.

## 2020-07-07 NOTE — Telephone Encounter (Signed)
Called patient back. She stated that her symptoms started 2 weeks ago. She drinks water but it doesn't help.

## 2020-07-07 NOTE — Telephone Encounter (Signed)
Called and spoke with patient. Verbally gave message from Gareth Morgan, [place]. She is not having a runny nose or itching so she is going to stop the Xyzal. She stated, her eyes do not feel dry and her blood sugar has been low.

## 2020-07-07 NOTE — Telephone Encounter (Signed)
-----   Message from Dara Hoyer, FNP sent at 07/06/2020 11:51 AM EDT ----- Hi Syliva Mee, This patient would like to switch over to either prefilled syringes or auto-injectors. Thank you so much. Webb Silversmith

## 2020-07-08 ENCOUNTER — Other Ambulatory Visit (HOSPITAL_BASED_OUTPATIENT_CLINIC_OR_DEPARTMENT_OTHER): Payer: Self-pay

## 2020-07-08 ENCOUNTER — Other Ambulatory Visit (HOSPITAL_COMMUNITY): Payer: Self-pay

## 2020-07-08 MED ORDER — OMALIZUMAB 150 MG/ML ~~LOC~~ SOSY
300.0000 mg | PREFILLED_SYRINGE | SUBCUTANEOUS | 11 refills | Status: DC
  Filled 2020-07-08 (×2): qty 2, 28d supply, fill #0

## 2020-07-08 NOTE — Telephone Encounter (Signed)
Thank you :)

## 2020-07-08 NOTE — Telephone Encounter (Signed)
Called patient and advised approval and Rx to [facility] with storage instrux and bring same into clinic for admins instrux

## 2020-07-09 NOTE — Progress Notes (Addendum)
Chief Complaint:   OBESITY Deanna Rowe is here to discuss her progress with her obesity treatment plan along with follow-up of her obesity related diagnoses. Deanna Rowe is on the Category 3 Plan and states she is following her eating plan approximately 50% of the time. Deanna Rowe states she is not currently exercising.  Today's visit was #: 25 Starting weight: 237 lbs Starting date: 04/16/2019 Today's weight: 222 lbs Today's date: 07/07/2020 Total lbs lost to date: 15 lbs Total lbs lost since last in-office visit: +2  Interim History: Deanna Rowe recently had a birthday party for her daughter at a restaurant and admits she overate and had some cheesecake. She still reports decreased snacking due to Ozempic.  Subjective:   1. Parkinson disease ([place]) Deanna Rowe called neurology about frequent falls. No changes in medication were made. She reports feeling very fatigued/sleepy after episode of falling.  2. Type 2 diabetes mellitus with stage 3b chronic kidney disease, with long-term current use of insulin ([place]) Deanna Rowe still notes good appetite suppression with Ozempic. FBG 100-120. After meals 140-160. Her last A1c was 7.0.. She is also on Basaglar and Humalog. She has had a few hypoglycemic episodes when she skips lunch.   Lab Results  Component Value Date   HGBA1C 7.9 (H) 04/16/2019   HGBA1C 6.9 11/25/2016   HGBA1C 6.0 06/10/2016   Lab Results  Component Value Date   MICROALBUR <0.7 02/12/2016   LDLCALC 57 04/16/2019   CREATININE 1.43 (H) 03/02/2020   Lab Results  Component Value Date   INSULIN 12.3 04/16/2019   3. Hypertension associated with diabetes ([place]) Deanna Rowe's BP is low today. She is on Norvasc 5 mg, propranolol 40 mg BID, and Hyzaar 100-12.5 mg.   BP Readings from Last 3 Encounters:  07/07/20 92/60  07/06/20 112/68  06/22/20 101/62   4. At risk for falling Deanna Rowe is at high risk for fall related to Parkinson's and hypotension.  Assessment/Plan:   1. Parkinson disease  ([place]) Move up appointment with Dr. Jannifer Franklin.  2. Type 2 diabetes mellitus with stage 3b chronic kidney disease, with long-term current use of insulin ([place]) . Continue all medications and do not skip meals.  3. Hypertension associated with diabetes ([place])  Do not take Norvasc tomorrow. Check BP daily and write down readings. Leave Norvasc off if BP is <130/70.  4. At risk for falling Move up appointment with Dr. Jannifer Franklin. Do not take Norvasc tomorrow. Check BP daily and write down readings. Leave Norvasc off if BP is <130/70.  5. Obesity: BMI 35 Deanna Rowe is currently in the action stage of change. As such, her goal is to continue with weight loss efforts. She has agreed to the Category 3 Plan.   Exercise goals: No exercise has been prescribed at this time.  Behavioral modification strategies: decreasing simple carbohydrates.  Deanna Rowe has agreed to follow-up with our clinic in 3-4 weeks.   Objective:   Blood pressure 92/60, pulse 76, temperature (!) 97.5 F (36.4 C), height 5\' 6"  (1.676 m), weight 222 lb (100.7 kg), SpO2 97 %. Body mass index is 35.83 kg/m.  General: using Rolator for ambulation. Seems quite fatigued and drowsy. Oriented x 3. HEENT: Conjunctivae and lids unremarkable. Cardiovascular: Regular rhythm.  Lungs: Normal work of breathing. Neurologic: No focal deficits.   Lab Results  Component Value Date   CREATININE 1.43 (H) 03/02/2020   BUN 13 03/02/2020   NA 136 03/02/2020   K 4.7 03/02/2020   CL 98 03/02/2020   CO2 27 03/02/2020  Lab Results  Component Value Date   ALT 21 04/16/2019   AST 23 04/16/2019   ALKPHOS 58 04/16/2019   BILITOT 0.3 04/16/2019   Lab Results  Component Value Date   HGBA1C 7.9 (H) 04/16/2019   HGBA1C 6.9 11/25/2016   HGBA1C 6.0 06/10/2016   HGBA1C 5.9 02/12/2016   HGBA1C 6.1 12/01/2015   Lab Results  Component Value Date   INSULIN 12.3 04/16/2019   Lab Results  Component Value Date   TSH 1.780 04/16/2019   Lab Results   Component Value Date   CHOL 155 04/16/2019   HDL 84 04/16/2019   LDLCALC 57 04/16/2019   TRIG 71 04/16/2019   Lab Results  Component Value Date   WBC 6.8 04/16/2019   HGB 12.3 04/16/2019   HCT 36.1 04/16/2019   MCV 96 04/16/2019   PLT 274 04/16/2019    Attestation Statements:   Reviewed by clinician on day of visit: allergies, medications, problem list, medical history, surgical history, family history, social history, and previous encounter notes.   Coral Ceo, am acting as Location manager for [employer], [place].  I have reviewed the above documentation for accuracy and completeness, and I agree with the above. -  Georgianne Fick, FNP

## 2020-07-10 ENCOUNTER — Ambulatory Visit: Payer: Self-pay

## 2020-07-10 ENCOUNTER — Other Ambulatory Visit (HOSPITAL_COMMUNITY): Payer: Self-pay

## 2020-07-13 ENCOUNTER — Other Ambulatory Visit (HOSPITAL_COMMUNITY): Payer: Self-pay

## 2020-07-13 ENCOUNTER — Other Ambulatory Visit: Payer: Self-pay

## 2020-07-13 ENCOUNTER — Ambulatory Visit: Payer: 59 | Admitting: Physical Therapy

## 2020-07-13 ENCOUNTER — Encounter (INDEPENDENT_AMBULATORY_CARE_PROVIDER_SITE_OTHER): Payer: Self-pay | Admitting: Family Medicine

## 2020-07-13 ENCOUNTER — Other Ambulatory Visit (HOSPITAL_BASED_OUTPATIENT_CLINIC_OR_DEPARTMENT_OTHER): Payer: Self-pay

## 2020-07-13 ENCOUNTER — Encounter: Payer: Self-pay | Admitting: Physical Therapy

## 2020-07-13 DIAGNOSIS — R2681 Unsteadiness on feet: Secondary | ICD-10-CM | POA: Diagnosis not present

## 2020-07-13 DIAGNOSIS — R262 Difficulty in walking, not elsewhere classified: Secondary | ICD-10-CM | POA: Diagnosis not present

## 2020-07-13 DIAGNOSIS — M6281 Muscle weakness (generalized): Secondary | ICD-10-CM

## 2020-07-13 DIAGNOSIS — M25551 Pain in right hip: Secondary | ICD-10-CM | POA: Diagnosis not present

## 2020-07-13 DIAGNOSIS — M25651 Stiffness of right hip, not elsewhere classified: Secondary | ICD-10-CM | POA: Diagnosis not present

## 2020-07-13 MED FILL — Mirabegron Tab ER 24 HR 50 MG: ORAL | 30 days supply | Qty: 30 | Fill #0 | Status: AC

## 2020-07-13 NOTE — Therapy (Signed)
[facility] [address]  [place], [place], [address] Phone: [phone]   Fax:  [phone]  Physical Therapy Treatment  Patient Details  Name: Deanna Rowe MRN: [id number] Date of Birth: 05-Jan-1956 Referring Provider (PT): Rod Can, MD   Encounter Date: 07/13/2020   PT End of Session - 07/13/20 1700    Visit Number 2    Number of Visits 17    Date for PT Re-Evaluation 08/31/20    Authorization Type [facility]    PT Start Time 6203    PT Stop Time 1658    PT Time Calculation (min) 41 min    Activity Tolerance Patient tolerated treatment well    Behavior During Therapy [facility] for tasks assessed/performed           Past Medical History:  Diagnosis Date  . Anemia   . Anxiety   . Arthritis    L HIP  . Asthma   . Avascular necrosis of hip (HCC)    LEFT  . Back pain   . Chest pain   . Constipation   . Depression   . Diabetes mellitus   . Diabetes mellitus, type II ([place])   . Dry cough   . Edema, lower extremity   . Gait abnormality 11/18/2019  . GERD (gastroesophageal reflux disease)   . High cholesterol   . Hypertension   . Insomnia    takes Ambien nightly  . Joint pain   . Kidney disease   . Kidney disease   . Obesity   . Parkinsonism ([place]) 03/02/2017  . PONV (postoperative nausea and vomiting)   . RLS (restless legs syndrome) 01/15/2018  . Sleep apnea   . Swallowing difficulty   . Thyroid disease   . TIA (transient ischemic attack) 2012   no residual problems  . Urgency incontinence   . Vitamin D deficiency     Past Surgical History:  Procedure Laterality Date  . DILATION AND CURETTAGE OF UTERUS    . ESOPHAGEAL MANOMETRY N/A 09/03/2012   Procedure: ESOPHAGEAL MANOMETRY (EM);  Surgeon: Garlan Fair, MD;  Location: [facility];  Service: Endoscopy;  Laterality: N/A;  . ESOPHAGEAL MANOMETRY N/A 06/19/2017   Procedure: ESOPHAGEAL MANOMETRY (EM);  Surgeon: Clarene Essex, MD;   Location: [facility];  Service: Endoscopy;  Laterality: N/A;  . EXPLORATORY LAPAROTOMY    . GANGLION CYST EXCISION    . JOINT REPLACEMENT  2011   rt total hip  . KNEE ARTHROSCOPY    . PILONIDAL CYST / SINUS EXCISION    . TOTAL HIP ARTHROPLASTY Left 10/09/2013   Procedure: LEFT TOTAL HIP ARTHROPLASTY ANTERIOR APPROACH;  Surgeon: [name], MD;  Location: [facility];  Service: Orthopedics;  Laterality: Left;    There were no vitals filed for this visit.   Subjective Assessment - 07/13/20 1619    Subjective Hip has been feeling "bad." Has not been able to do her exercises d/t being out of town seeing her sister who has been in the hospital.    Pertinent History TIA, thyroid disease, Parkinsonism, kidney disease, HTN, HLD, GERD, DMII, depression, L hip avascular necrosis, anxiety, anemia, R THA 2011, L THA 2015, knee arthroscopy    Diagnostic tests per patient- R hip xray was clear    Patient Stated Goals decrease pain    Currently in Pain? Yes    Pain Score 5     Pain Location Hip    Pain Orientation Right;Lateral  Pain Descriptors / Indicators Sharp    Pain Type Acute pain              [facility] PT Assessment - 07/13/20 0001      Observation/Other Assessments   Focus on Therapeutic Outcomes (FOTO)  Hip: 76                         [facility] Adult PT Treatment/Exercise - 07/13/20 0001      Exercises   Exercises Knee/Hip      Knee/Hip Exercises: Stretches   Piriformis Stretch Right;2 reps;30 seconds    Piriformis Stretch Limitations folded pillow under knee   assisted with positioning     Knee/Hip Exercises: Aerobic   Nustep L3 x 6 min (UEs/LEs)      Knee/Hip Exercises: Standing   Hip Flexion AROM;Both;1 set;10 reps    Hip Flexion Limitations alt marching at counter    Hip Extension Stengthening;Right;Left;1 set;10 reps;Knee straight    Extension Limitations at counter      Knee/Hip Exercises: Seated   Clamshell with TheraBand Red   2x10; cueing to sit up  tall   Other Seated Knee/Hip Exercises R hip IR/ER AROM sitting on 2 airex 10x; R hip IR 10x with yellow TB      Modalities   Modalities Iontophoresis      Iontophoresis   Type of Iontophoresis Dexamethasone    Location R lateral hip    Dose 1.63ml; 70mA*minutes    Time 4 hour patch                  PT Education - 07/13/20 1659    Education Details edu on ionto benefits, wear time, precautions, removal; encouraged patient to increase compliance with HEP    Person(s) Educated Patient    Methods Explanation;Demonstration;Tactile cues;Verbal cues;Handout    Comprehension Verbalized understanding            PT Short Term Goals - 07/13/20 1701      PT SHORT TERM GOAL #1   Title Patient to be independent with initial HEP.    Time 2    Period Weeks    Status On-going    Target Date 07/20/20             PT Long Term Goals - 07/13/20 1701      PT LONG TERM GOAL #1   Title Patient to be independent with advanced HEP.    Time 8    Period Weeks    Status On-going      PT LONG TERM GOAL #2   Title Patient to demonstrate R hip AROM WFL and without pain limiting.    Time 8    Period Weeks    Status On-going      PT LONG TERM GOAL #3   Title Patient to demonstrate R LE strength >/=4+/5.    Time 8    Period Weeks    Status On-going      PT LONG TERM GOAL #4   Title Patient to report tolerance of 1 hour on her feet without pain limiting.    Time 8    Period Weeks    Status On-going      PT LONG TERM GOAL #5   Title Patient to score 48/56 on Berg in order to decrease risk of falls.    Time 8    Period Weeks    Status On-going  Plan - 07/13/20 1700    Clinical Impression Statement Patient arrived to session with report of R hip pain and HEP noncompliance d/t being out of town d/t visiting her sister who has been in the hospital. Reviewed HEP for max benefit and carryover. Patient required minor assistance with positioning with hip ER  stretching, however tolerated this quite well. Able to perform R hip IR/ER AROM within limited ROM but without c/o pain. Able to introduce light resistance with hip IR with good tolerance. Gentle standing LE strengthening ther-ex was performed without complaints, but cueing encouraged upright posture. Received signed PT order for iontophoresis, thus educated patient on this modality as well as benefits, safety, wear time, and precautions. Patient reported understanding, thus applied patch to R lateral hip at end of session.    Comorbidities TIA, thyroid disease, Parkinsonism, kidney disease, HTN, HLD, GERD, DMII, depression, L hip avascular necrosis, anxiety, anemia, R THA 2011, L THA 2015, knee arthroscopy    PT Treatment/Interventions ADLs/Self Care Home Management;Cryotherapy;Electrical Stimulation;Iontophoresis 4mg /ml Dexamethasone;Moist Heat;Balance training;Therapeutic exercise;Therapeutic activities;Functional mobility training;Stair training;Gait training;Ultrasound;Neuromuscular re-education;Patient/family education;Manual techniques;Taping;Energy conservation;Dry needling;Passive range of motion;Scar mobilization    PT Next Visit Plan assess benefit from ionto; berg; progress R hip AROM and R LE strengthening to tolerance    Consulted and Agree with Plan of Care Patient           Patient will benefit from skilled therapeutic intervention in order to improve the following deficits and impairments:  Abnormal gait,Decreased activity tolerance,Decreased strength,Increased fascial restricitons,Pain,Decreased balance,Difficulty walking,Improper body mechanics,Decreased range of motion,Decreased safety awareness,Postural dysfunction,Impaired flexibility  Visit Diagnosis: Pain in right hip  Stiffness of right hip, not elsewhere classified  Muscle weakness (generalized)  Difficulty in walking, not elsewhere classified  Unsteadiness on feet     Problem List Patient Active Problem List    Diagnosis Date Noted  . Allergic conjunctivitis of both eyes 07/06/2020  . Gait abnormality 11/18/2019  . OSA on CPAP 06/04/2019  . Pain in joint of left shoulder 03/28/2019  . Seasonal allergic conjunctivitis 02/07/2019  . Chronic intermittent hypoxia with obstructive sleep apnea 01/25/2019  . Severe obstructive sleep apnea 01/25/2019  . Excessive daytime sleepiness 01/03/2019  . Non-restorative sleep 01/03/2019  . Nocturia more than twice per night 01/03/2019  . Weight gain, abnormal 01/03/2019  . Snorings 01/03/2019  . Trochanteric bursitis of right hip 04/27/2018  . Encounter for routine screening for malformation using ultrasonics 02/08/2018  . Abnormal weight gain 02/08/2018  . Acid indigestion 02/08/2018  . Acute antritis 02/08/2018  . Class 2 severe obesity with serious comorbidity and body mass index (BMI) of 39.0 to 39.9 in adult ([place]) 02/08/2018  . Perennial allergic rhinitis 02/08/2018  . Antiphospholipid syndrome ([place]) 02/08/2018  . Arthralgia of hip or thigh 02/08/2018  . Asthma, cough variant 02/08/2018  . Vitamin D deficiency 02/08/2018  . Backhand tennis elbow 02/08/2018  . Cannot sleep 02/08/2018  . Chronic kidney disease (CKD), stage III (moderate) ([place]) 02/08/2018  . Current drug use 02/08/2018  . Depression, major, single episode, in partial remission ([place]) 02/08/2018  . Diabetes mellitus with polyneuropathy ([place]) 02/08/2018  . Difficulty hearing 02/08/2018  . Disturbance of skin sensation 02/08/2018  . Hypertension associated with type 2 diabetes mellitus ([place]) 02/08/2018  . Extremity pain 02/08/2018  . Facet syndrome, lumbar 02/08/2018  . Factor V Leiden ([place]) 02/08/2018  . Fungal infection of nail 02/08/2018  . Gastroenteritis and colitis, viral 02/08/2018  . Hypoglycemia 02/08/2018  . Menopause 02/08/2018  . Other  long term (current) drug therapy 02/08/2018  . Other osteonecrosis, unspecified femur ([place]) 02/08/2018  . Pure hypercholesterolemia  02/08/2018  . Recurrent major depressive episodes ([place]) 02/08/2018  . Syncope and collapse 02/08/2018  . Moderate persistent asthma 02/08/2018  . Cough, persistent 02/08/2018  . Restless leg syndrome 01/15/2018  . Degeneration of lumbar intervertebral disc 11/21/2017  . Tremor of right hand 03/02/2017  . Parkinson disease ([place]) 03/02/2017  . UTI (urinary tract infection) 12/01/2015  . Type 2 diabetes mellitus with stage 3b chronic kidney disease, with long-term current use of insulin ([place]) 06/03/2015  . Fast heart beat 04/09/2015  . 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 2 diabetes mellitus ([place]) 04/24/2014  . Postoperative anemia due to acute blood loss 10/10/2013  . Avascular necrosis of bone of left hip ([place]) 10/09/2013  . Arthritis of pelvic region, degenerative 10/09/2013  . Breath shortness 06/18/2013  . Diabetes mellitus type 2, uncontrolled ([place]) 05/31/2013  . Intermittent claudication ([place]) 04/04/2013  . Anxiety   . Gastroesophageal reflux disease 09/12/2012  . Clinical depression 02/21/2012  . Stroke ([place]) 02/11/2012  . History of revision of total replacement of right hip joint 04/04/2009    Janene Harvey, PT, DPT 07/13/20 5:03 PM   [facility] [address]  [place], [place], [address] Phone: [phone]   Fax:  [phone]  Name: AUDRIA TAKESHITA MRN: [id number] Date of Birth: 06/10/1955

## 2020-07-13 NOTE — Patient Instructions (Signed)

## 2020-07-15 ENCOUNTER — Ambulatory Visit: Payer: 59 | Admitting: Physical Therapy

## 2020-07-15 ENCOUNTER — Encounter: Payer: Self-pay | Admitting: Physical Therapy

## 2020-07-15 ENCOUNTER — Other Ambulatory Visit: Payer: Self-pay

## 2020-07-15 DIAGNOSIS — M25551 Pain in right hip: Secondary | ICD-10-CM

## 2020-07-15 DIAGNOSIS — M6281 Muscle weakness (generalized): Secondary | ICD-10-CM | POA: Diagnosis not present

## 2020-07-15 DIAGNOSIS — R2681 Unsteadiness on feet: Secondary | ICD-10-CM

## 2020-07-15 DIAGNOSIS — R262 Difficulty in walking, not elsewhere classified: Secondary | ICD-10-CM | POA: Diagnosis not present

## 2020-07-15 DIAGNOSIS — M25651 Stiffness of right hip, not elsewhere classified: Secondary | ICD-10-CM | POA: Diagnosis not present

## 2020-07-15 NOTE — Therapy (Signed)
[facility] [address]  [place], [place], [address] Phone: [phone]   Fax:  [phone]  Physical Therapy Treatment  Patient Details  Name: Deanna Rowe MRN: [id number] Date of Birth: 1956/03/18 Referring Provider (PT): Rod Can, MD   Encounter Date: 07/15/2020   PT End of Session - 07/15/20 1656    Visit Number 3    Number of Visits 17    Date for PT Re-Evaluation 08/31/20    Authorization Type [facility]    PT Start Time 1615    PT Stop Time 1655    PT Time Calculation (min) 40 min    Activity Tolerance Patient tolerated treatment well;Patient limited by pain    Behavior During Therapy [facility] for tasks assessed/performed           Past Medical History:  Diagnosis Date  . Anemia   . Anxiety   . Arthritis    L HIP  . Asthma   . Avascular necrosis of hip (HCC)    LEFT  . Back pain   . Chest pain   . Constipation   . Depression   . Diabetes mellitus   . Diabetes mellitus, type II ([place])   . Dry cough   . Edema, lower extremity   . Gait abnormality 11/18/2019  . GERD (gastroesophageal reflux disease)   . High cholesterol   . Hypertension   . Insomnia    takes Ambien nightly  . Joint pain   . Kidney disease   . Kidney disease   . Obesity   . Parkinsonism ([place]) 03/02/2017  . PONV (postoperative nausea and vomiting)   . RLS (restless legs syndrome) 01/15/2018  . Sleep apnea   . Swallowing difficulty   . Thyroid disease   . TIA (transient ischemic attack) 2012   no residual problems  . Urgency incontinence   . Vitamin D deficiency     Past Surgical History:  Procedure Laterality Date  . DILATION AND CURETTAGE OF UTERUS    . ESOPHAGEAL MANOMETRY N/A 09/03/2012   Procedure: ESOPHAGEAL MANOMETRY (EM);  Surgeon: Garlan Fair, MD;  [facility];  Service: Endoscopy;  Laterality: N/A;  . ESOPHAGEAL MANOMETRY N/A 06/19/2017   Procedure: ESOPHAGEAL MANOMETRY (EM);  Surgeon:  Clarene Essex, MD;  [facility];  Service: Endoscopy;  Laterality: N/A;  . EXPLORATORY LAPAROTOMY    . GANGLION CYST EXCISION    . JOINT REPLACEMENT  2011   rt total hip  . KNEE ARTHROSCOPY    . PILONIDAL CYST / SINUS EXCISION    . TOTAL HIP ARTHROPLASTY Left 10/09/2013   Procedure: LEFT TOTAL HIP ARTHROPLASTY ANTERIOR APPROACH;  Surgeon: [name], MD;  [facility];  Service: Orthopedics;  Laterality: Left;    There were no vitals filed for this visit.   Subjective Assessment - 07/15/20 1617    Subjective Doing well. Hip is feeling better- ionto patch helped.    Pertinent History TIA, thyroid disease, Parkinsonism, kidney disease, HTN, HLD, GERD, DMII, depression, L hip avascular necrosis, anxiety, anemia, R THA 2011, L THA 2015, knee arthroscopy    Diagnostic tests per patient- R hip xray was clear    Patient Stated Goals decrease pain    Currently in Pain? No/denies              [facility] PT Assessment - 07/15/20 0001      Standardized Balance Assessment   Standardized Balance Assessment Berg Balance  Test      Berg Balance Test   Sit to Stand Able to stand  independently using hands    Standing Unsupported Able to stand safely 2 minutes    Sitting with Back Unsupported but Feet Supported on Floor or Stool Able to sit safely and securely 2 minutes    Stand to Sit Sits safely with minimal use of hands    Transfers Able to transfer safely, definite need of hands    Standing Unsupported with Eyes Closed Able to stand 10 seconds safely    Standing Unsupported with Feet Together Able to place feet together independently and stand 1 minute safely    From Standing, Reach Forward with Outstretched Arm Can reach forward >12 cm safely (5")    From Standing Position, Pick up Object from Floor Able to pick up shoe, needs supervision    From Standing Position, Turn to Look Behind Over each Shoulder Looks behind one side only/other side shows less weight shift    Turn 360  Degrees Able to turn 360 degrees safely but slowly    Standing Unsupported, Alternately Place Feet on Step/Stool Able to stand independently and complete 8 steps >20 seconds    Standing Unsupported, One Foot in [employer] balance while stepping or standing    Standing on One Leg Tries to lift leg/unable to hold 3 seconds but remains standing independently    Total Score 41                         [facility] Adult PT Treatment/Exercise - 07/15/20 0001      Knee/Hip Exercises: Aerobic   Nustep L4 x 6 min (UEs/LEs)      Knee/Hip Exercises: Standing   Hip Flexion AROM;Both;1 set;10 reps    Hip Flexion Limitations alt marching at counter; 10x 0#, 10x 2#    Hip Abduction Stengthening;Right;Left;1 set;5 reps;Knee straight    Abduction Limitations at TM rail   c/o Mild R hip pain   Hip Extension Stengthening;Right;Left;1 set;10 reps;Knee straight    Extension Limitations at TM rail      Knee/Hip Exercises: Seated   Clamshell with TheraBand Green;Red   10x each; cues to increase ROM on R                 PT Education - 07/15/20 1656    Education Details discussed score on Berg and functional relevance, discussed comfort with positioning and HEP.    Person(s) Educated Patient    Methods Explanation    Comprehension Verbalized understanding            PT Short Term Goals - 07/13/20 1701      PT SHORT TERM GOAL #1   Title Patient to be independent with initial HEP.    Time 2    Period Weeks    Status On-going    Target Date 07/20/20             PT Long Term Goals - 07/15/20 1636      PT LONG TERM GOAL #1   Title Patient to be independent with advanced HEP.    Time 8    Period Weeks    Status On-going      PT LONG TERM GOAL #2   Title Patient to demonstrate R hip AROM WFL and without pain limiting.    Time 8    Period Weeks    Status On-going      PT LONG  TERM GOAL #3   Title Patient to demonstrate R LE strength >/=4+/5.    Time 8    Period Weeks     Status On-going      PT LONG TERM GOAL #4   Title Patient to report tolerance of 1 hour on her feet without pain limiting.    Time 8    Period Weeks    Status On-going      PT LONG TERM GOAL #5   Title Patient to score 48/56 on Berg in order to decrease risk of falls.    Time 8    Period Weeks    Status On-going   07/15/20: 41/56                Plan - 07/15/20 1657    Clinical Impression Statement Patient arrived to session pain-free, reporting improvement in hip pain from ionto patch. Notes that she has tried supine positioning since this recent improvement in pain but is still unable to tolerate it, thus avoided supine and sidelying positioning. Patient scored 41/56 on Berg, indicating increased risk of falls. Most challenge occurred with tandem and narrow BOS as well as STS transfers. Increased resistance with sitting clams with cueing to increased ROM on R hip. Also able to tolerate increased resistance with hip flexion. Did note mild R hip pain with standing hip abduction, thus performed limited reps. Modalities deferred today d/t low pain levels. Patient without complaints at end of session.    Comorbidities TIA, thyroid disease, Parkinsonism, kidney disease, HTN, HLD, GERD, DMII, depression, L hip avascular necrosis, anxiety, anemia, R THA 2011, L THA 2015, knee arthroscopy    PT Treatment/Interventions ADLs/Self Care Home Management;Cryotherapy;Electrical Stimulation;Iontophoresis 4mg /ml Dexamethasone;Moist Heat;Balance training;Therapeutic exercise;Therapeutic activities;Functional mobility training;Stair training;Gait training;Ultrasound;Neuromuscular re-education;Patient/family education;Manual techniques;Taping;Energy conservation;Dry needling;Passive range of motion;Scar mobilization    PT Next Visit Plan progress R hip AROM and R LE strengthening to tolerance    Consulted and Agree with Plan of Care Patient           Patient will benefit from skilled therapeutic  intervention in order to improve the following deficits and impairments:  Abnormal gait,Decreased activity tolerance,Decreased strength,Increased fascial restricitons,Pain,Decreased balance,Difficulty walking,Improper body mechanics,Decreased range of motion,Decreased safety awareness,Postural dysfunction,Impaired flexibility  Visit Diagnosis: Pain in right hip  Stiffness of right hip, not elsewhere classified  Muscle weakness (generalized)  Difficulty in walking, not elsewhere classified  Unsteadiness on feet     Problem List Patient Active Problem List   Diagnosis Date Noted  . Allergic conjunctivitis of both eyes 07/06/2020  . Gait abnormality 11/18/2019  . OSA on CPAP 06/04/2019  . Pain in joint of left shoulder 03/28/2019  . Seasonal allergic conjunctivitis 02/07/2019  . Chronic intermittent hypoxia with obstructive sleep apnea 01/25/2019  . Severe obstructive sleep apnea 01/25/2019  . Excessive daytime sleepiness 01/03/2019  . Non-restorative sleep 01/03/2019  . Nocturia more than twice per night 01/03/2019  . Weight gain, abnormal 01/03/2019  . Snorings 01/03/2019  . Trochanteric bursitis of right hip 04/27/2018  . Encounter for routine screening for malformation using ultrasonics 02/08/2018  . Abnormal weight gain 02/08/2018  . Acid indigestion 02/08/2018  . Acute antritis 02/08/2018  . Class 2 severe obesity with serious comorbidity and body mass index (BMI) of 39.0 to 39.9 in adult ([place]) 02/08/2018  . Perennial allergic rhinitis 02/08/2018  . Antiphospholipid syndrome ([place]) 02/08/2018  . Arthralgia of hip or thigh 02/08/2018  . Asthma, cough variant 02/08/2018  . Vitamin D deficiency 02/08/2018  .  Backhand tennis elbow 02/08/2018  . Cannot sleep 02/08/2018  . Chronic kidney disease (CKD), stage III (moderate) ([place]) 02/08/2018  . Current drug use 02/08/2018  . Depression, major, single episode, in partial remission ([place]) 02/08/2018  . Diabetes mellitus with  polyneuropathy ([place]) 02/08/2018  . Difficulty hearing 02/08/2018  . Disturbance of skin sensation 02/08/2018  . Hypertension associated with type 2 diabetes mellitus ([place]) 02/08/2018  . Extremity pain 02/08/2018  . Facet syndrome, lumbar 02/08/2018  . Factor V Leiden ([place]) 02/08/2018  . Fungal infection of nail 02/08/2018  . Gastroenteritis and colitis, viral 02/08/2018  . Hypoglycemia 02/08/2018  . Menopause 02/08/2018  . Other long term (current) drug therapy 02/08/2018  . Other osteonecrosis, unspecified femur ([place]) 02/08/2018  . Pure hypercholesterolemia 02/08/2018  . Recurrent major depressive episodes ([place]) 02/08/2018  . Syncope and collapse 02/08/2018  . Moderate persistent asthma 02/08/2018  . Cough, persistent 02/08/2018  . Restless leg syndrome 01/15/2018  . Degeneration of lumbar intervertebral disc 11/21/2017  . Tremor of right hand 03/02/2017  . Parkinson disease ([place]) 03/02/2017  . UTI (urinary tract infection) 12/01/2015  . Type 2 diabetes mellitus with stage 3b chronic kidney disease, with long-term current use of insulin ([place]) 06/03/2015  . Fast heart beat 04/09/2015  . 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 2 diabetes mellitus ([place]) 04/24/2014  . Postoperative anemia due to acute blood loss 10/10/2013  . Avascular necrosis of bone of left hip ([place]) 10/09/2013  . Arthritis of pelvic region, degenerative 10/09/2013  . Breath shortness 06/18/2013  . Diabetes mellitus type 2, uncontrolled ([place]) 05/31/2013  . Intermittent claudication ([place]) 04/04/2013  . Anxiety   . Gastroesophageal reflux disease 09/12/2012  . Clinical depression 02/21/2012  . Stroke ([place]) 02/11/2012  . History of revision of total replacement of right hip joint 04/04/2009     Janene Harvey, PT, DPT 07/15/20 4:58 PM   [facility] [address]  [place], [place], [address] Phone: [phone]   Fax:  [phone]  Name: Deanna Rowe MRN: [id number] Date of Birth: [birth year]

## 2020-07-16 ENCOUNTER — Ambulatory Visit (INDEPENDENT_AMBULATORY_CARE_PROVIDER_SITE_OTHER): Payer: 59

## 2020-07-16 DIAGNOSIS — J4540 Moderate persistent asthma, uncomplicated: Secondary | ICD-10-CM | POA: Diagnosis not present

## 2020-07-17 ENCOUNTER — Other Ambulatory Visit (HOSPITAL_BASED_OUTPATIENT_CLINIC_OR_DEPARTMENT_OTHER): Payer: Self-pay

## 2020-07-17 MED FILL — Bupropion HCl Tab ER 24HR 150 MG: ORAL | 90 days supply | Qty: 270 | Fill #0 | Status: AC

## 2020-07-20 ENCOUNTER — Other Ambulatory Visit (HOSPITAL_BASED_OUTPATIENT_CLINIC_OR_DEPARTMENT_OTHER): Payer: Self-pay

## 2020-07-20 ENCOUNTER — Ambulatory Visit: Payer: 59 | Admitting: Physical Therapy

## 2020-07-21 ENCOUNTER — Other Ambulatory Visit (HOSPITAL_BASED_OUTPATIENT_CLINIC_OR_DEPARTMENT_OTHER): Payer: Self-pay

## 2020-07-21 MED ORDER — AMLODIPINE BESYLATE 5 MG PO TABS
1.0000 | ORAL_TABLET | Freq: Every day | ORAL | 1 refills | Status: DC
  Filled 2020-07-21: qty 90, 90d supply, fill #0
  Filled 2020-10-09: qty 90, 90d supply, fill #1

## 2020-07-23 ENCOUNTER — Other Ambulatory Visit (HOSPITAL_BASED_OUTPATIENT_CLINIC_OR_DEPARTMENT_OTHER): Payer: Self-pay

## 2020-07-23 ENCOUNTER — Ambulatory Visit: Payer: 59 | Admitting: Physical Therapy

## 2020-07-23 ENCOUNTER — Encounter: Payer: Self-pay | Admitting: Physical Therapy

## 2020-07-23 ENCOUNTER — Other Ambulatory Visit: Payer: Self-pay

## 2020-07-23 VITALS — BP 115/72 | HR 60

## 2020-07-23 DIAGNOSIS — M25551 Pain in right hip: Secondary | ICD-10-CM

## 2020-07-23 DIAGNOSIS — M6281 Muscle weakness (generalized): Secondary | ICD-10-CM

## 2020-07-23 DIAGNOSIS — R2681 Unsteadiness on feet: Secondary | ICD-10-CM

## 2020-07-23 DIAGNOSIS — R262 Difficulty in walking, not elsewhere classified: Secondary | ICD-10-CM

## 2020-07-23 DIAGNOSIS — M25651 Stiffness of right hip, not elsewhere classified: Secondary | ICD-10-CM

## 2020-07-23 NOTE — Therapy (Signed)
[facility] [address]  [place], [place], [address] Phone: [phone]   Fax:  [phone]  Physical Therapy Treatment  Patient Details  Name: Deanna Rowe MRN: [id number] Date of Birth: [birth year] Referring Provider (PT): Rod Can, MD   Encounter Date: 07/23/2020   PT End of Session - 07/23/20 1740    Visit Number 4    Number of Visits 17    Date for PT Re-Evaluation 08/31/20    Authorization Type [facility]    PT Start Time 8366    PT Stop Time 1735    PT Time Calculation (min) 45 min    Equipment Utilized During Treatment Gait belt    Activity Tolerance Patient tolerated treatment well    Behavior During Therapy [facility] for tasks assessed/performed           Past Medical History:  Diagnosis Date  . Anemia   . Anxiety   . Arthritis    L HIP  . Asthma   . Avascular necrosis of hip (HCC)    LEFT  . Back pain   . Chest pain   . Constipation   . Depression   . Diabetes mellitus   . Diabetes mellitus, type II ([place])   . Dry cough   . Edema, lower extremity   . Gait abnormality 11/18/2019  . GERD (gastroesophageal reflux disease)   . High cholesterol   . Hypertension   . Insomnia    takes Ambien nightly  . Joint pain   . Kidney disease   . Kidney disease   . Obesity   . Parkinsonism ([place]) 03/02/2017  . PONV (postoperative nausea and vomiting)   . RLS (restless legs syndrome) 01/15/2018  . Sleep apnea   . Swallowing difficulty   . Thyroid disease   . TIA (transient ischemic attack) 2012   no residual problems  . Urgency incontinence   . Vitamin D deficiency     Past Surgical History:  Procedure Laterality Date  . DILATION AND CURETTAGE OF UTERUS    . ESOPHAGEAL MANOMETRY N/A 09/03/2012   Procedure: ESOPHAGEAL MANOMETRY (EM);  Surgeon: Garlan Fair, MD;  [facility];  Service: Endoscopy;  Laterality: N/A;  . ESOPHAGEAL MANOMETRY N/A 06/19/2017   Procedure: ESOPHAGEAL  MANOMETRY (EM);  Surgeon: Clarene Essex, MD;  [facility];  Service: Endoscopy;  Laterality: N/A;  . EXPLORATORY LAPAROTOMY    . GANGLION CYST EXCISION    . JOINT REPLACEMENT  2011   rt total hip  . KNEE ARTHROSCOPY    . PILONIDAL CYST / SINUS EXCISION    . TOTAL HIP ARTHROPLASTY Left 10/09/2013   Procedure: LEFT TOTAL HIP ARTHROPLASTY ANTERIOR APPROACH;  Surgeon: [name], MD;  [facility];  Service: Orthopedics;  Laterality: Left;    Vitals:   07/23/20 1652  BP: 115/72  Pulse: 60  SpO2: 93%     Subjective Assessment - 07/23/20 1655    Subjective Reports that she had a fall yesterday in the closet- felt fully and went down. Fell on a basket and did not hurt herself, but had to take Tylenol that night d/t hip pain. Had another fall last week when going up stairs and fell forward. Husband caught her. Husband insists that she uses a 4WW. Denies dizziness/lightheadedness.    Pertinent History TIA, thyroid disease, Parkinsonism, kidney disease, HTN, HLD, GERD, DMII, depression, L hip avascular necrosis, anxiety, anemia, R THA 2011, L THA 2015,  knee arthroscopy    Diagnostic tests per patient- R hip xray was clear    Patient Stated Goals decrease pain    Currently in Pain? No/denies                             [facility] Adult PT Treatment/Exercise - 07/23/20 0001      Ambulation/Gait   Ambulation Distance (Feet) 180 Feet    Assistive device 4-wheeled walker;Straight cane    Gait Pattern Step-through pattern;Decreased step length - left;Decreased step length - right;Right flexed knee in stance;Left flexed knee in stance;Trunk flexed    Ambulation Surface Level;Indoor    Gait velocity decreased    Stairs Yes    Stairs Assistance 4: Min guard    Stair Management Technique One rail Right;One rail Left;With cane    Number of Stairs 13    Height of Stairs 8    Gait Comments gait training and instruction on safety with transfers with 4WW; edu on [facility]  sequencing with SPC; stair training with instruction on proper SPC sequencing for max safety, using L-LE dominant step-to pattern   pt's walker brakes broken, thus encouraged SPC use instead     Knee/Hip Exercises: Aerobic   Nustep L4 x 6 min (UEs/LEs)      Knee/Hip Exercises: Seated   Sit to Sand 2 sets;5 reps;without UE support   instruction on set up of transfer and encouragement to lean chest forward                 PT Education - 07/23/20 1740    Education Details update to HEP + handout on proper stair SPC sequencing    Person(s) Educated Patient    Methods Explanation;Demonstration;Tactile cues;Verbal cues;Handout    Comprehension Returned demonstration;Verbalized understanding            PT Short Term Goals - 07/13/20 1701      PT SHORT TERM GOAL #1   Title Patient to be independent with initial HEP.    Time 2    Period Weeks    Status On-going    Target Date 07/20/20             PT Long Term Goals - 07/15/20 1636      PT LONG TERM GOAL #1   Title Patient to be independent with advanced HEP.    Time 8    Period Weeks    Status On-going      PT LONG TERM GOAL #2   Title Patient to demonstrate R hip AROM WFL and without pain limiting.    Time 8    Period Weeks    Status On-going      PT LONG TERM GOAL #3   Title Patient to demonstrate R LE strength >/=4+/5.    Time 8    Period Weeks    Status On-going      PT LONG TERM GOAL #4   Title Patient to report tolerance of 1 hour on her feet without pain limiting.    Time 8    Period Weeks    Status On-going      PT LONG TERM GOAL #5   Title Patient to score 48/56 on Berg in order to decrease risk of falls.    Time 8    Period Weeks    Status On-going   07/15/20: 41/56  Plan - 07/23/20 1741    Clinical Impression Statement Patient arrived to session ambulating with 4WW and with report of having had 2 falls in the past 2 weeks. Reports that her husband insists that she use  the 4WW. Denies dizziness/lightheadedness. Vitals at start of session were [facility]. Upon observation, patient's walker brakes are broke and patient pushing walker too far anterior to be helpful. Instead worked on Personnel officer with [facility] which patient performed with good stability and SPC sequencing. Also worked on IT trainer with [facility] with instruction on proper SPC sequencing for max safety. Patient initially demonstrated difficulty with STS transfers without UE- much improved ease of transfers after practice using proper set up and encouraging anterior trunk lean. Patient reported understanding of HEP handout with instructions on STS transfers and stair training. No complaints at end of session.    Comorbidities TIA, thyroid disease, Parkinsonism, kidney disease, HTN, HLD, GERD, DMII, depression, L hip avascular necrosis, anxiety, anemia, R THA 2011, L THA 2015, knee arthroscopy    PT Treatment/Interventions ADLs/Self Care Home Management;Cryotherapy;Electrical Stimulation;Iontophoresis 4mg /ml Dexamethasone;Moist Heat;Balance training;Therapeutic exercise;Therapeutic activities;Functional mobility training;Stair training;Gait training;Ultrasound;Neuromuscular re-education;Patient/family education;Manual techniques;Taping;Energy conservation;Dry needling;Passive range of motion;Scar mobilization    PT Next Visit Plan progress R hip AROM and R LE strengthening to tolerance; STS transfers, balance training with narrow BOS    Consulted and Agree with Plan of Care Patient           Patient will benefit from skilled therapeutic intervention in order to improve the following deficits and impairments:  Abnormal gait,Decreased activity tolerance,Decreased strength,Increased fascial restricitons,Pain,Decreased balance,Difficulty walking,Improper body mechanics,Decreased range of motion,Decreased safety awareness,Postural dysfunction,Impaired flexibility  Visit Diagnosis: Pain in right hip  Stiffness of right  hip, not elsewhere classified  Muscle weakness (generalized)  Difficulty in walking, not elsewhere classified  Unsteadiness on feet     Problem List Patient Active Problem List   Diagnosis Date Noted  . Allergic conjunctivitis of both eyes 07/06/2020  . Gait abnormality 11/18/2019  . OSA on CPAP 06/04/2019  . Pain in joint of left shoulder 03/28/2019  . Seasonal allergic conjunctivitis 02/07/2019  . Chronic intermittent hypoxia with obstructive sleep apnea 01/25/2019  . Severe obstructive sleep apnea 01/25/2019  . Excessive daytime sleepiness 01/03/2019  . Non-restorative sleep 01/03/2019  . Nocturia more than twice per night 01/03/2019  . Weight gain, abnormal 01/03/2019  . Snorings 01/03/2019  . Trochanteric bursitis of right hip 04/27/2018  . Encounter for routine screening for malformation using ultrasonics 02/08/2018  . Abnormal weight gain 02/08/2018  . Acid indigestion 02/08/2018  . Acute antritis 02/08/2018  . Class 2 severe obesity with serious comorbidity and body mass index (BMI) of 39.0 to 39.9 in adult ([place]) 02/08/2018  . Perennial allergic rhinitis 02/08/2018  . Antiphospholipid syndrome ([place]) 02/08/2018  . Arthralgia of hip or thigh 02/08/2018  . Asthma, cough variant 02/08/2018  . Vitamin D deficiency 02/08/2018  . Backhand tennis elbow 02/08/2018  . Cannot sleep 02/08/2018  . Chronic kidney disease (CKD), stage III (moderate) ([place]) 02/08/2018  . Current drug use 02/08/2018  . Depression, major, single episode, in partial remission ([place]) 02/08/2018  . Diabetes mellitus with polyneuropathy ([place]) 02/08/2018  . Difficulty hearing 02/08/2018  . Disturbance of skin sensation 02/08/2018  . Hypertension associated with type 2 diabetes mellitus ([place]) 02/08/2018  . Extremity pain 02/08/2018  . Facet syndrome, lumbar 02/08/2018  . Factor V Leiden ([place]) 02/08/2018  . Fungal infection of nail 02/08/2018  . Gastroenteritis and colitis, viral 02/08/2018  .  Hypoglycemia 02/08/2018  . Menopause 02/08/2018  . Other long term (current) drug therapy 02/08/2018  . Other osteonecrosis, unspecified femur ([place]) 02/08/2018  . Pure hypercholesterolemia 02/08/2018  . Recurrent major depressive episodes ([place]) 02/08/2018  . Syncope and collapse 02/08/2018  . Moderate persistent asthma 02/08/2018  . Cough, persistent 02/08/2018  . Restless leg syndrome 01/15/2018  . Degeneration of lumbar intervertebral disc 11/21/2017  . Tremor of right hand 03/02/2017  . Parkinson disease ([place]) 03/02/2017  . UTI (urinary tract infection) 12/01/2015  . Type 2 diabetes mellitus with stage 3b chronic kidney disease, with long-term current use of insulin ([place]) 06/03/2015  . Fast heart beat 04/09/2015  . 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 2 diabetes mellitus ([place]) 04/24/2014  . Postoperative anemia due to acute blood loss 10/10/2013  . Avascular necrosis of bone of left hip ([place]) 10/09/2013  . Arthritis of pelvic region, degenerative 10/09/2013  . Breath shortness 06/18/2013  . Diabetes mellitus type 2, uncontrolled ([place]) 05/31/2013  . Intermittent claudication ([place]) 04/04/2013  . Anxiety   . Gastroesophageal reflux disease 09/12/2012  . Clinical depression 02/21/2012  . Stroke ([place]) 02/11/2012  . History of revision of total replacement of right hip joint 04/04/2009     Janene Harvey, PT, DPT 07/23/20 5:43 PM   [facility] [address]  [place], [place], [address] Phone: [phone]   Fax:  [phone]  Name: DANDRIA GRIEGO MRN: [id number] Date of Birth: 1955-09-20

## 2020-07-27 ENCOUNTER — Encounter: Payer: Self-pay | Admitting: Physical Therapy

## 2020-07-27 ENCOUNTER — Other Ambulatory Visit: Payer: Self-pay

## 2020-07-27 ENCOUNTER — Ambulatory Visit: Payer: 59 | Admitting: Physical Therapy

## 2020-07-27 DIAGNOSIS — M6281 Muscle weakness (generalized): Secondary | ICD-10-CM

## 2020-07-27 DIAGNOSIS — R2681 Unsteadiness on feet: Secondary | ICD-10-CM

## 2020-07-27 DIAGNOSIS — M25551 Pain in right hip: Secondary | ICD-10-CM | POA: Diagnosis not present

## 2020-07-27 DIAGNOSIS — M25651 Stiffness of right hip, not elsewhere classified: Secondary | ICD-10-CM

## 2020-07-27 DIAGNOSIS — R262 Difficulty in walking, not elsewhere classified: Secondary | ICD-10-CM

## 2020-07-27 NOTE — Patient Instructions (Signed)
    Home exercise program created by Lexton Hidalgo, PT.  For questions, please contact Leland Raver via phone at [phone] or email at [email or web address]  [facility] [address]  [address] [place], [place], [address] Phone: [phone]   Fax:  [phone]    

## 2020-07-27 NOTE — Therapy (Signed)
[facility] [address]  [place], [place], [address] Phone: [phone]   Fax:  [phone]  Physical Therapy Treatment  Patient Details  Name: Deanna Rowe MRN: [id number] Date of Birth: [birth year] Referring Provider (PT): Rod Can, MD   Encounter Date: 07/27/2020   PT End of Session - 07/27/20 1608    Visit Number 5    Number of Visits 17    Date for PT Re-Evaluation 08/31/20    Authorization Type [facility]    PT Start Time 1608    PT Stop Time 1700    PT Time Calculation (min) 52 min    Equipment Utilized During Treatment Gait belt    Activity Tolerance Patient tolerated treatment well    Behavior During Therapy [facility] for tasks assessed/performed           Past Medical History:  Diagnosis Date  . Anemia   . Anxiety   . Arthritis    L HIP  . Asthma   . Avascular necrosis of hip (HCC)    LEFT  . Back pain   . Chest pain   . Constipation   . Depression   . Diabetes mellitus   . Diabetes mellitus, type II ([place])   . Dry cough   . Edema, lower extremity   . Gait abnormality 11/18/2019  . GERD (gastroesophageal reflux disease)   . High cholesterol   . Hypertension   . Insomnia    takes Ambien nightly  . Joint pain   . Kidney disease   . Kidney disease   . Obesity   . Parkinsonism ([place]) 03/02/2017  . PONV (postoperative nausea and vomiting)   . RLS (restless legs syndrome) 01/15/2018  . Sleep apnea   . Swallowing difficulty   . Thyroid disease   . TIA (transient ischemic attack) 2012   no residual problems  . Urgency incontinence   . Vitamin D deficiency     Past Surgical History:  Procedure Laterality Date  . DILATION AND CURETTAGE OF UTERUS    . ESOPHAGEAL MANOMETRY N/A 09/03/2012   Procedure: ESOPHAGEAL MANOMETRY (EM);  Surgeon: Garlan Fair, MD;  [facility];  Service: Endoscopy;  Laterality: N/A;  . ESOPHAGEAL MANOMETRY N/A 06/19/2017   Procedure: ESOPHAGEAL  MANOMETRY (EM);  Surgeon: Clarene Essex, MD;  [facility];  Service: Endoscopy;  Laterality: N/A;  . EXPLORATORY LAPAROTOMY    . GANGLION CYST EXCISION    . JOINT REPLACEMENT  2011   rt total hip  . KNEE ARTHROSCOPY    . PILONIDAL CYST / SINUS EXCISION    . TOTAL HIP ARTHROPLASTY Left 10/09/2013   Procedure: LEFT TOTAL HIP ARTHROPLASTY ANTERIOR APPROACH;  Surgeon: [name], MD;  [facility];  Service: Orthopedics;  Laterality: Left;    There were no vitals filed for this visit.   Subjective Assessment - 07/27/20 1611    Subjective Pt reports she had another fall on Friday - she was trying to reach for something on her bedside table and was too close the edge of the bed so rolled out of bed as she was reaching landing on the floor inside her walker. She was able to get up by herself after an extended effort but suffered a laceration to her L knee and lower anterior shin - she applied steri-strips to the laceration on her knee (felt like she probably needed stitches but did not go to the ER). Pain was  rough yesterday but no pain reported today.    Pertinent History TIA, thyroid disease, Parkinsonism, kidney disease, HTN, HLD, GERD, DMII, depression, L hip avascular necrosis, anxiety, anemia, R THA 2011, L THA 2015, knee arthroscopy    Diagnostic tests per patient- R hip xray was clear    Patient Stated Goals decrease pain    Currently in Pain? No/denies              [facility] PT Assessment - 07/27/20 1608      Standardized Balance Assessment   Standardized Balance Assessment Five Times Sit to Stand;Timed Up and Go Test    Five times sit to stand comments  22.22 sec with R UE assist (unable to achieve STS w/o UE assist)      Timed Up and Go Test   Normal TUG (seconds) 25.88   with SPC; 14.06 sec w/o AD; 21.59 sec with rollator                        [facility] Adult PT Treatment/Exercise - 07/27/20 1608      Knee/Hip Exercises: Aerobic   Nustep L4 x 6 min  (UEs/LEs)             [facility]) - 07/27/20 1608    PWR! exercises Moves in sitting    PWR! Sit to Stand 2x10    PWR! Up 2x10    PWR! Rock x10    PWR! Twist x10    PWR! Step x10               PT Education - 07/27/20 1700    Education Details Role of PWR! Moves in relation to movement deficits secondary to Parkinson's    Person(s) Educated Patient    Methods Explanation;Demonstration;Verbal cues;Handout    Comprehension Verbalized understanding;Verbal cues required;Returned demonstration;Need further instruction            PT Short Term Goals - 07/13/20 1701      PT SHORT TERM GOAL #1   Title Patient to be independent with initial HEP.    Time 2    Period Weeks    Status On-going    Target Date 07/20/20             PT Long Term Goals - 07/15/20 1636      PT LONG TERM GOAL #1   Title Patient to be independent with advanced HEP.    Time 8    Period Weeks    Status On-going      PT LONG TERM GOAL #2   Title Patient to demonstrate R hip AROM WFL and without pain limiting.    Time 8    Period Weeks    Status On-going      PT LONG TERM GOAL #3   Title Patient to demonstrate R LE strength >/=4+/5.    Time 8    Period Weeks    Status On-going      PT LONG TERM GOAL #4   Title Patient to report tolerance of 1 hour on her feet without pain limiting.    Time 8    Period Weeks    Status On-going      PT LONG TERM GOAL #5   Title Patient to score 48/56 on Berg in order to decrease risk of falls.    Time 8    Period Weeks    Status On-going   07/15/20: 41/56  Plan - 07/27/20 1700    Clinical Impression Statement Jaskirat reports another fall since her last PT visit, this time rolling out of bed while reaching for something on her nightstand suffering lacerations to her L knee and lower leg but denies hitting her head. She notes difficulty getting up from the floor following the fall as well as overall soreness over the weekend but  denies pain today. Pt reports one of biggest limitations with mobility is her inability to transition sit <> stand w/o using her hands with 5xSTS testing = 22.22 sec with R UE assist necessary to complete test (>16 sec indicates high risk for falls for patients with PD). TUG also indicating high risk for falls regardless of use of AD - 25.88 sec with SPC; 14.06 sec w/o AD; 21.59 sec with rollator. Given pt's concerns with difficulty with sit <> stand, introduced seated PWR! Moves to promote posture and weight shifting to facilitate transitional mobility as well as trunk rotation and stepping to promote increased ease of mobility. Patient able to perform return demonstration of all seated PWR Moves with moderate verbal and visual cues and able to utilize PWR! Up to achieve sit to stand transition w/o need for UE assist but struggled with movement patterns for Twist & Step patterns - she will benefit from further practice to improve carryover.    Comorbidities TIA, thyroid disease, Parkinsonism, kidney disease, HTN, HLD, GERD, DMII, depression, L hip avascular necrosis, anxiety, anemia, R THA 2011, L THA 2015, knee arthroscopy    PT Treatment/Interventions ADLs/Self Care Home Management;Cryotherapy;Electrical Stimulation;Iontophoresis 4mg /ml Dexamethasone;Moist Heat;Balance training;Therapeutic exercise;Therapeutic activities;Functional mobility training;Stair training;Gait training;Ultrasound;Neuromuscular re-education;Patient/family education;Manual techniques;Taping;Energy conservation;Dry needling;Passive range of motion;Scar mobilization    PT Next Visit Plan review seated PWR! moves & progress to other positions as indicated; progress R hip AROM and R LE strengthening to tolerance; STS transfers, balance training with narrow BOS    Consulted and Agree with Plan of Care Patient           Patient will benefit from skilled therapeutic intervention in order to improve the following deficits and  impairments:  Abnormal gait,Decreased activity tolerance,Decreased strength,Increased fascial restricitons,Pain,Decreased balance,Difficulty walking,Improper body mechanics,Decreased range of motion,Decreased safety awareness,Postural dysfunction,Impaired flexibility  Visit Diagnosis: Pain in right hip  Stiffness of right hip, not elsewhere classified  Muscle weakness (generalized)  Difficulty in walking, not elsewhere classified  Unsteadiness on feet     Problem List Patient Active Problem List   Diagnosis Date Noted  . Allergic conjunctivitis of both eyes 07/06/2020  . Gait abnormality 11/18/2019  . OSA on CPAP 06/04/2019  . Pain in joint of left shoulder 03/28/2019  . Seasonal allergic conjunctivitis 02/07/2019  . Chronic intermittent hypoxia with obstructive sleep apnea 01/25/2019  . Severe obstructive sleep apnea 01/25/2019  . Excessive daytime sleepiness 01/03/2019  . Non-restorative sleep 01/03/2019  . Nocturia more than twice per night 01/03/2019  . Weight gain, abnormal 01/03/2019  . Snorings 01/03/2019  . Trochanteric bursitis of right hip 04/27/2018  . Encounter for routine screening for malformation using ultrasonics 02/08/2018  . Abnormal weight gain 02/08/2018  . Acid indigestion 02/08/2018  . Acute antritis 02/08/2018  . Class 2 severe obesity with serious comorbidity and body mass index (BMI) of 39.0 to 39.9 in adult ([place]) 02/08/2018  . Perennial allergic rhinitis 02/08/2018  . Antiphospholipid syndrome ([place]) 02/08/2018  . Arthralgia of hip or thigh 02/08/2018  . Asthma, cough variant 02/08/2018  . Vitamin D deficiency 02/08/2018  . Backhand tennis elbow  02/08/2018  . Cannot sleep 02/08/2018  . Chronic kidney disease (CKD), stage III (moderate) ([place]) 02/08/2018  . Current drug use 02/08/2018  . Depression, major, single episode, in partial remission ([place]) 02/08/2018  . Diabetes mellitus with polyneuropathy ([place]) 02/08/2018  . Difficulty hearing  02/08/2018  . Disturbance of skin sensation 02/08/2018  . Hypertension associated with type 2 diabetes mellitus ([place]) 02/08/2018  . Extremity pain 02/08/2018  . Facet syndrome, lumbar 02/08/2018  . Factor V Leiden ([place]) 02/08/2018  . Fungal infection of nail 02/08/2018  . Gastroenteritis and colitis, viral 02/08/2018  . Hypoglycemia 02/08/2018  . Menopause 02/08/2018  . Other long term (current) drug therapy 02/08/2018  . Other osteonecrosis, unspecified femur ([place]) 02/08/2018  . Pure hypercholesterolemia 02/08/2018  . Recurrent major depressive episodes ([place]) 02/08/2018  . Syncope and collapse 02/08/2018  . Moderate persistent asthma 02/08/2018  . Cough, persistent 02/08/2018  . Restless leg syndrome 01/15/2018  . Degeneration of lumbar intervertebral disc 11/21/2017  . Tremor of right hand 03/02/2017  . Parkinson disease ([place]) 03/02/2017  . UTI (urinary tract infection) 12/01/2015  . Type 2 diabetes mellitus with stage 3b chronic kidney disease, with long-term current use of insulin ([place]) 06/03/2015  . Fast heart beat 04/09/2015  . 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 2 diabetes mellitus ([place]) 04/24/2014  . Postoperative anemia due to acute blood loss 10/10/2013  . Avascular necrosis of bone of left hip ([place]) 10/09/2013  . Arthritis of pelvic region, degenerative 10/09/2013  . Breath shortness 06/18/2013  . Diabetes mellitus type 2, uncontrolled ([place]) 05/31/2013  . Intermittent claudication ([place]) 04/04/2013  . Anxiety   . Gastroesophageal reflux disease 09/12/2012  . Clinical depression 02/21/2012  . Stroke ([place]) 02/11/2012  . History of revision of total replacement of right hip joint 04/04/2009    Percival Spanish, PT, MPT 07/27/2020, 5:35 PM  [facility] [address]  [place], [place], [address] Phone: [phone]   Fax:  [phone]  Name: Deanna Rowe MRN: [id number] Date of Birth: 1955-12-09

## 2020-07-29 ENCOUNTER — Other Ambulatory Visit (HOSPITAL_COMMUNITY): Payer: Self-pay

## 2020-07-29 ENCOUNTER — Other Ambulatory Visit: Payer: Self-pay | Admitting: Pharmacist

## 2020-07-29 MED ORDER — OMALIZUMAB 150 MG/ML ~~LOC~~ SOSY
300.0000 mg | PREFILLED_SYRINGE | SUBCUTANEOUS | 11 refills | Status: DC
  Filled 2020-07-29: qty 2, 28d supply, fill #0
  Filled 2020-09-03: qty 2, 28d supply, fill #1
  Filled 2020-10-01: qty 2, 28d supply, fill #2
  Filled 2020-11-02 – 2020-12-09 (×3): qty 2, 28d supply, fill #3
  Filled 2020-12-31: qty 2, 28d supply, fill #4
  Filled 2021-01-27: qty 2, 28d supply, fill #5
  Filled 2021-02-22: qty 2, 28d supply, fill #6
  Filled 2021-03-22: qty 2, 28d supply, fill #7
  Filled 2021-04-26: qty 2, 28d supply, fill #8
  Filled 2021-06-03: qty 2, 28d supply, fill #9
  Filled 2021-06-28: qty 2, 28d supply, fill #10
  Filled 2021-07-26: qty 2, 28d supply, fill #11

## 2020-07-30 ENCOUNTER — Other Ambulatory Visit: Payer: Self-pay

## 2020-07-30 ENCOUNTER — Encounter: Payer: Self-pay | Admitting: Physical Therapy

## 2020-07-30 ENCOUNTER — Ambulatory Visit: Payer: 59 | Admitting: Physical Therapy

## 2020-07-30 DIAGNOSIS — M6281 Muscle weakness (generalized): Secondary | ICD-10-CM | POA: Diagnosis not present

## 2020-07-30 DIAGNOSIS — M25651 Stiffness of right hip, not elsewhere classified: Secondary | ICD-10-CM | POA: Diagnosis not present

## 2020-07-30 DIAGNOSIS — M25551 Pain in right hip: Secondary | ICD-10-CM

## 2020-07-30 DIAGNOSIS — R262 Difficulty in walking, not elsewhere classified: Secondary | ICD-10-CM

## 2020-07-30 DIAGNOSIS — R2681 Unsteadiness on feet: Secondary | ICD-10-CM | POA: Diagnosis not present

## 2020-07-30 NOTE — Therapy (Signed)
[facility] [address]  [place], [place], [address] Phone: [phone]   Fax:  [phone]  Physical Therapy Treatment  Patient Details  Name: NATALIJA MAVIS MRN: [id number] Date of Birth: [birth year] Referring Provider (PT): Rod Can, MD   Encounter Date: 07/30/2020   PT End of Session - 07/30/20 1612    Visit Number 6    Number of Visits 17    Date for PT Re-Evaluation 08/31/20    Authorization Type [facility]    PT Start Time 1612    PT Stop Time 1654    PT Time Calculation (min) 42 min    Activity Tolerance Patient tolerated treatment well    Behavior During Therapy [facility] for tasks assessed/performed           Past Medical History:  Diagnosis Date  . Anemia   . Anxiety   . Arthritis    L HIP  . Asthma   . Avascular necrosis of hip (HCC)    LEFT  . Back pain   . Chest pain   . Constipation   . Depression   . Diabetes mellitus   . Diabetes mellitus, type II ([place])   . Dry cough   . Edema, lower extremity   . Gait abnormality 11/18/2019  . GERD (gastroesophageal reflux disease)   . High cholesterol   . Hypertension   . Insomnia    takes Ambien nightly  . Joint pain   . Kidney disease   . Kidney disease   . Obesity   . Parkinsonism ([place]) 03/02/2017  . PONV (postoperative nausea and vomiting)   . RLS (restless legs syndrome) 01/15/2018  . Sleep apnea   . Swallowing difficulty   . Thyroid disease   . TIA (transient ischemic attack) 2012   no residual problems  . Urgency incontinence   . Vitamin D deficiency     Past Surgical History:  Procedure Laterality Date  . DILATION AND CURETTAGE OF UTERUS    . ESOPHAGEAL MANOMETRY N/A 09/03/2012   Procedure: ESOPHAGEAL MANOMETRY (EM);  Surgeon: Garlan Fair, MD;  [facility];  Service: Endoscopy;  Laterality: N/A;  . ESOPHAGEAL MANOMETRY N/A 06/19/2017   Procedure: ESOPHAGEAL MANOMETRY (EM);  Surgeon: Clarene Essex, MD;   [facility];  Service: Endoscopy;  Laterality: N/A;  . EXPLORATORY LAPAROTOMY    . GANGLION CYST EXCISION    . JOINT REPLACEMENT  2011   rt total hip  . KNEE ARTHROSCOPY    . PILONIDAL CYST / SINUS EXCISION    . TOTAL HIP ARTHROPLASTY Left 10/09/2013   Procedure: LEFT TOTAL HIP ARTHROPLASTY ANTERIOR APPROACH;  Surgeon: [name], MD;  [facility];  Service: Orthopedics;  Laterality: Left;    There were no vitals filed for this visit.   Subjective Assessment - 07/30/20 1615    Subjective No more falls since last visit. No issues with initial seated PWR! Moves. Increased hip pain today - unsure of trigger but states she was going up/down the stairs more than usual yesterday.    Pertinent History TIA, thyroid disease, Parkinsonism, kidney disease, HTN, HLD, GERD, DMII, depression, L hip avascular necrosis, anxiety, anemia, R THA 2011, L THA 2015, knee arthroscopy    Diagnostic tests per patient- R hip xray was clear    Patient Stated Goals decrease pain    Currently in Pain? Yes    Pain Score 7     Pain Location Hip  Pain Orientation Right;Lateral    Pain Descriptors / Indicators Sharp;Dull    Pain Type Acute pain    Pain Frequency Intermittent                             [facility] Adult PT Treatment/Exercise - 07/30/20 1612      Exercises   Exercises Knee/Hip      Knee/Hip Exercises: Stretches   Hip Flexor Stretch Right;2 reps;30 seconds    Hip Flexor Stretch Limitations seated lunge position over edge of chair    ITB Stretch Right;2 reps;30 seconds    ITB Stretch Limitations side sitting lunge position over edge of chair + L lateral lean over back of chair    Piriformis Stretch Right;2 reps;30 seconds    Piriformis Stretch Limitations side sitting with leg supported on mat table      Knee/Hip Exercises: Aerobic   Nustep L4 x 6 min (UEs/LEs)      Knee/Hip Exercises: Seated   Clamshell with TheraBand Red   2 x 10; cues to increase ROM on R             P[facility]) - 07/30/20 1612    PWR! exercises Moves in sitting    PWR! Sit to Stand 2x10    PWR! Up x10    PWR! Rock x10    PWR! Twist x10    PWR! Step x10                 PT Short Term Goals - 07/30/20 1620      PT SHORT TERM GOAL #1   Title Patient to be independent with initial HEP.    Status Achieved   07/30/20   Target Date 07/20/20             PT Long Term Goals - 07/15/20 1636      PT LONG TERM GOAL #1   Title Patient to be independent with advanced HEP.    Time 8    Period Weeks    Status On-going      PT LONG TERM GOAL #2   Title Patient to demonstrate R hip AROM WFL and without pain limiting.    Time 8    Period Weeks    Status On-going      PT LONG TERM GOAL #3   Title Patient to demonstrate R LE strength >/=4+/5.    Time 8    Period Weeks    Status On-going      PT LONG TERM GOAL #4   Title Patient to report tolerance of 1 hour on her feet without pain limiting.    Time 8    Period Weeks    Status On-going      PT LONG TERM GOAL #5   Title Patient to score 48/56 on Berg in order to decrease risk of falls.    Time 8    Period Weeks    Status On-going   07/15/20: 41/56                Plan - 07/30/20 1619    Clinical Impression Statement Tynisha reports increased R hip pain today with uncertain trigger other than possibly due to increased repetitions of up/down stairs yesterday. She reports limited completion of HEP due to reliance of her husband to assist with positioning for stretches, therefore reviewed all current HEP exercises providing guidance on modifications of positioning to decrease reliance on her  husband's assistance - STG met. Reviewed seated PWR! Moves targeting upright posture, bigger movement patterns and increased momentum to facilitate increased ease of mobility and promoted carryover into functional mobility with repetitive sit to stand w/o need for UE assist. Pt reports hip pain reduced to 3/10 by end  of session and decline offer of ionto patch.    Comorbidities TIA, thyroid disease, Parkinsonism, kidney disease, HTN, HLD, GERD, DMII, depression, L hip avascular necrosis, anxiety, anemia, R THA 2011, L THA 2015, knee arthroscopy    Rehab Potential Good    PT Frequency 2x / week    PT Duration 8 weeks    PT Treatment/Interventions ADLs/Self Care Home Management;Cryotherapy;Electrical Stimulation;Iontophoresis 4mg /ml Dexamethasone;Moist Heat;Balance training;Therapeutic exercise;Therapeutic activities;Functional mobility training;Stair training;Gait training;Ultrasound;Neuromuscular re-education;Patient/family education;Manual techniques;Taping;Energy conservation;Dry needling;Passive range of motion;Scar mobilization    PT Next Visit Plan progress R hip AROM and R LE strengthening to tolerance; STS transfers, balance training with narrow BOS; review seated PWR! moves & progress to other positions as indicated    Consulted and Agree with Plan of Care Patient           Patient will benefit from skilled therapeutic intervention in order to improve the following deficits and impairments:  Abnormal gait,Decreased activity tolerance,Decreased strength,Increased fascial restricitons,Pain,Decreased balance,Difficulty walking,Improper body mechanics,Decreased range of motion,Decreased safety awareness,Postural dysfunction,Impaired flexibility  Visit Diagnosis: Pain in right hip  Stiffness of right hip, not elsewhere classified  Muscle weakness (generalized)  Difficulty in walking, not elsewhere classified  Unsteadiness on feet     Problem List Patient Active Problem List   Diagnosis Date Noted  . Allergic conjunctivitis of both eyes 07/06/2020  . Gait abnormality 11/18/2019  . OSA on CPAP 06/04/2019  . Pain in joint of left shoulder 03/28/2019  . Seasonal allergic conjunctivitis 02/07/2019  . Chronic intermittent hypoxia with obstructive sleep apnea 01/25/2019  . Severe obstructive  sleep apnea 01/25/2019  . Excessive daytime sleepiness 01/03/2019  . Non-restorative sleep 01/03/2019  . Nocturia more than twice per night 01/03/2019  . Weight gain, abnormal 01/03/2019  . Snorings 01/03/2019  . Trochanteric bursitis of right hip 04/27/2018  . Encounter for routine screening for malformation using ultrasonics 02/08/2018  . Abnormal weight gain 02/08/2018  . Acid indigestion 02/08/2018  . Acute antritis 02/08/2018  . Class 2 severe obesity with serious comorbidity and body mass index (BMI) of 39.0 to 39.9 in adult ([place]) 02/08/2018  . Perennial allergic rhinitis 02/08/2018  . Antiphospholipid syndrome ([place]) 02/08/2018  . Arthralgia of hip or thigh 02/08/2018  . Asthma, cough variant 02/08/2018  . Vitamin D deficiency 02/08/2018  . Backhand tennis elbow 02/08/2018  . Cannot sleep 02/08/2018  . Chronic kidney disease (CKD), stage III (moderate) ([place]) 02/08/2018  . Current drug use 02/08/2018  . Depression, major, single episode, in partial remission ([place]) 02/08/2018  . Diabetes mellitus with polyneuropathy ([employer]) 02/08/2018  . Difficulty hearing 02/08/2018  . Disturbance of skin sensation 02/08/2018  . Hypertension associated with type 2 diabetes mellitus ([place]) 02/08/2018  . Extremity pain 02/08/2018  . Facet syndrome, lumbar 02/08/2018  . Factor V Leiden ([place]) 02/08/2018  . Fungal infection of nail 02/08/2018  . Gastroenteritis and colitis, viral 02/08/2018  . Hypoglycemia 02/08/2018  . Menopause 02/08/2018  . Other long term (current) drug therapy 02/08/2018  . Other osteonecrosis, unspecified femur ([place]) 02/08/2018  . Pure hypercholesterolemia 02/08/2018  . Recurrent major depressive episodes ([place]) 02/08/2018  . Syncope and collapse 02/08/2018  . Moderate persistent asthma 02/08/2018  . Cough, persistent  02/08/2018  . Restless leg syndrome 01/15/2018  . Degeneration of lumbar intervertebral disc 11/21/2017  . Tremor of right hand 03/02/2017  . Parkinson  disease ([place]) 03/02/2017  . UTI (urinary tract infection) 12/01/2015  . Type 2 diabetes mellitus with stage 3b chronic kidney disease, with long-term current use of insulin ([place]) 06/03/2015  . Fast heart beat 04/09/2015  . 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 2 diabetes mellitus ([place]) 04/24/2014  . Postoperative anemia due to acute blood loss 10/10/2013  . Avascular necrosis of bone of left hip ([place]) 10/09/2013  . Arthritis of pelvic region, degenerative 10/09/2013  . Breath shortness 06/18/2013  . Diabetes mellitus type 2, uncontrolled ([place]) 05/31/2013  . Intermittent claudication ([place]) 04/04/2013  . Anxiety   . Gastroesophageal reflux disease 09/12/2012  . Clinical depression 02/21/2012  . Stroke ([place]) 02/11/2012  . History of revision of total replacement of right hip joint 04/04/2009    Percival Spanish, PT, MPT 07/30/2020, 7:03 PM  [facility] [address]  [place], [place], [address] Phone: [phone]   Fax:  [phone]  Name: ASUNA PETH MRN: [id number] Date of Birth: December 02, 1955

## 2020-08-03 ENCOUNTER — Other Ambulatory Visit (HOSPITAL_BASED_OUTPATIENT_CLINIC_OR_DEPARTMENT_OTHER): Payer: Self-pay

## 2020-08-03 ENCOUNTER — Ambulatory Visit: Payer: 59 | Attending: Orthopedic Surgery | Admitting: Physical Therapy

## 2020-08-03 ENCOUNTER — Other Ambulatory Visit: Payer: Self-pay

## 2020-08-03 ENCOUNTER — Other Ambulatory Visit (HOSPITAL_BASED_OUTPATIENT_CLINIC_OR_DEPARTMENT_OTHER): Payer: Self-pay | Admitting: Psychiatry

## 2020-08-03 ENCOUNTER — Encounter: Payer: Self-pay | Admitting: Physical Therapy

## 2020-08-03 DIAGNOSIS — R2681 Unsteadiness on feet: Secondary | ICD-10-CM | POA: Diagnosis not present

## 2020-08-03 DIAGNOSIS — M25551 Pain in right hip: Secondary | ICD-10-CM | POA: Insufficient documentation

## 2020-08-03 DIAGNOSIS — R262 Difficulty in walking, not elsewhere classified: Secondary | ICD-10-CM | POA: Insufficient documentation

## 2020-08-03 DIAGNOSIS — M25651 Stiffness of right hip, not elsewhere classified: Secondary | ICD-10-CM | POA: Diagnosis not present

## 2020-08-03 DIAGNOSIS — M6281 Muscle weakness (generalized): Secondary | ICD-10-CM | POA: Diagnosis not present

## 2020-08-03 MED ORDER — BREXPIPRAZOLE 2 MG PO TABS
ORAL_TABLET | ORAL | 1 refills | Status: DC
  Filled 2020-08-03: qty 90, 90d supply, fill #0

## 2020-08-03 MED FILL — Solifenacin Succinate Tab 10 MG: ORAL | 30 days supply | Qty: 30 | Fill #1 | Status: AC

## 2020-08-03 NOTE — Therapy (Signed)
[facility] [address]  [place], [place], [address] Phone: [phone]   Fax:  [phone]  Physical Therapy Treatment  Patient Details  Name: Deanna Rowe MRN: [id number] Date of Birth: 10-15-1955 Referring Provider (PT): Rod Can, MD   Encounter Date: 08/03/2020   PT End of Session - 08/03/20 1619    Visit Number 7    Number of Visits 17    Date for PT Re-Evaluation 08/31/20    Authorization Type [facility]    PT Start Time 1619    PT Stop Time 1703    PT Time Calculation (min) 44 min    Activity Tolerance Patient tolerated treatment well    Behavior During Therapy [facility] for tasks assessed/performed           Past Medical History:  Diagnosis Date  . Anemia   . Anxiety   . Arthritis    L HIP  . Asthma   . Avascular necrosis of hip (HCC)    LEFT  . Back pain   . Chest pain   . Constipation   . Depression   . Diabetes mellitus   . Diabetes mellitus, type II ([place])   . Dry cough   . Edema, lower extremity   . Gait abnormality 11/18/2019  . GERD (gastroesophageal reflux disease)   . High cholesterol   . Hypertension   . Insomnia    takes Ambien nightly  . Joint pain   . Kidney disease   . Kidney disease   . Obesity   . Parkinsonism ([place]) 03/02/2017  . PONV (postoperative nausea and vomiting)   . RLS (restless legs syndrome) 01/15/2018  . Sleep apnea   . Swallowing difficulty   . Thyroid disease   . TIA (transient ischemic attack) 2012   no residual problems  . Urgency incontinence   . Vitamin D deficiency     Past Surgical History:  Procedure Laterality Date  . DILATION AND CURETTAGE OF UTERUS    . ESOPHAGEAL MANOMETRY N/A 09/03/2012   Procedure: ESOPHAGEAL MANOMETRY (EM);  Surgeon: Garlan Fair, MD;  [facility];  Service: Endoscopy;  Laterality: N/A;  . ESOPHAGEAL MANOMETRY N/A 06/19/2017   Procedure: ESOPHAGEAL MANOMETRY (EM);  Surgeon: Clarene Essex, MD;  [facility]:  [facility];  Service: Endoscopy;  Laterality: N/A;  . EXPLORATORY LAPAROTOMY    . GANGLION CYST EXCISION    . JOINT REPLACEMENT  2011   rt total hip  . KNEE ARTHROSCOPY    . PILONIDAL CYST / SINUS EXCISION    . TOTAL HIP ARTHROPLASTY Left 10/09/2013   Procedure: LEFT TOTAL HIP ARTHROPLASTY ANTERIOR APPROACH;  Surgeon: [name], MD;  [facility];  Service: Orthopedics;  Laterality: Left;    There were no vitals filed for this visit.   Subjective Assessment - 08/03/20 1621    Subjective Pt arrives to PT w/o her cane today stating she forgot it in her dtr's car. She denies any falls since last visit but states she did lose her balance yesterday in the bathroom landing hard against the wall and states the toilet kpet her from fully falling.    Pertinent History TIA, thyroid disease, Parkinsonism, kidney disease, HTN, HLD, GERD, DMII, depression, L hip avascular necrosis, anxiety, anemia, R THA 2011, L THA 2015, knee arthroscopy    Diagnostic tests per patient- R hip xray was clear    Patient Stated Goals decrease pain    Currently in  Pain? No/denies                             [facility] Adult PT Treatment/Exercise - 08/03/20 1619      Exercises   Exercises Knee/Hip      Knee/Hip Exercises: Aerobic   Nustep L4 x 6 min (UEs/LEs)             PWR [facility]) - 08/03/20 1619    PWR! exercises Moves in standing;Moves in sitting;Functional moves    PWR! Up 2x10    PWR! Rock 2x20    PWR! Twist 2x20    PWR Step 2x20    Comments back of chair facing pt for safety but not needed for UE support    PWR! Step Through Forward/Back 2x10 isolating fwd & back steps but not combining into step through   single UE support on back of chair   PWR! Sit to Stand 2x5    PWR! Up x10    PWR! Rock [employer]! Twist x10    PWR! Step x10               PT Education - 08/03/20 1703    Education Details Standing PWR! Moves added to [employer]) Educated Patient     Methods Explanation;Demonstration;Verbal cues;Handout    Comprehension Verbalized understanding;Verbal cues required;Returned demonstration;Need further instruction            PT Short Term Goals - 07/30/20 1620      PT SHORT TERM GOAL #1   Title Patient to be independent with initial HEP.    Status Achieved   07/30/20   Target Date 07/20/20             PT Long Term Goals - 07/15/20 1636      PT LONG TERM GOAL #1   Title Patient to be independent with advanced HEP.    Time 8    Period Weeks    Status On-going      PT LONG TERM GOAL #2   Title Patient to demonstrate R hip AROM WFL and without pain limiting.    Time 8    Period Weeks    Status On-going      PT LONG TERM GOAL #3   Title Patient to demonstrate R LE strength >/=4+/5.    Time 8    Period Weeks    Status On-going      PT LONG TERM GOAL #4   Title Patient to report tolerance of 1 hour on her feet without pain limiting.    Time 8    Period Weeks    Status On-going      PT LONG TERM GOAL #5   Title Patient to score 48/56 on Berg in order to decrease risk of falls.    Time 8    Period Weeks    Status On-going   07/15/20: 41/56                Plan - 08/03/20 1703    Clinical Impression Statement Hyun reports another near miss for a fall in her bathroom yesterday, landing hard against the wall but uncertain of what lead to her LOB. She does note increased ease of sit to stand transfers w/o need for UE assist and is looking forward to demonstrating this for Dr. Jannifer Franklin. She denies hip pain today, therefore progressed PWR! Moves introducing moves in standing as well  as forward and back stepping. Pt demonstrating more difficultly coordinating fwd and back steps and relying on UE support or SBA of PT, but able to complete standing PWR! Moves w/o need for UE support - standing moves added to HEP. Session concluded with review of seated PWR! Moves at pt request with PT allowing pt to use handout for  guidance with additional instruction as needed.    Comorbidities TIA, thyroid disease, Parkinsonism, kidney disease, HTN, HLD, GERD, DMII, depression, L hip avascular necrosis, anxiety, anemia, R THA 2011, L THA 2015, knee arthroscopy    Rehab Potential Good    PT Frequency 2x / week    PT Duration 8 weeks    PT Treatment/Interventions ADLs/Self Care Home Management;Cryotherapy;Electrical Stimulation;Iontophoresis 4mg /ml Dexamethasone;Moist Heat;Balance training;Therapeutic exercise;Therapeutic activities;Functional mobility training;Stair training;Gait training;Ultrasound;Neuromuscular re-education;Patient/family education;Manual techniques;Taping;Energy conservation;Dry needling;Passive range of motion;Scar mobilization    PT Next Visit Plan progress R hip AROM and R LE strengthening to tolerance; STS transfers, balance training with narrow BOS; review seated & standing PWR! moves & progress to other positions as indicated    Consulted and Agree with Plan of Care Patient           Patient will benefit from skilled therapeutic intervention in order to improve the following deficits and impairments:  Abnormal gait,Decreased activity tolerance,Decreased strength,Increased fascial restricitons,Pain,Decreased balance,Difficulty walking,Improper body mechanics,Decreased range of motion,Decreased safety awareness,Postural dysfunction,Impaired flexibility  Visit Diagnosis: Pain in right hip  Stiffness of right hip, not elsewhere classified  Muscle weakness (generalized)  Difficulty in walking, not elsewhere classified  Unsteadiness on feet     Problem List Patient Active Problem List   Diagnosis Date Noted  . Allergic conjunctivitis of both eyes 07/06/2020  . Gait abnormality 11/18/2019  . OSA on CPAP 06/04/2019  . Pain in joint of left shoulder 03/28/2019  . Seasonal allergic conjunctivitis 02/07/2019  . Chronic intermittent hypoxia with obstructive sleep apnea 01/25/2019  . Severe  obstructive sleep apnea 01/25/2019  . Excessive daytime sleepiness 01/03/2019  . Non-restorative sleep 01/03/2019  . Nocturia more than twice per night 01/03/2019  . Weight gain, abnormal 01/03/2019  . Snorings 01/03/2019  . Trochanteric bursitis of right hip 04/27/2018  . Encounter for routine screening for malformation using ultrasonics 02/08/2018  . Abnormal weight gain 02/08/2018  . Acid indigestion 02/08/2018  . Acute antritis 02/08/2018  . Class 2 severe obesity with serious comorbidity and body mass index (BMI) of 39.0 to 39.9 in adult ([place]) 02/08/2018  . Perennial allergic rhinitis 02/08/2018  . Antiphospholipid syndrome ([place]) 02/08/2018  . Arthralgia of hip or thigh 02/08/2018  . Asthma, cough variant 02/08/2018  . Vitamin D deficiency 02/08/2018  . Backhand tennis elbow 02/08/2018  . Cannot sleep 02/08/2018  . Chronic kidney disease (CKD), stage III (moderate) ([place]) 02/08/2018  . Current drug use 02/08/2018  . Depression, major, single episode, in partial remission ([place]) 02/08/2018  . Diabetes mellitus with polyneuropathy ([place]) 02/08/2018  . Difficulty hearing 02/08/2018  . Disturbance of skin sensation 02/08/2018  . Hypertension associated with type 2 diabetes mellitus ([place]) 02/08/2018  . Extremity pain 02/08/2018  . Facet syndrome, lumbar 02/08/2018  . Factor V Leiden ([place]) 02/08/2018  . Fungal infection of nail 02/08/2018  . Gastroenteritis and colitis, viral 02/08/2018  . Hypoglycemia 02/08/2018  . Menopause 02/08/2018  . Other long term (current) drug therapy 02/08/2018  . Other osteonecrosis, unspecified femur ([place]) 02/08/2018  . Pure hypercholesterolemia 02/08/2018  . Recurrent major depressive episodes ([place]) 02/08/2018  . Syncope and collapse  02/08/2018  . Moderate persistent asthma 02/08/2018  . Cough, persistent 02/08/2018  . Restless leg syndrome 01/15/2018  . Degeneration of lumbar intervertebral disc 11/21/2017  . Tremor of right hand 03/02/2017  .  Parkinson disease ([place]) 03/02/2017  . UTI (urinary tract infection) 12/01/2015  . Type 2 diabetes mellitus with stage 3b chronic kidney disease, with long-term current use of insulin ([place]) 06/03/2015  . Fast heart beat 04/09/2015  . 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 2 diabetes mellitus ([place]) 04/24/2014  . Postoperative anemia due to acute blood loss 10/10/2013  . Avascular necrosis of bone of left hip ([place]) 10/09/2013  . Arthritis of pelvic region, degenerative 10/09/2013  . Breath shortness 06/18/2013  . Diabetes mellitus type 2, uncontrolled ([place]) 05/31/2013  . Intermittent claudication ([place]) 04/04/2013  . Anxiety   . Gastroesophageal reflux disease 09/12/2012  . Clinical depression 02/21/2012  . Stroke ([place]) 02/11/2012  . History of revision of total replacement of right hip joint 04/04/2009    Percival Spanish, PT, MPT 08/03/2020, 5:18 PM  [facility] [address]  [place], [place], [address] Phone: [phone]   Fax:  [phone]  Name: Deanna Rowe MRN: [id number] Date of Birth: 02/24/1956

## 2020-08-05 ENCOUNTER — Other Ambulatory Visit (HOSPITAL_BASED_OUTPATIENT_CLINIC_OR_DEPARTMENT_OTHER): Payer: Self-pay

## 2020-08-06 ENCOUNTER — Ambulatory Visit: Payer: 59 | Admitting: Physical Therapy

## 2020-08-06 ENCOUNTER — Other Ambulatory Visit (HOSPITAL_BASED_OUTPATIENT_CLINIC_OR_DEPARTMENT_OTHER): Payer: Self-pay

## 2020-08-06 ENCOUNTER — Other Ambulatory Visit: Payer: Self-pay

## 2020-08-06 ENCOUNTER — Encounter: Payer: Self-pay | Admitting: Physical Therapy

## 2020-08-06 DIAGNOSIS — R262 Difficulty in walking, not elsewhere classified: Secondary | ICD-10-CM

## 2020-08-06 DIAGNOSIS — M6281 Muscle weakness (generalized): Secondary | ICD-10-CM | POA: Diagnosis not present

## 2020-08-06 DIAGNOSIS — M25651 Stiffness of right hip, not elsewhere classified: Secondary | ICD-10-CM

## 2020-08-06 DIAGNOSIS — R2681 Unsteadiness on feet: Secondary | ICD-10-CM | POA: Diagnosis not present

## 2020-08-06 DIAGNOSIS — M25551 Pain in right hip: Secondary | ICD-10-CM | POA: Diagnosis not present

## 2020-08-06 MED FILL — Semaglutide Soln Pen-inj 0.25 or 0.5 MG/DOSE (2 MG/1.5ML): SUBCUTANEOUS | 84 days supply | Qty: 4.5 | Fill #0 | Status: AC

## 2020-08-06 NOTE — Therapy (Signed)
[facility] [address]  [place], [place], [address] Phone: [phone]   Fax:  [phone]  Physical Therapy Treatment  Patient Details  Name: Deanna Rowe MRN: [id number] Date of Birth: 1955-04-26 Referring Provider (PT): Rod Can, MD   Encounter Date: 08/06/2020   PT End of Session - 08/06/20 1703    Visit Number 8    Number of Visits 17    Date for PT Re-Evaluation 08/31/20    Authorization Type [facility]    PT Start Time 1627   pt late   PT Stop Time 1701    PT Time Calculation (min) 34 min    Activity Tolerance Patient tolerated treatment well    Behavior During Therapy [facility] for tasks assessed/performed           Past Medical History:  Diagnosis Date  . Anemia   . Anxiety   . Arthritis    L HIP  . Asthma   . Avascular necrosis of hip (HCC)    LEFT  . Back pain   . Chest pain   . Constipation   . Depression   . Diabetes mellitus   . Diabetes mellitus, type II ([place])   . Dry cough   . Edema, lower extremity   . Gait abnormality 11/18/2019  . GERD (gastroesophageal reflux disease)   . High cholesterol   . Hypertension   . Insomnia    takes Ambien nightly  . Joint pain   . Kidney disease   . Kidney disease   . Obesity   . Parkinsonism ([place]) 03/02/2017  . PONV (postoperative nausea and vomiting)   . RLS (restless legs syndrome) 01/15/2018  . Sleep apnea   . Swallowing difficulty   . Thyroid disease   . TIA (transient ischemic attack) 2012   no residual problems  . Urgency incontinence   . Vitamin D deficiency     Past Surgical History:  Procedure Laterality Date  . DILATION AND CURETTAGE OF UTERUS    . ESOPHAGEAL MANOMETRY N/A 09/03/2012   Procedure: ESOPHAGEAL MANOMETRY (EM);  Surgeon: Garlan Fair, MD;  [facility];  Service: Endoscopy;  Laterality: N/A;  . ESOPHAGEAL MANOMETRY N/A 06/19/2017   Procedure: ESOPHAGEAL MANOMETRY (EM);  Surgeon: Clarene Essex, MD;   [facility];  Service: Endoscopy;  Laterality: N/A;  . EXPLORATORY LAPAROTOMY    . GANGLION CYST EXCISION    . JOINT REPLACEMENT  2011   rt total hip  . KNEE ARTHROSCOPY    . PILONIDAL CYST / SINUS EXCISION    . TOTAL HIP ARTHROPLASTY Left 10/09/2013   Procedure: LEFT TOTAL HIP ARTHROPLASTY ANTERIOR APPROACH;  Surgeon: [name], MD;  [facility];  Service: Orthopedics;  Laterality: Left;    There were no vitals filed for this visit.   Subjective Assessment - 08/06/20 1629    Subjective Denies falls recently. Hi has been giving her a fit- having trouble sleeping.    Pertinent History TIA, thyroid disease, Parkinsonism, kidney disease, HTN, HLD, GERD, DMII, depression, L hip avascular necrosis, anxiety, anemia, R THA 2011, L THA 2015, knee arthroscopy    Diagnostic tests per patient- R hip xray was clear    Patient Stated Goals decrease pain    Currently in Pain? Yes    Pain Score 5     Pain Location Hip    Pain Orientation Right;Lateral    Pain Descriptors / Indicators Sharp;Dull  Pain Type Acute pain                             [facility] Adult PT Treatment/Exercise - 08/06/20 0001      Self-Care   Self-Care Other Self-Care Comments    Other Self-Care Comments  practice and edu on use of self-STM wiht ball on wal to R buttock      Knee/Hip Exercises: Stretches   Piriformis Stretch Right;1 rep;30 seconds    Piriformis Stretch Limitations supine fig 4      Knee/Hip Exercises: Aerobic   Nustep L4 x 4 min (UEs/LEs)      Iontophoresis   Type of Iontophoresis Dexamethasone   #2/6   Location R lateral hip    Dose 1.73ml; 15mA*minutes    Time 4 hour patch      Manual Therapy   Manual Therapy Soft tissue mobilization;Myofascial release;Passive ROM    Manual therapy comments L sidelying    Soft tissue mobilization STM to R glute med and piriformis    Myofascial Release manual TPR to R glute med and piriformis   TTP throughout and with painful  trigger pts evident   Passive ROM R TFL stretch 2x30", R KTOS 2x30", R fig 4 30"   good tolerance                 PT Education - 08/06/20 1702    Education Details update to HEP to be done wiht husband's assistance for stretching; reminder on ionto wear time and removal    Person(s) Educated Patient    Methods Explanation;Demonstration;Tactile cues;Verbal cues;Handout    Comprehension Verbalized understanding;Returned demonstration            PT Short Term Goals - 07/30/20 1620      PT SHORT TERM GOAL #1   Title Patient to be independent with initial HEP.    Status Achieved   07/30/20   Target Date 07/20/20             PT Long Term Goals - 07/15/20 1636      PT LONG TERM GOAL #1   Title Patient to be independent with advanced HEP.    Time 8    Period Weeks    Status On-going      PT LONG TERM GOAL #2   Title Patient to demonstrate R hip AROM WFL and without pain limiting.    Time 8    Period Weeks    Status On-going      PT LONG TERM GOAL #3   Title Patient to demonstrate R LE strength >/=4+/5.    Time 8    Period Weeks    Status On-going      PT LONG TERM GOAL #4   Title Patient to report tolerance of 1 hour on her feet without pain limiting.    Time 8    Period Weeks    Status On-going      PT LONG TERM GOAL #5   Title Patient to score 48/56 on Berg in order to decrease risk of falls.    Time 8    Period Weeks    Status On-going   07/15/20: 41/56                Plan - 08/06/20 1703    Clinical Impression Statement Patient denies falls since last session. Notes trouble sleeping d/t her R hip pain. Performed STM and TPR to R  glute med and piriformis, with patient reporting tenderness throughout and with soft tissue restriction in these muscle groups. Followed with passive LE stretching, after which patient reporting good improvement in hip pain, down to 2/10. Educated patient on self-STM to the R buttock for continued pain relief at home and  updated HEP with stretching to be performed with husband's assistance for max tolerable stretch. Patient agreeable to ionto patch application again today d/t benefit from this at previous session. No complaints at end of session.    Comorbidities TIA, thyroid disease, Parkinsonism, kidney disease, HTN, HLD, GERD, DMII, depression, L hip avascular necrosis, anxiety, anemia, R THA 2011, L THA 2015, knee arthroscopy    Rehab Potential Good    PT Frequency 2x / week    PT Duration 8 weeks    PT Treatment/Interventions ADLs/Self Care Home Management;Cryotherapy;Electrical Stimulation;Iontophoresis 4mg /ml Dexamethasone;Moist Heat;Balance training;Therapeutic exercise;Therapeutic activities;Functional mobility training;Stair training;Gait training;Ultrasound;Neuromuscular re-education;Patient/family education;Manual techniques;Taping;Energy conservation;Dry needling;Passive range of motion;Scar mobilization    PT Next Visit Plan progress R hip AROM and R LE strengthening to tolerance; STS transfers, balance training with narrow BOS; review seated & standing PWR! moves & progress to other positions as indicated    Consulted and Agree with Plan of Care Patient           Patient will benefit from skilled therapeutic intervention in order to improve the following deficits and impairments:  Abnormal gait,Decreased activity tolerance,Decreased strength,Increased fascial restricitons,Pain,Decreased balance,Difficulty walking,Improper body mechanics,Decreased range of motion,Decreased safety awareness,Postural dysfunction,Impaired flexibility  Visit Diagnosis: Pain in right hip  Stiffness of right hip, not elsewhere classified  Muscle weakness (generalized)  Difficulty in walking, not elsewhere classified  Unsteadiness on feet     Problem List Patient Active Problem List   Diagnosis Date Noted  . Allergic conjunctivitis of both eyes 07/06/2020  . Gait abnormality 11/18/2019  . OSA on CPAP  06/04/2019  . Pain in joint of left shoulder 03/28/2019  . Seasonal allergic conjunctivitis 02/07/2019  . Chronic intermittent hypoxia with obstructive sleep apnea 01/25/2019  . Severe obstructive sleep apnea 01/25/2019  . Excessive daytime sleepiness 01/03/2019  . Non-restorative sleep 01/03/2019  . Nocturia more than twice per night 01/03/2019  . Weight gain, abnormal 01/03/2019  . Snorings 01/03/2019  . Trochanteric bursitis of right hip 04/27/2018  . Encounter for routine screening for malformation using ultrasonics 02/08/2018  . Abnormal weight gain 02/08/2018  . Acid indigestion 02/08/2018  . Acute antritis 02/08/2018  . Class 2 severe obesity with serious comorbidity and body mass index (BMI) of 39.0 to 39.9 in adult ([place]) 02/08/2018  . Perennial allergic rhinitis 02/08/2018  . Antiphospholipid syndrome ([place]) 02/08/2018  . Arthralgia of hip or thigh 02/08/2018  . Asthma, cough variant 02/08/2018  . Vitamin D deficiency 02/08/2018  . Backhand tennis elbow 02/08/2018  . Cannot sleep 02/08/2018  . Chronic kidney disease (CKD), stage III (moderate) ([place]) 02/08/2018  . Current drug use 02/08/2018  . Depression, major, single episode, in partial remission ([place]) 02/08/2018  . Diabetes mellitus with polyneuropathy ([place]) 02/08/2018  . Difficulty hearing 02/08/2018  . Disturbance of skin sensation 02/08/2018  . Hypertension associated with type 2 diabetes mellitus ([place]) 02/08/2018  . Extremity pain 02/08/2018  . Facet syndrome, lumbar 02/08/2018  . Factor V Leiden ([place]) 02/08/2018  . Fungal infection of nail 02/08/2018  . Gastroenteritis and colitis, viral 02/08/2018  . Hypoglycemia 02/08/2018  . Menopause 02/08/2018  . Other long term (current) drug therapy 02/08/2018  . Other osteonecrosis, unspecified femur ([place])  02/08/2018  . Pure hypercholesterolemia 02/08/2018  . Recurrent major depressive episodes ([place]) 02/08/2018  . Syncope and collapse 02/08/2018  . Moderate persistent  asthma 02/08/2018  . Cough, persistent 02/08/2018  . Restless leg syndrome 01/15/2018  . Degeneration of lumbar intervertebral disc 11/21/2017  . Tremor of right hand 03/02/2017  . Parkinson disease ([place]) 03/02/2017  . UTI (urinary tract infection) 12/01/2015  . Type 2 diabetes mellitus with stage 3b chronic kidney disease, with long-term current use of insulin ([place]) 06/03/2015  . Fast heart beat 04/09/2015  . 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 2 diabetes mellitus ([place]) 04/24/2014  . Postoperative anemia due to acute blood loss 10/10/2013  . Avascular necrosis of bone of left hip ([place]) 10/09/2013  . Arthritis of pelvic region, degenerative 10/09/2013  . Breath shortness 06/18/2013  . Diabetes mellitus type 2, uncontrolled ([place]) 05/31/2013  . Intermittent claudication ([place]) 04/04/2013  . Anxiety   . Gastroesophageal reflux disease 09/12/2012  . Clinical depression 02/21/2012  . Stroke ([place]) 02/11/2012  . History of revision of total replacement of right hip joint 04/04/2009     Janene Harvey, PT, DPT 08/06/20 5:08 PM   [facility] [address]  [place], [place], [address] Phone: [phone]   Fax:  [phone]  Name: VARETTA CHAVERS MRN: [id number] Date of Birth: Jul 09, 1955

## 2020-08-10 ENCOUNTER — Other Ambulatory Visit (HOSPITAL_BASED_OUTPATIENT_CLINIC_OR_DEPARTMENT_OTHER): Payer: Self-pay

## 2020-08-10 ENCOUNTER — Other Ambulatory Visit (HOSPITAL_BASED_OUTPATIENT_CLINIC_OR_DEPARTMENT_OTHER): Payer: Self-pay | Admitting: Urology

## 2020-08-10 ENCOUNTER — Other Ambulatory Visit: Payer: Self-pay

## 2020-08-10 ENCOUNTER — Other Ambulatory Visit (HOSPITAL_COMMUNITY): Payer: Self-pay

## 2020-08-10 ENCOUNTER — Ambulatory Visit (INDEPENDENT_AMBULATORY_CARE_PROVIDER_SITE_OTHER): Payer: 59 | Admitting: Family Medicine

## 2020-08-10 MED ORDER — LOSARTAN POTASSIUM-HCTZ 100-12.5 MG PO TABS
ORAL_TABLET | ORAL | 0 refills | Status: DC
  Filled 2020-08-10: qty 90, 90d supply, fill #0

## 2020-08-10 MED FILL — Vortioxetine HBr Tab 20 MG (Base Equiv): ORAL | 90 days supply | Qty: 90 | Fill #0 | Status: AC

## 2020-08-11 ENCOUNTER — Encounter: Payer: Self-pay | Admitting: Physical Therapy

## 2020-08-11 ENCOUNTER — Other Ambulatory Visit: Payer: Self-pay

## 2020-08-11 ENCOUNTER — Other Ambulatory Visit (HOSPITAL_BASED_OUTPATIENT_CLINIC_OR_DEPARTMENT_OTHER): Payer: Self-pay | Admitting: Urology

## 2020-08-11 ENCOUNTER — Other Ambulatory Visit (HOSPITAL_BASED_OUTPATIENT_CLINIC_OR_DEPARTMENT_OTHER): Payer: Self-pay

## 2020-08-11 ENCOUNTER — Ambulatory Visit: Payer: 59 | Admitting: Physical Therapy

## 2020-08-11 DIAGNOSIS — M6281 Muscle weakness (generalized): Secondary | ICD-10-CM

## 2020-08-11 DIAGNOSIS — R2681 Unsteadiness on feet: Secondary | ICD-10-CM | POA: Diagnosis not present

## 2020-08-11 DIAGNOSIS — M25651 Stiffness of right hip, not elsewhere classified: Secondary | ICD-10-CM | POA: Diagnosis not present

## 2020-08-11 DIAGNOSIS — M25551 Pain in right hip: Secondary | ICD-10-CM | POA: Diagnosis not present

## 2020-08-11 DIAGNOSIS — R310 Gross hematuria: Secondary | ICD-10-CM | POA: Diagnosis not present

## 2020-08-11 DIAGNOSIS — R262 Difficulty in walking, not elsewhere classified: Secondary | ICD-10-CM

## 2020-08-11 NOTE — Therapy (Signed)
[facility] [address]  [place], [place], [address] Phone: [phone]   Fax:  [phone]  Physical Therapy Treatment  Patient Details  Name: Deanna Rowe MRN: [id number] Date of Birth: March 06, 1956 Referring Provider (PT): Rod Can, MD   Encounter Date: 08/11/2020   PT End of Session - 08/11/20 1625    Visit Number 9    Number of Visits 17    Date for PT Re-Evaluation 08/31/20    Authorization Type [facility]    PT Start Time 1625    PT Stop Time 1711    PT Time Calculation (min) 46 min    Activity Tolerance Patient tolerated treatment well    Behavior During Therapy [facility] for tasks assessed/performed           Past Medical History:  Diagnosis Date  . Anemia   . Anxiety   . Arthritis    L HIP  . Asthma   . Avascular necrosis of hip (HCC)    LEFT  . Back pain   . Chest pain   . Constipation   . Depression   . Diabetes mellitus   . Diabetes mellitus, type II ([place])   . Dry cough   . Edema, lower extremity   . Gait abnormality 11/18/2019  . GERD (gastroesophageal reflux disease)   . High cholesterol   . Hypertension   . Insomnia    takes Ambien nightly  . Joint pain   . Kidney disease   . Kidney disease   . Obesity   . Parkinsonism ([place]) 03/02/2017  . PONV (postoperative nausea and vomiting)   . RLS (restless legs syndrome) 01/15/2018  . Sleep apnea   . Swallowing difficulty   . Thyroid disease   . TIA (transient ischemic attack) 2012   no residual problems  . Urgency incontinence   . Vitamin D deficiency     Past Surgical History:  Procedure Laterality Date  . DILATION AND CURETTAGE OF UTERUS    . ESOPHAGEAL MANOMETRY N/A 09/03/2012   Procedure: ESOPHAGEAL MANOMETRY (EM);  Surgeon: Garlan Fair, MD;  Location: [facility];  Service: Endoscopy;  Laterality: N/A;  . ESOPHAGEAL MANOMETRY N/A 06/19/2017   Procedure: ESOPHAGEAL MANOMETRY (EM);  Surgeon: Clarene Essex, MD;   Location: [facility];  Service: Endoscopy;  Laterality: N/A;  . EXPLORATORY LAPAROTOMY    . GANGLION CYST EXCISION    . JOINT REPLACEMENT  2011   rt total hip  . KNEE ARTHROSCOPY    . PILONIDAL CYST / SINUS EXCISION    . TOTAL HIP ARTHROPLASTY Left 10/09/2013   Procedure: LEFT TOTAL HIP ARTHROPLASTY ANTERIOR APPROACH;  Surgeon: [name], MD;  Location: [facility];  Service: Orthopedics;  Laterality: Left;    There were no vitals filed for this visit.   Subjective Assessment - 08/11/20 1630    Subjective Pt reports increased pain over the weekend since stretches last visit, but no pain currently. Has not attempted new HEP stretches d/t the increased pain. No new falls since last visit.    Pertinent History TIA, thyroid disease, Parkinsonism, kidney disease, HTN, HLD, GERD, DMII, depression, L hip avascular necrosis, anxiety, anemia, R THA 2011, L THA 2015, knee arthroscopy    Diagnostic tests per patient- R hip xray was clear    Patient Stated Goals decrease pain    Currently in Pain? No/denies  [place] Adult PT Treatment/Exercise - 08/11/20 1625      Exercises   Exercises Knee/Hip      Knee/Hip Exercises: Stretches   ITB Stretch Right;2 reps;30 seconds    ITB Stretch Limitations sidelying with leg extended back over edge of table - cues to avoid rolling onto back    Piriformis Stretch Right;2 reps;30 seconds    Piriformis Stretch Limitations supine KTOS      Knee/Hip Exercises: Aerobic   Nustep L4 x 6 min (UEs/LEs)      Knee/Hip Exercises: Standing   Hip Flexion Right;Left;1 set;10 reps;Stengthening;Knee bent    Hip Flexion Limitations looped yellow TB march;; UE support on back of chair for balance    Hip Abduction Right;Left;1 set;10 reps;Stengthening;Knee straight    Abduction Limitations looped yellow TB at ankles; UE support on back of chair for balance    Hip Extension Right;Left;1 set;10 reps;Stengthening;Knee straight     Extension Limitations looped yellow TB at ankles; UE support on back of chair for balance      Knee/Hip Exercises: Seated   Long Arc Quad Right;2 sets;10 reps    Long Arc Quad Limitations + hip ADD ball squeeze    Clamshell with TheraBand Red   2 x 10; 1st set Bilateral; 2nd set with alt hip ABD/ER   Hamstring Curl Right;10 reps;Strengthening    Hamstring Limitations looped red TB at ankles             [facility]) - 08/11/20 1625    PWR! exercises Moves in standing    PWR! Up x10    PWR! [employer]! Twist x20    PWR Step x20                 PT Short Term Goals - 07/30/20 1620      PT SHORT TERM GOAL #1   Title Patient to be independent with initial HEP.    Status Achieved   07/30/20   Target Date 07/20/20             PT Long Term Goals - 07/15/20 1636      PT LONG TERM GOAL #1   Title Patient to be independent with advanced HEP.    Time 8    Period Weeks    Status On-going      PT LONG TERM GOAL #2   Title Patient to demonstrate R hip AROM WFL and without pain limiting.    Time 8    Period Weeks    Status On-going      PT LONG TERM GOAL #3   Title Patient to demonstrate R LE strength >/=4+/5.    Time 8    Period Weeks    Status On-going      PT LONG TERM GOAL #4   Title Patient to report tolerance of 1 hour on her feet without pain limiting.    Time 8    Period Weeks    Status On-going      PT LONG TERM GOAL #5   Title Patient to score 48/56 on [place] in order to decrease risk of falls.    Time 8    Period Weeks    Status On-going   07/15/20: 41/56                Plan - 08/11/20 1633    Clinical Impression Statement Deanna Rowe reports increased pain following stretches during last visit that lasted through the weekend preventing her  from attempting the new HEP stretches or the standing PWR! Moves. Reviewed the new stretches with pt reporting good tolerance and no increased pain today. Also reviewed standing PWR! Moves with improved  quality of movement noted but tendency noted for uneven weightbearing with decreased weight shift to R for most movements. Progressed proximal R LE strengthening in standing to encouraged improved stability on R with no increased pain reported but deferred HEP update to allow pt opportunity to work on recent HEP updates before adding new activities. Susanna remained pain free with no concerns at end of visit.    Comorbidities TIA, thyroid disease, Parkinsonism, kidney disease, HTN, HLD, GERD, DMII, depression, L hip avascular necrosis, anxiety, anemia, R THA 2011, L THA 2015, knee arthroscopy    Rehab Potential Good    PT Frequency 2x / week    PT Duration 8 weeks    PT Treatment/Interventions ADLs/Self Care Home Management;Cryotherapy;Electrical Stimulation;Iontophoresis 4mg /ml Dexamethasone;Moist Heat;Balance training;Therapeutic exercise;Therapeutic activities;Functional mobility training;Stair training;Gait training;Ultrasound;Neuromuscular re-education;Patient/family education;Manual techniques;Taping;Energy conservation;Dry needling;Passive range of motion;Scar mobilization    PT Next Visit Plan 10th visit PN; progress R hip AROM and R LE strengthening to tolerance; STS transfers, balance training with narrow BOS; review seated & standing PWR! moves & progress to other positions as indicated    Consulted and Agree with Plan of Care Patient           Patient will benefit from skilled therapeutic intervention in order to improve the following deficits and impairments:  Abnormal gait,Decreased activity tolerance,Decreased strength,Increased fascial restricitons,Pain,Decreased balance,Difficulty walking,Improper body mechanics,Decreased range of motion,Decreased safety awareness,Postural dysfunction,Impaired flexibility  Visit Diagnosis: Pain in right hip  Stiffness of right hip, not elsewhere classified  Muscle weakness (generalized)  Difficulty in walking, not elsewhere  classified  Unsteadiness on feet     Problem List Patient Active Problem List   Diagnosis Date Noted  . Allergic conjunctivitis of both eyes 07/06/2020  . Gait abnormality 11/18/2019  . OSA on CPAP 06/04/2019  . Pain in joint of left shoulder 03/28/2019  . Seasonal allergic conjunctivitis 02/07/2019  . Chronic intermittent hypoxia with obstructive sleep apnea 01/25/2019  . Severe obstructive sleep apnea 01/25/2019  . Excessive daytime sleepiness 01/03/2019  . Non-restorative sleep 01/03/2019  . Nocturia more than twice per night 01/03/2019  . Weight gain, abnormal 01/03/2019  . Snorings 01/03/2019  . Trochanteric bursitis of right hip 04/27/2018  . Encounter for routine screening for malformation using ultrasonics 02/08/2018  . Abnormal weight gain 02/08/2018  . Acid indigestion 02/08/2018  . Acute antritis 02/08/2018  . Class 2 severe obesity with serious comorbidity and body mass index (BMI) of 39.0 to 39.9 in adult ([place]) 02/08/2018  . Perennial allergic rhinitis 02/08/2018  . Antiphospholipid syndrome ([place]) 02/08/2018  . Arthralgia of hip or thigh 02/08/2018  . Asthma, cough variant 02/08/2018  . Vitamin D deficiency 02/08/2018  . Backhand tennis elbow 02/08/2018  . Cannot sleep 02/08/2018  . Chronic kidney disease (CKD), stage III (moderate) ([place]) 02/08/2018  . Current drug use 02/08/2018  . Depression, major, single episode, in partial remission ([place]) 02/08/2018  . Diabetes mellitus with polyneuropathy ([place]) 02/08/2018  . Difficulty hearing 02/08/2018  . Disturbance of skin sensation 02/08/2018  . Hypertension associated with type 2 diabetes mellitus ([place]) 02/08/2018  . Extremity pain 02/08/2018  . Facet syndrome, lumbar 02/08/2018  . Factor V Leiden ([place]) 02/08/2018  . Fungal infection of nail 02/08/2018  . Gastroenteritis and colitis, viral 02/08/2018  . Hypoglycemia 02/08/2018  . Menopause 02/08/2018  .  Other long term (current) drug therapy 02/08/2018  .  Other osteonecrosis, unspecified femur ([place]) 02/08/2018  . Pure hypercholesterolemia 02/08/2018  . Recurrent major depressive episodes ([place]) 02/08/2018  . Syncope and collapse 02/08/2018  . Moderate persistent asthma 02/08/2018  . Cough, persistent 02/08/2018  . Restless leg syndrome 01/15/2018  . Degeneration of lumbar intervertebral disc 11/21/2017  . Tremor of right hand 03/02/2017  . Parkinson disease ([place]) 03/02/2017  . UTI (urinary tract infection) 12/01/2015  . Type 2 diabetes mellitus with stage 3b chronic kidney disease, with long-term current use of insulin ([place]) 06/03/2015  . Fast heart beat 04/09/2015  . 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 2 diabetes mellitus ([place]) 04/24/2014  . Postoperative anemia due to acute blood loss 10/10/2013  . Avascular necrosis of bone of left hip ([place]) 10/09/2013  . Arthritis of pelvic region, degenerative 10/09/2013  . Breath shortness 06/18/2013  . Diabetes mellitus type 2, uncontrolled ([place]) 05/31/2013  . Intermittent claudication ([place]) 04/04/2013  . Anxiety   . Gastroesophageal reflux disease 09/12/2012  . Clinical depression 02/21/2012  . Stroke ([place]) 02/11/2012  . History of revision of total replacement of right hip joint 04/04/2009    Percival Spanish, PT, MPT 08/11/2020, 5:43 PM  [facility] [address]  [place], [place], [address] Phone: [phone]   Fax:  [phone]  Name: SAMAYRA HEBEL MRN: [id number] Date of Birth: Jul 08, 1955

## 2020-08-12 ENCOUNTER — Other Ambulatory Visit (HOSPITAL_BASED_OUTPATIENT_CLINIC_OR_DEPARTMENT_OTHER): Payer: Self-pay

## 2020-08-12 DIAGNOSIS — Z20822 Contact with and (suspected) exposure to covid-19: Secondary | ICD-10-CM | POA: Diagnosis not present

## 2020-08-13 ENCOUNTER — Ambulatory Visit: Payer: 59 | Admitting: Physical Therapy

## 2020-08-13 ENCOUNTER — Other Ambulatory Visit (HOSPITAL_BASED_OUTPATIENT_CLINIC_OR_DEPARTMENT_OTHER): Payer: Self-pay | Admitting: Urology

## 2020-08-13 ENCOUNTER — Other Ambulatory Visit: Payer: Self-pay

## 2020-08-13 ENCOUNTER — Other Ambulatory Visit (HOSPITAL_BASED_OUTPATIENT_CLINIC_OR_DEPARTMENT_OTHER): Payer: Self-pay

## 2020-08-13 ENCOUNTER — Encounter: Payer: Self-pay | Admitting: Physical Therapy

## 2020-08-13 DIAGNOSIS — M25651 Stiffness of right hip, not elsewhere classified: Secondary | ICD-10-CM | POA: Diagnosis not present

## 2020-08-13 DIAGNOSIS — R262 Difficulty in walking, not elsewhere classified: Secondary | ICD-10-CM

## 2020-08-13 DIAGNOSIS — M25551 Pain in right hip: Secondary | ICD-10-CM

## 2020-08-13 DIAGNOSIS — Z20822 Contact with and (suspected) exposure to covid-19: Secondary | ICD-10-CM | POA: Diagnosis not present

## 2020-08-13 DIAGNOSIS — R2681 Unsteadiness on feet: Secondary | ICD-10-CM | POA: Diagnosis not present

## 2020-08-13 DIAGNOSIS — M6281 Muscle weakness (generalized): Secondary | ICD-10-CM | POA: Diagnosis not present

## 2020-08-13 MED ORDER — MYRBETRIQ 25 MG PO TB24
ORAL_TABLET | ORAL | 6 refills | Status: DC
  Filled 2020-08-13: qty 30, 30d supply, fill #0
  Filled 2020-09-02 – 2020-09-03 (×2): qty 30, 30d supply, fill #1

## 2020-08-13 NOTE — Therapy (Signed)
[facility] [address]  [place], [place], [address] Phone: [phone]   Fax:  [phone]  Physical Therapy Treatment / Progress Note  Patient Details  Name: JAHMYA ONOFRIO MRN: [id number] Date of Birth: Aug 06, 1955 Referring Provider (PT): Rod Can, MD  Progress Note  Reporting Period 07/06/2020 to 08/13/2020  See note below for Objective Data and Assessment of Progress/Goals.      Encounter Date: 08/13/2020   PT End of Session - 08/13/20 1702    Visit Number 10    Number of Visits 17    Date for PT Re-Evaluation 08/31/20    Authorization Type [facility]    PT Start Time 1702    PT Stop Time 1754    PT Time Calculation (min) 52 min    Activity Tolerance Patient tolerated treatment well    Behavior During Therapy WFL for tasks assessed/performed           Past Medical History:  Diagnosis Date  . Anemia   . Anxiety   . Arthritis    L HIP  . Asthma   . Avascular necrosis of hip (HCC)    LEFT  . Back pain   . Chest pain   . Constipation   . Depression   . Diabetes mellitus   . Diabetes mellitus, type II ([place])   . Dry cough   . Edema, lower extremity   . Gait abnormality 11/18/2019  . GERD (gastroesophageal reflux disease)   . High cholesterol   . Hypertension   . Insomnia    takes Ambien nightly  . Joint pain   . Kidney disease   . Kidney disease   . Obesity   . Parkinsonism ([place]) 03/02/2017  . PONV (postoperative nausea and vomiting)   . RLS (restless legs syndrome) 01/15/2018  . Sleep apnea   . Swallowing difficulty   . Thyroid disease   . TIA (transient ischemic attack) 2012   no residual problems  . Urgency incontinence   . Vitamin D deficiency     Past Surgical History:  Procedure Laterality Date  . DILATION AND CURETTAGE OF UTERUS    . ESOPHAGEAL MANOMETRY N/A 09/03/2012   Procedure: ESOPHAGEAL MANOMETRY (EM);  Surgeon: Garlan Fair, MD;  Location: [facility];   Service: Endoscopy;  Laterality: N/A;  . ESOPHAGEAL MANOMETRY N/A 06/19/2017   Procedure: ESOPHAGEAL MANOMETRY (EM);  Surgeon: Clarene Essex, MD;  Location: [facility];  Service: Endoscopy;  Laterality: N/A;  . EXPLORATORY LAPAROTOMY    . GANGLION CYST EXCISION    . JOINT REPLACEMENT  2011   rt total hip  . KNEE ARTHROSCOPY    . PILONIDAL CYST / SINUS EXCISION    . TOTAL HIP ARTHROPLASTY Left 10/09/2013   Procedure: LEFT TOTAL HIP ARTHROPLASTY ANTERIOR APPROACH;  Surgeon: [name], MD;  Location: [facility];  Service: Orthopedics;  Laterality: Left;    There were no vitals filed for this visit.   Subjective Assessment - 08/13/20 1705    Subjective Pt reports increased pain today - thinks she has identified the exercise that causing the pain to flare-up.    Pertinent History TIA, thyroid disease, Parkinsonism, kidney disease, HTN, HLD, GERD, DMII, depression, L hip avascular necrosis, anxiety, anemia, R THA 2011, L THA 2015, knee arthroscopy    Limitations Standing;Walking;House hold activities    How long can you stand comfortably? 45 minutes    How long can you walk comfortably? 45 minutes  Diagnostic tests per patient- R hip xray was clear    Patient Stated Goals decrease pain    Currently in Pain? Yes    Pain Score 7     Pain Location Hip    Pain Orientation Right;Lateral    Pain Descriptors / Indicators Sharp    Pain Type Acute pain    Pain Frequency Intermittent              [facility] PT Assessment - 08/13/20 1702      Assessment   Medical Diagnosis Contusion of R hip    Referring Provider (PT) Rod Can, MD    Onset Date/Surgical Date 06/15/20      Strength   Right Hip Flexion 4/5    Right Hip Extension 3+/5   limited ROM   Right Hip External Rotation  4-/5    Right Hip Internal Rotation 4/5    Right Hip ABduction 4/5    Right Hip ADduction 4-/5    Left Hip Flexion 4+/5    Left Hip Extension 4+/5    Left Hip External Rotation 4/5    Left Hip Internal  Rotation 4+/5    Left Hip ABduction 4/5    Left Hip ADduction 4+/5    Right Knee Flexion 4/5    Right Knee Extension 4+/5    Left Knee Flexion 4+/5    Left Knee Extension 5/5    Right Ankle Dorsiflexion 4/5    Right Ankle Plantar Flexion 4-/5    Left Ankle Dorsiflexion 4+/5    Left Ankle Plantar Flexion 4/5      Berg Balance Test   Sit to Stand Able to stand without using hands and stabilize independently    Standing Unsupported Able to stand safely 2 minutes    Sitting with Back Unsupported but Feet Supported on Floor or Stool Able to sit safely and securely 2 minutes    Stand to Sit Sits safely with minimal use of hands    Transfers Able to transfer safely, definite need of hands    Standing Unsupported with Eyes Closed Able to stand 10 seconds safely    Standing Unsupported with Feet Together Able to place feet together independently and stand 1 minute safely    From Standing, Reach Forward with Outstretched Arm Can reach forward >12 cm safely (5")    From Standing Position, Pick up Object from Floor Able to pick up shoe safely and easily    From Standing Position, Turn to Look Behind Over each Shoulder Looks behind one side only/other side shows less weight shift    Turn 360 Degrees Able to turn 360 degrees safely one side only in 4 seconds or less    Standing Unsupported, Alternately Place Feet on Step/Stool Able to complete 4 steps without aid or supervision    Standing Unsupported, One Foot in Front Able to take small step independently and hold 30 seconds    Standing on One Leg Able to lift leg independently and hold equal to or more than 3 seconds    Total Score 46                         [facility] Adult PT Treatment/Exercise - 08/13/20 1702      Knee/Hip Exercises: Aerobic   Recumbent Bike L2 x 6 min      Iontophoresis   Type of Iontophoresis Dexamethasone   #3/6   Location R lateral hip    Dose 1.0 mL;  66m-min    Time 4 hour patch      Manual Therapy    Manual Therapy Soft tissue mobilization;Myofascial release;Passive ROM    Soft tissue mobilization STM to R glute med and piriformis - greatest ttp in medial to mid piriformis    Myofascial Release manual TPR to R piriformis    Passive ROM R hip PROM all planes - limited in all motions with pain in IR, ER and adduction                    PT Short Term Goals - 07/30/20 1620      PT SHORT TERM GOAL #1   Title Patient to be independent with initial HEP.    Status Achieved   07/30/20   Target Date 07/20/20             PT Long Term Goals - 08/13/20 1710      PT LONG TERM GOAL #1   Title Patient to be independent with advanced HEP.    Time 8    Period Weeks    Status On-going    Target Date 08/31/20      PT LONG TERM GOAL #2   Title Patient to demonstrate R hip AROM WFL and without pain limiting.    Time 8    Period Weeks    Status On-going    Target Date 08/31/20      PT LONG TERM GOAL #3   Title Patient to demonstrate R LE strength >/=4+/5.    Time 8    Period Weeks    Status Partially Met    Target Date 08/31/20      PT LONG TERM GOAL #4   Title Patient to report tolerance of 1 hour on her feet without pain limiting.    Time 8    Period Weeks    Status Partially Met   08/13/20: able to stand/walk for 45 minutes w/o pain interference, improved from 30 min   Target Date 08/31/20      PT LONG TERM GOAL #5   Title Patient to score 48/56 on Berg in order to decrease risk of falls.    Time 8    Period Weeks    Status Partially Met   08/13/20: Berg = 46/56, improved from 41/56   Target Date 08/31/20                 Plan - 08/13/20 1708    Clinical Impression Statement Bradley reports pain continues to be intermittent but notes increased pain again today - she thinks she has identified which exercise is triggering her flare-ups (piriformis stretch with hip ER). Significant ttp evident in medial to mid R piriformis with painful PROM into hip ER, IR and  adduction although R hip ROM still limited in all planes. B LE strength grossly improved but weakness still evident R>L. She reports increased standing and walking tolerance to 45 minutes w/o pain interference. Balance is also improving with Berg improved from 41 to 46/56, although Makaylie did report multiple falls in the early part of her POC but no falls in the past 2 weeks. STG met with progress demonstrated toward majority of LTGs. Wrigley will continue to benefit from skilled PT to restore functional R hip ROM and LE strength w/o pain interference as well as improve balance and movement patterns related to Parkinson's disease to decrease risk for recurrent falls.    Comorbidities TIA, thyroid disease, Parkinsonism, kidney disease, HTN,  HLD, GERD, DMII, depression, L hip avascular necrosis, anxiety, anemia, R THA 2011, L THA 2015, knee arthroscopy    Rehab Potential Good    PT Frequency 2x / week    PT Duration 8 weeks    PT Treatment/Interventions ADLs/Self Care Home Management;Cryotherapy;Electrical Stimulation;Iontophoresis 37m/ml Dexamethasone;Moist Heat;Balance training;Therapeutic exercise;Therapeutic activities;Functional mobility training;Stair training;Gait training;Ultrasound;Neuromuscular re-education;Patient/family education;Manual techniques;Taping;Energy conservation;Dry needling;Passive range of motion;Scar mobilization    PT Next Visit Plan FOTO; progress R hip AROM and R LE strengthening to tolerance; STS transfers, balance training with narrow BOS; review seated & standing PWR! moves & progress to other positions as indicated    Consulted and Agree with Plan of Care Patient           Patient will benefit from skilled therapeutic intervention in order to improve the following deficits and impairments:  Abnormal gait,Decreased activity tolerance,Decreased strength,Increased fascial restricitons,Pain,Decreased balance,Difficulty walking,Improper body mechanics,Decreased range of  motion,Decreased safety awareness,Postural dysfunction,Impaired flexibility  Visit Diagnosis: Pain in right hip  Stiffness of right hip, not elsewhere classified  Muscle weakness (generalized)  Difficulty in walking, not elsewhere classified  Unsteadiness on feet     Problem List Patient Active Problem List   Diagnosis Date Noted  . Allergic conjunctivitis of both eyes 07/06/2020  . Gait abnormality 11/18/2019  . OSA on CPAP 06/04/2019  . Pain in joint of left shoulder 03/28/2019  . Seasonal allergic conjunctivitis 02/07/2019  . Chronic intermittent hypoxia with obstructive sleep apnea 01/25/2019  . Severe obstructive sleep apnea 01/25/2019  . Excessive daytime sleepiness 01/03/2019  . Non-restorative sleep 01/03/2019  . Nocturia more than twice per night 01/03/2019  . Weight gain, abnormal 01/03/2019  . Snorings 01/03/2019  . Trochanteric bursitis of right hip 04/27/2018  . Encounter for routine screening for malformation using ultrasonics 02/08/2018  . Abnormal weight gain 02/08/2018  . Acid indigestion 02/08/2018  . Acute antritis 02/08/2018  . Class 2 severe obesity with serious comorbidity and body mass index (BMI) of 39.0 to 39.9 in adult ([facility] 02/08/2018  . Perennial allergic rhinitis 02/08/2018  . Antiphospholipid syndrome ([place] 02/08/2018  . Arthralgia of hip or thigh 02/08/2018  . Asthma, cough variant 02/08/2018  . Vitamin D deficiency 02/08/2018  . Backhand tennis elbow 02/08/2018  . Cannot sleep 02/08/2018  . Chronic kidney disease (CKD), stage III (moderate) ([place] [place] 02/08/2018  . Current drug use 02/08/2018  . Depression, major, single episode, in partial remission ([place] 02/08/2018  . Diabetes mellitus with polyneuropathy ([facility] 02/08/2018  . Difficulty hearing 02/08/2018  . Disturbance of skin sensation 02/08/2018  . Hypertension associated with type 2 diabetes mellitus ([facility] 02/08/2018  . Extremity pain 02/08/2018  . Facet syndrome, lumbar 02/08/2018   . Factor V Leiden ([facility] [place] 02/08/2018  . Fungal infection of nail 02/08/2018  . Gastroenteritis and colitis, viral 02/08/2018  . Hypoglycemia 02/08/2018  . Menopause 02/08/2018  . Other long term (current) drug therapy 02/08/2018  . Other osteonecrosis, unspecified femur ([facility] 02/08/2018  . Pure hypercholesterolemia 02/08/2018  . Recurrent major depressive episodes ([facility] 02/08/2018  . Syncope and collapse 02/08/2018  . Moderate persistent asthma 02/08/2018  . Cough, persistent 02/08/2018  . Restless leg syndrome 01/15/2018  . Degeneration of lumbar intervertebral disc 11/21/2017  . Tremor of right hand 03/02/2017  . Parkinson disease ([facility] 03/02/2017  . UTI (urinary tract infection) 12/01/2015  . Type 2 diabetes mellitus with stage 3b chronic kidney disease, with long-term current use of insulin ([facility] 06/03/2015  . Fast heart beat 04/09/2015  . Neuropathy due  to type 2 diabetes mellitus ([place]) 04/24/2014  . Postoperative anemia due to acute blood loss 10/10/2013  . Avascular necrosis of bone of left hip ([place]) 10/09/2013  . Arthritis of pelvic region, degenerative 10/09/2013  . Breath shortness 06/18/2013  . Diabetes mellitus type 2, uncontrolled ([place]) 05/31/2013  . Intermittent claudication ([place]) 04/04/2013  . Anxiety   . Gastroesophageal reflux disease 09/12/2012  . Clinical depression 02/21/2012  . Stroke ([place]) 02/11/2012  . History of revision of total replacement of right hip joint 04/04/2009    Percival Spanish, PT, MPT 08/13/2020, 6:29 PM  [facility] [address]  [place], [place], [address] Phone: [phone]   Fax:  [phone]  Name: TEWANA BOHLEN MRN: [id number] Date of Birth: 1955-10-02

## 2020-08-14 ENCOUNTER — Other Ambulatory Visit (HOSPITAL_BASED_OUTPATIENT_CLINIC_OR_DEPARTMENT_OTHER): Payer: Self-pay

## 2020-08-14 MED ORDER — MYRBETRIQ 50 MG PO TB24: ORAL_TABLET | ORAL | 6 refills | Status: DC

## 2020-08-24 ENCOUNTER — Encounter: Payer: Self-pay | Admitting: Physical Therapy

## 2020-08-24 ENCOUNTER — Other Ambulatory Visit: Payer: Self-pay

## 2020-08-24 ENCOUNTER — Ambulatory Visit: Payer: 59 | Admitting: Physical Therapy

## 2020-08-24 DIAGNOSIS — R2681 Unsteadiness on feet: Secondary | ICD-10-CM | POA: Diagnosis not present

## 2020-08-24 DIAGNOSIS — M25651 Stiffness of right hip, not elsewhere classified: Secondary | ICD-10-CM

## 2020-08-24 DIAGNOSIS — M6281 Muscle weakness (generalized): Secondary | ICD-10-CM | POA: Diagnosis not present

## 2020-08-24 DIAGNOSIS — R262 Difficulty in walking, not elsewhere classified: Secondary | ICD-10-CM

## 2020-08-24 DIAGNOSIS — M25551 Pain in right hip: Secondary | ICD-10-CM | POA: Diagnosis not present

## 2020-08-24 NOTE — Patient Instructions (Signed)
TENS stands for Transcutaneous Electrical Nerve Stimulation. In other words, electrical impulses are allowed to pass through the skin in order to excite a nerve.   Purpose and Use of TENS:  TENS is a method used to manage acute and chronic pain without the use of drugs. It has been effective in managing pain associated with surgery, sprains, strains, trauma, rheumatoid arthritis, and neuralgias. It is a non-addictive, low risk, and non-invasive technique used to control pain. It is not, by any means, a curative form of treatment.   How TENS Works:  Most TENS units are a small pocket-sized unit powered by one 9 volt battery. Attached to the outside of the unit are two lead wires where two pins and/or snaps connect on each wire. All units come with a set of four reusable pads or electrodes. These are placed on the skin surrounding the area involved. By inserting the leads into  the pads, the electricity can pass from the unit making the circuit complete.  As the intensity is turned up slowly, the electrical current enters the body from the electrodes through the skin to the surrounding nerve fibers. This triggers the release of hormones from within the body. These hormones contain pain relievers. By increasing the circulation of these hormones, the person's pain may be lessened. It is also believed that the electrical stimulation itself helps to block the pain messages being sent to the brain, thus also decreasing the body's perception of pain.   Hazards:  TENS units are NOT to be used by patients with PACEMAKERS, DEFIBRILLATORS, DIABETIC PUMPS, PREGNANT WOMEN, and patients with SEIZURE DISORDERS.  TENS units are NOT to be used over the heart, throat, brain, or spinal cord.  One of the major side effects from the TENS unit may be skin irritation. Some people may develop a rash if they are sensitive to the materials used in the electrodes or the connecting wires.   Wear the unit for 15 min.   Avoid  overuse due the body getting used to the stem making it not as effective over time.     TENS UNIT  This is helpful for muscle pain and spasm.   Search and Purchase a TENS 7000 2nd edition at www.tenspros.com or www.amazon.com  (It should be less than $30)     TENS unit instructions:   Do not shower or bathe with the unit on  Turn the unit off before removing electrodes or batteries  If the electrodes lose stickiness add a drop of water to the electrodes after they are disconnected from the unit and place on plastic sheet. If you continued to have difficulty, call the TENS unit company to purchase more electrodes.  Do not apply lotion on the skin area prior to use. Make sure the skin is clean and dry as this will help prolong the life of the electrodes.  After use, always check skin for unusual red areas, rash or other skin difficulties. If there are any skin problems, does not apply electrodes to the same area.  Never remove the electrodes from the unit by pulling the wires.  Do not use the TENS unit or electrodes other than as directed.  Do not change electrode placement without consulting your therapist or physician.  Keep 2 fingers with between each electrode.  

## 2020-08-24 NOTE — Therapy (Signed)
[facility] [address]  [place], [place], [address] Phone: [phone]   Fax:  [phone]  Physical Therapy Treatment  Patient Details  Name: Deanna Rowe MRN: [id number] Date of Birth: 12/26/1955 Referring Provider (PT): Rod Can, MD   Encounter Date: 08/24/2020   PT End of Session - 08/24/20 1649    Visit Number 11    Number of Visits 17    Date for PT Re-Evaluation 08/31/20    Authorization Type [facility]    PT Start Time 1613    PT Stop Time 1700    PT Time Calculation (min) 47 min    Activity Tolerance Patient limited by pain    Behavior During Therapy [facility] for tasks assessed/performed           Past Medical History:  Diagnosis Date  . Anemia   . Anxiety   . Arthritis    L HIP  . Asthma   . Avascular necrosis of hip (HCC)    LEFT  . Back pain   . Chest pain   . Constipation   . Depression   . Diabetes mellitus   . Diabetes mellitus, type II ([place])   . Dry cough   . Edema, lower extremity   . Gait abnormality 11/18/2019  . GERD (gastroesophageal reflux disease)   . High cholesterol   . Hypertension   . Insomnia    takes Ambien nightly  . Joint pain   . Kidney disease   . Kidney disease   . Obesity   . Parkinsonism ([place]) 03/02/2017  . PONV (postoperative nausea and vomiting)   . RLS (restless legs syndrome) 01/15/2018  . Sleep apnea   . Swallowing difficulty   . Thyroid disease   . TIA (transient ischemic attack) 2012   no residual problems  . Urgency incontinence   . Vitamin D deficiency     Past Surgical History:  Procedure Laterality Date  . DILATION AND CURETTAGE OF UTERUS    . ESOPHAGEAL MANOMETRY N/A 09/03/2012   Procedure: ESOPHAGEAL MANOMETRY (EM);  Surgeon: Garlan Fair, MD;  Location: [facility];  Service: Endoscopy;  Laterality: N/A;  . ESOPHAGEAL MANOMETRY N/A 06/19/2017   Procedure: ESOPHAGEAL MANOMETRY (EM);  Surgeon: Clarene Essex, MD;  Location: [facility]  ENDOSCOPY;  Service: Endoscopy;  Laterality: N/A;  . EXPLORATORY LAPAROTOMY    . GANGLION CYST EXCISION    . JOINT REPLACEMENT  2011   rt total hip  . KNEE ARTHROSCOPY    . PILONIDAL CYST / SINUS EXCISION    . TOTAL HIP ARTHROPLASTY Left 10/09/2013   Procedure: LEFT TOTAL HIP ARTHROPLASTY ANTERIOR APPROACH;  Surgeon: [name], MD;  Location: [facility];  Service: Orthopedics;  Laterality: Left;    There were no vitals filed for this visit.   Subjective Assessment - 08/24/20 1615    Subjective Has been having pain in the R hip which has messed with her sleeping. Feels like the most recent fall she had increased her hip pain, however her MD did x-rays and said that everything looked good. Has not had any new falls.    Pertinent History TIA, thyroid disease, Parkinsonism, kidney disease, HTN, HLD, GERD, DMII, depression, L hip avascular necrosis, anxiety, anemia, R THA 2011, L THA 2015, knee arthroscopy    Diagnostic tests per patient- R hip xray was clear    Patient Stated Goals decrease pain    Currently in Pain? Yes  Pain Score 10-Worst pain ever    Pain Orientation Right;Lateral    Pain Descriptors / Indicators Sharp    Pain Type Acute pain                             [facility] Adult PT Treatment/Exercise - 08/24/20 0001      Knee/Hip Exercises: Aerobic   Nustep L4 x 6 min (UEs/LEs)      Knee/Hip Exercises: Supine   Other Supine Knee/Hip Exercises LTR x10 to tolerance; supine butterfly stretch 2x30"   limited ROM     Modalities   Modalities Electrical Stimulation;Cryotherapy      Cryotherapy   Number Minutes Cryotherapy 15 Minutes    Cryotherapy Location Hip   R   Type of Cryotherapy Ice pack      Electrical Stimulation   Electrical Stimulation Location R lateral hip    Electrical Stimulation Action IFC    Electrical Stimulation Parameters output to tolerance; 15 min    Electrical Stimulation Goals Pain      Manual Therapy   Manual Therapy Soft  tissue mobilization;Myofascial release;Passive ROM    Manual therapy comments L sidelying    Soft tissue mobilization STM to R glute med and piriformis    Myofascial Release manual TPR to R piriformis and lateral glute   large soft tissue restriction in R lateral glute; discontinued d/t pain                 PT Education - 08/24/20 1648    Education Details encouraged patient to f/u with MD d/t poor tolerance for therapy    Person(s) Educated Patient;Spouse    Methods Explanation    Comprehension Verbalized understanding            PT Short Term Goals - 07/30/20 1620      PT SHORT TERM GOAL #1   Title Patient to be independent with initial HEP.    Status Achieved   07/30/20   Target Date 07/20/20             PT Long Term Goals - 08/13/20 1710      PT LONG TERM GOAL #1   Title Patient to be independent with advanced HEP.    Time 8    Period Weeks    Status On-going    Target Date 08/31/20      PT LONG TERM GOAL #2   Title Patient to demonstrate R hip AROM WFL and without pain limiting.    Time 8    Period Weeks    Status On-going    Target Date 08/31/20      PT LONG TERM GOAL #3   Title Patient to demonstrate R LE strength >/=4+/5.    Time 8    Period Weeks    Status Partially Met    Target Date 08/31/20      PT LONG TERM GOAL #4   Title Patient to report tolerance of 1 hour on her feet without pain limiting.    Time 8    Period Weeks    Status Partially Met   08/13/20: able to stand/walk for 45 minutes w/o pain interference, improved from 30 min   Target Date 08/31/20      PT LONG TERM GOAL #5   Title Patient to score 48/56 on Berg in order to decrease risk of falls.    Time 8    Period Weeks  Status Partially Met   08/13/20: Berg = 46/56, improved from 41/56   Target Date 08/31/20                 Plan - 08/24/20 1649    Clinical Impression Statement Patient arrived to session with report of severe R hip pain. Notes that the last fall  she had really flared it up, but denies new falls. Also reports that she was able to see Dr. Lyla Glassing for an x-ray to the hip after her most recent fall which was normal- unable to access this imaging today. Attempted MT to address soft tissue restriction as patient has received good benefit from this before- patient demonstrated large soft tissue restriction in R lateral glute but with poor tolerance d/t sensitivity. Attempted very low intensity hip stretching which were performed within limited ROM, thus with questionable benefit. D/t patient's high pain levels today, remainder of session was spent on e-stim and ice to address pain. Patient reported improvement in pain at end of session. Spoke with patient's husband at end of session and encouraged them to f/u with MD d/t poor tolerance for therapy and remaining high pain levels.    Comorbidities TIA, thyroid disease, Parkinsonism, kidney disease, HTN, HLD, GERD, DMII, depression, L hip avascular necrosis, anxiety, anemia, R THA 2011, L THA 2015, knee arthroscopy    Rehab Potential Good    PT Frequency 2x / week    PT Duration 8 weeks    PT Treatment/Interventions ADLs/Self Care Home Management;Cryotherapy;Electrical Stimulation;Iontophoresis 23m/ml Dexamethasone;Moist Heat;Balance training;Therapeutic exercise;Therapeutic activities;Functional mobility training;Stair training;Gait training;Ultrasound;Neuromuscular re-education;Patient/family education;Manual techniques;Taping;Energy conservation;Dry needling;Passive range of motion;Scar mobilization    PT Next Visit Plan FOTO; progress R hip AROM and R LE strengthening to tolerance; STS transfers, balance training with narrow BOS; review seated & standing PWR! moves & progress to other positions as indicated    Consulted and Agree with Plan of Care Patient           Patient will benefit from skilled therapeutic intervention in order to improve the following deficits and impairments:  Abnormal  gait,Decreased activity tolerance,Decreased strength,Increased fascial restricitons,Pain,Decreased balance,Difficulty walking,Improper body mechanics,Decreased range of motion,Decreased safety awareness,Postural dysfunction,Impaired flexibility  Visit Diagnosis: Pain in right hip  Stiffness of right hip, not elsewhere classified  Muscle weakness (generalized)  Difficulty in walking, not elsewhere classified  Unsteadiness on feet     Problem List Patient Active Problem List   Diagnosis Date Noted  . Allergic conjunctivitis of both eyes 07/06/2020  . Gait abnormality 11/18/2019  . OSA on CPAP 06/04/2019  . Pain in joint of left shoulder 03/28/2019  . Seasonal allergic conjunctivitis 02/07/2019  . Chronic intermittent hypoxia with obstructive sleep apnea 01/25/2019  . Severe obstructive sleep apnea 01/25/2019  . Excessive daytime sleepiness 01/03/2019  . Non-restorative sleep 01/03/2019  . Nocturia more than twice per night 01/03/2019  . Weight gain, abnormal 01/03/2019  . Snorings 01/03/2019  . Trochanteric bursitis of right hip 04/27/2018  . Encounter for routine screening for malformation using ultrasonics 02/08/2018  . Abnormal weight gain 02/08/2018  . Acid indigestion 02/08/2018  . Acute antritis 02/08/2018  . Class 2 severe obesity with serious comorbidity and body mass index (BMI) of 39.0 to 39.9 in adult ([place] 02/08/2018  . Perennial allergic rhinitis 02/08/2018  . Antiphospholipid syndrome ([place] 02/08/2018  . Arthralgia of hip or thigh 02/08/2018  . Asthma, cough variant 02/08/2018  . Vitamin D deficiency 02/08/2018  . Backhand tennis elbow 02/08/2018  . Cannot sleep 02/08/2018  .  Chronic kidney disease (CKD), stage III (moderate) ([place]) 02/08/2018  . Current drug use 02/08/2018  . Depression, major, single episode, in partial remission ([place]) 02/08/2018  . Diabetes mellitus with polyneuropathy ([place]) 02/08/2018  . Difficulty hearing 02/08/2018  . Disturbance of  skin sensation 02/08/2018  . Hypertension associated with type 2 diabetes mellitus ([place]) 02/08/2018  . Extremity pain 02/08/2018  . Facet syndrome, lumbar 02/08/2018  . Factor V Leiden ([place]) 02/08/2018  . Fungal infection of nail 02/08/2018  . Gastroenteritis and colitis, viral 02/08/2018  . Hypoglycemia 02/08/2018  . Menopause 02/08/2018  . Other long term (current) drug therapy 02/08/2018  . Other osteonecrosis, unspecified femur ([place]) 02/08/2018  . Pure hypercholesterolemia 02/08/2018  . Recurrent major depressive episodes ([place]) 02/08/2018  . Syncope and collapse 02/08/2018  . Moderate persistent asthma 02/08/2018  . Cough, persistent 02/08/2018  . Restless leg syndrome 01/15/2018  . Degeneration of lumbar intervertebral disc 11/21/2017  . Tremor of right hand 03/02/2017  . Parkinson disease ([place]) 03/02/2017  . UTI (urinary tract infection) 12/01/2015  . Type 2 diabetes mellitus with stage 3b chronic kidney disease, with long-term current use of insulin ([place]) 06/03/2015  . Fast heart beat 04/09/2015  . 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 2 diabetes mellitus ([place]) 04/24/2014  . Postoperative anemia due to acute blood loss 10/10/2013  . Avascular necrosis of bone of left hip ([place]) 10/09/2013  . Arthritis of pelvic region, degenerative 10/09/2013  . Breath shortness 06/18/2013  . Diabetes mellitus type 2, uncontrolled ([place]) 05/31/2013  . Intermittent claudication ([place]) 04/04/2013  . Anxiety   . Gastroesophageal reflux disease 09/12/2012  . Clinical depression 02/21/2012  . Stroke ([place]) 02/11/2012  . History of revision of total replacement of right hip joint 04/04/2009    Janene Harvey, PT, DPT 08/24/20 5:13 PM   [facility] [address]  [place], [place], [address] Phone: [phone]   Fax:  [phone]  Name: Deanna Rowe MRN: [id number] Date of Birth: [birth year]

## 2020-08-26 ENCOUNTER — Telehealth: Payer: Self-pay | Admitting: Neurology

## 2020-08-26 ENCOUNTER — Ambulatory Visit (INDEPENDENT_AMBULATORY_CARE_PROVIDER_SITE_OTHER): Payer: 59 | Admitting: Neurology

## 2020-08-26 ENCOUNTER — Other Ambulatory Visit (HOSPITAL_BASED_OUTPATIENT_CLINIC_OR_DEPARTMENT_OTHER): Payer: Self-pay

## 2020-08-26 ENCOUNTER — Encounter: Payer: Self-pay | Admitting: Neurology

## 2020-08-26 VITALS — Ht 66.0 in | Wt 215.4 lb

## 2020-08-26 DIAGNOSIS — G20 Parkinson's disease: Secondary | ICD-10-CM | POA: Diagnosis not present

## 2020-08-26 MED ORDER — CARBIDOPA-LEVODOPA ER 50-200 MG PO TBCR
EXTENDED_RELEASE_TABLET | ORAL | 1 refills | Status: DC
  Filled 2020-08-26: qty 450, 90d supply, fill #0
  Filled 2020-12-18: qty 450, 90d supply, fill #1

## 2020-08-26 MED ORDER — CARBIDOPA-LEVODOPA 25-250 MG PO TABS: ORAL_TABLET | ORAL | 1 refills | Status: DC

## 2020-08-26 NOTE — Telephone Encounter (Signed)
Dr. Jannifer Franklin asked to see patient in 5 months but there are no openings for him in October. How should we proceed with scheduling?

## 2020-08-26 NOTE — Progress Notes (Signed)
Reason for visit: Parkinson's disease  Deanna Rowe is an 65 y.o. female  History of present illness:  Deanna Rowe is a 65 year old right-handed black female with a history of Parkinson's disease, depression, diabetes, and sleep apnea.  The patient is currently in physical therapy twice weekly.  She has not had any falls in about 2 weeks.  She claims that she has gained improvement with taking the Sinemet CR more frequently, but she still has problems with wearing off around 2 PM.  The patient may freeze up and fall at times.  The patient may use a cane or walker for ambulation.  She is tolerating medications fairly well, she denies any excessive daytime drowsiness currently.  She returns to the office today for further evaluation.  Past Medical History:  Diagnosis Date  . Anemia   . Anxiety   . Arthritis    L HIP  . Asthma   . Avascular necrosis of hip (HCC)    LEFT  . Back pain   . Chest pain   . Constipation   . Depression   . Diabetes mellitus   . Diabetes mellitus, type II ([place])   . Dry cough   . Edema, lower extremity   . Gait abnormality 11/18/2019  . GERD (gastroesophageal reflux disease)   . High cholesterol   . Hypertension   . Insomnia    takes Ambien nightly  . Joint pain   . Kidney disease   . Kidney disease   . Obesity   . Parkinsonism ([place]) 03/02/2017  . PONV (postoperative nausea and vomiting)   . RLS (restless legs syndrome) 01/15/2018  . Sleep apnea   . Swallowing difficulty   . Thyroid disease   . TIA (transient ischemic attack) 2012   no residual problems  . Urgency incontinence   . Vitamin D deficiency     Past Surgical History:  Procedure Laterality Date  . DILATION AND CURETTAGE OF UTERUS    . ESOPHAGEAL MANOMETRY N/A 09/03/2012   Procedure: ESOPHAGEAL MANOMETRY (EM);  Surgeon: Garlan Fair, MD;  Location: [facility];  Service: Endoscopy;  Laterality: N/A;  . ESOPHAGEAL MANOMETRY N/A 06/19/2017   Procedure: ESOPHAGEAL MANOMETRY  (EM);  Surgeon: Clarene Essex, MD;  Location: [facility];  Service: Endoscopy;  Laterality: N/A;  . EXPLORATORY LAPAROTOMY    . GANGLION CYST EXCISION    . JOINT REPLACEMENT  2011   rt total hip  . KNEE ARTHROSCOPY    . PILONIDAL CYST / SINUS EXCISION    . TOTAL HIP ARTHROPLASTY Left 10/09/2013   Procedure: LEFT TOTAL HIP ARTHROPLASTY ANTERIOR APPROACH;  Surgeon: [name], MD;  Location: [facility];  Service: Orthopedics;  Laterality: Left;    Family History  Problem Relation Age of Onset  . Alcohol abuse Brother   . Alcohol abuse Brother   . Alcohol abuse Brother   . Alcohol abuse Brother   . High blood pressure Mother   . Hyperlipidemia Mother   . Heart disease Mother   . Heart disease Father   . Alcoholism Father   . Allergic rhinitis Sister   . Diabetes Neg Hx   . Angioedema Neg Hx   . Asthma Neg Hx   . Eczema Neg Hx   . Immunodeficiency Neg Hx   . Urticaria Neg Hx   . Breast cancer Neg Hx     Social history:  reports that she has never smoked. She has never used smokeless tobacco. She reports that  she does not drink alcohol and does not use drugs.    Allergies  Allergen Reactions  . Fluticasone-Salmeterol Anaphylaxis  . Codeine Nausea And Vomiting  . Fetzima [Levomilnacipran] Nausea And Vomiting  . Nsaids Other (See Comments)    Upset stomach   . Trulicity [Dulaglutide] Nausea And Vomiting    Significant and undesirably rapid (40 lbs) weight loss   . Xanax [Alprazolam] Other (See Comments)    disoriented  . Amoxicillin Rash    Has patient had a PCN reaction causing immediate rash, facial/tongue/throat swelling, SOB or lightheadedness with hypotension: Yes Has patient had a PCN reaction causing severe rash involving mucus membranes or skin necrosis: No Has patient had a PCN reaction that required hospitalization: No Has patient had a PCN reaction occurring within the last 10 years: No If all of the above answers are "NO", then may proceed with Cephalosporin  use.   . Pseudoephedrine Hcl Er Palpitations    Medications:  Prior to Admission medications   Medication Sig Start Date End Date Taking? Authorizing Provider  ACCU-CHEK FASTCLIX LANCETS MISC USE TO CHECK BLOOD SUGAR 3 TIMES A DAY 02/27/18  Yes Renato Shin, MD  ACCU-CHEK GUIDE test strip USE TO CHECK BLOOD SUGAR 3 TIMES PER DAY. Patient taking differently: Use to check blood sugar 4 times per day. 02/02/17  Yes Renato Shin, MD  albuterol (VENTOLIN HFA) 108 (90 Base) MCG/ACT inhaler Inhale 2 puffs into the lungs as needed for wheezing or shortness of breath. 07/06/20  Yes Ambs, Kathrine Cords, FNP  amLODipine (NORVASC) 5 MG tablet TAKE ONE TABLET BY MOUTH DAILY. 07/21/20  Yes   aspirin EC 81 MG tablet Take 81 mg by mouth every evening.   Yes [provider]  atorvastatin (LIPITOR) 10 MG tablet TAKE 1 TABLET BY MOUTH AT BEDTIME 03/06/20 03/06/21 Yes Delrae Rend, MD  azelastine (ASTELIN) 0.1 % nasal spray Place 2 sprays into both nostrils 2 (two) times daily as needed. 07/06/20  Yes Ambs, Kathrine Cords, FNP  brexpiprazole (REXULTI) 2 MG TABS tablet TAKE 1 TABLET BY MOUTH EVERY MORNING 10/16/19 10/15/20 Yes Ricard Dillon, MD  brexpiprazole (REXULTI) 2 MG TABS tablet take 1 tablet by mouth every morning 04/16/20  Yes   Brexpiprazole (REXULTI) 2 MG TABS Take 2 mg by mouth daily. Patient taking differently: Take 1 mg by mouth daily. 07/10/18  Yes Kathrynn Ducking, MD  budesonide-formoterol [facility]) 160-4.5 MCG/ACT inhaler Inhale 2 puffs into the lungs twice a day with spacer Patient taking differently: Inhale 2 puffs into the lungs twice a day with spacer 07/06/20  Yes Ambs, Kathrine Cords, FNP  buPROPion (WELLBUTRIN XL) 150 MG 24 hr tablet TAKE 3 TABLETS BY MOUTH EVERY MORNING 04/16/20 04/16/21 Yes Ricard Dillon, MD  Calcium Carbonate-Vitamin D 600-400 MG-UNIT tablet Take 1 tablet by mouth daily.   Yes [provider]  carbidopa-levodopa (SINEMET CR) 50-200 MG tablet TAKE 1 TABLET BY MOUTH 5  (FIVE) TIMES DAILY. 04/22/20 04/22/21 Yes Kathrynn Ducking, MD  carbidopa-levodopa (SINEMET) 25-250 MG tablet Take 1 tablet by mouth in the morning. 04/22/20  Yes Kathrynn Ducking, MD  EPINEPHrine (EPIPEN 2-PAK) 0.3 mg/0.3 mL IJ SOAJ injection Inject 0.3 mg into the muscle as needed for anaphylaxis. 12/13/19  Yes Bobbitt, [place] Muta, MD  famotidine (PEPCID) 20 MG tablet TAKE 1 TABLET (20 MG TOTAL) BY MOUTH 2 (TWO) TIMES DAILY. 07/06/20 07/06/21 Yes Ambs, Kathrine Cords, FNP  furosemide (LASIX) 20 MG tablet TAKE 1 (ONE) TABLET BY MOUTH EVERY  OTHER DAY 02/12/20 02/11/21 Yes Elmarie Shiley, MD  gabapentin (NEURONTIN) 300 MG capsule TAKE 1 CAPSULE BY MOUTH TWICE DAILY 06/10/20 06/10/21 Yes Rankins, Bill Salinas, MD  glucose blood test strip CHECK BLOOD SUGAR 4 TIMES DAILY Patient taking differently: CHECK BLOOD SUGAR 4 TIMES DAILY 05/28/20 05/28/21 Yes Delrae Rend, MD  insulin degludec (TRESIBA) 100 UNIT/ML FlexTouch Pen INJECT UNDER THE SKIN ONCE DAILY. TRITRATE AS DIRECTED WITH A MAXIMUM DAILY DOSE OF 50 UNITS 06/18/20 06/18/21 Yes Delrae Rend, MD  Insulin Glargine [facility] KWIKPEN) 100 UNIT/ML INJECT 44 UNITS UNDER THE SKIN ONCE DAILY **MAX OF 50 UNITS DAILY** 10/15/19 10/14/20 Yes Delrae Rend, MD  insulin lispro (HUMALOG) 100 UNIT/ML injection SQ - 14 units at breakfast, 12 units at lunch, 16 units at supper 11/05/19  Yes Whitmire, Dawn W, FNP  Insulin Pen Needle 32G X 4 MM MISC USE AS DIRECTED 06/22/20 06/22/21 Yes Delrae Rend, MD  Lancets (FREESTYLE) lancets USE TO CHECK BLOOD SUGAR 4 TIMES DAILY FOR 90 DAYS Patient taking differently: USE TO CHECK BLOOD SUGAR 4 TIMES DAILY FOR 90 DAYS 05/29/20 05/29/21 Yes Delrae Rend, MD  levocetirizine (XYZAL) 5 MG tablet Take 1 tablet (5 mg total) by mouth daily as needed. 07/06/20 07/06/21 Yes Ambs, Kathrine Cords, FNP  levocetirizine (XYZAL) 5 MG tablet TAKE 1 TABLET (5 MG TOTAL) BY MOUTH EVERY EVENING. 07/06/20 07/06/21  Dara Hoyer, FNP  LORazepam (ATIVAN) 2 MG tablet TAKE 1 TABLET BY MOUTH  THREE TIMES DAILY 04/16/20 10/13/20 Yes Ricard Dillon, MD  losartan-hydrochlorothiazide [facility]) 100-12.5 MG tablet TAKE 1 TABLET BY MOUTH ONCE DAILY 01/27/20 01/26/21 Yes Leighton Ruff, MD  mirabegron ER (MYRBETRIQ) 25 MG TB24 tablet Take one tablet by mouth once daily. 08/13/20  Yes   montelukast (SINGULAIR) 10 MG tablet TAKE 1 TABLET (10 MG TOTAL) BY MOUTH AT BEDTIME. 07/06/20 07/06/21 Yes Ambs, Kathrine Cords, FNP  montelukast (SINGULAIR) 10 MG tablet TAKE 1 TABLET (10 MG TOTAL) BY MOUTH AT BEDTIME. 07/06/20 07/06/21  Ambs, Kathrine Cords, FNP  Olopatadine HCl (PATADAY) 0.2 % SOLN Place 1 drop into both eyes daily as needed. 07/06/20  Yes Ambs, Kathrine Cords, FNP  omalizumab Arvid Right) 150 MG/ML prefilled syringe Inject 300 mg into the skin every 28 (twenty-eight) days. 07/29/20  Yes Tresa Garter, MD  propranolol (INDERAL) 40 MG tablet TAKE 1 TABLET BY MOUTH TWICE DAILY 05/19/20 05/19/21 Yes Jettie Booze, MD  rOPINIRole (REQUIP) 2 MG tablet TAKE 1 TABLET BY MOUTH TWICE DAILY 04/16/20 04/16/21 Yes Ricard Dillon, MD  Semaglutide,0.25 or 0.5MG /DOS, (OZEMPIC, 0.25 OR 0.5 MG/DOSE,) 2 MG/1.5ML SOPN Inject 0.25 mg into the skin once a week.   Yes [provider]  solifenacin (VESICARE) 10 MG tablet TAKE 1 TABLET BY MOUTH ONCE DAILY 06/10/20 06/10/21 Yes Ceasar Mons, MD  triamcinolone (NASACORT) 55 MCG/ACT AERO nasal inhaler Place 2 sprays into the nose daily. 07/06/20  Yes Ambs, Kathrine Cords, FNP  valACYclovir (VALTREX) 500 MG tablet TAKE 1 TABLET BY MOUTH ONCE DAILY 04/10/20 04/10/21 Yes Servando Salina, MD  vitamin C (ASCORBIC ACID) 500 MG tablet Take 500 mg by mouth daily.   Yes [provider]  Vitamin D, Ergocalciferol, 2000 units CAPS Take 1 capsule by mouth daily. 2000 units daily.   Yes [provider]  vortioxetine HBr (TRINTELLIX) 20 MG TABS tablet TAKE 1 TABLET BY MOUTH EVERY MORNING 04/16/20 04/16/21 Yes Ricard Dillon, MD  zolpidem (AMBIEN) 5 MG tablet TAKE 1  TABLET BY MOUTH EVERY NIGHT AT BEDTIME  04/16/20 10/13/20 Yes Ricard Dillon, MD  amLODipine (NORVASC) 5 MG tablet TAKE ONE TABLET BY MOUTH DAILY. 12/23/19 12/22/20  Leighton Ruff, MD  COVID-19 Ad26 vaccine, JANSSEN/J&J, 0.5 ML injection INJECT AS DIRECTED 02/10/20 02/09/21  Carlyle Basques, MD  dexlansoprazole (DEXILANT) 60 MG capsule TAKE 1 CAPSULE (60 MG TOTAL) BY MOUTH DAILY. 07/06/20 07/06/21  Dara Hoyer, FNP  influenza vac split quadrivalent PF (FLUARIX) 0.5 ML injection TAKE AS DIRECTED 01/24/20 01/23/21  Carlyle Basques, MD  Insulin Pen Needle 32G X 6 MM MISC USE TO INJECT INSULIN FOUR TIMES DAILY FOR 90 DAYS 09/26/19 09/25/20  Delrae Rend, MD  losartan-hydrochlorothiazide [facility]) 100-12.5 MG tablet Take 1 tablet by mouth once daily 08/10/20     mirabegron ER (MYRBETRIQ) 50 MG TB24 tablet TAKE 1 TABLET BY MOUTH ONCE DAILY 07/25/19 08/12/20  Ceasar Mons, MD  mirabegron ER (MYRBETRIQ) 50 MG TB24 tablet Take 1 tablet by mouth once daily 08/14/20     Semaglutide,0.25 or 0.5MG /DOS, 2 MG/1.5ML SOPN INJECT 0.25 MG ONCE WEEKLY FOR 4 WEEKS THEN 0.5 MG ONCE WEEKLY FOR 2 WEEKS SUBCUTANEOUS ONCE A WEEK Patient taking differently: [facility] 02/17/20 02/16/21  Delrae Rend, MD    ROS:  Out of a complete 14 system review of symptoms, the patient complains only of the following symptoms, and all other reviewed systems are negative.  Walking difficulty Depression, anxiety   Height 5\' 6"  (1.676 m), weight 215 lb 6.4 oz (97.7 kg).  Physical Exam  General: The patient is alert and cooperative at the time of the examination.  The patient is markedly obese.  Skin: No significant peripheral edema is noted.   Neurologic Exam  Mental status: The patient is alert and oriented x 3 at the time of the examination. The patient has apparent normal recent and remote memory, with an apparently normal attention span and concentration ability.   Cranial nerves: Facial symmetry is present. Speech  is normal, no aphasia or dysarthria is noted. Extraocular movements are full. Visual fields are full.  Motor: The patient has good strength in all 4 extremities.  Sensory examination: Soft touch sensation is symmetric on the face, arms, and legs.  Coordination: The patient has good finger-nose-finger and heel-to-shin bilaterally.  Gait and station: The patient is able to arise from a seated position with arms crossed.  Once up, she can walk independently, she has symmetric arm swing, fairly good turns.  Tandem gait is unsteady.  Romberg is negative.  The patient normally walks with a cane.  Reflexes: Deep tendon reflexes are symmetric.   Assessment/Plan:  1.  Parkinson's disease  2.  Gait instability  The patient is actually doing fairly well today, she is walking quite well.  We will continue the Sinemet CR dosing as is, continue on the Requip.  We will increase the Sinemet 25/250 mg tablets take 1 in the morning and she may take another 1 around 2 PM if needed.  The patient will follow up in 5 months, she may see Dr. Rexene Alberts.  Jill Alexanders MD 08/26/2020 11:51 AM  [facility] [address] [place], [place] [address]  Phone [phone] Fax [phone]

## 2020-08-27 ENCOUNTER — Encounter: Payer: Self-pay | Admitting: Physical Therapy

## 2020-08-27 ENCOUNTER — Ambulatory Visit: Payer: 59 | Admitting: Physical Therapy

## 2020-08-27 ENCOUNTER — Other Ambulatory Visit: Payer: Self-pay

## 2020-08-27 DIAGNOSIS — M25551 Pain in right hip: Secondary | ICD-10-CM | POA: Diagnosis not present

## 2020-08-27 DIAGNOSIS — R262 Difficulty in walking, not elsewhere classified: Secondary | ICD-10-CM | POA: Diagnosis not present

## 2020-08-27 DIAGNOSIS — K5730 Diverticulosis of large intestine without perforation or abscess without bleeding: Secondary | ICD-10-CM | POA: Diagnosis not present

## 2020-08-27 DIAGNOSIS — R2681 Unsteadiness on feet: Secondary | ICD-10-CM | POA: Diagnosis not present

## 2020-08-27 DIAGNOSIS — M25651 Stiffness of right hip, not elsewhere classified: Secondary | ICD-10-CM | POA: Diagnosis not present

## 2020-08-27 DIAGNOSIS — R310 Gross hematuria: Secondary | ICD-10-CM | POA: Diagnosis not present

## 2020-08-27 DIAGNOSIS — M6281 Muscle weakness (generalized): Secondary | ICD-10-CM | POA: Diagnosis not present

## 2020-08-27 NOTE — Therapy (Signed)
[facility] [place] [address]  [place], [place], [address] Phone: [phone]   Fax:  [phone]  Physical Therapy Treatment  Patient Details  Name: Deanna Rowe MRN: [id number] Date of Birth: [birth year] Referring Provider (PT): Rod Can, MD   Encounter Date: 08/27/2020   PT End of Session - 08/27/20 1652    Visit Number 12    Number of Visits 24    Date for PT Re-Evaluation 10/08/20    Authorization Type [facility]    PT Start Time 1602    PT Stop Time 1650    PT Time Calculation (min) 48 min    Equipment Utilized During Treatment Gait belt    Activity Tolerance Patient tolerated treatment well    Behavior During Therapy [facility] for tasks assessed/performed           Past Medical History:  Diagnosis Date  . Anemia   . Anxiety   . Arthritis    L HIP  . Asthma   . Avascular necrosis of hip (HCC)    LEFT  . Back pain   . Chest pain   . Constipation   . Depression   . Diabetes mellitus   . Diabetes mellitus, type II ([place])   . Dry cough   . Edema, lower extremity   . Gait abnormality 11/18/2019  . GERD (gastroesophageal reflux disease)   . High cholesterol   . Hypertension   . Insomnia    takes Ambien nightly  . Joint pain   . Kidney disease   . Kidney disease   . Obesity   . Parkinsonism ([place]) 03/02/2017  . PONV (postoperative nausea and vomiting)   . RLS (restless legs syndrome) 01/15/2018  . Sleep apnea   . Swallowing difficulty   . Thyroid disease   . TIA (transient ischemic attack) 2012   no residual problems  . Urgency incontinence   . Vitamin D deficiency     Past Surgical History:  Procedure Laterality Date  . DILATION AND CURETTAGE OF UTERUS    . ESOPHAGEAL MANOMETRY N/A 09/03/2012   Procedure: ESOPHAGEAL MANOMETRY (EM);  Surgeon: Garlan Fair, MD;  Location: [facility];  Service: Endoscopy;  Laterality: N/A;  . ESOPHAGEAL MANOMETRY N/A 06/19/2017   Procedure: ESOPHAGEAL  MANOMETRY (EM);  Surgeon: Clarene Essex, MD;  Location: [facility];  Service: Endoscopy;  Laterality: N/A;  . EXPLORATORY LAPAROTOMY    . GANGLION CYST EXCISION    . JOINT REPLACEMENT  2011   rt total hip  . KNEE ARTHROSCOPY    . PILONIDAL CYST / SINUS EXCISION    . TOTAL HIP ARTHROPLASTY Left 10/09/2013   Procedure: LEFT TOTAL HIP ARTHROPLASTY ANTERIOR APPROACH;  Surgeon: [name], MD;  Location: [facility];  Service: Orthopedics;  Laterality: Left;    There were no vitals filed for this visit.   Subjective Assessment - 08/27/20 1601    Subjective Feeling a bit better than last session. Took some Tylenol before this appointment. Not sure what flared it up last session. Saw her Neurologist today who told her "you don't even look like you have Parkinsons." Admits to having had a fall while on a cruise sometime between May 18-23. Notes that she hit her head and L hip trying to keep up with her husband. Denies lasting injury or HA from this fall.    Pertinent History TIA, thyroid disease, Parkinsonism, kidney disease, HTN, HLD, GERD, DMII, depression, L hip avascular necrosis,  anxiety, anemia, R THA 2011, L THA 2015, knee arthroscopy    Diagnostic tests per patient- R hip xray was clear    Patient Stated Goals decrease pain    Currently in Pain? Yes    Pain Score 5     Pain Location Hip    Pain Orientation Right;Lateral    Pain Descriptors / Indicators Sharp    Pain Type Acute pain              [facility] PT Assessment - 08/27/20 0001      Assessment   Medical Diagnosis Contusion of R hip    Referring Provider (PT) Brian Swinteck, MD    Onset Date/Surgical Date 06/15/20      Observation/Other Assessments   Focus on Therapeutic Outcomes (FOTO)  Hip: 53      AROM   Right Hip Flexion 100   pain   Right Hip External Rotation  29   pain   Right Hip Internal Rotation  30      Strength   Right Hip Flexion 4+/5    Right Hip Extension 4/5    Right Hip External Rotation  4+/5    Right  Hip Internal Rotation 4+/5    Right Hip ABduction 4+/5    Right Hip ADduction 4+/5    Left Hip Flexion 4+/5    Left Hip Extension 4+/5    Left Hip External Rotation 4/5    Left Hip Internal Rotation 4/5    Left Hip ABduction 4+/5    Left Hip ADduction 4+/5    Right Knee Flexion 4+/5    Right Knee Extension 4/5    Left Knee Flexion 4+/5    Left Knee Extension 5/5    Right Ankle Dorsiflexion 4+/5    Right Ankle Plantar Flexion 4+/5    Left Ankle Dorsiflexion 4+/5    Left Ankle Plantar Flexion 4+/5      Berg Balance Test   Sit to Stand Able to stand without using hands and stabilize independently    Standing Unsupported Able to stand safely 2 minutes    Sitting with Back Unsupported but Feet Supported on Floor or Stool Able to sit safely and securely 2 minutes    Stand to Sit Sits safely with minimal use of hands    Transfers Able to transfer safely, minor use of hands    Standing Unsupported with Eyes Closed Able to stand 10 seconds safely    Standing Unsupported with Feet Together Able to place feet together independently and stand 1 minute safely    From Standing, Reach Forward with Outstretched Arm Can reach forward >5 cm safely (2")    From Standing Position, Pick up Object from Floor Able to pick up shoe safely and easily    From Standing Position, Turn to Look Behind Over each Shoulder Looks behind one side only/other side shows less weight shift    Turn 360 Degrees Able to turn 360 degrees safely one side only in 4 seconds or less    Standing Unsupported, Alternately Place Feet on Step/Stool Able to complete 4 steps without aid or supervision    Standing Unsupported, One Foot in Front Needs help to step but can hold 15 seconds    Standing on One Leg Able to lift leg independently and hold equal to or more than 3 seconds    Total Score 45                           [facility] PT Treatment/Exercise - 08/27/20 0001      Knee/Hip Exercises: Aerobic   Nustep L4 x 6  min (UEs/LEs)                  PT Education - 08/27/20 1651    Education Details update to HEP; discussion on need for consistent SPC use inside the home for max safety and fall prevention    Person(s) Educated Patient    Methods Explanation;Demonstration;Tactile cues;Verbal cues;Handout    Comprehension Verbalized understanding;Returned demonstration            PT Short Term Goals - 08/27/20 1700      PT SHORT TERM GOAL #1   Title Patient to be independent with initial HEP.    Status Achieved   07/30/20   Target Date 07/20/20             PT Long Term Goals - 08/27/20 1700      PT LONG TERM GOAL #1   Title Patient to be independent with advanced HEP.    Time 6    Period Weeks    Status Partially Met   met for current   Target Date 10/08/20      PT LONG TERM GOAL #2   Title Patient to demonstrate R hip AROM WFL and without pain limiting.    Time 6    Period Weeks    Status Partially Met   improved in R hip ER; pain still persists   Target Date 10/08/20      PT LONG TERM GOAL #3   Title Patient to demonstrate R LE strength >/=4+/5.    Time 6    Period Weeks    Status Partially Met   considerably improved; mild remaining weakness remains   Target Date 10/08/20      PT LONG TERM GOAL #4   Title Patient to report tolerance of 1 hour on her feet without pain limiting.    Time 6    Period Weeks    Status Partially Met   reporting tolerance for 5 minutes befor eonset of pain; 30 minutes before having to sit   Target Date 10/08/20      PT LONG TERM GOAL #5   Title Patient to score 48/56 on Berg in order to decrease risk of falls.    Time 6    Period Weeks    Status Partially Met   5/26/22Merrilee Jansky = 45/56   Target Date 10/08/20                 Plan - 08/27/20 1653    Clinical Impression Statement Patient arrived to session with report of improved pain levels in R hip since last session. Also notes that she had a f/u with her Neurologist today who  was quite pleased with her progress. Upon discussion with patient, she admitted to having had another fall while on a cruise between May 18-23. Notes that she hit her head and L hip trying to keep up with her husband but denies lasting injury or HA from this fall. Strength testing today revealed considerable improvement in R hip, L hip, and R knee flexion strength. Mild remaining weakness still present. R hip ER AROM has improved, however pain still remains; other planes grossly unchanged. Patient scored 45/56 on Berg today, with most challenge demonstrating with standing trunk rotation, SLS, tandem balance. Notes that she is able to walk 5 minutes before onset of pain and that she is  able to walk 30 minutes before having to sit on her 4WW. Considering multiple falls in a short span of time, discussed need for patient to use SPC consistently in the home as she admits to most of her falls occurring when not using it. Patient reported understanding. Patient does report good benefit from PT thus far and is showing good progress towards goals. Would benefit from continued skilled PT services 2x/week for 6 weeks to address remaining goals and decrease falls risk.    Comorbidities TIA, thyroid disease, Parkinsonism, kidney disease, HTN, HLD, GERD, DMII, depression, L hip avascular necrosis, anxiety, anemia, R THA 2011, L THA 2015, knee arthroscopy    Rehab Potential Good    PT Frequency 2x / week    PT Duration 6 weeks    PT Treatment/Interventions ADLs/Self Care Home Management;Cryotherapy;Electrical Stimulation;Iontophoresis 4mg/ml Dexamethasone;Moist Heat;Balance training;Therapeutic exercise;Therapeutic activities;Functional mobility training;Stair training;Gait training;Ultrasound;Neuromuscular re-education;Patient/family education;Manual techniques;Taping;Energy conservation;Dry needling;Passive range of motion;Scar mobilization    PT Next Visit Plan progress R hip AROM and R LE strengthening to tolerance; STS  transfers, balance training with narrow BOS & rotation; review seated & standing PWR! moves & progress to other positions as indicated    Consulted and Agree with Plan of Care Patient           Patient will benefit from skilled therapeutic intervention in order to improve the following deficits and impairments:  Abnormal gait,Decreased activity tolerance,Decreased strength,Increased fascial restricitons,Pain,Decreased balance,Difficulty walking,Improper body mechanics,Decreased range of motion,Decreased safety awareness,Postural dysfunction,Impaired flexibility  Visit Diagnosis: Pain in right hip  Stiffness of right hip, not elsewhere classified  Muscle weakness (generalized)  Difficulty in walking, not elsewhere classified  Unsteadiness on feet     Problem List Patient Active Problem List   Diagnosis Date Noted  . Allergic conjunctivitis of both eyes 07/06/2020  . Gait abnormality 11/18/2019  . OSA on CPAP 06/04/2019  . Pain in joint of left shoulder 03/28/2019  . Seasonal allergic conjunctivitis 02/07/2019  . Chronic intermittent hypoxia with obstructive sleep apnea 01/25/2019  . Severe obstructive sleep apnea 01/25/2019  . Excessive daytime sleepiness 01/03/2019  . Non-restorative sleep 01/03/2019  . Nocturia more than twice per night 01/03/2019  . Weight gain, abnormal 01/03/2019  . Snorings 01/03/2019  . Trochanteric bursitis of right hip 04/27/2018  . Encounter for routine screening for malformation using ultrasonics 02/08/2018  . Abnormal weight gain 02/08/2018  . Acid indigestion 02/08/2018  . Acute antritis 02/08/2018  . Class 2 severe obesity with serious comorbidity and body mass index (BMI) of 39.0 to 39.9 in adult (HCC) 02/08/2018  . Perennial allergic rhinitis 02/08/2018  . Antiphospholipid syndrome (HCC) 02/08/2018  . Arthralgia of hip or thigh 02/08/2018  . Asthma, cough variant 02/08/2018  . Vitamin D deficiency 02/08/2018  . Backhand tennis elbow  02/08/2018  . Cannot sleep 02/08/2018  . Chronic kidney disease (CKD), stage III (moderate) (HCC) 02/08/2018  . Current drug use 02/08/2018  . Depression, major, single episode, in partial remission (HCC) 02/08/2018  . Diabetes mellitus with polyneuropathy (HCC) 02/08/2018  . Difficulty hearing 02/08/2018  . Disturbance of skin sensation 02/08/2018  . Hypertension associated with type 2 diabetes mellitus (HCC) 02/08/2018  . Extremity pain 02/08/2018  . Facet syndrome, lumbar 02/08/2018  . Factor V Leiden (HCC) 02/08/2018  . Fungal infection of nail 02/08/2018  . Gastroenteritis and colitis, viral 02/08/2018  . Hypoglycemia 02/08/2018  . Menopause 02/08/2018  . Other long term (current) drug therapy 02/08/2018  . Other osteonecrosis, unspecified femur (HCC)   02/08/2018  . Pure hypercholesterolemia 02/08/2018  . Recurrent major depressive episodes (HCC) 02/08/2018  . Syncope and collapse 02/08/2018  . Moderate persistent asthma 02/08/2018  . Cough, persistent 02/08/2018  . Restless leg syndrome 01/15/2018  . Degeneration of lumbar intervertebral disc 11/21/2017  . Tremor of right hand 03/02/2017  . Parkinson disease (HCC) 03/02/2017  . UTI (urinary tract infection) 12/01/2015  . Type 2 diabetes mellitus with stage 3b chronic kidney disease, with long-term current use of insulin (HCC) 06/03/2015  . Fast heart beat 04/09/2015  . 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 2 diabetes mellitus (HCC) 04/24/2014  . Postoperative anemia due to acute blood loss 10/10/2013  . Avascular necrosis of bone of left hip (HCC) 10/09/2013  . Arthritis of pelvic region, degenerative 10/09/2013  . Breath shortness 06/18/2013  . Diabetes mellitus type 2, uncontrolled (HCC) 05/31/2013  . Intermittent claudication (HCC) 04/04/2013  . Anxiety   . Gastroesophageal reflux disease 09/12/2012  . Clinical depression 02/21/2012  . Stroke (HCC) 02/11/2012  . History of revision of total replacement of right hip joint 04/04/2009       , PT, DPT 08/27/20 5:03 PM   [facility] [address]  [address] [place], [place], [address] Phone: [phone]   Fax:  [phone]  Name: Deanna Rowe MRN: [id number] Date of Birth: 04/14/1955   

## 2020-08-28 ENCOUNTER — Other Ambulatory Visit (HOSPITAL_BASED_OUTPATIENT_CLINIC_OR_DEPARTMENT_OTHER): Payer: Self-pay

## 2020-09-02 ENCOUNTER — Other Ambulatory Visit (HOSPITAL_BASED_OUTPATIENT_CLINIC_OR_DEPARTMENT_OTHER): Payer: Self-pay

## 2020-09-02 ENCOUNTER — Ambulatory Visit (INDEPENDENT_AMBULATORY_CARE_PROVIDER_SITE_OTHER): Payer: 59 | Admitting: Family Medicine

## 2020-09-02 MED FILL — Ropinirole Hydrochloride Tab 2 MG: ORAL | 90 days supply | Qty: 180 | Fill #0 | Status: AC

## 2020-09-02 MED FILL — Solifenacin Succinate Tab 10 MG: ORAL | 30 days supply | Qty: 30 | Fill #2 | Status: AC

## 2020-09-02 NOTE — Patient Instructions (Addendum)
Asthma Start prednisone 10 mg taking 2 tablets twice a day for 3 days, then on the fourth day take 2 tablets in the morning, on the fifth day take 1 tablet and stop.  Make sure to check your blood sugar while taking prednisone.   Continue montelukast 10 mg once a day to prevent cough or wheeze Continue Symbicort 160/4.5 mcg-2 puffs twice a day with a spacer to prevent cough or wheeze Continue albuterol 2 puffs every 4 hours as needed for cough or wheeze For asthma flare, add Flovent 220-2 puffs twice a day for 2 weeks or until cough and wheeze free.  Continue Xolair injections 300 mg once every 4 weeks and have access to an epinephrine auto-injector set.  Allergic rhinitis Continue Nasacort 2 sprays in each nostril once a day as needed for stuffy nose Consider saline nasal rinses as needed for nasal symptoms. Use this before any medicated nasal sprays for best result Continue azelastine nasal spray 2 sprays in each nostril twice a day as needed for nasal symptoms Continue Xyzal 5 mg once a day only as needed for a runny nose or itch Continue allergen avoidance measures directed toward dust mites, cat, and dog as listed below  Reflux Continue Dexilant 60 mg once a day to control reflux. Continue dietary and lifestyle modifications  Recommend follow up with gastroenterologist for further evaluation  Allergic conjunctivitis Some over the counter eye drops include Pataday one drop in each eye once a day as needed for red, itchy eyes OR Zaditor one drop in each eye twice a day as needed for red itchy eyes.  Please let us know if this treatment plan is not working well for you  Follow up in 2  months or sooner if needed.

## 2020-09-03 ENCOUNTER — Other Ambulatory Visit: Payer: Self-pay

## 2020-09-03 ENCOUNTER — Encounter (INDEPENDENT_AMBULATORY_CARE_PROVIDER_SITE_OTHER): Payer: Self-pay | Admitting: Family Medicine

## 2020-09-03 ENCOUNTER — Ambulatory Visit (INDEPENDENT_AMBULATORY_CARE_PROVIDER_SITE_OTHER): Payer: 59 | Admitting: Family Medicine

## 2020-09-03 ENCOUNTER — Other Ambulatory Visit (HOSPITAL_COMMUNITY): Payer: Self-pay

## 2020-09-03 ENCOUNTER — Other Ambulatory Visit (HOSPITAL_BASED_OUTPATIENT_CLINIC_OR_DEPARTMENT_OTHER): Payer: Self-pay

## 2020-09-03 ENCOUNTER — Ambulatory Visit (INDEPENDENT_AMBULATORY_CARE_PROVIDER_SITE_OTHER): Payer: 59 | Admitting: Family

## 2020-09-03 ENCOUNTER — Encounter: Payer: Self-pay | Admitting: Family

## 2020-09-03 VITALS — BP 114/76 | HR 81 | Temp 98.7°F | Ht 66.0 in | Wt 211.0 lb

## 2020-09-03 VITALS — BP 100/66 | HR 75 | Temp 97.6°F | Resp 18

## 2020-09-03 DIAGNOSIS — J4541 Moderate persistent asthma with (acute) exacerbation: Secondary | ICD-10-CM

## 2020-09-03 DIAGNOSIS — J3089 Other allergic rhinitis: Secondary | ICD-10-CM

## 2020-09-03 DIAGNOSIS — Z6839 Body mass index (BMI) 39.0-39.9, adult: Secondary | ICD-10-CM

## 2020-09-03 DIAGNOSIS — H1013 Acute atopic conjunctivitis, bilateral: Secondary | ICD-10-CM | POA: Diagnosis not present

## 2020-09-03 DIAGNOSIS — E1122 Type 2 diabetes mellitus with diabetic chronic kidney disease: Secondary | ICD-10-CM | POA: Diagnosis not present

## 2020-09-03 DIAGNOSIS — Z794 Long term (current) use of insulin: Secondary | ICD-10-CM

## 2020-09-03 DIAGNOSIS — K219 Gastro-esophageal reflux disease without esophagitis: Secondary | ICD-10-CM | POA: Diagnosis not present

## 2020-09-03 DIAGNOSIS — N1832 Chronic kidney disease, stage 3b: Secondary | ICD-10-CM

## 2020-09-03 DIAGNOSIS — H1010 Acute atopic conjunctivitis, unspecified eye: Secondary | ICD-10-CM

## 2020-09-03 NOTE — Progress Notes (Signed)
[address] [place] [address] Dept: [phone]  FOLLOW UP NOTE  Patient ID: Deanna Rowe, female    DOB: [birth year]  Age: 65 y.o. MRN: [id number] Date of Office Visit: 09/03/2020  Assessment  Chief Complaint: Cough  HPI Deanna Rowe is a 65 year old female who presents today for an acute visit.  She was last seen on July 06, 2020 by Gareth Morgan, FNP for moderate persistent asthma, perennial allergic rhinitis, allergic conjunctivitis, and gastroesophageal reflux disease.  Moderate persistent asthma is reported as not well controlled with montelukast 10 mg once a day, Symbicort 160/4.5 mcg 2 puffs twice a day, albuterol as needed, Xolair 300 mg every 28 days, and Flovent 220 mcg 2 puffs twice day for asthma flares.  She reports that on Monday she started having a productive cough with clear sputum, wheezing, and shortness of breath at times.  She denies any tightness in her chest, fever, or chills.  She also reports nocturnal awakenings due to breathing problems.  Since her last office visit she has not required any systemic steroids or made any trips to the emergency room or urgent care due to breathing problems.  She reports that she has been using her albuterol inhaler at least once a day since Monday and this does help.  She reports that she now gets her Xolair injections at home and that her husband gives them to her.  Her last Xolair injection was approximately 3 weeks ago.  Up until this past Monday she had felt like her Xolair injections have been helping her breathing.  She mentions that she started her Flovent 220 mcg 2 puffs twice a day 4 weeks ago when she started having some breathing problems. Instructed her to stop Flovent 220 mcg for now since we are giving her oral steroids and to restart at her next flareup.  Allergic rhinitis is reported as moderately controlled with Nasacort nasal spray as needed, saline nasal rinses as needed, azelastine nasal spray as needed  and Xyzal 5 mg once a day as needed.  She reports postnasal drip and denies rhinorrhea, nasal congestion, and sinus tenderness.  She has not had any sinus infections since we last saw her.  Reflux is reported as moderately controlled with Dexilant 60 mg once a day.  She reports that some days her heartburn and reflux are bad and some days they are good.  She reports that she has not made a follow-up with her gastroenterologist.  Allergic conjunctivitis is reported as controlled.  She denies any itchy watery eyes.   Drug Allergies:  Allergies  Allergen Reactions  . Fluticasone-Salmeterol Anaphylaxis  . Codeine Nausea And Vomiting  . Fetzima [Levomilnacipran] Nausea And Vomiting  . Nsaids Other (See Comments)    Upset stomach   . Trulicity [Dulaglutide] Nausea And Vomiting    Significant and undesirably rapid (40 lbs) weight loss   . Xanax [Alprazolam] Other (See Comments)    disoriented  . Amoxicillin Rash    Has patient had a PCN reaction causing immediate rash, facial/tongue/throat swelling, SOB or lightheadedness with hypotension: Yes Has patient had a PCN reaction causing severe rash involving mucus membranes or skin necrosis: No Has patient had a PCN reaction that required hospitalization: No Has patient had a PCN reaction occurring within the last 10 years: No If all of the above answers are "NO", then may proceed with Cephalosporin use.   . Pseudoephedrine Hcl Er Palpitations    Review of Systems: Review of Systems  Constitutional: Negative for chills and fever.  HENT:       Reports postnasal drip and denies rhinorrhea and nasal congestion  Eyes:       Denies itchy watery eyes  Respiratory: Positive for cough, shortness of breath and wheezing.        Reports productive cough with clear sputum  Cardiovascular: Negative for chest pain and palpitations.  Gastrointestinal: Positive for heartburn.       Reports heartburn and reflux there is some days good and some days bad.   Genitourinary: Negative for dysuria.  Skin: Negative for itching and rash.  Neurological: Negative for headaches.  Endo/Heme/Allergies: Positive for environmental allergies.    Physical Exam: BP 100/66   Pulse 75   Temp 97.6 F (36.4 C) (Temporal)   Resp 18   SpO2 97%    Physical Exam Constitutional:      Appearance: Normal appearance.  HENT:     Head: Normocephalic and atraumatic.     Comments: Pharynx normal, eyes normal, ears normal, nose normal    Right Ear: Tympanic membrane, ear canal and external ear normal.     Left Ear: Tympanic membrane, ear canal and external ear normal.     Nose: Nose normal.     Mouth/Throat:     Mouth: Mucous membranes are moist.     Pharynx: Oropharynx is clear.  Eyes:     Conjunctiva/sclera: Conjunctivae normal.  Cardiovascular:     Rate and Rhythm: Regular rhythm.     Heart sounds: Normal heart sounds.  Pulmonary:     Effort: Pulmonary effort is normal.     Breath sounds: Normal breath sounds.     Comments: Lungs clear to auscultation Musculoskeletal:     Cervical back: Neck supple.  Skin:    General: Skin is warm.  Neurological:     Mental Status: She is alert and oriented to person, place, and time.  Psychiatric:        Mood and Affect: Mood normal.        Behavior: Behavior normal.        Thought Content: Thought content normal.        Judgment: Judgment normal.     Diagnostics: FVC 1.98 L, FEV1 1.74 L.  Predicted FVC 2.75 L, predicted FEV1 2.15 L.  Spirometry indicates normal respiratory function. Assessment and Plan: 1. Moderate persistent asthma with acute exacerbation   2. Perennial allergic rhinitis   3. Gastroesophageal reflux disease, unspecified whether esophagitis present   4. Seasonal allergic conjunctivitis     No orders of the defined types were placed in this encounter.   Patient Instructions  Asthma Start prednisone 10 mg taking 2 tablets twice a day for 3 days, then on the fourth day take 2 tablets in  the morning, on the fifth day take 1 tablet and stop.  Make sure to check your blood sugar while taking prednisone.   Continue montelukast 10 mg once a day to prevent cough or wheeze Continue Symbicort 160/4.5 mcg-2 puffs twice a day with a spacer to prevent cough or wheeze Continue albuterol 2 puffs every 4 hours as needed for cough or wheeze For asthma flare, add Flovent 220-2 puffs twice a day for 2 weeks or until cough and wheeze free.  Continue Xolair injections 300 mg once every 4 weeks and have access to an epinephrine auto-injector set.  Allergic rhinitis Continue Nasacort 2 sprays in each nostril once a day as needed for stuffy nose Consider saline nasal rinses  as needed for nasal symptoms. Use this before any medicated nasal sprays for best result Continue azelastine nasal spray 2 sprays in each nostril twice a day as needed for nasal symptoms Continue Xyzal 5 mg once a day only as needed for a runny nose or itch Continue allergen avoidance measures directed toward dust mites, cat, and dog as listed below  Reflux Continue Dexilant 60 mg once a day to control reflux. Continue dietary and lifestyle modifications  Recommend follow up with gastroenterologist for further evaluation  Allergic conjunctivitis Some over the counter eye drops include Pataday one drop in each eye once a day as needed for red, itchy eyes OR Zaditor one drop in each eye twice a day as needed for red itchy eyes.  Please let us know if this treatment plan is not working well for you  Follow up in 2  months or sooner if needed.        Return in about 2 months (around 11/03/2020), or if symptoms worsen or fail to improve.    Thank you for the opportunity to care for this patient.  Please do not hesitate to contact me with questions.  Althea Charon, FNP [facility]

## 2020-09-04 ENCOUNTER — Other Ambulatory Visit (HOSPITAL_BASED_OUTPATIENT_CLINIC_OR_DEPARTMENT_OTHER): Payer: Self-pay

## 2020-09-04 MED ORDER — MYRBETRIQ 50 MG PO TB24
ORAL_TABLET | ORAL | 6 refills | Status: DC
  Filled 2020-09-04: qty 30, 30d supply, fill #0
  Filled 2020-10-01: qty 30, 30d supply, fill #1

## 2020-09-07 ENCOUNTER — Other Ambulatory Visit (HOSPITAL_COMMUNITY): Payer: Self-pay

## 2020-09-07 NOTE — Addendum Note (Signed)
Addended by: Felipa Emory on: 09/07/2020 02:16 PM   Modules accepted: Orders

## 2020-09-08 ENCOUNTER — Other Ambulatory Visit: Payer: Self-pay

## 2020-09-08 ENCOUNTER — Encounter (INDEPENDENT_AMBULATORY_CARE_PROVIDER_SITE_OTHER): Payer: Self-pay | Admitting: Family Medicine

## 2020-09-08 ENCOUNTER — Ambulatory Visit: Payer: 59 | Attending: Orthopedic Surgery

## 2020-09-08 DIAGNOSIS — M25651 Stiffness of right hip, not elsewhere classified: Secondary | ICD-10-CM | POA: Diagnosis not present

## 2020-09-08 DIAGNOSIS — R262 Difficulty in walking, not elsewhere classified: Secondary | ICD-10-CM | POA: Diagnosis not present

## 2020-09-08 DIAGNOSIS — M25551 Pain in right hip: Secondary | ICD-10-CM | POA: Diagnosis not present

## 2020-09-08 DIAGNOSIS — M6281 Muscle weakness (generalized): Secondary | ICD-10-CM | POA: Insufficient documentation

## 2020-09-08 DIAGNOSIS — R2681 Unsteadiness on feet: Secondary | ICD-10-CM | POA: Diagnosis not present

## 2020-09-08 NOTE — Progress Notes (Signed)
Chief Complaint:   OBESITY Deanna Rowe is here to discuss her progress with her obesity treatment plan along with follow-up of her obesity related diagnoses. Deanna Rowe is on the Category 3 Plan and states she is following her eating plan approximately 50% of the time. Deanna Rowe states she is not currently exercising.   Today's visit was #: 26 Starting weight: 237 lbs Starting date: 04/16/2019 Today's weight: 211 lbs Today's date: 09/03/2020 Total lbs lost to date: 26 lbs Total lbs lost since last in-office visit: 11  Interim History: Deanna Rowe has not had an OV since 07/07/2020. She is down 11 lbs today. Her appetite is well controlled with Ozempic. She was placed on prednisone by her asthma doctor this morning.  She is skipping lunch most days.  Subjective:   1. Type 2 diabetes mellitus with stage 3b chronic kidney disease, with long-term current use of insulin ([place]) Diabetes is managed by Dr. Buddy Duty. Well controlled. Deanna Rowe's last A1c was 7.0 (February 2022). CBG is in the 90's. She is on Humalog 14-12-16 and 44 units of basal insulin. She is on Ozempic 0.5 mg weekly.  Lab Results  Component Value Date   HGBA1C 7.9 (H) 04/16/2019   HGBA1C 6.9 11/25/2016   HGBA1C 6.0 06/10/2016   Lab Results  Component Value Date   MICROALBUR <0.7 02/12/2016   LDLCALC 57 04/16/2019   CREATININE 1.43 (H) 03/02/2020   Lab Results  Component Value Date   INSULIN 12.3 04/16/2019    Assessment/Plan:   1. Type 2 diabetes mellitus with stage 3b chronic kidney disease, with long-term current use of insulin (HCC) -Continue Ozempic 0.5 mg and insulin. F/u with Dr. Buddy Duty.  2. Obesity: BMI 34.07  Deanna Rowe is currently in the action stage of change. As such, her goal is to continue with weight loss efforts. She has agreed to the Category 3 Plan.   Discussed she must eat at least the sandwich at lunch for adequate protein.  Exercise goals: No exercise has been prescribed at this time.  Behavioral  modification strategies: no skipping meals.  Deanna Rowe has agreed to follow-up with our clinic in 4 weeks.   Objective:   Blood pressure 114/76, pulse 81, temperature 98.7 F (37.1 C), height 5\' 6"  (1.676 m), weight 211 lb (95.7 kg), SpO2 100 %. Body mass index is 34.06 kg/m.  General: Cooperative, alert, well developed, in no acute distress. HEENT: Conjunctivae and lids unremarkable. Cardiovascular: Regular rhythm.  Lungs: Normal work of breathing. Neurologic: No focal deficits.   Lab Results  Component Value Date   CREATININE 1.43 (H) 03/02/2020   BUN 13 03/02/2020   NA 136 03/02/2020   K 4.7 03/02/2020   CL 98 03/02/2020   CO2 27 03/02/2020   Lab Results  Component Value Date   ALT 21 04/16/2019   AST 23 04/16/2019   ALKPHOS 58 04/16/2019   BILITOT 0.3 04/16/2019   Lab Results  Component Value Date   HGBA1C 7.9 (H) 04/16/2019   HGBA1C 6.9 11/25/2016   HGBA1C 6.0 06/10/2016   HGBA1C 5.9 02/12/2016   HGBA1C 6.1 12/01/2015   Lab Results  Component Value Date   INSULIN 12.3 04/16/2019   Lab Results  Component Value Date   TSH 1.780 04/16/2019   Lab Results  Component Value Date   CHOL 155 04/16/2019   HDL 84 04/16/2019   LDLCALC 57 04/16/2019   TRIG 71 04/16/2019   Lab Results  Component Value Date   WBC 6.8 04/16/2019   HGB  12.3 04/16/2019   HCT 36.1 04/16/2019   MCV 96 04/16/2019   PLT 274 04/16/2019    Attestation Statements:   Reviewed by clinician on day of visit: allergies, medications, problem list, medical history, surgical history, family history, social history, and previous encounter notes.  Coral Ceo, CMA, am acting as Location manager for [employer], [place].  I have reviewed the above documentation for accuracy and completeness, and I agree with the above. -  Georgianne Fick, FNP

## 2020-09-08 NOTE — Therapy (Signed)
[facility] [address]  [place], [place], [address] Phone: [phone]   Fax:  [phone]  Physical Therapy Treatment  Patient Details  Name: Deanna Rowe MRN: [id number] Date of Birth: 1956-03-11 Referring Provider (PT): Rod Can, MD   Encounter Date: 09/08/2020   PT End of Session - 09/08/20 1714    Visit Number 13    Number of Visits 24    Date for PT Re-Evaluation 10/08/20    Authorization Type [facility]    PT Start Time 1623    PT Stop Time 1705    PT Time Calculation (min) 42 min    Activity Tolerance Patient tolerated treatment well    Behavior During Therapy [facility] for tasks assessed/performed           Past Medical History:  Diagnosis Date  . Anemia   . Anxiety   . Arthritis    L HIP  . Asthma   . Avascular necrosis of hip (HCC)    LEFT  . Back pain   . Chest pain   . Constipation   . Depression   . Diabetes mellitus   . Diabetes mellitus, type II ([place])   . Dry cough   . Edema, lower extremity   . Gait abnormality 11/18/2019  . GERD (gastroesophageal reflux disease)   . High cholesterol   . Hypertension   . Insomnia    takes Ambien nightly  . Joint pain   . Kidney disease   . Kidney disease   . Obesity   . Parkinsonism ([place]) 03/02/2017  . PONV (postoperative nausea and vomiting)   . RLS (restless legs syndrome) 01/15/2018  . Sleep apnea   . Swallowing difficulty   . Thyroid disease   . TIA (transient ischemic attack) 2012   no residual problems  . Urgency incontinence   . Vitamin D deficiency     Past Surgical History:  Procedure Laterality Date  . DILATION AND CURETTAGE OF UTERUS    . ESOPHAGEAL MANOMETRY N/A 09/03/2012   Procedure: ESOPHAGEAL MANOMETRY (EM);  Surgeon: Garlan Fair, MD;  Location: [facility];  Service: Endoscopy;  Laterality: N/A;  . ESOPHAGEAL MANOMETRY N/A 06/19/2017   Procedure: ESOPHAGEAL MANOMETRY (EM);  Surgeon: Clarene Essex, MD;   Location: [facility];  Service: Endoscopy;  Laterality: N/A;  . EXPLORATORY LAPAROTOMY    . GANGLION CYST EXCISION    . JOINT REPLACEMENT  2011   rt total hip  . KNEE ARTHROSCOPY    . PILONIDAL CYST / SINUS EXCISION    . TOTAL HIP ARTHROPLASTY Left 10/09/2013   Procedure: LEFT TOTAL HIP ARTHROPLASTY ANTERIOR APPROACH;  Surgeon: [name], MD;  Location: [facility];  Service: Orthopedics;  Laterality: Left;    There were no vitals filed for this visit.   Subjective Assessment - 09/08/20 1627    Subjective Pt reports that her R hip has gotten better, Parkinson's is acting up because she feels like her "feet don't want to go".    Pertinent History TIA, thyroid disease, Parkinsonism, kidney disease, HTN, HLD, GERD, DMII, depression, L hip avascular necrosis, anxiety, anemia, R THA 2011, L THA 2015, knee arthroscopy    Diagnostic tests per patient- R hip xray was clear    Patient Stated Goals decrease pain    Currently in Pain? No/denies  [place] [facility] Adult PT Treatment/Exercise - 09/08/20 0001      Knee/Hip Exercises: Aerobic   Recumbent Bike L2 x 6 min      Knee/Hip Exercises: Standing   Hip Abduction Stengthening;Both;10 reps;Knee straight    Abduction Limitations looped yellow TB at ankles; UE support on counter    Hip Extension Stengthening;Both;10 reps;Knee straight    Extension Limitations looped yellow TB at ankles; UE support on counter    Forward Step Up Both;2 sets;10 reps;Hand Hold: 2;Step Height: 6"    Forward Step Up Limitations hesitancy d/t fear of falling, supervision required for safety      Knee/Hip Exercises: Seated   Long Arc Quad Strengthening;Both;2 sets;10 reps;Weights    Long Arc Quad Weight 2 lbs.    Marching Strengthening;Both;10 reps;Weights    Marching Limitations 2# weights      Knee/Hip Exercises: Supine   Bridges Strengthening;Both;10 reps    Bridges Limitations cues for equal WS    Straight Leg Raises  Strengthening;Right;5 reps    Other Supine Knee/Hip Exercises red TB clamshells 10 reps                  PT Education - 09/08/20 1714    Education Details Instruction on gait training with cane for safety.    Person(s) Educated Patient    Methods Explanation;Demonstration    Comprehension Verbalized understanding;Returned demonstration;Need further instruction            PT Short Term Goals - 08/27/20 1700      PT SHORT TERM GOAL #1   Title Patient to be independent with initial HEP.    Status Achieved   07/30/20   Target Date 07/20/20             PT Long Term Goals - 08/27/20 1700      PT LONG TERM GOAL #1   Title Patient to be independent with advanced HEP.    Time 6    Period Weeks    Status Partially Met   met for current   Target Date 10/08/20      PT LONG TERM GOAL #2   Title Patient to demonstrate R hip AROM WFL and without pain limiting.    Time 6    Period Weeks    Status Partially Met   improved in R hip ER; pain still persists   Target Date 10/08/20      PT LONG TERM GOAL #3   Title Patient to demonstrate R LE strength >/=4+/5.    Time 6    Period Weeks    Status Partially Met   considerably improved; mild remaining weakness remains   Target Date 10/08/20      PT LONG TERM GOAL #4   Title Patient to report tolerance of 1 hour on her feet without pain limiting.    Time 6    Period Weeks    Status Partially Met   reporting tolerance for 5 minutes befor eonset of pain; 30 minutes before having to sit   Target Date 10/08/20      PT LONG TERM GOAL #5   Title Patient to score 48/56 on Berg in order to decrease risk of falls.    Time 6    Period Weeks    Status Partially Met   08/27/20: Berg = 45/56   Target Date 10/08/20                 Plan - 09/08/20 1716    Clinical  Impression Statement Pt reports to session showing decreased SPC use with gait and slight foward trunk lean. She required instruction on proper use of SPC during  gait, keeping the cane in front of her while walking and stepping with the opposite LE first for stability. She was hesitant with the step ups initally but after encouragement she completed them more smoothly. Cues needed for equal WB during the bridges. Also considerable weakness noted with the R LE today, with her showing decreased ROM and standing tolerance on the R leg. Otherwise pt responded well today.    Personal Factors and Comorbidities Age;Comorbidity 3+;Fitness;Past/Current Experience;Time since onset of injury/illness/exacerbation    Comorbidities TIA, thyroid disease, Parkinsonism, kidney disease, HTN, HLD, GERD, DMII, depression, L hip avascular necrosis, anxiety, anemia, R THA 2011, L THA 2015, knee arthroscopy    PT Frequency 2x / week    PT Duration 6 weeks    PT Treatment/Interventions ADLs/Self Care Home Management;Cryotherapy;Electrical Stimulation;Iontophoresis 40m/ml Dexamethasone;Moist Heat;Balance training;Therapeutic exercise;Therapeutic activities;Functional mobility training;Stair training;Gait training;Ultrasound;Neuromuscular re-education;Patient/family education;Manual techniques;Taping;Energy conservation;Dry needling;Passive range of motion;Scar mobilization    PT Next Visit Plan progress R hip AROM and R LE strengthening to tolerance; STS transfers, balance training with narrow BOS & rotation; review seated & standing PWR! moves & progress to other positions as indicated    Consulted and Agree with Plan of Care Patient           Patient will benefit from skilled therapeutic intervention in order to improve the following deficits and impairments:  Abnormal gait,Decreased activity tolerance,Decreased strength,Increased fascial restricitons,Pain,Decreased balance,Difficulty walking,Improper body mechanics,Decreased range of motion,Decreased safety awareness,Postural dysfunction,Impaired flexibility  Visit Diagnosis: Pain in right hip  Stiffness of right hip, not  elsewhere classified  Muscle weakness (generalized)  Difficulty in walking, not elsewhere classified  Unsteadiness on feet     Problem List Patient Active Problem List   Diagnosis Date Noted  . Allergic conjunctivitis of both eyes 07/06/2020  . Gait abnormality 11/18/2019  . OSA on CPAP 06/04/2019  . Pain in joint of left shoulder 03/28/2019  . Seasonal allergic conjunctivitis 02/07/2019  . Chronic intermittent hypoxia with obstructive sleep apnea 01/25/2019  . Severe obstructive sleep apnea 01/25/2019  . Excessive daytime sleepiness 01/03/2019  . Non-restorative sleep 01/03/2019  . Nocturia more than twice per night 01/03/2019  . Weight gain, abnormal 01/03/2019  . Snorings 01/03/2019  . Trochanteric bursitis of right hip 04/27/2018  . Encounter for routine screening for malformation using ultrasonics 02/08/2018  . Abnormal weight gain 02/08/2018  . Acid indigestion 02/08/2018  . Acute antritis 02/08/2018  . Class 2 severe obesity with serious comorbidity and body mass index (BMI) of 39.0 to 39.9 in adult ([place] 02/08/2018  . Perennial allergic rhinitis 02/08/2018  . Antiphospholipid syndrome ([place] 02/08/2018  . Arthralgia of hip or thigh 02/08/2018  . Asthma, cough variant 02/08/2018  . Vitamin D deficiency 02/08/2018  . Backhand tennis elbow 02/08/2018  . Cannot sleep 02/08/2018  . Chronic kidney disease (CKD), stage III (moderate) ([place] 02/08/2018  . Current drug use 02/08/2018  . Depression, major, single episode, in partial remission ([place] 02/08/2018  . Diabetes mellitus with polyneuropathy ([place] 02/08/2018  . Difficulty hearing 02/08/2018  . Disturbance of skin sensation 02/08/2018  . Hypertension associated with type 2 diabetes mellitus ([place] 02/08/2018  . Extremity pain 02/08/2018  . Facet syndrome, lumbar 02/08/2018  . Factor V Leiden ([place] 02/08/2018  . Fungal infection of nail 02/08/2018  . Gastroenteritis and colitis, viral 02/08/2018  . Hypoglycemia  02/08/2018  . Menopause 02/08/2018  . Other long term (current) drug therapy 02/08/2018  . Other osteonecrosis, unspecified femur ([place]) 02/08/2018  . Pure hypercholesterolemia 02/08/2018  . Recurrent major depressive episodes ([place]) 02/08/2018  . Syncope and collapse 02/08/2018  . Moderate persistent asthma 02/08/2018  . Cough, persistent 02/08/2018  . Restless leg syndrome 01/15/2018  . Degeneration of lumbar intervertebral disc 11/21/2017  . Tremor of right hand 03/02/2017  . Parkinson disease ([place]) 03/02/2017  . UTI (urinary tract infection) 12/01/2015  . Type 2 diabetes mellitus with stage 3b chronic kidney disease, with long-term current use of insulin ([place]) 06/03/2015  . Fast heart beat 04/09/2015  . 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 2 diabetes mellitus ([place]) 04/24/2014  . Postoperative anemia due to acute blood loss 10/10/2013  . Avascular necrosis of bone of left hip ([place]) 10/09/2013  . Arthritis of pelvic region, degenerative 10/09/2013  . Breath shortness 06/18/2013  . Diabetes mellitus type 2, uncontrolled ([place]) 05/31/2013  . Intermittent claudication ([place]) 04/04/2013  . Anxiety   . Gastroesophageal reflux disease 09/12/2012  . Clinical depression 02/21/2012  . Stroke ([place]) 02/11/2012  . History of revision of total replacement of right hip joint 04/04/2009    Artist Pais, PTA 09/08/2020, 5:22 PM  [facility] [address]  [place], [place], [address] Phone: [phone]   Fax:  [phone]  Name: Deanna Rowe MRN: [id number] Date of Birth: 17-Dec-1955

## 2020-09-09 ENCOUNTER — Ambulatory Visit: Payer: 59 | Admitting: Physical Therapy

## 2020-09-09 ENCOUNTER — Encounter: Payer: Self-pay | Admitting: Physical Therapy

## 2020-09-09 DIAGNOSIS — R2681 Unsteadiness on feet: Secondary | ICD-10-CM | POA: Diagnosis not present

## 2020-09-09 DIAGNOSIS — M25651 Stiffness of right hip, not elsewhere classified: Secondary | ICD-10-CM

## 2020-09-09 DIAGNOSIS — M6281 Muscle weakness (generalized): Secondary | ICD-10-CM | POA: Diagnosis not present

## 2020-09-09 DIAGNOSIS — R262 Difficulty in walking, not elsewhere classified: Secondary | ICD-10-CM | POA: Diagnosis not present

## 2020-09-09 DIAGNOSIS — M25551 Pain in right hip: Secondary | ICD-10-CM

## 2020-09-09 NOTE — Therapy (Signed)
[facility] [address]  [place], [place], [address] Phone: [phone]   Fax:  [phone]  Physical Therapy Treatment  Patient Details  Name: Deanna Rowe MRN: [id number] Date of Birth: 10/04/1955 Referring Provider (PT): Rod Can, MD   Encounter Date: 09/09/2020   PT End of Session - 09/09/20 1704    Visit Number 14    Number of Visits 24    Date for PT Re-Evaluation 10/08/20    Authorization Type [facility]    PT Start Time 1613    PT Stop Time 1658    PT Time Calculation (min) 45 min    Equipment Utilized During Treatment Gait belt    Activity Tolerance Patient tolerated treatment well    Behavior During Therapy Deanna Rowe for tasks assessed/performed           Past Medical History:  Diagnosis Date  . Anemia   . Anxiety   . Arthritis    L HIP  . Asthma   . Avascular necrosis of hip (HCC)    LEFT  . Back pain   . Chest pain   . Constipation   . Depression   . Diabetes mellitus   . Diabetes mellitus, type II ([place])   . Dry cough   . Edema, lower extremity   . Gait abnormality 11/18/2019  . GERD (gastroesophageal reflux disease)   . High cholesterol   . Hypertension   . Insomnia    takes Ambien nightly  . Joint pain   . Kidney disease   . Kidney disease   . Obesity   . Parkinsonism ([place]) 03/02/2017  . PONV (postoperative nausea and vomiting)   . RLS (restless legs syndrome) 01/15/2018  . Sleep apnea   . Swallowing difficulty   . Thyroid disease   . TIA (transient ischemic attack) 2012   no residual problems  . Urgency incontinence   . Vitamin D deficiency     Past Surgical History:  Procedure Laterality Date  . DILATION AND CURETTAGE OF UTERUS    . ESOPHAGEAL MANOMETRY N/A 09/03/2012   Procedure: ESOPHAGEAL MANOMETRY (EM);  Surgeon: Garlan Fair, MD;  [facility];  Service: Endoscopy;  Laterality: N/A;  . ESOPHAGEAL MANOMETRY N/A 06/19/2017   Procedure: ESOPHAGEAL  MANOMETRY (EM);  Surgeon: Clarene Essex, MD;  [facility];  Service: Endoscopy;  Laterality: N/A;  . EXPLORATORY LAPAROTOMY    . GANGLION CYST EXCISION    . JOINT REPLACEMENT  2011   rt total hip  . KNEE ARTHROSCOPY    . PILONIDAL CYST / SINUS EXCISION    . TOTAL HIP ARTHROPLASTY Left 10/09/2013   Procedure: LEFT TOTAL HIP ARTHROPLASTY ANTERIOR APPROACH;  Surgeon: [name], MD;  [facility];  Service: Orthopedics;  Laterality: Left;    There were no vitals filed for this visit.   Subjective Assessment - 09/09/20 1615    Subjective Feeling better than last week when her hip was "killing me" without known cause. Denies falls since last session.    Pertinent History TIA, thyroid disease, Parkinsonism, kidney disease, HTN, HLD, GERD, DMII, depression, L hip avascular necrosis, anxiety, anemia, R THA 2011, L THA 2015, knee arthroscopy    Diagnostic tests per patient- R hip xray was clear    Patient Stated Goals decrease pain    Currently in Pain? No/denies  [facility] Adult PT Treatment/Exercise - 09/09/20 0001      Neuro Re-ed    Neuro Re-ed Details  romberg on foam 2x30", multi directional perturbations on foam with practice using hip strategy; backwards walking along counter EO/EC 4x length of counter   tendency to lose balance posteirorly     Knee/Hip Exercises: Stretches   Hip Flexor Stretch Right;30 seconds;1 rep;Left   4" step under R foot d/t poor tolerance   Hip Flexor Stretch Limitations mod thomas    Other Knee/Hip Stretches B butterfly stretch in supine 2x30"      Knee/Hip Exercises: Aerobic   Nustep L4 x 6 min (UEs/LEs)      Knee/Hip Exercises: Seated   Sit to Sand 5 reps;without UE support;1 set   red TB     Knee/Hip Exercises: Supine   Bridges with Clamshell Strengthening;Both;1 set;10 reps   red TB; limited ROM                 PT Education - 09/09/20 1658    Education Details update to HEP- to be  performed at counter top for safety; encouraged patient to continue using SPC in the home consistently; edu on proper technique with supine>sit for ease of transfer    Person(s) Educated Patient    Methods Explanation;Demonstration;Tactile cues;Verbal cues;Handout    Comprehension Verbalized understanding;Returned demonstration            PT Short Term Goals - 08/27/20 1700      PT SHORT TERM GOAL #1   Title Patient to be independent with initial HEP.    Status Achieved   07/30/20   Target Date 07/20/20             PT Long Term Goals - 08/27/20 1700      PT LONG TERM GOAL #1   Title Patient to be independent with advanced HEP.    Time 6    Period Weeks    Status Partially Met   met for current   Target Date 10/08/20      PT LONG TERM GOAL #2   Title Patient to demonstrate R hip AROM WFL and without pain limiting.    Time 6    Period Weeks    Status Partially Met   improved in R hip ER; pain still persists   Target Date 10/08/20      PT LONG TERM GOAL #3   Title Patient to demonstrate R LE strength >/=4+/5.    Time 6    Period Weeks    Status Partially Met   considerably improved; mild remaining weakness remains   Target Date 10/08/20      PT LONG TERM GOAL #4   Title Patient to report tolerance of 1 hour on her feet without pain limiting.    Time 6    Period Weeks    Status Partially Met   reporting tolerance for 5 minutes befor eonset of pain; 30 minutes before having to sit   Target Date 10/08/20      PT LONG TERM GOAL #5   Title Patient to score 48/56 on Berg in order to decrease risk of falls.    Time 6    Period Weeks    Status Partially Met   5/26/22[name] = 45/56   Target Date 10/08/20                 Plan - 09/09/20 1704    Clinical Impression Statement Patient arrived to session  without complaints and denied recent falls. Worked on bridges with patient demonstrating limited ROM and inconsistent hip abductor recruitment. STS transfers were  performed with added hip strengthening challenge and cues were provided to encourage proper set up and anterior trunk lean, with patient demonstrating great effort and carryover. Remainder of session was spent working on static and dynamic balance on firm and compliant surfaces. Patient with tendency for LOB posteriorly, with tendency to use reaching rather than hip strategy. Patient with difficulty using hip strategy when prompted, but was able to safely perform backwards walking with EO and EC. Patient reported understanding of HEP update and without complaints at end of session. Patient is progressing well towards goals. Will continue to benefit from skilled PT.    Personal Factors and Comorbidities Age;Comorbidity 3+;Fitness;Past/Current Experience;Time since onset of injury/illness/exacerbation    Comorbidities TIA, thyroid disease, Parkinsonism, kidney disease, HTN, HLD, GERD, DMII, depression, L hip avascular necrosis, anxiety, anemia, R THA 2011, L THA 2015, knee arthroscopy    PT Frequency 2x / week    PT Duration 6 weeks    PT Treatment/Interventions ADLs/Self Care Home Management;Cryotherapy;Electrical Stimulation;Iontophoresis 71m/ml Dexamethasone;Moist Heat;Balance training;Therapeutic exercise;Therapeutic activities;Functional mobility training;Stair training;Gait training;Ultrasound;Neuromuscular re-education;Patient/family education;Manual techniques;Taping;Energy conservation;Dry needling;Passive range of motion;Scar mobilization    PT Next Visit Plan progress R hip AROM and R LE strengthening to tolerance; STS transfers, balance training with narrow BOS & rotation; review seated & standing PWR! moves & progress to other positions as indicated    Consulted and Agree with Plan of Care Patient           Patient will benefit from skilled therapeutic intervention in order to improve the following deficits and impairments:  Abnormal gait,Decreased activity tolerance,Decreased  strength,Increased fascial restricitons,Pain,Decreased balance,Difficulty walking,Improper body mechanics,Decreased range of motion,Decreased safety awareness,Postural dysfunction,Impaired flexibility  Visit Diagnosis: Pain in right hip  Stiffness of right hip, not elsewhere classified  Muscle weakness (generalized)  Difficulty in walking, not elsewhere classified  Unsteadiness on feet     Problem List Patient Active Problem List   Diagnosis Date Noted  . Allergic conjunctivitis of both eyes 07/06/2020  . Gait abnormality 11/18/2019  . OSA on CPAP 06/04/2019  . Pain in joint of left shoulder 03/28/2019  . Seasonal allergic conjunctivitis 02/07/2019  . Chronic intermittent hypoxia with obstructive sleep apnea 01/25/2019  . Severe obstructive sleep apnea 01/25/2019  . Excessive daytime sleepiness 01/03/2019  . Non-restorative sleep 01/03/2019  . Nocturia more than twice per night 01/03/2019  . Weight gain, abnormal 01/03/2019  . Snorings 01/03/2019  . Trochanteric bursitis of right hip 04/27/2018  . Encounter for routine screening for malformation using ultrasonics 02/08/2018  . Abnormal weight gain 02/08/2018  . Acid indigestion 02/08/2018  . Acute antritis 02/08/2018  . Class 2 severe obesity with serious comorbidity and body mass index (BMI) of 39.0 to 39.9 in adult ([place] 02/08/2018  . Perennial allergic rhinitis 02/08/2018  . Antiphospholipid syndrome ([place] 02/08/2018  . Arthralgia of hip or thigh 02/08/2018  . Asthma, cough variant 02/08/2018  . Vitamin D deficiency 02/08/2018  . Backhand tennis elbow 02/08/2018  . Cannot sleep 02/08/2018  . Chronic kidney disease (CKD), stage III (moderate) ([place] 02/08/2018  . Current drug use 02/08/2018  . Depression, major, single episode, in partial remission ([place] 02/08/2018  . Diabetes mellitus with polyneuropathy ([place] 02/08/2018  . Difficulty hearing 02/08/2018  . Disturbance of skin sensation 02/08/2018  . Hypertension  associated with type 2 diabetes mellitus ([place] 02/08/2018  . Extremity pain 02/08/2018  .  Facet syndrome, lumbar 02/08/2018  . Factor V Leiden ([place]) 02/08/2018  . Fungal infection of nail 02/08/2018  . Gastroenteritis and colitis, viral 02/08/2018  . Hypoglycemia 02/08/2018  . Menopause 02/08/2018  . Other long term (current) drug therapy 02/08/2018  . Other osteonecrosis, unspecified femur ([place]) 02/08/2018  . Pure hypercholesterolemia 02/08/2018  . Recurrent major depressive episodes ([place]) 02/08/2018  . Syncope and collapse 02/08/2018  . Moderate persistent asthma 02/08/2018  . Cough, persistent 02/08/2018  . Restless leg syndrome 01/15/2018  . Degeneration of lumbar intervertebral disc 11/21/2017  . Tremor of right hand 03/02/2017  . Parkinson disease ([place]) 03/02/2017  . UTI (urinary tract infection) 12/01/2015  . Type 2 diabetes mellitus with stage 3b chronic kidney disease, with long-term current use of insulin ([place]) 06/03/2015  . Fast heart beat 04/09/2015  . 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 2 diabetes mellitus ([place]) 04/24/2014  . Postoperative anemia due to acute blood loss 10/10/2013  . Avascular necrosis of bone of left hip ([place]) 10/09/2013  . Arthritis of pelvic region, degenerative 10/09/2013  . Breath shortness 06/18/2013  . Diabetes mellitus type 2, uncontrolled ([place]) 05/31/2013  . Intermittent claudication ([place]) 04/04/2013  . Anxiety   . Gastroesophageal reflux disease 09/12/2012  . Clinical depression 02/21/2012  . Stroke ([place]) 02/11/2012  . History of revision of total replacement of right hip joint 04/04/2009    Deanna Rowe, PT, DPT 09/09/20 5:10 PM   [facility] [address]  [place], [place], [address] Phone: [phone]   Fax:  [phone]  Name: Deanna Rowe MRN: [id number] Date of Birth: 1956/02/26

## 2020-09-10 ENCOUNTER — Other Ambulatory Visit (HOSPITAL_BASED_OUTPATIENT_CLINIC_OR_DEPARTMENT_OTHER): Payer: Self-pay

## 2020-09-10 ENCOUNTER — Other Ambulatory Visit (HOSPITAL_COMMUNITY): Payer: Self-pay

## 2020-09-10 MED ORDER — GABAPENTIN 300 MG PO CAPS
1.0000 | ORAL_CAPSULE | Freq: Two times a day (BID) | ORAL | 0 refills | Status: DC
  Filled 2020-09-10: qty 180, 90d supply, fill #0

## 2020-09-10 MED FILL — Lorazepam Tab 2 MG: ORAL | 90 days supply | Qty: 270 | Fill #0 | Status: AC

## 2020-09-10 MED FILL — Propranolol HCl Tab 40 MG: ORAL | 90 days supply | Qty: 180 | Fill #0 | Status: AC

## 2020-09-11 ENCOUNTER — Other Ambulatory Visit (HOSPITAL_BASED_OUTPATIENT_CLINIC_OR_DEPARTMENT_OTHER): Payer: Self-pay

## 2020-09-22 ENCOUNTER — Ambulatory Visit: Payer: 59

## 2020-09-22 ENCOUNTER — Other Ambulatory Visit: Payer: Self-pay

## 2020-09-22 DIAGNOSIS — M25551 Pain in right hip: Secondary | ICD-10-CM

## 2020-09-22 DIAGNOSIS — R2681 Unsteadiness on feet: Secondary | ICD-10-CM | POA: Diagnosis not present

## 2020-09-22 DIAGNOSIS — M25651 Stiffness of right hip, not elsewhere classified: Secondary | ICD-10-CM | POA: Diagnosis not present

## 2020-09-22 DIAGNOSIS — M6281 Muscle weakness (generalized): Secondary | ICD-10-CM

## 2020-09-22 DIAGNOSIS — R262 Difficulty in walking, not elsewhere classified: Secondary | ICD-10-CM | POA: Diagnosis not present

## 2020-09-22 NOTE — Therapy (Signed)
[facility] [address]  [place], [place], [address] Phone: [phone]   Fax:  [phone]  Physical Therapy Treatment  Patient Details  Name: Deanna Rowe MRN: [id number] Date of Birth: 1955-06-03 Referring Provider (PT): Rod Can, MD   Encounter Date: 09/22/2020   PT End of Session - 09/22/20 1658     Visit Number 15    Number of Visits 24    Date for PT Re-Evaluation 10/08/20    Authorization Type [facility]    PT Start Time 1615    PT Stop Time 1654    PT Time Calculation (min) 39 min    Activity Tolerance Patient tolerated treatment well    Behavior During Therapy [facility] for tasks assessed/performed             Past Medical History:  Diagnosis Date   Anemia    Anxiety    Arthritis    L HIP   Asthma    Avascular necrosis of hip ([place])    LEFT   Back pain    Chest pain    Constipation    Depression    Diabetes mellitus    Diabetes mellitus, type II ([place])    Dry cough    Edema, lower extremity    Gait abnormality 11/18/2019   GERD (gastroesophageal reflux disease)    High cholesterol    Hypertension    Insomnia    takes Ambien nightly   Joint pain    Kidney disease    Kidney disease    Obesity    Parkinsonism ([place]) 03/02/2017   PONV (postoperative nausea and vomiting)    RLS (restless legs syndrome) 01/15/2018   Sleep apnea    Swallowing difficulty    Thyroid disease    TIA (transient ischemic attack) 2012   no residual problems   Urgency incontinence    Vitamin D deficiency     Past Surgical History:  Procedure Laterality Date   DILATION AND CURETTAGE OF UTERUS     ESOPHAGEAL MANOMETRY N/A 09/03/2012   Procedure: ESOPHAGEAL MANOMETRY (EM);  Surgeon: Garlan Fair, MD;  [facility];  Service: Endoscopy;  Laterality: N/A;   ESOPHAGEAL MANOMETRY N/A 06/19/2017   Procedure: ESOPHAGEAL MANOMETRY (EM);  Surgeon: Clarene Essex, MD;  [facility];  Service:  Endoscopy;  Laterality: N/A;   EXPLORATORY LAPAROTOMY     GANGLION CYST EXCISION     JOINT REPLACEMENT  2011   rt total hip   KNEE ARTHROSCOPY     PILONIDAL CYST / SINUS EXCISION     TOTAL HIP ARTHROPLASTY Left 10/09/2013   Procedure: LEFT TOTAL HIP ARTHROPLASTY ANTERIOR APPROACH;  Surgeon: [name], MD;  [facility];  Service: Orthopedics;  Laterality: Left;    There were no vitals filed for this visit.   Subjective Assessment - 09/22/20 1619     Subjective Vacation was great, R hip is really hurting today. It could be from all the walking and playing in the pool on Sunday.    Pertinent History TIA, thyroid disease, Parkinsonism, kidney disease, HTN, HLD, GERD, DMII, depression, L hip avascular necrosis, anxiety, anemia, R THA 2011, L THA 2015, knee arthroscopy    Diagnostic tests per patient- R hip xray was clear    Patient Stated Goals decrease pain    Currently in Pain? Yes    Pain Score 8     Pain Location Hip    Pain Orientation Right;Lateral  Pain Descriptors / Indicators Sharp    Pain Type Acute pain                [facility] PT Assessment - 09/22/20 0001       AROM   Right Hip Flexion 102   pain   Right Hip External Rotation  26   pain   Right Hip Internal Rotation  38                           [facility] Adult PT Treatment/Exercise - 09/22/20 0001       Knee/Hip Exercises: Stretches   Hip Flexor Stretch Right;30 seconds    Hip Flexor Stretch Limitations mod thomas with strap    Other Knee/Hip Stretches B butterfly stretch in supine 30"      Knee/Hip Exercises: Aerobic   Recumbent Bike L2 x 6 min      Manual Therapy   Manual Therapy Soft tissue mobilization;Myofascial release    Soft tissue mobilization STM to R glute med/minimus and piriformis    Myofascial Release manual TPR to R piriformis and lateral glute                      PT Short Term Goals - 08/27/20 1700       PT SHORT TERM GOAL #1   Title Patient to  be independent with initial HEP.    Status Achieved   07/30/20   Target Date 07/20/20               PT Long Term Goals - 09/22/20 1627       PT LONG TERM GOAL #1   Title Patient to be independent with advanced HEP.    Time 6    Period Weeks    Status Partially Met   met for current     PT LONG TERM GOAL #2   Title Patient to demonstrate R hip AROM WFL and without pain limiting.    Time 6    Period Weeks    Status Partially Met   improved in R hip flexion and IR; pain still persists with ER and flexion     PT LONG TERM GOAL #3   Title Patient to demonstrate R LE strength >/=4+/5.    Time 6    Period Weeks    Status Partially Met   considerably improved; mild remaining weakness remains     PT LONG TERM GOAL #4   Title Patient to report tolerance of 1 hour on her feet without pain limiting.    Time 6    Period Weeks    Status Partially Met   reporting tolerance for 5 minutes befor eonset of pain; 30 minutes before having to sit     PT LONG TERM GOAL #5   Title Patient to score 48/56 on Berg in order to decrease risk of falls.    Time 6    Period Weeks    Status Partially Met   5/26/22Merrilee Jansky = 45/56                  Plan - 09/22/20 1700     Clinical Impression Statement Pt has been out on vacation for a week. She came in today with increased R hip pain so focused more on soft tissue work and stretching today. She demonstrates increased muscle tension along the glute med/minimus and piriformis muscles. Post MT she reports feeling much  better. R hip AROM has improved with flexion and IR. Pain still persist with ER and flexion. She notes her standing tolerance remains the same. Overall she responded well to [facility], advised her to rest and use ice for the remainder of the day to allow pain to subside.    Personal Factors and Comorbidities Age;Comorbidity 3+;Fitness;Past/Current Experience;Time since onset of injury/illness/exacerbation    Comorbidities TIA, thyroid  disease, Parkinsonism, kidney disease, HTN, HLD, GERD, DMII, depression, L hip avascular necrosis, anxiety, anemia, R THA 2011, L THA 2015, knee arthroscopy    PT Frequency 2x / week    PT Duration 6 weeks    PT Treatment/Interventions ADLs/Self Care Home Management;Cryotherapy;Electrical Stimulation;Iontophoresis 83m/ml Dexamethasone;Moist Heat;Balance training;Therapeutic exercise;Therapeutic activities;Functional mobility training;Stair training;Gait training;Ultrasound;Neuromuscular re-education;Patient/family education;Manual techniques;Taping;Energy conservation;Dry needling;Passive range of motion;Scar mobilization    PT Next Visit Plan progress R hip AROM and R LE strengthening to tolerance; STS transfers, balance training with narrow BOS & rotation; review seated & standing PWR! moves & progress to other positions as indicated    Consulted and Agree with Plan of Care Patient             Patient will benefit from skilled therapeutic intervention in order to improve the following deficits and impairments:  Abnormal gait, Decreased activity tolerance, Decreased strength, Increased fascial restricitons, Pain, Decreased balance, Difficulty walking, Improper body mechanics, Decreased range of motion, Decreased safety awareness, Postural dysfunction, Impaired flexibility  Visit Diagnosis: Pain in right hip  Stiffness of right hip, not elsewhere classified  Muscle weakness (generalized)  Difficulty in walking, not elsewhere classified  Unsteadiness on feet     Problem List Patient Active Problem List   Diagnosis Date Noted   Allergic conjunctivitis of both eyes 07/06/2020   Gait abnormality 11/18/2019   OSA on CPAP 06/04/2019   Pain in joint of left shoulder 03/28/2019   Seasonal allergic conjunctivitis 02/07/2019   Chronic intermittent hypoxia with obstructive sleep apnea 01/25/2019   Severe obstructive sleep apnea 01/25/2019   Excessive daytime sleepiness 01/03/2019    Non-restorative sleep 01/03/2019   Nocturia more than twice per night 01/03/2019   Weight gain, abnormal 01/03/2019   Snorings 01/03/2019   Trochanteric bursitis of right hip 04/27/2018   Encounter for routine screening for malformation using ultrasonics 02/08/2018   Abnormal weight gain 02/08/2018   Acid indigestion 02/08/2018   Acute antritis 02/08/2018   Class 2 severe obesity with serious comorbidity and body mass index (BMI) of 39.0 to 39.9 in adult ([place] 02/08/2018   Perennial allergic rhinitis 02/08/2018   Antiphospholipid syndrome (HWar[place] 02/08/2018   Arthralgia of hip or thigh 02/08/2018   Asthma, cough variant 02/08/2018   Vitamin D deficiency 02/08/2018   Backhand tennis elbow 02/08/2018   Cannot sleep 02/08/2018   Chronic kidney disease (CKD), stage III (moderate) ([place] 02/08/2018   Current drug use 02/08/2018   Depression, major, single episode, in partial remission (HReading 02/08/2018   Diabetes mellitus with polyneuropathy (HPal[place] 02/08/2018   Difficulty hearing 02/08/2018   Disturbance of skin sensation 02/08/2018   Hypertension associated with type 2 diabetes mellitus (HSentinel 02/08/2018   Extremity pain 02/08/2018   Facet syndrome, lumbar 02/08/2018   Factor V Leiden (HFlournoy 02/08/2018   Fungal infection of nail 02/08/2018   Gastroenteritis and colitis, viral 02/08/2018   Hypoglycemia 02/08/2018   Menopause 02/08/2018   Other long term (current) drug therapy 02/08/2018   Other osteonecrosis, unspecified femur (HMun[place] 02/08/2018   Pure hypercholesterolemia 02/08/2018   Recurrent major depressive episodes (  [place]) 02/08/2018   Syncope and collapse 02/08/2018   Moderate persistent asthma 02/08/2018   Cough, persistent 02/08/2018   Restless leg syndrome 01/15/2018   Degeneration of lumbar intervertebral disc 11/21/2017   Tremor of right hand 03/02/2017   Parkinson disease ([place]) 03/02/2017   UTI (urinary tract infection) 12/01/2015   Type 2 diabetes mellitus with stage 3b  chronic kidney disease, with long-term current use of insulin ([place]) 06/03/2015   Fast heart beat 04/09/2015   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 2 diabetes mellitus ([place]) 04/24/2014   Postoperative anemia due to acute blood loss 10/10/2013   Avascular necrosis of bone of left hip ([place]) 10/09/2013   Arthritis of pelvic region, degenerative 10/09/2013   Breath shortness 06/18/2013   Diabetes mellitus type 2, uncontrolled ([place]) 05/31/2013   Intermittent claudication ([place]) 04/04/2013   Anxiety    Gastroesophageal reflux disease 09/12/2012   Clinical depression 02/21/2012   Stroke ([place]) 02/11/2012   History of revision of total replacement of right hip joint 04/04/2009    Artist Pais, PTA 09/22/2020, 6:08 PM  [facility] [address]  [place], [place], [address] Phone: [phone]   Fax:  [phone]  Name: Deanna Rowe MRN: [id number] Date of Birth: [birth year]

## 2020-09-24 ENCOUNTER — Encounter: Payer: Self-pay | Admitting: Physical Therapy

## 2020-09-24 ENCOUNTER — Other Ambulatory Visit: Payer: Self-pay

## 2020-09-24 ENCOUNTER — Ambulatory Visit: Payer: 59 | Admitting: Physical Therapy

## 2020-09-24 DIAGNOSIS — R2681 Unsteadiness on feet: Secondary | ICD-10-CM | POA: Diagnosis not present

## 2020-09-24 DIAGNOSIS — M6281 Muscle weakness (generalized): Secondary | ICD-10-CM

## 2020-09-24 DIAGNOSIS — R262 Difficulty in walking, not elsewhere classified: Secondary | ICD-10-CM | POA: Diagnosis not present

## 2020-09-24 DIAGNOSIS — M25551 Pain in right hip: Secondary | ICD-10-CM | POA: Diagnosis not present

## 2020-09-24 DIAGNOSIS — M25651 Stiffness of right hip, not elsewhere classified: Secondary | ICD-10-CM | POA: Diagnosis not present

## 2020-09-24 NOTE — Therapy (Signed)
[facility] [address]  [place], [place], [address] Phone: [phone]   Fax:  [phone]  Physical Therapy Treatment  Patient Details  Name: Deanna Rowe MRN: [id number] Date of Birth: 1955/07/14 Referring Provider (PT): Rod Can, MD   Encounter Date: 09/24/2020   PT End of Session - 09/24/20 1646     Visit Number 16    Number of Visits 24    Date for PT Re-Evaluation 10/08/20    Authorization Type [facility]    PT Start Time 1604    PT Stop Time 1647    PT Time Calculation (min) 43 min    Activity Tolerance Patient tolerated treatment well    Behavior During Therapy [facility] for tasks assessed/performed             Past Medical History:  Diagnosis Date   Anemia    Anxiety    Arthritis    L HIP   Asthma    Avascular necrosis of hip ([place])    LEFT   Back pain    Chest pain    Constipation    Depression    Diabetes mellitus    Diabetes mellitus, type II ([place])    Dry cough    Edema, lower extremity    Gait abnormality 11/18/2019   GERD (gastroesophageal reflux disease)    High cholesterol    Hypertension    Insomnia    takes Ambien nightly   Joint pain    Kidney disease    Kidney disease    Obesity    Parkinsonism ([place]) 03/02/2017   PONV (postoperative nausea and vomiting)    RLS (restless legs syndrome) 01/15/2018   Sleep apnea    Swallowing difficulty    Thyroid disease    TIA (transient ischemic attack) 2012   no residual problems   Urgency incontinence    Vitamin D deficiency     Past Surgical History:  Procedure Laterality Date   DILATION AND CURETTAGE OF UTERUS     ESOPHAGEAL MANOMETRY N/A 09/03/2012   Procedure: ESOPHAGEAL MANOMETRY (EM);  Surgeon: Garlan Fair, MD;  [facility];  Service: Endoscopy;  Laterality: N/A;   ESOPHAGEAL MANOMETRY N/A 06/19/2017   Procedure: ESOPHAGEAL MANOMETRY (EM);  Surgeon: Clarene Essex, MD;  [facility];  Service:  Endoscopy;  Laterality: N/A;   EXPLORATORY LAPAROTOMY     GANGLION CYST EXCISION     JOINT REPLACEMENT  2011   rt total hip   KNEE ARTHROSCOPY     PILONIDAL CYST / SINUS EXCISION     TOTAL HIP ARTHROPLASTY Left 10/09/2013   Procedure: LEFT TOTAL HIP ARTHROPLASTY ANTERIOR APPROACH;  Surgeon: [name], MD;  [facility];  Service: Orthopedics;  Laterality: Left;    There were no vitals filed for this visit.   Subjective Assessment - 09/24/20 1605     Subjective Couldn't get out of bed d/t pain Tuesday. Was scheduled to see Dr. Lyla Glassing but had to leave before she was seen. Only had 1 fall recently- it was once last week and she fell on a mat so she was fine.    Pertinent History TIA, thyroid disease, Parkinsonism, kidney disease, HTN, HLD, GERD, DMII, depression, L hip avascular necrosis, anxiety, anemia, R THA 2011, L THA 2015, knee arthroscopy    Diagnostic tests per patient- R hip xray was clear    Patient Stated Goals decrease pain    Currently in Pain? Yes  Pain Score 8     Pain Location Hip    Pain Orientation Right;Lateral    Pain Descriptors / Indicators Sharp    Pain Type Acute pain                               [facility] Adult PT Treatment/Exercise - 09/24/20 0001       Knee/Hip Exercises: Stretches   Hip Flexor Stretch Right;30 seconds;2 reps    Hip Flexor Stretch Limitations mod thomas with strap   cues to avoid pulling into pain   Other Knee/Hip Stretches R SKTC with strap 2x30"      Knee/Hip Exercises: Aerobic   Nustep L4 x 6 min (UEs/LEs)      Knee/Hip Exercises: Seated   Sit to Sand 5 reps;without UE support;2 sets   2nd set with blue medball at chest; review of proper set up for transfer     Knee/Hip Exercises: Supine   Bridges with [employer] Strengthening;Both;1 set;10 reps    Bridges with Clams[employer] Strengthening;Both;1 set;10 reps   red TB     Manual Therapy   Manual Therapy Soft tissue mobilization;Myofascial release     Manual therapy comments L sidelying    Soft tissue mobilization STM to R glute med/minimus and piriformis, proximal glutes    Myofascial Release manual TPR to R piriformis and lateral glute   very tight and tender   Passive ROM R Ober's stretch in sidelying 2x30"                    PT Education - 09/24/20 1616     Education Details discussion with patient's husband on current pain levels and plan moving forward    Person(s) Educated Spouse    Methods Explanation    Comprehension Verbalized understanding              PT Short Term Goals - 08/27/20 1700       PT SHORT TERM GOAL #1   Title Patient to be independent with initial HEP.    Status Achieved   07/30/20   Target Date 07/20/20               PT Long Term Goals - 09/22/20 1627       PT LONG TERM GOAL #1   Title Patient to be independent with advanced HEP.    Time 6    Period Weeks    Status Partially Met   met for current     PT LONG TERM GOAL #2   Title Patient to demonstrate R hip AROM WFL and without pain limiting.    Time 6    Period Weeks    Status Partially Met   improved in R hip flexion and IR; pain still persists with ER and flexion     PT LONG TERM GOAL #3   Title Patient to demonstrate R LE strength >/=4+/5.    Time 6    Period Weeks    Status Partially Met   considerably improved; mild remaining weakness remains     PT LONG TERM GOAL #4   Title Patient to report tolerance of 1 hour on her feet without pain limiting.    Time 6    Period Weeks    Status Partially Met   reporting tolerance for 5 minutes befor eonset of pain; 30 minutes before having to sit     PT LONG TERM GOAL #  5   Title Patient to score 48/56 on Berg in order to decrease risk of falls.    Time 6    Period Weeks    Status Partially Met   5/26/22[name] = 45/56                  Plan - 09/24/20 1647     Clinical Impression Statement Patient arrived to session with continued R hip pain. Spoke with  patient's husband who reported that referring MD advised patient to finish out her remaining PT visits. Patient does report 1 recent fall but denies injury. Patient performed glute and lateral hip strengthening within limited ROM and hip stretching within tolerable range. Cueing still required intermittently to avoid pushing into pain. After review of proper set up for STS transfers, patient was able to demonstrate good transfer mechanics. Also demonstrated good carryover of rolling before performing supine>sit today. Patient remarking on R hip pain with deep squatting as if to pick something off the ground, thus worked on [facility] stretching within tolerable range. Patient noted relief from [facility] last session, thus ended session today with STM and manual TPR to R proximal glutes and piriformis which were still tight and TTP. Patient noted improvement in pain at end of session. Patient continues to benefit from therapy despite fluctuating pain levels.    Personal Factors and Comorbidities Age;Comorbidity 3+;Fitness;Past/Current Experience;Time since onset of injury/illness/exacerbation    Comorbidities TIA, thyroid disease, Parkinsonism, kidney disease, HTN, HLD, GERD, DMII, depression, L hip avascular necrosis, anxiety, anemia, R THA 2011, L THA 2015, knee arthroscopy    PT Frequency 2x / week    PT Duration 6 weeks    PT Treatment/Interventions ADLs/Self Care Home Management;Cryotherapy;Electrical Stimulation;Iontophoresis 35m/ml Dexamethasone;Moist Heat;Balance training;Therapeutic exercise;Therapeutic activities;Functional mobility training;Stair training;Gait training;Ultrasound;Neuromuscular re-education;Patient/family education;Manual techniques;Taping;Energy conservation;Dry needling;Passive range of motion;Scar mobilization    PT Next Visit Plan progress R hip AROM and R LE strengthening to tolerance; STS transfers, balance training with narrow BOS & rotation; review seated & standing PWR! moves & progress  to other positions as indicated    Consulted and Agree with Plan of Care Patient             Patient will benefit from skilled therapeutic intervention in order to improve the following deficits and impairments:  Abnormal gait, Decreased activity tolerance, Decreased strength, Increased fascial restricitons, Pain, Decreased balance, Difficulty walking, Improper body mechanics, Decreased range of motion, Decreased safety awareness, Postural dysfunction, Impaired flexibility  Visit Diagnosis: Pain in right hip  Stiffness of right hip, not elsewhere classified  Muscle weakness (generalized)  Difficulty in walking, not elsewhere classified  Unsteadiness on feet     Problem List Patient Active Problem List   Diagnosis Date Noted   Allergic conjunctivitis of both eyes 07/06/2020   Gait abnormality 11/18/2019   OSA on CPAP 06/04/2019   Pain in joint of left shoulder 03/28/2019   Seasonal allergic conjunctivitis 02/07/2019   Chronic intermittent hypoxia with obstructive sleep apnea 01/25/2019   Severe obstructive sleep apnea 01/25/2019   Excessive daytime sleepiness 01/03/2019   Non-restorative sleep 01/03/2019   Nocturia more than twice per night 01/03/2019   Weight gain, abnormal 01/03/2019   Snorings 01/03/2019   Trochanteric bursitis of right hip 04/27/2018   Encounter for routine screening for malformation using ultrasonics 02/08/2018   Abnormal weight gain 02/08/2018   Acid indigestion 02/08/2018   Acute antritis 02/08/2018   Class 2 severe obesity with serious comorbidity and body mass index (BMI) of  39.0 to 39.9 in adult [facility]) 02/08/2018   Perennial allergic rhinitis 02/08/2018   Antiphospholipid syndrome ([place]) 02/08/2018   Arthralgia of hip or thigh 02/08/2018   Asthma, cough variant 02/08/2018   Vitamin D deficiency 02/08/2018   Backhand tennis elbow 02/08/2018   Cannot sleep 02/08/2018   Chronic kidney disease (CKD), stage III (moderate) (HCC) 02/08/2018    Current drug use 02/08/2018   Depression, major, single episode, in partial remission ([place]) 02/08/2018   Diabetes mellitus with polyneuropathy ([place]) 02/08/2018   Difficulty hearing 02/08/2018   Disturbance of skin sensation 02/08/2018   Hypertension associated with type 2 diabetes mellitus ([place]) 02/08/2018   Extremity pain 02/08/2018   Facet syndrome, lumbar 02/08/2018   Factor V Leiden ([place]) 02/08/2018   Fungal infection of nail 02/08/2018   Gastroenteritis and colitis, viral 02/08/2018   Hypoglycemia 02/08/2018   Menopause 02/08/2018   Other long term (current) drug therapy 02/08/2018   Other osteonecrosis, unspecified femur ([place]) 02/08/2018   Pure hypercholesterolemia 02/08/2018   Recurrent major depressive episodes ([place]) 02/08/2018   Syncope and collapse 02/08/2018   Moderate persistent asthma 02/08/2018   Cough, persistent 02/08/2018   Restless leg syndrome 01/15/2018   Degeneration of lumbar intervertebral disc 11/21/2017   Tremor of right hand 03/02/2017   Parkinson disease ([place]) 03/02/2017   UTI (urinary tract infection) 12/01/2015   Type 2 diabetes mellitus with stage 3b chronic kidney disease, with long-term current use of insulin ([place]) 06/03/2015   Fast heart beat 04/09/2015   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 2 diabetes mellitus ([place]) 04/24/2014   Postoperative anemia due to acute blood loss 10/10/2013   Avascular necrosis of bone of left hip ([place]) 10/09/2013   Arthritis of pelvic region, degenerative 10/09/2013   Breath shortness 06/18/2013   Diabetes mellitus type 2, uncontrolled ([place]) 05/31/2013   Intermittent claudication ([place]) 04/04/2013   Anxiety    Gastroesophageal reflux disease 09/12/2012   Clinical depression 02/21/2012   Stroke ([place]) 02/11/2012   History of revision of total replacement of right hip joint 04/04/2009     Janene Harvey, PT, DPT 09/24/20 4:50 PM   [facility] [address]  [place], [place], [address] Phone: [phone]   Fax:  [phone]  Name: Deanna Rowe MRN: [id number] Date of Birth: 06-15-1955

## 2020-09-29 ENCOUNTER — Ambulatory Visit: Payer: 59

## 2020-09-29 ENCOUNTER — Other Ambulatory Visit: Payer: Self-pay

## 2020-09-29 DIAGNOSIS — R2681 Unsteadiness on feet: Secondary | ICD-10-CM

## 2020-09-29 DIAGNOSIS — M25551 Pain in right hip: Secondary | ICD-10-CM

## 2020-09-29 DIAGNOSIS — R262 Difficulty in walking, not elsewhere classified: Secondary | ICD-10-CM

## 2020-09-29 DIAGNOSIS — M25651 Stiffness of right hip, not elsewhere classified: Secondary | ICD-10-CM

## 2020-09-29 DIAGNOSIS — M6281 Muscle weakness (generalized): Secondary | ICD-10-CM | POA: Diagnosis not present

## 2020-09-29 NOTE — Therapy (Signed)
[facility] [address]  [place], [place], [address] Phone: [phone]   Fax:  [phone]  Physical Therapy Treatment  Patient Details  Name: Deanna Rowe MRN: [id number] Date of Birth: 1956/01/07 Referring Provider (PT): Rod Can, MD   Encounter Date: 09/29/2020   PT End of Session - 09/29/20 1712     Visit Number 17    Number of Visits 24    Date for PT Re-Evaluation 10/08/20    Authorization Type [facility]    PT Start Time 1620    PT Stop Time 1701    PT Time Calculation (min) 41 min    Activity Tolerance Patient tolerated treatment well    Behavior During Therapy [facility] for tasks assessed/performed             Past Medical History:  Diagnosis Date   Anemia    Anxiety    Arthritis    L HIP   Asthma    Avascular necrosis of hip ([place])    LEFT   Back pain    Chest pain    Constipation    Depression    Diabetes mellitus    Diabetes mellitus, type II ([place])    Dry cough    Edema, lower extremity    Gait abnormality 11/18/2019   GERD (gastroesophageal reflux disease)    High cholesterol    Hypertension    Insomnia    takes Ambien nightly   Joint pain    Kidney disease    Kidney disease    Obesity    Parkinsonism ([place]) 03/02/2017   PONV (postoperative nausea and vomiting)    RLS (restless legs syndrome) 01/15/2018   Sleep apnea    Swallowing difficulty    Thyroid disease    TIA (transient ischemic attack) 2012   no residual problems   Urgency incontinence    Vitamin D deficiency     Past Surgical History:  Procedure Laterality Date   DILATION AND CURETTAGE OF UTERUS     ESOPHAGEAL MANOMETRY N/A 09/03/2012   Procedure: ESOPHAGEAL MANOMETRY (EM);  Surgeon: Garlan Fair, MD;  [facility];  Service: Endoscopy;  Laterality: N/A;   ESOPHAGEAL MANOMETRY N/A 06/19/2017   Procedure: ESOPHAGEAL MANOMETRY (EM);  Surgeon: Clarene Essex, MD;  [facility];  Service:  Endoscopy;  Laterality: N/A;   EXPLORATORY LAPAROTOMY     GANGLION CYST EXCISION     JOINT REPLACEMENT  2011   rt total hip   KNEE ARTHROSCOPY     PILONIDAL CYST / SINUS EXCISION     TOTAL HIP ARTHROPLASTY Left 10/09/2013   Procedure: LEFT TOTAL HIP ARTHROPLASTY ANTERIOR APPROACH;  Surgeon: [name], MD;  [facility];  Service: Orthopedics;  Laterality: Left;    There were no vitals filed for this visit.   Subjective Assessment - 09/29/20 1702     Subjective In a lot of pain today. "I have been in bed all day".    Pertinent History TIA, thyroid disease, Parkinsonism, kidney disease, HTN, HLD, GERD, DMII, depression, L hip avascular necrosis, anxiety, anemia, R THA 2011, L THA 2015, knee arthroscopy    Diagnostic tests per patient- R hip xray was clear    Patient Stated Goals decrease pain    Currently in Pain? Yes    Pain Score 10-Worst pain ever   decreased to 5/10 post MT   Pain Location Hip    Pain Orientation Right;Lateral;Anterior  Pain Descriptors / Indicators Sharp    Pain Type Acute pain                               [facility] Adult PT Treatment/Exercise - 09/29/20 0001       Knee/Hip Exercises: Stretches   Active Hamstring Stretch Right;30 seconds;2 reps    Active Hamstring Stretch Limitations passive stretching    Hip Flexor Stretch Right;2 reps;30 seconds    Hip Flexor Stretch Limitations mod thomas with strap    ITB Stretch Right;30 seconds    ITB Stretch Limitations passive    Piriformis Stretch Right;30 seconds;2 reps    Piriformis Stretch Limitations passive      Knee/Hip Exercises: Aerobic   Nustep L4 x 7 min (UEs/LEs)      Manual Therapy   Manual Therapy Soft tissue mobilization    Manual therapy comments L sidelying; supine    Soft tissue mobilization IASTM to R ITB, and quads                      PT Short Term Goals - 08/27/20 1700       PT SHORT TERM GOAL #1   Title Patient to be independent with  initial HEP.    Status Achieved   07/30/20   Target Date 07/20/20               PT Long Term Goals - 09/22/20 1627       PT LONG TERM GOAL #1   Title Patient to be independent with advanced HEP.    Time 6    Period Weeks    Status Partially Met   met for current     PT LONG TERM GOAL #2   Title Patient to demonstrate R hip AROM WFL and without pain limiting.    Time 6    Period Weeks    Status Partially Met   improved in R hip flexion and IR; pain still persists with ER and flexion     PT LONG TERM GOAL #3   Title Patient to demonstrate R LE strength >/=4+/5.    Time 6    Period Weeks    Status Partially Met   considerably improved; mild remaining weakness remains     PT LONG TERM GOAL #4   Title Patient to report tolerance of 1 hour on her feet without pain limiting.    Time 6    Period Weeks    Status Partially Met   reporting tolerance for 5 minutes befor eonset of pain; 30 minutes before having to sit     PT LONG TERM GOAL #5   Title Patient to score 48/56 on Berg in order to decrease risk of falls.    Time 6    Period Weeks    Status Partially Met   5/26/22Merrilee Jansky = 45/56                  Plan - 09/29/20 1728     Clinical Impression Statement Pt has been having continued high levels of R hip pain arriving to sessions. Consulted with her about reaching out to her MD about the high pain levels. She agreed to follow through. She was unable to perform Nustep past 3 min initially, d/t this we focused mostly on STM and stretching. Palpable tightness was found along the ITB and quads. She did report that her pain decreased  from 10/10 to 5/10 post MT. She was able to complete 6 full minutes on the Nustep post session. She refused any modalities after today's session but responds well to [facility] and stretching.    Personal Factors and Comorbidities Age;Comorbidity 3+;Fitness;Past/Current Experience;Time since onset of injury/illness/exacerbation    Comorbidities TIA,  thyroid disease, Parkinsonism, kidney disease, HTN, HLD, GERD, DMII, depression, L hip avascular necrosis, anxiety, anemia, R THA 2011, L THA 2015, knee arthroscopy    PT Frequency 2x / week    PT Duration 6 weeks    PT Treatment/Interventions ADLs/Self Care Home Management;Cryotherapy;Electrical Stimulation;Iontophoresis 29m/ml Dexamethasone;Moist Heat;Balance training;Therapeutic exercise;Therapeutic activities;Functional mobility training;Stair training;Gait training;Ultrasound;Neuromuscular re-education;Patient/family education;Manual techniques;Taping;Energy conservation;Dry needling;Passive range of motion;Scar mobilization    PT Next Visit Plan progress R hip AROM and R LE strengthening to tolerance; STS transfers, balance training with narrow BOS & rotation; review seated & standing PWR! moves & progress to other positions as indicated    Consulted and Agree with Plan of Care Patient             Patient will benefit from skilled therapeutic intervention in order to improve the following deficits and impairments:  Abnormal gait, Decreased activity tolerance, Decreased strength, Increased fascial restricitons, Pain, Decreased balance, Difficulty walking, Improper body mechanics, Decreased range of motion, Decreased safety awareness, Postural dysfunction, Impaired flexibility  Visit Diagnosis: Pain in right hip  Stiffness of right hip, not elsewhere classified  Muscle weakness (generalized)  Difficulty in walking, not elsewhere classified  Unsteadiness on feet     Problem List Patient Active Problem List   Diagnosis Date Noted   Allergic conjunctivitis of both eyes 07/06/2020   Gait abnormality 11/18/2019   OSA on CPAP 06/04/2019   Pain in joint of left shoulder 03/28/2019   Seasonal allergic conjunctivitis 02/07/2019   Chronic intermittent hypoxia with obstructive sleep apnea 01/25/2019   Severe obstructive sleep apnea 01/25/2019   Excessive daytime sleepiness 01/03/2019    Non-restorative sleep 01/03/2019   Nocturia more than twice per night 01/03/2019   Weight gain, abnormal 01/03/2019   Snorings 01/03/2019   Trochanteric bursitis of right hip 04/27/2018   Encounter for routine screening for malformation using ultrasonics 02/08/2018   Abnormal weight gain 02/08/2018   Acid indigestion 02/08/2018   Acute antritis 02/08/2018   Class 2 severe obesity with serious comorbidity and body mass index (BMI) of 39.0 to 39.9 in adult ([place] 02/08/2018   Perennial allergic rhinitis 02/08/2018   Antiphospholipid syndrome ([place] 02/08/2018   Arthralgia of hip or thigh 02/08/2018   Asthma, cough variant 02/08/2018   Vitamin D deficiency 02/08/2018   Backhand tennis elbow 02/08/2018   Cannot sleep 02/08/2018   Chronic kidney disease (CKD), stage III (moderate) ([place] 02/08/2018   Current drug use 02/08/2018   Depression, major, single episode, in partial remission ([place] 02/08/2018   Diabetes mellitus with polyneuropathy ([place] 02/08/2018   Difficulty hearing 02/08/2018   Disturbance of skin sensation 02/08/2018   Hypertension associated with type 2 diabetes mellitus ([place] 02/08/2018   Extremity pain 02/08/2018   Facet syndrome, lumbar 02/08/2018   Factor V Leiden ([place] 02/08/2018   Fungal infection of nail 02/08/2018   Gastroenteritis and colitis, viral 02/08/2018   Hypoglycemia 02/08/2018   Menopause 02/08/2018   Other long term (current) drug therapy 02/08/2018   Other osteonecrosis, unspecified femur ([place] 02/08/2018   Pure hypercholesterolemia 02/08/2018   Recurrent major depressive episodes ([place] 02/08/2018   Syncope and collapse 02/08/2018   Moderate persistent asthma 02/08/2018   Cough,  persistent 02/08/2018   Restless leg syndrome 01/15/2018   Degeneration of lumbar intervertebral disc 11/21/2017   Tremor of right hand 03/02/2017   Parkinson disease ([place]) 03/02/2017   UTI (urinary tract infection) 12/01/2015   Type 2 diabetes mellitus with stage 3b  chronic kidney disease, with long-term current use of insulin ([place]) 06/03/2015   Fast heart beat 04/09/2015   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 2 diabetes mellitus ([place]) 04/24/2014   Postoperative anemia due to acute blood loss 10/10/2013   Avascular necrosis of bone of left hip ([place]) 10/09/2013   Arthritis of pelvic region, degenerative 10/09/2013   Breath shortness 06/18/2013   Diabetes mellitus type 2, uncontrolled ([place]) 05/31/2013   Intermittent claudication ([place]) 04/04/2013   Anxiety    Gastroesophageal reflux disease 09/12/2012   Clinical depression 02/21/2012   Stroke ([place]) 02/11/2012   History of revision of total replacement of right hip joint 04/04/2009    Artist Pais, PTA 09/29/2020, 5:37 PM  [facility] [address]  [place], [place], [address] Phone: [phone]   Fax:  [phone]  Name: Deanna Rowe MRN: [id number] Date of Birth: Jul 02, 1955

## 2020-09-30 ENCOUNTER — Other Ambulatory Visit (HOSPITAL_BASED_OUTPATIENT_CLINIC_OR_DEPARTMENT_OTHER): Payer: Self-pay

## 2020-09-30 MED FILL — Valacyclovir HCl Tab 500 MG: ORAL | 90 days supply | Qty: 90 | Fill #1 | Status: AC

## 2020-09-30 MED FILL — Zolpidem Tartrate Tab 5 MG: ORAL | 90 days supply | Qty: 90 | Fill #1 | Status: AC

## 2020-10-01 ENCOUNTER — Ambulatory Visit: Payer: 59 | Admitting: Physical Therapy

## 2020-10-01 ENCOUNTER — Ambulatory Visit (INDEPENDENT_AMBULATORY_CARE_PROVIDER_SITE_OTHER): Payer: 59 | Admitting: Family Medicine

## 2020-10-01 ENCOUNTER — Other Ambulatory Visit (HOSPITAL_COMMUNITY): Payer: Self-pay

## 2020-10-01 ENCOUNTER — Encounter: Payer: Self-pay | Admitting: Physical Therapy

## 2020-10-01 ENCOUNTER — Other Ambulatory Visit: Payer: Self-pay

## 2020-10-01 ENCOUNTER — Encounter (INDEPENDENT_AMBULATORY_CARE_PROVIDER_SITE_OTHER): Payer: Self-pay | Admitting: Family Medicine

## 2020-10-01 ENCOUNTER — Other Ambulatory Visit (HOSPITAL_BASED_OUTPATIENT_CLINIC_OR_DEPARTMENT_OTHER): Payer: Self-pay

## 2020-10-01 VITALS — BP 94/55 | HR 74 | Temp 97.8°F | Ht 66.0 in | Wt 210.0 lb

## 2020-10-01 DIAGNOSIS — E1122 Type 2 diabetes mellitus with diabetic chronic kidney disease: Secondary | ICD-10-CM | POA: Diagnosis not present

## 2020-10-01 DIAGNOSIS — Z794 Long term (current) use of insulin: Secondary | ICD-10-CM

## 2020-10-01 DIAGNOSIS — E1159 Type 2 diabetes mellitus with other circulatory complications: Secondary | ICD-10-CM | POA: Diagnosis not present

## 2020-10-01 DIAGNOSIS — M6281 Muscle weakness (generalized): Secondary | ICD-10-CM | POA: Diagnosis not present

## 2020-10-01 DIAGNOSIS — M25651 Stiffness of right hip, not elsewhere classified: Secondary | ICD-10-CM | POA: Diagnosis not present

## 2020-10-01 DIAGNOSIS — Z6839 Body mass index (BMI) 39.0-39.9, adult: Secondary | ICD-10-CM

## 2020-10-01 DIAGNOSIS — N1832 Chronic kidney disease, stage 3b: Secondary | ICD-10-CM

## 2020-10-01 DIAGNOSIS — M25551 Pain in right hip: Secondary | ICD-10-CM | POA: Diagnosis not present

## 2020-10-01 DIAGNOSIS — R2681 Unsteadiness on feet: Secondary | ICD-10-CM

## 2020-10-01 DIAGNOSIS — I152 Hypertension secondary to endocrine disorders: Secondary | ICD-10-CM

## 2020-10-01 DIAGNOSIS — R262 Difficulty in walking, not elsewhere classified: Secondary | ICD-10-CM | POA: Diagnosis not present

## 2020-10-01 MED ORDER — MYRBETRIQ 50 MG PO TB24
ORAL_TABLET | ORAL | 6 refills | Status: DC
  Filled 2020-10-01: qty 30, 30d supply, fill #0

## 2020-10-01 MED ORDER — MYRBETRIQ 50 MG PO TB24
ORAL_TABLET | ORAL | 6 refills | Status: DC
  Filled 2021-01-01: qty 30, 30d supply, fill #0
  Filled 2021-02-04: qty 30, 30d supply, fill #1
  Filled 2021-03-08 (×2): qty 30, 30d supply, fill #2
  Filled 2021-04-05: qty 30, 30d supply, fill #3
  Filled 2021-05-07: qty 30, 30d supply, fill #4
  Filled 2021-05-31: qty 30, 30d supply, fill #5
  Filled 2021-07-05: qty 30, 30d supply, fill #6

## 2020-10-01 MED FILL — Atorvastatin Calcium Tab 10 MG (Base Equivalent): ORAL | 90 days supply | Qty: 90 | Fill #1 | Status: AC

## 2020-10-01 MED FILL — Solifenacin Succinate Tab 10 MG: ORAL | 30 days supply | Qty: 30 | Fill #3 | Status: AC

## 2020-10-01 NOTE — Therapy (Signed)
[facility] [address]  [place], [place], [address] Phone: [phone]   Fax:  [phone]  Physical Therapy Treatment  Patient Details  Name: Deanna Rowe MRN: [id number] Date of Birth: 06-Feb-1956 Referring Provider (PT): Rod Can, MD   Encounter Date: 10/01/2020   PT End of Session - 10/01/20 1700     Visit Number 18    Number of Visits 24    Date for PT Re-Evaluation 10/08/20    Authorization Type [facility]    PT Start Time 1616    PT Stop Time 1659    PT Time Calculation (min) 43 min    Activity Tolerance Patient tolerated treatment well    Behavior During Therapy [facility] for tasks assessed/performed             Past Medical History:  Diagnosis Date   Anemia    Anxiety    Arthritis    L HIP   Asthma    Avascular necrosis of hip ([place])    LEFT   Back pain    Chest pain    Constipation    Depression    Diabetes mellitus    Diabetes mellitus, type II ([place])    Dry cough    Edema, lower extremity    Gait abnormality 11/18/2019   GERD (gastroesophageal reflux disease)    High cholesterol    Hypertension    Insomnia    takes Ambien nightly   Joint pain    Kidney disease    Kidney disease    Obesity    Parkinsonism ([place]) 03/02/2017   PONV (postoperative nausea and vomiting)    RLS (restless legs syndrome) 01/15/2018   Sleep apnea    Swallowing difficulty    Thyroid disease    TIA (transient ischemic attack) 2012   no residual problems   Urgency incontinence    Vitamin D deficiency     Past Surgical History:  Procedure Laterality Date   DILATION AND CURETTAGE OF UTERUS     ESOPHAGEAL MANOMETRY N/A 09/03/2012   Procedure: ESOPHAGEAL MANOMETRY (EM);  Surgeon: Garlan Fair, MD;  [facility];  Service: Endoscopy;  Laterality: N/A;   ESOPHAGEAL MANOMETRY N/A 06/19/2017   Procedure: ESOPHAGEAL MANOMETRY (EM);  Surgeon: Clarene Essex, MD;  [facility];  Service:  Endoscopy;  Laterality: N/A;   EXPLORATORY LAPAROTOMY     GANGLION CYST EXCISION     JOINT REPLACEMENT  2011   rt total hip   KNEE ARTHROSCOPY     PILONIDAL CYST / SINUS EXCISION     TOTAL HIP ARTHROPLASTY Left 10/09/2013   Procedure: LEFT TOTAL HIP ARTHROPLASTY ANTERIOR APPROACH;  Surgeon: [name], MD;  [facility];  Service: Orthopedics;  Laterality: Left;    There were no vitals filed for this visit.   Subjective Assessment - 10/01/20 1618     Subjective Feeling a little better since last session. Pain is worse in the AM- feels that she can hardly walk. Reports compalince with HEP. Seeing the MD July 11.    Pertinent History TIA, thyroid disease, Parkinsonism, kidney disease, HTN, HLD, GERD, DMII, depression, L hip avascular necrosis, anxiety, anemia, R THA 2011, L THA 2015, knee arthroscopy    Diagnostic tests per patient- R hip xray was clear    Patient Stated Goals decrease pain    Currently in Pain? Yes    Pain Location Hip    Pain Orientation Right  Pain Descriptors / Indicators Sharp    Pain Type Acute pain                               [facility] Adult PT Treatment/Exercise - 10/01/20 0001       Knee/Hip Exercises: Aerobic   Recumbent Bike L2 x 6 min      Knee/Hip Exercises: Standing   Heel Raises Both;1 set;15 reps    Heel Raises Limitations at TM rail    Hip Abduction Stengthening;Both;10 reps;Knee straight    Abduction Limitations looped yellow TB at ankles; UE support on counter   lateral trunk lean   Hip Extension Stengthening;Right;Left;1 set;10 reps;Knee straight    Extension Limitations looped yellow TB at ankles; UE support on rail      Manual Therapy   Manual Therapy Soft tissue mobilization    Manual therapy comments L sidelying    Soft tissue mobilization STM to R proximal and lateral glutes, piriformis    Myofascial Release manual TPR to R piriformis and proximal glute   TTP but improved tightness                 Balance Exercises - 10/01/20 0001       Balance Exercises: Standing   Standing Eyes Opened Narrow base of support (BOS);30 secs;3 reps;Foam/compliant surface   tendency for L LOB   Step Ups Forward;Intermittent UE support   R/L step up/back on foam 5x each   Other Standing Exercises alt toe tap on treadmill 3x30"   no UE support; cueing to maintain toes forward and widening BOS   Other Standing Exercises Comments high knee march on foam 2x20   2/1 UE supprt on hand rail; cueing for higher amplitude movements                PT Short Term Goals - 08/27/20 1700       PT SHORT TERM GOAL #1   Title Patient to be independent with initial HEP.    Status Achieved   07/30/20   Target Date 07/20/20               PT Long Term Goals - 09/22/20 1627       PT LONG TERM GOAL #1   Title Patient to be independent with advanced HEP.    Time 6    Period Weeks    Status Partially Met   met for current     PT LONG TERM GOAL #2   Title Patient to demonstrate R hip AROM WFL and without pain limiting.    Time 6    Period Weeks    Status Partially Met   improved in R hip flexion and IR; pain still persists with ER and flexion     PT LONG TERM GOAL #3   Title Patient to demonstrate R LE strength >/=4+/5.    Time 6    Period Weeks    Status Partially Met   considerably improved; mild remaining weakness remains     PT LONG TERM GOAL #4   Title Patient to report tolerance of 1 hour on her feet without pain limiting.    Time 6    Period Weeks    Status Partially Met   reporting tolerance for 5 minutes befor eonset of pain; 30 minutes before having to sit     PT LONG TERM GOAL #5   Title Patient to score 48/56 on  Berg in order to decrease risk of falls.    Time 6    Period Weeks    Status Partially Met   5/26/22Merrilee Jansky = 45/56                  Plan - 10/01/20 1701     Clinical Impression Statement Patient arrived to session with report of feeling better  since last session. Notes pain is still worst in the AM. Has a F/U with referring MD on July 11th. Worked on static and dynamic balance on foam and firm surfaces today. In static balance, patient demonstrated tendency for L LOB. Able to correct after cueing. Dynamic stepping exercises were performed with correction of foot position d/t tendency for narrow BOS and excessive toe out, causing LOB. Remainder of session was spent on standing LE strengthening ther-ex with patient demonstrating good tolerance but needing cueing to avoid anterior and lateral trunk lean. Ended session with MT to continue addressing tightness in buttock. Patient without complaints at end of session.    Personal Factors and Comorbidities Age;Comorbidity 3+;Fitness;Past/Current Experience;Time since onset of injury/illness/exacerbation    Comorbidities TIA, thyroid disease, Parkinsonism, kidney disease, HTN, HLD, GERD, DMII, depression, L hip avascular necrosis, anxiety, anemia, R THA 2011, L THA 2015, knee arthroscopy    PT Frequency 2x / week    PT Duration 6 weeks    PT Treatment/Interventions ADLs/Self Care Home Management;Cryotherapy;Electrical Stimulation;Iontophoresis 37m/ml Dexamethasone;Moist Heat;Balance training;Therapeutic exercise;Therapeutic activities;Functional mobility training;Stair training;Gait training;Ultrasound;Neuromuscular re-education;Patient/family education;Manual techniques;Taping;Energy conservation;Dry needling;Passive range of motion;Scar mobilization    PT Next Visit Plan progress R hip AROM and R LE strengthening to tolerance; STS transfers, balance training with narrow BOS & rotation; review seated & standing PWR! moves & progress to other positions as indicated    Consulted and Agree with Plan of Care Patient             Patient will benefit from skilled therapeutic intervention in order to improve the following deficits and impairments:  Abnormal gait, Decreased activity tolerance, Decreased  strength, Increased fascial restricitons, Pain, Decreased balance, Difficulty walking, Improper body mechanics, Decreased range of motion, Decreased safety awareness, Postural dysfunction, Impaired flexibility  Visit Diagnosis: Pain in right hip  Stiffness of right hip, not elsewhere classified  Muscle weakness (generalized)  Difficulty in walking, not elsewhere classified  Unsteadiness on feet     Problem List Patient Active Problem List   Diagnosis Date Noted   Allergic conjunctivitis of both eyes 07/06/2020   Gait abnormality 11/18/2019   OSA on CPAP 06/04/2019   Pain in joint of left shoulder 03/28/2019   Seasonal allergic conjunctivitis 02/07/2019   Chronic intermittent hypoxia with obstructive sleep apnea 01/25/2019   Severe obstructive sleep apnea 01/25/2019   Excessive daytime sleepiness 01/03/2019   Non-restorative sleep 01/03/2019   Nocturia more than twice per night 01/03/2019   Weight gain, abnormal 01/03/2019   Snorings 01/03/2019   Trochanteric bursitis of right hip 04/27/2018   Encounter for routine screening for malformation using ultrasonics 02/08/2018   Abnormal weight gain 02/08/2018   Acid indigestion 02/08/2018   Acute antritis 02/08/2018   Class 2 severe obesity with serious comorbidity and body mass index (BMI) of 39.0 to 39.9 in adult ([facility] 02/08/2018   Perennial allergic rhinitis 02/08/2018   Antiphospholipid syndrome ([place] 02/08/2018   Arthralgia of hip or thigh 02/08/2018   Asthma, cough variant 02/08/2018   Vitamin D deficiency 02/08/2018   Backhand tennis elbow 02/08/2018   Cannot sleep 02/08/2018  Chronic kidney disease (CKD), stage III (moderate) (HCC) 02/08/2018   Current drug use 02/08/2018   Depression, major, single episode, in partial remission ([place]) 02/08/2018   Diabetes mellitus with polyneuropathy ([place]) 02/08/2018   Difficulty hearing 02/08/2018   Disturbance of skin sensation 02/08/2018   Hypertension associated with type 2  diabetes mellitus ([place]) 02/08/2018   Extremity pain 02/08/2018   Facet syndrome, lumbar 02/08/2018   Factor V Leiden ([place]) 02/08/2018   Fungal infection of nail 02/08/2018   Gastroenteritis and colitis, viral 02/08/2018   Hypoglycemia 02/08/2018   Menopause 02/08/2018   Other long term (current) drug therapy 02/08/2018   Other osteonecrosis, unspecified femur ([place]) 02/08/2018   Pure hypercholesterolemia 02/08/2018   Recurrent major depressive episodes ([place]) 02/08/2018   Syncope and collapse 02/08/2018   Moderate persistent asthma 02/08/2018   Cough, persistent 02/08/2018   Restless leg syndrome 01/15/2018   Degeneration of lumbar intervertebral disc 11/21/2017   Tremor of right hand 03/02/2017   Parkinson disease ([place]) 03/02/2017   UTI (urinary tract infection) 12/01/2015   Type 2 diabetes mellitus with stage 3b chronic kidney disease, with long-term current use of insulin ([place]) 06/03/2015   Fast heart beat 04/09/2015   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 2 diabetes mellitus ([place]) 04/24/2014   Postoperative anemia due to acute blood loss 10/10/2013   Avascular necrosis of bone of left hip ([place]) 10/09/2013   Arthritis of pelvic region, degenerative 10/09/2013   Breath shortness 06/18/2013   Diabetes mellitus type 2, uncontrolled ([place]) 05/31/2013   Intermittent claudication ([place]) 04/04/2013   Anxiety    Gastroesophageal reflux disease 09/12/2012   Clinical depression 02/21/2012   Stroke ([place]) 02/11/2012   History of revision of total replacement of right hip joint 04/04/2009    Janene Harvey, PT, DPT 10/01/20 [facility] [address]  [place], [place], [address] Phone: [phone]   Fax:  [phone]  Name: Deanna Rowe MRN: [id number] Date of Birth: 06-Mar-1956

## 2020-10-06 ENCOUNTER — Ambulatory Visit: Payer: 59 | Attending: Orthopedic Surgery | Admitting: Physical Therapy

## 2020-10-06 ENCOUNTER — Encounter: Payer: Self-pay | Admitting: Physical Therapy

## 2020-10-06 ENCOUNTER — Other Ambulatory Visit: Payer: Self-pay

## 2020-10-06 VITALS — BP 105/71 | HR 62

## 2020-10-06 DIAGNOSIS — M25551 Pain in right hip: Secondary | ICD-10-CM | POA: Diagnosis not present

## 2020-10-06 DIAGNOSIS — M25651 Stiffness of right hip, not elsewhere classified: Secondary | ICD-10-CM | POA: Insufficient documentation

## 2020-10-06 DIAGNOSIS — M6281 Muscle weakness (generalized): Secondary | ICD-10-CM | POA: Insufficient documentation

## 2020-10-06 DIAGNOSIS — R262 Difficulty in walking, not elsewhere classified: Secondary | ICD-10-CM | POA: Insufficient documentation

## 2020-10-06 DIAGNOSIS — R2681 Unsteadiness on feet: Secondary | ICD-10-CM | POA: Diagnosis not present

## 2020-10-06 NOTE — Therapy (Signed)
[facility] [address]  [place], [place], [address] Phone: [phone]   Fax:  [phone]  Physical Therapy Treatment  Patient Details  Name: Deanna Rowe MRN: [id number] Date of Birth: 1955/11/27 Referring Provider (PT): Rod Can, MD   Encounter Date: 10/06/2020   PT End of Session - 10/06/20 1642     Visit Number 19    Number of Visits 24    Date for PT Re-Evaluation 10/08/20    Authorization Type [facility]    PT Start Time 1616    PT Stop Time 1638    PT Time Calculation (min) 22 min    Activity Tolerance Other (comment)   limited by dizziness   Behavior During Therapy --   tired/drowsy            Past Medical History:  Diagnosis Date   Anemia    Anxiety    Arthritis    L HIP   Asthma    Avascular necrosis of hip ([place])    LEFT   Back pain    Chest pain    Constipation    Depression    Diabetes mellitus    Diabetes mellitus, type II ([facility])    Dry cough    Edema, lower extremity    Gait abnormality 11/18/2019   GERD (gastroesophageal reflux disease)    High cholesterol    Hypertension    Insomnia    takes Ambien nightly   Joint pain    Kidney disease    Kidney disease    Obesity    Parkinsonism ([place]) 03/02/2017   PONV (postoperative nausea and vomiting)    RLS (restless legs syndrome) 01/15/2018   Sleep apnea    Swallowing difficulty    Thyroid disease    TIA (transient ischemic attack) 2012   no residual problems   Urgency incontinence    Vitamin D deficiency     Past Surgical History:  Procedure Laterality Date   DILATION AND CURETTAGE OF UTERUS     ESOPHAGEAL MANOMETRY N/A 09/03/2012   Procedure: ESOPHAGEAL MANOMETRY (EM);  Surgeon: Garlan Fair, MD;  [facility];  Service: Endoscopy;  Laterality: N/A;   ESOPHAGEAL MANOMETRY N/A 06/19/2017   Procedure: ESOPHAGEAL MANOMETRY (EM);  Surgeon: Clarene Essex, MD;  [facility];  Service: Endoscopy;   Laterality: N/A;   EXPLORATORY LAPAROTOMY     GANGLION CYST EXCISION     JOINT REPLACEMENT  2011   rt total hip   KNEE ARTHROSCOPY     PILONIDAL CYST / SINUS EXCISION     TOTAL HIP ARTHROPLASTY Left 10/09/2013   Procedure: LEFT TOTAL HIP ARTHROPLASTY ANTERIOR APPROACH;  Surgeon: [name], MD;  [facility];  Service: Orthopedics;  Laterality: Left;    Vitals:   10/06/20 1621 10/06/20 1631  BP: 108/72 105/71  Pulse: 62 62  SpO2: 95% 93%     Subjective Assessment - 10/06/20 1622     Subjective Not feeling well. Started feeling dizzy spontaneously while sitting on the couch at around noon today. "Everything seems a little foggy. I am scared that I'm gonna fall."  DId not let her husband know because she thought it would go away. Denies N/T, weakness, trouble speaking/swallowing, chest pain, SOB, recent infection. Denies changes in meds.    Pertinent History TIA, thyroid disease, Parkinsonism, kidney disease, HTN, HLD, GERD, DMII, depression, L hip avascular necrosis, anxiety, anemia, R THA 2011, L THA 2015, knee  arthroscopy    Diagnostic tests per patient- R hip xray was clear    Patient Stated Goals decrease pain    Currently in Pain? Yes    Pain Score 5     Pain Location Hip    Pain Orientation Right    Pain Descriptors / Indicators Sharp    Pain Type Acute pain                     Vestibular Assessment - 10/06/20 0001       Other Tests   Comments HINTS exam- negative for saccades in R/L gaze, negative R/L HIT, negative R/L test of skew                              PT Education - 10/06/20 1641     Education Details discussion with patient and husband about her current symptoms; advised to F/U with PCP if symptoms continue; encouraged use of walker rather than SPC d/t report of increased unsteadiness    Person(s) Educated Patient;Spouse    Methods Explanation    Comprehension Verbalized understanding              PT Short  Term Goals - 08/27/20 1700       PT SHORT TERM GOAL #1   Title Patient to be independent with initial HEP.    Status Achieved   07/30/20   Target Date 07/20/20               PT Long Term Goals - 09/22/20 1627       PT LONG TERM GOAL #1   Title Patient to be independent with advanced HEP.    Time 6    Period Weeks    Status Partially Met   met for current     PT LONG TERM GOAL #2   Title Patient to demonstrate R hip AROM WFL and without pain limiting.    Time 6    Period Weeks    Status Partially Met   improved in R hip flexion and IR; pain still persists with ER and flexion     PT LONG TERM GOAL #3   Title Patient to demonstrate R LE strength >/=4+/5.    Time 6    Period Weeks    Status Partially Met   considerably improved; mild remaining weakness remains     PT LONG TERM GOAL #4   Title Patient to report tolerance of 1 hour on her feet without pain limiting.    Time 6    Period Weeks    Status Partially Met   reporting tolerance for 5 minutes befor eonset of pain; 30 minutes before having to sit     PT LONG TERM GOAL #5   Title Patient to score 48/56 on Berg in order to decrease risk of falls.    Time 6    Period Weeks    Status Partially Met   5/26/22Merrilee Jansky = 45/56                  Plan - 10/06/20 1644     Clinical Impression Statement Patient arrived to session with report of dizziness starting spontaneously this afternoon when she was sitting on the couch. Denies N/T, weakness, trouble speaking/swallowing, chest pain, SOB, changes in meds, recent infection. Vitals were maintained WNL throughout session. HINTs exam was negative and did not indicate peripheral or central causes of  vertigo. Advised patient and husband to seek medical attention if symptoms continue as patient does not currently have any red flag symptoms. Husband reported understanding. Patient left session early with husband as she could not tolerate session d/t dizziness today.     Personal Factors and Comorbidities Age;Comorbidity 3+;Fitness;Past/Current Experience;Time since onset of injury/illness/exacerbation    Comorbidities TIA, thyroid disease, Parkinsonism, kidney disease, HTN, HLD, GERD, DMII, depression, L hip avascular necrosis, anxiety, anemia, R THA 2011, L THA 2015, knee arthroscopy    PT Frequency 2x / week    PT Duration 6 weeks    PT Treatment/Interventions ADLs/Self Care Home Management;Cryotherapy;Electrical Stimulation;Iontophoresis 65m/ml Dexamethasone;Moist Heat;Balance training;Therapeutic exercise;Therapeutic activities;Functional mobility training;Stair training;Gait training;Ultrasound;Neuromuscular re-education;Patient/family education;Manual techniques;Taping;Energy conservation;Dry needling;Passive range of motion;Scar mobilization    PT Next Visit Plan progress R hip AROM and R LE strengthening to tolerance; STS transfers, balance training with narrow BOS & rotation; review seated & standing PWR! moves & progress to other positions as indicated    Consulted and Agree with Plan of Care Patient             Patient will benefit from skilled therapeutic intervention in order to improve the following deficits and impairments:  Abnormal gait, Decreased activity tolerance, Decreased strength, Increased fascial restricitons, Pain, Decreased balance, Difficulty walking, Improper body mechanics, Decreased range of motion, Decreased safety awareness, Postural dysfunction, Impaired flexibility  Visit Diagnosis: Pain in right hip  Stiffness of right hip, not elsewhere classified  Muscle weakness (generalized)  Difficulty in walking, not elsewhere classified  Unsteadiness on feet     Problem List Patient Active Problem List   Diagnosis Date Noted   Allergic conjunctivitis of both eyes 07/06/2020   Gait abnormality 11/18/2019   OSA on CPAP 06/04/2019   Pain in joint of left shoulder 03/28/2019   Seasonal allergic conjunctivitis 02/07/2019    Chronic intermittent hypoxia with obstructive sleep apnea 01/25/2019   Severe obstructive sleep apnea 01/25/2019   Excessive daytime sleepiness 01/03/2019   Non-restorative sleep 01/03/2019   Nocturia more than twice per night 01/03/2019   Weight gain, abnormal 01/03/2019   Snorings 01/03/2019   Trochanteric bursitis of right hip 04/27/2018   Encounter for routine screening for malformation using ultrasonics 02/08/2018   Abnormal weight gain 02/08/2018   Acid indigestion 02/08/2018   Acute antritis 02/08/2018   Class 2 severe obesity with serious comorbidity and body mass index (BMI) of 39.0 to 39.9 in adult ([place] 02/08/2018   Perennial allergic rhinitis 02/08/2018   Antiphospholipid syndrome ([place] 02/08/2018   Arthralgia of hip or thigh 02/08/2018   Asthma, cough variant 02/08/2018   Vitamin D deficiency 02/08/2018   Backhand tennis elbow 02/08/2018   Cannot sleep 02/08/2018   Chronic kidney disease (CKD), stage III (moderate) ([place] 02/08/2018   Current drug use 02/08/2018   Depression, major, single episode, in partial remission ([place] 02/08/2018   Diabetes mellitus with polyneuropathy ([place] 02/08/2018   Difficulty hearing 02/08/2018   Disturbance of skin sensation 02/08/2018   Hypertension associated with type 2 diabetes mellitus ([place] 02/08/2018   Extremity pain 02/08/2018   Facet syndrome, lumbar 02/08/2018   Factor V Leiden ([place] 02/08/2018   Fungal infection of nail 02/08/2018   Gastroenteritis and colitis, viral 02/08/2018   Hypoglycemia 02/08/2018   Menopause 02/08/2018   Other long term (current) drug therapy 02/08/2018   Other osteonecrosis, unspecified femur ([place] 02/08/2018   Pure hypercholesterolemia 02/08/2018   Recurrent major depressive episodes ([place] 02/08/2018   Syncope and collapse 02/08/2018  Moderate persistent asthma 02/08/2018   Cough, persistent 02/08/2018   Restless leg syndrome 01/15/2018   Degeneration of lumbar intervertebral disc 11/21/2017    Tremor of right hand 03/02/2017   Parkinson disease ([place]) 03/02/2017   UTI (urinary tract infection) 12/01/2015   Type 2 diabetes mellitus with stage 3b chronic kidney disease, with long-term current use of insulin ([place]) 06/03/2015   Fast heart beat 04/09/2015   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 2 diabetes mellitus ([place]) 04/24/2014   Postoperative anemia due to acute blood loss 10/10/2013   Avascular necrosis of bone of left hip ([place]) 10/09/2013   Arthritis of pelvic region, degenerative 10/09/2013   Breath shortness 06/18/2013   Diabetes mellitus type 2, uncontrolled ([place]) 05/31/2013   Intermittent claudication ([place]) 04/04/2013   Anxiety    Gastroesophageal reflux disease 09/12/2012   Clinical depression 02/21/2012   Stroke ([place]) 02/11/2012   History of revision of total replacement of right hip joint 04/04/2009     Janene Harvey, PT, DPT 10/06/20 4:49 PM    [facility] [address]  [place], [place], [address] Phone: [phone]   Fax:  [phone]  Name: Deanna Rowe MRN: [id number] Date of Birth: 1955-08-06

## 2020-10-07 ENCOUNTER — Other Ambulatory Visit (HOSPITAL_COMMUNITY): Payer: Self-pay

## 2020-10-08 ENCOUNTER — Other Ambulatory Visit: Payer: Self-pay

## 2020-10-08 ENCOUNTER — Ambulatory Visit: Payer: 59

## 2020-10-08 DIAGNOSIS — M25551 Pain in right hip: Secondary | ICD-10-CM

## 2020-10-08 DIAGNOSIS — R262 Difficulty in walking, not elsewhere classified: Secondary | ICD-10-CM

## 2020-10-08 DIAGNOSIS — M25651 Stiffness of right hip, not elsewhere classified: Secondary | ICD-10-CM | POA: Diagnosis not present

## 2020-10-08 DIAGNOSIS — R2681 Unsteadiness on feet: Secondary | ICD-10-CM | POA: Diagnosis not present

## 2020-10-08 DIAGNOSIS — M6281 Muscle weakness (generalized): Secondary | ICD-10-CM | POA: Diagnosis not present

## 2020-10-08 NOTE — Therapy (Signed)
[facility] [address]  [place], [place], [address] Phone: [phone]   Fax:  [phone]  Physical Therapy Progress Note  Patient Details  Name: MYONNA CHISOM MRN: [id number] Date of Birth: 12/12/1955 Referring Provider (PT): Rod Can, MD  Progress Note Reporting Period 08/24/20 to 10/08/20  See note below for Objective Data and Assessment of Progress/Goals.     Encounter Date: 10/08/2020   PT End of Session - 10/08/20 1752     Visit Number 20    Number of Visits 24    Date for PT Re-Evaluation 11/05/20    Authorization Type [facility]    PT Start Time 1618   pt late   PT Stop Time 1659    PT Time Calculation (min) 41 min    Activity Tolerance Patient tolerated treatment well    Behavior During Therapy WFL for tasks assessed/performed             Past Medical History:  Diagnosis Date   Anemia    Anxiety    Arthritis    L HIP   Asthma    Avascular necrosis of hip ([place])    LEFT   Back pain    Chest pain    Constipation    Depression    Diabetes mellitus    Diabetes mellitus, type II (HCC)    Dry cough    Edema, lower extremity    Gait abnormality 11/18/2019   GERD (gastroesophageal reflux disease)    High cholesterol    Hypertension    Insomnia    takes Ambien nightly   Joint pain    Kidney disease    Kidney disease    Obesity    Parkinsonism ([place]) 03/02/2017   PONV (postoperative nausea and vomiting)    RLS (restless legs syndrome) 01/15/2018   Sleep apnea    Swallowing difficulty    Thyroid disease    TIA (transient ischemic attack) 2012   no residual problems   Urgency incontinence    Vitamin D deficiency     Past Surgical History:  Procedure Laterality Date   DILATION AND CURETTAGE OF UTERUS     ESOPHAGEAL MANOMETRY N/A 09/03/2012   Procedure: ESOPHAGEAL MANOMETRY (EM);  Surgeon: Garlan Fair, MD;  Location: [facility];  Service: Endoscopy;  Laterality: N/A;    ESOPHAGEAL MANOMETRY N/A 06/19/2017   Procedure: ESOPHAGEAL MANOMETRY (EM);  Surgeon: Clarene Essex, MD;  Location: [facility];  Service: Endoscopy;  Laterality: N/A;   EXPLORATORY LAPAROTOMY     GANGLION CYST EXCISION     JOINT REPLACEMENT  2011   rt total hip   KNEE ARTHROSCOPY     PILONIDAL CYST / SINUS EXCISION     TOTAL HIP ARTHROPLASTY Left 10/09/2013   Procedure: LEFT TOTAL HIP ARTHROPLASTY ANTERIOR APPROACH;  Surgeon: [name], MD;  Location: [facility];  Service: Orthopedics;  Laterality: Left;    There were no vitals filed for this visit.   Subjective Assessment - 10/08/20 1620     Subjective Feeling much better today, things started clearing up yesterday. Came into clinic w/o [facility] because she forgot it.    Pertinent History TIA, thyroid disease, Parkinsonism, kidney disease, HTN, HLD, GERD, DMII, depression, L hip avascular necrosis, anxiety, anemia, R THA 2011, L THA 2015, knee arthroscopy    Diagnostic tests per patient- R hip xray was clear    Patient Stated Goals decrease pain    Currently in Pain?  Yes    Pain Score 5     Pain Location Hip    Pain Orientation Right    Pain Descriptors / Indicators Sharp    Pain Type Acute pain                [facility] PT Assessment - 10/08/20 0001       Assessment   Medical Diagnosis Contusion of R hip    Referring Provider (PT) Rod Can, MD    Onset Date/Surgical Date 06/15/20      Observation/Other Assessments   Focus on Therapeutic Outcomes (FOTO)  Hip; 58%      AROM   Right Hip Flexion 108    Right Hip External Rotation  28    Right Hip Internal Rotation  37      Strength   Right Hip Flexion 4/5    Right Hip ABduction 4+/5    Right Hip ADduction 4+/5    Left Hip Flexion 4+/5    Left Hip ABduction 4+/5    Left Hip ADduction 4+/5    Right Knee Flexion 5/5    Right Knee Extension 4+/5    Left Knee Flexion 4+/5    Left Knee Extension 5/5      Berg Balance Test   Sit to Stand Able to stand without using  hands and stabilize independently    Standing Unsupported Able to stand safely 2 minutes    Sitting with Back Unsupported but Feet Supported on Floor or Stool Able to sit safely and securely 2 minutes    Stand to Sit Sits safely with minimal use of hands    Transfers Able to transfer safely, minor use of hands    Standing Unsupported with Eyes Closed Able to stand 10 seconds safely    Standing Unsupported with Feet Together Able to place feet together independently but unable to hold for 30 seconds    From Standing, Reach Forward with Outstretched Arm Can reach confidently >25 cm (10")    From Standing Position, Pick up Object from Floor Able to pick up shoe safely and easily    From Standing Position, Turn to Look Behind Over each Shoulder Looks behind from both sides and weight shifts well    Turn 360 Degrees Able to turn 360 degrees safely in 4 seconds or less    Standing Unsupported, Alternately Place Feet on Step/Stool Able to stand independently and complete 8 steps >20 seconds    Standing Unsupported, One Foot in Front Needs help to step but can hold 15 seconds    Standing on One Leg Able to lift leg independently and hold equal to or more than 3 seconds    Total Score 48                           [facility] Adult PT Treatment/Exercise - 10/08/20 0001       Knee/Hip Exercises: Aerobic   Recumbent Bike L2 x 6 min                      PT Short Term Goals - 10/08/20 1819       PT SHORT TERM GOAL #1   Title Patient to be independent with initial HEP.    Status Achieved   07/30/20   Target Date 07/20/20               PT Long Term Goals - 10/08/20 1635  PT LONG TERM GOAL #1   Title Patient to be independent with advanced HEP.    Time 4    Period Weeks    Status Partially Met   met for current   Target Date 11/05/20      PT LONG TERM GOAL #2   Title Patient to demonstrate R hip AROM WFL and without pain limiting.    Time 4    Period  Weeks    Status Partially Met   improved in R hip flexion and IR; pain still persists with ER and flexion   Target Date 11/05/20      PT LONG TERM GOAL #3   Title Patient to demonstrate R LE strength >/=4+/5.    Time 4    Period Weeks    Status Partially Met   R hip flexors limited   Target Date 11/05/20      PT LONG TERM GOAL #4   Title Patient to report tolerance of 1 hour on her feet without pain limiting.    Time 4    Period Weeks    Status Partially Met   45 min until she has to sit; 20 min it starts to hurt   Target Date 11/05/20      PT LONG TERM GOAL #5   Title Patient to score 48/56 on Berg in order to decrease risk of falls.    Time 6    Period Weeks    Status Achieved   7/7/22Merrilee Jansky = 48/56                  Plan - 10/08/20 1753     Clinical Impression Statement Pt came into clinic w/o reports of dizziness, lightheadedness, and increase stability. She noted that she was in the sun a lot on Tuesday which mostly makes her feel dizzy from getting too much sun. She ambulated into clinic without her [facility] as she stated that she forgot it. She shows improvement with R LE strength but still weak in the hip flexors. She shows increases with hip AROM but still experiencing pain with motions. She improved her BERG score today to 48/56 and has met this goal. Standing tolerance is still limited by 15 mins of her LTG but she is able to stand ~45 min until requiring seated breaks. She has made progress but she wants to continue working on more balance exercises. She showed the greatest challenge today with narrow BOS exercises and SL exercises. She would continue to benefit from PT for 1x a week for 4 weeks to address deficits in strength, ROM, activity tolerance, and balance.    Personal Factors and Comorbidities Age;Comorbidity 3+;Fitness;Past/Current Experience;Time since onset of injury/illness/exacerbation    Comorbidities TIA, thyroid disease, Parkinsonism, kidney disease, HTN,  HLD, GERD, DMII, depression, L hip avascular necrosis, anxiety, anemia, R THA 2011, L THA 2015, knee arthroscopy    PT Frequency 1x / week    PT Duration 4 weeks    PT Treatment/Interventions ADLs/Self Care Home Management;Cryotherapy;Electrical Stimulation;Iontophoresis 57m/ml Dexamethasone;Moist Heat;Balance training;Therapeutic exercise;Therapeutic activities;Functional mobility training;Stair training;Gait training;Ultrasound;Neuromuscular re-education;Patient/family education;Manual techniques;Taping;Energy conservation;Dry needling;Passive range of motion;Scar mobilization    PT Next Visit Plan progress R hip AROM and R LE strengthening to tolerance; STS transfers, balance training with narrow BOS & rotation; review seated & standing PWR! moves & progress to other positions as indicated    Consulted and Agree with Plan of Care Patient             Patient  will benefit from skilled therapeutic intervention in order to improve the following deficits and impairments:  Abnormal gait, Decreased activity tolerance, Decreased strength, Increased fascial restricitons, Pain, Decreased balance, Difficulty walking, Improper body mechanics, Decreased range of motion, Decreased safety awareness, Postural dysfunction, Impaired flexibility  Visit Diagnosis: Pain in right hip  Stiffness of right hip, not elsewhere classified  Muscle weakness (generalized)  Difficulty in walking, not elsewhere classified  Unsteadiness on feet     Problem List Patient Active Problem List   Diagnosis Date Noted   Allergic conjunctivitis of both eyes 07/06/2020   Gait abnormality 11/18/2019   OSA on CPAP 06/04/2019   Pain in joint of left shoulder 03/28/2019   Seasonal allergic conjunctivitis 02/07/2019   Chronic intermittent hypoxia with obstructive sleep apnea 01/25/2019   Severe obstructive sleep apnea 01/25/2019   Excessive daytime sleepiness 01/03/2019   Non-restorative sleep 01/03/2019   Nocturia more  than twice per night 01/03/2019   Weight gain, abnormal 01/03/2019   Snorings 01/03/2019   Trochanteric bursitis of right hip 04/27/2018   Encounter for routine screening for malformation using ultrasonics 02/08/2018   Abnormal weight gain 02/08/2018   Acid indigestion 02/08/2018   Acute antritis 02/08/2018   Class 2 severe obesity with serious comorbidity and body mass index (BMI) of 39.0 to 39.9 in adult ([place]) 02/08/2018   Perennial allergic rhinitis 02/08/2018   Antiphospholipid syndrome ([place]) 02/08/2018   Arthralgia of hip or thigh 02/08/2018   Asthma, cough variant 02/08/2018   Vitamin D deficiency 02/08/2018   Backhand tennis elbow 02/08/2018   Cannot sleep 02/08/2018   Chronic kidney disease (CKD), stage III (moderate) ([place]) 02/08/2018   Current drug use 02/08/2018   Depression, major, single episode, in partial remission ([place]) 02/08/2018   Diabetes mellitus with polyneuropathy ([place]) 02/08/2018   Difficulty hearing 02/08/2018   Disturbance of skin sensation 02/08/2018   Hypertension associated with type 2 diabetes mellitus ([place]) 02/08/2018   Extremity pain 02/08/2018   Facet syndrome, lumbar 02/08/2018   Factor V Leiden ([place]) 02/08/2018   Fungal infection of nail 02/08/2018   Gastroenteritis and colitis, viral 02/08/2018   Hypoglycemia 02/08/2018   Menopause 02/08/2018   Other long term (current) drug therapy 02/08/2018   Other osteonecrosis, unspecified femur ([place]) 02/08/2018   Pure hypercholesterolemia 02/08/2018   Recurrent major depressive episodes ([place]) 02/08/2018   Syncope and collapse 02/08/2018   Moderate persistent asthma 02/08/2018   Cough, persistent 02/08/2018   Restless leg syndrome 01/15/2018   Degeneration of lumbar intervertebral disc 11/21/2017   Tremor of right hand 03/02/2017   Parkinson disease ([place]) 03/02/2017   UTI (urinary tract infection) 12/01/2015   Type 2 diabetes mellitus with stage 3b chronic kidney disease, with long-term current use of  insulin ([place]) 06/03/2015   Fast heart beat 04/09/2015   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 2 diabetes mellitus ([place]) 04/24/2014   Postoperative anemia due to acute blood loss 10/10/2013   Avascular necrosis of bone of left hip ([place]) 10/09/2013   Arthritis of pelvic region, degenerative 10/09/2013   Breath shortness 06/18/2013   Diabetes mellitus type 2, uncontrolled ([place]) 05/31/2013   Intermittent claudication ([place]) 04/04/2013   Anxiety    Gastroesophageal reflux disease 09/12/2012   Clinical depression 02/21/2012   Stroke ([place]) 02/11/2012   History of revision of total replacement of right hip joint 04/04/2009    Clarene Essex, PTA 10/08/2020, 6:26 PM    Patient continues to demonstrate improvement towards all goals. She was able to reach her balance goal  today and frequency of her reported falls seems to be reducing. Patient is still limited in functional activity tolerance by her R knee pain and will continue to benefit from skilled PT services 1x/week for 4 weeks to address remaining goals.   Janene Harvey, PT, DPT 10/08/20 6:29 PM   [facility] [address]  [place], [place], [address] Phone: [phone]   Fax:  [phone]  Name: GEORGIE EDUARDO MRN: [id number] Date of Birth: 1955/12/31

## 2020-10-08 NOTE — Addendum Note (Signed)
Addended by: Janene Harvey D on: 10/08/2020 06:33 PM   Modules accepted: Orders

## 2020-10-08 NOTE — Therapy (Signed)
[facility] [address]  [place], [place], [address] Phone: [phone]   Fax:  [phone]  Physical Therapy Treatment  Patient Details  Name: Deanna Rowe MRN: [id number] Date of Birth: [birth year] Referring Provider (PT): Rod Can, MD   Encounter Date: 10/08/2020   PT End of Session - 10/08/20 1752     Visit Number 20    Number of Visits 24    Date for PT Re-Evaluation 10/08/20    Authorization Type [facility]    PT Start Time 1618   pt late   PT Stop Time 1659    PT Time Calculation (min) 41 min    Activity Tolerance Patient tolerated treatment well    Behavior During Therapy [facility] for tasks assessed/performed             Past Medical History:  Diagnosis Date   Anemia    Anxiety    Arthritis    L HIP   Asthma    Avascular necrosis of hip ([place])    LEFT   Back pain    Chest pain    Constipation    Depression    Diabetes mellitus    Diabetes mellitus, type II ([place])    Dry cough    Edema, lower extremity    Gait abnormality 11/18/2019   GERD (gastroesophageal reflux disease)    High cholesterol    Hypertension    Insomnia    takes Ambien nightly   Joint pain    Kidney disease    Kidney disease    Obesity    Parkinsonism ([place]) 03/02/2017   PONV (postoperative nausea and vomiting)    RLS (restless legs syndrome) 01/15/2018   Sleep apnea    Swallowing difficulty    Thyroid disease    TIA (transient ischemic attack) 2012   no residual problems   Urgency incontinence    Vitamin D deficiency     Past Surgical History:  Procedure Laterality Date   DILATION AND CURETTAGE OF UTERUS     ESOPHAGEAL MANOMETRY N/A 09/03/2012   Procedure: ESOPHAGEAL MANOMETRY (EM);  Surgeon: Garlan Fair, MD;  Location: [facility];  Service: Endoscopy;  Laterality: N/A;   ESOPHAGEAL MANOMETRY N/A 06/19/2017   Procedure: ESOPHAGEAL MANOMETRY (EM);  Surgeon: Clarene Essex, MD;  Location: [facility];   Service: Endoscopy;  Laterality: N/A;   EXPLORATORY LAPAROTOMY     GANGLION CYST EXCISION     JOINT REPLACEMENT  2011   rt total hip   KNEE ARTHROSCOPY     PILONIDAL CYST / SINUS EXCISION     TOTAL HIP ARTHROPLASTY Left 10/09/2013   Procedure: LEFT TOTAL HIP ARTHROPLASTY ANTERIOR APPROACH;  Surgeon: [name], MD;  Location: [facility];  Service: Orthopedics;  Laterality: Left;    There were no vitals filed for this visit.   Subjective Assessment - 10/08/20 1620     Subjective Feeling much better today, things started clearing up yesterday. Came into clinic w/o [facility] because she forgot it.    Pertinent History TIA, thyroid disease, Parkinsonism, kidney disease, HTN, HLD, GERD, DMII, depression, L hip avascular necrosis, anxiety, anemia, R THA 2011, L THA 2015, knee arthroscopy    Diagnostic tests per patient- R hip xray was clear    Patient Stated Goals decrease pain    Currently in Pain? Yes    Pain Score 5     Pain Location Hip    Pain Orientation Right  Pain Descriptors / Indicators Sharp    Pain Type Acute pain                [facility] PT Assessment - 10/08/20 0001       Assessment   Medical Diagnosis Contusion of R hip    Referring Provider (PT) Rod Can, MD    Onset Date/Surgical Date 06/15/20      Observation/Other Assessments   Focus on Therapeutic Outcomes (FOTO)  Hip; 58%      AROM   Right Hip Flexion 108    Right Hip External Rotation  28    Right Hip Internal Rotation  37      Strength   Right Hip Flexion 4/5    Right Hip ABduction 4+/5    Right Hip ADduction 4+/5    Left Hip Flexion 4+/5    Left Hip ABduction 4+/5    Left Hip ADduction 4+/5    Right Knee Flexion 5/5    Right Knee Extension 4+/5    Left Knee Flexion 4+/5    Left Knee Extension 5/5      Berg Balance Test   Sit to Stand Able to stand without using hands and stabilize independently    Standing Unsupported Able to stand safely 2 minutes    Sitting with Back Unsupported but  Feet Supported on Floor or Stool Able to sit safely and securely 2 minutes    Stand to Sit Sits safely with minimal use of hands    Transfers Able to transfer safely, minor use of hands    Standing Unsupported with Eyes Closed Able to stand 10 seconds safely    Standing Unsupported with Feet Together Able to place feet together independently but unable to hold for 30 seconds    From Standing, Reach Forward with Outstretched Arm Can reach confidently >25 cm (10")    From Standing Position, Pick up Object from Floor Able to pick up shoe safely and easily    From Standing Position, Turn to Look Behind Over each Shoulder Looks behind from both sides and weight shifts well    Turn 360 Degrees Able to turn 360 degrees safely in 4 seconds or less    Standing Unsupported, Alternately Place Feet on Step/Stool Able to stand independently and complete 8 steps >20 seconds    Standing Unsupported, One Foot in Front Needs help to step but can hold 15 seconds    Standing on One Leg Able to lift leg independently and hold equal to or more than 3 seconds    Total Score 48                           [facility] Adult PT Treatment/Exercise - 10/08/20 0001       Knee/Hip Exercises: Aerobic   Recumbent Bike L2 x 6 min                      PT Short Term Goals - 10/08/20 1819       PT SHORT TERM GOAL #1   Title Patient to be independent with initial HEP.    Status Achieved   07/30/20   Target Date 07/20/20               PT Long Term Goals - 10/08/20 1635       PT LONG TERM GOAL #1   Title Patient to be independent with advanced HEP.    Time 4  Period Weeks    Status Partially Met   met for current   Target Date 11/05/20      PT LONG TERM GOAL #2   Title Patient to demonstrate R hip AROM WFL and without pain limiting.    Time 4    Period Weeks    Status Partially Met   improved in R hip flexion and IR; pain still persists with ER and flexion   Target Date 11/05/20       PT LONG TERM GOAL #3   Title Patient to demonstrate R LE strength >/=4+/5.    Time 4    Period Weeks    Status Partially Met   R hip flexors limited   Target Date 11/05/20      PT LONG TERM GOAL #4   Title Patient to report tolerance of 1 hour on her feet without pain limiting.    Time 4    Period Weeks    Status Partially Met   45 min until she has to sit; 20 min it starts to hurt   Target Date 11/05/20      PT LONG TERM GOAL #5   Title Patient to score 48/56 on Berg in order to decrease risk of falls.    Time 6    Period Weeks    Status Achieved   7/7/22Merrilee Jansky = 48/56                  Plan - 10/08/20 1753     Clinical Impression Statement Pt came into clinic w/o reports of dizziness, lightheadedness, and increase stability. She noted that she was in the sun a lot on Tuesday which mostly makes her feel dizzy from getting too much sun. She ambulated into clinic without her [facility] as she stated that she forgot it. She shows improvement with R LE strength but still weak in the hip flexors. She shows increases with hip AROM but still experiencing pain with motions. She improved her BERG score today to 48/56 and has met this goal. Standing tolerance is still limited by 15 mins of her LTG but she is able to stand ~45 min until requiring seated breaks. She has made progress but she wants to continue working on more balance exercises. She showed the greatest challenge today with narrow BOS exercises and SL exercises. She would continue to benefit from PT for 1x a week for 4 weeks to address deficits in strength, ROM, activity tolerance, and balance.    Personal Factors and Comorbidities Age;Comorbidity 3+;Fitness;Past/Current Experience;Time since onset of injury/illness/exacerbation    Comorbidities TIA, thyroid disease, Parkinsonism, kidney disease, HTN, HLD, GERD, DMII, depression, L hip avascular necrosis, anxiety, anemia, R THA 2011, L THA 2015, knee arthroscopy    PT Frequency  1x / week    PT Duration 4 weeks    PT Treatment/Interventions ADLs/Self Care Home Management;Cryotherapy;Electrical Stimulation;Iontophoresis 41m/ml Dexamethasone;Moist Heat;Balance training;Therapeutic exercise;Therapeutic activities;Functional mobility training;Stair training;Gait training;Ultrasound;Neuromuscular re-education;Patient/family education;Manual techniques;Taping;Energy conservation;Dry needling;Passive range of motion;Scar mobilization    PT Next Visit Plan progress R hip AROM and R LE strengthening to tolerance; STS transfers, balance training with narrow BOS & rotation; review seated & standing PWR! moves & progress to other positions as indicated    Consulted and Agree with Plan of Care Patient             Patient will benefit from skilled therapeutic intervention in order to improve the following deficits and impairments:  Abnormal gait, Decreased activity tolerance, Decreased strength,  Increased fascial restricitons, Pain, Decreased balance, Difficulty walking, Improper body mechanics, Decreased range of motion, Decreased safety awareness, Postural dysfunction, Impaired flexibility  Visit Diagnosis: Pain in right hip  Stiffness of right hip, not elsewhere classified  Muscle weakness (generalized)  Difficulty in walking, not elsewhere classified  Unsteadiness on feet     Problem List Patient Active Problem List   Diagnosis Date Noted   Allergic conjunctivitis of both eyes 07/06/2020   Gait abnormality 11/18/2019   OSA on CPAP 06/04/2019   Pain in joint of left shoulder 03/28/2019   Seasonal allergic conjunctivitis 02/07/2019   Chronic intermittent hypoxia with obstructive sleep apnea 01/25/2019   Severe obstructive sleep apnea 01/25/2019   Excessive daytime sleepiness 01/03/2019   Non-restorative sleep 01/03/2019   Nocturia more than twice per night 01/03/2019   Weight gain, abnormal 01/03/2019   Snorings 01/03/2019   Trochanteric bursitis of right hip  04/27/2018   Encounter for routine screening for malformation using ultrasonics 02/08/2018   Abnormal weight gain 02/08/2018   Acid indigestion 02/08/2018   Acute antritis 02/08/2018   Class 2 severe obesity with serious comorbidity and body mass index (BMI) of 39.0 to 39.9 in adult ([place]) 02/08/2018   Perennial allergic rhinitis 02/08/2018   Antiphospholipid syndrome ([place]) 02/08/2018   Arthralgia of hip or thigh 02/08/2018   Asthma, cough variant 02/08/2018   Vitamin D deficiency 02/08/2018   Backhand tennis elbow 02/08/2018   Cannot sleep 02/08/2018   Chronic kidney disease (CKD), stage III (moderate) ([place]) 02/08/2018   Current drug use 02/08/2018   Depression, major, single episode, in partial remission ([place]) 02/08/2018   Diabetes mellitus with polyneuropathy ([place]) 02/08/2018   Difficulty hearing 02/08/2018   Disturbance of skin sensation 02/08/2018   Hypertension associated with type 2 diabetes mellitus ([place]) 02/08/2018   Extremity pain 02/08/2018   Facet syndrome, lumbar 02/08/2018   Factor V Leiden ([place]) 02/08/2018   Fungal infection of nail 02/08/2018   Gastroenteritis and colitis, viral 02/08/2018   Hypoglycemia 02/08/2018   Menopause 02/08/2018   Other long term (current) drug therapy 02/08/2018   Other osteonecrosis, unspecified femur ([place]) 02/08/2018   Pure hypercholesterolemia 02/08/2018   Recurrent major depressive episodes ([place]) 02/08/2018   Syncope and collapse 02/08/2018   Moderate persistent asthma 02/08/2018   Cough, persistent 02/08/2018   Restless leg syndrome 01/15/2018   Degeneration of lumbar intervertebral disc 11/21/2017   Tremor of right hand 03/02/2017   Parkinson disease ([place]) 03/02/2017   UTI (urinary tract infection) 12/01/2015   Type 2 diabetes mellitus with stage 3b chronic kidney disease, with long-term current use of insulin ([place]) 06/03/2015   Fast heart beat 04/09/2015   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 2 diabetes mellitus ([place]) 04/24/2014    Postoperative anemia due to acute blood loss 10/10/2013   Avascular necrosis of bone of left hip ([place]) 10/09/2013   Arthritis of pelvic region, degenerative 10/09/2013   Breath shortness 06/18/2013   Diabetes mellitus type 2, uncontrolled ([place]) 05/31/2013   Intermittent claudication ([place]) 04/04/2013   Anxiety    Gastroesophageal reflux disease 09/12/2012   Clinical depression 02/21/2012   Stroke ([place]) 02/11/2012   History of revision of total replacement of right hip joint 04/04/2009    Manuela Neptune 10/08/2020, 6:21 PM  [facility] [address]  [place], [place], [address] Phone: [phone]   Fax:  [phone]  Name: MILANIE ROSENFIELD MRN: [id number] Date of Birth: [birth year]

## 2020-10-08 NOTE — Progress Notes (Signed)
Chief Complaint:   OBESITY Deanna Rowe is here to discuss her progress with her obesity treatment plan along with follow-up of her obesity related diagnoses. Amneet is on the Category 3 Plan and states she is following her eating plan approximately 50% of the time. Elisea states she is doing physical therapy for 45 minutes 2 times per week.  Today's visit was #: 42 Starting weight: 237 lbs Starting date: 04/16/2019 Today's weight: 210 lbs Today's date: 10/01/2020 Total lbs lost to date: 27 Total lbs lost since last in-office visit: 1  Interim History: Deanna Rowe is now eating lunch whereas she had been skipping. Her water intake is good. She is doing physical therapy 2 times per week. They are working on her balance and mobility. She and her husband eat out 2 times per week.  Subjective:   1. Type 2 diabetes mellitus with stage 3b chronic kidney disease, with long-term current use of insulin (HCC) Deanna Rowe's last A1c was 7.0 in February 2022 at Dr. Cindra Eves office. She is on Basaglar 10 units and Humalog TID 14,12,16 units. She is on Ozempic 0.25 mg. She denies hypoglycemia.  Lab Results  Component Value Date   HGBA1C 7.9 (H) 04/16/2019   HGBA1C 6.9 11/25/2016   HGBA1C 6.0 06/10/2016   Lab Results  Component Value Date   MICROALBUR <0.7 02/12/2016   LDLCALC 57 04/16/2019   CREATININE 1.43 (H) 03/02/2020   Lab Results  Component Value Date   INSULIN 12.3 04/16/2019   2. Hypertension associated with diabetes ([place]) Deanna Rowe's blood pressure ranges between 150/90-170-90 at home. Her blood pressure's here are low. She denies dizziness. She has been taking Hyzaar and Norvasc. Her blood pressure today in the office is 94/55.  BP Readings from Last 3 Encounters:  10/06/20 105/71  10/01/20 (!) 94/55  09/03/20 114/76   Lab Results  Component Value Date   CREATININE 1.43 (H) 03/02/2020   CREATININE 1.10 (H) 04/16/2019   CREATININE 1.58 (H) 10/12/2018   Assessment/Plan:   1. Type  2 diabetes mellitus with stage 3b chronic kidney disease, with long-term current use of insulin (HCC) Buford will continue all her medications. FU with Dr. Buddy Duty as directed.   2. Hypertension associated with diabetes ([place]) Gizelle will continue all her medications. She is to bring in her home blood pressure cuff to her next office visit fir [name] to check for accuracy.   3. Obesity: BMI 34.07 Deanna Rowe is currently in the action stage of change. As such, her goal is to continue with weight loss efforts. She has agreed to the Category 3 Plan.   Exercise goals: As is.  Behavioral modification strategies: increasing lean protein intake.  Milah has agreed to follow-up with our clinic in 3 weeks.  Objective:   Blood pressure (!) 94/55, pulse 74, temperature 97.8 F (36.6 C), temperature source Oral, height 5\' 6"  (1.676 m), weight 210 lb (95.3 kg), SpO2 95 %. Body mass index is 33.89 kg/m.  General: Cooperative, alert, well developed, in no acute distress. HEENT: Conjunctivae and lids unremarkable. Cardiovascular: Regular rhythm.  Lungs: Normal work of breathing. Neurologic: No focal deficits.   Lab Results  Component Value Date   CREATININE 1.43 (H) 03/02/2020   BUN 13 03/02/2020   NA 136 03/02/2020   K 4.7 03/02/2020   CL 98 03/02/2020   CO2 27 03/02/2020   Lab Results  Component Value Date   ALT 21 04/16/2019   AST 23 04/16/2019   ALKPHOS 58 04/16/2019   BILITOT  0.3 04/16/2019   Lab Results  Component Value Date   HGBA1C 7.9 (H) 04/16/2019   HGBA1C 6.9 11/25/2016   HGBA1C 6.0 06/10/2016   HGBA1C 5.9 02/12/2016   HGBA1C 6.1 12/01/2015   Lab Results  Component Value Date   INSULIN 12.3 04/16/2019   Lab Results  Component Value Date   TSH 1.780 04/16/2019   Lab Results  Component Value Date   CHOL 155 04/16/2019   HDL 84 04/16/2019   LDLCALC 57 04/16/2019   TRIG 71 04/16/2019   Lab Results  Component Value Date   VD25OH 44.8 12/17/2019   VD25OH 40.9  04/16/2019   Lab Results  Component Value Date   WBC 6.8 04/16/2019   HGB 12.3 04/16/2019   HCT 36.1 04/16/2019   MCV 96 04/16/2019   PLT 274 04/16/2019   No results found for: IRON, TIBC, FERRITIN  Attestation Statements:   Reviewed by clinician on day of visit: allergies, medications, problem list, medical history, surgical history, family history, social history, and previous encounter notes.   Wilhemena Durie, am acting as Location manager for [employer], FNP-C.  I have reviewed the above documentation for accuracy and completeness, and I agree with the above. -  Georgianne Fick, FNP

## 2020-10-09 ENCOUNTER — Other Ambulatory Visit (HOSPITAL_BASED_OUTPATIENT_CLINIC_OR_DEPARTMENT_OTHER): Payer: Self-pay

## 2020-10-09 MED FILL — Furosemide Tab 20 MG: ORAL | 30 days supply | Qty: 15 | Fill #0 | Status: AC

## 2020-10-12 ENCOUNTER — Other Ambulatory Visit (HOSPITAL_COMMUNITY): Payer: Self-pay

## 2020-10-12 ENCOUNTER — Encounter (INDEPENDENT_AMBULATORY_CARE_PROVIDER_SITE_OTHER): Payer: Self-pay | Admitting: Family Medicine

## 2020-10-12 ENCOUNTER — Other Ambulatory Visit (HOSPITAL_BASED_OUTPATIENT_CLINIC_OR_DEPARTMENT_OTHER): Payer: Self-pay

## 2020-10-12 ENCOUNTER — Other Ambulatory Visit: Payer: Self-pay | Admitting: Endocrinology

## 2020-10-12 DIAGNOSIS — M7061 Trochanteric bursitis, right hip: Secondary | ICD-10-CM | POA: Diagnosis not present

## 2020-10-13 ENCOUNTER — Other Ambulatory Visit (HOSPITAL_COMMUNITY): Payer: Self-pay

## 2020-10-13 ENCOUNTER — Other Ambulatory Visit (HOSPITAL_BASED_OUTPATIENT_CLINIC_OR_DEPARTMENT_OTHER): Payer: Self-pay

## 2020-10-14 ENCOUNTER — Other Ambulatory Visit (HOSPITAL_BASED_OUTPATIENT_CLINIC_OR_DEPARTMENT_OTHER): Payer: Self-pay

## 2020-10-14 ENCOUNTER — Other Ambulatory Visit (HOSPITAL_COMMUNITY): Payer: Self-pay

## 2020-10-14 DIAGNOSIS — F251 Schizoaffective disorder, depressive type: Secondary | ICD-10-CM | POA: Diagnosis not present

## 2020-10-14 MED ORDER — ROPINIROLE HCL 2 MG PO TABS
ORAL_TABLET | Freq: Two times a day (BID) | ORAL | 1 refills | Status: DC
  Filled 2020-12-18: qty 180, 90d supply, fill #0
  Filled 2021-04-05: qty 180, 90d supply, fill #1

## 2020-10-14 MED ORDER — TRINTELLIX 20 MG PO TABS
ORAL_TABLET | ORAL | 1 refills | Status: DC
  Filled 2020-10-23: qty 90, 90d supply, fill #0
  Filled 2021-02-04: qty 90, 90d supply, fill #1

## 2020-10-14 MED ORDER — LORAZEPAM 2 MG PO TABS
ORAL_TABLET | ORAL | 1 refills | Status: DC
  Filled 2020-12-18: qty 270, 90d supply, fill #0
  Filled 2021-04-05: qty 270, 90d supply, fill #1

## 2020-10-14 MED ORDER — REXULTI 2 MG PO TABS
ORAL_TABLET | ORAL | 1 refills | Status: DC
  Filled 2020-10-23: qty 90, 90d supply, fill #0
  Filled 2021-02-04: qty 90, 90d supply, fill #1

## 2020-10-14 MED ORDER — BUPROPION HCL ER (XL) 150 MG PO TB24
ORAL_TABLET | ORAL | 1 refills | Status: DC
  Filled 2020-10-23: qty 270, 90d supply, fill #0
  Filled 2021-01-20: qty 270, 90d supply, fill #1

## 2020-10-14 MED ORDER — ZOLPIDEM TARTRATE 5 MG PO TABS: ORAL_TABLET | ORAL | 1 refills | Status: DC

## 2020-10-15 ENCOUNTER — Other Ambulatory Visit: Payer: Self-pay

## 2020-10-15 ENCOUNTER — Ambulatory Visit: Payer: 59

## 2020-10-15 DIAGNOSIS — M6281 Muscle weakness (generalized): Secondary | ICD-10-CM

## 2020-10-15 DIAGNOSIS — R2681 Unsteadiness on feet: Secondary | ICD-10-CM

## 2020-10-15 DIAGNOSIS — M25551 Pain in right hip: Secondary | ICD-10-CM | POA: Diagnosis not present

## 2020-10-15 DIAGNOSIS — R262 Difficulty in walking, not elsewhere classified: Secondary | ICD-10-CM

## 2020-10-15 DIAGNOSIS — M25651 Stiffness of right hip, not elsewhere classified: Secondary | ICD-10-CM

## 2020-10-15 NOTE — Therapy (Signed)
[facility] [address]  [place], [place], [address] Phone: [phone]   Fax:  [phone]  Physical Therapy Treatment  Patient Details  Name: Deanna Rowe MRN: [id number] Date of Birth: 12-05-1955 Referring Provider (PT): Rod Can, MD   Encounter Date: 10/15/2020   PT End of Session - 10/15/20 1819     Visit Number 21    Number of Visits 24    Date for PT Re-Evaluation 11/05/20    Authorization Type [facility]    PT Start Time 1619    PT Stop Time 1658    PT Time Calculation (min) 39 min    Activity Tolerance Patient tolerated treatment well    Behavior During Therapy [facility] for tasks assessed/performed             Past Medical History:  Diagnosis Date   Anemia    Anxiety    Arthritis    L HIP   Asthma    Avascular necrosis of hip ([place])    LEFT   Back pain    Chest pain    Constipation    Depression    Diabetes mellitus    Diabetes mellitus, type II ([place])    Dry cough    Edema, lower extremity    Gait abnormality 11/18/2019   GERD (gastroesophageal reflux disease)    High cholesterol    Hypertension    Insomnia    takes Ambien nightly   Joint pain    Kidney disease    Kidney disease    Obesity    Parkinsonism ([place]) 03/02/2017   PONV (postoperative nausea and vomiting)    RLS (restless legs syndrome) 01/15/2018   Sleep apnea    Swallowing difficulty    Thyroid disease    TIA (transient ischemic attack) 2012   no residual problems   Urgency incontinence    Vitamin D deficiency     Past Surgical History:  Procedure Laterality Date   DILATION AND CURETTAGE OF UTERUS     ESOPHAGEAL MANOMETRY N/A 09/03/2012   Procedure: ESOPHAGEAL MANOMETRY (EM);  Surgeon: Garlan Fair, MD;  [facility];  Service: Endoscopy;  Laterality: N/A;   ESOPHAGEAL MANOMETRY N/A 06/19/2017   Procedure: ESOPHAGEAL MANOMETRY (EM);  Surgeon: Clarene Essex, MD;  [facility];  Service:  Endoscopy;  Laterality: N/A;   EXPLORATORY LAPAROTOMY     GANGLION CYST EXCISION     JOINT REPLACEMENT  2011   rt total hip   KNEE ARTHROSCOPY     PILONIDAL CYST / SINUS EXCISION     TOTAL HIP ARTHROPLASTY Left 10/09/2013   Procedure: LEFT TOTAL HIP ARTHROPLASTY ANTERIOR APPROACH;  Surgeon: [name], MD;  [facility]: [facility];  Service: Orthopedics;  Laterality: Left;    There were no vitals filed for this visit.   Subjective Assessment - 10/15/20 1620     Subjective Pt has got an injection for her R hip. PA said that her hip muscles need to strengthen more.    Pertinent History TIA, thyroid disease, Parkinsonism, kidney disease, HTN, HLD, GERD, DMII, depression, L hip avascular necrosis, anxiety, anemia, R THA 2011, L THA 2015, knee arthroscopy    Diagnostic tests per patient- R hip xray was clear    Patient Stated Goals decrease pain    Currently in Pain? No/denies  [facility] Adult PT Treatment/Exercise - 10/15/20 0001       Ambulation/Gait   Ambulation Distance (Feet) 180 Feet    Assistive device Straight cane    Gait Pattern Step-through pattern;Decreased step length - left;Decreased step length - right;Right flexed knee in stance;Left flexed knee in stance;Trunk flexed      Knee/Hip Exercises: Aerobic   Recumbent Bike L2 x 6 min      Knee/Hip Exercises: Standing   Hip Flexion Stengthening;Both;10 reps;Knee straight    Hip Flexion Limitations 2#    Hip Abduction Stengthening;Both;10 reps;Knee straight    Abduction Limitations 2#    Hip Extension Stengthening;Both;10 reps;Knee straight    Extension Limitations 2#    Forward Step Up Right;Hand Hold: 2;Step Height: 6";20 reps    Forward Step Up Limitations cues for slow and controlled movement    Other Standing Knee Exercises alt toe taps onto treadmill 10x with 2# weights      Knee/Hip Exercises: Seated   Sit to Sand 2 sets;5 reps;with UE support   red loop above knees                      PT Short Term Goals - 10/08/20 1819       PT SHORT TERM GOAL #1   Title Patient to be independent with initial HEP.    Status Achieved   07/30/20   Target Date 07/20/20               PT Long Term Goals - 10/08/20 1635       PT LONG TERM GOAL #1   Title Patient to be independent with advanced HEP.    Time 4    Period Weeks    Status Partially Met   met for current   Target Date 11/05/20      PT LONG TERM GOAL #2   Title Patient to demonstrate R hip AROM WFL and without pain limiting.    Time 4    Period Weeks    Status Partially Met   improved in R hip flexion and IR; pain still persists with ER and flexion   Target Date 11/05/20      PT LONG TERM GOAL #3   Title Patient to demonstrate R LE strength >/=4+/5.    Time 4    Period Weeks    Status Partially Met   R hip flexors limited   Target Date 11/05/20      PT LONG TERM GOAL #4   Title Patient to report tolerance of 1 hour on her feet without pain limiting.    Time 4    Period Weeks    Status Partially Met   45 min until she has to sit; 20 min it starts to hurt   Target Date 11/05/20      PT LONG TERM GOAL #5   Title Patient to score 48/56 on Berg in order to decrease risk of falls.    Time 6    Period Weeks    Status Achieved   7/7/22Merrilee Jansky = 48/56                  Plan - 10/15/20 1821     Clinical Impression Statement Pt has been doing well. No reports of dizziness or lightheadedness today. She has recieved an injection to her R hip from her MD and already notes relief from it. She continue to need cues for proper gait pattern with SPC and  continues with flexed posture during gait. Cues required to reduce valgus at knee during squats and STS. She would benefit from more gait training with SPC and hip strengthing for improved stability.    Personal Factors and Comorbidities Age;Comorbidity 3+;Fitness;Past/Current Experience;Time since onset of injury/illness/exacerbation     Comorbidities TIA, thyroid disease, Parkinsonism, kidney disease, HTN, HLD, GERD, DMII, depression, L hip avascular necrosis, anxiety, anemia, R THA 2011, L THA 2015, knee arthroscopy    PT Frequency 1x / week    PT Duration 4 weeks    PT Treatment/Interventions ADLs/Self Care Home Management;Cryotherapy;Electrical Stimulation;Iontophoresis 1m/ml Dexamethasone;Moist Heat;Balance training;Therapeutic exercise;Therapeutic activities;Functional mobility training;Stair training;Gait training;Ultrasound;Neuromuscular re-education;Patient/family education;Manual techniques;Taping;Energy conservation;Dry needling;Passive range of motion;Scar mobilization    PT Next Visit Plan progress R hip AROM and R LE strengthening to tolerance; STS transfers, balance training with narrow BOS & rotation; review seated & standing PWR! moves & progress to other positions as indicated    Consulted and Agree with Plan of Care Patient             Patient will benefit from skilled therapeutic intervention in order to improve the following deficits and impairments:  Abnormal gait, Decreased activity tolerance, Decreased strength, Increased fascial restricitons, Pain, Decreased balance, Difficulty walking, Improper body mechanics, Decreased range of motion, Decreased safety awareness, Postural dysfunction, Impaired flexibility  Visit Diagnosis: Pain in right hip  Stiffness of right hip, not elsewhere classified  Muscle weakness (generalized)  Difficulty in walking, not elsewhere classified  Unsteadiness on feet     Problem List Patient Active Problem List   Diagnosis Date Noted   Allergic conjunctivitis of both eyes 07/06/2020   Gait abnormality 11/18/2019   OSA on CPAP 06/04/2019   Pain in joint of left shoulder 03/28/2019   Seasonal allergic conjunctivitis 02/07/2019   Chronic intermittent hypoxia with obstructive sleep apnea 01/25/2019   Severe obstructive sleep apnea 01/25/2019   Excessive daytime  sleepiness 01/03/2019   Non-restorative sleep 01/03/2019   Nocturia more than twice per night 01/03/2019   Weight gain, abnormal 01/03/2019   Snorings 01/03/2019   Trochanteric bursitis of right hip 04/27/2018   Encounter for routine screening for malformation using ultrasonics 02/08/2018   Abnormal weight gain 02/08/2018   Acid indigestion 02/08/2018   Acute antritis 02/08/2018   Class 2 severe obesity with serious comorbidity and body mass index (BMI) of 39.0 to 39.9 in adult ([place] 02/08/2018   Perennial allergic rhinitis 02/08/2018   Antiphospholipid syndrome ([place] 02/08/2018   Arthralgia of hip or thigh 02/08/2018   Asthma, cough variant 02/08/2018   Vitamin D deficiency 02/08/2018   Backhand tennis elbow 02/08/2018   Cannot sleep 02/08/2018   Chronic kidney disease (CKD), stage III (moderate) ([place] 02/08/2018   Current drug use 02/08/2018   Depression, major, single episode, in partial remission ([place] 02/08/2018   Diabetes mellitus with polyneuropathy ([place] 02/08/2018   Difficulty hearing 02/08/2018   Disturbance of skin sensation 02/08/2018   Hypertension associated with diabetes ([place] 02/08/2018   Extremity pain 02/08/2018   Facet syndrome, lumbar 02/08/2018   Factor V Leiden ([place] 02/08/2018   Fungal infection of nail 02/08/2018   Gastroenteritis and colitis, viral 02/08/2018   Hypoglycemia 02/08/2018   Menopause 02/08/2018   Other long term (current) drug therapy 02/08/2018   Other osteonecrosis, unspecified femur ([place] 02/08/2018   Pure hypercholesterolemia 02/08/2018   Recurrent major depressive episodes ([place] 02/08/2018   Syncope and collapse 02/08/2018   Moderate persistent asthma 02/08/2018   Cough, persistent 02/08/2018  Restless leg syndrome 01/15/2018   Degeneration of lumbar intervertebral disc 11/21/2017   Tremor of right hand 03/02/2017   Parkinson disease ([place]) 03/02/2017   UTI (urinary tract infection) 12/01/2015   Type 2 diabetes mellitus with stage  3b chronic kidney disease, with long-term current use of insulin ([place]) 06/03/2015   Fast heart beat 04/09/2015   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 2 diabetes mellitus ([place]) 04/24/2014   Postoperative anemia due to acute blood loss 10/10/2013   Avascular necrosis of bone of left hip ([place]) 10/09/2013   Arthritis of pelvic region, degenerative 10/09/2013   Breath shortness 06/18/2013   Diabetes mellitus type 2, uncontrolled ([place]) 05/31/2013   Intermittent claudication ([place]) 04/04/2013   Anxiety    Gastroesophageal reflux disease 09/12/2012   Clinical depression 02/21/2012   Stroke ([place]) 02/11/2012   History of revision of total replacement of right hip joint 04/04/2009    Artist Pais, PTA 10/15/2020, 6:27 PM  [facility] [address]  [place], [place], [address] Phone: [phone]   Fax:  [phone]  Name: Deanna Rowe MRN: [id number] Date of Birth: 1955-09-14

## 2020-10-16 DIAGNOSIS — R310 Gross hematuria: Secondary | ICD-10-CM | POA: Diagnosis not present

## 2020-10-16 DIAGNOSIS — N3941 Urge incontinence: Secondary | ICD-10-CM | POA: Diagnosis not present

## 2020-10-19 ENCOUNTER — Ambulatory Visit: Payer: 59 | Attending: Internal Medicine

## 2020-10-19 DIAGNOSIS — Z23 Encounter for immunization: Secondary | ICD-10-CM

## 2020-10-19 NOTE — Progress Notes (Signed)
   Covid-19 Vaccination Clinic  Name:  Deanna Rowe    MRN: [id number] DOB: 30-Apr-1955  10/19/2020  [name] was observed post Covid-19 immunization for 15 minutes without incident. She was provided with Vaccine Information Sheet and instruction to access the V-Safe system.   [name] was instructed to call 911 with any severe reactions post vaccine: Difficulty breathing  Swelling of face and throat  A fast heartbeat  A bad rash all over body  Dizziness and weakness   Immunizations Administered     Name Date Dose VIS Date Route   PFIZER Comrnaty(Gray TOP) Covid-19 Vaccine 10/19/2020  2:58 PM 0.3 mL 03/12/2020 Intramuscular   Manufacturer: [employer]   Lot: [id number]   [place]: [phone]

## 2020-10-21 ENCOUNTER — Other Ambulatory Visit: Payer: Self-pay

## 2020-10-21 ENCOUNTER — Ambulatory Visit: Payer: 59

## 2020-10-21 DIAGNOSIS — M25551 Pain in right hip: Secondary | ICD-10-CM | POA: Diagnosis not present

## 2020-10-21 DIAGNOSIS — R262 Difficulty in walking, not elsewhere classified: Secondary | ICD-10-CM

## 2020-10-21 DIAGNOSIS — M25651 Stiffness of right hip, not elsewhere classified: Secondary | ICD-10-CM | POA: Diagnosis not present

## 2020-10-21 DIAGNOSIS — R2681 Unsteadiness on feet: Secondary | ICD-10-CM | POA: Diagnosis not present

## 2020-10-21 DIAGNOSIS — M6281 Muscle weakness (generalized): Secondary | ICD-10-CM | POA: Diagnosis not present

## 2020-10-21 NOTE — Therapy (Signed)
[facility] [address]  [place], [place], [address] Phone: [phone]   Fax:  [phone]  Physical Therapy Treatment  Patient Details  Name: Deanna Rowe MRN: [id number] Date of Birth: October 30, 1955 Referring Provider (PT): Rod Can, MD   Encounter Date: 10/21/2020   PT End of Session - 10/21/20 1720     Visit Number 23    Number of Visits 24    Date for PT Re-Evaluation 11/05/20    Authorization Type [facility]    PT Start Time 1624    PT Stop Time 1708    PT Time Calculation (min) 44 min    Activity Tolerance Patient tolerated treatment well    Behavior During Therapy [facility] for tasks assessed/performed             Past Medical History:  Diagnosis Date   Anemia    Anxiety    Arthritis    L HIP   Asthma    Avascular necrosis of hip ([place])    LEFT   Back pain    Chest pain    Constipation    Depression    Diabetes mellitus    Diabetes mellitus, type II ([place])    Dry cough    Edema, lower extremity    Gait abnormality 11/18/2019   GERD (gastroesophageal reflux disease)    High cholesterol    Hypertension    Insomnia    takes Ambien nightly   Joint pain    Kidney disease    Kidney disease    Obesity    Parkinsonism ([place]) 03/02/2017   PONV (postoperative nausea and vomiting)    RLS (restless legs syndrome) 01/15/2018   Sleep apnea    Swallowing difficulty    Thyroid disease    TIA (transient ischemic attack) 2012   no residual problems   Urgency incontinence    Vitamin D deficiency     Past Surgical History:  Procedure Laterality Date   DILATION AND CURETTAGE OF UTERUS     ESOPHAGEAL MANOMETRY N/A 09/03/2012   Procedure: ESOPHAGEAL MANOMETRY (EM);  Surgeon: Garlan Fair, MD;  [facility];  Service: Endoscopy;  Laterality: N/A;   ESOPHAGEAL MANOMETRY N/A 06/19/2017   Procedure: ESOPHAGEAL MANOMETRY (EM);  Surgeon: Clarene Essex, MD;  [facility];  Service:  Endoscopy;  Laterality: N/A;   EXPLORATORY LAPAROTOMY     GANGLION CYST EXCISION     JOINT REPLACEMENT  2011   rt total hip   KNEE ARTHROSCOPY     PILONIDAL CYST / SINUS EXCISION     TOTAL HIP ARTHROPLASTY Left 10/09/2013   Procedure: LEFT TOTAL HIP ARTHROPLASTY ANTERIOR APPROACH;  Surgeon: [name], MD;  [facility];  Service: Orthopedics;  Laterality: Left;    There were no vitals filed for this visit.   Subjective Assessment - 10/21/20 1627     Subjective Pt reports having a good HEP and not having much hip pain today.    Pertinent History TIA, thyroid disease, Parkinsonism, kidney disease, HTN, HLD, GERD, DMII, depression, L hip avascular necrosis, anxiety, anemia, R THA 2011, L THA 2015, knee arthroscopy    Diagnostic tests per patient- R hip xray was clear    Patient Stated Goals decrease pain    Currently in Pain? No/denies  [facility] Adult PT Treatment/Exercise - 10/21/20 0001       Ambulation/Gait   Stairs Assistance 5: Supervision;6: Modified independent (Device/Increase time)    Number of Stairs 13    Height of Stairs 8      High Level Balance   High Level Balance Activities Negotiating over obstacles    High Level Balance Comments 3 trials long the counter      Knee/Hip Exercises: Aerobic   Recumbent Bike L3x13mn      Knee/Hip Exercises: Standing   Forward Step Up Both;10 reps;Hand Hold: 1;Step Height: 6"                 Balance Exercises - 10/21/20 0001       Balance Exercises: Standing   Standing Eyes Opened Narrow base of support (BOS);Foam/compliant surface;10 secs;3 reps   on bosu ball   Other Standing Exercises walking on balance disk 2x along the length of the counter fwd and lateral    Other Standing Exercises Comments WS on bosu ball 4 way 10 reps each                 PT Short Term Goals - 10/08/20 1819       PT SHORT TERM GOAL #1   Title Patient to be independent with  initial HEP.    Status Achieved   07/30/20   Target Date 07/20/20               PT Long Term Goals - 10/08/20 1635       PT LONG TERM GOAL #1   Title Patient to be independent with advanced HEP.    Time 4    Period Weeks    Status Partially Met   met for current   Target Date 11/05/20      PT LONG TERM GOAL #2   Title Patient to demonstrate R hip AROM WFL and without pain limiting.    Time 4    Period Weeks    Status Partially Met   improved in R hip flexion and IR; pain still persists with ER and flexion   Target Date 11/05/20      PT LONG TERM GOAL #3   Title Patient to demonstrate R LE strength >/=4+/5.    Time 4    Period Weeks    Status Partially Met   R hip flexors limited   Target Date 11/05/20      PT LONG TERM GOAL #4   Title Patient to report tolerance of 1 hour on her feet without pain limiting.    Time 4    Period Weeks    Status Partially Met   45 min until she has to sit; 20 min it starts to hurt   Target Date 11/05/20      PT LONG TERM GOAL #5   Title Patient to score 48/56 on Berg in order to decrease risk of falls.    Time 6    Period Weeks    Status Achieved   10/08/20:Merrilee Jansky= 48/56                  Plan - 10/21/20 1723     Clinical Impression Statement Pt reported no new complaints since the last session. She has been doing well with no c/o R hip pain. Pt had occasional LOB with stepping over higher obstacles and WS on bosu. CGA was required for her to maintain stability with the bosu ball. She required  a few seated breaks in between exercises for recovery. Cues required for posture and for adequate WS of the LEs. We practiced stairs today and she demonstrated good stability with one rail. I encouraged her to use stairs at home with one rail but have cane ready for use when finished with stairs. She responded well and may be ready to wrap up next session.    Personal Factors and Comorbidities Age;Comorbidity 3+;Fitness;Past/Current  Experience;Time since onset of injury/illness/exacerbation    Comorbidities TIA, thyroid disease, Parkinsonism, kidney disease, HTN, HLD, GERD, DMII, depression, L hip avascular necrosis, anxiety, anemia, R THA 2011, L THA 2015, knee arthroscopy    PT Frequency 1x / week    PT Duration 4 weeks    PT Treatment/Interventions ADLs/Self Care Home Management;Cryotherapy;Electrical Stimulation;Iontophoresis 41m/ml Dexamethasone;Moist Heat;Balance training;Therapeutic exercise;Therapeutic activities;Functional mobility training;Stair training;Gait training;Ultrasound;Neuromuscular re-education;Patient/family education;Manual techniques;Taping;Energy conservation;Dry needling;Passive range of motion;Scar mobilization    PT Next Visit Plan progress R hip AROM and R LE strengthening to tolerance; STS transfers, balance training with narrow BOS & rotation; review seated & standing PWR! moves & progress to other positions as indicated    Consulted and Agree with Plan of Care Patient             Patient will benefit from skilled therapeutic intervention in order to improve the following deficits and impairments:  Abnormal gait, Decreased activity tolerance, Decreased strength, Increased fascial restricitons, Pain, Decreased balance, Difficulty walking, Improper body mechanics, Decreased range of motion, Decreased safety awareness, Postural dysfunction, Impaired flexibility  Visit Diagnosis: Pain in right hip  Stiffness of right hip, not elsewhere classified  Muscle weakness (generalized)  Difficulty in walking, not elsewhere classified  Unsteadiness on feet     Problem List Patient Active Problem List   Diagnosis Date Noted   Allergic conjunctivitis of both eyes 07/06/2020   Gait abnormality 11/18/2019   OSA on CPAP 06/04/2019   Pain in joint of left shoulder 03/28/2019   Seasonal allergic conjunctivitis 02/07/2019   Chronic intermittent hypoxia with obstructive sleep apnea 01/25/2019    Severe obstructive sleep apnea 01/25/2019   Excessive daytime sleepiness 01/03/2019   Non-restorative sleep 01/03/2019   Nocturia more than twice per night 01/03/2019   Weight gain, abnormal 01/03/2019   Snorings 01/03/2019   Trochanteric bursitis of right hip 04/27/2018   Encounter for routine screening for malformation using ultrasonics 02/08/2018   Abnormal weight gain 02/08/2018   Acid indigestion 02/08/2018   Acute antritis 02/08/2018   Class 2 severe obesity with serious comorbidity and body mass index (BMI) of 39.0 to 39.9 in adult ([place] 02/08/2018   Perennial allergic rhinitis 02/08/2018   Antiphospholipid syndrome ([place] 02/08/2018   Arthralgia of hip or thigh 02/08/2018   Asthma, cough variant 02/08/2018   Vitamin D deficiency 02/08/2018   Backhand tennis elbow 02/08/2018   Cannot sleep 02/08/2018   Chronic kidney disease (CKD), stage III (moderate) ([place] 02/08/2018   Current drug use 02/08/2018   Depression, major, single episode, in partial remission ([place] 02/08/2018   Diabetes mellitus with polyneuropathy ([place] 02/08/2018   Difficulty hearing 02/08/2018   Disturbance of skin sensation 02/08/2018   Hypertension associated with diabetes ([place] 02/08/2018   Extremity pain 02/08/2018   Facet syndrome, lumbar 02/08/2018   Factor V Leiden ([place] 02/08/2018   Fungal infection of nail 02/08/2018   Gastroenteritis and colitis, viral 02/08/2018   Hypoglycemia 02/08/2018   Menopause 02/08/2018   Other long term (current) drug therapy 02/08/2018   Other osteonecrosis, unspecified femur ([place]  02/08/2018   Pure hypercholesterolemia 02/08/2018   Recurrent major depressive episodes ([place]) 02/08/2018   Syncope and collapse 02/08/2018   Moderate persistent asthma 02/08/2018   Cough, persistent 02/08/2018   Restless leg syndrome 01/15/2018   Degeneration of lumbar intervertebral disc 11/21/2017   Tremor of right hand 03/02/2017   Parkinson disease ([place]) 03/02/2017   UTI (urinary  tract infection) 12/01/2015   Type 2 diabetes mellitus with stage 3b chronic kidney disease, with long-term current use of insulin ([place]) 06/03/2015   Fast heart beat 04/09/2015   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 2 diabetes mellitus ([place]) 04/24/2014   Postoperative anemia due to acute blood loss 10/10/2013   Avascular necrosis of bone of left hip ([name]) 10/09/2013   Arthritis of pelvic region, degenerative 10/09/2013   Breath shortness 06/18/2013   Diabetes mellitus type 2, uncontrolled ([place]) 05/31/2013   Intermittent claudication ([place]) 04/04/2013   Anxiety    Gastroesophageal reflux disease 09/12/2012   Clinical depression 02/21/2012   Stroke ([place]) 02/11/2012   History of revision of total replacement of right hip joint 04/04/2009    Artist Pais, PTA 10/21/2020, 6:05 PM  [facility] [address]  [place], [place], [address] Phone: [phone]   Fax:  [phone]  Name: Deanna Rowe MRN: [id number] Date of Birth: [birth year]

## 2020-10-22 DIAGNOSIS — G4733 Obstructive sleep apnea (adult) (pediatric): Secondary | ICD-10-CM | POA: Diagnosis not present

## 2020-10-23 ENCOUNTER — Other Ambulatory Visit (HOSPITAL_BASED_OUTPATIENT_CLINIC_OR_DEPARTMENT_OTHER): Payer: Self-pay

## 2020-10-23 MED ORDER — COVID-19 MRNA VAC-TRIS(PFIZER) 30 MCG/0.3ML IM SUSP
INTRAMUSCULAR | 0 refills | Status: DC
  Filled 2020-10-23: qty 0.3, 1d supply, fill #0

## 2020-10-28 ENCOUNTER — Ambulatory Visit: Payer: 59

## 2020-10-28 ENCOUNTER — Other Ambulatory Visit: Payer: Self-pay

## 2020-10-28 DIAGNOSIS — M25551 Pain in right hip: Secondary | ICD-10-CM | POA: Diagnosis not present

## 2020-10-28 DIAGNOSIS — R2681 Unsteadiness on feet: Secondary | ICD-10-CM | POA: Diagnosis not present

## 2020-10-28 DIAGNOSIS — M6281 Muscle weakness (generalized): Secondary | ICD-10-CM

## 2020-10-28 DIAGNOSIS — R262 Difficulty in walking, not elsewhere classified: Secondary | ICD-10-CM

## 2020-10-28 DIAGNOSIS — M25651 Stiffness of right hip, not elsewhere classified: Secondary | ICD-10-CM

## 2020-10-28 NOTE — Therapy (Addendum)
[facility] [place] [address]  [address] [place], [place], [address] Phone: [phone]   Fax:  [phone]  Physical Therapy Discharge Summary  Patient Details  Name: Deanna Rowe MRN: [id number] Date of Birth: 06/26/1955 Referring Provider (PT): Brian Swinteck, MD   Encounter Date: 10/28/2020   PT End of Session - 10/28/20 1707     Visit Number 24    Number of Visits 24    Date for PT Re-Evaluation 11/05/20    Authorization Type [facility]    PT Start Time 1620    PT Stop Time 1701    PT Time Calculation (min) 41 min    Activity Tolerance Patient tolerated treatment well    Behavior During Therapy WFL for tasks assessed/performed             Past Medical History:  Diagnosis Date   Anemia    Anxiety    Arthritis    L HIP   Asthma    Avascular necrosis of hip (HCC)    LEFT   Back pain    Chest pain    Constipation    Depression    Diabetes mellitus    Diabetes mellitus, type II (HCC)    Dry cough    Edema, lower extremity    Gait abnormality 11/18/2019   GERD (gastroesophageal reflux disease)    High cholesterol    Hypertension    Insomnia    takes Ambien nightly   Joint pain    Kidney disease    Kidney disease    Obesity    Parkinsonism (HCC) 03/02/2017   PONV (postoperative nausea and vomiting)    RLS (restless legs syndrome) 01/15/2018   Sleep apnea    Swallowing difficulty    Thyroid disease    TIA (transient ischemic attack) 2012   no residual problems   Urgency incontinence    Vitamin D deficiency     Past Surgical History:  Procedure Laterality Date   DILATION AND CURETTAGE OF UTERUS     ESOPHAGEAL MANOMETRY N/A 09/03/2012   Procedure: ESOPHAGEAL MANOMETRY (EM);  Surgeon: Martin K Johnson, MD;  Location: [facility];  Service: Endoscopy;  Laterality: N/A;   ESOPHAGEAL MANOMETRY N/A 06/19/2017   Procedure: ESOPHAGEAL MANOMETRY (EM);  Surgeon: Magod, Marc, MD;  Location: [facility];   Service: Endoscopy;  Laterality: N/A;   EXPLORATORY LAPAROTOMY     GANGLION CYST EXCISION     JOINT REPLACEMENT  2011   rt total hip   KNEE ARTHROSCOPY     PILONIDAL CYST / SINUS EXCISION     TOTAL HIP ARTHROPLASTY Left 10/09/2013   Procedure: LEFT TOTAL HIP ARTHROPLASTY ANTERIOR APPROACH;  Surgeon: Frank Aluisio V, MD;  Location: [facility];  Service: Orthopedics;  Laterality: Left;    There were no vitals filed for this visit.   Subjective Assessment - 10/28/20 1623     Subjective Pt reports that she feels she can wrap up today.    Pertinent History TIA, thyroid disease, Parkinsonism, kidney disease, HTN, HLD, GERD, DMII, depression, L hip avascular necrosis, anxiety, anemia, R THA 2011, L THA 2015, knee arthroscopy    Diagnostic tests per patient- R hip xray was clear    Patient Stated Goals decrease pain    Currently in Pain? No/denies                [facility] PT Assessment - 10/28/20 0001       Assessment   Medical   Diagnosis Contusion of R hip    Referring Provider (PT) Brian Swinteck, MD    Onset Date/Surgical Date 06/15/20      AROM   Right Hip Flexion 109    Right Hip External Rotation  30    Right Hip Internal Rotation  38      Strength   Right Hip Flexion 4+/5    Right Hip Extension 4+/5    Right Hip ABduction 4+/5    Right Hip ADduction 4+/5    Right Knee Flexion 5/5    Right Knee Extension 5/5                           [facility] Adult PT Treatment/Exercise - 10/28/20 0001       Knee/Hip Exercises: Aerobic   Recumbent Bike L3x6min                    PT Education - 10/28/20 1655     Education Details discussion on progress, HEP update and review.    Person(s) Educated Patient    Methods Explanation;Demonstration;Verbal cues;Handout    Comprehension Verbalized understanding;Returned demonstration              PT Short Term Goals - 10/08/20 1819       PT SHORT TERM GOAL #1   Title Patient to be independent with initial  HEP.    Status Achieved   07/30/20   Target Date 07/20/20               PT Long Term Goals - 10/28/20 1632       PT LONG TERM GOAL #1   Title Patient to be independent with advanced HEP.    Time 4    Period Weeks    Status Partially Met   met for current     PT LONG TERM GOAL #2   Title Patient to demonstrate R hip AROM WFL and without pain limiting.    Time 4    Period Weeks    Status Achieved   WFL no limitation from pain     PT LONG TERM GOAL #3   Title Patient to demonstrate R LE strength >/=4+/5.    Time 4    Period Weeks    Status Achieved      PT LONG TERM GOAL #4   Title Patient to report tolerance of 1 hour on her feet without pain limiting.    Time 4    Period Weeks    Status Partially Met   45 min until she has to sit; 35 min it starts to hurt     PT LONG TERM GOAL #5   Title Patient to score 48/56 on Berg in order to decrease risk of falls.    Time 6    Period Weeks    Status Achieved   10/08/20: Berg = 48/56                  Plan - 10/28/20 1708     Clinical Impression Statement Makaiya has met her goals related to R hip AROM and strength. Her biggest limitation is being limited by LE fatigue after ~45 min of standing on her feet. Pt reports no falls recently and is pleased with her progress. We reviewed her HEP to ensure her understanding, she was advised to do all standing ther ex with counter support as needed. She declines any increases in hip pain or spells   of dizziness. Due to her improvement she is being D/C as of today.    Personal Factors and Comorbidities Age;Comorbidity 3+;Fitness;Past/Current Experience;Time since onset of injury/illness/exacerbation    Comorbidities TIA, thyroid disease, Parkinsonism, kidney disease, HTN, HLD, GERD, DMII, depression, L hip avascular necrosis, anxiety, anemia, R THA 2011, L THA 2015, knee arthroscopy    PT Frequency 1x / week    PT Duration 4 weeks    PT Treatment/Interventions ADLs/Self Care Home  Management;Cryotherapy;Electrical Stimulation;Iontophoresis 4mg/ml Dexamethasone;Moist Heat;Balance training;Therapeutic exercise;Therapeutic activities;Functional mobility training;Stair training;Gait training;Ultrasound;Neuromuscular re-education;Patient/family education;Manual techniques;Taping;Energy conservation;Dry needling;Passive range of motion;Scar mobilization    PT Next Visit Plan D/C; transition to HEP    Consulted and Agree with Plan of Care Patient             Patient will benefit from skilled therapeutic intervention in order to improve the following deficits and impairments:  Abnormal gait, Decreased activity tolerance, Decreased strength, Increased fascial restricitons, Pain, Decreased balance, Difficulty walking, Improper body mechanics, Decreased range of motion, Decreased safety awareness, Postural dysfunction, Impaired flexibility  Visit Diagnosis: Pain in right hip  Stiffness of right hip, not elsewhere classified  Muscle weakness (generalized)  Difficulty in walking, not elsewhere classified  Unsteadiness on feet     Problem List Patient Active Problem List   Diagnosis Date Noted   Allergic conjunctivitis of both eyes 07/06/2020   Gait abnormality 11/18/2019   OSA on CPAP 06/04/2019   Pain in joint of left shoulder 03/28/2019   Seasonal allergic conjunctivitis 02/07/2019   Chronic intermittent hypoxia with obstructive sleep apnea 01/25/2019   Severe obstructive sleep apnea 01/25/2019   Excessive daytime sleepiness 01/03/2019   Non-restorative sleep 01/03/2019   Nocturia more than twice per night 01/03/2019   Weight gain, abnormal 01/03/2019   Snorings 01/03/2019   Trochanteric bursitis of right hip 04/27/2018   Encounter for routine screening for malformation using ultrasonics 02/08/2018   Abnormal weight gain 02/08/2018   Acid indigestion 02/08/2018   Acute antritis 02/08/2018   Class 2 severe obesity with serious comorbidity and body mass index  (BMI) of 39.0 to 39.9 in adult (HCC) 02/08/2018   Perennial allergic rhinitis 02/08/2018   Antiphospholipid syndrome (HCC) 02/08/2018   Arthralgia of hip or thigh 02/08/2018   Asthma, cough variant 02/08/2018   Vitamin D deficiency 02/08/2018   Backhand tennis elbow 02/08/2018   Cannot sleep 02/08/2018   Chronic kidney disease (CKD), stage III (moderate) (HCC) 02/08/2018   Current drug use 02/08/2018   Depression, major, single episode, in partial remission (HCC) 02/08/2018   Diabetes mellitus with polyneuropathy (HCC) 02/08/2018   Difficulty hearing 02/08/2018   Disturbance of skin sensation 02/08/2018   Hypertension associated with diabetes (HCC) 02/08/2018   Extremity pain 02/08/2018   Facet syndrome, lumbar 02/08/2018   Factor V Leiden (HCC) 02/08/2018   Fungal infection of nail 02/08/2018   Gastroenteritis and colitis, viral 02/08/2018   Hypoglycemia 02/08/2018   Menopause 02/08/2018   Other long term (current) drug therapy 02/08/2018   Other osteonecrosis, unspecified femur (HCC) 02/08/2018   Pure hypercholesterolemia 02/08/2018   Recurrent major depressive episodes (HCC) 02/08/2018   Syncope and collapse 02/08/2018   Moderate persistent asthma 02/08/2018   Cough, persistent 02/08/2018   Restless leg syndrome 01/15/2018   Degeneration of lumbar intervertebral disc 11/21/2017   Tremor of right hand 03/02/2017   Parkinson disease (HCC) 03/02/2017   UTI (urinary tract infection) 12/01/2015   Type 2 diabetes mellitus with stage 3b chronic kidney disease, with long-term current   use of insulin ([place]) 06/03/2015   Fast heart beat 04/09/2015   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 2 diabetes mellitus ([place]) 04/24/2014   Postoperative anemia due to acute blood loss 10/10/2013   Avascular necrosis of bone of left hip ([place]) 10/09/2013   Arthritis of pelvic region, degenerative 10/09/2013   Breath shortness 06/18/2013   Diabetes mellitus type 2, uncontrolled ([place]) 05/31/2013   Intermittent  claudication ([place]) 04/04/2013   Anxiety    Gastroesophageal reflux disease 09/12/2012   Clinical depression 02/21/2012   Stroke ([place]) 02/11/2012   History of revision of total replacement of right hip joint 04/04/2009    Artist Pais, PTA 10/28/2020, 5:34 PM  [facility] [place] [address]  [place], [place], [address] Phone: [phone]   Fax:  [phone]  Name: KATALEIA QUARANTA MRN: [id number] Date of Birth: 10-22-1955   PHYSICAL THERAPY DISCHARGE SUMMARY  Visits from Start of Care: 24  Current functional level related to goals / functional outcomes: See above clinical impression- patient has met or partially met all goals and is pleased with CLOF   Remaining deficits: Limited standing tolerance   Education / Equipment: HEP  Plan: Patient agrees to discharge.  Patient goals were partially met. Patient is being discharged due to meeting the stated rehab goals.      Janene Harvey, PT, DPT 10/29/20 8:22 AM

## 2020-10-28 NOTE — Patient Instructions (Signed)
Access Code: [email or web address] URL: [email or web address] Date: 10/28/2020 Prepared by: Clarene Essex  Exercises Seated Piriformis Stretch with Trunk Bend - 2 x daily - 7 x weekly - 2 sets - 2 reps - 30 seconds hold Sit to Stand Without Arm Support - 2 x daily - 7 x weekly - 2 sets - 10 reps Standing Hip Abduction with Counter Support - 2 x daily - 7 x weekly - 2 sets - 10 reps Standing March with Counter Support - 2 x daily - 7 x weekly - 2 sets - 10 reps Standing Hip Extension with Counter Support - 2 x daily - 7 x weekly - 2 sets - 10 reps Sidelying TFL Stretch - 2 x daily - 7 x weekly - 2 sets - 2 reps - 30 sec hold

## 2020-10-29 ENCOUNTER — Other Ambulatory Visit (HOSPITAL_BASED_OUTPATIENT_CLINIC_OR_DEPARTMENT_OTHER): Payer: Self-pay

## 2020-10-29 ENCOUNTER — Ambulatory Visit (INDEPENDENT_AMBULATORY_CARE_PROVIDER_SITE_OTHER): Payer: 59 | Admitting: Family Medicine

## 2020-10-29 ENCOUNTER — Encounter (INDEPENDENT_AMBULATORY_CARE_PROVIDER_SITE_OTHER): Payer: Self-pay | Admitting: Family Medicine

## 2020-10-29 VITALS — BP 99/62 | HR 73 | Temp 97.8°F | Ht 66.0 in | Wt 210.0 lb

## 2020-10-29 DIAGNOSIS — Z6839 Body mass index (BMI) 39.0-39.9, adult: Secondary | ICD-10-CM

## 2020-10-29 DIAGNOSIS — I152 Hypertension secondary to endocrine disorders: Secondary | ICD-10-CM | POA: Diagnosis not present

## 2020-10-29 DIAGNOSIS — E1159 Type 2 diabetes mellitus with other circulatory complications: Secondary | ICD-10-CM

## 2020-10-29 DIAGNOSIS — N1832 Chronic kidney disease, stage 3b: Secondary | ICD-10-CM

## 2020-10-29 DIAGNOSIS — Z794 Long term (current) use of insulin: Secondary | ICD-10-CM | POA: Diagnosis not present

## 2020-10-29 DIAGNOSIS — E1122 Type 2 diabetes mellitus with diabetic chronic kidney disease: Secondary | ICD-10-CM

## 2020-10-29 MED FILL — Solifenacin Succinate Tab 10 MG: ORAL | 30 days supply | Qty: 30 | Fill #4 | Status: AC

## 2020-11-02 ENCOUNTER — Other Ambulatory Visit (HOSPITAL_BASED_OUTPATIENT_CLINIC_OR_DEPARTMENT_OTHER): Payer: Self-pay

## 2020-11-02 ENCOUNTER — Other Ambulatory Visit (HOSPITAL_COMMUNITY): Payer: Self-pay

## 2020-11-03 NOTE — Progress Notes (Signed)
Chief Complaint:   OBESITY Deanna Rowe is here to discuss her progress with her obesity treatment plan along with follow-up of her obesity related diagnoses. Deanna Rowe is on the Category 3 Plan and states she is following her eating plan approximately 50% of the time. Deanna Rowe states she is doing physcial therapy for 45 minutes 2 times per week.  Today's visit was #: 28 Starting weight: 237 lbs Starting date: 04/16/2019 Today's weight: 210 lbs Today's date: 10/29/2020 Total lbs lost to date: 27 Total lbs lost since last in-office visit: 0  Interim History: Deanna Rowe says she is having only a banana at breakfast, and lunch she has a sandwich. Dinner is chicken or fish, green beans, and 1/2 potato with butter.  Subjective:   1. Type 2 diabetes mellitus with stage 3b chronic kidney disease, with long-term current use of insulin ([place]) Cassadi's diabetes is well controlled. Last A1c was 7.0. She denies hypoglycemia. She is on Basaglar 10 units nightly, Humalog TID at 14 units, 12 units, and 16 units with meals.  She is also on Ozempic 0.25 mg weekly. She sees Dr. Buddy Duty for management of her diabetes mellitus, and she will see him on August 15th 2022. Deanna Rowe's fasting CBGs range between 70-80's (she feels ok- not symptomatic for hypolycemia). Her CBGs after meals range between 140-150.  Lab Results  Component Value Date   HGBA1C 7.9 (H) 04/16/2019   HGBA1C 6.9 11/25/2016   HGBA1C 6.0 06/10/2016   Lab Results  Component Value Date   MICROALBUR <0.7 02/12/2016   LDLCALC 57 04/16/2019   CREATININE 1.43 (H) 03/02/2020   Lab Results  Component Value Date   INSULIN 12.3 04/16/2019   2. Hypertension associated with diabetes ([place]) Deanna Rowe's blood pressure at home runs between 140-150's/70-80's. She brought her blood pressure cuff in and it registered high. It is a wrist cuff. She is on Norvasc, losartan-hydrochlorothiazide, and Lasix. She denies orthostatic sx.  BP Readings from Last 3  Encounters:  10/29/20 99/62  10/06/20 105/71  10/01/20 (!) 94/55   Lab Results  Component Value Date   CREATININE 1.43 (H) 03/02/2020   CREATININE 1.10 (H) 04/16/2019   CREATININE 1.58 (H) 10/12/2018   Assessment/Plan:   1. Type 2 diabetes mellitus with stage 3b chronic kidney disease, with long-term current use of insulin ([place]) Deanna Rowe will ask D. Buddy Duty about increasing Ozempic dose to 0.5 mg weekly.   2. Hypertension associated with diabetes ([place]) Deanna Rowe will continue all her medications, and she will try to get an upper arm blood pressure cuff for her. She has a wrist cuff currently. She will continue working on healthy weight loss and exercise to improve blood pressure control. We will watch for signs of hypotension as she continues her lifestyle modifications.  3. Obesity: BMI 33.91 Deanna Rowe is currently in the action stage of change. As such, her goal is to continue with weight loss efforts. She has agreed to practicing portion control and making smarter food choices, such as increasing vegetables and decreasing simple carbohydrates.   Discussed breakfast ideas: protein oatmeal with Fairlife milk, and protein shake.  Exercise goals: As is.  Behavioral modification strategies: increasing lean protein intake, no skipping meals, and meal planning and cooking strategies.  Deanna Rowe has agreed to follow-up with our clinic in 3 to 4 weeks.  Objective:   Blood pressure 99/62, pulse 73, temperature 97.8 F (36.6 C), height '5\' 6"'$  (1.676 m), weight 210 lb (95.3 kg), SpO2 97 %. Body mass index is 33.89 kg/m.  General: Cooperative, alert, well developed, in no acute distress. HEENT: Conjunctivae and lids unremarkable. Cardiovascular: Regular rhythm.  Lungs: Normal work of breathing. Neurologic: No focal deficits.   Lab Results  Component Value Date   CREATININE 1.43 (H) 03/02/2020   BUN 13 03/02/2020   NA 136 03/02/2020   K 4.7 03/02/2020   CL 98 03/02/2020   CO2 27 03/02/2020    Lab Results  Component Value Date   ALT 21 04/16/2019   AST 23 04/16/2019   ALKPHOS 58 04/16/2019   BILITOT 0.3 04/16/2019   Lab Results  Component Value Date   HGBA1C 7.9 (H) 04/16/2019   HGBA1C 6.9 11/25/2016   HGBA1C 6.0 06/10/2016   HGBA1C 5.9 02/12/2016   HGBA1C 6.1 12/01/2015   Lab Results  Component Value Date   INSULIN 12.3 04/16/2019   Lab Results  Component Value Date   TSH 1.780 04/16/2019   Lab Results  Component Value Date   CHOL 155 04/16/2019   HDL 84 04/16/2019   LDLCALC 57 04/16/2019   TRIG 71 04/16/2019   Lab Results  Component Value Date   VD25OH 44.8 12/17/2019   VD25OH 40.9 04/16/2019   Lab Results  Component Value Date   WBC 6.8 04/16/2019   HGB 12.3 04/16/2019   HCT 36.1 04/16/2019   MCV 96 04/16/2019   PLT 274 04/16/2019   No results found for: IRON, TIBC, FERRITIN  Attestation Statements:   Reviewed by clinician on day of visit: allergies, medications, problem list, medical history, surgical history, family history, social history, and previous encounter notes.  Time spent on visit including pre-visit chart review and post-visit care and charting was 34 minutes.    Wilhemena Durie, am acting as Location manager for [employer], FNP-C.  I have reviewed the above documentation for accuracy and completeness, and I agree with the above. -  Georgianne Fick, FNP

## 2020-11-04 ENCOUNTER — Encounter: Payer: 59 | Admitting: Physical Therapy

## 2020-11-04 ENCOUNTER — Encounter (INDEPENDENT_AMBULATORY_CARE_PROVIDER_SITE_OTHER): Payer: Self-pay | Admitting: Family Medicine

## 2020-11-05 ENCOUNTER — Encounter: Payer: 59 | Admitting: Physical Therapy

## 2020-11-05 ENCOUNTER — Ambulatory Visit: Payer: 59 | Admitting: Family

## 2020-11-06 ENCOUNTER — Other Ambulatory Visit (HOSPITAL_COMMUNITY): Payer: Self-pay

## 2020-11-09 ENCOUNTER — Other Ambulatory Visit (HOSPITAL_COMMUNITY): Payer: Self-pay

## 2020-11-11 ENCOUNTER — Other Ambulatory Visit (HOSPITAL_BASED_OUTPATIENT_CLINIC_OR_DEPARTMENT_OTHER): Payer: Self-pay

## 2020-11-11 MED ORDER — OZEMPIC (0.25 OR 0.5 MG/DOSE) 2 MG/1.5ML ~~LOC~~ SOPN
PEN_INJECTOR | SUBCUTANEOUS | 11 refills | Status: DC
  Filled 2020-11-11: qty 3, 56d supply, fill #0

## 2020-11-18 ENCOUNTER — Other Ambulatory Visit (HOSPITAL_COMMUNITY): Payer: Self-pay

## 2020-11-18 ENCOUNTER — Encounter (INDEPENDENT_AMBULATORY_CARE_PROVIDER_SITE_OTHER): Payer: Self-pay | Admitting: Family Medicine

## 2020-11-18 ENCOUNTER — Ambulatory Visit (INDEPENDENT_AMBULATORY_CARE_PROVIDER_SITE_OTHER): Payer: 59 | Admitting: Family Medicine

## 2020-11-18 ENCOUNTER — Other Ambulatory Visit: Payer: Self-pay

## 2020-11-18 VITALS — BP 110/70 | HR 75 | Temp 97.5°F | Ht 66.0 in | Wt 211.0 lb

## 2020-11-18 DIAGNOSIS — Z794 Long term (current) use of insulin: Secondary | ICD-10-CM

## 2020-11-18 DIAGNOSIS — E66812 Obesity, class 2: Secondary | ICD-10-CM

## 2020-11-18 DIAGNOSIS — E1122 Type 2 diabetes mellitus with diabetic chronic kidney disease: Secondary | ICD-10-CM | POA: Diagnosis not present

## 2020-11-18 DIAGNOSIS — Z6839 Body mass index (BMI) 39.0-39.9, adult: Secondary | ICD-10-CM

## 2020-11-18 DIAGNOSIS — N1832 Chronic kidney disease, stage 3b: Secondary | ICD-10-CM | POA: Diagnosis not present

## 2020-11-19 ENCOUNTER — Encounter (INDEPENDENT_AMBULATORY_CARE_PROVIDER_SITE_OTHER): Payer: Self-pay | Admitting: Family Medicine

## 2020-11-19 NOTE — Progress Notes (Signed)
Chief Complaint:   OBESITY Deanna Rowe is here to discuss her progress with her obesity treatment plan along with follow-up of her obesity related diagnoses. Deanna Rowe is on the Category 3 Plan and practicing portion control and making smarter food choices, such as increasing vegetables and decreasing simple carbohydrates and states she is following her eating plan approximately 45% of the time. Deanna Rowe states she is doing Parkinson's  exercise for 45 minutes 2 times per week.  Today's visit was #: 73 Starting weight: 237 lbs Starting date: 04/16/2019 Today's weight: 211 lbs Today's date:11/18/2020 Total lbs lost to date: 26 lbs Total lbs lost since last in-office visit: 0  Interim History: Deanna Rowe is now eating Quaker protein oatmeal with Fair Life milk at breakfast rather than just a banana. She has a sandwich at lunch. She is no longer snacking after dinner. She and her husband eat out 2 times per week. She is upset about gaining 1 lb however bioimpedance reading shows she is up almost 2 lbs of water.  Subjective:   1. Type 2 diabetes mellitus with stage 3b chronic kidney disease, with long-term current use of insulin (HCC) Ameila's last A1C was 7.0 in Feb at Dr. Buddy Duty. She is on Ozempic, Basaglar QD and Humalog TID. Her CBGs range 93-156. She denies hyperglycemia. She will follow up With Dr. Buddy Duty next Thursday.  Lab Results  Component Value Date   HGBA1C 7.9 (H) 04/16/2019   HGBA1C 6.9 11/25/2016   HGBA1C 6.0 06/10/2016   Lab Results  Component Value Date   MICROALBUR <0.7 02/12/2016   LDLCALC 57 04/16/2019   CREATININE 1.43 (H) 03/02/2020   Lab Results  Component Value Date   INSULIN 12.3 04/16/2019    Assessment/Plan:   1. Type 2 diabetes mellitus with stage 3b chronic kidney disease, with long-term current use of insulin (HCC) Deanna Rowe will follow up with Dr. Buddy Duty. She will continue all medications. We will try to obtain labs from Dr. Buddy Duty after her visit.  2.  Obesity: BMI 34.07 Deanna Rowe is currently in the action stage of change. As such, her goal is to continue with weight loss efforts. She has agreed to the Category 3 Plan and practicing portion control and making smarter food choices, such as increasing vegetables and decreasing simple carbohydrates.   I discussed with Deanna Rowe joining a gym or getting a stationary bike.  Exercise goals:  Deanna Rowe will continue physical therapy 2 times per week.  Behavioral modification strategies: decreasing simple carbohydrates.  Deanna Rowe has agreed to follow-up with our clinic in 3-4 weeks. Objective:   Blood pressure 110/70, pulse 75, temperature (!) 97.5 F (36.4 C), height '5\' 6"'$  (1.676 m), weight 211 lb (95.7 kg), SpO2 96 %. Body mass index is 34.06 kg/m.  General: Cooperative, alert, well developed, in no acute distress. HEENT: Conjunctivae and lids unremarkable. Cardiovascular: Regular rhythm.  Lungs: Normal work of breathing. Neurologic: No focal deficits.   Lab Results  Component Value Date   CREATININE 1.43 (H) 03/02/2020   BUN 13 03/02/2020   NA 136 03/02/2020   K 4.7 03/02/2020   CL 98 03/02/2020   CO2 27 03/02/2020   Lab Results  Component Value Date   ALT 21 04/16/2019   AST 23 04/16/2019   ALKPHOS 58 04/16/2019   BILITOT 0.3 04/16/2019   Lab Results  Component Value Date   HGBA1C 7.9 (H) 04/16/2019   HGBA1C 6.9 11/25/2016   HGBA1C 6.0 06/10/2016   HGBA1C 5.9 02/12/2016   HGBA1C 6.1  12/01/2015   Lab Results  Component Value Date   INSULIN 12.3 04/16/2019   Lab Results  Component Value Date   TSH 1.780 04/16/2019   Lab Results  Component Value Date   CHOL 155 04/16/2019   HDL 84 04/16/2019   LDLCALC 57 04/16/2019   TRIG 71 04/16/2019   Lab Results  Component Value Date   VD25OH 44.8 12/17/2019   VD25OH 40.9 04/16/2019   Lab Results  Component Value Date   WBC 6.8 04/16/2019   HGB 12.3 04/16/2019   HCT 36.1 04/16/2019   MCV 96 04/16/2019   PLT 274  04/16/2019   No results found for: IRON, TIBC, FERRITIN  Attestation Statements:   Reviewed by clinician on day of visit: allergies, medications, problem list, medical history, surgical history, family history, social history, and previous encounter notes.  I, Lizbeth Bark, RMA, am acting as Location manager for [employer], [place].   I have reviewed the above documentation for accuracy and completeness, and I agree with the above. -  Georgianne Fick, FNP

## 2020-11-24 ENCOUNTER — Other Ambulatory Visit (HOSPITAL_BASED_OUTPATIENT_CLINIC_OR_DEPARTMENT_OTHER): Payer: Self-pay

## 2020-11-24 DIAGNOSIS — I700 Atherosclerosis of aorta: Secondary | ICD-10-CM | POA: Diagnosis not present

## 2020-11-24 DIAGNOSIS — E1151 Type 2 diabetes mellitus with diabetic peripheral angiopathy without gangrene: Secondary | ICD-10-CM | POA: Diagnosis not present

## 2020-11-24 DIAGNOSIS — N1832 Chronic kidney disease, stage 3b: Secondary | ICD-10-CM | POA: Diagnosis not present

## 2020-11-24 DIAGNOSIS — E042 Nontoxic multinodular goiter: Secondary | ICD-10-CM | POA: Diagnosis not present

## 2020-11-24 MED ORDER — OZEMPIC (1 MG/DOSE) 4 MG/3ML ~~LOC~~ SOPN
PEN_INJECTOR | SUBCUTANEOUS | 4 refills | Status: DC
  Filled 2020-11-24: qty 9, 84d supply, fill #0
  Filled 2021-02-24: qty 9, 84d supply, fill #1

## 2020-11-24 MED ORDER — LOSARTAN POTASSIUM 100 MG PO TABS
ORAL_TABLET | ORAL | 4 refills | Status: DC
  Filled 2020-11-24: qty 90, 90d supply, fill #0
  Filled 2021-02-11: qty 90, 90d supply, fill #1
  Filled 2021-06-09: qty 90, 90d supply, fill #2
  Filled 2021-09-02: qty 90, 90d supply, fill #3

## 2020-11-26 ENCOUNTER — Other Ambulatory Visit (HOSPITAL_BASED_OUTPATIENT_CLINIC_OR_DEPARTMENT_OTHER): Payer: Self-pay

## 2020-11-26 ENCOUNTER — Ambulatory Visit: Payer: 59 | Admitting: Family

## 2020-11-26 ENCOUNTER — Ambulatory Visit (INDEPENDENT_AMBULATORY_CARE_PROVIDER_SITE_OTHER): Payer: 59 | Admitting: Family Medicine

## 2020-11-26 ENCOUNTER — Encounter: Payer: Self-pay | Admitting: Family Medicine

## 2020-11-26 ENCOUNTER — Other Ambulatory Visit: Payer: Self-pay

## 2020-11-26 VITALS — BP 110/66 | HR 66 | Temp 97.8°F | Resp 16

## 2020-11-26 DIAGNOSIS — H1013 Acute atopic conjunctivitis, bilateral: Secondary | ICD-10-CM

## 2020-11-26 DIAGNOSIS — K219 Gastro-esophageal reflux disease without esophagitis: Secondary | ICD-10-CM

## 2020-11-26 DIAGNOSIS — J3089 Other allergic rhinitis: Secondary | ICD-10-CM

## 2020-11-26 DIAGNOSIS — H1010 Acute atopic conjunctivitis, unspecified eye: Secondary | ICD-10-CM

## 2020-11-26 DIAGNOSIS — J4540 Moderate persistent asthma, uncomplicated: Secondary | ICD-10-CM | POA: Diagnosis not present

## 2020-11-26 MED ORDER — FUROSEMIDE 20 MG PO TABS
ORAL_TABLET | ORAL | 6 refills | Status: DC
  Filled 2020-11-26: qty 15, 30d supply, fill #0

## 2020-11-26 NOTE — Patient Instructions (Addendum)
Reflux Restart Dexilant 60 mg once a day to control reflux. Continue dietary and lifestyle modifications  Recommend follow up with gastroenterologist for further evaluation  Asthma Continue montelukast 10 mg once a day to prevent cough or wheeze Continue Symbicort 160/4.5 mcg-2 puffs twice a day with a spacer to prevent cough or wheeze Continue albuterol 2 puffs every 4 hours as needed for cough or wheeze For asthma flare, add Flovent 220-2 puffs twice a day for 2 weeks or until cough and wheeze free.  Continue Xolair injections 300 mg once every 4 weeks and have access to an epinephrine auto-injector set.  Allergic rhinitis Continue Nasacort 2 sprays in each nostril once a day as needed for stuffy nose Consider saline nasal rinses as needed for nasal symptoms. Use this before any medicated nasal sprays for best result Continue azelastine nasal spray 2 sprays in each nostril twice a day as needed for nasal symptoms Continue Xyzal 5 mg once a day only as needed for a runny nose or itch Continue allergen avoidance measures directed toward dust mites, cat, and dog as listed below  Allergic conjunctivitis Some over the counter eye drops include Pataday one drop in each eye once a day as needed for red, itchy eyes OR Zaditor one drop in each eye twice a day as needed for red itchy eyes.  Please let us know if this treatment plan is not working well for you  Follow up in 2  months or sooner if needed.

## 2020-11-26 NOTE — Progress Notes (Signed)
[address] [place] [address] Dept: [phone]  FOLLOW UP NOTE  Patient ID: Deanna Rowe, female    DOB: 01-01-1956  Age: 65 y.o. MRN: [id number] Date of Office Visit: 11/26/2020  Assessment  Chief Complaint: Asthma  HPI Deanna Rowe is a 65 year old female who presents the clinic for follow-up visit.  Deanna Rowe was last seen in this clinic on 09/03/2020 by Althea Charon, FNP, for evaluation of asthma, allergic rhinitis, allergic conjunctivitis, and reflux.  Deanna Rowe is accompanied by her husband who assists with history.  At today's visit, Deanna Rowe reports that Deanna Rowe has been experiencing a cough producing clear mucus which is worse at night for about 1 month.  Deanna Rowe reports her asthma has been moderately well controlled with occasional shortness of breath occurring at night and with activity, wheeze occurring at nighttime as well as cough producing clear mucus.  Deanna Rowe is not currently using montelukast, Symbicort, or albuterol.  Deanna Rowe does continue Xolair 300 mg injections once a month.  Allergic rhinitis is reported as poorly controlled with symptoms including clear rhinorrhea, nasal congestion, sneezing, and postnasal drainage.  Deanna Rowe is not currently using Nasacort, azelastine, saline, or Xyzol.  Allergic conjunctivitis is reported as poorly controlled with clear drainage occurring frequently for which Deanna Rowe is not using any medical intervention.  Reflux is reported as poorly controlled with heartburn occurring nightly.  Deanna Rowe is not currently taking Dexilant or famotidine.  Deanna Rowe does not eat greasy food and does not notice a difference in reflux control with any of the foods that Deanna Rowe does eat.  Her current medications are listed in the chart. Of note, Deanna Rowe is interested in using as few medications as possible.   Drug Allergies:  Allergies  Allergen Reactions   Fluticasone-Salmeterol Anaphylaxis   Codeine Nausea And Vomiting   Fetzima [Levomilnacipran] Nausea And Vomiting   Nsaids Other (See  Comments)    Upset stomach    Trulicity [Dulaglutide] Nausea And Vomiting    Significant and undesirably rapid (40 lbs) weight loss    Xanax [Alprazolam] Other (See Comments)    disoriented   Amoxicillin Rash    Has patient had a PCN reaction causing immediate rash, facial/tongue/throat swelling, SOB or lightheadedness with hypotension: Yes Has patient had a PCN reaction causing severe rash involving mucus membranes or skin necrosis: No Has patient had a PCN reaction that required hospitalization: No Has patient had a PCN reaction occurring within the last 10 years: No If all of the above answers are "NO", then may proceed with Cephalosporin use.    Pseudoephedrine Hcl Er Palpitations    Physical Exam: BP 110/66   Pulse 66   Temp 97.8 F (36.6 C) (Temporal)   Resp 16   SpO2 98%    Physical Exam Vitals reviewed.  Constitutional:      Appearance: Normal appearance.  HENT:     Head: Normocephalic and atraumatic.     Right Ear: Tympanic membrane normal.     Left Ear: Tympanic membrane normal.     Nose:     Comments: Bilateral nares edematous and pale with clear nasal drainage noted.  Pharynx normal.  Ears normal.  Eyes normal.    Mouth/Throat:     Pharynx: Oropharynx is clear.  Eyes:     Conjunctiva/sclera: Conjunctivae normal.  Cardiovascular:     Rate and Rhythm: Normal rate and regular rhythm.     Heart sounds: Normal heart sounds. No murmur heard. Pulmonary:     Effort: Pulmonary effort is  normal.     Breath sounds: Normal breath sounds.     Comments: Lungs clear to auscultation Musculoskeletal:        General: Normal range of motion.     Cervical back: Normal range of motion and neck supple.  Skin:    General: Skin is warm and dry.  Neurological:     Mental Status: Deanna Rowe is alert and oriented to person, place, and time.  Psychiatric:        Mood and Affect: Mood normal.        Behavior: Behavior normal.        Thought Content: Thought content normal.         Judgment: Judgment normal.    Diagnostics: FVC 1.89, FEV1 1.55.  Predicted FVC 2.74, predicted FEV1 2.15.  Spirometry indicates mild restriction.  This is consistent with previous spirometry readings.  Assessment and Plan: 1. Gastroesophageal reflux disease, unspecified whether esophagitis present   2. Moderate persistent asthma without complication   3. Seasonal allergic conjunctivitis   4. Perennial allergic rhinitis      Patient Instructions  Reflux Restart Dexilant 60 mg once a day to control reflux. Continue dietary and lifestyle modifications  Recommend follow up with gastroenterologist for further evaluation  Asthma Continue montelukast 10 mg once a day to prevent cough or wheeze Continue Symbicort 160/4.5 mcg-2 puffs twice a day with a spacer to prevent cough or wheeze Continue albuterol 2 puffs every 4 hours as needed for cough or wheeze For asthma flare, add Flovent 220-2 puffs twice a day for 2 weeks or until cough and wheeze free.  Continue Xolair injections 300 mg once every 4 weeks and have access to an epinephrine auto-injector set.  Allergic rhinitis Continue Nasacort 2 sprays in each nostril once a day as needed for stuffy nose Consider saline nasal rinses as needed for nasal symptoms. Use this before any medicated nasal sprays for best result Continue azelastine nasal spray 2 sprays in each nostril twice a day as needed for nasal symptoms Continue Xyzal 5 mg once a day only as needed for a runny nose or itch Continue allergen avoidance measures directed toward dust mites, cat, and dog as listed below  Allergic conjunctivitis Some over the counter eye drops include Pataday one drop in each eye once a day as needed for red, itchy eyes OR Zaditor one drop in each eye twice a day as needed for red itchy eyes.  Please let us know if this treatment plan is not working well for you  Follow up in 2  months or sooner if needed.  Return in about 2 months (around  01/26/2021), or if symptoms worsen or fail to improve.    Thank you for the opportunity to care for this patient.  Please do not hesitate to contact me with questions.  Gareth Morgan, FNP [facility] [place] [facility] [place]

## 2020-11-27 ENCOUNTER — Other Ambulatory Visit (HOSPITAL_BASED_OUTPATIENT_CLINIC_OR_DEPARTMENT_OTHER): Payer: Self-pay

## 2020-12-01 ENCOUNTER — Other Ambulatory Visit (HOSPITAL_COMMUNITY): Payer: Self-pay

## 2020-12-01 ENCOUNTER — Other Ambulatory Visit (HOSPITAL_BASED_OUTPATIENT_CLINIC_OR_DEPARTMENT_OTHER): Payer: Self-pay

## 2020-12-01 DIAGNOSIS — E042 Nontoxic multinodular goiter: Secondary | ICD-10-CM | POA: Diagnosis not present

## 2020-12-01 DIAGNOSIS — I509 Heart failure, unspecified: Secondary | ICD-10-CM | POA: Diagnosis not present

## 2020-12-01 DIAGNOSIS — E7800 Pure hypercholesterolemia, unspecified: Secondary | ICD-10-CM | POA: Diagnosis not present

## 2020-12-01 DIAGNOSIS — Z0000 Encounter for general adult medical examination without abnormal findings: Secondary | ICD-10-CM | POA: Diagnosis not present

## 2020-12-01 DIAGNOSIS — K5900 Constipation, unspecified: Secondary | ICD-10-CM | POA: Diagnosis not present

## 2020-12-01 DIAGNOSIS — I10 Essential (primary) hypertension: Secondary | ICD-10-CM | POA: Diagnosis not present

## 2020-12-01 DIAGNOSIS — Z23 Encounter for immunization: Secondary | ICD-10-CM | POA: Diagnosis not present

## 2020-12-01 DIAGNOSIS — N183 Chronic kidney disease, stage 3 unspecified: Secondary | ICD-10-CM | POA: Diagnosis not present

## 2020-12-01 DIAGNOSIS — G20 Parkinson's disease: Secondary | ICD-10-CM | POA: Diagnosis not present

## 2020-12-01 DIAGNOSIS — J4540 Moderate persistent asthma, uncomplicated: Secondary | ICD-10-CM | POA: Diagnosis not present

## 2020-12-01 DIAGNOSIS — E1165 Type 2 diabetes mellitus with hyperglycemia: Secondary | ICD-10-CM | POA: Diagnosis not present

## 2020-12-01 DIAGNOSIS — F339 Major depressive disorder, recurrent, unspecified: Secondary | ICD-10-CM | POA: Diagnosis not present

## 2020-12-03 DIAGNOSIS — K5900 Constipation, unspecified: Secondary | ICD-10-CM | POA: Diagnosis not present

## 2020-12-03 DIAGNOSIS — I509 Heart failure, unspecified: Secondary | ICD-10-CM | POA: Diagnosis not present

## 2020-12-03 DIAGNOSIS — N3941 Urge incontinence: Secondary | ICD-10-CM | POA: Diagnosis not present

## 2020-12-03 DIAGNOSIS — J4540 Moderate persistent asthma, uncomplicated: Secondary | ICD-10-CM | POA: Diagnosis not present

## 2020-12-03 DIAGNOSIS — E7800 Pure hypercholesterolemia, unspecified: Secondary | ICD-10-CM | POA: Diagnosis not present

## 2020-12-03 DIAGNOSIS — G20 Parkinson's disease: Secondary | ICD-10-CM | POA: Diagnosis not present

## 2020-12-03 DIAGNOSIS — E1165 Type 2 diabetes mellitus with hyperglycemia: Secondary | ICD-10-CM | POA: Diagnosis not present

## 2020-12-03 DIAGNOSIS — F339 Major depressive disorder, recurrent, unspecified: Secondary | ICD-10-CM | POA: Diagnosis not present

## 2020-12-03 DIAGNOSIS — I10 Essential (primary) hypertension: Secondary | ICD-10-CM | POA: Diagnosis not present

## 2020-12-04 ENCOUNTER — Other Ambulatory Visit (HOSPITAL_BASED_OUTPATIENT_CLINIC_OR_DEPARTMENT_OTHER): Payer: Self-pay

## 2020-12-04 MED FILL — Solifenacin Succinate Tab 10 MG: ORAL | 30 days supply | Qty: 30 | Fill #5 | Status: AC

## 2020-12-08 ENCOUNTER — Other Ambulatory Visit: Payer: Self-pay | Admitting: Family Medicine

## 2020-12-08 ENCOUNTER — Other Ambulatory Visit (HOSPITAL_BASED_OUTPATIENT_CLINIC_OR_DEPARTMENT_OTHER): Payer: Self-pay

## 2020-12-08 DIAGNOSIS — E041 Nontoxic single thyroid nodule: Secondary | ICD-10-CM

## 2020-12-08 MED ORDER — DEXLANSOPRAZOLE 60 MG PO CPDR
1.0000 | DELAYED_RELEASE_CAPSULE | Freq: Every day | ORAL | 5 refills | Status: DC
  Filled 2020-12-08: qty 30, 30d supply, fill #0

## 2020-12-09 ENCOUNTER — Other Ambulatory Visit (HOSPITAL_BASED_OUTPATIENT_CLINIC_OR_DEPARTMENT_OTHER): Payer: Self-pay

## 2020-12-09 ENCOUNTER — Other Ambulatory Visit (HOSPITAL_COMMUNITY): Payer: Self-pay

## 2020-12-09 ENCOUNTER — Telehealth: Payer: Self-pay

## 2020-12-09 NOTE — Telephone Encounter (Signed)
Pts insurance does not cover dexilant please advise to change to lansoprazole or pantoprazole

## 2020-12-09 NOTE — Telephone Encounter (Signed)
Lets try a 30 day trial of lansoprazole 15 mg once a day with no refills and see if this helps her symptoms. If this does not help I recommend her scheduling an appointment with her GI doctor.

## 2020-12-10 ENCOUNTER — Other Ambulatory Visit: Payer: Self-pay

## 2020-12-10 ENCOUNTER — Other Ambulatory Visit (HOSPITAL_BASED_OUTPATIENT_CLINIC_OR_DEPARTMENT_OTHER): Payer: Self-pay

## 2020-12-10 MED ORDER — FUROSEMIDE 20 MG PO TABS
ORAL_TABLET | ORAL | 6 refills | Status: DC
  Filled 2020-12-10: qty 15, 30d supply, fill #0

## 2020-12-10 MED ORDER — LANSOPRAZOLE 15 MG PO CPDR
15.0000 mg | DELAYED_RELEASE_CAPSULE | Freq: Every day | ORAL | 0 refills | Status: DC
  Filled 2020-12-10: qty 30, 30d supply, fill #0

## 2020-12-10 NOTE — Telephone Encounter (Signed)
Spoke with pt explained to her that a new Rx was sent to pharmacy. And to keep the office informed of symptom.

## 2020-12-11 ENCOUNTER — Other Ambulatory Visit (HOSPITAL_BASED_OUTPATIENT_CLINIC_OR_DEPARTMENT_OTHER): Payer: Self-pay

## 2020-12-11 ENCOUNTER — Other Ambulatory Visit: Payer: 59

## 2020-12-15 ENCOUNTER — Other Ambulatory Visit: Payer: Self-pay

## 2020-12-15 ENCOUNTER — Encounter (INDEPENDENT_AMBULATORY_CARE_PROVIDER_SITE_OTHER): Payer: Self-pay | Admitting: Family Medicine

## 2020-12-15 ENCOUNTER — Ambulatory Visit (INDEPENDENT_AMBULATORY_CARE_PROVIDER_SITE_OTHER): Payer: 59 | Admitting: Family Medicine

## 2020-12-15 ENCOUNTER — Ambulatory Visit
Admission: RE | Admit: 2020-12-15 | Discharge: 2020-12-15 | Disposition: A | Discharge status: Home / Self Care | Payer: 59 | Source: Ambulatory Visit | Attending: Family Medicine | Admitting: Family Medicine

## 2020-12-15 VITALS — BP 103/61 | HR 65 | Temp 97.5°F | Ht 66.0 in | Wt 211.0 lb

## 2020-12-15 DIAGNOSIS — N1832 Chronic kidney disease, stage 3b: Secondary | ICD-10-CM

## 2020-12-15 DIAGNOSIS — E041 Nontoxic single thyroid nodule: Secondary | ICD-10-CM

## 2020-12-15 DIAGNOSIS — E1122 Type 2 diabetes mellitus with diabetic chronic kidney disease: Secondary | ICD-10-CM | POA: Diagnosis not present

## 2020-12-15 DIAGNOSIS — Z6839 Body mass index (BMI) 39.0-39.9, adult: Secondary | ICD-10-CM

## 2020-12-15 DIAGNOSIS — Z794 Long term (current) use of insulin: Secondary | ICD-10-CM | POA: Diagnosis not present

## 2020-12-15 NOTE — Progress Notes (Signed)
Chief Complaint:   OBESITY Deanna Rowe is here to discuss her progress with her obesity treatment plan along with follow-up of her obesity related diagnoses. Deanna Rowe is on the Category 3 Plan and practicing portion control and making smarter food choices, such as increasing vegetables and decreasing simple carbohydrates and states she is following her eating plan approximately 50% of the time. Deanna Rowe states she is doing 0 minutes 0 times per week.  Today's visit was #: 38 Starting weight: 237 lbs Starting date: 04/16/2019 Today's weight: 211 lbs Today's date: 12/15/2020 Total lbs lost to date: 26 lbs Total lbs lost since last in-office visit: 0  Interim History: Deanna Rowe's Ozempic was increased to 1 mg by Dr. Buddy Duty (endocrinology). She is tolerating it well. She does not always eat the vegetables on the plan. She tends to  forget to eat lunch at times which leads to low CBGs. She is still trying to get her husband to join a gym so they can both go. Deanna Rowe does not drive.   Subjective:   1. Type 2 diabetes mellitus with stage 3b chronic kidney disease, with long-term current use of insulin (HCC) Deanna Rowe's last A1C was 7.0 at Dr. Cindra Eves office August 23rd. Her Humalog is dosed 4x/day- 10, 11, 12, 14, 16.  Dr. Buddy Duty increased dose of Ozempic 1 mg and she is having  no nausea. She notes constipation. She is using Miralax. Deanna Rowe has had a few low CBGs when lunch was late (as low as 50).   Lab Results  Component Value Date   HGBA1C 7.9 (H) 04/16/2019   HGBA1C 6.9 11/25/2016   HGBA1C 6.0 06/10/2016   Lab Results  Component Value Date   MICROALBUR <0.7 02/12/2016   LDLCALC 57 04/16/2019   CREATININE 1.43 (H) 03/02/2020   Lab Results  Component Value Date   INSULIN 12.3 04/16/2019    Assessment/Plan:   1. Type 2 diabetes mellitus with stage 3b chronic kidney disease, with long-term current use of insulin ([place])  She will continue all medications. We discussed strategies to  remember to each lunch. FU with Dr. Buddy Duty.   2. Obesity: BMI 34.07 Deanna Rowe is currently in the action stage of change. As such, her goal is to continue with weight loss efforts. She has agreed to the Category 3 Plan.   A number was provided for senior transportation services.  Exercise goals: No exercise has been prescribed at this time.  Behavioral modification strategies: no skipping meals.  Deanna Rowe has agreed to follow-up with our clinic in 3-4 weeks.  Objective:   Blood pressure 103/61, pulse 65, temperature (!) 97.5 F (36.4 C), height '5\' 6"'$  (1.676 m), weight 211 lb (95.7 kg), SpO2 96 %. Body mass index is 34.06 kg/m.  General: Cooperative, alert, well developed, in no acute distress. HEENT: Conjunctivae and lids unremarkable. Cardiovascular: Regular rhythm.  Lungs: Normal work of breathing. Neurologic: No focal deficits.   Lab Results  Component Value Date   CREATININE 1.43 (H) 03/02/2020   BUN 13 03/02/2020   NA 136 03/02/2020   K 4.7 03/02/2020   CL 98 03/02/2020   CO2 27 03/02/2020   Lab Results  Component Value Date   ALT 21 04/16/2019   AST 23 04/16/2019   ALKPHOS 58 04/16/2019   BILITOT 0.3 04/16/2019   Lab Results  Component Value Date   HGBA1C 7.9 (H) 04/16/2019   HGBA1C 6.9 11/25/2016   HGBA1C 6.0 06/10/2016   HGBA1C 5.9 02/12/2016   HGBA1C 6.1 12/01/2015  Lab Results  Component Value Date   INSULIN 12.3 04/16/2019   Lab Results  Component Value Date   TSH 1.780 04/16/2019   Lab Results  Component Value Date   CHOL 155 04/16/2019   HDL 84 04/16/2019   LDLCALC 57 04/16/2019   TRIG 71 04/16/2019   Lab Results  Component Value Date   VD25OH 44.8 12/17/2019   VD25OH 40.9 04/16/2019   Lab Results  Component Value Date   WBC 6.8 04/16/2019   HGB 12.3 04/16/2019   HCT 36.1 04/16/2019   MCV 96 04/16/2019   PLT 274 04/16/2019   No results found for: IRON, TIBC, FERRITIN  Attestation Statements:   Reviewed by clinician on day of  visit: allergies, medications, problem list, medical history, surgical history, family history, social history, and previous encounter notes.  I, Lizbeth Bark, RMA, am acting as Location manager for [employer], [place].   I have reviewed the above documentation for accuracy and completeness, and I agree with the above. -  Georgianne Fick, FNP

## 2020-12-16 ENCOUNTER — Other Ambulatory Visit (HOSPITAL_BASED_OUTPATIENT_CLINIC_OR_DEPARTMENT_OTHER): Payer: Self-pay

## 2020-12-16 ENCOUNTER — Encounter (INDEPENDENT_AMBULATORY_CARE_PROVIDER_SITE_OTHER): Payer: Self-pay | Admitting: Family Medicine

## 2020-12-16 MED ORDER — FUROSEMIDE 20 MG PO TABS
ORAL_TABLET | ORAL | 6 refills | Status: DC
  Filled 2020-12-16: qty 45, 90d supply, fill #0
  Filled 2021-01-01 – 2021-01-04 (×2): qty 15, 30d supply, fill #0

## 2020-12-17 ENCOUNTER — Other Ambulatory Visit (HOSPITAL_BASED_OUTPATIENT_CLINIC_OR_DEPARTMENT_OTHER): Payer: Self-pay

## 2020-12-18 ENCOUNTER — Other Ambulatory Visit (HOSPITAL_BASED_OUTPATIENT_CLINIC_OR_DEPARTMENT_OTHER): Payer: Self-pay

## 2020-12-18 MED ORDER — GABAPENTIN 300 MG PO CAPS
300.0000 mg | ORAL_CAPSULE | Freq: Two times a day (BID) | ORAL | 0 refills | Status: DC
  Filled 2020-12-18: qty 180, 90d supply, fill #0

## 2020-12-18 MED FILL — Valacyclovir HCl Tab 500 MG: ORAL | 90 days supply | Qty: 90 | Fill #2 | Status: AC

## 2020-12-18 MED FILL — Propranolol HCl Tab 40 MG: ORAL | 90 days supply | Qty: 180 | Fill #1 | Status: AC

## 2020-12-21 ENCOUNTER — Other Ambulatory Visit (HOSPITAL_BASED_OUTPATIENT_CLINIC_OR_DEPARTMENT_OTHER): Payer: Self-pay

## 2020-12-21 MED ORDER — INFLUENZA VAC A&B SA ADJ QUAD 0.5 ML IM PRSY
PREFILLED_SYRINGE | INTRAMUSCULAR | 0 refills | Status: DC
  Filled 2020-12-21: qty 0.5, 1d supply, fill #0

## 2020-12-25 ENCOUNTER — Other Ambulatory Visit (HOSPITAL_BASED_OUTPATIENT_CLINIC_OR_DEPARTMENT_OTHER): Payer: Self-pay

## 2020-12-31 ENCOUNTER — Other Ambulatory Visit (HOSPITAL_COMMUNITY): Payer: Self-pay

## 2021-01-01 ENCOUNTER — Ambulatory Visit: Payer: 59 | Attending: Family Medicine | Admitting: Pharmacist

## 2021-01-01 ENCOUNTER — Other Ambulatory Visit: Payer: Self-pay | Admitting: Neurology

## 2021-01-01 ENCOUNTER — Other Ambulatory Visit (HOSPITAL_BASED_OUTPATIENT_CLINIC_OR_DEPARTMENT_OTHER): Payer: Self-pay

## 2021-01-01 ENCOUNTER — Other Ambulatory Visit: Payer: Self-pay

## 2021-01-01 DIAGNOSIS — Z79899 Other long term (current) drug therapy: Secondary | ICD-10-CM

## 2021-01-01 MED FILL — Solifenacin Succinate Tab 10 MG: ORAL | 30 days supply | Qty: 30 | Fill #6 | Status: AC

## 2021-01-01 NOTE — Progress Notes (Signed)
S: Patient presents for review of their specialty medication therapy.  Patient is currently taking Xolair for asthma. Patient is managed by Dr. Doreene Burke for this.   Adherence: confirmed.   Efficacy: reports that Xolair is working well for her.   Dosing: Give subcutaneously.  Can be dosed every 2 or 4 weeks based on baseline serum IgE levels and body weight.  Asthma: SubQ: Dose and frequency based on body weight and pretreatment total IgE serum levels. Dosing should be adjusted during therapy for significant changes in body weight. Dosing should not be adjusted based on total IgE levels taken during treatment or <1 year following interruption of therapy. If therapy has been interrupted for ?1 year, total IgE levels may be re-evaluated for dosage determination.  Pretreatment serum IgE ?30 to 100 units/mL:   >90 to 150 kg: 300 mg every 4 weeks  Dose adjustments: Renal: no dose adjustments  Hepatic: no dose adjustments  Toxicity: Severe hypersensitivity reaction or anaphylaxis: Discontinue treatment. Fever, arthralgia, and rash: Discontinue treatment if this constellation of symptoms occurs.  Drug-drug interactions: none identified   Monitoring: CV effects: none Eosinophilia and vasculitis: none Fever/arthralgia/rash: none Hypersensitivity/Anaphylaxis: none Malignant neoplasms: none Pulmonary function tests (for asthma): none  O: Lab Results  Component Value Date   WBC 6.8 04/16/2019   HGB 12.3 04/16/2019   HCT 36.1 04/16/2019   MCV 96 04/16/2019   PLT 274 04/16/2019      Chemistry      Component Value Date/Time   NA 136 03/02/2020 0920   NA 142 01/12/2017 0914   K 4.7 03/02/2020 0920   K 4.2 01/12/2017 0914   CL 98 03/02/2020 0920   CO2 27 03/02/2020 0920   CO2 28 01/12/2017 0914   BUN 13 03/02/2020 0920   BUN 12.0 01/12/2017 0914   CREATININE 1.43 (H) 03/02/2020 0920   CREATININE 1.58 (H) 10/12/2018 1121   CREATININE 1.3 (H) 01/12/2017 0914      Component  Value Date/Time   CALCIUM 9.4 03/02/2020 0920   CALCIUM 9.6 01/12/2017 0914   ALKPHOS 58 04/16/2019 1451   ALKPHOS 43 01/12/2017 0914   AST 23 04/16/2019 1451   AST 16 10/12/2018 1121   AST 19 01/12/2017 0914   ALT 21 04/16/2019 1451   ALT <6 10/12/2018 1121   ALT 18 01/12/2017 0914   BILITOT 0.3 04/16/2019 1451   BILITOT 0.2 (L) 10/12/2018 1121   BILITOT 0.28 01/12/2017 0914     A/P: 1. Medication review: patient currently on Xolair for asthma. Reviewed the medication with the patient, including the following: Xolair, omalizumab, is a novel IgE blocker.  It appears to reduce rates of hospitalizations, ER visits and unscheduled physician visits due asthma exacerbations when added to standard therapy.  Studies also show a reduction in steroid requirements and improvement in quality of life.  Patient educated on purpose, proper use and potential adverse effects of Xolair.  Following instruction patient verbalized understanding. Patient should always have an EpiPen readily available in the event of anaphylaxis. SubQ: For SubQ injection only; doses >150 mg should be divided over more than one injection site (eg, 225 mg or 300 mg administered as two injections, 375 mg administered as three injections); each injection site should be separated by ?1 inch. Do not inject into moles, scars, bruises, tender areas, or broken skin. Injections may take 5 to 10 seconds to administer (solution is slightly viscous). Administer only under direct medical supervision and observe patient for 2 hours after the  first 3 injections and 30 minutes after subsequent injections Dellia Cloud 2015) or in accordance with individual institution policies and procedures.No recommendations for any changes at this time.   Benard Halsted, PharmD, [place], [place] [phone]

## 2021-01-04 ENCOUNTER — Other Ambulatory Visit (HOSPITAL_COMMUNITY): Payer: Self-pay

## 2021-01-04 ENCOUNTER — Other Ambulatory Visit (HOSPITAL_BASED_OUTPATIENT_CLINIC_OR_DEPARTMENT_OTHER): Payer: Self-pay

## 2021-01-04 MED ORDER — CARBIDOPA-LEVODOPA 25-250 MG PO TABS
ORAL_TABLET | ORAL | 1 refills | Status: DC
  Filled 2021-01-04: qty 180, 90d supply, fill #0
  Filled 2021-05-07: qty 180, 90d supply, fill #1

## 2021-01-05 ENCOUNTER — Ambulatory Visit (INDEPENDENT_AMBULATORY_CARE_PROVIDER_SITE_OTHER): Payer: 59 | Admitting: Family Medicine

## 2021-01-11 ENCOUNTER — Ambulatory Visit (INDEPENDENT_AMBULATORY_CARE_PROVIDER_SITE_OTHER): Payer: 59 | Admitting: Family Medicine

## 2021-01-18 DIAGNOSIS — G4733 Obstructive sleep apnea (adult) (pediatric): Secondary | ICD-10-CM | POA: Diagnosis not present

## 2021-01-20 ENCOUNTER — Other Ambulatory Visit (HOSPITAL_BASED_OUTPATIENT_CLINIC_OR_DEPARTMENT_OTHER): Payer: Self-pay

## 2021-01-20 MED FILL — Atorvastatin Calcium Tab 10 MG (Base Equivalent): ORAL | 90 days supply | Qty: 90 | Fill #2 | Status: AC

## 2021-01-21 DIAGNOSIS — R310 Gross hematuria: Secondary | ICD-10-CM | POA: Diagnosis not present

## 2021-01-25 ENCOUNTER — Telehealth: Payer: Self-pay | Admitting: Neurology

## 2021-01-25 ENCOUNTER — Other Ambulatory Visit (HOSPITAL_COMMUNITY): Payer: Self-pay

## 2021-01-25 NOTE — Telephone Encounter (Signed)
Appt moved due to MD out- LVM & sent mychart msg informing pt.

## 2021-01-27 ENCOUNTER — Other Ambulatory Visit (HOSPITAL_BASED_OUTPATIENT_CLINIC_OR_DEPARTMENT_OTHER): Payer: Self-pay

## 2021-01-27 ENCOUNTER — Other Ambulatory Visit: Payer: Self-pay

## 2021-01-27 ENCOUNTER — Other Ambulatory Visit (HOSPITAL_COMMUNITY): Payer: Self-pay

## 2021-01-27 ENCOUNTER — Encounter: Payer: Self-pay | Admitting: Family Medicine

## 2021-01-27 ENCOUNTER — Ambulatory Visit (INDEPENDENT_AMBULATORY_CARE_PROVIDER_SITE_OTHER): Payer: 59 | Admitting: Family Medicine

## 2021-01-27 VITALS — BP 128/64 | HR 65 | Temp 98.0°F | Resp 17 | Ht 66.0 in | Wt 212.0 lb

## 2021-01-27 DIAGNOSIS — J4540 Moderate persistent asthma, uncomplicated: Secondary | ICD-10-CM

## 2021-01-27 DIAGNOSIS — K219 Gastro-esophageal reflux disease without esophagitis: Secondary | ICD-10-CM | POA: Diagnosis not present

## 2021-01-27 DIAGNOSIS — H1013 Acute atopic conjunctivitis, bilateral: Secondary | ICD-10-CM | POA: Diagnosis not present

## 2021-01-27 DIAGNOSIS — J3089 Other allergic rhinitis: Secondary | ICD-10-CM

## 2021-01-27 MED ORDER — BUDESONIDE-FORMOTEROL FUMARATE 160-4.5 MCG/ACT IN AERO
2.0000 | INHALATION_SPRAY | Freq: Two times a day (BID) | RESPIRATORY_TRACT | 5 refills | Status: DC
  Filled 2021-01-27: qty 10.2, 30d supply, fill #0

## 2021-01-27 NOTE — Progress Notes (Signed)
[place] [address] Dept: [phone]  FOLLOW UP NOTE  Patient ID: Deanna Rowe, female    DOB: 07-27-1955  Age: 65 y.o. MRN: [id number] Date of Office Visit: 01/27/2021  Assessment  Chief Complaint: Follow-up (Pt states she's been having asthma flares, SOB, wheezing, coughing, nasal and chest congestions ongoing for a week now, been taking OTC cough syrup and using albuterol.) and Asthma  HPI Deanna Rowe is a 65 year old female who presents to the clinic for follow-up visit.  She was last seen in the clinic on 11/26/2020 for evaluation of asthma, allergic rhinitis, allergic conjunctivitis, and reflux.  At her last visit, she reported that she had stopped taking Symbicort, Nasacort, azelastine, and Xyzal.  She is accompanied by her husband who assists with history.  At today's visit, she reports that she began to develop symptoms of a "cold" that began about 1 week ago.  She denies fever, sweats, chills, and sick contacts.  She reports her asthma has been poorly controlled over the last 3 to 4 days with symptoms including shortness of breath with activity and rest, intermittent chest tightness, wheeze, and cough producing thick mucus.  She continues montelukast 10 mg once a day and has used albuterol daily for about one week with relief of symptoms.  She is not currently using Symbicort and has not used Symbicort for several months.  She continues at home Xolair injections with no large or local reactions.  Allergic rhinitis is reported as poorly controlled with symptoms including clear rhinorrhea and sneeze for the last week.  She has tried over-the-counter remedies including Tussin cough expectorant and Tylenol without relief of symptoms.  She is not currently using nasal antihistamine, nasal steroid, saline nasal rinses, or oral antihistamine. Her last environmental allergy skin testing was on 02/08/2018 and was positive to dust mite, cat, and dog. Allergic conjunctivitis  is reported as moderately well controlled with antihistamine eyedrops.  Reflux is reported as moderately well controlled with occasional food or liquid regurgitation while coughing.  She denies food or liquid regurgitation while she is not coughing. She reports that she is currently taking Pepcid 20 mg twice a day and is not currently taking Dexilant.  Her medications are listed in the chart.  Drug Allergies:  Allergies  Allergen Reactions   Fluticasone-Salmeterol Anaphylaxis   Codeine Nausea And Vomiting   Fetzima [Levomilnacipran] Nausea And Vomiting   Nsaids Other (See Comments)    Upset stomach    Trulicity [Dulaglutide] Nausea And Vomiting    Significant and undesirably rapid (40 lbs) weight loss    Xanax [Alprazolam] Other (See Comments)    disoriented   Amoxicillin Rash    Has patient had a PCN reaction causing immediate rash, facial/tongue/throat swelling, SOB or lightheadedness with hypotension: Yes Has patient had a PCN reaction causing severe rash involving mucus membranes or skin necrosis: No Has patient had a PCN reaction that required hospitalization: No Has patient had a PCN reaction occurring within the last 10 years: No If all of the above answers are "NO", then may proceed with Cephalosporin use.    Pseudoephedrine Hcl Er Palpitations    Physical Exam: BP 128/64   Pulse 65   Temp 98 F (36.7 C) (Temporal)   Resp 17   Ht 5\' 6"  (1.676 m)   Wt 212 lb (96.2 kg)   SpO2 98%   BMI 34.22 kg/m    Physical Exam Vitals reviewed.  Constitutional:  Appearance: Normal appearance.  HENT:     Head: Normocephalic and atraumatic.     Right Ear: Tympanic membrane normal.     Left Ear: Tympanic membrane normal.     Nose:     Comments: Bilateral nares slightly erythematous with clear nasal drainage noted.  Pharynx normal.  Ears normal.  Eyes normal.    Mouth/Throat:     Pharynx: Oropharynx is clear.  Eyes:     Conjunctiva/sclera: Conjunctivae normal.   Cardiovascular:     Rate and Rhythm: Normal rate and regular rhythm.     Heart sounds: Normal heart sounds. No murmur heard. Pulmonary:     Effort: Pulmonary effort is normal.     Breath sounds: Normal breath sounds.     Comments: Lungs clear to auscultation Musculoskeletal:        General: Normal range of motion.     Cervical back: Normal range of motion and neck supple.  Skin:    General: Skin is warm and dry.  Neurological:     Mental Status: She is alert and oriented to person, place, and time.  Psychiatric:        Mood and Affect: Mood normal.        Behavior: Behavior normal.        Thought Content: Thought content normal.        Judgment: Judgment normal.    Diagnostics: FVC 1.81, FEV1 1.53.  Predicted FVC 2.73, predicted FEV1 2.14.  Spirometry indicates restriction.  This is consistent with previous spirometry readings.  Assessment and Plan: 1. Not well controlled moderate persistent asthma   2. Perennial allergic rhinitis   3. Allergic conjunctivitis of both eyes   4. Gastroesophageal reflux disease, unspecified whether esophagitis present     Meds ordered this encounter  Medications   budesonide-formoterol (SYMBICORT) 160-4.5 MCG/ACT inhaler    Sig: Inhale 2 puffs into the lungs 2 (two) times daily.    Dispense:  10.2 g    Refill:  5     Patient Instructions  Asthma Restart Symbicort 160/4.5 mcg-2 puffs twice a day with a spacer to prevent cough or wheeze Continue montelukast 10 mg once a day to prevent cough or wheeze Continue albuterol 2 puffs every 4 hours as needed for cough or wheeze Continue Xolair injections 300 mg once every 4 weeks and have access to an epinephrine auto-injector set. If no relief or your symptoms worsen we will move forward with chest xray  Allergic rhinitis Begin Nasacort 2 sprays in each nostril once a day as needed for stuffy nose Begin saline nasal rinses as needed for nasal symptoms. Use this before any medicated nasal  sprays for best result Continue azelastine nasal spray 2 sprays in each nostril twice a day as needed for nasal symptoms Continue Xyzal 5 mg once a day only as needed for a runny nose or itch Continue allergen avoidance measures directed toward dust mites, cat, and dog as listed below For thick post nasal drainage, begin Mucinex [phone] mg twice a day and increase your fluid intake in order to thin out mucus  Allergic conjunctivitis Some over the counter eye drops include Pataday one drop in each eye once a day as needed for red, itchy eyes OR Zaditor one drop in each eye twice a day as needed for red itchy eyes  Reflux Continue Pepcid as previously prescribed Continue dietary and lifestyle modifications   Please let us know if this treatment plan is not working well for you  Follow up in 2  months or sooner if needed.   Return in about 2 months (around 03/29/2021), or if symptoms worsen or fail to improve.    Thank you for the opportunity to care for this patient.  Please do not hesitate to contact me with questions.  Gareth Morgan, FNP [facility] [place] [facility] [place]

## 2021-01-27 NOTE — Patient Instructions (Addendum)
Asthma Restart Symbicort 160/4.5 mcg-2 puffs twice a day with a spacer to prevent cough or wheeze Continue montelukast 10 mg once a day to prevent cough or wheeze Continue albuterol 2 puffs every 4 hours as needed for cough or wheeze Continue Xolair injections 300 mg once every 4 weeks and have access to an epinephrine auto-injector set. If no relief or your symptoms worsen we will move forward with chest xray  Allergic rhinitis Begin Nasacort 2 sprays in each nostril once a day as needed for stuffy nose Begin saline nasal rinses as needed for nasal symptoms. Use this before any medicated nasal sprays for best result Continue azelastine nasal spray 2 sprays in each nostril twice a day as needed for nasal symptoms Continue Xyzal 5 mg once a day only as needed for a runny nose or itch Continue allergen avoidance measures directed toward dust mites, cat, and dog as listed below For thick post nasal drainage, begin Mucinex [phone] mg twice a day and increase your fluid intake in order to thin out mucus  Allergic conjunctivitis Some over the counter eye drops include Pataday one drop in each eye once a day as needed for red, itchy eyes OR Zaditor one drop in each eye twice a day as needed for red itchy eyes  Reflux Continue Pepcid as previously prescribed Continue dietary and lifestyle modifications   Please let us know if this treatment plan is not working well for you  Follow up in 2  months or sooner if needed.  Control of Dog or Cat Allergen Avoidance is the best way to manage a dog or cat allergy. If you have a dog or cat and are allergic to dog or cats, consider removing the dog or cat from the home. If you have a dog or cat but don't want to find it a new home, or if your family wants a pet even though someone in the household is allergic, here are some strategies that may help keep symptoms at bay:  Keep the pet out of your bedroom and restrict it to only a few rooms. Be advised  that keeping the dog or cat in only one room will not limit the allergens to that room. Don't pet, hug or kiss the dog or cat; if you do, wash your hands with soap and water. High-efficiency particulate air (HEPA) cleaners run continuously in a bedroom or living room can reduce allergen levels over time. Regular use of a high-efficiency vacuum cleaner or a central vacuum can reduce allergen levels. Giving your dog or cat a bath at least once a week can reduce airborne allergen.   Control of Dust Mite Allergen Dust mites play a major role in allergic asthma and rhinitis. They occur in environments with high humidity wherever human skin is found. Dust mites absorb humidity from the atmosphere (ie, they do not drink) and feed on organic matter (including shed human and animal skin). Dust mites are a microscopic type of insect that you cannot see with the naked eye. High levels of dust mites have been detected from mattresses, pillows, carpets, upholstered furniture, bed covers, clothes, soft toys and any woven material. The principal allergen of the dust mite is found in its feces. A gram of dust may contain 1,000 mites and 250,000 fecal particles. Mite antigen is easily measured in the air during house cleaning activities. Dust mites do not bite and do not cause harm to humans, other than by triggering allergies/asthma.  Ways to decrease your  exposure to dust mites in your home:  1. Encase mattresses, box springs and pillows with a mite-impermeable barrier or cover  2. Wash sheets, blankets and drapes weekly in hot water (130 F) with detergent and dry them in a dryer on the hot setting.  3. Have the room cleaned frequently with a vacuum cleaner and a damp dust-mop. For carpeting or rugs, vacuuming with a vacuum cleaner equipped with a high-efficiency particulate air (HEPA) filter. The dust mite allergic individual should not be in a room which is being cleaned and should wait 1 hour after cleaning  before going into the room.  4. Do not sleep on upholstered furniture (eg, couches).  5. If possible removing carpeting, upholstered furniture and drapery from the home is ideal. Horizontal blinds should be eliminated in the rooms where the person spends the most time (bedroom, study, television room). Washable vinyl, roller-type shades are optimal.  6. Remove all non-washable stuffed toys from the bedroom. Wash stuffed toys weekly like sheets and blankets above.  7. Reduce indoor humidity to less than 50%. Inexpensive humidity monitors can be purchased at most hardware stores. Do not use a humidifier as can make the problem worse and are not recommended.

## 2021-01-28 ENCOUNTER — Ambulatory Visit: Payer: 59 | Admitting: Neurology

## 2021-01-29 ENCOUNTER — Other Ambulatory Visit (HOSPITAL_COMMUNITY): Payer: Self-pay

## 2021-01-29 ENCOUNTER — Encounter: Payer: Self-pay | Admitting: Neurology

## 2021-01-29 ENCOUNTER — Ambulatory Visit (INDEPENDENT_AMBULATORY_CARE_PROVIDER_SITE_OTHER): Payer: 59 | Admitting: Neurology

## 2021-01-29 VITALS — BP 142/89 | HR 69 | Ht 66.0 in | Wt 213.0 lb

## 2021-01-29 DIAGNOSIS — G20 Parkinson's disease: Secondary | ICD-10-CM | POA: Diagnosis not present

## 2021-01-29 DIAGNOSIS — K5909 Other constipation: Secondary | ICD-10-CM | POA: Diagnosis not present

## 2021-01-29 MED ORDER — CARESTART COVID-19 HOME TEST VI KIT
PACK | 0 refills | Status: DC
  Filled 2021-01-29: qty 4, 4d supply, fill #0

## 2021-01-29 NOTE — Progress Notes (Signed)
Subjective:    Patient ID: Deanna Rowe is a 65 y.o. female.  HPI    Star Age, MD, PhD [facility] [address], [address] [place], [place] [address]  I saw patient, Deanna Rowe, in my Neurologic clinic today in follow-up consultation of her parkinsonism.  The patient is accompanied by her husband today. This is her first visit with me, she has previously been followed by Dr. Jannifer Franklin, who is retiring. Deanna Rowe is a 65 year old right-handed woman with an underlying complex medical history of asthma, arthritis, anemia, avascular necrosis of the hip, depression, anxiety, type 2 diabetes, reflux disease, hyperlipidemia, hypertension, kidney disease, restless leg syndrome, history of TIA, thyroid disease, vitamin D deficiency, and obesity, who was diagnosed with parkinsonism several years ago.  She was noted to have right-sided signs.  She was on Abilify in the past.  She is no longer on Abilify for the past 2 years, but she is on multiple psychotropic medications including lorazepam, bupropion, Rexulti, Trintellix, and also takes gabapentin as well as zolpidem.  She has clearly stopped the zolpidem about 3 weeks ago and is currently on melatonin 5 mg each night which has been helpful.  She is on ropinirole for restless leg syndrome per primary care, currently on 2 mg twice daily.  For parkinsonism she is on Sinemet CR, 50-200 mg strength 1 pill 5 times a day.  She last saw Dr. Jannifer Franklin on 08/26/2020, at which time she was advised to add Sinemet IR 25-250 mg strength, 1 pill twice daily, in the morning and at 2 PM daily.    She has a history of urinary incontinence and is currently on Myrbetriq and Vesicare per urology.  She has chronic constipation for at least 1 year and has been on MiraLAX daily, and 2 stool softeners daily. She has been using a cane.  Since the last appointment, she has not fallen thankfully.  She believes that the addition of Sinemet IR has  helped but she only takes 1 in the morning.  She had a brain MRI without contrast on 03/15/2017 and I reviewed the results:   IMPRESSION: Slightly abnormal MRI scan of the brain showing prominent changes of chronic microvascular ischemia.  No acute abnormalities noted.  Overall no significant change compared with prior MRI scan dated 05/23/2014.  For her sleep apnea, she is followed by Dr. Brett Fairy and the nurse practitioner.  She reports 50% compliance with her CPAP.  Sometimes acid reflux bothers her at night which bothers her CPAP usage.  She is followed by Dr. Reece Levy in psychiatry.  Her Past Medical History Is Significant For: Past Medical History:  Diagnosis Date   Anemia    Anxiety    Arthritis    L HIP   Asthma    Avascular necrosis of hip ([place])    LEFT   Back pain    Chest pain    Constipation    Depression    Diabetes mellitus    Diabetes mellitus, type II (HCC)    Dry cough    Edema, lower extremity    Gait abnormality 11/18/2019   GERD (gastroesophageal reflux disease)    High cholesterol    Hypertension    Insomnia    takes Ambien nightly   Joint pain    Kidney disease    Kidney disease    Obesity    Parkinsonism ([place]) 03/02/2017   PONV (postoperative nausea and vomiting)    RLS (restless legs syndrome)  01/15/2018   Sleep apnea    Swallowing difficulty    Thyroid disease    TIA (transient ischemic attack) 2012   no residual problems   Urgency incontinence    Vitamin D deficiency     Her Past Surgical History Is Significant For: Past Surgical History:  Procedure Laterality Date   DILATION AND CURETTAGE OF UTERUS     ESOPHAGEAL MANOMETRY N/A 09/03/2012   Procedure: ESOPHAGEAL MANOMETRY (EM);  Surgeon: Garlan Fair, MD;  Location: [facility];  Service: Endoscopy;  Laterality: N/A;   ESOPHAGEAL MANOMETRY N/A 06/19/2017   Procedure: ESOPHAGEAL MANOMETRY (EM);  Surgeon: Clarene Essex, MD;  Location: [facility];  Service: Endoscopy;  Laterality: N/A;    EXPLORATORY LAPAROTOMY     GANGLION CYST EXCISION     JOINT REPLACEMENT  2011   rt total hip   KNEE ARTHROSCOPY     PILONIDAL CYST / SINUS EXCISION     TOTAL HIP ARTHROPLASTY Left 10/09/2013   Procedure: LEFT TOTAL HIP ARTHROPLASTY ANTERIOR APPROACH;  Surgeon: [name], MD;  Location: [facility];  Service: Orthopedics;  Laterality: Left;    Her Family History Is Significant For: Family History  Problem Relation Age of Onset   Alcohol abuse Brother    Alcohol abuse Brother    Alcohol abuse Brother    Alcohol abuse Brother    High blood pressure Mother    Hyperlipidemia Mother    Heart disease Mother    Heart disease Father    Alcoholism Father    Allergic rhinitis Sister    Diabetes Neg Hx    Angioedema Neg Hx    Asthma Neg Hx    Eczema Neg Hx    Immunodeficiency Neg Hx    Urticaria Neg Hx    Breast cancer Neg Hx     Her Social History Is Significant For: Social History   Socioeconomic History   Marital status: Married    Spouse name: Simona Huh   Number of children: Not on file   Years of education: Not on file   Highest education level: Not on file  Occupational History   Occupation: retired Therapist, sports  Tobacco Use   Smoking status: Never   Smokeless tobacco: Never  Vaping Use   Vaping Use: Never used  Substance and Sexual Activity   Alcohol use: No    Alcohol/week: 0.0 standard drinks   Drug use: No   Sexual activity: Not Currently  Other Topics Concern   Not on file  Social History Narrative   Lives with husband   Caffeine use: none   Right handed    Social Determinants of Health   Financial Resource Strain: Not on file  Food Insecurity: Not on file  Transportation Needs: Not on file  Physical Activity: Not on file  Stress: Not on file  Social Connections: Not on file    Her Allergies Are:  Allergies  Allergen Reactions   Fluticasone-Salmeterol Anaphylaxis   Codeine Nausea And Vomiting   Fetzima [Levomilnacipran] Nausea And Vomiting   Nsaids Other  (See Comments)    Upset stomach    Trulicity [Dulaglutide] Nausea And Vomiting    Significant and undesirably rapid (40 lbs) weight loss    Xanax [Alprazolam] Other (See Comments)    disoriented   Amoxicillin Rash    Has patient had a PCN reaction causing immediate rash, facial/tongue/throat swelling, SOB or lightheadedness with hypotension: Yes Has patient had a PCN reaction causing severe rash involving mucus membranes or skin necrosis:  No Has patient had a PCN reaction that required hospitalization: No Has patient had a PCN reaction occurring within the last 10 years: No If all of the above answers are "NO", then may proceed with Cephalosporin use.    Pseudoephedrine Hcl Er Palpitations  :   Her Current Medications Are:  Outpatient Encounter Medications as of 01/29/2021  Medication Sig   ACCU-CHEK FASTCLIX LANCETS MISC USE TO CHECK BLOOD SUGAR 3 TIMES A DAY   ACCU-CHEK GUIDE test strip USE TO CHECK BLOOD SUGAR 3 TIMES PER DAY. (Patient taking differently: Use to check blood sugar 4 times per day.)   albuterol (VENTOLIN HFA) 108 (90 Base) MCG/ACT inhaler Inhale 2 puffs into the lungs as needed for wheezing or shortness of breath.   aspirin EC 81 MG tablet Take 81 mg by mouth every evening.   atorvastatin (LIPITOR) 10 MG tablet TAKE 1 TABLET BY MOUTH AT BEDTIME   azelastine (ASTELIN) 0.1 % nasal spray Place 2 sprays into both nostrils 2 (two) times daily as needed.   brexpiprazole (REXULTI) 2 MG TABS tablet Take 1 tablet by mouth every morning   buPROPion (WELLBUTRIN XL) 150 MG 24 hr tablet Take 3 tablets by mouth every morning   Calcium Carbonate-Vitamin D 600-400 MG-UNIT tablet Take 1 tablet by mouth daily.   carbidopa-levodopa (SINEMET CR) 50-200 MG tablet TAKE 1 TABLET BY MOUTH 5 (FIVE) TIMES DAILY.   carbidopa-levodopa (SINEMET) 25-250 MG tablet Take 1 tablet by mouth in the morning and at 2 PM   EPINEPHrine (EPIPEN 2-PAK) 0.3 mg/0.3 mL IJ SOAJ injection Inject 0.3 mg into the  muscle as needed for anaphylaxis.   gabapentin (NEURONTIN) 300 MG capsule TAKE 1 CAPSULE BY MOUTH TWICE DAILY   glucose blood test strip CHECK BLOOD SUGAR 4 TIMES DAILY   influenza vaccine adjuvanted (FLUAD) 0.5 ML injection Inject into the muscle.   Insulin Glargine (BASAGLAR KWIKPEN) 100 UNIT/ML INJECT 44 UNITS UNDER THE SKIN ONCE DAILY **MAX OF 50 UNITS DAILY**   insulin lispro (HUMALOG) 100 UNIT/ML injection SQ - 14 units at breakfast, 12 units at lunch, 16 units at supper   Insulin Pen Needle 32G X 4 MM MISC USE AS DIRECTED   Lancets (FREESTYLE) lancets USE TO CHECK BLOOD SUGAR 4 TIMES DAILY FOR 90 DAYS   lansoprazole (PREVACID) 15 MG capsule Take 1 capsule (15 mg total) by mouth daily at 12 noon.   levocetirizine (XYZAL) 5 MG tablet Take 1 tablet (5 mg total) by mouth daily as needed.   LORazepam (ATIVAN) 2 MG tablet Take 1 tablet by mouth 3 times a day   losartan (COZAAR) 100 MG tablet Take 1 tablet by mouth once a day   melatonin 5 MG TABS Take 5 mg by mouth at bedtime.   mirabegron ER (MYRBETRIQ) 50 MG TB24 tablet Take 1 tablet by mouth once daily   Olopatadine HCl (PATADAY) 0.2 % SOLN Place 1 drop into both eyes daily as needed.   omalizumab Arvid Right) 150 MG/ML prefilled syringe Inject 300 mg into the skin every 28 (twenty-eight) days.   propranolol (INDERAL) 40 MG tablet TAKE 1 TABLET BY MOUTH TWICE DAILY   rOPINIRole (REQUIP) 2 MG tablet Take 1 tablet by mouth twice a day   Semaglutide, 1 MG/DOSE, (OZEMPIC, 1 MG/DOSE,) 4 MG/3ML SOPN Inject 1mg  under the skin once weekly   solifenacin (VESICARE) 10 MG tablet TAKE 1 TABLET BY MOUTH ONCE DAILY   triamcinolone (NASACORT) 55 MCG/ACT AERO nasal inhaler Place 2 sprays into the nose  daily.   valACYclovir (VALTREX) 500 MG tablet TAKE 1 TABLET BY MOUTH ONCE DAILY   vitamin C (ASCORBIC ACID) 500 MG tablet Take 500 mg by mouth daily.   Vitamin D, Ergocalciferol, 2000 units CAPS Take 1 capsule by mouth daily. 2000 units daily.   vortioxetine  HBr (TRINTELLIX) 20 MG TABS tablet Take 1 tablet(s) by mouth every morning   amLODipine (NORVASC) 5 MG tablet TAKE ONE TABLET BY MOUTH DAILY. (Patient not taking: Reported on 01/29/2021)   budesonide-formoterol (SYMBICORT) 160-4.5 MCG/ACT inhaler Inhale 2 puffs into the lungs 2 (two) times daily. (Patient not taking: Reported on 01/29/2021)   furosemide (LASIX) 20 MG tablet Take 1 tablet by mouth every other day (Patient not taking: No sig reported)   zolpidem (AMBIEN) 5 MG tablet Take 1 tablet by mouth at bedtime (Patient not taking: Reported on 01/29/2021)   [DISCONTINUED] budesonide-formoterol (SYMBICORT) 160-4.5 MCG/ACT inhaler Inhale 2 puffs into the lungs twice a day with spacer   [DISCONTINUED] furosemide (LASIX) 20 MG tablet Take 1 tablet by mouth every other day (Patient not taking: Reported on 01/27/2021)   Facility-Administered Encounter Medications as of 01/29/2021  Medication   tranexamic acid (CYKLOKAPRON) topical -INTRAOP  :   Review of Systems:  Out of a complete 14 point review of systems, all are reviewed and negative with the exception of these symptoms as listed below:  Review of Systems  Neurological:        Here with husband for Parkinson's FU, former patient of Dr Jannifer Franklin. Sees NP for OSA. Patient has had a few episodes of freezing, no falls, overall doing pretty well.    Objective:  Neurological Exam  Physical Exam Physical Examination:   Vitals:   01/29/21 1016  BP: (!) 142/89  Pulse: 69    General Examination: The patient is a very pleasant 65 y.o. female in no acute distress. She appears well-developed and well-nourished and well groomed.   HEENT: Normocephalic, atraumatic, pupils are equal, round and reactive to light, corrective eyeglasses in place, extraocular tracking fairly well-preserved, mild nuchal rigidity noted.  Mild facial masking noted, no significant dysarthria, mild hypophonia.  Hearing is grossly intact, bilateral hearing aids in place.   Airway examination reveals mild mouth dryness, full dentures on top and partials on the bottom.  No carotid bruits.  No lip, neck or jaw tremor.    Chest: Clear to auscultation without wheezing, rhonchi or crackles noted.  Heart: S1+S2+0, regular and normal without murmurs, rubs or gallops noted.   Abdomen: Soft, non-tender and non-distended.  Extremities: There is no pitting edema in the distal lower extremities bilaterally.   Skin: Warm and dry without trophic changes noted.   Musculoskeletal: exam reveals no obvious joint deformities.   Neurologically:  Mental status: The patient is awake, alert and oriented in all 4 spheres. Her immediate and remote memory, attention, language skills and fund of knowledge are appropriate. There is no evidence of aphasia, agnosia, apraxia or anomia. Speech is as above. Thought process is linear. Mood is normal and affect is normal.  Cranial nerves II - XII are as described above under HEENT exam.  Motor exam: Normal bulk, strength and tone is noted. There is no resting or postural tremor currently.  Romberg is not tested for safety concerns. Moderate bradykinesia is noted.  Fine motor skills are globally mild to moderately impaired, no telltale lateralization noted.  Cerebellar testing: No dysmetria or intention tremor. There is no truncal or gait ataxia.  Sensory exam: intact to  light touch in the upper and lower extremities.  Gait, station and balance: She stands slowly, posture is mildly stooped for age.  She walks slowly and cautiously, has a single-point cane on the right side, decreased arm swing noted on the right more than left when she walked briefly without the cane.  She turns slowly, no telltale shuffling noted.  Assessment and Plan:   In summary, Claudean P Shippee is a very pleasant 65 y.o.-year old female with an underlying complex medical history of asthma, arthritis, anemia, avascular necrosis of the hip, depression, anxiety, type 2  diabetes, reflux disease, hyperlipidemia, hypertension, kidney disease, restless leg syndrome, history of TIA, thyroid disease, vitamin D deficiency, OSA on CPAP and obesity, who presents for follow-up consultation of her parkinsonism.  She currently does not have telltale lateralization, her situation is complicated because of her psychiatric history and using multiple psychotropic medications including Abilify in the past and currently also Rexulti.  Whether or not she has drug-induced parkinsonism is not completely clear to me.  Nevertheless, she has benefited from levodopa therapy.  She is currently on Sinemet CR 50-200 mg strength 1 pill 5 times a day and she added Sinemet IR 25-250 mg strength once daily since May 2022.  She is encouraged to continue with the current regimen as it has been helpful.  If need be, if she has recurrent freezing spells or more off time, she can certainly add the second dose of the Sinemet IR as previously discussed with Dr. Jannifer Franklin.  She is advised to pursue a healthy lifestyle, try to stay active, we talked about the importance of fall prevention and being proactive about constipation issues.  She is encouraged to be consistent with her CPAP.  She is advised to follow-up with me for parkinsonism recheck in about 6 months.  I answered all the questions today and the patient and her husband were in agreement.   I spent 40 minutes in total face-to-face time and in reviewing records during pre-charting, more than 50% of which was spent in counseling and coordination of care, reviewing test results, reviewing medications and treatment regimen and/or in discussing or reviewing the diagnosis of parkinsonism, the prognosis and treatment options. Pertinent laboratory and imaging test results that were available during this visit with the patient were reviewed by me and considered in my medical decision making (see chart for details).

## 2021-01-29 NOTE — Patient Instructions (Signed)
It was nice to meet you both today.  It sounds like you have benefited from the addition of immediate release levodopa.  You can add a second dose in the afternoon if you feel that you are having more freezing spells consistently or more "off" time.  As discussed, it may be difficult to tease out if you have true Parkinson's disease or parkinsonism from taking psychotropic medications particularly with the Rexulti and previously Abilify.  Nevertheless, as you know, Parkinson's disease or parkinsonism can affect your balance, your memory, your mood, your bowel and bladder function, your posture, balance and walking. Overall you are doing fairly well but I do want to suggest a few things today:  Remember to drink plenty of fluid at least 6 glasses (8 oz each), eat healthy meals and do not skip any meals. Try to eat protein with a every meal and eat a healthy snack such as fruit or nuts in between meals. Try to keep a regular sleep-wake schedule and try to exercise daily, particularly in the form of walking, 20-30 minutes a day, if you can.   Taking your medication on schedule is key.   I recommend that you continue with the long-acting Sinemet at 9.5 times a day as well.  Try to stay active physically and mentally.   Continue to be proactive about constipation issues.     I would like to see you back in 6 months, sooner if we need to. Please call us with any interim questions, concerns, problems, updates or refill requests.  Our phone number is [phone]. We also have an after hours call service for urgent matters and there is a physician on-call for urgent questions, that cannot wait till the next work day. For any emergencies you know to call 911 or go to the nearest emergency room.   You can email me through my chart and also leave a phone message for our nurses.

## 2021-02-01 ENCOUNTER — Ambulatory Visit (INDEPENDENT_AMBULATORY_CARE_PROVIDER_SITE_OTHER): Payer: 59 | Admitting: Family Medicine

## 2021-02-01 ENCOUNTER — Encounter (INDEPENDENT_AMBULATORY_CARE_PROVIDER_SITE_OTHER): Payer: Self-pay | Admitting: Family Medicine

## 2021-02-01 ENCOUNTER — Other Ambulatory Visit: Payer: Self-pay

## 2021-02-01 VITALS — BP 139/80 | HR 63 | Temp 97.4°F | Ht 66.0 in | Wt 211.0 lb

## 2021-02-01 DIAGNOSIS — I152 Hypertension secondary to endocrine disorders: Secondary | ICD-10-CM | POA: Diagnosis not present

## 2021-02-01 DIAGNOSIS — E1159 Type 2 diabetes mellitus with other circulatory complications: Secondary | ICD-10-CM | POA: Diagnosis not present

## 2021-02-01 DIAGNOSIS — N1832 Chronic kidney disease, stage 3b: Secondary | ICD-10-CM | POA: Diagnosis not present

## 2021-02-01 DIAGNOSIS — E1122 Type 2 diabetes mellitus with diabetic chronic kidney disease: Secondary | ICD-10-CM

## 2021-02-01 DIAGNOSIS — Z6839 Body mass index (BMI) 39.0-39.9, adult: Secondary | ICD-10-CM | POA: Diagnosis not present

## 2021-02-01 DIAGNOSIS — Z794 Long term (current) use of insulin: Secondary | ICD-10-CM

## 2021-02-01 NOTE — Progress Notes (Signed)
Chief Complaint:   OBESITY Deanna Rowe is here to discuss her progress with her obesity treatment plan along with follow-up of her obesity related diagnoses. Deanna Rowe is on the Category 3 Plan and states she is following her eating plan approximately 50% of the time. Deanna Rowe states she is doing 0 minutes 0 times per week.  Today's visit was #: 72 Starting weight: 237 lbs Starting date: 04/16/2019 Today's weight: 211 lbs Today's date: 02/01/2021 Total lbs lost to date: 26 lbs Total lbs lost since last in-office visit: 0  Interim History: Deanna Rowe notes she is having some sweets after supper. Her husband is buying Little [employer]bbie cakes and Honey Buns. She has maintained her weight today. Her water intake is adequate.  Her husband is going to get her a stationary bike at Christmas.   Subjective:   1. Hypertension associated with diabetes ([place]) Deanna Rowe is retaining some water per bioimpedance scale. Her primary care provider stopped Lasix and Amlodipine recently. She has had some hypotension recently and has a tendency to fall due to her Parkinson's. Her  blood pressure is good at 139/80. Deanna Rowe feels her blood pressures are too high.  BP Readings from Last 3 Encounters:  02/01/21 139/80  01/29/21 (!) 142/89  01/27/21 128/64     2. Type 2 diabetes mellitus with stage 3b chronic kidney disease, with long-term current use of insulin ([place]) Deanna Rowe's diabetes is well controlled. Her last A1C was 6.9 in Care Everywhere. Her CBGs run as high as 181 in the afternoon. She is on Ozempic, Basaglar and Humalog. Her Insulin dose is 10 units at breakfast, 12 at lunch, 14 at dinner, and 16 at bedtime. Lantus dose is 44 at every night at bedtime. She is not skipping meals and denies hypoglycemia.   Lab Results  Component Value Date   HGBA1C 7.9 (H) 04/16/2019   HGBA1C 6.9 11/25/2016   HGBA1C 6.0 06/10/2016   Lab Results  Component Value Date   MICROALBUR <0.7 02/12/2016   LDLCALC 57 04/16/2019    CREATININE 1.43 (H) 03/02/2020   Lab Results  Component Value Date   INSULIN 12.3 04/16/2019    Assessment/Plan:   1. Hypertension associated with diabetes ([place]) Deanna Rowe will continue Losartan and Propranolol. She will discuss any concerns with her primary care provider about blood pressures.   2. Type 2 diabetes mellitus with stage 3b chronic kidney disease, with long-term current use of insulin ([facility]) Deanna Rowe will continue all medications. She will reduce simple carbohydrates (sweets).   3. Obesity: BMI 34.07 Deanna Rowe is currently in the action stage of change. As such, her goal is to continue with weight loss efforts. She has agreed to the Category 3 Plan.   Deanna Rowe's husband put sweets someplace other than the kitchen. She will also ask him not to buy sweets.   Exercise goals: No exercise has been prescribed at this time.  Behavioral modification strategies: decreasing simple carbohydrates and dealing with family or coworker sabotage.  Deanna Rowe has agreed to follow-up with our clinic in 3-4 weeks.  Objective:   Blood pressure 139/80, pulse 63, temperature (!) 97.4 F (36.3 C), height 5\' 6"  (1.676 m), weight 211 lb (95.7 kg), SpO2 96 %. Body mass index is 34.06 kg/m.  General: Cooperative, alert, well developed, in no acute distress. HEENT: Conjunctivae and lids unremarkable. Cardiovascular: Regular rhythm.  Lungs: Normal work of breathing. Neurologic: No focal deficits.   Lab Results  Component Value Date   CREATININE 1.43 (H) 03/02/2020   BUN 13  03/02/2020   NA 136 03/02/2020   K 4.7 03/02/2020   CL 98 03/02/2020   CO2 27 03/02/2020   Lab Results  Component Value Date   ALT 21 04/16/2019   AST 23 04/16/2019   ALKPHOS 58 04/16/2019   BILITOT 0.3 04/16/2019   Lab Results  Component Value Date   HGBA1C 7.9 (H) 04/16/2019   HGBA1C 6.9 11/25/2016   HGBA1C 6.0 06/10/2016   HGBA1C 5.9 02/12/2016   HGBA1C 6.1 12/01/2015   Lab Results  Component Value Date    INSULIN 12.3 04/16/2019   Lab Results  Component Value Date   TSH 1.780 04/16/2019   Lab Results  Component Value Date   CHOL 155 04/16/2019   HDL 84 04/16/2019   LDLCALC 57 04/16/2019   TRIG 71 04/16/2019   Lab Results  Component Value Date   VD25OH 44.8 12/17/2019   VD25OH 40.9 04/16/2019   Lab Results  Component Value Date   WBC 6.8 04/16/2019   HGB 12.3 04/16/2019   HCT 36.1 04/16/2019   MCV 96 04/16/2019   PLT 274 04/16/2019   No results found for: IRON, TIBC, FERRITIN  Attestation Statements:   Reviewed by clinician on day of visit: allergies, medications, problem list, medical history, surgical history, family history, social history, and previous encounter notes.  I, Lizbeth Bark, RMA, am acting as Location manager for [employer], [place].   I have reviewed the above documentation for accuracy and completeness, and I agree with the above. -  Georgianne Fick, FNP

## 2021-02-04 ENCOUNTER — Other Ambulatory Visit (HOSPITAL_BASED_OUTPATIENT_CLINIC_OR_DEPARTMENT_OTHER): Payer: Self-pay

## 2021-02-04 MED FILL — Solifenacin Succinate Tab 10 MG: ORAL | 30 days supply | Qty: 30 | Fill #7 | Status: AC

## 2021-02-05 ENCOUNTER — Other Ambulatory Visit (HOSPITAL_BASED_OUTPATIENT_CLINIC_OR_DEPARTMENT_OTHER): Payer: Self-pay

## 2021-02-05 ENCOUNTER — Telehealth: Payer: Self-pay

## 2021-02-05 ENCOUNTER — Ambulatory Visit
Admission: RE | Admit: 2021-02-05 | Discharge: 2021-02-05 | Disposition: A | Discharge status: Home / Self Care | Payer: 59 | Source: Ambulatory Visit | Attending: Family Medicine | Admitting: Family Medicine

## 2021-02-05 ENCOUNTER — Ambulatory Visit: Payer: 59

## 2021-02-05 ENCOUNTER — Other Ambulatory Visit: Payer: Self-pay

## 2021-02-05 ENCOUNTER — Ambulatory Visit: Payer: 59 | Admitting: Interventional Cardiology

## 2021-02-05 DIAGNOSIS — R059 Cough, unspecified: Secondary | ICD-10-CM | POA: Diagnosis not present

## 2021-02-05 DIAGNOSIS — J45991 Cough variant asthma: Secondary | ICD-10-CM | POA: Diagnosis not present

## 2021-02-05 DIAGNOSIS — Z23 Encounter for immunization: Secondary | ICD-10-CM | POA: Diagnosis not present

## 2021-02-05 NOTE — Progress Notes (Signed)
Patient notified of chest xray within normal limits. She reports a productive cough which is worse at nighttime.  She reports occasional shortness of breath with activity and nasal congestion and denies wheeze.  She continues montelukast 10 mg once a day and is not using albuterol or Symbicort 160.  She agrees to increase Prevacid 15 mg to Prevacid 15 mg twice a day and to begin Symbicort 160-2 puffs twice a day.  She will call the clinic with any worsening symptoms or if she develops a fever.

## 2021-02-05 NOTE — Progress Notes (Deleted)
.  covidobs

## 2021-02-05 NOTE — Progress Notes (Signed)
   Covid-19 Vaccination Clinic  Name:  Deanna Rowe    MRN: [id number] DOB: Jun 09, 1955  02/05/2021  Deanna Rowe was observed post Covid-19 immunization for 15 minutes without incident. She was provided with Vaccine Information Sheet and instruction to access the V-Safe system.   Deanna Rowe was instructed to call 911 with any severe reactions post vaccine: Difficulty breathing  Swelling of face and throat  A fast heartbeat  A bad rash all over body  Dizziness and weakness   Immunizations Administered     Name Date Dose VIS Date Route   Pfizer Covid-19 Vaccine Bivalent Booster 02/05/2021 12:28 PM 0.3 mL 12/02/2020 Intramuscular   Manufacturer: [place]   Lot: [id number]   [place]: [phone]

## 2021-02-05 NOTE — Telephone Encounter (Signed)
Spoke with Patient to let her know Webb Silversmith wants a stat chest xray 2 view for cough. Webb Silversmith will be calling her once she gets the results.

## 2021-02-05 NOTE — Telephone Encounter (Signed)
Chest xray results verbally given by Webb Silversmith and she also spoke to the patient.

## 2021-02-05 NOTE — Telephone Encounter (Signed)
Lets move forward with a 2 view chest xray with a stat result please.

## 2021-02-05 NOTE — Telephone Encounter (Signed)
Deanna Rowe you saw this patient on 01/27/2021. Patient stating her cough is no better. She said she is taking all her medications. She says she's coughing so much she can't use her cpap machine. Please advise.

## 2021-02-08 ENCOUNTER — Other Ambulatory Visit (HOSPITAL_BASED_OUTPATIENT_CLINIC_OR_DEPARTMENT_OTHER): Payer: Self-pay

## 2021-02-08 DIAGNOSIS — N1832 Chronic kidney disease, stage 3b: Secondary | ICD-10-CM | POA: Diagnosis not present

## 2021-02-08 MED ORDER — AMLODIPINE BESYLATE 5 MG PO TABS
5.0000 mg | ORAL_TABLET | Freq: Every day | ORAL | 0 refills | Status: DC
  Filled 2021-02-08: qty 90, 90d supply, fill #0

## 2021-02-09 ENCOUNTER — Telehealth: Payer: Self-pay | Admitting: Family Medicine

## 2021-02-09 NOTE — Telephone Encounter (Signed)
Pt states she needs something for cough, her inhalers are not working.

## 2021-02-09 NOTE — Telephone Encounter (Signed)
Lm for pt to call us back we need to know how long she has been coughing, what exactly she has been taking for pts cough

## 2021-02-11 ENCOUNTER — Telehealth: Payer: Self-pay

## 2021-02-11 ENCOUNTER — Other Ambulatory Visit (HOSPITAL_BASED_OUTPATIENT_CLINIC_OR_DEPARTMENT_OTHER): Payer: Self-pay

## 2021-02-11 NOTE — Telephone Encounter (Signed)
Patient called and stated that he sister her some medication for her coughing as her sister had a cough as well. Patient stated that the medication is Bromphen. Patient would like to know if this medication could be prescribed for her as well as it worked for her sister and she wants to try it. Please advise on this medication for patient thank you!

## 2021-02-16 ENCOUNTER — Other Ambulatory Visit: Payer: Self-pay

## 2021-02-18 DIAGNOSIS — N1831 Chronic kidney disease, stage 3a: Secondary | ICD-10-CM | POA: Diagnosis not present

## 2021-02-18 DIAGNOSIS — E559 Vitamin D deficiency, unspecified: Secondary | ICD-10-CM | POA: Diagnosis not present

## 2021-02-18 DIAGNOSIS — I129 Hypertensive chronic kidney disease with stage 1 through stage 4 chronic kidney disease, or unspecified chronic kidney disease: Secondary | ICD-10-CM | POA: Diagnosis not present

## 2021-02-18 DIAGNOSIS — D631 Anemia in chronic kidney disease: Secondary | ICD-10-CM | POA: Diagnosis not present

## 2021-02-22 ENCOUNTER — Encounter (INDEPENDENT_AMBULATORY_CARE_PROVIDER_SITE_OTHER): Payer: Self-pay

## 2021-02-22 ENCOUNTER — Other Ambulatory Visit (HOSPITAL_COMMUNITY): Payer: Self-pay

## 2021-02-22 NOTE — Telephone Encounter (Signed)
Can you please let this patient know that this is an OTC medication. It does contain a decongestant and if she does choose to use this medication she needs to monitor her blood pressure closely/

## 2021-02-23 ENCOUNTER — Ambulatory Visit (INDEPENDENT_AMBULATORY_CARE_PROVIDER_SITE_OTHER): Payer: 59 | Admitting: Family Medicine

## 2021-02-23 ENCOUNTER — Other Ambulatory Visit (HOSPITAL_BASED_OUTPATIENT_CLINIC_OR_DEPARTMENT_OTHER): Payer: Self-pay

## 2021-02-23 MED ORDER — PFIZER COVID-19 VAC BIVALENT 30 MCG/0.3ML IM SUSP
INTRAMUSCULAR | 0 refills | Status: DC
  Filled 2021-02-23: qty 0.3, 1d supply, fill #0

## 2021-02-24 ENCOUNTER — Other Ambulatory Visit (HOSPITAL_BASED_OUTPATIENT_CLINIC_OR_DEPARTMENT_OTHER): Payer: Self-pay

## 2021-02-24 DIAGNOSIS — I700 Atherosclerosis of aorta: Secondary | ICD-10-CM | POA: Diagnosis not present

## 2021-02-24 DIAGNOSIS — E1151 Type 2 diabetes mellitus with diabetic peripheral angiopathy without gangrene: Secondary | ICD-10-CM | POA: Diagnosis not present

## 2021-02-24 DIAGNOSIS — E042 Nontoxic multinodular goiter: Secondary | ICD-10-CM | POA: Diagnosis not present

## 2021-02-24 DIAGNOSIS — N1832 Chronic kidney disease, stage 3b: Secondary | ICD-10-CM | POA: Diagnosis not present

## 2021-02-24 MED ORDER — OZEMPIC (1 MG/DOSE) 4 MG/3ML ~~LOC~~ SOPN
1.0000 mg | PEN_INJECTOR | SUBCUTANEOUS | 4 refills | Status: DC
Start: 2021-02-24 — End: 2021-05-11

## 2021-02-24 NOTE — Telephone Encounter (Signed)
Thank you :)

## 2021-03-02 ENCOUNTER — Other Ambulatory Visit (HOSPITAL_COMMUNITY): Payer: Self-pay

## 2021-03-08 ENCOUNTER — Other Ambulatory Visit (HOSPITAL_BASED_OUTPATIENT_CLINIC_OR_DEPARTMENT_OTHER): Payer: Self-pay

## 2021-03-08 ENCOUNTER — Other Ambulatory Visit (HOSPITAL_COMMUNITY): Payer: Self-pay

## 2021-03-08 MED FILL — Solifenacin Succinate Tab 10 MG: ORAL | 30 days supply | Qty: 30 | Fill #8 | Status: CN

## 2021-03-08 MED FILL — Solifenacin Succinate Tab 10 MG: ORAL | 30 days supply | Qty: 30 | Fill #8 | Status: AC

## 2021-03-17 ENCOUNTER — Encounter (INDEPENDENT_AMBULATORY_CARE_PROVIDER_SITE_OTHER): Payer: Self-pay | Admitting: Family Medicine

## 2021-03-17 ENCOUNTER — Ambulatory Visit (INDEPENDENT_AMBULATORY_CARE_PROVIDER_SITE_OTHER): Payer: 59 | Admitting: Family Medicine

## 2021-03-17 ENCOUNTER — Other Ambulatory Visit: Payer: Self-pay

## 2021-03-17 VITALS — BP 122/72 | HR 68 | Temp 97.5°F | Ht 66.0 in | Wt 211.0 lb

## 2021-03-17 DIAGNOSIS — E1122 Type 2 diabetes mellitus with diabetic chronic kidney disease: Secondary | ICD-10-CM | POA: Diagnosis not present

## 2021-03-17 DIAGNOSIS — N1832 Chronic kidney disease, stage 3b: Secondary | ICD-10-CM | POA: Diagnosis not present

## 2021-03-17 DIAGNOSIS — Z6839 Body mass index (BMI) 39.0-39.9, adult: Secondary | ICD-10-CM | POA: Diagnosis not present

## 2021-03-17 DIAGNOSIS — Z794 Long term (current) use of insulin: Secondary | ICD-10-CM | POA: Diagnosis not present

## 2021-03-17 NOTE — Progress Notes (Signed)
Chief Complaint:   OBESITY Deanna Rowe is here to discuss her progress with her obesity treatment plan along with follow-up of her obesity related diagnoses. Deanna Rowe is on the Category 3 Plan and states she is following her eating plan approximately 50% of the time. Deanna Rowe states she is using her stationary bike for 15 minutes 7 times per week.  Today's visit was #: 43 Starting weight: 237 lbs Starting date: 04/16/2019 Today's weight: 211 lbs Today's date: 03/17/2021 Total lbs lost to date: 26 lbs Total lbs lost since last in-office visit: 0  Interim History: Deanna Rowe has maintained her weight over Thanksgiving. She got a stationary bike and is riding it every day. Her goal is to ride one hour daily. She has cut down on sweets. She tends to skip lunch.  Her hunger is satisfied. She is not snacking at night like she used to.  Subjective:   1. Type 2 diabetes mellitus with stage 3b chronic kidney disease, with long-term current use of insulin ([facility]) Deanna Rowe's A1C is at goal. She saw Dr. Buddy Duty (endocrinologist) November 23 and her A1C was 6.4 -reduced from 6.9. She is on humalog 4 times per day and Basaglar. She is also on Ozempic 1 mg. She has hypoglycemia when she skips meals.   Lab Results  Component Value Date   HGBA1C 7.9 (H) 04/16/2019   HGBA1C 6.9 11/25/2016   HGBA1C 6.0 06/10/2016   Lab Results  Component Value Date   MICROALBUR <0.7 02/12/2016   LDLCALC 57 04/16/2019   CREATININE 1.43 (H) 03/02/2020   Lab Results  Component Value Date   INSULIN 12.3 04/16/2019    Assessment/Plan:   1. Type 2 diabetes mellitus with stage 3b chronic kidney disease, with long-term current use of insulin ([facility]) Deanna Rowe will follow up with Dr. Buddy Duty 2 months.  Continue all medications as directed by Dr. Buddy Duty  2. Obesity: BMI 34.07 Deanna Rowe is currently in the action stage of change. As such, her goal is to continue with weight loss efforts. She has agreed to the Category 3 Plan.    Deanna Rowe will set phone alarm to remind her to eat lunch.  Exercise goals:  As is.   Behavioral modification strategies: no skipping meals.  Deanna Rowe has agreed to follow-up with our clinic in 4 weeks.  Objective:   Blood pressure 122/72, pulse 68, temperature (!) 97.5 F (36.4 C), height 5\' 6"  (1.676 m), weight 211 lb (95.7 kg), SpO2 94 %. Body mass index is 34.06 kg/m.  General: Cooperative, alert, well developed, in no acute distress. HEENT: Conjunctivae and lids unremarkable. Cardiovascular: Regular rhythm.  Lungs: Normal work of breathing. Neurologic: No focal deficits.   Lab Results  Component Value Date   CREATININE 1.43 (H) 03/02/2020   BUN 13 03/02/2020   NA 136 03/02/2020   K 4.7 03/02/2020   CL 98 03/02/2020   CO2 27 03/02/2020   Lab Results  Component Value Date   ALT 21 04/16/2019   AST 23 04/16/2019   ALKPHOS 58 04/16/2019   BILITOT 0.3 04/16/2019   Lab Results  Component Value Date   HGBA1C 7.9 (H) 04/16/2019   HGBA1C 6.9 11/25/2016   HGBA1C 6.0 06/10/2016   HGBA1C 5.9 02/12/2016   HGBA1C 6.1 12/01/2015   Lab Results  Component Value Date   INSULIN 12.3 04/16/2019   Lab Results  Component Value Date   TSH 1.780 04/16/2019   Lab Results  Component Value Date   CHOL 155 04/16/2019   HDL  84 04/16/2019   LDLCALC 57 04/16/2019   TRIG 71 04/16/2019   Lab Results  Component Value Date   VD25OH 44.8 12/17/2019   VD25OH 40.9 04/16/2019   Lab Results  Component Value Date   WBC 6.8 04/16/2019   HGB 12.3 04/16/2019   HCT 36.1 04/16/2019   MCV 96 04/16/2019   PLT 274 04/16/2019   No results found for: IRON, TIBC, FERRITIN  Attestation Statements:   I, Lizbeth Bark, RMA, am acting as Location manager for [employer], FNP.   I have reviewed the above documentation for accuracy and completeness, and I agree with the above. -  Georgianne Fick, FNP

## 2021-03-22 ENCOUNTER — Other Ambulatory Visit (HOSPITAL_COMMUNITY): Payer: Self-pay

## 2021-03-22 DIAGNOSIS — H524 Presbyopia: Secondary | ICD-10-CM | POA: Diagnosis not present

## 2021-03-23 ENCOUNTER — Other Ambulatory Visit (HOSPITAL_COMMUNITY): Payer: Self-pay

## 2021-03-24 NOTE — Progress Notes (Signed)
Cardiology Office Note    Date:  04/07/2021   ID:  Rowe, Deanna Nov 13, 1955, MRN [id number]   PCP:  Clayborn Heron, MD   [facility]  Cardiologist:  Lance Muss, MD   Advanced Practice Provider:  No care team member to display Electrophysiologist:  None   [id number]   Chief Complaint  Patient presents with   Follow-up    History of Present Illness:  Deanna Rowe is a 65 y.o. female with history of palpitations, PVC's,PAC's, ETT negative 2017, Parkinson's  Last saw Dr. Eldridge Rowe 01/2020 and had DOE could be anginal equivalent vs deconditioning. Chest tightness. Coronary CTA calcium score 0, normal coronary arteries.  Patient comes in for f/u. Denies chest pain, palpitations. Has some DOE if rushing but overall much better. She slows down. Hasn't had a fall in a month which is good for her.HR has been in 70-80's since on Inderal.   Past Medical History:  Diagnosis Date   Anemia    Anxiety    Arthritis    L HIP   Asthma    Avascular necrosis of hip (HCC)    LEFT   Back pain    Chest pain    Constipation    Depression    Diabetes mellitus    Diabetes mellitus, type II (HCC)    Dry cough    Edema, lower extremity    Gait abnormality 11/18/2019   GERD (gastroesophageal reflux disease)    High cholesterol    Hypertension    Insomnia    takes Ambien nightly   Joint pain    Kidney disease    Kidney disease    Obesity    Parkinsonism (HCC) 03/02/2017   PONV (postoperative nausea and vomiting)    RLS (restless legs syndrome) 01/15/2018   Sleep apnea    Swallowing difficulty    Thyroid disease    TIA (transient ischemic attack) 2012   no residual problems   Urgency incontinence    Vitamin D deficiency     Past Surgical History:  Procedure Laterality Date   DILATION AND CURETTAGE OF UTERUS     ESOPHAGEAL MANOMETRY N/A 09/03/2012   Procedure: ESOPHAGEAL MANOMETRY (EM);  Surgeon: Charolett Bumpers, MD;  Location: [facility]  ENDOSCOPY;  Service: Endoscopy;  Laterality: N/A;   ESOPHAGEAL MANOMETRY N/A 06/19/2017   Procedure: ESOPHAGEAL MANOMETRY (EM);  Surgeon: Vida Rigger, MD;  Location: [facility] ENDOSCOPY;  Service: Endoscopy;  Laterality: N/A;   EXPLORATORY LAPAROTOMY     GANGLION CYST EXCISION     JOINT REPLACEMENT  2011   rt total hip   KNEE ARTHROSCOPY     PILONIDAL CYST / SINUS EXCISION     TOTAL HIP ARTHROPLASTY Left 10/09/2013   Procedure: LEFT TOTAL HIP ARTHROPLASTY ANTERIOR APPROACH;  Surgeon: Loanne Drilling, MD;  Location: [facility] ORS;  Service: Orthopedics;  Laterality: Left;    Current Medications: Current Meds  Medication Sig   ACCU-CHEK FASTCLIX LANCETS MISC USE TO CHECK BLOOD SUGAR 3 TIMES A DAY   ACCU-CHEK GUIDE test strip USE TO CHECK BLOOD SUGAR 3 TIMES PER DAY. (Patient taking differently: Use to check blood sugar 4 times per day.)   albuterol (VENTOLIN HFA) 108 (90 Base) MCG/ACT inhaler Inhale 2 puffs into the lungs as needed for wheezing or shortness of breath.   amLODipine (NORVASC) 5 MG tablet TAKE ONE TABLET BY MOUTH DAILY.   aspirin EC 81 MG tablet Take 81 mg by mouth every evening.  atorvastatin (LIPITOR) 10 MG tablet TAKE 1 TABLET BY MOUTH AT BEDTIME   azelastine (ASTELIN) 0.1 % nasal spray Place 2 sprays into both nostrils 2 (two) times daily as needed.   brexpiprazole (REXULTI) 2 MG TABS tablet Take 1 tablet by mouth every morning   buPROPion (WELLBUTRIN XL) 150 MG 24 hr tablet Take 3 tablets by mouth every morning   Calcium Carbonate-Vitamin D 600-400 MG-UNIT tablet Take 1 tablet by mouth daily.   carbidopa-levodopa (SINEMET CR) 50-200 MG tablet TAKE 1 TABLET BY MOUTH 5 (FIVE) TIMES DAILY.   carbidopa-levodopa (SINEMET) 25-250 MG tablet Take 1 tablet by mouth in the morning and at 2 PM   COVID-19 At Home Antigen Test (CARESTART COVID-19 HOME TEST) KIT Use as directed   COVID-19 mRNA bivalent vaccine, Pfizer, (PFIZER COVID-19 VAC BIVALENT) injection Inject into the muscle.   EPINEPHrine  (EPIPEN 2-PAK) 0.3 mg/0.3 mL IJ SOAJ injection Inject 0.3 mg into the muscle as needed for anaphylaxis.   gabapentin (NEURONTIN) 300 MG capsule TAKE 1 CAPSULE BY MOUTH TWICE DAILY   glucose blood test strip CHECK BLOOD SUGAR 4 TIMES DAILY   influenza vaccine adjuvanted (FLUAD) 0.5 ML injection Inject into the muscle.   insulin lispro (HUMALOG) 100 UNIT/ML injection SQ - 14 units at breakfast, 12 units at lunch, 16 units at supper   Insulin Pen Needle 32G X 4 MM MISC USE AS DIRECTED   Lancets (FREESTYLE) lancets USE TO CHECK BLOOD SUGAR 4 TIMES DAILY FOR 90 DAYS   lansoprazole (PREVACID) 15 MG capsule Take 1 capsule (15 mg total) by mouth daily at 12 noon.   levocetirizine (XYZAL) 5 MG tablet Take 1 tablet (5 mg total) by mouth daily as needed.   LORazepam (ATIVAN) 2 MG tablet Take 1 tablet by mouth 3 times a day   losartan (COZAAR) 100 MG tablet Take 1 tablet by mouth once a day   melatonin 5 MG TABS Take 5 mg by mouth at bedtime.   mirabegron ER (MYRBETRIQ) 50 MG TB24 tablet Take 1 tablet by mouth once daily   Olopatadine HCl (PATADAY) 0.2 % SOLN Place 1 drop into both eyes daily as needed.   omalizumab Arvid Right) 150 MG/ML prefilled syringe Inject 300 mg into the skin every 28 (twenty-eight) days.   propranolol (INDERAL) 40 MG tablet TAKE 1 TABLET BY MOUTH TWICE DAILY   rOPINIRole (REQUIP) 2 MG tablet Take 1 tablet by mouth twice a day   Semaglutide, 1 MG/DOSE, (OZEMPIC, 1 MG/DOSE,) 4 MG/3ML SOPN Inject 73m under the skin once weekly   Semaglutide, 1 MG/DOSE, (OZEMPIC, 1 MG/DOSE,) 4 MG/3ML SOPN Inject 1 mg into the skin once a week.   solifenacin (VESICARE) 10 MG tablet TAKE 1 TABLET BY MOUTH ONCE DAILY   triamcinolone (NASACORT) 55 MCG/ACT AERO nasal inhaler Place 2 sprays into the nose daily.   valACYclovir (VALTREX) 500 MG tablet TAKE 1 TABLET BY MOUTH ONCE DAILY   valACYclovir (VALTREX) 500 MG tablet Take 1 tablet by mouth everyday   vitamin C (ASCORBIC ACID) 500 MG tablet Take 500 mg by  mouth daily.   Vitamin D, Ergocalciferol, 2000 units CAPS Take 1 capsule by mouth daily. 2000 units daily.   vortioxetine HBr (TRINTELLIX) 20 MG TABS tablet Take 1 tablet(s) by mouth every morning     Allergies:   Fluticasone-salmeterol, Codeine, Fetzima [levomilnacipran], Nsaids, Trulicity [dulaglutide], Xanax [alprazolam], Amoxicillin, and Pseudoephedrine hcl er   Social History   Socioeconomic History   Marital status: Married    Spouse  name: Simona Huh   Number of children: Not on file   Years of education: Not on file   Highest education level: Not on file  Occupational History   Occupation: retired Therapist, sports  Tobacco Use   Smoking status: Never   Smokeless tobacco: Never  Vaping Use   Vaping Use: Never used  Substance and Sexual Activity   Alcohol use: No    Alcohol/week: 0.0 standard drinks   Drug use: No   Sexual activity: Not Currently  Other Topics Concern   Not on file  Social History Narrative   Lives with husband   Caffeine use: none   Right handed    Social Determinants of Health   Financial Resource Strain: Not on file  Food Insecurity: Not on file  Transportation Needs: Not on file  Physical Activity: Not on file  Stress: Not on file  Social Connections: Not on file     Family History:  The patient's  family history includes Alcohol abuse in her brother, brother, brother, and brother; Alcoholism in her father; Allergic rhinitis in her sister; Heart disease in her father and mother; High blood pressure in her mother; Hyperlipidemia in her mother.   ROS:   Please see the history of present illness.    ROS All other systems reviewed and are negative.   PHYSICAL EXAM:   VS:  BP 132/74    Pulse 90    Ht _0  (1.676 m)    Wt 218 lb 6.4 oz (99.1 kg)    SpO2 97%    BMI 35.25 kg/m   Physical Exam  GEN: Well nourished, well developed, in no acute distress  Neck: enlarged thyroid-2 known nodules, no JVD, carotid bruits  Cardiac:RRR; no murmurs, rubs, or gallops   Respiratory:  clear to auscultation bilaterally, normal work of breathing GI: soft, nontender, nondistended, + BS Ext: without cyanosis, clubbing, or edema, Good distal pulses bilaterally Neuro:  Alert and Oriented x 3, Psych: euthymic mood, full affect  Wt Readings from Last 3 Encounters:  04/07/21 218 lb 6.4 oz (99.1 kg)  03/17/21 211 lb (95.7 kg)  02/01/21 211 lb (95.7 kg)      Studies/Labs Reviewed:   EKG:  EKG is  ordered today.  The ekg ordered today demonstrates NSR normal EKG  Recent Labs: No results found for requested labs within last 8760 hours.   Lipid Panel    Component Value Date/Time   CHOL 155 04/16/2019 1451   TRIG 71 04/16/2019 1451   HDL 84 04/16/2019 1451   LDLCALC 57 04/16/2019 1451    Additional studies/ records that were reviewed today include:  Coronary 03/04/20 FINDINGS: Coronary calcium score: The patient's coronary artery calcium score is 0, which places the patient in the 0 percentile.   Coronary arteries: Normal coronary origins.  Right dominance.   Right Coronary Artery: Normal caliber vessel, gives rise to PDA. No significant plaque or stenosis.   Left Main Coronary Artery: Normal caliber vessel. No significant plaque or stenosis.   Left Anterior Descending Coronary Artery: Normal caliber vessel. No significant plaque or stenosis. Gives rise to 2 small diagonal branches. Distal LAD wraps apex.   Left Circumflex Artery: Normal caliber vessel. No significant plaque or stenosis. Gives rise to 2 OM branches.   Aorta: Normal size, 31 mm at the mid ascending aorta (level of the PA bifurcation) measured double oblique. Trivial calcifications. No dissection.   Aortic Valve: No calcifications. Trileaflet.   Other findings:   Normal pulmonary vein  drainage into the left atrium.   Normal left atrial appendage without a thrombus.   Normal size of the pulmonary artery.   IMPRESSION: 1. No evidence of CAD, CADRADS = 0.   2. Coronary  calcium score of 0. This was 0 percentile for age and sex matched control.   3. Normal coronary origin with right dominance.     Electronically Signed   By: Buford Dresser M.D.   On: 03/04/2020 17:58     Risk Assessment/Calculations:         ASSESSMENT:    1. Precordial pain   2. PVC (premature ventricular contraction)   3. Mixed hyperlipidemia   4. Chronic diastolic heart failure (HCC)      PLAN:  In order of problems listed above:  History of chest pain/dyspnea calcium score 0, normal coronary arteries on CT 03/2020-no further chest pain. DOE improved  Palpitations with PVC's/PAC's-improved on propanolol  HLD-LDL 42 12/2020 on atorvastatin  Chronic diastolic CHF-compensated  Shared Decision Making/Informed Consent        Medication Adjustments/Labs and Tests Ordered: Current medicines are reviewed at length with the patient today.  Concerns regarding medicines are outlined above.  Medication changes, Labs and Tests ordered today are listed in the Patient Instructions below. Patient Instructions  Medication Instructions:  Your physician recommends that you continue on your current medications as directed. Please refer to the Current Medication list given to you today.  *If you need a refill on your cardiac medications before your next appointment, please call your pharmacy*   Lab Work: One ordered  If you have labs (blood work) drawn today and your tests are completely normal, you will receive your results only by: [place] (if you have MyChart) OR A paper copy in the mail If you have any lab test that is abnormal or we need to change your treatment, we will call you to review the results.   Testing/Procedures: None ordered   Follow-Up: At [facility], you and your health needs are our priority.  As part of our continuing mission to provide you with exceptional heart care, we have created designated Provider Care Teams.  These Care  Teams include your primary Cardiologist (physician) and Advanced Practice Providers (APPs -  Physician Assistants and Nurse Practitioners) who all work together to provide you with the care you need, when you need it.  We recommend signing up for the patient portal called "MyChart".  Sign up information is provided on this After Visit Summary.  MyChart is used to connect with patients for Virtual Visits (Telemedicine).  Patients are able to view lab/test results, encounter notes, upcoming appointments, etc.  Non-urgent messages can be sent to your provider as well.   To learn more about what you can do with MyChart, go to [email or web address].    Your next appointment:   12 month(s)  The format for your next appointment:   In Person  Provider:   Larae Grooms, MD  or Ermalinda Barrios, PA-C         Other Instructions     Signed, Ermalinda Barrios, PA-C  04/07/2021 7:58 AM    [address], [place], [place]  [address] Phone: [phone]; Fax: [phone]

## 2021-03-25 ENCOUNTER — Other Ambulatory Visit (HOSPITAL_BASED_OUTPATIENT_CLINIC_OR_DEPARTMENT_OTHER): Payer: Self-pay

## 2021-03-30 ENCOUNTER — Other Ambulatory Visit: Payer: Self-pay

## 2021-03-30 ENCOUNTER — Encounter: Payer: Self-pay | Admitting: Family Medicine

## 2021-03-30 ENCOUNTER — Ambulatory Visit (INDEPENDENT_AMBULATORY_CARE_PROVIDER_SITE_OTHER): Payer: 59 | Admitting: Family Medicine

## 2021-03-30 VITALS — BP 118/72 | HR 82 | Temp 97.7°F | Resp 21

## 2021-03-30 DIAGNOSIS — H1013 Acute atopic conjunctivitis, bilateral: Secondary | ICD-10-CM

## 2021-03-30 DIAGNOSIS — J4540 Moderate persistent asthma, uncomplicated: Secondary | ICD-10-CM | POA: Diagnosis not present

## 2021-03-30 DIAGNOSIS — K219 Gastro-esophageal reflux disease without esophagitis: Secondary | ICD-10-CM

## 2021-03-30 DIAGNOSIS — J3089 Other allergic rhinitis: Secondary | ICD-10-CM

## 2021-03-30 NOTE — Patient Instructions (Addendum)
Asthma Increase Symbicort 160/4.5 mcg to-2 puffs twice a day with a spacer to prevent cough or wheeze Continue montelukast 10 mg once a day to prevent cough or wheeze Continue albuterol 2 puffs every 4 hours as needed for cough or wheeze Continue Xolair injections 300 mg once every 4 weeks and have access to an epinephrine auto-injector set.  Allergic rhinitis Continue Nasacort 2 sprays in each nostril once a day as needed for stuffy nose Continue saline nasal rinses as needed for nasal symptoms. Use this before any medicated nasal sprays for best result Continue azelastine nasal spray 2 sprays in each nostril twice a day as needed for nasal symptoms Continue Xyzal 5 mg once a day only as needed for a runny nose or itch Continue allergen avoidance measures directed toward dust mites, cat, and dog as listed below For thick post nasal drainage, begin Mucinex [phone] mg twice a day and increase your fluid intake in order to thin out mucus  Allergic conjunctivitis Some over the counter eye drops include Pataday one drop in each eye once a day as needed for red, itchy eyes OR Zaditor one drop in each eye twice a day as needed for red itchy eyes  Reflux Continue Pepcid as previously prescribed Continue dietary and lifestyle modifications   Please let us know if this treatment plan is not working well for you  Follow up in 6 months or sooner if needed.  Control of Dog or Cat Allergen Avoidance is the best way to manage a dog or cat allergy. If you have a dog or cat and are allergic to dog or cats, consider removing the dog or cat from the home. If you have a dog or cat but dont want to find it a new home, or if your family wants a pet even though someone in the household is allergic, here are some strategies that may help keep symptoms at bay:  Keep the pet out of your bedroom and restrict it to only a few rooms. Be advised that keeping the dog or cat in only one room will not limit the  allergens to that room. Dont pet, hug or kiss the dog or cat; if you do, wash your hands with soap and water. High-efficiency particulate air (HEPA) cleaners run continuously in a bedroom or living room can reduce allergen levels over time. Regular use of a high-efficiency vacuum cleaner or a central vacuum can reduce allergen levels. Giving your dog or cat a bath at least once a week can reduce airborne allergen.   Control of Dust Mite Allergen Dust mites play a major role in allergic asthma and rhinitis. They occur in environments with high humidity wherever human skin is found. Dust mites absorb humidity from the atmosphere (ie, they do not drink) and feed on organic matter (including shed human and animal skin). Dust mites are a microscopic type of insect that you cannot see with the naked eye. High levels of dust mites have been detected from mattresses, pillows, carpets, upholstered furniture, bed covers, clothes, soft toys and any woven material. The principal allergen of the dust mite is found in its feces. A gram of dust may contain 1,000 mites and 250,000 fecal particles. Mite antigen is easily measured in the air during house cleaning activities. Dust mites do not bite and do not cause harm to humans, other than by triggering allergies/asthma.  Ways to decrease your exposure to dust mites in your home:  1. Encase mattresses, box springs and  pillows with a mite-impermeable barrier or cover  2. Wash sheets, blankets and drapes weekly in hot water (130 F) with detergent and dry them in a dryer on the hot setting.  3. Have the room cleaned frequently with a vacuum cleaner and a damp dust-mop. For carpeting or rugs, vacuuming with a vacuum cleaner equipped with a high-efficiency particulate air (HEPA) filter. The dust mite allergic individual should not be in a room which is being cleaned and should wait 1 hour after cleaning before going into the room.  4. Do not sleep on upholstered  furniture (eg, couches).  5. If possible removing carpeting, upholstered furniture and drapery from the home is ideal. Horizontal blinds should be eliminated in the rooms where the person spends the most time (bedroom, study, television room). Washable vinyl, roller-type shades are optimal.  6. Remove all non-washable stuffed toys from the bedroom. Wash stuffed toys weekly like sheets and blankets above.  7. Reduce indoor humidity to less than 50%. Inexpensive humidity monitors can be purchased at most hardware stores. Do not use a humidifier as can make the problem worse and are not recommended.

## 2021-03-30 NOTE — Progress Notes (Signed)
[name] [id number] Dept: [id number]  FOLLOW UP NOTE  Patient ID: Deanna Rowe, female    DOB: 1955/09/02  Age: 65 y.o. MRN: [id number] Date of Office Visit: 03/30/2021  Assessment  Chief Complaint: Asthma  HPI Deanna Rowe is a 65 year old female who presents the clinic for follow-up visit.  She was last seen in this clinic on 01/27/2021 by Gareth Morgan, FNP, for evaluation of asthma, allergic rhinitis, allergic conjunctivitis, and reflux.  She is accompanied by her husband who assists with history.  At today's visit, she reports her asthma has been moderately well controlled with symptoms including occasional shortness of breath while lying down, occasional wheezing at night while lying down, and occasional cough which is worse at night.  She is not currently taking montelukast and is using Symbicort 160-2 puffs once a day.  She reports using albuterol about once a week with relief of symptoms.  She continues Xolair 300 mg injections once every 4 weeks at home with no large or local reactions.  She reports a significant decrease in her symptoms of asthma while continuing on Xolair injections.  She does report that she is not able to use CPAP with the amount of coughing she has been experiencing over the last 2 months.  Reflux is reported as poorly controlled with heartburn occurring nightly.  She continues Pepcid as previously prescribed and takes Tums as needed with relief of symptoms.  Allergic rhinitis is reported as moderately well controlled with symptoms including nasal congestion and sneezing.  She denies nasal drainage and postnasal drainage.  She continues Xyzal, Nasacort, and azelastine as needed and is not currently using a saline nasal rinse or Mucinex.  Allergic conjunctivitis is reported as moderately well controlled with occasional clear discharge for which she is not currently using any medical intervention.  Her current medications are listed in the  chart. Of note, Zykera and her husband state that they are pleased with the current medical regimen and are not interested in making changes at this time.   Drug Allergies:  Allergies  Allergen Reactions   Fluticasone-Salmeterol Anaphylaxis   Codeine Nausea And Vomiting   Fetzima [Levomilnacipran] Nausea And Vomiting   Nsaids Other (See Comments)    Upset stomach    Trulicity [Dulaglutide] Nausea And Vomiting    Significant and undesirably rapid (40 lbs) weight loss    Xanax [Alprazolam] Other (See Comments)    disoriented   Amoxicillin Rash    Has patient had a PCN reaction causing immediate rash, facial/tongue/throat swelling, SOB or lightheadedness with hypotension: Yes Has patient had a PCN reaction causing severe rash involving mucus membranes or skin necrosis: No Has patient had a PCN reaction that required hospitalization: No Has patient had a PCN reaction occurring within the last 10 years: No If all of the above answers are "NO", then may proceed with Cephalosporin use.    Pseudoephedrine Hcl Er Palpitations    Physical Exam: BP 118/72    Pulse 82    Temp 97.7 F (36.5 C) (Temporal)    Resp (!) 21    SpO2 98%    Physical Exam Vitals reviewed.  Constitutional:      Appearance: Normal appearance.  HENT:     Head: Normocephalic and atraumatic.     Right Ear: Tympanic membrane normal.     Left Ear: Tympanic membrane normal.     Nose:     Comments: Bilateral nares slightly erythematous with clear nasal drainage  noted.  Pharynx normal.  Ears normal.  Eyes normal.    Mouth/Throat:     Pharynx: Oropharynx is clear.  Eyes:     Conjunctiva/sclera: Conjunctivae normal.  Cardiovascular:     Rate and Rhythm: Normal rate and regular rhythm.     Heart sounds: Normal heart sounds. No murmur heard. Pulmonary:     Effort: Pulmonary effort is normal.     Breath sounds: Normal breath sounds.     Comments: Lungs clear to auscultation Musculoskeletal:        General: Normal  range of motion.     Cervical back: Normal range of motion and neck supple.  Skin:    General: Skin is warm and dry.  Neurological:     Mental Status: She is alert and oriented to person, place, and time.  Psychiatric:        Mood and Affect: Mood normal.        Behavior: Behavior normal.        Thought Content: Thought content normal.        Judgment: Judgment normal.    Diagnostics: FVC 1.20, FEV1 1.03.  Predicted FVC 2.73, predicted FEV1 2.14.  Spirometry indicates restriction.  Poor testing technique.  Assessment and Plan: 1. Not well controlled moderate persistent asthma   2. Perennial allergic rhinitis   3. Allergic conjunctivitis of both eyes   4. Gastroesophageal reflux disease, unspecified whether esophagitis present     Patient Instructions  Asthma Increase Symbicort 160/4.5 mcg to-2 puffs twice a day with a spacer to prevent cough or wheeze Continue montelukast 10 mg once a day to prevent cough or wheeze Continue albuterol 2 puffs every 4 hours as needed for cough or wheeze Continue Xolair injections 300 mg once every 4 weeks and have access to an epinephrine auto-injector set.  Allergic rhinitis Continue Nasacort 2 sprays in each nostril once a day as needed for stuffy nose Continue saline nasal rinses as needed for nasal symptoms. Use this before any medicated nasal sprays for best result Continue azelastine nasal spray 2 sprays in each nostril twice a day as needed for nasal symptoms Continue Xyzal 5 mg once a day only as needed for a runny nose or itch Continue allergen avoidance measures directed toward dust mites, cat, and dog as listed below For thick post nasal drainage, begin Mucinex [phone] mg twice a day and increase your fluid intake in order to thin out mucus  Allergic conjunctivitis Some over the counter eye drops include Pataday one drop in each eye once a day as needed for red, itchy eyes OR Zaditor one drop in each eye twice a day as needed for red  itchy eyes  Reflux Continue Pepcid as previously prescribed Continue dietary and lifestyle modifications   Please let us know if this treatment plan is not working well for you  Follow up in 6 months or sooner if needed.   Return in about 6 months (around 09/28/2021), or if symptoms worsen or fail to improve.    Thank you for the opportunity to care for this patient.  Please do not hesitate to contact me with questions.  Gareth Morgan, FNP [facility]

## 2021-03-31 ENCOUNTER — Encounter: Payer: Self-pay | Admitting: Family Medicine

## 2021-03-31 ENCOUNTER — Other Ambulatory Visit (HOSPITAL_COMMUNITY): Payer: Self-pay

## 2021-03-31 MED ORDER — CARESTART COVID-19 HOME TEST VI KIT
PACK | 0 refills | Status: DC
  Filled 2021-03-31: qty 4, 4d supply, fill #0

## 2021-04-05 ENCOUNTER — Other Ambulatory Visit: Payer: Self-pay | Admitting: Interventional Cardiology

## 2021-04-05 ENCOUNTER — Other Ambulatory Visit (HOSPITAL_BASED_OUTPATIENT_CLINIC_OR_DEPARTMENT_OTHER): Payer: Self-pay

## 2021-04-05 ENCOUNTER — Other Ambulatory Visit: Payer: Self-pay | Admitting: Family

## 2021-04-05 MED ORDER — LANSOPRAZOLE 15 MG PO CPDR
15.0000 mg | DELAYED_RELEASE_CAPSULE | Freq: Every day | ORAL | 0 refills | Status: DC
  Filled 2021-04-05: qty 30, 30d supply, fill #0

## 2021-04-05 MED ORDER — VALACYCLOVIR HCL 500 MG PO TABS
ORAL_TABLET | ORAL | 0 refills | Status: DC
  Filled 2021-04-05: qty 90, 90d supply, fill #0

## 2021-04-05 MED FILL — Solifenacin Succinate Tab 10 MG: ORAL | 30 days supply | Qty: 30 | Fill #9 | Status: AC

## 2021-04-05 NOTE — Telephone Encounter (Signed)
Was she taking lansoprazole of pepcid?

## 2021-04-05 NOTE — Telephone Encounter (Signed)
Per Anne's last note on 03/30/21 she was taking Pepcid. Please call the patient and see what she is taking.

## 2021-04-06 ENCOUNTER — Other Ambulatory Visit (HOSPITAL_BASED_OUTPATIENT_CLINIC_OR_DEPARTMENT_OTHER): Payer: Self-pay

## 2021-04-06 ENCOUNTER — Other Ambulatory Visit: Payer: Self-pay | Admitting: Family Medicine

## 2021-04-06 DIAGNOSIS — Z1231 Encounter for screening mammogram for malignant neoplasm of breast: Secondary | ICD-10-CM

## 2021-04-06 MED ORDER — PROPRANOLOL HCL 40 MG PO TABS
ORAL_TABLET | Freq: Two times a day (BID) | ORAL | 0 refills | Status: DC
  Filled 2021-04-06: qty 60, 30d supply, fill #0

## 2021-04-06 MED ORDER — GABAPENTIN 300 MG PO CAPS
300.0000 mg | ORAL_CAPSULE | Freq: Two times a day (BID) | ORAL | 1 refills | Status: DC
  Filled 2021-04-06: qty 180, 90d supply, fill #0
  Filled 2021-07-05: qty 180, 90d supply, fill #1

## 2021-04-07 ENCOUNTER — Ambulatory Visit (INDEPENDENT_AMBULATORY_CARE_PROVIDER_SITE_OTHER): Payer: 59 | Admitting: Physician Assistant

## 2021-04-07 ENCOUNTER — Other Ambulatory Visit: Payer: Self-pay

## 2021-04-07 ENCOUNTER — Other Ambulatory Visit (HOSPITAL_BASED_OUTPATIENT_CLINIC_OR_DEPARTMENT_OTHER): Payer: Self-pay

## 2021-04-07 ENCOUNTER — Encounter: Payer: Self-pay | Admitting: Physician Assistant

## 2021-04-07 VITALS — BP 132/74 | HR 90 | Ht 66.0 in | Wt 218.4 lb

## 2021-04-07 DIAGNOSIS — I493 Ventricular premature depolarization: Secondary | ICD-10-CM

## 2021-04-07 DIAGNOSIS — E782 Mixed hyperlipidemia: Secondary | ICD-10-CM | POA: Diagnosis not present

## 2021-04-07 DIAGNOSIS — R072 Precordial pain: Secondary | ICD-10-CM

## 2021-04-07 DIAGNOSIS — I5032 Chronic diastolic (congestive) heart failure: Secondary | ICD-10-CM

## 2021-04-07 NOTE — Patient Instructions (Signed)
Medication Instructions:  Your physician recommends that you continue on your current medications as directed. Please refer to the Current Medication list given to you today.  *If you need a refill on your cardiac medications before your next appointment, please call your pharmacy*   Lab Work: One ordered  If you have labs (blood work) drawn today and your tests are completely normal, you will receive your results only by: [place] (if you have MyChart) OR A paper copy in the mail If you have any lab test that is abnormal or we need to change your treatment, we will call you to review the results.   Testing/Procedures: None ordered   Follow-Up: At [facility], you and your health needs are our priority.  As part of our continuing mission to provide you with exceptional heart care, we have created designated Provider Care Teams.  These Care Teams include your primary Cardiologist (physician) and Advanced Practice Providers (APPs -  Physician Assistants and Nurse Practitioners) who all work together to provide you with the care you need, when you need it.  We recommend signing up for the patient portal called "MyChart".  Sign up information is provided on this After Visit Summary.  MyChart is used to connect with patients for Virtual Visits (Telemedicine).  Patients are able to view lab/test results, encounter notes, upcoming appointments, etc.  Non-urgent messages can be sent to your provider as well.   To learn more about what you can do with MyChart, go to [email or web address].    Your next appointment:   12 month(s)  The format for your next appointment:   In Person  Provider:   Larae Grooms, MD  or Ermalinda Barrios, PA-C         Other Instructions

## 2021-04-08 ENCOUNTER — Other Ambulatory Visit (HOSPITAL_BASED_OUTPATIENT_CLINIC_OR_DEPARTMENT_OTHER): Payer: Self-pay

## 2021-04-09 ENCOUNTER — Other Ambulatory Visit (HOSPITAL_BASED_OUTPATIENT_CLINIC_OR_DEPARTMENT_OTHER): Payer: Self-pay

## 2021-04-12 ENCOUNTER — Other Ambulatory Visit (HOSPITAL_BASED_OUTPATIENT_CLINIC_OR_DEPARTMENT_OTHER): Payer: Self-pay

## 2021-04-12 MED ORDER — ATORVASTATIN CALCIUM 10 MG PO TABS
10.0000 mg | ORAL_TABLET | Freq: Every day | ORAL | 3 refills | Status: DC
  Filled 2021-04-12: qty 90, 90d supply, fill #0
  Filled 2021-07-19: qty 90, 90d supply, fill #1
  Filled 2021-11-17: qty 90, 90d supply, fill #2
  Filled 2022-02-22: qty 90, 90d supply, fill #3

## 2021-04-14 ENCOUNTER — Ambulatory Visit (INDEPENDENT_AMBULATORY_CARE_PROVIDER_SITE_OTHER): Payer: 59 | Admitting: Family Medicine

## 2021-04-14 ENCOUNTER — Other Ambulatory Visit (HOSPITAL_BASED_OUTPATIENT_CLINIC_OR_DEPARTMENT_OTHER): Payer: Self-pay

## 2021-04-14 DIAGNOSIS — F251 Schizoaffective disorder, depressive type: Secondary | ICD-10-CM | POA: Diagnosis not present

## 2021-04-14 MED ORDER — LORAZEPAM 2 MG PO TABS
2.0000 mg | ORAL_TABLET | Freq: Three times a day (TID) | ORAL | 1 refills | Status: DC
  Filled 2021-04-14 – 2021-07-19 (×2): qty 270, 90d supply, fill #0
  Filled 2021-09-29 – 2021-10-04 (×2): qty 270, 90d supply, fill #1
  Filled ????-??-??: fill #1

## 2021-04-14 MED ORDER — REXULTI 2 MG PO TABS
2.0000 mg | ORAL_TABLET | Freq: Every morning | ORAL | 1 refills | Status: DC
  Filled 2021-04-14 – 2021-05-31 (×2): qty 90, 90d supply, fill #0
  Filled 2021-09-02: qty 90, 90d supply, fill #1

## 2021-04-14 MED ORDER — TRINTELLIX 20 MG PO TABS
ORAL_TABLET | ORAL | 1 refills | Status: DC
  Filled 2021-04-14 – 2021-05-07 (×2): qty 90, 90d supply, fill #0
  Filled 2021-08-03: qty 90, 90d supply, fill #1

## 2021-04-14 MED ORDER — ZOLPIDEM TARTRATE 5 MG PO TABS
5.0000 mg | ORAL_TABLET | Freq: Every day | ORAL | 1 refills | Status: DC
  Filled 2021-04-14: qty 90, 90d supply, fill #0
  Filled 2021-07-05: qty 90, 90d supply, fill #1

## 2021-04-14 MED ORDER — ROPINIROLE HCL 2 MG PO TABS
2.0000 mg | ORAL_TABLET | Freq: Two times a day (BID) | ORAL | 1 refills | Status: DC
  Filled 2021-04-14 – 2021-07-05 (×2): qty 180, 90d supply, fill #0
  Filled 2021-09-29: qty 180, 90d supply, fill #1

## 2021-04-14 MED ORDER — BUPROPION HCL ER (XL) 150 MG PO TB24
450.0000 mg | ORAL_TABLET | Freq: Every morning | ORAL | 1 refills | Status: DC
  Filled 2021-04-14: qty 270, 90d supply, fill #0
  Filled 2021-07-19: qty 270, 90d supply, fill #1

## 2021-04-15 ENCOUNTER — Ambulatory Visit (INDEPENDENT_AMBULATORY_CARE_PROVIDER_SITE_OTHER): Payer: 59 | Admitting: Family Medicine

## 2021-04-15 ENCOUNTER — Encounter (INDEPENDENT_AMBULATORY_CARE_PROVIDER_SITE_OTHER): Payer: Self-pay | Admitting: Family Medicine

## 2021-04-15 ENCOUNTER — Other Ambulatory Visit: Payer: Self-pay

## 2021-04-15 VITALS — BP 110/69 | HR 71 | Temp 97.3°F | Ht 66.0 in | Wt 216.0 lb

## 2021-04-15 DIAGNOSIS — G20 Parkinson's disease: Secondary | ICD-10-CM

## 2021-04-15 DIAGNOSIS — N1832 Chronic kidney disease, stage 3b: Secondary | ICD-10-CM

## 2021-04-15 DIAGNOSIS — E1122 Type 2 diabetes mellitus with diabetic chronic kidney disease: Secondary | ICD-10-CM | POA: Diagnosis not present

## 2021-04-15 DIAGNOSIS — Z794 Long term (current) use of insulin: Secondary | ICD-10-CM | POA: Diagnosis not present

## 2021-04-15 DIAGNOSIS — Z6834 Body mass index (BMI) 34.0-34.9, adult: Secondary | ICD-10-CM

## 2021-04-15 DIAGNOSIS — G20A1 Parkinson's disease without dyskinesia, without mention of fluctuations: Secondary | ICD-10-CM

## 2021-04-15 NOTE — Progress Notes (Signed)
Chief Complaint:   OBESITY Deanna Rowe is here to discuss her progress with her obesity treatment plan along with follow-up of her obesity related diagnoses. Deanna Rowe is on the Category 3 Plan and states she is following her eating plan approximately 50% of the time. Deanna Rowe states she is using a stationary bike for 15 minutes 7 times per week.  Today's visit was #: 30 Starting weight: 237 lbs Starting date: 04/16/2019 Today's weight: 216 lbs Today's date:04/15/2021 Total lbs lost to date: 21 lbs Total lbs lost since last in-office visit: +5  Interim History: Deanna Rowe notes she was off plan over Christmas. She is not skipping meals. She is eating the prescribed protein. She has protein cereal at breakfast, [place] sandwich at lunch and baked chicken or fish for dinner with vegetables. Her snacking at night is well controlled. She eats out twice a week with husband. She is increasing duration of time on stationary bike.   Subjective:   1. Type 2 diabetes mellitus with stage 3b chronic kidney disease, with long-term current use of insulin (HCC) Deanna Rowe's Type 2 diabetes mellitus is well controlled. She is on Humalog sliding scale. Her CBGs range from low 100s to 170s. She had one low of 57 when she didn't eat on time. Her last A1C was 6.4 (Nov 23) at Dr. Cindra Eves office.  She is taking sliding scale Humalog QID, Basaglar, and Ozempic. She does not feel  the Ozempic helps as much as it used to for appetite supression.   Lab Results  Component Value Date   HGBA1C 7.9 (H) 04/16/2019   HGBA1C 6.9 11/25/2016   HGBA1C 6.0 06/10/2016   Lab Results  Component Value Date   MICROALBUR <0.7 02/12/2016   LDLCALC 57 04/16/2019   CREATININE 1.43 (H) 03/02/2020   Lab Results  Component Value Date   INSULIN 12.3 04/16/2019    2. Parkinson disease ([place]) Nysha sees Dr. Jannifer Franklin (neurology). She notes she has fallen twice recently. She is compliant with all medications.   Assessment/Plan:   1.  Type 2 diabetes mellitus with stage 3b chronic kidney disease, with long-term current use of insulin (HCC) Leonie will continue all medications. She will follow up with Dr. Buddy Duty in February and ask about increasing Ozempic to 2 mg weekly.  2. Parkinson disease ([place]) [place]ie will follow up with neurology on Aug 05, 2021.  3. Obesity: BMI 34.88 Deanna Rowe is currently in the action stage of change. As such, her goal is to continue with weight loss efforts. She has agreed to the Category 3 Plan.   Exercise goals:  As is.  Behavioral modification strategies: decreasing liquid calories.  Karri has agreed to follow-up with our clinic in 3-4 weeks.  Objective:   Blood pressure 110/69, pulse 71, temperature (!) 97.3 F (36.3 C), height 5\' 6"  (1.676 m), weight 216 lb (98 kg), SpO2 99 %. Body mass index is 34.86 kg/m.  General: Cooperative, alert, well developed, in no acute distress. HEENT: Conjunctivae and lids unremarkable. Cardiovascular: Regular rhythm.  Lungs: Normal work of breathing. Neurologic: No focal deficits.   Lab Results  Component Value Date   CREATININE 1.43 (H) 03/02/2020   BUN 13 03/02/2020   NA 136 03/02/2020   K 4.7 03/02/2020   CL 98 03/02/2020   CO2 27 03/02/2020   Lab Results  Component Value Date   ALT 21 04/16/2019   AST 23 04/16/2019   ALKPHOS 58 04/16/2019   BILITOT 0.3 04/16/2019   Lab Results  Component  Value Date   HGBA1C 7.9 (H) 04/16/2019   HGBA1C 6.9 11/25/2016   HGBA1C 6.0 06/10/2016   HGBA1C 5.9 02/12/2016   HGBA1C 6.1 12/01/2015   Lab Results  Component Value Date   INSULIN 12.3 04/16/2019   Lab Results  Component Value Date   TSH 1.780 04/16/2019   Lab Results  Component Value Date   CHOL 155 04/16/2019   HDL 84 04/16/2019   LDLCALC 57 04/16/2019   TRIG 71 04/16/2019   Lab Results  Component Value Date   VD25OH 44.8 12/17/2019   VD25OH 40.9 04/16/2019   Lab Results  Component Value Date   WBC 6.8 04/16/2019   HGB  12.3 04/16/2019   HCT 36.1 04/16/2019   MCV 96 04/16/2019   PLT 274 04/16/2019   No results found for: IRON, TIBC, FERRITIN  Attestation Statements:   Reviewed by clinician on day of visit: allergies, medications, problem list, medical history, surgical history, family history, social history, and previous encounter notes.  Time spent on visit including pre-visit chart review and post-visit care and charting was 31 minutes.   I, Lizbeth Bark, RMA, am acting as Location manager for [employer], [place].  I have reviewed the above documentation for accuracy and completeness, and I agree with the above. -  Georgianne Fick, FNP

## 2021-04-20 ENCOUNTER — Other Ambulatory Visit (HOSPITAL_BASED_OUTPATIENT_CLINIC_OR_DEPARTMENT_OTHER): Payer: Self-pay

## 2021-04-20 MED ORDER — OZEMPIC (2 MG/DOSE) 8 MG/3ML ~~LOC~~ SOPN
PEN_INJECTOR | SUBCUTANEOUS | 3 refills | Status: DC
  Filled 2021-04-20: qty 9, 84d supply, fill #0

## 2021-04-21 ENCOUNTER — Other Ambulatory Visit (HOSPITAL_BASED_OUTPATIENT_CLINIC_OR_DEPARTMENT_OTHER): Payer: Self-pay

## 2021-04-21 MED ORDER — OZEMPIC (2 MG/DOSE) 8 MG/3ML ~~LOC~~ SOPN
2.0000 mg | PEN_INJECTOR | SUBCUTANEOUS | 3 refills | Status: DC
  Filled 2021-04-21: qty 9, 84d supply, fill #0

## 2021-04-23 ENCOUNTER — Other Ambulatory Visit (HOSPITAL_BASED_OUTPATIENT_CLINIC_OR_DEPARTMENT_OTHER): Payer: Self-pay

## 2021-04-26 ENCOUNTER — Other Ambulatory Visit (HOSPITAL_COMMUNITY): Payer: Self-pay

## 2021-05-03 ENCOUNTER — Ambulatory Visit
Admission: RE | Admit: 2021-05-03 | Discharge: 2021-05-03 | Disposition: A | Discharge status: Home / Self Care | Payer: 59 | Source: Ambulatory Visit | Attending: Family Medicine | Admitting: Family Medicine

## 2021-05-03 DIAGNOSIS — Z1231 Encounter for screening mammogram for malignant neoplasm of breast: Secondary | ICD-10-CM | POA: Diagnosis not present

## 2021-05-04 ENCOUNTER — Other Ambulatory Visit (HOSPITAL_COMMUNITY): Payer: Self-pay

## 2021-05-06 ENCOUNTER — Other Ambulatory Visit (HOSPITAL_COMMUNITY): Payer: Self-pay

## 2021-05-07 ENCOUNTER — Other Ambulatory Visit: Payer: Self-pay

## 2021-05-07 ENCOUNTER — Other Ambulatory Visit: Payer: Self-pay | Admitting: Interventional Cardiology

## 2021-05-07 ENCOUNTER — Other Ambulatory Visit (HOSPITAL_BASED_OUTPATIENT_CLINIC_OR_DEPARTMENT_OTHER): Payer: Self-pay

## 2021-05-07 MED ORDER — PROPRANOLOL HCL 40 MG PO TABS
ORAL_TABLET | Freq: Two times a day (BID) | ORAL | 11 refills | Status: DC
  Filled 2021-05-07: qty 60, 30d supply, fill #0
  Filled 2021-05-31: qty 60, 30d supply, fill #1
  Filled 2021-07-08: qty 60, 30d supply, fill #2
  Filled 2021-08-03: qty 60, 30d supply, fill #3
  Filled 2021-09-02: qty 60, 30d supply, fill #4
  Filled 2021-10-03: qty 60, 30d supply, fill #5
  Filled 2021-11-09: qty 60, 30d supply, fill #6
  Filled 2021-12-24: qty 60, 30d supply, fill #7
  Filled 2022-02-02: qty 60, 30d supply, fill #8
  Filled 2022-02-28: qty 60, 30d supply, fill #9
  Filled 2022-04-06: qty 60, 30d supply, fill #10
  Filled 2022-05-05: qty 60, 30d supply, fill #11

## 2021-05-07 MED ORDER — AMLODIPINE BESYLATE 5 MG PO TABS
5.0000 mg | ORAL_TABLET | Freq: Every day | ORAL | 0 refills | Status: DC
  Filled 2021-05-07: qty 90, 90d supply, fill #0

## 2021-05-10 ENCOUNTER — Other Ambulatory Visit (HOSPITAL_BASED_OUTPATIENT_CLINIC_OR_DEPARTMENT_OTHER): Payer: Self-pay

## 2021-05-10 MED ORDER — SOLIFENACIN SUCCINATE 10 MG PO TABS
ORAL_TABLET | Freq: Every day | ORAL | 11 refills | Status: DC
  Filled 2021-05-10: qty 30, 30d supply, fill #0
  Filled 2021-05-31 – 2021-06-03 (×3): qty 30, 30d supply, fill #1
  Filled 2021-07-05 – 2021-09-02 (×2): qty 30, 30d supply, fill #2
  Filled 2021-09-29: qty 30, 30d supply, fill #3
  Filled 2021-10-27: qty 30, 30d supply, fill #4
  Filled 2021-12-01: qty 30, 30d supply, fill #5
  Filled 2021-12-31: qty 30, 30d supply, fill #6
  Filled 2022-01-14 – 2022-02-02 (×2): qty 30, 30d supply, fill #7
  Filled 2022-02-28: qty 30, 30d supply, fill #8
  Filled 2022-04-05: qty 30, 30d supply, fill #9
  Filled 2022-04-22 – 2022-05-06 (×2): qty 30, 30d supply, fill #10

## 2021-05-11 ENCOUNTER — Encounter (INDEPENDENT_AMBULATORY_CARE_PROVIDER_SITE_OTHER): Payer: Self-pay | Admitting: Family Medicine

## 2021-05-11 ENCOUNTER — Ambulatory Visit (INDEPENDENT_AMBULATORY_CARE_PROVIDER_SITE_OTHER): Payer: 59 | Admitting: Family Medicine

## 2021-05-11 ENCOUNTER — Other Ambulatory Visit: Payer: Self-pay

## 2021-05-11 VITALS — BP 98/61 | HR 93 | Temp 97.5°F | Ht 66.0 in | Wt 215.0 lb

## 2021-05-11 DIAGNOSIS — N1832 Chronic kidney disease, stage 3b: Secondary | ICD-10-CM

## 2021-05-11 DIAGNOSIS — E1159 Type 2 diabetes mellitus with other circulatory complications: Secondary | ICD-10-CM | POA: Diagnosis not present

## 2021-05-11 DIAGNOSIS — Z794 Long term (current) use of insulin: Secondary | ICD-10-CM | POA: Diagnosis not present

## 2021-05-11 DIAGNOSIS — E1122 Type 2 diabetes mellitus with diabetic chronic kidney disease: Secondary | ICD-10-CM

## 2021-05-11 DIAGNOSIS — E669 Obesity, unspecified: Secondary | ICD-10-CM | POA: Diagnosis not present

## 2021-05-11 DIAGNOSIS — I152 Hypertension secondary to endocrine disorders: Secondary | ICD-10-CM | POA: Diagnosis not present

## 2021-05-11 DIAGNOSIS — Z6834 Body mass index (BMI) 34.0-34.9, adult: Secondary | ICD-10-CM | POA: Diagnosis not present

## 2021-05-12 ENCOUNTER — Telehealth: Payer: Self-pay | Admitting: Neurology

## 2021-05-12 ENCOUNTER — Other Ambulatory Visit (HOSPITAL_BASED_OUTPATIENT_CLINIC_OR_DEPARTMENT_OTHER): Payer: Self-pay

## 2021-05-12 MED ORDER — CARBIDOPA-LEVODOPA ER 50-200 MG PO TBCR
EXTENDED_RELEASE_TABLET | ORAL | 1 refills | Status: DC
  Filled 2021-05-12: qty 450, 90d supply, fill #0

## 2021-05-12 NOTE — Telephone Encounter (Signed)
noted 

## 2021-05-12 NOTE — Telephone Encounter (Signed)
Pt is requesting a refill for carbidopa-levodopa (SINEMET CR) 50-200 MG tablet.  Pharmacy:  [facility]

## 2021-05-12 NOTE — Progress Notes (Signed)
Chief Complaint:   OBESITY Deanna Rowe is here to discuss her progress with her obesity treatment plan along with follow-up of her obesity related diagnoses. Deanna Rowe is on the Category 3 Plan and states she is following her eating plan approximately 50% of the time. Deanna Rowe states she is using a stationary bike for 30 minutes 5 times per week.  Today's visit was #: 12 Starting weight: 237 lbs Starting date: 04/16/2019 Today's weight: 215 lbs Today's date: 05/11/2021 Total lbs lost to date: 22 lbs Total lbs lost since last in-office visit: 1 lb  Interim History: Deanna Rowe is snacking on sweets at night again whereas she had stopped doing this. Her husband brings honeys bun and candy bars into the house.  She is riding her stationary bike 30 minutes 5 times per week.  She reports she has fallen a few times recently when she tried to make the bed on her own.  She has balance issues due to Parkinson's.  Subjective:   1. Type 2 diabetes mellitus with stage 3b chronic kidney disease, with long-term current use of insulin (HCC) Deanna Rowe's last A1C was 6.4 at Dr. Cindra Eves office. She will see him again in April. She denies hypoglycemia or polyphagia. She is on Basaglar 10 units in the A.M, sliding scale Humalog 4 x per day.  and Ozempic 2 mg. She denies nausea.  Lab Results  Component Value Date   HGBA1C 7.9 (H) 04/16/2019   HGBA1C 6.9 11/25/2016   HGBA1C 6.0 06/10/2016   Lab Results  Component Value Date   MICROALBUR <0.7 02/12/2016   LDLCALC 57 04/16/2019   CREATININE 1.43 (H) 03/02/2020   Lab Results  Component Value Date   INSULIN 12.3 04/16/2019    2. Hypertension associated with diabetes ([place]) Deanna Rowe's blood pressure is low today but usually within normal limits. She is on Norvasc (amlodipine), and losartan. She denies orthostatic symptoms.   BP Readings from Last 3 Encounters:  05/11/21 98/61  04/15/21 110/69  04/07/21 132/74    Assessment/Plan:   1. Type 2 diabetes  mellitus with stage 3b chronic kidney disease, with long-term current use of insulin (HCC) Deanna Rowe will ask Dr. Buddy Duty about possibly starting [facility].   2. Hypertension associated with diabetes ([place]) Deanna Rowe will continue all her medications.  Doses of antihypertensives have been reduced in the past but then blood pressure elevated out of normal range.  3. Obesity: BMI 34.72 Deanna Rowe is currently in the action stage of change. As such, her goal is to continue with weight loss efforts. She has agreed to the Category 3 Plan.   We discussed strategies to avoid snacking at night.   Exercise goals:  Deanna Rowe will add arm exercise 3 times per week. She has 5 lb weights.  Behavioral modification strategies: decreasing simple carbohydrates. I advised her to not make the bed by herself.  Deanna Rowe has agreed to follow-up with our clinic in 4 weeks.  Objective:   Blood pressure 98/61, pulse 93, temperature (!) 97.5 F (36.4 C), height 5\' 6"  (1.676 m), weight 215 lb (97.5 kg), SpO2 98 %. Body mass index is 34.7 kg/m.  General: Cooperative, alert, well developed, in no acute distress. HEENT: Conjunctivae and lids unremarkable. Cardiovascular: Regular rhythm.  Lungs: Normal work of breathing. Neurologic: No focal deficits.   Lab Results  Component Value Date   CREATININE 1.43 (H) 03/02/2020   BUN 13 03/02/2020   NA 136 03/02/2020   K 4.7 03/02/2020   CL 98 03/02/2020   CO2  27 03/02/2020   Lab Results  Component Value Date   ALT 21 04/16/2019   AST 23 04/16/2019   ALKPHOS 58 04/16/2019   BILITOT 0.3 04/16/2019   Lab Results  Component Value Date   HGBA1C 7.9 (H) 04/16/2019   HGBA1C 6.9 11/25/2016   HGBA1C 6.0 06/10/2016   HGBA1C 5.9 02/12/2016   HGBA1C 6.1 12/01/2015   Lab Results  Component Value Date   INSULIN 12.3 04/16/2019   Lab Results  Component Value Date   TSH 1.780 04/16/2019   Lab Results  Component Value Date   CHOL 155 04/16/2019   HDL 84 04/16/2019    LDLCALC 57 04/16/2019   TRIG 71 04/16/2019   Lab Results  Component Value Date   VD25OH 44.8 12/17/2019   VD25OH 40.9 04/16/2019   Lab Results  Component Value Date   WBC 6.8 04/16/2019   HGB 12.3 04/16/2019   HCT 36.1 04/16/2019   MCV 96 04/16/2019   PLT 274 04/16/2019   No results found for: IRON, TIBC, FERRITIN  Attestation Statements:   Reviewed by clinician on day of visit: allergies, medications, problem list, medical history, surgical history, family history, social history, and previous encounter notes.  I, Deanna Rowe, RMA, am acting as Location manager for [employer], [place].  I have reviewed the above documentation for accuracy and completeness, and I agree with the above. -  Deanna Fick, FNP

## 2021-05-12 NOTE — Telephone Encounter (Addendum)
She is on C/L ER 1 pill x 5 per day, and C/L IR 25-250 mg, 1 pill qd to bid - this per Dr. Jannifer Franklin and we kept it the same.

## 2021-05-13 DIAGNOSIS — Z780 Asymptomatic menopausal state: Secondary | ICD-10-CM | POA: Diagnosis not present

## 2021-05-13 DIAGNOSIS — Z01419 Encounter for gynecological examination (general) (routine) without abnormal findings: Secondary | ICD-10-CM | POA: Diagnosis not present

## 2021-05-19 ENCOUNTER — Other Ambulatory Visit (HOSPITAL_BASED_OUTPATIENT_CLINIC_OR_DEPARTMENT_OTHER): Payer: Self-pay

## 2021-05-19 MED FILL — Insulin Pen Needle 32 G X 4 MM (1/6" or 5/32"): 50 days supply | Qty: 100 | Fill #0 | Status: AC

## 2021-05-24 ENCOUNTER — Telehealth: Payer: Self-pay | Admitting: Dietician

## 2021-05-24 NOTE — Telephone Encounter (Signed)
Returned patient call. Patient was trained on a [place] in 2021 but did not reapply.  She would now like to use this again.   Discussed that she needs to have a prescription for the sensors and transmitter. She will obtain these and call for an appointment when ready for training.  She used her cell phone.  Antonieta Iba, RD, LDN, CDCES

## 2021-05-31 ENCOUNTER — Other Ambulatory Visit (HOSPITAL_BASED_OUTPATIENT_CLINIC_OR_DEPARTMENT_OTHER): Payer: Self-pay

## 2021-05-31 ENCOUNTER — Telehealth: Payer: Self-pay | Admitting: Dietician

## 2021-05-31 NOTE — Telephone Encounter (Signed)
Appointment made for [facility] training tomorrow at 9 am.  She will use her cell phone with the G6 app.  Antonieta Iba, RD, LDN, CDCES

## 2021-06-01 ENCOUNTER — Encounter: Payer: 59 | Attending: Internal Medicine | Admitting: Dietician

## 2021-06-01 ENCOUNTER — Other Ambulatory Visit: Payer: Self-pay

## 2021-06-01 ENCOUNTER — Other Ambulatory Visit (HOSPITAL_COMMUNITY): Payer: Self-pay

## 2021-06-01 DIAGNOSIS — E1142 Type 2 diabetes mellitus with diabetic polyneuropathy: Secondary | ICD-10-CM | POA: Insufficient documentation

## 2021-06-01 NOTE — Progress Notes (Signed)
Patient came today with her husband for Dexcom G6 retraining.    Dexcom G6 Personal CGM Training  Start time:0900    End time: 0945 Total time: 45 minutes Deanna Rowe was educated about the following:  -Getting to know device    (Phone programmed ) -Setting alert profile -Inserting sensor ( WNL) -Calibrating- none required for G6 -Ending sensor session -Trouble shooting -Tape guide, clarity information  -Reviewed insulin dosing from dexcom.   Patient has [facility] tech support and my contact information.  Antonieta Iba, RD, LDN, CDCES

## 2021-06-03 ENCOUNTER — Other Ambulatory Visit (HOSPITAL_BASED_OUTPATIENT_CLINIC_OR_DEPARTMENT_OTHER): Payer: Self-pay

## 2021-06-03 ENCOUNTER — Other Ambulatory Visit (HOSPITAL_COMMUNITY): Payer: Self-pay

## 2021-06-04 ENCOUNTER — Other Ambulatory Visit (HOSPITAL_BASED_OUTPATIENT_CLINIC_OR_DEPARTMENT_OTHER): Payer: Self-pay

## 2021-06-04 ENCOUNTER — Other Ambulatory Visit (HOSPITAL_COMMUNITY): Payer: Self-pay

## 2021-06-07 ENCOUNTER — Encounter: Payer: Self-pay | Admitting: Adult Health

## 2021-06-07 ENCOUNTER — Other Ambulatory Visit: Payer: Self-pay

## 2021-06-07 ENCOUNTER — Other Ambulatory Visit (HOSPITAL_COMMUNITY): Payer: Self-pay

## 2021-06-07 ENCOUNTER — Ambulatory Visit (INDEPENDENT_AMBULATORY_CARE_PROVIDER_SITE_OTHER): Payer: 59 | Admitting: Adult Health

## 2021-06-07 ENCOUNTER — Ambulatory Visit: Payer: 59 | Admitting: Adult Health

## 2021-06-07 ENCOUNTER — Encounter: Payer: 59 | Admitting: Dietician

## 2021-06-07 ENCOUNTER — Telehealth: Payer: Self-pay | Admitting: Family Medicine

## 2021-06-07 VITALS — BP 108/68 | HR 76 | Ht 66.0 in | Wt 220.0 lb

## 2021-06-07 DIAGNOSIS — G4733 Obstructive sleep apnea (adult) (pediatric): Secondary | ICD-10-CM

## 2021-06-07 DIAGNOSIS — G20 Parkinson's disease: Secondary | ICD-10-CM | POA: Diagnosis not present

## 2021-06-07 DIAGNOSIS — Z9989 Dependence on other enabling machines and devices: Secondary | ICD-10-CM

## 2021-06-07 NOTE — Patient Instructions (Signed)
Your Plan: ? ?Continue Sinemet CR and IR ?Try using CPAP nightly  ?If your symptoms worsen or you develop new symptoms please let us know.  ? ? ?Thank you for coming to see [name] at [facility]. I hope we have been able to provide you high quality care today. ? ?You may receive a patient satisfaction survey over the next few weeks. We would appreciate your feedback and comments so that we may continue to improve ourselves and the health of our patients. ? ?

## 2021-06-07 NOTE — Progress Notes (Signed)
PATIENT: Deanna Rowe DOB: 27-Jul-1955  REASON FOR VISIT: follow up HISTORY FROM: patient PRIMARY NEUROLOGIST: Dr. Rexene Alberts Sleep Neurologist: Dr. Brett Fairy   HISTORY OF PRESENT ILLNESS: Today 06/07/21:  Deanna Rowe is a 66 year old female with a history of parkinsonism and obstructive sleep apnea on CPAP.  She returns today for follow-up.  She is here today with her husband.  She is currently on Sinemet CR 50-200 milligrams 5 times a day and Sinemet IR 25-250 mg twice a day.  She reports that this combination is working well for her.  Denies any significant tremor.  Reports sometimes her lips will quiver.  She reports over the last year she has had 3 falls.  Denies any significant changes in her gait and balance.  She uses a cane at all times.  She does have achalasia and reports some trouble swallowing liquids and solids.  In regards to sleep apnea she reports that she has not been able to use her machine consistently due to an ongoing cough.  She is currently following up with her allergy specialist regarding this.  HISTORY (copied from Dr. Guadelupe Sabin note) Deanna Rowe is a 66 year old right-handed woman with an underlying complex medical history of asthma, arthritis, anemia, avascular necrosis of the hip, depression, anxiety, type 2 diabetes, reflux disease, hyperlipidemia, hypertension, kidney disease, restless leg syndrome, history of TIA, thyroid disease, vitamin D deficiency, and obesity, who was diagnosed with parkinsonism several years ago.  She was noted to have right-sided signs.  She was on Abilify in the past.  She is no longer on Abilify for the past 2 years, but she is on multiple psychotropic medications including lorazepam, bupropion, Rexulti, Trintellix, and also takes gabapentin as well as zolpidem.  She has clearly stopped the zolpidem about 3 weeks ago and is currently on melatonin 5 mg each night which has been helpful.  She is on ropinirole for restless leg syndrome per  primary care, currently on 2 mg twice daily.  For parkinsonism she is on Sinemet CR, 50-200 mg strength 1 pill 5 times a day.  She last saw Dr. Jannifer Franklin on 08/26/2020, at which time she was advised to add Sinemet IR 25-250 mg strength, 1 pill twice daily, in the morning and at 2 PM daily.     She has a history of urinary incontinence and is currently on Myrbetriq and Vesicare per urology.  She has chronic constipation for at least 1 year and has been on MiraLAX daily, and 2 stool softeners daily. She has been using a cane.  Since the last appointment, she has not fallen thankfully.  She believes that the addition of Sinemet IR has helped but she only takes 1 in the morning.   She had a brain MRI without contrast on 03/15/2017 and I reviewed the results:   IMPRESSION: Slightly abnormal MRI scan of the brain showing prominent changes of chronic microvascular ischemia.  No acute abnormalities noted.  Overall no significant change compared with prior MRI scan dated 05/23/2014.   For her sleep apnea, she is followed by Dr. Brett Fairy and the nurse practitioner.  She reports 50% compliance with her CPAP.  Sometimes acid reflux bothers her at night which bothers her CPAP usage.    REVIEW OF SYSTEMS: Out of a complete 14 system review of symptoms, the patient complains only of the following symptoms, and all other reviewed systems are negative.  ALLERGIES: Allergies  Allergen Reactions   Fluticasone-Salmeterol Anaphylaxis   Codeine Nausea And Vomiting  Fetzima [Levomilnacipran] Nausea And Vomiting   Nsaids Other (See Comments)    Upset stomach    Trulicity [Dulaglutide] Nausea And Vomiting    Significant and undesirably rapid (40 lbs) weight loss    Xanax [Alprazolam] Other (See Comments)    disoriented   Amoxicillin Rash    Has patient had a PCN reaction causing immediate rash, facial/tongue/throat swelling, SOB or lightheadedness with hypotension: Yes Has patient had a PCN reaction causing severe  rash involving mucus membranes or skin necrosis: No Has patient had a PCN reaction that required hospitalization: No Has patient had a PCN reaction occurring within the last 10 years: No If all of the above answers are "NO", then may proceed with Cephalosporin use.    Pseudoephedrine Hcl Er Palpitations    HOME MEDICATIONS: Outpatient Medications Prior to Visit  Medication Sig Dispense Refill   ACCU-CHEK FASTCLIX LANCETS MISC USE TO CHECK BLOOD SUGAR 3 TIMES A DAY 102 each 0   ACCU-CHEK GUIDE test strip USE TO CHECK BLOOD SUGAR 3 TIMES PER DAY. (Patient taking differently: Use to check blood sugar 4 times per day.) 100 each 2   albuterol (VENTOLIN HFA) 108 (90 Base) MCG/ACT inhaler Inhale 2 puffs into the lungs as needed for wheezing or shortness of breath. 18 g 2   amLODipine (NORVASC) 5 MG tablet TAKE ONE TABLET BY MOUTH DAILY. 90 tablet 0   aspirin EC 81 MG tablet Take 81 mg by mouth every evening.     atorvastatin (LIPITOR) 10 MG tablet TAKE 1 TABLET BY MOUTH AT BEDTIME 90 tablet 3   azelastine (ASTELIN) 0.1 % nasal spray Place 2 sprays into both nostrils 2 (two) times daily as needed. 30 mL 5   brexpiprazole (REXULTI) 2 MG TABS tablet Take 1 tablet (2 mg total) by mouth every morning. 90 tablet 1   buPROPion (WELLBUTRIN XL) 150 MG 24 hr tablet Take 3 tablets (450 mg total) by mouth in the morning. 270 tablet 1   Calcium Carbonate-Vitamin D 600-400 MG-UNIT tablet Take 1 tablet by mouth daily.     carbidopa-levodopa (SINEMET CR) 50-200 MG tablet TAKE 1 TABLET BY MOUTH 5 (FIVE) TIMES DAILY. 450 tablet 1   carbidopa-levodopa (SINEMET) 25-250 MG tablet Take 1 tablet by mouth in the morning and at 2 PM 180 tablet 1   COVID-19 At Home Antigen Test (CARESTART COVID-19 HOME TEST) KIT Use as directed 4 each 0   COVID-19 mRNA bivalent vaccine, Pfizer, (PFIZER COVID-19 VAC BIVALENT) injection Inject into the muscle. 0.3 mL 0   EPINEPHrine (EPIPEN 2-PAK) 0.3 mg/0.3 mL IJ SOAJ injection Inject 0.3 mg  into the muscle as needed for anaphylaxis. 2 each 1   gabapentin (NEURONTIN) 300 MG capsule TAKE 1 CAPSULE BY MOUTH TWICE DAILY 180 capsule 1   influenza vaccine adjuvanted (FLUAD) 0.5 ML injection Inject into the muscle. 0.5 mL 0   insulin lispro (HUMALOG) 100 UNIT/ML injection SQ - 14 units at breakfast, 12 units at lunch, 16 units at supper 10 mL 0   Insulin Pen Needle 32G X 4 MM MISC USE AS DIRECTED 100 each 3   lansoprazole (PREVACID) 15 MG capsule Take 1 capsule (15 mg total) by mouth daily at 12 noon. 30 capsule 0   levocetirizine (XYZAL) 5 MG tablet Take 1 tablet (5 mg total) by mouth daily as needed. 30 tablet 5   LORazepam (ATIVAN) 2 MG tablet Take 1 tablet (2 mg total) by mouth 3 (three) times daily. 270 tablet 1  losartan (COZAAR) 100 MG tablet Take 1 tablet by mouth once a day 90 tablet 4   melatonin 5 MG TABS Take 5 mg by mouth at bedtime.     mirabegron ER (MYRBETRIQ) 50 MG TB24 tablet Take 1 tablet by mouth once daily 30 tablet 6   Olopatadine HCl (PATADAY) 0.2 % SOLN Place 1 drop into both eyes daily as needed. 2.5 mL 5   omalizumab (XOLAIR) 150 MG/ML prefilled syringe Inject 300 mg into the skin every 28 (twenty-eight) days. 2 mL 11   propranolol (INDERAL) 40 MG tablet TAKE 1 TABLET BY MOUTH TWICE DAILY 60 tablet 11   rOPINIRole (REQUIP) 2 MG tablet Take 1 tablet (2 mg total) by mouth 2 (two) times daily. 180 tablet 1   Semaglutide, 2 MG/DOSE, (OZEMPIC, 2 MG/DOSE,) 8 MG/3ML SOPN Inject 2 mg under the skin once a week 9 mL 3   Semaglutide, 2 MG/DOSE, (OZEMPIC, 2 MG/DOSE,) 8 MG/3ML SOPN Inject 2 mg into the skin once a week. 9 mL 3   solifenacin (VESICARE) 10 MG tablet TAKE 1 TABLET BY MOUTH ONCE DAILY 30 tablet 11   triamcinolone (NASACORT) 55 MCG/ACT AERO nasal inhaler Place 2 sprays into the nose daily. 16.9 mL 5   valACYclovir (VALTREX) 500 MG tablet Take 1 tablet by mouth everyday 90 tablet 0   vitamin C (ASCORBIC ACID) 500 MG tablet Take 500 mg by mouth daily.     Vitamin  D, Ergocalciferol, 2000 units CAPS Take 1 capsule by mouth daily. 2000 units daily.     vortioxetine HBr (TRINTELLIX) 20 MG TABS tablet Take 1 tablet by mouth every morning 90 tablet 1   zolpidem (AMBIEN) 5 MG tablet Take 1 tablet (5 mg total) by mouth at bedtime. 90 tablet 1   Insulin Glargine (BASAGLAR KWIKPEN) 100 UNIT/ML INJECT 44 UNITS UNDER THE SKIN ONCE DAILY **MAX OF 50 UNITS DAILY** 45 mL 3   Facility-Administered Medications Prior to Visit  Medication Dose Route Frequency Provider Last Rate Last Admin   tranexamic acid (CYKLOKAPRON) topical -INTRAOP  2,000 mg Topical Once [place], [employer], PA-C        PAST MEDICAL HISTORY: Past Medical History:  Diagnosis Date   Anemia    Anxiety    Arthritis    L HIP   Asthma    Avascular necrosis of hip ([place])    LEFT   Back pain    Chest pain    Constipation    Depression    Diabetes mellitus    Diabetes mellitus, type II (HCC)    Dry cough    Edema, lower extremity    Gait abnormality 11/18/2019   GERD (gastroesophageal reflux disease)    High cholesterol    Hypertension    Insomnia    takes Ambien nightly   Joint pain    Kidney disease    Kidney disease    Obesity    Parkinsonism ([place]) 03/02/2017   PONV (postoperative nausea and vomiting)    RLS (restless legs syndrome) 01/15/2018   Sleep apnea    Swallowing difficulty    Thyroid disease    TIA (transient ischemic attack) 2012   no residual problems   Urgency incontinence    Vitamin D deficiency     PAST SURGICAL HISTORY: Past Surgical History:  Procedure Laterality Date   DILATION AND CURETTAGE OF UTERUS     ESOPHAGEAL MANOMETRY N/A 09/03/2012   Procedure: ESOPHAGEAL MANOMETRY (EM);  Surgeon: Garlan Fair, MD;  Location: [facility];  Service: Endoscopy;  Laterality: N/A;   ESOPHAGEAL MANOMETRY N/A 06/19/2017   Procedure: ESOPHAGEAL MANOMETRY (EM);  Surgeon: Clarene Essex, MD;  Location: [facility];  Service: Endoscopy;  Laterality: N/A;   EXPLORATORY  LAPAROTOMY     GANGLION CYST EXCISION     JOINT REPLACEMENT  2011   rt total hip   KNEE ARTHROSCOPY     PILONIDAL CYST / SINUS EXCISION     TOTAL HIP ARTHROPLASTY Left 10/09/2013   Procedure: LEFT TOTAL HIP ARTHROPLASTY ANTERIOR APPROACH;  Surgeon: [name], MD;  Location: [facility];  Service: Orthopedics;  Laterality: Left;    FAMILY HISTORY: Family History  Problem Relation Age of Onset   High blood pressure Mother    Hyperlipidemia Mother    Heart disease Mother    Heart disease Father    Alcoholism Father    Allergic rhinitis Sister    Alcohol abuse Brother    Alcohol abuse Brother    Alcohol abuse Brother    Alcohol abuse Brother    Diabetes Neg Hx    Angioedema Neg Hx    Asthma Neg Hx    Eczema Neg Hx    Immunodeficiency Neg Hx    Urticaria Neg Hx    Breast cancer Neg Hx    Parkinson's disease Neg Hx    Sleep apnea Neg Hx     SOCIAL HISTORY: Social History   Socioeconomic History   Marital status: Married    Spouse name: Simona Huh   Number of children: Not on file   Years of education: Not on file   Highest education level: Not on file  Occupational History   Occupation: retired Therapist, sports  Tobacco Use   Smoking status: Never   Smokeless tobacco: Never  Vaping Use   Vaping Use: Never used  Substance and Sexual Activity   Alcohol use: No    Alcohol/week: 0.0 standard drinks   Drug use: No   Sexual activity: Not Currently  Other Topics Concern   Not on file  Social History Narrative   Lives with husband   Caffeine use: none   Right handed    Social Determinants of Health   Financial Resource Strain: Not on file  Food Insecurity: Not on file  Transportation Needs: Not on file  Physical Activity: Not on file  Stress: Not on file  Social Connections: Not on file  Intimate Partner Violence: Not on file      PHYSICAL EXAM  Vitals:   06/07/21 1048  BP: 108/68  Pulse: 76  Weight: 220 lb (99.8 kg)  Height: 5' 6"  (1.676 m)   Body mass index is  35.51 kg/m.  Generalized: Well developed, in no acute distress   Neurological examination  Mentation: Alert oriented to time, place, history taking. Follows all commands speech and language fluent Cranial nerve II-XII: Pupils were equal round reactive to light. Extraocular movements were full, visual field were full on confrontational test. Facial sensation and strength were normal. Uvula tongue midline. Head turning and shoulder shrug  were normal and symmetric. Motor: The motor testing reveals 5 over 5 strength of all 4 extremities. Good symmetric motor tone is noted throughout.  Sensory: Sensory testing is intact to soft touch on all 4 extremities. No evidence of extinction is noted.  Coordination: Cerebellar testing reveals good finger-nose-finger and heel-to-shin bilaterally.  Gait and station: Gait is normal. Tandem gait is normal. Romberg is negative. No drift is seen.  Reflexes: Deep tendon reflexes are symmetric and normal bilaterally.  DIAGNOSTIC DATA (LABS, IMAGING, TESTING) - I reviewed patient records, labs, notes, testing and imaging myself where available.  Lab Results  Component Value Date   WBC 6.8 04/16/2019   HGB 12.3 04/16/2019   HCT 36.1 04/16/2019   MCV 96 04/16/2019   PLT 274 04/16/2019      Component Value Date/Time   NA 136 03/02/2020 0920   NA 142 01/12/2017 0914   K 4.7 03/02/2020 0920   K 4.2 01/12/2017 0914   CL 98 03/02/2020 0920   CO2 27 03/02/2020 0920   CO2 28 01/12/2017 0914   GLUCOSE 139 (H) 03/02/2020 0920   GLUCOSE 166 (H) 10/12/2018 1121   GLUCOSE 142 (H) 01/12/2017 0914   BUN 13 03/02/2020 0920   BUN 12.0 01/12/2017 0914   CREATININE 1.43 (H) 03/02/2020 0920   CREATININE 1.58 (H) 10/12/2018 1121   CREATININE 1.3 (H) 01/12/2017 0914   CALCIUM 9.4 03/02/2020 0920   CALCIUM 9.6 01/12/2017 0914   PROT 7.0 04/16/2019 1451   PROT 7.2 01/12/2017 0914   ALBUMIN 4.2 04/16/2019 1451   ALBUMIN 3.5 01/12/2017 0914   AST 23 04/16/2019 1451    AST 16 10/12/2018 1121   AST 19 01/12/2017 0914   ALT 21 04/16/2019 1451   ALT <6 10/12/2018 1121   ALT 18 01/12/2017 0914   ALKPHOS 58 04/16/2019 1451   ALKPHOS 43 01/12/2017 0914   BILITOT 0.3 04/16/2019 1451   BILITOT 0.2 (L) 10/12/2018 1121   BILITOT 0.28 01/12/2017 0914   GFRNONAA 38 (L) 03/02/2020 0920   GFRNONAA 34 (L) 10/12/2018 1121   GFRAA 44 (L) 03/02/2020 0920   GFRAA 40 (L) 10/12/2018 1121   Lab Results  Component Value Date   CHOL 155 04/16/2019   HDL 84 04/16/2019   LDLCALC 57 04/16/2019   TRIG 71 04/16/2019   Lab Results  Component Value Date   HGBA1C 7.9 (H) 04/16/2019   Lab Results  Component Value Date   VITAMINB12 305 04/16/2019   Lab Results  Component Value Date   TSH 1.780 04/16/2019      ASSESSMENT AND PLAN 66 y.o. year old female  has a past medical history of Anemia, Anxiety, Arthritis, Asthma, Avascular necrosis of hip ([place]), Back pain, Chest pain, Constipation, Depression, Diabetes mellitus, Diabetes mellitus, type II ([place]), Dry cough, Edema, lower extremity, Gait abnormality (11/18/2019), GERD (gastroesophageal reflux disease), High cholesterol, Hypertension, Insomnia, Joint pain, Kidney disease, Kidney disease, Obesity, Parkinsonism ([place]) (03/02/2017), PONV (postoperative nausea and vomiting), RLS (restless legs syndrome) (01/15/2018), Sleep apnea, Swallowing difficulty, Thyroid disease, TIA (transient ischemic attack) (2012), Urgency incontinence, and Vitamin D deficiency. here with:  OSA on CPAP  Encouraged the patient to try to restart CPAP therapy. Discussed risk associated with untreated sleep apnea  2.  Parkinsonism  Continue Sinemet CR 50-200 mg 5 times a day Continue Sinemet IR 25-250 mg twice a day Continue using cane at all times.  If she has any recent falls she should let us know  Advised if symptoms worsen or she develops new symptoms she should let us know follow-up in 6 months or sooner if needed     Ward Givens,  MSN, NP-C 06/07/2021, 10:54 AM [facility] [address] [place], [place] [address] [phone]

## 2021-06-07 NOTE — Telephone Encounter (Signed)
Patient states is given self injections of Xolair and she is breaking out in red bumps asking the nurse to call her in reference to this reaction please advise  ?

## 2021-06-08 ENCOUNTER — Ambulatory Visit (INDEPENDENT_AMBULATORY_CARE_PROVIDER_SITE_OTHER): Payer: 59 | Admitting: Adult Health

## 2021-06-09 ENCOUNTER — Other Ambulatory Visit (HOSPITAL_BASED_OUTPATIENT_CLINIC_OR_DEPARTMENT_OTHER): Payer: Self-pay

## 2021-06-09 NOTE — Telephone Encounter (Signed)
Just red spots no breathing or gi issues she feels like they are gone. She does take zyrtec qd  ?

## 2021-06-09 NOTE — Telephone Encounter (Signed)
Can you please find out if she had other symptoms including breathing or GI symptoms. How long did the hives last? Does she take a daily antihistamine? Thank you

## 2021-06-09 NOTE — Telephone Encounter (Signed)
Never had hives after a shot before. They lasted 1-2 days. Not been sick and no new products.  ?

## 2021-06-09 NOTE — Telephone Encounter (Signed)
Has she had hives after her Xolair injection before? How long did they last? Any illness recently, new personal care products? She can double the zyrtec on injection day. She can also start coming into the clinic for Xolair injection if the technique is in question. It looks as though she has been doing Xolair injections without issues for some time though. Please make sure epipens are up to date. Thank you ?

## 2021-06-11 NOTE — Telephone Encounter (Signed)
Can you please find out if this patient had hives after the most recent injection? Thank you

## 2021-06-11 NOTE — Telephone Encounter (Signed)
With her most recent shot she did not hives  ?

## 2021-06-11 NOTE — Telephone Encounter (Signed)
Great news!! Tank you!!!

## 2021-06-25 ENCOUNTER — Other Ambulatory Visit (HOSPITAL_COMMUNITY): Payer: Self-pay

## 2021-06-28 ENCOUNTER — Other Ambulatory Visit (HOSPITAL_COMMUNITY): Payer: Self-pay

## 2021-06-29 ENCOUNTER — Other Ambulatory Visit (HOSPITAL_BASED_OUTPATIENT_CLINIC_OR_DEPARTMENT_OTHER): Payer: Self-pay

## 2021-06-29 MED ORDER — SOLIFENACIN SUCCINATE 10 MG PO TABS
10.0000 mg | ORAL_TABLET | Freq: Every day | ORAL | 11 refills | Status: DC
  Filled 2021-06-29: qty 30, 30d supply, fill #0
  Filled 2021-08-03: qty 30, 30d supply, fill #1

## 2021-06-30 ENCOUNTER — Telehealth (INDEPENDENT_AMBULATORY_CARE_PROVIDER_SITE_OTHER): Payer: 59 | Admitting: Nurse Practitioner

## 2021-06-30 DIAGNOSIS — Z6839 Body mass index (BMI) 39.0-39.9, adult: Secondary | ICD-10-CM

## 2021-06-30 DIAGNOSIS — E1159 Type 2 diabetes mellitus with other circulatory complications: Secondary | ICD-10-CM

## 2021-06-30 DIAGNOSIS — G20A1 Parkinson's disease without dyskinesia, without mention of fluctuations: Secondary | ICD-10-CM

## 2021-06-30 DIAGNOSIS — N1832 Chronic kidney disease, stage 3b: Secondary | ICD-10-CM | POA: Diagnosis not present

## 2021-06-30 DIAGNOSIS — Z794 Long term (current) use of insulin: Secondary | ICD-10-CM | POA: Diagnosis not present

## 2021-06-30 DIAGNOSIS — E1122 Type 2 diabetes mellitus with diabetic chronic kidney disease: Secondary | ICD-10-CM

## 2021-06-30 DIAGNOSIS — G20 Parkinson's disease: Secondary | ICD-10-CM

## 2021-06-30 DIAGNOSIS — G4733 Obstructive sleep apnea (adult) (pediatric): Secondary | ICD-10-CM

## 2021-06-30 DIAGNOSIS — Z6834 Body mass index (BMI) 34.0-34.9, adult: Secondary | ICD-10-CM

## 2021-06-30 DIAGNOSIS — I152 Hypertension secondary to endocrine disorders: Secondary | ICD-10-CM

## 2021-06-30 DIAGNOSIS — E669 Obesity, unspecified: Secondary | ICD-10-CM

## 2021-07-01 ENCOUNTER — Other Ambulatory Visit (HOSPITAL_COMMUNITY): Payer: Self-pay

## 2021-07-01 NOTE — Progress Notes (Signed)
? ? ?TeleHealth Visit:  ?Due to the COVID-19 pandemic, this visit was completed with telemedicine (audio/video) technology to reduce patient and provider exposure as well as to preserve personal protective equipment.  ? ?Deanna Rowe has verbally consented to this TeleHealth visit. The patient is located at home, the provider is located at the [facility] office. The participants in this visit include the listed provider and patient. The visit was conducted today via video. ? ?Chief Complaint: OBESITY ?Deanna Rowe is here to discuss her progress with her obesity treatment plan along with follow-up of her obesity related diagnoses. Deanna Rowe is on the Category 3 Plan and states she is following her eating plan approximately 50% of the time. Deanna Rowe states she is using a stationary bike for 15 minutes 3 times per week. ? ?Today's visit was #: 25 ?Starting weight: 237 lbs ?Starting date: 04/16/2019 ? ?Interim History: Deanna Rowe changed over to video visit due to not feeling well. She returned from a 7 day cruise yesterday. She notes today that her legs hurt, she's having difficulty walking, has a headache and is not feeling well. Her weight reported today is 225 lbs. She fell once on the cruise. She has Parkinson and is seeing neurology on a regular basis.  ? ?07/01/21 1:55 pm.  I called and talked with Deanna Rowe.  She reports that she is overall feeling a lot better.  Will continue to monitor and follow up with neurology.  ? ?Subjective:  ? ?1. Type 2 diabetes mellitus with stage 3b chronic kidney disease, with long-term current use of insulin ([place]) ?Deanna Rowe is due to see Dr. Buddy Duty in April. She is currently taking Basaglar, insulin, sliding scale Humalog and Ozempic 2 mg. Her fasting blood sugars range 88-100. She denies hypoglycemia.  ? ?2. Hypertension associated with diabetes (Deanna Rowe) ?Deanna Rowe checked her blood pressure at home and it was 110/70 today. She denies chest pain or palpitations.  ? ?3. Obstructive sleep  apnea syndrome ?Deanna Rowe is not using her CPAP due to cough. She saw her neurologist last on 06/07/2021. ? ?4. Parkinson disease (Deanna Rowe) ?Deanna Rowe sees Dr. Jannifer Franklin (neurology).  ? ?Assessment/Plan:  ? ?1. Type 2 diabetes mellitus with stage 3b chronic kidney disease, with long-term current use of insulin ([place]) ?Adelai will continue to follow up with Dr. Buddy Duty. She will continue to monitor fasting blood sugar and post prandial blood sugar. She will continue her medications as directed. Good blood sugar control is important to decrease the likelihood of diabetic complications such as nephropathy, neuropathy, limb loss, blindness, coronary artery disease, and death. Intensive lifestyle modification including diet, exercise and weight loss are the first line of treatment for diabetes.  ? ?2. Hypertension associated with diabetes (Deanna Rowe) ?Maahi will continue to monitor her blood pressure at home. She will continue to follow up with primary care physician. She will continue medications as directed. She is working on healthy weight loss and exercise to improve blood pressure control. We will watch for signs of hypotension as she continues her lifestyle modifications. ? ?3. Obstructive sleep apnea syndrome ?Deanna Rowe will continue to follow up with neurology. She has an appointment scheduled with her allergist due to problems wearing CPAP because of her allergies. Intensive lifestyle modifications are the first line treatment for this issue. We discussed several lifestyle modifications today and she will continue to work on diet, exercise and weight loss efforts. We will continue to monitor. Orders and follow up as documented in patient record.   ? ?4. Parkinson disease (Deanna Rowe) ?I asked  Deanna Rowe to contact her neurologist to schedule a follow up appointment. She was advised to go the the emergency room if symptoms worsen or persist.  ? ?5. Obesity: BMI 34.70 ?Deanna Rowe is currently in the action stage of change. As such, her goal is to  continue with weight loss efforts. She has agreed to the Category 3 Plan.  ? ?Exercise goals:  Deanna Rowe will avoid bike until feeling better or discussed with neurologist.  ? ?Behavioral modification strategies: increasing lean protein intake, increasing water intake, no skipping meals, and meal planning and cooking strategies. ? ?Deanna Rowe has agreed to follow-up with our clinic in 2 weeks. She was informed of the importance of frequent follow-up visits to maximize her success with intensive lifestyle modifications for her multiple health conditions. ? ?Objective:  ? ?VITALS: Per patient if applicable, see vitals. ?GENERAL: Alert and in no acute distress. ?CARDIOPULMONARY: No increased WOB. Speaking in clear sentences.  ?PSYCH: Pleasant and cooperative. Speech normal rate and rhythm. Affect is appropriate. Insight and judgement are appropriate. Attention is focused, linear, and appropriate.  ?NEURO: Oriented as arrived to appointment on time with no prompting.  ? ?Lab Results  ?Component Value Date  ? CREATININE 1.43 (H) 03/02/2020  ? BUN 13 03/02/2020  ? NA 136 03/02/2020  ? K 4.7 03/02/2020  ? CL 98 03/02/2020  ? CO2 27 03/02/2020  ? ?Lab Results  ?Component Value Date  ? ALT 21 04/16/2019  ? AST 23 04/16/2019  ? ALKPHOS 58 04/16/2019  ? BILITOT 0.3 04/16/2019  ? ?Lab Results  ?Component Value Date  ? HGBA1C 7.9 (H) 04/16/2019  ? HGBA1C 6.9 11/25/2016  ? HGBA1C 6.0 06/10/2016  ? HGBA1C 5.9 02/12/2016  ? HGBA1C 6.1 12/01/2015  ? ?Lab Results  ?Component Value Date  ? INSULIN 12.3 04/16/2019  ? ?Lab Results  ?Component Value Date  ? TSH 1.780 04/16/2019  ? ?Lab Results  ?Component Value Date  ? CHOL 155 04/16/2019  ? HDL 84 04/16/2019  ? [place] 57 04/16/2019  ? TRIG 71 04/16/2019  ? ?Lab Results  ?Component Value Date  ? VD25OH 44.8 12/17/2019  ? VD25OH 40.9 04/16/2019  ? ?Lab Results  ?Component Value Date  ? WBC 6.8 04/16/2019  ? HGB 12.3 04/16/2019  ? HCT 36.1 04/16/2019  ? MCV 96 04/16/2019  ? PLT 274 04/16/2019   ? ?No results found for: IRON, TIBC, FERRITIN ? ?Attestation Statements:  ? ?Reviewed by clinician on day of visit: allergies, medications, problem list, medical history, surgical history, family history, social history, and previous encounter notes. ? ?Time spent on visit including pre-visit chart review and post-visit charting and care was 30 minutes.  ? ?I, Lizbeth Bark, RMA, am acting as Location manager for Everardo Pacific, FNP. ? ?I have reviewed the above documentation for accuracy and completeness, and I agree with the above. Everardo Pacific, FNP   ?

## 2021-07-05 ENCOUNTER — Other Ambulatory Visit (HOSPITAL_BASED_OUTPATIENT_CLINIC_OR_DEPARTMENT_OTHER): Payer: Self-pay

## 2021-07-06 ENCOUNTER — Other Ambulatory Visit (HOSPITAL_BASED_OUTPATIENT_CLINIC_OR_DEPARTMENT_OTHER): Payer: Self-pay

## 2021-07-06 MED ORDER — VALACYCLOVIR HCL 500 MG PO TABS
ORAL_TABLET | ORAL | 4 refills | Status: DC
  Filled 2021-07-06: qty 90, 90d supply, fill #0
  Filled 2021-09-29: qty 90, 90d supply, fill #1
  Filled 2021-12-31: qty 90, 90d supply, fill #2
  Filled 2022-04-05: qty 90, 90d supply, fill #3
  Filled 2022-07-04: qty 90, 90d supply, fill #4

## 2021-07-08 ENCOUNTER — Other Ambulatory Visit (HOSPITAL_BASED_OUTPATIENT_CLINIC_OR_DEPARTMENT_OTHER): Payer: Self-pay

## 2021-07-15 ENCOUNTER — Ambulatory Visit (INDEPENDENT_AMBULATORY_CARE_PROVIDER_SITE_OTHER): Payer: 59 | Admitting: Nurse Practitioner

## 2021-07-15 ENCOUNTER — Encounter (INDEPENDENT_AMBULATORY_CARE_PROVIDER_SITE_OTHER): Payer: Self-pay | Admitting: Nurse Practitioner

## 2021-07-15 VITALS — BP 99/64 | HR 97 | Temp 98.2°F | Ht 66.0 in | Wt 214.0 lb

## 2021-07-15 DIAGNOSIS — Z7985 Long-term (current) use of injectable non-insulin antidiabetic drugs: Secondary | ICD-10-CM

## 2021-07-15 DIAGNOSIS — N1832 Chronic kidney disease, stage 3b: Secondary | ICD-10-CM

## 2021-07-15 DIAGNOSIS — E1159 Type 2 diabetes mellitus with other circulatory complications: Secondary | ICD-10-CM | POA: Diagnosis not present

## 2021-07-15 DIAGNOSIS — Z794 Long term (current) use of insulin: Secondary | ICD-10-CM

## 2021-07-15 DIAGNOSIS — E1122 Type 2 diabetes mellitus with diabetic chronic kidney disease: Secondary | ICD-10-CM

## 2021-07-15 DIAGNOSIS — Z6834 Body mass index (BMI) 34.0-34.9, adult: Secondary | ICD-10-CM | POA: Diagnosis not present

## 2021-07-15 DIAGNOSIS — I152 Hypertension secondary to endocrine disorders: Secondary | ICD-10-CM

## 2021-07-15 DIAGNOSIS — E669 Obesity, unspecified: Secondary | ICD-10-CM | POA: Diagnosis not present

## 2021-07-19 ENCOUNTER — Other Ambulatory Visit (HOSPITAL_BASED_OUTPATIENT_CLINIC_OR_DEPARTMENT_OTHER): Payer: Self-pay

## 2021-07-23 DIAGNOSIS — R519 Headache, unspecified: Secondary | ICD-10-CM | POA: Diagnosis not present

## 2021-07-23 DIAGNOSIS — R269 Unspecified abnormalities of gait and mobility: Secondary | ICD-10-CM | POA: Diagnosis not present

## 2021-07-26 ENCOUNTER — Other Ambulatory Visit (HOSPITAL_COMMUNITY): Payer: Self-pay

## 2021-07-27 DIAGNOSIS — E1165 Type 2 diabetes mellitus with hyperglycemia: Secondary | ICD-10-CM | POA: Diagnosis not present

## 2021-07-27 DIAGNOSIS — Z794 Long term (current) use of insulin: Secondary | ICD-10-CM | POA: Diagnosis not present

## 2021-07-28 NOTE — Progress Notes (Signed)
? ? ? ?Chief Complaint:  ? ?Deanna Rowe ?Deanna Rowe is here to discuss her progress with her Deanna Rowe treatment plan along with follow-up of her Deanna Rowe related diagnoses. Deanna Rowe is on the Category 3 Plan and states she is following her eating plan approximately 50% of the time. Deanna Rowe states she is using stationary bike for 15 minutes 3 times per week. ? ?Today's visit was #: 32 ?Starting weight: 237 lbs ?Starting date: 04/16/2019 ?Today's weight: 214 lbs ?Today's date: 07/15/2021 ?Total lbs lost to date: 23 lbs ?Total lbs lost since last in-office visit: 0 ? ?Interim History: Deanna Rowe consented to a video visit 06/30/2021 due to not feeling well. She reports overall feeling better now. She denies falls since her last visit. Her husband continues to bring sweets into the house. She drinks water and diet soda occasionally.  ? ?Subjective:  ? ?1. Type 2 diabetes mellitus with stage 3b chronic kidney disease, with long-term current use of insulin (Deanna Rowe) ?Deanna Rowe is taking Ozempic 2 mg since February. She denies side effects. She doesn't feel that this dose is working as well as the 1 mg did. Her fasting blood sugar was in the range of 92-124. She had one blood sugar of 44. Her last A1C was 6.4 05/2021. ? ?2. Hypertension associated with diabetes (Deanna Rowe) ?Deanna Rowe is currently taking Norvasc 5 mg, losartan 100 mg, and propranolol 40 mg twice daily. She denies dizziness. Her last EKG was 04/07/2021. ? ?Assessment/Plan:  ? ?1. Type 2 diabetes mellitus with stage 3b chronic kidney disease, with long-term current use of insulin (Deanna Rowe) ?Deanna Rowe will keep appointment with Dr. Buddy Duty. She would like to decrease Ozempic 1 mg. She reports having labs every 6 months. She will discuss with Dr. Buddy Duty. Good blood sugar control is important to decrease the likelihood of diabetic complications such as nephropathy, neuropathy, limb loss, blindness, coronary artery disease, and death. Intensive lifestyle modification including diet, exercise and weight  loss are the first line of treatment for diabetes.  ? ?2. Hypertension associated with diabetes (Deanna Rowe) ?Deanna Rowe will call her primary care physician's office to schedule appointment for follow up. She is working on healthy weight loss and exercise to improve blood pressure control. We will watch for signs of hypotension as she continues her lifestyle modifications. ? ?3. Deanna Rowe: BMI 34.6 ?Deanna Rowe is currently in the action stage of change. As such, her goal is to continue with weight loss efforts. She has agreed to the Category 3 Plan.  ? ?Exercise goals:  As is.  ? ?Behavioral modification strategies: increasing lean protein intake, increasing water intake, no skipping meals, and meal planning and cooking strategies. ? ?Deanna Rowe has agreed to follow-up with our clinic in 4 weeks. She was informed of the importance of frequent follow-up visits to maximize her success with intensive lifestyle modifications for her multiple health conditions.  ? ?Objective:  ? ?Blood pressure 99/64, pulse 97, temperature 98.2 ?F (36.8 ?C), height '5\' 6"'$  (1.676 m), weight 214 lb (97.1 kg), SpO2 97 %. ?Body mass index is 34.54 kg/m?. ? ?General: Cooperative, alert, well developed, in no acute distress. ?HEENT: Conjunctivae and lids unremarkable. ?Cardiovascular: Regular rhythm.  ?Lungs: Normal work of breathing. ?Neurologic: No focal deficits.  ? ?Lab Results  ?Component Value Date  ? CREATININE 1.43 (H) 03/02/2020  ? BUN 13 03/02/2020  ? NA 136 03/02/2020  ? K 4.7 03/02/2020  ? CL 98 03/02/2020  ? CO2 27 03/02/2020  ? ?Lab Results  ?Component Value Date  ? ALT 21 04/16/2019  ? AST  23 04/16/2019  ? ALKPHOS 58 04/16/2019  ? BILITOT 0.3 04/16/2019  ? ?Lab Results  ?Component Value Date  ? HGBA1C 7.9 (H) 04/16/2019  ? HGBA1C 6.9 11/25/2016  ? HGBA1C 6.0 06/10/2016  ? HGBA1C 5.9 02/12/2016  ? HGBA1C 6.1 12/01/2015  ? ?Lab Results  ?Component Value Date  ? INSULIN 12.3 04/16/2019  ? ?Lab Results  ?Component Value Date  ? TSH 1.780 04/16/2019   ? ?Lab Results  ?Component Value Date  ? CHOL 155 04/16/2019  ? HDL 84 04/16/2019  ? [place] 57 04/16/2019  ? TRIG 71 04/16/2019  ? ?Lab Results  ?Component Value Date  ? VD25OH 44.8 12/17/2019  ? VD25OH 40.9 04/16/2019  ? ?Lab Results  ?Component Value Date  ? WBC 6.8 04/16/2019  ? HGB 12.3 04/16/2019  ? HCT 36.1 04/16/2019  ? MCV 96 04/16/2019  ? PLT 274 04/16/2019  ? ?No results found for: IRON, TIBC, FERRITIN ? ?Attestation Statements:  ? ?Reviewed by clinician on day of visit: allergies, medications, problem list, medical history, surgical history, family history, social history, and previous encounter notes. ? ?Time spent on visit including pre-visit chart review and post-visit care and charting was 30 minutes.  ? ?I, Lizbeth Bark, RMA, am acting as Location manager for Everardo Pacific, FNP.  ? ?I have reviewed the above documentation for accuracy and completeness, and I agree with the above. Everardo Pacific, FNP  ?

## 2021-07-30 ENCOUNTER — Other Ambulatory Visit (HOSPITAL_BASED_OUTPATIENT_CLINIC_OR_DEPARTMENT_OTHER): Payer: Self-pay

## 2021-07-30 ENCOUNTER — Other Ambulatory Visit (HOSPITAL_COMMUNITY): Payer: Self-pay

## 2021-07-30 ENCOUNTER — Other Ambulatory Visit: Payer: Self-pay | Admitting: Family Medicine

## 2021-08-02 ENCOUNTER — Other Ambulatory Visit: Payer: Self-pay | Admitting: Pharmacist

## 2021-08-02 ENCOUNTER — Other Ambulatory Visit (HOSPITAL_COMMUNITY): Payer: Self-pay

## 2021-08-02 MED ORDER — OMALIZUMAB 150 MG/ML ~~LOC~~ SOSY
300.0000 mg | PREFILLED_SYRINGE | SUBCUTANEOUS | 11 refills | Status: DC
  Filled 2021-08-02: qty 2, 28d supply, fill #0
  Filled 2021-08-26: qty 2, 28d supply, fill #1
  Filled 2021-09-23: qty 2, 28d supply, fill #2
  Filled 2021-10-21: qty 2, 28d supply, fill #3
  Filled 2021-11-24: qty 2, 28d supply, fill #4
  Filled 2021-12-23: qty 2, 28d supply, fill #5
  Filled 2022-01-19: qty 2, 28d supply, fill #6
  Filled 2022-02-22: qty 2, 28d supply, fill #7
  Filled 2022-03-22: qty 2, 28d supply, fill #8
  Filled 2022-04-21 – 2022-05-11 (×2): qty 2, 28d supply, fill #9
  Filled 2022-06-01: qty 2, 28d supply, fill #10
  Filled 2022-06-23 – 2022-06-27 (×2): qty 2, 28d supply, fill #11

## 2021-08-02 MED ORDER — OMALIZUMAB 150 MG/ML ~~LOC~~ SOSY
300.0000 mg | PREFILLED_SYRINGE | SUBCUTANEOUS | 11 refills | Status: DC
  Filled 2021-08-02: qty 2, 28d supply, fill #0

## 2021-08-03 ENCOUNTER — Other Ambulatory Visit (HOSPITAL_BASED_OUTPATIENT_CLINIC_OR_DEPARTMENT_OTHER): Payer: Self-pay

## 2021-08-03 MED ORDER — MYRBETRIQ 50 MG PO TB24
ORAL_TABLET | ORAL | 6 refills | Status: DC
  Filled 2021-08-03: qty 30, 30d supply, fill #0
  Filled 2021-09-02: qty 30, 30d supply, fill #1
  Filled 2021-09-29: qty 30, 30d supply, fill #2
  Filled 2021-10-27: qty 30, 30d supply, fill #3
  Filled 2021-12-01: qty 30, 30d supply, fill #4

## 2021-08-03 MED ORDER — AMLODIPINE BESYLATE 5 MG PO TABS
ORAL_TABLET | ORAL | 0 refills | Status: DC
  Filled 2021-08-03: qty 90, 90d supply, fill #0

## 2021-08-05 ENCOUNTER — Encounter: Payer: Self-pay | Admitting: Neurology

## 2021-08-05 ENCOUNTER — Ambulatory Visit (INDEPENDENT_AMBULATORY_CARE_PROVIDER_SITE_OTHER): Payer: 59 | Admitting: Neurology

## 2021-08-05 ENCOUNTER — Other Ambulatory Visit (HOSPITAL_BASED_OUTPATIENT_CLINIC_OR_DEPARTMENT_OTHER): Payer: Self-pay

## 2021-08-05 VITALS — BP 121/76 | HR 73 | Ht 66.0 in | Wt 218.8 lb

## 2021-08-05 DIAGNOSIS — G20 Parkinson's disease: Secondary | ICD-10-CM

## 2021-08-05 DIAGNOSIS — G20C Parkinsonism, unspecified: Secondary | ICD-10-CM

## 2021-08-05 NOTE — Progress Notes (Signed)
Subjective:  ?  ?Patient ID: Deanna Rowe is a 66 y.o. female. ? ?HPI ? ? ? ?Interim history:  ? ?Deanna Rowe is a 66 year old right-handed woman with an underlying medical history of asthma, arthritis, sleep apnea, reflux disease, hypertension, hyperlipidemia, avascular necrosis of the hip, back pain, history of chest pain, depression, diabetes, restless leg syndrome, vitamin D deficiency, parkinsonism, and obesity, who presents for follow-up consultation of her parkinsonism.  I first met her on 01/29/2021, at which time she was advised to continue with Sinemet CR 1 pill 5 times a day and Sinemet IR 1 pill once daily or up to twice daily. She was previously followed by Dr. Margette Fast.  She saw Ward Givens, NP in the interim on 06/07/2021 for sleep apnea follow-up.  She reported that she was not able to use her PAP machine consistently due to chronic cough.  She was advised to restart CPAP therapy.  She was advised to continue with Sinemet CR 1 pill 5 times a day and Sinemet IR twice daily.  She was advised to use her cane at all times. ? ?Today, 08/05/2021: She reports feeling about the same, had a near fall recently but no actual fall, uses her cane at times and sometimes her upright walker or her rollator.  She has no trouble swallowing but does have mouth dryness.  She tries to hydrate well with water, her husband feels that her short-term memory is not as good and she has had instances of mood irritability.  She sees her psychiatrist every 6 months and had a therapist but has not seen them in over 6 months.  She takes MiraLAX every other day for constipation.  She takes Sinemet CR 1 pill 5 times a day, at 8, 12, 4 PM, 8 PM and 10 PM daily.  She takes ropinirole 2 mg twice daily at 8 AM and 10 PM daily and takes Sinemet IR twice daily at 8 AM and 2 PM daily. ? ?The patient's allergies, current medications, family history, past medical history, past social history, past surgical history and problem list  were reviewed and updated as appropriate.  ? ?Previously:  ? ?01/29/21: 66 year old right-handed woman with an underlying complex medical history of asthma, arthritis, anemia, avascular necrosis of the hip, depression, anxiety, type 2 diabetes, reflux disease, hyperlipidemia, hypertension, kidney disease, restless leg syndrome, history of TIA, thyroid disease, vitamin D deficiency, and obesity, who was diagnosed with parkinsonism several years ago.  She was noted to have right-sided signs.  She was on Abilify in the past.  She is no longer on Abilify for the past 2 years, but she is on multiple psychotropic medications including lorazepam, bupropion, Rexulti, Trintellix, and also takes gabapentin as well as zolpidem.  She has clearly stopped the zolpidem about 3 weeks ago and is currently on melatonin 5 mg each night which has been helpful.  She is on ropinirole for restless leg syndrome per primary care, currently on 2 mg twice daily.  For parkinsonism she is on Sinemet CR, 50-200 mg strength 1 pill 5 times a day.  She last saw Dr. Jannifer Franklin on 08/26/2020, at which time she was advised to add Sinemet IR 25-250 mg strength, 1 pill twice daily, in the morning and at 2 PM daily.   ?  ?She has a history of urinary incontinence and is currently on Myrbetriq and Vesicare per urology.  She has chronic constipation for at least 1 year and has been on MiraLAX daily, and 2  stool softeners daily. ?She has been using a cane.  Since the last appointment, she has not fallen thankfully.  She believes that the addition of Sinemet IR has helped but she only takes 1 in the morning. ?  ?She had a brain MRI without contrast on 03/15/2017 and I reviewed the results:   ?IMPRESSION: Slightly abnormal MRI scan of the brain showing prominent changes of chronic microvascular ischemia.  No acute abnormalities noted.  Overall no significant change compared with prior MRI scan dated 05/23/2014. ?  ?For her sleep apnea, she is followed by Dr.  Brett Fairy and the nurse practitioner.  She reports 50% compliance with her CPAP.  Sometimes acid reflux bothers her at night which bothers her CPAP usage. ?  ?She is followed by Dr. Reece Levy in psychiatry. ? ?08/26/20 (Dr. Jannifer Franklin): << Ms. Kopec is a 66 year old right-handed black female with a history of Parkinson's disease, depression, diabetes, and sleep apnea.  The patient is currently in physical therapy twice weekly.  She has not had any falls in about 2 weeks.  She claims that she has gained improvement with taking the Sinemet CR more frequently, but she still has problems with wearing off around 2 PM.  The patient may freeze up and fall at times.  The patient may use a cane or walker for ambulation.  She is tolerating medications fairly well, she denies any excessive daytime drowsiness currently.  She returns to the office today for further evaluation.>>  ? ?Her Past Medical History Is Significant For: ?Past Medical History:  ?Diagnosis Date  ? Anemia   ? Anxiety   ? Arthritis   ? L HIP  ? Asthma   ? Avascular necrosis of hip ([place])   ? LEFT  ? Back pain   ? Chest pain   ? Constipation   ? Depression   ? Diabetes mellitus   ? Diabetes mellitus, type II ([place])   ? Dry cough   ? Edema, lower extremity   ? Gait abnormality 11/18/2019  ? GERD (gastroesophageal reflux disease)   ? High cholesterol   ? Hypertension   ? Insomnia   ? takes Ambien nightly  ? Joint pain   ? Kidney disease   ? Kidney disease   ? Obesity   ? Parkinsonism ([place]) 03/02/2017  ? PONV (postoperative nausea and vomiting)   ? RLS (restless legs syndrome) 01/15/2018  ? Sleep apnea   ? Swallowing difficulty   ? Thyroid disease   ? TIA (transient ischemic attack) 2012  ? no residual problems  ? Urgency incontinence   ? Vitamin D deficiency   ? ? ?Her Past Surgical History Is Significant For: ?Past Surgical History:  ?Procedure Laterality Date  ? DILATION AND CURETTAGE OF UTERUS    ? ESOPHAGEAL MANOMETRY N/A 09/03/2012  ? Procedure: ESOPHAGEAL MANOMETRY  (EM);  Surgeon: Garlan Fair, MD;  Location: [facility];  Service: Endoscopy;  Laterality: N/A;  ? ESOPHAGEAL MANOMETRY N/A 06/19/2017  ? Procedure: ESOPHAGEAL MANOMETRY (EM);  Surgeon: Clarene Essex, MD;  Location: [facility];  Service: Endoscopy;  Laterality: N/A;  ? EXPLORATORY LAPAROTOMY    ? GANGLION CYST EXCISION    ? JOINT REPLACEMENT  2011  ? rt total hip  ? KNEE ARTHROSCOPY    ? PILONIDAL CYST / SINUS EXCISION    ? TOTAL HIP ARTHROPLASTY Left 10/09/2013  ? Procedure: LEFT TOTAL HIP ARTHROPLASTY ANTERIOR APPROACH;  Surgeon: [name], MD;  Location: [facility];  Service: Orthopedics;  Laterality: Left;  ? ? ?  Her Family History Is Significant For: ?Family History  ?Problem Relation Age of Onset  ? High blood pressure Mother   ? Hyperlipidemia Mother   ? Heart disease Mother   ? Heart disease Father   ? Alcoholism Father   ? Allergic rhinitis Sister   ? Alcohol abuse Brother   ? Alcohol abuse Brother   ? Alcohol abuse Brother   ? Alcohol abuse Brother   ? Diabetes Neg Hx   ? Angioedema Neg Hx   ? Asthma Neg Hx   ? Eczema Neg Hx   ? Immunodeficiency Neg Hx   ? Urticaria Neg Hx   ? Breast cancer Neg Hx   ? Parkinson's disease Neg Hx   ? Sleep apnea Neg Hx   ? ? ?Her Social History Is Significant For: ?Social History  ? ?Socioeconomic History  ? Marital status: Married  ?  Spouse name: Simona Huh  ? Number of children: Not on file  ? Years of education: Not on file  ? Highest education level: Not on file  ?Occupational History  ? Occupation: retired Therapist, sports  ?Tobacco Use  ? Smoking status: Never  ? Smokeless tobacco: Never  ?Vaping Use  ? Vaping Use: Never used  ?Substance and Sexual Activity  ? Alcohol use: No  ?  Alcohol/week: 0.0 standard drinks  ? Drug use: No  ? Sexual activity: Not Currently  ?Other Topics Concern  ? Not on file  ?Social History Narrative  ? Lives with husband  ? Caffeine use: none  ? Right handed   ? ?Social Determinants of Health  ? ?Financial Resource Strain: Not on file  ?Food Insecurity:  Not on file  ?Transportation Needs: Not on file  ?Physical Activity: Not on file  ?Stress: Not on file  ?Social Connections: Not on file  ? ? ?Her Allergies Are:  ?Allergies  ?Allergen Reactions  ? Fluticasone-[place]

## 2021-08-05 NOTE — Patient Instructions (Signed)
It was nice to see you again today.  We can maintain you on the current regimen for your Parkinson's disease/parkinsonism, please follow-up as scheduled with Ward Givens, NP in September. ?

## 2021-08-09 ENCOUNTER — Other Ambulatory Visit (HOSPITAL_COMMUNITY): Payer: Self-pay

## 2021-08-10 ENCOUNTER — Other Ambulatory Visit: Payer: Self-pay

## 2021-08-12 ENCOUNTER — Other Ambulatory Visit (HOSPITAL_BASED_OUTPATIENT_CLINIC_OR_DEPARTMENT_OTHER): Payer: Self-pay

## 2021-08-12 ENCOUNTER — Telehealth: Payer: Self-pay | Admitting: Neurology

## 2021-08-12 ENCOUNTER — Encounter: Payer: Self-pay | Admitting: Adult Health

## 2021-08-12 MED ORDER — CARBIDOPA-LEVODOPA ER 50-200 MG PO TBCR
EXTENDED_RELEASE_TABLET | ORAL | 1 refills | Status: DC
  Filled 2021-08-12 – 2021-08-23 (×2): qty 450, 90d supply, fill #0
  Filled 2022-01-14: qty 450, 90d supply, fill #1

## 2021-08-12 NOTE — Telephone Encounter (Signed)
Pt request refill for carbidopa-levodopa (SINEMET CR) 50-200 MG tablet at [facility] ?

## 2021-08-12 NOTE — Telephone Encounter (Signed)
Refills sent to pharmacy. 

## 2021-08-12 NOTE — Telephone Encounter (Signed)
Error

## 2021-08-13 ENCOUNTER — Other Ambulatory Visit: Payer: Self-pay | Admitting: Neurology

## 2021-08-13 ENCOUNTER — Other Ambulatory Visit (HOSPITAL_BASED_OUTPATIENT_CLINIC_OR_DEPARTMENT_OTHER): Payer: Self-pay

## 2021-08-16 ENCOUNTER — Other Ambulatory Visit (HOSPITAL_BASED_OUTPATIENT_CLINIC_OR_DEPARTMENT_OTHER): Payer: Self-pay

## 2021-08-16 ENCOUNTER — Telehealth: Payer: Self-pay | Admitting: Neurology

## 2021-08-16 MED ORDER — CARBIDOPA-LEVODOPA 25-250 MG PO TABS
ORAL_TABLET | ORAL | 1 refills | Status: DC
Start: 2021-08-16 — End: 2022-02-22
  Filled 2021-08-16: qty 180, 90d supply, fill #0
  Filled 2021-11-17: qty 180, 90d supply, fill #1

## 2021-08-16 NOTE — Telephone Encounter (Signed)
Pt is requesting a refill for carbidopa-levodopa (SINEMET) 25-250 MG tablet. ? ?Pharmacy: [facility] ? ?

## 2021-08-16 NOTE — Telephone Encounter (Signed)
Refill sent.

## 2021-08-18 ENCOUNTER — Ambulatory Visit (INDEPENDENT_AMBULATORY_CARE_PROVIDER_SITE_OTHER): Payer: 59 | Admitting: Nurse Practitioner

## 2021-08-23 ENCOUNTER — Other Ambulatory Visit (HOSPITAL_BASED_OUTPATIENT_CLINIC_OR_DEPARTMENT_OTHER): Payer: Self-pay

## 2021-08-23 ENCOUNTER — Telehealth: Payer: Self-pay

## 2021-08-23 ENCOUNTER — Other Ambulatory Visit (HOSPITAL_COMMUNITY): Payer: Self-pay

## 2021-08-23 DIAGNOSIS — G20 Parkinson's disease: Secondary | ICD-10-CM

## 2021-08-23 DIAGNOSIS — G4733 Obstructive sleep apnea (adult) (pediatric): Secondary | ICD-10-CM

## 2021-08-23 NOTE — Telephone Encounter (Signed)
I spoke with the patient and her family member and discussed the change in pressure settings that [facility] NP has ordered.  The patient will get the new settings a try.  Their questions were answered. I went ahead and made the change in [facility] and sent the order to [facility], pt's DME.

## 2021-08-23 NOTE — Telephone Encounter (Signed)
I have changed her pressure 4 to 10 cm of water.  She can try this and see if it helps.

## 2021-08-23 NOTE — Telephone Encounter (Signed)
Patient left a voicemail this morning asking for a call to discuss an appointment she has for today at 1 PM for her CPAP with the doctor or nurse practitioner.  Please return her call.

## 2021-08-23 NOTE — Addendum Note (Signed)
Addended by: Trudie Buckler on: 08/23/2021 11:00 AM   Modules accepted: Orders

## 2021-08-23 NOTE — Telephone Encounter (Signed)
Reviewed pt's appt desk. I do not see any pending or canceled appts for today or even surrounding days. Pt verbalized understanding. Her next appt is 12/08/21 at 11:00 arrival 1030. She added update that she cannot use her CPAP. She states she has too much regurgitation. When she uses it she coughs up water or food. I let her know a message would be sent to [facility] NP.

## 2021-08-24 ENCOUNTER — Ambulatory Visit (INDEPENDENT_AMBULATORY_CARE_PROVIDER_SITE_OTHER): Payer: 59 | Admitting: Nurse Practitioner

## 2021-08-24 ENCOUNTER — Encounter (INDEPENDENT_AMBULATORY_CARE_PROVIDER_SITE_OTHER): Payer: Self-pay | Admitting: Nurse Practitioner

## 2021-08-24 ENCOUNTER — Other Ambulatory Visit (HOSPITAL_BASED_OUTPATIENT_CLINIC_OR_DEPARTMENT_OTHER): Payer: Self-pay

## 2021-08-24 VITALS — BP 111/75 | HR 80 | Temp 97.6°F | Ht 66.0 in | Wt 213.0 lb

## 2021-08-24 DIAGNOSIS — E1159 Type 2 diabetes mellitus with other circulatory complications: Secondary | ICD-10-CM

## 2021-08-24 DIAGNOSIS — E1122 Type 2 diabetes mellitus with diabetic chronic kidney disease: Secondary | ICD-10-CM | POA: Diagnosis not present

## 2021-08-24 DIAGNOSIS — E669 Obesity, unspecified: Secondary | ICD-10-CM | POA: Diagnosis not present

## 2021-08-24 DIAGNOSIS — E042 Nontoxic multinodular goiter: Secondary | ICD-10-CM | POA: Diagnosis not present

## 2021-08-24 DIAGNOSIS — I152 Hypertension secondary to endocrine disorders: Secondary | ICD-10-CM

## 2021-08-24 DIAGNOSIS — Z6834 Body mass index (BMI) 34.0-34.9, adult: Secondary | ICD-10-CM | POA: Diagnosis not present

## 2021-08-24 DIAGNOSIS — Z794 Long term (current) use of insulin: Secondary | ICD-10-CM | POA: Diagnosis not present

## 2021-08-24 DIAGNOSIS — E1152 Type 2 diabetes mellitus with diabetic peripheral angiopathy with gangrene: Secondary | ICD-10-CM | POA: Diagnosis not present

## 2021-08-24 DIAGNOSIS — N1831 Chronic kidney disease, stage 3a: Secondary | ICD-10-CM | POA: Diagnosis not present

## 2021-08-24 DIAGNOSIS — G4733 Obstructive sleep apnea (adult) (pediatric): Secondary | ICD-10-CM | POA: Diagnosis not present

## 2021-08-24 DIAGNOSIS — I700 Atherosclerosis of aorta: Secondary | ICD-10-CM | POA: Diagnosis not present

## 2021-08-24 DIAGNOSIS — E1151 Type 2 diabetes mellitus with diabetic peripheral angiopathy without gangrene: Secondary | ICD-10-CM | POA: Diagnosis not present

## 2021-08-24 DIAGNOSIS — N1832 Chronic kidney disease, stage 3b: Secondary | ICD-10-CM

## 2021-08-24 MED ORDER — WEGOVY 2.4 MG/0.75ML ~~LOC~~ SOAJ
SUBCUTANEOUS | 4 refills | Status: DC
  Filled 2021-08-24: qty 3, 28d supply, fill #0
  Filled 2021-09-22: qty 3, 28d supply, fill #1
  Filled 2021-10-14: qty 3, 28d supply, fill #2
  Filled 2021-11-16: qty 3, 28d supply, fill #3
  Filled 2021-12-29: qty 3, 28d supply, fill #4
  Filled 2022-02-02: qty 3, 28d supply, fill #5
  Filled 2022-03-02: qty 3, 28d supply, fill #6
  Filled 2022-04-07: qty 3, 28d supply, fill #7

## 2021-08-25 ENCOUNTER — Other Ambulatory Visit (HOSPITAL_BASED_OUTPATIENT_CLINIC_OR_DEPARTMENT_OTHER): Payer: Self-pay

## 2021-08-26 ENCOUNTER — Other Ambulatory Visit (HOSPITAL_BASED_OUTPATIENT_CLINIC_OR_DEPARTMENT_OTHER): Payer: Self-pay

## 2021-08-26 ENCOUNTER — Other Ambulatory Visit (HOSPITAL_COMMUNITY): Payer: Self-pay

## 2021-08-26 DIAGNOSIS — Z794 Long term (current) use of insulin: Secondary | ICD-10-CM | POA: Diagnosis not present

## 2021-08-26 DIAGNOSIS — E1165 Type 2 diabetes mellitus with hyperglycemia: Secondary | ICD-10-CM | POA: Diagnosis not present

## 2021-08-31 ENCOUNTER — Other Ambulatory Visit (HOSPITAL_BASED_OUTPATIENT_CLINIC_OR_DEPARTMENT_OTHER): Payer: Self-pay

## 2021-09-01 ENCOUNTER — Other Ambulatory Visit (HOSPITAL_BASED_OUTPATIENT_CLINIC_OR_DEPARTMENT_OTHER): Payer: Self-pay

## 2021-09-01 DIAGNOSIS — I129 Hypertensive chronic kidney disease with stage 1 through stage 4 chronic kidney disease, or unspecified chronic kidney disease: Secondary | ICD-10-CM | POA: Diagnosis not present

## 2021-09-01 DIAGNOSIS — D631 Anemia in chronic kidney disease: Secondary | ICD-10-CM | POA: Diagnosis not present

## 2021-09-01 DIAGNOSIS — N1831 Chronic kidney disease, stage 3a: Secondary | ICD-10-CM | POA: Diagnosis not present

## 2021-09-01 DIAGNOSIS — E559 Vitamin D deficiency, unspecified: Secondary | ICD-10-CM | POA: Diagnosis not present

## 2021-09-01 NOTE — Progress Notes (Signed)
Chief Complaint:   OBESITY Deanna Rowe is here to discuss her progress with her obesity treatment plan along with follow-up of her obesity related diagnoses. Deanna Rowe is on the Category 3 Plan and states she is following her eating plan approximately 50% of the time. Deanna Rowe states she is biking and walking 30 minutes 7 times per week.  Today's visit was #: 26 Starting weight: 237 lbs Starting date: 04/16/2019 Today's weight: 213 lbs Today's date: 08/24/2021 Total lbs lost to date: 24 Total lbs lost since last in-office visit: 1  Interim History: Pt reports she has fallen once since her last visit when trying to get out of her recliner. She celebrated Mother's Day and her husband's birthday since her last visit. Pt has gotten off track. She likes category 3 plan. Pt denies any upcoming celebrations. She is going to [place] in October.  Subjective:   1. Hypertension associated with diabetes ([place]) BP looks good today. Pt is taking Norvasc 5 mg, Cozaar 100 mg, and Propranolol 40 mg. She denies side effects. Pt also denies chest pain/palpitations/dizziness.  2. Type 2 diabetes mellitus with stage 3b chronic kidney disease, with long-term current use of insulin ([facility]) Deanna Rowe sees Dr. Buddy Duty on a regular basis. She is on Humalog- sliding scale, an ARB, and a statin. Pt's fasting blood sugars range 90-105. She denies hypoglycemic episodes. Her last A1c was checked today when she saw Dr. Buddy Duty. He discontinued her Ozempic. Pt is to start [facility]. She is up to date on eye exam.  3. Severe obstructive sleep apnea Pt reports wearing her CPAP for about 4 hours last night. That was the first time she has been able to use her CPAP in the last 2 months. She felt better upon awakening.  Assessment/Plan:   1. Hypertension associated with diabetes (Deanna Rowe) Deanna Rowe is working on healthy weight loss and exercise to improve blood pressure control. We will watch for signs of hypotension as she continues her  lifestyle modifications. Continue to follow up with PCP, and continue meds as directed.  2. Type 2 diabetes mellitus with stage 3b chronic kidney disease, with long-term current use of insulin (HCC) Good blood sugar control is important to decrease the likelihood of diabetic complications such as nephropathy, neuropathy, limb loss, blindness, coronary artery disease, and death. Intensive lifestyle modification including diet, exercise and weight loss are the first line of treatment for diabetes. Continue to follow up with Dr. Buddy Duty, and continue meds as directed.  3. Severe obstructive sleep apnea Intensive lifestyle modifications are the first line treatment for this issue. We discussed several lifestyle modifications today and she will continue to work on diet, exercise and weight loss efforts. We will continue to monitor. Orders and follow up as documented in patient record. Continue to wear CPAP nightly.  4. Obesity: BMI 34.4 Deanna Rowe is currently in the action stage of change. As such, her goal is to continue with weight loss efforts. She has agreed to the Category 3 Plan.   Exercise goals:  As is  Behavioral modification strategies: no skipping meals, avoiding temptations, and planning for success.  Deanna Rowe has agreed to follow-up with our clinic in 4-6 weeks (pt will come fasting for repeat IC). She was informed of the importance of frequent follow-up visits to maximize her success with intensive lifestyle modifications for her multiple health conditions.   Objective:   Blood pressure 111/75, pulse 80, temperature 97.6 F (36.4 C), height '5\' 6"'$  (1.676 m), weight 213 lb (96.6 kg), SpO2  94 %. Body mass index is 34.38 kg/m.  General: Cooperative, alert, well developed, in no acute distress. HEENT: Conjunctivae and lids unremarkable. Cardiovascular: Regular rhythm.  Lungs: Normal work of breathing. Neurologic: No focal deficits.   Lab Results  Component Value Date   CREATININE 1.43  (H) 03/02/2020   BUN 13 03/02/2020   NA 136 03/02/2020   K 4.7 03/02/2020   CL 98 03/02/2020   CO2 27 03/02/2020   Lab Results  Component Value Date   ALT 21 04/16/2019   AST 23 04/16/2019   ALKPHOS 58 04/16/2019   BILITOT 0.3 04/16/2019   Lab Results  Component Value Date   HGBA1C 7.9 (H) 04/16/2019   HGBA1C 6.9 11/25/2016   HGBA1C 6.0 06/10/2016   HGBA1C 5.9 02/12/2016   HGBA1C 6.1 12/01/2015   Lab Results  Component Value Date   INSULIN 12.3 04/16/2019   Lab Results  Component Value Date   TSH 1.780 04/16/2019   Lab Results  Component Value Date   CHOL 155 04/16/2019   HDL 84 04/16/2019   LDLCALC 57 04/16/2019   TRIG 71 04/16/2019   Lab Results  Component Value Date   VD25OH 44.8 12/17/2019   VD25OH 40.9 04/16/2019   Lab Results  Component Value Date   WBC 6.8 04/16/2019   HGB 12.3 04/16/2019   HCT 36.1 04/16/2019   MCV 96 04/16/2019   PLT 274 04/16/2019    Attestation Statements:   Reviewed by clinician on day of visit: allergies, medications, problem list, medical history, surgical history, family history, social history, and previous encounter notes.  Time spent on visit including pre-visit chart review and post-visit care and charting was 30 minutes.   I, Kathlene November, BS, CMA, am acting as transcriptionist for Everardo Pacific, FNP.  I have reviewed the above documentation for accuracy and completeness, and I agree with the above. Everardo Pacific, FNP

## 2021-09-02 ENCOUNTER — Other Ambulatory Visit (HOSPITAL_COMMUNITY): Payer: Self-pay

## 2021-09-02 ENCOUNTER — Other Ambulatory Visit (HOSPITAL_BASED_OUTPATIENT_CLINIC_OR_DEPARTMENT_OTHER): Payer: Self-pay

## 2021-09-07 ENCOUNTER — Other Ambulatory Visit (HOSPITAL_COMMUNITY): Payer: Self-pay

## 2021-09-22 ENCOUNTER — Other Ambulatory Visit (HOSPITAL_BASED_OUTPATIENT_CLINIC_OR_DEPARTMENT_OTHER): Payer: Self-pay

## 2021-09-23 ENCOUNTER — Other Ambulatory Visit (HOSPITAL_COMMUNITY): Payer: Self-pay

## 2021-09-25 DIAGNOSIS — E1165 Type 2 diabetes mellitus with hyperglycemia: Secondary | ICD-10-CM | POA: Diagnosis not present

## 2021-09-25 DIAGNOSIS — Z794 Long term (current) use of insulin: Secondary | ICD-10-CM | POA: Diagnosis not present

## 2021-09-29 ENCOUNTER — Other Ambulatory Visit (HOSPITAL_BASED_OUTPATIENT_CLINIC_OR_DEPARTMENT_OTHER): Payer: Self-pay

## 2021-09-30 ENCOUNTER — Other Ambulatory Visit (HOSPITAL_BASED_OUTPATIENT_CLINIC_OR_DEPARTMENT_OTHER): Payer: Self-pay

## 2021-09-30 ENCOUNTER — Ambulatory Visit (INDEPENDENT_AMBULATORY_CARE_PROVIDER_SITE_OTHER): Payer: 59 | Admitting: Nurse Practitioner

## 2021-09-30 MED ORDER — GLUCOSE BLOOD VI STRP
ORAL_STRIP | 3 refills | Status: AC
  Filled 2021-09-30: qty 300, 75d supply, fill #0
  Filled 2021-12-31: qty 300, 75d supply, fill #1

## 2021-10-01 ENCOUNTER — Other Ambulatory Visit (HOSPITAL_COMMUNITY): Payer: Self-pay

## 2021-10-03 ENCOUNTER — Other Ambulatory Visit (HOSPITAL_BASED_OUTPATIENT_CLINIC_OR_DEPARTMENT_OTHER): Payer: Self-pay

## 2021-10-04 ENCOUNTER — Ambulatory Visit (INDEPENDENT_AMBULATORY_CARE_PROVIDER_SITE_OTHER): Payer: 59 | Admitting: Nurse Practitioner

## 2021-10-04 ENCOUNTER — Other Ambulatory Visit (HOSPITAL_COMMUNITY): Payer: Self-pay

## 2021-10-04 ENCOUNTER — Other Ambulatory Visit (HOSPITAL_BASED_OUTPATIENT_CLINIC_OR_DEPARTMENT_OTHER): Payer: Self-pay

## 2021-10-04 MED ORDER — ROPINIROLE HCL 2 MG PO TABS
ORAL_TABLET | ORAL | 0 refills | Status: DC
  Filled 2021-10-04: qty 60, 30d supply, fill #0

## 2021-10-08 ENCOUNTER — Other Ambulatory Visit (HOSPITAL_BASED_OUTPATIENT_CLINIC_OR_DEPARTMENT_OTHER): Payer: Self-pay

## 2021-10-11 ENCOUNTER — Other Ambulatory Visit (HOSPITAL_BASED_OUTPATIENT_CLINIC_OR_DEPARTMENT_OTHER): Payer: Self-pay

## 2021-10-11 DIAGNOSIS — N644 Mastodynia: Secondary | ICD-10-CM | POA: Diagnosis not present

## 2021-10-11 DIAGNOSIS — R079 Chest pain, unspecified: Secondary | ICD-10-CM | POA: Diagnosis not present

## 2021-10-12 ENCOUNTER — Other Ambulatory Visit: Payer: Self-pay | Admitting: Family Medicine

## 2021-10-12 ENCOUNTER — Other Ambulatory Visit (HOSPITAL_BASED_OUTPATIENT_CLINIC_OR_DEPARTMENT_OTHER): Payer: Self-pay

## 2021-10-12 ENCOUNTER — Ambulatory Visit (INDEPENDENT_AMBULATORY_CARE_PROVIDER_SITE_OTHER): Payer: 59 | Admitting: Nurse Practitioner

## 2021-10-12 VITALS — BP 98/64 | HR 91 | Temp 98.0°F | Ht 66.0 in | Wt 213.0 lb

## 2021-10-12 DIAGNOSIS — Z7985 Long-term (current) use of injectable non-insulin antidiabetic drugs: Secondary | ICD-10-CM | POA: Diagnosis not present

## 2021-10-12 DIAGNOSIS — E1122 Type 2 diabetes mellitus with diabetic chronic kidney disease: Secondary | ICD-10-CM | POA: Diagnosis not present

## 2021-10-12 DIAGNOSIS — E669 Obesity, unspecified: Secondary | ICD-10-CM | POA: Diagnosis not present

## 2021-10-12 DIAGNOSIS — N6310 Unspecified lump in the right breast, unspecified quadrant: Secondary | ICD-10-CM

## 2021-10-12 DIAGNOSIS — Z794 Long term (current) use of insulin: Secondary | ICD-10-CM | POA: Diagnosis not present

## 2021-10-12 DIAGNOSIS — N644 Mastodynia: Secondary | ICD-10-CM

## 2021-10-12 DIAGNOSIS — N1832 Chronic kidney disease, stage 3b: Secondary | ICD-10-CM

## 2021-10-12 DIAGNOSIS — Z6834 Body mass index (BMI) 34.0-34.9, adult: Secondary | ICD-10-CM | POA: Diagnosis not present

## 2021-10-12 MED ORDER — GABAPENTIN 300 MG PO CAPS
300.0000 mg | ORAL_CAPSULE | Freq: Two times a day (BID) | ORAL | 0 refills | Status: DC
  Filled 2021-10-12: qty 180, 90d supply, fill #0

## 2021-10-13 ENCOUNTER — Other Ambulatory Visit (HOSPITAL_BASED_OUTPATIENT_CLINIC_OR_DEPARTMENT_OTHER): Payer: Self-pay

## 2021-10-13 DIAGNOSIS — F3181 Bipolar II disorder: Secondary | ICD-10-CM | POA: Diagnosis not present

## 2021-10-13 MED ORDER — REXULTI 2 MG PO TABS
2.0000 mg | ORAL_TABLET | Freq: Every morning | ORAL | 1 refills | Status: DC
  Filled 2021-10-13 – 2021-12-01 (×2): qty 90, 90d supply, fill #0
  Filled 2022-09-21: qty 90, 90d supply, fill #1
  Filled 2022-09-21: qty 30, 30d supply, fill #1
  Filled 2022-09-21: qty 60, 60d supply, fill #1

## 2021-10-13 MED ORDER — BUPROPION HCL ER (XL) 150 MG PO TB24
450.0000 mg | ORAL_TABLET | Freq: Every morning | ORAL | 1 refills | Status: DC
  Filled 2021-10-13: qty 270, 90d supply, fill #0
  Filled 2022-01-10: qty 270, 90d supply, fill #1

## 2021-10-13 MED ORDER — ZOLPIDEM TARTRATE 5 MG PO TABS
5.0000 mg | ORAL_TABLET | Freq: Every day | ORAL | 1 refills | Status: DC
  Filled 2021-10-13: qty 90, 90d supply, fill #0
  Filled 2021-12-24 – 2022-01-07 (×3): qty 90, 90d supply, fill #1

## 2021-10-13 MED ORDER — LORAZEPAM 2 MG PO TABS
2.0000 mg | ORAL_TABLET | Freq: Three times a day (TID) | ORAL | 1 refills | Status: DC
  Filled 2021-10-13: qty 270, 90d supply, fill #0
  Filled 2022-01-17: qty 77, 26d supply, fill #1
  Filled 2022-01-17: qty 193, 64d supply, fill #1

## 2021-10-13 MED ORDER — ROPINIROLE HCL 2 MG PO TABS
2.0000 mg | ORAL_TABLET | Freq: Two times a day (BID) | ORAL | 1 refills | Status: DC
  Filled 2021-10-13 – 2021-12-27 (×2): qty 90, 45d supply, fill #0
  Filled 2022-02-22: qty 90, 45d supply, fill #1

## 2021-10-13 MED ORDER — TRINTELLIX 20 MG PO TABS
20.0000 mg | ORAL_TABLET | Freq: Every morning | ORAL | 1 refills | Status: DC
Start: 2021-10-13 — End: 2022-03-24
  Filled 2021-10-13 – 2021-10-14 (×2): qty 90, 90d supply, fill #0
  Filled 2022-02-02: qty 90, 90d supply, fill #1

## 2021-10-13 NOTE — Progress Notes (Unsigned)
Chief Complaint:   OBESITY Deanna Rowe is here to discuss her progress with her obesity treatment plan along with follow-up of her obesity related diagnoses. Deanna Rowe is on the Category 3 Plan and states she is following her eating plan approximately 50% of the time. Deanna Rowe states she is riding bicycle 20 minutes 5 times per week.  Today's visit was #: 47  Starting weight: 237 lbs Starting date: 04/16/2019 Today's weight: 213 lbs Today's date: 10/12/2021 Total lbs lost to date: 24 lbs Total lbs lost since last in-office visit: 0  Interim History: Deanna Rowe was recently changed from [place] to [place]. She does not feel like Mancel Parsons is helping with appetite and cravings as well as Ozempic did. She is waking up during the night eating. Snack in the middle of the night are "junk". She is eating 2 meals and 1 snack daily. She reports that her struggles include hunger and cravings.  Subjective:   1. Type 2 diabetes mellitus with stage 3b chronic kidney disease, with long-term current use of insulin ([place]) Managed by Dr. Buddy Duty. Deanna Rowe is currently on Humalog sliding scale, ARB and statin. Her fasting blood sugars--80's-120's, hypoglycemia a couple of times ***.  She is taking Wegovy, and recently stopped Ozempic to take [facility]. Blood sugars at dinner ---****. She feels that her blood sugars are higher since switching to [facility].   Assessment/Plan:   1. Type 2 diabetes mellitus with stage 3b chronic kidney disease, with long-term current use of insulin (HCC) Deanna Rowe will reach out to Dr. Buddy Duty today.   2. Obesity: BMI 34.4 Deanna Rowe is currently in the action stage of change. As such, her goal is to continue with weight loss efforts. She has agreed to the Category 3 Plan.   Exercise goals: As is.  Behavioral modification strategies: increasing lean protein intake, increasing water intake, and keeping a strict food journal.  Deanna Rowe has agreed to follow-up with our clinic in 4 weeks. She was  informed of the importance of frequent follow-up visits to maximize her success with intensive lifestyle modifications for her multiple health conditions.   Objective:   Blood pressure 98/64, pulse 91, temperature 98 F (36.7 C), height '5\' 6"'$  (1.676 m), weight 213 lb (96.6 kg), SpO2 97 %. Body mass index is 34.38 kg/m.  General: Cooperative, alert, well developed, in no acute distress. HEENT: Conjunctivae and lids unremarkable. Cardiovascular: Regular rhythm.  Lungs: Normal work of breathing. Neurologic: No focal deficits.   Lab Results  Component Value Date   CREATININE 1.43 (H) 03/02/2020   BUN 13 03/02/2020   NA 136 03/02/2020   K 4.7 03/02/2020   CL 98 03/02/2020   CO2 27 03/02/2020   Lab Results  Component Value Date   ALT 21 04/16/2019   AST 23 04/16/2019   ALKPHOS 58 04/16/2019   BILITOT 0.3 04/16/2019   Lab Results  Component Value Date   HGBA1C 7.9 (H) 04/16/2019   HGBA1C 6.9 11/25/2016   HGBA1C 6.0 06/10/2016   HGBA1C 5.9 02/12/2016   HGBA1C 6.1 12/01/2015   Lab Results  Component Value Date   INSULIN 12.3 04/16/2019   Lab Results  Component Value Date   TSH 1.780 04/16/2019   Lab Results  Component Value Date   CHOL 155 04/16/2019   HDL 84 04/16/2019   LDLCALC 57 04/16/2019   TRIG 71 04/16/2019   Lab Results  Component Value Date   VD25OH 44.8 12/17/2019   VD25OH 40.9 04/16/2019   Lab Results  Component Value  Date   WBC 6.8 04/16/2019   HGB 12.3 04/16/2019   HCT 36.1 04/16/2019   MCV 96 04/16/2019   PLT 274 04/16/2019   No results found for: "IRON", "TIBC", "FERRITIN"  Attestation Statements:   Reviewed by clinician on day of visit: allergies, medications, problem list, medical history, surgical history, family history, social history, and previous encounter notes.  Time spent on visit including pre-visit chart review and post-visit care and charting was 30 minutes.   I, Brendell Tyus, RMA , am acting as transcriptionist for  Everardo Pacific, FNP.  I have reviewed the above documentation for accuracy and completeness, and I agree with the above. -  ***

## 2021-10-14 ENCOUNTER — Other Ambulatory Visit (HOSPITAL_BASED_OUTPATIENT_CLINIC_OR_DEPARTMENT_OTHER): Payer: Self-pay

## 2021-10-20 ENCOUNTER — Ambulatory Visit
Admission: RE | Admit: 2021-10-20 | Discharge: 2021-10-20 | Disposition: A | Discharge status: Home / Self Care | Payer: 59 | Source: Ambulatory Visit | Attending: Family Medicine | Admitting: Family Medicine

## 2021-10-20 DIAGNOSIS — N644 Mastodynia: Secondary | ICD-10-CM

## 2021-10-20 DIAGNOSIS — N6310 Unspecified lump in the right breast, unspecified quadrant: Secondary | ICD-10-CM

## 2021-10-20 DIAGNOSIS — R922 Inconclusive mammogram: Secondary | ICD-10-CM | POA: Diagnosis not present

## 2021-10-20 DIAGNOSIS — N6312 Unspecified lump in the right breast, upper inner quadrant: Secondary | ICD-10-CM | POA: Diagnosis not present

## 2021-10-21 ENCOUNTER — Other Ambulatory Visit (HOSPITAL_COMMUNITY): Payer: Self-pay

## 2021-10-21 ENCOUNTER — Other Ambulatory Visit (HOSPITAL_BASED_OUTPATIENT_CLINIC_OR_DEPARTMENT_OTHER): Payer: Self-pay

## 2021-10-21 DIAGNOSIS — N3941 Urge incontinence: Secondary | ICD-10-CM | POA: Diagnosis not present

## 2021-10-21 MED ORDER — SOLIFENACIN SUCCINATE 10 MG PO TABS: ORAL_TABLET | ORAL | 3 refills | Status: DC

## 2021-10-21 MED ORDER — MYRBETRIQ 50 MG PO TB24
ORAL_TABLET | ORAL | 3 refills | Status: DC
Start: 2021-10-21 — End: 2022-11-18
  Filled 2021-10-21 – 2021-12-31 (×2): qty 90, 90d supply, fill #0
  Filled 2022-04-06: qty 90, 90d supply, fill #1
  Filled 2022-06-10 – 2022-06-20 (×2): qty 90, 90d supply, fill #2
  Filled 2022-09-26: qty 90, 90d supply, fill #3

## 2021-10-27 ENCOUNTER — Other Ambulatory Visit (HOSPITAL_BASED_OUTPATIENT_CLINIC_OR_DEPARTMENT_OTHER): Payer: Self-pay

## 2021-10-28 ENCOUNTER — Other Ambulatory Visit (HOSPITAL_COMMUNITY): Payer: Self-pay

## 2021-11-03 ENCOUNTER — Ambulatory Visit: Payer: 59 | Admitting: Family Medicine

## 2021-11-03 NOTE — Progress Notes (Deleted)
   [place] [address] Dept: [phone]  FOLLOW UP NOTE  Patient ID: Deanna Rowe, female    DOB: 08-07-1955  Age: 66 y.o. MRN: [id number] Date of Office Visit: 11/03/2021  Assessment  Chief Complaint: No chief complaint on file.  HPI Deanna Rowe is a 66 year old female who presents the clinic for follow-up visit.  She was last seen in this clinic on 03/30/2021 by Gareth Morgan, FNP, for evaluation of asthma, allergic rhinitis, allergic conjunctivitis, and reflux.  Her last environmental allergy skin testing was on 02/08/2018 and was positive to dust mite, cat, and dog.   Drug Allergies:  Allergies  Allergen Reactions   Fluticasone-Salmeterol Anaphylaxis   Codeine Nausea And Vomiting   Fetzima [Levomilnacipran] Nausea And Vomiting   Nsaids Other (See Comments)    Upset stomach    Trulicity [Dulaglutide] Nausea And Vomiting    Significant and undesirably rapid (40 lbs) weight loss    Xanax [Alprazolam] Other (See Comments)    disoriented   Amoxicillin Rash    Has patient had a PCN reaction causing immediate rash, facial/tongue/throat swelling, SOB or lightheadedness with hypotension: Yes Has patient had a PCN reaction causing severe rash involving mucus membranes or skin necrosis: No Has patient had a PCN reaction that required hospitalization: No Has patient had a PCN reaction occurring within the last 10 years: No If all of the above answers are "NO", then may proceed with Cephalosporin use.    Pseudoephedrine Hcl Er Palpitations    Physical Exam: There were no vitals taken for this visit.   Physical Exam  Diagnostics:    Assessment and Plan: No diagnosis found.  No orders of the defined types were placed in this encounter.   There are no Patient Instructions on file for this visit.  No follow-ups on file.    Thank you for the opportunity to care for this patient.  Please do not hesitate to contact me with questions.  Gareth Morgan,  FNP [facility] [place] [facility]

## 2021-11-04 ENCOUNTER — Other Ambulatory Visit (HOSPITAL_BASED_OUTPATIENT_CLINIC_OR_DEPARTMENT_OTHER): Payer: Self-pay

## 2021-11-04 ENCOUNTER — Ambulatory Visit (INDEPENDENT_AMBULATORY_CARE_PROVIDER_SITE_OTHER): Payer: 59 | Admitting: Nurse Practitioner

## 2021-11-05 ENCOUNTER — Other Ambulatory Visit (HOSPITAL_COMMUNITY): Payer: Self-pay

## 2021-11-08 ENCOUNTER — Other Ambulatory Visit (HOSPITAL_BASED_OUTPATIENT_CLINIC_OR_DEPARTMENT_OTHER): Payer: Self-pay

## 2021-11-08 MED ORDER — AMLODIPINE BESYLATE 5 MG PO TABS
ORAL_TABLET | ORAL | 0 refills | Status: DC
  Filled 2021-11-08: qty 90, 90d supply, fill #0

## 2021-11-09 ENCOUNTER — Other Ambulatory Visit (HOSPITAL_BASED_OUTPATIENT_CLINIC_OR_DEPARTMENT_OTHER): Payer: Self-pay

## 2021-11-09 DIAGNOSIS — Z794 Long term (current) use of insulin: Secondary | ICD-10-CM | POA: Diagnosis not present

## 2021-11-09 DIAGNOSIS — E1165 Type 2 diabetes mellitus with hyperglycemia: Secondary | ICD-10-CM | POA: Diagnosis not present

## 2021-11-10 ENCOUNTER — Encounter (INDEPENDENT_AMBULATORY_CARE_PROVIDER_SITE_OTHER): Payer: Self-pay

## 2021-11-10 NOTE — Patient Instructions (Incomplete)
Asthma We will get a stat PA and lateral chest x-ray due to your productive cough for a week and a low-grade fever days ago 99.3 F Start Symbicort 160/4.5 mcg to-2 puffs twice a day with a spacer to prevent cough or wheeze.  Make sure and use this every day even though you are not having symptoms.  We can set a reminder on a phone or set it next to your toothbrush as a reminder Stop montelukast 10 mg since she could not tell a difference with your symptoms while taking Continue albuterol 2 puffs every 4 hours as needed for cough, wheeze, tightness in chest, or shortness of breath Continue Xolair injections 300 mg once every 4 weeks and have access to an epinephrine auto-injector set.  Refill of her EpiPen sent.   Discussed that I do not think the rash on her hand is a rash and is age spots  Allergic rhinitis Continue Nasacort 2 sprays in each nostril once a day as needed for stuffy nose Continue saline nasal rinses as needed for nasal symptoms. Use this before any medicated nasal sprays for best result Continue azelastine nasal spray 2 sprays in each nostril twice a day as needed for nasal symptoms May use Xyzal 5 mg once a day only as needed for a runny nose or itch Continue allergen avoidance measures directed toward dust mites, cat, and dog as listed below For thick post nasal drainage, begin Mucinex [phone] mg twice a day and increase your fluid intake in order to thin out mucus  Allergic conjunctivitis Some over the counter eye drops include Pataday one drop in each eye once a day as needed for red, itchy eyes OR Zaditor one drop in each eye twice a day as needed for red itchy eyes  Reflux Continue Pepcid as previously prescribed.  Contact your GI doctor about other options since Pepcid is not working and you have tried proton pump inhibitors in the past Continue dietary and lifestyle modifications   Please let us know if this treatment plan is not working well for you  Follow up in 6  to 8 weeks or sooner if needed.  Control of Dog or Cat Allergen Avoidance is the best way to manage a dog or cat allergy. If you have a dog or cat and are allergic to dog or cats, consider removing the dog or cat from the home. If you have a dog or cat but don't want to find it a new home, or if your family wants a pet even though someone in the household is allergic, here are some strategies that may help keep symptoms at bay:  Keep the pet out of your bedroom and restrict it to only a few rooms. Be advised that keeping the dog or cat in only one room will not limit the allergens to that room. Don't pet, hug or kiss the dog or cat; if you do, wash your hands with soap and water. High-efficiency particulate air (HEPA) cleaners run continuously in a bedroom or living room can reduce allergen levels over time. Regular use of a high-efficiency vacuum cleaner or a central vacuum can reduce allergen levels. Giving your dog or cat a bath at least once a week can reduce airborne allergen.   Control of Dust Mite Allergen Dust mites play a major role in allergic asthma and rhinitis. They occur in environments with high humidity wherever human skin is found. Dust mites absorb humidity from the atmosphere (ie, they do not drink)  and feed on organic matter (including shed human and animal skin). Dust mites are a microscopic type of insect that you cannot see with the naked eye. High levels of dust mites have been detected from mattresses, pillows, carpets, upholstered furniture, bed covers, clothes, soft toys and any woven material. The principal allergen of the dust mite is found in its feces. A gram of dust may contain 1,000 mites and 250,000 fecal particles. Mite antigen is easily measured in the air during house cleaning activities. Dust mites do not bite and do not cause harm to humans, other than by triggering allergies/asthma.  Ways to decrease your exposure to dust mites in your home:  1. Encase  mattresses, box springs and pillows with a mite-impermeable barrier or cover  2. Wash sheets, blankets and drapes weekly in hot water (130 F) with detergent and dry them in a dryer on the hot setting.  3. Have the room cleaned frequently with a vacuum cleaner and a damp dust-mop. For carpeting or rugs, vacuuming with a vacuum cleaner equipped with a high-efficiency particulate air (HEPA) filter. The dust mite allergic individual should not be in a room which is being cleaned and should wait 1 hour after cleaning before going into the room.  4. Do not sleep on upholstered furniture (eg, couches).  5. If possible removing carpeting, upholstered furniture and drapery from the home is ideal. Horizontal blinds should be eliminated in the rooms where the person spends the most time (bedroom, study, television room). Washable vinyl, roller-type shades are optimal.  6. Remove all non-washable stuffed toys from the bedroom. Wash stuffed toys weekly like sheets and blankets above.  7. Reduce indoor humidity to less than 50%. Inexpensive humidity monitors can be purchased at most hardware stores. Do not use a humidifier as can make the problem worse and are not recommended.

## 2021-11-11 ENCOUNTER — Encounter: Payer: Self-pay | Admitting: Family

## 2021-11-11 ENCOUNTER — Ambulatory Visit (HOSPITAL_BASED_OUTPATIENT_CLINIC_OR_DEPARTMENT_OTHER)
Admission: RE | Admit: 2021-11-11 | Discharge: 2021-11-11 | Disposition: A | Discharge status: Home / Self Care | Payer: 59 | Source: Ambulatory Visit | Attending: Family | Admitting: Family

## 2021-11-11 ENCOUNTER — Other Ambulatory Visit (HOSPITAL_BASED_OUTPATIENT_CLINIC_OR_DEPARTMENT_OTHER): Payer: Self-pay

## 2021-11-11 ENCOUNTER — Other Ambulatory Visit: Payer: Self-pay | Admitting: *Deleted

## 2021-11-11 ENCOUNTER — Ambulatory Visit (INDEPENDENT_AMBULATORY_CARE_PROVIDER_SITE_OTHER): Payer: 59 | Admitting: Family

## 2021-11-11 VITALS — BP 100/60 | HR 78 | Temp 97.5°F | Resp 16

## 2021-11-11 DIAGNOSIS — J3089 Other allergic rhinitis: Secondary | ICD-10-CM | POA: Diagnosis not present

## 2021-11-11 DIAGNOSIS — K219 Gastro-esophageal reflux disease without esophagitis: Secondary | ICD-10-CM | POA: Diagnosis not present

## 2021-11-11 DIAGNOSIS — J4540 Moderate persistent asthma, uncomplicated: Secondary | ICD-10-CM | POA: Diagnosis not present

## 2021-11-11 DIAGNOSIS — R058 Other specified cough: Secondary | ICD-10-CM | POA: Diagnosis not present

## 2021-11-11 DIAGNOSIS — R059 Cough, unspecified: Secondary | ICD-10-CM | POA: Diagnosis not present

## 2021-11-11 DIAGNOSIS — H1013 Acute atopic conjunctivitis, bilateral: Secondary | ICD-10-CM

## 2021-11-11 MED ORDER — DOXYCYCLINE HYCLATE 100 MG PO TABS
100.0000 mg | ORAL_TABLET | Freq: Two times a day (BID) | ORAL | 0 refills | Status: AC
  Filled 2021-11-11: qty 14, 7d supply, fill #0

## 2021-11-11 MED ORDER — EPINEPHRINE 0.3 MG/0.3ML IJ SOAJ
0.3000 mg | INTRAMUSCULAR | 1 refills | Status: DC | PRN
  Filled 2021-11-11: qty 2, 28d supply, fill #0

## 2021-11-11 NOTE — Progress Notes (Signed)
[place] [address] Dept: [phone]  FOLLOW UP NOTE  Patient ID: Deanna Rowe, female    DOB: 1955/11/22  Age: 65 y.o. MRN: [id number] Date of Office Visit: 11/11/2021  Assessment  Chief Complaint: Asthma  HPI Deanna Rowe is a 66 year old female who presents today for follow-up of not well-controlled moderate persistent asthma, perennial allergic rhinitis, allergic conjunctivitis, and gastroesophageal reflux disease.  She was last seen on March 30, 2021 by Gareth Morgan, FNP.  Her husband is here with her today and helps provide history.  She denies any new diagnosis or surgery since her last office visit.  Moderate persistent asthma is reported as not well-controlled with Symbicort 160/4.5 mcg as needed, albuterol as needed, and Xolair 300 mg injections every 4 weeks.  She is currently not taking montelukast 10 mg once a day because it did not "do her any good".  She reports that her breathing is the same since her last office visit, but she does have a productive cough with green sputum for a week.  2 days ago she did have a temperature of 99.3 F.  She also complains of wheezing, shortness of breath, and tightness in her chest sometimes.  She also has nocturnal awakenings due to breathing problems.  She denies any body aches or known sick contacts.  Since her last office visit she has not required any systemic steroids or made any trips to the emergency room or urgent care due to breathing problems.  She uses her albuterol inhaler maybe once a week.  When asked why she does not use her Symbicort 160/4.5 mcg 2 puffs twice a day she reports that she does not take it every day because she does not feel like she needs to.  When she does use the Symbicort she only uses it 2 puffs once a day.  She reports that in the past she has called our office about the "dots" on her hands and forearms .She wonder if is due to Xolair injections.  She reports that these dots do not itch  or hurt.  They do not go away.  Discussed how these look like age spots, but to let us know if anything changes.  Allergic rhinitis: She is currently not taking any medication at this time.  She has not been taking an antihistamine, or using azelastine nasal spray, or Nasacort nasal spray.  She also has not been using Mucinex for postnasal drip as needed.  She reports clear rhinorrhea and postnasal drip.  She denies nasal congestion.  She has not had any sinus infections since we last saw her.  Her husband does mention that they just recently came back [facility] and that they all had colds.  She feels like she has a cold.  She did have a low-grade temperature of 99.3 F 2 days ago.  She denies any sick contacts or body aches.  Allergic conjunctivitis is reported as controlled.  She denies any itchy watery eyes.  Reflux: She reports reflux approximately once a week and that Pepcid does not do her any good.  She is seen her GI doctor, Dr. Daisey Must, in the past and was diagnosed with achalasia.  Her husband thinks that she has tried a proton pump inhibitor in the past.  They are going to schedule an appointment to discuss options.     Drug Allergies:  Allergies  Allergen Reactions   Fluticasone-Salmeterol Anaphylaxis   Codeine Nausea And Vomiting   Fetzima [  Levomilnacipran] Nausea And Vomiting   Nsaids Other (See Comments)    Upset stomach    Trulicity [Dulaglutide] Nausea And Vomiting    Significant and undesirably rapid (40 lbs) weight loss    Xanax [Alprazolam] Other (See Comments)    disoriented   Amoxicillin Rash    Has patient had a PCN reaction causing immediate rash, facial/tongue/throat swelling, SOB or lightheadedness with hypotension: Yes Has patient had a PCN reaction causing severe rash involving mucus membranes or skin necrosis: No Has patient had a PCN reaction that required hospitalization: No Has patient had a PCN reaction occurring within the last 10 years: No If all of the  above answers are "NO", then may proceed with Cephalosporin use.    Pseudoephedrine Hcl Er Palpitations    Review of Systems: Review of Systems  Constitutional:  Positive for fever. Negative for chills.       Reports a low-grade fever of 99.3- 2 days ago.  HENT:         Reports clear rhinorrhea and postnasal drip.  Denies nasal congestion  Eyes:        Denies itchy watery eyes  Respiratory:  Positive for cough, shortness of breath and wheezing.        Reports productive cough with green sputum, tightness in her chest sometimes, wheezing, shortness of breath, and nocturnal awakenings due to breathing problems  Cardiovascular:  Negative for chest pain and palpitations.  Gastrointestinal:        Reports reflux symptoms that occur once a week  Genitourinary:  Negative for frequency.  Neurological:  Negative for headaches.  Endo/Heme/Allergies:  Positive for environmental allergies.     Physical Exam: BP 100/60   Pulse 78   Temp (!) 97.5 F (36.4 C) (Temporal)   Resp 16   SpO2 97%    Physical Exam Exam conducted with a chaperone present.  Constitutional:      Appearance: Normal appearance.  HENT:     Head: Normocephalic and atraumatic.     Comments: Pharynx normal, eyes normal, ears normal, nose: Bilateral lower turbinates mildly edematous and slightly erythematous with no drainage noted    Right Ear: Tympanic membrane, ear canal and external ear normal.     Left Ear: Tympanic membrane, ear canal and external ear normal.     Mouth/Throat:     Mouth: Mucous membranes are moist.     Pharynx: Oropharynx is clear.  Eyes:     Conjunctiva/sclera: Conjunctivae normal.  Cardiovascular:     Rate and Rhythm: Normal rate and regular rhythm.     Heart sounds: Normal heart sounds.  Pulmonary:     Effort: Pulmonary effort is normal.     Breath sounds: Normal breath sounds.     Comments: Lungs clear to auscultation Musculoskeletal:     Cervical back: Neck supple.  Skin:     General: Skin is warm.     Comments: No rash or urticarial lesions noted  Neurological:     Mental Status: She is alert and oriented to person, place, and time.  Psychiatric:        Mood and Affect: Mood normal.        Behavior: Behavior normal.        Thought Content: Thought content normal.        Judgment: Judgment normal.     Diagnostics: FVC 1.85 L (68%), FEV1 1.47 L (69%). Predicted FVC 2.71 L, predicted FEV1 2.12 L.Marland Kitchen  Spirometry indicates possible moderate to severe restriction.  Poor technique noted  Assessment and Plan: 1. Not well controlled moderate persistent asthma   2. Productive cough   3. Perennial allergic rhinitis   4. Allergic conjunctivitis of both eyes   5. Gastroesophageal reflux disease, unspecified whether esophagitis present     Meds ordered this encounter  Medications   EPINEPHrine (EPIPEN 2-PAK) 0.3 mg/0.3 mL IJ SOAJ injection    Sig: Inject 0.3 mg into the muscle as needed for anaphylaxis.    Dispense:  2 each    Refill:  1    Patient Instructions  Asthma Start Symbicort 160/4.5 mcg to-2 puffs twice a day with a spacer to prevent cough or wheeze.  Make sure and use this every day even though you are not having symptoms.  We can set a reminder on a phone or set it next to your toothbrush as a reminder Stop montelukast 10 mg since she could not tell a difference with your symptoms while taking Continue albuterol 2 puffs every 4 hours as needed for cough, wheeze, tightness in chest, or shortness of breath Continue Xolair injections 300 mg once every 4 weeks and have access to an epinephrine auto-injector set.  Refill of her EpiPen sent.   Discussed that I do not think the rash on her hand is a rash and is age spots  Allergic rhinitis Continue Nasacort 2 sprays in each nostril once a day as needed for stuffy nose Continue saline nasal rinses as needed for nasal symptoms. Use this before any medicated nasal sprays for best result Continue azelastine  nasal spray 2 sprays in each nostril twice a day as needed for nasal symptoms May use Xyzal 5 mg once a day only as needed for a runny nose or itch Continue allergen avoidance measures directed toward dust mites, cat, and dog as listed below For thick post nasal drainage, begin Mucinex [phone] mg twice a day and increase your fluid intake in order to thin out mucus  Allergic conjunctivitis Some over the counter eye drops include Pataday one drop in each eye once a day as needed for red, itchy eyes OR Zaditor one drop in each eye twice a day as needed for red itchy eyes  Reflux Continue Pepcid as previously prescribed.  Contact your GI doctor about other options since Pepcid is not working and you have tried proton pump inhibitors in the past Continue dietary and lifestyle modifications   Please let us know if this treatment plan is not working well for you  Follow up in 6 to 8 weeks or sooner if needed.  Control of Dog or Cat Allergen Avoidance is the best way to manage a dog or cat allergy. If you have a dog or cat and are allergic to dog or cats, consider removing the dog or cat from the home. If you have a dog or cat but don't want to find it a new home, or if your family wants a pet even though someone in the household is allergic, here are some strategies that may help keep symptoms at bay:  Keep the pet out of your bedroom and restrict it to only a few rooms. Be advised that keeping the dog or cat in only one room will not limit the allergens to that room. Don't pet, hug or kiss the dog or cat; if you do, wash your hands with soap and water. High-efficiency particulate air (HEPA) cleaners run continuously in a bedroom or living room can reduce allergen levels over time. Regular use  of a high-efficiency vacuum cleaner or a central vacuum can reduce allergen levels. Giving your dog or cat a bath at least once a week can reduce airborne allergen.   Control of Dust Mite Allergen Dust  mites play a major role in allergic asthma and rhinitis. They occur in environments with high humidity wherever human skin is found. Dust mites absorb humidity from the atmosphere (ie, they do not drink) and feed on organic matter (including shed human and animal skin). Dust mites are a microscopic type of insect that you cannot see with the naked eye. High levels of dust mites have been detected from mattresses, pillows, carpets, upholstered furniture, bed covers, clothes, soft toys and any woven material. The principal allergen of the dust mite is found in its feces. A gram of dust may contain 1,000 mites and 250,000 fecal particles. Mite antigen is easily measured in the air during house cleaning activities. Dust mites do not bite and do not cause harm to humans, other than by triggering allergies/asthma.  Ways to decrease your exposure to dust mites in your home:  1. Encase mattresses, box springs and pillows with a mite-impermeable barrier or cover  2. Wash sheets, blankets and drapes weekly in hot water (130 F) with detergent and dry them in a dryer on the hot setting.  3. Have the room cleaned frequently with a vacuum cleaner and a damp dust-mop. For carpeting or rugs, vacuuming with a vacuum cleaner equipped with a high-efficiency particulate air (HEPA) filter. The dust mite allergic individual should not be in a room which is being cleaned and should wait 1 hour after cleaning before going into the room.  4. Do not sleep on upholstered furniture (eg, couches).  5. If possible removing carpeting, upholstered furniture and drapery from the home is ideal. Horizontal blinds should be eliminated in the rooms where the person spends the most time (bedroom, study, television room). Washable vinyl, roller-type shades are optimal.  6. Remove all non-washable stuffed toys from the bedroom. Wash stuffed toys weekly like sheets and blankets above.  7. Reduce indoor humidity to less than 50%.  Inexpensive humidity monitors can be purchased at most hardware stores. Do not use a humidifier as can make the problem worse and are not recommended. Return in about 6 weeks (around 12/23/2021), or if symptoms worsen or fail to improve.    Thank you for the opportunity to care for this patient.  Please do not hesitate to contact me with questions.  Althea Charon, FNP [facility]

## 2021-11-11 NOTE — Progress Notes (Signed)
Please let Deanna Rowe know that her chest x-ray is normal. We will send in a prescription for doxycycline 100 mg twice a day for 7 days to treat for a sinus infection. Quantity 14 tablets with no refills.Can she tolerate doxycycline?  Left message for her to return call to go over results.

## 2021-11-16 ENCOUNTER — Other Ambulatory Visit (HOSPITAL_COMMUNITY): Payer: Self-pay

## 2021-11-16 ENCOUNTER — Other Ambulatory Visit (HOSPITAL_BASED_OUTPATIENT_CLINIC_OR_DEPARTMENT_OTHER): Payer: Self-pay

## 2021-11-17 ENCOUNTER — Other Ambulatory Visit (HOSPITAL_BASED_OUTPATIENT_CLINIC_OR_DEPARTMENT_OTHER): Payer: Self-pay

## 2021-11-18 ENCOUNTER — Other Ambulatory Visit (HOSPITAL_BASED_OUTPATIENT_CLINIC_OR_DEPARTMENT_OTHER): Payer: Self-pay

## 2021-11-22 ENCOUNTER — Other Ambulatory Visit (HOSPITAL_BASED_OUTPATIENT_CLINIC_OR_DEPARTMENT_OTHER): Payer: Self-pay

## 2021-11-22 DIAGNOSIS — M25551 Pain in right hip: Secondary | ICD-10-CM | POA: Diagnosis not present

## 2021-11-22 MED ORDER — PREDNISONE 5 MG PO TABS
ORAL_TABLET | ORAL | 0 refills | Status: DC
  Filled 2021-11-22: qty 21, 6d supply, fill #0

## 2021-11-24 ENCOUNTER — Telehealth: Payer: Self-pay | Admitting: Family

## 2021-11-24 ENCOUNTER — Other Ambulatory Visit (HOSPITAL_COMMUNITY): Payer: Self-pay

## 2021-11-24 ENCOUNTER — Encounter (INDEPENDENT_AMBULATORY_CARE_PROVIDER_SITE_OTHER): Payer: Self-pay | Admitting: Nurse Practitioner

## 2021-11-24 ENCOUNTER — Ambulatory Visit (INDEPENDENT_AMBULATORY_CARE_PROVIDER_SITE_OTHER): Payer: 59 | Admitting: Nurse Practitioner

## 2021-11-24 VITALS — BP 117/73 | HR 63 | Temp 97.9°F | Ht 66.0 in | Wt 205.0 lb

## 2021-11-24 DIAGNOSIS — Z794 Long term (current) use of insulin: Secondary | ICD-10-CM | POA: Diagnosis not present

## 2021-11-24 DIAGNOSIS — Z6833 Body mass index (BMI) 33.0-33.9, adult: Secondary | ICD-10-CM

## 2021-11-24 DIAGNOSIS — E1122 Type 2 diabetes mellitus with diabetic chronic kidney disease: Secondary | ICD-10-CM | POA: Diagnosis not present

## 2021-11-24 DIAGNOSIS — E669 Obesity, unspecified: Secondary | ICD-10-CM

## 2021-11-24 DIAGNOSIS — Z7985 Long-term (current) use of injectable non-insulin antidiabetic drugs: Secondary | ICD-10-CM

## 2021-11-24 DIAGNOSIS — N1832 Chronic kidney disease, stage 3b: Secondary | ICD-10-CM | POA: Diagnosis not present

## 2021-11-24 NOTE — Telephone Encounter (Signed)
Called and spoke with patient. She stated that she has been coughing day and night for 2 weeks. She finished her antibiotic yesterday; and is no longer coughing/ blowing green but now it's white and thick. Is still taking her Symbicort and Albuterol but it's not helping her cough. Please advise. Thanks

## 2021-11-24 NOTE — Telephone Encounter (Signed)
Patient calling stating she continues to cough and her current medications are not helping patient deny's fever asking for an RX that will help with this cough please advise

## 2021-11-25 ENCOUNTER — Telehealth: Payer: Self-pay | Admitting: Adult Health

## 2021-11-25 ENCOUNTER — Ambulatory Visit
Admission: RE | Admit: 2021-11-25 | Discharge: 2021-11-25 | Disposition: A | Discharge status: Home / Self Care | Payer: 59 | Source: Ambulatory Visit | Attending: Internal Medicine | Admitting: Internal Medicine

## 2021-11-25 ENCOUNTER — Other Ambulatory Visit: Payer: Self-pay | Admitting: Internal Medicine

## 2021-11-25 ENCOUNTER — Other Ambulatory Visit (HOSPITAL_BASED_OUTPATIENT_CLINIC_OR_DEPARTMENT_OTHER): Payer: Self-pay

## 2021-11-25 ENCOUNTER — Other Ambulatory Visit: Payer: Self-pay

## 2021-11-25 ENCOUNTER — Other Ambulatory Visit (HOSPITAL_COMMUNITY): Payer: Self-pay

## 2021-11-25 DIAGNOSIS — E1151 Type 2 diabetes mellitus with diabetic peripheral angiopathy without gangrene: Secondary | ICD-10-CM | POA: Diagnosis not present

## 2021-11-25 DIAGNOSIS — E042 Nontoxic multinodular goiter: Secondary | ICD-10-CM | POA: Diagnosis not present

## 2021-11-25 DIAGNOSIS — N1832 Chronic kidney disease, stage 3b: Secondary | ICD-10-CM | POA: Diagnosis not present

## 2021-11-25 DIAGNOSIS — Z794 Long term (current) use of insulin: Secondary | ICD-10-CM | POA: Diagnosis not present

## 2021-11-25 DIAGNOSIS — I700 Atherosclerosis of aorta: Secondary | ICD-10-CM | POA: Diagnosis not present

## 2021-11-25 MED ORDER — DEXCOM G7 RECEIVER DEVI
1 refills | Status: DC
  Filled 2021-11-25: qty 1, 30d supply, fill #0
  Filled 2022-05-19: qty 1, 90d supply, fill #0

## 2021-11-25 MED ORDER — AZELASTINE HCL 0.1 % NA SOLN
2.0000 | Freq: Two times a day (BID) | NASAL | 5 refills | Status: AC | PRN
  Filled 2021-11-25: qty 30, 30d supply, fill #0

## 2021-11-25 MED ORDER — DEXCOM G7 SENSOR MISC
4 refills | Status: AC
  Filled 2021-11-25: qty 3, 30d supply, fill #0
  Filled 2022-05-19: qty 9, 90d supply, fill #0

## 2021-11-25 NOTE — Telephone Encounter (Signed)
She needs to schedule a follow up with GI as her reflux could be contributing to her cough.  Please have her start azelastine 1-2 spray in each nostril twice a day to see if this helps with the drainage. Caution her that azelastine can be drying. She could also do saline rinses prior to using  her nasal sprays.

## 2021-11-25 NOTE — Telephone Encounter (Signed)
I think you saw her recently and gave an antibiotic.

## 2021-11-25 NOTE — Telephone Encounter (Signed)
This is noted.

## 2021-11-25 NOTE — Telephone Encounter (Signed)
Please document for 8/31 office visit that the patient has been falling and has fallen about 5X.

## 2021-11-25 NOTE — Telephone Encounter (Signed)
Pt informed and will call gi wil sent in azelastine

## 2021-11-25 NOTE — Telephone Encounter (Signed)
Is she taking the Symbicort 160/4.5 mcg 2 puffs twice a day as recommended at the last office visit? If yes, then we need to look for other causes since albuterol does not help.  Does she feel like she is coughing due to drainage down her throat?  If yes, Is she taking her antihistamine (Xyzal) daily? Using azelastine nasal spray, and Nasacort nasal spray? Has she tried saline rinse?  Did she schedule an appointment with GI to discuss her reflux symptoms? Is she still taking Pepcid daily? If I remember correctly she has been on a proton pump inhibitor before.

## 2021-11-25 NOTE — Telephone Encounter (Signed)
She is doing Symbicort, she does have drainage at back of her throat and not 100% sure if this is contributing to her cough. No follow up with gi, and not taking pepcid.

## 2021-11-30 ENCOUNTER — Other Ambulatory Visit (HOSPITAL_BASED_OUTPATIENT_CLINIC_OR_DEPARTMENT_OTHER): Payer: Self-pay

## 2021-12-01 ENCOUNTER — Other Ambulatory Visit (HOSPITAL_BASED_OUTPATIENT_CLINIC_OR_DEPARTMENT_OTHER): Payer: Self-pay

## 2021-12-02 ENCOUNTER — Encounter: Payer: Self-pay | Admitting: Adult Health

## 2021-12-02 ENCOUNTER — Ambulatory Visit (INDEPENDENT_AMBULATORY_CARE_PROVIDER_SITE_OTHER): Payer: 59 | Admitting: Adult Health

## 2021-12-02 VITALS — BP 124/77 | HR 76 | Ht 66.0 in | Wt 211.0 lb

## 2021-12-02 DIAGNOSIS — G20 Parkinson's disease: Secondary | ICD-10-CM

## 2021-12-02 DIAGNOSIS — R296 Repeated falls: Secondary | ICD-10-CM

## 2021-12-02 NOTE — Progress Notes (Signed)
PATIENT: Deanna Rowe DOB: 05-24-1955  REASON FOR VISIT: follow up HISTORY FROM: patient PRIMARY NEUROLOGIST: Dr. Rexene Alberts Sleep Neurologist: Dr. Brett Fairy  Chief Complaint  Patient presents with   Follow-up    Rm 4, Simona Huh, husband. Has had 5 falls within the last 2 wks.  Last time tripped over cane.  No PT/ or exericse regimen,  does do walking with family.         HISTORY OF PRESENT ILLNESS: Today 12/02/21: Deanna Rowe is a 65 year old female with a history of parkinsonism and obstructive sleep apnea.  She returns today for follow-up.  She reports that in that last 2-3 weeks has had 5 falls. One episode she tripped over a stool, another time she was getting out of bed and her legs gave out. Denies tremor but husband reports that he has seen a tremor int he jaw. No trouble eating. Reports that she has trouble getting her last tablet down --thought she should be taking it 6 times. Sleeping ok. Not using CPAP- could not tolerate it reports that it made her cough.   06/07/21 Deanna Rowe is a 66 year old female with a history of parkinsonism and obstructive sleep apnea on CPAP.  She returns today for follow-up.  She is here today with her husband.  She is currently on Sinemet CR 50-200 milligrams 5 times a day and Sinemet IR 25-250 mg twice a day.  She reports that this combination is working well for her.  Denies any significant tremor.  Reports sometimes her lips will quiver.  She reports over the last year she has had 3 falls.  Denies any significant changes in her gait and balance.  She uses a cane at all times.  She does have achalasia and reports some trouble swallowing liquids and solids.  In regards to sleep apnea she reports that she has not been able to use her machine consistently due to an ongoing cough.  She is currently following up with her allergy specialist regarding this.  HISTORY (copied from Dr. Guadelupe Sabin note) Deanna Rowe is a 66 year old right-handed woman with an  underlying complex medical history of asthma, arthritis, anemia, avascular necrosis of the hip, depression, anxiety, type 2 diabetes, reflux disease, hyperlipidemia, hypertension, kidney disease, restless leg syndrome, history of TIA, thyroid disease, vitamin D deficiency, and obesity, who was diagnosed with parkinsonism several years ago.  She was noted to have right-sided signs.  She was on Abilify in the past.  She is no longer on Abilify for the past 2 years, but she is on multiple psychotropic medications including lorazepam, bupropion, Rexulti, Trintellix, and also takes gabapentin as well as zolpidem.  She has clearly stopped the zolpidem about 3 weeks ago and is currently on melatonin 5 mg each night which has been helpful.  She is on ropinirole for restless leg syndrome per primary care, currently on 2 mg twice daily.  For parkinsonism she is on Sinemet CR, 50-200 mg strength 1 pill 5 times a day.  She last saw Dr. Jannifer Franklin on 08/26/2020, at which time she was advised to add Sinemet IR 25-250 mg strength, 1 pill twice daily, in the morning and at 2 PM daily.     She has a history of urinary incontinence and is currently on Myrbetriq and Vesicare per urology.  She has chronic constipation for at least 1 year and has been on MiraLAX daily, and 2 stool softeners daily. She has been using a cane.  Since the last appointment, she has  not fallen thankfully.  She believes that the addition of Sinemet IR has helped but she only takes 1 in the morning.   She had a brain MRI without contrast on 03/15/2017 and I reviewed the results:   IMPRESSION: Slightly abnormal MRI scan of the brain showing prominent changes of chronic microvascular ischemia.  No acute abnormalities noted.  Overall no significant change compared with prior MRI scan dated 05/23/2014.   For her sleep apnea, she is followed by Dr. Brett Fairy and the nurse practitioner.  She reports 50% compliance with her CPAP.  Sometimes acid reflux bothers her at  night which bothers her CPAP usage.    REVIEW OF SYSTEMS: Out of a complete 14 system review of symptoms, the patient complains only of the following symptoms, and all other reviewed systems are negative.  ALLERGIES: Allergies  Allergen Reactions   Fluticasone-Salmeterol Anaphylaxis   Codeine Nausea And Vomiting   Fetzima [Levomilnacipran] Nausea And Vomiting   Nsaids Other (See Comments)    Upset stomach    Trulicity [Dulaglutide] Nausea And Vomiting    Significant and undesirably rapid (40 lbs) weight loss    Xanax [Alprazolam] Other (See Comments)    disoriented   Amoxicillin Rash    Has patient had a PCN reaction causing immediate rash, facial/tongue/throat swelling, SOB or lightheadedness with hypotension: Yes Has patient had a PCN reaction causing severe rash involving mucus membranes or skin necrosis: No Has patient had a PCN reaction that required hospitalization: No Has patient had a PCN reaction occurring within the last 10 years: No If all of the above answers are "NO", then may proceed with Cephalosporin use.    Pseudoephedrine Hcl Er Palpitations    HOME MEDICATIONS: Outpatient Medications Prior to Visit  Medication Sig Dispense Refill   ACCU-CHEK FASTCLIX LANCETS MISC USE TO CHECK BLOOD SUGAR 3 TIMES A DAY 102 each 0   ACCU-CHEK GUIDE test strip USE TO CHECK BLOOD SUGAR 3 TIMES PER DAY. (Patient taking differently: Use to check blood sugar 4 times per day.) 100 each 2   albuterol (VENTOLIN HFA) 108 (90 Base) MCG/ACT inhaler Inhale 2 puffs into the lungs as needed for wheezing or shortness of breath. 18 g 2   amLODipine (NORVASC) 5 MG tablet Take 1 tablet by mouth once a day 90 tablet 0   aspirin EC 81 MG tablet Take 81 mg by mouth every evening.     atorvastatin (LIPITOR) 10 MG tablet TAKE 1 TABLET BY MOUTH AT BEDTIME 90 tablet 3   azelastine (ASTELIN) 0.1 % nasal spray Place 2 sprays into both nostrils 2 (two) times daily as needed. 30 mL 5   brexpiprazole  (REXULTI) 2 MG TABS tablet Take 1 tablet (2 mg total) by mouth in the morning. 90 tablet 1   buPROPion (WELLBUTRIN XL) 150 MG 24 hr tablet Take 3 tablets (450 mg total) by mouth in the morning. 270 tablet 1   Calcium Carbonate-Vitamin D 600-400 MG-UNIT tablet Take 1 tablet by mouth daily.     carbidopa-levodopa (SINEMET CR) 50-200 MG tablet TAKE 1 TABLET BY MOUTH 5 (FIVE) TIMES DAILY. 450 tablet 1   carbidopa-levodopa (SINEMET) 25-250 MG tablet Take 1 tablet by mouth in the morning and 1 tablet at 2pm 180 tablet 1   Continuous Blood Gluc Receiver (DEXCOM G7 RECEIVER) DEVI Use as directed to check blood sugar 1 each 1   Continuous Blood Gluc Sensor (DEXCOM G7 SENSOR) MISC Use to check blood sugar and change sensor every 10 days  9 each 4   COVID-19 At Home Antigen Test (CARESTART COVID-19 HOME TEST) KIT Use as directed 4 each 0   COVID-19 mRNA bivalent vaccine, Pfizer, (PFIZER COVID-19 VAC BIVALENT) injection Inject into the muscle. 0.3 mL 0   EPINEPHrine (EPIPEN 2-PAK) 0.3 mg/0.3 mL IJ SOAJ injection Inject 0.3 mg into the muscle as needed for anaphylaxis. 2 each 1   gabapentin (NEURONTIN) 300 MG capsule TAKE 1 CAPSULE BY MOUTH TWICE DAILY 180 capsule 0   glucose blood test strip Use to check blood sugar 4 times daily 400 each 3   influenza vaccine adjuvanted (FLUAD) 0.5 ML injection Inject into the muscle. 0.5 mL 0   Insulin Glargine (BASAGLAR KWIKPEN) 100 UNIT/ML INJECT 44 UNITS UNDER THE SKIN ONCE DAILY **MAX OF 50 UNITS DAILY** 45 mL 3   insulin lispro (HUMALOG) 100 UNIT/ML injection SQ - 14 units at breakfast, 12 units at lunch, 16 units at supper 10 mL 0   lansoprazole (PREVACID) 15 MG capsule Take 1 capsule (15 mg total) by mouth daily at 12 noon. 30 capsule 0   levocetirizine (XYZAL) 5 MG tablet Take 1 tablet (5 mg total) by mouth daily as needed. 30 tablet 5   LORazepam (ATIVAN) 2 MG tablet Take 1 tablet (2 mg total) by mouth 3 (three) times daily. 270 tablet 1   losartan (COZAAR) 100 MG  tablet Take 1 tablet by mouth once a day 90 tablet 4   melatonin 5 MG TABS Take 5 mg by mouth at bedtime.     mirabegron ER (MYRBETRIQ) 50 MG TB24 tablet Take 1 tablet by mouth once daily 30 tablet 6   mirabegron ER (MYRBETRIQ) 50 MG TB24 tablet Take 1 tablet by mouth everyday 90 tablet 3   Olopatadine HCl (PATADAY) 0.2 % SOLN Place 1 drop into both eyes daily as needed. 2.5 mL 5   omalizumab (XOLAIR) 150 MG/ML prefilled syringe Inject 300 mg into the skin every 28 days. 2 mL 11   predniSONE (DELTASONE) 5 MG tablet Take 6 tablets by mouth for 1 day, then 5 tablets by mouth for 1 day, 4 tablets for 1 day, 3 tablets for 1 day, 2 tablets for 1 day, then 1 tablet for 1 day 21 tablet 0   propranolol (INDERAL) 40 MG tablet TAKE 1 TABLET BY MOUTH TWICE DAILY 60 tablet 11   rOPINIRole (REQUIP) 2 MG tablet Take 1 tablet by mouth 2 times daily 60 tablet 0   rOPINIRole (REQUIP) 2 MG tablet Take 1 tablet (2 mg total) by mouth 2 (two) times daily. 90 tablet 1   Semaglutide, 2 MG/DOSE, (OZEMPIC, 2 MG/DOSE,) 8 MG/3ML SOPN Inject 2 mg under the skin once a week (Patient not taking: Reported on 11/11/2021) 9 mL 3   Semaglutide, 2 MG/DOSE, (OZEMPIC, 2 MG/DOSE,) 8 MG/3ML SOPN Inject 2 mg into the skin once a week. (Patient not taking: Reported on 10/12/2021) 9 mL 3   Semaglutide-Weight Management (WEGOVY) 2.4 MG/0.75ML SOAJ Inject 2.4 mg under the skin Once a week 9 mL 4   solifenacin (VESICARE) 10 MG tablet TAKE 1 TABLET BY MOUTH ONCE DAILY 30 tablet 11   triamcinolone (NASACORT) 55 MCG/ACT AERO nasal inhaler Place 2 sprays into the nose daily. 16.9 mL 5   valACYclovir (VALTREX) 500 MG tablet Take 1 tablet by mouth everyday 90 tablet 4   vitamin C (ASCORBIC ACID) 500 MG tablet Take 500 mg by mouth daily.     Vitamin D, Ergocalciferol, 2000 units CAPS Take 1  capsule by mouth daily. 2000 units daily.     vortioxetine HBr (TRINTELLIX) 20 MG TABS tablet Take 1 tablet (20 mg total) by mouth every morning. 90 tablet 1    zolpidem (AMBIEN) 5 MG tablet Take 1 tablet (5 mg total) by mouth at bedtime. 90 tablet 1   Facility-Administered Medications Prior to Visit  Medication Dose Route Frequency Provider Last Rate Last Admin   tranexamic acid (CYKLOKAPRON) topical -INTRAOP  2,000 mg Topical Once [place], [employer], PA-C        PAST MEDICAL HISTORY: Past Medical History:  Diagnosis Date   Anemia    Anxiety    Arthritis    L HIP   Asthma    Avascular necrosis of hip ([place])    LEFT   Back pain    Chest pain    Constipation    Depression    Diabetes mellitus    Diabetes mellitus, type II (HCC)    Dry cough    Edema, lower extremity    Gait abnormality 11/18/2019   GERD (gastroesophageal reflux disease)    High cholesterol    Hypertension    Insomnia    takes Ambien nightly   Joint pain    Kidney disease    Kidney disease    Obesity    Parkinsonism ([place]) 03/02/2017   PONV (postoperative nausea and vomiting)    RLS (restless legs syndrome) 01/15/2018   Sleep apnea    Swallowing difficulty    Thyroid disease    TIA (transient ischemic attack) 2012   no residual problems   Urgency incontinence    Vitamin D deficiency     PAST SURGICAL HISTORY: Past Surgical History:  Procedure Laterality Date   DILATION AND CURETTAGE OF UTERUS     ESOPHAGEAL MANOMETRY N/A 09/03/2012   Procedure: ESOPHAGEAL MANOMETRY (EM);  Surgeon: Garlan Fair, MD;  Location: [facility];  Service: Endoscopy;  Laterality: N/A;   ESOPHAGEAL MANOMETRY N/A 06/19/2017   Procedure: ESOPHAGEAL MANOMETRY (EM);  Surgeon: Clarene Essex, MD;  Location: [facility];  Service: Endoscopy;  Laterality: N/A;   EXPLORATORY LAPAROTOMY     GANGLION CYST EXCISION     JOINT REPLACEMENT  2011   rt total hip   KNEE ARTHROSCOPY     PILONIDAL CYST / SINUS EXCISION     TOTAL HIP ARTHROPLASTY Left 10/09/2013   Procedure: LEFT TOTAL HIP ARTHROPLASTY ANTERIOR APPROACH;  Surgeon: [name], MD;  Location: [facility];  Service: Orthopedics;   Laterality: Left;    FAMILY HISTORY: Family History  Problem Relation Age of Onset   High blood pressure Mother    Hyperlipidemia Mother    Heart disease Mother    Heart disease Father    Alcoholism Father    Allergic rhinitis Sister    Alcohol abuse Brother    Alcohol abuse Brother    Alcohol abuse Brother    Alcohol abuse Brother    Diabetes Neg Hx    Angioedema Neg Hx    Asthma Neg Hx    Eczema Neg Hx    Immunodeficiency Neg Hx    Urticaria Neg Hx    Breast cancer Neg Hx    Parkinson's disease Neg Hx    Sleep apnea Neg Hx     SOCIAL HISTORY: Social History   Socioeconomic History   Marital status: Married    Spouse name: Simona Huh   Number of children: Not on file   Years of education: Not on file   Highest education  level: Not on file  Occupational History   Occupation: retired Therapist, sports  Tobacco Use   Smoking status: Never   Smokeless tobacco: Never  Vaping Use   Vaping Use: Never used  Substance and Sexual Activity   Alcohol use: No    Alcohol/week: 0.0 standard drinks of alcohol   Drug use: No   Sexual activity: Not Currently  Other Topics Concern   Not on file  Social History Narrative   Lives with husband   Caffeine use: none   Right handed    Social Determinants of Health   Financial Resource Strain: Not on file  Food Insecurity: Not on file  Transportation Needs: Not on file  Physical Activity: Not on file  Stress: Not on file  Social Connections: Not on file  Intimate Partner Violence: Not on file      PHYSICAL EXAM  Vitals:   12/02/21 1058  BP: 124/77  Pulse: 76  Weight: 211 lb (95.7 kg)  Height: 5' 6"  (1.676 m)    Body mass index is 34.06 kg/m.  Generalized: Well developed, in no acute distress   Neurological examination  Mentation: Alert oriented to time, place, history taking. Follows all commands speech and language fluent Cranial nerve II-XII: Pupils were equal round reactive to light. Extraocular movements were full, visual  field were full on confrontational test. Facial sensation and strength were normal. Head turning and shoulder shrug  were normal and symmetric. Motor: The motor testing reveals 5 over 5 strength of all 4 extremities. Good symmetric motor tone is noted throughout.  Finger taps moderately impaired toe taps moderately to severely impaired.  Tremor noted in the jaw Sensory: Sensory testing is intact to soft touch on all 4 extremities. No evidence of extinction is noted.  Coordination: Cerebellar testing reveals good finger-nose-finger and heel-to-shin bilaterally.  Gait and station: Gait is slow and cautious.  Uses a cane when ambulating.  3-4 steps with turns   DIAGNOSTIC DATA (LABS, IMAGING, TESTING) - I reviewed patient records, labs, notes, testing and imaging myself where available.  Lab Results  Component Value Date   WBC 6.8 04/16/2019   HGB 12.3 04/16/2019   HCT 36.1 04/16/2019   MCV 96 04/16/2019   PLT 274 04/16/2019      Component Value Date/Time   NA 136 03/02/2020 0920   NA 142 01/12/2017 0914   K 4.7 03/02/2020 0920   K 4.2 01/12/2017 0914   CL 98 03/02/2020 0920   CO2 27 03/02/2020 0920   CO2 28 01/12/2017 0914   GLUCOSE 139 (H) 03/02/2020 0920   GLUCOSE 166 (H) 10/12/2018 1121   GLUCOSE 142 (H) 01/12/2017 0914   BUN 13 03/02/2020 0920   BUN 12.0 01/12/2017 0914   CREATININE 1.43 (H) 03/02/2020 0920   CREATININE 1.58 (H) 10/12/2018 1121   CREATININE 1.3 (H) 01/12/2017 0914   CALCIUM 9.4 03/02/2020 0920   CALCIUM 9.6 01/12/2017 0914   PROT 7.0 04/16/2019 1451   PROT 7.2 01/12/2017 0914   ALBUMIN 4.2 04/16/2019 1451   ALBUMIN 3.5 01/12/2017 0914   AST 23 04/16/2019 1451   AST 16 10/12/2018 1121   AST 19 01/12/2017 0914   ALT 21 04/16/2019 1451   ALT <6 10/12/2018 1121   ALT 18 01/12/2017 0914   ALKPHOS 58 04/16/2019 1451   ALKPHOS 43 01/12/2017 0914   BILITOT 0.3 04/16/2019 1451   BILITOT 0.2 (L) 10/12/2018 1121   BILITOT 0.28 01/12/2017 0914   GFRNONAA 38  (L) 03/02/2020 0920  GFRNONAA 34 (L) 10/12/2018 1121   GFRAA 44 (L) 03/02/2020 0920   GFRAA 40 (L) 10/12/2018 1121   Lab Results  Component Value Date   CHOL 155 04/16/2019   HDL 84 04/16/2019   LDLCALC 57 04/16/2019   TRIG 71 04/16/2019   Lab Results  Component Value Date   HGBA1C 7.9 (H) 04/16/2019   Lab Results  Component Value Date   VITAMINB12 305 04/16/2019   Lab Results  Component Value Date   TSH 1.780 04/16/2019      ASSESSMENT AND PLAN 66 y.o. year old female  has a past medical history of Anemia, Anxiety, Arthritis, Asthma, Avascular necrosis of hip ([place]), Back pain, Chest pain, Constipation, Depression, Diabetes mellitus, Diabetes mellitus, type II ([place]), Dry cough, Edema, lower extremity, Gait abnormality (11/18/2019), GERD (gastroesophageal reflux disease), High cholesterol, Hypertension, Insomnia, Joint pain, Kidney disease, Kidney disease, Obesity, Parkinsonism ([place]) (03/02/2017), PONV (postoperative nausea and vomiting), RLS (restless legs syndrome) (01/15/2018), Sleep apnea, Swallowing difficulty, Thyroid disease, TIA (transient ischemic attack) (2012), Urgency incontinence, and Vitamin D deficiency. here with:   1.  Parkinsonism 2.  Falls  Continue Sinemet CR 50-200 mg 5 times a day Continue Sinemet IR 25-250 mg twice a day Continue using cane at all times.   Referral placed for physical therapy for abnormality of gait and balance and frequent falls  Advised if symptoms worsen or she develops new symptoms she should let us know follow-up in 6 months or sooner if needed     Ward Givens, MSN, NP-C 12/02/2021, 10:57 AM [facility] [address] [place], [place] [address] [phone]

## 2021-12-02 NOTE — Progress Notes (Signed)
Chief Complaint:   OBESITY Deanna Rowe is here to discuss her progress with her obesity treatment plan along with follow-up of her obesity related diagnoses. [name] is on the Category 3 Plan and states she is following her eating plan approximately 50% of the time. [name] states she is not exercising.  Today's visit was #: 53 Starting weight: 237 lbs Starting date: 04/16/2019 Today's weight: 205 lbs Today's date: 11/24/2021 Total lbs lost to date: 32 lbs Total lbs lost since last in-office visit: 8 lbs  Interim History: Has overall done well with weight loss.  Taking Wegovy 2.4, prescribed by Dr Buddy Duty.  Denies side effects.  Reports I do not eat like I used to.  I feel full and not waking up at night to eat.  Following Category 2 meal plan and is eating one snack per day:  Peanut butter crackers.  Drinking water daily. Saw ortho yesterday.  Has been falling more.  Discussed physical therapy and she refused at this time.  Is seeing neuro in one week.   Subjective:   1. Type 2 diabetes mellitus with stage 3b chronic kidney disease, with long-term current use of insulin ([place]) Seeing Dr Buddy Duty on a regular basis.  Taking Wegovy 2.4 mg, Humalog 10 units in the morning, not taking at lunch or dinner, also has a sliding scale.  FBS: 120's-130's.  One episode of blood sugar 50. Felt it was low due to "not eating much supper at night".  Has not had to use sliding scale.  BS post prandial 98-100.  On ARB and statins.  Last eye exam:  scheduled in two weeks.   Assessment/Plan:   1. Type 2 diabetes mellitus with stage 3b chronic kidney disease, with long-term current use of insulin (HCC) Continue to follow up with Dr Buddy Duty.  Has appointment scheduled tomorrow.   Good blood sugar control is important to decrease the likelihood of diabetic complications such as nephropathy, neuropathy, limb loss, blindness, coronary artery disease, and death. Intensive lifestyle modification including diet, exercise and  weight loss are the first line of treatment for diabetes.    2. Obesity: current  BMI 33.2 Nhi is currently in the action stage of change. As such, her goal is to continue with weight loss efforts. She has agreed to the Category 3 Plan.   Exercise goals: All adults should avoid inactivity. Some physical activity is better than none, and adults who participate in any amount of physical activity gain some health benefits.  Behavioral modification strategies: increasing lean protein intake, increasing water intake, and no skipping meals.  [name] has agreed to follow-up with our clinic in 4 weeks. She was informed of the importance of frequent follow-up visits to maximize her success with intensive lifestyle modifications for her multiple health conditions.   Objective:   Blood pressure 117/73, pulse 63, temperature 97.9 F (36.6 C), height '5\' 6"'$  (1.676 m), weight 205 lb (93 kg), SpO2 98 %. Body mass index is 33.09 kg/m.  General: Cooperative, alert, well developed, in no acute distress. HEENT: Conjunctivae and lids unremarkable. Cardiovascular: Regular rhythm.  Lungs: Normal work of breathing. Neurologic: No focal deficits.   Lab Results  Component Value Date   CREATININE 1.43 (H) 03/02/2020   BUN 13 03/02/2020   NA 136 03/02/2020   K 4.7 03/02/2020   CL 98 03/02/2020   CO2 27 03/02/2020   Lab Results  Component Value Date   ALT 21 04/16/2019   AST 23 04/16/2019   ALKPHOS  58 04/16/2019   BILITOT 0.3 04/16/2019   Lab Results  Component Value Date   HGBA1C 7.9 (H) 04/16/2019   HGBA1C 6.9 11/25/2016   HGBA1C 6.0 06/10/2016   HGBA1C 5.9 02/12/2016   HGBA1C 6.1 12/01/2015   Lab Results  Component Value Date   INSULIN 12.3 04/16/2019   Lab Results  Component Value Date   TSH 1.780 04/16/2019   Lab Results  Component Value Date   CHOL 155 04/16/2019   HDL 84 04/16/2019   LDLCALC 57 04/16/2019   TRIG 71 04/16/2019   Lab Results  Component Value Date   VD25OH  44.8 12/17/2019   VD25OH 40.9 04/16/2019   Lab Results  Component Value Date   WBC 6.8 04/16/2019   HGB 12.3 04/16/2019   HCT 36.1 04/16/2019   MCV 96 04/16/2019   PLT 274 04/16/2019   No results found for: "IRON", "TIBC", "FERRITIN"  Attestation Statements:   Reviewed by clinician on day of visit: allergies, medications, problem list, medical history, surgical history, family history, social history, and previous encounter notes.  Time spent on visit including pre-visit chart review and post-visit care and charting was 30 minutes.   I, Davy Pique, RMA, am acting as transcriptionist for Everardo Pacific, FNP  I have reviewed the above documentation for accuracy and completeness, and I agree with the above. Everardo Pacific, FNP

## 2021-12-08 ENCOUNTER — Ambulatory Visit: Payer: 59 | Admitting: Adult Health

## 2021-12-09 DIAGNOSIS — Z794 Long term (current) use of insulin: Secondary | ICD-10-CM | POA: Diagnosis not present

## 2021-12-09 DIAGNOSIS — E1165 Type 2 diabetes mellitus with hyperglycemia: Secondary | ICD-10-CM | POA: Diagnosis not present

## 2021-12-14 ENCOUNTER — Other Ambulatory Visit (HOSPITAL_BASED_OUTPATIENT_CLINIC_OR_DEPARTMENT_OTHER): Payer: Self-pay

## 2021-12-14 MED ORDER — TECHLITE PEN NEEDLES 32G X 6 MM MISC
Freq: Four times a day (QID) | 0 refills | Status: DC
  Filled 2021-12-14: qty 400, 90d supply, fill #0

## 2021-12-15 ENCOUNTER — Encounter: Payer: Self-pay | Admitting: Physical Therapy

## 2021-12-15 ENCOUNTER — Telehealth: Payer: Self-pay | Admitting: Physical Therapy

## 2021-12-15 ENCOUNTER — Ambulatory Visit: Payer: 59 | Attending: Adult Health | Admitting: Physical Therapy

## 2021-12-15 ENCOUNTER — Other Ambulatory Visit (HOSPITAL_BASED_OUTPATIENT_CLINIC_OR_DEPARTMENT_OTHER): Payer: Self-pay

## 2021-12-15 DIAGNOSIS — R41844 Frontal lobe and executive function deficit: Secondary | ICD-10-CM | POA: Insufficient documentation

## 2021-12-15 DIAGNOSIS — R2681 Unsteadiness on feet: Secondary | ICD-10-CM | POA: Insufficient documentation

## 2021-12-15 DIAGNOSIS — R2689 Other abnormalities of gait and mobility: Secondary | ICD-10-CM | POA: Diagnosis not present

## 2021-12-15 DIAGNOSIS — M6281 Muscle weakness (generalized): Secondary | ICD-10-CM | POA: Insufficient documentation

## 2021-12-15 DIAGNOSIS — G20 Parkinson's disease: Secondary | ICD-10-CM | POA: Insufficient documentation

## 2021-12-15 DIAGNOSIS — R278 Other lack of coordination: Secondary | ICD-10-CM | POA: Insufficient documentation

## 2021-12-15 DIAGNOSIS — R41840 Attention and concentration deficit: Secondary | ICD-10-CM | POA: Diagnosis not present

## 2021-12-15 DIAGNOSIS — R29818 Other symptoms and signs involving the nervous system: Secondary | ICD-10-CM | POA: Diagnosis not present

## 2021-12-15 DIAGNOSIS — R296 Repeated falls: Secondary | ICD-10-CM | POA: Insufficient documentation

## 2021-12-15 NOTE — Telephone Encounter (Signed)
Deanna Rowe,   Deanna Rowe was evaluated by PT on 12/15/21.  The patient would benefit from an OT evaluation for PD related deficits - difficulties with IADLS, bradykinesia, impaired coordination.    If you agree, please place an order in [facility] workque in [facility] or fax the order to [phone]. Thank you, [name], PT, DPT 12/15/21 2:58 [place] [address] [place], [place]  [address] Phone:  [phone] Fax:  [phone]

## 2021-12-15 NOTE — Telephone Encounter (Signed)
Order placed

## 2021-12-15 NOTE — Therapy (Signed)
OUTPATIENT PHYSICAL THERAPY NEURO EVALUATION   Patient Name: Deanna Rowe MRN: [id number] DOB:Nov 25, 1955, 66 y.o., female Today's Date: 12/15/2021   PCP: Aretta Nip, MD REFERRING PROVIDER: Ward Givens, NP    PT End of Session - 12/15/21 1334     Visit Number 1    Number of Visits 17    Date for PT Re-Evaluation 03/15/22   due to delay in scheduling/pt going on vacation   Authorization Type [facility]    PT Start Time 1238   pt late to eval   PT Stop Time 1318    PT Time Calculation (min) 40 min    Equipment Utilized During Treatment Gait belt    Activity Tolerance Patient tolerated treatment well    Behavior During Therapy WFL for tasks assessed/performed             Past Medical History:  Diagnosis Date   Anemia    Anxiety    Arthritis    L HIP   Asthma    Avascular necrosis of hip ([place])    LEFT   Back pain    Chest pain    Constipation    Depression    Diabetes mellitus    Diabetes mellitus, type II (HCC)    Dry cough    Edema, lower extremity    Gait abnormality 11/18/2019   GERD (gastroesophageal reflux disease)    High cholesterol    Hypertension    Insomnia    takes Ambien nightly   Joint pain    Kidney disease    Kidney disease    Obesity    Parkinsonism ([place]) 03/02/2017   PONV (postoperative nausea and vomiting)    RLS (restless legs syndrome) 01/15/2018   Sleep apnea    Swallowing difficulty    Thyroid disease    TIA (transient ischemic attack) 2012   no residual problems   Urgency incontinence    Vitamin D deficiency    Past Surgical History:  Procedure Laterality Date   DILATION AND CURETTAGE OF UTERUS     ESOPHAGEAL MANOMETRY N/A 09/03/2012   Procedure: ESOPHAGEAL MANOMETRY (EM);  Surgeon: Garlan Fair, MD;  Location: [facility];  Service: Endoscopy;  Laterality: N/A;   ESOPHAGEAL MANOMETRY N/A 06/19/2017   Procedure: ESOPHAGEAL MANOMETRY (EM);  Surgeon: Clarene [place], MD;  Location: [facility];  Service:  Endoscopy;  Laterality: N/A;   EXPLORATORY LAPAROTOMY     GANGLION CYST EXCISION     JOINT REPLACEMENT  2011   rt total hip   KNEE ARTHROSCOPY     PILONIDAL CYST / SINUS EXCISION     TOTAL HIP ARTHROPLASTY Left 10/09/2013   Procedure: LEFT TOTAL HIP ARTHROPLASTY ANTERIOR APPROACH;  Surgeon: [name], MD;  Location: [facility];  Service: Orthopedics;  Laterality: Left;   Patient Active Problem List   Diagnosis Date Noted   Allergic conjunctivitis of both eyes 07/06/2020   Gait abnormality 11/18/2019   OSA on CPAP 06/04/2019   Pain in joint of left shoulder 03/28/2019   Seasonal allergic conjunctivitis 02/07/2019   Chronic intermittent hypoxia with obstructive sleep apnea 01/25/2019   Severe obstructive sleep apnea 01/25/2019   Excessive daytime sleepiness 01/03/2019   Non-restorative sleep 01/03/2019   Nocturia more than twice per night 01/03/2019   Weight gain, abnormal 01/03/2019   Snorings 01/03/2019   Trochanteric bursitis of right hip 04/27/2018   Encounter for routine screening for malformation using ultrasonics 02/08/2018   Abnormal weight gain 02/08/2018  Acid indigestion 02/08/2018   Acute antritis 02/08/2018   Class 2 severe obesity with serious comorbidity and body mass index (BMI) of 39.0 to 39.9 in adult ([place]) 02/08/2018   Perennial allergic rhinitis 02/08/2018   Antiphospholipid syndrome ([place]) 02/08/2018   Arthralgia of hip or thigh 02/08/2018   Asthma, cough variant 02/08/2018   Vitamin D deficiency 02/08/2018   Backhand tennis elbow 02/08/2018   Cannot sleep 02/08/2018   Chronic kidney disease (CKD), stage III (moderate) ([place]) 02/08/2018   Current drug use 02/08/2018   Depression, major, single episode, in partial remission ([place]) 02/08/2018   Diabetes mellitus with polyneuropathy ([place]) 02/08/2018   Difficulty hearing 02/08/2018   Disturbance of skin sensation 02/08/2018   Hypertension associated with diabetes ([place]) 02/08/2018   Extremity pain 02/08/2018    Facet syndrome, lumbar 02/08/2018   Factor V Leiden ([place]) 02/08/2018   Fungal infection of nail 02/08/2018   Gastroenteritis and colitis, viral 02/08/2018   Hypoglycemia 02/08/2018   Menopause 02/08/2018   Other long term (current) drug therapy 02/08/2018   Other osteonecrosis, unspecified femur ([place]) 02/08/2018   Pure hypercholesterolemia 02/08/2018   Recurrent major depressive episodes ([place]) 02/08/2018   Syncope and collapse 02/08/2018   Not well controlled moderate persistent asthma 02/08/2018   Cough, persistent 02/08/2018   Restless leg syndrome 01/15/2018   Degeneration of lumbar intervertebral disc 11/21/2017   Tremor of right hand 03/02/2017   Parkinson disease ([place]) 03/02/2017   UTI (urinary tract infection) 12/01/2015   Type 2 diabetes mellitus with stage 3b chronic kidney disease, with long-term current use of insulin ([place]) 06/03/2015   Fast heart beat 04/09/2015   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 2 diabetes mellitus ([place]) 04/24/2014   Postoperative anemia due to acute blood loss 10/10/2013   Avascular necrosis of bone of left hip ([place]) 10/09/2013   Arthritis of pelvic region, degenerative 10/09/2013   Breath shortness 06/18/2013   Diabetes mellitus type 2, uncontrolled 05/31/2013   Intermittent claudication ([place]) 04/04/2013   Anxiety    Gastroesophageal reflux disease 09/12/2012   Clinical depression 02/21/2012   Stroke ([place]) 02/11/2012   History of revision of total replacement of right hip joint 04/04/2009    ONSET DATE: 12/02/2021   REFERRING DIAG: G20 (ICD-10-CM) - Parkinson disease ([place]) R29.6 (ICD-10-CM) - Falls frequently   THERAPY DIAG:  Unsteadiness on feet  Muscle weakness (generalized)  Other abnormalities of gait and mobility  Other symptoms and signs involving the nervous system  Rationale for Evaluation and Treatment Rehabilitation  SUBJECTIVE:  SUBJECTIVE STATEMENT: Has never had therapy for Parkinsonism before. Has had some recent falls. One time fell out of the bed when she was standing up. Has been losing her balance, some will just happen when walking. Using her cane all the time for about a year. Standing a long time, turning, and doing the stairs are challenging. In the past 2 weeks has had about 5 falls. All the times she fell, pt did not had her cane. Has not injured herself with any falls. Has a seated exercise bike at home, but can't reach the pedals. Was walking for exercises, but now not doing anything for exercise. Can't put up her hair on her own.  Sometimes when standing still, pt will feel like she is going to fall backwards. Pt reports going to [place] in October and wants to be able to keep up with family.   Pt accompanied by: self  PERTINENT HISTORY: Parkinsonism (2018),TIA, thyroid disease, kidney disease, HTN, HLD, GERD, DMII, depression, L hip avascular necrosis, anxiety, anemia, R THA 2011, L THA 2015, knee arthroscopy   PAIN:  Are you having pain? No  PRECAUTIONS: Fall, No driving.   FALLS: Has patient fallen in last 6 months? Yes. Number of falls 10  LIVING ENVIRONMENT: Lives with: lives with their spouse Lives in: House/apartment Stairs: Yes: Internal: 12 steps; on right going up and External: 4 steps; on right going up Has following equipment at home: Single point cane and Walker - 2 wheeled, Rollator  PLOF: Independent with community mobility with device  PATIENT GOALS Wants to stop falling, improve balance.   OBJECTIVE:   COGNITION: Overall cognitive status: Impaired   SENSATION: WFL  COORDINATION: Heel to shin: Harder to perform with RLE.     MUSCLE TONE: RLE: Rigidity    POSTURE: rounded shoulders and forward head  LOWER EXTREMITY MMT:    MMT Right Eval Left Eval  Hip flexion 4-/5 5/5  Hip  extension    Hip abduction 4+/5 5/5  Hip adduction 5/5 5/5  Hip internal rotation    Hip external rotation    Knee flexion 4+/5 5/5  Knee extension 4-/5 4+/5  Ankle dorsiflexion    Ankle plantarflexion    Ankle inversion    Ankle eversion    (Blank rows = not tested)  Hip ADD/ABD tested in sitting.   BED MOBILITY:  Pt reports no difficulty getting in and out of the bed.   TRANSFERS: Assistive device utilized: None  Sit to stand: SBA and CGA Stand to sit: SBA With first attempt, pt taking incr time to stand.   GAIT: Gait pattern: decreased arm swing- Left, decreased stride length, Right foot flat, Left foot flat, decreased trunk rotation, and trunk flexed Distance walked: Clinic distances. Assistive device utilized: Single point cane Level of assistance: SBA Comments: Pt cautious/slow when turning.   FUNCTIONAL TESTs:  5 times sit to stand: 28.59 seconds with no UE support, one episode of BLE bracing against chair  10 meter walk test: 16.58 seconds = 1.95 ft/sec with [facility]     [facility] PT Assessment - 12/15/21 1303       Standardized Balance Assessment   Standardized Balance Assessment Mini-BESTest;Timed Up and Go Test      Mini-BESTest   Sit To Stand Normal: Comes to stand without use of hands and stabilizes independently.    Rise to Toes < 3 s.   1.5 seconds   Stand on one leg (left) Moderate: < 20 s   2 seconds  Stand on one leg (right) Moderate: < 20 s   1.2 seconds, 5 seconds   Stand on one leg - lowest score 1    Compensatory Stepping Correction - Forward Normal: Recovers independently with a single, large step (second realignement is allowed).    Compensatory Stepping Correction - Backward Moderate: More than one step is required to recover equilibrium   4-5 steps   Compensatory Stepping Correction - Left Lateral Severe: Falls, or cannot step   Pt taking multiple steps, PT preventing pt from losing balance   Compensatory Stepping Correction - Right Lateral Normal:  Recovers independently with 1 step (crossover or lateral OK)    Stepping Corredtion Lateral - lowest score 0    Stance - Feet together, eyes open, firm surface  Normal: 30s    Stance - Feet together, eyes closed, foam surface  Moderate: < 30s   10 seconds   Timed UP & GO with Dual Task Moderate: Dual Task affects either counting OR walking (>10%) when compared to the TUG without Dual Task.      Timed Up and Go Test   Normal TUG (seconds) 13.53    Manual TUG (seconds) 16    Cognitive TUG (seconds) 20.1    TUG Comments All with SPC.            Will finish miniBEST at next session.     TODAY'S TREATMENT:  N/A during eval.    PATIENT EDUCATION: Education details: Clinical findings, POC, importance of exercise with PD, education about PD and areas that therapy will address. Pt discussed difficulties with iADLs and coordination/fine motor tasks - discussed OT referral and purpose of OT with PD with pt in agreement for referral. Pt reports that she will be going on a trip to [place] in a month with her family, PT educating pt possibility of using a rollator for mobility for endurance/balance instead of a cane (pt reports that she has one at home).  Person educated: Patient Education method: Explanation Education comprehension: verbalized understanding   HOME EXERCISE PROGRAM: Will provide at next session.     GOALS: Goals reviewed with patient? Yes  SHORT TERM GOALS: Target date: 01/12/2022  Pt will be independent with initial HEP in order to build upon functional gains made in therapy.  Baseline: Goal status: INITIAL  2.  Finish assessing miniBEST with STG/LTG written. Baseline:  Goal status: INITIAL  3.  Pt will improve 5x sit<>stand to less than or equal to 24 sec with no UE support to demonstrate improved functional strength and transfer efficiency.  Baseline: 28.59 seconds with no UE support Goal status: INITIAL  4.  Pt will improve gait speed with LRAD to at least  2.3 ft/sec in order to demo improved community mobility.  Baseline: 16.58 seconds = 1.95 ft/sec with SPC Goal status: INITIAL  5.  Pt will improve manual TUG to 14 seconds or less in order to decr fall risk. Baseline: 16 seconds with SPC  Goal status: INITIAL   LONG TERM GOALS: Target date: 02/09/2022  Pt will be independent with final HEP for PD specific deficits/balance in order to build upon functional gains made in therapy. Baseline:  Goal status: INITIAL  2.  miniBEST goal to be written as appropriate.  Baseline:  Goal status: INITIAL  3.  Pt will improve gait speed with LRAD to at least 2.6 ft/sec in order to demo improved community mobility.  Baseline: 16.58 seconds = 1.95 ft/sec with SPC Goal status: INITIAL  4.  Pt will improve 5x sit<>stand to less than or equal to 18 sec with no UE support to demonstrate improved functional strength and transfer efficiency.  Baseline: 28.59 seconds with no UE support Goal status: INITIAL  5.  Pt will verbalize understanding of local Parkinson's disease resources, including options for continued community fitness. Baseline:  Goal status: INITIAL    ASSESSMENT:  CLINICAL IMPRESSION: Patient is a 66 year old female referred to Neuro OPPT for Parkinsonism (diagnosed in 2018). Pt has not received PD specific therapy in the past. Pt currently ambulates with a SPC. Pt has had ~10 falls over the past 6 months. The following deficits were present during the exam: gait abnormalities, impaired balance, postural abnormalities, difficulties with dual tasking, rigidity, bradykinesia, decr strength, impaired coordination. Based on 5x sit <> stand, gait speed with SPC, and TUG pt is an incr risk for falls. Will finish assessing miniBEST at next session to determine fall risk. Pt would benefit from skilled PT to address these impairments and functional limitations to maximize functional mobility independence and decr fall risk.     OBJECTIVE  IMPAIRMENTS Abnormal gait, decreased activity tolerance, decreased balance, decreased coordination, difficulty walking, decreased strength, impaired flexibility, impaired tone, and postural dysfunction.   ACTIVITY LIMITATIONS bending, squatting, stairs, transfers, and locomotion level  PARTICIPATION LIMITATIONS: driving and community activity  PERSONAL FACTORS Behavior pattern, Past/current experiences, Time since onset of injury/illness/exacerbation, and 3+ comorbidities: Parkinsonism (2018),TIA, thyroid disease, kidney disease, HTN, HLD, GERD, DMII, depression, L hip avascular necrosis, anxiety, anemia, R THA 2011, L THA 2015, knee arthroscopy   are also affecting patient's functional outcome.   REHAB POTENTIAL: Good  CLINICAL DECISION MAKING: Evolving/moderate complexity  EVALUATION COMPLEXITY: Moderate  PLAN: PT FREQUENCY: 2x/week  PT DURATION: 12 weeks  PLANNED INTERVENTIONS: Therapeutic exercises, Therapeutic activity, Neuromuscular re-education, Balance training, Gait training, Patient/Family education, Self Care, Stair training, Vestibular training, DME instructions, Manual therapy, and Re-evaluation  PLAN FOR NEXT SESSION: Finish miniBEST and write goal. Initial HEP for balance - esp stepping strategies, functional strength. Try PWR moves - either standing/mod quadruped. Put in order for OT, pt would like to be scheduled until returning from [place] (end of October)    Arliss Journey, PT, DPT  12/15/2021, 1:36 PM

## 2021-12-17 DIAGNOSIS — E042 Nontoxic multinodular goiter: Secondary | ICD-10-CM | POA: Diagnosis not present

## 2021-12-17 DIAGNOSIS — R9389 Abnormal findings on diagnostic imaging of other specified body structures: Secondary | ICD-10-CM | POA: Diagnosis not present

## 2021-12-17 DIAGNOSIS — N1832 Chronic kidney disease, stage 3b: Secondary | ICD-10-CM | POA: Diagnosis not present

## 2021-12-17 DIAGNOSIS — E1151 Type 2 diabetes mellitus with diabetic peripheral angiopathy without gangrene: Secondary | ICD-10-CM | POA: Diagnosis not present

## 2021-12-20 ENCOUNTER — Other Ambulatory Visit: Payer: Self-pay | Admitting: Internal Medicine

## 2021-12-20 DIAGNOSIS — E042 Nontoxic multinodular goiter: Secondary | ICD-10-CM

## 2021-12-21 ENCOUNTER — Ambulatory Visit: Payer: 59 | Admitting: Physical Therapy

## 2021-12-22 ENCOUNTER — Other Ambulatory Visit (HOSPITAL_COMMUNITY): Payer: Self-pay

## 2021-12-22 ENCOUNTER — Ambulatory Visit: Payer: 59 | Admitting: Physical Therapy

## 2021-12-22 DIAGNOSIS — R2689 Other abnormalities of gait and mobility: Secondary | ICD-10-CM

## 2021-12-22 DIAGNOSIS — R278 Other lack of coordination: Secondary | ICD-10-CM | POA: Diagnosis not present

## 2021-12-22 DIAGNOSIS — M6281 Muscle weakness (generalized): Secondary | ICD-10-CM

## 2021-12-22 DIAGNOSIS — R2681 Unsteadiness on feet: Secondary | ICD-10-CM

## 2021-12-22 DIAGNOSIS — R41840 Attention and concentration deficit: Secondary | ICD-10-CM | POA: Diagnosis not present

## 2021-12-22 DIAGNOSIS — R29818 Other symptoms and signs involving the nervous system: Secondary | ICD-10-CM | POA: Diagnosis not present

## 2021-12-22 NOTE — Therapy (Signed)
OUTPATIENT PHYSICAL THERAPY NEURO TREATMENT   Patient Name: Deanna Rowe MRN: [id number] DOB:04-06-1955, 65 y.o., female Today's Date: 12/22/2021   PCP: Aretta Nip, MD REFERRING PROVIDER: Ward Givens, NP    PT End of Session - 12/22/21 1105     Visit Number 2    Number of Visits 17    Date for PT Re-Evaluation 03/15/22   due to delay in scheduling/pt going on vacation   Authorization Type [employer]    PT Start Time 1103    PT Stop Time 1145    PT Time Calculation (min) 42 min    Equipment Utilized During Treatment Gait belt    Activity Tolerance Patient tolerated treatment well    Behavior During Therapy WFL for tasks assessed/performed              Past Medical History:  Diagnosis Date   Anemia    Anxiety    Arthritis    L HIP   Asthma    Avascular necrosis of hip ([place])    LEFT   Back pain    Chest pain    Constipation    Depression    Diabetes mellitus    Diabetes mellitus, type II (HCC)    Dry cough    Edema, lower extremity    Gait abnormality 11/18/2019   GERD (gastroesophageal reflux disease)    High cholesterol    Hypertension    Insomnia    takes Ambien nightly   Joint pain    Kidney disease    Kidney disease    Obesity    Parkinsonism ([place]) 03/02/2017   PONV (postoperative nausea and vomiting)    RLS (restless legs syndrome) 01/15/2018   Sleep apnea    Swallowing difficulty    Thyroid disease    TIA (transient ischemic attack) 2012   no residual problems   Urgency incontinence    Vitamin D deficiency    Past Surgical History:  Procedure Laterality Date   DILATION AND CURETTAGE OF UTERUS     ESOPHAGEAL MANOMETRY N/A 09/03/2012   Procedure: ESOPHAGEAL MANOMETRY (EM);  Surgeon: Garlan Fair, MD;  Location: [facility];  Service: Endoscopy;  Laterality: N/A;   ESOPHAGEAL MANOMETRY N/A 06/19/2017   Procedure: ESOPHAGEAL MANOMETRY (EM);  Surgeon: Clarene Essex, MD;  Location: [facility];  Service: Endoscopy;   Laterality: N/A;   EXPLORATORY LAPAROTOMY     GANGLION CYST EXCISION     JOINT REPLACEMENT  2011   rt total hip   KNEE ARTHROSCOPY     PILONIDAL CYST / SINUS EXCISION     TOTAL HIP ARTHROPLASTY Left 10/09/2013   Procedure: LEFT TOTAL HIP ARTHROPLASTY ANTERIOR APPROACH;  Surgeon: [name], MD;  Location: [facility];  Service: Orthopedics;  Laterality: Left;   Patient Active Problem List   Diagnosis Date Noted   Allergic conjunctivitis of both eyes 07/06/2020   Gait abnormality 11/18/2019   OSA on CPAP 06/04/2019   Pain in joint of left shoulder 03/28/2019   Seasonal allergic conjunctivitis 02/07/2019   Chronic intermittent hypoxia with obstructive sleep apnea 01/25/2019   Severe obstructive sleep apnea 01/25/2019   Excessive daytime sleepiness 01/03/2019   Non-restorative sleep 01/03/2019   Nocturia more than twice per night 01/03/2019   Weight gain, abnormal 01/03/2019   Snorings 01/03/2019   Trochanteric bursitis of right hip 04/27/2018   Encounter for routine screening for malformation using ultrasonics 02/08/2018   Abnormal weight gain 02/08/2018   Acid indigestion 02/08/2018  Acute antritis 02/08/2018   Class 2 severe obesity with serious comorbidity and body mass index (BMI) of 39.0 to 39.9 in adult ([place]) 02/08/2018   Perennial allergic rhinitis 02/08/2018   Antiphospholipid syndrome ([place]) 02/08/2018   Arthralgia of hip or thigh 02/08/2018   Asthma, cough variant 02/08/2018   Vitamin D deficiency 02/08/2018   Backhand tennis elbow 02/08/2018   Cannot sleep 02/08/2018   Chronic kidney disease (CKD), stage III (moderate) ([place]) 02/08/2018   Current drug use 02/08/2018   Depression, major, single episode, in partial remission ([place]) 02/08/2018   Diabetes mellitus with polyneuropathy ([place]) 02/08/2018   Difficulty hearing 02/08/2018   Disturbance of skin sensation 02/08/2018   Hypertension associated with diabetes ([place]) 02/08/2018   Extremity pain 02/08/2018   Facet  syndrome, lumbar 02/08/2018   Factor V Leiden ([place]) 02/08/2018   Fungal infection of nail 02/08/2018   Gastroenteritis and colitis, viral 02/08/2018   Hypoglycemia 02/08/2018   Menopause 02/08/2018   Other long term (current) drug therapy 02/08/2018   Other osteonecrosis, unspecified femur ([place]) 02/08/2018   Pure hypercholesterolemia 02/08/2018   Recurrent major depressive episodes ([place]) 02/08/2018   Syncope and collapse 02/08/2018   Not well controlled moderate persistent asthma 02/08/2018   Cough, persistent 02/08/2018   Restless leg syndrome 01/15/2018   Degeneration of lumbar intervertebral disc 11/21/2017   Tremor of right hand 03/02/2017   Parkinson disease ([place]) 03/02/2017   UTI (urinary tract infection) 12/01/2015   Type 2 diabetes mellitus with stage 3b chronic kidney disease, with long-term current use of insulin ([place]) 06/03/2015   Fast heart beat 04/09/2015   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 2 diabetes mellitus ([place]) 04/24/2014   Postoperative anemia due to acute blood loss 10/10/2013   Avascular necrosis of bone of left hip ([place]) 10/09/2013   Arthritis of pelvic region, degenerative 10/09/2013   Breath shortness 06/18/2013   Diabetes mellitus type 2, uncontrolled 05/31/2013   Intermittent claudication ([place]) 04/04/2013   Anxiety    Gastroesophageal reflux disease 09/12/2012   Clinical depression 02/21/2012   Stroke ([place]) 02/11/2012   History of revision of total replacement of right hip joint 04/04/2009    ONSET DATE: 12/02/2021   REFERRING DIAG: G20 (ICD-10-CM) - Parkinson disease ([place]) R29.6 (ICD-10-CM) - Falls frequently   THERAPY DIAG:  Unsteadiness on feet  Muscle weakness (generalized)  Other abnormalities of gait and mobility  Rationale for Evaluation and Treatment Rehabilitation  SUBJECTIVE:  SUBJECTIVE STATEMENT: Pt reports she is doing well, no new falls or changes to report.   Pt accompanied by: self  PERTINENT HISTORY: Parkinsonism (2018),TIA, thyroid disease, kidney disease, HTN, HLD, GERD, DMII, depression, L hip avascular necrosis, anxiety, anemia, R THA 2011, L THA 2015, knee arthroscopy   PAIN:  Are you having pain? No  PRECAUTIONS: Fall, No driving.   FALLS: Has patient fallen in last 6 months? Yes. Number of falls 10  PLOF: Independent with community mobility with device  PATIENT GOALS Wants to stop falling, improve balance.   OBJECTIVE:   COGNITION: Overall cognitive status: Impaired   TODAY'S TREATMENT:  Therapeutic Activity   [facility] PT Assessment - 12/22/21 1110       Mini-BESTest   Sit To Stand Normal: Comes to stand without use of hands and stabilizes independently.    Rise to Toes < 3 s.    Stand on one leg (left) Moderate: < 20 s    Stand on one leg (right) Moderate: < 20 s    Stand on one leg - lowest score 1    Compensatory Stepping Correction - Forward Normal: Recovers independently with a single, large step (second realignement is allowed).    Compensatory Stepping Correction - Backward Moderate: More than one step is required to recover equilibrium    Compensatory Stepping Correction - Left Lateral Severe: Falls, or cannot step    Compensatory Stepping Correction - Right Lateral Normal: Recovers independently with 1 step (crossover or lateral OK)    Stepping Corredtion Lateral - lowest score 0    Stance - Feet together, eyes open, firm surface  Normal: 30s    Stance - Feet together, eyes closed, foam surface  Moderate: < 30s    Incline - Eyes Closed Normal: Stands independently 30s and aligns with gravity    Change in Gait Speed Severe: Unable to achieve significant change in walking speed AND signs of imbalance.    Walk with head turns - Horizontal Normal: performs head turns with no change in gait speed and good balance     Walk with pivot turns Moderate:Turns with feet close SLOW (>4 steps) with good balance.    Step over obstacles Moderate: Steps over box but touches box OR displays cautious behavior by slowing gait.    Timed UP & GO with Dual Task Moderate: Dual Task affects either counting OR walking (>10%) when compared to the TUG without Dual Task.    Mini-BEST total score 16            NMR  Established and demonstrated initial HEP for improved anterior weight shifting, stepping strategy and transfers:   - Sit to Stand Without Arm Support, 2x7 reps. Mod multimodal cues for proper body mechanics and to facilitate anterior weight shift using NDT principles. Pt initially unable to use momentum to shift weight forward but w/practice, performed x5 reps without retropulsion or bracing.    - Heel Raises with Counter Support, x15 reps. Min cues to reduce BUE to single UE support for added challenge   - Step sideways with arms reaching at counter using post-its as visual targets to reach towards, x8 per side. Min cues for increased step length/clearance of RLE >LLE   - Alternating Step Backward with Support , x10 per side. Min cues for increased step length/clearance   - Standing Quarter Turn with Counter Support, x10 per side. Pt initially required mod cues for proper sequencing of BLEs, as she was unable to coordinate leading w/ipsilateral  foot as direction she was turning. W/practice, pt able to perform w/intermittent min cues to maintain sequence.     GAIT: Gait pattern: decreased arm swing- Left, decreased stride length, Right foot flat, Left foot flat, decreased trunk rotation, and trunk flexed Distance walked: Clinic distances. Assistive device utilized: Single point cane and None Level of assistance: SBA Comments: Pt cautious/slow when turning.    PATIENT EDUCATION: Education details: MiniBest results, initial HEP  Person educated: Patient Education method: Explanation and  Handouts Education comprehension: verbalized understanding and needs further education   HOME EXERCISE PROGRAM: Access Code: [email or web address] URL: [email or web address] Date: 12/22/2021 Prepared by: Mickie Bail Joseth Weigel  Exercises - Sit to Stand Without Arm Support  - 1 x daily - 7 x weekly - 2 sets - 5-6 reps - Heel Raises with Counter Support  - 1 x daily - 7 x weekly - 3 sets - 10 reps - Step sideways with arms reaching at counter   - 1 x daily - 7 x weekly - 3 sets - 10 reps - Alternating Step Backward with Support  - 1 x daily - 7 x weekly - 3 sets - 10 reps - Standing Quarter Turn with Counter Support  - 1 x daily - 7 x weekly - 3 sets - 10 reps    GOALS: Goals reviewed with patient? Yes  SHORT TERM GOALS: Target date: 01/12/2022  Pt will be independent with initial HEP in order to build upon functional gains made in therapy.  Baseline: Goal status: INITIAL  2.  Pt will improve MiniBest to 19/28 for decreased fall risk and improvement with compensatory stepping strategies.   Baseline: 16/28 Goal status: REVISED  3.  Pt will improve 5x sit<>stand to less than or equal to 24 sec with no UE support to demonstrate improved functional strength and transfer efficiency.  Baseline: 28.59 seconds with no UE support Goal status: INITIAL  4.  Pt will improve gait speed with LRAD to at least 2.3 ft/sec in order to demo improved community mobility.  Baseline: 16.58 seconds = 1.95 ft/sec with SPC Goal status: INITIAL  5.  Pt will improve manual TUG to 14 seconds or less in order to decr fall risk. Baseline: 16 seconds with SPC  Goal status: INITIAL   LONG TERM GOALS: Target date: 02/09/2022  Pt will be independent with final HEP for PD specific deficits/balance in order to build upon functional gains made in therapy. Baseline:  Goal status: INITIAL  2.  Pt will improve MiniBest to 23/28 for decreased fall risk and improvement with compensatory stepping strategies.    Baseline: 16/28 Goal status: REVISED  3.  Pt will improve gait speed with LRAD to at least 2.6 ft/sec in order to demo improved community mobility.  Baseline: 16.58 seconds = 1.95 ft/sec with SPC Goal status: INITIAL  4.  Pt will improve 5x sit<>stand to less than or equal to 18 sec with no UE support to demonstrate improved functional strength and transfer efficiency.  Baseline: 28.59 seconds with no UE support Goal status: INITIAL  5.  Pt will verbalize understanding of local Parkinson's disease resources, including options for continued community fitness. Baseline:  Goal status: INITIAL    ASSESSMENT:  CLINICAL IMPRESSION: Emphasis of skilled PT session on completing balance assessment from evaluation and establishing initial HEP to address deficits highlighted by balance assessment. Pt scored a 16/28 on MiniBest, indicative of high fall risk. Pt exhibited the greatest difficulty w/stepping strategies, anterior weight shifting and gait velocity. Established and  demonstrated initial HEP to address turns, stepping strategies and anterior weight shifting. Pt performed well but requires min-mod verbal cues to maintain sequence of movement. Continue POC.      OBJECTIVE IMPAIRMENTS Abnormal gait, decreased activity tolerance, decreased balance, decreased coordination, difficulty walking, decreased strength, impaired flexibility, impaired tone, and postural dysfunction.   ACTIVITY LIMITATIONS bending, squatting, stairs, transfers, and locomotion level  PARTICIPATION LIMITATIONS: driving and community activity  PERSONAL FACTORS Behavior pattern, Past/current experiences, Time since onset of injury/illness/exacerbation, and 3+ comorbidities: Parkinsonism (2018),TIA, thyroid disease, kidney disease, HTN, HLD, GERD, DMII, depression, L hip avascular necrosis, anxiety, anemia, R THA 2011, L THA 2015, knee arthroscopy   are also affecting patient's functional outcome.   REHAB POTENTIAL:  Good  CLINICAL DECISION MAKING: Evolving/moderate complexity  EVALUATION COMPLEXITY: Moderate  PLAN: PT FREQUENCY: 2x/week  PT DURATION: 12 weeks  PLANNED INTERVENTIONS: Therapeutic exercises, Therapeutic activity, Neuromuscular re-education, Balance training, Gait training, Patient/Family education, Self Care, Stair training, Vestibular training, DME instructions, Manual therapy, and Re-evaluation  PLAN FOR NEXT SESSION: Add to HEP prn for balance - esp stepping strategies, functional strength. Try PWR moves - either standing/mod quadruped. Sit <>stands on wedge, scifit for endurance,forward lunge over beam, rotation w/cone, resisted walking   Miamarie Moll E Clevester Helzer, PT, DPT  12/22/2021, 11:48 AM

## 2021-12-23 ENCOUNTER — Ambulatory Visit: Payer: 59 | Admitting: Physical Therapy

## 2021-12-23 ENCOUNTER — Ambulatory Visit (INDEPENDENT_AMBULATORY_CARE_PROVIDER_SITE_OTHER): Payer: 59 | Admitting: Nurse Practitioner

## 2021-12-23 ENCOUNTER — Other Ambulatory Visit (HOSPITAL_COMMUNITY): Payer: Self-pay

## 2021-12-23 ENCOUNTER — Ambulatory Visit: Payer: 59 | Admitting: Occupational Therapy

## 2021-12-23 ENCOUNTER — Other Ambulatory Visit (HOSPITAL_BASED_OUTPATIENT_CLINIC_OR_DEPARTMENT_OTHER): Payer: Self-pay

## 2021-12-23 ENCOUNTER — Encounter: Payer: Self-pay | Admitting: Occupational Therapy

## 2021-12-23 ENCOUNTER — Encounter (INDEPENDENT_AMBULATORY_CARE_PROVIDER_SITE_OTHER): Payer: Self-pay | Admitting: Nurse Practitioner

## 2021-12-23 VITALS — BP 102/66 | HR 69 | Temp 98.2°F | Ht 66.0 in | Wt 208.0 lb

## 2021-12-23 DIAGNOSIS — R296 Repeated falls: Secondary | ICD-10-CM | POA: Diagnosis not present

## 2021-12-23 DIAGNOSIS — E1122 Type 2 diabetes mellitus with diabetic chronic kidney disease: Secondary | ICD-10-CM

## 2021-12-23 DIAGNOSIS — R41840 Attention and concentration deficit: Secondary | ICD-10-CM | POA: Diagnosis not present

## 2021-12-23 DIAGNOSIS — R2681 Unsteadiness on feet: Secondary | ICD-10-CM

## 2021-12-23 DIAGNOSIS — R29818 Other symptoms and signs involving the nervous system: Secondary | ICD-10-CM

## 2021-12-23 DIAGNOSIS — N1832 Chronic kidney disease, stage 3b: Secondary | ICD-10-CM | POA: Diagnosis not present

## 2021-12-23 DIAGNOSIS — Z6833 Body mass index (BMI) 33.0-33.9, adult: Secondary | ICD-10-CM

## 2021-12-23 DIAGNOSIS — R278 Other lack of coordination: Secondary | ICD-10-CM | POA: Diagnosis not present

## 2021-12-23 DIAGNOSIS — R2689 Other abnormalities of gait and mobility: Secondary | ICD-10-CM

## 2021-12-23 DIAGNOSIS — M6281 Muscle weakness (generalized): Secondary | ICD-10-CM | POA: Diagnosis not present

## 2021-12-23 DIAGNOSIS — Z794 Long term (current) use of insulin: Secondary | ICD-10-CM | POA: Diagnosis not present

## 2021-12-23 DIAGNOSIS — G20 Parkinson's disease: Secondary | ICD-10-CM | POA: Diagnosis not present

## 2021-12-23 DIAGNOSIS — E669 Obesity, unspecified: Secondary | ICD-10-CM

## 2021-12-23 DIAGNOSIS — R41844 Frontal lobe and executive function deficit: Secondary | ICD-10-CM

## 2021-12-23 MED ORDER — FLUAD QUADRIVALENT 0.5 ML IM PRSY
PREFILLED_SYRINGE | INTRAMUSCULAR | 0 refills | Status: DC
  Filled 2021-12-23: qty 0.5, 1d supply, fill #0

## 2021-12-23 NOTE — Therapy (Signed)
OUTPATIENT PHYSICAL THERAPY NEURO TREATMENT   Patient Name: Deanna Rowe MRN: [id number] DOB:[birth year], 66 y.o., female Today's Date: 12/23/2021   PCP: Aretta Nip, MD REFERRING PROVIDER: Ward Givens, NP    PT End of Session - 12/23/21 0850     Visit Number 3    Number of Visits 17    Date for PT Re-Evaluation 03/15/22   due to delay in scheduling/pt going on vacation   Authorization Type [facility]    PT Start Time 0277    PT Stop Time 0927    PT Time Calculation (min) 39 min    Equipment Utilized During Treatment Gait belt    Activity Tolerance Patient tolerated treatment well    Behavior During Therapy [facility] for tasks assessed/performed               Past Medical History:  Diagnosis Date   Anemia    Anxiety    Arthritis    L HIP   Asthma    Avascular necrosis of hip ([place])    LEFT   Back pain    Chest pain    Constipation    Depression    Diabetes mellitus    Diabetes mellitus, type II (HCC)    Dry cough    Edema, lower extremity    Gait abnormality 11/18/2019   GERD (gastroesophageal reflux disease)    High cholesterol    Hypertension    Insomnia    takes Ambien nightly   Joint pain    Kidney disease    Kidney disease    Obesity    Parkinsonism ([place]) 03/02/2017   PONV (postoperative nausea and vomiting)    RLS (restless legs syndrome) 01/15/2018   Sleep apnea    Swallowing difficulty    Thyroid disease    TIA (transient ischemic attack) 2012   no residual problems   Urgency incontinence    Vitamin D deficiency    Past Surgical History:  Procedure Laterality Date   DILATION AND CURETTAGE OF UTERUS     ESOPHAGEAL MANOMETRY N/A 09/03/2012   Procedure: ESOPHAGEAL MANOMETRY (EM);  Surgeon: Garlan Fair, MD;  Location: [facility];  Service: Endoscopy;  Laterality: N/A;   ESOPHAGEAL MANOMETRY N/A 06/19/2017   Procedure: ESOPHAGEAL MANOMETRY (EM);  Surgeon: Clarene Essex, MD;  Location: [facility];  Service: Endoscopy;   Laterality: N/A;   EXPLORATORY LAPAROTOMY     GANGLION CYST EXCISION     JOINT REPLACEMENT  2011   rt total hip   KNEE ARTHROSCOPY     PILONIDAL CYST / SINUS EXCISION     TOTAL HIP ARTHROPLASTY Left 10/09/2013   Procedure: LEFT TOTAL HIP ARTHROPLASTY ANTERIOR APPROACH;  Surgeon: [name], MD;  Location: [facility];  Service: Orthopedics;  Laterality: Left;   Patient Active Problem List   Diagnosis Date Noted   Allergic conjunctivitis of both eyes 07/06/2020   Gait abnormality 11/18/2019   OSA on CPAP 06/04/2019   Pain in joint of left shoulder 03/28/2019   Seasonal allergic conjunctivitis 02/07/2019   Chronic intermittent hypoxia with obstructive sleep apnea 01/25/2019   Severe obstructive sleep apnea 01/25/2019   Excessive daytime sleepiness 01/03/2019   Non-restorative sleep 01/03/2019   Nocturia more than twice per night 01/03/2019   Weight gain, abnormal 01/03/2019   Snorings 01/03/2019   Trochanteric bursitis of right hip 04/27/2018   Encounter for routine screening for malformation using ultrasonics 02/08/2018   Abnormal weight gain 02/08/2018   Acid indigestion 02/08/2018  Acute antritis 02/08/2018   Class 2 severe obesity with serious comorbidity and body mass index (BMI) of 39.0 to 39.9 in adult ([place]) 02/08/2018   Perennial allergic rhinitis 02/08/2018   Antiphospholipid syndrome ([place]) 02/08/2018   Arthralgia of hip or thigh 02/08/2018   Asthma, cough variant 02/08/2018   Vitamin D deficiency 02/08/2018   Backhand tennis elbow 02/08/2018   Cannot sleep 02/08/2018   Chronic kidney disease (CKD), stage III (moderate) ([place]) 02/08/2018   Current drug use 02/08/2018   Depression, major, single episode, in partial remission ([place]) 02/08/2018   Diabetes mellitus with polyneuropathy ([place]) 02/08/2018   Difficulty hearing 02/08/2018   Disturbance of skin sensation 02/08/2018   Hypertension associated with diabetes ([place]) 02/08/2018   Extremity pain 02/08/2018   Facet  syndrome, lumbar 02/08/2018   Factor V Leiden ([place]) 02/08/2018   Fungal infection of nail 02/08/2018   Gastroenteritis and colitis, viral 02/08/2018   Hypoglycemia 02/08/2018   Menopause 02/08/2018   Other long term (current) drug therapy 02/08/2018   Other osteonecrosis, unspecified femur ([place]) 02/08/2018   Pure hypercholesterolemia 02/08/2018   Recurrent major depressive episodes ([place]) 02/08/2018   Syncope and collapse 02/08/2018   Not well controlled moderate persistent asthma 02/08/2018   Cough, persistent 02/08/2018   Restless leg syndrome 01/15/2018   Degeneration of lumbar intervertebral disc 11/21/2017   Tremor of right hand 03/02/2017   Parkinson disease ([place]) 03/02/2017   UTI (urinary tract infection) 12/01/2015   Type 2 diabetes mellitus with stage 3b chronic kidney disease, with long-term current use of insulin ([place]) 06/03/2015   Fast heart beat 04/09/2015   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 2 diabetes mellitus ([place]) 04/24/2014   Postoperative anemia due to acute blood loss 10/10/2013   Avascular necrosis of bone of left hip ([place]) 10/09/2013   Arthritis of pelvic region, degenerative 10/09/2013   Breath shortness 06/18/2013   Diabetes mellitus type 2, uncontrolled 05/31/2013   Intermittent claudication ([place]) 04/04/2013   Anxiety    Gastroesophageal reflux disease 09/12/2012   Clinical depression 02/21/2012   Stroke ([place]) 02/11/2012   History of revision of total replacement of right hip joint 04/04/2009    ONSET DATE: 12/02/2021   REFERRING DIAG: G20 (ICD-10-CM) - Parkinson disease ([place]) R29.6 (ICD-10-CM) - Falls frequently   THERAPY DIAG:  Unsteadiness on feet  Muscle weakness (generalized)  Other abnormalities of gait and mobility  Rationale for Evaluation and Treatment Rehabilitation  SUBJECTIVE:  SUBJECTIVE STATEMENT: No new changes since yesterday. "I was so tired when I got home yesterday, I have to get used to all of this exercise".  Pt accompanied by: self  PERTINENT HISTORY: Parkinsonism (2018),TIA, thyroid disease, kidney disease, HTN, HLD, GERD, DMII, depression, L hip avascular necrosis, anxiety, anemia, R THA 2011, L THA 2015, knee arthroscopy   PAIN:  Are you having pain? No  PRECAUTIONS: Fall, No driving.   FALLS: Has patient fallen in last 6 months? Yes. Number of falls 10  PLOF: Independent with community mobility with device  PATIENT GOALS Wants to stop falling, improve balance.   OBJECTIVE:   COGNITION: Overall cognitive status: Impaired   TODAY'S TREATMENT:  Therapeutic Exercise SciFit multi-peaks level 2 for 8 minutes using BUE/BLEs for neural priming for reciprocal movement, dynamic cardiovascular warmup and increased amplitude of stepping. RPE of 6/10 following activity.   NMR  In // bars for improved BLE coordination, dual-tasking, single leg stability and stepping strategies:  -Alt forward lunge over 4" beam without UE support. X5 per side. Noted hypokinetic movement w/progression of reps, so added colored dots for stepping target to maintain large step length. Pt frequently knocking foam beam over w/RLE. Progressed to adding ball catch/toss w/2nd therapist to introduce motor-motor dual task, x10 reps per side. Lastly added cog dual task (naming animals) w/each ball throw for added challenge and pt able to maintain sequence w/75% accuracy, frequently forgetting to throw ball back to therapist prior to stepping back over beam. CGA throughout for safety.  -On blue Airex, ipsilateral cone reach and contralateral stack for dynamic standing balance and reaching out of BOS. Pt demonstrated increased difficulty performing w/RUE compared to LUE, frequently bracing herself against rail on L side to stabilize when rotating to R side. CGA throughout  for safety. -Alt step ups w/contralateral march to airex without UE support, x10 per side. Pt unable to maintain alternating sequence without verbal cues and demonstrated bradykinetic march, making it difficult for pt to stabilize independently on foam. Pt relied on intermittent UE support for stabilization throughout.   -4 sit <>stands w/feet on blue airex for immediate standing balance, BLE strength and high amplitude movement. Pt able to perform all 4 reps without UE support and demonstrated good stability at top of rep, no retropulsion noted.    RPE of 8/10 following session   GAIT: Gait pattern: decreased arm swing- Left, decreased stride length, Right foot flat, Left foot flat, decreased trunk rotation, trunk flexed, and narrow BOS Distance walked: Clinic distances. Assistive device utilized: Single point cane and None Level of assistance: SBA Comments: Min cues for wider BOS and facilitation of arm swing    PATIENT EDUCATION: Education details: Continue HEP  Person educated: Patient Education method: Explanation and Handouts Education comprehension: verbalized understanding and needs further education   HOME EXERCISE PROGRAM: Access Code: [email or web address] URL: [email or web address] Date: 12/22/2021 Prepared by: Mickie Bail Sierrah Luevano  Exercises - Sit to Stand Without Arm Support  - 1 x daily - 7 x weekly - 2 sets - 5-6 reps - Heel Raises with Counter Support  - 1 x daily - 7 x weekly - 3 sets - 10 reps - Step sideways with arms reaching at counter   - 1 x daily - 7 x weekly - 3 sets - 10 reps - Alternating Step Backward with Support  - 1 x daily - 7 x weekly - 3 sets - 10 reps - Standing Quarter Turn with Counter Support  - 1 x daily -  7 x weekly - 3 sets - 10 reps    GOALS: Goals reviewed with patient? Yes  SHORT TERM GOALS: Target date: 01/12/2022  Pt will be independent with initial HEP in order to build upon functional gains made in therapy.  Baseline: Goal  status: INITIAL  2.  Pt will improve MiniBest to 19/28 for decreased fall risk and improvement with compensatory stepping strategies.   Baseline: 16/28 Goal status: REVISED  3.  Pt will improve 5x sit<>stand to less than or equal to 24 sec with no UE support to demonstrate improved functional strength and transfer efficiency.  Baseline: 28.59 seconds with no UE support Goal status: INITIAL  4.  Pt will improve gait speed with LRAD to at least 2.3 ft/sec in order to demo improved community mobility.  Baseline: 16.58 seconds = 1.95 ft/sec with SPC Goal status: INITIAL  5.  Pt will improve manual TUG to 14 seconds or less in order to decr fall risk. Baseline: 16 seconds with SPC  Goal status: INITIAL   LONG TERM GOALS: Target date: 02/09/2022  Pt will be independent with final HEP for PD specific deficits/balance in order to build upon functional gains made in therapy. Baseline:  Goal status: INITIAL  2.  Pt will improve MiniBest to 23/28 for decreased fall risk and improvement with compensatory stepping strategies.   Baseline: 16/28 Goal status: REVISED  3.  Pt will improve gait speed with LRAD to at least 2.6 ft/sec in order to demo improved community mobility.  Baseline: 16.58 seconds = 1.95 ft/sec with SPC Goal status: INITIAL  4.  Pt will improve 5x sit<>stand to less than or equal to 18 sec with no UE support to demonstrate improved functional strength and transfer efficiency.  Baseline: 28.59 seconds with no UE support Goal status: INITIAL  5.  Pt will verbalize understanding of local Parkinson's disease resources, including options for continued community fitness. Baseline:  Goal status: INITIAL    ASSESSMENT:  CLINICAL IMPRESSION: Emphasis of skilled PT session on dual-tasking, stepping strategies and single leg stability. Pt demonstrates difficulty maintaining alternating sequences and requires cues for high amplitude movement. Pt exhibits decreased step  length/clearance of RLE and narrow BOS w/gait. Also noted R ankle instability (excess supination in MS-TO). Continue POC.     OBJECTIVE IMPAIRMENTS Abnormal gait, decreased activity tolerance, decreased balance, decreased coordination, difficulty walking, decreased strength, impaired flexibility, impaired tone, and postural dysfunction.   ACTIVITY LIMITATIONS bending, squatting, stairs, transfers, and locomotion level  PARTICIPATION LIMITATIONS: driving and community activity  PERSONAL FACTORS Behavior pattern, Past/current experiences, Time since onset of injury/illness/exacerbation, and 3+ comorbidities: Parkinsonism (2018),TIA, thyroid disease, kidney disease, HTN, HLD, GERD, DMII, depression, L hip avascular necrosis, anxiety, anemia, R THA 2011, L THA 2015, knee arthroscopy   are also affecting patient's functional outcome.   REHAB POTENTIAL: Good  CLINICAL DECISION MAKING: Evolving/moderate complexity  EVALUATION COMPLEXITY: Moderate  PLAN: PT FREQUENCY: 2x/week  PT DURATION: 12 weeks  PLANNED INTERVENTIONS: Therapeutic exercises, Therapeutic activity, Neuromuscular re-education, Balance training, Gait training, Patient/Family education, Self Care, Stair training, Vestibular training, DME instructions, Manual therapy, and Re-evaluation  PLAN FOR NEXT SESSION: Add to HEP prn for balance - esp stepping strategies, functional strength. Try PWR moves - either standing/mod quadruped. Sit <>stands on wedge, scifit for endurance,forward lunge over beam, rotation w/cone, resisted walking   Meeyah Ovitt E Adaly Puder, PT, DPT  12/23/2021, 9:30 AM

## 2021-12-23 NOTE — Therapy (Signed)
OUTPATIENT OCCUPATIONAL THERAPY PARKINSON'S EVALUATION  Patient Name: Deanna Rowe MRN: [id number] DOB:1956-01-12, 66 y.o., female Today's Date: 12/23/2021  PCP: Dr. Radene Ou REFERRING PROVIDER: Ward Givens NP   OT End of Session - 12/23/21 0840     Visit Number 1    Number of Visits 25    Date for OT Re-Evaluation 03/17/22    Authorization Type [facility]    Authorization - Visit Number 1    Authorization - Number of Visits 12   need auth after 25 th visit   OT Start Time 0803    OT Stop Time 0839    OT Time Calculation (min) 36 min    Behavior During Therapy [facility] for tasks assessed/performed             Past Medical History:  Diagnosis Date   Anemia    Anxiety    Arthritis    L HIP   Asthma    Avascular necrosis of hip ([place])    LEFT   Back pain    Chest pain    Constipation    Depression    Diabetes mellitus    Diabetes mellitus, type II (HCC)    Dry cough    Edema, lower extremity    Gait abnormality 11/18/2019   GERD (gastroesophageal reflux disease)    High cholesterol    Hypertension    Insomnia    takes Ambien nightly   Joint pain    Kidney disease    Kidney disease    Obesity    Parkinsonism ([place]) 03/02/2017   PONV (postoperative nausea and vomiting)    RLS (restless legs syndrome) 01/15/2018   Sleep apnea    Swallowing difficulty    Thyroid disease    TIA (transient ischemic attack) 2012   no residual problems   Urgency incontinence    Vitamin D deficiency    Past Surgical History:  Procedure Laterality Date   DILATION AND CURETTAGE OF UTERUS     ESOPHAGEAL MANOMETRY N/A 09/03/2012   Procedure: ESOPHAGEAL MANOMETRY (EM);  Surgeon: Garlan Fair, MD;  Location: [facility];  Service: Endoscopy;  Laterality: N/A;   ESOPHAGEAL MANOMETRY N/A 06/19/2017   Procedure: ESOPHAGEAL MANOMETRY (EM);  Surgeon: Clarene Essex, MD;  Location: [facility];  Service: Endoscopy;  Laterality: N/A;   EXPLORATORY LAPAROTOMY     GANGLION CYST EXCISION      JOINT REPLACEMENT  2011   rt total hip   KNEE ARTHROSCOPY     PILONIDAL CYST / SINUS EXCISION     TOTAL HIP ARTHROPLASTY Left 10/09/2013   Procedure: LEFT TOTAL HIP ARTHROPLASTY ANTERIOR APPROACH;  Surgeon: [name], MD;  Location: [facility];  Service: Orthopedics;  Laterality: Left;   Patient Active Problem List   Diagnosis Date Noted   Allergic conjunctivitis of both eyes 07/06/2020   Gait abnormality 11/18/2019   OSA on CPAP 06/04/2019   Pain in joint of left shoulder 03/28/2019   Seasonal allergic conjunctivitis 02/07/2019   Chronic intermittent hypoxia with obstructive sleep apnea 01/25/2019   Severe obstructive sleep apnea 01/25/2019   Excessive daytime sleepiness 01/03/2019   Non-restorative sleep 01/03/2019   Nocturia more than twice per night 01/03/2019   Weight gain, abnormal 01/03/2019   Snorings 01/03/2019   Trochanteric bursitis of right hip 04/27/2018   Encounter for routine screening for malformation using ultrasonics 02/08/2018   Abnormal weight gain 02/08/2018   Acid indigestion 02/08/2018   Acute antritis 02/08/2018   Class 2 severe obesity  with serious comorbidity and body mass index (BMI) of 39.0 to 39.9 in adult [facility]) 02/08/2018   Perennial allergic rhinitis 02/08/2018   Antiphospholipid syndrome ([place]) 02/08/2018   Arthralgia of hip or thigh 02/08/2018   Asthma, cough variant 02/08/2018   Vitamin D deficiency 02/08/2018   Backhand tennis elbow 02/08/2018   Cannot sleep 02/08/2018   Chronic kidney disease (CKD), stage III (moderate) (HCC) 02/08/2018   Current drug use 02/08/2018   Depression, major, single episode, in partial remission ([place]) 02/08/2018   Diabetes mellitus with polyneuropathy ([place]) 02/08/2018   Difficulty hearing 02/08/2018   Disturbance of skin sensation 02/08/2018   Hypertension associated with diabetes ([place]) 02/08/2018   Extremity pain 02/08/2018   Facet syndrome, lumbar 02/08/2018   Factor V Leiden ([place]) 02/08/2018   Fungal  infection of nail 02/08/2018   Gastroenteritis and colitis, viral 02/08/2018   Hypoglycemia 02/08/2018   Menopause 02/08/2018   Other long term (current) drug therapy 02/08/2018   Other osteonecrosis, unspecified femur ([place]) 02/08/2018   Pure hypercholesterolemia 02/08/2018   Recurrent major depressive episodes ([place]) 02/08/2018   Syncope and collapse 02/08/2018   Not well controlled moderate persistent asthma 02/08/2018   Cough, persistent 02/08/2018   Restless leg syndrome 01/15/2018   Degeneration of lumbar intervertebral disc 11/21/2017   Tremor of right hand 03/02/2017   Parkinson disease ([place]) 03/02/2017   UTI (urinary tract infection) 12/01/2015   Type 2 diabetes mellitus with stage 3b chronic kidney disease, with long-term current use of insulin ([place]) 06/03/2015   Fast heart beat 04/09/2015   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 2 diabetes mellitus ([place]) 04/24/2014   Postoperative anemia due to acute blood loss 10/10/2013   Avascular necrosis of bone of left hip ([place]) 10/09/2013   Arthritis of pelvic region, degenerative 10/09/2013   Breath shortness 06/18/2013   Diabetes mellitus type 2, uncontrolled 05/31/2013   Intermittent claudication ([place]) 04/04/2013   Anxiety    Gastroesophageal reflux disease 09/12/2012   Clinical depression 02/21/2012   Stroke ([place]) 02/11/2012   History of revision of total replacement of right hip joint 04/04/2009    ONSET DATE: 12/15/21  REFERRING DIAG: Parkinsonism  THERAPY DIAG:  Other lack of coordination - Plan: Ot plan of care cert/re-cert  Muscle weakness (generalized) - Plan: Ot plan of care cert/re-cert  Other symptoms and signs involving the nervous system - Plan: Ot plan of care cert/re-cert  Other abnormalities of gait and mobility - Plan: Ot plan of care cert/re-cert  Attention and concentration deficit - Plan: Ot plan of care cert/re-cert  Frontal lobe and executive function deficit - Plan: Ot plan of care cert/re-cert  Rationale for  Evaluation and Treatment Rehabilitation  SUBJECTIVE:   SUBJECTIVE STATEMENT: Pt would like to be able to style hair better Pt accompanied by: self  PERTINENT HISTORY: PERTINENT HISTORY: Parkinsonism (2018),TIA, thyroid disease, kidney disease, HTN, HLD, GERD, DMII, depression, L hip avascular necrosis, anxiety, anemia, R THA 2011, L THA 2015, knee arthroscopy   PRECAUTIONS: Fall  WEIGHT BEARING RESTRICTIONS No  PAIN:  Are you having pain? No  FALLS: Has patient fallen in last 6 months? Yes. Number of falls 5  LIVING ENVIRONMENT: Lives with: lives with their spouse Lives in: House/apartment  PLOF: Independent  PATIENT GOALS to be able style hair  OBJECTIVE:   HAND DOMINANCE: Right  ADLs: Overall ADLs: mod I with all basic ADLS Transfers/ambulation related to ADLs: Eating: mod I more splills Grooming: mod I, difficulty styling hair using curling iron UB Dressing: difficulty  with buttons, mod I LB Dressing: mod I Toileting: mod I Bathing: mod I Tub Shower transfers: mod I Equipment: Shower seat with back   IADLs: Shopping: goes with husband Light housekeeping: performs light tasks Meal Prep: pt cooks mod I Community mobility: mod ! With cane Medication management: pt handles own meds, has pillbox Financial management: pt's husband handles Handwriting: 100% legible  MOBILITY STATUS: Hx of falls and mod I with cane    ACTIVITY TOLERANCE: Activity tolerance: pt fatigues quickly  FUNCTIONAL OUTCOME MEASURES: Fastening/unfastening 3 buttons: 50.68 Physical performance test: PPT#2 (simulated eating) 15.03 & PPT#4 (donning/doffing jacket): 13.34  COORDINATION: 9 Hole Peg test: Right: 36.91 sec; Left: 38.16 sec Box and Blocks:  Right 33blocks, Left 37blocks  UE ROM:  WFL    SENSATION: Not tested      COGNITION: Overall cognitive status: Impaired, short term memory deficits   OBSERVATIONS: Bradykinesia   TODAY'S TREATMENT:  Eval  only   PATIENT EDUCATION: Education details: role of OT Person educated: Patient Education method: Explanation Education comprehension: verbalized understanding   HOME EXERCISE PROGRAM: N/a  ASSESSMENT:  CLINICAL IMPRESSION: Patient is a 66 y.o. female who was seen today for occupational therapy evaluation for Parkinsonism. PERTINENT HISTORY: Parkinsonism (2018),TIA, thyroid disease, kidney disease, HTN, HLD, GERD, DMII, depression, L hip avascular necrosis, anxiety, anemia, R THA 2011, L THA 2015, knee arthroscopy, hx of ECT treatments . Pt can benefit from skilled occupational therapy to address performance deficits to maximize pt's safety and I with ADLS/IADLS.  PERFORMANCE DEFICITS in functional skills including ADLs, IADLs, coordination, dexterity, proprioception, sensation, tone, ROM, strength, pain, flexibility, FMC, GMC, mobility, balance, endurance, decreased knowledge of precautions, decreased knowledge of use of DME, and UE functional use, bradykinesia , cognitive skills including attention, energy/drive, learn, memory, problem solving, safety awareness, thought, and understand, and psychosocial skills including coping strategies, environmental adaptation, habits, interpersonal interactions, and routines and behaviors.   IMPAIRMENTS are limiting patient from ADLs, IADLs, play, leisure, and social participation.   COMORBIDITIES may have co-morbidities  that affects occupational performance. Patient will benefit from skilled OT to address above impairments and improve overall function.  MODIFICATION OR ASSISTANCE TO COMPLETE EVALUATION: No modification of tasks or assist necessary to complete an evaluation.  OT OCCUPATIONAL PROFILE AND HISTORY: Detailed assessment: Review of records and additional review of physical, cognitive, psychosocial history related to current functional performance.  CLINICAL DECISION MAKING: LOW - limited treatment options, no task modification  necessary  REHAB POTENTIAL: Good  EVALUATION COMPLEXITY: Low     GOALS: Goals reviewed with patient? No  SHORT TERM GOALS: Target date: 01/20/22   I with PD specific HEP Baseline: Goal status: INITIAL  2.  Pt will verbalize understanding of adapted strategies to maximize safety and I with ADLs/ IADLs .  Baseline:  Goal status: INITIAL  3.  Pt will demonstrate improved bilateral UE functional use as evidenced by increasing box/ blocks score by 3 blocks Baseline: RUE 33, LUE 37 Goal status: INITIAL  4.  Pt will demonstrate improved ease with feeding as evidenced by decreasing PPT#2 (self feeding) by 3 secs Baseline: 15.03 Goal status: INITIAL  5.  Pt will report increased ease with styling her hair Baseline: Pt reports difficulty Goal status: INITIAL    LONG TERM GOALS: Target date: 03/17/22- Pt is currently scheduled 1 week outdiPOC  Pt will verbalize understanding of ways to prevent future PD related complications and PD community resources.  Baseline:  Goal status: INITIAL  2.  Pt will  demonstrate improved fine motor coordination for ADLs as evidenced by decreasing bilateral  9 hole peg test score for by 3 secs  Baseline: RUE 36.91, LUE 38.16 Goal status: INITIAL  3.  Pt will demonstrate understanding of memory compensations and ways to keep thinking skills sharp  Baseline:  Goal status: INITIAL  4.  Pt will demonstrate improved ease with fastening buttons as evidenced by decreasing 3 button/ unbutton time to 47 secs or less  Baseline: 50.68 Goal status: INITIAL   PLAN: OT FREQUENCY: 2x/week  OT DURATION: 12 weeks POC written for 12 weeks to account for scheduling  PLANNED INTERVENTIONS: self care/ADL training, therapeutic exercise, therapeutic activity, neuromuscular re-education, manual therapy, passive range of motion, balance training, stair training, functional mobility training, aquatic therapy, ultrasound, paraffin, fluidotherapy, moist heat,  cryotherapy, contrast bath, patient/family education, cognitive remediation/compensation, visual/perceptual remediation/compensation, energy conservation, coping strategies training, DME and/or AE instructions, and Re-evaluation  RECOMMENDED OTHER SERVICES: PT  CONSULTED AND AGREED WITH PLAN OF CARE: Patient  PLAN FOR NEXT SESSION: coordination HEP, PWR! hands, PWR! moves supine or seated   Terrill Wauters, OT 12/23/2021, 1:01 PM

## 2021-12-24 ENCOUNTER — Other Ambulatory Visit (HOSPITAL_BASED_OUTPATIENT_CLINIC_OR_DEPARTMENT_OTHER): Payer: Self-pay

## 2021-12-24 ENCOUNTER — Ambulatory Visit: Payer: 59 | Admitting: Physical Therapy

## 2021-12-24 MED ORDER — LOSARTAN POTASSIUM 100 MG PO TABS
100.0000 mg | ORAL_TABLET | Freq: Every day | ORAL | 3 refills | Status: DC
  Filled 2021-12-24: qty 90, 90d supply, fill #0
  Filled 2022-04-22: qty 90, 90d supply, fill #1
  Filled 2022-07-18: qty 90, 90d supply, fill #2
  Filled 2022-10-17: qty 90, 90d supply, fill #3
  Filled 2022-10-18 (×2): qty 30, 30d supply, fill #3
  Filled 2022-10-19: qty 90, 90d supply, fill #3
  Filled 2022-10-19: qty 30, 30d supply, fill #3
  Filled 2022-10-19: qty 90, 90d supply, fill #3
  Filled 2022-10-19 – 2022-10-20 (×3): qty 30, 30d supply, fill #3
  Filled 2022-11-16 – 2022-11-21 (×3): qty 30, 30d supply, fill #4
  Filled 2022-12-16: qty 30, 30d supply, fill #5

## 2021-12-27 ENCOUNTER — Ambulatory Visit: Payer: 59 | Admitting: Physical Therapy

## 2021-12-27 ENCOUNTER — Ambulatory Visit: Payer: 59 | Admitting: Occupational Therapy

## 2021-12-27 ENCOUNTER — Other Ambulatory Visit (HOSPITAL_COMMUNITY): Payer: Self-pay

## 2021-12-27 ENCOUNTER — Other Ambulatory Visit (HOSPITAL_BASED_OUTPATIENT_CLINIC_OR_DEPARTMENT_OTHER): Payer: Self-pay

## 2021-12-27 VITALS — BP 139/73 | HR 71

## 2021-12-27 DIAGNOSIS — R2681 Unsteadiness on feet: Secondary | ICD-10-CM

## 2021-12-27 DIAGNOSIS — R2689 Other abnormalities of gait and mobility: Secondary | ICD-10-CM | POA: Diagnosis not present

## 2021-12-27 DIAGNOSIS — R278 Other lack of coordination: Secondary | ICD-10-CM

## 2021-12-27 DIAGNOSIS — R29818 Other symptoms and signs involving the nervous system: Secondary | ICD-10-CM

## 2021-12-27 DIAGNOSIS — M6281 Muscle weakness (generalized): Secondary | ICD-10-CM | POA: Diagnosis not present

## 2021-12-27 DIAGNOSIS — R41840 Attention and concentration deficit: Secondary | ICD-10-CM | POA: Diagnosis not present

## 2021-12-27 DIAGNOSIS — G20 Parkinson's disease: Secondary | ICD-10-CM | POA: Diagnosis not present

## 2021-12-27 DIAGNOSIS — R41844 Frontal lobe and executive function deficit: Secondary | ICD-10-CM | POA: Diagnosis not present

## 2021-12-27 DIAGNOSIS — R296 Repeated falls: Secondary | ICD-10-CM | POA: Diagnosis not present

## 2021-12-27 NOTE — Therapy (Signed)
OUTPATIENT PHYSICAL THERAPY NEURO TREATMENT   Patient Name: Deanna Rowe MRN: [id number] DOB:01-Feb-1956, 66 y.o., female Today's Date: 12/27/2021   PCP: Aretta Nip, MD REFERRING PROVIDER: Ward Givens, NP    PT End of Session - 12/27/21 1234     Visit Number 4    Number of Visits 17    Date for PT Re-Evaluation 03/15/22   due to delay in scheduling/pt going on vacation   Authorization Type [facility]    PT Start Time 1232    PT Stop Time 1315    PT Time Calculation (min) 43 min    Equipment Utilized During Treatment Gait belt    Activity Tolerance Patient tolerated treatment well    Behavior During Therapy [facility] for tasks assessed/performed                Past Medical History:  Diagnosis Date   Anemia    Anxiety    Arthritis    L HIP   Asthma    Avascular necrosis of hip ([place])    LEFT   Back pain    Chest pain    Constipation    Depression    Diabetes mellitus    Diabetes mellitus, type II (HCC)    Dry cough    Edema, lower extremity    Gait abnormality 11/18/2019   GERD (gastroesophageal reflux disease)    High cholesterol    Hypertension    Insomnia    takes Ambien nightly   Joint pain    Kidney disease    Kidney disease    Obesity    Parkinsonism ([place]) 03/02/2017   PONV (postoperative nausea and vomiting)    RLS (restless legs syndrome) 01/15/2018   Sleep apnea    Swallowing difficulty    Thyroid disease    TIA (transient ischemic attack) 2012   no residual problems   Urgency incontinence    Vitamin D deficiency    Past Surgical History:  Procedure Laterality Date   DILATION AND CURETTAGE OF UTERUS     ESOPHAGEAL MANOMETRY N/A 09/03/2012   Procedure: ESOPHAGEAL MANOMETRY (EM);  Surgeon: Garlan Fair, MD;  Location: [facility];  Service: Endoscopy;  Laterality: N/A;   ESOPHAGEAL MANOMETRY N/A 06/19/2017   Procedure: ESOPHAGEAL MANOMETRY (EM);  Surgeon: Clarene Essex, MD;  Location: [facility];  Service: Endoscopy;   Laterality: N/A;   EXPLORATORY LAPAROTOMY     GANGLION CYST EXCISION     JOINT REPLACEMENT  2011   rt total hip   KNEE ARTHROSCOPY     PILONIDAL CYST / SINUS EXCISION     TOTAL HIP ARTHROPLASTY Left 10/09/2013   Procedure: LEFT TOTAL HIP ARTHROPLASTY ANTERIOR APPROACH;  Surgeon: [name], MD;  Location: [facility];  Service: Orthopedics;  Laterality: Left;   Patient Active Problem List   Diagnosis Date Noted   Allergic conjunctivitis of both eyes 07/06/2020   Gait abnormality 11/18/2019   OSA on CPAP 06/04/2019   Pain in joint of left shoulder 03/28/2019   Seasonal allergic conjunctivitis 02/07/2019   Chronic intermittent hypoxia with obstructive sleep apnea 01/25/2019   Severe obstructive sleep apnea 01/25/2019   Excessive daytime sleepiness 01/03/2019   Non-restorative sleep 01/03/2019   Nocturia more than twice per night 01/03/2019   Weight gain, abnormal 01/03/2019   Snorings 01/03/2019   Trochanteric bursitis of right hip 04/27/2018   Encounter for routine screening for malformation using ultrasonics 02/08/2018   Abnormal weight gain 02/08/2018   Acid indigestion  02/08/2018   Acute antritis 02/08/2018   Class 2 severe obesity with serious comorbidity and body mass index (BMI) of 39.0 to 39.9 in adult ([place]) 02/08/2018   Perennial allergic rhinitis 02/08/2018   Antiphospholipid syndrome ([place]) 02/08/2018   Arthralgia of hip or thigh 02/08/2018   Asthma, cough variant 02/08/2018   Vitamin D deficiency 02/08/2018   Backhand tennis elbow 02/08/2018   Cannot sleep 02/08/2018   Chronic kidney disease (CKD), stage III (moderate) ([place]) 02/08/2018   Current drug use 02/08/2018   Depression, major, single episode, in partial remission ([place]) 02/08/2018   Diabetes mellitus with polyneuropathy ([place]) 02/08/2018   Difficulty hearing 02/08/2018   Disturbance of skin sensation 02/08/2018   Hypertension associated with diabetes ([employer]) 02/08/2018   Extremity pain 02/08/2018   Facet  syndrome, lumbar 02/08/2018   Factor V Leiden ([employer]) 02/08/2018   Fungal infection of nail 02/08/2018   Gastroenteritis and colitis, viral 02/08/2018   Hypoglycemia 02/08/2018   Menopause 02/08/2018   Other long term (current) drug therapy 02/08/2018   Other osteonecrosis, unspecified femur ([place]) 02/08/2018   Pure hypercholesterolemia 02/08/2018   Recurrent major depressive episodes ([place]) 02/08/2018   Syncope and collapse 02/08/2018   Not well controlled moderate persistent asthma 02/08/2018   Cough, persistent 02/08/2018   Restless leg syndrome 01/15/2018   Degeneration of lumbar intervertebral disc 11/21/2017   Tremor of right hand 03/02/2017   Parkinson disease ([place]) 03/02/2017   UTI (urinary tract infection) 12/01/2015   Type 2 diabetes mellitus with stage 3b chronic kidney disease, with long-term current use of insulin ([place]) 06/03/2015   Fast heart beat 04/09/2015   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 2 diabetes mellitus ([place]) 04/24/2014   Postoperative anemia due to acute blood loss 10/10/2013   Avascular necrosis of bone of left hip ([place]) 10/09/2013   Arthritis of pelvic region, degenerative 10/09/2013   Breath shortness 06/18/2013   Diabetes mellitus type 2, uncontrolled 05/31/2013   Intermittent claudication ([place]) 04/04/2013   Anxiety    Gastroesophageal reflux disease 09/12/2012   Clinical depression 02/21/2012   Stroke ([place]) 02/11/2012   History of revision of total replacement of right hip joint 04/04/2009    ONSET DATE: 12/02/2021   REFERRING DIAG: G20 (ICD-10-CM) - Parkinson disease ([place]) R29.6 (ICD-10-CM) - Falls frequently   THERAPY DIAG:  Unsteadiness on feet  Muscle weakness (generalized)  Other abnormalities of gait and mobility  Rationale for Evaluation and Treatment Rehabilitation  SUBJECTIVE:  SUBJECTIVE STATEMENT: Pt reports her legs started cramping on Friday night and have not stopped. Has been unable to sleep well. Is eating bananas and drinking lots of water but nothing has helped. No new falls.   Pt accompanied by: self  PERTINENT HISTORY: Parkinsonism (2018),TIA, thyroid disease, kidney disease, HTN, HLD, GERD, DMII, depression, L hip avascular necrosis, anxiety, anemia, R THA 2011, L THA 2015, knee arthroscopy   PAIN:  Are you having pain? Yes: NPRS scale: 8/10 Pain location: Distal BLEs Pain description: cramps  PRECAUTIONS: Fall, No driving.   FALLS: Has patient fallen in last 6 months? Yes. Number of falls 10  VITALS Vitals:   12/27/21 1238 12/27/21 1241  BP: (!) 142/83 139/73  Pulse: 72 71     PLOF: Independent with community mobility with device  PATIENT GOALS Wants to stop falling, improve balance.   OBJECTIVE:   COGNITION: Overall cognitive status: Impaired  TODAY'S TREATMENT:  Therapeutic Activity  Assessed pt's vitals (see above) due to new onset of cramping in BLEs. Pt concerned over elevated BP, encouraged pt to monitor BP throughout day and write values down.   Therapeutic Exercise SciFit multi-peaks level 4 for 8 minutes using BUE/BLEs for neural priming for reciprocal movement, dynamic cardiovascular warmup and increased amplitude of stepping. Min cues for improved step length and extension of bilateral knees.   Added the following to HEP (see bolded below) for BLE pain management and improved ROM:  - Seated Hamstring Stretch  - Calf stretch at wall   NMR  In // bars for improved UE/LE coordination, stepping strategy and dynamic balance: -Standing on Airex without UE support, alt forward step off airex w/beanbag throw to "cornhole" setup. On first round, had pt throw with any hand she preferred. On second round, challenged pt to throw w/contralateral UE from the foot she stepped with. Pt had significant difficulty  sequencing this initially, but was able to verbally cue herself with each throw. Noted increased difficulty stepping back onto foam w/RLE > LLE. CGA throughout for safety   Pt rated pain as 4/10 following session   GAIT: Gait pattern: decreased arm swing- Left, decreased stride length, Right foot flat, Left foot flat, decreased trunk rotation, trunk flexed, and narrow BOS Distance walked: Clinic distances. Assistive device utilized: Single point cane and None Level of assistance: SBA Comments: Min cues for wider BOS and facilitation of arm swing    PATIENT EDUCATION: Education details: Continue HEP, monitoring BP  Person educated: Patient Education method: Explanation and Handouts Education comprehension: verbalized understanding and needs further education   HOME EXERCISE PROGRAM: Access Code: [email or web address] URL: [email or web address] Date: 12/22/2021 Prepared by: Mickie Bail Mariah Harn  Exercises - Sit to Stand Without Arm Support  - 1 x daily - 7 x weekly - 2 sets - 5-6 reps - Heel Raises with Counter Support  - 1 x daily - 7 x weekly - 3 sets - 10 reps - Step sideways with arms reaching at counter   - 1 x daily - 7 x weekly - 3 sets - 10 reps - Alternating Step Backward with Support  - 1 x daily - 7 x weekly - 3 sets - 10 reps - Standing Quarter Turn with Counter Support  - 1 x daily - 7 x weekly - 3 sets - 10 reps - Seated Hamstring Stretch  - 1 x daily - 7 x weekly - 1 sets - 3-4 reps - 30 second hold - Calf stretch  - 1 x daily - 7  x weekly - 1 sets - 3-4 reps - 30-45 second hold    GOALS: Goals reviewed with patient? Yes  SHORT TERM GOALS: Target date: 01/12/2022  Pt will be independent with initial HEP in order to build upon functional gains made in therapy.  Baseline: Goal status: INITIAL  2.  Pt will improve MiniBest to 19/28 for decreased fall risk and improvement with compensatory stepping strategies.   Baseline: 16/28 Goal status: REVISED  3.  Pt will  improve 5x sit<>stand to less than or equal to 24 sec with no UE support to demonstrate improved functional strength and transfer efficiency.  Baseline: 28.59 seconds with no UE support Goal status: INITIAL  4.  Pt will improve gait speed with LRAD to at least 2.3 ft/sec in order to demo improved community mobility.  Baseline: 16.58 seconds = 1.95 ft/sec with SPC Goal status: INITIAL  5.  Pt will improve manual TUG to 14 seconds or less in order to decr fall risk. Baseline: 16 seconds with SPC  Goal status: INITIAL   LONG TERM GOALS: Target date: 02/09/2022  Pt will be independent with final HEP for PD specific deficits/balance in order to build upon functional gains made in therapy. Baseline:  Goal status: INITIAL  2.  Pt will improve MiniBest to 23/28 for decreased fall risk and improvement with compensatory stepping strategies.   Baseline: 16/28 Goal status: REVISED  3.  Pt will improve gait speed with LRAD to at least 2.6 ft/sec in order to demo improved community mobility.  Baseline: 16.58 seconds = 1.95 ft/sec with SPC Goal status: INITIAL  4.  Pt will improve 5x sit<>stand to less than or equal to 18 sec with no UE support to demonstrate improved functional strength and transfer efficiency.  Baseline: 28.59 seconds with no UE support Goal status: INITIAL  5.  Pt will verbalize understanding of local Parkinson's disease resources, including options for continued community fitness. Baseline:  Goal status: INITIAL    ASSESSMENT:  CLINICAL IMPRESSION: Session limited due to pt's high pain levels. Emphasis of skilled PT session on endurance, reciprocal coordination and stepping strategies. Pt reported decrease in pain by end of session w/addition of stretching and cardiovascular warmup. Pt continues to require min-mod cues for proper reciprocal sequencing and increased step length. Continue POC.     OBJECTIVE IMPAIRMENTS Abnormal gait, decreased activity tolerance,  decreased balance, decreased coordination, difficulty walking, decreased strength, impaired flexibility, impaired tone, and postural dysfunction.   ACTIVITY LIMITATIONS bending, squatting, stairs, transfers, and locomotion level  PARTICIPATION LIMITATIONS: driving and community activity  PERSONAL FACTORS Behavior pattern, Past/current experiences, Time since onset of injury/illness/exacerbation, and 3+ comorbidities: Parkinsonism (2018),TIA, thyroid disease, kidney disease, HTN, HLD, GERD, DMII, depression, L hip avascular necrosis, anxiety, anemia, R THA 2011, L THA 2015, knee arthroscopy   are also affecting patient's functional outcome.   REHAB POTENTIAL: Good  CLINICAL DECISION MAKING: Evolving/moderate complexity  EVALUATION COMPLEXITY: Moderate  PLAN: PT FREQUENCY: 2x/week  PT DURATION: 12 weeks  PLANNED INTERVENTIONS: Therapeutic exercises, Therapeutic activity, Neuromuscular re-education, Balance training, Gait training, Patient/Family education, Self Care, Stair training, Vestibular training, DME instructions, Manual therapy, and Re-evaluation  PLAN FOR NEXT SESSION: Add to HEP prn for balance - esp stepping strategies, functional strength. Try PWR moves - either standing/mod quadruped. Sit <>stands on wedge, scifit for endurance,forward lunge over beam, reaching out of BOS, resisted walking, sit <>stands w/slam ball, treadmill?  Cruzita Lederer Toan Mort, PT, DPT  12/27/2021, 1:16 PM

## 2021-12-27 NOTE — Progress Notes (Signed)
Chief Complaint:   OBESITY Deanna Rowe is here to discuss her progress with her obesity treatment plan along with follow-up of her obesity related diagnoses. [name] is on the Category 3 Plan and states she is following her eating plan approximately 50% of the time. [name] states she is exercising 0 minutes 0 times per week.  Today's visit was #: 53 Starting weight: 237 lbs Starting date: 04/16/2019 Today's weight: 208 lbs Today's date: 12/23/2021 Total lbs lost to date: 29 lbs Total lbs lost since last in-office visit: 0  Interim History: [name] is taking Wegovy 2.4 mg. Denies any side effects. Prescribed by Dr. Buddy Duty. Denies hunger or cravings.  Not skipping meals. She is going to [place] in Oct. Drinking water and occassionally soda. Going to PT/OT 3 times per week. No falls since her last visit. Walking with the aid of a cane. Saw Neurology last 11/04/21.  Subjective:   1. Type 2 diabetes mellitus with stage 3b chronic kidney disease, with long-term current use of insulin (HCC) Deanna Rowe saw Dr. Buddy Duty 2 weeks ago. A1c was 6.4 (patient reported). Fasting blood sugar: 70-120. One episode of blood sugar 40 ( did not eat enough dinner). She is not using a sliding scale. On ARB+ statin. Has an eye exam scheduled.  Assessment/Plan:   1. Type 2 diabetes mellitus with stage 3b chronic kidney disease, with long-term current use of insulin (HCC) Continue to follow up with Dr. Buddy Duty.  Good blood sugar control is important to decrease the likelihood of diabetic complications such as nephropathy, neuropathy, limb loss, blindness, coronary artery disease, and death. Intensive lifestyle modification including diet, exercise and weight loss are the first line of treatment for diabetes.    2. Obesity: current  BMI 33.6 [name] is currently in the action stage of change. As such, her goal is to continue with weight loss efforts. She has agreed to the Category 3 Plan.   Exercise goals: As  is.  Behavioral modification strategies: increasing water intake, no skipping meals, and planning for success.  [name] has agreed to follow-up with our clinic in 3 weeks. She was informed of the importance of frequent follow-up visits to maximize her success with intensive lifestyle modifications for her multiple health conditions.   Objective:   Blood pressure 102/66, pulse 69, temperature 98.2 F (36.8 C), height '5\' 6"'$  (1.676 m), weight 208 lb (94.3 kg), SpO2 97 %. Body mass index is 33.57 kg/m.  General: Cooperative, alert, well developed, in no acute distress. HEENT: Conjunctivae and lids unremarkable. Cardiovascular: Regular rhythm.  Lungs: Normal work of breathing. Neurologic: No focal deficits.   Lab Results  Component Value Date   CREATININE 1.43 (H) 03/02/2020   BUN 13 03/02/2020   NA 136 03/02/2020   K 4.7 03/02/2020   CL 98 03/02/2020   CO2 27 03/02/2020   Lab Results  Component Value Date   ALT 21 04/16/2019   AST 23 04/16/2019   ALKPHOS 58 04/16/2019   BILITOT 0.3 04/16/2019   Lab Results  Component Value Date   HGBA1C 7.9 (H) 04/16/2019   HGBA1C 6.9 11/25/2016   HGBA1C 6.0 06/10/2016   HGBA1C 5.9 02/12/2016   HGBA1C 6.1 12/01/2015   Lab Results  Component Value Date   INSULIN 12.3 04/16/2019   Lab Results  Component Value Date   TSH 1.780 04/16/2019   Lab Results  Component Value Date   CHOL 155 04/16/2019   HDL 84 04/16/2019   LDLCALC 57 04/16/2019   TRIG  71 04/16/2019   Lab Results  Component Value Date   VD25OH 44.8 12/17/2019   VD25OH 40.9 04/16/2019   Lab Results  Component Value Date   WBC 6.8 04/16/2019   HGB 12.3 04/16/2019   HCT 36.1 04/16/2019   MCV 96 04/16/2019   PLT 274 04/16/2019   No results found for: "IRON", "TIBC", "FERRITIN"  Attestation Statements:   Reviewed by clinician on day of visit: allergies, medications, problem list, medical history, surgical history, family history, social history, and previous  encounter notes.  I spent 30 minutes with the patient today and reviewing her chart before and after her visit.  I, Brendell Tyus, RMA, am acting as transcriptionist for Everardo Pacific, FNP.  I have reviewed the above documentation for accuracy and completeness, and I agree with the above. Everardo Pacific, FNP

## 2021-12-27 NOTE — Therapy (Signed)
OUTPATIENT OCCUPATIONAL THERAPY [facility]   Patient Name: Deanna Rowe MRN: [id number] DOB:1955/10/08, 66 y.o., female Today's Date: 12/27/2021  PCP: Dr. Radene Ou REFERRING PROVIDER: Ward Givens NP   OT End of Session - 12/27/21 1323     Visit Number 2    Number of Visits 25    Date for OT Re-Evaluation 03/17/22    Authorization Type [facility]    Authorization - Visit Number 2    Authorization - Number of Visits 12   need auth after 25 th visit   OT Start Time 1320    OT Stop Time 1400    OT Time Calculation (min) 40 min    Activity Tolerance Patient tolerated treatment well    Behavior During Therapy [facility] for tasks assessed/performed             Past Medical History:  Diagnosis Date   Anemia    Anxiety    Arthritis    L HIP   Asthma    Avascular necrosis of hip ([place])    LEFT   Back pain    Chest pain    Constipation    Depression    Diabetes mellitus    Diabetes mellitus, type II ([place])    Dry cough    Edema, lower extremity    Gait abnormality 11/18/2019   GERD (gastroesophageal reflux disease)    High cholesterol    Hypertension    Insomnia    takes Ambien nightly   Joint pain    Kidney disease    Kidney disease    Obesity    Parkinsonism ([place]) 03/02/2017   PONV (postoperative nausea and vomiting)    RLS (restless legs syndrome) 01/15/2018   Sleep apnea    Swallowing difficulty    Thyroid disease    TIA (transient ischemic attack) 2012   no residual problems   Urgency incontinence    Vitamin D deficiency    Past Surgical History:  Procedure Laterality Date   DILATION AND CURETTAGE OF UTERUS     ESOPHAGEAL MANOMETRY N/A 09/03/2012   Procedure: ESOPHAGEAL MANOMETRY (EM);  Surgeon: Garlan Fair, MD;  Location: [facility];  Service: Endoscopy;  Laterality: N/A;   ESOPHAGEAL MANOMETRY N/A 06/19/2017   Procedure: ESOPHAGEAL MANOMETRY (EM);  Surgeon: Clarene Essex, MD;  Location: [facility];  Service: Endoscopy;  Laterality: N/A;    EXPLORATORY LAPAROTOMY     GANGLION CYST EXCISION     JOINT REPLACEMENT  2011   rt total hip   KNEE ARTHROSCOPY     PILONIDAL CYST / SINUS EXCISION     TOTAL HIP ARTHROPLASTY Left 10/09/2013   Procedure: LEFT TOTAL HIP ARTHROPLASTY ANTERIOR APPROACH;  Surgeon: [name], MD;  Location: [facility];  Service: Orthopedics;  Laterality: Left;   Patient Active Problem List   Diagnosis Date Noted   Allergic conjunctivitis of both eyes 07/06/2020   Gait abnormality 11/18/2019   OSA on CPAP 06/04/2019   Pain in joint of left shoulder 03/28/2019   Seasonal allergic conjunctivitis 02/07/2019   Chronic intermittent hypoxia with obstructive sleep apnea 01/25/2019   Severe obstructive sleep apnea 01/25/2019   Excessive daytime sleepiness 01/03/2019   Non-restorative sleep 01/03/2019   Nocturia more than twice per night 01/03/2019   Weight gain, abnormal 01/03/2019   Snorings 01/03/2019   Trochanteric bursitis of right hip 04/27/2018   Encounter for routine screening for malformation using ultrasonics 02/08/2018   Abnormal weight gain 02/08/2018   Acid indigestion 02/08/2018  Acute antritis 02/08/2018   Class 2 severe obesity with serious comorbidity and body mass index (BMI) of 39.0 to 39.9 in adult [facility]) 02/08/2018   Perennial allergic rhinitis 02/08/2018   Antiphospholipid syndrome ([place]) 02/08/2018   Arthralgia of hip or thigh 02/08/2018   Asthma, cough variant 02/08/2018   Vitamin D deficiency 02/08/2018   Backhand tennis elbow 02/08/2018   Cannot sleep 02/08/2018   Chronic kidney disease (CKD), stage III (moderate) ([place]) 02/08/2018   Current drug use 02/08/2018   Depression, major, single episode, in partial remission ([place]) 02/08/2018   Diabetes mellitus with polyneuropathy ([place]) 02/08/2018   Difficulty hearing 02/08/2018   Disturbance of skin sensation 02/08/2018   Hypertension associated with diabetes ([place]) 02/08/2018   Extremity pain 02/08/2018   Facet syndrome, lumbar  02/08/2018   Factor V Leiden ([place]) 02/08/2018   Fungal infection of nail 02/08/2018   Gastroenteritis and colitis, viral 02/08/2018   Hypoglycemia 02/08/2018   Menopause 02/08/2018   Other long term (current) drug therapy 02/08/2018   Other osteonecrosis, unspecified femur ([place]) 02/08/2018   Pure hypercholesterolemia 02/08/2018   Recurrent major depressive episodes ([place]) 02/08/2018   Syncope and collapse 02/08/2018   Not well controlled moderate persistent asthma 02/08/2018   Cough, persistent 02/08/2018   Restless leg syndrome 01/15/2018   Degeneration of lumbar intervertebral disc 11/21/2017   Tremor of right hand 03/02/2017   Parkinson disease ([place]) 03/02/2017   UTI (urinary tract infection) 12/01/2015   Type 2 diabetes mellitus with stage 3b chronic kidney disease, with long-term current use of insulin ([place]) 06/03/2015   Fast heart beat 04/09/2015   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 2 diabetes mellitus ([place]) 04/24/2014   Postoperative anemia due to acute blood loss 10/10/2013   Avascular necrosis of bone of left hip ([place]) 10/09/2013   Arthritis of pelvic region, degenerative 10/09/2013   Breath shortness 06/18/2013   Diabetes mellitus type 2, uncontrolled 05/31/2013   Intermittent claudication ([place]) 04/04/2013   Anxiety    Gastroesophageal reflux disease 09/12/2012   Clinical depression 02/21/2012   Stroke ([place]) 02/11/2012   History of revision of total replacement of right hip joint 04/04/2009    ONSET DATE: 12/15/21  REFERRING DIAG: Parkinsonism  THERAPY DIAG:  Other symptoms and signs involving the nervous system  Other lack of coordination  Rationale for Evaluation and Treatment Rehabilitation  SUBJECTIVE:   SUBJECTIVE STATEMENT: Pt would like to be able to style hair better Pt accompanied by: self  PERTINENT HISTORY: PERTINENT HISTORY: Parkinsonism (2018),TIA, thyroid disease, kidney disease, HTN, HLD, GERD, DMII, depression, L hip avascular necrosis, anxiety, anemia,  R THA 2011, L THA 2015, knee arthroscopy   PRECAUTIONS: Fall  WEIGHT BEARING RESTRICTIONS No  PAIN:  Are you having pain? No  FALLS: Has patient fallen in last 6 months? Yes. Number of falls 5  LIVING ENVIRONMENT: Lives with: lives with their spouse Lives in: House/apartment  PLOF: Independent  PATIENT GOALS to be able style hair  OBJECTIVE:   HAND DOMINANCE: Right   FUNCTIONAL OUTCOME MEASURES: Fastening/unfastening 3 buttons: 50.68 Physical performance test: PPT#2 (simulated eating) 15.03 & PPT#4 (donning/doffing jacket): 13.34  COORDINATION: 9 Hole Peg test: Right: 36.91 sec; Left: 38.16 sec Box and Blocks:  Right 33blocks, Left 37blocks   TODAY'S TREATMENT:  Pt issued PWR! Moves seated (basic 4)  - pt demo each x 20 w/ mod cues for large amplitude movements. Cues to look at hand for PWR! Rock. Pt had difficulty w/ PWR! Step  Pt issued handwriting strategies -  pt 100% legible but does report fatigue and handwriting getting smaller as she goes  Pt issued coordination HEP - will need further review and additional ex's for coordination    HOME EXERCISE PROGRAM: 12/27/21: PWR! Seated, coordination HEP, writing strategies  ASSESSMENT:  CLINICAL IMPRESSION: Patient fatigues quickly and requires cues for posture (anterior pelvic tilt). Pt responds well to HEP w/ cueing  PERFORMANCE DEFICITS in functional skills including ADLs, IADLs, coordination, dexterity, proprioception, sensation, tone, ROM, strength, pain, flexibility, FMC, GMC, mobility, balance, endurance, decreased knowledge of precautions, decreased knowledge of use of DME, and UE functional use, bradykinesia , cognitive skills including attention, energy/drive, learn, memory, problem solving, safety awareness, thought, and understand, and psychosocial skills including coping strategies, environmental adaptation, habits, interpersonal interactions, and routines and behaviors.   IMPAIRMENTS are limiting patient  from ADLs, IADLs, play, leisure, and social participation.   COMORBIDITIES may have co-morbidities  that affects occupational performance. Patient will benefit from skilled OT to address above impairments and improve overall function.  MODIFICATION OR ASSISTANCE TO COMPLETE EVALUATION: No modification of tasks or assist necessary to complete an evaluation.  OT OCCUPATIONAL PROFILE AND HISTORY: Detailed assessment: Review of records and additional review of physical, cognitive, psychosocial history related to current functional performance.  CLINICAL DECISION MAKING: LOW - limited treatment options, no task modification necessary  REHAB POTENTIAL: Good  EVALUATION COMPLEXITY: Low     GOALS: Goals reviewed with patient? No  SHORT TERM GOALS: Target date: 01/20/22   I with PD specific HEP Baseline: Goal status: INITIAL  2.  Pt will verbalize understanding of adapted strategies to maximize safety and I with ADLs/ IADLs .  Baseline:  Goal status: INITIAL  3.  Pt will demonstrate improved bilateral UE functional use as evidenced by increasing box/ blocks score by 3 blocks Baseline: RUE 33, LUE 37 Goal status: INITIAL  4.  Pt will demonstrate improved ease with feeding as evidenced by decreasing PPT#2 (self feeding) by 3 secs Baseline: 15.03 Goal status: INITIAL  5.  Pt will report increased ease with styling her hair Baseline: Pt reports difficulty Goal status: INITIAL    LONG TERM GOALS: Target date: 03/17/22- Pt is currently scheduled 1 week outdiPOC  Pt will verbalize understanding of ways to prevent future PD related complications and PD community resources.  Baseline:  Goal status: INITIAL  2.  Pt will demonstrate improved fine motor coordination for ADLs as evidenced by decreasing bilateral  9 hole peg test score for by 3 secs  Baseline: RUE 36.91, LUE 38.16 Goal status: INITIAL  3.  Pt will demonstrate understanding of memory compensations and ways to keep  thinking skills sharp  Baseline:  Goal status: INITIAL  4.  Pt will demonstrate improved ease with fastening buttons as evidenced by decreasing 3 button/ unbutton time to 47 secs or less  Baseline: 50.68 Goal status: INITIAL   PLAN: OT FREQUENCY: 2x/week  OT DURATION: 12 weeks POC written for 12 weeks to account for scheduling  PLANNED INTERVENTIONS: self care/ADL training, therapeutic exercise, therapeutic activity, neuromuscular re-education, manual therapy, passive range of motion, balance training, stair training, functional mobility training, aquatic therapy, ultrasound, paraffin, fluidotherapy, moist heat, cryotherapy, contrast bath, patient/family education, cognitive remediation/compensation, visual/perceptual remediation/compensation, energy conservation, coping strategies training, DME and/or AE instructions, and Re-evaluation  RECOMMENDED OTHER SERVICES: PT  CONSULTED AND AGREED WITH PLAN OF CARE: Patient  PLAN FOR NEXT SESSION: review PWR! Seated and coordination HEP - add to coordination prn, PWR! hands   Hans Eden,  OT 12/27/2021, 1:23 PM

## 2021-12-27 NOTE — Patient Instructions (Addendum)
             Coordination Exercises  Perform the following exercises for 15 minutes 1-2 times per day. Perform with both hand(s). Perform using big movements.  Flipping Cards: Place deck of cards on the table. Flip cards over by opening your hand big to grasp and then turn your palm up big. Hold deck of cards in hand and push top card off with thumb. Do one big push! Rotate ball with fingertips: Pick up with fingers/thumb and move as much as you can with each turn/movement (clockwise and counter-clockwise). Toss ball in the air and catch with the same hand: Toss big/high. Pick up coins and stack one at a time: Pick up with big, intentional movements. Do not drag coin to the edge. (5-10 in a stack) Practice writing: Slow down, write big, and focus on forming each letter. Perform "Flicks"/hand stretches (PWR! Hands): Close hands then flick out your fingers with focus on opening hands, pulling wrists back, and extending elbows like you are pushing.

## 2021-12-29 ENCOUNTER — Ambulatory Visit: Payer: 59 | Attending: Family Medicine | Admitting: Pharmacist

## 2021-12-29 ENCOUNTER — Other Ambulatory Visit (HOSPITAL_BASED_OUTPATIENT_CLINIC_OR_DEPARTMENT_OTHER): Payer: Self-pay

## 2021-12-29 DIAGNOSIS — Z79899 Other long term (current) drug therapy: Secondary | ICD-10-CM

## 2021-12-29 NOTE — Progress Notes (Signed)
S: Patient presents for review of their specialty medication therapy.  Patient is currently taking Xolair for asthma. Patient is managed by NP Althea Charon for this.   Adherence: confirmed.   Efficacy: reports that Xolair is working well for her.   Dosing: Give subcutaneously.  Can be dosed every 2 or 4 weeks based on baseline serum IgE levels and body weight.  Asthma: SubQ: Dose and frequency based on body weight and pretreatment total IgE serum levels. Dosing should be adjusted during therapy for significant changes in body weight. Dosing should not be adjusted based on total IgE levels taken during treatment or <1 year following interruption of therapy. If therapy has been interrupted for ?1 year, total IgE levels may be re-evaluated for dosage determination.  Pretreatment serum IgE ?30 to 100 units/mL:   >90 to 150 kg: 300 mg every 4 weeks  Dose adjustments: Renal: no dose adjustments  Hepatic: no dose adjustments  Toxicity: Severe hypersensitivity reaction or anaphylaxis: Discontinue treatment. Fever, arthralgia, and rash: Discontinue treatment if this constellation of symptoms occurs.  Drug-drug interactions: none identified   Monitoring: CV effects: none Eosinophilia and vasculitis: none Fever/arthralgia/rash: none Hypersensitivity/Anaphylaxis: none Malignant neoplasms: none Pulmonary function tests (for asthma): none  O: Lab Results  Component Value Date   WBC 6.8 04/16/2019   HGB 12.3 04/16/2019   HCT 36.1 04/16/2019   MCV 96 04/16/2019   PLT 274 04/16/2019      Chemistry      Component Value Date/Time   NA 136 03/02/2020 0920   NA 142 01/12/2017 0914   K 4.7 03/02/2020 0920   K 4.2 01/12/2017 0914   CL 98 03/02/2020 0920   CO2 27 03/02/2020 0920   CO2 28 01/12/2017 0914   BUN 13 03/02/2020 0920   BUN 12.0 01/12/2017 0914   CREATININE 1.43 (H) 03/02/2020 0920   CREATININE 1.58 (H) 10/12/2018 1121   CREATININE 1.3 (H) 01/12/2017 0914       Component Value Date/Time   CALCIUM 9.4 03/02/2020 0920   CALCIUM 9.6 01/12/2017 0914   ALKPHOS 58 04/16/2019 1451   ALKPHOS 43 01/12/2017 0914   AST 23 04/16/2019 1451   AST 16 10/12/2018 1121   AST 19 01/12/2017 0914   ALT 21 04/16/2019 1451   ALT <6 10/12/2018 1121   ALT 18 01/12/2017 0914   BILITOT 0.3 04/16/2019 1451   BILITOT 0.2 (L) 10/12/2018 1121   BILITOT 0.28 01/12/2017 0914     A/P: 1. Medication review: patient currently on Xolair for asthma. Reviewed the medication with the patient, including the following: Xolair, omalizumab, is a novel IgE blocker.  It appears to reduce rates of hospitalizations, ER visits and unscheduled physician visits due asthma exacerbations when added to standard therapy.  Studies also show a reduction in steroid requirements and improvement in quality of life.  Patient educated on purpose, proper use and potential adverse effects of Xolair.  Following instruction patient verbalized understanding. Patient should always have an EpiPen readily available in the event of anaphylaxis. SubQ: For SubQ injection only; doses >150 mg should be divided over more than one injection site (eg, 225 mg or 300 mg administered as two injections, 375 mg administered as three injections); each injection site should be separated by ?1 inch. Do not inject into moles, scars, bruises, tender areas, or broken skin. Injections may take 5 to 10 seconds to administer (solution is slightly viscous). Administer only under direct medical supervision and observe patient for 2 hours after  the first 3 injections and 30 minutes after subsequent injections Dellia Cloud 2015) or in accordance with individual institution policies and procedures.No recommendations for any changes at this time.   Benard Halsted, PharmD, [place], [place] [phone]

## 2021-12-30 ENCOUNTER — Encounter: Payer: Self-pay | Admitting: Physical Therapy

## 2021-12-30 ENCOUNTER — Ambulatory Visit: Payer: 59 | Admitting: Occupational Therapy

## 2021-12-30 ENCOUNTER — Ambulatory Visit: Payer: 59 | Admitting: Physical Therapy

## 2021-12-30 ENCOUNTER — Encounter: Payer: Self-pay | Admitting: Occupational Therapy

## 2021-12-30 DIAGNOSIS — M6281 Muscle weakness (generalized): Secondary | ICD-10-CM

## 2021-12-30 DIAGNOSIS — R2681 Unsteadiness on feet: Secondary | ICD-10-CM

## 2021-12-30 DIAGNOSIS — R296 Repeated falls: Secondary | ICD-10-CM | POA: Diagnosis not present

## 2021-12-30 DIAGNOSIS — R41840 Attention and concentration deficit: Secondary | ICD-10-CM

## 2021-12-30 DIAGNOSIS — R41844 Frontal lobe and executive function deficit: Secondary | ICD-10-CM | POA: Diagnosis not present

## 2021-12-30 DIAGNOSIS — R29818 Other symptoms and signs involving the nervous system: Secondary | ICD-10-CM | POA: Diagnosis not present

## 2021-12-30 DIAGNOSIS — R2689 Other abnormalities of gait and mobility: Secondary | ICD-10-CM | POA: Diagnosis not present

## 2021-12-30 DIAGNOSIS — R278 Other lack of coordination: Secondary | ICD-10-CM

## 2021-12-30 DIAGNOSIS — G20 Parkinson's disease: Secondary | ICD-10-CM | POA: Diagnosis not present

## 2021-12-30 NOTE — Patient Instructions (Signed)
  PWR! Hands  With arms stretched out in front of you (elbows straight), perform the following: PWR! Rock: Move wrists up and down [employer]! Twist: Twist palms up and down BIG PWR! Hands: Push hands out BIG. Elbows straight, wrists up, fingers open and spread apart BIG. (Can also perform by pushing down on table, chair, knees. Push above head, out to the side, behind you, in front of you.)  Then, start with elbows bent and hands closed. PWR! Up: Close hands and flick fingers open and apart BIG   ** Make each movement big and deliberate so that you feel the movement.  Perform at least 10 repetitions 1x/day, but perform PWR! hands throughout the day when you are having trouble using your hands (picking up/manipulating small objects, writing, eating, typing, sewing, buttoning, etc.).

## 2021-12-30 NOTE — Therapy (Signed)
OUTPATIENT PHYSICAL THERAPY NEURO TREATMENT   Patient Name: Deanna Rowe MRN: [id number] DOB:1955-06-27, 66 y.o., female Today's Date: 12/30/2021   PCP: Aretta Nip, MD REFERRING PROVIDER: Ward Givens, NP    PT End of Session - 12/30/21 1151     Visit Number 5    Number of Visits 17    Date for PT Re-Evaluation 03/15/22   due to delay in scheduling/pt going on vacation   Authorization Type [employer]    PT Start Time 1150    PT Stop Time 1230    PT Time Calculation (min) 40 min    Equipment Utilized During Treatment Gait belt    Activity Tolerance Patient tolerated treatment well    Behavior During Therapy [facility] for tasks assessed/performed                Past Medical History:  Diagnosis Date   Anemia    Anxiety    Arthritis    L HIP   Asthma    Avascular necrosis of hip ([place])    LEFT   Back pain    Chest pain    Constipation    Depression    Diabetes mellitus    Diabetes mellitus, type II (HCC)    Dry cough    Edema, lower extremity    Gait abnormality 11/18/2019   GERD (gastroesophageal reflux disease)    High cholesterol    Hypertension    Insomnia    takes Ambien nightly   Joint pain    Kidney disease    Kidney disease    Obesity    Parkinsonism ([name]) 03/02/2017   PONV (postoperative nausea and vomiting)    RLS (restless legs syndrome) 01/15/2018   Sleep apnea    Swallowing difficulty    Thyroid disease    TIA (transient ischemic attack) 2012   no residual problems   Urgency incontinence    Vitamin D deficiency    Past Surgical History:  Procedure Laterality Date   DILATION AND CURETTAGE OF UTERUS     ESOPHAGEAL MANOMETRY N/A 09/03/2012   Procedure: ESOPHAGEAL MANOMETRY (EM);  Surgeon: Garlan Fair, MD;  Location: [facility];  Service: Endoscopy;  Laterality: N/A;   ESOPHAGEAL MANOMETRY N/A 06/19/2017   Procedure: ESOPHAGEAL MANOMETRY (EM);  Surgeon: Clarene Essex, MD;  Location: [facility];  Service: Endoscopy;   Laterality: N/A;   EXPLORATORY LAPAROTOMY     GANGLION CYST EXCISION     JOINT REPLACEMENT  2011   rt total hip   KNEE ARTHROSCOPY     PILONIDAL CYST / SINUS EXCISION     TOTAL HIP ARTHROPLASTY Left 10/09/2013   Procedure: LEFT TOTAL HIP ARTHROPLASTY ANTERIOR APPROACH;  Surgeon: [name], MD;  Location: [facility];  Service: Orthopedics;  Laterality: Left;   Patient Active Problem List   Diagnosis Date Noted   Allergic conjunctivitis of both eyes 07/06/2020   Gait abnormality 11/18/2019   OSA on CPAP 06/04/2019   Pain in joint of left shoulder 03/28/2019   Seasonal allergic conjunctivitis 02/07/2019   Chronic intermittent hypoxia with obstructive sleep apnea 01/25/2019   Severe obstructive sleep apnea 01/25/2019   Excessive daytime sleepiness 01/03/2019   Non-restorative sleep 01/03/2019   Nocturia more than twice per night 01/03/2019   Weight gain, abnormal 01/03/2019   Snorings 01/03/2019   Trochanteric bursitis of right hip 04/27/2018   Encounter for routine screening for malformation using ultrasonics 02/08/2018   Abnormal weight gain 02/08/2018   Acid indigestion  02/08/2018   Acute antritis 02/08/2018   Class 2 severe obesity with serious comorbidity and body mass index (BMI) of 39.0 to 39.9 in adult ([place]) 02/08/2018   Perennial allergic rhinitis 02/08/2018   Antiphospholipid syndrome ([place]) 02/08/2018   Arthralgia of hip or thigh 02/08/2018   Asthma, cough variant 02/08/2018   Vitamin D deficiency 02/08/2018   Backhand tennis elbow 02/08/2018   Cannot sleep 02/08/2018   Chronic kidney disease (CKD), stage III (moderate) ([place]) 02/08/2018   Current drug use 02/08/2018   Depression, major, single episode, in partial remission ([place]) 02/08/2018   Diabetes mellitus with polyneuropathy ([place]) 02/08/2018   Difficulty hearing 02/08/2018   Disturbance of skin sensation 02/08/2018   Hypertension associated with diabetes ([place]) 02/08/2018   Extremity pain 02/08/2018   Facet  syndrome, lumbar 02/08/2018   Factor V Leiden ([place]) 02/08/2018   Fungal infection of nail 02/08/2018   Gastroenteritis and colitis, viral 02/08/2018   Hypoglycemia 02/08/2018   Menopause 02/08/2018   Other long term (current) drug therapy 02/08/2018   Other osteonecrosis, unspecified femur ([place]) 02/08/2018   Pure hypercholesterolemia 02/08/2018   Recurrent major depressive episodes ([place]) 02/08/2018   Syncope and collapse 02/08/2018   Not well controlled moderate persistent asthma 02/08/2018   Cough, persistent 02/08/2018   Restless leg syndrome 01/15/2018   Degeneration of lumbar intervertebral disc 11/21/2017   Tremor of right hand 03/02/2017   Parkinson disease ([place]) 03/02/2017   UTI (urinary tract infection) 12/01/2015   Type 2 diabetes mellitus with stage 3b chronic kidney disease, with long-term current use of insulin ([place]) 06/03/2015   Fast heart beat 04/09/2015   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 2 diabetes mellitus ([place]) 04/24/2014   Postoperative anemia due to acute blood loss 10/10/2013   Avascular necrosis of bone of left hip ([place]) 10/09/2013   Arthritis of pelvic region, degenerative 10/09/2013   Breath shortness 06/18/2013   Diabetes mellitus type 2, uncontrolled 05/31/2013   Intermittent claudication ([place]) 04/04/2013   Anxiety    Gastroesophageal reflux disease 09/12/2012   Clinical depression 02/21/2012   Stroke ([place]) 02/11/2012   History of revision of total replacement of right hip joint 04/04/2009    ONSET DATE: 12/02/2021   REFERRING DIAG: G20 (ICD-10-CM) - Parkinson disease ([place]) R29.6 (ICD-10-CM) - Falls frequently   THERAPY DIAG:  Other symptoms and signs involving the nervous system  Unsteadiness on feet  Muscle weakness (generalized)  Other abnormalities of gait and mobility  Rationale for Evaluation and Treatment Rehabilitation  SUBJECTIVE:  SUBJECTIVE STATEMENT: Was trying to dust on Tuesday, fell backwards. Was leaning forward to dust table.  Hit her head a little bit (not symptomatic). Got a bruise on her L arm. Has a couple bruises in her legs. Was able to get up on her own after she fell with no issues.  Reports cramps in legs are better.   Pt accompanied by: self  PERTINENT HISTORY: Parkinsonism (2018),TIA, thyroid disease, kidney disease, HTN, HLD, GERD, DMII, depression, L hip avascular necrosis, anxiety, anemia, R THA 2011, L THA 2015, knee arthroscopy   PAIN:  Are you having pain? No  PRECAUTIONS: Fall, No driving.   FALLS: Has patient fallen in last 6 months? Yes. Number of falls 10  VITALS There were no vitals filed for this visit.    PLOF: Independent with community mobility with device  PATIENT GOALS Wants to stop falling, improve balance.    OBJECTIVE:   COGNITION: Overall cognitive status: Impaired  TODAY'S TREATMENT:   NMR  Pt performs PWR! Moves in Standing position x 10 reps - with chair in front for safety as needed.    PWR! Up for improved posture - verbal/tactile/manual cues for scap retraction, cues to lower shoulders and open hands   PWR! Rock for improved weighshifting, cues to rock through midline and look up at hands, cues for posture throughout midline   PWR! Twist for improved trunk rotation - cues to use legs to help with twisting and to reset with tall posture in the midline   PWR! Step for improved step initiation - cues for incr foot clearance when stepping.   Cues provided for wider BOS, technique, larger amplitude movement patterns, and how each movement relates to function. Cues for effort as a 7/10. Pt has already performed PWR Step in HEP, gave pt remainder of standing PWR moves for HEP (making sure that she has a chair in front of her when doing for balance at home).  Pt had a fall at home and  fell backwards the other day while cleaning. Discussed/demonstrated and had pt perform having a wide BOS and staggered stance to assist with balance when doing tasks in standing and to use UE support as needed. Pt able to demo understanding.   With 4 smaller orange obstacles, alternating step overs for SLS/incr foot clearance, cues for slowed pace, down and back x4 reps, pt needing to use intermittent UE support as needed for balance.  With 6" step using BUE support, forward step ups with contralateral leg march x8 reps each side, pt reporting fatigue in legs afterwards and needing a standing break. Cues for sequencing throughout and for incr ROM when marching.  Retro gait in // bars, down and back x4 reps, cued for incr step length and wider BOS, beginning with 16 steps > 10 > 8 steps On air ex, alternating forward step overs over 4" obstacle, x10 reps each leg, intermittent UE support, cues for foot clearance and weight shifting, pt needs cues for sequencing throughout.    GAIT: Gait pattern: decreased arm swing- Left, decreased stride length, Right foot flat, Left foot flat, decreased trunk rotation, trunk flexed, and narrow BOS Distance walked: Clinic distances. Assistive device utilized: Single point cane and None Level of assistance: SBA Comments: Min cues for wider BOS and facilitation of arm swing    PATIENT EDUCATION: Education details: Standing PWR moves to [employer].  Person educated: Patient Education method: Explanation and Handouts Education comprehension: verbalized understanding and needs further education   HOME EXERCISE  PROGRAM: Access Code: [email or web address] URL: [email or web address] Date: 12/22/2021 Prepared by: Mickie Bail Plaster  Exercises - Sit to Stand Without Arm Support  - 1 x daily - 7 x weekly - 2 sets - 5-6 reps - Heel Raises with Counter Support  - 1 x daily - 7 x weekly - 3 sets - 10 reps - Step sideways with arms reaching at counter   - 1 x daily - 7 x  weekly - 3 sets - 10 reps - Alternating Step Backward with Support  - 1 x daily - 7 x weekly - 3 sets - 10 reps - Standing Quarter Turn with Counter Support  - 1 x daily - 7 x weekly - 3 sets - 10 reps - Seated Hamstring Stretch  - 1 x daily - 7 x weekly - 1 sets - 3-4 reps - 30 second hold - Calf stretch  - 1 x daily - 7 x weekly - 1 sets - 3-4 reps - 30-45 second hold  Standing PWR moves    GOALS: Goals reviewed with patient? Yes  SHORT TERM GOALS: Target date: 01/12/2022  Pt will be independent with initial HEP in order to build upon functional gains made in therapy.  Baseline: Goal status: INITIAL  2.  Pt will improve MiniBest to 19/28 for decreased fall risk and improvement with compensatory stepping strategies.   Baseline: 16/28 Goal status: REVISED  3.  Pt will improve 5x sit<>stand to less than or equal to 24 sec with no UE support to demonstrate improved functional strength and transfer efficiency.  Baseline: 28.59 seconds with no UE support Goal status: INITIAL  4.  Pt will improve gait speed with LRAD to at least 2.3 ft/sec in order to demo improved community mobility.  Baseline: 16.58 seconds = 1.95 ft/sec with SPC Goal status: INITIAL  5.  Pt will improve manual TUG to 14 seconds or less in order to decr fall risk. Baseline: 16 seconds with SPC  Goal status: INITIAL   LONG TERM GOALS: Target date: 02/09/2022  Pt will be independent with final HEP for PD specific deficits/balance in order to build upon functional gains made in therapy. Baseline:  Goal status: INITIAL  2.  Pt will improve MiniBest to 23/28 for decreased fall risk and improvement with compensatory stepping strategies.   Baseline: 16/28 Goal status: REVISED  3.  Pt will improve gait speed with LRAD to at least 2.6 ft/sec in order to demo improved community mobility.  Baseline: 16.58 seconds = 1.95 ft/sec with SPC Goal status: INITIAL  4.  Pt will improve 5x sit<>stand to less than or equal  to 18 sec with no UE support to demonstrate improved functional strength and transfer efficiency.  Baseline: 28.59 seconds with no UE support Goal status: INITIAL  5.  Pt will verbalize understanding of local Parkinson's disease resources, including options for continued community fitness. Baseline:  Goal status: INITIAL    ASSESSMENT:  CLINICAL IMPRESSION: Performed standing PWR moves in standing to work on wider BOS, larger amplitude movement patterns and balance. Pt needing a chair in front on pt intermittently for balance. Added to HEP (pt had already been doing lateral step for balance). Educated pt on having a wider BOS and staggered stance when doing standing tasks and using UE support as needed for chores/cleaning due to pt having a fall posteriorly. Remainder of session focused on balance with focus on SLS, foot clearance, and retro gait. Pt needing  cues as needed for sequencing of task. Will  continue per POC.     OBJECTIVE IMPAIRMENTS Abnormal gait, decreased activity tolerance, decreased balance, decreased coordination, difficulty walking, decreased strength, impaired flexibility, impaired tone, and postural dysfunction.   ACTIVITY LIMITATIONS bending, squatting, stairs, transfers, and locomotion level  PARTICIPATION LIMITATIONS: driving and community activity  PERSONAL FACTORS Behavior pattern, Past/current experiences, Time since onset of injury/illness/exacerbation, and 3+ comorbidities: Parkinsonism (2018),TIA, thyroid disease, kidney disease, HTN, HLD, GERD, DMII, depression, L hip avascular necrosis, anxiety, anemia, R THA 2011, L THA 2015, knee arthroscopy   are also affecting patient's functional outcome.   REHAB POTENTIAL: Good  CLINICAL DECISION MAKING: Evolving/moderate complexity  EVALUATION COMPLEXITY: Moderate  PLAN: PT FREQUENCY: 2x/week  PT DURATION: 12 weeks  PLANNED INTERVENTIONS: Therapeutic exercises, Therapeutic activity, Neuromuscular  re-education, Balance training, Gait training, Patient/Family education, Self Care, Stair training, Vestibular training, DME instructions, Manual therapy, and Re-evaluation  PLAN FOR NEXT SESSION: Work on stepping strategies, functional strength. Sit <>stands on wedge, scifit for endurance,forward lunge over beam, reaching out of BOS, resisted walking, sit <>stands w/slam ball, treadmill? Review standing PWR moves as needed.   Arliss Journey, PT, DPT  12/30/2021, 12:37 PM

## 2021-12-30 NOTE — Therapy (Addendum)
OUTPATIENT OCCUPATIONAL THERAPY [facility]   Patient Name: Deanna Rowe MRN: [id number] DOB:03-Aug-1955, 66 y.o., female Today's Date: 12/30/2021  PCP: Dr. Radene Ou REFERRING PROVIDER: Ward Givens NP   OT End of Session - 12/30/21 1105     Visit Number 3    Number of Visits 25    Date for OT Re-Evaluation 03/17/22    Authorization Type [facility]    Authorization - Visit Number 3    Authorization - Number of Visits 12   need auth after 25 th visit   OT Start Time 1103    OT Stop Time 1145    OT Time Calculation (min) 42 min    Activity Tolerance Patient tolerated treatment well    Behavior During Therapy WFL for tasks assessed/performed             Past Medical History:  Diagnosis Date   Anemia    Anxiety    Arthritis    L HIP   Asthma    Avascular necrosis of hip ([place])    LEFT   Back pain    Chest pain    Constipation    Depression    Diabetes mellitus    Diabetes mellitus, type II ([place])    Dry cough    Edema, lower extremity    Gait abnormality 11/18/2019   GERD (gastroesophageal reflux disease)    High cholesterol    Hypertension    Insomnia    takes Ambien nightly   Joint pain    Kidney disease    Kidney disease    Obesity    Parkinsonism ([place]) 03/02/2017   PONV (postoperative nausea and vomiting)    RLS (restless legs syndrome) 01/15/2018   Sleep apnea    Swallowing difficulty    Thyroid disease    TIA (transient ischemic attack) 2012   no residual problems   Urgency incontinence    Vitamin D deficiency    Past Surgical History:  Procedure Laterality Date   DILATION AND CURETTAGE OF UTERUS     ESOPHAGEAL MANOMETRY N/A 09/03/2012   Procedure: ESOPHAGEAL MANOMETRY (EM);  Surgeon: Garlan Fair, MD;  Location: [facility];  Service: Endoscopy;  Laterality: N/A;   ESOPHAGEAL MANOMETRY N/A 06/19/2017   Procedure: ESOPHAGEAL MANOMETRY (EM);  Surgeon: Clarene Essex, MD;  Location: [facility];  Service: Endoscopy;  Laterality: N/A;    EXPLORATORY LAPAROTOMY     GANGLION CYST EXCISION     JOINT REPLACEMENT  2011   rt total hip   KNEE ARTHROSCOPY     PILONIDAL CYST / SINUS EXCISION     TOTAL HIP ARTHROPLASTY Left 10/09/2013   Procedure: LEFT TOTAL HIP ARTHROPLASTY ANTERIOR APPROACH;  Surgeon: [name], MD;  Location: [facility];  Service: Orthopedics;  Laterality: Left;   Patient Active Problem List   Diagnosis Date Noted   Allergic conjunctivitis of both eyes 07/06/2020   Gait abnormality 11/18/2019   OSA on CPAP 06/04/2019   Pain in joint of left shoulder 03/28/2019   Seasonal allergic conjunctivitis 02/07/2019   Chronic intermittent hypoxia with obstructive sleep apnea 01/25/2019   Severe obstructive sleep apnea 01/25/2019   Excessive daytime sleepiness 01/03/2019   Non-restorative sleep 01/03/2019   Nocturia more than twice per night 01/03/2019   Weight gain, abnormal 01/03/2019   [place] 01/03/2019   Trochanteric bursitis of right hip 04/27/2018   Encounter for routine screening for malformation using ultrasonics 02/08/2018   Abnormal weight gain 02/08/2018   Acid indigestion 02/08/2018  Acute antritis 02/08/2018   Class 2 severe obesity with serious comorbidity and body mass index (BMI) of 39.0 to 39.9 in adult ([place]) 02/08/2018   Perennial allergic rhinitis 02/08/2018   Antiphospholipid syndrome ([place]) 02/08/2018   Arthralgia of hip or thigh 02/08/2018   Asthma, cough variant 02/08/2018   Vitamin D deficiency 02/08/2018   Backhand tennis elbow 02/08/2018   Cannot sleep 02/08/2018   Chronic kidney disease (CKD), stage III (moderate) ([place]) 02/08/2018   Current drug use 02/08/2018   Depression, major, single episode, in partial remission ([place]) 02/08/2018   Diabetes mellitus with polyneuropathy ([place]) 02/08/2018   Difficulty hearing 02/08/2018   Disturbance of skin sensation 02/08/2018   Hypertension associated with diabetes ([place]) 02/08/2018   Extremity pain 02/08/2018   Facet syndrome, lumbar  02/08/2018   Factor V Leiden ([place]) 02/08/2018   Fungal infection of nail 02/08/2018   Gastroenteritis and colitis, viral 02/08/2018   Hypoglycemia 02/08/2018   Menopause 02/08/2018   Other long term (current) drug therapy 02/08/2018   Other osteonecrosis, unspecified femur ([place]) 02/08/2018   Pure hypercholesterolemia 02/08/2018   Recurrent major depressive episodes ([place]) 02/08/2018   Syncope and collapse 02/08/2018   Not well controlled moderate persistent asthma 02/08/2018   Cough, persistent 02/08/2018   Restless leg syndrome 01/15/2018   Degeneration of lumbar intervertebral disc 11/21/2017   Tremor of right hand 03/02/2017   Parkinson disease ([place]) 03/02/2017   UTI (urinary tract infection) 12/01/2015   Type 2 diabetes mellitus with stage 3b chronic kidney disease, with long-term current use of insulin ([place]) 06/03/2015   Fast heart beat 04/09/2015   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 2 diabetes mellitus ([place]) 04/24/2014   Postoperative anemia due to acute blood loss 10/10/2013   Avascular necrosis of bone of left hip ([place]) 10/09/2013   Arthritis of pelvic region, degenerative 10/09/2013   Breath shortness 06/18/2013   Diabetes mellitus type 2, uncontrolled 05/31/2013   Intermittent claudication ([place]) 04/04/2013   Anxiety    Gastroesophageal reflux disease 09/12/2012   Clinical depression 02/21/2012   Stroke ([place]) 02/11/2012   History of revision of total replacement of right hip joint 04/04/2009    ONSET DATE: 12/15/21  REFERRING DIAG: Parkinsonism  THERAPY DIAG:  Other symptoms and signs involving the nervous system  Other lack of coordination  Unsteadiness on feet  Muscle weakness (generalized)  Attention and concentration deficit  Rationale for Evaluation and Treatment Rehabilitation  SUBJECTIVE:   SUBJECTIVE STATEMENT: I fell yesterday (backwards while cleaning), bruise Lt arm. I'm just sore from the fall Pt accompanied by: self  PERTINENT HISTORY: PERTINENT  HISTORY: Parkinsonism (2018),TIA, thyroid disease, kidney disease, HTN, HLD, GERD, DMII, depression, L hip avascular necrosis, anxiety, anemia, R THA 2011, L THA 2015, knee arthroscopy   PRECAUTIONS: Fall  WEIGHT BEARING RESTRICTIONS No  PAIN:  Are you having pain? No (only soreness from recent fall)   FALLS: Has patient fallen in last 6 months? Yes. Number of falls 5  LIVING ENVIRONMENT: Lives with: lives with their spouse Lives in: House/apartment  PLOF: Independent  PATIENT GOALS to be able style hair  OBJECTIVE:   HAND DOMINANCE: Right   FUNCTIONAL OUTCOME MEASURES: Fastening/unfastening 3 buttons: 50.68 Physical performance test: PPT#2 (simulated eating) 15.03 & PPT#4 (donning/doffing jacket): 13.34  COORDINATION: 9 Hole Peg test: Right: 36.91 sec; Left: 38.16 sec Box and Blocks:  Right 33blocks, Left 37blocks   TODAY'S TREATMENT:  Reviewed PWR! Moves seated (basic 4)  - pt demo each x 10 w/ mod cues for  large amplitude movements. Cues to look at hands for PWR! Rock.   Issued PWR! Hands HEP - Pt demo each x 10 reps  Standing: wt shifts w/ ipsilateral reaching bilaterally to hit targets, followed by contralateral reaching across body bilaterally for trunk rotation  UBE x 5 mintues, level 3 for UE strength/endurance, keeping RPM > 30  Pt reports difficulty w/ buttons, using curling iron, and balance. Discussed modified curling irons. Pt also w/ recent fall - no injuries except bruise Lt upper arm   PATIENT EDUCATION: Education details: PWR! hands Person educated: Patient Education method: Explanation, Demonstration, Verbal cues, and Handouts Education comprehension: verbalized understanding, returned demonstration, and verbal cues required    HOME EXERCISE PROGRAM: 12/27/21: PWR! Seated, coordination HEP, writing strategies 12/30/21: PWR! Hands    ASSESSMENT:  CLINICAL IMPRESSION: Patient fatigues quickly and requires cues for posture (anterior pelvic  tilt) and for eye and head turns. Pt responds well to HEP w/ cueing and encouragement.  PERFORMANCE DEFICITS in functional skills including ADLs, IADLs, coordination, dexterity, proprioception, sensation, tone, ROM, strength, pain, flexibility, FMC, GMC, mobility, balance, endurance, decreased knowledge of precautions, decreased knowledge of use of DME, and UE functional use, bradykinesia , cognitive skills including attention, energy/drive, learn, memory, problem solving, safety awareness, thought, and understand, and psychosocial skills including coping strategies, environmental adaptation, habits, interpersonal interactions, and routines and behaviors.   IMPAIRMENTS are limiting patient from ADLs, IADLs, play, leisure, and social participation.   COMORBIDITIES may have co-morbidities  that affects occupational performance. Patient will benefit from skilled OT to address above impairments and improve overall function.  MODIFICATION OR ASSISTANCE TO COMPLETE EVALUATION: No modification of tasks or assist necessary to complete an evaluation.  OT OCCUPATIONAL PROFILE AND HISTORY: Detailed assessment: Review of records and additional review of physical, cognitive, psychosocial history related to current functional performance.  CLINICAL DECISION MAKING: LOW - limited treatment options, no task modification necessary  REHAB POTENTIAL: Good  EVALUATION COMPLEXITY: Low     GOALS: Goals reviewed with patient? No  SHORT TERM GOALS: Target date: 01/20/22   I with PD specific HEP Baseline: Goal status: INITIAL  2.  Pt will verbalize understanding of adapted strategies to maximize safety and I with ADLs/ IADLs .  Baseline:  Goal status: INITIAL  3.  Pt will demonstrate improved bilateral UE functional use as evidenced by increasing box/ blocks score by 3 blocks Baseline: RUE 33, LUE 37 Goal status: INITIAL  4.  Pt will demonstrate improved ease with feeding as evidenced by decreasing  PPT#2 (self feeding) by 3 secs Baseline: 15.03 Goal status: INITIAL  5.  Pt will report increased ease with styling her hair Baseline: Pt reports difficulty Goal status: INITIAL    LONG TERM GOALS: Target date: 03/17/22- Pt is currently scheduled 1 week outdiPOC  Pt will verbalize understanding of ways to prevent future PD related complications and PD community resources.  Baseline:  Goal status: INITIAL  2.  Pt will demonstrate improved fine motor coordination for ADLs as evidenced by decreasing bilateral  9 hole peg test score for by 3 secs  Baseline: RUE 36.91, LUE 38.16 Goal status: INITIAL  3.  Pt will demonstrate understanding of memory compensations and ways to keep thinking skills sharp  Baseline:  Goal status: INITIAL  4.  Pt will demonstrate improved ease with fastening buttons as evidenced by decreasing 3 button/ unbutton time to 47 secs or less  Baseline: 50.68 Goal status: INITIAL   PLAN: OT FREQUENCY: 2x/week  OT DURATION:  12 weeks POC written for 12 weeks to account for scheduling  PLANNED INTERVENTIONS: self care/ADL training, therapeutic exercise, therapeutic activity, neuromuscular re-education, manual therapy, passive range of motion, balance training, stair training, functional mobility training, aquatic therapy, ultrasound, paraffin, fluidotherapy, moist heat, cryotherapy, contrast bath, patient/family education, cognitive remediation/compensation, visual/perceptual remediation/compensation, energy conservation, coping strategies training, DME and/or AE instructions, and Re-evaluation  RECOMMENDED OTHER SERVICES: PT  CONSULTED AND AGREED WITH PLAN OF CARE: Patient  PLAN FOR NEXT SESSION: PWR! Supine, work on buttons, functional reaching w/ balance, reinforce head/eye movements and posture    Hans Eden, OT 12/30/2021, 11:05 AM

## 2021-12-31 ENCOUNTER — Other Ambulatory Visit (HOSPITAL_BASED_OUTPATIENT_CLINIC_OR_DEPARTMENT_OTHER): Payer: Self-pay

## 2022-01-03 ENCOUNTER — Other Ambulatory Visit (HOSPITAL_BASED_OUTPATIENT_CLINIC_OR_DEPARTMENT_OTHER): Payer: Self-pay

## 2022-01-03 ENCOUNTER — Other Ambulatory Visit (HOSPITAL_COMMUNITY): Payer: Self-pay

## 2022-01-05 ENCOUNTER — Ambulatory Visit: Payer: 59 | Admitting: Physical Therapy

## 2022-01-06 ENCOUNTER — Ambulatory Visit: Payer: 59 | Admitting: Family

## 2022-01-07 ENCOUNTER — Ambulatory Visit: Payer: 59 | Attending: Adult Health | Admitting: Physical Therapy

## 2022-01-07 ENCOUNTER — Other Ambulatory Visit (HOSPITAL_BASED_OUTPATIENT_CLINIC_OR_DEPARTMENT_OTHER): Payer: Self-pay

## 2022-01-07 VITALS — BP 127/77 | HR 77

## 2022-01-07 DIAGNOSIS — R2681 Unsteadiness on feet: Secondary | ICD-10-CM | POA: Diagnosis not present

## 2022-01-07 DIAGNOSIS — M6281 Muscle weakness (generalized): Secondary | ICD-10-CM | POA: Diagnosis not present

## 2022-01-07 DIAGNOSIS — R29818 Other symptoms and signs involving the nervous system: Secondary | ICD-10-CM | POA: Insufficient documentation

## 2022-01-07 DIAGNOSIS — R278 Other lack of coordination: Secondary | ICD-10-CM | POA: Insufficient documentation

## 2022-01-07 NOTE — Therapy (Signed)
OUTPATIENT PHYSICAL THERAPY NEURO TREATMENT   Patient Name: Deanna Rowe MRN: [id number] DOB:[birth year], 66 y.o., female Today's Date: 01/07/2022   PCP: Aretta Nip, MD REFERRING PROVIDER: Ward Givens, NP    PT End of Session - 01/07/22 1109     Visit Number 6    Number of Visits 17    Date for PT Re-Evaluation 03/15/22   due to delay in scheduling/pt going on vacation   Authorization Type [facility]    PT Start Time 1107   PT running late   PT Stop Time 1146    PT Time Calculation (min) 39 min    Equipment Utilized During Treatment Gait belt    Activity Tolerance Patient tolerated treatment well    Behavior During Therapy [facility] for tasks assessed/performed                Past Medical History:  Diagnosis Date   Anemia    Anxiety    Arthritis    L HIP   Asthma    Avascular necrosis of hip ([place])    LEFT   Back pain    Chest pain    Constipation    Depression    Diabetes mellitus    Diabetes mellitus, type II ([place])    Dry cough    Edema, lower extremity    Gait abnormality 11/18/2019   GERD (gastroesophageal reflux disease)    High cholesterol    Hypertension    Insomnia    takes Ambien nightly   Joint pain    Kidney disease    Kidney disease    Obesity    Parkinsonism ([place]) 03/02/2017   PONV (postoperative nausea and vomiting)    RLS (restless legs syndrome) 01/15/2018   Sleep apnea    Swallowing difficulty    Thyroid disease    TIA (transient ischemic attack) 2012   no residual problems   Urgency incontinence    Vitamin D deficiency    Past Surgical History:  Procedure Laterality Date   DILATION AND CURETTAGE OF UTERUS     ESOPHAGEAL MANOMETRY N/A 09/03/2012   Procedure: ESOPHAGEAL MANOMETRY (EM);  Surgeon: Garlan Fair, MD;  Location: [facility];  Service: Endoscopy;  Laterality: N/A;   ESOPHAGEAL MANOMETRY N/A 06/19/2017   Procedure: ESOPHAGEAL MANOMETRY (EM);  Surgeon: Clarene Essex, MD;  Location: [facility];   Service: Endoscopy;  Laterality: N/A;   EXPLORATORY LAPAROTOMY     GANGLION CYST EXCISION     JOINT REPLACEMENT  2011   rt total hip   KNEE ARTHROSCOPY     PILONIDAL CYST / SINUS EXCISION     TOTAL HIP ARTHROPLASTY Left 10/09/2013   Procedure: LEFT TOTAL HIP ARTHROPLASTY ANTERIOR APPROACH;  Surgeon: [name], MD;  Location: [facility];  Service: Orthopedics;  Laterality: Left;   Patient Active Problem List   Diagnosis Date Noted   Allergic conjunctivitis of both eyes 07/06/2020   Gait abnormality 11/18/2019   OSA on CPAP 06/04/2019   Pain in joint of left shoulder 03/28/2019   Seasonal allergic conjunctivitis 02/07/2019   Chronic intermittent hypoxia with obstructive sleep apnea 01/25/2019   Severe obstructive sleep apnea 01/25/2019   Excessive daytime sleepiness 01/03/2019   Non-restorative sleep 01/03/2019   Nocturia more than twice per night 01/03/2019   Weight gain, abnormal 01/03/2019   Snorings 01/03/2019   Trochanteric bursitis of right hip 04/27/2018   Encounter for routine screening for malformation using ultrasonics 02/08/2018   Abnormal weight gain 02/08/2018  Acid indigestion 02/08/2018   Acute antritis 02/08/2018   Class 2 severe obesity with serious comorbidity and body mass index (BMI) of 39.0 to 39.9 in adult ([place]) 02/08/2018   Perennial allergic rhinitis 02/08/2018   Antiphospholipid syndrome ([place]) 02/08/2018   Arthralgia of hip or thigh 02/08/2018   Asthma, cough variant 02/08/2018   Vitamin D deficiency 02/08/2018   Backhand tennis elbow 02/08/2018   Cannot sleep 02/08/2018   Chronic kidney disease (CKD), stage III (moderate) ([place]) 02/08/2018   Current drug use 02/08/2018   Depression, major, single episode, in partial remission ([place]) 02/08/2018   Diabetes mellitus with polyneuropathy ([place]) 02/08/2018   Difficulty hearing 02/08/2018   Disturbance of skin sensation 02/08/2018   Hypertension associated with diabetes ([place]) 02/08/2018   Extremity pain  02/08/2018   Facet syndrome, lumbar 02/08/2018   Factor V Leiden ([place]) 02/08/2018   Fungal infection of nail 02/08/2018   Gastroenteritis and colitis, viral 02/08/2018   Hypoglycemia 02/08/2018   Menopause 02/08/2018   Other long term (current) drug therapy 02/08/2018   Other osteonecrosis, unspecified femur ([place]) 02/08/2018   Pure hypercholesterolemia 02/08/2018   Recurrent major depressive episodes ([place]) 02/08/2018   Syncope and collapse 02/08/2018   Not well controlled moderate persistent asthma 02/08/2018   Cough, persistent 02/08/2018   Restless leg syndrome 01/15/2018   Degeneration of lumbar intervertebral disc 11/21/2017   Tremor of right hand 03/02/2017   Parkinson disease 03/02/2017   UTI (urinary tract infection) 12/01/2015   Type 2 diabetes mellitus with stage 3b chronic kidney disease, with long-term current use of insulin ([place]) 06/03/2015   Fast heart beat 04/09/2015   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 2 diabetes mellitus ([place]) 04/24/2014   Postoperative anemia due to acute blood loss 10/10/2013   Avascular necrosis of bone of left hip ([place]) 10/09/2013   Arthritis of pelvic region, degenerative 10/09/2013   Breath shortness 06/18/2013   Diabetes mellitus type 2, uncontrolled 05/31/2013   Intermittent claudication ([place]) 04/04/2013   Anxiety    Gastroesophageal reflux disease 09/12/2012   Clinical depression 02/21/2012   Stroke ([place]) 02/11/2012   History of revision of total replacement of right hip joint 04/04/2009    ONSET DATE: 12/02/2021   REFERRING DIAG: G20 (ICD-10-CM) - Parkinson disease ([place]) R29.6 (ICD-10-CM) - Falls frequently   THERAPY DIAG:  Other symptoms and signs involving the nervous system  Other lack of coordination  Unsteadiness on feet  Muscle weakness (generalized)  Rationale for Evaluation and Treatment Rehabilitation  SUBJECTIVE:  SUBJECTIVE STATEMENT: Does not feel steady on her feet. Feels more unsteady today. No falls   Pt accompanied by: self  PERTINENT HISTORY: Parkinsonism (2018),TIA, thyroid disease, kidney disease, HTN, HLD, GERD, DMII, depression, L hip avascular necrosis, anxiety, anemia, R THA 2011, L THA 2015, knee arthroscopy   PAIN:  Are you having pain? No  PRECAUTIONS: Fall, No driving.   FALLS: Has patient fallen in last 6 months? Yes. Number of falls 10  VITALS Vitals:   01/07/22 1112  BP: 127/77  Pulse: 77      PLOF: Independent with community mobility with device  PATIENT GOALS Wants to stop falling, improve balance.    OBJECTIVE:   COGNITION: Overall cognitive status: Impaired  TODAY'S TREATMENT:   Therapeutic Exercise: SciFit with BUE/BLE for strengthening, ROM, reciprocal movement patterns at level 2.0 for 8 minutes. Cues for full ROM and incr knee extension.   NMR  Pt performs PWR! Moves in Standing position x 10 reps - with chair in front for safety as needed.    PWR! Up for improved posture - verbal/tactile/manual cues for scap retraction, cues to lower shoulders and open hands   PWR! Rock for improved weighshifting, cues to rock through midline and look up at hands, cues for posture throughout midline   PWR! Twist for improved trunk rotation - cues to use legs to help with twisting and to reset with tall posture in the midline   PWR! Step for improved step initiation - cues for incr foot clearance when stepping.   Cues provided for wider BOS, technique, larger amplitude movement patterns. Reviewed from HEP as pt had not been performing.     10 reps sit <> stands on red balance beam, pt needing to use BUE support to come to stand, cues for incr forward lean and eccentric control when lowering.  On air ex; alternating forward SLS taps to cones x12 reps each leg, with UE support > none, pt  initially more fearful performing without UE support, min guard as needed for balance  In corner with wide BOS on air ex: trunk rotations reaching across body tap tap target laterally x6 reps ,then superior/laterally x8 reps each side, pt losing her balance at times going to the L     GAIT: Gait pattern: decreased arm swing- Left, decreased stride length, Right foot flat, Left foot flat, decreased trunk rotation, trunk flexed, and narrow BOS Distance walked: Clinic distances. Assistive device utilized: Single point cane and None Level of assistance: SBA Comments: Min cues for wider BOS and facilitation of arm swing and incr step length bilat.    PATIENT EDUCATION: Education details: Importance of performing HEP at home.  Person educated: Patient Education method: Explanation Education comprehension: verbalized understanding and needs further education   HOME EXERCISE PROGRAM: Access Code: [email or web address] URL: [email or web address] Date: 12/22/2021 Prepared by: Mickie Bail Plaster  Exercises - Sit to Stand Without Arm Support  - 1 x daily - 7 x weekly - 2 sets - 5-6 reps - Heel Raises with Counter Support  - 1 x daily - 7 x weekly - 3 sets - 10 reps - Step sideways with arms reaching at counter   - 1 x daily - 7 x weekly - 3 sets - 10 reps - Alternating Step Backward with Support  - 1 x daily - 7 x weekly - 3 sets - 10 reps - Standing Quarter Turn with Counter Support  - 1 x daily - 7 x weekly - 3 sets - 10  reps - Seated Hamstring Stretch  - 1 x daily - 7 x weekly - 1 sets - 3-4 reps - 30 second hold - Calf stretch  - 1 x daily - 7 x weekly - 1 sets - 3-4 reps - 30-45 second hold  Standing PWR moves    GOALS: Goals reviewed with patient? Yes  SHORT TERM GOALS: Target date: 01/12/2022  Pt will be independent with initial HEP in order to build upon functional gains made in therapy.  Baseline: Goal status: INITIAL  2.  Pt will improve MiniBest to 19/28 for decreased fall  risk and improvement with compensatory stepping strategies.   Baseline: 16/28 Goal status: REVISED  3.  Pt will improve 5x sit<>stand to less than or equal to 24 sec with no UE support to demonstrate improved functional strength and transfer efficiency.  Baseline: 28.59 seconds with no UE support Goal status: INITIAL  4.  Pt will improve gait speed with LRAD to at least 2.3 ft/sec in order to demo improved community mobility.  Baseline: 16.58 seconds = 1.95 ft/sec with SPC Goal status: INITIAL  5.  Pt will improve manual TUG to 14 seconds or less in order to decr fall risk. Baseline: 16 seconds with SPC  Goal status: INITIAL   LONG TERM GOALS: Target date: 02/09/2022  Pt will be independent with final HEP for PD specific deficits/balance in order to build upon functional gains made in therapy. Baseline:  Goal status: INITIAL  2.  Pt will improve MiniBest to 23/28 for decreased fall risk and improvement with compensatory stepping strategies.   Baseline: 16/28 Goal status: REVISED  3.  Pt will improve gait speed with LRAD to at least 2.6 ft/sec in order to demo improved community mobility.  Baseline: 16.58 seconds = 1.95 ft/sec with SPC Goal status: INITIAL  4.  Pt will improve 5x sit<>stand to less than or equal to 18 sec with no UE support to demonstrate improved functional strength and transfer efficiency.  Baseline: 28.59 seconds with no UE support Goal status: INITIAL  5.  Pt will verbalize understanding of local Parkinson's disease resources, including options for continued community fitness. Baseline:  Goal status: INITIAL    ASSESSMENT:  CLINICAL IMPRESSION: Reviewed PWR moves in standing today for larger amplitude movement patterns as pt does not recall doing them for home for her HEP. Pt able to perform well upon review. Pt fatigued during session and needing intermittent seated rest breaks. Remainder of session focused on functional strength and balance  strategies on compliant surfaces.  Will continue per POC.     OBJECTIVE IMPAIRMENTS Abnormal gait, decreased activity tolerance, decreased balance, decreased coordination, difficulty walking, decreased strength, impaired flexibility, impaired tone, and postural dysfunction.   ACTIVITY LIMITATIONS bending, squatting, stairs, transfers, and locomotion level  PARTICIPATION LIMITATIONS: driving and community activity  PERSONAL FACTORS Behavior pattern, Past/current experiences, Time since onset of injury/illness/exacerbation, and 3+ comorbidities: Parkinsonism (2018),TIA, thyroid disease, kidney disease, HTN, HLD, GERD, DMII, depression, L hip avascular necrosis, anxiety, anemia, R THA 2011, L THA 2015, knee arthroscopy   are also affecting patient's functional outcome.   REHAB POTENTIAL: Good  CLINICAL DECISION MAKING: Evolving/moderate complexity  EVALUATION COMPLEXITY: Moderate  PLAN: PT FREQUENCY: 2x/week  PT DURATION: 12 weeks  PLANNED INTERVENTIONS: Therapeutic exercises, Therapeutic activity, Neuromuscular re-education, Balance training, Gait training, Patient/Family education, Self Care, Stair training, Vestibular training, DME instructions, Manual therapy, and Re-evaluation  PLAN FOR NEXT SESSION: Work on stepping strategies, functional strength. Sit <>stands on wedge, scifit  for endurance,forward lunge over beam, reaching out of BOS, resisted walking, sit <>stands w/slam ball, treadmill? Review standing PWR moves as needed.   Arliss Journey, PT, DPT  01/07/2022, 12:17 PM

## 2022-01-08 ENCOUNTER — Emergency Department (HOSPITAL_BASED_OUTPATIENT_CLINIC_OR_DEPARTMENT_OTHER)
Admission: EM | Admit: 2022-01-08 | Discharge: 2022-01-08 | Disposition: A | Discharge status: Home / Self Care | Payer: 59 | Attending: Emergency Medicine | Admitting: Emergency Medicine

## 2022-01-08 ENCOUNTER — Other Ambulatory Visit: Payer: Self-pay

## 2022-01-08 ENCOUNTER — Encounter (HOSPITAL_BASED_OUTPATIENT_CLINIC_OR_DEPARTMENT_OTHER): Payer: Self-pay | Admitting: Emergency Medicine

## 2022-01-08 ENCOUNTER — Emergency Department (HOSPITAL_BASED_OUTPATIENT_CLINIC_OR_DEPARTMENT_OTHER): Payer: 59

## 2022-01-08 DIAGNOSIS — D649 Anemia, unspecified: Secondary | ICD-10-CM | POA: Diagnosis not present

## 2022-01-08 DIAGNOSIS — Z7982 Long term (current) use of aspirin: Secondary | ICD-10-CM | POA: Diagnosis not present

## 2022-01-08 DIAGNOSIS — M79661 Pain in right lower leg: Secondary | ICD-10-CM | POA: Diagnosis not present

## 2022-01-08 DIAGNOSIS — E1165 Type 2 diabetes mellitus with hyperglycemia: Secondary | ICD-10-CM | POA: Diagnosis not present

## 2022-01-08 DIAGNOSIS — E119 Type 2 diabetes mellitus without complications: Secondary | ICD-10-CM | POA: Diagnosis not present

## 2022-01-08 DIAGNOSIS — M7989 Other specified soft tissue disorders: Secondary | ICD-10-CM | POA: Diagnosis not present

## 2022-01-08 DIAGNOSIS — Z794 Long term (current) use of insulin: Secondary | ICD-10-CM | POA: Insufficient documentation

## 2022-01-08 DIAGNOSIS — M79604 Pain in right leg: Secondary | ICD-10-CM

## 2022-01-08 LAB — BASIC METABOLIC PANEL
Anion gap: 7 (ref 5–15)
BUN: 15 mg/dL (ref 8–23)
CO2: 26 mmol/L (ref 22–32)
Calcium: 8.9 mg/dL (ref 8.9–10.3)
Chloride: 99 mmol/L (ref 98–111)
Creatinine, Ser: 1.11 mg/dL — ABNORMAL HIGH (ref 0.44–1.00)
GFR, Estimated: 55 mL/min — ABNORMAL LOW (ref >60)
Glucose, Bld: 133 mg/dL — ABNORMAL HIGH (ref 70–99)
Potassium: 4 mmol/L (ref 3.5–5.1)
Sodium: 132 mmol/L — ABNORMAL LOW (ref 135–145)

## 2022-01-08 LAB — CBC WITH DIFFERENTIAL/PLATELET
Abs Immature Granulocytes: 0.05 10*3/uL (ref 0.00–0.07)
Basophils Absolute: 0 10*3/uL (ref 0.0–0.1)
Basophils Relative: 0 %
Eosinophils Absolute: 0.1 10*3/uL (ref 0.0–0.5)
Eosinophils Relative: 1 %
HCT: 35.5 % — ABNORMAL LOW (ref 36.0–46.0)
Hemoglobin: 11.9 g/dL — ABNORMAL LOW (ref 12.0–15.0)
Immature Granulocytes: 1 %
Lymphocytes Relative: 44 %
Lymphs Abs: 2.9 10*3/uL (ref 0.7–4.0)
MCH: 33.4 pg (ref 26.0–34.0)
MCHC: 33.5 g/dL (ref 30.0–36.0)
MCV: 99.7 fL (ref 80.0–100.0)
Monocytes Absolute: 0.6 10*3/uL (ref 0.1–1.0)
Monocytes Relative: 10 %
Neutro Abs: 3 10*3/uL (ref 1.7–7.7)
Neutrophils Relative %: 44 %
Platelets: 230 10*3/uL (ref 150–400)
RBC: 3.56 MIL/uL — ABNORMAL LOW (ref 3.87–5.11)
RDW: 12.9 % (ref 11.5–15.5)
WBC: 6.6 10*3/uL (ref 4.0–10.5)
nRBC: 0 % (ref 0.0–0.2)

## 2022-01-08 MED ORDER — CYCLOBENZAPRINE HCL 5 MG PO TABS: 5.0000 mg | ORAL_TABLET | Freq: Every day | ORAL | 0 refills | Status: DC

## 2022-01-08 NOTE — Discharge Instructions (Signed)
Please read and follow all provided instructions.  Your diagnoses today include:  1. Right leg pain     Tests performed today include: Complete blood cell count: No concerns Basic metabolic panel: No concerns Vital signs. See below for your results today.   Medications prescribed:  Flexeril (cyclobenzaprine) - muscle relaxer medication  DO NOT drive or perform any activities that require you to be awake and alert because this medicine can make you drowsy.   Take any prescribed medications only as directed.  Home care instructions:  Follow any educational materials contained in this packet.  BE VERY CAREFUL not to take multiple medicines containing Tylenol (also called acetaminophen). Doing so can lead to an overdose which can damage your liver and cause liver failure and possibly death.   Follow-up instructions: Please follow-up with your primary care provider in the next 3 days for further evaluation of your symptoms.   Return instructions:  Please return to the Emergency Department if you experience worsening symptoms.  Please return if you have any other emergent concerns.  Additional Information:  Your vital signs today were: BP (!) 152/84   Pulse 65   Temp (!) 97 F (36.1 C)   Resp 18   SpO2 100%  If your blood pressure (BP) was elevated above 135/85 this visit, please have this repeated by your doctor within one month. --------------

## 2022-01-08 NOTE — ED Triage Notes (Signed)
Pt reports R leg pain from knee to foot since Monday. Also feels like foot is swollen. Had a fall a few days ago, but did not seem turn to hurt this area. Pt ambulatory to triage.

## 2022-01-08 NOTE — ED Notes (Signed)
Patient transported to Ultrasound 

## 2022-01-08 NOTE — ED Provider Notes (Signed)
[place] EMERGENCY DEPARTMENT Provider Note   CSN: [id number] Arrival date & time: 01/08/22  1553     History  Chief Complaint  Patient presents with   Leg Pain    Deanna Rowe is a 66 y.o. female.  Patient with history of diabetes presents to the emergency department today for evaluation of right lower extremity pain.  Patient's symptoms started about 5 days ago.  She describes pain that starts behind the knee and runs down the posterior calf.  It is worse when she stretches.  She feels like she has swelling of the calf down to her foot.  Initially she thought that that the pain was "just a cramping ".  She took a muscle pain medication without improvement.  She went to an outside clinic today and was referred to the emergency department due to concern for Baker's cyst or DVT.  Patient has never had a blood clot.  She takes a baby aspirin daily.  Patient denies risk factors for pulmonary embolism including: unilateral leg swelling, history of DVT/PE/other blood clots, use of exogenous hormones, recent immobilizations, recent surgery, recent travel (>4hr segment), malignancy, hemoptysis.  She is able to bear weight but states that it is uncomfortable.       Home Medications Prior to Admission medications   Medication Sig Start Date End Date Taking? Authorizing Provider  ACCU-CHEK FASTCLIX LANCETS MISC USE TO CHECK BLOOD SUGAR 3 TIMES A DAY 02/27/18   Renato Shin, MD  ACCU-CHEK GUIDE test strip USE TO CHECK BLOOD SUGAR 3 TIMES PER DAY. Patient taking differently: Use to check blood sugar 4 times per day. 02/02/17   Renato Shin, MD  albuterol (VENTOLIN HFA) 108 (90 Base) MCG/ACT inhaler Inhale 2 puffs into the lungs as needed for wheezing or shortness of breath. 07/06/20   Dara Hoyer, FNP  amLODipine (NORVASC) 5 MG tablet Take 1 tablet by mouth once a day 11/08/21     aspirin EC 81 MG tablet Take 81 mg by mouth every evening.    [provider]  atorvastatin  (LIPITOR) 10 MG tablet TAKE 1 TABLET BY MOUTH AT BEDTIME 04/12/21     azelastine (ASTELIN) 0.1 % nasal spray Place 2 sprays into both nostrils 2 (two) times daily as needed. 11/25/21   Althea Charon, FNP  brexpiprazole (REXULTI) 2 MG TABS tablet Take 1 tablet (2 mg total) by mouth in the morning. 10/13/21     buPROPion (WELLBUTRIN XL) 150 MG 24 hr tablet Take 3 tablets (450 mg total) by mouth in the morning. 10/13/21     Calcium Carbonate-Vitamin D 600-400 MG-UNIT tablet Take 1 tablet by mouth daily.    [provider]  carbidopa-levodopa (SINEMET CR) 50-200 MG tablet TAKE 1 TABLET BY MOUTH 5 (FIVE) TIMES DAILY. 08/12/21 08/12/22  Star Age, MD  carbidopa-levodopa (SINEMET) 25-250 MG tablet Take 1 tablet by mouth in the morning and 1 tablet at 2pm 08/16/21   Star Age, MD  Continuous Blood Gluc Receiver (DEXCOM G7 RECEIVER) DEVI Use as directed to check blood sugar 11/25/21     Continuous Blood Gluc Sensor (DEXCOM G7 SENSOR) MISC Use to check blood sugar and change sensor every 10 days 11/25/21     EPINEPHrine (EPIPEN 2-PAK) 0.3 mg/0.3 mL IJ SOAJ injection Inject 0.3 mg into the muscle as needed for anaphylaxis. 11/11/21   Althea Charon, FNP  gabapentin (NEURONTIN) 300 MG capsule TAKE 1 CAPSULE BY MOUTH TWICE DAILY 10/12/21     glucose blood test  strip Use to check blood sugar 4 times daily 09/30/21     influenza vaccine adjuvanted (FLUAD QUADRIVALENT) 0.5 ML injection Inject into the muscle. 12/23/21   Carlyle Basques, MD  influenza vaccine adjuvanted (FLUAD) 0.5 ML injection Inject into the muscle. 12/21/20   Carlyle Basques, MD  insulin degludec (TRESIBA FLEXTOUCH) 100 UNIT/ML FlexTouch Pen INJECT UNDER THE SKIN ONCE DAILY. TRITRATE AS DIRECTED WITH A MAXIMUM DAILY DOSE OF 50 UNITS    [provider]  insulin lispro (HUMALOG) 100 UNIT/ML injection SQ - 14 units at breakfast, 12 units at lunch, 16 units at supper 11/05/19   Whitmire, Dawn W, FNP  Insulin Pen Needle (TECHLITE PEN NEEDLES)  32G X 6 MM MISC use to Inject insulin into the skin 4 (four) times daily. 12/14/21   Delrae Rend, MD  lansoprazole (PREVACID) 15 MG capsule Take 1 capsule (15 mg total) by mouth daily at 12 noon. 04/05/21   Ambs, Kathrine Cords, FNP  levocetirizine (XYZAL) 5 MG tablet Take 1 tablet (5 mg total) by mouth daily as needed. 07/06/20 11/11/21  Dara Hoyer, FNP  LORazepam (ATIVAN) 2 MG tablet Take 1 tablet (2 mg total) by mouth 3 (three) times daily. 10/13/21     losartan (COZAAR) 100 MG tablet Take 1 tablet (100 mg total) by mouth daily. 12/24/21     melatonin 5 MG TABS Take 5 mg by mouth at bedtime.    [provider]  mirabegron ER (MYRBETRIQ) 50 MG TB24 tablet Take 1 tablet by mouth everyday 10/21/21     Olopatadine HCl (PATADAY) 0.2 % SOLN Place 1 drop into both eyes daily as needed. 07/06/20   Dara Hoyer, FNP  omalizumab Arvid Right) 150 MG/ML prefilled syringe Inject 300 mg into the skin every 28 days. 08/02/21   Tresa Garter, MD  propranolol (INDERAL) 40 MG tablet TAKE 1 TABLET BY MOUTH TWICE DAILY 05/07/21   Jettie Booze, MD  rOPINIRole (REQUIP) 2 MG tablet Take 1 tablet (2 mg total) by mouth 2 (two) times daily. 10/13/21     Semaglutide-Weight Management (WEGOVY) 2.4 MG/0.75ML SOAJ Inject 2.4 mg under the skin Once a week 08/24/21     solifenacin (VESICARE) 10 MG tablet TAKE 1 TABLET BY MOUTH ONCE DAILY 05/10/21 05/10/22    triamcinolone (NASACORT) 55 MCG/ACT AERO nasal inhaler Place 2 sprays into the nose daily. 07/06/20   Dara Hoyer, FNP  valACYclovir (VALTREX) 500 MG tablet Take 1 tablet by mouth everyday 07/06/21     vitamin C (ASCORBIC ACID) 500 MG tablet Take 500 mg by mouth daily.    [provider]  Vitamin D, Ergocalciferol, 2000 units CAPS Take 1 capsule by mouth daily. 2000 units daily.    [provider]  vortioxetine HBr (TRINTELLIX) 20 MG TABS tablet Take 1 tablet (20 mg total) by mouth every morning. 10/13/21     zolpidem (AMBIEN) 5 MG tablet Take 1 tablet (5 mg  total) by mouth at bedtime. 10/13/21         Allergies    Fluticasone-salmeterol, Codeine, Fetzima [levomilnacipran], Nsaids, Trulicity [dulaglutide], Xanax [alprazolam], Amoxicillin, and Pseudoephedrine hcl er    Review of Systems   Review of Systems  Physical Exam Updated Vital Signs BP (!) 149/83   Pulse 65   Temp (!) 97 F (36.1 C)   Resp 18   SpO2 97%  Physical Exam Vitals and nursing note reviewed.  Constitutional:      General: She is not in acute distress.  Appearance: She is well-developed.  HENT:     Head: Normocephalic and atraumatic.     Right Ear: External ear normal.     Left Ear: External ear normal.     Nose: Nose normal.  Eyes:     Conjunctiva/sclera: Conjunctivae normal.  Cardiovascular:     Rate and Rhythm: Normal rate and regular rhythm.     Heart sounds: No murmur heard. Pulmonary:     Effort: No respiratory distress.     Breath sounds: No wheezing, rhonchi or rales.  Abdominal:     Palpations: Abdomen is soft.     Tenderness: There is no abdominal tenderness. There is no guarding or rebound.  Musculoskeletal:     Cervical back: Normal range of motion and neck supple.     Right upper leg: No edema or tenderness.     Right knee: No swelling. Normal range of motion. No tenderness.     Right lower leg: Tenderness (mild, posterior calf) present. No edema.     Left lower leg: No edema.     Right ankle: No swelling. No tenderness. Normal range of motion.     Right foot: Normal range of motion. No swelling. Normal pulse.  Skin:    General: Skin is warm and dry.     Findings: No rash.  Neurological:     General: No focal deficit present.     Mental Status: She is alert. Mental status is at baseline.     Motor: No weakness.  Psychiatric:        Mood and Affect: Mood normal.     ED Results / Procedures / Treatments   Labs (all labs ordered are listed, but only abnormal results are displayed) Labs Reviewed  CBC WITH DIFFERENTIAL/PLATELET -  Abnormal; Notable for the following components:      Result Value   RBC 3.56 (*)    Hemoglobin 11.9 (*)    HCT 35.5 (*)    All other components within normal limits  BASIC METABOLIC PANEL - Abnormal; Notable for the following components:   Sodium 132 (*)    Glucose, Bld 133 (*)    Creatinine, Ser 1.11 (*)    GFR, Estimated 55 (*)    All other components within normal limits    EKG None  Radiology US Venous Img Lower Right (DVT Study)  Result Date: 01/08/2022 CLINICAL DATA:  Calf pain and right lower extremity swelling EXAM: RIGHT LOWER EXTREMITY VENOUS DOPPLER ULTRASOUND TECHNIQUE: Gray-scale sonography with graded compression, as well as color Doppler and duplex ultrasound were performed to evaluate the lower extremity deep venous systems from the level of the common femoral vein and including the common femoral, femoral, profunda femoral, popliteal and calf veins including the posterior tibial, peroneal and gastrocnemius veins when visible. The superficial great saphenous vein was also interrogated. Spectral Doppler was utilized to evaluate flow at rest and with distal augmentation maneuvers in the common femoral, femoral and popliteal veins. COMPARISON:  None Available. FINDINGS: Contralateral Common Femoral Vein: Respiratory phasicity is normal and symmetric with the symptomatic side. No evidence of thrombus. Normal compressibility. Common Femoral Vein: No evidence of thrombus. Normal compressibility, respiratory phasicity and response to augmentation. Saphenofemoral Junction: No evidence of thrombus. Normal compressibility and flow on color Doppler imaging. Profunda Femoral Vein: No evidence of thrombus. Normal compressibility and flow on color Doppler imaging. Femoral Vein: No evidence of thrombus. Normal compressibility, respiratory phasicity and response to augmentation. Popliteal Vein: No evidence of thrombus. Normal compressibility, respiratory  phasicity and response to augmentation.  Calf Veins: No evidence of thrombus. Normal compressibility and flow on color Doppler imaging. Superficial Great Saphenous Vein: No evidence of thrombus. Normal compressibility. Venous Reflux:  None. Other Findings:  None. IMPRESSION: No evidence of deep venous thrombosis in the right lower extremity. Electronically Signed   By: Beryle Flock M.D.   On: 01/08/2022 16:57    Procedures Procedures    Medications Ordered in ED Medications - No data to display  ED Course/ Medical Decision Making/ A&P    Patient seen and examined. History obtained directly from patient.   Labs/EKG: Ordered CBC, BMP.  Imaging: Ordered RLE DVT study.  Medications/Fluids: None  Most recent vital signs reviewed and are as follows: BP (!) 149/83   Pulse 65   Temp (!) 97 F (36.1 C)   Resp 18   SpO2 97%   Initial impression: R LE pain.   5:34 PM Reassessment performed. Patient appears stable.  Family now at bedside.  Labs personally reviewed and interpreted including: CBC with normal white blood cell count, mild anemia 11.9 hemoglobin; BMP mildly low sodium at 132, glucose 133, creatinine minimally elevated at 1.11.  Imaging results reviewed: Lower extremity Doppler without signs of DVT or other emergent findings.  Reviewed pertinent lab work and imaging with patient at bedside. Questions answered.   Most current vital signs reviewed and are as follows: BP (!) 152/84   Pulse 65   Temp (!) 97 F (36.1 C)   Resp 18   SpO2 100%   Plan: Discharge to home.   Prescriptions written for: Flexeril 5 mg p.o. for use at bedtime if needed.   Other home care instructions discussed: OTC medications, heat  ED return instructions discussed: Worsening pain, swelling, fever  Follow-up instructions discussed: Patient encouraged to follow-up with their PCP in 3 days for recheck if not improving with supportive care.                            Medical Decision Making Amount and/or Complexity of Data  Reviewed Labs: ordered.   Patient with right lower extremity pain without injury.  She has preserved range of motion.  Suspect bony injury.  Do not suspect focal arthritis.  Sent to rule out DVT and Baker's cyst, negative Doppler ultrasound.  Labs are reassuring.  No signs of cellulitis, abscess.  Normal pulse and do not suspect arterial insufficiency.  Possible muscular injury versus neuropathic pain.        Final Clinical Impression(s) / ED Diagnoses Final diagnoses:  Right leg pain    Rx / DC Orders ED Discharge Orders          Ordered    cyclobenzaprine (FLEXERIL) 5 MG tablet  Daily at bedtime        01/08/22 1733              Carlisle Cater, PA-C 01/08/22 1737    Malvin Johns, MD 01/08/22 2116

## 2022-01-10 ENCOUNTER — Other Ambulatory Visit (HOSPITAL_BASED_OUTPATIENT_CLINIC_OR_DEPARTMENT_OTHER): Payer: Self-pay

## 2022-01-10 MED ORDER — GABAPENTIN 300 MG PO CAPS
300.0000 mg | ORAL_CAPSULE | Freq: Two times a day (BID) | ORAL | 0 refills | Status: DC
  Filled 2022-01-10: qty 180, 90d supply, fill #0

## 2022-01-11 ENCOUNTER — Ambulatory Visit: Payer: 59 | Admitting: Physical Therapy

## 2022-01-11 DIAGNOSIS — R2681 Unsteadiness on feet: Secondary | ICD-10-CM

## 2022-01-11 DIAGNOSIS — R278 Other lack of coordination: Secondary | ICD-10-CM | POA: Diagnosis not present

## 2022-01-11 DIAGNOSIS — M6281 Muscle weakness (generalized): Secondary | ICD-10-CM

## 2022-01-11 DIAGNOSIS — R29818 Other symptoms and signs involving the nervous system: Secondary | ICD-10-CM | POA: Diagnosis not present

## 2022-01-11 NOTE — Therapy (Signed)
OUTPATIENT PHYSICAL THERAPY NEURO TREATMENT   Patient Name: Deanna Rowe MRN: [id number] DOB:09-Nov-1955, 66 y.o., female Today's Date: 01/11/2022   PCP: Aretta Nip, MD REFERRING PROVIDER: Ward Givens, NP    PT End of Session - 01/11/22 1106     Visit Number 7    Number of Visits 17    Date for PT Re-Evaluation 03/15/22   due to delay in scheduling/pt going on vacation   Authorization Type [employer]    PT Start Time 1104   PT running late   Equipment Utilized During Treatment Gait belt    Activity Tolerance Patient tolerated treatment well    Behavior During Therapy [facility] for tasks assessed/performed                 Past Medical History:  Diagnosis Date   Anemia    Anxiety    Arthritis    L HIP   Asthma    Avascular necrosis of hip ([place])    LEFT   Back pain    Chest pain    Constipation    Depression    Diabetes mellitus    Diabetes mellitus, type II (HCC)    Dry cough    Edema, lower extremity    Gait abnormality 11/18/2019   GERD (gastroesophageal reflux disease)    High cholesterol    Hypertension    Insomnia    takes Ambien nightly   Joint pain    Kidney disease    Kidney disease    Obesity    Parkinsonism 03/02/2017   PONV (postoperative nausea and vomiting)    RLS (restless legs syndrome) 01/15/2018   Sleep apnea    Swallowing difficulty    Thyroid disease    TIA (transient ischemic attack) 2012   no residual problems   Urgency incontinence    Vitamin D deficiency    Past Surgical History:  Procedure Laterality Date   DILATION AND CURETTAGE OF UTERUS     ESOPHAGEAL MANOMETRY N/A 09/03/2012   Procedure: ESOPHAGEAL MANOMETRY (EM);  Surgeon: Garlan Fair, MD;  Location: [facility];  Service: Endoscopy;  Laterality: N/A;   ESOPHAGEAL MANOMETRY N/A 06/19/2017   Procedure: ESOPHAGEAL MANOMETRY (EM);  Surgeon: Clarene Essex, MD;  Location: [facility];  Service: Endoscopy;  Laterality: N/A;   EXPLORATORY LAPAROTOMY      GANGLION CYST EXCISION     JOINT REPLACEMENT  2011   rt total hip   KNEE ARTHROSCOPY     PILONIDAL CYST / SINUS EXCISION     TOTAL HIP ARTHROPLASTY Left 10/09/2013   Procedure: LEFT TOTAL HIP ARTHROPLASTY ANTERIOR APPROACH;  Surgeon: [name], MD;  Location: [facility];  Service: Orthopedics;  Laterality: Left;   Patient Active Problem List   Diagnosis Date Noted   Allergic conjunctivitis of both eyes 07/06/2020   Gait abnormality 11/18/2019   OSA on CPAP 06/04/2019   Pain in joint of left shoulder 03/28/2019   Seasonal allergic conjunctivitis 02/07/2019   Chronic intermittent hypoxia with obstructive sleep apnea 01/25/2019   Severe obstructive sleep apnea 01/25/2019   Excessive daytime sleepiness 01/03/2019   Non-restorative sleep 01/03/2019   Nocturia more than twice per night 01/03/2019   Weight gain, abnormal 01/03/2019   Snorings 01/03/2019   Trochanteric bursitis of right hip 04/27/2018   Encounter for routine screening for malformation using ultrasonics 02/08/2018   Abnormal weight gain 02/08/2018   Acid indigestion 02/08/2018   Acute antritis 02/08/2018   Class 2 severe obesity  with serious comorbidity and body mass index (BMI) of 39.0 to 39.9 in adult [facility]) 02/08/2018   Perennial allergic rhinitis 02/08/2018   Antiphospholipid syndrome ([place]) 02/08/2018   Arthralgia of hip or thigh 02/08/2018   Asthma, cough variant 02/08/2018   Vitamin D deficiency 02/08/2018   Backhand tennis elbow 02/08/2018   Cannot sleep 02/08/2018   Chronic kidney disease (CKD), stage III (moderate) (HCC) 02/08/2018   Current drug use 02/08/2018   Depression, major, single episode, in partial remission ([place]) 02/08/2018   Diabetes mellitus with polyneuropathy ([place]) 02/08/2018   Difficulty hearing 02/08/2018   Disturbance of skin sensation 02/08/2018   Hypertension associated with diabetes ([place]) 02/08/2018   Extremity pain 02/08/2018   Facet syndrome, lumbar 02/08/2018   Factor V Leiden ([place])  02/08/2018   Fungal infection of nail 02/08/2018   Gastroenteritis and colitis, viral 02/08/2018   Hypoglycemia 02/08/2018   Menopause 02/08/2018   Other long term (current) drug therapy 02/08/2018   Other osteonecrosis, unspecified femur ([place]) 02/08/2018   Pure hypercholesterolemia 02/08/2018   Recurrent major depressive episodes ([place]) 02/08/2018   Syncope and collapse 02/08/2018   Not well controlled moderate persistent asthma 02/08/2018   Cough, persistent 02/08/2018   Restless leg syndrome 01/15/2018   Degeneration of lumbar intervertebral disc 11/21/2017   Tremor of right hand 03/02/2017   Parkinson disease 03/02/2017   UTI (urinary tract infection) 12/01/2015   Type 2 diabetes mellitus with stage 3b chronic kidney disease, with long-term current use of insulin ([place]) 06/03/2015   Fast heart beat 04/09/2015   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 2 diabetes mellitus ([place]) 04/24/2014   Postoperative anemia due to acute blood loss 10/10/2013   Avascular necrosis of bone of left hip ([place]) 10/09/2013   Arthritis of pelvic region, degenerative 10/09/2013   Breath shortness 06/18/2013   Diabetes mellitus type 2, uncontrolled 05/31/2013   Intermittent claudication ([place]) 04/04/2013   Anxiety    Gastroesophageal reflux disease 09/12/2012   Clinical depression 02/21/2012   Stroke ([place]) 02/11/2012   History of revision of total replacement of right hip joint 04/04/2009    ONSET DATE: 12/02/2021   REFERRING DIAG: G20 (ICD-10-CM) - Parkinson disease ([place]) R29.6 (ICD-10-CM) - Falls frequently   THERAPY DIAG:  Unsteadiness on feet  Other lack of coordination  Muscle weakness (generalized)  Rationale for Evaluation and Treatment Rehabilitation  SUBJECTIVE:                                                                                                                                                                                              SUBJECTIVE STATEMENT: Pt reports she feels more  steady today. Went to the ED over the weekend due to severe RLE pain, does not have a DVT and pain is better today. No new changes. HEP is still challenging   Pt accompanied by: self  PERTINENT HISTORY: Parkinsonism (2018),TIA, thyroid disease, kidney disease, HTN, HLD, GERD, DMII, depression, L hip avascular necrosis, anxiety, anemia, R THA 2011, L THA 2015, knee arthroscopy   PAIN:  Are you having pain? Yes: NPRS scale: 5/10 Pain location: RLE Pain description: Sharp   PRECAUTIONS: Fall, No driving.   FALLS: Has patient fallen in last 6 months? Yes. Number of falls 10  VITALS There were no vitals filed for this visit.   PLOF: Independent with community mobility with device  PATIENT GOALS Wants to stop falling, improve balance.    OBJECTIVE:   COGNITION: Overall cognitive status: Impaired  TODAY'S TREATMENT:  Ther Act STG Assessment   [facility] PT Assessment - 01/11/22 1110       Transfers   Five time sit to stand comments  13.21s without UE support      Ambulation/Gait   Gait velocity 32.8' over 13.35s = 2.31f/s w/SPC      Mini-BESTest   Sit To Stand Normal: Comes to stand without use of hands and stabilizes independently.    Rise to Toes Normal: Stable for 3 s with maximum height.    Stand on one leg (left) Moderate: < 20 s   8.12s   Stand on one leg (right) Moderate: < 20 s   1.93s   Stand on one leg - lowest score 1    Compensatory Stepping Correction - Forward Normal: Recovers independently with a single, large step (second realignement is allowed).    Compensatory Stepping Correction - Backward Moderate: More than one step is required to recover equilibrium   4 small steps   Compensatory Stepping Correction - Left Lateral Moderate: Several steps to recover equilibrium   4 small steps (one crossover step)   Compensatory Stepping Correction - Right Lateral Moderate: Several steps to recover equilibrium   3 small steps (one crossover posteriorly)   Stepping  Corredtion Lateral - lowest score 1    Stance - Feet together, eyes open, firm surface  Normal: 30s    Stance - Feet together, eyes closed, foam surface  Moderate: < 30s   12s; lateral lean to L side   Incline - Eyes Closed Normal: Stands independently 30s and aligns with gravity    Change in Gait Speed Moderate: Unable to change walking speed or signs of imbalance    Walk with head turns - Horizontal Moderate: performs head turns with reduction in gait speed.    Walk with pivot turns Moderate:Turns with feet close SLOW (>4 steps) with good balance.    Step over obstacles Normal: Able to step over box with minimal change of gait speed and with good balance.    Timed UP & GO with Dual Task Severe: Stops counting while walking OR stops walking while counting.   Pt unable to count or name colors   Mini-BEST total score 19      Timed Up and Go Test   Manual TUG (seconds) 21.93   22.47s w/o cane, 21.93s w/cane              GAIT: Gait pattern: decreased arm swing- Left, decreased stride length, Right foot flat, Left foot flat, decreased trunk rotation, trunk flexed, and narrow BOS Distance walked: Clinic distances. Assistive device utilized: Single point cane and None Level  of assistance: SBA Comments: Min cues for wider BOS and facilitation of arm swing and incr step length bilat.    PATIENT EDUCATION: Education details: Goal outcomes, continue HEP, next appointment time Person educated: Patient Education method: Explanation Education comprehension: verbalized understanding and needs further education   HOME EXERCISE PROGRAM: Access Code: [email or web address] URL: [email or web address] Date: 12/22/2021 Prepared by: Mickie Bail Chole Driver  Exercises - Sit to Stand Without Arm Support  - 1 x daily - 7 x weekly - 2 sets - 5-6 reps - Heel Raises with Counter Support  - 1 x daily - 7 x weekly - 3 sets - 10 reps - Step sideways with arms reaching at counter   - 1 x daily - 7 x weekly - 3  sets - 10 reps - Alternating Step Backward with Support  - 1 x daily - 7 x weekly - 3 sets - 10 reps - Standing Quarter Turn with Counter Support  - 1 x daily - 7 x weekly - 3 sets - 10 reps - Seated Hamstring Stretch  - 1 x daily - 7 x weekly - 1 sets - 3-4 reps - 30 second hold - Calf stretch  - 1 x daily - 7 x weekly - 1 sets - 3-4 reps - 30-45 second hold  Standing PWR moves    GOALS: Goals reviewed with patient? Yes  SHORT TERM GOALS: Target date: 01/12/2022  Pt will be independent with initial HEP in order to build upon functional gains made in therapy.  Baseline: Goal status: MET  2.  Pt will improve MiniBest to 19/28 for decreased fall risk and improvement with compensatory stepping strategies.   Baseline: 16/28; 19/28  Goal status: MET  3.  Pt will improve 5x sit<>stand to less than or equal to 24 sec with no UE support to demonstrate improved functional strength and transfer efficiency.  Baseline: 28.59 seconds with no UE support; 13.21s without UE support Goal status: MET  4.  Pt will improve gait speed with LRAD to at least 2.3 ft/sec in order to demo improved community mobility.  Baseline: 16.58 seconds = 1.95 ft/sec with SPC; 2.12ft/s w/SPC Goal status: MET  5.  Pt will improve manual TUG to 14 seconds or less in order to decr fall risk. Baseline: 16 seconds with SPC; 21.93s w/SPC Goal status: NOT MET   LONG TERM GOALS: Target date: 02/09/2022  Pt will be independent with final HEP for PD specific deficits/balance in order to build upon functional gains made in therapy. Baseline:  Goal status: INITIAL  2.  Pt will improve MiniBest to 23/28 for decreased fall risk and improvement with compensatory stepping strategies.   Baseline: 16/28 Goal status: REVISED  3.  Pt will improve gait speed with LRAD to at least 2.8 ft/sec in order to demo improved community mobility.  Baseline: 16.58 seconds = 1.95 ft/sec with SPC; 2.5 ft/s w/ SPC on 10/10  Goal status:  REVISED   4.  Pt will improve 5x sit<>stand to less than or equal to 18 sec with no UE support to demonstrate improved functional strength and transfer efficiency.  Baseline: 28.59 seconds with no UE support; 13.21s on 10/10  Goal status: MET  5.  Pt will verbalize understanding of local Parkinson's disease resources, including options for continued community fitness. Baseline:  Goal status: INITIAL    ASSESSMENT:  CLINICAL IMPRESSION: Emphasis of skilled PT session on STG assessment. Pt has met 4/5 STGs, demonstrating significant improvements in gait speed and transfers w/no  LOB noted. Pt improved her miniBest score by 3 points, noted by her ability to rise up on toes and exhibit stepping strategies on L side. Pt most challenged by dual-tasking, particularly cog dual-tasks. Pt states that due to her ECT, she can no longer count. Regressed task to naming colors, but pt still unable to perform. Continue POC.     OBJECTIVE IMPAIRMENTS Abnormal gait, decreased activity tolerance, decreased balance, decreased coordination, difficulty walking, decreased strength, impaired flexibility, impaired tone, and postural dysfunction.   ACTIVITY LIMITATIONS bending, squatting, stairs, transfers, and locomotion level  PARTICIPATION LIMITATIONS: driving and community activity  PERSONAL FACTORS Behavior pattern, Past/current experiences, Time since onset of injury/illness/exacerbation, and 3+ comorbidities: Parkinsonism (2018),TIA, thyroid disease, kidney disease, HTN, HLD, GERD, DMII, depression, L hip avascular necrosis, anxiety, anemia, R THA 2011, L THA 2015, knee arthroscopy   are also affecting patient's functional outcome.   REHAB POTENTIAL: Good  CLINICAL DECISION MAKING: Evolving/moderate complexity  EVALUATION COMPLEXITY: Moderate  PLAN: PT FREQUENCY: 2x/week  PT DURATION: 12 weeks  PLANNED INTERVENTIONS: Therapeutic exercises, Therapeutic activity, Neuromuscular re-education, Balance  training, Gait training, Patient/Family education, Self Care, Stair training, Vestibular training, DME instructions, Manual therapy, and Re-evaluation  PLAN FOR NEXT SESSION: Work on stepping strategies, functional strength. Sit <>stands on wedge, scifit for endurance,forward lunge over beam, reaching out of BOS, resisted walking, sit <>stands w/slam ball, treadmill? Review standing PWR moves as needed. Dual tasking   Cruzita Lederer Chike Farrington, PT, DPT  01/11/2022, 11:39 AM

## 2022-01-13 ENCOUNTER — Other Ambulatory Visit (HOSPITAL_BASED_OUTPATIENT_CLINIC_OR_DEPARTMENT_OTHER): Payer: Self-pay

## 2022-01-13 ENCOUNTER — Ambulatory Visit (INDEPENDENT_AMBULATORY_CARE_PROVIDER_SITE_OTHER): Payer: 59 | Admitting: Internal Medicine

## 2022-01-13 ENCOUNTER — Other Ambulatory Visit (HOSPITAL_COMMUNITY): Payer: Self-pay

## 2022-01-13 DIAGNOSIS — G629 Polyneuropathy, unspecified: Secondary | ICD-10-CM | POA: Diagnosis not present

## 2022-01-13 DIAGNOSIS — M79606 Pain in leg, unspecified: Secondary | ICD-10-CM | POA: Diagnosis not present

## 2022-01-14 ENCOUNTER — Encounter: Payer: Self-pay | Admitting: Physical Therapy

## 2022-01-14 ENCOUNTER — Other Ambulatory Visit (HOSPITAL_BASED_OUTPATIENT_CLINIC_OR_DEPARTMENT_OTHER): Payer: Self-pay

## 2022-01-14 ENCOUNTER — Ambulatory Visit: Payer: 59 | Admitting: Physical Therapy

## 2022-01-14 DIAGNOSIS — R29818 Other symptoms and signs involving the nervous system: Secondary | ICD-10-CM | POA: Diagnosis not present

## 2022-01-14 DIAGNOSIS — M6281 Muscle weakness (generalized): Secondary | ICD-10-CM | POA: Diagnosis not present

## 2022-01-14 DIAGNOSIS — R2681 Unsteadiness on feet: Secondary | ICD-10-CM

## 2022-01-14 DIAGNOSIS — R278 Other lack of coordination: Secondary | ICD-10-CM | POA: Diagnosis not present

## 2022-01-14 NOTE — Therapy (Signed)
OUTPATIENT PHYSICAL THERAPY NEURO TREATMENT   Patient Name: Deanna Rowe MRN: [id number] DOB:01-24-1956, 66 y.o., female Today's Date: 01/14/2022   PCP: Aretta Nip, MD REFERRING PROVIDER: Ward Givens, NP    PT End of Session - 01/14/22 1108     Visit Number 8    Number of Visits 17    Date for PT Re-Evaluation 03/15/22   due to delay in scheduling/pt going on vacation   Authorization Type [employer]    PT Start Time 1106   PT running late   PT Stop Time 1146    PT Time Calculation (min) 40 min    Equipment Utilized During Treatment Gait belt    Activity Tolerance Patient tolerated treatment well    Behavior During Therapy [facility] for tasks assessed/performed                 Past Medical History:  Diagnosis Date   Anemia    Anxiety    Arthritis    L HIP   Asthma    Avascular necrosis of hip ([place])    LEFT   Back pain    Chest pain    Constipation    Depression    Diabetes mellitus    Diabetes mellitus, type II ([place])    Dry cough    Edema, lower extremity    Gait abnormality 11/18/2019   GERD (gastroesophageal reflux disease)    High cholesterol    Hypertension    Insomnia    takes Ambien nightly   Joint pain    Kidney disease    Kidney disease    Obesity    Parkinsonism 03/02/2017   PONV (postoperative nausea and vomiting)    RLS (restless legs syndrome) 01/15/2018   Sleep apnea    Swallowing difficulty    Thyroid disease    TIA (transient ischemic attack) 2012   no residual problems   Urgency incontinence    Vitamin D deficiency    Past Surgical History:  Procedure Laterality Date   DILATION AND CURETTAGE OF UTERUS     ESOPHAGEAL MANOMETRY N/A 09/03/2012   Procedure: ESOPHAGEAL MANOMETRY (EM);  Surgeon: Garlan Fair, MD;  Location: [facility];  Service: Endoscopy;  Laterality: N/A;   ESOPHAGEAL MANOMETRY N/A 06/19/2017   Procedure: ESOPHAGEAL MANOMETRY (EM);  Surgeon: Clarene Essex, MD;  Location: [facility];  Service:  Endoscopy;  Laterality: N/A;   EXPLORATORY LAPAROTOMY     GANGLION CYST EXCISION     JOINT REPLACEMENT  2011   rt total hip   KNEE ARTHROSCOPY     PILONIDAL CYST / SINUS EXCISION     TOTAL HIP ARTHROPLASTY Left 10/09/2013   Procedure: LEFT TOTAL HIP ARTHROPLASTY ANTERIOR APPROACH;  Surgeon: [name], MD;  Location: [facility];  Service: Orthopedics;  Laterality: Left;   Patient Active Problem List   Diagnosis Date Noted   Allergic conjunctivitis of both eyes 07/06/2020   Gait abnormality 11/18/2019   OSA on CPAP 06/04/2019   Pain in joint of left shoulder 03/28/2019   Seasonal allergic conjunctivitis 02/07/2019   Chronic intermittent hypoxia with obstructive sleep apnea 01/25/2019   Severe obstructive sleep apnea 01/25/2019   Excessive daytime sleepiness 01/03/2019   Non-restorative sleep 01/03/2019   Nocturia more than twice per night 01/03/2019   Weight gain, abnormal 01/03/2019   [place] 01/03/2019   Trochanteric bursitis of right hip 04/27/2018   Encounter for routine screening for malformation using ultrasonics 02/08/2018   Abnormal weight gain 02/08/2018  Acid indigestion 02/08/2018   Acute antritis 02/08/2018   Class 2 severe obesity with serious comorbidity and body mass index (BMI) of 39.0 to 39.9 in adult ([place]) 02/08/2018   Perennial allergic rhinitis 02/08/2018   Antiphospholipid syndrome ([place]) 02/08/2018   Arthralgia of hip or thigh 02/08/2018   Asthma, cough variant 02/08/2018   Vitamin D deficiency 02/08/2018   Backhand tennis elbow 02/08/2018   Cannot sleep 02/08/2018   Chronic kidney disease (CKD), stage III (moderate) ([place]) 02/08/2018   Current drug use 02/08/2018   Depression, major, single episode, in partial remission ([place]) 02/08/2018   Diabetes mellitus with polyneuropathy ([place]) 02/08/2018   Difficulty hearing 02/08/2018   Disturbance of skin sensation 02/08/2018   Hypertension associated with diabetes ([place]) 02/08/2018   Extremity pain 02/08/2018    Facet syndrome, lumbar 02/08/2018   Factor V Leiden ([place]) 02/08/2018   Fungal infection of nail 02/08/2018   Gastroenteritis and colitis, viral 02/08/2018   Hypoglycemia 02/08/2018   Menopause 02/08/2018   Other long term (current) drug therapy 02/08/2018   Other osteonecrosis, unspecified femur ([place]) 02/08/2018   Pure hypercholesterolemia 02/08/2018   Recurrent major depressive episodes ([place]) 02/08/2018   Syncope and collapse 02/08/2018   Not well controlled moderate persistent asthma 02/08/2018   Cough, persistent 02/08/2018   Restless leg syndrome 01/15/2018   Degeneration of lumbar intervertebral disc 11/21/2017   Tremor of right hand 03/02/2017   Parkinson disease 03/02/2017   UTI (urinary tract infection) 12/01/2015   Type 2 diabetes mellitus with stage 3b chronic kidney disease, with long-term current use of insulin ([place]) 06/03/2015   Fast heart beat 04/09/2015   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 2 diabetes mellitus ([place]) 04/24/2014   Postoperative anemia due to acute blood loss 10/10/2013   Avascular necrosis of bone of left hip ([place]) 10/09/2013   Arthritis of pelvic region, degenerative 10/09/2013   Breath shortness 06/18/2013   Diabetes mellitus type 2, uncontrolled 05/31/2013   Intermittent claudication ([place]) 04/04/2013   Anxiety    Gastroesophageal reflux disease 09/12/2012   Clinical depression 02/21/2012   Stroke ([place]) 02/11/2012   History of revision of total replacement of right hip joint 04/04/2009    ONSET DATE: 12/02/2021   REFERRING DIAG: G20 (ICD-10-CM) - Parkinson disease ([place]) R29.6 (ICD-10-CM) - Falls frequently   THERAPY DIAG:  Unsteadiness on feet  Muscle weakness (generalized)  Other symptoms and signs involving the nervous system  Rationale for Evaluation and Treatment Rehabilitation  SUBJECTIVE:  SUBJECTIVE STATEMENT: No changes, leaves for her trip on Wednesday. Feels like she is able to move better.   Pt accompanied by: self  PERTINENT HISTORY: Parkinsonism (2018),TIA, thyroid disease, kidney disease, HTN, HLD, GERD, DMII, depression, L hip avascular necrosis, anxiety, anemia, R THA 2011, L THA 2015, knee arthroscopy   PAIN:  Are you having pain? Yes: NPRS scale: 5/10 Pain location: R foot Pain description: Sharp  Relieving factors: Sitting down Aggravating factors: Getting up and walking   PRECAUTIONS: Fall, No driving.   FALLS: Has patient fallen in last 6 months? Yes. Number of falls 10  VITALS There were no vitals filed for this visit.   PLOF: Independent with community mobility with device  PATIENT GOALS Wants to stop falling, improve balance.    OBJECTIVE:   COGNITION: Overall cognitive status: Impaired  TODAY'S TREATMENT:   Therapeutic Exercise: SciFit with BUE/BLE for strengthening, ROM, reciprocal movement patterns at level 2.5 for 8 minutes. Cues for full ROM and incr knee extension esp with RLE. Pt able to maintain spm >75   NMR: With wide BOS on foam beam, trunk rotations, reaching across body outside of BOS to grab cone and then perform lateral weight shifting to hand it to therapist on other side, x10 reps each direction, cued to turn head when looking when grabbing cone.  With 2nd PT providing resistance using resisted belt at pelvis (doing steady resistance and then random perturbations)- alternating forward stepping over 4" obstacle for SLS/stepping strategy x15 reps each side with progressing difficulty by cognitive challenge by naming any kind of animal. Performed same activity with lateral stepping over 2" obstacle, x15 reps each direction, progressing to naming animals in ocean then animals in [place]. Pt challenged by naming and taking incr time to step. Cued for full weight shift when stepping over obstacle. Pt  challenged by taking a step laterally to maintain balance, tendency to grab onto // bars.  On air ex with wide BOS, PWR [place] with reaching up towards sticky note target and then lifting contralateral leg for dynamic SLS x12 reps each side, pt more challenged with SLS towards the R.  On air ex with EC with feet apart; 4 x 15-20 seconds, pt with tendency to lose balance anteriorly, with trunk lean to the L.  On air ex x10 reps heel toe raises slow for A/P weight shifting    GAIT: Gait pattern: decreased arm swing- Left, decreased stride length, Right foot flat, Left foot flat, decreased trunk rotation, trunk flexed, and narrow BOS Distance walked: Clinic distances. Assistive device utilized: Single point cane and None Level of assistance: SBA Comments: Min cues for wider BOS and facilitation of arm swing and incr step length bilat.    PATIENT EDUCATION: Education details: Continue with HEP.  Person educated: Patient Education method: Explanation Education comprehension: verbalized understanding and needs further education   HOME EXERCISE PROGRAM: Access Code: [email or web address] URL: [email or web address] Date: 12/22/2021 Prepared by: Mickie Bail Plaster  Exercises - Sit to Stand Without Arm Support  - 1 x daily - 7 x weekly - 2 sets - 5-6 reps - Heel Raises with Counter Support  - 1 x daily - 7 x weekly - 3 sets - 10 reps - Step sideways with arms reaching at counter   - 1 x daily - 7 x weekly - 3 sets - 10 reps - Alternating Step Backward with Support  - 1 x daily - 7 x weekly - 3 sets - 10 reps - Standing Quarter Turn  with Counter Support  - 1 x daily - 7 x weekly - 3 sets - 10 reps - Seated Hamstring Stretch  - 1 x daily - 7 x weekly - 1 sets - 3-4 reps - 30 second hold - Calf stretch  - 1 x daily - 7 x weekly - 1 sets - 3-4 reps - 30-45 second hold  Standing PWR moves    GOALS: Goals reviewed with patient? Yes  SHORT TERM GOALS: Target date: 01/12/2022  Pt will be  independent with initial HEP in order to build upon functional gains made in therapy.  Baseline: Goal status: MET  2.  Pt will improve MiniBest to 19/28 for decreased fall risk and improvement with compensatory stepping strategies.   Baseline: 16/28; 19/28  Goal status: MET  3.  Pt will improve 5x sit<>stand to less than or equal to 24 sec with no UE support to demonstrate improved functional strength and transfer efficiency.  Baseline: 28.59 seconds with no UE support; 13.21s without UE support Goal status: MET  4.  Pt will improve gait speed with LRAD to at least 2.3 ft/sec in order to demo improved community mobility.  Baseline: 16.58 seconds = 1.95 ft/sec with SPC; 2.63f/s w/SPC Goal status: MET  5.  Pt will improve manual TUG to 14 seconds or less in order to decr fall risk. Baseline: 16 seconds with SPC; 21.93s w/SPC Goal status: NOT MET   LONG TERM GOALS: Target date: 02/09/2022  Pt will be independent with final HEP for PD specific deficits/balance in order to build upon functional gains made in therapy. Baseline:  Goal status: INITIAL  2.  Pt will improve MiniBest to 23/28 for decreased fall risk and improvement with compensatory stepping strategies.   Baseline: 16/28 Goal status: REVISED  3.  Pt will improve gait speed with LRAD to at least 2.8 ft/sec in order to demo improved community mobility.  Baseline: 16.58 seconds = 1.95 ft/sec with SPC; 2.5 ft/s w/ SPC on 10/10  Goal status: REVISED   4.  Pt will improve 5x sit<>stand to less than or equal to 18 sec with no UE support to demonstrate improved functional strength and transfer efficiency.  Baseline: 28.59 seconds with no UE support; 13.21s on 10/10  Goal status: MET  5.  Pt will verbalize understanding of local Parkinson's disease resources, including options for continued community fitness. Baseline:  Goal status: INITIAL    ASSESSMENT:  CLINICAL IMPRESSION: Today's skilled session focused on BLE  strength/endurance and balance strategies on compliant surfaces with EC, weight shifting, SLS, and on stepping strategies. Pt challenged by tasks with EC, indicating decr vestibular input for balance. With resisted stepping, pt with difficulty initiating a step when losing balance, has tendency to grab onto // bars instead. [place]. Pt plans to bring her rollator with her. Will continue per POC when pt returns.      OBJECTIVE IMPAIRMENTS Abnormal gait, decreased activity tolerance, decreased balance, decreased coordination, difficulty walking, decreased strength, impaired flexibility, impaired tone, and postural dysfunction.   ACTIVITY LIMITATIONS bending, squatting, stairs, transfers, and locomotion level  PARTICIPATION LIMITATIONS: driving and community activity  PERSONAL FACTORS Behavior pattern, Past/current experiences, Time since onset of injury/illness/exacerbation, and 3+ comorbidities: Parkinsonism (2018),TIA, thyroid disease, kidney disease, HTN, HLD, GERD, DMII, depression, L hip avascular necrosis, anxiety, anemia, R THA 2011, L THA 2015, knee arthroscopy   are also affecting patient's functional outcome.  REHAB POTENTIAL: Good  CLINICAL DECISION MAKING: Evolving/moderate complexity  EVALUATION COMPLEXITY: Moderate  PLAN: PT FREQUENCY: 2x/week  PT DURATION: 12 weeks  PLANNED INTERVENTIONS: Therapeutic exercises, Therapeutic activity, Neuromuscular re-education, Balance training, Gait training, Patient/Family education, Self Care, Stair training, Vestibular training, DME instructions, Manual therapy, and Re-evaluation  PLAN FOR NEXT SESSION: Work on stepping strategies, functional strength. Sit <>stands on wedge, scifit for endurance,forward lunge over beam, reaching out of BOS, resisted walking, sit <>stands w/slam ball, treadmill? Review standing PWR moves as needed. Dual tasking   Arliss Journey, PT, DPT   01/14/2022, 12:06 PM

## 2022-01-17 ENCOUNTER — Other Ambulatory Visit (HOSPITAL_BASED_OUTPATIENT_CLINIC_OR_DEPARTMENT_OTHER): Payer: Self-pay

## 2022-01-17 MED ORDER — AMLODIPINE BESYLATE 5 MG PO TABS
5.0000 mg | ORAL_TABLET | Freq: Every day | ORAL | 0 refills | Status: DC
  Filled 2022-02-02: qty 90, 90d supply, fill #0

## 2022-01-17 NOTE — Patient Instructions (Incomplete)
Asthma Continue Symbicort 160/4.5 mcg to-2 puffs twice a day with a spacer to prevent cough or wheeze.  Make sure and use this every day even though you are not having symptoms.  We can set a reminder on a phone or set it next to your toothbrush as a reminder Continue albuterol 2 puffs every 4 hours as needed for cough, wheeze, tightness in chest, or shortness of breath Continue Xolair injections 300 mg once every 4 weeks and have access to an epinephrine auto-injector set.  Refill of her EpiPen sent.    Allergic rhinitis Continue Nasacort 2 sprays in each nostril once a day as needed for stuffy nose Continue saline nasal rinses as needed for nasal symptoms. Use this before any medicated nasal sprays for best result Continue azelastine nasal spray 2 sprays in each nostril twice a day as needed for nasal symptoms May use Xyzal 5 mg once a day only as needed for a runny nose or itch Continue allergen avoidance measures directed toward dust mites, cat, and dog as listed below For thick post nasal drainage, begin Mucinex [phone] mg twice a day and increase your fluid intake in order to thin out mucus  Allergic conjunctivitis Some over the counter eye drops include Pataday one drop in each eye once a day as needed for red, itchy eyes OR Zaditor one drop in each eye twice a day as needed for red itchy eyes  Reflux Continue Pepcid as previously prescribed.  Contact your GI doctor about other options since Pepcid is not working and you have tried proton pump inhibitors in the past Continue dietary and lifestyle modifications   Please let us know if this treatment plan is not working well for you  Follow up in  weeks or sooner if needed.  Control of Dog or Cat Allergen Avoidance is the best way to manage a dog or cat allergy. If you have a dog or cat and are allergic to dog or cats, consider removing the dog or cat from the home. If you have a dog or cat but don't want to find it a new home, or if  your family wants a pet even though someone in the household is allergic, here are some strategies that may help keep symptoms at bay:  Keep the pet out of your bedroom and restrict it to only a few rooms. Be advised that keeping the dog or cat in only one room will not limit the allergens to that room. Don't pet, hug or kiss the dog or cat; if you do, wash your hands with soap and water. High-efficiency particulate air (HEPA) cleaners run continuously in a bedroom or living room can reduce allergen levels over time. Regular use of a high-efficiency vacuum cleaner or a central vacuum can reduce allergen levels. Giving your dog or cat a bath at least once a week can reduce airborne allergen.   Control of Dust Mite Allergen Dust mites play a major role in allergic asthma and rhinitis. They occur in environments with high humidity wherever human skin is found. Dust mites absorb humidity from the atmosphere (ie, they do not drink) and feed on organic matter (including shed human and animal skin). Dust mites are a microscopic type of insect that you cannot see with the naked eye. High levels of dust mites have been detected from mattresses, pillows, carpets, upholstered furniture, bed covers, clothes, soft toys and any woven material. The principal allergen of the dust mite is found in its feces.  A gram of dust may contain 1,000 mites and 250,000 fecal particles. Mite antigen is easily measured in the air during house cleaning activities. Dust mites do not bite and do not cause harm to humans, other than by triggering allergies/asthma.  Ways to decrease your exposure to dust mites in your home:  1. Encase mattresses, box springs and pillows with a mite-impermeable barrier or cover  2. Wash sheets, blankets and drapes weekly in hot water (130 F) with detergent and dry them in a dryer on the hot setting.  3. Have the room cleaned frequently with a vacuum cleaner and a damp dust-mop. For carpeting or  rugs, vacuuming with a vacuum cleaner equipped with a high-efficiency particulate air (HEPA) filter. The dust mite allergic individual should not be in a room which is being cleaned and should wait 1 hour after cleaning before going into the room.  4. Do not sleep on upholstered furniture (eg, couches).  5. If possible removing carpeting, upholstered furniture and drapery from the home is ideal. Horizontal blinds should be eliminated in the rooms where the person spends the most time (bedroom, study, television room). Washable vinyl, roller-type shades are optimal.  6. Remove all non-washable stuffed toys from the bedroom. Wash stuffed toys weekly like sheets and blankets above.  7. Reduce indoor humidity to less than 50%. Inexpensive humidity monitors can be purchased at most hardware stores. Do not use a humidifier as can make the problem worse and are not recommended.

## 2022-01-18 ENCOUNTER — Encounter: Payer: Self-pay | Admitting: Family

## 2022-01-18 ENCOUNTER — Other Ambulatory Visit (HOSPITAL_BASED_OUTPATIENT_CLINIC_OR_DEPARTMENT_OTHER): Payer: Self-pay

## 2022-01-18 ENCOUNTER — Ambulatory Visit (INDEPENDENT_AMBULATORY_CARE_PROVIDER_SITE_OTHER): Payer: 59 | Admitting: Family

## 2022-01-18 VITALS — BP 102/70 | HR 67 | Temp 97.7°F | Resp 20 | Wt 216.0 lb

## 2022-01-18 DIAGNOSIS — K219 Gastro-esophageal reflux disease without esophagitis: Secondary | ICD-10-CM | POA: Diagnosis not present

## 2022-01-18 DIAGNOSIS — H1013 Acute atopic conjunctivitis, bilateral: Secondary | ICD-10-CM | POA: Diagnosis not present

## 2022-01-18 DIAGNOSIS — J4540 Moderate persistent asthma, uncomplicated: Secondary | ICD-10-CM

## 2022-01-18 DIAGNOSIS — J3089 Other allergic rhinitis: Secondary | ICD-10-CM | POA: Diagnosis not present

## 2022-01-18 MED ORDER — COMIRNATY 30 MCG/0.3ML IM SUSY
PREFILLED_SYRINGE | INTRAMUSCULAR | 0 refills | Status: DC
  Filled 2022-01-18: qty 0.3, 1d supply, fill #0

## 2022-01-18 MED ORDER — OMEPRAZOLE 20 MG PO CPDR
20.0000 mg | DELAYED_RELEASE_CAPSULE | Freq: Every day | ORAL | 0 refills | Status: DC
  Filled 2022-01-18: qty 30, 30d supply, fill #0

## 2022-01-18 NOTE — Progress Notes (Signed)
[place] [id number] Dept: [phone]  FOLLOW UP NOTE  Patient ID: Deanna Rowe, female    DOB: 07-11-1955  Age: 66 y.o. MRN: [id number] Date of Office Visit: 01/18/2022  Assessment  Chief Complaint: No chief complaint on file.  HPI Deanna Rowe is a 66 year old female who presents today for for follow-up of not well controlled moderate persistent asthma, productive cough, perennial allergic rhinitis, allergic conjunctivitis, gastroesophageal reflux disease.  She was last seen on November 11, 2021 by myself.  Since her last office visit she reports that she fell approximately 3 weeks ago due to her having Parkinson's.  She did not go to the emergency room.  Moderate persistent asthma: She reports that her cough has been gone for approximately 8 weeks now.  She will have wheezing sometimes at night, shortness of breath sometimes, and nocturnal awakenings due to breathing problems sometimes.  She denies tightness in her chest.  Since her last office visit she has not required any systemic steroids or made any trips to the emergency room or urgent care due to breathing problems.  She uses her albuterol inhaler maybe once a week.  She is currently using Symbicort 160/4.5 mcg 2 puffs twice a day with a spacer and Xolair injections 300 mg every 4 weeks.  She does have an up-to-date epinephrine autoinjector device.  She did stop montelukast 10 mg after her last visit due to not being able to tell a difference while taking.  She did have a chest x-ray on November 11, 2021 showing "no active cardiopulmonary disease."  Allergic rhinitis: She reports that she used azelastine 1 time and that is when the cough stopped.  She also uses Nasacort nasal spray as needed.  She does not think she is taking an over-the-counter antihistamine.  She denies rhinorrhea, nasal congestion, and postnasal drip.  She has not been treated for any other sinus infections since her last office visit.  She does  mention that the antibiotic we gave her at her last office visit did help clear up her symptoms.  Allergic conjunctivitis.  She reports her right eye will be watery sometimes.  She will use Pataday eyedrops and this helps.  Reflux is reported as not well controlled.  She reports that she has reflux symptoms all the time.  Her symptoms occur daily.  She continues to take Pepcid in the morning.  She has not yet contacted her GI doctor to schedule a follow-up appointment.  She is interested in stopping Pepcid and trying a different medication.     Drug Allergies:  Allergies  Allergen Reactions   Fluticasone-Salmeterol Anaphylaxis   Codeine Nausea And Vomiting   Fetzima [Levomilnacipran] Nausea And Vomiting   Nsaids Other (See Comments)    Upset stomach    Trulicity [Dulaglutide] Nausea And Vomiting    Significant and undesirably rapid (40 lbs) weight loss    Xanax [Alprazolam] Other (See Comments)    disoriented   Amoxicillin Rash    Has patient had a PCN reaction causing immediate rash, facial/tongue/throat swelling, SOB or lightheadedness with hypotension: Yes Has patient had a PCN reaction causing severe rash involving mucus membranes or skin necrosis: No Has patient had a PCN reaction that required hospitalization: No Has patient had a PCN reaction occurring within the last 10 years: No If all of the above answers are "NO", then may proceed with Cephalosporin use.    Pseudoephedrine Hcl Er Palpitations    Review of Systems: Review  of Systems  Constitutional:  Negative for chills and fever.  HENT:         Denies rhinorrhea, nasal congestion, and postnasal drip  Cardiovascular:  Positive for palpitations. Negative for chest pain.       She reports palpitations earlier today and that is been a while since she has had palpitations.  She does have a cardiologist.  Instructed her to contact her cardiologist should her palpitations recur  Gastrointestinal:        Reports reflux  symptoms daily while taking Pepcid in the morning  Genitourinary:  Negative for frequency.  Skin:  Negative for itching and rash.  Neurological:  Positive for headaches.  Endo/Heme/Allergies:  Positive for environmental allergies.     Physical Exam: BP 102/70 (BP Location: Right Arm, Patient Position: Sitting, Cuff Size: Normal)   Pulse 67   Temp 97.7 F (36.5 C) (Oral)   Resp 20   Wt 216 lb (98 kg)   SpO2 98%   BMI 34.86 kg/m    Physical Exam Constitutional:      Appearance: Normal appearance.  HENT:     Head: Normocephalic and atraumatic.     Comments: Pharynx normal, eyes normal, ears normal, nose normal    Right Ear: Tympanic membrane, ear canal and external ear normal.     Left Ear: Tympanic membrane, ear canal and external ear normal.     Nose: Nose normal.     Mouth/Throat:     Mouth: Mucous membranes are moist.     Pharynx: Oropharynx is clear.  Eyes:     Conjunctiva/sclera: Conjunctivae normal.  Cardiovascular:     Rate and Rhythm: Normal rate and regular rhythm.     Heart sounds: Normal heart sounds.  Pulmonary:     Effort: Pulmonary effort is normal.     Breath sounds: Normal breath sounds.     Comments: Lungs clear to auscultation Musculoskeletal:     Cervical back: Neck supple.  Skin:    General: Skin is warm.  Neurological:     Mental Status: She is alert and oriented to person, place, and time.  Psychiatric:        Mood and Affect: Mood normal.        Behavior: Behavior normal.        Thought Content: Thought content normal.        Judgment: Judgment normal.     Diagnostics:  None. Will get at next office visit.  Assessment and Plan: 1. Moderate persistent asthma without complication   2. Gastroesophageal reflux disease, unspecified whether esophagitis present   3. Perennial allergic rhinitis   4. Allergic conjunctivitis of both eyes     Meds ordered this encounter  Medications   omeprazole (PRILOSEC) 20 MG capsule    Sig: Take one  capsule daily 30 minutes prior to dinner.    Dispense:  30 capsule    Refill:  0    Patient Instructions  Asthma Continue Symbicort 160/4.5 mcg to-2 puffs twice a day with a spacer to prevent cough or wheeze.  Make sure and use this every day even though you are not having symptoms.  We can set a reminder on a phone or set it next to your toothbrush as a reminder Continue albuterol 2 puffs every 4 hours as needed for cough, wheeze, tightness in chest, or shortness of breath Continue Xolair injections 300 mg once every 4 weeks and have access to an epinephrine auto-injector set.    Allergic rhinitis Continue  Nasacort 2 sprays in each nostril once a day as needed for stuffy nose Continue saline nasal rinses as needed for nasal symptoms. Use this before any medicated nasal sprays for best result Continue azelastine nasal spray 2 sprays in each nostril twice a day as needed for drainage down throat/runny nose May use Xyzal 5 mg once a day only as needed for a runny nose or itch Continue allergen avoidance measures directed toward dust mites, cat, and dog as listed below For thick post nasal drainage, begin Mucinex [phone] mg twice a day and increase your fluid intake in order to thin out mucus  Allergic conjunctivitis Some over the counter eye drops include Pataday one drop in each eye once a day as needed for red, itchy eyes OR Zaditor one drop in each eye twice a day as needed for red itchy eyes  Reflux Stop Pepcid (famotidine)  Lets start a 30 day trial of omeprazole 20 mg once a day. Take this 30 minutes before dinner. Call me with an update on your reflux symptoms after starting omeprazole Recommend scheduling an appointment with your GI doctor due to your frequent reflux symptoms Continue dietary and lifestyle modifications   Please let us know if this treatment plan is not working well for you  Follow up in 2 months or sooner if needed.  Control of Dog or Cat Allergen Avoidance  is the best way to manage a dog or cat allergy. If you have a dog or cat and are allergic to dog or cats, consider removing the dog or cat from the home. If you have a dog or cat but don't want to find it a new home, or if your family wants a pet even though someone in the household is allergic, here are some strategies that may help keep symptoms at bay:  Keep the pet out of your bedroom and restrict it to only a few rooms. Be advised that keeping the dog or cat in only one room will not limit the allergens to that room. Don't pet, hug or kiss the dog or cat; if you do, wash your hands with soap and water. High-efficiency particulate air (HEPA) cleaners run continuously in a bedroom or living room can reduce allergen levels over time. Regular use of a high-efficiency vacuum cleaner or a central vacuum can reduce allergen levels. Giving your dog or cat a bath at least once a week can reduce airborne allergen.   Control of Dust Mite Allergen Dust mites play a major role in allergic asthma and rhinitis. They occur in environments with high humidity wherever human skin is found. Dust mites absorb humidity from the atmosphere (ie, they do not drink) and feed on organic matter (including shed human and animal skin). Dust mites are a microscopic type of insect that you cannot see with the naked eye. High levels of dust mites have been detected from mattresses, pillows, carpets, upholstered furniture, bed covers, clothes, soft toys and any woven material. The principal allergen of the dust mite is found in its feces. A gram of dust may contain 1,000 mites and 250,000 fecal particles. Mite antigen is easily measured in the air during house cleaning activities. Dust mites do not bite and do not cause harm to humans, other than by triggering allergies/asthma.  Ways to decrease your exposure to dust mites in your home:  1. Encase mattresses, box springs and pillows with a mite-impermeable barrier or cover  2.  Wash sheets, blankets and drapes weekly in  hot water (130 F) with detergent and dry them in a dryer on the hot setting.  3. Have the room cleaned frequently with a vacuum cleaner and a damp dust-mop. For carpeting or rugs, vacuuming with a vacuum cleaner equipped with a high-efficiency particulate air (HEPA) filter. The dust mite allergic individual should not be in a room which is being cleaned and should wait 1 hour after cleaning before going into the room.  4. Do not sleep on upholstered furniture (eg, couches).  5. If possible removing carpeting, upholstered furniture and drapery from the home is ideal. Horizontal blinds should be eliminated in the rooms where the person spends the most time (bedroom, study, television room). Washable vinyl, roller-type shades are optimal.  6. Remove all non-washable stuffed toys from the bedroom. Wash stuffed toys weekly like sheets and blankets above.  7. Reduce indoor humidity to less than 50%. Inexpensive humidity monitors can be purchased at most hardware stores. Do not use a humidifier as can make the problem worse and are not recommended.  Lifestyle Changes for Controlling GERD When you have GERD, stomach acid feels as if it's backing up toward your mouth. Whether or not you take medication to control your GERD, your symptoms can often be improved with lifestyle changes.   Raise Your Head Reflux is more likely to strike when you're lying down flat, because stomach fluid can flow backward more easily. Raising the head of your bed 4-6 inches can help. To do this: Slide blocks or books under the legs at the head of your bed. Or, place a wedge under the mattress. Many foam stores can make a suitable wedge for you. The wedge should run from your waist to the top of your head. Don't just prop your head on several pillows. This increases pressure on your stomach. It can make GERD worse.  Watch Your Eating Habits Certain foods may increase the acid in  your stomach or relax the lower esophageal sphincter, making GERD more likely. It's best to avoid the following: Coffee, tea, and carbonated drinks (with and without caffeine) Fatty, fried, or spicy food Mint, chocolate, onions, and tomatoes Any other foods that seem to irritate your stomach or cause you pain  Relieve the Pressure Eat smaller meals, even if you have to eat more often. Don't lie down right after you eat. Wait a few hours for your stomach to empty. Avoid tight belts and tight-fitting clothes. Lose excess weight.  Tobacco and Alcohol Avoid smoking tobacco and drinking alcohol. They can make GERD symptoms worse.   Return in about 2 months (around 03/20/2022), or if symptoms worsen or fail to improve.    Thank you for the opportunity to care for this patient.  Please do not hesitate to contact me with questions.  Althea Charon, FNP [facility]

## 2022-01-19 ENCOUNTER — Other Ambulatory Visit (HOSPITAL_COMMUNITY): Payer: Self-pay

## 2022-01-25 ENCOUNTER — Other Ambulatory Visit: Payer: 59

## 2022-02-02 ENCOUNTER — Other Ambulatory Visit (HOSPITAL_COMMUNITY): Payer: Self-pay

## 2022-02-02 ENCOUNTER — Other Ambulatory Visit: Payer: Self-pay | Admitting: Neurology

## 2022-02-02 ENCOUNTER — Other Ambulatory Visit (HOSPITAL_BASED_OUTPATIENT_CLINIC_OR_DEPARTMENT_OTHER): Payer: Self-pay

## 2022-02-02 MED ORDER — CARBIDOPA-LEVODOPA ER 50-200 MG PO TBCR
1.0000 | EXTENDED_RELEASE_TABLET | Freq: Every day | ORAL | 0 refills | Status: DC
  Filled 2022-02-02: qty 450, fill #0
  Filled 2022-05-19: qty 450, 90d supply, fill #0

## 2022-02-03 ENCOUNTER — Ambulatory Visit: Payer: 59 | Attending: Adult Health | Admitting: Occupational Therapy

## 2022-02-03 ENCOUNTER — Ambulatory Visit: Payer: 59 | Admitting: Physical Therapy

## 2022-02-03 ENCOUNTER — Ambulatory Visit
Admission: RE | Admit: 2022-02-03 | Discharge: 2022-02-03 | Disposition: A | Discharge status: Home / Self Care | Payer: 59 | Source: Ambulatory Visit | Attending: Internal Medicine | Admitting: Internal Medicine

## 2022-02-03 ENCOUNTER — Other Ambulatory Visit (HOSPITAL_BASED_OUTPATIENT_CLINIC_OR_DEPARTMENT_OTHER): Payer: Self-pay

## 2022-02-03 ENCOUNTER — Other Ambulatory Visit (HOSPITAL_COMMUNITY): Payer: Self-pay

## 2022-02-03 DIAGNOSIS — E042 Nontoxic multinodular goiter: Secondary | ICD-10-CM

## 2022-02-03 DIAGNOSIS — M6281 Muscle weakness (generalized): Secondary | ICD-10-CM

## 2022-02-03 DIAGNOSIS — R41844 Frontal lobe and executive function deficit: Secondary | ICD-10-CM | POA: Insufficient documentation

## 2022-02-03 DIAGNOSIS — R293 Abnormal posture: Secondary | ICD-10-CM | POA: Insufficient documentation

## 2022-02-03 DIAGNOSIS — R2681 Unsteadiness on feet: Secondary | ICD-10-CM

## 2022-02-03 DIAGNOSIS — R29818 Other symptoms and signs involving the nervous system: Secondary | ICD-10-CM | POA: Insufficient documentation

## 2022-02-03 DIAGNOSIS — R278 Other lack of coordination: Secondary | ICD-10-CM | POA: Insufficient documentation

## 2022-02-03 DIAGNOSIS — R41840 Attention and concentration deficit: Secondary | ICD-10-CM | POA: Diagnosis not present

## 2022-02-03 DIAGNOSIS — R2689 Other abnormalities of gait and mobility: Secondary | ICD-10-CM

## 2022-02-03 DIAGNOSIS — E041 Nontoxic single thyroid nodule: Secondary | ICD-10-CM | POA: Diagnosis not present

## 2022-02-03 NOTE — Therapy (Signed)
OUTPATIENT PHYSICAL THERAPY NEURO TREATMENT   Patient Name: Deanna Rowe MRN: [id number] DOB:11/23/1955, 66 y.o., female Today's Date: 02/03/2022   PCP: Aretta Nip, MD REFERRING PROVIDER: Ward Givens, NP    PT End of Session - 02/03/22 1017     Visit Number 9    Number of Visits 17    Date for PT Re-Evaluation 03/15/22   due to delay in scheduling/pt going on vacation   Authorization Type [employer]    PT Start Time 1016   Handoff w/OT   PT Stop Time 1100    PT Time Calculation (min) 44 min    Equipment Utilized During Treatment Gait belt    Activity Tolerance Patient tolerated treatment well    Behavior During Therapy WFL for tasks assessed/performed                  Past Medical History:  Diagnosis Date   Anemia    Anxiety    Arthritis    L HIP   Asthma    Avascular necrosis of hip ([place])    LEFT   Back pain    Chest pain    Constipation    Depression    Diabetes mellitus    Diabetes mellitus, type II ([place])    Dry cough    Edema, lower extremity    Gait abnormality 11/18/2019   GERD (gastroesophageal reflux disease)    High cholesterol    Hypertension    Insomnia    takes Ambien nightly   Joint pain    Kidney disease    Kidney disease    Obesity    Parkinsonism 03/02/2017   PONV (postoperative nausea and vomiting)    RLS (restless legs syndrome) 01/15/2018   Sleep apnea    Swallowing difficulty    Thyroid disease    TIA (transient ischemic attack) 2012   no residual problems   Urgency incontinence    Vitamin D deficiency    Past Surgical History:  Procedure Laterality Date   DILATION AND CURETTAGE OF UTERUS     ESOPHAGEAL MANOMETRY N/A 09/03/2012   Procedure: ESOPHAGEAL MANOMETRY (EM);  Surgeon: Garlan Fair, MD;  Location: [facility];  Service: Endoscopy;  Laterality: N/A;   ESOPHAGEAL MANOMETRY N/A 06/19/2017   Procedure: ESOPHAGEAL MANOMETRY (EM);  Surgeon: Clarene Essex, MD;  Location: [facility];  Service:  Endoscopy;  Laterality: N/A;   EXPLORATORY LAPAROTOMY     GANGLION CYST EXCISION     JOINT REPLACEMENT  2011   rt total hip   KNEE ARTHROSCOPY     PILONIDAL CYST / SINUS EXCISION     TOTAL HIP ARTHROPLASTY Left 10/09/2013   Procedure: LEFT TOTAL HIP ARTHROPLASTY ANTERIOR APPROACH;  Surgeon: [name], MD;  Location: [facility];  Service: Orthopedics;  Laterality: Left;   Patient Active Problem List   Diagnosis Date Noted   Allergic conjunctivitis of both eyes 07/06/2020   Gait abnormality 11/18/2019   OSA on CPAP 06/04/2019   Pain in joint of left shoulder 03/28/2019   Seasonal allergic conjunctivitis 02/07/2019   Chronic intermittent hypoxia with obstructive sleep apnea 01/25/2019   Severe obstructive sleep apnea 01/25/2019   Excessive daytime sleepiness 01/03/2019   Non-restorative sleep 01/03/2019   Nocturia more than twice per night 01/03/2019   Weight gain, abnormal 01/03/2019   Snorings 01/03/2019   Trochanteric bursitis of right hip 04/27/2018   Encounter for routine screening for malformation using ultrasonics 02/08/2018   Abnormal weight gain 02/08/2018  Acid indigestion 02/08/2018   Acute antritis 02/08/2018   Class 2 severe obesity with serious comorbidity and body mass index (BMI) of 39.0 to 39.9 in adult ([place]) 02/08/2018   Perennial allergic rhinitis 02/08/2018   Antiphospholipid syndrome ([place]) 02/08/2018   Arthralgia of hip or thigh 02/08/2018   Asthma, cough variant 02/08/2018   Vitamin D deficiency 02/08/2018   Backhand tennis elbow 02/08/2018   Cannot sleep 02/08/2018   Chronic kidney disease (CKD), stage III (moderate) ([place]) 02/08/2018   Current drug use 02/08/2018   Depression, major, single episode, in partial remission ([place]) 02/08/2018   Diabetes mellitus with polyneuropathy ([place]) 02/08/2018   Difficulty hearing 02/08/2018   Disturbance of skin sensation 02/08/2018   Hypertension associated with diabetes ([place]) 02/08/2018   Extremity pain 02/08/2018    Facet syndrome, lumbar 02/08/2018   Factor V Leiden ([place]) 02/08/2018   Fungal infection of nail 02/08/2018   Gastroenteritis and colitis, viral 02/08/2018   Hypoglycemia 02/08/2018   Menopause 02/08/2018   Other long term (current) drug therapy 02/08/2018   Other osteonecrosis, unspecified femur ([place]) 02/08/2018   Pure hypercholesterolemia 02/08/2018   Recurrent major depressive episodes ([place]) 02/08/2018   Syncope and collapse 02/08/2018   Not well controlled moderate persistent asthma 02/08/2018   Cough, persistent 02/08/2018   Restless leg syndrome 01/15/2018   Degeneration of lumbar intervertebral disc 11/21/2017   Tremor of right hand 03/02/2017   Parkinson disease 03/02/2017   UTI (urinary tract infection) 12/01/2015   Type 2 diabetes mellitus with stage 3b chronic kidney disease, with long-term current use of insulin ([place]) 06/03/2015   Fast heart beat 04/09/2015   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 2 diabetes mellitus ([place]) 04/24/2014   Postoperative anemia due to acute blood loss 10/10/2013   Avascular necrosis of bone of left hip ([place]) 10/09/2013   Arthritis of pelvic region, degenerative 10/09/2013   Breath shortness 06/18/2013   Diabetes mellitus type 2, uncontrolled 05/31/2013   Intermittent claudication ([place]) 04/04/2013   Anxiety    Gastroesophageal reflux disease 09/12/2012   Clinical depression 02/21/2012   Stroke ([place]) 02/11/2012   History of revision of total replacement of right hip joint 04/04/2009    ONSET DATE: 12/02/2021   REFERRING DIAG: G20 (ICD-10-CM) - Parkinson disease ([place]) R29.6 (ICD-10-CM) - Falls frequently   THERAPY DIAG:  Unsteadiness on feet  Muscle weakness (generalized)  Other lack of coordination  Other abnormalities of gait and mobility  Rationale for Evaluation and Treatment Rehabilitation  SUBJECTIVE:  SUBJECTIVE STATEMENT: Pt reports she had 2 falls and one near miss on her trip. All falls were in posterior direction, no injuries reported. Pt required assistance to get up from floor. Has not done many exercises.   Pt accompanied by: self  PERTINENT HISTORY: Parkinsonism (2018),TIA, thyroid disease, kidney disease, HTN, HLD, GERD, DMII, depression, L hip avascular necrosis, anxiety, anemia, R THA 2011, L THA 2015, knee arthroscopy   PAIN:  Are you having pain? No Relieving factors: Sitting down Aggravating factors: Getting up and walking   PRECAUTIONS: Fall, No driving.   FALLS: Has patient fallen in last 6 months? Yes. Number of falls 10  VITALS There were no vitals filed for this visit.   PLOF: Independent with community mobility with device  PATIENT GOALS Wants to stop falling, improve balance.    OBJECTIVE:   COGNITION: Overall cognitive status: Impaired  TODAY'S TREATMENT:  Ther Ex SciFit multi-peaks level 6 for 8 minutes using BUE/BLEs for neural priming for reciprocal movement, dynamic cardiovascular warmup and increased amplitude of stepping. RPE of 7/10 following activity.    NMR  At counter top, alt retro step w/stomp and no UE support for improved retro stepping strategy and alternating sequence, x10 per side. Pt unable to maintain stomp and reported 6/10 RPE with activity. Added to HEP (see bolded below) with BUE support to continue retro stepping strategies, as all of pt's falls are in retro direction and pt unable to elicit reactive stepping.  Staggered stance deadlifts to 8" target using 10# KB to practice use of staggered stance when reaching, x8 per leg. Pt initially attempting to perform w/straight legs, min cues for knee flexion. Pt also attempting to lean against wall to perform (performed in corner), so min cues to reduce bracing. CGA throughout. Pt very fatigued following exercise and required seated rest  break.  At rebounder, alt retro step w/1KG ball throw/catch for improved retro stepping and priming of staggered stance w/reaching tasks. Placed chair on L side for safety, pt performed x12 per side w/CGA. Noted increased difficulty w/balance and sequencing movement when stepping w/RLE. Noted pt only catching/throwing ball w/RUE despite cues to use both. Also noted pt assumes very narrow BOS, so mod cues for wider BOS throughout for improved stability as pt frequently losing balance to L side. No posterior LOB noted.    GAIT: Gait pattern: decreased arm swing- Left, decreased stride length, Right foot flat, Left foot flat, decreased trunk rotation, trunk flexed, and narrow BOS Distance walked: Clinic distances. Assistive device utilized: Single point cane and None Level of assistance: SBA Comments: Min cues for wider BOS and facilitation of arm swing and incr step length bilat.    PATIENT EDUCATION: Education details: Continue with HEP.  Person educated: Patient Education method: Explanation Education comprehension: verbalized understanding and needs further education   HOME EXERCISE PROGRAM: Access Code: [email or web address] URL: [email or web address] Date: 12/22/2021 Prepared by: Mickie Bail Glorious Flicker  Exercises - Sit to Stand Without Arm Support  - 1 x daily - 7 x weekly - 2 sets - 5-6 reps - Heel Raises with Counter Support  - 1 x daily - 7 x weekly - 3 sets - 10 reps - Step sideways with arms reaching at counter   - 1 x daily - 7 x weekly - 3 sets - 10 reps - Alternating Step Backward with Support  - 1 x daily - 7 x weekly - 3 sets - 10 reps - Standing Quarter Turn with Counter Support  - 1 x  daily - 7 x weekly - 3 sets - 10 reps - Seated Hamstring Stretch  - 1 x daily - 7 x weekly - 1 sets - 3-4 reps - 30 second hold - Calf stretch  - 1 x daily - 7 x weekly - 1 sets - 3-4 reps - 30-45 second hold - Alternating Step Backward with Support  - 1 x daily - 7 x weekly - 3 sets - 10  reps  Standing PWR moves    GOALS: Goals reviewed with patient? Yes  SHORT TERM GOALS: Target date: 01/12/2022  Pt will be independent with initial HEP in order to build upon functional gains made in therapy.  Baseline: Goal status: MET  2.  Pt will improve MiniBest to 19/28 for decreased fall risk and improvement with compensatory stepping strategies.   Baseline: 16/28; 19/28  Goal status: MET  3.  Pt will improve 5x sit<>stand to less than or equal to 24 sec with no UE support to demonstrate improved functional strength and transfer efficiency.  Baseline: 28.59 seconds with no UE support; 13.21s without UE support Goal status: MET  4.  Pt will improve gait speed with LRAD to at least 2.3 ft/sec in order to demo improved community mobility.  Baseline: 16.58 seconds = 1.95 ft/sec with SPC; 2.89f/s w/SPC Goal status: MET  5.  Pt will improve manual TUG to 14 seconds or less in order to decr fall risk. Baseline: 16 seconds with SPC; 21.93s w/SPC Goal status: NOT MET   LONG TERM GOALS: Target date: 02/09/2022  Pt will be independent with final HEP for PD specific deficits/balance in order to build upon functional gains made in therapy. Baseline:  Goal status: INITIAL  2.  Pt will improve MiniBest to 23/28 for decreased fall risk and improvement with compensatory stepping strategies.   Baseline: 16/28 Goal status: REVISED  3.  Pt will improve gait speed with LRAD to at least 2.8 ft/sec in order to demo improved community mobility.  Baseline: 16.58 seconds = 1.95 ft/sec with SPC; 2.5 ft/s w/ SPC on 10/10  Goal status: REVISED   4.  Pt will improve 5x sit<>stand to less than or equal to 18 sec with no UE support to demonstrate improved functional strength and transfer efficiency.  Baseline: 28.59 seconds with no UE support; 13.21s on 10/10  Goal status: MET  5.  Pt will verbalize understanding of local Parkinson's disease resources, including options for continued  community fitness. Baseline:  Goal status: INITIAL    ASSESSMENT:  CLINICAL IMPRESSION: Emphasis of skilled PT session on retro stepping, stepping strategies and UE/LE coordination. Pt reports [place]. Encouraged pt to assume staggered stance position when reaching for objects, so practiced that position throughout session with no posterior LOB noted. Pt assumes very narrow BOS when in static stance, which pt unable to correct without cues. Continue POC.     OBJECTIVE IMPAIRMENTS Abnormal gait, decreased activity tolerance, decreased balance, decreased coordination, difficulty walking, decreased strength, impaired flexibility, impaired tone, and postural dysfunction.   ACTIVITY LIMITATIONS bending, squatting, stairs, transfers, and locomotion level  PARTICIPATION LIMITATIONS: driving and community activity  PERSONAL FACTORS Behavior pattern, Past/current experiences, Time since onset of injury/illness/exacerbation, and 3+ comorbidities: Parkinsonism (2018),TIA, thyroid disease, kidney disease, HTN, HLD, GERD, DMII, depression, L hip avascular necrosis, anxiety, anemia, R THA 2011, L THA 2015, knee arthroscopy   are also affecting patient's functional outcome.   REHAB POTENTIAL: Good  CLINICAL DECISION MAKING: Evolving/moderate complexity  EVALUATION COMPLEXITY: Moderate  PLAN: PT FREQUENCY: 2x/week  PT DURATION: 12 weeks  PLANNED INTERVENTIONS: Therapeutic exercises, Therapeutic activity, Neuromuscular re-education, Balance training, Gait training, Patient/Family education, Self Care, Stair training, Vestibular training, DME instructions, Manual therapy, and Re-evaluation  PLAN FOR NEXT SESSION: Work on stepping strategies (posterior), functional strength. Sit <>stands on wedge, scifit for endurance,forward lunge over beam, reaching out of BOS, resisted walking, sit <>stands w/slam ball, treadmill? Review standing PWR  moves as needed. Dual tasking   Cruzita Lederer Erek Kowal, PT, DPT  02/03/2022, 11:04 AM

## 2022-02-03 NOTE — Therapy (Signed)
OUTPATIENT OCCUPATIONAL THERAPY [facility]   Patient Name: Deanna Rowe MRN: [id number] DOB:1955-04-28, 66 y.o., female Today's Date: 02/03/2022  PCP: Dr. Radene Ou REFERRING PROVIDER: Ward Givens NP   OT End of Session - 02/03/22 1303     Visit Number 4    Number of Visits 25    Date for OT Re-Evaluation 03/17/22    Authorization Type [facility]    Authorization - Visit Number 4    Authorization - Number of Visits 12   request visists after 25 visist combined   OT Start Time 0933    OT Stop Time 4098    OT Time Calculation (min) 41 min              Past Medical History:  Diagnosis Date   Anemia    Anxiety    Arthritis    L HIP   Asthma    Avascular necrosis of hip ([place])    LEFT   Back pain    Chest pain    Constipation    Depression    Diabetes mellitus    Diabetes mellitus, type II (HCC)    Dry cough    Edema, lower extremity    Gait abnormality 11/18/2019   GERD (gastroesophageal reflux disease)    High cholesterol    Hypertension    Insomnia    takes Ambien nightly   Joint pain    Kidney disease    Kidney disease    Obesity    Parkinsonism 03/02/2017   PONV (postoperative nausea and vomiting)    RLS (restless legs syndrome) 01/15/2018   Sleep apnea    Swallowing difficulty    Thyroid disease    TIA (transient ischemic attack) 2012   no residual problems   Urgency incontinence    Vitamin D deficiency    Past Surgical History:  Procedure Laterality Date   DILATION AND CURETTAGE OF UTERUS     ESOPHAGEAL MANOMETRY N/A 09/03/2012   Procedure: ESOPHAGEAL MANOMETRY (EM);  Surgeon: Garlan Fair, MD;  [facility];  Service: Endoscopy;  Laterality: N/A;   ESOPHAGEAL MANOMETRY N/A 06/19/2017   Procedure: ESOPHAGEAL MANOMETRY (EM);  Surgeon: Clarene Essex, MD;  [facility];  Service: Endoscopy;  Laterality: N/A;   EXPLORATORY LAPAROTOMY     GANGLION CYST EXCISION     JOINT REPLACEMENT  2011   rt total hip   KNEE  ARTHROSCOPY     PILONIDAL CYST / SINUS EXCISION     TOTAL HIP ARTHROPLASTY Left 10/09/2013   Procedure: LEFT TOTAL HIP ARTHROPLASTY ANTERIOR APPROACH;  Surgeon: [name], MD;  [facility];  Service: Orthopedics;  Laterality: Left;   Patient Active Problem List   Diagnosis Date Noted   Allergic conjunctivitis of both eyes 07/06/2020   Gait abnormality 11/18/2019   OSA on CPAP 06/04/2019   Pain in joint of left shoulder 03/28/2019   Seasonal allergic conjunctivitis 02/07/2019   Chronic intermittent hypoxia with obstructive sleep apnea 01/25/2019   Severe obstructive sleep apnea 01/25/2019   Excessive daytime sleepiness 01/03/2019   Non-restorative sleep 01/03/2019   Nocturia more than twice per night 01/03/2019   Weight gain, abnormal 01/03/2019   Snorings 01/03/2019   Trochanteric bursitis of right hip 04/27/2018   Encounter for routine screening for malformation using ultrasonics 02/08/2018   Abnormal weight gain 02/08/2018   Acid indigestion 02/08/2018   Acute antritis 02/08/2018   Class 2 severe obesity with serious comorbidity and body mass index (BMI) of  39.0 to 39.9 in adult [facility]) 02/08/2018   Perennial allergic rhinitis 02/08/2018   Antiphospholipid syndrome ([place]) 02/08/2018   Arthralgia of hip or thigh 02/08/2018   Asthma, cough variant 02/08/2018   Vitamin D deficiency 02/08/2018   Backhand tennis elbow 02/08/2018   Cannot sleep 02/08/2018   Chronic kidney disease (CKD), stage III (moderate) ([place]) 02/08/2018   Current drug use 02/08/2018   Depression, major, single episode, in partial remission ([place]) 02/08/2018   Diabetes mellitus with polyneuropathy ([place]) 02/08/2018   Difficulty hearing 02/08/2018   Disturbance of skin sensation 02/08/2018   Hypertension associated with diabetes ([place]) 02/08/2018   Extremity pain 02/08/2018   Facet syndrome, lumbar 02/08/2018   Factor V Leiden ([place]) 02/08/2018   Fungal infection of nail 02/08/2018   Gastroenteritis and  colitis, viral 02/08/2018   Hypoglycemia 02/08/2018   Menopause 02/08/2018   Other long term (current) drug therapy 02/08/2018   Other osteonecrosis, unspecified femur ([place]) 02/08/2018   Pure hypercholesterolemia 02/08/2018   Recurrent major depressive episodes ([place]) 02/08/2018   Syncope and collapse 02/08/2018   Not well controlled moderate persistent asthma 02/08/2018   Cough, persistent 02/08/2018   Restless leg syndrome 01/15/2018   Degeneration of lumbar intervertebral disc 11/21/2017   Tremor of right hand 03/02/2017   Parkinson disease 03/02/2017   UTI (urinary tract infection) 12/01/2015   Type 2 diabetes mellitus with stage 3b chronic kidney disease, with long-term current use of insulin ([place]) 06/03/2015   Fast heart beat 04/09/2015   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 2 diabetes mellitus ([place]) 04/24/2014   Postoperative anemia due to acute blood loss 10/10/2013   Avascular necrosis of bone of left hip ([place]) 10/09/2013   Arthritis of pelvic region, degenerative 10/09/2013   Breath shortness 06/18/2013   Diabetes mellitus type 2, uncontrolled 05/31/2013   Intermittent claudication ([place]) 04/04/2013   Anxiety    Gastroesophageal reflux disease 09/12/2012   Clinical depression 02/21/2012   Stroke ([place]) 02/11/2012   History of revision of total replacement of right hip joint 04/04/2009    ONSET DATE: 12/15/21  REFERRING DIAG: Parkinsonism  THERAPY DIAG:  Unsteadiness on feet  Other symptoms and signs involving the nervous system  Muscle weakness (generalized)  Other lack of coordination  Attention and concentration deficit  Frontal lobe and executive function deficit  Rationale for Evaluation and Treatment Rehabilitation  SUBJECTIVE:   SUBJECTIVE STATEMENT: Pt reports falling several times on her trip Pt accompanied by: self  PERTINENT HISTORY: PERTINENT HISTORY: Parkinsonism (2018),TIA, thyroid disease, kidney disease, HTN, HLD, GERD, DMII, depression, L hip avascular  necrosis, anxiety, anemia, R THA 2011, L THA 2015, knee arthroscopy   PRECAUTIONS: Fall  WEIGHT BEARING RESTRICTIONS No  PAIN:  Are you having pain? No (only soreness from recent fall)   FALLS: Has patient fallen in last 6 months? Yes. Number of falls 5  LIVING ENVIRONMENT: Lives with: lives with their spouse Lives in: House/apartment  PLOF: Independent  PATIENT GOALS to be able style hair  OBJECTIVE:   HAND DOMINANCE: Right   FUNCTIONAL OUTCOME MEASURES: Fastening/unfastening 3 buttons: 50.68 Physical performance test: PPT#2 (simulated eating) 15.03 & PPT#4 (donning/doffing jacket): 13.34  COORDINATION: 9 Hole Peg test: Right: 36.91 sec; Left: 38.16 sec Box and Blocks:  Right 33blocks, Left 37blocks   TODAY'S TREATMENT:   PWR! Moves supine (basic 4)  - pt demo each x 10 w/ mod cues for large amplitude movements. Cues to look at hands   Standing: wt shifts with contralateral and ispilateral reaching  to place graded clothespins to hit targets, with trunk rotation    PATIENT EDUCATION: Education details: PWR! Basic 4 supine, strategeis to minimize fall risk- discussed Person educated: Patient Education method: Explanation, Demonstration, Verbal cues, and Handouts Education comprehension: verbalized understanding, returned demonstration, and verbal cues required    HOME EXERCISE PROGRAM: 12/27/21: PWR! Seated, coordination HEP, writing strategies 12/30/21: PWR! Hands  02/03/22- PWR! supine   ASSESSMENT:  CLINICAL IMPRESSION: Patient Pt responds well to HEP with cueing, demonstration and encouragement.  PERFORMANCE DEFICITS in functional skills including ADLs, IADLs, coordination, dexterity, proprioception, sensation, tone, ROM, strength, pain, flexibility, FMC, GMC, mobility, balance, endurance, decreased knowledge of precautions, decreased knowledge of use of DME, and UE functional use, bradykinesia , cognitive skills including attention, energy/drive, learn,  memory, problem solving, safety awareness, thought, and understand, and psychosocial skills including coping strategies, environmental adaptation, habits, interpersonal interactions, and routines and behaviors.   IMPAIRMENTS are limiting patient from ADLs, IADLs, play, leisure, and social participation.   COMORBIDITIES may have co-morbidities  that affects occupational performance. Patient will benefit from skilled OT to address above impairments and improve overall function.  MODIFICATION OR ASSISTANCE TO COMPLETE EVALUATION: No modification of tasks or assist necessary to complete an evaluation.  OT OCCUPATIONAL PROFILE AND HISTORY: Detailed assessment: Review of records and additional review of physical, cognitive, psychosocial history related to current functional performance.  CLINICAL DECISION MAKING: LOW - limited treatment options, no task modification necessary  REHAB POTENTIAL: Good  EVALUATION COMPLEXITY: Low     GOALS: Goals reviewed with patient? No  SHORT TERM GOALS: Target date: 01/20/22   I with PD specific HEP Baseline: Goal status: INITIAL  2.  Pt will verbalize understanding of adapted strategies to maximize safety and I with ADLs/ IADLs .  Baseline:  Goal status: INITIAL  3.  Pt will demonstrate improved bilateral UE functional use as evidenced by increasing box/ blocks score by 3 blocks Baseline: RUE 33, LUE 37 Goal status: INITIAL  4.  Pt will demonstrate improved ease with feeding as evidenced by decreasing PPT#2 (self feeding) by 3 secs Baseline: 15.03 Goal status: INITIAL  5.  Pt will report increased ease with styling her hair Baseline: Pt reports difficulty Goal status: INITIAL    LONG TERM GOALS: Target date: 03/17/22- Pt is currently scheduled 1 week outdiPOC  Pt will verbalize understanding of ways to prevent future PD related complications and PD community resources.  Baseline:  Goal status: INITIAL  2.  Pt will demonstrate improved  fine motor coordination for ADLs as evidenced by decreasing bilateral  9 hole peg test score for by 3 secs  Baseline: RUE 36.91, LUE 38.16 Goal status: INITIAL  3.  Pt will demonstrate understanding of memory compensations and ways to keep thinking skills sharp  Baseline:  Goal status: INITIAL  4.  Pt will demonstrate improved ease with fastening buttons as evidenced by decreasing 3 button/ unbutton time to 47 secs or less  Baseline: 50.68 Goal status: INITIAL   PLAN: OT FREQUENCY: 2x/week  OT DURATION: 12 weeks POC written for 12 weeks to account for scheduling  PLANNED INTERVENTIONS: self care/ADL training, therapeutic exercise, therapeutic activity, neuromuscular re-education, manual therapy, passive range of motion, balance training, stair training, functional mobility training, aquatic therapy, ultrasound, paraffin, fluidotherapy, moist heat, cryotherapy, contrast bath, patient/family education, cognitive remediation/compensation, visual/perceptual remediation/compensation, energy conservation, coping strategies training, DME and/or AE instructions, and Re-evaluation  RECOMMENDED OTHER SERVICES: PT  CONSULTED AND AGREED WITH PLAN OF CARE: Patient  PLAN FOR  NEXT SESSION:  work on buttons, functional reaching w/ balance, reinforce head/eye movements and posture    Ilhan Madan, OT 02/03/2022, 1:04 PM

## 2022-02-04 ENCOUNTER — Encounter: Payer: Self-pay | Admitting: Occupational Therapy

## 2022-02-04 ENCOUNTER — Ambulatory Visit: Payer: 59 | Admitting: Occupational Therapy

## 2022-02-04 ENCOUNTER — Ambulatory Visit: Payer: 59 | Admitting: Physical Therapy

## 2022-02-04 DIAGNOSIS — R2681 Unsteadiness on feet: Secondary | ICD-10-CM

## 2022-02-04 DIAGNOSIS — R41844 Frontal lobe and executive function deficit: Secondary | ICD-10-CM

## 2022-02-04 DIAGNOSIS — R29818 Other symptoms and signs involving the nervous system: Secondary | ICD-10-CM

## 2022-02-04 DIAGNOSIS — M6281 Muscle weakness (generalized): Secondary | ICD-10-CM | POA: Diagnosis not present

## 2022-02-04 DIAGNOSIS — R41840 Attention and concentration deficit: Secondary | ICD-10-CM | POA: Diagnosis not present

## 2022-02-04 DIAGNOSIS — R2689 Other abnormalities of gait and mobility: Secondary | ICD-10-CM

## 2022-02-04 DIAGNOSIS — R293 Abnormal posture: Secondary | ICD-10-CM | POA: Diagnosis not present

## 2022-02-04 DIAGNOSIS — R278 Other lack of coordination: Secondary | ICD-10-CM | POA: Diagnosis not present

## 2022-02-04 NOTE — Therapy (Signed)
OUTPATIENT OCCUPATIONAL THERAPY [facility]   Patient Name: Deanna Rowe MRN: [id number] DOB:02-Dec-1955, 66 y.o., female Today's Date: 02/04/2022  PCP: Dr. Radene Ou REFERRING PROVIDER: Ward Givens NP   OT End of Session - 02/04/22 0854     Visit Number 5    Number of Visits 25    Date for OT Re-Evaluation 03/17/22    Authorization Type [facility]    Authorization - Visit Number 5    Authorization - Number of Visits 12    OT Start Time 0848    OT Stop Time 0930    OT Time Calculation (min) 42 min              Past Medical History:  Diagnosis Date   Anemia    Anxiety    Arthritis    L HIP   Asthma    Avascular necrosis of hip ([place])    LEFT   Back pain    Chest pain    Constipation    Depression    Diabetes mellitus    Diabetes mellitus, type II (HCC)    Dry cough    Edema, lower extremity    Gait abnormality 11/18/2019   GERD (gastroesophageal reflux disease)    High cholesterol    Hypertension    Insomnia    takes Ambien nightly   Joint pain    Kidney disease    Kidney disease    Obesity    Parkinsonism 03/02/2017   PONV (postoperative nausea and vomiting)    RLS (restless legs syndrome) 01/15/2018   Sleep apnea    Swallowing difficulty    Thyroid disease    TIA (transient ischemic attack) 2012   no residual problems   Urgency incontinence    Vitamin D deficiency    Past Surgical History:  Procedure Laterality Date   DILATION AND CURETTAGE OF UTERUS     ESOPHAGEAL MANOMETRY N/A 09/03/2012   Procedure: ESOPHAGEAL MANOMETRY (EM);  Surgeon: Garlan Fair, MD;  [facility];  Service: Endoscopy;  Laterality: N/A;   ESOPHAGEAL MANOMETRY N/A 06/19/2017   Procedure: ESOPHAGEAL MANOMETRY (EM);  Surgeon: Clarene Essex, MD;  [facility];  Service: Endoscopy;  Laterality: N/A;   EXPLORATORY LAPAROTOMY     GANGLION CYST EXCISION     JOINT REPLACEMENT  2011   rt total hip   KNEE ARTHROSCOPY     PILONIDAL CYST / SINUS  EXCISION     TOTAL HIP ARTHROPLASTY Left 10/09/2013   Procedure: LEFT TOTAL HIP ARTHROPLASTY ANTERIOR APPROACH;  Surgeon: [name], MD;  [facility];  Service: Orthopedics;  Laterality: Left;   Patient Active Problem List   Diagnosis Date Noted   Allergic conjunctivitis of both eyes 07/06/2020   Gait abnormality 11/18/2019   OSA on CPAP 06/04/2019   Pain in joint of left shoulder 03/28/2019   Seasonal allergic conjunctivitis 02/07/2019   Chronic intermittent hypoxia with obstructive sleep apnea 01/25/2019   Severe obstructive sleep apnea 01/25/2019   Excessive daytime sleepiness 01/03/2019   Non-restorative sleep 01/03/2019   Nocturia more than twice per night 01/03/2019   Weight gain, abnormal 01/03/2019   Snorings 01/03/2019   Trochanteric bursitis of right hip 04/27/2018   Encounter for routine screening for malformation using ultrasonics 02/08/2018   Abnormal weight gain 02/08/2018   Acid indigestion 02/08/2018   Acute antritis 02/08/2018   Class 2 severe obesity with serious comorbidity and body mass index (BMI) of 39.0 to 39.9 in adult [facility]) 02/08/2018  Perennial allergic rhinitis 02/08/2018   Antiphospholipid syndrome (HCC) 02/08/2018   Arthralgia of hip or thigh 02/08/2018   Asthma, cough variant 02/08/2018   Vitamin D deficiency 02/08/2018   Backhand tennis elbow 02/08/2018   Cannot sleep 02/08/2018   Chronic kidney disease (CKD), stage III (moderate) ([place]) 02/08/2018   Current drug use 02/08/2018   Depression, major, single episode, in partial remission ([place]) 02/08/2018   Diabetes mellitus with polyneuropathy ([place]) 02/08/2018   Difficulty hearing 02/08/2018   Disturbance of skin sensation 02/08/2018   Hypertension associated with diabetes ([place]) 02/08/2018   Extremity pain 02/08/2018   Facet syndrome, lumbar 02/08/2018   Factor V Leiden ([place]) 02/08/2018   Fungal infection of nail 02/08/2018   Gastroenteritis and colitis, viral 02/08/2018   Hypoglycemia  02/08/2018   Menopause 02/08/2018   Other long term (current) drug therapy 02/08/2018   Other osteonecrosis, unspecified femur ([place]) 02/08/2018   Pure hypercholesterolemia 02/08/2018   Recurrent major depressive episodes ([place]) 02/08/2018   Syncope and collapse 02/08/2018   Not well controlled moderate persistent asthma 02/08/2018   Cough, persistent 02/08/2018   Restless leg syndrome 01/15/2018   Degeneration of lumbar intervertebral disc 11/21/2017   Tremor of right hand 03/02/2017   Parkinson disease 03/02/2017   UTI (urinary tract infection) 12/01/2015   Type 2 diabetes mellitus with stage 3b chronic kidney disease, with long-term current use of insulin ([place]) 06/03/2015   Fast heart beat 04/09/2015   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 2 diabetes mellitus ([place]) 04/24/2014   Postoperative anemia due to acute blood loss 10/10/2013   Avascular necrosis of bone of left hip ([place]) 10/09/2013   Arthritis of pelvic region, degenerative 10/09/2013   Breath shortness 06/18/2013   Diabetes mellitus type 2, uncontrolled 05/31/2013   Intermittent claudication ([place]) 04/04/2013   Anxiety    Gastroesophageal reflux disease 09/12/2012   Clinical depression 02/21/2012   Stroke ([place]) 02/11/2012   History of revision of total replacement of right hip joint 04/04/2009    ONSET DATE: 12/15/21  REFERRING DIAG: Parkinsonism  THERAPY DIAG:  Muscle weakness (generalized)  Other lack of coordination  Other abnormalities of gait and mobility  Other symptoms and signs involving the nervous system  Attention and concentration deficit  Frontal lobe and executive function deficit  Unsteadiness on feet  Rationale for Evaluation and Treatment Rehabilitation  SUBJECTIVE:   SUBJECTIVE STATEMENT: Pt reports falling several times on her trip Pt accompanied by: self  PERTINENT HISTORY: PERTINENT HISTORY: Parkinsonism (2018),TIA, thyroid disease, kidney disease, HTN, HLD, GERD, DMII, depression, L hip  avascular necrosis, anxiety, anemia, R THA 2011, L THA 2015, knee arthroscopy   PRECAUTIONS: Fall  WEIGHT BEARING RESTRICTIONS No  PAIN:  Are you having pain? No (only soreness from recent fall)   FALLS: Has patient fallen in last 6 months? Yes. Number of falls 5  LIVING ENVIRONMENT: Lives with: lives with their spouse Lives in: House/apartment  PLOF: Independent  PATIENT GOALS to be able style hair  OBJECTIVE:   HAND DOMINANCE: Right   FUNCTIONAL OUTCOME MEASURES: Fastening/unfastening 3 buttons: 50.68 Physical performance test: PPT#2 (simulated eating) 15.03 & PPT#4 (donning/doffing jacket): 13.34  COORDINATION: 9 Hole Peg test: Right: 36.91 sec; Left: 38.16 sec Box and Blocks:  Right 33blocks, Left 37blocks   TODAY'S TREATMENT:   PWR! Moves supine (basic 4)  - pt demo each x 10 w/ min cues for large amplitude movements. Cues to look at hands and for performance. PWR! Hands for PWR! Up, rock and twist, min v.c  followed by fastening buttons with larger amplitude movements, min v.c and demonstration Placing grooved pegs into pegboard with RUE min difficulty / v.c       PATIENT EDUCATION: Education details: PWR! Basic 4 supine reviewed Person educated: Patient Education method: explanation, demonstration Education comprehension: verbalized understanding, returned demonstration, and verbal cues required    HOME EXERCISE PROGRAM: 12/27/21: PWR! Seated, coordination HEP, writing strategies 12/30/21: PWR! Hands  02/03/22- PWR! Supine basic 4   ASSESSMENT:  CLINICAL IMPRESSION: Pt is progressing towards goals. She demonstrates understanding of PWR! Supine following review today. PERFORMANCE DEFICITS in functional skills including ADLs, IADLs, coordination, dexterity, proprioception, sensation, tone, ROM, strength, pain, flexibility, FMC, GMC, mobility, balance, endurance, decreased knowledge of precautions, decreased knowledge of use of DME, and UE functional use,  bradykinesia , cognitive skills including attention, energy/drive, learn, memory, problem solving, safety awareness, thought, and understand, and psychosocial skills including coping strategies, environmental adaptation, habits, interpersonal interactions, and routines and behaviors.   IMPAIRMENTS are limiting patient from ADLs, IADLs, play, leisure, and social participation.   COMORBIDITIES may have co-morbidities  that affects occupational performance. Patient will benefit from skilled OT to address above impairments and improve overall function.  MODIFICATION OR ASSISTANCE TO COMPLETE EVALUATION: No modification of tasks or assist necessary to complete an evaluation.  OT OCCUPATIONAL PROFILE AND HISTORY: Detailed assessment: Review of records and additional review of physical, cognitive, psychosocial history related to current functional performance.  CLINICAL DECISION MAKING: LOW - limited treatment options, no task modification necessary  REHAB POTENTIAL: Good  EVALUATION COMPLEXITY: Low     GOALS: Goals reviewed with patient? No  SHORT TERM GOALS: Target date: 02/21/22- extended due to scheduling  I with PD specific HEP Baseline: Goal status: ongoing  2.  Pt will verbalize understanding of adapted strategies to maximize safety and I with ADLs/ IADLs .  Baseline:  Goal status: ongoin  3.  Pt will demonstrate improved bilateral UE functional use as evidenced by increasing box/ blocks score by 3 blocks Baseline: RUE 33, LUE 37 Goal status: INITIAL  4.  Pt will demonstrate improved ease with feeding as evidenced by decreasing PPT#2 (self feeding) by 3 secs Baseline: 15.03 Goal status: ongoing  5.  Pt will report increased ease with styling her hair Baseline: Pt reports difficulty Goal status: ongoing    LONG TERM GOALS: Target date: 03/17/22- Pt is currently scheduled 1 week outdiPOC  Pt will verbalize understanding of ways to prevent future PD related complications  and PD community resources.  Baseline:  Goal status: INITIAL  2.  Pt will demonstrate improved fine motor coordination for ADLs as evidenced by decreasing bilateral  9 hole peg test score for by 3 secs  Baseline: RUE 36.91, LUE 38.16 Goal status: INITIAL  3.  Pt will demonstrate understanding of memory compensations and ways to keep thinking skills sharp  Baseline:  Goal status: INITIAL  4.  Pt will demonstrate improved ease with fastening buttons as evidenced by decreasing 3 button/ unbutton time to 47 secs or less  Baseline: 50.68 Goal status: INITIAL   PLAN: OT FREQUENCY: 2x/week  OT DURATION: 12 weeks POC written for 12 weeks to account for scheduling  PLANNED INTERVENTIONS: self care/ADL training, therapeutic exercise, therapeutic activity, neuromuscular re-education, manual therapy, passive range of motion, balance training, stair training, functional mobility training, aquatic therapy, ultrasound, paraffin, fluidotherapy, moist heat, cryotherapy, contrast bath, patient/family education, cognitive remediation/compensation, visual/perceptual remediation/compensation, energy conservation, coping strategies training, DME and/or AE instructions, and Re-evaluation  RECOMMENDED OTHER  SERVICES: PT  CONSULTED AND AGREED WITH PLAN OF CARE: Patient  PLAN FOR NEXT SESSION: continue ADL strategies,  functional reaching w/ balance, reinforce head/eye movements and posture    Miray Mancino, OT 02/04/2022, 8:57 AM

## 2022-02-04 NOTE — Therapy (Signed)
OUTPATIENT PHYSICAL THERAPY NEURO TREATMENT   Patient Name: ANALISIA KINGSFORD MRN: [id number] DOB:1955-05-03, 66 y.o., female Today's Date: 02/04/2022   PCP: Aretta Nip, MD REFERRING PROVIDER: Ward Givens, NP    PT End of Session - 02/04/22 0934     Visit Number 10    Number of Visits 17    Date for PT Re-Evaluation 03/15/22   due to delay in scheduling/pt going on vacation   Authorization Type [employer]    PT Start Time 5427   Handoff w/OT   PT Stop Time 1015    PT Time Calculation (min) 43 min    Equipment Utilized During Treatment Gait belt    Activity Tolerance Patient tolerated treatment well    Behavior During Therapy WFL for tasks assessed/performed                   Past Medical History:  Diagnosis Date   Anemia    Anxiety    Arthritis    L HIP   Asthma    Avascular necrosis of hip ([place])    LEFT   Back pain    Chest pain    Constipation    Depression    Diabetes mellitus    Diabetes mellitus, type II ([place])    Dry cough    Edema, lower extremity    Gait abnormality 11/18/2019   GERD (gastroesophageal reflux disease)    High cholesterol    Hypertension    Insomnia    takes Ambien nightly   Joint pain    Kidney disease    Kidney disease    Obesity    Parkinsonism 03/02/2017   PONV (postoperative nausea and vomiting)    RLS (restless legs syndrome) 01/15/2018   Sleep apnea    Swallowing difficulty    Thyroid disease    TIA (transient ischemic attack) 2012   no residual problems   Urgency incontinence    Vitamin D deficiency    Past Surgical History:  Procedure Laterality Date   DILATION AND CURETTAGE OF UTERUS     ESOPHAGEAL MANOMETRY N/A 09/03/2012   Procedure: ESOPHAGEAL MANOMETRY (EM);  Surgeon: Garlan Fair, MD;  Location: [facility];  Service: Endoscopy;  Laterality: N/A;   ESOPHAGEAL MANOMETRY N/A 06/19/2017   Procedure: ESOPHAGEAL MANOMETRY (EM);  Surgeon: Clarene Essex, MD;  Location: [facility];  Service:  Endoscopy;  Laterality: N/A;   EXPLORATORY LAPAROTOMY     GANGLION CYST EXCISION     JOINT REPLACEMENT  2011   rt total hip   KNEE ARTHROSCOPY     PILONIDAL CYST / SINUS EXCISION     TOTAL HIP ARTHROPLASTY Left 10/09/2013   Procedure: LEFT TOTAL HIP ARTHROPLASTY ANTERIOR APPROACH;  Surgeon: [name], MD;  Location: [facility];  Service: Orthopedics;  Laterality: Left;   Patient Active Problem List   Diagnosis Date Noted   Allergic conjunctivitis of both eyes 07/06/2020   Gait abnormality 11/18/2019   OSA on CPAP 06/04/2019   Pain in joint of left shoulder 03/28/2019   Seasonal allergic conjunctivitis 02/07/2019   Chronic intermittent hypoxia with obstructive sleep apnea 01/25/2019   Severe obstructive sleep apnea 01/25/2019   Excessive daytime sleepiness 01/03/2019   Non-restorative sleep 01/03/2019   Nocturia more than twice per night 01/03/2019   Weight gain, abnormal 01/03/2019   Snorings 01/03/2019   Trochanteric bursitis of right hip 04/27/2018   Encounter for routine screening for malformation using ultrasonics 02/08/2018   Abnormal weight gain  02/08/2018   Acid indigestion 02/08/2018   Acute antritis 02/08/2018   Class 2 severe obesity with serious comorbidity and body mass index (BMI) of 39.0 to 39.9 in adult [facility]) 02/08/2018   Perennial allergic rhinitis 02/08/2018   Antiphospholipid syndrome ([place]) 02/08/2018   Arthralgia of hip or thigh 02/08/2018   Asthma, cough variant 02/08/2018   Vitamin D deficiency 02/08/2018   Backhand tennis elbow 02/08/2018   Cannot sleep 02/08/2018   Chronic kidney disease (CKD), stage III (moderate) ([place]) 02/08/2018   Current drug use 02/08/2018   Depression, major, single episode, in partial remission ([place]) 02/08/2018   Diabetes mellitus with polyneuropathy ([place]) 02/08/2018   Difficulty hearing 02/08/2018   Disturbance of skin sensation 02/08/2018   Hypertension associated with diabetes ([place]) 02/08/2018   Extremity pain 02/08/2018    Facet syndrome, lumbar 02/08/2018   Factor V Leiden ([place]) 02/08/2018   Fungal infection of nail 02/08/2018   Gastroenteritis and colitis, viral 02/08/2018   Hypoglycemia 02/08/2018   Menopause 02/08/2018   Other long term (current) drug therapy 02/08/2018   Other osteonecrosis, unspecified femur ([place]) 02/08/2018   Pure hypercholesterolemia 02/08/2018   Recurrent major depressive episodes ([place]) 02/08/2018   Syncope and collapse 02/08/2018   Not well controlled moderate persistent asthma 02/08/2018   Cough, persistent 02/08/2018   Restless leg syndrome 01/15/2018   Degeneration of lumbar intervertebral disc 11/21/2017   Tremor of right hand 03/02/2017   Parkinson disease 03/02/2017   UTI (urinary tract infection) 12/01/2015   Type 2 diabetes mellitus with stage 3b chronic kidney disease, with long-term current use of insulin ([place]) 06/03/2015   Fast heart beat 04/09/2015   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 2 diabetes mellitus ([place]) 04/24/2014   Postoperative anemia due to acute blood loss 10/10/2013   Avascular necrosis of bone of left hip ([place]) 10/09/2013   Arthritis of pelvic region, degenerative 10/09/2013   Breath shortness 06/18/2013   Diabetes mellitus type 2, uncontrolled 05/31/2013   Intermittent claudication ([place]) 04/04/2013   Anxiety    Gastroesophageal reflux disease 09/12/2012   Clinical depression 02/21/2012   Stroke ([place]) 02/11/2012   History of revision of total replacement of right hip joint 04/04/2009    ONSET DATE: 12/02/2021   REFERRING DIAG: G20 (ICD-10-CM) - Parkinson disease ([place]) R29.6 (ICD-10-CM) - Falls frequently   THERAPY DIAG:  Other abnormalities of gait and mobility  Other lack of coordination  Unsteadiness on feet  Rationale for Evaluation and Treatment Rehabilitation  SUBJECTIVE:  SUBJECTIVE STATEMENT: Pt denies any changes since yesterday. Her biopsy site is bothering her this morning.   Pt accompanied by: self  PERTINENT HISTORY: Parkinsonism (2018),TIA, thyroid disease, kidney disease, HTN, HLD, GERD, DMII, depression, L hip avascular necrosis, anxiety, anemia, R THA 2011, L THA 2015, knee arthroscopy   PAIN:  Are you having pain? Yes: NPRS scale: 2/10 Pain location: Thyroid biopsy site Pain description: achy   PRECAUTIONS: Fall, No driving.   FALLS: Has patient fallen in last 6 months? Yes. Number of falls 10  VITALS There were no vitals filed for this visit.   PLOF: Independent with community mobility with device  PATIENT GOALS Wants to stop falling, improve balance.    OBJECTIVE:   COGNITION: Overall cognitive status: Impaired  TODAY'S TREATMENT:  Ther Ex SciFit multi-peaks level 7 for 8 minutes using BUE/BLEs for neural priming for reciprocal movement, dynamic cardiovascular warmup and increased amplitude of stepping. Pt averaged >80 steps/min. RPE of 4/10 following activity.    NMR  Side step w/contralateral OH reach to post-it target, x10 per side for improved UE/LE coordination,lateral weight shifting and set-switching. Pt unable to perform unless calling out side to step with and reach with. Pt able to self-identify incorrect sequence. Added cog dual task (naming veggies when reaching to L and fruits to R) and pt able to maintain sequence but significantly slowed down task. CGA throughout for safety. Added blue foam beam and had pt continue to step and reach for added balance challenge and anterior weight shift. Progressed to calling out random multiplication questions as pt reaching and had her answer while reaching, x10 reps. Pt able to answer multiplication questions more quickly than naming fruits/veggies. RPE of 8/10 following activity.  In // bars, standing on rockerboard in A/P direction without UE support and using  mirror for viusal biofeedback on body position for improved midline orientation and knowledge of posterior lean. Started with pt static standing on board and progressed to A/P weight shifts. Pt able to shift anteriorly well but unable to maintain balance in posterior direction, requiring BUE support and min A to stabilize. Pt also unable to lean back far prior to losing balance.    GAIT: Gait pattern: decreased arm swing- Left, decreased stride length, Right foot flat, Left foot flat, decreased trunk rotation, trunk flexed, and narrow BOS Distance walked: Clinic distances. Assistive device utilized: Single point cane and None Level of assistance: SBA Comments: Min cues for wider BOS and facilitation of arm swing and incr step length bilat.    PATIENT EDUCATION: Education details: Continue with HEP.  Person educated: Patient Education method: Explanation Education comprehension: verbalized understanding and needs further education   HOME EXERCISE PROGRAM: Access Code: [email or web address] URL: [email or web address] Date: 12/22/2021 Prepared by: Mickie Bail Dailey Buccheri  Exercises - Sit to Stand Without Arm Support  - 1 x daily - 7 x weekly - 2 sets - 5-6 reps - Heel Raises with Counter Support  - 1 x daily - 7 x weekly - 3 sets - 10 reps - Step sideways with arms reaching at counter   - 1 x daily - 7 x weekly - 3 sets - 10 reps - Alternating Step Backward with Support  - 1 x daily - 7 x weekly - 3 sets - 10 reps - Standing Quarter Turn with Counter Support  - 1 x daily - 7 x weekly - 3 sets - 10 reps - Seated Hamstring Stretch  - 1 x daily - 7 x weekly - 1  sets - 3-4 reps - 30 second hold - Calf stretch  - 1 x daily - 7 x weekly - 1 sets - 3-4 reps - 30-45 second hold - Alternating Step Backward with Support  - 1 x daily - 7 x weekly - 3 sets - 10 reps  Standing PWR moves    GOALS: Goals reviewed with patient? Yes  SHORT TERM GOALS: Target date: 01/12/2022  Pt will be independent  with initial HEP in order to build upon functional gains made in therapy.  Baseline: Goal status: MET  2.  Pt will improve MiniBest to 19/28 for decreased fall risk and improvement with compensatory stepping strategies.   Baseline: 16/28; 19/28  Goal status: MET  3.  Pt will improve 5x sit<>stand to less than or equal to 24 sec with no UE support to demonstrate improved functional strength and transfer efficiency.  Baseline: 28.59 seconds with no UE support; 13.21s without UE support Goal status: MET  4.  Pt will improve gait speed with LRAD to at least 2.3 ft/sec in order to demo improved community mobility.  Baseline: 16.58 seconds = 1.95 ft/sec with SPC; 2.26f/s w/SPC Goal status: MET  5.  Pt will improve manual TUG to 14 seconds or less in order to decr fall risk. Baseline: 16 seconds with SPC; 21.93s w/SPC Goal status: NOT MET   LONG TERM GOALS: Target date: 02/09/2022  Pt will be independent with final HEP for PD specific deficits/balance in order to build upon functional gains made in therapy. Baseline:  Goal status: INITIAL  2.  Pt will improve MiniBest to 23/28 for decreased fall risk and improvement with compensatory stepping strategies.   Baseline: 16/28 Goal status: REVISED  3.  Pt will improve gait speed with LRAD to at least 2.8 ft/sec in order to demo improved community mobility.  Baseline: 16.58 seconds = 1.95 ft/sec with SPC; 2.5 ft/s w/ SPC on 10/10  Goal status: REVISED   4.  Pt will improve 5x sit<>stand to less than or equal to 18 sec with no UE support to demonstrate improved functional strength and transfer efficiency.  Baseline: 28.59 seconds with no UE support; 13.21s on 10/10  Goal status: MET  5.  Pt will verbalize understanding of local Parkinson's disease resources, including options for continued community fitness. Baseline:  Goal status: INITIAL    ASSESSMENT:  CLINICAL IMPRESSION: Emphasis of skilled PT session on lateral weight  shifting, dual-tasking and midline orientation. Pt tolerated able to self-correct improper UE/LE sequence on her own w/activity but had more difficulty naming objects than answering math equations w/motor tasks. Pt demonstrates poor awareness of posterior lean and impaired righting reactions. Continue POC.     OBJECTIVE IMPAIRMENTS Abnormal gait, decreased activity tolerance, decreased balance, decreased coordination, difficulty walking, decreased strength, impaired flexibility, impaired tone, and postural dysfunction.   ACTIVITY LIMITATIONS bending, squatting, stairs, transfers, and locomotion level  PARTICIPATION LIMITATIONS: driving and community activity  PERSONAL FACTORS Behavior pattern, Past/current experiences, Time since onset of injury/illness/exacerbation, and 3+ comorbidities: Parkinsonism (2018),TIA, thyroid disease, kidney disease, HTN, HLD, GERD, DMII, depression, L hip avascular necrosis, anxiety, anemia, R THA 2011, L THA 2015, knee arthroscopy   are also affecting patient's functional outcome.   REHAB POTENTIAL: Good  CLINICAL DECISION MAKING: Evolving/moderate complexity  EVALUATION COMPLEXITY: Moderate  PLAN: PT FREQUENCY: 2x/week  PT DURATION: 12 weeks  PLANNED INTERVENTIONS: Therapeutic exercises, Therapeutic activity, Neuromuscular re-education, Balance training, Gait training, Patient/Family education, Self Care, Stair training, Vestibular training, DME instructions, Manual  therapy, and Re-evaluation  PLAN FOR NEXT SESSION: Work on stepping strategies (posterior), functional strength. Sit <>stands on wedge, scifit for endurance,forward lunge over beam, reaching out of BOS, resisted walking, sit <>stands w/slam ball, treadmill? Review standing PWR moves as needed. Dual tasking   Cruzita Lederer Duard Spiewak, PT, DPT  02/04/2022, 10:21 AM

## 2022-02-07 LAB — CYTOLOGY - NON PAP

## 2022-02-08 ENCOUNTER — Ambulatory Visit: Payer: 59 | Admitting: Occupational Therapy

## 2022-02-08 ENCOUNTER — Ambulatory Visit: Payer: 59 | Admitting: Physical Therapy

## 2022-02-08 DIAGNOSIS — M6281 Muscle weakness (generalized): Secondary | ICD-10-CM

## 2022-02-08 DIAGNOSIS — R29818 Other symptoms and signs involving the nervous system: Secondary | ICD-10-CM | POA: Diagnosis not present

## 2022-02-08 DIAGNOSIS — R2689 Other abnormalities of gait and mobility: Secondary | ICD-10-CM

## 2022-02-08 DIAGNOSIS — R41840 Attention and concentration deficit: Secondary | ICD-10-CM

## 2022-02-08 DIAGNOSIS — R2681 Unsteadiness on feet: Secondary | ICD-10-CM

## 2022-02-08 DIAGNOSIS — R278 Other lack of coordination: Secondary | ICD-10-CM

## 2022-02-08 DIAGNOSIS — R293 Abnormal posture: Secondary | ICD-10-CM

## 2022-02-08 DIAGNOSIS — R41844 Frontal lobe and executive function deficit: Secondary | ICD-10-CM

## 2022-02-08 NOTE — Therapy (Signed)
OUTPATIENT OCCUPATIONAL THERAPY [facility]   Patient Name: Deanna Rowe MRN: [id number] DOB:22-Mar-1956, 66 y.o., female Today's Date: 02/08/2022  PCP: Dr. Radene Ou REFERRING PROVIDER: Ward Givens NP   OT End of Session - 02/08/22 0935     Visit Number 5    Number of Visits 25    Date for OT Re-Evaluation 03/17/22    Authorization Type [facility]    Authorization - Visit Number 6    Authorization - Number of Visits 12    OT Start Time 0933    OT Stop Time 8099    OT Time Calculation (min) 42 min    Activity Tolerance Patient tolerated treatment well    Behavior During Therapy [facility] for tasks assessed/performed              Past Medical History:  Diagnosis Date   Anemia    Anxiety    Arthritis    L HIP   Asthma    Avascular necrosis of hip ([place])    LEFT   Back pain    Chest pain    Constipation    Depression    Diabetes mellitus    Diabetes mellitus, type II ([place])    Dry cough    Edema, lower extremity    Gait abnormality 11/18/2019   GERD (gastroesophageal reflux disease)    High cholesterol    Hypertension    Insomnia    takes Ambien nightly   Joint pain    Kidney disease    Kidney disease    Obesity    Parkinsonism 03/02/2017   PONV (postoperative nausea and vomiting)    RLS (restless legs syndrome) 01/15/2018   Sleep apnea    Swallowing difficulty    Thyroid disease    TIA (transient ischemic attack) 2012   no residual problems   Urgency incontinence    Vitamin D deficiency    Past Surgical History:  Procedure Laterality Date   DILATION AND CURETTAGE OF UTERUS     ESOPHAGEAL MANOMETRY N/A 09/03/2012   Procedure: ESOPHAGEAL MANOMETRY (EM);  Surgeon: Garlan Fair, MD;  Location: [facility];  Service: Endoscopy;  Laterality: N/A;   ESOPHAGEAL MANOMETRY N/A 06/19/2017   Procedure: ESOPHAGEAL MANOMETRY (EM);  Surgeon: Clarene Essex, MD;  Location: [facility];  Service: Endoscopy;  Laterality: N/A;   EXPLORATORY LAPAROTOMY      GANGLION CYST EXCISION     JOINT REPLACEMENT  2011   rt total hip   KNEE ARTHROSCOPY     PILONIDAL CYST / SINUS EXCISION     TOTAL HIP ARTHROPLASTY Left 10/09/2013   Procedure: LEFT TOTAL HIP ARTHROPLASTY ANTERIOR APPROACH;  Surgeon: [name], MD;  Location: [facility];  Service: Orthopedics;  Laterality: Left;   Patient Active Problem List   Diagnosis Date Noted   Allergic conjunctivitis of both eyes 07/06/2020   Gait abnormality 11/18/2019   OSA on CPAP 06/04/2019   Pain in joint of left shoulder 03/28/2019   Seasonal allergic conjunctivitis 02/07/2019   Chronic intermittent hypoxia with obstructive sleep apnea 01/25/2019   Severe obstructive sleep apnea 01/25/2019   Excessive daytime sleepiness 01/03/2019   Non-restorative sleep 01/03/2019   Nocturia more than twice per night 01/03/2019   Weight gain, abnormal 01/03/2019   Snorings 01/03/2019   Trochanteric bursitis of right hip 04/27/2018   Encounter for routine screening for malformation using ultrasonics 02/08/2018   Abnormal weight gain 02/08/2018   Acid indigestion 02/08/2018   Acute antritis 02/08/2018   Class  2 severe obesity with serious comorbidity and body mass index (BMI) of 39.0 to 39.9 in adult [facility]) 02/08/2018   Perennial allergic rhinitis 02/08/2018   Antiphospholipid syndrome ([place]) 02/08/2018   Arthralgia of hip or thigh 02/08/2018   Asthma, cough variant 02/08/2018   Vitamin D deficiency 02/08/2018   Backhand tennis elbow 02/08/2018   Cannot sleep 02/08/2018   Chronic kidney disease (CKD), stage III (moderate) ([place]) 02/08/2018   Current drug use 02/08/2018   Depression, major, single episode, in partial remission ([place]) 02/08/2018   Diabetes mellitus with polyneuropathy ([place]) 02/08/2018   Difficulty hearing 02/08/2018   Disturbance of skin sensation 02/08/2018   Hypertension associated with diabetes ([place]) 02/08/2018   Extremity pain 02/08/2018   Facet syndrome, lumbar 02/08/2018   Factor V Leiden ([place])  02/08/2018   Fungal infection of nail 02/08/2018   Gastroenteritis and colitis, viral 02/08/2018   Hypoglycemia 02/08/2018   Menopause 02/08/2018   Other long term (current) drug therapy 02/08/2018   Other osteonecrosis, unspecified femur ([place]) 02/08/2018   Pure hypercholesterolemia 02/08/2018   Recurrent major depressive episodes ([place]) 02/08/2018   Syncope and collapse 02/08/2018   Not well controlled moderate persistent asthma 02/08/2018   Cough, persistent 02/08/2018   Restless leg syndrome 01/15/2018   Degeneration of lumbar intervertebral disc 11/21/2017   Tremor of right hand 03/02/2017   Parkinson disease 03/02/2017   UTI (urinary tract infection) 12/01/2015   Type 2 diabetes mellitus with stage 3b chronic kidney disease, with long-term current use of insulin ([place]) 06/03/2015   Fast heart beat 04/09/2015   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 2 diabetes mellitus ([place]) 04/24/2014   Postoperative anemia due to acute blood loss 10/10/2013   Avascular necrosis of bone of left hip ([place]) 10/09/2013   Arthritis of pelvic region, degenerative 10/09/2013   Breath shortness 06/18/2013   Diabetes mellitus type 2, uncontrolled 05/31/2013   Intermittent claudication ([place]) 04/04/2013   Anxiety    Gastroesophageal reflux disease 09/12/2012   Clinical depression 02/21/2012   Stroke ([place]) 02/11/2012   History of revision of total replacement of right hip joint 04/04/2009    ONSET DATE: 12/15/21  REFERRING DIAG: Parkinsonism  THERAPY DIAG:  Other abnormalities of gait and mobility  Other lack of coordination  Unsteadiness on feet  Muscle weakness (generalized)  Other symptoms and signs involving the nervous system  Attention and concentration deficit  Frontal lobe and executive function deficit  Rationale for Evaluation and Treatment Rehabilitation  SUBJECTIVE:   SUBJECTIVE STATEMENT: Pt reports falling several times on her trip Pt accompanied by: self  PERTINENT HISTORY: PERTINENT  HISTORY: Parkinsonism (2018),TIA, thyroid disease, kidney disease, HTN, HLD, GERD, DMII, depression, L hip avascular necrosis, anxiety, anemia, R THA 2011, L THA 2015, knee arthroscopy   PRECAUTIONS: Fall  WEIGHT BEARING RESTRICTIONS No  PAIN:  Are you having pain? No (only soreness from recent fall)   FALLS: Has patient fallen in last 6 months? Yes. Number of falls 5  LIVING ENVIRONMENT: Lives with: lives with their spouse Lives in: House/apartment  PLOF: Independent  PATIENT GOALS to be able style hair  OBJECTIVE:   HAND DOMINANCE: Right   FUNCTIONAL OUTCOME MEASURES: Fastening/unfastening 3 buttons: 50.68 Physical performance test: PPT#2 (simulated eating) 15.03 & PPT#4 (donning/doffing jacket): 13.34  COORDINATION: 9 Hole Peg test: Right: 36.91 sec; Left: 38.16 sec Box and Blocks:  Right 33blocks, Left 37blocks   TODAY'S TREATMENT:   PWR! Moves seated basic 4 - pt demo each x 10 w/ min cues for large  amplitude movements. Cues to look at hands and for performance. Standing at tabletop with cues to spread feet and stepping backwards for stepping strategy to reduce falls, minguard and min v.c for amplitude Functional reaching with feet staggered to place and remove graded clothespins with left and right UE's to and from low surface ,minguard and min v.c for positioning. Pt reports feeling depressed. Pt was noted to have increased difficulty following cues and slower processing today. Pt was encouraged to call her psychologist/ psychiatrist and to consider having her husband come in next visit.  Arm bike x 5 mins for conditioning       PATIENT EDUCATION: Education details: PWR! Basic 4 supine reviewed Person educated: Patient Education method: explanation, demonstration Education comprehension: verbalized understanding, returned demonstration, and verbal cues required    HOME EXERCISE PROGRAM: 12/27/21: PWR! Seated, coordination HEP, writing strategies 12/30/21:  PWR! Hands  02/03/22- PWR! Supine basic 4   ASSESSMENT:  CLINICAL IMPRESSION: Pt is progressing towards goals. She demonstrates delayed processing today and reports feeling depressed. Pt may benefit from seeing her counselor. PERFORMANCE DEFICITS in functional skills including ADLs, IADLs, coordination, dexterity, proprioception, sensation, tone, ROM, strength, pain, flexibility, FMC, GMC, mobility, balance, endurance, decreased knowledge of precautions, decreased knowledge of use of DME, and UE functional use, bradykinesia , cognitive skills including attention, energy/drive, learn, memory, problem solving, safety awareness, thought, and understand, and psychosocial skills including coping strategies, environmental adaptation, habits, interpersonal interactions, and routines and behaviors.   IMPAIRMENTS are limiting patient from ADLs, IADLs, play, leisure, and social participation.   COMORBIDITIES may have co-morbidities  that affects occupational performance. Patient will benefit from skilled OT to address above impairments and improve overall function.  MODIFICATION OR ASSISTANCE TO COMPLETE EVALUATION: No modification of tasks or assist necessary to complete an evaluation.  OT OCCUPATIONAL PROFILE AND HISTORY: Detailed assessment: Review of records and additional review of physical, cognitive, psychosocial history related to current functional performance.  CLINICAL DECISION MAKING: LOW - limited treatment options, no task modification necessary  REHAB POTENTIAL: Good  EVALUATION COMPLEXITY: Low     GOALS: Goals reviewed with patient? No  SHORT TERM GOALS: Target date: 02/21/22- extended due to scheduling  I with PD specific HEP Baseline: Goal status: ongoing  2.  Pt will verbalize understanding of adapted strategies to maximize safety and I with ADLs/ IADLs .  Baseline:  Goal status: ongoin  3.  Pt will demonstrate improved bilateral UE functional use as evidenced by  increasing box/ blocks score by 3 blocks Baseline: RUE 33, LUE 37 Goal status: INITIAL  4.  Pt will demonstrate improved ease with feeding as evidenced by decreasing PPT#2 (self feeding) by 3 secs Baseline: 15.03 Goal status: ongoing  5.  Pt will report increased ease with styling her hair Baseline: Pt reports difficulty Goal status: ongoing    LONG TERM GOALS: Target date: 03/17/22-  1.Pt will verbalize understanding of ways to prevent future PD related complications and PD community resources.  Baseline:  Goal status: INITIAL  2.  Pt will demonstrate improved fine motor coordination for ADLs as evidenced by decreasing bilateral  9 hole peg test score for by 3 secs  Baseline: RUE 36.91, LUE 38.16 Goal status: INITIAL  3.  Pt will demonstrate understanding of memory compensations and ways to keep thinking skills sharp  Baseline:  Goal status: INITIAL  4.  Pt will demonstrate improved ease with fastening buttons as evidenced by decreasing 3 button/ unbutton time to 47 secs or less  Baseline: 50.68 Goal status: INITIAL   PLAN: OT FREQUENCY: 2x/week  OT DURATION: 12 weeks POC written for 12 weeks to account for scheduling  PLANNED INTERVENTIONS: self care/ADL training, therapeutic exercise, therapeutic activity, neuromuscular re-education, manual therapy, passive range of motion, balance training, stair training, functional mobility training, aquatic therapy, ultrasound, paraffin, fluidotherapy, moist heat, cryotherapy, contrast bath, patient/family education, cognitive remediation/compensation, visual/perceptual remediation/compensation, energy conservation, coping strategies training, DME and/or AE instructions, and Re-evaluation  RECOMMENDED OTHER SERVICES: PT  CONSULTED AND AGREED WITH PLAN OF CARE: Patient  PLAN FOR NEXT SESSION: continue ADL strategies,  stepping strategies, functional reaching w/ balance, reinforce head/eye movements and posture    Londynn Sonoda,  OT 02/08/2022, 9:44 AM

## 2022-02-08 NOTE — Therapy (Signed)
OUTPATIENT PHYSICAL THERAPY NEURO TREATMENT   Patient Name: KEENYA MATERA MRN: [id number] DOB:1955-11-02, 66 y.o., female Today's Date: 02/08/2022   PCP: Aretta Nip, MD REFERRING PROVIDER: Ward Givens, NP    PT End of Session - 02/08/22 1023     Visit Number 11    Number of Visits 17    Date for PT Re-Evaluation 03/15/22   due to delay in scheduling/pt going on vacation   Authorization Type [employer]    PT Start Time 1019   Handoff w/OT   PT Stop Time 1100    PT Time Calculation (min) 41 min    Equipment Utilized During Treatment Gait belt    Activity Tolerance Patient tolerated treatment well    Behavior During Therapy WFL for tasks assessed/performed                    Past Medical History:  Diagnosis Date   Anemia    Anxiety    Arthritis    L HIP   Asthma    Avascular necrosis of hip ([place])    LEFT   Back pain    Chest pain    Constipation    Depression    Diabetes mellitus    Diabetes mellitus, type II ([place])    Dry cough    Edema, lower extremity    Gait abnormality 11/18/2019   GERD (gastroesophageal reflux disease)    High cholesterol    Hypertension    Insomnia    takes Ambien nightly   Joint pain    Kidney disease    Kidney disease    Obesity    Parkinsonism 03/02/2017   PONV (postoperative nausea and vomiting)    RLS (restless legs syndrome) 01/15/2018   Sleep apnea    Swallowing difficulty    Thyroid disease    TIA (transient ischemic attack) 2012   no residual problems   Urgency incontinence    Vitamin D deficiency    Past Surgical History:  Procedure Laterality Date   DILATION AND CURETTAGE OF UTERUS     ESOPHAGEAL MANOMETRY N/A 09/03/2012   Procedure: ESOPHAGEAL MANOMETRY (EM);  Surgeon: Garlan Fair, MD;  Location: [facility];  Service: Endoscopy;  Laterality: N/A;   ESOPHAGEAL MANOMETRY N/A 06/19/2017   Procedure: ESOPHAGEAL MANOMETRY (EM);  Surgeon: Clarene Essex, MD;  Location: [facility];   Service: Endoscopy;  Laterality: N/A;   EXPLORATORY LAPAROTOMY     GANGLION CYST EXCISION     JOINT REPLACEMENT  2011   rt total hip   KNEE ARTHROSCOPY     PILONIDAL CYST / SINUS EXCISION     TOTAL HIP ARTHROPLASTY Left 10/09/2013   Procedure: LEFT TOTAL HIP ARTHROPLASTY ANTERIOR APPROACH;  Surgeon: [name], MD;  Location: [facility];  Service: Orthopedics;  Laterality: Left;   Patient Active Problem List   Diagnosis Date Noted   Allergic conjunctivitis of both eyes 07/06/2020   Gait abnormality 11/18/2019   OSA on CPAP 06/04/2019   Pain in joint of left shoulder 03/28/2019   Seasonal allergic conjunctivitis 02/07/2019   Chronic intermittent hypoxia with obstructive sleep apnea 01/25/2019   Severe obstructive sleep apnea 01/25/2019   Excessive daytime sleepiness 01/03/2019   Non-restorative sleep 01/03/2019   Nocturia more than twice per night 01/03/2019   Weight gain, abnormal 01/03/2019   Snorings 01/03/2019   Trochanteric bursitis of right hip 04/27/2018   Encounter for routine screening for malformation using ultrasonics 02/08/2018   Abnormal weight  gain 02/08/2018   Acid indigestion 02/08/2018   Acute antritis 02/08/2018   Class 2 severe obesity with serious comorbidity and body mass index (BMI) of 39.0 to 39.9 in adult ([place]) 02/08/2018   Perennial allergic rhinitis 02/08/2018   Antiphospholipid syndrome ([place]) 02/08/2018   Arthralgia of hip or thigh 02/08/2018   Asthma, cough variant 02/08/2018   Vitamin D deficiency 02/08/2018   Backhand tennis elbow 02/08/2018   Cannot sleep 02/08/2018   Chronic kidney disease (CKD), stage III (moderate) ([place]) 02/08/2018   Current drug use 02/08/2018   Depression, major, single episode, in partial remission ([place]) 02/08/2018   Diabetes mellitus with polyneuropathy ([place]) 02/08/2018   Difficulty hearing 02/08/2018   Disturbance of skin sensation 02/08/2018   Hypertension associated with diabetes ([place]) 02/08/2018   Extremity pain  02/08/2018   Facet syndrome, lumbar 02/08/2018   Factor V Leiden ([place]) 02/08/2018   Fungal infection of nail 02/08/2018   Gastroenteritis and colitis, viral 02/08/2018   Hypoglycemia 02/08/2018   Menopause 02/08/2018   Other long term (current) drug therapy 02/08/2018   Other osteonecrosis, unspecified femur ([place]) 02/08/2018   Pure hypercholesterolemia 02/08/2018   Recurrent major depressive episodes ([place]) 02/08/2018   Syncope and collapse 02/08/2018   Not well controlled moderate persistent asthma 02/08/2018   Cough, persistent 02/08/2018   Restless leg syndrome 01/15/2018   Degeneration of lumbar intervertebral disc 11/21/2017   Tremor of right hand 03/02/2017   Parkinson disease 03/02/2017   UTI (urinary tract infection) 12/01/2015   Type 2 diabetes mellitus with stage 3b chronic kidney disease, with long-term current use of insulin ([place]) 06/03/2015   Fast heart beat 04/09/2015   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 2 diabetes mellitus ([place]) 04/24/2014   Postoperative anemia due to acute blood loss 10/10/2013   Avascular necrosis of bone of left hip ([place]) 10/09/2013   Arthritis of pelvic region, degenerative 10/09/2013   Breath shortness 06/18/2013   Diabetes mellitus type 2, uncontrolled 05/31/2013   Intermittent claudication ([place]) 04/04/2013   Anxiety    Gastroesophageal reflux disease 09/12/2012   Clinical depression 02/21/2012   Stroke ([place]) 02/11/2012   History of revision of total replacement of right hip joint 04/04/2009    ONSET DATE: 12/02/2021   REFERRING DIAG: G20 (ICD-10-CM) - Parkinson disease ([place]) R29.6 (ICD-10-CM) - Falls frequently   THERAPY DIAG:  Other abnormalities of gait and mobility  Unsteadiness on feet  Abnormal posture  Rationale for Evaluation and Treatment Rehabilitation  SUBJECTIVE:  SUBJECTIVE STATEMENT: Pt reports she had two near misses this past weekend, once when walking into grocery store (husband caught her) and once while doing laundry (pt caught herself). All near misses are in posterior direction. Pt ambulated into session using rollator today instead of cane.   Pt accompanied by: self  PERTINENT HISTORY: Parkinsonism (2018),TIA, thyroid disease, kidney disease, HTN, HLD, GERD, DMII, depression, L hip avascular necrosis, anxiety, anemia, R THA 2011, L THA 2015, knee arthroscopy   PAIN:  Are you having pain? No   PRECAUTIONS: Fall, No driving.   FALLS: Has patient fallen in last 6 months? Yes. Number of falls 10  VITALS There were no vitals filed for this visit.   PLOF: Independent with community mobility with device  PATIENT GOALS Wants to stop falling, improve balance.    OBJECTIVE:   COGNITION: Overall cognitive status: Impaired  TODAY'S TREATMENT:  Ther Act Reviewed importance of proper foot placement and staggered stance when performing reaching activities. Pt verbalized understanding but could benefit from family education (husband) to ensure strategies are implemented at home   Ther Ex SciFit multi-peaks level 7 for 8 minutes using BUE/BLEs for neural priming for reciprocal movement, dynamic cardiovascular warmup and increased amplitude of stepping. Pt averaged >80 steps/min. RPE of 7/10 following activity.    NMR  In // bars for improved postural control, LE coordination and weight shifting:  On rockerboard in A/P direction in front of mirror for visual biofeedback, anterior/posterior weight shifts without UE support. Pt able to shift weight anteriorly well but loses balance w/each posterior shift (very minimal shift compared to anterior), requiring min A and BUE support to stabilize. Therapist caught pt prior to her initiating righting reaction w/posterior LOB. Noted very narrow foot placement and genu valgus bilaterally  to stabilize despite cues for wider BOS.  Progressed to PWR Up (provided by [facility] certified therapist) on rockerboard, x15 reps. Noted anterior weight shift preference and no LOB posteriorly.  Using blue airex, alt step fwd w/contralateral march without BUE support, x10 reps per side. Pt initially requiring max concurrent cues to perform correct sequence and noted continued narrow BOS w/genu valgus. After 6 or so reps, pt able to perform without cueing, but did not maintain alt sequence. Pt w/increased difficulty performing on R side > L side and frequently leaned into rail to maintain balance.     GAIT: Gait pattern:  genu valgus of RLE, step through pattern, decreased stride length, Right foot flat, Left foot flat, decreased trunk rotation, trunk flexed, and narrow BOS Distance walked: Clinic distances. Assistive device utilized: Walker - 4 wheeled Level of assistance: SBA Comments: Min cues for wider BOS. Pt requires extra time to initiate proper turns and transfers, but is able to do so without cues. Noted increased cadence and step length/clearance with use of rollator vs cane.    PATIENT EDUCATION: Education details: Continue with HEP, reviewed importance of proper foot placement and wide BOS when performing reaching activities.  Person educated: Patient Education method: Explanation Education comprehension: verbalized understanding and needs further education   HOME EXERCISE PROGRAM: Access Code: [email or web address] URL: [email or web address] Date: 12/22/2021 Prepared by: Mickie Bail Darlin Stenseth  Exercises - Sit to Stand Without Arm Support  - 1 x daily - 7 x weekly - 2 sets - 5-6 reps - Heel Raises with Counter Support  - 1 x daily - 7 x weekly - 3 sets - 10 reps - Step sideways with arms reaching at counter   - 1 x daily -  7 x weekly - 3 sets - 10 reps - Alternating Step Backward with Support  - 1 x daily - 7 x weekly - 3 sets - 10 reps - Standing Quarter Turn with Counter Support  -  1 x daily - 7 x weekly - 3 sets - 10 reps - Seated Hamstring Stretch  - 1 x daily - 7 x weekly - 1 sets - 3-4 reps - 30 second hold - Calf stretch  - 1 x daily - 7 x weekly - 1 sets - 3-4 reps - 30-45 second hold - Alternating Step Backward with Support  - 1 x daily - 7 x weekly - 3 sets - 10 reps  Standing PWR moves    GOALS: Goals reviewed with patient? Yes  SHORT TERM GOALS: Target date: 01/12/2022  Pt will be independent with initial HEP in order to build upon functional gains made in therapy.  Baseline: Goal status: MET  2.  Pt will improve MiniBest to 19/28 for decreased fall risk and improvement with compensatory stepping strategies.   Baseline: 16/28; 19/28  Goal status: MET  3.  Pt will improve 5x sit<>stand to less than or equal to 24 sec with no UE support to demonstrate improved functional strength and transfer efficiency.  Baseline: 28.59 seconds with no UE support; 13.21s without UE support Goal status: MET  4.  Pt will improve gait speed with LRAD to at least 2.3 ft/sec in order to demo improved community mobility.  Baseline: 16.58 seconds = 1.95 ft/sec with SPC; 2.62f/s w/SPC Goal status: MET  5.  Pt will improve manual TUG to 14 seconds or less in order to decr fall risk. Baseline: 16 seconds with SPC; 21.93s w/SPC Goal status: NOT MET   LONG TERM GOALS: Target date: 02/09/2022  Pt will be independent with final HEP for PD specific deficits/balance in order to build upon functional gains made in therapy. Baseline:  Goal status: INITIAL  2.  Pt will improve MiniBest to 23/28 for decreased fall risk and improvement with compensatory stepping strategies.   Baseline: 16/28 Goal status: REVISED  3.  Pt will improve gait speed with LRAD to at least 2.8 ft/sec in order to demo improved community mobility.  Baseline: 16.58 seconds = 1.95 ft/sec with SPC; 2.5 ft/s w/ SPC on 10/10  Goal status: REVISED   4.  Pt will improve 5x sit<>stand to less than or equal  to 18 sec with no UE support to demonstrate improved functional strength and transfer efficiency.  Baseline: 28.59 seconds with no UE support; 13.21s on 10/10  Goal status: MET  5.  Pt will verbalize understanding of local Parkinson's disease resources, including options for continued community fitness. Baseline:  Goal status: INITIAL    ASSESSMENT:  CLINICAL IMPRESSION: Emphasis of skilled PT session on continued education on proper foot placement w/activity, postural control and reactive balance strategies. Pt now using rollator due to recurrent near-misses in posterior direction. Pt demonstrates very delayed righting reactions when losing balance posteriorly and has poor awareness of body position even with visual, verbal and tactile feedback. Pt demonstrates very narrow BOS w/genu valgus bilaterally (R>L) to stabilize w/balance tasks, likely contributing to her posterior LOB w/reaching tasks. Continue POC.     OBJECTIVE IMPAIRMENTS Abnormal gait, decreased activity tolerance, decreased balance, decreased coordination, difficulty walking, decreased strength, impaired flexibility, impaired tone, and postural dysfunction.   ACTIVITY LIMITATIONS bending, squatting, stairs, transfers, and locomotion level  PARTICIPATION LIMITATIONS: driving and community activity  PERSONAL FACTORS  Behavior pattern, Past/current experiences, Time since onset of injury/illness/exacerbation, and 3+ comorbidities: Parkinsonism (2018),TIA, thyroid disease, kidney disease, HTN, HLD, GERD, DMII, depression, L hip avascular necrosis, anxiety, anemia, R THA 2011, L THA 2015, knee arthroscopy   are also affecting patient's functional outcome.   REHAB POTENTIAL: Good  CLINICAL DECISION MAKING: Evolving/moderate complexity  EVALUATION COMPLEXITY: Moderate  PLAN: PT FREQUENCY: 2x/week  PT DURATION: 12 weeks  PLANNED INTERVENTIONS: Therapeutic exercises, Therapeutic activity, Neuromuscular re-education, Balance  training, Gait training, Patient/Family education, Self Care, Stair training, Vestibular training, DME instructions, Manual therapy, and Re-evaluation  PLAN FOR NEXT SESSION: Check goals and add new ones. Work on stepping strategies (posterior), wider BOS and staggered stance w/reaching tasks. functional strength. Sit <>stands on wedge, scifit for endurance,forward lunge over beam, reaching out of BOS, resisted walking, sit <>stands w/slam ball. Review standing PWR moves as needed. Dual tasking   Cruzita Lederer Erikah Thumm, PT, DPT  02/08/2022, 11:04 AM

## 2022-02-10 ENCOUNTER — Ambulatory Visit: Payer: 59 | Admitting: Physical Therapy

## 2022-02-10 ENCOUNTER — Encounter: Payer: Self-pay | Admitting: Physical Therapy

## 2022-02-10 ENCOUNTER — Encounter: Payer: 59 | Admitting: Occupational Therapy

## 2022-02-10 VITALS — BP 127/76 | HR 66

## 2022-02-10 DIAGNOSIS — R293 Abnormal posture: Secondary | ICD-10-CM

## 2022-02-10 DIAGNOSIS — R2689 Other abnormalities of gait and mobility: Secondary | ICD-10-CM | POA: Diagnosis not present

## 2022-02-10 DIAGNOSIS — R278 Other lack of coordination: Secondary | ICD-10-CM | POA: Diagnosis not present

## 2022-02-10 DIAGNOSIS — M6281 Muscle weakness (generalized): Secondary | ICD-10-CM

## 2022-02-10 DIAGNOSIS — E1165 Type 2 diabetes mellitus with hyperglycemia: Secondary | ICD-10-CM | POA: Diagnosis not present

## 2022-02-10 DIAGNOSIS — R2681 Unsteadiness on feet: Secondary | ICD-10-CM | POA: Diagnosis not present

## 2022-02-10 DIAGNOSIS — R41844 Frontal lobe and executive function deficit: Secondary | ICD-10-CM | POA: Diagnosis not present

## 2022-02-10 DIAGNOSIS — R29818 Other symptoms and signs involving the nervous system: Secondary | ICD-10-CM | POA: Diagnosis not present

## 2022-02-10 DIAGNOSIS — Z794 Long term (current) use of insulin: Secondary | ICD-10-CM | POA: Diagnosis not present

## 2022-02-10 DIAGNOSIS — R41840 Attention and concentration deficit: Secondary | ICD-10-CM | POA: Diagnosis not present

## 2022-02-10 NOTE — Therapy (Signed)
OUTPATIENT PHYSICAL THERAPY NEURO TREATMENT   Patient Name: Deanna Rowe MRN: [id number] DOB:1956-02-21, 66 y.o., female Today's Date: 02/10/2022   PCP: Aretta Nip, MD REFERRING PROVIDER: Ward Givens, NP    PT End of Session - 02/10/22 0934     Visit Number 12    Number of Visits 17    Date for PT Re-Evaluation 03/15/22   due to delay in scheduling/pt going on vacation   Authorization Type [facility]    PT Start Time 2751    PT Stop Time 1014    PT Time Calculation (min) 43 min    Equipment Utilized During Treatment Gait belt    Activity Tolerance Patient tolerated treatment well    Behavior During Therapy [facility] for tasks assessed/performed                    Past Medical History:  Diagnosis Date   Anemia    Anxiety    Arthritis    L HIP   Asthma    Avascular necrosis of hip ([place])    LEFT   Back pain    Chest pain    Constipation    Depression    Diabetes mellitus    Diabetes mellitus, type II ([place])    Dry cough    Edema, lower extremity    Gait abnormality 11/18/2019   GERD (gastroesophageal reflux disease)    High cholesterol    Hypertension    Insomnia    takes Ambien nightly   Joint pain    Kidney disease    Kidney disease    Obesity    Parkinsonism 03/02/2017   PONV (postoperative nausea and vomiting)    RLS (restless legs syndrome) 01/15/2018   Sleep apnea    Swallowing difficulty    Thyroid disease    TIA (transient ischemic attack) 2012   no residual problems   Urgency incontinence    Vitamin D deficiency    Past Surgical History:  Procedure Laterality Date   DILATION AND CURETTAGE OF UTERUS     ESOPHAGEAL MANOMETRY N/A 09/03/2012   Procedure: ESOPHAGEAL MANOMETRY (EM);  Surgeon: Garlan Fair, MD;  Location: [facility];  Service: Endoscopy;  Laterality: N/A;   ESOPHAGEAL MANOMETRY N/A 06/19/2017   Procedure: ESOPHAGEAL MANOMETRY (EM);  Surgeon: Clarene Essex, MD;  Location: [facility];  Service: Endoscopy;   Laterality: N/A;   EXPLORATORY LAPAROTOMY     GANGLION CYST EXCISION     JOINT REPLACEMENT  2011   rt total hip   KNEE ARTHROSCOPY     PILONIDAL CYST / SINUS EXCISION     TOTAL HIP ARTHROPLASTY Left 10/09/2013   Procedure: LEFT TOTAL HIP ARTHROPLASTY ANTERIOR APPROACH;  Surgeon: [name], MD;  Location: [facility];  Service: Orthopedics;  Laterality: Left;   Patient Active Problem List   Diagnosis Date Noted   Allergic conjunctivitis of both eyes 07/06/2020   Gait abnormality 11/18/2019   OSA on CPAP 06/04/2019   Pain in joint of left shoulder 03/28/2019   Seasonal allergic conjunctivitis 02/07/2019   Chronic intermittent hypoxia with obstructive sleep apnea 01/25/2019   Severe obstructive sleep apnea 01/25/2019   Excessive daytime sleepiness 01/03/2019   Non-restorative sleep 01/03/2019   Nocturia more than twice per night 01/03/2019   Weight gain, abnormal 01/03/2019   Snorings 01/03/2019   Trochanteric bursitis of right hip 04/27/2018   Encounter for routine screening for malformation using ultrasonics 02/08/2018   Abnormal weight gain 02/08/2018  Acid indigestion 02/08/2018   Acute antritis 02/08/2018   Class 2 severe obesity with serious comorbidity and body mass index (BMI) of 39.0 to 39.9 in adult ([place]) 02/08/2018   Perennial allergic rhinitis 02/08/2018   Antiphospholipid syndrome ([place]) 02/08/2018   Arthralgia of hip or thigh 02/08/2018   Asthma, cough variant 02/08/2018   Vitamin D deficiency 02/08/2018   Backhand tennis elbow 02/08/2018   Cannot sleep 02/08/2018   Chronic kidney disease (CKD), stage III (moderate) ([place]) 02/08/2018   Current drug use 02/08/2018   Depression, major, single episode, in partial remission ([place]) 02/08/2018   Diabetes mellitus with polyneuropathy ([place]) 02/08/2018   Difficulty hearing 02/08/2018   Disturbance of skin sensation 02/08/2018   Hypertension associated with diabetes ([place]) 02/08/2018   Extremity pain 02/08/2018   Facet  syndrome, lumbar 02/08/2018   Factor V Leiden ([place]) 02/08/2018   Fungal infection of nail 02/08/2018   Gastroenteritis and colitis, viral 02/08/2018   Hypoglycemia 02/08/2018   Menopause 02/08/2018   Other long term (current) drug therapy 02/08/2018   Other osteonecrosis, unspecified femur ([place]) 02/08/2018   Pure hypercholesterolemia 02/08/2018   Recurrent major depressive episodes ([place]) 02/08/2018   Syncope and collapse 02/08/2018   Not well controlled moderate persistent asthma 02/08/2018   Cough, persistent 02/08/2018   Restless leg syndrome 01/15/2018   Degeneration of lumbar intervertebral disc 11/21/2017   Tremor of right hand 03/02/2017   Parkinson disease 03/02/2017   UTI (urinary tract infection) 12/01/2015   Type 2 diabetes mellitus with stage 3b chronic kidney disease, with long-term current use of insulin ([place]) 06/03/2015   Fast heart beat 04/09/2015   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 2 diabetes mellitus ([place]) 04/24/2014   Postoperative anemia due to acute blood loss 10/10/2013   Avascular necrosis of bone of left hip ([place]) 10/09/2013   Arthritis of pelvic region, degenerative 10/09/2013   Breath shortness 06/18/2013   Diabetes mellitus type 2, uncontrolled 05/31/2013   Intermittent claudication ([place]) 04/04/2013   Anxiety    Gastroesophageal reflux disease 09/12/2012   Clinical depression 02/21/2012   Stroke ([place]) 02/11/2012   History of revision of total replacement of right hip joint 04/04/2009    ONSET DATE: 12/02/2021   REFERRING DIAG: G20 (ICD-10-CM) - Parkinson disease ([place]) R29.6 (ICD-10-CM) - Falls frequently   THERAPY DIAG:  Other abnormalities of gait and mobility  Unsteadiness on feet  Abnormal posture  Muscle weakness (generalized)  Rationale for Evaluation and Treatment Rehabilitation  SUBJECTIVE:  SUBJECTIVE STATEMENT: Had a couple of almost falls in the bathroom, almost tripped over the rug. Her husband has since removed these rugs. Was going backwards. Has been taking her medication on time. Uses her SPC in the house and the rollator out in the community.  Pt reporting she feels like she has been leaning more to the R this morning   Pt accompanied by: self  PERTINENT HISTORY: Parkinsonism (2018),TIA, thyroid disease, kidney disease, HTN, HLD, GERD, DMII, depression, L hip avascular necrosis, anxiety, anemia, R THA 2011, L THA 2015, knee arthroscopy   PAIN:  Are you having pain? No   PRECAUTIONS: Fall, No driving.   FALLS: Has patient fallen in last 6 months? Yes. Number of falls 10  VITALS Vitals:   02/10/22 0959  BP: 127/76  Pulse: 66     PLOF: Independent with community mobility with device  PATIENT GOALS Wants to stop falling, improve balance.    OBJECTIVE:   COGNITION: Overall cognitive status: Impaired  TODAY'S TREATMENT:  Ther Act Goal Assessment: 5x sit <> stand : 16 seconds with no UE support, pt with knees flexed in standing and not full knee extension   Gait speed with SPC: 14.2 seconds with SPC = 2.31 ft/sec   [facility] PT Assessment - 02/10/22 0001       Mini-BESTest   Sit To Stand Normal: Comes to stand without use of hands and stabilizes independently.    Rise to Toes Normal: Stable for 3 s with maximum height.    Stand on one leg (left) Severe: Unable   <1 second   Stand on one leg (right) Severe: Unable   <1 second   Stand on one leg - lowest score 0    Compensatory Stepping Correction - Forward Normal: Recovers independently with a single, large step (second realignement is allowed).    Compensatory Stepping Correction - Backward Moderate: More than one step is required to recover equilibrium   3-4 steps   Compensatory Stepping Correction - Left Lateral Severe: Falls, or cannot step   multiple steps, PT needing  to prevent pt from losing her balance   Compensatory Stepping Correction - Right Lateral Normal: Recovers independently with 1 step (crossover or lateral OK)    Stepping Corredtion Lateral - lowest score 0    Stance - Feet together, eyes open, firm surface  Normal: 30s    Stance - Feet together, eyes closed, foam surface  Moderate: < 30s   3-4 seconds, loses balance to R side   Incline - Eyes Closed Normal: Stands independently 30s and aligns with gravity    Change in Gait Speed Moderate: Unable to change walking speed or signs of imbalance    Walk with head turns - Horizontal Moderate: performs head turns with reduction in gait speed.    Walk with pivot turns Moderate:Turns with feet close SLOW (>4 steps) with good balance.    Step over obstacles Normal: Able to step over box with minimal change of gait speed and with good balance.    Timed UP & GO with Dual Task Moderate: Dual Task affects either counting OR walking (>10%) when compared to the TUG without Dual Task.    Mini-BEST total score 18      Timed Up and Go Test   Normal TUG (seconds) 14.72    Cognitive TUG (seconds) 18.8   counting backwards by 1s, pt unable to do 3s, took 3 attempts to perform correctly  GAIT: Gait pattern:  genu valgus of RLE, step through pattern, decreased stride length, Right foot flat, Left foot flat, decreased trunk rotation, trunk flexed, and narrow BOS Distance walked: Clinic distances. Assistive device utilized: Environmental consultant - 4 wheeled Level of assistance: SBA Comments: Pt able to incr stride length with use of rollator, cued for posture. Pt needs cues for proper brake management before sit <> stands and to turn all the way around with rollator before sitting down to mat table table. Pt initially just pushed it off to the side and left it there and then started to walk over to the mat table.   Pt needing cues throughout session to turn all the way and feeling the mat on legs before sitting  down as one time pt did not turn all the way and almost missed the mat table.   PATIENT EDUCATION: Education details: Results of goals, areas to keep working on in therapy.  Person educated: Patient Education method: Explanation Education comprehension: verbalized understanding and needs further education   HOME EXERCISE PROGRAM: Access Code: [email or web address] URL: [email or web address] Date: 12/22/2021 Prepared by: Mickie Bail Plaster  Exercises - Sit to Stand Without Arm Support  - 1 x daily - 7 x weekly - 2 sets - 5-6 reps - Heel Raises with Counter Support  - 1 x daily - 7 x weekly - 3 sets - 10 reps - Step sideways with arms reaching at counter   - 1 x daily - 7 x weekly - 3 sets - 10 reps - Alternating Step Backward with Support  - 1 x daily - 7 x weekly - 3 sets - 10 reps - Standing Quarter Turn with Counter Support  - 1 x daily - 7 x weekly - 3 sets - 10 reps - Seated Hamstring Stretch  - 1 x daily - 7 x weekly - 1 sets - 3-4 reps - 30 second hold - Calf stretch  - 1 x daily - 7 x weekly - 1 sets - 3-4 reps - 30-45 second hold - Alternating Step Backward with Support  - 1 x daily - 7 x weekly - 3 sets - 10 reps  Standing PWR moves    GOALS: Goals reviewed with patient? Yes  SHORT TERM GOALS: Target date: 01/12/2022  Pt will be independent with initial HEP in order to build upon functional gains made in therapy.  Baseline: Goal status: MET  2.  Pt will improve MiniBest to 19/28 for decreased fall risk and improvement with compensatory stepping strategies.   Baseline: 16/28; 19/28  Goal status: MET  3.  Pt will improve 5x sit<>stand to less than or equal to 24 sec with no UE support to demonstrate improved functional strength and transfer efficiency.  Baseline: 28.59 seconds with no UE support; 13.21s without UE support Goal status: MET  4.  Pt will improve gait speed with LRAD to at least 2.3 ft/sec in order to demo improved community mobility.  Baseline: 16.58  seconds = 1.95 ft/sec with SPC; 2.61f/s w/SPC Goal status: MET  5.  Pt will improve manual TUG to 14 seconds or less in order to decr fall risk. Baseline: 16 seconds with SPC; 21.93s w/SPC Goal status: NOT MET   LONG TERM GOALS: Target date: 02/09/2022  Pt will be independent with final HEP for PD specific deficits/balance in order to build upon functional gains made in therapy. Baseline:  Goal status: IN PROGRESS  2.  Pt will improve MiniBest to 23/28 for decreased fall risk  and improvement with compensatory stepping strategies.   Baseline: 16/28; 18/28 on 02/10/22 Goal status: NOT MET  3.  Pt will improve gait speed with LRAD to at least 2.8 ft/sec in order to demo improved community mobility.  Baseline: 16.58 seconds = 1.95 ft/sec with SPC; 2.5 ft/s w/ SPC on 10/10   14.2 seconds with SPC = 2.31 ft/sec Goal status: NOT MET   4.  Pt will improve 5x sit<>stand to less than or equal to 18 sec with no UE support to demonstrate improved functional strength and transfer efficiency.  Baseline: 28.59 seconds with no UE support; 13.21s on 10/10,   16 seconds on 02/10/22 Goal status: NOT MET  5.  Pt will verbalize understanding of local Parkinson's disease resources, including options for continued community fitness. Baseline:  Goal status: IN PROGRESS  LONG TERM GOALS: Target date: 03/15/22 (UPDATED DATE)  Pt will be independent with final HEP for PD specific deficits/balance in order to build upon functional gains made in therapy. Baseline:  Goal status: IN PROGRESS  2.  Pt will improve MiniBest to 22/28 for decreased fall risk and improvement with compensatory stepping strategies.   Baseline: 16/28; 18/28 on 02/10/22 Goal status: REVISED  3.  Pt will improve gait speed with LRAD to at least 2.6 ft/sec in order to demo improved community mobility.  Baseline: 16.58 seconds = 1.95 ft/sec with SPC; 2.5 ft/s w/ SPC on 10/10   14.2 seconds with SPC = 2.31 ft/sec Goal status:  REVISED  4.  Pt will improve 5x sit<>stand to less than or equal to 14 sec with no UE support to demonstrate improved functional strength and transfer efficiency.  Baseline: 28.59 seconds with no UE support; 13.21s on 10/10,   16 seconds on 02/10/22 Goal status: REVISED  5.  Pt will verbalize understanding of local Parkinson's disease resources, including options for continued community fitness. Baseline:  Goal status: IN PROGRESS   ASSESSMENT:  CLINICAL IMPRESSION: Today's skilled session focused on assessing pt's LTGs. Pt did not meet 3 out of 5 LTGs. Pt's miniBEST today was an 18/28 and previously when last assessed was 19/28. Pt more challenged today with SLS and unable to hold for >1 second. Pt needing to take multiple steps for posterior stepping strategy and with L lateral stepping, PT needing to provide assist for balance and pt unable to catch herself. Pt with slowing down of 5x sit <> stand time today at 16 seconds (when last assessed was 13.21 seconds). Pt now using a rollator for community gait outside the house due to recent falls in the backwards direction and use of SPC at all times indoors. Pt's 2 remaining LTGs are in progress. LTG date updated due to POC extending to 03/15/22 for 12 weeks.     OBJECTIVE IMPAIRMENTS Abnormal gait, decreased activity tolerance, decreased balance, decreased coordination, difficulty walking, decreased strength, impaired flexibility, impaired tone, and postural dysfunction.   ACTIVITY LIMITATIONS bending, squatting, stairs, transfers, and locomotion level  PARTICIPATION LIMITATIONS: driving and community activity  PERSONAL FACTORS Behavior pattern, Past/current experiences, Time since onset of injury/illness/exacerbation, and 3+ comorbidities: Parkinsonism (2018),TIA, thyroid disease, kidney disease, HTN, HLD, GERD, DMII, depression, L hip avascular necrosis, anxiety, anemia, R THA 2011, L THA 2015, knee arthroscopy   are also affecting patient's  functional outcome.   REHAB POTENTIAL: Good  CLINICAL DECISION MAKING: Evolving/moderate complexity  EVALUATION COMPLEXITY: Moderate  PLAN: PT FREQUENCY: 2x/week  PT DURATION: 12 weeks  PLANNED INTERVENTIONS: Therapeutic exercises, Therapeutic activity, Neuromuscular re-education, Balance  training, Gait training, Patient/Family education, Self Care, Stair training, Vestibular training, DME instructions, Manual therapy, and Re-evaluation  PLAN FOR NEXT SESSION: 43 old LTG section so its not so cluttered. Review HEP and make sure pt is doing correctly.  Work on stepping strategies (posterior), wider BOS and staggered stance w/reaching tasks. functional strength. Sit <>stands on wedge, scifit for endurance,forward lunge over beam, reaching out of BOS, resisted walking, sit <>stands w/slam ball. Review standing PWR moves as needed. Dual tasking   Arliss Journey, PT, DPT  02/10/2022, 1:05 PM

## 2022-02-15 ENCOUNTER — Ambulatory Visit: Payer: 59 | Admitting: Physical Therapy

## 2022-02-15 ENCOUNTER — Ambulatory Visit: Payer: 59 | Admitting: Occupational Therapy

## 2022-02-15 DIAGNOSIS — R2681 Unsteadiness on feet: Secondary | ICD-10-CM | POA: Diagnosis not present

## 2022-02-15 DIAGNOSIS — R41844 Frontal lobe and executive function deficit: Secondary | ICD-10-CM

## 2022-02-15 DIAGNOSIS — R41840 Attention and concentration deficit: Secondary | ICD-10-CM | POA: Diagnosis not present

## 2022-02-15 DIAGNOSIS — R29818 Other symptoms and signs involving the nervous system: Secondary | ICD-10-CM

## 2022-02-15 DIAGNOSIS — M6281 Muscle weakness (generalized): Secondary | ICD-10-CM | POA: Diagnosis not present

## 2022-02-15 DIAGNOSIS — R2689 Other abnormalities of gait and mobility: Secondary | ICD-10-CM

## 2022-02-15 DIAGNOSIS — R293 Abnormal posture: Secondary | ICD-10-CM | POA: Diagnosis not present

## 2022-02-15 DIAGNOSIS — R278 Other lack of coordination: Secondary | ICD-10-CM | POA: Diagnosis not present

## 2022-02-15 NOTE — Therapy (Signed)
OUTPATIENT OCCUPATIONAL THERAPY [facility]   Patient Name: Deanna Rowe MRN: [id number] DOB:09/18/1955, 66 y.o., female Today's Date: 02/15/2022  PCP: Dr. Radene Ou REFERRING PROVIDER: Ward Givens NP   OT End of Session - 02/15/22 1216     Visit Number 7    Number of Visits 25    Date for OT Re-Evaluation 03/17/22    Authorization Type [facility]    Authorization - Visit Number 7    Authorization - Number of Visits 12    OT Start Time [phone]    OT Stop Time 1014    OT Time Calculation (min) 40 min    Activity Tolerance Patient tolerated treatment well    Behavior During Therapy [facility] for tasks assessed/performed               Past Medical History:  Diagnosis Date   Anemia    Anxiety    Arthritis    L HIP   Asthma    Avascular necrosis of hip ([place])    LEFT   Back pain    Chest pain    Constipation    Depression    Diabetes mellitus    Diabetes mellitus, type II (HCC)    Dry cough    Edema, lower extremity    Gait abnormality 11/18/2019   GERD (gastroesophageal reflux disease)    High cholesterol    Hypertension    Insomnia    takes Ambien nightly   Joint pain    Kidney disease    Kidney disease    Obesity    Parkinsonism 03/02/2017   PONV (postoperative nausea and vomiting)    RLS (restless legs syndrome) 01/15/2018   Sleep apnea    Swallowing difficulty    Thyroid disease    TIA (transient ischemic attack) 2012   no residual problems   Urgency incontinence    Vitamin D deficiency    Past Surgical History:  Procedure Laterality Date   DILATION AND CURETTAGE OF UTERUS     ESOPHAGEAL MANOMETRY N/A 09/03/2012   Procedure: ESOPHAGEAL MANOMETRY (EM);  Surgeon: Garlan Fair, MD;  Location: [facility];  Service: Endoscopy;  Laterality: N/A;   ESOPHAGEAL MANOMETRY N/A 06/19/2017   Procedure: ESOPHAGEAL MANOMETRY (EM);  Surgeon: Clarene Essex, MD;  Location: [facility];  Service: Endoscopy;  Laterality: N/A;   EXPLORATORY LAPAROTOMY      GANGLION CYST EXCISION     JOINT REPLACEMENT  2011   rt total hip   KNEE ARTHROSCOPY     PILONIDAL CYST / SINUS EXCISION     TOTAL HIP ARTHROPLASTY Left 10/09/2013   Procedure: LEFT TOTAL HIP ARTHROPLASTY ANTERIOR APPROACH;  Surgeon: [name], MD;  Location: [facility];  Service: Orthopedics;  Laterality: Left;   Patient Active Problem List   Diagnosis Date Noted   Allergic conjunctivitis of both eyes 07/06/2020   Gait abnormality 11/18/2019   OSA on CPAP 06/04/2019   Pain in joint of left shoulder 03/28/2019   Seasonal allergic conjunctivitis 02/07/2019   Chronic intermittent hypoxia with obstructive sleep apnea 01/25/2019   Severe obstructive sleep apnea 01/25/2019   Excessive daytime sleepiness 01/03/2019   Non-restorative sleep 01/03/2019   Nocturia more than twice per night 01/03/2019   Weight gain, abnormal 01/03/2019   Snorings 01/03/2019   Trochanteric bursitis of right hip 04/27/2018   Encounter for routine screening for malformation using ultrasonics 02/08/2018   Abnormal weight gain 02/08/2018   Acid indigestion 02/08/2018   Acute antritis 02/08/2018  Class 2 severe obesity with serious comorbidity and body mass index (BMI) of 39.0 to 39.9 in adult [facility]) 02/08/2018   Perennial allergic rhinitis 02/08/2018   Antiphospholipid syndrome ([place]) 02/08/2018   Arthralgia of hip or thigh 02/08/2018   Asthma, cough variant 02/08/2018   Vitamin D deficiency 02/08/2018   Backhand tennis elbow 02/08/2018   Cannot sleep 02/08/2018   Chronic kidney disease (CKD), stage III (moderate) ([place]) 02/08/2018   Current drug use 02/08/2018   Depression, major, single episode, in partial remission ([place]) 02/08/2018   Diabetes mellitus with polyneuropathy ([place]) 02/08/2018   Difficulty hearing 02/08/2018   Disturbance of skin sensation 02/08/2018   Hypertension associated with diabetes ([place]) 02/08/2018   Extremity pain 02/08/2018   Facet syndrome, lumbar 02/08/2018   Factor V Leiden  ([place]) 02/08/2018   Fungal infection of nail 02/08/2018   Gastroenteritis and colitis, viral 02/08/2018   Hypoglycemia 02/08/2018   Menopause 02/08/2018   Other long term (current) drug therapy 02/08/2018   Other osteonecrosis, unspecified femur ([place]) 02/08/2018   Pure hypercholesterolemia 02/08/2018   Recurrent major depressive episodes ([place]) 02/08/2018   Syncope and collapse 02/08/2018   Not well controlled moderate persistent asthma 02/08/2018   Cough, persistent 02/08/2018   Restless leg syndrome 01/15/2018   Degeneration of lumbar intervertebral disc 11/21/2017   Tremor of right hand 03/02/2017   Parkinson disease 03/02/2017   UTI (urinary tract infection) 12/01/2015   Type 2 diabetes mellitus with stage 3b chronic kidney disease, with long-term current use of insulin ([place]) 06/03/2015   Fast heart beat 04/09/2015   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 2 diabetes mellitus ([place]) 04/24/2014   Postoperative anemia due to acute blood loss 10/10/2013   Avascular necrosis of bone of left hip ([place]) 10/09/2013   Arthritis of pelvic region, degenerative 10/09/2013   Breath shortness 06/18/2013   Diabetes mellitus type 2, uncontrolled 05/31/2013   Intermittent claudication ([place]) 04/04/2013   Anxiety    Gastroesophageal reflux disease 09/12/2012   Clinical depression 02/21/2012   Stroke ([place]) 02/11/2012   History of revision of total replacement of right hip joint 04/04/2009    ONSET DATE: 12/15/21  REFERRING DIAG: Parkinsonism  THERAPY DIAG:  Muscle weakness (generalized)  Other lack of coordination  Other symptoms and signs involving the nervous system  Attention and concentration deficit  Frontal lobe and executive function deficit  Unsteadiness on feet  Abnormal posture  Other abnormalities of gait and mobility  Rationale for Evaluation and Treatment Rehabilitation  SUBJECTIVE:   SUBJECTIVE STATEMENT: Pt reports no falls since last visit Pt accompanied by: self,  husband  PERTINENT HISTORY: PERTINENT HISTORY: Parkinsonism (2018),TIA, thyroid disease, kidney disease, HTN, HLD, GERD, DMII, depression, L hip avascular necrosis, anxiety, anemia, R THA 2011, L THA 2015, knee arthroscopy   PRECAUTIONS: Fall  WEIGHT BEARING RESTRICTIONS No  PAIN:  Are you having pain? No (only soreness from recent fall)   FALLS: Has patient fallen in last 6 months? Yes. Number of falls 5  LIVING ENVIRONMENT: Lives with: lives with their spouse Lives in: House/apartment  PLOF: Independent  PATIENT GOALS to be able style hair  OBJECTIVE:   HAND DOMINANCE: Right   FUNCTIONAL OUTCOME MEASURES: Fastening/unfastening 3 buttons: 50.68 Physical performance test: PPT#2 (simulated eating) 15.03 & PPT#4 (donning/doffing jacket): 13.34  COORDINATION: 9 Hole Peg test: Right: 36.91 sec; Left: 38.16 sec Box and Blocks:  Right 33blocks, Left 37blocks   TODAY'S TREATMENT:   Pt's husband was present today. PWR! Moves seated basic 4 - pt  demo each x 10 w/ min cues for large amplitude movements. Cues to look at hands and for performance. Functional reaching with feet staggered / angled to place and remove laundry from dryer to place on rollator with left hand supported on dryer, and RUE removing laundry  from low surface,  close supervision and min v.c for positioning. Pt was encouraged to use walker at home. Pt was again  encouraged to call her psychologist/ psychiatrist due to feeling down.  Safety for sit to stand using PWR! Up reviewed with pt/ husband, pt returned demonstration. Therapist discussed pt visual deficits, inability to track inferiorly and safety/ functional issues related to this with pt/ husband.         PATIENT EDUCATION: Education details: PWR! Basic 4 supine reviewed Person educated: Patient Education method: explanation, demonstration Education comprehension: verbalized understanding, returned demonstration, and verbal cues required     HOME EXERCISE PROGRAM: 12/27/21: PWR! Seated, coordination HEP, writing strategies 12/30/21: PWR! Hands  02/03/22- PWR! Supine basic 4 02/15/22- recommendation for staggered feet and use of rollator for laundry, education regarding pt visual deficits, inability to track inferiorly.   ASSESSMENT:  CLINICAL IMPRESSION: Pt is progressing towards goals. She demonstrates understanding of positioning for safety with laundry. PERFORMANCE DEFICITS in functional skills including ADLs, IADLs, coordination, dexterity, proprioception, sensation, tone, ROM, strength, pain, flexibility, FMC, GMC, mobility, balance, endurance, decreased knowledge of precautions, decreased knowledge of use of DME, and UE functional use, bradykinesia , cognitive skills including attention, energy/drive, learn, memory, problem solving, safety awareness, thought, and understand, and psychosocial skills including coping strategies, environmental adaptation, habits, interpersonal interactions, and routines and behaviors.   IMPAIRMENTS are limiting patient from ADLs, IADLs, play, leisure, and social participation.   COMORBIDITIES may have co-morbidities  that affects occupational performance. Patient will benefit from skilled OT to address above impairments and improve overall function.  MODIFICATION OR ASSISTANCE TO COMPLETE EVALUATION: No modification of tasks or assist necessary to complete an evaluation.  OT OCCUPATIONAL PROFILE AND HISTORY: Detailed assessment: Review of records and additional review of physical, cognitive, psychosocial history related to current functional performance.  CLINICAL DECISION MAKING: LOW - limited treatment options, no task modification necessary  REHAB POTENTIAL: Good  EVALUATION COMPLEXITY: Low     GOALS: Goals reviewed with patient? No  SHORT TERM GOALS: Target date: 02/21/22- extended due to scheduling  I with PD specific HEP Baseline: Goal status: met  2.  Pt will  verbalize understanding of adapted strategies to maximize safety and I with ADLs/ IADLs .  Baseline:  Goal status: ongoing  3.  Pt will demonstrate improved bilateral UE functional use as evidenced by increasing box/ blocks score by 3 blocks Baseline: RUE 33, LUE 37 Goal status: INITIAL  4.  Pt will demonstrate improved ease with feeding as evidenced by decreasing PPT#2 (self feeding) by 3 secs Baseline: 15.03 Goal status: ongoing  5.  Pt will report increased ease with styling her hair Baseline: Pt reports difficulty Goal status: ongoing    LONG TERM GOALS: Target date: 03/17/22-  1.Pt will verbalize understanding of ways to prevent future PD related complications and PD community resources.  Baseline:  Goal status: INITIAL  2.  Pt will demonstrate improved fine motor coordination for ADLs as evidenced by decreasing bilateral  9 hole peg test score for by 3 secs  Baseline: RUE 36.91, LUE 38.16 Goal status: INITIAL  3.  Pt will demonstrate understanding of memory compensations and ways to keep thinking skills sharp  Baseline:    Goal status: INITIAL  4.  Pt will demonstrate improved ease with fastening buttons as evidenced by decreasing 3 button/ unbutton time to 47 secs or less  Baseline: 50.68 Goal status: INITIAL   PLAN: OT FREQUENCY: 2x/week  OT DURATION: 12 weeks POC written for 12 weeks to account for scheduling  PLANNED INTERVENTIONS: self care/ADL training, therapeutic exercise, therapeutic activity, neuromuscular re-education, manual therapy, passive range of motion, balance training, stair training, functional mobility training, aquatic therapy, ultrasound, paraffin, fluidotherapy, moist heat, cryotherapy, contrast bath, patient/family education, cognitive remediation/compensation, visual/perceptual remediation/compensation, energy conservation, coping strategies training, DME and/or AE instructions, and Re-evaluation  RECOMMENDED OTHER SERVICES:  PT  CONSULTED AND AGREED WITH PLAN OF CARE: Patient  PLAN FOR NEXT SESSION: check short term goals,continue ADL strategies,  compensation for visual deficits, stepping strategies, functional reaching w/ balance, r, pt/ caregiver ed if husband is in   [place], [place] 02/15/2022, 12:26 PM

## 2022-02-17 ENCOUNTER — Ambulatory Visit: Payer: 59 | Admitting: Physical Therapy

## 2022-02-17 ENCOUNTER — Ambulatory Visit: Payer: 59 | Admitting: Occupational Therapy

## 2022-02-21 ENCOUNTER — Other Ambulatory Visit (HOSPITAL_BASED_OUTPATIENT_CLINIC_OR_DEPARTMENT_OTHER): Payer: Self-pay

## 2022-02-21 DIAGNOSIS — K220 Achalasia of cardia: Secondary | ICD-10-CM | POA: Diagnosis not present

## 2022-02-21 DIAGNOSIS — E7800 Pure hypercholesterolemia, unspecified: Secondary | ICD-10-CM | POA: Diagnosis not present

## 2022-02-21 DIAGNOSIS — M79606 Pain in leg, unspecified: Secondary | ICD-10-CM | POA: Diagnosis not present

## 2022-02-21 DIAGNOSIS — N183 Chronic kidney disease, stage 3 unspecified: Secondary | ICD-10-CM | POA: Diagnosis not present

## 2022-02-21 DIAGNOSIS — Z794 Long term (current) use of insulin: Secondary | ICD-10-CM | POA: Diagnosis not present

## 2022-02-21 DIAGNOSIS — G20A1 Parkinson's disease without dyskinesia, without mention of fluctuations: Secondary | ICD-10-CM | POA: Diagnosis not present

## 2022-02-21 DIAGNOSIS — Z0000 Encounter for general adult medical examination without abnormal findings: Secondary | ICD-10-CM | POA: Diagnosis not present

## 2022-02-21 DIAGNOSIS — F339 Major depressive disorder, recurrent, unspecified: Secondary | ICD-10-CM | POA: Diagnosis not present

## 2022-02-21 DIAGNOSIS — I509 Heart failure, unspecified: Secondary | ICD-10-CM | POA: Diagnosis not present

## 2022-02-21 DIAGNOSIS — I10 Essential (primary) hypertension: Secondary | ICD-10-CM | POA: Diagnosis not present

## 2022-02-21 DIAGNOSIS — Z6834 Body mass index (BMI) 34.0-34.9, adult: Secondary | ICD-10-CM | POA: Diagnosis not present

## 2022-02-21 DIAGNOSIS — E1165 Type 2 diabetes mellitus with hyperglycemia: Secondary | ICD-10-CM | POA: Diagnosis not present

## 2022-02-21 MED ORDER — HYDROCODONE-ACETAMINOPHEN 5-325 MG PO TABS
1.0000 | ORAL_TABLET | Freq: Four times a day (QID) | ORAL | 0 refills | Status: DC | PRN
  Filled 2022-02-21: qty 40, 10d supply, fill #0

## 2022-02-22 ENCOUNTER — Ambulatory Visit (HOSPITAL_BASED_OUTPATIENT_CLINIC_OR_DEPARTMENT_OTHER)
Admission: RE | Admit: 2022-02-22 | Discharge: 2022-02-22 | Disposition: A | Discharge status: Home / Self Care | Payer: 59 | Source: Ambulatory Visit | Attending: Internal Medicine | Admitting: Internal Medicine

## 2022-02-22 ENCOUNTER — Other Ambulatory Visit (HOSPITAL_BASED_OUTPATIENT_CLINIC_OR_DEPARTMENT_OTHER): Payer: Self-pay | Admitting: Internal Medicine

## 2022-02-22 ENCOUNTER — Other Ambulatory Visit: Payer: Self-pay | Admitting: Neurology

## 2022-02-22 ENCOUNTER — Other Ambulatory Visit (HOSPITAL_BASED_OUTPATIENT_CLINIC_OR_DEPARTMENT_OTHER): Payer: Self-pay

## 2022-02-22 ENCOUNTER — Other Ambulatory Visit (HOSPITAL_COMMUNITY): Payer: Self-pay

## 2022-02-22 DIAGNOSIS — M7732 Calcaneal spur, left foot: Secondary | ICD-10-CM | POA: Diagnosis not present

## 2022-02-22 DIAGNOSIS — G8929 Other chronic pain: Secondary | ICD-10-CM | POA: Insufficient documentation

## 2022-02-22 DIAGNOSIS — M79672 Pain in left foot: Secondary | ICD-10-CM | POA: Diagnosis not present

## 2022-02-22 DIAGNOSIS — E042 Nontoxic multinodular goiter: Secondary | ICD-10-CM | POA: Diagnosis not present

## 2022-02-22 DIAGNOSIS — M79675 Pain in left toe(s): Secondary | ICD-10-CM | POA: Diagnosis not present

## 2022-02-22 DIAGNOSIS — M7731 Calcaneal spur, right foot: Secondary | ICD-10-CM | POA: Diagnosis not present

## 2022-02-22 DIAGNOSIS — M79671 Pain in right foot: Secondary | ICD-10-CM | POA: Insufficient documentation

## 2022-02-22 DIAGNOSIS — E1151 Type 2 diabetes mellitus with diabetic peripheral angiopathy without gangrene: Secondary | ICD-10-CM | POA: Diagnosis not present

## 2022-02-22 DIAGNOSIS — E119 Type 2 diabetes mellitus without complications: Secondary | ICD-10-CM | POA: Diagnosis not present

## 2022-02-22 DIAGNOSIS — N1832 Chronic kidney disease, stage 3b: Secondary | ICD-10-CM | POA: Diagnosis not present

## 2022-02-22 LAB — COMPREHENSIVE METABOLIC PANEL WITH GFR: Calcium: 10 (ref 8.7–10.7)

## 2022-02-22 MED ORDER — CARBIDOPA-LEVODOPA 25-250 MG PO TABS
ORAL_TABLET | ORAL | 1 refills | Status: DC
  Filled 2022-02-22: qty 180, 90d supply, fill #0
  Filled 2022-05-23: qty 180, 90d supply, fill #1

## 2022-02-25 ENCOUNTER — Other Ambulatory Visit (HOSPITAL_COMMUNITY): Payer: Self-pay

## 2022-02-28 ENCOUNTER — Encounter (HOSPITAL_BASED_OUTPATIENT_CLINIC_OR_DEPARTMENT_OTHER): Payer: Self-pay

## 2022-02-28 ENCOUNTER — Other Ambulatory Visit (HOSPITAL_BASED_OUTPATIENT_CLINIC_OR_DEPARTMENT_OTHER): Payer: Self-pay

## 2022-03-01 ENCOUNTER — Other Ambulatory Visit (HOSPITAL_BASED_OUTPATIENT_CLINIC_OR_DEPARTMENT_OTHER): Payer: Self-pay

## 2022-03-01 ENCOUNTER — Ambulatory Visit: Payer: 59 | Admitting: Occupational Therapy

## 2022-03-01 ENCOUNTER — Ambulatory Visit: Payer: 59 | Admitting: Physical Therapy

## 2022-03-01 DIAGNOSIS — R2681 Unsteadiness on feet: Secondary | ICD-10-CM

## 2022-03-01 DIAGNOSIS — R293 Abnormal posture: Secondary | ICD-10-CM

## 2022-03-01 DIAGNOSIS — R41840 Attention and concentration deficit: Secondary | ICD-10-CM | POA: Diagnosis not present

## 2022-03-01 DIAGNOSIS — R2689 Other abnormalities of gait and mobility: Secondary | ICD-10-CM

## 2022-03-01 DIAGNOSIS — M6281 Muscle weakness (generalized): Secondary | ICD-10-CM

## 2022-03-01 DIAGNOSIS — R278 Other lack of coordination: Secondary | ICD-10-CM | POA: Diagnosis not present

## 2022-03-01 DIAGNOSIS — R29818 Other symptoms and signs involving the nervous system: Secondary | ICD-10-CM

## 2022-03-01 DIAGNOSIS — R41844 Frontal lobe and executive function deficit: Secondary | ICD-10-CM | POA: Diagnosis not present

## 2022-03-01 NOTE — Therapy (Signed)
OUTPATIENT PHYSICAL THERAPY NEURO TREATMENT   Patient Name: Deanna Rowe MRN: [id number] DOB:[birth year], 66 y.o., female Today's Date: 03/01/2022   PCP: Aretta Nip, MD REFERRING PROVIDER: Ward Givens, NP    PT End of Session - 03/01/22 1015     Visit Number 13    Number of Visits 17    Date for PT Re-Evaluation 03/15/22   due to delay in scheduling/pt going on vacation   Authorization Type [employer]    PT Start Time 1016    PT Stop Time 1102    PT Time Calculation (min) 46 min    Equipment Utilized During Treatment Gait belt    Activity Tolerance Patient tolerated treatment well    Behavior During Therapy [facility] for tasks assessed/performed                     Past Medical History:  Diagnosis Date   Anemia    Anxiety    Arthritis    L HIP   Asthma    Avascular necrosis of hip ([place])    LEFT   Back pain    Chest pain    Constipation    Depression    Diabetes mellitus    Diabetes mellitus, type II (HCC)    Dry cough    Edema, lower extremity    Gait abnormality 11/18/2019   GERD (gastroesophageal reflux disease)    High cholesterol    Hypertension    Insomnia    takes Ambien nightly   Joint pain    Kidney disease    Kidney disease    Obesity    Parkinsonism 03/02/2017   PONV (postoperative nausea and vomiting)    RLS (restless legs syndrome) 01/15/2018   Sleep apnea    Swallowing difficulty    Thyroid disease    TIA (transient ischemic attack) 2012   no residual problems   Urgency incontinence    Vitamin D deficiency    Past Surgical History:  Procedure Laterality Date   DILATION AND CURETTAGE OF UTERUS     ESOPHAGEAL MANOMETRY N/A 09/03/2012   Procedure: ESOPHAGEAL MANOMETRY (EM);  Surgeon: Garlan Fair, MD;  Location: [facility];  Service: Endoscopy;  Laterality: N/A;   ESOPHAGEAL MANOMETRY N/A 06/19/2017   Procedure: ESOPHAGEAL MANOMETRY (EM);  Surgeon: Clarene Essex, MD;  Location: [facility];  Service:  Endoscopy;  Laterality: N/A;   EXPLORATORY LAPAROTOMY     GANGLION CYST EXCISION     JOINT REPLACEMENT  2011   rt total hip   KNEE ARTHROSCOPY     PILONIDAL CYST / SINUS EXCISION     TOTAL HIP ARTHROPLASTY Left 10/09/2013   Procedure: LEFT TOTAL HIP ARTHROPLASTY ANTERIOR APPROACH;  Surgeon: [name], MD;  Location: [facility];  Service: Orthopedics;  Laterality: Left;   Patient Active Problem List   Diagnosis Date Noted   Allergic conjunctivitis of both eyes 07/06/2020   Gait abnormality 11/18/2019   OSA on CPAP 06/04/2019   Pain in joint of left shoulder 03/28/2019   Seasonal allergic conjunctivitis 02/07/2019   Chronic intermittent hypoxia with obstructive sleep apnea 01/25/2019   Severe obstructive sleep apnea 01/25/2019   Excessive daytime sleepiness 01/03/2019   Non-restorative sleep 01/03/2019   Nocturia more than twice per night 01/03/2019   Weight gain, abnormal 01/03/2019   Snorings 01/03/2019   Trochanteric bursitis of right hip 04/27/2018   Encounter for routine screening for malformation using ultrasonics 02/08/2018   Abnormal weight gain 02/08/2018  Acid indigestion 02/08/2018   Acute antritis 02/08/2018   Class 2 severe obesity with serious comorbidity and body mass index (BMI) of 39.0 to 39.9 in adult ([place]) 02/08/2018   Perennial allergic rhinitis 02/08/2018   Antiphospholipid syndrome ([place]) 02/08/2018   Arthralgia of hip or thigh 02/08/2018   Asthma, cough variant 02/08/2018   Vitamin D deficiency 02/08/2018   Backhand tennis elbow 02/08/2018   Cannot sleep 02/08/2018   Chronic kidney disease (CKD), stage III (moderate) ([place]) 02/08/2018   Current drug use 02/08/2018   Depression, major, single episode, in partial remission ([place]) 02/08/2018   Diabetes mellitus with polyneuropathy ([place]) 02/08/2018   Difficulty hearing 02/08/2018   Disturbance of skin sensation 02/08/2018   Hypertension associated with diabetes ([place]) 02/08/2018   Extremity pain 02/08/2018    Facet syndrome, lumbar 02/08/2018   Factor V Leiden ([place]) 02/08/2018   Fungal infection of nail 02/08/2018   Gastroenteritis and colitis, viral 02/08/2018   Hypoglycemia 02/08/2018   Menopause 02/08/2018   Other long term (current) drug therapy 02/08/2018   Other osteonecrosis, unspecified femur ([place]) 02/08/2018   Pure hypercholesterolemia 02/08/2018   Recurrent major depressive episodes ([place]) 02/08/2018   Syncope and collapse 02/08/2018   Not well controlled moderate persistent asthma 02/08/2018   Cough, persistent 02/08/2018   Restless leg syndrome 01/15/2018   Degeneration of lumbar intervertebral disc 11/21/2017   Tremor of right hand 03/02/2017   Parkinson disease 03/02/2017   UTI (urinary tract infection) 12/01/2015   Type 2 diabetes mellitus with stage 3b chronic kidney disease, with long-term current use of insulin ([place]) 06/03/2015   Fast heart beat 04/09/2015   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 2 diabetes mellitus ([place]) 04/24/2014   Postoperative anemia due to acute blood loss 10/10/2013   Avascular necrosis of bone of left hip ([place]) 10/09/2013   Arthritis of pelvic region, degenerative 10/09/2013   Breath shortness 06/18/2013   Diabetes mellitus type 2, uncontrolled 05/31/2013   Intermittent claudication ([place]) 04/04/2013   Anxiety    Gastroesophageal reflux disease 09/12/2012   Clinical depression 02/21/2012   Stroke ([place]) 02/11/2012   History of revision of total replacement of right hip joint 04/04/2009    ONSET DATE: 12/02/2021   REFERRING DIAG: G20 (ICD-10-CM) - Parkinson disease ([place]) R29.6 (ICD-10-CM) - Falls frequently   THERAPY DIAG:  Muscle weakness (generalized)  Unsteadiness on feet  Other abnormalities of gait and mobility  Rationale for Evaluation and Treatment Rehabilitation  SUBJECTIVE:  SUBJECTIVE STATEMENT: Pt presents with her husband, Simona Huh. Pt reports 3 falls since last visit, 2 on steps and once when walking to laundry room. Did not have her walker. All falls in posterior direction. No major injuries other than bruising to her forearms. Not doing her HEP  Pt accompanied by: self  PERTINENT HISTORY: Parkinsonism (2018),TIA, thyroid disease, kidney disease, HTN, HLD, GERD, DMII, depression, L hip avascular necrosis, anxiety, anemia, R THA 2011, L THA 2015, knee arthroscopy   PAIN:  Are you having pain? No   PRECAUTIONS: Fall, No driving.   FALLS: Has patient fallen in last 6 months? Yes. Number of falls 10  VITALS There were no vitals filed for this visit.    PLOF: Independent with community mobility with device  PATIENT GOALS Wants to stop falling, improve balance.    OBJECTIVE:   COGNITION: Overall cognitive status: Impaired  TODAY'S TREATMENT:  Ther Act  Reviewed HEP w/pt and husband (did not review standing PWR moves) and pt had significant difficulty following cues. Pt responds well to use of visual cues but continues to require total A to properly perform. Pt's husband very hands-on and good with cueing pt. Pt w/10/10 R foot pain w/heel raises due to bone spur, so discontinued exercise until pt sees podiatrist. Pt had greatest difficulty w/quarter turns as she is unable to turn foot and takes a large step instead. Attempted quarter turns at both counter and w/rollator and pt unable to turn rollator w/feet. Recommended husband use tape on floor as a target for pt to turn foot towards and as a cue to keep her feet hip-width apart, as she assumes very narrow BOS  Discussed using rollator at all times at home. Husband reports pt has rollator on first floor and U-step on second floor, but pt does not use the U-step. Will need to practice at home   STAIRS:  Level of Assistance: CGA  Stair Negotiation Technique: Step to Pattern Alternating  Pattern  with Single Rail on Right  Number of Stairs: 8   Height of Stairs: 6"  Comments: Practiced ascending/descending steps w/single rail on R side showing husband how to guard. Pt initially assumed alternating pattern and both husband and therapist in agreement that pt is safer w/step-to pattern. Educated pt on staying on pt's L side due to no rail on that side and proper guarding position.    GAIT: Gait pattern:  genu valgus of RLE, step through pattern, decreased stride length, Right foot flat, Left foot flat, decreased trunk rotation, trunk flexed, and narrow BOS Distance walked: Clinic distances. Assistive device utilized: Environmental consultant - 4 wheeled Level of assistance: SBA Comments: Adjusted pt's rollator to ensure she is not pushing it away from her and cued her to stay inside walker at all times. Pt requires max cues for proper management of rollator when approaching obstacles, such as the mat and stairs as she tends to push rollator away from her and walk towards obstacle unsupported.    PATIENT EDUCATION: Education details: Education to husband on safety at home, reviewed HEP  Person educated: Patient Education method: Explanation Education comprehension: verbalized understanding and needs further education   HOME EXERCISE PROGRAM: Access Code: [email or web address] URL: [email or web address] Date: 12/22/2021 Prepared by: Mickie Bail Ein Rijo  Exercises - Sit to Stand Without Arm Support  - 1 x daily - 7 x weekly - 2 sets - 5-6 reps - Heel Raises with Counter Support  - 1 x daily - 7 x weekly - 3 sets - 10  reps - Step sideways with arms reaching at counter   - 1 x daily - 7 x weekly - 3 sets - 10 reps - Alternating Step Backward with Support  - 1 x daily - 7 x weekly - 3 sets - 10 reps - Standing Quarter Turn with Counter Support  - 1 x daily - 7 x weekly - 3 sets - 10 reps - Seated Hamstring Stretch  - 1 x daily - 7 x weekly - 1 sets - 3-4 reps - 30 second hold - Calf stretch  - 1  x daily - 7 x weekly - 1 sets - 3-4 reps - 30-45 second hold   Standing PWR moves    GOALS: Goals reviewed with patient? Yes  SHORT TERM GOALS: Target date: 01/12/2022  Pt will be independent with initial HEP in order to build upon functional gains made in therapy.  Baseline: Goal status: MET  2.  Pt will improve MiniBest to 19/28 for decreased fall risk and improvement with compensatory stepping strategies.   Baseline: 16/28; 19/28  Goal status: MET  3.  Pt will improve 5x sit<>stand to less than or equal to 24 sec with no UE support to demonstrate improved functional strength and transfer efficiency.  Baseline: 28.59 seconds with no UE support; 13.21s without UE support Goal status: MET  4.  Pt will improve gait speed with LRAD to at least 2.3 ft/sec in order to demo improved community mobility.  Baseline: 16.58 seconds = 1.95 ft/sec with SPC; 2.61f/s w/SPC Goal status: MET  5.  Pt will improve manual TUG to 14 seconds or less in order to decr fall risk. Baseline: 16 seconds with SPC; 21.93s w/SPC Goal status: NOT MET   LONG TERM GOALS: Target date: 03/15/22 (UPDATED DATE)  Pt will be independent with final HEP for PD specific deficits/balance in order to build upon functional gains made in therapy. Baseline:  Goal status: IN PROGRESS  2.  Pt will improve MiniBest to 22/28 for decreased fall risk and improvement with compensatory stepping strategies.   Baseline: 16/28; 18/28 on 02/10/22 Goal status: REVISED  3.  Pt will improve gait speed with LRAD to at least 2.6 ft/sec in order to demo improved community mobility.  Baseline: 16.58 seconds = 1.95 ft/sec with SPC; 2.5 ft/s w/ SPC on 10/10   14.2 seconds with SPC = 2.31 ft/sec Goal status: REVISED  4.  Pt will improve 5x sit<>stand to less than or equal to 14 sec with no UE support to demonstrate improved functional strength and transfer efficiency.  Baseline: 28.59 seconds with no UE support; 13.21s on 10/10,    16 seconds on 02/10/22 Goal status: REVISED  5.  Pt will verbalize understanding of local Parkinson's disease resources, including options for continued community fitness. Baseline:  Goal status: IN PROGRESS   ASSESSMENT:  CLINICAL IMPRESSION: Emphasis of skilled PT session on pt and family education regarding HEP and safety at home. Pt has had 3 falls since last visit, twice on steps and once while tripping over obstacle on floor. Reviewed HEP w/husband, as pt has not been performing, and pt had significant difficulty sequencing quarter turns both with and without walker. Recommended pt use walker at all times at home (she has one on each level of her home) and practice turns sequencing for improved AD management. Pt requires max cues for safe use of walker in clinic, as she frequently walks away from it or pushes it too far from her. Pt will  benefit from continued practice w/rollator and U-step (she has one at home) and foot placement on stairs. Continue POC.    OBJECTIVE IMPAIRMENTS Abnormal gait, decreased activity tolerance, decreased balance, decreased coordination, difficulty walking, decreased strength, impaired flexibility, impaired tone, and postural dysfunction.   ACTIVITY LIMITATIONS bending, squatting, stairs, transfers, and locomotion level  PARTICIPATION LIMITATIONS: driving and community activity  PERSONAL FACTORS Behavior pattern, Past/current experiences, Time since onset of injury/illness/exacerbation, and 3+ comorbidities: Parkinsonism (2018),TIA, thyroid disease, kidney disease, HTN, HLD, GERD, DMII, depression, L hip avascular necrosis, anxiety, anemia, R THA 2011, L THA 2015, knee arthroscopy   are also affecting patient's functional outcome.   REHAB POTENTIAL: Good  CLINICAL DECISION MAKING: Evolving/moderate complexity  EVALUATION COMPLEXITY: Moderate  PLAN: PT FREQUENCY: 2x/week  PT DURATION: 12 weeks  PLANNED INTERVENTIONS: Therapeutic exercises,  Therapeutic activity, Neuromuscular re-education, Balance training, Gait training, Patient/Family education, Self Care, Stair training, Vestibular training, DME instructions, Manual therapy, and Re-evaluation  PLAN FOR NEXT SESSION: Review HEP and make sure pt is doing correctly.  Work on stepping strategies (posterior), wider BOS and staggered stance w/reaching tasks. functional strength. Sit <>stands on wedge, scifit for endurance,forward lunge over beam, reaching out of BOS, resisted walking, sit <>stands w/slam ball. Review standing PWR moves as needed. Dual tasking   Cruzita Lederer Laurita Peron, PT, DPT  03/01/2022, 11:03 AM

## 2022-03-01 NOTE — Therapy (Signed)
OUTPATIENT OCCUPATIONAL THERAPY [facility]   Patient Name: Deanna Rowe MRN: [id number] DOB:April 19, 1955, 66 y.o., female Today's Date: 03/01/2022  PCP: Dr. Radene Ou REFERRING PROVIDER: Ward Givens NP   OT End of Session - 03/01/22 1158     Visit Number 8    Number of Visits 25    Date for OT Re-Evaluation 03/17/22    Authorization Type [facility]    Authorization - Visit Number 8    Authorization - Number of Visits 12    OT Start Time [phone]    OT Stop Time 1014    OT Time Calculation (min) 38 min                Past Medical History:  Diagnosis Date   Anemia    Anxiety    Arthritis    L HIP   Asthma    Avascular necrosis of hip ([place])    LEFT   Back pain    Chest pain    Constipation    Depression    Diabetes mellitus    Diabetes mellitus, type II (HCC)    Dry cough    Edema, lower extremity    Gait abnormality 11/18/2019   GERD (gastroesophageal reflux disease)    High cholesterol    Hypertension    Insomnia    takes Ambien nightly   Joint pain    Kidney disease    Kidney disease    Obesity    Parkinsonism 03/02/2017   PONV (postoperative nausea and vomiting)    RLS (restless legs syndrome) 01/15/2018   Sleep apnea    Swallowing difficulty    Thyroid disease    TIA (transient ischemic attack) 2012   no residual problems   Urgency incontinence    Vitamin D deficiency    Past Surgical History:  Procedure Laterality Date   DILATION AND CURETTAGE OF UTERUS     ESOPHAGEAL MANOMETRY N/A 09/03/2012   Procedure: ESOPHAGEAL MANOMETRY (EM);  Surgeon: Garlan Fair, MD;  Location: [facility];  Service: Endoscopy;  Laterality: N/A;   ESOPHAGEAL MANOMETRY N/A 06/19/2017   Procedure: ESOPHAGEAL MANOMETRY (EM);  Surgeon: Clarene Essex, MD;  Location: [facility];  Service: Endoscopy;  Laterality: N/A;   EXPLORATORY LAPAROTOMY     GANGLION CYST EXCISION     JOINT REPLACEMENT  2011   rt total hip   KNEE ARTHROSCOPY     PILONIDAL CYST /  SINUS EXCISION     TOTAL HIP ARTHROPLASTY Left 10/09/2013   Procedure: LEFT TOTAL HIP ARTHROPLASTY ANTERIOR APPROACH;  Surgeon: [name], MD;  Location: [facility];  Service: Orthopedics;  Laterality: Left;   Patient Active Problem List   Diagnosis Date Noted   Allergic conjunctivitis of both eyes 07/06/2020   Gait abnormality 11/18/2019   OSA on CPAP 06/04/2019   Pain in joint of left shoulder 03/28/2019   Seasonal allergic conjunctivitis 02/07/2019   Chronic intermittent hypoxia with obstructive sleep apnea 01/25/2019   Severe obstructive sleep apnea 01/25/2019   Excessive daytime sleepiness 01/03/2019   Non-restorative sleep 01/03/2019   Nocturia more than twice per night 01/03/2019   Weight gain, abnormal 01/03/2019   [place]ings 01/03/2019   Trochanteric bursitis of right hip 04/27/2018   Encounter for routine screening for malformation using ultrasonics 02/08/2018   Abnormal weight gain 02/08/2018   Acid indigestion 02/08/2018   Acute antritis 02/08/2018   Class 2 severe obesity with serious comorbidity and body mass index (BMI) of 39.0 to 39.9 in adult (  [place]) 02/08/2018   Perennial allergic rhinitis 02/08/2018   Antiphospholipid syndrome ([place]) 02/08/2018   Arthralgia of hip or thigh 02/08/2018   Asthma, cough variant 02/08/2018   Vitamin D deficiency 02/08/2018   Backhand tennis elbow 02/08/2018   Cannot sleep 02/08/2018   Chronic kidney disease (CKD), stage III (moderate) ([place]) 02/08/2018   Current drug use 02/08/2018   Depression, major, single episode, in partial remission ([place]) 02/08/2018   Diabetes mellitus with polyneuropathy ([place]) 02/08/2018   Difficulty hearing 02/08/2018   Disturbance of skin sensation 02/08/2018   Hypertension associated with diabetes ([place]) 02/08/2018   Extremity pain 02/08/2018   Facet syndrome, lumbar 02/08/2018   Factor V Leiden ([place]) 02/08/2018   Fungal infection of nail 02/08/2018   Gastroenteritis and colitis, viral 02/08/2018    Hypoglycemia 02/08/2018   Menopause 02/08/2018   Other long term (current) drug therapy 02/08/2018   Other osteonecrosis, unspecified femur ([place]) 02/08/2018   Pure hypercholesterolemia 02/08/2018   Recurrent major depressive episodes ([place]) 02/08/2018   Syncope and collapse 02/08/2018   Not well controlled moderate persistent asthma 02/08/2018   Cough, persistent 02/08/2018   Restless leg syndrome 01/15/2018   Degeneration of lumbar intervertebral disc 11/21/2017   Tremor of right hand 03/02/2017   Parkinson disease 03/02/2017   UTI (urinary tract infection) 12/01/2015   Type 2 diabetes mellitus with stage 3b chronic kidney disease, with long-term current use of insulin ([place]) 06/03/2015   Fast heart beat 04/09/2015   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 2 diabetes mellitus ([place]) 04/24/2014   Postoperative anemia due to acute blood loss 10/10/2013   Avascular necrosis of bone of left hip ([place]) 10/09/2013   Arthritis of pelvic region, degenerative 10/09/2013   Breath shortness 06/18/2013   Diabetes mellitus type 2, uncontrolled 05/31/2013   Intermittent claudication ([place]) 04/04/2013   Anxiety    Gastroesophageal reflux disease 09/12/2012   Clinical depression 02/21/2012   Stroke ([place]) 02/11/2012   History of revision of total replacement of right hip joint 04/04/2009    ONSET DATE: 12/15/21  REFERRING DIAG: Parkinsonism  THERAPY DIAG:  Muscle weakness (generalized)  Other lack of coordination  Other symptoms and signs involving the nervous system  Attention and concentration deficit  Frontal lobe and executive function deficit  Unsteadiness on feet  Abnormal posture  Other abnormalities of gait and mobility  Rationale for Evaluation and Treatment Rehabilitation  SUBJECTIVE:   SUBJECTIVE STATEMENT: Pt reports no falls since last visit Pt accompanied by: self, husband  PERTINENT HISTORY: PERTINENT HISTORY: Parkinsonism (2018),TIA, thyroid disease, kidney disease, HTN, HLD,  GERD, DMII, depression, L hip avascular necrosis, anxiety, anemia, R THA 2011, L THA 2015, knee arthroscopy   PRECAUTIONS: Fall  WEIGHT BEARING RESTRICTIONS No  PAIN:  Are you having pain? No (only soreness from recent fall)   FALLS: Has patient fallen in last 6 months? Yes. Number of falls 5  LIVING ENVIRONMENT: Lives with: lives with their spouse Lives in: House/apartment  PLOF: Independent  PATIENT GOALS to be able style hair  OBJECTIVE: from eval   HAND DOMINANCE: Right   FUNCTIONAL OUTCOME MEASURES: Fastening/unfastening 3 buttons: 50.68 Physical performance test: PPT#2 (simulated eating) 15.03 & PPT#4 (donning/doffing jacket): 13.34  COORDINATION: 9 Hole Peg test: Right: 36.91 sec; Left: 38.16 sec Box and Blocks:  Right 33blocks, Left 37blocks   TODAY'S TREATMENT:   Pt's husband was present today. Pt reports 3 falls since last visit. Pt was not using her rollator for 1, she sat down on a low surface and  fell getting up and  then fell when climbing stairs. Pt was cautioned to use her rollator at home except for stair climbing. Therapist discussed safety to minimize falls risk.  PT was made aware of these falls PWR! Moves supine basic 4 - pt demo each x 10 w/ min-mod cues for large amplitude movements. PWR! Step and hip lifts broken down. Cues to look at hands and for performance. Increased time required. Pt's husband reports he can assist pt. with task. Pt was shown how to utilize PWR! Supine to get out of bed and for bed mobility. Pt was again  encouraged to call her psychologist/ psychiatrist due to feeling down.  Safety for sit to stand reviewed with pt/ husband, pt returned demonstration.          PATIENT EDUCATION: Education details: PWR! Basic 4 supine reviewed Person educated: Patient Education method: explanation, demonstration Education comprehension: verbalized understanding, returned demonstration, and verbal cues required    HOME EXERCISE  PROGRAM: 12/27/21: PWR! Seated, coordination HEP, writing strategies 12/30/21: PWR! Hands  02/03/22- PWR! Supine basic 4 02/15/22- recommendation for staggered feet and use of rollator for laundry, education regarding pt visual deficits, inability to track inferiorly.   ASSESSMENT:  CLINICAL IMPRESSION: Pt is progressing towards goals. She continues to be limited by visual and cognitive deficits. Pt has 3 falls since last visist. PERFORMANCE DEFICITS in functional skills including ADLs, IADLs, coordination, dexterity, proprioception, sensation, tone, ROM, strength, pain, flexibility, FMC, GMC, mobility, balance, endurance, decreased knowledge of precautions, decreased knowledge of use of DME, and UE functional use, bradykinesia , cognitive skills including attention, energy/drive, learn, memory, problem solving, safety awareness, thought, and understand, and psychosocial skills including coping strategies, environmental adaptation, habits, interpersonal interactions, and routines and behaviors.   IMPAIRMENTS are limiting patient from ADLs, IADLs, play, leisure, and social participation.   COMORBIDITIES may have co-morbidities  that affects occupational performance. Patient will benefit from skilled OT to address above impairments and improve overall function.  MODIFICATION OR ASSISTANCE TO COMPLETE EVALUATION: No modification of tasks or assist necessary to complete an evaluation.  OT OCCUPATIONAL PROFILE AND HISTORY: Detailed assessment: Review of records and additional review of physical, cognitive, psychosocial history related to current functional performance.  CLINICAL DECISION MAKING: LOW - limited treatment options, no task modification necessary  REHAB POTENTIAL: Good  EVALUATION COMPLEXITY: Low     GOALS: Goals reviewed with patient? No  SHORT TERM GOALS: Target date: 02/21/22- extended due to scheduling  I with PD specific HEP Baseline: Goal status: met  2.  Pt will  verbalize understanding of adapted strategies to maximize safety and I with ADLs/ IADLs .  Baseline:  Goal status: ongoing  3.  Pt will demonstrate improved bilateral UE functional use as evidenced by increasing box/ blocks score by 3 blocks Baseline: RUE 33, LUE 37 Goal status: INITIAL  4.  Pt will demonstrate improved ease with feeding as evidenced by decreasing PPT#2 (self feeding) by 3 secs Baseline: 15.03 Goal status: ongoing  5.  Pt will report increased ease with styling her hair Baseline: Pt reports difficulty Goal status: ongoing    LONG TERM GOALS: Target date: 03/17/22-  1.Pt will verbalize understanding of ways to prevent future PD related complications and PD community resources.  Baseline:  Goal status: INITIAL  2.  Pt will demonstrate improved fine motor coordination for ADLs as evidenced by decreasing bilateral  9 hole peg test score for by 3 secs  Baseline: RUE 36.91, LUE 38.16 Goal status: INITIAL  3.  Pt will demonstrate understanding of memory compensations and ways to keep thinking skills sharp  Baseline:  Goal status: INITIAL  4.  Pt will demonstrate improved ease with fastening buttons as evidenced by decreasing 3 button/ unbutton time to 47 secs or less  Baseline: 50.68 Goal status: INITIAL   PLAN: OT FREQUENCY: 2x/week  OT DURATION: 12 weeks POC written for 12 weeks to account for scheduling  PLANNED INTERVENTIONS: self care/ADL training, therapeutic exercise, therapeutic activity, neuromuscular re-education, manual therapy, passive range of motion, balance training, stair training, functional mobility training, aquatic therapy, ultrasound, paraffin, fluidotherapy, moist heat, cryotherapy, contrast bath, patient/family education, cognitive remediation/compensation, visual/perceptual remediation/compensation, energy conservation, coping strategies training, DME and/or AE instructions, and Re-evaluation  RECOMMENDED OTHER SERVICES:  PT  CONSULTED AND AGREED WITH PLAN OF CARE: Patient  PLAN FOR NEXT SESSION: check short term goals,continue ADL strategies,  compensation for visual deficits, stepping strategies, functional reaching w/ balance,  ,, OT 03/01/2022, 12:00 PM

## 2022-03-02 ENCOUNTER — Other Ambulatory Visit (HOSPITAL_BASED_OUTPATIENT_CLINIC_OR_DEPARTMENT_OTHER): Payer: Self-pay

## 2022-03-02 ENCOUNTER — Other Ambulatory Visit (HOSPITAL_COMMUNITY): Payer: Self-pay

## 2022-03-02 MED ORDER — GABAPENTIN 300 MG PO CAPS
300.0000 mg | ORAL_CAPSULE | Freq: Three times a day (TID) | ORAL | 2 refills | Status: DC
  Filled 2022-03-02 – 2022-03-03 (×2): qty 270, 90d supply, fill #0
  Filled 2022-05-25: qty 270, 90d supply, fill #1
  Filled 2022-08-23: qty 270, 90d supply, fill #2

## 2022-03-03 ENCOUNTER — Ambulatory Visit: Payer: 59 | Admitting: Occupational Therapy

## 2022-03-03 ENCOUNTER — Other Ambulatory Visit (HOSPITAL_BASED_OUTPATIENT_CLINIC_OR_DEPARTMENT_OTHER): Payer: Self-pay

## 2022-03-03 ENCOUNTER — Ambulatory Visit: Payer: 59 | Admitting: Physical Therapy

## 2022-03-03 DIAGNOSIS — R29818 Other symptoms and signs involving the nervous system: Secondary | ICD-10-CM

## 2022-03-03 DIAGNOSIS — R41840 Attention and concentration deficit: Secondary | ICD-10-CM | POA: Diagnosis not present

## 2022-03-03 DIAGNOSIS — R41844 Frontal lobe and executive function deficit: Secondary | ICD-10-CM | POA: Diagnosis not present

## 2022-03-03 DIAGNOSIS — R278 Other lack of coordination: Secondary | ICD-10-CM | POA: Diagnosis not present

## 2022-03-03 DIAGNOSIS — R2681 Unsteadiness on feet: Secondary | ICD-10-CM

## 2022-03-03 DIAGNOSIS — R293 Abnormal posture: Secondary | ICD-10-CM | POA: Diagnosis not present

## 2022-03-03 DIAGNOSIS — M6281 Muscle weakness (generalized): Secondary | ICD-10-CM

## 2022-03-03 DIAGNOSIS — R2689 Other abnormalities of gait and mobility: Secondary | ICD-10-CM | POA: Diagnosis not present

## 2022-03-03 NOTE — Therapy (Signed)
OUTPATIENT PHYSICAL THERAPY NEURO TREATMENT   Patient Name: KASIDI SHANKER MRN: [id number] DOB:Feb 20, 1956, 66 y.o., female Today's Date: 03/03/2022   PCP: Aretta Nip, MD REFERRING PROVIDER: Ward Givens, NP    PT End of Session - 03/03/22 1018     Visit Number 14    Number of Visits 17    Date for PT Re-Evaluation 03/15/22   due to delay in scheduling/pt going on vacation   Authorization Type [employer]    PT Start Time 1016    PT Stop Time 1100    PT Time Calculation (min) 44 min    Equipment Utilized During Treatment Gait belt    Activity Tolerance Patient tolerated treatment well    Behavior During Therapy [facility] for tasks assessed/performed                      Past Medical History:  Diagnosis Date   Anemia    Anxiety    Arthritis    L HIP   Asthma    Avascular necrosis of hip ([place])    LEFT   Back pain    Chest pain    Constipation    Depression    Diabetes mellitus    Diabetes mellitus, type II ([place])    Dry cough    Edema, lower extremity    Gait abnormality 11/18/2019   GERD (gastroesophageal reflux disease)    High cholesterol    Hypertension    Insomnia    takes Ambien nightly   Joint pain    Kidney disease    Kidney disease    Obesity    Parkinsonism 03/02/2017   PONV (postoperative nausea and vomiting)    RLS (restless legs syndrome) 01/15/2018   Sleep apnea    Swallowing difficulty    Thyroid disease    TIA (transient ischemic attack) 2012   no residual problems   Urgency incontinence    Vitamin D deficiency    Past Surgical History:  Procedure Laterality Date   DILATION AND CURETTAGE OF UTERUS     ESOPHAGEAL MANOMETRY N/A 09/03/2012   Procedure: ESOPHAGEAL MANOMETRY (EM);  Surgeon: Garlan Fair, MD;  Location: [facility];  Service: Endoscopy;  Laterality: N/A;   ESOPHAGEAL MANOMETRY N/A 06/19/2017   Procedure: ESOPHAGEAL MANOMETRY (EM);  Surgeon: Clarene Essex, MD;  Location: [facility];  Service:  Endoscopy;  Laterality: N/A;   EXPLORATORY LAPAROTOMY     GANGLION CYST EXCISION     JOINT REPLACEMENT  2011   rt total hip   KNEE ARTHROSCOPY     PILONIDAL CYST / SINUS EXCISION     TOTAL HIP ARTHROPLASTY Left 10/09/2013   Procedure: LEFT TOTAL HIP ARTHROPLASTY ANTERIOR APPROACH;  Surgeon: [name], MD;  Location: [facility];  Service: Orthopedics;  Laterality: Left;   Patient Active Problem List   Diagnosis Date Noted   Allergic conjunctivitis of both eyes 07/06/2020   Gait abnormality 11/18/2019   OSA on CPAP 06/04/2019   Pain in joint of left shoulder 03/28/2019   Seasonal allergic conjunctivitis 02/07/2019   Chronic intermittent hypoxia with obstructive sleep apnea 01/25/2019   Severe obstructive sleep apnea 01/25/2019   Excessive daytime sleepiness 01/03/2019   Non-restorative sleep 01/03/2019   Nocturia more than twice per night 01/03/2019   Weight gain, abnormal 01/03/2019   Snorings 01/03/2019   Trochanteric bursitis of right hip 04/27/2018   Encounter for routine screening for malformation using ultrasonics 02/08/2018   Abnormal weight gain  02/08/2018   Acid indigestion 02/08/2018   Acute antritis 02/08/2018   Class 2 severe obesity with serious comorbidity and body mass index (BMI) of 39.0 to 39.9 in adult [facility]) 02/08/2018   Perennial allergic rhinitis 02/08/2018   Antiphospholipid syndrome ([place]) 02/08/2018   Arthralgia of hip or thigh 02/08/2018   Asthma, cough variant 02/08/2018   Vitamin D deficiency 02/08/2018   Backhand tennis elbow 02/08/2018   Cannot sleep 02/08/2018   Chronic kidney disease (CKD), stage III (moderate) ([place]) 02/08/2018   Current drug use 02/08/2018   Depression, major, single episode, in partial remission ([place]) 02/08/2018   Diabetes mellitus with polyneuropathy ([place]) 02/08/2018   Difficulty hearing 02/08/2018   Disturbance of skin sensation 02/08/2018   Hypertension associated with diabetes ([place]) 02/08/2018   Extremity pain 02/08/2018    Facet syndrome, lumbar 02/08/2018   Factor V Leiden ([place]) 02/08/2018   Fungal infection of nail 02/08/2018   Gastroenteritis and colitis, viral 02/08/2018   Hypoglycemia 02/08/2018   Menopause 02/08/2018   Other long term (current) drug therapy 02/08/2018   Other osteonecrosis, unspecified femur ([place]) 02/08/2018   Pure hypercholesterolemia 02/08/2018   Recurrent major depressive episodes ([place]) 02/08/2018   Syncope and collapse 02/08/2018   Not well controlled moderate persistent asthma 02/08/2018   Cough, persistent 02/08/2018   Restless leg syndrome 01/15/2018   Degeneration of lumbar intervertebral disc 11/21/2017   Tremor of right hand 03/02/2017   Parkinson disease 03/02/2017   UTI (urinary tract infection) 12/01/2015   Type 2 diabetes mellitus with stage 3b chronic kidney disease, with long-term current use of insulin ([place]) 06/03/2015   Fast heart beat 04/09/2015   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 2 diabetes mellitus ([place]) 04/24/2014   Postoperative anemia due to acute blood loss 10/10/2013   Avascular necrosis of bone of left hip ([place]) 10/09/2013   Arthritis of pelvic region, degenerative 10/09/2013   Breath shortness 06/18/2013   Diabetes mellitus type 2, uncontrolled 05/31/2013   Intermittent claudication ([place]) 04/04/2013   Anxiety    Gastroesophageal reflux disease 09/12/2012   Clinical depression 02/21/2012   Stroke ([place]) 02/11/2012   History of revision of total replacement of right hip joint 04/04/2009    ONSET DATE: 12/02/2021   REFERRING DIAG: G20 (ICD-10-CM) - Parkinson disease ([place]) R29.6 (ICD-10-CM) - Falls frequently   THERAPY DIAG:  Unsteadiness on feet  Muscle weakness (generalized)  Other abnormalities of gait and mobility  Rationale for Evaluation and Treatment Rehabilitation  SUBJECTIVE:  SUBJECTIVE STATEMENT: Pt presents with her husband, Simona Huh. Pt denies any changes since last visit, exercises are going well. Continues to have difficulty getting out of bed. R foot is still bothering her "but I would not count it as pain"   Pt accompanied by: self  PERTINENT HISTORY: Parkinsonism (2018),TIA, thyroid disease, kidney disease, HTN, HLD, GERD, DMII, depression, L hip avascular necrosis, anxiety, anemia, R THA 2011, L THA 2015, knee arthroscopy   PAIN:  Are you having pain? No   PRECAUTIONS: Fall, No driving.   FALLS: Has patient fallen in last 6 months? Yes. Number of falls 10  VITALS There were no vitals filed for this visit.    PLOF: Independent with community mobility with device  PATIENT GOALS Wants to stop falling, improve balance.    OBJECTIVE:   COGNITION: Overall cognitive status: Impaired  TODAY'S TREATMENT:  Ther Act  Reviewed bed mobility techniques for performing supine <>sit, as pt continues to have difficulty with this at home. Had pt demonstrate how she performs at home and noted pt bringing top leg to her chest and facilitating roll to prone rather than to sit. Educated pt on getting in side-lying position on L side and dropping her feet off of the bed and pushing up to sit w/her LUE, which pt able to demonstrate x3 without assistance.   Ther Ex  SciFit multi-peaks level 7 for 8 minutes using BUE/BLEs for neural priming for reciprocal movement, dynamic cardiovascular warmup and increased amplitude of stepping. RPE of 5/10 following activity     NMR  In // bars, alt retro stepping to colored dot target w/BUE support for improved posterior stepping strategies, balance w/staggered stance and LE coordination. Pt initially taking very small steps that did not reach dot target. Min cues to use mirror to look at dots behind her to increase step length, which pt able to do well. No instability noted.  Blaze pods on random reach setting,  3x1 minute, for improved LE coordination, quarter turns and visual scanning. Pt hit 9 on round 1, 16 on round 2 and then 18 on round 3. CGA throughout for stability, but no LOB noted. Added 4" step under 2 pods and 2" step under one pod for final round and pt hit 20 pods w/CGA. Pt reported significant difficulty w/task but performed well.   GAIT: Gait pattern:  genu valgus of RLE, step through pattern, decreased stride length, Right foot flat, Left foot flat, decreased trunk rotation, trunk flexed, and narrow BOS Distance walked: Clinic distances. Assistive device utilized: Environmental consultant - 4 wheeled Level of assistance: SBA Comments: Adjusted pt's rollator to ensure she is not pushing it away from her and cued her to stay inside walker at all times. Pt requires max cues for proper management of rollator when approaching obstacles, such as the mat and stairs as she tends to push rollator away from her and walk towards obstacle unsupported.    PATIENT EDUCATION: Education details: Proper bed mobility techniques, continue HEP Person educated: Patient Education method: Explanation Education comprehension: verbalized understanding and needs further education   HOME EXERCISE PROGRAM: Access Code: [email or web address] URL: [email or web address] Date: 12/22/2021 Prepared by: Mickie Bail Juwon Scripter  Exercises - Sit to Stand Without Arm Support  - 1 x daily - 7 x weekly - 2 sets - 5-6 reps - Heel Raises with Counter Support  - 1 x daily - 7 x weekly - 3 sets - 10 reps - Step sideways with arms reaching at counter   - 1  x daily - 7 x weekly - 3 sets - 10 reps - Alternating Step Backward with Support  - 1 x daily - 7 x weekly - 3 sets - 10 reps - Standing Quarter Turn with Counter Support  - 1 x daily - 7 x weekly - 3 sets - 10 reps - Seated Hamstring Stretch  - 1 x daily - 7 x weekly - 1 sets - 3-4 reps - 30 second hold - Calf stretch  - 1 x daily - 7 x weekly - 1 sets - 3-4 reps - 30-45 second  hold   Standing PWR moves    GOALS: Goals reviewed with patient? Yes  SHORT TERM GOALS: Target date: 01/12/2022  Pt will be independent with initial HEP in order to build upon functional gains made in therapy.  Baseline: Goal status: MET  2.  Pt will improve MiniBest to 19/28 for decreased fall risk and improvement with compensatory stepping strategies.   Baseline: 16/28; 19/28  Goal status: MET  3.  Pt will improve 5x sit<>stand to less than or equal to 24 sec with no UE support to demonstrate improved functional strength and transfer efficiency.  Baseline: 28.59 seconds with no UE support; 13.21s without UE support Goal status: MET  4.  Pt will improve gait speed with LRAD to at least 2.3 ft/sec in order to demo improved community mobility.  Baseline: 16.58 seconds = 1.95 ft/sec with SPC; 2.33f/s w/SPC Goal status: MET  5.  Pt will improve manual TUG to 14 seconds or less in order to decr fall risk. Baseline: 16 seconds with SPC; 21.93s w/SPC Goal status: NOT MET   LONG TERM GOALS: Target date: 03/15/22 (UPDATED DATE)  Pt will be independent with final HEP for PD specific deficits/balance in order to build upon functional gains made in therapy. Baseline:  Goal status: IN PROGRESS  2.  Pt will improve MiniBest to 22/28 for decreased fall risk and improvement with compensatory stepping strategies.   Baseline: 16/28; 18/28 on 02/10/22 Goal status: REVISED  3.  Pt will improve gait speed with LRAD to at least 2.6 ft/sec in order to demo improved community mobility.  Baseline: 16.58 seconds = 1.95 ft/sec with SPC; 2.5 ft/s w/ SPC on 10/10   14.2 seconds with SPC = 2.31 ft/sec Goal status: REVISED  4.  Pt will improve 5x sit<>stand to less than or equal to 14 sec with no UE support to demonstrate improved functional strength and transfer efficiency.  Baseline: 28.59 seconds with no UE support; 13.21s on 10/10,   16 seconds on 02/10/22 Goal status: REVISED  5.  Pt  will verbalize understanding of local Parkinson's disease resources, including options for continued community fitness. Baseline:  Goal status: IN PROGRESS   ASSESSMENT:  CLINICAL IMPRESSION: Emphasis of skilled PT session on bed mobility, LE coordination and stepping strategies. Pt continues to have difficulty w/supine <>sit due to improper BLE placement. W/review, pt able to perform well. Pt continues to be challenged by proper AD management, posterior stepping and turns but improves w/use of visual cues. Continue POC.   OBJECTIVE IMPAIRMENTS Abnormal gait, decreased activity tolerance, decreased balance, decreased coordination, difficulty walking, decreased strength, impaired flexibility, impaired tone, and postural dysfunction.   ACTIVITY LIMITATIONS bending, squatting, stairs, transfers, and locomotion level  PARTICIPATION LIMITATIONS: driving and community activity  PERSONAL FACTORS Behavior pattern, Past/current experiences, Time since onset of injury/illness/exacerbation, and 3+ comorbidities: Parkinsonism (2018),TIA, thyroid disease, kidney disease, HTN, HLD, GERD, DMII, depression, L hip avascular  necrosis, anxiety, anemia, R THA 2011, L THA 2015, knee arthroscopy   are also affecting patient's functional outcome.   REHAB POTENTIAL: Good  CLINICAL DECISION MAKING: Evolving/moderate complexity  EVALUATION COMPLEXITY: Moderate  PLAN: PT FREQUENCY: 2x/week  PT DURATION: 12 weeks  PLANNED INTERVENTIONS: Therapeutic exercises, Therapeutic activity, Neuromuscular re-education, Balance training, Gait training, Patient/Family education, Self Care, Stair training, Vestibular training, DME instructions, Manual therapy, and Re-evaluation  PLAN FOR NEXT SESSION: Stepping strategies (posterior), wider BOS and staggered stance w/reaching tasks. functional strength. Sit <>stands on wedge, scifit for endurance,forward lunge over beam, reaching out of BOS, resisted walking, sit <>stands  w/slam ball. Review standing PWR moves as needed. Dual tasking   Cruzita Lederer Nisreen Guise, PT, DPT  03/03/2022, 11:12 AM

## 2022-03-03 NOTE — Therapy (Signed)
OUTPATIENT OCCUPATIONAL THERAPY [facility]   Patient Name: Deanna Rowe MRN: [id number] DOB:22-May-1955, 66 y.o., female Today's Date: 03/01/2022  PCP: Dr. Radene Ou REFERRING PROVIDER: Ward Givens NP   OT End of Session - 03/01/22 1158     Visit Number 8    Number of Visits 25    Date for OT Re-Evaluation 03/17/22    Authorization Type [facility]    Authorization - Visit Number 8    Authorization - Number of Visits 12    OT Start Time [phone]    OT Stop Time 1014    OT Time Calculation (min) 38 min                Past Medical History:  Diagnosis Date   Anemia    Anxiety    Arthritis    L HIP   Asthma    Avascular necrosis of hip ([place])    LEFT   Back pain    Chest pain    Constipation    Depression    Diabetes mellitus    Diabetes mellitus, type II (HCC)    Dry cough    Edema, lower extremity    Gait abnormality 11/18/2019   GERD (gastroesophageal reflux disease)    High cholesterol    Hypertension    Insomnia    takes Ambien nightly   Joint pain    Kidney disease    Kidney disease    Obesity    Parkinsonism 03/02/2017   PONV (postoperative nausea and vomiting)    RLS (restless legs syndrome) 01/15/2018   Sleep apnea    Swallowing difficulty    Thyroid disease    TIA (transient ischemic attack) 2012   no residual problems   Urgency incontinence    Vitamin D deficiency    Past Surgical History:  Procedure Laterality Date   DILATION AND CURETTAGE OF UTERUS     ESOPHAGEAL MANOMETRY N/A 09/03/2012   Procedure: ESOPHAGEAL MANOMETRY (EM);  Surgeon: Garlan Fair, MD;  [facility]: [facility];  Service: Endoscopy;  Laterality: N/A;   ESOPHAGEAL MANOMETRY N/A 06/19/2017   Procedure: ESOPHAGEAL MANOMETRY (EM);  Surgeon: Clarene Essex, MD;  Location: [facility];  Service: Endoscopy;  Laterality: N/A;   EXPLORATORY LAPAROTOMY     GANGLION CYST EXCISION     JOINT REPLACEMENT  2011   rt total hip   KNEE ARTHROSCOPY     PILONIDAL CYST /  SINUS EXCISION     TOTAL HIP ARTHROPLASTY Left 10/09/2013   Procedure: LEFT TOTAL HIP ARTHROPLASTY ANTERIOR APPROACH;  Surgeon: [name], MD;  Location: [facility];  Service: Orthopedics;  Laterality: Left;   Patient Active Problem List   Diagnosis Date Noted   Allergic conjunctivitis of both eyes 07/06/2020   Gait abnormality 11/18/2019   OSA on CPAP 06/04/2019   Pain in joint of left shoulder 03/28/2019   Seasonal allergic conjunctivitis 02/07/2019   Chronic intermittent hypoxia with obstructive sleep apnea 01/25/2019   Severe obstructive sleep apnea 01/25/2019   Excessive daytime sleepiness 01/03/2019   Non-restorative sleep 01/03/2019   Nocturia more than twice per night 01/03/2019   Weight gain, abnormal 01/03/2019   Snorings 01/03/2019   Trochanteric bursitis of right hip 04/27/2018   Encounter for routine screening for malformation using ultrasonics 02/08/2018   Abnormal weight gain 02/08/2018   Acid indigestion 02/08/2018   Acute antritis 02/08/2018   Class 2 severe obesity with serious comorbidity and body mass index (BMI) of 39.0 to 39.9 in adult (  [place]) 02/08/2018   Perennial allergic rhinitis 02/08/2018   Antiphospholipid syndrome ([place]) 02/08/2018   Arthralgia of hip or thigh 02/08/2018   Asthma, cough variant 02/08/2018   Vitamin D deficiency 02/08/2018   Backhand tennis elbow 02/08/2018   Cannot sleep 02/08/2018   Chronic kidney disease (CKD), stage III (moderate) ([place]) 02/08/2018   Current drug use 02/08/2018   Depression, major, single episode, in partial remission ([place]) 02/08/2018   Diabetes mellitus with polyneuropathy ([place]) 02/08/2018   Difficulty hearing 02/08/2018   Disturbance of skin sensation 02/08/2018   Hypertension associated with diabetes ([place]) 02/08/2018   Extremity pain 02/08/2018   Facet syndrome, lumbar 02/08/2018   Factor V Leiden ([place]) 02/08/2018   Fungal infection of nail 02/08/2018   Gastroenteritis and colitis, viral 02/08/2018    Hypoglycemia 02/08/2018   Menopause 02/08/2018   Other long term (current) drug therapy 02/08/2018   Other osteonecrosis, unspecified femur ([place]) 02/08/2018   Pure hypercholesterolemia 02/08/2018   Recurrent major depressive episodes ([place]) 02/08/2018   Syncope and collapse 02/08/2018   Not well controlled moderate persistent asthma 02/08/2018   Cough, persistent 02/08/2018   Restless leg syndrome 01/15/2018   Degeneration of lumbar intervertebral disc 11/21/2017   Tremor of right hand 03/02/2017   Parkinson disease 03/02/2017   UTI (urinary tract infection) 12/01/2015   Type 2 diabetes mellitus with stage 3b chronic kidney disease, with long-term current use of insulin ([place]) 06/03/2015   Fast heart beat 04/09/2015   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 2 diabetes mellitus ([place]) 04/24/2014   Postoperative anemia due to acute blood loss 10/10/2013   Avascular necrosis of bone of left hip ([place]) 10/09/2013   Arthritis of pelvic region, degenerative 10/09/2013   Breath shortness 06/18/2013   Diabetes mellitus type 2, uncontrolled 05/31/2013   Intermittent claudication ([place]) 04/04/2013   Anxiety    Gastroesophageal reflux disease 09/12/2012   Clinical depression 02/21/2012   Stroke ([place]) 02/11/2012   History of revision of total replacement of right hip joint 04/04/2009    ONSET DATE: 12/15/21  REFERRING DIAG: Parkinsonism  THERAPY DIAG:  Muscle weakness (generalized)  Other lack of coordination  Other symptoms and signs involving the nervous system  Attention and concentration deficit  Frontal lobe and executive function deficit  Unsteadiness on feet  Abnormal posture  Other abnormalities of gait and mobility  Rationale for Evaluation and Treatment Rehabilitation  SUBJECTIVE:   SUBJECTIVE STATEMENT: Pt reports no falls since last visit Pt accompanied by: self, husband  PERTINENT HISTORY: PERTINENT HISTORY: Parkinsonism (2018),TIA, thyroid disease, kidney disease, HTN, HLD,  GERD, DMII, depression, L hip avascular necrosis, anxiety, anemia, R THA 2011, L THA 2015, knee arthroscopy   PRECAUTIONS: Fall  WEIGHT BEARING RESTRICTIONS No  PAIN:  Are you having pain? No (only soreness from recent fall)   FALLS: Has patient fallen in last 6 months? Yes. Number of falls 5  LIVING ENVIRONMENT: Lives with: lives with their spouse Lives in: House/apartment  PLOF: Independent  PATIENT GOALS to be able style hair  OBJECTIVE: from eval   HAND DOMINANCE: Right   FUNCTIONAL OUTCOME MEASURES: Fastening/unfastening 3 buttons: 50.68 Physical performance test: PPT#2 (simulated eating) 15.03 & PPT#4 (donning/doffing jacket): 13.34  COORDINATION: 9 Hole Peg test: Right: 36.91 sec; Left: 38.16 sec Box and Blocks:  Right 33blocks, Left 37blocks   TODAY'S TREATMENT:  Began assessing STG's - see goal section. Pt has improved on PPT #2 and Box & Blocks.   Discussion re: encouraging use BUE's to continue to wash, brush,  and style hair while seated to maintain shoulder ROM and functional use of BUE's. Pt encouraged to sit on shower chair when bathing to prevent falls (pt had been standing for some of it). Re-iterated staggered wider stance to prevent falls (especially backwards).  Also reinforced locking brakes on rollator for safety  Reviewed PWR! Moves supine basic 4 - pt demo each x 10 w/ min-mod cues for large amplitude movements. PWR! Step and hip lifts broken down. Cues to look at hands and for performance. Increased time required. Pt's husband reports he can assist pt. with task. Pt was shown how to utilize PWR! Supine to get out of bed and for bed mobility    PATIENT EDUCATION: See above   HOME EXERCISE PROGRAM: 12/27/21: PWR! Seated, coordination HEP, writing strategies 12/30/21: PWR! Hands  02/03/22- PWR! Supine basic 4 02/15/22- recommendation for staggered feet and use of rollator for laundry, education regarding pt visual deficits, inability to track  inferiorly.   ASSESSMENT:  CLINICAL IMPRESSION: Pt is progressing towards goals. She continues to be limited by visual and cognitive deficits. Pt also high fall risk  PERFORMANCE DEFICITS in functional skills including ADLs, IADLs, coordination, dexterity, proprioception, sensation, tone, ROM, strength, pain, flexibility, FMC, GMC, mobility, balance, endurance, decreased knowledge of precautions, decreased knowledge of use of DME, and UE functional use, bradykinesia , cognitive skills including attention, energy/drive, learn, memory, problem solving, safety awareness, thought, and understand, and psychosocial skills including coping strategies, environmental adaptation, habits, interpersonal interactions, and routines and behaviors.   IMPAIRMENTS are limiting patient from ADLs, IADLs, play, leisure, and social participation.   COMORBIDITIES may have co-morbidities  that affects occupational performance. Patient will benefit from skilled OT to address above impairments and improve overall function.  MODIFICATION OR ASSISTANCE TO COMPLETE EVALUATION: No modification of tasks or assist necessary to complete an evaluation.  OT OCCUPATIONAL PROFILE AND HISTORY: Detailed assessment: Review of records and additional review of physical, cognitive, psychosocial history related to current functional performance.  CLINICAL DECISION MAKING: LOW - limited treatment options, no task modification necessary  REHAB POTENTIAL: Good  EVALUATION COMPLEXITY: Low     GOALS: Goals reviewed with patient? No  SHORT TERM GOALS: Target date: 02/21/22- extended due to scheduling  I with PD specific HEP Baseline: Goal status: MET (w/ caregiver cues)   2.  Pt will verbalize understanding of adapted strategies to maximize safety and I with ADLs/ IADLs .  Baseline:  Goal status: ongoing  3.  Pt will demonstrate improved bilateral UE functional use as evidenced by increasing box/ blocks score by 3  blocks Baseline: RUE 33, LUE 37 Goal status: MET (Rt = 38, Lt = 40)   4.  Pt will demonstrate improved ease with feeding as evidenced by decreasing PPT#2 (self feeding) by 3 secs Baseline: 15.03 Goal status: MET (9.06 sec)   5.  Pt will report increased ease with styling her hair Baseline: Pt reports difficulty Goal status: ongoing - pt reports she has stopped doing - therapist encouraged her to continue to do (seated)     LONG TERM GOALS: Target date: 03/17/22-  1.Pt will verbalize understanding of ways to prevent future PD related complications and PD community resources.  Baseline:  Goal status: INITIAL  2.  Pt will demonstrate improved fine motor coordination for ADLs as evidenced by decreasing bilateral  9 hole peg test score for by 3 secs  Baseline: RUE 36.91, LUE 38.16 Goal status: INITIAL  3.  Pt will demonstrate understanding of memory compensations  and ways to keep thinking skills sharp  Baseline:  Goal status: INITIAL  4.  Pt will demonstrate improved ease with fastening buttons as evidenced by decreasing 3 button/ unbutton time to 47 secs or less  Baseline: 50.68 Goal status: INITIAL   PLAN: OT FREQUENCY: 2x/week  OT DURATION: 12 weeks POC written for 12 weeks to account for scheduling  PLANNED INTERVENTIONS: self care/ADL training, therapeutic exercise, therapeutic activity, neuromuscular re-education, manual therapy, passive range of motion, balance training, stair training, functional mobility training, aquatic therapy, ultrasound, paraffin, fluidotherapy, moist heat, cryotherapy, contrast bath, patient/family education, cognitive remediation/compensation, visual/perceptual remediation/compensation, energy conservation, coping strategies training, DME and/or AE instructions, and Re-evaluation  RECOMMENDED OTHER SERVICES: PT  CONSULTED AND AGREED WITH PLAN OF CARE: Patient  PLAN FOR NEXT SESSION: check remaining short term goals and 10th progress note,  continue ADL strategies,  compensation for visual deficits, stepping strategies, functional reaching w/ balance,    Redmond Baseman, OTR/L 03/03/22 12:20 PM Phone [phone] FAX [phone]

## 2022-03-07 DIAGNOSIS — N1831 Chronic kidney disease, stage 3a: Secondary | ICD-10-CM | POA: Diagnosis not present

## 2022-03-08 ENCOUNTER — Encounter (INDEPENDENT_AMBULATORY_CARE_PROVIDER_SITE_OTHER): Payer: Self-pay | Admitting: Family Medicine

## 2022-03-08 ENCOUNTER — Ambulatory Visit (INDEPENDENT_AMBULATORY_CARE_PROVIDER_SITE_OTHER): Payer: 59 | Admitting: Family Medicine

## 2022-03-08 ENCOUNTER — Ambulatory Visit: Payer: 59 | Admitting: Physical Therapy

## 2022-03-08 ENCOUNTER — Ambulatory Visit: Payer: 59 | Attending: Adult Health | Admitting: Occupational Therapy

## 2022-03-08 ENCOUNTER — Other Ambulatory Visit (HOSPITAL_COMMUNITY): Payer: Self-pay

## 2022-03-08 VITALS — BP 116/75 | HR 71 | Temp 97.7°F | Ht 66.0 in | Wt 210.0 lb

## 2022-03-08 DIAGNOSIS — R2681 Unsteadiness on feet: Secondary | ICD-10-CM | POA: Insufficient documentation

## 2022-03-08 DIAGNOSIS — Z794 Long term (current) use of insulin: Secondary | ICD-10-CM | POA: Diagnosis not present

## 2022-03-08 DIAGNOSIS — R41840 Attention and concentration deficit: Secondary | ICD-10-CM | POA: Diagnosis not present

## 2022-03-08 DIAGNOSIS — E1122 Type 2 diabetes mellitus with diabetic chronic kidney disease: Secondary | ICD-10-CM

## 2022-03-08 DIAGNOSIS — N1832 Chronic kidney disease, stage 3b: Secondary | ICD-10-CM | POA: Diagnosis not present

## 2022-03-08 DIAGNOSIS — M6281 Muscle weakness (generalized): Secondary | ICD-10-CM | POA: Diagnosis not present

## 2022-03-08 DIAGNOSIS — R2689 Other abnormalities of gait and mobility: Secondary | ICD-10-CM | POA: Diagnosis not present

## 2022-03-08 DIAGNOSIS — R41844 Frontal lobe and executive function deficit: Secondary | ICD-10-CM | POA: Insufficient documentation

## 2022-03-08 DIAGNOSIS — E669 Obesity, unspecified: Secondary | ICD-10-CM | POA: Diagnosis not present

## 2022-03-08 DIAGNOSIS — Z6833 Body mass index (BMI) 33.0-33.9, adult: Secondary | ICD-10-CM

## 2022-03-08 DIAGNOSIS — R278 Other lack of coordination: Secondary | ICD-10-CM | POA: Diagnosis not present

## 2022-03-08 DIAGNOSIS — R29818 Other symptoms and signs involving the nervous system: Secondary | ICD-10-CM | POA: Diagnosis not present

## 2022-03-08 DIAGNOSIS — R293 Abnormal posture: Secondary | ICD-10-CM | POA: Insufficient documentation

## 2022-03-08 NOTE — Therapy (Signed)
OUTPATIENT OCCUPATIONAL THERAPY [facility]   Patient Name: Deanna Rowe MRN: [id number] DOB:1956/02/06, 66 y.o., female Today's Date: 03/08/2022  PCP: Dr. Radene Ou REFERRING PROVIDER: Ward Givens NP   OT End of Session - 03/08/22 1023     Visit Number 10    Number of Visits 25    Date for OT Re-Evaluation 03/17/22    Authorization Type [facility], [facility] secondary    Authorization - Visit Number 10    Authorization - Number of Visits 12    OT Start Time 1020    OT Stop Time 1100    OT Time Calculation (min) 40 min    Activity Tolerance Patient tolerated treatment well    Behavior During Therapy WFL for tasks assessed/performed                Past Medical History:  Diagnosis Date   Anemia    Anxiety    Arthritis    L HIP   Asthma    Avascular necrosis of hip ([place])    LEFT   Back pain    Chest pain    Constipation    Depression    Diabetes mellitus    Diabetes mellitus, type II (HCC)    Dry cough    Edema, lower extremity    Gait abnormality 11/18/2019   GERD (gastroesophageal reflux disease)    High cholesterol    Hypertension    Insomnia    takes Ambien nightly   Joint pain    Kidney disease    Kidney disease    Obesity    Parkinsonism 03/02/2017   PONV (postoperative nausea and vomiting)    RLS (restless legs syndrome) 01/15/2018   Sleep apnea    Swallowing difficulty    Thyroid disease    TIA (transient ischemic attack) 2012   no residual problems   Urgency incontinence    Vitamin D deficiency    Past Surgical History:  Procedure Laterality Date   DILATION AND CURETTAGE OF UTERUS     ESOPHAGEAL MANOMETRY N/A 09/03/2012   Procedure: ESOPHAGEAL MANOMETRY (EM);  Surgeon: Garlan Fair, MD;  [facility];  Service: Endoscopy;  Laterality: N/A;   ESOPHAGEAL MANOMETRY N/A 06/19/2017   Procedure: ESOPHAGEAL MANOMETRY (EM);  Surgeon: Clarene Essex, MD;  [facility];  Service: Endoscopy;  Laterality: N/A;    EXPLORATORY LAPAROTOMY     GANGLION CYST EXCISION     JOINT REPLACEMENT  2011   rt total hip   KNEE ARTHROSCOPY     PILONIDAL CYST / SINUS EXCISION     TOTAL HIP ARTHROPLASTY Left 10/09/2013   Procedure: LEFT TOTAL HIP ARTHROPLASTY ANTERIOR APPROACH;  Surgeon: [name], MD;  [facility];  Service: Orthopedics;  Laterality: Left;   Patient Active Problem List   Diagnosis Date Noted   Allergic conjunctivitis of both eyes 07/06/2020   Gait abnormality 11/18/2019   OSA on CPAP 06/04/2019   Pain in joint of left shoulder 03/28/2019   Seasonal allergic conjunctivitis 02/07/2019   Chronic intermittent hypoxia with obstructive sleep apnea 01/25/2019   Severe obstructive sleep apnea 01/25/2019   Excessive daytime sleepiness 01/03/2019   Non-restorative sleep 01/03/2019   Nocturia more than twice per night 01/03/2019   Weight gain, abnormal 01/03/2019   Snorings 01/03/2019   Trochanteric bursitis of right hip 04/27/2018   Encounter for routine screening for malformation using ultrasonics 02/08/2018   Abnormal weight gain 02/08/2018   Acid indigestion 02/08/2018   Acute antritis  02/08/2018   Class 2 severe obesity with serious comorbidity and body mass index (BMI) of 39.0 to 39.9 in adult [facility]) 02/08/2018   Perennial allergic rhinitis 02/08/2018   Antiphospholipid syndrome ([place]) 02/08/2018   Arthralgia of hip or thigh 02/08/2018   Asthma, cough variant 02/08/2018   Vitamin D deficiency 02/08/2018   Backhand tennis elbow 02/08/2018   Cannot sleep 02/08/2018   Chronic kidney disease (CKD), stage III (moderate) ([place]) 02/08/2018   Current drug use 02/08/2018   Depression, major, single episode, in partial remission ([place]) 02/08/2018   Diabetes mellitus with polyneuropathy ([place]) 02/08/2018   Difficulty hearing 02/08/2018   Disturbance of skin sensation 02/08/2018   Hypertension associated with diabetes ([place]) 02/08/2018   Extremity pain 02/08/2018   Facet syndrome, lumbar  02/08/2018   Factor V Leiden ([place]) 02/08/2018   Fungal infection of nail 02/08/2018   Gastroenteritis and colitis, viral 02/08/2018   Hypoglycemia 02/08/2018   Menopause 02/08/2018   Other long term (current) drug therapy 02/08/2018   Other osteonecrosis, unspecified femur ([place]) 02/08/2018   Pure hypercholesterolemia 02/08/2018   Recurrent major depressive episodes ([place]) 02/08/2018   Syncope and collapse 02/08/2018   Not well controlled moderate persistent asthma 02/08/2018   Cough, persistent 02/08/2018   Restless leg syndrome 01/15/2018   Degeneration of lumbar intervertebral disc 11/21/2017   Tremor of right hand 03/02/2017   Parkinson disease 03/02/2017   UTI (urinary tract infection) 12/01/2015   Type 2 diabetes mellitus with stage 3b chronic kidney disease, with long-term current use of insulin ([place]) 06/03/2015   Fast heart beat 04/09/2015   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 2 diabetes mellitus ([place]) 04/24/2014   Postoperative anemia due to acute blood loss 10/10/2013   Avascular necrosis of bone of left hip ([place]) 10/09/2013   Arthritis of pelvic region, degenerative 10/09/2013   Breath shortness 06/18/2013   Diabetes mellitus type 2, uncontrolled 05/31/2013   Intermittent claudication ([place]) 04/04/2013   Anxiety    Gastroesophageal reflux disease 09/12/2012   Clinical depression 02/21/2012   Stroke ([place]) 02/11/2012   History of revision of total replacement of right hip joint 04/04/2009    ONSET DATE: 12/15/21  REFERRING DIAG: Parkinsonism  THERAPY DIAG:  Unsteadiness on feet  Other symptoms and signs involving the nervous system  Attention and concentration deficit  Muscle weakness (generalized)  Other lack of coordination  Rationale for Evaluation and Treatment Rehabilitation  SUBJECTIVE:   SUBJECTIVE STATEMENT: Pt reports no falls since last visit Pt accompanied by: self, husband  PERTINENT HISTORY: PERTINENT HISTORY: Parkinsonism (2018),TIA, thyroid disease,  kidney disease, HTN, HLD, GERD, DMII, depression, L hip avascular necrosis, anxiety, anemia, R THA 2011, L THA 2015, knee arthroscopy   PRECAUTIONS: Fall  WEIGHT BEARING RESTRICTIONS No  PAIN:  Are you having pain? No (only soreness from recent fall)   FALLS: Has patient fallen in last 6 months? Yes. Number of falls 5  LIVING ENVIRONMENT: Lives with: lives with their spouse Lives in: House/apartment  PLOF: Independent  PATIENT GOALS to be able style hair  OBJECTIVE: from eval   HAND DOMINANCE: Right   FUNCTIONAL OUTCOME MEASURES: Fastening/unfastening 3 buttons: 50.68 Physical performance test: PPT#2 (simulated eating) 15.03 & PPT#4 (donning/doffing jacket): 13.34  COORDINATION: 9 Hole Peg test: Right: 36.91 sec; Left: 38.16 sec Box and Blocks:  Right 33blocks, Left 37blocks   TODAY'S TREATMENT:  Assessed remaining STG's - see below. Reinforced ADL strategies for safety  Pt issued 3 bag exercises to help maintain ROM and in prep for  dressing tasks. Pt performed each x 10 reps Re-issued coordination HEP and PWR! Seated HEP. Also discussed bringing in all ex's to help organize and develop PD ex chart next session  UBE x 6 mintues, level 1 for UE strength/endurance maintaining 30 RPM or greater    PATIENT EDUCATION: See above   HOME EXERCISE PROGRAM: 12/27/21: PWR! Seated, coordination HEP, writing strategies 12/30/21: PWR! Hands  02/03/22- PWR! Supine basic 4 02/15/22- recommendation for staggered feet and use of rollator for laundry, education regarding pt visual deficits, inability to track inferiorly. 03/08/22: Bag ex's for ADLS   ASSESSMENT:  CLINICAL IMPRESSION: This 10th progress note is for dates: 12/23/21 to 03/08/22: Pt has made progress in safety and function meeting 4/5 STG's at this time. Pt remains limited by visual and cognitive deficits.  PERFORMANCE DEFICITS in functional skills including ADLs, IADLs, coordination, dexterity, proprioception,  sensation, tone, ROM, strength, pain, flexibility, FMC, GMC, mobility, balance, endurance, decreased knowledge of precautions, decreased knowledge of use of DME, and UE functional use, bradykinesia , cognitive skills including attention, energy/drive, learn, memory, problem solving, safety awareness, thought, and understand, and psychosocial skills including coping strategies, environmental adaptation, habits, interpersonal interactions, and routines and behaviors.   IMPAIRMENTS are limiting patient from ADLs, IADLs, play, leisure, and social participation.   COMORBIDITIES may have co-morbidities  that affects occupational performance. Patient will benefit from skilled OT to address above impairments and improve overall function.  MODIFICATION OR ASSISTANCE TO COMPLETE EVALUATION: No modification of tasks or assist necessary to complete an evaluation.  OT OCCUPATIONAL PROFILE AND HISTORY: Detailed assessment: Review of records and additional review of physical, cognitive, psychosocial history related to current functional performance.  CLINICAL DECISION MAKING: LOW - limited treatment options, no task modification necessary  REHAB POTENTIAL: Good  EVALUATION COMPLEXITY: Low     GOALS: Goals reviewed with patient? No  SHORT TERM GOALS: Target date: 02/21/22- extended due to scheduling  I with PD specific HEP Baseline: Goal status: MET (w/ caregiver cues)   2.  Pt will verbalize understanding of adapted strategies to maximize safety and I with ADLs/ IADLs .  Baseline:  Goal status: MET  3.  Pt will demonstrate improved bilateral UE functional use as evidenced by increasing box/ blocks score by 3 blocks Baseline: RUE 33, LUE 37 Goal status: MET (Rt = 38, Lt = 40)   4.  Pt will demonstrate improved ease with feeding as evidenced by decreasing PPT#2 (self feeding) by 3 secs Baseline: 15.03 Goal status: MET (9.06 sec)   5.  Pt will report increased ease with styling her  hair Baseline: Pt reports difficulty Goal status: ongoing - pt reports she has stopped doing - therapist encouraged her to continue to do (seated)     LONG TERM GOALS: Target date: 03/17/22-  1.Pt will verbalize understanding of ways to prevent future PD related complications and PD community resources.  Baseline:  Goal status: INITIAL  2.  Pt will demonstrate improved fine motor coordination for ADLs as evidenced by decreasing bilateral  9 hole peg test score for by 3 secs  Baseline: RUE 36.91, LUE 38.16 Goal status: INITIAL  3.  Pt will demonstrate understanding of memory compensations and ways to keep thinking skills sharp  Baseline:  Goal status: INITIAL  4.  Pt will demonstrate improved ease with fastening buttons as evidenced by decreasing 3 button/ unbutton time to 47 secs or less  Baseline: 50.68 Goal status: INITIAL   PLAN: OT FREQUENCY: 2x/week  OT DURATION: 12  weeks POC written for 12 weeks to account for scheduling  PLANNED INTERVENTIONS: self care/ADL training, therapeutic exercise, therapeutic activity, neuromuscular re-education, manual therapy, passive range of motion, balance training, stair training, functional mobility training, aquatic therapy, ultrasound, paraffin, fluidotherapy, moist heat, cryotherapy, contrast bath, patient/family education, cognitive remediation/compensation, visual/perceptual remediation/compensation, energy conservation, coping strategies training, DME and/or AE instructions, and Re-evaluation  RECOMMENDED OTHER SERVICES: PT  CONSULTED AND AGREED WITH PLAN OF CARE: Patient  PLAN FOR NEXT SESSION: pt to bring in all HEP's for therapist to assist in organizing and developing PD ex chart for greater carryover, continue ADL strategies,  compensation for visual deficits, stepping strategies, functional reaching w/ balance,    Redmond Baseman, OTR/L 03/08/22 10:24 AM Phone [phone] FAX [phone]

## 2022-03-08 NOTE — Patient Instructions (Signed)
Bag Exercises:  Small trash bag or produce bag works best.  For all exercises, sit with big posture (sit up tall with head up) and use big movements. Perform the following exercises 1 times per day (IN THE MORNING BEFORE DRESSING).  Hold bag in one hand. Stretch both arms/hands out to the side as big as you can. Then, pass bag from one hand to the other BEHIND you. Stretch arms back out big after each pass. Repeat 10 times.  Hold bag in both hands in front of you with hands/arms shoulder length apart. Move bag up to ceiling, then behind your head, and back down. Repeat 10 times.  Hold bag in both hands in front of you with hands/arms shoulder length apart. Lift leg and move bag completely under each foot and back. Repeat 10 times on each side.

## 2022-03-10 ENCOUNTER — Ambulatory Visit: Payer: 59 | Admitting: Occupational Therapy

## 2022-03-10 ENCOUNTER — Encounter: Payer: Self-pay | Admitting: Occupational Therapy

## 2022-03-10 DIAGNOSIS — R41844 Frontal lobe and executive function deficit: Secondary | ICD-10-CM | POA: Diagnosis not present

## 2022-03-10 DIAGNOSIS — M6281 Muscle weakness (generalized): Secondary | ICD-10-CM

## 2022-03-10 DIAGNOSIS — R278 Other lack of coordination: Secondary | ICD-10-CM

## 2022-03-10 DIAGNOSIS — R293 Abnormal posture: Secondary | ICD-10-CM

## 2022-03-10 DIAGNOSIS — R41840 Attention and concentration deficit: Secondary | ICD-10-CM

## 2022-03-10 DIAGNOSIS — R29818 Other symptoms and signs involving the nervous system: Secondary | ICD-10-CM | POA: Diagnosis not present

## 2022-03-10 DIAGNOSIS — R2689 Other abnormalities of gait and mobility: Secondary | ICD-10-CM | POA: Diagnosis not present

## 2022-03-10 DIAGNOSIS — R2681 Unsteadiness on feet: Secondary | ICD-10-CM | POA: Diagnosis not present

## 2022-03-10 NOTE — Therapy (Signed)
OUTPATIENT OCCUPATIONAL THERAPY [facility]   Patient Name: Deanna Rowe MRN: [id number] DOB:1955/04/25, 66 y.o., female Today's Date: 03/08/2022  PCP: Dr. Radene Ou REFERRING PROVIDER: Ward Givens NP   OT End of Session - 03/08/22 1023     Visit Number 10    Number of Visits 25    Date for OT Re-Evaluation 03/17/22    Authorization Type [facility], MCR secondary    Authorization - Visit Number 10    Authorization - Number of Visits 12    OT Start Time 1020    OT Stop Time 1100    OT Time Calculation (min) 40 min    Activity Tolerance Patient tolerated treatment well    Behavior During Therapy WFL for tasks assessed/performed                Past Medical History:  Diagnosis Date   Anemia    Anxiety    Arthritis    L HIP   Asthma    Avascular necrosis of hip ([place])    LEFT   Back pain    Chest pain    Constipation    Depression    Diabetes mellitus    Diabetes mellitus, type II (HCC)    Dry cough    Edema, lower extremity    Gait abnormality 11/18/2019   GERD (gastroesophageal reflux disease)    High cholesterol    Hypertension    Insomnia    takes Ambien nightly   Joint pain    Kidney disease    Kidney disease    Obesity    Parkinsonism 03/02/2017   PONV (postoperative nausea and vomiting)    RLS (restless legs syndrome) 01/15/2018   Sleep apnea    Swallowing difficulty    Thyroid disease    TIA (transient ischemic attack) 2012   no residual problems   Urgency incontinence    Vitamin D deficiency    Past Surgical History:  Procedure Laterality Date   DILATION AND CURETTAGE OF UTERUS     ESOPHAGEAL MANOMETRY N/A 09/03/2012   Procedure: ESOPHAGEAL MANOMETRY (EM);  Surgeon: Garlan Fair, MD;  Location: [facility];  Service: Endoscopy;  Laterality: N/A;   ESOPHAGEAL MANOMETRY N/A 06/19/2017   Procedure: ESOPHAGEAL MANOMETRY (EM);  Surgeon: Clarene Essex, MD;  Location: [facility];  Service: Endoscopy;  Laterality: N/A;    EXPLORATORY LAPAROTOMY     GANGLION CYST EXCISION     JOINT REPLACEMENT  2011   rt total hip   KNEE ARTHROSCOPY     PILONIDAL CYST / SINUS EXCISION     TOTAL HIP ARTHROPLASTY Left 10/09/2013   Procedure: LEFT TOTAL HIP ARTHROPLASTY ANTERIOR APPROACH;  Surgeon: [name], MD;  Location: [facility];  Service: Orthopedics;  Laterality: Left;   Patient Active Problem List   Diagnosis Date Noted   Allergic conjunctivitis of both eyes 07/06/2020   Gait abnormality 11/18/2019   OSA on CPAP 06/04/2019   Pain in joint of left shoulder 03/28/2019   Seasonal allergic conjunctivitis 02/07/2019   Chronic intermittent hypoxia with obstructive sleep apnea 01/25/2019   Severe obstructive sleep apnea 01/25/2019   Excessive daytime sleepiness 01/03/2019   Non-restorative sleep 01/03/2019   Nocturia more than twice per night 01/03/2019   Weight gain, abnormal 01/03/2019   S[place]ings 01/03/2019   Trochanteric bursitis of right hip 04/27/2018   Encounter for routine screening for malformation using ultrasonics 02/08/2018   Abnormal weight gain 02/08/2018   Acid indigestion 02/08/2018   Acute antritis  02/08/2018   Class 2 severe obesity with serious comorbidity and body mass index (BMI) of 39.0 to 39.9 in adult ([place]) 02/08/2018   Perennial allergic rhinitis 02/08/2018   Antiphospholipid syndrome ([place]) 02/08/2018   Arthralgia of hip or thigh 02/08/2018   Asthma, cough variant 02/08/2018   Vitamin D deficiency 02/08/2018   Backhand tennis elbow 02/08/2018   Cannot sleep 02/08/2018   Chronic kidney disease (CKD), stage III (moderate) ([place]) 02/08/2018   Current drug use 02/08/2018   Depression, major, single episode, in partial remission ([place]) 02/08/2018   Diabetes mellitus with polyneuropathy ([place]) 02/08/2018   Difficulty hearing 02/08/2018   Disturbance of skin sensation 02/08/2018   Hypertension associated with diabetes ([place]) 02/08/2018   Extremity pain 02/08/2018   Facet syndrome, lumbar  02/08/2018   Factor V Leiden ([place]) 02/08/2018   Fungal infection of nail 02/08/2018   Gastroenteritis and colitis, viral 02/08/2018   Hypoglycemia 02/08/2018   Menopause 02/08/2018   Other long term (current) drug therapy 02/08/2018   Other osteonecrosis, unspecified femur ([place]) 02/08/2018   Pure hypercholesterolemia 02/08/2018   Recurrent major depressive episodes ([place]) 02/08/2018   Syncope and collapse 02/08/2018   Not well controlled moderate persistent asthma 02/08/2018   Cough, persistent 02/08/2018   Restless leg syndrome 01/15/2018   Degeneration of lumbar intervertebral disc 11/21/2017   Tremor of right hand 03/02/2017   Parkinson disease 03/02/2017   UTI (urinary tract infection) 12/01/2015   Type 2 diabetes mellitus with stage 3b chronic kidney disease, with long-term current use of insulin ([place]) 06/03/2015   Fast heart beat 04/09/2015   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 2 diabetes mellitus ([place]) 04/24/2014   Postoperative anemia due to acute blood loss 10/10/2013   Avascular necrosis of bone of left hip ([place]) 10/09/2013   Arthritis of pelvic region, degenerative 10/09/2013   Breath shortness 06/18/2013   Diabetes mellitus type 2, uncontrolled 05/31/2013   Intermittent claudication ([place]) 04/04/2013   Anxiety    Gastroesophageal reflux disease 09/12/2012   Clinical depression 02/21/2012   Stroke ([place]) 02/11/2012   History of revision of total replacement of right hip joint 04/04/2009    ONSET DATE: 12/15/21  REFERRING DIAG: Parkinsonism  THERAPY DIAG:  Unsteadiness on feet  Other symptoms and signs involving the nervous system  Attention and concentration deficit  Muscle weakness (generalized)  Other lack of coordination  Rationale for Evaluation and Treatment Rehabilitation  SUBJECTIVE:   SUBJECTIVE STATEMENT: Denies pain Pt accompanied by: self, husband  PERTINENT HISTORY: PERTINENT HISTORY: Parkinsonism (2018),TIA, thyroid disease, kidney disease, HTN, HLD,  GERD, DMII, depression, L hip avascular necrosis, anxiety, anemia, R THA 2011, L THA 2015, knee arthroscopy   PRECAUTIONS: Fall  WEIGHT BEARING RESTRICTIONS No  PAIN:  Are you having pain? No (only soreness from recent fall)   FALLS: Has patient fallen in last 6 months? Yes. Number of falls 5  LIVING ENVIRONMENT: Lives with: lives with their spouse Lives in: House/apartment  PLOF: Independent  PATIENT GOALS to be able style hair  OBJECTIVE: from eval   HAND DOMINANCE: Right   FUNCTIONAL OUTCOME MEASURES: Fastening/unfastening 3 buttons: 50.68 Physical performance test: PPT#2 (simulated eating) 15.03 & PPT#4 (donning/doffing jacket): 13.34  COORDINATION: 9 Hole Peg test: Right: 36.91 sec; Left: 38.16 sec Box and Blocks:  Right 33blocks, Left 37blocks   TODAY'S TREATMENT:  Seated PWR! Moves 10 reps each, min v.c for amplitude and posture,  Bag exercises for simulated donning socks, and putting on shirt, min difficulty/ v.c Pt demonstrates max difficulty  with putting bag under each foot  for donning pants, therefore pt transitioned to ankle touches with near loss of balance, pt/ and husband were instructed that pt should not perform at home without supervision due to risk for falls. Dynamic functional reaching to right and left sides with trunk rotation, min v.c for upright posture and larger amplitude movements       PATIENT EDUCATION: Education details: PWR! moves basic 4 in sitting, bag exercises review  Person educated: Patient and Spouse Education method: Explanation, Demonstration, and Verbal cues Education comprehension: verbalized understanding, returned demonstration, and verbal cues required    HOME EXERCISE PROGRAM: 12/27/21: PWR! Seated, coordination HEP, writing strategies 12/30/21: PWR! Hands  02/03/22- PWR! Supine basic 4 02/15/22- recommendation for staggered feet and use of rollator for laundry, education regarding pt visual deficits, inability to  track inferiorly. 03/08/22: Bag ex's for ADLS   ASSESSMENT:  CLINICAL IMPRESSION: Pt is progressing towards goals. She remains limited by decreased balance and cognitive deficits. PERFORMANCE DEFICITS in functional skills including ADLs, IADLs, coordination, dexterity, proprioception, sensation, tone, ROM, strength, pain, flexibility, FMC, GMC, mobility, balance, endurance, decreased knowledge of precautions, decreased knowledge of use of DME, and UE functional use, bradykinesia , cognitive skills including attention, energy/drive, learn, memory, problem solving, safety awareness, thought, and understand, and psychosocial skills including coping strategies, environmental adaptation, habits, interpersonal interactions, and routines and behaviors.   IMPAIRMENTS are limiting patient from ADLs, IADLs, play, leisure, and social participation.   COMORBIDITIES may have co-morbidities  that affects occupational performance. Patient will benefit from skilled OT to address above impairments and improve overall function.  MODIFICATION OR ASSISTANCE TO COMPLETE EVALUATION: No modification of tasks or assist necessary to complete an evaluation.  OT OCCUPATIONAL PROFILE AND HISTORY: Detailed assessment: Review of records and additional review of physical, cognitive, psychosocial history related to current functional performance.  CLINICAL DECISION MAKING: LOW - limited treatment options, no task modification necessary  REHAB POTENTIAL: Good  EVALUATION COMPLEXITY: Low     GOALS: Goals reviewed with patient? No  SHORT TERM GOALS: Target date: 02/21/22- extended due to scheduling  I with PD specific HEP Baseline: Goal status: MET (w/ caregiver cues)   2.  Pt will verbalize understanding of adapted strategies to maximize safety and I with ADLs/ IADLs .  Baseline:  Goal status: MET  3.  Pt will demonstrate improved bilateral UE functional use as evidenced by increasing box/ blocks score by 3  blocks Baseline: RUE 33, LUE 37 Goal status: MET (Rt = 38, Lt = 40)   4.  Pt will demonstrate improved ease with feeding as evidenced by decreasing PPT#2 (self feeding) by 3 secs Baseline: 15.03 Goal status: MET (9.06 sec)   5.  Pt will report increased ease with styling her hair Baseline: Pt reports difficulty Goal status: ongoing - pt reports she has stopped doing - therapist encouraged her to continue to do (seated)     LONG TERM GOALS: Target date: 03/17/22-  1.Pt will verbalize understanding of ways to prevent future PD related complications and PD community resources.  Baseline:  Goal status: INITIAL  2.  Pt will demonstrate improved fine motor coordination for ADLs as evidenced by decreasing bilateral  9 hole peg test score for by 3 secs  Baseline: RUE 36.91, LUE 38.16 Goal status: INITIAL  3.  Pt will demonstrate understanding of memory compensations and ways to keep thinking skills sharp  Baseline:  Goal status: INITIAL  4.  Pt will demonstrate improved ease with  fastening buttons as evidenced by decreasing 3 button/ unbutton time to 47 secs or less  Baseline: 50.68 Goal status: INITIAL   PLAN: OT FREQUENCY: 2x/week  OT DURATION: 12 weeks POC written for 12 weeks to account for scheduling  PLANNED INTERVENTIONS: self care/ADL training, therapeutic exercise, therapeutic activity, neuromuscular re-education, manual therapy, passive range of motion, balance training, stair training, functional mobility training, aquatic therapy, ultrasound, paraffin, fluidotherapy, moist heat, cryotherapy, contrast bath, patient/family education, cognitive remediation/compensation, visual/perceptual remediation/compensation, energy conservation, coping strategies training, DME and/or AE instructions, and Re-evaluation  RECOMMENDED OTHER SERVICES: PT  CONSULTED AND AGREED WITH PLAN OF CARE: Patient  PLAN FOR NEXT SESSION: check long term goals discuss plans to nrenew vs d/c, pt to  bring in all HEP's for therapist to assist in organizing and developing PD ex chart for greater carryover- pt forgot today, ADL strategies, check long term goals next week   Theone Murdoch, OTR/L [phone] Phone: [phone] 10:23 AM 03/10/22  03/08/22 10:24 AM Phone [phone] FAX [phone]

## 2022-03-12 DIAGNOSIS — E1165 Type 2 diabetes mellitus with hyperglycemia: Secondary | ICD-10-CM | POA: Diagnosis not present

## 2022-03-12 DIAGNOSIS — Z794 Long term (current) use of insulin: Secondary | ICD-10-CM | POA: Diagnosis not present

## 2022-03-14 ENCOUNTER — Other Ambulatory Visit (HOSPITAL_BASED_OUTPATIENT_CLINIC_OR_DEPARTMENT_OTHER): Payer: Self-pay

## 2022-03-14 ENCOUNTER — Ambulatory Visit (INDEPENDENT_AMBULATORY_CARE_PROVIDER_SITE_OTHER): Payer: 59 | Admitting: Podiatry

## 2022-03-14 ENCOUNTER — Encounter: Payer: Self-pay | Admitting: Podiatry

## 2022-03-14 ENCOUNTER — Ambulatory Visit (INDEPENDENT_AMBULATORY_CARE_PROVIDER_SITE_OTHER): Payer: 59

## 2022-03-14 VITALS — BP 137/81 | HR 100

## 2022-03-14 DIAGNOSIS — M722 Plantar fascial fibromatosis: Secondary | ICD-10-CM

## 2022-03-14 DIAGNOSIS — D631 Anemia in chronic kidney disease: Secondary | ICD-10-CM | POA: Diagnosis not present

## 2022-03-14 DIAGNOSIS — E559 Vitamin D deficiency, unspecified: Secondary | ICD-10-CM | POA: Diagnosis not present

## 2022-03-14 DIAGNOSIS — N1831 Chronic kidney disease, stage 3a: Secondary | ICD-10-CM | POA: Diagnosis not present

## 2022-03-14 DIAGNOSIS — I129 Hypertensive chronic kidney disease with stage 1 through stage 4 chronic kidney disease, or unspecified chronic kidney disease: Secondary | ICD-10-CM | POA: Diagnosis not present

## 2022-03-14 MED ORDER — BETAMETHASONE SOD PHOS & ACET 6 (3-3) MG/ML IJ SUSP
3.0000 mg | Freq: Once | INTRAMUSCULAR | Status: AC
  Administered 2022-03-14: 3 mg via INTRA_ARTICULAR

## 2022-03-14 MED ORDER — MELOXICAM 15 MG PO TABS
15.0000 mg | ORAL_TABLET | Freq: Every day | ORAL | 1 refills | Status: DC
  Filled 2022-03-14: qty 30, 30d supply, fill #0
  Filled 2022-05-05: qty 30, 30d supply, fill #1

## 2022-03-14 NOTE — Progress Notes (Signed)
Chief Complaint  Patient presents with   Foot Pain    Patient is here complaining of bilateral foot pain, patent states that she has had pain for 3 weeks.    Subjective: 66 y.o. female PMHx diabetes mellitus with polyneuropathy, Parkinson's presenting for evaluation of right heel pain that is been ongoing for few months now.  Gradual onset.  Denies a history of injury.  Has not anything for treatment.   Past Medical History:  Diagnosis Date   Anemia    Anxiety    Arthritis    L HIP   Asthma    Avascular necrosis of hip ([place])    LEFT   Back pain    Chest pain    Constipation    Depression    Diabetes mellitus    Diabetes mellitus, type II (HCC)    Dry cough    Edema, lower extremity    Gait abnormality 11/18/2019   GERD (gastroesophageal reflux disease)    High cholesterol    Hypertension    Insomnia    takes Ambien nightly   Joint pain    Kidney disease    Kidney disease    Obesity    Parkinsonism 03/02/2017   PONV (postoperative nausea and vomiting)    RLS (restless legs syndrome) 01/15/2018   Sleep apnea    Swallowing difficulty    Thyroid disease    TIA (transient ischemic attack) 2012   no residual problems   Urgency incontinence    Vitamin D deficiency    Past Surgical History:  Procedure Laterality Date   DILATION AND CURETTAGE OF UTERUS     ESOPHAGEAL MANOMETRY N/A 09/03/2012   Procedure: ESOPHAGEAL MANOMETRY (EM);  Surgeon: Garlan Fair, MD;  [facility];  Service: Endoscopy;  Laterality: N/A;   ESOPHAGEAL MANOMETRY N/A 06/19/2017   Procedure: ESOPHAGEAL MANOMETRY (EM);  Surgeon: Clarene Essex, MD;  [facility];  Service: Endoscopy;  Laterality: N/A;   EXPLORATORY LAPAROTOMY     GANGLION CYST EXCISION     JOINT REPLACEMENT  2011   rt total hip   KNEE ARTHROSCOPY     PILONIDAL CYST / SINUS EXCISION     TOTAL HIP ARTHROPLASTY Left 10/09/2013   Procedure: LEFT TOTAL HIP ARTHROPLASTY ANTERIOR APPROACH;  Surgeon: [name],  MD;  Location: [facility];  Service: Orthopedics;  Laterality: Left;   Allergies  Allergen Reactions   Fluticasone-Salmeterol Anaphylaxis   Codeine Nausea And Vomiting   Fetzima [Levomilnacipran] Nausea And Vomiting   Nsaids Other (See Comments)    Upset stomach    Trulicity [Dulaglutide] Nausea And Vomiting    Significant and undesirably rapid (40 lbs) weight loss    Xanax [Alprazolam] Other (See Comments)    disoriented   Amoxicillin Rash    Has patient had a PCN reaction causing immediate rash, facial/tongue/throat swelling, SOB or lightheadedness with hypotension: Yes Has patient had a PCN reaction causing severe rash involving mucus membranes or skin necrosis: No Has patient had a PCN reaction that required hospitalization: No Has patient had a PCN reaction occurring within the last 10 years: No If all of the above answers are "NO", then may proceed with Cephalosporin use.    Pseudoephedrine Hcl Er Palpitations     Objective: Physical Exam General: The patient is alert and oriented x3 in no acute distress.  Dermatology: Skin is warm, dry and supple bilateral lower extremities. Negative for open lesions or macerations bilateral.   Vascular: Dorsalis Pedis and Posterior  Tibial pulses palpable bilateral.  Capillary fill time is immediate to all digits.  Neurological: Epicritic and protective threshold intact bilateral.   Musculoskeletal: Tenderness to palpation to the plantar aspect of the right heel along the plantar fascia. All other joints range of motion within normal limits bilateral. Strength 5/5 in all groups bilateral.   Radiographic exam B/L feet 03/14/2022: Normal osseous mineralization. Joint spaces preserved. No fracture/dislocation/boney destruction. No other soft tissue abnormalities or radiopaque foreign bodies.  Plantar heel spurs noted on bilateral lateral views  Assessment: 1. Plantar fasciitis right  Plan of Care:  1. Patient evaluated. Xrays reviewed.    2. Injection of 0.5cc Celestone soluspan injected into the right plantar fascia  3. Rx for Meloxicam ordered for patient. 4.  Instructed patient regarding therapies and modalities at home to alleviate symptoms.  5. Return to clinic in 4 weeks.     Edrick Kins, DPM [facility]  Dr. Edrick Kins, DPM    [address], [place] [address]                Office [phone]  Fax [phone]

## 2022-03-15 ENCOUNTER — Ambulatory Visit: Payer: 59 | Admitting: Physical Therapy

## 2022-03-15 ENCOUNTER — Ambulatory Visit: Payer: 59 | Admitting: Occupational Therapy

## 2022-03-15 ENCOUNTER — Encounter: Payer: Self-pay | Admitting: Physical Therapy

## 2022-03-15 DIAGNOSIS — R41840 Attention and concentration deficit: Secondary | ICD-10-CM

## 2022-03-15 DIAGNOSIS — M6281 Muscle weakness (generalized): Secondary | ICD-10-CM

## 2022-03-15 DIAGNOSIS — R29818 Other symptoms and signs involving the nervous system: Secondary | ICD-10-CM

## 2022-03-15 DIAGNOSIS — R293 Abnormal posture: Secondary | ICD-10-CM

## 2022-03-15 DIAGNOSIS — R278 Other lack of coordination: Secondary | ICD-10-CM | POA: Diagnosis not present

## 2022-03-15 DIAGNOSIS — R41844 Frontal lobe and executive function deficit: Secondary | ICD-10-CM

## 2022-03-15 DIAGNOSIS — R2689 Other abnormalities of gait and mobility: Secondary | ICD-10-CM

## 2022-03-15 DIAGNOSIS — R2681 Unsteadiness on feet: Secondary | ICD-10-CM | POA: Diagnosis not present

## 2022-03-15 NOTE — Therapy (Signed)
OUTPATIENT OCCUPATIONAL THERAPY [facility]   Patient Name: Deanna Rowe MRN: [id number] DOB:14-May-1955, 66 y.o., female Today's Date: 03/15/2022  PCP: Dr. Radene Ou REFERRING PROVIDER: Ward Givens NP   OT End of Session - 03/15/22 1439     Visit Number 12    Number of Visits 25    Date for OT Re-Evaluation 03/17/22    Authorization Type [facility], MCR secondary    Authorization - Visit Number 12    Authorization - Number of Visits 12    OT Start Time 1018    OT Stop Time 1100    OT Time Calculation (min) 42 min    Activity Tolerance Patient tolerated treatment well    Behavior During Therapy WFL for tasks assessed/performed                 Past Medical History:  Diagnosis Date   Anemia    Anxiety    Arthritis    L HIP   Asthma    Avascular necrosis of hip ([place])    LEFT   Back pain    Chest pain    Constipation    Depression    Diabetes mellitus    Diabetes mellitus, type II (HCC)    Dry cough    Edema, lower extremity    Gait abnormality 11/18/2019   GERD (gastroesophageal reflux disease)    High cholesterol    Hypertension    Insomnia    takes Ambien nightly   Joint pain    Kidney disease    Kidney disease    Obesity    Parkinsonism 03/02/2017   PONV (postoperative nausea and vomiting)    RLS (restless legs syndrome) 01/15/2018   Sleep apnea    Swallowing difficulty    Thyroid disease    TIA (transient ischemic attack) 2012   no residual problems   Urgency incontinence    Vitamin D deficiency    Past Surgical History:  Procedure Laterality Date   DILATION AND CURETTAGE OF UTERUS     ESOPHAGEAL MANOMETRY N/A 09/03/2012   Procedure: ESOPHAGEAL MANOMETRY (EM);  Surgeon: Garlan Fair, MD;  Location: [facility];  Service: Endoscopy;  Laterality: N/A;   ESOPHAGEAL MANOMETRY N/A 06/19/2017   Procedure: ESOPHAGEAL MANOMETRY (EM);  Surgeon: Clarene Essex, MD;  Location: [facility];  Service: Endoscopy;  Laterality: N/A;    EXPLORATORY LAPAROTOMY     GANGLION CYST EXCISION     JOINT REPLACEMENT  2011   rt total hip   KNEE ARTHROSCOPY     PILONIDAL CYST / SINUS EXCISION     TOTAL HIP ARTHROPLASTY Left 10/09/2013   Procedure: LEFT TOTAL HIP ARTHROPLASTY ANTERIOR APPROACH;  Surgeon: [name], MD;  Location: [facility];  Service: Orthopedics;  Laterality: Left;   Patient Active Problem List   Diagnosis Date Noted   Allergic conjunctivitis of both eyes 07/06/2020   Gait abnormality 11/18/2019   OSA on CPAP 06/04/2019   Pain in joint of left shoulder 03/28/2019   Seasonal allergic conjunctivitis 02/07/2019   Chronic intermittent hypoxia with obstructive sleep apnea 01/25/2019   Severe obstructive sleep apnea 01/25/2019   Excessive daytime sleepiness 01/03/2019   Non-restorative sleep 01/03/2019   Nocturia more than twice per night 01/03/2019   Weight gain, abnormal 01/03/2019   Snorings 01/03/2019   Trochanteric bursitis of right hip 04/27/2018   Encounter for routine screening for malformation using ultrasonics 02/08/2018   Abnormal weight gain 02/08/2018   Acid indigestion 02/08/2018   Acute  antritis 02/08/2018   Class 2 severe obesity with serious comorbidity and body mass index (BMI) of 39.0 to 39.9 in adult ([place]) 02/08/2018   Perennial allergic rhinitis 02/08/2018   Antiphospholipid syndrome ([place]) 02/08/2018   Arthralgia of hip or thigh 02/08/2018   Asthma, cough variant 02/08/2018   Vitamin D deficiency 02/08/2018   Backhand tennis elbow 02/08/2018   Cannot sleep 02/08/2018   Chronic kidney disease (CKD), stage III (moderate) ([place]) 02/08/2018   Current drug use 02/08/2018   Depression, major, single episode, in partial remission ([place]) 02/08/2018   Diabetes mellitus with polyneuropathy ([place]) 02/08/2018   Difficulty hearing 02/08/2018   Disturbance of skin sensation 02/08/2018   Hypertension associated with diabetes ([place]) 02/08/2018   Extremity pain 02/08/2018   Facet syndrome, lumbar  02/08/2018   Factor V Leiden ([place]) 02/08/2018   Fungal infection of nail 02/08/2018   Gastroenteritis and colitis, viral 02/08/2018   Hypoglycemia 02/08/2018   Menopause 02/08/2018   Other long term (current) drug therapy 02/08/2018   Other osteonecrosis, unspecified femur ([place]) 02/08/2018   Pure hypercholesterolemia 02/08/2018   Recurrent major depressive episodes ([place]) 02/08/2018   Syncope and collapse 02/08/2018   Not well controlled moderate persistent asthma 02/08/2018   Cough, persistent 02/08/2018   Restless leg syndrome 01/15/2018   Degeneration of lumbar intervertebral disc 11/21/2017   Tremor of right hand 03/02/2017   Parkinson disease 03/02/2017   UTI (urinary tract infection) 12/01/2015   Type 2 diabetes mellitus with stage 3b chronic kidney disease, with long-term current use of insulin ([place]) 06/03/2015   Fast heart beat 04/09/2015   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 2 diabetes mellitus ([place]) 04/24/2014   Postoperative anemia due to acute blood loss 10/10/2013   Avascular necrosis of bone of left hip ([place]) 10/09/2013   Arthritis of pelvic region, degenerative 10/09/2013   Breath shortness 06/18/2013   Diabetes mellitus type 2, uncontrolled 05/31/2013   Intermittent claudication ([place]) 04/04/2013   Anxiety    Gastroesophageal reflux disease 09/12/2012   Clinical depression 02/21/2012   Stroke ([place]) 02/11/2012   History of revision of total replacement of right hip joint 04/04/2009    ONSET DATE: 12/15/21  REFERRING DIAG: Parkinsonism  THERAPY DIAG:  Other symptoms and signs involving the nervous system - Plan: Ot plan of care cert/re-cert  Attention and concentration deficit - Plan: Ot plan of care cert/re-cert  Muscle weakness (generalized) - Plan: Ot plan of care cert/re-cert  Other lack of coordination - Plan: Ot plan of care cert/re-cert  Other abnormalities of gait and mobility - Plan: Ot plan of care cert/re-cert  Abnormal posture - Plan: Ot plan of care  cert/re-cert  Frontal lobe and executive function deficit - Plan: Ot plan of care cert/re-cert  Unsteadiness on feet - Plan: Ot plan of care cert/re-cert  Rationale for Evaluation and Treatment Rehabilitation  SUBJECTIVE:   SUBJECTIVE STATEMENT: Denies pain Pt accompanied by: self, husband  PERTINENT HISTORY: PERTINENT HISTORY: Parkinsonism (2018),TIA, thyroid disease, kidney disease, HTN, HLD, GERD, DMII, depression, L hip avascular necrosis, anxiety, anemia, R THA 2011, L THA 2015, knee arthroscopy   PRECAUTIONS: Fall  WEIGHT BEARING RESTRICTIONS No  PAIN:  Are you having pain? No (only soreness from recent fall)   FALLS: Has patient fallen in last 6 months? Yes. Number of falls 5  LIVING ENVIRONMENT: Lives with: lives with their spouse Lives in: House/apartment  PLOF: Independent  PATIENT GOALS to be able style hair  OBJECTIVE: from eval   HAND DOMINANCE: Right  FUNCTIONAL OUTCOME MEASURES: Fastening/unfastening 3 buttons: 50.68 Physical performance test: PPT#2 (simulated eating) 15.03 & PPT#4 (donning/doffing jacket): 13.34  COORDINATION: 9 Hole Peg test: Right: 36.91 sec; Left: 38.16 sec Box and Blocks:  Right 33blocks, Left 37blocks   TODAY'S TREATMENT:  Supine PWR! Moves 10 reps each, min-mod v.c for amplitude and posture. Then pt practiced bed mobility incorporating the supine PWR! moves principles into bed mobility, min-mod v.c then pt returned demonstration. Pt's husband was present.  Therapist checked progress towards goals in prep for renewal. See goals.        PATIENT EDUCATION: Education details: PWR! moves basic 4 in supine, min-mod v.c and how to coordinate with bed mobility Person educated: Patient and Spouse Education method: Explanation, Demonstration, and Verbal cues Education comprehension: verbalized understanding, returned demonstration, and verbal cues required    [place]: 12/27/21: PWR! Seated, coordination HEP,  writing strategies 12/30/21: PWR! Hands  02/03/22- PWR! Supine basic 4 02/15/22- recommendation for staggered feet and use of rollator for laundry, education regarding pt visual deficits, inability to track inferiorly. 03/08/22: Bag ex's for ADLS   ASSESSMENT:  CLINICAL IMPRESSION:  Pt has made good progress. She has met 4/5 short term goals and 1/4 long term goals. Pt can benefit from continued skilled occupational therapy to address the following deficits: decreased coordination, decreased balance, decreased functional mobility, cognitive and visual perceptual deficits in order to maximize pt's safety and I with daily activities. PERFORMANCE DEFICITS in functional skills including ADLs, IADLs, coordination, dexterity, proprioception, sensation, tone, ROM, strength, pain, flexibility, FMC, GMC, mobility, balance, endurance, decreased knowledge of precautions, decreased knowledge of use of DME, and UE functional use, bradykinesia , cognitive skills including attention, energy/drive, learn, memory, problem solving, safety awareness, thought, and understand, and psychosocial skills including coping strategies, environmental adaptation, habits, interpersonal interactions, and routines and behaviors.   IMPAIRMENTS are limiting patient from ADLs, IADLs, play, leisure, and social participation.   COMORBIDITIES may have co-morbidities  that affects occupational performance. Patient will benefit from skilled OT to address above impairments and improve overall function.  MODIFICATION OR ASSISTANCE TO COMPLETE EVALUATION: No modification of tasks or assist necessary to complete an evaluation.  OT OCCUPATIONAL PROFILE AND HISTORY: Detailed assessment: Review of records and additional review of physical, cognitive, psychosocial history related to current functional performance.  CLINICAL DECISION MAKING: LOW - limited treatment options, no task modification necessary  REHAB POTENTIAL: Good  EVALUATION  COMPLEXITY: Low     GOALS: Goals reviewed with patient? No  SHORT TERM GOALS: Target date: 02/21/22- extended due to scheduling  I with PD specific HEP Baseline: Goal status: MET (w/ caregiver cues)   2.  Pt will verbalize understanding of adapted strategies to maximize safety and I with ADLs/ IADLs .  Baseline:  Goal status: MET  3.  Pt will demonstrate improved bilateral UE functional use as evidenced by increasing box/ blocks score by 3 blocks Baseline: RUE 33, LUE 37 Goal status: MET (Rt = 38, Lt = 40)   4.  Pt will demonstrate improved ease with feeding as evidenced by decreasing PPT#2 (self feeding) by 3 secs Baseline: 15.03 Goal status: MET (9.06 sec)   5.  Pt will report increased ease with styling her hair Baseline: Pt reports difficulty Goal status: ongoing - pt reports she has stopped doing - therapist encouraged her to continue to do (seated)     LONG TERM GOALS: Target date: 03/17/22-  1.Pt will verbalize understanding of ways to prevent future PD related complications  and PD community resources.  Baseline:  Goal status:ongoing  2.  Pt will demonstrate improved fine motor coordination for ADLs as evidenced by decreasing bilateral  9 hole peg test score for by 3 secs  Baseline: RUE 36.91, LUE 38.16 Goal status: ongoing for RUE 45.53, several drops, LUE 34.78- met for LUE only  3.  Pt will demonstrate understanding of memory compensations and ways to keep thinking skills sharp  Baseline:  Goal status: ongoing 4.  Pt will demonstrate improved ease with fastening buttons as evidenced by decreasing 3 button/ unbutton time to 47 secs or less  Baseline: 50.68 Goal status:  met 42.66   PLAN: OT FREQUENCY: 2x/week  OT DURATION: 4 weeks  PLANNED INTERVENTIONS: self care/ADL training, therapeutic exercise, therapeutic activity, neuromuscular re-education, manual therapy, passive range of motion, balance training, stair training, functional mobility  training, aquatic therapy, ultrasound, paraffin, fluidotherapy, moist heat, cryotherapy, contrast bath, patient/family education, cognitive remediation/compensation, visual/perceptual remediation/compensation, energy conservation, coping strategies training, DME and/or AE instructions, and Re-evaluation  RECOMMENDED OTHER SERVICES: PT  CONSULTED AND AGREED WITH PLAN OF CARE: Patient  PLAN FOR NEXT SESSION: renew for several more visits, memory compensations, ways to prevent future complications, PD education flow sheet, ADL strategies,   Theone Murdoch, OTR/L [phone] Phone: [phone] 2:39 PM 03/15/22  03/15/22 2:39 PM Phone [phone] FAX [phone]

## 2022-03-15 NOTE — Therapy (Signed)
OUTPATIENT PHYSICAL THERAPY NEURO TREATMENT/RE-CERT   Patient Name: Deanna Rowe MRN: [id number] DOB:[birth year], 66 y.o., female Today's Date: 03/15/2022   PCP: Aretta Nip, MD REFERRING PROVIDER: Ward Givens, NP    PT End of Session - 03/15/22 [phone]     Visit Number 15    Number of Visits 23    Date for PT Re-Evaluation 05/14/22   due to delay in scheduling/pt going on vacation   Authorization Type [facility]    PT Start Time 6063    PT Stop Time 1015    PT Time Calculation (min) 40 min    Equipment Utilized During Treatment Gait belt    Activity Tolerance Patient tolerated treatment well    Behavior During Therapy [facility] for tasks assessed/performed                      Past Medical History:  Diagnosis Date   Anemia    Anxiety    Arthritis    L HIP   Asthma    Avascular necrosis of hip ([place])    LEFT   Back pain    Chest pain    Constipation    Depression    Diabetes mellitus    Diabetes mellitus, type II (HCC)    Dry cough    Edema, lower extremity    Gait abnormality 11/18/2019   GERD (gastroesophageal reflux disease)    High cholesterol    Hypertension    Insomnia    takes Ambien nightly   Joint pain    Kidney disease    Kidney disease    Obesity    Parkinsonism 03/02/2017   PONV (postoperative nausea and vomiting)    RLS (restless legs syndrome) 01/15/2018   Sleep apnea    Swallowing difficulty    Thyroid disease    TIA (transient ischemic attack) 2012   no residual problems   Urgency incontinence    Vitamin D deficiency    Past Surgical History:  Procedure Laterality Date   DILATION AND CURETTAGE OF UTERUS     ESOPHAGEAL MANOMETRY N/A 09/03/2012   Procedure: ESOPHAGEAL MANOMETRY (EM);  Surgeon: Garlan Fair, MD;  Location: [facility];  Service: Endoscopy;  Laterality: N/A;   ESOPHAGEAL MANOMETRY N/A 06/19/2017   Procedure: ESOPHAGEAL MANOMETRY (EM);  Surgeon: Clarene Essex, MD;  Location: [facility];  Service:  Endoscopy;  Laterality: N/A;   EXPLORATORY LAPAROTOMY     GANGLION CYST EXCISION     JOINT REPLACEMENT  2011   rt total hip   KNEE ARTHROSCOPY     PILONIDAL CYST / SINUS EXCISION     TOTAL HIP ARTHROPLASTY Left 10/09/2013   Procedure: LEFT TOTAL HIP ARTHROPLASTY ANTERIOR APPROACH;  Surgeon: [name], MD;  Location: [facility];  Service: Orthopedics;  Laterality: Left;   Patient Active Problem List   Diagnosis Date Noted   Allergic conjunctivitis of both eyes 07/06/2020   Gait abnormality 11/18/2019   OSA on CPAP 06/04/2019   Pain in joint of left shoulder 03/28/2019   Seasonal allergic conjunctivitis 02/07/2019   Chronic intermittent hypoxia with obstructive sleep apnea 01/25/2019   Severe obstructive sleep apnea 01/25/2019   Excessive daytime sleepiness 01/03/2019   Non-restorative sleep 01/03/2019   Nocturia more than twice per night 01/03/2019   Weight gain, abnormal 01/03/2019   Snorings 01/03/2019   Trochanteric bursitis of right hip 04/27/2018   Encounter for routine screening for malformation using ultrasonics 02/08/2018   Abnormal weight gain  02/08/2018   Acid indigestion 02/08/2018   Acute antritis 02/08/2018   Class 2 severe obesity with serious comorbidity and body mass index (BMI) of 39.0 to 39.9 in adult ([place]) 02/08/2018   Perennial allergic rhinitis 02/08/2018   Antiphospholipid syndrome ([place]) 02/08/2018   Arthralgia of hip or thigh 02/08/2018   Asthma, cough variant 02/08/2018   Vitamin D deficiency 02/08/2018   Backhand tennis elbow 02/08/2018   Cannot sleep 02/08/2018   Chronic kidney disease (CKD), stage III (moderate) ([place]) 02/08/2018   Current drug use 02/08/2018   Depression, major, single episode, in partial remission ([place]) 02/08/2018   Diabetes mellitus with polyneuropathy ([place]) 02/08/2018   Difficulty hearing 02/08/2018   Disturbance of skin sensation 02/08/2018   Hypertension associated with diabetes ([place]) 02/08/2018   Extremity pain 02/08/2018    Facet syndrome, lumbar 02/08/2018   Factor V Leiden ([place]) 02/08/2018   Fungal infection of nail 02/08/2018   Gastroenteritis and colitis, viral 02/08/2018   Hypoglycemia 02/08/2018   Menopause 02/08/2018   Other long term (current) drug therapy 02/08/2018   Other osteonecrosis, unspecified femur ([place]) 02/08/2018   Pure hypercholesterolemia 02/08/2018   Recurrent major depressive episodes ([place]) 02/08/2018   Syncope and collapse 02/08/2018   Not well controlled moderate persistent asthma 02/08/2018   Cough, persistent 02/08/2018   Restless leg syndrome 01/15/2018   Degeneration of lumbar intervertebral disc 11/21/2017   Tremor of right hand 03/02/2017   Parkinson disease 03/02/2017   UTI (urinary tract infection) 12/01/2015   Type 2 diabetes mellitus with stage 3b chronic kidney disease, with long-term current use of insulin ([place]) 06/03/2015   Fast heart beat 04/09/2015   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 2 diabetes mellitus ([place]) 04/24/2014   Postoperative anemia due to acute blood loss 10/10/2013   Avascular necrosis of bone of left hip ([place]) 10/09/2013   Arthritis of pelvic region, degenerative 10/09/2013   Breath shortness 06/18/2013   Diabetes mellitus type 2, uncontrolled 05/31/2013   Intermittent claudication ([place]) 04/04/2013   Anxiety    Gastroesophageal reflux disease 09/12/2012   Clinical depression 02/21/2012   Stroke ([place]) 02/11/2012   History of revision of total replacement of right hip joint 04/04/2009    ONSET DATE: 12/02/2021   REFERRING DIAG: G20 (ICD-10-CM) - Parkinson disease ([place]) R29.6 (ICD-10-CM) - Falls frequently   THERAPY DIAG:  Other symptoms and signs involving the nervous system  Muscle weakness (generalized)  Other lack of coordination  Other abnormalities of gait and mobility  Abnormal posture  Rationale for Evaluation and Treatment Rehabilitation  SUBJECTIVE:  SUBJECTIVE STATEMENT: Pt presents with her husband, Simona Huh. Has been using her rollator at all times. No new falls. Reports exercises are going good. Per husband, wants pt to work on stairs. Pt's husband also reports that pt has been having difficulty getting out of the recliner.   Pt accompanied by: self  PERTINENT HISTORY: Parkinsonism (2018),TIA, thyroid disease, kidney disease, HTN, HLD, GERD, DMII, depression, L hip avascular necrosis, anxiety, anemia, R THA 2011, L THA 2015, knee arthroscopy   PAIN:  Are you having pain? No   PRECAUTIONS: Fall, No driving.   FALLS: Has patient fallen in last 6 months? Yes. Number of falls 10  VITALS There were no vitals filed for this visit.    PLOF: Independent with community mobility with device  PATIENT GOALS Wants to stop falling, improve balance.    OBJECTIVE:   COGNITION: Overall cognitive status: Impaired  TODAY'S TREATMENT:  Ther Act - Goal Assessment  5x sit <> stand with no UE support: 14.2 seconds from standard height chair, 18.5 seconds with no UE support from lower chair  Gait speed: 15.8 seconds with rollator = 2.06 ft/sec with rollator    [facility] PT Assessment - 03/15/22 1002       Mini-BESTest   Sit To Stand Normal: Comes to stand without use of hands and stabilizes independently.    Rise to Toes Normal: Stable for 3 s with maximum height.    Stand on one leg (left) Moderate: < 20 s   6.9 seconds, 4.3 seconds   Stand on one leg (right) Moderate: < 20 s   1.3 seconds, 5 seconds   Stand on one leg - lowest score 1    Compensatory Stepping Correction - Forward Normal: Recovers independently with a single, large step (second realignement is allowed).    Compensatory Stepping Correction - Backward Normal: Recovers independently with a single, large step    Compensatory Stepping Correction - Left Lateral Moderate: Several  steps to recover equilibrium   2-3 steps   Compensatory Stepping Correction - Right Lateral Normal: Recovers independently with 1 step (crossover or lateral OK)    Stepping Corredtion Lateral - lowest score 1    Stance - Feet together, eyes open, firm surface  Normal: 30s    Stance - Feet together, eyes closed, foam surface  Moderate: < 30s   9 seconds   Incline - Eyes Closed Normal: Stands independently 30s and aligns with gravity            Unable to finish miniBEST due to time constraints.    STAIRS:  Level of Assistance: SBA and CGA  Stair Negotiation Technique: Step to Pattern Forwards With use of AD: SPC  with Single Rail on Right  Number of Stairs: 4 x3 reps = 12 total   Height of Stairs: 6  Comments: Went over proper technique with stairs for home at pt reports having difficulty with this. Pt having railing on R side and holding cane in other hand. Cues to put cane on step first and then step up with RLE (stronger leg) as pt initially stepping up with cane and left foot at the same time and almost tripping over it. When descending cues to put cane first and then step down with weaker LLE first (pt initially stepping down with RLE first). Cues to also bring hand down stairs when descending. Pt feeling more steady performing this way and able to perform with supervision.     GAIT: Gait pattern:  genu valgus  of RLE, step through pattern, decreased stride length, Right foot flat, Left foot flat, decreased trunk rotation, trunk flexed, and narrow BOS Distance walked: Clinic distances. Assistive device utilized: Environmental consultant - 4 wheeled Level of assistance: SBA Comments: Cues to stay inside the rollator at all times. Initial cues when pt ambulating into session to stay inside walker and turn inside walker and back up to mat table and lock brakes before sitting down. Pt initially just trying to push walker away and then walk to mat table unsupported.    PATIENT EDUCATION: Education  details: Results of LTGs, areas to work on going forwards in therapy. Reviewed safety techniques - stair training, and using the reacher to grab objects from the ground vs. Bending over. Discussed return PD evals vs. Screens in 6 months, pt would benefit more from evals. Reviewed PD resources (pt's husband reports that they have handout at home).  Person educated: Patient and Spouse Education method: Explanation and Demonstration Education comprehension: verbalized understanding and returned demonstration   HOME EXERCISE PROGRAM: Access Code: [email or web address] URL: [email or web address] Date: 12/22/2021 Prepared by: Mickie Bail Plaster  Exercises - Sit to Stand Without Arm Support  - 1 x daily - 7 x weekly - 2 sets - 5-6 reps - Heel Raises with Counter Support  - 1 x daily - 7 x weekly - 3 sets - 10 reps - Step sideways with arms reaching at counter   - 1 x daily - 7 x weekly - 3 sets - 10 reps - Alternating Step Backward with Support  - 1 x daily - 7 x weekly - 3 sets - 10 reps - Standing Quarter Turn with Counter Support  - 1 x daily - 7 x weekly - 3 sets - 10 reps - Seated Hamstring Stretch  - 1 x daily - 7 x weekly - 1 sets - 3-4 reps - 30 second hold - Calf stretch  - 1 x daily - 7 x weekly - 1 sets - 3-4 reps - 30-45 second hold   Standing PWR moves    GOALS: Goals reviewed with patient? Yes   LONG TERM GOALS: Target date: 03/15/22 (UPDATED DATE)  Pt will be independent with final HEP for PD specific deficits/balance in order to build upon functional gains made in therapy. Baseline: pt performing at home, will benefit from updates/additions  Goal status: ON-GOING  2.  Pt will improve MiniBest to 22/28 for decreased fall risk and improvement with compensatory stepping strategies.   Baseline: 16/28; 18/28 on 02/10/22 - did not finish on 03/15/22 due to time constraints  Goal status: DEFERRED   3.  Pt will improve gait speed with LRAD to at least 2.6 ft/sec in order to demo  improved community mobility.  Baseline: 16.58 seconds = 1.95 ft/sec with SPC; 2.5 ft/s w/ SPC on 10/10   14.2 seconds with SPC = 2.31 ft/sec 15.8 seconds with rollator = 2.06 ft/sec  Goal status: NOT MET  4.  Pt will improve 5x sit<>stand to less than or equal to 14 sec with no UE support to demonstrate improved functional strength and transfer efficiency.  Baseline: 28.59 seconds with no UE support; 13.21s on 10/10,   16 seconds on 02/10/22; 14.2 seconds on 03/15/22  Goal status: MET  5.  Pt will verbalize understanding of local Parkinson's disease resources, including options for continued community fitness. Baseline: pt's husband reports they are looking into getting into a class at [facility], reports they have a handout.  Goal status: MET  UPDATED/ONGOING LTGS FOR RE-CERT LONG TERM GOALS: Target date: 04/01/22 (UPDATED DATE)  Pt will be independent with final HEP for PD specific deficits/balance in order to build upon functional gains made in therapy. Baseline: pt performing at home, will benefit from updates/additions  Goal status: ON-GOING  2.  Pt perform L lateral stepping strategy in 1-2 steps in order to demo improved balance recovery.  Baseline: 2-3 steps on 03/15/22 Goal status: NEW  3.  Pt will improve gait speed with rollator to at least 2.4 ft/sec in order to demo improved community mobility.  Baseline: 15.8 seconds with rollator = 2.06 ft/sec, pt only using cane now when going up and down the stairs  Goal status: NEW  4.  Pt will improve 5x sit<>stand to less than or equal to 16 sec with no UE support from lower surfaces to demonstrate improved functional strength and transfer efficiency.  Baseline: 18.5 seconds with no UE support from lower chair  Goal status: NEW  5.  Pt will perform at least 12 steps with use of cane with proper sequencing with mod I in order to demo improved safety with stairs at home.  Baseline: supervision/CGA Goal status:  NEW  ASSESSMENT:  CLINICAL IMPRESSION: Today's skilled session focused on assessing pt's LTGs. Pt met LTG #4 and #5. Pt improved 5x sit <> stand with no UE support to 14.2 seconds (previously was 16 seconds). Pt still with difficulty getting up from lower surfaces, such as the recliner. Pt did not meet LTG #3, pt's gait speed with rollator was 2.06 ft/sec, indicating a limited community ambulator. Unable to finish miniBEST today due to time constraints, however, pt with improvements with stepping strategies esp in the posterior and L lateral direction. Worked on stair training today per pt and husband request, with cues needed for proper sequencing and being deliberate about picking up foot. Pt improved with incr practice and able to perform with supervision. Pt will continue to benefit from skilled PT to address gait, transfers, safety with functional mobility, balance, functional strengthening in order to improve mobility and decr fall risk. LTGs updated/revised as appropriate. Pt's husband present during sessions recently for improved carryover at home.   OBJECTIVE IMPAIRMENTS Abnormal gait, decreased activity tolerance, decreased balance, decreased coordination, difficulty walking, decreased strength, impaired flexibility, impaired tone, and postural dysfunction.   ACTIVITY LIMITATIONS bending, squatting, stairs, transfers, and locomotion level  PARTICIPATION LIMITATIONS: driving and community activity  PERSONAL FACTORS Behavior pattern, Past/current experiences, Time since onset of injury/illness/exacerbation, and 3+ comorbidities: Parkinsonism (2018),TIA, thyroid disease, kidney disease, HTN, HLD, GERD, DMII, depression, L hip avascular necrosis, anxiety, anemia, R THA 2011, L THA 2015, knee arthroscopy   are also affecting patient's functional outcome.   REHAB POTENTIAL: Good  CLINICAL DECISION MAKING: Evolving/moderate complexity  EVALUATION COMPLEXITY: Moderate  PLAN: PT FREQUENCY:  2x/week  PT DURATION: 8 weeks  PLANNED INTERVENTIONS: Therapeutic exercises, Therapeutic activity, Neuromuscular re-education, Balance training, Gait training, Self Care, Stair training, Vestibular training, DME instructions, Manual therapy, and Re-evaluation  PLAN FOR NEXT SESSION:  Continue with stepping strategies (posterior and L lateral), wider BOS and staggered stance w/reaching tasks. Sit to stands from lower surfaces (like getting up from recliner)functional strength. Work on IT trainer. Scifit for endurance,forward lunge over beam, reaching out of BOS, resisted walking. Review standing PWR moves and see if they can be modified for safety and with husband supervision (they have not been doing these), otherwise pt might benefit from modified quadruped PWR moves at countertop.  Arliss Journey, PT, DPT  03/15/2022, 10:20 AM

## 2022-03-15 NOTE — Patient Instructions (Signed)
Coordination Exercises   Perform the following exercises for 15 minutes 1-2 times per day. Perform with both hand(s). Perform using big movements.   Flipping Cards: Place deck of cards on the table. Flip cards over by opening your hand big to grasp and then turn your palm up big. Hold deck of cards in hand and push top card off with thumb. Do one big push! Rotate ball with fingertips: Pick up with fingers/thumb and move as much as you can with each turn/movement (clockwise and counter-clockwise). Toss ball in the air and catch with the same hand: Toss big/high. Pick up coins and stack one at a time: Pick up with big, intentional movements. Do not drag coin to the edge. (5-10 in a stack) Practice writing: Slow down, write big, and focus on forming each letter. Perform "Flicks"/hand stretches (PWR! Hands): Close hands then flick out your fingers with focus on opening hands, pulling wrists back, and extending elbows like you are pushing.

## 2022-03-17 ENCOUNTER — Ambulatory Visit: Payer: 59 | Admitting: Physical Therapy

## 2022-03-17 ENCOUNTER — Ambulatory Visit: Payer: 59 | Admitting: Occupational Therapy

## 2022-03-17 DIAGNOSIS — H524 Presbyopia: Secondary | ICD-10-CM | POA: Diagnosis not present

## 2022-03-21 ENCOUNTER — Encounter: Payer: Self-pay | Admitting: Internal Medicine

## 2022-03-21 ENCOUNTER — Ambulatory Visit (INDEPENDENT_AMBULATORY_CARE_PROVIDER_SITE_OTHER): Payer: 59 | Admitting: Internal Medicine

## 2022-03-21 VITALS — BP 110/72 | HR 64 | Temp 97.5°F | Resp 18

## 2022-03-21 DIAGNOSIS — K219 Gastro-esophageal reflux disease without esophagitis: Secondary | ICD-10-CM

## 2022-03-21 DIAGNOSIS — K220 Achalasia of cardia: Secondary | ICD-10-CM | POA: Diagnosis not present

## 2022-03-21 DIAGNOSIS — H1010 Acute atopic conjunctivitis, unspecified eye: Secondary | ICD-10-CM

## 2022-03-21 DIAGNOSIS — H1013 Acute atopic conjunctivitis, bilateral: Secondary | ICD-10-CM | POA: Diagnosis not present

## 2022-03-21 DIAGNOSIS — J4540 Moderate persistent asthma, uncomplicated: Secondary | ICD-10-CM

## 2022-03-21 DIAGNOSIS — J3089 Other allergic rhinitis: Secondary | ICD-10-CM | POA: Diagnosis not present

## 2022-03-21 NOTE — Progress Notes (Signed)
Chief Complaint:   OBESITY Deanna Rowe is here to discuss her progress with her obesity treatment plan along with follow-up of her obesity related diagnoses. Deanna Rowe is on the Category 3 Plan and states she is following her eating plan approximately (unknown)% of the time. Deanna Rowe states she is walking and doing chair exercise for 30 minutes 2 times per week.  Today's visit was #: 57 Starting weight: 237 lbs Starting date: 04/16/2019 Today's weight: 210 lbs Today's date: 03/08/2022 Total lbs lost to date: 27 Total lbs lost since last in-office visit: 0  Interim History: Deanna Rowe has been off track recently with her weight loss efforts, but she has still remained mindful of her food choices. She is working on getting back on a structured eating plan.   Subjective:   1. Type 2 diabetes mellitus with stage 3b chronic kidney disease, with long-term current use of insulin (HCC) Deanna Rowe has had low glucose levels as loa as 40 in the middle of the night.   Assessment/Plan:   1. Type 2 diabetes mellitus with stage 3b chronic kidney disease, with long-term current use of insulin (Deanna Rowe) I discussed the dangers of hypoglycemia with the patient. Deanna Rowe was encouraged to have glucose tablets in her nightstand and in her purse to help treat hypoglycemia. She is to make sure to not skip meals in the meanwhile hypoglycemia symptoms and treatment handout was given.    2. Obesity, Current BMI 33.9 Deanna Rowe is currently in the action stage of change. As such, her goal is to continue with weight loss efforts. She has agreed to change to the Category 2 Plan.   Exercise goals: As is.   Behavioral modification strategies: increasing lean protein intake, no skipping meals, and holiday eating strategies .  Deanna Rowe has agreed to follow-up with our clinic in 4 weeks. She was informed of the importance of frequent follow-up visits to maximize her success with intensive lifestyle modifications for her multiple  health conditions.   Objective:   Blood pressure 116/75, pulse 71, temperature 97.7 F (36.5 C), height '5\' 6"'$  (1.676 m), weight 210 lb (95.3 kg), SpO2 98 %. Body mass index is 33.89 kg/m.  General: Cooperative, alert, well developed, in no acute distress. HEENT: Conjunctivae and lids unremarkable. Cardiovascular: Regular rhythm.  Lungs: Normal work of breathing. Neurologic: No focal deficits.   Lab Results  Component Value Date   CREATININE 1.11 (H) 01/08/2022   BUN 15 01/08/2022   NA 132 (L) 01/08/2022   K 4.0 01/08/2022   CL 99 01/08/2022   CO2 26 01/08/2022   Lab Results  Component Value Date   ALT 21 04/16/2019   AST 23 04/16/2019   ALKPHOS 58 04/16/2019   BILITOT 0.3 04/16/2019   Lab Results  Component Value Date   HGBA1C 7.9 (H) 04/16/2019   HGBA1C 6.9 11/25/2016   HGBA1C 6.0 06/10/2016   HGBA1C 5.9 02/12/2016   HGBA1C 6.1 12/01/2015   Lab Results  Component Value Date   INSULIN 12.3 04/16/2019   Lab Results  Component Value Date   TSH 1.780 04/16/2019   Lab Results  Component Value Date   CHOL 155 04/16/2019   HDL 84 04/16/2019   LDLCALC 57 04/16/2019   TRIG 71 04/16/2019   Lab Results  Component Value Date   VD25OH 44.8 12/17/2019   VD25OH 40.9 04/16/2019   Lab Results  Component Value Date   WBC 6.6 01/08/2022   HGB 11.9 (L) 01/08/2022   HCT 35.5 (L) 01/08/2022  MCV 99.7 01/08/2022   PLT 230 01/08/2022   No results found for: "IRON", "TIBC", "FERRITIN"  Attestation Statements:   Reviewed by clinician on day of visit: allergies, medications, problem list, medical history, surgical history, family history, social history, and previous encounter notes.  Time spent on visit including pre-visit chart review and post-visit care and charting was 30 minutes.   I, Trixie Dredge, am acting as transcriptionist for Dennard Nip, MD.  I have reviewed the above documentation for accuracy and completeness, and I agree with the above. -  Dennard Nip, MD

## 2022-03-21 NOTE — Patient Instructions (Addendum)
Asthma Breathing test looked good! Continue Symbicort 160/4.5 mcg to-2 puffs twice a day with a spacer to prevent cough or wheeze.   Continue albuterol 2 puffs every 4 hours as needed for cough, wheeze, tightness in chest, or shortness of breath Continue Xolair injections 300 mg once every 4 weeks and have access to an epinephrine auto-injector set.    Allergic rhinitis Continue Nasacort 2 sprays in each nostril once a day as needed for stuffy nose Continue saline nasal rinses as needed for nasal symptoms. Use this before any medicated nasal sprays for best result Continue azelastine nasal spray 2 sprays in each nostril twice a day as needed for drainage down throat/runny nose May use Xyzal 5 mg once a day only as needed for a runny nose or itch Continue allergen avoidance measures directed toward dust mites, cat, and dog as listed below For thick post nasal drainage, begin Mucinex [id number] mg twice a day and increase your fluid intake in order to thin out mucus  Allergic conjunctivitis Some over the counter eye drops include Pataday one drop in each eye once a day as needed for red, itchy eyes OR Zaditor one drop in each eye twice a day as needed for red itchy eyes  Reflux Continue omeprazole daily  Continue dietary and lifestyle modifications  Follow up with GI for achalasia   Please let us know if this treatment plan is not working well for you  Follow up in 3 months   Merry Christmas!!   Thank you so much for letting me partake in your care today.  Don't hesitate to reach out if you have any additional concerns!  Roney [name], MD  [facility] [place], [place]

## 2022-03-21 NOTE — Progress Notes (Signed)
Follow Up Note  RE: Deanna Rowe MRN: [id number] DOB: December 24, 1955 Date of Office Visit: 03/21/2022  Referring provider: Aretta Nip, MD Primary care provider: Aretta Nip, MD  Chief Complaint: Asthma  History of Present Illness: I had the pleasure of seeing Deanna Rowe for a follow up visit at the [facility] on 03/21/2022. She is a 66 y.o. female, who is being followed for persistent asthma, allergic rhinitis, reflux allergic conjunctivitis . Her previous allergy office visit was on 01/18/22 with Althea Charon FNP. Today is a regular follow up visit.  History obtained from patient, chart review.  At last visit famotidine was discontinued and she was started on omeprazole for GERD.  Today they report that overall all of her allergy symptoms and asthma symptoms are improving slowly.  She continues with Symbicort 160 mcg 2 puffs twice daily.  She is washing her mouth out after use.  She is on Xolair 300 mg injections every 4 weeks.  Denies any reactions or adverse effects of medications or Biologics.  She is requiring her albuterol a few times a week.  She not having any nocturnal awakenings.  Denies any oral corticosteroids or antibiotics since last visit.  She is due for spirometry.  In regards to rhinitis symptoms she is well-controlled on Nasacort 2 sprays in each nostril once a day, Xyzal 5 mg once a day, azelastine nasal spray 2 sprays per nostril twice a day.  Reflux is better since starting omeprazole.  She has achalasia and will cough at night due to that.She follows with GI for this.    Assessment and Plan: Deanna Rowe is a 66 y.o. female with: Moderate persistent asthma without complication - Plan: Spirometry with Graph  Perennial allergic rhinitis  Seasonal allergic conjunctivitis  Gastroesophageal reflux disease without esophagitis  Achalasia Plan: Patient Instructions  Asthma Breathing test looked good! Continue Symbicort  160/4.5 mcg to-2 puffs twice a day with a spacer to prevent cough or wheeze.   Continue albuterol 2 puffs every 4 hours as needed for cough, wheeze, tightness in chest, or shortness of breath Continue Xolair injections 300 mg once every 4 weeks and have access to an epinephrine auto-injector set.    Allergic rhinitis Continue Nasacort 2 sprays in each nostril once a day as needed for stuffy nose Continue saline nasal rinses as needed for nasal symptoms. Use this before any medicated nasal sprays for best result Continue azelastine nasal spray 2 sprays in each nostril twice a day as needed for drainage down throat/runny nose May use Xyzal 5 mg once a day only as needed for a runny nose or itch Continue allergen avoidance measures directed toward dust mites, cat, and dog as listed below For thick post nasal drainage, begin Mucinex [phone] mg twice a day and increase your fluid intake in order to thin out mucus  Allergic conjunctivitis Some over the counter eye drops include Pataday one drop in each eye once a day as needed for red, itchy eyes OR Zaditor one drop in each eye twice a day as needed for red itchy eyes  Reflux Continue omeprazole daily  Continue dietary and lifestyle modifications  Follow up with GI for achalasia   Please let us know if this treatment plan is not working well for you  Follow up in 3 months   Merry Christmas!!   Thank you so much for letting me partake in your care today.  Don't hesitate to reach out if you have  any additional concerns!  Roney [name], MD  [facility] [place] [place]    No follow-ups on file.  No orders of the defined types were placed in this encounter.   Lab Orders  No laboratory test(s) ordered today   Diagnostics: Spirometry:  Tracings reviewed. Her effort: Good reproducible efforts. FVC: 1.88L FEV1: 1.51L, 72% predicted FEV1/FVC ratio: 80% Interpretation: Spirometry consistent with possible restrictive  disease.  (Similar to prior)  Please see scanned spirometry results for details.   Results interpreted by myself during this encounter and discussed with patient/family.   Medication List:  Current Outpatient Medications  Medication Sig Dispense Refill   ACCU-CHEK FASTCLIX LANCETS MISC USE TO CHECK BLOOD SUGAR 3 TIMES A DAY 102 each 0   ACCU-CHEK GUIDE test strip USE TO CHECK BLOOD SUGAR 3 TIMES PER DAY. (Patient taking differently: Use to check blood sugar 4 times per day.) 100 each 2   albuterol (VENTOLIN HFA) 108 (90 Base) MCG/ACT inhaler Inhale 2 puffs into the lungs as needed for wheezing or shortness of breath. 18 g 2   amLODipine (NORVASC) 5 MG tablet Take 1 tablet by mouth once a day 90 tablet 0   aspirin EC 81 MG tablet Take 81 mg by mouth every evening.     atorvastatin (LIPITOR) 10 MG tablet TAKE 1 TABLET BY MOUTH AT BEDTIME 90 tablet 3   azelastine (ASTELIN) 0.1 % nasal spray Place 2 sprays into both nostrils 2 (two) times daily as needed. 30 mL 5   brexpiprazole (REXULTI) 2 MG TABS tablet Take 1 tablet (2 mg total) by mouth in the morning. 90 tablet 1   buPROPion (WELLBUTRIN XL) 150 MG 24 hr tablet Take 3 tablets (450 mg total) by mouth in the morning. 270 tablet 1   Calcium Carbonate-Vitamin D 600-400 MG-UNIT tablet Take 1 tablet by mouth daily.     carbidopa-levodopa (SINEMET CR) 50-200 MG tablet TAKE 1 TABLET BY MOUTH 5 (FIVE) TIMES DAILY. 450 tablet 0   carbidopa-levodopa (SINEMET) 25-250 MG tablet Take 1 tablet by mouth in the morning and 1 tablet at 2pm 180 tablet 1   Continuous Blood Gluc Receiver (DEXCOM G7 RECEIVER) DEVI Use as directed to check blood sugar 1 each 1   Continuous Blood Gluc Sensor (DEXCOM G7 SENSOR) MISC Use to check blood sugar and change sensor every 10 days 9 each 4   COVID-19 mRNA vaccine 2023-2024 (COMIRNATY) syringe Inject into the muscle. 0.3 mL 0   cyclobenzaprine (FLEXERIL) 5 MG tablet Take 1 tablet (5 mg total) by mouth at bedtime. 10 tablet 0    EPINEPHrine (EPIPEN 2-PAK) 0.3 mg/0.3 mL IJ SOAJ injection Inject 0.3 mg into the muscle as needed for anaphylaxis. 2 each 1   gabapentin (NEURONTIN) 300 MG capsule Take 1 capsule (300 mg total) by mouth 3 (three) times daily. 270 capsule 2   glucose blood test strip Use to check blood sugar 4 times daily 400 each 3   HYDROcodone-acetaminophen (NORCO/VICODIN) 5-325 MG tablet Take 1 tablet by mouth every 6 (six) hours as needed for 10 days. 40 tablet 0   influenza vaccine adjuvanted (FLUAD QUADRIVALENT) 0.5 ML injection Inject into the muscle. 0.5 mL 0   influenza vaccine adjuvanted (FLUAD) 0.5 ML injection Inject into the muscle. 0.5 mL 0   insulin degludec (TRESIBA FLEXTOUCH) 100 UNIT/ML FlexTouch Pen INJECT UNDER THE SKIN ONCE DAILY. TRITRATE AS DIRECTED WITH A MAXIMUM DAILY DOSE OF 50 UNITS     insulin lispro (HUMALOG) 100  UNIT/ML injection SQ - 14 units at breakfast, 12 units at lunch, 16 units at supper 10 mL 0   Insulin Pen Needle (TECHLITE PEN NEEDLES) 32G X 6 MM MISC use to Inject insulin into the skin 4 (four) times daily. 400 each 0   LORazepam (ATIVAN) 2 MG tablet Take 1 tablet (2 mg total) by mouth 3 (three) times daily. 270 tablet 1   losartan (COZAAR) 100 MG tablet Take 1 tablet (100 mg total) by mouth daily. 90 tablet 3   melatonin 5 MG TABS Take 5 mg by mouth at bedtime.     meloxicam (MOBIC) 15 MG tablet Take 1 tablet (15 mg total) by mouth daily. 30 tablet 1   mirabegron ER (MYRBETRIQ) 50 MG TB24 tablet Take 1 tablet by mouth everyday 90 tablet 3   Olopatadine HCl (PATADAY) 0.2 % SOLN Place 1 drop into both eyes daily as needed. 2.5 mL 5   omalizumab (XOLAIR) 150 MG/ML prefilled syringe Inject 300 mg into the skin every 28 days. 2 mL 11   omeprazole (PRILOSEC) 20 MG capsule Take 1 capsule (20 mg total) by mouth daily 30 minutes prior to dinner 30 capsule 0   propranolol (INDERAL) 40 MG tablet TAKE 1 TABLET BY MOUTH TWICE DAILY 60 tablet 11   rOPINIRole (REQUIP) 2 MG tablet Take  1 tablet (2 mg total) by mouth 2 (two) times daily. 90 tablet 1   Semaglutide-Weight Management (WEGOVY) 2.4 MG/0.75ML SOAJ Inject 2.4 mg under the skin Once a week 9 mL 4   solifenacin (VESICARE) 10 MG tablet TAKE 1 TABLET BY MOUTH ONCE DAILY 30 tablet 11   triamcinolone (NASACORT) 55 MCG/ACT AERO nasal inhaler Place 2 sprays into the nose daily. 16.9 mL 5   valACYclovir (VALTREX) 500 MG tablet Take 1 tablet by mouth everyday 90 tablet 4   vitamin C (ASCORBIC ACID) 500 MG tablet Take 500 mg by mouth daily.     Vitamin D, Ergocalciferol, 2000 units CAPS Take 1 capsule by mouth daily. 2000 units daily.     vortioxetine HBr (TRINTELLIX) 20 MG TABS tablet Take 1 tablet (20 mg total) by mouth every morning. 90 tablet 1   zolpidem (AMBIEN) 5 MG tablet Take 1 tablet (5 mg total) by mouth at bedtime. 90 tablet 1   levocetirizine (XYZAL) 5 MG tablet Take 1 tablet (5 mg total) by mouth daily as needed. 30 tablet 5   No current facility-administered medications for this visit.   Facility-Administered Medications Ordered in Other Visits  Medication Dose Route Frequency Provider Last Rate Last Admin   tranexamic acid (CYKLOKAPRON) topical -INTRAOP  2,000 mg Topical Once [place], [employer], PA-C       Allergies: Allergies  Allergen Reactions   Fluticasone-Salmeterol Anaphylaxis   Codeine Nausea And Vomiting   Fetzima [Levomilnacipran] Nausea And Vomiting   Nsaids Other (See Comments)    Upset stomach    Trulicity [Dulaglutide] Nausea And Vomiting    Significant and undesirably rapid (40 lbs) weight loss    Xanax [Alprazolam] Other (See Comments)    disoriented   Amoxicillin Rash    Has patient had a PCN reaction causing immediate rash, facial/tongue/throat swelling, SOB or lightheadedness with hypotension: Yes Has patient had a PCN reaction causing severe rash involving mucus membranes or skin necrosis: No Has patient had a PCN reaction that required hospitalization: No Has patient had a PCN  reaction occurring within the last 10 years: No If all of the above answers are "NO", then may  proceed with Cephalosporin use.    Pseudoephedrine Hcl Er Palpitations   I reviewed her past medical history, social history, family history, and environmental history and no significant changes have been reported from her previous visit.  ROS: All others negative except as noted per HPI.   Objective: BP 110/72   Pulse 64   Temp (!) 97.5 F (36.4 C) (Temporal)   Resp 18   SpO2 98%  There is no height or weight on file to calculate BMI. General Appearance:  Alert, cooperative, no distress, appears stated age  Head:  Normocephalic, without obvious abnormality, atraumatic  Eyes:  Conjunctiva clear, EOM's intact  Nose: Nares normal,  erythematous nasal mucosa, no visible anterior polyps, and septum midline  Throat: Lips, tongue normal; teeth and gums normal, normal posterior oropharynx and no tonsillar exudate  Neck: Supple, symmetrical  Lungs:   clear to auscultation bilaterally, Respirations unlabored, no coughing  Heart:  regular rate and rhythm and no murmur, Appears well perfused  Extremities: No edema  Skin: Skin color, texture, turgor normal, no rashes or lesions on visualized portions of skin   Neurologic: No gross deficits   Previous notes and tests were reviewed. The plan was reviewed with the patient/family, and all questions/concerned were addressed.  It was my pleasure to see Deanna Rowe today and participate in her care. Please feel free to contact me with any questions or concerns.  Sincerely,  Roney [name], MD  Allergy & Immunology  [facility] Office: [phone]

## 2022-03-22 ENCOUNTER — Ambulatory Visit: Payer: 59 | Admitting: Physical Therapy

## 2022-03-22 ENCOUNTER — Encounter: Payer: Self-pay | Admitting: Physical Therapy

## 2022-03-22 ENCOUNTER — Other Ambulatory Visit (HOSPITAL_COMMUNITY): Payer: Self-pay

## 2022-03-22 ENCOUNTER — Ambulatory Visit: Payer: 59 | Admitting: Occupational Therapy

## 2022-03-22 DIAGNOSIS — M6281 Muscle weakness (generalized): Secondary | ICD-10-CM | POA: Diagnosis not present

## 2022-03-22 DIAGNOSIS — R278 Other lack of coordination: Secondary | ICD-10-CM

## 2022-03-22 DIAGNOSIS — R41844 Frontal lobe and executive function deficit: Secondary | ICD-10-CM

## 2022-03-22 DIAGNOSIS — R293 Abnormal posture: Secondary | ICD-10-CM

## 2022-03-22 DIAGNOSIS — R29818 Other symptoms and signs involving the nervous system: Secondary | ICD-10-CM

## 2022-03-22 DIAGNOSIS — R2681 Unsteadiness on feet: Secondary | ICD-10-CM

## 2022-03-22 DIAGNOSIS — R2689 Other abnormalities of gait and mobility: Secondary | ICD-10-CM

## 2022-03-22 DIAGNOSIS — R41840 Attention and concentration deficit: Secondary | ICD-10-CM | POA: Diagnosis not present

## 2022-03-22 NOTE — Patient Instructions (Addendum)
Memory Compensation Strategies  Use "WARM" strategy. W= write it down A=  associate it R=  repeat it M=  make a mental picture  You can keep a Memory Notebook. Use a 3-ring notebook with sections for the following:  calendar, important names and phone numbers, medications, doctors' names/phone numbers, "to do list"/reminders, and a section to journal what you did each day  Use a calendar to write appointments down.  Write yourself a schedule for the day.  This can be placed on the calendar or in a separate section of the Memory Notebook.  Keeping a regular schedule can help memory.  Use medication organizer with sections for each day or morning/evening pills  You may need help loading it  Keep a basket, or pegboard by the door.   Place items that you need to take out with you in the basket or on the pegboard.  You may also want to include a message board for reminders.  Use sticky notes. Place sticky notes with reminders in a place where the task is performed.  For example:  "turn off the stove" placed by the stove, "lock the door" placed on the door at eye level, "take your medications" on the bathroom mirror or by the place where you normally take your medications  Use alarms, timers, and/or a reminder app. Use while cooking to remind yourself to check on food or as a reminder to take your medicine, or as a reminder to make a call, or as a reminder to perform another task, etc.  Use a voice recorder app or small tape recorder to record important information and notes for yourself. Go back at the end of the day and listen to these.              Keeping Thinking Skills Sharp: 1. Jigsaw puzzles 2. Card/board games 3. Talking on the phone/social events 4. [email or web address] 5. Online games 6. Word serches/crossword puzzles 7.  Logic puzzles 8. Aerobic exercise (stationary bike) 9. Eating balanced diet (fruits & veggies) 10. Drink water 11. Try something new--new recipe,  hobby 12. Crafts 13. Do a variety of activities that are challenging 14.  Plan weekly meals and write a grocery list 15. Add cognitive activities to walking/exercising (think of animal/food/city with each letter of the alphabet, counting backwards, thinking of as many vegetables as you can, etc.).--Only do this  If safe (no freezing/falls).Keeping Thinking Skills Sharp: 1. Jigsaw puzzles 2. Card/board games 3. Talking on the phone/social events 4. [email or web address] 5. Online games 6. Word serches/crossword puzzles 7.  Logic puzzles 8. Aerobic exercise (stationary bike) 9. Eating balanced diet (fruits & veggies) 10. Drink water 11. Try something new--new recipe, hobby 12. Crafts 13. Do a variety of activities that are challenging 14.  Plan weekly meals and write a grocery list      Ways to prevent future Parkinson's related complications: 1.   Exercise regularly,  2.   Focus on BIGGER movements during daily activities- really reach overhead, straighten elbows and extend fingers 3.   When dressing or reaching for your seatbelt make sure to use your body to assist by twisting while you reach-this can help to minimize stress on the shoulder and reduce the risk of a rotator cuff tear 4.   Swing your arms when you walk! People with PD are at increased risk for frozen shoulder and swinging your arms can reduce this risk.  Ways to prevent future Parkinson's related complications: 1.   Exercise regularly,  2.   Focus on BIGGER movements during daily activities- really reach overhead, straighten elbows and extend fingers 3.   When dressing or reaching for your seatbelt make sure to use your body to assist by twisting while you reach-this can help to minimize stress on the shoulder and reduce the risk of a rotator cuff tear 4. Make sure to perform head turns and scan up and down in your environment to minimize fall risk due to visual deficits People with PD are at  increased risk for frozen shoulder and exercising your arms can reduce this risk.

## 2022-03-22 NOTE — Therapy (Signed)
OUTPATIENT PHYSICAL THERAPY NEURO TREATMENT   Patient Name: Deanna Rowe MRN: [id number] DOB:[birth year], 66 y.o., female Today's Date: 03/22/2022   PCP: Aretta Nip, MD REFERRING PROVIDER: Ward Givens, NP    PT End of Session - 03/22/22 1103     Visit Number 16    Number of Visits 23    Date for PT Re-Evaluation 05/14/22   due to delay in scheduling/pt going on vacation   Authorization Type [facility]    PT Start Time 1103    PT Stop Time 1143    PT Time Calculation (min) 40 min    Equipment Utilized During Treatment Gait belt    Activity Tolerance Patient tolerated treatment well    Behavior During Therapy [facility] for tasks assessed/performed                      Past Medical History:  Diagnosis Date   Anemia    Anxiety    Arthritis    L HIP   Asthma    Avascular necrosis of hip ([place])    LEFT   Back pain    Chest pain    Constipation    Depression    Diabetes mellitus    Diabetes mellitus, type II (HCC)    Dry cough    Edema, lower extremity    Gait abnormality 11/18/2019   GERD (gastroesophageal reflux disease)    High cholesterol    Hypertension    Insomnia    takes Ambien nightly   Joint pain    Kidney disease    Kidney disease    Obesity    Parkinsonism 03/02/2017   PONV (postoperative nausea and vomiting)    RLS (restless legs syndrome) 01/15/2018   Sleep apnea    Swallowing difficulty    Thyroid disease    TIA (transient ischemic attack) 2012   no residual problems   Urgency incontinence    Vitamin D deficiency    Past Surgical History:  Procedure Laterality Date   DILATION AND CURETTAGE OF UTERUS     ESOPHAGEAL MANOMETRY N/A 09/03/2012   Procedure: ESOPHAGEAL MANOMETRY (EM);  Surgeon: Garlan Fair, MD;  Location: [facility];  Service: Endoscopy;  Laterality: N/A;   ESOPHAGEAL MANOMETRY N/A 06/19/2017   Procedure: ESOPHAGEAL MANOMETRY (EM);  Surgeon: Clarene Essex, MD;  Location: [facility];  Service:  Endoscopy;  Laterality: N/A;   EXPLORATORY LAPAROTOMY     GANGLION CYST EXCISION     JOINT REPLACEMENT  2011   rt total hip   KNEE ARTHROSCOPY     PILONIDAL CYST / SINUS EXCISION     TOTAL HIP ARTHROPLASTY Left 10/09/2013   Procedure: LEFT TOTAL HIP ARTHROPLASTY ANTERIOR APPROACH;  Surgeon: [name], MD;  Location: [facility];  Service: Orthopedics;  Laterality: Left;   Patient Active Problem List   Diagnosis Date Noted   Allergic conjunctivitis of both eyes 07/06/2020   Gait abnormality 11/18/2019   OSA on CPAP 06/04/2019   Pain in joint of left shoulder 03/28/2019   Seasonal allergic conjunctivitis 02/07/2019   Chronic intermittent hypoxia with obstructive sleep apnea 01/25/2019   Severe obstructive sleep apnea 01/25/2019   Excessive daytime sleepiness 01/03/2019   Non-restorative sleep 01/03/2019   Nocturia more than twice per night 01/03/2019   Weight gain, abnormal 01/03/2019   Snorings 01/03/2019   Trochanteric bursitis of right hip 04/27/2018   Encounter for routine screening for malformation using ultrasonics 02/08/2018   Abnormal weight gain  02/08/2018   Acid indigestion 02/08/2018   Acute antritis 02/08/2018   Class 2 severe obesity with serious comorbidity and body mass index (BMI) of 39.0 to 39.9 in adult ([place]) 02/08/2018   Perennial allergic rhinitis 02/08/2018   Antiphospholipid syndrome ([place]) 02/08/2018   Arthralgia of hip or thigh 02/08/2018   Asthma, cough variant 02/08/2018   Vitamin D deficiency 02/08/2018   Backhand tennis elbow 02/08/2018   Cannot sleep 02/08/2018   Chronic kidney disease (CKD), stage III (moderate) ([place]) 02/08/2018   Current drug use 02/08/2018   Depression, major, single episode, in partial remission ([place]) 02/08/2018   Diabetes mellitus with polyneuropathy ([place]) 02/08/2018   Difficulty hearing 02/08/2018   Disturbance of skin sensation 02/08/2018   Hypertension associated with diabetes ([place]) 02/08/2018   Extremity pain 02/08/2018    Facet syndrome, lumbar 02/08/2018   Factor V Leiden ([place]) 02/08/2018   Fungal infection of nail 02/08/2018   Gastroenteritis and colitis, viral 02/08/2018   Hypoglycemia 02/08/2018   Menopause 02/08/2018   Other long term (current) drug therapy 02/08/2018   Other osteonecrosis, unspecified femur ([place]) 02/08/2018   Pure hypercholesterolemia 02/08/2018   Recurrent major depressive episodes ([place]) 02/08/2018   Syncope and collapse 02/08/2018   Not well controlled moderate persistent asthma 02/08/2018   Cough, persistent 02/08/2018   Restless leg syndrome 01/15/2018   Degeneration of lumbar intervertebral disc 11/21/2017   Tremor of right hand 03/02/2017   Parkinson disease 03/02/2017   UTI (urinary tract infection) 12/01/2015   Type 2 diabetes mellitus with stage 3b chronic kidney disease, with long-term current use of insulin ([place]) 06/03/2015   Fast heart beat 04/09/2015   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 2 diabetes mellitus ([place]) 04/24/2014   Postoperative anemia due to acute blood loss 10/10/2013   Avascular necrosis of bone of left hip ([place]) 10/09/2013   Arthritis of pelvic region, degenerative 10/09/2013   Breath shortness 06/18/2013   Diabetes mellitus type 2, uncontrolled 05/31/2013   Intermittent claudication ([place]) 04/04/2013   Anxiety    Gastroesophageal reflux disease 09/12/2012   Clinical depression 02/21/2012   Stroke ([place]) 02/11/2012   History of revision of total replacement of right hip joint 04/04/2009    ONSET DATE: 12/02/2021   REFERRING DIAG: G20 (ICD-10-CM) - Parkinson disease ([place]) R29.6 (ICD-10-CM) - Falls frequently   THERAPY DIAG:  Other symptoms and signs involving the nervous system  Muscle weakness (generalized)  Other lack of coordination  Abnormal posture  Other abnormalities of gait and mobility  Rationale for Evaluation and Treatment Rehabilitation  SUBJECTIVE:  SUBJECTIVE STATEMENT: Has been using her cane more about home. Reports her heel has been bothersome and has a bone spur. Saw the doctor about this and got a shot and some pills and reports it has been feeling better. No new falls.   Pt accompanied by: self  PERTINENT HISTORY: Parkinsonism (2018),TIA, thyroid disease, kidney disease, HTN, HLD, GERD, DMII, depression, L hip avascular necrosis, anxiety, anemia, R THA 2011, L THA 2015, knee arthroscopy   PAIN:  Are you having pain? Yes: NPRS scale: 8/10 Pain location: R heel Pain description: Sharp Aggravating factors: Standing Relieving factors: Sitting, Icing, pain medication   PRECAUTIONS: Fall, No driving.   FALLS: Has patient fallen in last 6 months? Yes. Number of falls 10  VITALS There were no vitals filed for this visit.   PLOF: Independent with community mobility with device  PATIENT GOALS Wants to stop falling, improve balance.    OBJECTIVE:   COGNITION: Overall cognitive status: Impaired  TODAY'S TREATMENT:  NMR:  Pt performs PWR! Moves in Standing position:    PWR! Up for improved posture x10 reps, pt demonstrated good technique with mini squat position   PWR! Rock for improved weight shifting x10 reps, cues to look up at hands, pt needing to tap chair intermittently for balance.   PWR! Twist for improved trunk rotation x5 reps each side, cues to look at hands when twisting and looking at hands   PWR! Step for improved step initiation -verbally reviewed with pt and husband, they report that pt is performing at home.   Cues provided for proper technique. Pt needing to sit down between each exercise due to reports of incr RLE pain. Unable to perform standing PWR Step, due to incr RLE pain and pt requesting to no longer work on standing exercises  Reviewed standing PWR moves from HEP to see if pt is safe to  perform these at home. Pt demonstrated good technique, discussed having a chair in front of pt and something behind her for safety and supervision from husband.  Verbally reviewed seated hamstring and standing calf stretch for home. Pt has been performing these, cues to hold for 30 seconds    Therapeutic Exercise: SciFit with BUE/BLE for strengthening, ROM, activity tolerance, reciprocal movement at [place] level 2.5 for 8 minutes. Pt reporting no pain when performing. Pt initially starting at 16-20 spm, cues to incr speed with pt able to keep spm at 80-100 for remainder     Therapeutic Activity: Reviewed fall prevention from previous OT visit: using rollator at all times for safety, having supervision from her husband while pt does the stairs due to deficits, looking into fall device such as a Life Alert or Apple Watch   GAIT: Gait pattern:  genu valgus of RLE, step through pattern, decreased stride length, Right foot flat, Left foot flat, decreased trunk rotation, trunk flexed, and narrow BOS Distance walked: Clinic distances. Assistive device utilized: Environmental consultant - 4 wheeled Level of assistance: SBA Comments: Cues to stay inside the rollator at all times. Pt did better with turning with rollator all the way and then backing up to mat table to sit down. Pt needing cues for proper UE placement when performing sit > stand to rollator   Adjusted pt's rollator to a lower height as it was too high and pt had incr shoulder elevation during gait. Pt reporting feeling better after it was adjusted.    PATIENT EDUCATION: Education details: Began to review HEP, see therapeutic activity section.  Person  educated: Patient and Spouse Education method: Explanation, Demonstration, and Handouts Education comprehension: verbalized understanding and returned demonstration   HOME EXERCISE PROGRAM: Access Code: [email or web address] URL: [email or web address] Date: 12/22/2021 Prepared by: Mickie Bail  Plaster  Exercises - Sit to Stand Without Arm Support  - 1 x daily - 7 x weekly - 2 sets - 5-6 reps - Heel Raises with Counter Support  - 1 x daily - 7 x weekly - 3 sets - 10 reps - Step sideways with arms reaching at counter   - 1 x daily - 7 x weekly - 3 sets - 10 reps (same as PWR step) - Alternating Step Backward with Support  - 1 x daily - 7 x weekly - 3 sets - 10 reps - Standing Quarter Turn with Counter Support  - 1 x daily - 7 x weekly - 3 sets - 10 reps - Seated Hamstring Stretch  - 1 x daily - 7 x weekly - 1 sets - 3-4 reps - 30 second hold - Calf stretch  - 1 x daily - 7 x weekly - 1 sets - 3-4 reps - 30-45 second hold   Standing PWR moves (with supervision from husband and having chair in front of pt)   GOALS: Goals reviewed with patient? Yes   UPDATED/ONGOING LTGS FOR RE-CERT LONG TERM GOALS: Target date: 04/01/22 (UPDATED DATE)  Pt will be independent with final HEP for PD specific deficits/balance in order to build upon functional gains made in therapy. Baseline: pt performing at home, will benefit from updates/additions  Goal status: ON-GOING  2.  Pt perform L lateral stepping strategy in 1-2 steps in order to demo improved balance recovery.  Baseline: 2-3 steps on 03/15/22 Goal status: NEW  3.  Pt will improve gait speed with rollator to at least 2.4 ft/sec in order to demo improved community mobility.  Baseline: 15.8 seconds with rollator = 2.06 ft/sec, pt only using cane now when going up and down the stairs  Goal status: NEW  4.  Pt will improve 5x sit<>stand to less than or equal to 16 sec with no UE support from lower surfaces to demonstrate improved functional strength and transfer efficiency.  Baseline: 18.5 seconds with no UE support from lower chair  Goal status: NEW  5.  Pt will perform at least 12 steps with use of cane with proper sequencing with mod I in order to demo improved safety with stairs at home.  Baseline: supervision/CGA Goal status:  NEW  ASSESSMENT:  CLINICAL IMPRESSION: Today's skilled session focused on reviewing HEP to work on finalizing it at end of bout of therapy. Pt was recently diagnosed with a R heel spur from the doctor and pt unable to tolerate much standing activity today. Was able to begin to go over standing PWR moves to see if these would still be appropriate for pt to do. Pt tolerated well with use of chair in front of pt and can perform with husband supervision. Pt did better today with turning all the way with rollator and backing up to the mat table without verbal cues. Will continue to progress towards LTGs.   OBJECTIVE IMPAIRMENTS Abnormal gait, decreased activity tolerance, decreased balance, decreased coordination, difficulty walking, decreased strength, impaired flexibility, impaired tone, and postural dysfunction.   ACTIVITY LIMITATIONS bending, squatting, stairs, transfers, and locomotion level  PARTICIPATION LIMITATIONS: driving and community activity  PERSONAL FACTORS Behavior pattern, Past/current experiences, Time since onset of injury/illness/exacerbation, and 3+ comorbidities: Parkinsonism (2018),TIA, thyroid disease, kidney disease, HTN,  HLD, GERD, DMII, depression, L hip avascular necrosis, anxiety, anemia, R THA 2011, L THA 2015, knee arthroscopy   are also affecting patient's functional outcome.   REHAB POTENTIAL: Good  CLINICAL DECISION MAKING: Evolving/moderate complexity  EVALUATION COMPLEXITY: Moderate  PLAN: PT FREQUENCY: 2x/week  PT DURATION: 8 weeks  PLANNED INTERVENTIONS: Therapeutic exercises, Therapeutic activity, Neuromuscular re-education, Balance training, Gait training, Self Care, Stair training, Vestibular training, DME instructions, Manual therapy, and Re-evaluation  PLAN FOR NEXT SESSION:  Finish reviewing HEP (pt was unable to tolerate reviewing it today due to incr RLE leg and heel pain). Continue with stepping strategies (posterior and L lateral). Sit to  stands from lower surfaces (like getting up from recliner)functional strength. Work on IT trainer. Scifit for endurance,forward lunge over beam, reaching out of BOS, resisted walking.  Arliss Journey, PT, DPT  03/22/2022, 12:28 PM

## 2022-03-22 NOTE — Therapy (Signed)
OUTPATIENT OCCUPATIONAL THERAPY [facility]   Patient Name: Deanna Rowe MRN: [id number] DOB:July 14, 1955, 66 y.o., female Today's Date: 03/15/2022  PCP: Dr. Radene Ou REFERRING PROVIDER: Ward Givens NP   OT End of Session - 03/15/22 1439     Visit Number 12    Number of Visits 25    Date for OT Re-Evaluation 03/17/22    Authorization Type [facility], MCR secondary    Authorization - Visit Number 12    Authorization - Number of Visits 12    OT Start Time 1018    OT Stop Time 1100    OT Time Calculation (min) 42 min    Activity Tolerance Patient tolerated treatment well    Behavior During Therapy WFL for tasks assessed/performed                 Past Medical History:  Diagnosis Date   Anemia    Anxiety    Arthritis    L HIP   Asthma    Avascular necrosis of hip ([place])    LEFT   Back pain    Chest pain    Constipation    Depression    Diabetes mellitus    Diabetes mellitus, type II (HCC)    Dry cough    Edema, lower extremity    Gait abnormality 11/18/2019   GERD (gastroesophageal reflux disease)    High cholesterol    Hypertension    Insomnia    takes Ambien nightly   Joint pain    Kidney disease    Kidney disease    Obesity    Parkinsonism 03/02/2017   PONV (postoperative nausea and vomiting)    RLS (restless legs syndrome) 01/15/2018   Sleep apnea    Swallowing difficulty    Thyroid disease    TIA (transient ischemic attack) 2012   no residual problems   Urgency incontinence    Vitamin D deficiency    Past Surgical History:  Procedure Laterality Date   DILATION AND CURETTAGE OF UTERUS     ESOPHAGEAL MANOMETRY N/A 09/03/2012   Procedure: ESOPHAGEAL MANOMETRY (EM);  Surgeon: Garlan Fair, MD;  Location: [facility];  Service: Endoscopy;  Laterality: N/A;   ESOPHAGEAL MANOMETRY N/A 06/19/2017   Procedure: ESOPHAGEAL MANOMETRY (EM);  Surgeon: Clarene Essex, MD;  Location: [facility];  Service: Endoscopy;  Laterality: N/A;    EXPLORATORY LAPAROTOMY     GANGLION CYST EXCISION     JOINT REPLACEMENT  2011   rt total hip   KNEE ARTHROSCOPY     PILONIDAL CYST / SINUS EXCISION     TOTAL HIP ARTHROPLASTY Left 10/09/2013   Procedure: LEFT TOTAL HIP ARTHROPLASTY ANTERIOR APPROACH;  Surgeon: [name], MD;  Location: [facility];  Service: Orthopedics;  Laterality: Left;   Patient Active Problem List   Diagnosis Date Noted   Allergic conjunctivitis of both eyes 07/06/2020   Gait abnormality 11/18/2019   OSA on CPAP 06/04/2019   Pain in joint of left shoulder 03/28/2019   Seasonal allergic conjunctivitis 02/07/2019   Chronic intermittent hypoxia with obstructive sleep apnea 01/25/2019   Severe obstructive sleep apnea 01/25/2019   Excessive daytime sleepiness 01/03/2019   Non-restorative sleep 01/03/2019   Nocturia more than twice per night 01/03/2019   Weight gain, abnormal 01/03/2019   [place]ings 01/03/2019   Trochanteric bursitis of right hip 04/27/2018   Encounter for routine screening for malformation using ultrasonics 02/08/2018   Abnormal weight gain 02/08/2018   Acid indigestion 02/08/2018   Acute  antritis 02/08/2018   Class 2 severe obesity with serious comorbidity and body mass index (BMI) of 39.0 to 39.9 in adult ([place]) 02/08/2018   Perennial allergic rhinitis 02/08/2018   Antiphospholipid syndrome ([place]) 02/08/2018   Arthralgia of hip or thigh 02/08/2018   Asthma, cough variant 02/08/2018   Vitamin D deficiency 02/08/2018   Backhand tennis elbow 02/08/2018   Cannot sleep 02/08/2018   Chronic kidney disease (CKD), stage III (moderate) ([place]) 02/08/2018   Current drug use 02/08/2018   Depression, major, single episode, in partial remission ([place]) 02/08/2018   Diabetes mellitus with polyneuropathy ([place]) 02/08/2018   Difficulty hearing 02/08/2018   Disturbance of skin sensation 02/08/2018   Hypertension associated with diabetes ([place]) 02/08/2018   Extremity pain 02/08/2018   Facet syndrome, lumbar  02/08/2018   Factor V Leiden ([place]) 02/08/2018   Fungal infection of nail 02/08/2018   Gastroenteritis and colitis, viral 02/08/2018   Hypoglycemia 02/08/2018   Menopause 02/08/2018   Other long term (current) drug therapy 02/08/2018   Other osteonecrosis, unspecified femur ([place]) 02/08/2018   Pure hypercholesterolemia 02/08/2018   Recurrent major depressive episodes ([place]) 02/08/2018   Syncope and collapse 02/08/2018   Not well controlled moderate persistent asthma 02/08/2018   Cough, persistent 02/08/2018   Restless leg syndrome 01/15/2018   Degeneration of lumbar intervertebral disc 11/21/2017   Tremor of right hand 03/02/2017   Parkinson disease 03/02/2017   UTI (urinary tract infection) 12/01/2015   Type 2 diabetes mellitus with stage 3b chronic kidney disease, with long-term current use of insulin ([place]) 06/03/2015   Fast heart beat 04/09/2015   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 2 diabetes mellitus ([place]) 04/24/2014   Postoperative anemia due to acute blood loss 10/10/2013   Avascular necrosis of bone of left hip ([place]) 10/09/2013   Arthritis of pelvic region, degenerative 10/09/2013   Breath shortness 06/18/2013   Diabetes mellitus type 2, uncontrolled 05/31/2013   Intermittent claudication ([place]) 04/04/2013   Anxiety    Gastroesophageal reflux disease 09/12/2012   Clinical depression 02/21/2012   Stroke ([place]) 02/11/2012   History of revision of total replacement of right hip joint 04/04/2009    ONSET DATE: 12/15/21  REFERRING DIAG: Parkinsonism  THERAPY DIAG:  Other symptoms and signs involving the nervous system - Plan: Ot plan of care cert/re-cert  Attention and concentration deficit - Plan: Ot plan of care cert/re-cert  Muscle weakness (generalized) - Plan: Ot plan of care cert/re-cert  Other lack of coordination - Plan: Ot plan of care cert/re-cert  Other abnormalities of gait and mobility - Plan: Ot plan of care cert/re-cert  Abnormal posture - Plan: Ot plan of care  cert/re-cert  Frontal lobe and executive function deficit - Plan: Ot plan of care cert/re-cert  Unsteadiness on feet - Plan: Ot plan of care cert/re-cert  Rationale for Evaluation and Treatment Rehabilitation  SUBJECTIVE:   SUBJECTIVE STATEMENT: Reports continued foot pain from plantar fascitis  Pt accompanied by: self, husband  PERTINENT HISTORY: PERTINENT HISTORY: Parkinsonism (2018),TIA, thyroid disease, kidney disease, HTN, HLD, GERD, DMII, depression, L hip avascular necrosis, anxiety, anemia, R THA 2011, L THA 2015, knee arthroscopy   PRECAUTIONS: Fall  WEIGHT BEARING RESTRICTIONS No  PAIN:  Are you having pain? No (only soreness from recent fall)   FALLS: Has patient fallen in last 6 months? Yes. Number of falls 5  LIVING ENVIRONMENT: Lives with: lives with their spouse Lives in: House/apartment  PLOF: Independent  PATIENT GOALS to be able style hair  OBJECTIVE: from eval  HAND DOMINANCE: Right   FUNCTIONAL OUTCOME MEASURES: Fastening/unfastening 3 buttons: 50.68 Physical performance test: PPT#2 (simulated eating) 15.03 & PPT#4 (donning/doffing jacket): 13.34  COORDINATION: 9 Hole Peg test: Right: 36.91 sec; Left: 38.16 sec Box and Blocks:  Right 33blocks, Left 37blocks   TODAY'S TREATMENT:  Pt/caregiver education regarding: Preventing future complications, memory complications, keeping thinking skills sharp with pt/ husband. Lengthy discussion with pt/ husband regarding safety at home and recommendation that pt uses her rollator at all times and that she does not climb the stairs without supervision/ assistance. Therapist recommends that patient and husband consider an apple watch with fall detection or life alert. Pt's husband states they are looking into this. Pt/ husband verbalize understanding of all education provided today.        PATIENT EDUCATION: Education details: Preventing future complications, memory complications, keeping thinking  skills sharp, recommendation for apple watch, life alert Person educated: Patient and Spouse Education method: Explanation,  handouts Education comprehension: verbalized understanding,   HOME EXERCISE PROGRAM: 12/27/21: PWR! Seated, coordination HEP, writing strategies 12/30/21: PWR! Hands  02/03/22- PWR! Supine basic 4 02/15/22- recommendation for staggered feet and use of rollator for laundry, education regarding pt visual deficits, inability to track inferiorly. 03/08/22: Bag ex's for ADLS   ASSESSMENT:  CLINICAL IMPRESSION: Pt and husband verbalize understanding of education provided today. Pt continues to be limited by cognitive and visual deficits. PERFORMANCE DEFICITS in functional skills including ADLs, IADLs, coordination, dexterity, proprioception, sensation, tone, ROM, strength, pain, flexibility, FMC, GMC, mobility, balance, endurance, decreased knowledge of precautions, decreased knowledge of use of DME, and UE functional use, bradykinesia , cognitive skills including attention, energy/drive, learn, memory, problem solving, safety awareness, thought, and understand, and psychosocial skills including coping strategies, environmental adaptation, habits, interpersonal interactions, and routines and behaviors.   IMPAIRMENTS are limiting patient from ADLs, IADLs, play, leisure, and social participation.   COMORBIDITIES may have co-morbidities  that affects occupational performance. Patient will benefit from skilled OT to address above impairments and improve overall function.  MODIFICATION OR ASSISTANCE TO COMPLETE EVALUATION: No modification of tasks or assist necessary to complete an evaluation.  OT OCCUPATIONAL PROFILE AND HISTORY: Detailed assessment: Review of records and additional review of physical, cognitive, psychosocial history related to current functional performance.  CLINICAL DECISION MAKING: LOW - limited treatment options, no task modification necessary  REHAB  POTENTIAL: Good  EVALUATION COMPLEXITY: Low     GOALS: Goals reviewed with patient? No  SHORT TERM GOALS: Target date: 02/21/22- extended due to scheduling  I with PD specific HEP Baseline: Goal status: MET (w/ caregiver cues)   2.  Pt will verbalize understanding of adapted strategies to maximize safety and I with ADLs/ IADLs .  Baseline:  Goal status: MET  3.  Pt will demonstrate improved bilateral UE functional use as evidenced by increasing box/ blocks score by 3 blocks Baseline: RUE 33, LUE 37 Goal status: MET (Rt = 38, Lt = 40)   4.  Pt will demonstrate improved ease with feeding as evidenced by decreasing PPT#2 (self feeding) by 3 secs Baseline: 15.03 Goal status: MET (9.06 sec)   5.  Pt will report increased ease with styling her hair Baseline: Pt reports difficulty Goal status: met, per pt report    LONG TERM GOALS: Target date: 03/17/22-  1.Pt will verbalize understanding of ways to prevent future PD related complications and PD community resources.  Baseline:  Goal status:met, PT provided community resources, therapist provided education regarding preventing future PD complications.  2.  Pt will demonstrate improved fine motor coordination for ADLs as evidenced by decreasing bilateral  9 hole peg test score for by 3 secs  Baseline: RUE 36.91, LUE 38.16 Goal status: ongoing for RUE 35.53 , LUE 34.78- met for LUE only  3.  Pt will demonstrate understanding of memory compensations and ways to keep thinking skills sharp  Baseline:  Goal status: met 4.  Pt will demonstrate improved ease with fastening buttons as evidenced by decreasing 3 button/ unbutton time to 47 secs or less  Baseline: 50.68 Goal status:  met 42.66   PLAN: OT FREQUENCY: 2x/week  OT DURATION: 4 weeks  PLANNED INTERVENTIONS: self care/ADL training, therapeutic exercise, therapeutic activity, neuromuscular re-education, manual therapy, passive range of motion, balance training, stair  training, functional mobility training, aquatic therapy, ultrasound, paraffin, fluidotherapy, moist heat, cryotherapy, contrast bath, patient/family education, cognitive remediation/compensation, visual/perceptual remediation/compensation, energy conservation, coping strategies training, DME and/or AE instructions, and Re-evaluation  RECOMMENDED OTHER SERVICES: PT  CONSULTED AND AGREED WITH PLAN OF CARE: Patient  PLAN FOR NEXT SESSION: review coordination HEP, PD education flow sheet, finish checking goals and d/c.   Theone Murdoch, OTR/L [phone] Phone: [phone] 2:39 PM 03/15/22  03/15/22 2:39 PM Phone [phone] FAX [phone]

## 2022-03-24 ENCOUNTER — Ambulatory Visit: Payer: 59 | Admitting: Physical Therapy

## 2022-03-24 ENCOUNTER — Ambulatory Visit: Payer: 59 | Admitting: Occupational Therapy

## 2022-03-24 ENCOUNTER — Other Ambulatory Visit (HOSPITAL_BASED_OUTPATIENT_CLINIC_OR_DEPARTMENT_OTHER): Payer: Self-pay

## 2022-03-24 ENCOUNTER — Other Ambulatory Visit (HOSPITAL_COMMUNITY): Payer: Self-pay

## 2022-03-24 ENCOUNTER — Other Ambulatory Visit: Payer: Self-pay

## 2022-03-24 ENCOUNTER — Encounter: Payer: Self-pay | Admitting: Occupational Therapy

## 2022-03-24 DIAGNOSIS — R293 Abnormal posture: Secondary | ICD-10-CM

## 2022-03-24 DIAGNOSIS — R2689 Other abnormalities of gait and mobility: Secondary | ICD-10-CM | POA: Diagnosis not present

## 2022-03-24 DIAGNOSIS — R278 Other lack of coordination: Secondary | ICD-10-CM | POA: Diagnosis not present

## 2022-03-24 DIAGNOSIS — R29818 Other symptoms and signs involving the nervous system: Secondary | ICD-10-CM | POA: Diagnosis not present

## 2022-03-24 DIAGNOSIS — R2681 Unsteadiness on feet: Secondary | ICD-10-CM

## 2022-03-24 DIAGNOSIS — F251 Schizoaffective disorder, depressive type: Secondary | ICD-10-CM | POA: Diagnosis not present

## 2022-03-24 DIAGNOSIS — M6281 Muscle weakness (generalized): Secondary | ICD-10-CM

## 2022-03-24 DIAGNOSIS — R41844 Frontal lobe and executive function deficit: Secondary | ICD-10-CM

## 2022-03-24 DIAGNOSIS — R41840 Attention and concentration deficit: Secondary | ICD-10-CM | POA: Diagnosis not present

## 2022-03-24 MED ORDER — BUPROPION HCL ER (XL) 150 MG PO TB24
450.0000 mg | ORAL_TABLET | Freq: Every morning | ORAL | 1 refills | Status: DC
  Filled 2022-03-24: qty 270, 90d supply, fill #0
  Filled 2022-06-10 – 2022-06-20 (×2): qty 270, 90d supply, fill #1

## 2022-03-24 MED ORDER — REXULTI 2 MG PO TABS
2.0000 mg | ORAL_TABLET | Freq: Every morning | ORAL | 1 refills | Status: DC
  Filled 2022-03-24: qty 90, 90d supply, fill #0
  Filled 2022-06-10: qty 90, 90d supply, fill #1

## 2022-03-24 MED ORDER — ROPINIROLE HCL 2 MG PO TABS
2.0000 mg | ORAL_TABLET | Freq: Two times a day (BID) | ORAL | 1 refills | Status: DC
  Filled 2022-03-24 – 2022-03-30 (×2): qty 90, 45d supply, fill #0
  Filled 2022-05-19: qty 90, 45d supply, fill #1

## 2022-03-24 MED ORDER — LORAZEPAM 2 MG PO TABS
2.0000 mg | ORAL_TABLET | Freq: Three times a day (TID) | ORAL | 1 refills | Status: DC
  Filled 2022-03-24 – 2022-03-30 (×2): qty 270, 90d supply, fill #0
  Filled 2022-03-30: qty 1, 1d supply, fill #0
  Filled 2022-03-30: qty 269, 89d supply, fill #0
  Filled 2022-08-11: qty 95, 32d supply, fill #1
  Filled 2022-08-11: qty 175, 58d supply, fill #1

## 2022-03-24 MED ORDER — ZOLPIDEM TARTRATE 5 MG PO TABS
5.0000 mg | ORAL_TABLET | Freq: Every day | ORAL | 1 refills | Status: DC
  Filled 2022-03-24 – 2022-04-22 (×2): qty 90, 90d supply, fill #0
  Filled 2022-08-11: qty 90, 90d supply, fill #1

## 2022-03-24 MED ORDER — TRINTELLIX 20 MG PO TABS
20.0000 mg | ORAL_TABLET | Freq: Every morning | ORAL | 1 refills | Status: DC
  Filled 2022-03-24 – 2022-04-22 (×2): qty 90, 90d supply, fill #0
  Filled 2022-07-04 – 2022-07-11 (×2): qty 90, 90d supply, fill #1

## 2022-03-24 NOTE — Patient Instructions (Addendum)
(  Exercise) Monday Tuesday Wednesday Thursday Friday Saturday Sunday   PWR! supine           PWR!seated           PWR! Standing Only with some with you           Coordination HEP           PWR! hands           Sit to stand           Bag exercises                                                    PWR! Hands  With arms stretched out in front of you (elbows straight), perform the following: PWR! Rock: Move wrists up and down [employer]! Twist: Twist palms up and down BIG  Then, start with elbows bent and hands closed. PWR! Hands: Push hands out BIG. Elbows straight, wrists up, fingers open and spread apart BIG. (Can also perform by pushing down on table, chair, knees. Push above head, out to the side, behind you, in front of you.)   ** Make each movement big and deliberate so that you feel the movement.  Perform at least 10 repetitions 1x/day, but perform PWR! hands throughout the day when you are having trouble using your hands (picking up/manipulating small objects, writing, eating, typing, sewing, buttoning, etc.).

## 2022-03-24 NOTE — Therapy (Signed)
OUTPATIENT PHYSICAL THERAPY NEURO TREATMENT   Patient Name: Deanna Rowe MRN: [id number] DOB:September 22, 1955, 66 y.o., female Today's Date: 03/24/2022   PCP: Aretta Nip, MD REFERRING PROVIDER: Ward Givens, NP    PT End of Session - 03/24/22 1107     Visit Number 17    Number of Visits 23    Date for PT Re-Evaluation 05/14/22   due to delay in scheduling/pt going on vacation   Authorization Type [facility]    PT Start Time 1105   Handoff w/OT   PT Stop Time 1145    PT Time Calculation (min) 40 min    Equipment Utilized During Treatment Gait belt    Activity Tolerance Patient limited by pain    Behavior During Therapy WFL for tasks assessed/performed                       Past Medical History:  Diagnosis Date   Anemia    Anxiety    Arthritis    L HIP   Asthma    Avascular necrosis of hip ([place])    LEFT   Back pain    Chest pain    Constipation    Depression    Diabetes mellitus    Diabetes mellitus, type II (HCC)    Dry cough    Edema, lower extremity    Gait abnormality 11/18/2019   GERD (gastroesophageal reflux disease)    High cholesterol    Hypertension    Insomnia    takes Ambien nightly   Joint pain    Kidney disease    Kidney disease    Obesity    Parkinsonism 03/02/2017   PONV (postoperative nausea and vomiting)    RLS (restless legs syndrome) 01/15/2018   Sleep apnea    Swallowing difficulty    Thyroid disease    TIA (transient ischemic attack) 2012   no residual problems   Urgency incontinence    Vitamin D deficiency    Past Surgical History:  Procedure Laterality Date   DILATION AND CURETTAGE OF UTERUS     ESOPHAGEAL MANOMETRY N/A 09/03/2012   Procedure: ESOPHAGEAL MANOMETRY (EM);  Surgeon: Garlan Fair, MD;  Location: [facility];  Service: Endoscopy;  Laterality: N/A;   ESOPHAGEAL MANOMETRY N/A 06/19/2017   Procedure: ESOPHAGEAL MANOMETRY (EM);  Surgeon: Clarene Essex, MD;  Location: [facility];  Service:  Endoscopy;  Laterality: N/A;   EXPLORATORY LAPAROTOMY     GANGLION CYST EXCISION     JOINT REPLACEMENT  2011   rt total hip   KNEE ARTHROSCOPY     PILONIDAL CYST / SINUS EXCISION     TOTAL HIP ARTHROPLASTY Left 10/09/2013   Procedure: LEFT TOTAL HIP ARTHROPLASTY ANTERIOR APPROACH;  Surgeon: [name], MD;  Location: [facility];  Service: Orthopedics;  Laterality: Left;   Patient Active Problem List   Diagnosis Date Noted   Allergic conjunctivitis of both eyes 07/06/2020   Gait abnormality 11/18/2019   OSA on CPAP 06/04/2019   Pain in joint of left shoulder 03/28/2019   Seasonal allergic conjunctivitis 02/07/2019   Chronic intermittent hypoxia with obstructive sleep apnea 01/25/2019   Severe obstructive sleep apnea 01/25/2019   Excessive daytime sleepiness 01/03/2019   Non-restorative sleep 01/03/2019   Nocturia more than twice per night 01/03/2019   Weight gain, abnormal 01/03/2019   Snorings 01/03/2019   Trochanteric bursitis of right hip 04/27/2018   Encounter for routine screening for malformation using ultrasonics 02/08/2018  Abnormal weight gain 02/08/2018   Acid indigestion 02/08/2018   Acute antritis 02/08/2018   Class 2 severe obesity with serious comorbidity and body mass index (BMI) of 39.0 to 39.9 in adult ([place]) 02/08/2018   Perennial allergic rhinitis 02/08/2018   Antiphospholipid syndrome ([place]) 02/08/2018   Arthralgia of hip or thigh 02/08/2018   Asthma, cough variant 02/08/2018   Vitamin D deficiency 02/08/2018   Backhand tennis elbow 02/08/2018   Cannot sleep 02/08/2018   Chronic kidney disease (CKD), stage III (moderate) ([place]) 02/08/2018   Current drug use 02/08/2018   Depression, major, single episode, in partial remission ([place]) 02/08/2018   Diabetes mellitus with polyneuropathy ([place]) 02/08/2018   Difficulty hearing 02/08/2018   Disturbance of skin sensation 02/08/2018   Hypertension associated with diabetes ([place]) 02/08/2018   Extremity pain 02/08/2018    Facet syndrome, lumbar 02/08/2018   Factor V Leiden ([place]) 02/08/2018   Fungal infection of nail 02/08/2018   Gastroenteritis and colitis, viral 02/08/2018   Hypoglycemia 02/08/2018   Menopause 02/08/2018   Other long term (current) drug therapy 02/08/2018   Other osteonecrosis, unspecified femur ([place]) 02/08/2018   Pure hypercholesterolemia 02/08/2018   Recurrent major depressive episodes ([place]) 02/08/2018   Syncope and collapse 02/08/2018   Not well controlled moderate persistent asthma 02/08/2018   Cough, persistent 02/08/2018   Restless leg syndrome 01/15/2018   Degeneration of lumbar intervertebral disc 11/21/2017   Tremor of right hand 03/02/2017   Parkinson disease 03/02/2017   UTI (urinary tract infection) 12/01/2015   Type 2 diabetes mellitus with stage 3b chronic kidney disease, with long-term current use of insulin ([place]) 06/03/2015   Fast heart beat 04/09/2015   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 2 diabetes mellitus ([place]) 04/24/2014   Postoperative anemia due to acute blood loss 10/10/2013   Avascular necrosis of bone of left hip ([place]) 10/09/2013   Arthritis of pelvic region, degenerative 10/09/2013   Breath shortness 06/18/2013   Diabetes mellitus type 2, uncontrolled 05/31/2013   Intermittent claudication ([place]) 04/04/2013   Anxiety    Gastroesophageal reflux disease 09/12/2012   Clinical depression 02/21/2012   Stroke ([place]) 02/11/2012   History of revision of total replacement of right hip joint 04/04/2009    ONSET DATE: 12/02/2021   REFERRING DIAG: G20 (ICD-10-CM) - Parkinson disease ([place]) R29.6 (ICD-10-CM) - Falls frequently   THERAPY DIAG:  Muscle weakness (generalized)  Other abnormalities of gait and mobility  Abnormal posture  Rationale for Evaluation and Treatment Rehabilitation  SUBJECTIVE:  SUBJECTIVE STATEMENT: Pt reports she is doing "awful". R foot is really bothering her and she did not take pain meds prior to session. No falls.   Pt accompanied by: self  PERTINENT HISTORY: Parkinsonism (2018),TIA, thyroid disease, kidney disease, HTN, HLD, GERD, DMII, depression, L hip avascular necrosis, anxiety, anemia, R THA 2011, L THA 2015, knee arthroscopy   PAIN:  Are you having pain? Yes: NPRS scale: 10/10 Pain location: R heel Pain description: Sharp Aggravating factors: Standing Relieving factors: Sitting, Icing, pain medication   PRECAUTIONS: Fall, No driving.   FALLS: Has patient fallen in last 6 months? Yes. Number of falls 10  VITALS There were no vitals filed for this visit.   PLOF: Independent with community mobility with device  PATIENT GOALS Wants to stop falling, improve balance.    OBJECTIVE:   COGNITION: Overall cognitive status: Impaired  TODAY'S TREATMENT:  Ther Act  Reviewed remainder of pt's HEP verbally (pt in too much pain to practice exercises in clinic) and organized pt's folder to clearly separate PT and OT exercises and dispose of repetitive handouts. Pt and husband verbalized good understanding of each exercise   Ther Ex  SciFit multi-peaks level 8 for 10 minutes using BUE/BLEs for neural priming for reciprocal movement, dynamic cardiovascular warmup and increased amplitude of stepping. RPE of 3/10 following activity    GAIT: Gait pattern:  genu valgus of RLE, step through pattern, decreased stride length, Right foot flat, Left foot flat, decreased trunk rotation, trunk flexed, and narrow BOS Distance walked: Clinic distances. Assistive device utilized: Environmental consultant - 4 wheeled Level of assistance: SBA Comments: Cues to stay inside the rollator at all times. Pt able to correctly sequence how to back up to mat/chair and lock/unlock brakes if allowed extra initiation time. Provided knowledge of results feedback throughout. Pt did  have single instance of standing up from chair and turning to sit in rollator rather than walk to mat table. Pt unsure why she chose to sit on rollator.    PATIENT EDUCATION: Education details: Reviewed HEP, importance of taking pain meds prior to therapy so pt can tolerate session  Person educated: Patient and Spouse Education method: Explanation, Demonstration, and Handouts Education comprehension: verbalized understanding and returned demonstration   HOME EXERCISE PROGRAM: Access Code: [email or web address] URL: [email or web address] Date: 12/22/2021 Prepared by: Mickie Bail Machele Deihl  Exercises - Sit to Stand Without Arm Support  - 1 x daily - 7 x weekly - 2 sets - 5-6 reps - Heel Raises with Counter Support  - 1 x daily - 7 x weekly - 3 sets - 10 reps - Step sideways with arms reaching at counter   - 1 x daily - 7 x weekly - 3 sets - 10 reps (same as PWR step) - Alternating Step Backward with Support  - 1 x daily - 7 x weekly - 3 sets - 10 reps - Standing Quarter Turn with Counter Support  - 1 x daily - 7 x weekly - 3 sets - 10 reps - Seated Hamstring Stretch  - 1 x daily - 7 x weekly - 1 sets - 3-4 reps - 30 second hold - Calf stretch  - 1 x daily - 7 x weekly - 1 sets - 3-4 reps - 30-45 second hold   Standing PWR moves (with supervision from husband and having chair in front of pt)   GOALS: Goals reviewed with patient? Yes   UPDATED/ONGOING LTGS FOR RE-CERT LONG TERM GOALS: Target date: 04/01/22 (UPDATED DATE)  Pt  will be independent with final HEP for PD specific deficits/balance in order to build upon functional gains made in therapy. Baseline: pt performing at home, will benefit from updates/additions  Goal status: ON-GOING  2.  Pt perform L lateral stepping strategy in 1-2 steps in order to demo improved balance recovery.  Baseline: 2-3 steps on 03/15/22 Goal status: NEW  3.  Pt will improve gait speed with rollator to at least 2.4 ft/sec in order to demo improved  community mobility.  Baseline: 15.8 seconds with rollator = 2.06 ft/sec, pt only using cane now when going up and down the stairs  Goal status: NEW  4.  Pt will improve 5x sit<>stand to less than or equal to 16 sec with no UE support from lower surfaces to demonstrate improved functional strength and transfer efficiency.  Baseline: 18.5 seconds with no UE support from lower chair  Goal status: NEW  5.  Pt will perform at least 12 steps with use of cane with proper sequencing with mod I in order to demo improved safety with stairs at home.  Baseline: supervision/CGA Goal status: NEW  ASSESSMENT:  CLINICAL IMPRESSION: Session limited due to pt's high pain levels in her R foot. Emphasis of skilled PT session on reviewing remainder of HEP to prepare for DC in a few weeks. Pt unable to physically review HEP today due to pain but pt and husband verbalized good understanding of each exercises without questions. OT and PT in agreement to schedule PD evals in 38mo Continue POC.   OBJECTIVE IMPAIRMENTS Abnormal gait, decreased activity tolerance, decreased balance, decreased coordination, difficulty walking, decreased strength, impaired flexibility, impaired tone, and postural dysfunction.   ACTIVITY LIMITATIONS bending, squatting, stairs, transfers, and locomotion level  PARTICIPATION LIMITATIONS: driving and community activity  PERSONAL FACTORS Behavior pattern, Past/current experiences, Time since onset of injury/illness/exacerbation, and 3+ comorbidities: Parkinsonism (2018),TIA, thyroid disease, kidney disease, HTN, HLD, GERD, DMII, depression, L hip avascular necrosis, anxiety, anemia, R THA 2011, L THA 2015, knee arthroscopy   are also affecting patient's functional outcome.   REHAB POTENTIAL: Good  CLINICAL DECISION MAKING: Evolving/moderate complexity  EVALUATION COMPLEXITY: Moderate  PLAN: PT FREQUENCY: 2x/week  PT DURATION: 8 weeks  PLANNED INTERVENTIONS: Therapeutic exercises,  Therapeutic activity, Neuromuscular re-education, Balance training, Gait training, Self Care, Stair training, Vestibular training, DME instructions, Manual therapy, and Re-evaluation  PLAN FOR NEXT SESSION:  Start assessing goals/preparing for DC. Continue with stepping strategies (posterior and L lateral). Sit to stands from lower surfaces (like getting up from recliner)functional strength. Work on sIT trainer Scifit for endurance,forward lunge over beam, reaching out of BOS, resisted walking.  JCruzita LedererPlaster, PT, DPT  03/24/2022, 11:49 AM

## 2022-03-24 NOTE — Therapy (Signed)
OUTPATIENT OCCUPATIONAL THERAPY [facility]   Patient Name: Deanna Rowe MRN: [id number] DOB:08/07/1955, 66 y.o., female Today's Date: 03/24/2022  PCP: Dr. Radene Ou REFERRING PROVIDER: Ward Givens NP   OT End of Session - 03/24/22 1025     Visit Number 14    Number of Visits 25    Date for OT Re-Evaluation 03/17/22    Authorization Type [facility], MCR secondary    Authorization - Visit Number [id number]    OT Start Time 1019    OT Stop Time 1100    OT Time Calculation (min) 41 min                 Past Medical History:  Diagnosis Date   Anemia    Anxiety    Arthritis    L HIP   Asthma    Avascular necrosis of hip ([place])    LEFT   Back pain    Chest pain    Constipation    Depression    Diabetes mellitus    Diabetes mellitus, type II ([place])    Dry cough    Edema, lower extremity    Gait abnormality 11/18/2019   GERD (gastroesophageal reflux disease)    High cholesterol    Hypertension    Insomnia    takes Ambien nightly   Joint pain    Kidney disease    Kidney disease    Obesity    Parkinsonism 03/02/2017   PONV (postoperative nausea and vomiting)    RLS (restless legs syndrome) 01/15/2018   Sleep apnea    Swallowing difficulty    Thyroid disease    TIA (transient ischemic attack) 2012   no residual problems   Urgency incontinence    Vitamin D deficiency    Past Surgical History:  Procedure Laterality Date   DILATION AND CURETTAGE OF UTERUS     ESOPHAGEAL MANOMETRY N/A 09/03/2012   Procedure: ESOPHAGEAL MANOMETRY (EM);  Surgeon: Garlan Fair, MD;  [facility];  Service: Endoscopy;  Laterality: N/A;   ESOPHAGEAL MANOMETRY N/A 06/19/2017   Procedure: ESOPHAGEAL MANOMETRY (EM);  Surgeon: Clarene Essex, MD;  [facility];  Service: Endoscopy;  Laterality: N/A;   EXPLORATORY LAPAROTOMY     GANGLION CYST EXCISION     JOINT REPLACEMENT  2011   rt total hip   KNEE ARTHROSCOPY     PILONIDAL CYST / SINUS EXCISION      TOTAL HIP ARTHROPLASTY Left 10/09/2013   Procedure: LEFT TOTAL HIP ARTHROPLASTY ANTERIOR APPROACH;  Surgeon: [name], MD;  [facility];  Service: Orthopedics;  Laterality: Left;   Patient Active Problem List   Diagnosis Date Noted   Allergic conjunctivitis of both eyes 07/06/2020   Gait abnormality 11/18/2019   OSA on CPAP 06/04/2019   Pain in joint of left shoulder 03/28/2019   Seasonal allergic conjunctivitis 02/07/2019   Chronic intermittent hypoxia with obstructive sleep apnea 01/25/2019   Severe obstructive sleep apnea 01/25/2019   Excessive daytime sleepiness 01/03/2019   Non-restorative sleep 01/03/2019   Nocturia more than twice per night 01/03/2019   Weight gain, abnormal 01/03/2019   Snorings 01/03/2019   Trochanteric bursitis of right hip 04/27/2018   Encounter for routine screening for malformation using ultrasonics 02/08/2018   Abnormal weight gain 02/08/2018   Acid indigestion 02/08/2018   Acute antritis 02/08/2018   Class 2 severe obesity with serious comorbidity and body mass index (BMI) of 39.0 to 39.9 in adult [facility]) 02/08/2018   Perennial allergic  rhinitis 02/08/2018   Antiphospholipid syndrome (HCC) 02/08/2018   Arthralgia of hip or thigh 02/08/2018   Asthma, cough variant 02/08/2018   Vitamin D deficiency 02/08/2018   Backhand tennis elbow 02/08/2018   Cannot sleep 02/08/2018   Chronic kidney disease (CKD), stage III (moderate) ([place]) 02/08/2018   Current drug use 02/08/2018   Depression, major, single episode, in partial remission ([place]) 02/08/2018   Diabetes mellitus with polyneuropathy ([place]) 02/08/2018   Difficulty hearing 02/08/2018   Disturbance of skin sensation 02/08/2018   Hypertension associated with diabetes ([place]) 02/08/2018   Extremity pain 02/08/2018   Facet syndrome, lumbar 02/08/2018   Factor V Leiden ([place]) 02/08/2018   Fungal infection of nail 02/08/2018   Gastroenteritis and colitis, viral 02/08/2018   Hypoglycemia 02/08/2018    Menopause 02/08/2018   Other long term (current) drug therapy 02/08/2018   Other osteonecrosis, unspecified femur ([place]) 02/08/2018   Pure hypercholesterolemia 02/08/2018   Recurrent major depressive episodes ([place]) 02/08/2018   Syncope and collapse 02/08/2018   Not well controlled moderate persistent asthma 02/08/2018   Cough, persistent 02/08/2018   Restless leg syndrome 01/15/2018   Degeneration of lumbar intervertebral disc 11/21/2017   Tremor of right hand 03/02/2017   Parkinson disease 03/02/2017   UTI (urinary tract infection) 12/01/2015   Type 2 diabetes mellitus with stage 3b chronic kidney disease, with long-term current use of insulin ([place]) 06/03/2015   Fast heart beat 04/09/2015   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 2 diabetes mellitus ([place]) 04/24/2014   Postoperative anemia due to acute blood loss 10/10/2013   Avascular necrosis of bone of left hip ([place]) 10/09/2013   Arthritis of pelvic region, degenerative 10/09/2013   Breath shortness 06/18/2013   Diabetes mellitus type 2, uncontrolled 05/31/2013   Intermittent claudication ([place]) 04/04/2013   Anxiety    Gastroesophageal reflux disease 09/12/2012   Clinical depression 02/21/2012   Stroke ([place]) 02/11/2012   History of revision of total replacement of right hip joint 04/04/2009    ONSET DATE: 12/15/21  REFERRING DIAG: Parkinsonism  THERAPY DIAG:  Muscle weakness (generalized)  Other lack of coordination  Abnormal posture  Other abnormalities of gait and mobility  Frontal lobe and executive function deficit  Attention and concentration deficit  Unsteadiness on feet  Rationale for Evaluation and Treatment Rehabilitation  SUBJECTIVE:   SUBJECTIVE STATEMENT: Pt reports she continues to have severe foot pain Pt accompanied by: self, husband  PERTINENT HISTORY: PERTINENT HISTORY: Parkinsonism (2018),TIA, thyroid disease, kidney disease, HTN, HLD, GERD, DMII, depression, L hip avascular necrosis, anxiety, anemia, R THA  2011, L THA 2015, knee arthroscopy   PRECAUTIONS: Fall  WEIGHT BEARING RESTRICTIONS No  PAIN:  Are you having pain? Yes: NPRS scale: 10/10 Pain location: right foot Pain description: aching Aggravating factors: plantar fascitis, standing Relieving factors: ice    FALLS: Has patient fallen in last 6 months? Yes. Number of falls 5  LIVING ENVIRONMENT: Lives with: lives with their spouse Lives in: House/apartment  PLOF: Independent  PATIENT GOALS to be able style hair  OBJECTIVE: from eval   HAND DOMINANCE: Right   FUNCTIONAL OUTCOME MEASURES: Fastening/unfastening 3 buttons: 50.68 Physical performance test: PPT#2 (simulated eating) 15.03 & PPT#4 (donning/doffing jacket): 13.34  COORDINATION: 9 Hole Peg test: Right: 36.91 sec; Left: 38.16 sec Box and Blocks:  Right 33blocks, Left 37blocks   TODAY'S TREATMENT:  Pt brought in her exercises from home. Therapist worked with pt/ husband on organizing her exercises, removing duplicates and pt was provided with an exercise flow sheet.  Reviewed flipping and dealing cards and stacking manipulating pennies from HEP, pt demonstrates mod-max difficulty, mod v.c and demonstration, increased time required therefore 1 additional OT visit was added for next week     PATIENT EDUCATION: Education details: Exercise flow sheet,reviewed flipping and dealing cards and stacking manipulating pennies from HEP Person educated: Patient and Spouse Education method: Explanation,  demonstration, verbal cues, handouts Education comprehension: verbalized understanding, returned demo, verbal cues required   [place]: 12/27/21: PWR! Seated, coordination HEP, writing strategies 12/30/21: PWR! Hands  02/03/22- PWR! Supine basic 4 02/15/22- recommendation for staggered feet and use of rollator for laundry, education regarding pt visual deficits, inability to track inferiorly. 03/08/22: Bag ex's for ADLS   ASSESSMENT:  CLINICAL  IMPRESSION: Pt is progressing towards goals. Pt will benefit from reinforcement of coordination HEP. Pt continues to be limited by cognitive and visual deficits. PERFORMANCE DEFICITS in functional skills including ADLs, IADLs, coordination, dexterity, proprioception, sensation, tone, ROM, strength, pain, flexibility, FMC, GMC, mobility, balance, endurance, decreased knowledge of precautions, decreased knowledge of use of DME, and UE functional use, bradykinesia , cognitive skills including attention, energy/drive, learn, memory, problem solving, safety awareness, thought, and understand, and psychosocial skills including coping strategies, environmental adaptation, habits, interpersonal interactions, and routines and behaviors.   IMPAIRMENTS are limiting patient from ADLs, IADLs, play, leisure, and social participation.   COMORBIDITIES may have co-morbidities  that affects occupational performance. Patient will benefit from skilled OT to address above impairments and improve overall function.  MODIFICATION OR ASSISTANCE TO COMPLETE EVALUATION: No modification of tasks or assist necessary to complete an evaluation.  OT OCCUPATIONAL PROFILE AND HISTORY: Detailed assessment: Review of records and additional review of physical, cognitive, psychosocial history related to current functional performance.  CLINICAL DECISION MAKING: LOW - limited treatment options, no task modification necessary  REHAB POTENTIAL: Good  EVALUATION COMPLEXITY: Low     GOALS: Goals reviewed with patient? No  SHORT TERM GOALS: Target date: 02/21/22- extended due to scheduling  I with PD specific HEP Baseline: Goal status: MET (w/ caregiver cues)   2.  Pt will verbalize understanding of adapted strategies to maximize safety and I with ADLs/ IADLs .  Baseline:  Goal status: MET  3.  Pt will demonstrate improved bilateral UE functional use as evidenced by increasing box/ blocks score by 3 blocks Baseline: RUE 33,  LUE 37 Goal status: MET (Rt = 38, Lt = 40)   4.  Pt will demonstrate improved ease with feeding as evidenced by decreasing PPT#2 (self feeding) by 3 secs Baseline: 15.03 Goal status: MET (9.06 sec)   5.  Pt will report increased ease with styling her hair Baseline: Pt reports difficulty Goal status: met, per pt report    LONG TERM GOALS: Target date: 03/17/22-  1.Pt will verbalize understanding of ways to prevent future PD related complications and PD community resources.  Baseline:  Goal status:met, PT provided community resources, therapist provided education regarding preventing future PD complications.  2.  Pt will demonstrate improved fine motor coordination for ADLs as evidenced by decreasing bilateral  9 hole peg test score for by 3 secs  Baseline: RUE 36.91, LUE 38.16 Goal status: ongoing for RUE 35.53 , LUE 34.78- met for LUE only  3.  Pt will demonstrate understanding of memory compensations and ways to keep thinking skills sharp  Baseline:  Goal status: met 4.  Pt will demonstrate improved ease with fastening buttons as evidenced by decreasing 3 button/ unbutton time to 47 secs  or less  Baseline: 50.68 Goal status:  met 42.66   PLAN: OT FREQUENCY: 2x/week  OT DURATION: 4 weeks  PLANNED INTERVENTIONS: self care/ADL training, therapeutic exercise, therapeutic activity, neuromuscular re-education, manual therapy, passive range of motion, balance training, stair training, functional mobility training, aquatic therapy, ultrasound, paraffin, fluidotherapy, moist heat, cryotherapy, contrast bath, patient/family education, cognitive remediation/compensation, visual/perceptual remediation/compensation, energy conservation, coping strategies training, DME and/or AE instructions, and Re-evaluation  RECOMMENDED OTHER SERVICES: PT  CONSULTED AND AGREED WITH PLAN OF CARE: Patient  PLAN FOR NEXT SESSION: review/ reinforce  coordination HEP, anticipate  next visit   Theone Murdoch, OTR/L [phone] Phone: [phone] 10:26 AM 03/24/22  03/24/22 10:26 AM Phone [phone] FAX [phone]

## 2022-03-29 ENCOUNTER — Ambulatory Visit: Payer: 59 | Admitting: Occupational Therapy

## 2022-03-29 ENCOUNTER — Encounter: Payer: Self-pay | Admitting: Occupational Therapy

## 2022-03-29 ENCOUNTER — Other Ambulatory Visit (HOSPITAL_BASED_OUTPATIENT_CLINIC_OR_DEPARTMENT_OTHER): Payer: Self-pay

## 2022-03-29 DIAGNOSIS — R278 Other lack of coordination: Secondary | ICD-10-CM | POA: Diagnosis not present

## 2022-03-29 DIAGNOSIS — R29818 Other symptoms and signs involving the nervous system: Secondary | ICD-10-CM

## 2022-03-29 DIAGNOSIS — R2681 Unsteadiness on feet: Secondary | ICD-10-CM | POA: Diagnosis not present

## 2022-03-29 DIAGNOSIS — R41840 Attention and concentration deficit: Secondary | ICD-10-CM

## 2022-03-29 DIAGNOSIS — R293 Abnormal posture: Secondary | ICD-10-CM

## 2022-03-29 DIAGNOSIS — R41844 Frontal lobe and executive function deficit: Secondary | ICD-10-CM

## 2022-03-29 DIAGNOSIS — M6281 Muscle weakness (generalized): Secondary | ICD-10-CM

## 2022-03-29 DIAGNOSIS — R2689 Other abnormalities of gait and mobility: Secondary | ICD-10-CM

## 2022-03-29 NOTE — Therapy (Signed)
OUTPATIENT OCCUPATIONAL THERAPY [facility]   Patient Name: Deanna Rowe MRN: [id number] DOB:Sep 28, 1955, 66 y.o., female Today's Date: 03/29/2022  PCP: Dr. Radene Ou REFERRING PROVIDER: Ward Givens NP   OT End of Session - 03/29/22 1457     Visit Number 15    Number of Visits 25    Date for OT Re-Evaluation 04/14/22    Authorization Type [facility], MCR secondary    OT Start Time 1451    OT Stop Time 1530    OT Time Calculation (min) 39 min    Activity Tolerance Patient tolerated treatment well    Behavior During Therapy WFL for tasks assessed/performed                  Past Medical History:  Diagnosis Date   Anemia    Anxiety    Arthritis    L HIP   Asthma    Avascular necrosis of hip ([place])    LEFT   Back pain    Chest pain    Constipation    Depression    Diabetes mellitus    Diabetes mellitus, type II (HCC)    Dry cough    Edema, lower extremity    Gait abnormality 11/18/2019   GERD (gastroesophageal reflux disease)    High cholesterol    Hypertension    Insomnia    takes Ambien nightly   Joint pain    Kidney disease    Kidney disease    Obesity    Parkinsonism 03/02/2017   PONV (postoperative nausea and vomiting)    RLS (restless legs syndrome) 01/15/2018   Sleep apnea    Swallowing difficulty    Thyroid disease    TIA (transient ischemic attack) 2012   no residual problems   Urgency incontinence    Vitamin D deficiency    Past Surgical History:  Procedure Laterality Date   DILATION AND CURETTAGE OF UTERUS     ESOPHAGEAL MANOMETRY N/A 09/03/2012   Procedure: ESOPHAGEAL MANOMETRY (EM);  Surgeon: Garlan Fair, MD;  Location: [facility];  Service: Endoscopy;  Laterality: N/A;   ESOPHAGEAL MANOMETRY N/A 06/19/2017   Procedure: ESOPHAGEAL MANOMETRY (EM);  Surgeon: Clarene Essex, MD;  Location: [facility];  Service: Endoscopy;  Laterality: N/A;   EXPLORATORY LAPAROTOMY     GANGLION CYST EXCISION     JOINT REPLACEMENT  2011    rt total hip   KNEE ARTHROSCOPY     PILONIDAL CYST / SINUS EXCISION     TOTAL HIP ARTHROPLASTY Left 10/09/2013   Procedure: LEFT TOTAL HIP ARTHROPLASTY ANTERIOR APPROACH;  Surgeon: [name], MD;  Location: [facility];  Service: Orthopedics;  Laterality: Left;   Patient Active Problem List   Diagnosis Date Noted   Allergic conjunctivitis of both eyes 07/06/2020   Gait abnormality 11/18/2019   OSA on CPAP 06/04/2019   Pain in joint of left shoulder 03/28/2019   Seasonal allergic conjunctivitis 02/07/2019   Chronic intermittent hypoxia with obstructive sleep apnea 01/25/2019   Severe obstructive sleep apnea 01/25/2019   Excessive daytime sleepiness 01/03/2019   Non-restorative sleep 01/03/2019   Nocturia more than twice per night 01/03/2019   Weight gain, abnormal 01/03/2019   Snorings 01/03/2019   Trochanteric bursitis of right hip 04/27/2018   Encounter for routine screening for malformation using ultrasonics 02/08/2018   Abnormal weight gain 02/08/2018   Acid indigestion 02/08/2018   Acute antritis 02/08/2018   Class 2 severe obesity with serious comorbidity and body mass index (BMI)  of 39.0 to 39.9 in adult [facility]) 02/08/2018   Perennial allergic rhinitis 02/08/2018   Antiphospholipid syndrome ([place]) 02/08/2018   Arthralgia of hip or thigh 02/08/2018   Asthma, cough variant 02/08/2018   Vitamin D deficiency 02/08/2018   Backhand tennis elbow 02/08/2018   Cannot sleep 02/08/2018   Chronic kidney disease (CKD), stage III (moderate) ([place]) 02/08/2018   Current drug use 02/08/2018   Depression, major, single episode, in partial remission ([place]) 02/08/2018   Diabetes mellitus with polyneuropathy ([place]) 02/08/2018   Difficulty hearing 02/08/2018   Disturbance of skin sensation 02/08/2018   Hypertension associated with diabetes ([place]) 02/08/2018   Extremity pain 02/08/2018   Facet syndrome, lumbar 02/08/2018   Factor V Leiden ([place]) 02/08/2018   Fungal infection of nail 02/08/2018    Gastroenteritis and colitis, viral 02/08/2018   Hypoglycemia 02/08/2018   Menopause 02/08/2018   Other long term (current) drug therapy 02/08/2018   Other osteonecrosis, unspecified femur ([place]) 02/08/2018   Pure hypercholesterolemia 02/08/2018   Recurrent major depressive episodes ([place]) 02/08/2018   Syncope and collapse 02/08/2018   Not well controlled moderate persistent asthma 02/08/2018   Cough, persistent 02/08/2018   Restless leg syndrome 01/15/2018   Degeneration of lumbar intervertebral disc 11/21/2017   Tremor of right hand 03/02/2017   Parkinson disease 03/02/2017   UTI (urinary tract infection) 12/01/2015   Type 2 diabetes mellitus with stage 3b chronic kidney disease, with long-term current use of insulin ([place]) 06/03/2015   Fast heart beat 04/09/2015   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 2 diabetes mellitus ([place]) 04/24/2014   Postoperative anemia due to acute blood loss 10/10/2013   Avascular necrosis of bone of left hip ([place]) 10/09/2013   Arthritis of pelvic region, degenerative 10/09/2013   Breath shortness 06/18/2013   Diabetes mellitus type 2, uncontrolled 05/31/2013   Intermittent claudication ([place]) 04/04/2013   Anxiety    Gastroesophageal reflux disease 09/12/2012   Clinical depression 02/21/2012   Stroke ([place]) 02/11/2012   History of revision of total replacement of right hip joint 04/04/2009    ONSET DATE: 12/15/21  REFERRING DIAG: Parkinsonism  THERAPY DIAG:  Muscle weakness (generalized)  Other abnormalities of gait and mobility  Abnormal posture  Other lack of coordination  Frontal lobe and executive function deficit  Attention and concentration deficit  Unsteadiness on feet  Other symptoms and signs involving the nervous system  Rationale for Evaluation and Treatment Rehabilitation  SUBJECTIVE:   SUBJECTIVE STATEMENT: Pt's husband reports she slept most of the day yesterday Pt accompanied by: self, husband  PERTINENT HISTORY: PERTINENT HISTORY:  Parkinsonism (2018),TIA, thyroid disease, kidney disease, HTN, HLD, GERD, DMII, depression, L hip avascular necrosis, anxiety, anemia, R THA 2011, L THA 2015, knee arthroscopy   PRECAUTIONS: Fall  WEIGHT BEARING RESTRICTIONS No  PAIN: no reports of pain today   FALLS: Has patient fallen in last 6 months? Yes. Number of falls 5  LIVING ENVIRONMENT: Lives with: lives with their spouse Lives in: House/apartment  PLOF: Independent  PATIENT GOALS to be able style hair  OBJECTIVE: from eval   HAND DOMINANCE: Right   FUNCTIONAL OUTCOME MEASURES: Fastening/unfastening 3 buttons: 50.68 Physical performance test: PPT#2 (simulated eating) 15.03 & PPT#4 (donning/doffing jacket): 13.34  COORDINATION: 9 Hole Peg test: Right: 36.91 sec; Left: 38.16 sec Box and Blocks:  Right 33blocks, Left 37blocks   TODAY'S TREATMENT:  Pt arrived today accompanied by her husband. Pt has had a significant decline since last week. She is lethargic, confused, tearful and hallucinating. Pt with a  freezing episode as she was backing up to chair, mod v.c and facilitation to safely sit. Pt's husband was encouraged to go over to neurology office and schedule an appointment. Pt is seeing Dr. Rexene Alberts next Wednesday. Pt's husband was also encouraged to double check pt's medications to ensure that she has not taken wrong dosages. Therapist recommends that pt has supervision/ assistance whenever she is up an does not stay by herself. Funcional reaching with left and right UE's to place large and medium pegs into pegboard with right and left UE's, mod v.c for PWR! Hands. See education belwo. Therapist assisted pt into her car, min A and mod v.c      PATIENT EDUCATION: Education details: strategies for freezing episodes Person educated: Patient and Spouse Education method: Explanation,  demonstration, verbal cues, handouts Education comprehension: verbalized understanding, returned demo, verbal cues  required   HOME EXERCISE PROGRAM: 12/27/21: PWR! Seated, coordination HEP, writing strategies 12/30/21: PWR! Hands  02/03/22- PWR! Supine basic 4 02/15/22- recommendation for staggered feet and use of rollator for laundry, education regarding pt visual deficits, inability to track inferiorly. 03/08/22: Bag ex's for ADLS   ASSESSMENT:  CLINICAL IMPRESSION: Pt is progressing towards goals. Pt will benefit from reinforcement of coordination HEP. Pt continues to be limited by cognitive and visual deficits. PERFORMANCE DEFICITS in functional skills including ADLs, IADLs, coordination, dexterity, proprioception, sensation, tone, ROM, strength, pain, flexibility, FMC, GMC, mobility, balance, endurance, decreased knowledge of precautions, decreased knowledge of use of DME, and UE functional use, bradykinesia , cognitive skills including attention, energy/drive, learn, memory, problem solving, safety awareness, thought, and understand, and psychosocial skills including coping strategies, environmental adaptation, habits, interpersonal interactions, and routines and behaviors.   IMPAIRMENTS are limiting patient from ADLs, IADLs, play, leisure, and social participation.   COMORBIDITIES may have co-morbidities  that affects occupational performance. Patient will benefit from skilled OT to address above impairments and improve overall function.  MODIFICATION OR ASSISTANCE TO COMPLETE EVALUATION: No modification of tasks or assist necessary to complete an evaluation.  OT OCCUPATIONAL PROFILE AND HISTORY: Detailed assessment: Review of records and additional review of physical, cognitive, psychosocial history related to current functional performance.  CLINICAL DECISION MAKING: LOW - limited treatment options, no task modification necessary  REHAB POTENTIAL: Good  EVALUATION COMPLEXITY: Low     GOALS: Goals reviewed with patient? No  SHORT TERM GOALS: Target date: 02/21/22- extended due to  scheduling  I with PD specific HEP Baseline: Goal status: MET (w/ caregiver cues)   2.  Pt will verbalize understanding of adapted strategies to maximize safety and I with ADLs/ IADLs .  Baseline:  Goal status: MET  3.  Pt will demonstrate improved bilateral UE functional use as evidenced by increasing box/ blocks score by 3 blocks Baseline: RUE 33, LUE 37 Goal status: MET (Rt = 38, Lt = 40)   4.  Pt will demonstrate improved ease with feeding as evidenced by decreasing PPT#2 (self feeding) by 3 secs Baseline: 15.03 Goal status: MET (9.06 sec)   5.  Pt will report increased ease with styling her hair Baseline: Pt reports difficulty Goal status: met, per pt report    LONG TERM GOALS: Target date: 03/17/22-  1.Pt will verbalize understanding of ways to prevent future PD related complications and PD community resources.  Baseline:  Goal status:met, PT provided community resources, therapist provided education regarding preventing future PD complications.  2.  Pt will demonstrate improved fine motor coordination for ADLs as evidenced by decreasing bilateral  9 hole peg test score for by 3 secs  Baseline: RUE 36.91, LUE 38.16 Goal status: ongoing for RUE 35.53 , LUE 34.78- met for LUE only  3.  Pt will demonstrate understanding of memory compensations and ways to keep thinking skills sharp  Baseline:  Goal status: met 4.  Pt will demonstrate improved ease with fastening buttons as evidenced by decreasing 3 button/ unbutton time to 47 secs or less  Baseline: 50.68 Goal status:  met 42.66   PLAN: OT FREQUENCY: 2x/week  OT DURATION: 4 weeks  PLANNED INTERVENTIONS: self care/ADL training, therapeutic exercise, therapeutic activity, neuromuscular re-education, manual therapy, passive range of motion, balance training, stair training, functional mobility training, aquatic therapy, ultrasound, paraffin, fluidotherapy, moist heat, cryotherapy, contrast bath, patient/family  education, cognitive remediation/compensation, visual/perceptual remediation/compensation, energy conservation, coping strategies training, DME and/or AE instructions, and Re-evaluation  RECOMMENDED OTHER SERVICES: PT  CONSULTED AND AGREED WITH PLAN OF CARE: Patient  PLAN FOR NEXT SESSION: continue education regarding safety for freezing episodes with practice, education regarding visual deficits.   Theone Murdoch, OTR/L [phone] Phone: [phone] 3:45 PM 03/29/22  03/29/22 3:45 PM Phone [phone] FAX [phone]

## 2022-03-29 NOTE — Patient Instructions (Signed)
Tips to reduce freezing episodes with standing or walking:  Stand tall with your feet wide, so that you can rock and weight shift through your hips. Don't try to fight the freeze: if you begin taking slower, faster, smaller steps, STOP, get your posture tall, and RESET your posture and balance.  Take a deep breath before taking the BIG step to start again. March in place, with high knee stepping, to get started walking again. Use auditory cues:  Count out loud, think of a familiar tune or song or cadence, use pocket metronome, to use rhythm to get started walking again. Use visual cues:  Use a line to step over, use laser pointer line to step over, (using BIG steps) to start walking again. Use visual targets to keep your posture tall (look ahead and focus on an object or target at eye level). As you approach where your destination with walking, count your steps out loud and/or focus on your target with your eyes until you are fully there. Use appropriate assistive device, as advised by your physical therapist to assist with taking longer, consistent steps.  

## 2022-03-30 ENCOUNTER — Other Ambulatory Visit (HOSPITAL_BASED_OUTPATIENT_CLINIC_OR_DEPARTMENT_OTHER): Payer: Self-pay

## 2022-03-30 ENCOUNTER — Other Ambulatory Visit: Payer: Self-pay

## 2022-03-30 ENCOUNTER — Telehealth: Payer: Self-pay | Admitting: Adult Health

## 2022-03-30 ENCOUNTER — Ambulatory Visit: Payer: 59 | Admitting: Physical Therapy

## 2022-03-30 VITALS — BP 144/76 | HR 86

## 2022-03-30 DIAGNOSIS — R5383 Other fatigue: Secondary | ICD-10-CM | POA: Diagnosis not present

## 2022-03-30 DIAGNOSIS — R2689 Other abnormalities of gait and mobility: Secondary | ICD-10-CM

## 2022-03-30 DIAGNOSIS — Z03818 Encounter for observation for suspected exposure to other biological agents ruled out: Secondary | ICD-10-CM | POA: Diagnosis not present

## 2022-03-30 DIAGNOSIS — M6281 Muscle weakness (generalized): Secondary | ICD-10-CM

## 2022-03-30 DIAGNOSIS — R410 Disorientation, unspecified: Secondary | ICD-10-CM | POA: Diagnosis not present

## 2022-03-30 DIAGNOSIS — R293 Abnormal posture: Secondary | ICD-10-CM

## 2022-03-30 NOTE — Telephone Encounter (Signed)
Pt's husband called wanting to know if pt can be seen sooner no matter if MD or NP due to pt getting worse and not being able to recognize family and not being coherent. Please advise.

## 2022-03-30 NOTE — Telephone Encounter (Signed)
Contacted pt spouse back, he stated on Christmas day she started acting strange and not her normal self. Last night she could walk well, threatening to call the police, couldn't recognized him or daughter and behaving inappropriately. He wanted her to be seen today, informed him unfortunately, we do not have availability today or tomorrow. Pt does have upcoming appt with Dr Rexene Alberts next week 04/05/22. Advised to contact PCP or Urgent care to get evaluation to make sure there is not underlying concerns or UTIs causing this behavior. He denied the need to go to urgent care but will contact PCP.

## 2022-03-30 NOTE — Therapy (Signed)
OUTPATIENT PHYSICAL THERAPY NEURO TREATMENT- ARRIVED NO CHARGE    Patient Name: Deanna Rowe MRN: [id number] DOB:1955-12-17, 66 y.o., female Today's Date: 03/30/2022   PCP: Aretta Nip, MD REFERRING PROVIDER: Ward Givens, NP    PT End of Session - 03/30/22 1323     Visit Number [id number]   Arrived no charge   Number of Visits 23    Date for PT Re-Evaluation 05/14/22   due to delay in scheduling/pt going on vacation   Authorization Type [facility]    PT Start Time 1315    PT Stop Time 1330   Pt hallucinating and lethargic   PT Time Calculation (min) 15 min    Equipment Utilized During Treatment --    Activity Tolerance Other (comment);Patient limited by lethargy   Pt unable to stay awake and hallucinating   Behavior During Therapy Flat affect;Anxious                        Past Medical History:  Diagnosis Date   Anemia    Anxiety    Arthritis    L HIP   Asthma    Avascular necrosis of hip ([place])    LEFT   Back pain    Chest pain    Constipation    Depression    Diabetes mellitus    Diabetes mellitus, type II (HCC)    Dry cough    Edema, lower extremity    Gait abnormality 11/18/2019   GERD (gastroesophageal reflux disease)    High cholesterol    Hypertension    Insomnia    takes Ambien nightly   Joint pain    Kidney disease    Kidney disease    Obesity    Parkinsonism 03/02/2017   PONV (postoperative nausea and vomiting)    RLS (restless legs syndrome) 01/15/2018   Sleep apnea    Swallowing difficulty    Thyroid disease    TIA (transient ischemic attack) 2012   no residual problems   Urgency incontinence    Vitamin D deficiency    Past Surgical History:  Procedure Laterality Date   DILATION AND CURETTAGE OF UTERUS     ESOPHAGEAL MANOMETRY N/A 09/03/2012   Procedure: ESOPHAGEAL MANOMETRY (EM);  Surgeon: Garlan Fair, MD;  Location: [facility];  Service: Endoscopy;  Laterality: N/A;   ESOPHAGEAL MANOMETRY N/A 06/19/2017    Procedure: ESOPHAGEAL MANOMETRY (EM);  Surgeon: Clarene Essex, MD;  Location: [facility];  Service: Endoscopy;  Laterality: N/A;   EXPLORATORY LAPAROTOMY     GANGLION CYST EXCISION     JOINT REPLACEMENT  2011   rt total hip   KNEE ARTHROSCOPY     PILONIDAL CYST / SINUS EXCISION     TOTAL HIP ARTHROPLASTY Left 10/09/2013   Procedure: LEFT TOTAL HIP ARTHROPLASTY ANTERIOR APPROACH;  Surgeon: [name], MD;  Location: [facility];  Service: Orthopedics;  Laterality: Left;   Patient Active Problem List   Diagnosis Date Noted   Allergic conjunctivitis of both eyes 07/06/2020   Gait abnormality 11/18/2019   OSA on CPAP 06/04/2019   Pain in joint of left shoulder 03/28/2019   Seasonal allergic conjunctivitis 02/07/2019   Chronic intermittent hypoxia with obstructive sleep apnea 01/25/2019   Severe obstructive sleep apnea 01/25/2019   Excessive daytime sleepiness 01/03/2019   Non-restorative sleep 01/03/2019   Nocturia more than twice per night 01/03/2019   Weight gain, abnormal 01/03/2019   Snorings 01/03/2019   Trochanteric  bursitis of right hip 04/27/2018   Encounter for routine screening for malformation using ultrasonics 02/08/2018   Abnormal weight gain 02/08/2018   Acid indigestion 02/08/2018   Acute antritis 02/08/2018   Class 2 severe obesity with serious comorbidity and body mass index (BMI) of 39.0 to 39.9 in adult ([place]) 02/08/2018   Perennial allergic rhinitis 02/08/2018   Antiphospholipid syndrome ([place]) 02/08/2018   Arthralgia of hip or thigh 02/08/2018   Asthma, cough variant 02/08/2018   Vitamin D deficiency 02/08/2018   Backhand tennis elbow 02/08/2018   Cannot sleep 02/08/2018   Chronic kidney disease (CKD), stage III (moderate) ([place]) 02/08/2018   Current drug use 02/08/2018   Depression, major, single episode, in partial remission ([place]) 02/08/2018   Diabetes mellitus with polyneuropathy ([place]) 02/08/2018   Difficulty hearing 02/08/2018   Disturbance of skin  sensation 02/08/2018   Hypertension associated with diabetes ([place]) 02/08/2018   Extremity pain 02/08/2018   Facet syndrome, lumbar 02/08/2018   Factor V Leiden ([place]) 02/08/2018   Fungal infection of nail 02/08/2018   Gastroenteritis and colitis, viral 02/08/2018   Hypoglycemia 02/08/2018   Menopause 02/08/2018   Other long term (current) drug therapy 02/08/2018   Other osteonecrosis, unspecified femur ([place]) 02/08/2018   Pure hypercholesterolemia 02/08/2018   Recurrent major depressive episodes ([place]) 02/08/2018   Syncope and collapse 02/08/2018   Not well controlled moderate persistent asthma 02/08/2018   Cough, persistent 02/08/2018   Restless leg syndrome 01/15/2018   Degeneration of lumbar intervertebral disc 11/21/2017   Tremor of right hand 03/02/2017   Parkinson disease 03/02/2017   UTI (urinary tract infection) 12/01/2015   Type 2 diabetes mellitus with stage 3b chronic kidney disease, with long-term current use of insulin ([place]) 06/03/2015   Fast heart beat 04/09/2015   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 2 diabetes mellitus ([place]) 04/24/2014   Postoperative anemia due to acute blood loss 10/10/2013   Avascular necrosis of bone of left hip ([place]) 10/09/2013   Arthritis of pelvic region, degenerative 10/09/2013   Breath shortness 06/18/2013   Diabetes mellitus type 2, uncontrolled 05/31/2013   Intermittent claudication ([place]) 04/04/2013   Anxiety    Gastroesophageal reflux disease 09/12/2012   Clinical depression 02/21/2012   Stroke ([place]) 02/11/2012   History of revision of total replacement of right hip joint 04/04/2009    ONSET DATE: 12/02/2021   REFERRING DIAG: G20 (ICD-10-CM) - Parkinson disease ([place]) R29.6 (ICD-10-CM) - Falls frequently   THERAPY DIAG:  Muscle weakness (generalized)  Other abnormalities of gait and mobility  Abnormal posture  Rationale for Evaluation and Treatment Rehabilitation  SUBJECTIVE:  SUBJECTIVE STATEMENT: Pt's husband reports pt has been hallucinating since yesterday, fell last night and was unable to get up. Husband had to call daughter to get pt up.   Pt accompanied by: significant other  PERTINENT HISTORY: Parkinsonism (2018),TIA, thyroid disease, kidney disease, HTN, HLD, GERD, DMII, depression, L hip avascular necrosis, anxiety, anemia, R THA 2011, L THA 2015, knee arthroscopy   PAIN:  Are you having pain? Yes: NPRS scale: 10/10 Pain location: R heel Pain description: Sharp Aggravating factors: Standing Relieving factors: Sitting, Icing, pain medication   PRECAUTIONS: Fall, No driving.   FALLS: Has patient fallen in last 6 months? Yes. Number of falls 10  VITALS Vitals:   03/30/22 1340  BP: (!) 144/76  Pulse: 86     PLOF: Independent with community mobility with device  PATIENT GOALS Wants to stop falling, improve balance.    OBJECTIVE:   COGNITION: Overall cognitive status: Impaired  TODAY'S TREATMENT:   Pt ambulated in clinic very disoriented - did not recognize therapist and husband reports she did not recognize him last night. Husband states pt slid out of chair last night and could not get up, pt reports she was "being held hostage. They took me from my house". Husband reports pt was at home the entire time. Pt unable to keep eyes open during session, vitals assessed (see above) and systolic BP elevated. Encouraged husband to take pt to PCP or urgent care to assess for potential UTI, as pt has been delirious for >24 hours.     PATIENT EDUCATION: Education details: see above Person educated: Patient and Spouse Education method: Explanation, Demonstration, and Handouts Education comprehension: verbalized understanding and returned demonstration   HOME EXERCISE PROGRAM: Access Code: [email or web address] URL:  [email or web address] Date: 12/22/2021 Prepared by: Mickie Bail Ryan Palermo  Exercises - Sit to Stand Without Arm Support  - 1 x daily - 7 x weekly - 2 sets - 5-6 reps - Heel Raises with Counter Support  - 1 x daily - 7 x weekly - 3 sets - 10 reps - Step sideways with arms reaching at counter   - 1 x daily - 7 x weekly - 3 sets - 10 reps (same as PWR step) - Alternating Step Backward with Support  - 1 x daily - 7 x weekly - 3 sets - 10 reps - Standing Quarter Turn with Counter Support  - 1 x daily - 7 x weekly - 3 sets - 10 reps - Seated Hamstring Stretch  - 1 x daily - 7 x weekly - 1 sets - 3-4 reps - 30 second hold - Calf stretch  - 1 x daily - 7 x weekly - 1 sets - 3-4 reps - 30-45 second hold   Standing PWR moves (with supervision from husband and having chair in front of pt)   GOALS: Goals reviewed with patient? Yes   UPDATED/ONGOING LTGS FOR RE-CERT LONG TERM GOALS: Target date: 04/01/22 (UPDATED DATE)  Pt will be independent with final HEP for PD specific deficits/balance in order to build upon functional gains made in therapy. Baseline: pt performing at home, will benefit from updates/additions  Goal status: ON-GOING  2.  Pt perform L lateral stepping strategy in 1-2 steps in order to demo improved balance recovery.  Baseline: 2-3 steps on 03/15/22 Goal status: NEW  3.  Pt will improve gait speed with rollator to at least 2.4 ft/sec in order to demo improved community mobility.  Baseline: 15.8 seconds with rollator = 2.06 ft/sec, pt only using  cane now when going up and down the stairs  Goal status: NEW  4.  Pt will improve 5x sit<>stand to less than or equal to 16 sec with no UE support from lower surfaces to demonstrate improved functional strength and transfer efficiency.  Baseline: 18.5 seconds with no UE support from lower chair  Goal status: NEW  5.  Pt will perform at least 12 steps with use of cane with proper sequencing with mod I in order to demo  improved safety with stairs at home.  Baseline: supervision/CGA Goal status: NEW  ASSESSMENT:  CLINICAL IMPRESSION: Arrived no charge due to pt being in delusional state and needing to be seen by PCP.   OBJECTIVE IMPAIRMENTS Abnormal gait, decreased activity tolerance, decreased balance, decreased coordination, difficulty walking, decreased strength, impaired flexibility, impaired tone, and postural dysfunction.   ACTIVITY LIMITATIONS bending, squatting, stairs, transfers, and locomotion level  PARTICIPATION LIMITATIONS: driving and community activity  PERSONAL FACTORS Behavior pattern, Past/current experiences, Time since onset of injury/illness/exacerbation, and 3+ comorbidities: Parkinsonism (2018),TIA, thyroid disease, kidney disease, HTN, HLD, GERD, DMII, depression, L hip avascular necrosis, anxiety, anemia, R THA 2011, L THA 2015, knee arthroscopy   are also affecting patient's functional outcome.   REHAB POTENTIAL: Good  CLINICAL DECISION MAKING: Evolving/moderate complexity  EVALUATION COMPLEXITY: Moderate  PLAN: PT FREQUENCY: 2x/week  PT DURATION: 8 weeks  PLANNED INTERVENTIONS: Therapeutic exercises, Therapeutic activity, Neuromuscular re-education, Balance training, Gait training, Self Care, Stair training, Vestibular training, DME instructions, Manual therapy, and Re-evaluation  PLAN FOR NEXT SESSION:  How is pt? Start assessing goals/preparing for DC. Continue with stepping strategies (posterior and L lateral). Sit to stands from lower surfaces (like getting up from recliner)functional strength. Work on IT trainer. Scifit for endurance,forward lunge over beam, reaching out of BOS, resisted walking.  Cruzita Lederer Tineka Uriegas, PT, DPT  03/30/2022, 1:41 PM

## 2022-04-01 ENCOUNTER — Ambulatory Visit: Payer: 59 | Admitting: Podiatry

## 2022-04-01 ENCOUNTER — Encounter: Payer: Self-pay | Admitting: Physical Therapy

## 2022-04-01 ENCOUNTER — Ambulatory Visit: Payer: 59 | Admitting: Physical Therapy

## 2022-04-01 VITALS — BP 111/63 | HR 67

## 2022-04-01 DIAGNOSIS — M6281 Muscle weakness (generalized): Secondary | ICD-10-CM

## 2022-04-01 DIAGNOSIS — R293 Abnormal posture: Secondary | ICD-10-CM | POA: Diagnosis not present

## 2022-04-01 DIAGNOSIS — R41844 Frontal lobe and executive function deficit: Secondary | ICD-10-CM | POA: Diagnosis not present

## 2022-04-01 DIAGNOSIS — R2689 Other abnormalities of gait and mobility: Secondary | ICD-10-CM | POA: Diagnosis not present

## 2022-04-01 DIAGNOSIS — R2681 Unsteadiness on feet: Secondary | ICD-10-CM | POA: Diagnosis not present

## 2022-04-01 DIAGNOSIS — R41840 Attention and concentration deficit: Secondary | ICD-10-CM | POA: Diagnosis not present

## 2022-04-01 DIAGNOSIS — R278 Other lack of coordination: Secondary | ICD-10-CM | POA: Diagnosis not present

## 2022-04-01 DIAGNOSIS — R29818 Other symptoms and signs involving the nervous system: Secondary | ICD-10-CM | POA: Diagnosis not present

## 2022-04-01 NOTE — Therapy (Signed)
OUTPATIENT PHYSICAL THERAPY NEURO TREATMENT   Patient Name: Deanna Rowe MRN: [id number] DOB:04-02-1956, 66 y.o., female Today's Date: 04/01/2022   PCP: Aretta Nip, MD REFERRING PROVIDER: Ward Givens, NP    PT End of Session - 04/01/22 1105     Visit Number 18    Number of Visits 23    Date for PT Re-Evaluation 05/14/22   due to delay in scheduling/pt going on vacation   Authorization Type [facility]    PT Start Time 1103    PT Stop Time 1143    PT Time Calculation (min) 40 min    Equipment Utilized During Treatment Gait belt    Activity Tolerance Patient tolerated treatment well    Behavior During Therapy [facility] for tasks assessed/performed;Flat affect                        Past Medical History:  Diagnosis Date   Anemia    Anxiety    Arthritis    L HIP   Asthma    Avascular necrosis of hip ([place])    LEFT   Back pain    Chest pain    Constipation    Depression    Diabetes mellitus    Diabetes mellitus, type II (HCC)    Dry cough    Edema, lower extremity    Gait abnormality 11/18/2019   GERD (gastroesophageal reflux disease)    High cholesterol    Hypertension    Insomnia    takes Ambien nightly   Joint pain    Kidney disease    Kidney disease    Obesity    Parkinsonism 03/02/2017   PONV (postoperative nausea and vomiting)    RLS (restless legs syndrome) 01/15/2018   Sleep apnea    Swallowing difficulty    Thyroid disease    TIA (transient ischemic attack) 2012   no residual problems   Urgency incontinence    Vitamin D deficiency    Past Surgical History:  Procedure Laterality Date   DILATION AND CURETTAGE OF UTERUS     ESOPHAGEAL MANOMETRY N/A 09/03/2012   Procedure: ESOPHAGEAL MANOMETRY (EM);  Surgeon: Garlan Fair, MD;  Location: [facility];  Service: Endoscopy;  Laterality: N/A;   ESOPHAGEAL MANOMETRY N/A 06/19/2017   Procedure: ESOPHAGEAL MANOMETRY (EM);  Surgeon: Clarene Essex, MD;  Location: [facility];   Service: Endoscopy;  Laterality: N/A;   EXPLORATORY LAPAROTOMY     GANGLION CYST EXCISION     JOINT REPLACEMENT  2011   rt total hip   KNEE ARTHROSCOPY     PILONIDAL CYST / SINUS EXCISION     TOTAL HIP ARTHROPLASTY Left 10/09/2013   Procedure: LEFT TOTAL HIP ARTHROPLASTY ANTERIOR APPROACH;  Surgeon: [name], MD;  Location: [facility];  Service: Orthopedics;  Laterality: Left;   Patient Active Problem List   Diagnosis Date Noted   Allergic conjunctivitis of both eyes 07/06/2020   Gait abnormality 11/18/2019   OSA on CPAP 06/04/2019   Pain in joint of left shoulder 03/28/2019   Seasonal allergic conjunctivitis 02/07/2019   Chronic intermittent hypoxia with obstructive sleep apnea 01/25/2019   Severe obstructive sleep apnea 01/25/2019   Excessive daytime sleepiness 01/03/2019   Non-restorative sleep 01/03/2019   Nocturia more than twice per night 01/03/2019   Weight gain, abnormal 01/03/2019   Snorings 01/03/2019   Trochanteric bursitis of right hip 04/27/2018   Encounter for routine screening for malformation using ultrasonics 02/08/2018  Abnormal weight gain 02/08/2018   Acid indigestion 02/08/2018   Acute antritis 02/08/2018   Class 2 severe obesity with serious comorbidity and body mass index (BMI) of 39.0 to 39.9 in adult ([place]) 02/08/2018   Perennial allergic rhinitis 02/08/2018   Antiphospholipid syndrome ([place]) 02/08/2018   Arthralgia of hip or thigh 02/08/2018   Asthma, cough variant 02/08/2018   Vitamin D deficiency 02/08/2018   Backhand tennis elbow 02/08/2018   Cannot sleep 02/08/2018   Chronic kidney disease (CKD), stage III (moderate) ([place]) 02/08/2018   Current drug use 02/08/2018   Depression, major, single episode, in partial remission ([place]) 02/08/2018   Diabetes mellitus with polyneuropathy ([place]) 02/08/2018   Difficulty hearing 02/08/2018   Disturbance of skin sensation 02/08/2018   Hypertension associated with diabetes ([place]) 02/08/2018   Extremity pain  02/08/2018   Facet syndrome, lumbar 02/08/2018   Factor V Leiden ([place]) 02/08/2018   Fungal infection of nail 02/08/2018   Gastroenteritis and colitis, viral 02/08/2018   Hypoglycemia 02/08/2018   Menopause 02/08/2018   Other long term (current) drug therapy 02/08/2018   Other osteonecrosis, unspecified femur ([place]) 02/08/2018   Pure hypercholesterolemia 02/08/2018   Recurrent major depressive episodes ([place]) 02/08/2018   Syncope and collapse 02/08/2018   Not well controlled moderate persistent asthma 02/08/2018   Cough, persistent 02/08/2018   Restless leg syndrome 01/15/2018   Degeneration of lumbar intervertebral disc 11/21/2017   Tremor of right hand 03/02/2017   Parkinson disease 03/02/2017   UTI (urinary tract infection) 12/01/2015   Type 2 diabetes mellitus with stage 3b chronic kidney disease, with long-term current use of insulin ([place]) 06/03/2015   Fast heart beat 04/09/2015   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 2 diabetes mellitus ([place]) 04/24/2014   Postoperative anemia due to acute blood loss 10/10/2013   Avascular necrosis of bone of left hip ([place]) 10/09/2013   Arthritis of pelvic region, degenerative 10/09/2013   Breath shortness 06/18/2013   Diabetes mellitus type 2, uncontrolled 05/31/2013   Intermittent claudication ([place]) 04/04/2013   Anxiety    Gastroesophageal reflux disease 09/12/2012   Clinical depression 02/21/2012   Stroke ([place]) 02/11/2012   History of revision of total replacement of right hip joint 04/04/2009    ONSET DATE: 12/02/2021   REFERRING DIAG: G20 (ICD-10-CM) - Parkinson disease ([place]) R29.6 (ICD-10-CM) - Falls frequently   THERAPY DIAG:  Muscle weakness (generalized)  Other abnormalities of gait and mobility  Abnormal posture  Other lack of coordination  Rationale for Evaluation and Treatment Rehabilitation  SUBJECTIVE:  SUBJECTIVE STATEMENT: Feeling better today. Went to [facility] the other day and had a full evaluation and had a urinalysis and it was negative. Will see the neurologist next Tuesday. Had 1 fall last Friday - was trying to get into bed and fell onto the floor. Was able to get up on her own. Did not hurt herself from that fall. Reports not having any R heel pain anymore. Cancelled her appt with the doctor for her foot today. Is going to keep her podiatrist appt for next week instead. Pt able to tell where she is today.   Pt accompanied by: significant other  PERTINENT HISTORY: Parkinsonism (2018),TIA, thyroid disease, kidney disease, HTN, HLD, GERD, DMII, depression, L hip avascular necrosis, anxiety, anemia, R THA 2011, L THA 2015, knee arthroscopy   PAIN:  Are you having pain? No   PRECAUTIONS: Fall, No driving.   FALLS: Has patient fallen in last 6 months? Yes. Number of falls 10  VITALS Vitals:   04/01/22 1112  BP: 111/63  Pulse: 67      PLOF: Independent with community mobility with device  PATIENT GOALS Wants to stop falling, improve balance.    OBJECTIVE:   COGNITION: Overall cognitive status: Impaired  TODAY'S TREATMENT  GAIT: Gait pattern:  genu valgus of RLE, step through pattern, decreased stride length, Right foot flat, Left foot flat, decreased trunk rotation, trunk flexed, and narrow BOS Distance walked: Clinic distances. Assistive device utilized: Environmental consultant - 4 wheeled Level of assistance: SBA Comments: Cues to stay inside the rollator and for tall posture/looking ahead.   Therapeutic Activity:  Discussed recent urgent care visit with pt and pt's spouse and that urinalysis came back negative and that they could not find a cause for pt's delirium/hallucinations (PT unable to read notes as they went to [facility]) and pt feeling much better today and able to participate in therapy. Pt has appt with  neurologist on the 2nd. Today's was last scheduled visit, but decided to schedule one more next week in conjunction with OT after pt sees neurologist and plan to D/C at that time. Discussed bringing pt's newly organized folder to review exercises with pt and pt's spouse. Both in agreement with plan to D/C and pt having return PD evals in April.  NMR:  Pt performs PWR! Moves in Standing position with chair in front of pt as needed for balance:    PWR! Up for improved posture x10 reps, pt demonstrated good technique with mini squat position    PWR! Rock for improved weight shifting x20 reps, cues to look up at hands, pt needing to tap chair intermittently for balance.    PWR! Twist for improved trunk rotation x5 reps each side, cues to look at hands when twisting and looking at hands    PWR! Step for improved step initiation x10 reps each side with cues for incr foot clearance.   10 reps sit <> stands from air ex, with cues for incr forward lean and tucking feet back   In // bars on blue mat: Staggered stance weight shifting x15 reps each side, pt needing intermittent UE support for balance, esp with RLE posteriorly. Pt needing verbal/demo cues for technique.  In staggered stance ball toss with 2nd PT, performed x15 tosses each side posteriorly, pt more challenged with RLE posteriorly  Alternating posterior stepping x15 reps each, intermittent UE support    PATIENT EDUCATION: Education details: See therapeutic activity section.  Person educated: Patient and Spouse Education method: Explanation  Education comprehension: verbalized understanding and returned demonstration   HOME EXERCISE PROGRAM: Access Code: [email or web address] URL: [email or web address] Date: 12/22/2021 Prepared by: Mickie Bail Plaster  Exercises - Sit to Stand Without Arm Support  - 1 x daily - 7 x weekly - 2 sets - 5-6 reps - Heel Raises with Counter Support  - 1 x daily - 7 x weekly - 3 sets - 10 reps - Step  sideways with arms reaching at counter   - 1 x daily - 7 x weekly - 3 sets - 10 reps (same as PWR step) - Alternating Step Backward with Support  - 1 x daily - 7 x weekly - 3 sets - 10 reps - Standing Quarter Turn with Counter Support  - 1 x daily - 7 x weekly - 3 sets - 10 reps - Seated Hamstring Stretch  - 1 x daily - 7 x weekly - 1 sets - 3-4 reps - 30 second hold - Calf stretch  - 1 x daily - 7 x weekly - 1 sets - 3-4 reps - 30-45 second hold   Standing PWR moves (with supervision from husband and having chair in front of pt)   GOALS: Goals reviewed with patient? Yes   UPDATED/ONGOING LTGS FOR RE-CERT LONG TERM GOALS: Target date: 04/01/22 (UPDATED DATE)  Pt will be independent with final HEP for PD specific deficits/balance in order to build upon functional gains made in therapy. Baseline: pt performing at home, will benefit from updates/additions  Goal status: ON-GOING  2.  Pt perform L lateral stepping strategy in 1-2 steps in order to demo improved balance recovery.  Baseline: 2-3 steps on 03/15/22 Goal status: NEW  3.  Pt will improve gait speed with rollator to at least 2.4 ft/sec in order to demo improved community mobility.  Baseline: 15.8 seconds with rollator = 2.06 ft/sec, pt only using cane now when going up and down the stairs  Goal status: NEW  4.  Pt will improve 5x sit<>stand to less than or equal to 16 sec with no UE support from lower surfaces to demonstrate improved functional strength and transfer efficiency.  Baseline: 18.5 seconds with no UE support from lower chair  Goal status: NEW  5.  Pt will perform at least 12 steps with use of cane with proper sequencing with mod I in order to demo improved safety with stairs at home.  Baseline: supervision/CGA Goal status: NEW  ASSESSMENT:  CLINICAL IMPRESSION: Pt doing much better today and able to participate in PT. Pt went to urgent care the other day and no cause was determined for pt's  hallucinations/delirium. Pt to see neurologist next week. Today's skilled session focused on reviewing standing PWR moves and standing balance strategies on unlevel surfaces with focus on posterior stepping and weight shifting. Pt overall tolerated session well. Added one more appt next week after pt sees the neurologist and plan to D/C at that time.   OBJECTIVE IMPAIRMENTS Abnormal gait, decreased activity tolerance, decreased balance, decreased coordination, difficulty walking, decreased strength, impaired flexibility, impaired tone, and postural dysfunction.   ACTIVITY LIMITATIONS bending, squatting, stairs, transfers, and locomotion level  PARTICIPATION LIMITATIONS: driving and community activity  PERSONAL FACTORS Behavior pattern, Past/current experiences, Time since onset of injury/illness/exacerbation, and 3+ comorbidities: Parkinsonism (2018),TIA, thyroid disease, kidney disease, HTN, HLD, GERD, DMII, depression, L hip avascular necrosis, anxiety, anemia, R THA 2011, L THA 2015, knee arthroscopy   are also affecting patient's functional outcome.   REHAB POTENTIAL: Good  CLINICAL DECISION MAKING:  Evolving/moderate complexity  EVALUATION COMPLEXITY: Moderate  PLAN: PT FREQUENCY: 2x/week  PT DURATION: 8 weeks  PLANNED INTERVENTIONS: Therapeutic exercises, Therapeutic activity, Neuromuscular re-education, Balance training, Gait training, Self Care, Stair training, Vestibular training, DME instructions, Manual therapy, and Re-evaluation  PLAN FOR NEXT SESSION:  Check LTGs as able, finalize HEP and D/C from therapy.   Arliss Journey, PT, DPT  04/01/2022, 12:04 PM

## 2022-04-05 ENCOUNTER — Encounter: Payer: Self-pay | Admitting: Neurology

## 2022-04-05 ENCOUNTER — Other Ambulatory Visit (HOSPITAL_BASED_OUTPATIENT_CLINIC_OR_DEPARTMENT_OTHER): Payer: Self-pay

## 2022-04-05 ENCOUNTER — Ambulatory Visit (INDEPENDENT_AMBULATORY_CARE_PROVIDER_SITE_OTHER): Payer: Commercial Managed Care - PPO | Admitting: Neurology

## 2022-04-05 VITALS — BP 131/76 | HR 69 | Ht 66.0 in | Wt 211.0 lb

## 2022-04-05 DIAGNOSIS — R443 Hallucinations, unspecified: Secondary | ICD-10-CM

## 2022-04-05 DIAGNOSIS — R410 Disorientation, unspecified: Secondary | ICD-10-CM

## 2022-04-05 DIAGNOSIS — G20C Parkinsonism, unspecified: Secondary | ICD-10-CM | POA: Diagnosis not present

## 2022-04-05 DIAGNOSIS — T50905S Adverse effect of unspecified drugs, medicaments and biological substances, sequela: Secondary | ICD-10-CM | POA: Diagnosis not present

## 2022-04-05 NOTE — Progress Notes (Signed)
Subjective:    Patient ID: Deanna Rowe is a 67 y.o. female.  HPI    Interim history:   Deanna Rowe is a 67 year old right-handed woman with an underlying medical history of asthma, arthritis, sleep apnea, reflux disease, hypertension, hyperlipidemia, avascular necrosis of the hip, back pain, history of chest pain, depression, diabetes, restless leg syndrome, vitamin D deficiency, parkinsonism, and obesity, who presents for follow-up consultation of her parkinsonism. The patient is accompanied by her husband today. I last saw her on 08/05/2021, at which time she reported feeling stable.  She was on Sinemet CR 1 pill 5 times a day and ropinirole 2 mg strength twice daily as well as Sinemet IR 1 pill twice daily. She was advised to continue with her medication regimen.  She saw Ward Givens, NP on 12/02/21, at which time the patient reported 5 falls within the past 2 or 3 weeks.  She was not using her CPAP machine as she could not tolerate it.  She was advised to use her cane at all times.  She was referred to physical therapy.  Today, 04/05/2022: She reports feeling better, no more hallucinations since after Christmas day.  Her husband reports that she had fairly sudden onset of hallucinations, confusion, lethargy on Christmas Day and the day after Christmas.  They went to primary care, she could not do PT, I even got a note from OT from neurorehab reporting that she was unable to recognize people and lethargic.  The patient went to urgent care and had a urinalysis, no blood work was done.  Urinalysis and urine culture was negative for a UTI.  She hydrates well with water.  She is less lethargic, no other medication changes from her psychiatrist but did start taking a narcotic recently.  She took hydrocodone right before Christmas for foot pain.  Her husband reports that she had not taken any of her existing medications inadvertently twice, he tried to double check and did not find any obvious evidence  of medication error.  Nevertheless, he has taken over her medication prep for the week.  He has her double check with him when he prepares the medication dispenser for the week.   The patient's allergies, current medications, family history, past medical history, past social history, past surgical history and problem list were reviewed and updated as appropriate.    Previously:   I first met her on 01/29/2021, at which time she was advised to continue with Sinemet CR 1 pill 5 times a day and Sinemet IR 1 pill once daily or up to twice daily. She was previously followed by Dr. Margette Fast.  She saw Ward Givens, NP in the interim on 06/07/2021 for sleep apnea follow-up.  She reported that she was not able to use her PAP machine consistently due to chronic cough.  She was advised to restart CPAP therapy.  She was advised to continue with Sinemet CR 1 pill 5 times a day and Sinemet IR twice daily.  She was advised to use her cane at all times.     01/29/21: 67 year old right-handed woman with an underlying complex medical history of asthma, arthritis, anemia, avascular necrosis of the hip, depression, anxiety, type 2 diabetes, reflux disease, hyperlipidemia, hypertension, kidney disease, restless leg syndrome, history of TIA, thyroid disease, vitamin D deficiency, and obesity, who was diagnosed with parkinsonism several years ago.  She was noted to have right-sided signs.  She was on Abilify in the past.  She is no longer on  Abilify for the past 2 years, but she is on multiple psychotropic medications including lorazepam, bupropion, Rexulti, Trintellix, and also takes gabapentin as well as zolpidem.  She has clearly stopped the zolpidem about 3 weeks ago and is currently on melatonin 5 mg each night which has been helpful.  She is on ropinirole for restless leg syndrome per primary care, currently on 2 mg twice daily.  For parkinsonism she is on Sinemet CR, 50-200 mg strength 1 pill 5 times a day.  She last  saw Dr. Jannifer Franklin on 08/26/2020, at which time she was advised to add Sinemet IR 25-250 mg strength, 1 pill twice daily, in the morning and at 2 PM daily.     She has a history of urinary incontinence and is currently on Myrbetriq and Vesicare per urology.  She has chronic constipation for at least 1 year and has been on MiraLAX daily, and 2 stool softeners daily. She has been using a cane.  Since the last appointment, she has not fallen thankfully.  She believes that the addition of Sinemet IR has helped but she only takes 1 in the morning.   She had a brain MRI without contrast on 03/15/2017 and I reviewed the results:   IMPRESSION: Slightly abnormal MRI scan of the brain showing prominent changes of chronic microvascular ischemia.  No acute abnormalities noted.  Overall no significant change compared with prior MRI scan dated 05/23/2014.   For her sleep apnea, she is followed by Dr. Brett Fairy and the nurse practitioner.  She reports 50% compliance with her CPAP.  Sometimes acid reflux bothers her at night which bothers her CPAP usage.   She is followed by Dr. Reece Levy in psychiatry.   08/26/20 (Dr. Jannifer Franklin): << Deanna Rowe is a 67 year old right-handed black female with a history of Parkinson's disease, depression, diabetes, and sleep apnea.  The patient is currently in physical therapy twice weekly.  She has not had any falls in about 2 weeks.  She claims that she has gained improvement with taking the Sinemet CR more frequently, but she still has problems with wearing off around 2 PM.  The patient may freeze up and fall at times.  The patient may use a cane or walker for ambulation.  She is tolerating medications fairly well, she denies any excessive daytime drowsiness currently.  She returns to the office today for further evaluation.>>      Her Past Medical History Is Significant For: Past Medical History:  Diagnosis Date   Anemia    Anxiety    Arthritis    L HIP   Asthma    Avascular necrosis of  hip ([place])    LEFT   Back pain    Chest pain    Constipation    Depression    Diabetes mellitus    Diabetes mellitus, type II (HCC)    Dry cough    Edema, lower extremity    Gait abnormality 11/18/2019   GERD (gastroesophageal reflux disease)    High cholesterol    Hypertension    Insomnia    takes Ambien nightly   Joint pain    Kidney disease    Kidney disease    Obesity    Parkinsonism 03/02/2017   PONV (postoperative nausea and vomiting)    RLS (restless legs syndrome) 01/15/2018   Sleep apnea    Swallowing difficulty    Thyroid disease    TIA (transient ischemic attack) 2012   no residual problems   Urgency incontinence  Vitamin D deficiency     Her Past Surgical History Is Significant For: Past Surgical History:  Procedure Laterality Date   DILATION AND CURETTAGE OF UTERUS     ESOPHAGEAL MANOMETRY N/A 09/03/2012   Procedure: ESOPHAGEAL MANOMETRY (EM);  Surgeon: Garlan Fair, MD;  Location: [facility];  Service: Endoscopy;  Laterality: N/A;   ESOPHAGEAL MANOMETRY N/A 06/19/2017   Procedure: ESOPHAGEAL MANOMETRY (EM);  Surgeon: Clarene Essex, MD;  Location: [facility];  Service: Endoscopy;  Laterality: N/A;   EXPLORATORY LAPAROTOMY     GANGLION CYST EXCISION     JOINT REPLACEMENT  2011   rt total hip   KNEE ARTHROSCOPY     PILONIDAL CYST / SINUS EXCISION     TOTAL HIP ARTHROPLASTY Left 10/09/2013   Procedure: LEFT TOTAL HIP ARTHROPLASTY ANTERIOR APPROACH;  Surgeon: [name], MD;  Location: [facility];  Service: Orthopedics;  Laterality: Left;    Her Family History Is Significant For: Family History  Problem Relation Age of Onset   High blood pressure Mother    Hyperlipidemia Mother    Heart disease Mother    Heart disease Father    Alcoholism Father    Allergic rhinitis Sister    Alcohol abuse Brother    Alcohol abuse Brother    Alcohol abuse Brother    Alcohol abuse Brother    Alzheimer's disease Maternal Aunt    Diabetes Neg Hx    Angioedema  Neg Hx    Asthma Neg Hx    Eczema Neg Hx    Immunodeficiency Neg Hx    Urticaria Neg Hx    Breast cancer Neg Hx    Parkinson's disease Neg Hx    Sleep apnea Neg Hx     Her Social History Is Significant For: Social History   Socioeconomic History   Marital status: Married    Spouse name: Simona Huh   Number of children: Not on file   Years of education: Not on file   Highest education level: Not on file  Occupational History   Occupation: retired Therapist, sports  Tobacco Use   Smoking status: Never   Smokeless tobacco: Never  Vaping Use   Vaping Use: Never used  Substance and Sexual Activity   Alcohol use: No    Alcohol/week: 0.0 standard drinks of alcohol   Drug use: No   Sexual activity: Not Currently  Other Topics Concern   Not on file  Social History Narrative   Lives with husband   Caffeine use: none   Right handed    Social Determinants of Health   Financial Resource Strain: Not on file  Food Insecurity: Not on file  Transportation Needs: Not on file  Physical Activity: Not on file  Stress: Not on file  Social Connections: Not on file    Her Allergies Are:  Allergies  Allergen Reactions   Fluticasone-Salmeterol Anaphylaxis   Codeine Nausea And Vomiting   Fetzima [Levomilnacipran] Nausea And Vomiting   Nsaids Other (See Comments)    Upset stomach    Trulicity [Dulaglutide] Nausea And Vomiting    Significant and undesirably rapid (40 lbs) weight loss    Xanax [Alprazolam] Other (See Comments)    disoriented   Amoxicillin Rash    Has patient had a PCN reaction causing immediate rash, facial/tongue/throat swelling, SOB or lightheadedness with hypotension: Yes Has patient had a PCN reaction causing severe rash involving mucus membranes or skin necrosis: No Has patient had a PCN reaction that required hospitalization: No Has patient  had a PCN reaction occurring within the last 10 years: No If all of the above answers are "NO", then may proceed with Cephalosporin use.     Pseudoephedrine Hcl Er Palpitations  :   Her Current Medications Are:  Outpatient Encounter Medications as of 04/05/2022  Medication Sig   ACCU-CHEK FASTCLIX LANCETS MISC USE TO CHECK BLOOD SUGAR 3 TIMES A DAY   ACCU-CHEK GUIDE test strip USE TO CHECK BLOOD SUGAR 3 TIMES PER DAY. (Patient taking differently: Use to check blood sugar 4 times per day.)   albuterol (VENTOLIN HFA) 108 (90 Base) MCG/ACT inhaler Inhale 2 puffs into the lungs as needed for wheezing or shortness of breath.   amLODipine (NORVASC) 5 MG tablet Take 1 tablet by mouth once a day   aspirin EC 81 MG tablet Take 81 mg by mouth every evening.   atorvastatin (LIPITOR) 10 MG tablet TAKE 1 TABLET BY MOUTH AT BEDTIME   azelastine (ASTELIN) 0.1 % nasal spray Place 2 sprays into both nostrils 2 (two) times daily as needed.   brexpiprazole (REXULTI) 2 MG TABS tablet Take 1 tablet (2 mg total) by mouth in the morning.   buPROPion (WELLBUTRIN XL) 150 MG 24 hr tablet Take 3 tablets (450 mg total) by mouth in the morning.   Calcium Carbonate-Vitamin D 600-400 MG-UNIT tablet Take 1 tablet by mouth daily.   carbidopa-levodopa (SINEMET CR) 50-200 MG tablet TAKE 1 TABLET BY MOUTH 5 (FIVE) TIMES DAILY.   carbidopa-levodopa (SINEMET) 25-250 MG tablet Take 1 tablet by mouth in the morning and 1 tablet at 2pm   Continuous Blood Gluc Receiver (DEXCOM G7 RECEIVER) DEVI Use as directed to check blood sugar   Continuous Blood Gluc Sensor (DEXCOM G7 SENSOR) MISC Use to check blood sugar and change sensor every 10 days   EPINEPHrine (EPIPEN 2-PAK) 0.3 mg/0.3 mL IJ SOAJ injection Inject 0.3 mg into the muscle as needed for anaphylaxis.   gabapentin (NEURONTIN) 300 MG capsule Take 1 capsule (300 mg total) by mouth 3 (three) times daily.   HYDROcodone-acetaminophen (NORCO/VICODIN) 5-325 MG tablet Take 1 tablet by mouth every 6 (six) hours as needed for 10 days.   insulin degludec (TRESIBA FLEXTOUCH) 100 UNIT/ML FlexTouch Pen INJECT UNDER THE SKIN ONCE  DAILY. TRITRATE AS DIRECTED WITH A MAXIMUM DAILY DOSE OF 50 UNITS   insulin lispro (HUMALOG) 100 UNIT/ML injection SQ - 14 units at breakfast, 12 units at lunch, 16 units at supper   Insulin Pen Needle (TECHLITE PEN NEEDLES) 32G X 6 MM MISC use to Inject insulin into the skin 4 (four) times daily.   LORazepam (ATIVAN) 2 MG tablet Take 1 tablet (2 mg total) by mouth in the morning, at noon, and at bedtime.   losartan (COZAAR) 100 MG tablet Take 1 tablet (100 mg total) by mouth daily.   meloxicam (MOBIC) 15 MG tablet Take 1 tablet (15 mg total) by mouth daily.   mirabegron ER (MYRBETRIQ) 50 MG TB24 tablet Take 1 tablet by mouth everyday   Olopatadine HCl (PATADAY) 0.2 % SOLN Place 1 drop into both eyes daily as needed.   omalizumab Arvid Right) 150 MG/ML prefilled syringe Inject 300 mg into the skin every 28 days.   omeprazole (PRILOSEC) 20 MG capsule Take 1 capsule (20 mg total) by mouth daily 30 minutes prior to dinner   propranolol (INDERAL) 40 MG tablet TAKE 1 TABLET BY MOUTH TWICE DAILY   rOPINIRole (REQUIP) 2 MG tablet Take 1 tablet (2 mg total) by mouth in the  morning and at bedtime.   Semaglutide-Weight Management (WEGOVY) 2.4 MG/0.75ML SOAJ Inject 2.4 mg under the skin Once a week   solifenacin (VESICARE) 10 MG tablet TAKE 1 TABLET BY MOUTH ONCE DAILY   triamcinolone (NASACORT) 55 MCG/ACT AERO nasal inhaler Place 2 sprays into the nose daily.   valACYclovir (VALTREX) 500 MG tablet Take 1 tablet by mouth everyday   vitamin C (ASCORBIC ACID) 500 MG tablet Take 500 mg by mouth daily.   Vitamin D, Ergocalciferol, 2000 units CAPS Take 1 capsule by mouth daily. 2000 units daily.   vortioxetine HBr (TRINTELLIX) 20 MG TABS tablet Take 1 tablet (20 mg total) by mouth in the morning.   zolpidem (AMBIEN) 5 MG tablet Take 1 tablet (5 mg total) by mouth at bedtime.   brexpiprazole (REXULTI) 2 MG TABS tablet Take 1 tablet (2 mg total) by mouth in the morning.   COVID-19 mRNA vaccine 2023-2024 (COMIRNATY)  syringe Inject into the muscle.   cyclobenzaprine (FLEXERIL) 5 MG tablet Take 1 tablet (5 mg total) by mouth at bedtime.   glucose blood test strip Use to check blood sugar 4 times daily   influenza vaccine adjuvanted (FLUAD QUADRIVALENT) 0.5 ML injection Inject into the muscle.   influenza vaccine adjuvanted (FLUAD) 0.5 ML injection Inject into the muscle.   levocetirizine (XYZAL) 5 MG tablet Take 1 tablet (5 mg total) by mouth daily as needed.   melatonin 5 MG TABS Take 5 mg by mouth at bedtime.   Facility-Administered Encounter Medications as of 04/05/2022  Medication   tranexamic acid (CYKLOKAPRON) topical -INTRAOP  :  Review of Systems:  Out of a complete 14 point review of systems, all are reviewed and negative with the exception of these symptoms as listed below:  Review of Systems  Neurological:        Pt here for hallucinations . Husband states Hallucinations started Christmas day. Husband states pt  didn't know him or pt daughter. Husband states last week in PT pt saw a cat in room     Objective:  Neurological Exam  Physical Exam Physical Examination:   Vitals:   04/05/22 0837  BP: 131/76  Pulse: 69    General Examination: The patient is a very pleasant 67 y.o. female in no acute distress. She appears well-developed and well-nourished and well groomed.   HEENT: Normocephalic, atraumatic, pupils are equal, round and reactive to light, corrective eyeglasses in place, extraocular tracking mildly impaired, stable.  Overall mild nuchal rigidity noted, mild facial masking, mild hypophonia, intermittent mild lower lip and jaw tremor.  No dysarthria.  Airway examination reveals moderate mouth dryness. Hearing is grossly intact, bilateral hearing aids in place.     Chest: Clear to auscultation without wheezing, rhonchi or crackles noted.   Heart: S1+S2+0, regular and normal without murmurs, rubs or gallops noted.    Abdomen: Soft, non-tender and non-distended.  Normal bowel  sounds.    Extremities: There is no pitting edema in the distal lower extremities bilaterally.    Skin: Warm and dry without trophic changes noted.    Musculoskeletal: exam reveals no obvious joint deformities.    Neurologically:  Mental status: The patient is awake, alert and oriented in all 4 spheres. Her immediate and remote memory, attention, language skills and fund of knowledge are fairly appropriate, she does not provide a whole lot of details to her history, no active hallucinations.  Speech is as above. Thought process is linear. Mood is normal and affect is flat.  Cranial nerves II - XII are as described above under HEENT exam.  Motor exam: Normal bulk, strength and tone is noted. There is no resting or postural tremor currently.  Romberg is not tested for safety concerns. Moderate bradykinesia is noted.  Fine motor skills are globally mild to moderately impaired, no telltale lateralization noted, perhaps slightly worse on the right. Cebellar testing: No dysmetria or intention tremor. There is no truncal or gait ataxia.  Sensory exam: intact to light touch in the upper and lower extremities.  Gait, station and balance: She stands slowly, posture is mildly stooped for age.  She walks slowly and cautiously, she can walk a little bit without her walker, otherwise maneuvering her rolling walker well.     Assessment and Plan:    In summary, Kabella MARCO ADELSON is a 67 year old female with an underlying complex medical history of asthma, arthritis, anemia, avascular necrosis of the hip, depression, anxiety, type 2 diabetes, reflux disease, hyperlipidemia, hypertension, kidney disease, restless leg syndrome, history of TIA, thyroid disease, vitamin D deficiency, OSA on CPAP and obesity, who presents for follow-up consultation of her parkinsonism, complicated by recent confusion, hallucinations and lethargy.  The symptoms came on fairly suddenly, no evidence of UTI.  I do believe that her symptoms  may have been caused or at least exacerbated by the use of hydrocodone, she is strongly advised no longer to take any hydrocodone or any other narcotic pain medication for that matter.  I discussed this with the patient and her husband.  Her husband also indicates that he has taken over her medications to get them ready for her for the week.  He double checked and it did not look like she took too much of her existing medication.  She does take a number of psychotropic medications unfortunately which complicates the situation. She has benefited from levodopa therapy.  She is currently on Sinemet CR 50-200 mg strength 1 pill 5 times a day and Sinemet IR 25-250 mg strength 1 pill twice daily as well as ropinirole 2 mg twice daily.  She is advised to continue with her medication regimen.  We will go ahead and do an EEG through our office and also some blood work today to look for any other treatable causes of her symptoms which have improved thankfully.  She is advised to follow-up routinely to see one of our nurse practitioners in 4 to 6 months, sooner if needed.  We will call with blood test results and EEG results in the interim.  I answered all the questions today and the patient and her husband were in agreement.   I spent 30 minutes in total face-to-face time and in reviewing records during pre-charting, more than 50% of which was spent in counseling and coordination of care, reviewing test results, reviewing medications and treatment regimen and/or in discussing or reviewing the diagnosis of parkinsonism, the prognosis and treatment options. Pertinent laboratory and imaging test results that were available during this visit with the patient were reviewed by me and considered in my medical decision making (see chart for details).

## 2022-04-05 NOTE — Patient Instructions (Addendum)
It was nice to see you both again. I am glad to hear you are feeling a little better with regards to your hallucinations, confusion and sleepiness.  I do believe that you had side effects from the hydrocodone as you took some before Christmas.  I would say that you absolutely cannot take any narcotic pain medication including hydrocodone, oxycodone, or morphine unless absolutely necessary.  Please do not take any more hydrocodone for your foot pain and try to use Tylenol and follow-up with your foot doctor.  You are highly susceptible to side effects from hydrocodone. We will check blood work today and call you with the test results. We will do an EEG (brainwave test), which we will schedule. We will call you with the results.

## 2022-04-06 ENCOUNTER — Other Ambulatory Visit: Payer: Self-pay

## 2022-04-06 ENCOUNTER — Ambulatory Visit: Payer: Commercial Managed Care - PPO | Attending: Adult Health | Admitting: Occupational Therapy

## 2022-04-06 ENCOUNTER — Ambulatory Visit: Payer: Commercial Managed Care - PPO | Admitting: Physical Therapy

## 2022-04-06 ENCOUNTER — Other Ambulatory Visit (HOSPITAL_BASED_OUTPATIENT_CLINIC_OR_DEPARTMENT_OTHER): Payer: Self-pay

## 2022-04-06 DIAGNOSIS — R2689 Other abnormalities of gait and mobility: Secondary | ICD-10-CM | POA: Insufficient documentation

## 2022-04-06 DIAGNOSIS — R2681 Unsteadiness on feet: Secondary | ICD-10-CM | POA: Insufficient documentation

## 2022-04-06 DIAGNOSIS — R278 Other lack of coordination: Secondary | ICD-10-CM | POA: Diagnosis not present

## 2022-04-06 DIAGNOSIS — Z9181 History of falling: Secondary | ICD-10-CM | POA: Insufficient documentation

## 2022-04-06 DIAGNOSIS — R29818 Other symptoms and signs involving the nervous system: Secondary | ICD-10-CM | POA: Diagnosis not present

## 2022-04-06 DIAGNOSIS — R293 Abnormal posture: Secondary | ICD-10-CM | POA: Insufficient documentation

## 2022-04-06 DIAGNOSIS — R41844 Frontal lobe and executive function deficit: Secondary | ICD-10-CM | POA: Diagnosis not present

## 2022-04-06 DIAGNOSIS — M6281 Muscle weakness (generalized): Secondary | ICD-10-CM | POA: Insufficient documentation

## 2022-04-06 DIAGNOSIS — R41840 Attention and concentration deficit: Secondary | ICD-10-CM | POA: Insufficient documentation

## 2022-04-06 LAB — CBC WITH DIFFERENTIAL/PLATELET
Basophils Absolute: 0 10*3/uL (ref 0.0–0.2)
Basos: 0 %
EOS (ABSOLUTE): 0.1 10*3/uL (ref 0.0–0.4)
Eos: 1 %
Hematocrit: 36 % (ref 34.0–46.6)
Hemoglobin: 12.6 g/dL (ref 11.1–15.9)
Immature Grans (Abs): 0 10*3/uL (ref 0.0–0.1)
Immature Granulocytes: 1 %
Lymphocytes Absolute: 2.3 10*3/uL (ref 0.7–3.1)
Lymphs: 32 %
MCH: 34.4 pg — ABNORMAL HIGH (ref 26.6–33.0)
MCHC: 35 g/dL (ref 31.5–35.7)
MCV: 98 fL — ABNORMAL HIGH (ref 79–97)
Monocytes Absolute: 0.7 10*3/uL (ref 0.1–0.9)
Monocytes: 10 %
Neutrophils Absolute: 4.1 10*3/uL (ref 1.4–7.0)
Neutrophils: 56 %
Platelets: 255 10*3/uL (ref 150–450)
RBC: 3.66 x10E6/uL — ABNORMAL LOW (ref 3.77–5.28)
RDW: 12.4 % (ref 11.7–15.4)
WBC: 7.2 10*3/uL (ref 3.4–10.8)

## 2022-04-06 LAB — COMPREHENSIVE METABOLIC PANEL
ALT: 10 IU/L (ref 0–32)
AST: 18 IU/L (ref 0–40)
Albumin/Globulin Ratio: 1.5 (ref 1.2–2.2)
Albumin: 4.3 g/dL (ref 3.9–4.9)
Alkaline Phosphatase: 54 IU/L (ref 44–121)
BUN/Creatinine Ratio: 14 (ref 12–28)
BUN: 19 mg/dL (ref 8–27)
Bilirubin Total: 0.3 mg/dL (ref 0.0–1.2)
CO2: 23 mmol/L (ref 20–29)
Calcium: 9.8 mg/dL (ref 8.7–10.3)
Chloride: 100 mmol/L (ref 96–106)
Creatinine, Ser: 1.32 mg/dL — ABNORMAL HIGH (ref 0.57–1.00)
Globulin, Total: 2.8 g/dL (ref 1.5–4.5)
Glucose: 110 mg/dL — ABNORMAL HIGH (ref 70–99)
Potassium: 5.5 mmol/L — ABNORMAL HIGH (ref 3.5–5.2)
Sodium: 138 mmol/L (ref 134–144)
Total Protein: 7.1 g/dL (ref 6.0–8.5)
eGFR: 44 mL/min/{1.73_m2} — ABNORMAL LOW (ref >59)

## 2022-04-06 LAB — AMMONIA: Ammonia: 55 ug/dL (ref 34–178)

## 2022-04-06 LAB — B12 AND FOLATE PANEL
Folate: 8 ng/mL (ref >3.0)
Vitamin B-12: 255 pg/mL (ref 232–1245)

## 2022-04-06 LAB — TSH: TSH: 1.2 u[IU]/mL (ref 0.450–4.500)

## 2022-04-06 NOTE — Therapy (Signed)
OUTPATIENT PHYSICAL THERAPY NEURO TREATMENT- DISCHARGE SUMMARY    Patient Name: Deanna Rowe MRN: [id number] DOB:1955-10-25, 67 y.o., female Today's Date: 04/06/2022   PCP: Aretta Nip, MD REFERRING PROVIDER: Ward Givens, NP   PHYSICAL THERAPY DISCHARGE SUMMARY  Visits from Start of Care: 19  Current functional level related to goals / functional outcomes: Pt is grossly mod I-S* w/all mobility w/use of rollator. Using [facility] for very short household distances    Remaining deficits: Impaired balance, gait deficits 2/2 PD, impaired safety awareness    Education / Equipment: HEP   Patient agrees to discharge. Patient goals were met. Patient is being discharged due to meeting the stated rehab goals.    PT End of Session - 04/06/22 0848     Visit Number 19    Number of Visits 23    Date for PT Re-Evaluation 05/14/22   due to delay in scheduling/pt going on vacation   Authorization Type [facility]    PT Start Time [phone]    PT Stop Time 0930    PT Time Calculation (min) 44 min    Equipment Utilized During Treatment Gait belt    Activity Tolerance Patient tolerated treatment well    Behavior During Therapy [facility] for tasks assessed/performed                         Past Medical History:  Diagnosis Date   Anemia    Anxiety    Arthritis    L HIP   Asthma    Avascular necrosis of hip ([place])    LEFT   Back pain    Chest pain    Constipation    Depression    Diabetes mellitus    Diabetes mellitus, type II (HCC)    Dry cough    Edema, lower extremity    Gait abnormality 11/18/2019   GERD (gastroesophageal reflux disease)    High cholesterol    Hypertension    Insomnia    takes Ambien nightly   Joint pain    Kidney disease    Kidney disease    Obesity    Parkinsonism 03/02/2017   PONV (postoperative nausea and vomiting)    RLS (restless legs syndrome) 01/15/2018   Sleep apnea    Swallowing difficulty    Thyroid disease    TIA  (transient ischemic attack) 2012   no residual problems   Urgency incontinence    Vitamin D deficiency    Past Surgical History:  Procedure Laterality Date   DILATION AND CURETTAGE OF UTERUS     ESOPHAGEAL MANOMETRY N/A 09/03/2012   Procedure: ESOPHAGEAL MANOMETRY (EM);  Surgeon: Garlan Fair, MD;  Location: [facility];  Service: Endoscopy;  Laterality: N/A;   ESOPHAGEAL MANOMETRY N/A 06/19/2017   Procedure: ESOPHAGEAL MANOMETRY (EM);  Surgeon: Clarene Essex, MD;  Location: [facility];  Service: Endoscopy;  Laterality: N/A;   EXPLORATORY LAPAROTOMY     GANGLION CYST EXCISION     JOINT REPLACEMENT  2011   rt total hip   KNEE ARTHROSCOPY     PILONIDAL CYST / SINUS EXCISION     TOTAL HIP ARTHROPLASTY Left 10/09/2013   Procedure: LEFT TOTAL HIP ARTHROPLASTY ANTERIOR APPROACH;  Surgeon: [name], MD;  Location: [facility];  Service: Orthopedics;  Laterality: Left;   Patient Active Problem List   Diagnosis Date Noted   Allergic conjunctivitis of both eyes 07/06/2020   Gait abnormality 11/18/2019   OSA on  CPAP 06/04/2019   Pain in joint of left shoulder 03/28/2019   Seasonal allergic conjunctivitis 02/07/2019   Chronic intermittent hypoxia with obstructive sleep apnea 01/25/2019   Severe obstructive sleep apnea 01/25/2019   Excessive daytime sleepiness 01/03/2019   Non-restorative sleep 01/03/2019   Nocturia more than twice per night 01/03/2019   Weight gain, abnormal 01/03/2019   Snorings 01/03/2019   Trochanteric bursitis of right hip 04/27/2018   Encounter for routine screening for malformation using ultrasonics 02/08/2018   Abnormal weight gain 02/08/2018   Acid indigestion 02/08/2018   Acute antritis 02/08/2018   Class 2 severe obesity with serious comorbidity and body mass index (BMI) of 39.0 to 39.9 in adult ([place]) 02/08/2018   Perennial allergic rhinitis 02/08/2018   Antiphospholipid syndrome ([place]) 02/08/2018   Arthralgia of hip or thigh 02/08/2018   Asthma, cough  variant 02/08/2018   Vitamin D deficiency 02/08/2018   Backhand tennis elbow 02/08/2018   Cannot sleep 02/08/2018   Chronic kidney disease (CKD), stage III (moderate) ([place]) 02/08/2018   Current drug use 02/08/2018   Depression, major, single episode, in partial remission ([place]) 02/08/2018   Diabetes mellitus with polyneuropathy ([place]) 02/08/2018   Difficulty hearing 02/08/2018   Disturbance of skin sensation 02/08/2018   Hypertension associated with diabetes ([place]) 02/08/2018   Extremity pain 02/08/2018   Facet syndrome, lumbar 02/08/2018   Factor V Leiden ([place]) 02/08/2018   Fungal infection of nail 02/08/2018   Gastroenteritis and colitis, viral 02/08/2018   Hypoglycemia 02/08/2018   Menopause 02/08/2018   Other long term (current) drug therapy 02/08/2018   Other osteonecrosis, unspecified femur ([place]) 02/08/2018   Pure hypercholesterolemia 02/08/2018   Recurrent major depressive episodes ([place]) 02/08/2018   Syncope and collapse 02/08/2018   Not well controlled moderate persistent asthma 02/08/2018   Cough, persistent 02/08/2018   Restless leg syndrome 01/15/2018   Degeneration of lumbar intervertebral disc 11/21/2017   Tremor of right hand 03/02/2017   Parkinson disease 03/02/2017   UTI (urinary tract infection) 12/01/2015   Type 2 diabetes mellitus with stage 3b chronic kidney disease, with long-term current use of insulin ([place]) 06/03/2015   Fast heart beat 04/09/2015   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 2 diabetes mellitus ([place]) 04/24/2014   Postoperative anemia due to acute blood loss 10/10/2013   Avascular necrosis of bone of left hip ([place]) 10/09/2013   Arthritis of pelvic region, degenerative 10/09/2013   Breath shortness 06/18/2013   Diabetes mellitus type 2, uncontrolled 05/31/2013   Intermittent claudication ([place]) 04/04/2013   Anxiety    Gastroesophageal reflux disease 09/12/2012   Clinical depression 02/21/2012   Stroke ([place]) 02/11/2012   History of revision of total replacement  of right hip joint 04/04/2009    ONSET DATE: 12/02/2021   REFERRING DIAG: G20 (ICD-10-CM) - Parkinson disease ([place]) R29.6 (ICD-10-CM) - Falls frequently   THERAPY DIAG:  Other abnormalities of gait and mobility  Muscle weakness (generalized)  Unsteadiness on feet  History of falling  Rationale for Evaluation and Treatment Rehabilitation  SUBJECTIVE:  SUBJECTIVE STATEMENT: Pt reports feeling better today, no falls. Interested in going to [place] to participate in PD classes. Excited to DC today.   Pt accompanied by: significant other  PERTINENT HISTORY: Parkinsonism (2018),TIA, thyroid disease, kidney disease, HTN, HLD, GERD, DMII, depression, L hip avascular necrosis, anxiety, anemia, R THA 2011, L THA 2015, knee arthroscopy   PAIN:  Are you having pain? Yes: NPRS scale: 6/10 Pain location: R foot Pain description: Hervey Ard, achy   PRECAUTIONS: Fall, No driving.   FALLS: Has patient fallen in last 6 months? Yes. Number of falls 10  VITALS There were no vitals filed for this visit.     PLOF: Independent with community mobility with device  PATIENT GOALS Wants to stop falling, improve balance.    OBJECTIVE:   COGNITION: Overall cognitive status: Impaired  TODAY'S TREATMENT Ther Act  LTG Assessment   [facility] PT Assessment - 04/06/22 0857       Transfers   Five time sit to stand comments  13.78s without UE support      Ambulation/Gait   Gait velocity 32.8' over13.31s = 2.7f/s w/rollator mod I            STAIRS:  Level of Assistance: Modified independence Stair Negotiation Technique: Step to Pattern with Single Rail on Right and SPC  Number of Stairs: 12   Height of Stairs: 6"  Comments: Pt able to properly ascend/descend and sequence SPC without cues. No  instability noted   Lateral push and release test: 2 steps each direction    GAIT: Gait pattern:  genu valgus of RLE, step through pattern, decreased stride length, Right foot flat, Left foot flat, decreased trunk rotation, trunk flexed, and narrow BOS Distance walked: Clinic distances. Assistive device utilized: WEnvironmental consultant- 4 wheeled Level of assistance: Mod I - S* Comments: No cues required for proper management of AD today and pt able to maintain close distance to walker through tight spaces and around obstacles in clinic.   Ther Ex  SciFit multi-peaks level 9 for 10 minutes using BUE/BLEs for neural priming for reciprocal movement, dynamic cardiovascular conditioning and increased amplitude of stepping. RPE of 1/10 following activity    PATIENT EDUCATION: Education details: Continue HEP, goal outcomes, [place]  Person educated: Patient and Spouse Education method: Explanation Education comprehension: verbalized understanding and returned demonstration   HOME EXERCISE PROGRAM: Access Code: [email or web address]: [email or web address] Date: 12/22/2021 Prepared by: JMickie BailPlaster  Exercises - Sit to Stand Without Arm Support  - 1 x daily - 7 x weekly - 2 sets - 5-6 reps - Heel Raises with Counter Support  - 1 x daily - 7 x weekly - 3 sets - 10 reps - Step sideways with arms reaching at counter   - 1 x daily - 7 x weekly - 3 sets - 10 reps (same as PWR step) - Alternating Step Backward with Support  - 1 x daily - 7 x weekly - 3 sets - 10 reps - Standing Quarter Turn with Counter Support  - 1 x daily - 7 x weekly - 3 sets - 10 reps - Seated Hamstring Stretch  - 1 x daily - 7 x weekly - 1 sets - 3-4 reps - 30 second hold - Calf stretch  - 1 x daily - 7 x weekly - 1 sets - 3-4 reps - 30-45 second hold   Standing PWR moves (with supervision from husband and having chair in front of  pt)   GOALS: Goals reviewed with  patient? Yes   UPDATED/ONGOING LTGS FOR RE-CERT LONG TERM GOALS: Target date: 04/01/22 (UPDATED DATE)  Pt will be independent with final HEP for PD specific deficits/balance in order to build upon functional gains made in therapy. Baseline: pt performing at home, will benefit from updates/additions  Goal status: MET   2.  Pt perform L lateral stepping strategy in 1-2 steps in order to demo improved balance recovery.  Baseline: 2-3 steps on 03/15/22; 2 steps  Goal status: MET   3.  Pt will improve gait speed with rollator to at least 2.4 ft/sec in order to demo improved community mobility.  Baseline: 15.8 seconds with rollator = 2.06 ft/sec, pt only using cane now when going up and down the stairs; 2.5 ft/s w/rollator mod I  Goal status: MET  4.  Pt will improve 5x sit<>stand to less than or equal to 16 sec with no UE support from lower surfaces to demonstrate improved functional strength and transfer efficiency.  Baseline: 18.5 seconds with no UE support from lower chair; 13.78s without UE support Goal status: MET   5.  Pt will perform at least 12 steps with use of cane with proper sequencing with mod I in order to demo improved safety with stairs at home.  Baseline: supervision/CGA Goal status: MET  ASSESSMENT:  CLINICAL IMPRESSION: Emphasis of skilled PT session on LTG assessment and DC from PT. Pt has met 5 of 5 LTGs and demonstrates significant improvement in management of AD and proper sequencing w/stairs. Pt has improved her gait speed to 2.5 ft/s with rollator and performs 5x STS without UE support or retropulsion in <14s. Pt has also improved her stepping strategies, requiring only 2 steps on L and R side to recover balance during push and release test. Pt and husband verbalize and demonstrate good understanding of pt's HEP and have implemented at home regularly. Pt in agreement to DC from PT today due to meeting all her goals and being satisfied with current functional level. Pt  is scheduled for PD evals in 3 months.   OBJECTIVE IMPAIRMENTS Abnormal gait, decreased activity tolerance, decreased balance, decreased coordination, difficulty walking, decreased strength, impaired flexibility, impaired tone, and postural dysfunction.   ACTIVITY LIMITATIONS bending, squatting, stairs, transfers, and locomotion level  PARTICIPATION LIMITATIONS: driving and community activity  PERSONAL FACTORS Behavior pattern, Past/current experiences, Time since onset of injury/illness/exacerbation, and 3+ comorbidities: Parkinsonism (2018),TIA, thyroid disease, kidney disease, HTN, HLD, GERD, DMII, depression, L hip avascular necrosis, anxiety, anemia, R THA 2011, L THA 2015, knee arthroscopy   are also affecting patient's functional outcome.   REHAB POTENTIAL: Good  CLINICAL DECISION MAKING: Evolving/moderate complexity  EVALUATION COMPLEXITY: Moderate  PLAN: PT FREQUENCY: 2x/week  PT DURATION: 8 weeks  PLANNED INTERVENTIONS: Therapeutic exercises, Therapeutic activity, Neuromuscular re-education, Balance training, Gait training, Self Care, Stair training, Vestibular training, DME instructions, Manual therapy, and Re-evaluation   Deanna Rowe, PT, DPT  04/06/2022, 9:30 AM

## 2022-04-06 NOTE — Therapy (Signed)
OUTPATIENT OCCUPATIONAL THERAPY [facility]   Patient Name: Deanna Rowe MRN: [id number] DOB:[birth year], 67 y.o., female Today's Date: 04/06/2022  PCP: Dr. Radene Ou REFERRING PROVIDER: Ward Givens NP OCCUPATIONAL THERAPY DISCHARGE SUMMARY    Current functional level related to goals / functional outcomes: Pt met all goals and she demonstrates good overall progress.   Remaining deficits: Cognitive deficits, visual deficits, decreased balance, decreased coordination, bradykinesia   Education / Equipment: Pt/ husband were educated regarding: HEP, compensation for visual deficits, education regarding tips for freezing episodes  and ADL strategies. They verbalize understanding of all education.   Patient agrees to discharge. Patient goals were met. Patient is being discharged due to meeting the stated rehab goals. Return evaluations recommended in approximately 4 months. Pt appears to have recovered from her setback related to taking narcotic medication.      OT End of Session - 04/06/22 0933     Visit Number 16    Number of Visits 25    Date for OT Re-Evaluation 04/14/22    Authorization Type [facility], MCR secondary    Authorization - Visit Number 15    OT Start Time 0932    OT Stop Time 9233    OT Time Calculation (min) 36 min    Activity Tolerance Patient tolerated treatment well    Behavior During Therapy WFL for tasks assessed/performed                   Past Medical History:  Diagnosis Date   Anemia    Anxiety    Arthritis    L HIP   Asthma    Avascular necrosis of hip ([place])    LEFT   Back pain    Chest pain    Constipation    Depression    Diabetes mellitus    Diabetes mellitus, type II (HCC)    Dry cough    Edema, lower extremity    Gait abnormality 11/18/2019   GERD (gastroesophageal reflux disease)    High cholesterol    Hypertension    Insomnia    takes Ambien nightly   Joint pain    Kidney disease    Kidney disease     Obesity    Parkinsonism 03/02/2017   PONV (postoperative nausea and vomiting)    RLS (restless legs syndrome) 01/15/2018   Sleep apnea    Swallowing difficulty    Thyroid disease    TIA (transient ischemic attack) 2012   no residual problems   Urgency incontinence    Vitamin D deficiency    Past Surgical History:  Procedure Laterality Date   DILATION AND CURETTAGE OF UTERUS     ESOPHAGEAL MANOMETRY N/A 09/03/2012   Procedure: ESOPHAGEAL MANOMETRY (EM);  Surgeon: Garlan Fair, MD;  Location: [facility];  Service: Endoscopy;  Laterality: N/A;   ESOPHAGEAL MANOMETRY N/A 06/19/2017   Procedure: ESOPHAGEAL MANOMETRY (EM);  Surgeon: Clarene Essex, MD;  Location: [facility];  Service: Endoscopy;  Laterality: N/A;   EXPLORATORY LAPAROTOMY     GANGLION CYST EXCISION     JOINT REPLACEMENT  2011   rt total hip   KNEE ARTHROSCOPY     PILONIDAL CYST / SINUS EXCISION     TOTAL HIP ARTHROPLASTY Left 10/09/2013   Procedure: LEFT TOTAL HIP ARTHROPLASTY ANTERIOR APPROACH;  Surgeon: [name], MD;  Location: [facility];  Service: Orthopedics;  Laterality: Left;   Patient Active Problem List   Diagnosis Date Noted   Allergic conjunctivitis of both eyes  07/06/2020   Gait abnormality 11/18/2019   OSA on CPAP 06/04/2019   Pain in joint of left shoulder 03/28/2019   Seasonal allergic conjunctivitis 02/07/2019   Chronic intermittent hypoxia with obstructive sleep apnea 01/25/2019   Severe obstructive sleep apnea 01/25/2019   Excessive daytime sleepiness 01/03/2019   Non-restorative sleep 01/03/2019   Nocturia more than twice per night 01/03/2019   Weight gain, abnormal 01/03/2019   Snorings 01/03/2019   Trochanteric bursitis of right hip 04/27/2018   Encounter for routine screening for malformation using ultrasonics 02/08/2018   Abnormal weight gain 02/08/2018   Acid indigestion 02/08/2018   Acute antritis 02/08/2018   Class 2 severe obesity with serious comorbidity and body mass index (BMI)  of 39.0 to 39.9 in adult [facility]) 02/08/2018   Perennial allergic rhinitis 02/08/2018   Antiphospholipid syndrome ([place]) 02/08/2018   Arthralgia of hip or thigh 02/08/2018   Asthma, cough variant 02/08/2018   Vitamin D deficiency 02/08/2018   Backhand tennis elbow 02/08/2018   Cannot sleep 02/08/2018   Chronic kidney disease (CKD), stage III (moderate) ([place]) 02/08/2018   Current drug use 02/08/2018   Depression, major, single episode, in partial remission ([place]) 02/08/2018   Diabetes mellitus with polyneuropathy ([place]) 02/08/2018   Difficulty hearing 02/08/2018   Disturbance of skin sensation 02/08/2018   Hypertension associated with diabetes ([place]) 02/08/2018   Extremity pain 02/08/2018   Facet syndrome, lumbar 02/08/2018   Factor V Leiden ([place]) 02/08/2018   Fungal infection of nail 02/08/2018   Gastroenteritis and colitis, viral 02/08/2018   Hypoglycemia 02/08/2018   Menopause 02/08/2018   Other long term (current) drug therapy 02/08/2018   Other osteonecrosis, unspecified femur ([place]) 02/08/2018   Pure hypercholesterolemia 02/08/2018   Recurrent major depressive episodes ([place]) 02/08/2018   Syncope and collapse 02/08/2018   Not well controlled moderate persistent asthma 02/08/2018   Cough, persistent 02/08/2018   Restless leg syndrome 01/15/2018   Degeneration of lumbar intervertebral disc 11/21/2017   Tremor of right hand 03/02/2017   Parkinson disease 03/02/2017   UTI (urinary tract infection) 12/01/2015   Type 2 diabetes mellitus with stage 3b chronic kidney disease, with long-term current use of insulin ([place]) 06/03/2015   Fast heart beat 04/09/2015   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 2 diabetes mellitus ([place]) 04/24/2014   Postoperative anemia due to acute blood loss 10/10/2013   Avascular necrosis of bone of left hip ([place]) 10/09/2013   Arthritis of pelvic region, degenerative 10/09/2013   Breath shortness 06/18/2013   Diabetes mellitus type 2, uncontrolled 05/31/2013   Intermittent  claudication ([place]) 04/04/2013   Anxiety    Gastroesophageal reflux disease 09/12/2012   Clinical depression 02/21/2012   Stroke ([place]) 02/11/2012   History of revision of total replacement of right hip joint 04/04/2009    ONSET DATE: 12/15/21  REFERRING DIAG: Parkinsonism  THERAPY DIAG:  Muscle weakness (generalized)  Abnormal posture  Other lack of coordination  Frontal lobe and executive function deficit  Attention and concentration deficit  Unsteadiness on feet  Other symptoms and signs involving the nervous system  Rationale for Evaluation and Treatment Rehabilitation  SUBJECTIVE:   SUBJECTIVE STATEMENT: Pt's husband reports she slept most of the day yesterday Pt accompanied by: self, husband  PERTINENT HISTORY: PERTINENT HISTORY: Parkinsonism (2018),TIA, thyroid disease, kidney disease, HTN, HLD, GERD, DMII, depression, L hip avascular necrosis, anxiety, anemia, R THA 2011, L THA 2015, knee arthroscopy   PRECAUTIONS: Fall  WEIGHT BEARING RESTRICTIONS No  PAIN: no reports of pain today   FALLS:  Has patient fallen in last 6 months? Yes. Number of falls 5  LIVING ENVIRONMENT: Lives with: lives with their spouse Lives in: House/apartment  PLOF: Independent  PATIENT GOALS to be able style hair  OBJECTIVE: from eval   HAND DOMINANCE: Right   FUNCTIONAL OUTCOME MEASURES: Fastening/unfastening 3 buttons: 50.68 Physical performance test: PPT#2 (simulated eating) 15.03 & PPT#4 (donning/doffing jacket): 13.34  COORDINATION: 9 Hole Peg test: Right: 36.91 sec; Left: 38.16 sec Box and Blocks:  Right 33blocks, Left 37blocks   TODAY'S TREATMENT:  Pt was accompanied by her husband. Pt is doing much better today. Pt's confusion was attributed to taking narcotic pain medication. Therapist checked progress towards remaining goal.Reviewed coordination exercises for flipping and dealing cards, stacking and manipulating coins with big, deliberate movements,   Education performed regarding compensation for visual deficits (Pt is unable to track inferiorly)     PATIENT EDUCATION: Education details:  checked progress towards goals, reviewed coordination exercises for flipping and dealing cards, stacking and manipulating coins with big, deliberate movements, compensation for visual deficits, education regarding tips for freezing episodes as pt was experiencing when she took the narcotic medication Person educated: Patient and Spouse Education method: Explanation,  demonstration, verbal cues, handouts Education comprehension: verbalized understanding, returned demo, verbal cues required   [place]: 12/27/21: PWR! Seated, coordination HEP, writing strategies 12/30/21: PWR! Hands  02/03/22- PWR! Supine basic 4 02/15/22- recommendation for staggered feet and use of rollator for laundry, education regarding pt visual deficits, inability to track inferiorly. 03/08/22: Bag ex's for ADLS   ASSESSMENT:  CLINICAL IMPRESSION: Pt is progressing towards goals. Pt will benefit from reinforcement of coordination HEP. Pt continues to be limited by cognitive and visual deficits. PERFORMANCE DEFICITS in functional skills including ADLs, IADLs, coordination, dexterity, proprioception, sensation, tone, ROM, strength, pain, flexibility, FMC, GMC, mobility, balance, endurance, decreased knowledge of precautions, decreased knowledge of use of DME, and UE functional use, bradykinesia , cognitive skills including attention, energy/drive, learn, memory, problem solving, safety awareness, thought, and understand, and psychosocial skills including coping strategies, environmental adaptation, habits, interpersonal interactions, and routines and behaviors.   IMPAIRMENTS are limiting patient from ADLs, IADLs, play, leisure, and social participation.   COMORBIDITIES may have co-morbidities  that affects occupational performance. Patient will benefit from skilled OT  to address above impairments and improve overall function.  MODIFICATION OR ASSISTANCE TO COMPLETE EVALUATION: No modification of tasks or assist necessary to complete an evaluation.  OT OCCUPATIONAL PROFILE AND HISTORY: Detailed assessment: Review of records and additional review of physical, cognitive, psychosocial history related to current functional performance.  CLINICAL DECISION MAKING: LOW - limited treatment options, no task modification necessary  REHAB POTENTIAL: Good  EVALUATION COMPLEXITY: Low     GOALS: Goals reviewed with patient? No  SHORT TERM GOALS: Target date: 02/21/22- extended due to scheduling  I with PD specific HEP Baseline: Goal status: MET (w/ caregiver cues)   2.  Pt will verbalize understanding of adapted strategies to maximize safety and I with ADLs/ IADLs .  Baseline:  Goal status: MET  3.  Pt will demonstrate improved bilateral UE functional use as evidenced by increasing box/ blocks score by 3 blocks Baseline: RUE 33, LUE 37 Goal status: MET (Rt = 38, Lt = 40)   4.  Pt will demonstrate improved ease with feeding as evidenced by decreasing PPT#2 (self feeding) by 3 secs Baseline: 15.03 Goal status: MET (9.06 sec)   5.  Pt will report increased ease with styling her hair Baseline:  Pt reports difficulty Goal status: met, per pt report    LONG TERM GOALS: Target date: 03/17/22-  1.Pt will verbalize understanding of ways to prevent future PD related complications and PD community resources.  Baseline:  Goal status:met, PT provided community resources, therapist provided education regarding preventing future PD complications.  2.  Pt will demonstrate improved fine motor coordination for ADLs as evidenced by decreasing bilateral  9 hole peg test score for by 3 secs  Baseline: RUE 36.91, LUE 38.16 Goal status: met  RUE 29.19 LUE 34.78  3.  Pt will demonstrate understanding of memory compensations and ways to keep thinking skills  sharp  Baseline:  Goal status: met 4.  Pt will demonstrate improved ease with fastening buttons as evidenced by decreasing 3 button/ unbutton time to 47 secs or less  Baseline: 50.68 Goal status:  met 42.66   PLAN: OT FREQUENCY: 2x/week  OT DURATION: 4 weeks  PLANNED INTERVENTIONS: self care/ADL training, therapeutic exercise, therapeutic activity, neuromuscular re-education, manual therapy, passive range of motion, balance training, stair training, functional mobility training, aquatic therapy, ultrasound, paraffin, fluidotherapy, moist heat, cryotherapy, contrast bath, patient/family education, cognitive remediation/compensation, visual/perceptual remediation/compensation, energy conservation, coping strategies training, DME and/or AE instructions, and Re-evaluation  RECOMMENDED OTHER SERVICES: PT  CONSULTED AND AGREED WITH PLAN OF CARE: Patient  PLAN FOR NEXT SESSIONTheone Murdoch, OTR/L [phone] Phone: [phone] 10:42 AM 04/06/22  04/06/22 10:42 AM Phone [phone] FAX [phone]

## 2022-04-06 NOTE — Patient Instructions (Addendum)
You have difficulty scanning inferiorly(down) you will need to use head movements to compensate Use extra caution when entering new environments, or stepping off curbs or climbing stairs Make sure to look down using head movements   1. Look for the edge of objects (to the left and/or right, and up/ down) so that you make sure you are seeing all of an object 2. Turn your head when walking, scan from side to side, and up and down particularly in busy environments 3. Use an organized scanning pattern. It's usually easier to scan from top to bottom, and left to right (like you are reading) 4. Double check yourself 5. Use a line guide (like a blank piece of paper) or your finger when reading

## 2022-04-07 ENCOUNTER — Ambulatory Visit (INDEPENDENT_AMBULATORY_CARE_PROVIDER_SITE_OTHER): Payer: Commercial Managed Care - PPO | Admitting: Neurology

## 2022-04-07 ENCOUNTER — Telehealth: Payer: Self-pay | Admitting: *Deleted

## 2022-04-07 ENCOUNTER — Other Ambulatory Visit (HOSPITAL_BASED_OUTPATIENT_CLINIC_OR_DEPARTMENT_OTHER): Payer: Self-pay

## 2022-04-07 DIAGNOSIS — T50905S Adverse effect of unspecified drugs, medicaments and biological substances, sequela: Secondary | ICD-10-CM

## 2022-04-07 DIAGNOSIS — G20C Parkinsonism, unspecified: Secondary | ICD-10-CM

## 2022-04-07 DIAGNOSIS — R410 Disorientation, unspecified: Secondary | ICD-10-CM

## 2022-04-07 DIAGNOSIS — R443 Hallucinations, unspecified: Secondary | ICD-10-CM

## 2022-04-07 NOTE — Telephone Encounter (Signed)
LVM for patient to call back to review lab work results

## 2022-04-07 NOTE — Telephone Encounter (Signed)
-----   Message from Star Age, MD sent at 04/06/2022  6:18 PM EST ----- Mild impairment of kidney function, mildly elevated potassium, normal thyroid function and low normal vitamin B12. I recommend she hydrated well with water and follow up with PCP for a check up and repeat labs in one month or sooner per PCP recommendations. Please notify patient or husband.

## 2022-04-07 NOTE — Telephone Encounter (Addendum)
Spoke to Patient and husband .Pt was at [facility] this am for EEG . Gave Lab results in  person.  Gave Dr. Rexene Alberts Recommendation . Pt and husband express  understanding. Per Husband pt already sees a Elmarie Shiley ,MD at [facility]. Husband wants to forward lab results to [facility] Patel,MD . Will forward this am

## 2022-04-11 ENCOUNTER — Ambulatory Visit: Payer: Commercial Managed Care - PPO | Admitting: Occupational Therapy

## 2022-04-11 ENCOUNTER — Other Ambulatory Visit (HOSPITAL_BASED_OUTPATIENT_CLINIC_OR_DEPARTMENT_OTHER): Payer: Self-pay

## 2022-04-11 DIAGNOSIS — Z794 Long term (current) use of insulin: Secondary | ICD-10-CM | POA: Diagnosis not present

## 2022-04-11 DIAGNOSIS — E1165 Type 2 diabetes mellitus with hyperglycemia: Secondary | ICD-10-CM | POA: Diagnosis not present

## 2022-04-12 ENCOUNTER — Other Ambulatory Visit (HOSPITAL_BASED_OUTPATIENT_CLINIC_OR_DEPARTMENT_OTHER): Payer: Self-pay

## 2022-04-12 NOTE — Procedures (Signed)
    History:  67 year old man with episode of confusion. EEG to rule out seizure  EEG classification: Awake and drowsy  Description of the recording: The background rhythms of this recording consists of a fairly well modulated medium amplitude alpha rhythm of 8-9 Hz that is reactive to eye opening and closure. Present in the anterior head region is a 15-20 Hz beta activity. Photic stimulation was performed, did not show any abnormalities. Hyperventilation was also performed, did not show any abnormalities. Drowsiness was manifested by background fragmentation. No abnormal epileptiform discharges seen during this recording. There was no focal slowing. There were no electrographic seizure identified.   Abnormality: None   Impression: This is a normal EEG recorded while drowsy and awake. No evidence of interictal epileptiform discharges. Normal EEGs, however, do not rule out epilepsy.    Alric Ran, MD [facility]

## 2022-04-13 DIAGNOSIS — R809 Proteinuria, unspecified: Secondary | ICD-10-CM | POA: Diagnosis not present

## 2022-04-13 DIAGNOSIS — N1832 Chronic kidney disease, stage 3b: Secondary | ICD-10-CM | POA: Diagnosis not present

## 2022-04-13 LAB — COMPREHENSIVE METABOLIC PANEL WITH GFR: eGFR: 48

## 2022-04-13 LAB — BASIC METABOLIC PANEL WITH GFR: Creatinine: 1.2 — AB (ref 0.5–1.1)

## 2022-04-14 ENCOUNTER — Other Ambulatory Visit (HOSPITAL_BASED_OUTPATIENT_CLINIC_OR_DEPARTMENT_OTHER): Payer: Self-pay

## 2022-04-15 ENCOUNTER — Other Ambulatory Visit (HOSPITAL_BASED_OUTPATIENT_CLINIC_OR_DEPARTMENT_OTHER): Payer: Self-pay

## 2022-04-15 ENCOUNTER — Ambulatory Visit (INDEPENDENT_AMBULATORY_CARE_PROVIDER_SITE_OTHER): Payer: Commercial Managed Care - PPO | Admitting: Podiatry

## 2022-04-15 VITALS — BP 128/78 | HR 81

## 2022-04-15 DIAGNOSIS — M722 Plantar fascial fibromatosis: Secondary | ICD-10-CM

## 2022-04-15 NOTE — Progress Notes (Signed)
Chief Complaint  Patient presents with   Plantar Fasciitis    Right foot plantar fasciitis follow-up,TX: injection(only helped for a day) Diabetic A1c-6.0 BG- 118    Subjective: 67 y.o. female PMHx diabetes mellitus with polyneuropathy, Parkinson's presenting for follow-up evaluation of plantar fasciitis to the right foot.  Patient states the injection only helped for about 1 day.  She is currently not taking the meloxicam.  Unfortunately there has been minimal improvement since last visit.  She presents for further treatment and evaluation   Past Medical History:  Diagnosis Date   Anemia    Anxiety    Arthritis    L HIP   Asthma    Avascular necrosis of hip ([place])    LEFT   Back pain    Chest pain    Constipation    Depression    Diabetes mellitus    Diabetes mellitus, type II (HCC)    Dry cough    Edema, lower extremity    Gait abnormality 11/18/2019   GERD (gastroesophageal reflux disease)    High cholesterol    Hypertension    Insomnia    takes Ambien nightly   Joint pain    Kidney disease    Kidney disease    Obesity    Parkinsonism 03/02/2017   PONV (postoperative nausea and vomiting)    RLS (restless legs syndrome) 01/15/2018   Sleep apnea    Swallowing difficulty    Thyroid disease    TIA (transient ischemic attack) 2012   no residual problems   Urgency incontinence    Vitamin D deficiency    Past Surgical History:  Procedure Laterality Date   DILATION AND CURETTAGE OF UTERUS     ESOPHAGEAL MANOMETRY N/A 09/03/2012   Procedure: ESOPHAGEAL MANOMETRY (EM);  Surgeon: Garlan Fair, MD;  [facility];  Service: Endoscopy;  Laterality: N/A;   ESOPHAGEAL MANOMETRY N/A 06/19/2017   Procedure: ESOPHAGEAL MANOMETRY (EM);  Surgeon: Clarene Essex, MD;  [facility];  Service: Endoscopy;  Laterality: N/A;   EXPLORATORY LAPAROTOMY     GANGLION CYST EXCISION     JOINT REPLACEMENT  2011   rt total hip   KNEE ARTHROSCOPY     PILONIDAL CYST /  SINUS EXCISION     TOTAL HIP ARTHROPLASTY Left 10/09/2013   Procedure: LEFT TOTAL HIP ARTHROPLASTY ANTERIOR APPROACH;  Surgeon: [name], MD;  [facility];  Service: Orthopedics;  Laterality: Left;   Allergies  Allergen Reactions   Fluticasone-Salmeterol Anaphylaxis   Codeine Nausea And Vomiting   Fetzima [Levomilnacipran] Nausea And Vomiting   Nsaids Other (See Comments)    Upset stomach    Trulicity [Dulaglutide] Nausea And Vomiting    Significant and undesirably rapid (40 lbs) weight loss    Xanax [Alprazolam] Other (See Comments)    disoriented   Amoxicillin Rash    Has patient had a PCN reaction causing immediate rash, facial/tongue/throat swelling, SOB or lightheadedness with hypotension: Yes Has patient had a PCN reaction causing severe rash involving mucus membranes or skin necrosis: No Has patient had a PCN reaction that required hospitalization: No Has patient had a PCN reaction occurring within the last 10 years: No If all of the above answers are "NO", then may proceed with Cephalosporin use.    Pseudoephedrine Hcl Er Palpitations     Objective: Physical Exam General: The patient is alert and oriented x3 in no acute distress.  Dermatology: Skin is warm, dry and supple bilateral lower extremities.  Negative for open lesions or macerations bilateral.   Vascular: Dorsalis Pedis and Posterior Tibial pulses palpable bilateral.  Capillary fill time is immediate to all digits.  Neurological: Epicritic and protective threshold intact bilateral.   Musculoskeletal: There continues to be tenderness to palpation to the plantar aspect of the right heel along the plantar fascia. All other joints range of motion within normal limits bilateral. Strength 5/5 in all groups bilateral.   Radiographic exam B/L feet 03/14/2022: Normal osseous mineralization. Joint spaces preserved. No fracture/dislocation/boney destruction. No other soft tissue abnormalities or radiopaque  foreign bodies.  Plantar heel spurs noted on bilateral lateral views  Assessment: 1. Plantar fasciitis right -Patient evaluated -Patient declined cortisone injection today -Discussed with the patient different treatment options including physical therapy and immobilization in a cam boot.  The patient would like to try immobilization in the cam boot -Cam boot dispensed.  WBAT x 3 weeks -Recommend that she takes the meloxicam daily as prescribed -Return to clinic 3 weeks   Edrick Kins, DPM [facility]  Dr. Edrick Kins, DPM    [address], [place] [address]                Office [phone]  Fax [phone]

## 2022-04-18 ENCOUNTER — Other Ambulatory Visit (HOSPITAL_COMMUNITY): Payer: Self-pay

## 2022-04-18 ENCOUNTER — Other Ambulatory Visit (HOSPITAL_BASED_OUTPATIENT_CLINIC_OR_DEPARTMENT_OTHER): Payer: Self-pay

## 2022-04-19 ENCOUNTER — Encounter: Payer: Self-pay | Admitting: Family Medicine

## 2022-04-19 ENCOUNTER — Other Ambulatory Visit (HOSPITAL_BASED_OUTPATIENT_CLINIC_OR_DEPARTMENT_OTHER): Payer: Self-pay

## 2022-04-19 ENCOUNTER — Ambulatory Visit (INDEPENDENT_AMBULATORY_CARE_PROVIDER_SITE_OTHER): Payer: Commercial Managed Care - PPO | Admitting: Family Medicine

## 2022-04-19 ENCOUNTER — Encounter (INDEPENDENT_AMBULATORY_CARE_PROVIDER_SITE_OTHER): Payer: Self-pay | Admitting: Family Medicine

## 2022-04-19 ENCOUNTER — Other Ambulatory Visit: Payer: Self-pay | Admitting: Family Medicine

## 2022-04-19 VITALS — BP 129/75 | HR 72 | Ht 66.0 in | Wt 209.0 lb

## 2022-04-19 DIAGNOSIS — N1832 Chronic kidney disease, stage 3b: Secondary | ICD-10-CM | POA: Diagnosis not present

## 2022-04-19 DIAGNOSIS — Z6833 Body mass index (BMI) 33.0-33.9, adult: Secondary | ICD-10-CM | POA: Diagnosis not present

## 2022-04-19 DIAGNOSIS — E669 Obesity, unspecified: Secondary | ICD-10-CM | POA: Diagnosis not present

## 2022-04-19 DIAGNOSIS — Z794 Long term (current) use of insulin: Secondary | ICD-10-CM | POA: Diagnosis not present

## 2022-04-19 DIAGNOSIS — Z1231 Encounter for screening mammogram for malignant neoplasm of breast: Secondary | ICD-10-CM

## 2022-04-19 DIAGNOSIS — E1122 Type 2 diabetes mellitus with diabetic chronic kidney disease: Secondary | ICD-10-CM | POA: Diagnosis not present

## 2022-04-19 MED ORDER — WEGOVY 2.4 MG/0.75ML ~~LOC~~ SOAJ
2.4000 mg | SUBCUTANEOUS | 4 refills | Status: DC
  Filled 2022-04-19: qty 3, 28d supply, fill #0

## 2022-04-20 ENCOUNTER — Other Ambulatory Visit (HOSPITAL_BASED_OUTPATIENT_CLINIC_OR_DEPARTMENT_OTHER): Payer: Self-pay

## 2022-04-21 ENCOUNTER — Telehealth (INDEPENDENT_AMBULATORY_CARE_PROVIDER_SITE_OTHER): Payer: Self-pay | Admitting: Family Medicine

## 2022-04-21 ENCOUNTER — Other Ambulatory Visit (HOSPITAL_COMMUNITY): Payer: Self-pay

## 2022-04-21 ENCOUNTER — Other Ambulatory Visit (HOSPITAL_BASED_OUTPATIENT_CLINIC_OR_DEPARTMENT_OTHER): Payer: Self-pay

## 2022-04-21 NOTE — Telephone Encounter (Signed)
Deanna Rowe (Key: [id number])  Your information has been sent to [employer] via CoverMyMeds

## 2022-04-22 ENCOUNTER — Other Ambulatory Visit: Payer: Self-pay

## 2022-04-22 ENCOUNTER — Other Ambulatory Visit (HOSPITAL_BASED_OUTPATIENT_CLINIC_OR_DEPARTMENT_OTHER): Payer: Self-pay

## 2022-04-22 MED ORDER — ATORVASTATIN CALCIUM 10 MG PO TABS
10.0000 mg | ORAL_TABLET | Freq: Every day | ORAL | 3 refills | Status: DC
  Filled 2022-05-13: qty 90, 90d supply, fill #0
  Filled 2022-08-06: qty 90, 90d supply, fill #1
  Filled 2022-11-14 – 2022-11-21 (×4): qty 30, 30d supply, fill #2
  Filled 2022-12-15: qty 30, 30d supply, fill #3
  Filled 2023-01-16: qty 30, 30d supply, fill #4
  Filled 2023-02-13: qty 30, 30d supply, fill #5
  Filled 2023-03-13: qty 30, 30d supply, fill #6
  Filled 2023-04-10: qty 30, 30d supply, fill #7

## 2022-04-25 ENCOUNTER — Other Ambulatory Visit (HOSPITAL_BASED_OUTPATIENT_CLINIC_OR_DEPARTMENT_OTHER): Payer: Self-pay

## 2022-04-25 NOTE — Telephone Encounter (Signed)
Your prior authorization request has been denied. COMPLETE E-APPEAL  Your request for prior authorization was denied, but an Electronic Appeal is available for your patient. Complete the questions in the Appeal section at the bottom of this page to pursue the appeal. For assistance, contact our support team at [phone].  Message from plan: This request has not been approved. Based on the information sent for review, the requested drug did not meet our guideline rules. To get the request approved, your doctor needs to show that you have met the guideline rules below. If you have questions, please call your doctor. In some cases, the requested drug or alternatives offered may have other guideline rules that need to be met. Your provider requested Wegovy for weight loss or weight management. For renewal for weight loss or weight management, our guideline named ANTI-OBESITY AGENTS (reviewed for [facility]) requires that you have achieved or maintained at least 5 percent weight loss of baseline body weight. This request was denied because we did not receive information that you meet the requirement listed above. Please work with your doctor to use a different medication or get [name] more information if it will allow [name] to approve this request. A written notification letter will follow with additional details.

## 2022-04-26 ENCOUNTER — Other Ambulatory Visit (HOSPITAL_BASED_OUTPATIENT_CLINIC_OR_DEPARTMENT_OTHER): Payer: Self-pay

## 2022-04-26 MED ORDER — OZEMPIC (2 MG/DOSE) 8 MG/3ML ~~LOC~~ SOPN
2.0000 mg | PEN_INJECTOR | SUBCUTANEOUS | 11 refills | Status: DC
  Filled 2022-04-26: qty 3, 28d supply, fill #0

## 2022-04-26 NOTE — Progress Notes (Unsigned)
Chief Complaint:   OBESITY Deanna Rowe is here to discuss her progress with her obesity treatment plan along with follow-up of her obesity related diagnoses. [name] is on the Category 2 Plan and states she is following her eating plan approximately 50% of the time. [name] states she is doing physical therapy 2 times per week.    Today's visit was #: 97 Starting weight: 237 lbs Starting date: 04/16/2019 Today's weight: 209 lbs Today's date: 04/19/2022 Total lbs lost to date: 28 Total lbs lost since last in-office visit: 1  Interim History: [name] has done very well with avoiding weight gain over the holidays.  Her insurance has changed and she needs another prior authorization done.  She notes increased polyphagia off Wegovy for the last week.  Subjective:   1. Type 2 diabetes mellitus with stage 3b chronic kidney disease, with long-term current use of insulin (HCC) [name] has had some increase in glucose over the holidays after some celebration eating.  Her highest reading was 209.  She has been doing better with her diet now that the holidays are over.  Assessment/Plan:   1. Type 2 diabetes mellitus with stage 3b chronic kidney disease, with long-term current use of insulin (HCC) [name] will continue to work on her diet, exercise, and weight loss.  The importance of nutrition to control both weight and glucose were discussed.  2. Obesity, Current BMI 33.8 We will refill Wegovy and start new prior authorization (new insurance).   - Semaglutide-Weight Management (WEGOVY) 2.4 MG/0.75ML SOAJ; Inject 2.4 mg into the skin once a week.  Dispense: 9 mL; Refill: 4  [name] is currently in the action stage of change. As such, her goal is to continue with weight loss efforts. She has agreed to following a lower carbohydrate, vegetable and lean protein rich diet plan.   Eating plan details were discussed in depth.   Exercise goals: As is.   Behavioral modification strategies:  increasing lean protein intake.  [name] has agreed to follow-up with our clinic in 4 to 5 weeks. She was informed of the importance of frequent follow-up visits to maximize her success with intensive lifestyle modifications for her multiple health conditions.   Objective:   Blood pressure 129/75, pulse 72, height '5\' 6"'$  (1.676 m), weight 209 lb (94.8 kg), SpO2 95 %. Body mass index is 33.73 kg/m.  General: Cooperative, alert, well developed, in no acute distress. HEENT: Conjunctivae and lids unremarkable. Cardiovascular: Regular rhythm.  Lungs: Normal work of breathing. Neurologic: No focal deficits.   Lab Results  Component Value Date   CREATININE 1.32 (H) 04/05/2022   BUN 19 04/05/2022   NA 138 04/05/2022   K 5.5 (H) 04/05/2022   CL 100 04/05/2022   CO2 23 04/05/2022   Lab Results  Component Value Date   ALT 10 04/05/2022   AST 18 04/05/2022   ALKPHOS 54 04/05/2022   BILITOT 0.3 04/05/2022   Lab Results  Component Value Date   HGBA1C 7.9 (H) 04/16/2019   HGBA1C 6.9 11/25/2016   HGBA1C 6.0 06/10/2016   HGBA1C 5.9 02/12/2016   HGBA1C 6.1 12/01/2015   Lab Results  Component Value Date   INSULIN 12.3 04/16/2019   Lab Results  Component Value Date   TSH 1.200 04/05/2022   Lab Results  Component Value Date   CHOL 155 04/16/2019   HDL 84 04/16/2019   LDLCALC 57 04/16/2019   TRIG 71 04/16/2019   Lab Results  Component Value Date   VD25OH  44.8 12/17/2019   VD25OH 40.9 04/16/2019   Lab Results  Component Value Date   WBC 7.2 04/05/2022   HGB 12.6 04/05/2022   HCT 36.0 04/05/2022   MCV 98 (H) 04/05/2022   PLT 255 04/05/2022   No results found for: "IRON", "TIBC", "FERRITIN"  Attestation Statements:   Reviewed by clinician on day of visit: allergies, medications, problem list, medical history, surgical history, family history, social history, and previous encounter notes.  Time spent on visit including pre-visit chart review and post-visit care and  charting was 32 minutes.   I, Trixie Dredge, am acting as transcriptionist for Dennard Nip, MD.  I have reviewed the above documentation for accuracy and completeness, and I agree with the above. -  ***

## 2022-04-27 ENCOUNTER — Other Ambulatory Visit (HOSPITAL_BASED_OUTPATIENT_CLINIC_OR_DEPARTMENT_OTHER): Payer: Self-pay

## 2022-04-27 MED ORDER — WEGOVY 2.4 MG/0.75ML ~~LOC~~ SOAJ
2.4000 mg | SUBCUTANEOUS | 0 refills | Status: DC
  Filled 2022-04-27: qty 3, 28d supply, fill #0

## 2022-04-28 ENCOUNTER — Other Ambulatory Visit (HOSPITAL_BASED_OUTPATIENT_CLINIC_OR_DEPARTMENT_OTHER): Payer: Self-pay

## 2022-04-29 ENCOUNTER — Other Ambulatory Visit (HOSPITAL_BASED_OUTPATIENT_CLINIC_OR_DEPARTMENT_OTHER): Payer: Self-pay

## 2022-04-29 ENCOUNTER — Other Ambulatory Visit (HOSPITAL_COMMUNITY): Payer: Self-pay

## 2022-05-02 ENCOUNTER — Other Ambulatory Visit (HOSPITAL_BASED_OUTPATIENT_CLINIC_OR_DEPARTMENT_OTHER): Payer: Self-pay

## 2022-05-04 ENCOUNTER — Other Ambulatory Visit (HOSPITAL_BASED_OUTPATIENT_CLINIC_OR_DEPARTMENT_OTHER): Payer: Self-pay

## 2022-05-04 MED ORDER — SHINGRIX 50 MCG/0.5ML IM SUSR
INTRAMUSCULAR | 1 refills | Status: DC
  Filled 2022-05-04: qty 0.5, 1d supply, fill #0
  Filled 2022-07-11: qty 0.5, 1d supply, fill #1

## 2022-05-05 ENCOUNTER — Other Ambulatory Visit (HOSPITAL_BASED_OUTPATIENT_CLINIC_OR_DEPARTMENT_OTHER): Payer: Self-pay

## 2022-05-05 ENCOUNTER — Ambulatory Visit
Admission: RE | Admit: 2022-05-05 | Discharge: 2022-05-05 | Disposition: A | Discharge status: Home / Self Care | Payer: Commercial Managed Care - PPO | Source: Ambulatory Visit | Attending: Family Medicine | Admitting: Family Medicine

## 2022-05-05 ENCOUNTER — Other Ambulatory Visit: Payer: Self-pay

## 2022-05-05 DIAGNOSIS — Z1231 Encounter for screening mammogram for malignant neoplasm of breast: Secondary | ICD-10-CM | POA: Diagnosis not present

## 2022-05-06 ENCOUNTER — Other Ambulatory Visit (HOSPITAL_BASED_OUTPATIENT_CLINIC_OR_DEPARTMENT_OTHER): Payer: Self-pay

## 2022-05-09 ENCOUNTER — Other Ambulatory Visit (HOSPITAL_BASED_OUTPATIENT_CLINIC_OR_DEPARTMENT_OTHER): Payer: Self-pay

## 2022-05-09 MED ORDER — AMLODIPINE BESYLATE 5 MG PO TABS
5.0000 mg | ORAL_TABLET | Freq: Every day | ORAL | 0 refills | Status: DC
  Filled 2022-05-09: qty 90, 90d supply, fill #0

## 2022-05-10 ENCOUNTER — Other Ambulatory Visit (HOSPITAL_BASED_OUTPATIENT_CLINIC_OR_DEPARTMENT_OTHER): Payer: Self-pay

## 2022-05-10 ENCOUNTER — Other Ambulatory Visit (HOSPITAL_COMMUNITY): Payer: Self-pay

## 2022-05-11 ENCOUNTER — Other Ambulatory Visit (HOSPITAL_COMMUNITY): Payer: Self-pay

## 2022-05-11 ENCOUNTER — Ambulatory Visit (INDEPENDENT_AMBULATORY_CARE_PROVIDER_SITE_OTHER): Payer: Commercial Managed Care - PPO | Admitting: Podiatry

## 2022-05-11 VITALS — BP 133/82 | HR 65

## 2022-05-11 DIAGNOSIS — M722 Plantar fascial fibromatosis: Secondary | ICD-10-CM

## 2022-05-11 NOTE — Progress Notes (Signed)
Chief Complaint  Patient presents with   Plantar Fasciitis    Patient came in today for plantar fasciitis in the right foot, Diabetic A1c- 6.3 BG- 127     Subjective: 67 y.o. female PMHx diabetes mellitus with polyneuropathy, Parkinson's presenting for follow-up evaluation of plantar fasciitis to the right foot.  Patient has been WBAT in the cam boot for the past 3 weeks.  She has noticed significant improvement of the heel pain.  Overall she says that she feels much better.   Past Medical History:  Diagnosis Date   Anemia    Anxiety    Arthritis    L HIP   Asthma    Avascular necrosis of hip ([place])    LEFT   Back pain    Chest pain    Constipation    Depression    Diabetes mellitus    Diabetes mellitus, type II (HCC)    Dry cough    Edema, lower extremity    Gait abnormality 11/18/2019   GERD (gastroesophageal reflux disease)    High cholesterol    Hypertension    Insomnia    takes Ambien nightly   Joint pain    Kidney disease    Kidney disease    Obesity    Parkinsonism 03/02/2017   PONV (postoperative nausea and vomiting)    RLS (restless legs syndrome) 01/15/2018   Sleep apnea    Swallowing difficulty    Thyroid disease    TIA (transient ischemic attack) 2012   no residual problems   Urgency incontinence    Vitamin D deficiency    Past Surgical History:  Procedure Laterality Date   DILATION AND CURETTAGE OF UTERUS     ESOPHAGEAL MANOMETRY N/A 09/03/2012   Procedure: ESOPHAGEAL MANOMETRY (EM);  Surgeon: Garlan Fair, MD;  Location: [facility];  Service: Endoscopy;  Laterality: N/A;   ESOPHAGEAL MANOMETRY N/A 06/19/2017   Procedure: ESOPHAGEAL MANOMETRY (EM);  Surgeon: Clarene Essex, MD;  Location: [facility];  Service: Endoscopy;  Laterality: N/A;   EXPLORATORY LAPAROTOMY     GANGLION CYST EXCISION     JOINT REPLACEMENT  2011   rt total hip   KNEE ARTHROSCOPY     PILONIDAL CYST / SINUS EXCISION     TOTAL HIP ARTHROPLASTY Left 10/09/2013   Procedure:  LEFT TOTAL HIP ARTHROPLASTY ANTERIOR APPROACH;  Surgeon: [name], MD;  Location: [facility];  Service: Orthopedics;  Laterality: Left;   Allergies  Allergen Reactions   Fluticasone-Salmeterol Anaphylaxis   Codeine Nausea And Vomiting   Fetzima [Levomilnacipran] Nausea And Vomiting   Nsaids Other (See Comments)    Upset stomach    Trulicity [Dulaglutide] Nausea And Vomiting    Significant and undesirably rapid (40 lbs) weight loss    Xanax [Alprazolam] Other (See Comments)    disoriented   Amoxicillin Rash    Has patient had a PCN reaction causing immediate rash, facial/tongue/throat swelling, SOB or lightheadedness with hypotension: Yes Has patient had a PCN reaction causing severe rash involving mucus membranes or skin necrosis: No Has patient had a PCN reaction that required hospitalization: No Has patient had a PCN reaction occurring within the last 10 years: No If all of the above answers are "NO", then may proceed with Cephalosporin use.    Pseudoephedrine Hcl Er Palpitations     Objective: Physical Exam General: The patient is alert and oriented x3 in no acute distress.  Dermatology: Skin is warm, dry and supple bilateral lower extremities. Negative  for open lesions or macerations bilateral.   Vascular: Dorsalis Pedis and Posterior Tibial pulses palpable bilateral.  Capillary fill time is immediate to all digits.  Neurological: Epicritic and protective threshold intact bilateral.   Musculoskeletal: There continues to be mild tenderness to palpation to the plantar aspect of the right heel along the plantar fascia. All other joints range of motion within normal limits bilateral. Strength 5/5 in all groups bilateral.   Radiographic exam B/L feet 03/14/2022: Normal osseous mineralization. Joint spaces preserved. No fracture/dislocation/boney destruction. No other soft tissue abnormalities or radiopaque foreign bodies.  Plantar heel spurs noted on bilateral lateral  views  Assessment: 1. Plantar fasciitis right 2.  Encounter for diabetic foot exam -Patient evaluated.  Comprehensive diabetic foot exam performed today -Patient declined cortisone injection today - Patient has noticed overall improvement.  She may now transition out of the cam boot into good supportive tennis shoes and sneakers.  Recommended [employer] running [employer] -Continue meloxicam 15 mg daily as needed -Return to clinic as needed   Edrick Kins, DPM [facility]  Dr. Edrick Kins, DPM    [address], [place] [address]                Office [phone]  Fax [phone]

## 2022-05-12 ENCOUNTER — Other Ambulatory Visit (HOSPITAL_BASED_OUTPATIENT_CLINIC_OR_DEPARTMENT_OTHER): Payer: Self-pay

## 2022-05-12 MED ORDER — AMLODIPINE BESYLATE 5 MG PO TABS
5.0000 mg | ORAL_TABLET | Freq: Every day | ORAL | 1 refills | Status: DC
  Filled 2022-05-12 – 2022-08-02 (×2): qty 90, 90d supply, fill #0
  Filled 2022-08-06: qty 90, 90d supply, fill #1
  Filled 2022-10-31: qty 30, 30d supply, fill #1
  Filled 2022-11-21 – 2022-11-30 (×2): qty 30, 30d supply, fill #2
  Filled 2023-01-04: qty 30, 30d supply, fill #3

## 2022-05-13 ENCOUNTER — Other Ambulatory Visit (HOSPITAL_BASED_OUTPATIENT_CLINIC_OR_DEPARTMENT_OTHER): Payer: Self-pay

## 2022-05-17 ENCOUNTER — Encounter (INDEPENDENT_AMBULATORY_CARE_PROVIDER_SITE_OTHER): Payer: Self-pay | Admitting: Family Medicine

## 2022-05-17 ENCOUNTER — Ambulatory Visit (INDEPENDENT_AMBULATORY_CARE_PROVIDER_SITE_OTHER): Payer: Commercial Managed Care - PPO | Admitting: Family Medicine

## 2022-05-17 ENCOUNTER — Other Ambulatory Visit (HOSPITAL_BASED_OUTPATIENT_CLINIC_OR_DEPARTMENT_OTHER): Payer: Self-pay

## 2022-05-17 VITALS — BP 134/82 | HR 67 | Temp 97.8°F | Ht 66.0 in | Wt 212.0 lb

## 2022-05-17 DIAGNOSIS — Z6830 Body mass index (BMI) 30.0-30.9, adult: Secondary | ICD-10-CM | POA: Insufficient documentation

## 2022-05-17 DIAGNOSIS — Z6834 Body mass index (BMI) 34.0-34.9, adult: Secondary | ICD-10-CM

## 2022-05-17 DIAGNOSIS — E669 Obesity, unspecified: Secondary | ICD-10-CM | POA: Insufficient documentation

## 2022-05-17 DIAGNOSIS — E1169 Type 2 diabetes mellitus with other specified complication: Secondary | ICD-10-CM

## 2022-05-17 DIAGNOSIS — Z7985 Long-term (current) use of injectable non-insulin antidiabetic drugs: Secondary | ICD-10-CM

## 2022-05-17 DIAGNOSIS — Z794 Long term (current) use of insulin: Secondary | ICD-10-CM | POA: Diagnosis not present

## 2022-05-17 DIAGNOSIS — Z6833 Body mass index (BMI) 33.0-33.9, adult: Secondary | ICD-10-CM | POA: Insufficient documentation

## 2022-05-17 DIAGNOSIS — Z6831 Body mass index (BMI) 31.0-31.9, adult: Secondary | ICD-10-CM | POA: Insufficient documentation

## 2022-05-17 DIAGNOSIS — E119 Type 2 diabetes mellitus without complications: Secondary | ICD-10-CM | POA: Insufficient documentation

## 2022-05-17 DIAGNOSIS — I10 Essential (primary) hypertension: Secondary | ICD-10-CM | POA: Diagnosis not present

## 2022-05-17 MED ORDER — OZEMPIC (2 MG/DOSE) 8 MG/3ML ~~LOC~~ SOPN
2.0000 mg | PEN_INJECTOR | SUBCUTANEOUS | 0 refills | Status: DC
  Filled 2022-05-17 – 2022-05-19 (×2): qty 9, 84d supply, fill #0

## 2022-05-17 NOTE — Progress Notes (Unsigned)
Cardiology Office Note   Date:  05/18/2022   ID:  Deanna Rowe, Deanna Rowe 1956/04/04, MRN [id number]  PCP:  Aretta Nip, MD    No chief complaint on file.    Wt Readings from Last 3 Encounters:  05/18/22 213 lb 9.6 oz (96.9 kg)  05/17/22 212 lb (96.2 kg)  04/19/22 209 lb (94.8 kg)       History of Present Illness: Deanna Rowe is a 67 y.o. female   With h/o palpitations.  PVCs noted on prior monitors.    Prior cardiac testing in 2017 as follows: "Her ETT was negative for ischemia. No arrythmias noted during stress. Heart monitor was also fairly normal showing mostly NSR with occasinal PVCs and PACs. "  Echo in 2020, normal LV function.     In 2021: "She has reported DOE over the past several months.  THere is some chest tightness, worse with walking.    Walking short distances causes sx.  Has issues with balance, related to Parkinson's.  She has had three falls.  ER visit showed that no fracture."     2021 calcium scoring CT showed: "No evidence of CAD, CADRADS = 0.   2. Coronary calcium score of 0. This was 0 percentile for age and sex matched control.   3. Normal coronary origin with right dominance."  She is in the Parkinson's exercise group.  No chest pain or SHOB with activity.    Denies : exertional Chest pain. Dizziness. Nitroglycerin use. Orthopnea. Palpitations. Paroxysmal nocturnal dyspnea. Shortness of breath. Syncope.    Occasional leg edema, worse at the end of the day.   Past Medical History:  Diagnosis Date   Anemia    Anxiety    Arthritis    L HIP   Asthma    Avascular necrosis of hip ([place])    LEFT   Back pain    Chest pain    Constipation    Depression    Diabetes mellitus    Diabetes mellitus, type II (HCC)    Dry cough    Edema, lower extremity    Gait abnormality 11/18/2019   GERD (gastroesophageal reflux disease)    High cholesterol    Hypertension    Insomnia    takes Ambien nightly   Joint pain    Kidney disease     Kidney disease    Obesity    Parkinsonism 03/02/2017   PONV (postoperative nausea and vomiting)    RLS (restless legs syndrome) 01/15/2018   Sleep apnea    Swallowing difficulty    Thyroid disease    TIA (transient ischemic attack) 2012   no residual problems   Urgency incontinence    Vitamin D deficiency     Past Surgical History:  Procedure Laterality Date   DILATION AND CURETTAGE OF UTERUS     ESOPHAGEAL MANOMETRY N/A 09/03/2012   Procedure: ESOPHAGEAL MANOMETRY (EM);  Surgeon: Garlan Fair, MD;  Location: [facility];  Service: Endoscopy;  Laterality: N/A;   ESOPHAGEAL MANOMETRY N/A 06/19/2017   Procedure: ESOPHAGEAL MANOMETRY (EM);  Surgeon: Clarene Essex, MD;  Location: [facility];  Service: Endoscopy;  Laterality: N/A;   EXPLORATORY LAPAROTOMY     GANGLION CYST EXCISION     JOINT REPLACEMENT  2011   rt total hip   KNEE ARTHROSCOPY     PILONIDAL CYST / SINUS EXCISION     TOTAL HIP ARTHROPLASTY Left 10/09/2013   Procedure: LEFT TOTAL HIP ARTHROPLASTY ANTERIOR APPROACH;  Surgeon: [name], MD;  Location: [facility];  Service: Orthopedics;  Laterality: Left;     Current Outpatient Medications  Medication Sig Dispense Refill   ACCU-CHEK FASTCLIX LANCETS MISC USE TO CHECK BLOOD SUGAR 3 TIMES A DAY 102 each 0   ACCU-CHEK GUIDE test strip USE TO CHECK BLOOD SUGAR 3 TIMES PER DAY. (Patient taking differently: Use to check blood sugar 4 times per day.) 100 each 2   albuterol (VENTOLIN HFA) 108 (90 Base) MCG/ACT inhaler Inhale 2 puffs into the lungs as needed for wheezing or shortness of breath. 18 g 2   amLODipine (NORVASC) 5 MG tablet Take 1 tablet (5 mg total) by mouth daily. 90 tablet 1   aspirin EC 81 MG tablet Take 81 mg by mouth every evening.     atorvastatin (LIPITOR) 10 MG tablet TAKE 1 TABLET BY MOUTH AT BEDTIME 90 tablet 3   azelastine (ASTELIN) 0.1 % nasal spray Place 2 sprays into both nostrils 2 (two) times daily as needed. 30 mL 5   brexpiprazole (REXULTI) 2  MG TABS tablet Take 1 tablet (2 mg total) by mouth in the morning. 90 tablet 1   brexpiprazole (REXULTI) 2 MG TABS tablet Take 1 tablet (2 mg total) by mouth in the morning. 90 tablet 1   buPROPion (WELLBUTRIN XL) 150 MG 24 hr tablet Take 3 tablets (450 mg total) by mouth in the morning. 270 tablet 1   Calcium Carbonate-Vitamin D 600-400 MG-UNIT tablet Take 1 tablet by mouth daily.     carbidopa-levodopa (SINEMET CR) 50-200 MG tablet TAKE 1 TABLET BY MOUTH 5 (FIVE) TIMES DAILY. 450 tablet 0   carbidopa-levodopa (SINEMET) 25-250 MG tablet Take 1 tablet by mouth in the morning and 1 tablet at 2pm 180 tablet 1   Continuous Blood Gluc Receiver (DEXCOM G7 RECEIVER) DEVI Use as directed to check blood sugar 1 each 1   Continuous Blood Gluc Sensor (DEXCOM G7 SENSOR) MISC Use to check blood sugar and change sensor every 10 days 9 each 4   COVID-19 mRNA vaccine 2023-2024 (COMIRNATY) syringe Inject into the muscle. 0.3 mL 0   cyclobenzaprine (FLEXERIL) 5 MG tablet Take 1 tablet (5 mg total) by mouth at bedtime. 10 tablet 0   EPINEPHrine (EPIPEN 2-PAK) 0.3 mg/0.3 mL IJ SOAJ injection Inject 0.3 mg into the muscle as needed for anaphylaxis. 2 each 1   gabapentin (NEURONTIN) 300 MG capsule Take 1 capsule (300 mg total) by mouth 3 (three) times daily. 270 capsule 2   glucose blood test strip Use to check blood sugar 4 times daily 400 each 3   HYDROcodone-acetaminophen (NORCO/VICODIN) 5-325 MG tablet Take 1 tablet by mouth every 6 (six) hours as needed for 10 days. 40 tablet 0   influenza vaccine adjuvanted (FLUAD QUADRIVALENT) 0.5 ML injection Inject into the muscle. 0.5 mL 0   influenza vaccine adjuvanted (FLUAD) 0.5 ML injection Inject into the muscle. 0.5 mL 0   insulin degludec (TRESIBA FLEXTOUCH) 100 UNIT/ML FlexTouch Pen INJECT UNDER THE SKIN ONCE DAILY. TRITRATE AS DIRECTED WITH A MAXIMUM DAILY DOSE OF 50 UNITS     insulin lispro (HUMALOG) 100 UNIT/ML injection SQ - 14 units at breakfast, 12 units at  lunch, 16 units at supper 10 mL 0   Insulin Pen Needle (TECHLITE PEN NEEDLES) 32G X 6 MM MISC use to Inject insulin into the skin 4 (four) times daily. 400 each 0   LORazepam (ATIVAN) 2 MG tablet Take 1 tablet (2 mg total) by  mouth in the morning, at noon, and at bedtime. 270 tablet 1   losartan (COZAAR) 100 MG tablet Take 1 tablet (100 mg total) by mouth daily. 90 tablet 3   meloxicam (MOBIC) 15 MG tablet Take 1 tablet (15 mg total) by mouth daily. 30 tablet 1   mirabegron ER (MYRBETRIQ) 50 MG TB24 tablet Take 1 tablet by mouth everyday 90 tablet 3   Olopatadine HCl (PATADAY) 0.2 % SOLN Place 1 drop into both eyes daily as needed. 2.5 mL 5   omalizumab (XOLAIR) 150 MG/ML prefilled syringe Inject 300 mg into the skin every 28 days. 2 mL 11   omeprazole (PRILOSEC) 20 MG capsule Take 1 capsule (20 mg total) by mouth daily 30 minutes prior to dinner 30 capsule 0   propranolol (INDERAL) 40 MG tablet TAKE 1 TABLET BY MOUTH TWICE DAILY 60 tablet 11   rOPINIRole (REQUIP) 2 MG tablet Take 1 tablet (2 mg total) by mouth in the morning and at bedtime. 90 tablet 1   Semaglutide, 2 MG/DOSE, (OZEMPIC, 2 MG/DOSE,) 8 MG/3ML SOPN Inject 2 mg into the skin once a week. 9 mL 0   solifenacin (VESICARE) 10 MG tablet TAKE 1 TABLET BY MOUTH ONCE DAILY 30 tablet 11   triamcinolone (NASACORT) 55 MCG/ACT AERO nasal inhaler Place 2 sprays into the nose daily. 16.9 mL 5   valACYclovir (VALTREX) 500 MG tablet Take 1 tablet by mouth everyday 90 tablet 4   vitamin C (ASCORBIC ACID) 500 MG tablet Take 500 mg by mouth daily.     Vitamin D, Ergocalciferol, 2000 units CAPS Take 1 capsule by mouth daily. 2000 units daily.     vortioxetine HBr (TRINTELLIX) 20 MG TABS tablet Take 1 tablet (20 mg total) by mouth in the morning. 90 tablet 1   zolpidem (AMBIEN) 5 MG tablet Take 1 tablet (5 mg total) by mouth at bedtime. 90 tablet 1   Zoster Vaccine Adjuvanted [facility]) injection Inject into the muscle. 0.5 mL 1   levocetirizine  (XYZAL) 5 MG tablet Take 1 tablet (5 mg total) by mouth daily as needed. 30 tablet 5   No current facility-administered medications for this visit.   Facility-Administered Medications Ordered in Other Visits  Medication Dose Route Frequency Provider Last Rate Last Admin   tranexamic acid (CYKLOKAPRON) topical -INTRAOP  2,000 mg Topical Once [place], [employer], PA-C        Allergies:   Fluticasone-salmeterol, Codeine, Fetzima [levomilnacipran], Nsaids, Trulicity [dulaglutide], Xanax [alprazolam], Amoxicillin, and Pseudoephedrine hcl er    Social History:  The patient  reports that she has never smoked. She has never used smokeless tobacco. She reports that she does not drink alcohol and does not use drugs.   Family History:  The patient's family history includes Alcohol abuse in her brother, brother, brother, and brother; Alcoholism in her father; Allergic rhinitis in her sister; Alzheimer's disease in her maternal aunt; Heart disease in her father and mother; High blood pressure in her mother; Hyperlipidemia in her mother.    ROS:  Please see the history of present illness.   Otherwise, review of systems are positive for leg weakness from Parkinsons, poor balance.   All other systems are reviewed and negative.    PHYSICAL EXAM: VS:  BP 134/76   Pulse 71   Ht 5' 6"$  (1.676 m)   Wt 213 lb 9.6 oz (96.9 kg)   SpO2 96%   BMI 34.48 kg/m  , BMI Body mass index is 34.48 kg/m. GEN: Well nourished,  well developed, in no acute distress HEENT: normal Neck: no JVD, carotid bruits, or masses Cardiac: RRR; no murmurs, rubs, or gallops,no edema  Respiratory:  clear to auscultation bilaterally, normal work of breathing GI: soft, nontender, nondistended, + BS MS: no deformity or atrophy Skin: warm and dry, no rash Neuro:  resting lip tremor Psych: euthymic mood, full affect   EKG:   The ekg ordered today demonstrates normal sinus rhythm, LVH   Recent Labs: 04/05/2022: ALT 10; BUN 19;  Creatinine, Ser 1.32; Hemoglobin 12.6; Platelets 255; Potassium 5.5; Sodium 138; TSH 1.200   Lipid Panel    Component Value Date/Time   CHOL 155 04/16/2019 1451   TRIG 71 04/16/2019 1451   HDL 84 04/16/2019 1451   LDLCALC 57 04/16/2019 1451     Other studies Reviewed: Additional studies/ records that were reviewed today with results demonstrating: November 2023 total cholesterol 148 HDL 73 LDL 61 triglycerides 70 A1c 6.3.  January 2024 creatinine 1.24, potassium 5.5.   ASSESSMENT AND PLAN:  PACs/PVCs: Rare.  Sx controlled.  No additional medications needed. Chest tightness: Noted in the past.  Negative coronary CT in 2021.  No exertional symptoms.  Calcium score of 0 in 2021.  Lipids and A1c well-controlled. Parkinson's: We have spoken about avoiding falls. HTN: high today, but repeat check improved.  Prior BP readings have been well controlled.  The current medical regimen is effective;  continue present plan and medications.  Monitor for hyperkalemia given borderline potassium.  Would avoid any other medicines that may increase potassium.   Current medicines are reviewed at length with the patient today.  The patient concerns regarding her medicines were addressed.  The following changes have been made:  No change  Labs/ tests ordered today include:  No orders of the defined types were placed in this encounter.   Recommend 150 minutes/week of aerobic exercise Low fat, low carb, high fiber diet recommended  Disposition:   FU in 1 year   Signed, Larae Grooms, MD  05/18/2022 9:45 AM    [address], [place], [place]  [address] Phone: [phone]; Fax: [phone]

## 2022-05-18 ENCOUNTER — Other Ambulatory Visit (HOSPITAL_BASED_OUTPATIENT_CLINIC_OR_DEPARTMENT_OTHER): Payer: Self-pay

## 2022-05-18 ENCOUNTER — Ambulatory Visit: Payer: Commercial Managed Care - PPO | Attending: Interventional Cardiology | Admitting: Interventional Cardiology

## 2022-05-18 VITALS — BP 134/76 | HR 71 | Ht 66.0 in | Wt 213.6 lb

## 2022-05-18 DIAGNOSIS — E782 Mixed hyperlipidemia: Secondary | ICD-10-CM

## 2022-05-18 DIAGNOSIS — I493 Ventricular premature depolarization: Secondary | ICD-10-CM | POA: Diagnosis not present

## 2022-05-18 DIAGNOSIS — I5032 Chronic diastolic (congestive) heart failure: Secondary | ICD-10-CM

## 2022-05-18 DIAGNOSIS — I10 Essential (primary) hypertension: Secondary | ICD-10-CM | POA: Diagnosis not present

## 2022-05-18 NOTE — Patient Instructions (Signed)
Medication Instructions:  Your physician recommends that you continue on your current medications as directed. Please refer to the Current Medication list given to you today.  *If you need a refill on your cardiac medications before your next appointment, please call your pharmacy*   Lab Work: none If you have labs (blood work) drawn today and your tests are completely normal, you will receive your results only by: [place] (if you have MyChart) OR A paper copy in the mail If you have any lab test that is abnormal or we need to change your treatment, we will call you to review the results.   Testing/Procedures: none   Follow-Up: At [facility], you and your health needs are our priority.  As part of our continuing mission to provide you with exceptional heart care, we have created designated Provider Care Teams.  These Care Teams include your primary Cardiologist (physician) and Advanced Practice Providers (APPs -  Physician Assistants and Nurse Practitioners) who all work together to provide you with the care you need, when you need it.  We recommend signing up for the patient portal called "MyChart".  Sign up information is provided on this After Visit Summary.  MyChart is used to connect with patients for Virtual Visits (Telemedicine).  Patients are able to view lab/test results, encounter notes, upcoming appointments, etc.  Non-urgent messages can be sent to your provider as well.   To learn more about what you can do with MyChart, go to [email or web address].    Your next appointment:   12 month(s)  Provider:   Larae Grooms, MD     Other Instructions  Try to limit intake of foods high in potassium.  These are some examples         Salt substitutes that contain potassium. Fruits like bananas, apricots, nectarines, melon, prunes, raisins, kiwi, and oranges. Vegetables, such as potatoes, sweet potatoes, yams, tomatoes, leafy greens, beets, avocado,  pumpkin, and winter squash. Beans, like lima beans. Nuts.

## 2022-05-19 ENCOUNTER — Other Ambulatory Visit: Payer: Self-pay | Admitting: Family

## 2022-05-19 ENCOUNTER — Other Ambulatory Visit: Payer: Self-pay

## 2022-05-19 ENCOUNTER — Other Ambulatory Visit (HOSPITAL_BASED_OUTPATIENT_CLINIC_OR_DEPARTMENT_OTHER): Payer: Self-pay

## 2022-05-19 MED ORDER — OMEPRAZOLE 20 MG PO CPDR
20.0000 mg | DELAYED_RELEASE_CAPSULE | Freq: Every day | ORAL | 1 refills | Status: DC
  Filled 2022-05-19: qty 30, 30d supply, fill #0

## 2022-05-20 ENCOUNTER — Other Ambulatory Visit (HOSPITAL_BASED_OUTPATIENT_CLINIC_OR_DEPARTMENT_OTHER): Payer: Self-pay

## 2022-05-23 ENCOUNTER — Other Ambulatory Visit: Payer: Self-pay

## 2022-05-23 ENCOUNTER — Other Ambulatory Visit (HOSPITAL_BASED_OUTPATIENT_CLINIC_OR_DEPARTMENT_OTHER): Payer: Self-pay

## 2022-05-27 ENCOUNTER — Other Ambulatory Visit (HOSPITAL_BASED_OUTPATIENT_CLINIC_OR_DEPARTMENT_OTHER): Payer: Self-pay

## 2022-05-27 DIAGNOSIS — R208 Other disturbances of skin sensation: Secondary | ICD-10-CM | POA: Diagnosis not present

## 2022-05-27 DIAGNOSIS — N1831 Chronic kidney disease, stage 3a: Secondary | ICD-10-CM | POA: Diagnosis not present

## 2022-05-27 DIAGNOSIS — E113299 Type 2 diabetes mellitus with mild nonproliferative diabetic retinopathy without macular edema, unspecified eye: Secondary | ICD-10-CM | POA: Diagnosis not present

## 2022-05-27 DIAGNOSIS — I700 Atherosclerosis of aorta: Secondary | ICD-10-CM | POA: Diagnosis not present

## 2022-05-27 DIAGNOSIS — E1151 Type 2 diabetes mellitus with diabetic peripheral angiopathy without gangrene: Secondary | ICD-10-CM | POA: Diagnosis not present

## 2022-05-27 DIAGNOSIS — E042 Nontoxic multinodular goiter: Secondary | ICD-10-CM | POA: Diagnosis not present

## 2022-05-27 MED ORDER — DAPAGLIFLOZIN PROPANEDIOL 5 MG PO TABS
5.0000 mg | ORAL_TABLET | Freq: Every day | ORAL | 4 refills | Status: DC
  Filled 2022-05-27: qty 90, 90d supply, fill #0
  Filled 2022-09-05: qty 30, 30d supply, fill #1
  Filled 2022-10-05: qty 30, 30d supply, fill #2
  Filled 2022-11-04: qty 30, 30d supply, fill #3
  Filled 2022-11-21 – 2022-12-05 (×2): qty 30, 30d supply, fill #4
  Filled 2023-01-04: qty 30, 30d supply, fill #5
  Filled 2023-02-03: qty 30, 30d supply, fill #6
  Filled 2023-03-06: qty 30, 30d supply, fill #7
  Filled 2023-03-31: qty 30, 30d supply, fill #8
  Filled 2023-05-01: qty 30, 30d supply, fill #9

## 2022-05-30 ENCOUNTER — Other Ambulatory Visit: Payer: Self-pay | Admitting: Interventional Cardiology

## 2022-05-30 ENCOUNTER — Other Ambulatory Visit (HOSPITAL_BASED_OUTPATIENT_CLINIC_OR_DEPARTMENT_OTHER): Payer: Self-pay

## 2022-05-30 MED ORDER — SOLIFENACIN SUCCINATE 10 MG PO TABS
10.0000 mg | ORAL_TABLET | Freq: Every day | ORAL | 11 refills | Status: DC
  Filled 2022-05-31: qty 30, 30d supply, fill #0
  Filled 2022-07-04: qty 30, 30d supply, fill #1
  Filled 2022-08-06: qty 30, 30d supply, fill #2
  Filled 2022-09-06: qty 30, 30d supply, fill #3
  Filled 2022-10-14 (×2): qty 30, 30d supply, fill #4
  Filled 2022-11-16 – 2022-11-21 (×4): qty 30, 30d supply, fill #5
  Filled 2022-12-23: qty 30, 30d supply, fill #6
  Filled 2023-01-22: qty 30, 30d supply, fill #7
  Filled 2023-02-28: qty 30, 30d supply, fill #8

## 2022-05-30 MED ORDER — PROPRANOLOL HCL 40 MG PO TABS
40.0000 mg | ORAL_TABLET | Freq: Two times a day (BID) | ORAL | 11 refills | Status: DC
  Filled 2022-05-30: qty 60, 30d supply, fill #0
  Filled 2022-07-04: qty 60, 30d supply, fill #1
  Filled 2022-08-06: qty 60, 30d supply, fill #2
  Filled 2022-08-11 – 2022-09-06 (×2): qty 60, 30d supply, fill #3
  Filled 2022-10-14: qty 60, 30d supply, fill #4
  Filled 2022-11-16 – 2022-11-21 (×4): qty 60, 30d supply, fill #5
  Filled 2022-12-30: qty 60, 30d supply, fill #6
  Filled 2023-01-23: qty 60, 30d supply, fill #7
  Filled 2023-03-13: qty 60, 30d supply, fill #8

## 2022-05-31 ENCOUNTER — Other Ambulatory Visit (HOSPITAL_COMMUNITY): Payer: Self-pay

## 2022-05-31 ENCOUNTER — Other Ambulatory Visit (HOSPITAL_BASED_OUTPATIENT_CLINIC_OR_DEPARTMENT_OTHER): Payer: Self-pay

## 2022-05-31 NOTE — Progress Notes (Unsigned)
Chief Complaint:   OBESITY Deanna Rowe is here to discuss her progress with her obesity treatment plan along with follow-up of her obesity related diagnoses. Deanna Rowe is on following a lower carbohydrate, vegetable and lean protein rich diet plan and states she is following her eating plan approximately 50% of the time. Deanna Rowe states she is exercise as a group 2 times per week.    Today's visit was #: 50 Starting weight: 237 lbs Starting date: 04/16/2019 Today's weight: 212 lbs Today's date: 05/17/2022 Total lbs lost to date: 25 Total lbs lost since last in-office visit: 0  Interim History: Deanna Rowe has been off her Mancel Parsons due to lack of insurance covers. She is now in a Parkinson's group exercises and she feels that she has been helpful for her mobility as well as heart health.   Subjective:   1. Type 2 diabetes mellitus with other specified complication, with long-term current use of insulin (HCC) Deanna Rowe is to continue to have wide glucose excursions, ranging from 48-290. She had been on Wegovy, but her insurance change her to [facility] which she feels she hasn't been as helpful for her.   2. Primary hypertension Deanna Rowe's blood pressure is stable on her medications. She denies hypotension or recent fall. She denies headaches.   Assessment/Plan:   1. Type 2 diabetes mellitus with other specified complication, with long-term current use of insulin (HCC) We will refill Ozempic 2 mg once weekly for 90 days. Deanna Rowe was educated on the dangers of hypoglycemia and she was encouraged to have small protein rish snacks throughout the day.   - Semaglutide, 2 MG/DOSE, (OZEMPIC, 2 MG/DOSE,) 8 MG/3ML SOPN; Inject 2 mg into the skin once a week.  Dispense: 9 mL; Refill: 0  2. Primary hypertension Marcelle will continue with her weight loss and exercise. We discussed "hidden sodium" in food and how to avoid this.   3. BMI 34.0-34.9,adult  4. Obesity, Beginning BMI 39.44 Deanna Rowe is currently in  the action stage of change. As such, her goal is to continue with weight loss efforts. She has agreed to the Category 2 Plan.   Exercise goals: As is.   Behavioral modification strategies: increasing lean protein intake.  Deanna Rowe has agreed to follow-up with our clinic in 4 weeks. She was informed of the importance of frequent follow-up visits to maximize her success with intensive lifestyle modifications for her multiple health conditions.   Objective:   Blood pressure 134/82, pulse 67, temperature 97.8 F (36.6 C), height '5\' 6"'$  (1.676 m), weight 212 lb (96.2 kg), SpO2 97 %. Body mass index is 34.22 kg/m.  General: Cooperative, alert, well developed, in no acute distress. HEENT: Conjunctivae and lids unremarkable. Cardiovascular: Regular rhythm.  Lungs: Normal work of breathing. Neurologic: No focal deficits.   Lab Results  Component Value Date   CREATININE 1.32 (H) 04/05/2022   BUN 19 04/05/2022   NA 138 04/05/2022   K 5.5 (H) 04/05/2022   CL 100 04/05/2022   CO2 23 04/05/2022   Lab Results  Component Value Date   ALT 10 04/05/2022   AST 18 04/05/2022   ALKPHOS 54 04/05/2022   BILITOT 0.3 04/05/2022   Lab Results  Component Value Date   HGBA1C 7.9 (H) 04/16/2019   HGBA1C 6.9 11/25/2016   HGBA1C 6.0 06/10/2016   HGBA1C 5.9 02/12/2016   HGBA1C 6.1 12/01/2015   Lab Results  Component Value Date   INSULIN 12.3 04/16/2019   Lab Results  Component Value Date  TSH 1.200 04/05/2022   Lab Results  Component Value Date   CHOL 155 04/16/2019   HDL 84 04/16/2019   LDLCALC 57 04/16/2019   TRIG 71 04/16/2019   Lab Results  Component Value Date   VD25OH 44.8 12/17/2019   VD25OH 40.9 04/16/2019   Lab Results  Component Value Date   WBC 7.2 04/05/2022   HGB 12.6 04/05/2022   HCT 36.0 04/05/2022   MCV 98 (H) 04/05/2022   PLT 255 04/05/2022   No results found for: "IRON", "TIBC", "FERRITIN"  Attestation Statements:   Reviewed by clinician on day of visit:  allergies, medications, problem list, medical history, surgical history, family history, social history, and previous encounter notes.   I, Trixie Dredge, am acting as transcriptionist for Dennard Nip, MD.  I have reviewed the above documentation for accuracy and completeness, and I agree with the above. -  Dennard Nip, MD

## 2022-06-01 ENCOUNTER — Other Ambulatory Visit (HOSPITAL_BASED_OUTPATIENT_CLINIC_OR_DEPARTMENT_OTHER): Payer: Self-pay

## 2022-06-01 ENCOUNTER — Other Ambulatory Visit (HOSPITAL_COMMUNITY): Payer: Self-pay

## 2022-06-02 ENCOUNTER — Ambulatory Visit: Payer: 59 | Admitting: Neurology

## 2022-06-06 ENCOUNTER — Other Ambulatory Visit (HOSPITAL_BASED_OUTPATIENT_CLINIC_OR_DEPARTMENT_OTHER): Payer: Self-pay

## 2022-06-09 ENCOUNTER — Ambulatory Visit: Payer: Medicare Other | Admitting: Interventional Cardiology

## 2022-06-13 ENCOUNTER — Other Ambulatory Visit (HOSPITAL_BASED_OUTPATIENT_CLINIC_OR_DEPARTMENT_OTHER): Payer: Self-pay

## 2022-06-14 ENCOUNTER — Other Ambulatory Visit (HOSPITAL_BASED_OUTPATIENT_CLINIC_OR_DEPARTMENT_OTHER): Payer: Self-pay

## 2022-06-20 ENCOUNTER — Other Ambulatory Visit (HOSPITAL_BASED_OUTPATIENT_CLINIC_OR_DEPARTMENT_OTHER): Payer: Self-pay

## 2022-06-20 ENCOUNTER — Encounter: Payer: Self-pay | Admitting: Internal Medicine

## 2022-06-20 ENCOUNTER — Ambulatory Visit: Payer: Commercial Managed Care - PPO | Admitting: Internal Medicine

## 2022-06-20 VITALS — BP 120/80 | HR 74 | Temp 97.3°F | Resp 16

## 2022-06-20 DIAGNOSIS — K219 Gastro-esophageal reflux disease without esophagitis: Secondary | ICD-10-CM

## 2022-06-20 DIAGNOSIS — J4540 Moderate persistent asthma, uncomplicated: Secondary | ICD-10-CM

## 2022-06-20 DIAGNOSIS — J3089 Other allergic rhinitis: Secondary | ICD-10-CM | POA: Diagnosis not present

## 2022-06-20 DIAGNOSIS — H1010 Acute atopic conjunctivitis, unspecified eye: Secondary | ICD-10-CM

## 2022-06-20 DIAGNOSIS — H1013 Acute atopic conjunctivitis, bilateral: Secondary | ICD-10-CM

## 2022-06-20 DIAGNOSIS — K220 Achalasia of cardia: Secondary | ICD-10-CM

## 2022-06-20 NOTE — Progress Notes (Signed)
Follow Up Note  RE: Deanna Rowe MRN: [id number] DOB: 13-Jun-1955 Date of Office Visit: 06/20/2022  Referring provider: Aretta Nip, MD Primary care provider: Aretta Nip, MD  Chief Complaint: Asthma  History of Present Illness: I had the pleasure of seeing Deanna Rowe for a follow up visit at the [facility] on 06/20/2022. She is a 67 y.o. female, who is being followed for persistent asthma, allergic rhinitis, reflux allergic conjunctivitis . Her previous allergy office visit was on 03/21/22 with Dr. Edison Pace. Today is a regular follow up visit.  History obtained from patient, chart review.  At last visit famotidine was discontinued and she was started on omeprazole for GERD.  Today they report that overall all of her allergy symptoms and asthma symptoms are much improved   She continues with Symbicort 160 mcg 2 puffs twice daily (getting 16/28 per week.).  needing albuterol 1 time per week.  .She is washing her mouth out after use.  She is on Xolair 300 mg injections every 4 weeks.    In regards to rhinitis symptoms she is well-controlled on Nasacort 2 sprays in each nostril once a day, Xyzal 5 mg once a day, azelastine nasal spray 2 sprays per nostril twice a day. She is taking all of these as needed a few times per week.   Reflux is still persistent,  Not taking omeprazole due to having too many medications and difficulty keeping up.   She has achalasia She follows with GI for this, but has not been seen in them in over a year   Assessment and Plan: Deanna Rowe is a 67 y.o. female with: Moderate persistent asthma without complication  Perennial allergic rhinitis  Seasonal allergic conjunctivitis  Gastroesophageal reflux disease without esophagitis  Achalasia Plan: Patient Instructions  Asthma: well controlled  We can consider stepping down symbicort at follow up if you remain well controlled  Continue Symbicort 160/4.5 mcg to-2 puffs  twice a day with a spacer to prevent cough or wheeze.   Continue albuterol 2 puffs every 4 hours as needed for cough, wheeze, tightness in chest, or shortness of breath Continue Xolair injections 300 mg once every 4 weeks and have access to an epinephrine auto-injector set.    Allergic rhinitis: well controlled  Continue Nasacort 2 sprays in each nostril once a day as needed for stuffy nose Continue saline nasal rinses as needed for nasal symptoms. Use this before any medicated nasal sprays for best result Continue azelastine nasal spray 2 sprays in each nostril twice a day as needed for drainage down throat/runny nose May use Xyzal 5 mg once a day only as needed for a runny nose or itch Continue allergen avoidance measures directed toward dust mites, cat, and dog as listed below  Allergic conjunctivitis Some over the counter eye drops include Pataday one drop in each eye once a day as needed for red, itchy eyes OR Zaditor one drop in each eye twice a day as needed for red itchy eyes  Reflux: not well controlled  Continue dietary and lifestyle modifications  Follow up with GI for achalasia and refractory reflux symptoms   Please let us know if this treatment plan is not working well for you  Follow up: 6 month  Thank you so much for letting me partake in your care today.  Don't hesitate to reach out if you have any additional concerns!  Roney [name], MD  [facility] [place]  Gastroesophageal Reflux Induced Respiratory Disease and Laryngopharyngeal Reflux (LPR): Gastroesophageal reflux disease (GERD) is a condition where the contents of the stomach reflux or back up into the esophagus or swallowing tube.  This can result in a variety of clinical symptoms including classic symptoms and atypical symptoms.  Classic symptoms of GERD include: heartburn, chest pain, acid taste in the mouth, and difficulty in swallowing.  Atypical symptoms of GERD include  laryngopharyngeal reflux (LPR) and asthma.  LPR occurs when stomach reflux comes all the way up to the throat.  Clinical symptoms include hoarseness, raspy voice, laryngitis, throat clearing, postnasal drip, mucus stuck in the throat, a sensation of a lump in the throat, sore throat, and cough.  Most patients with LPR do not have classic symptoms of GERD.  Asthma can also be triggered by GERD.  The acid stomach fluid can stimulate nerve fibers in the esophagus which can cause an increase in bronchial muscle tone and narrowing of the airways.  Acid stomach contents may also reflux into the trachea and bronchi of the lungs where it can trigger an asthma attack.  Many people with GERD triggered asthma do not have classic symptoms of GERD.  Diagnosis of LPR and GERD induced asthma is frequently made from a typical history and response to medications.  It may take several months of medications to see a good response.  Occasionally, a 24-hour esophageal pH probe study must be performed.  Treatment of GERD/LPR includes:   Modification of diet and lifestyle Stop smoking Avoid overeating and lose weight Avoid acidic and fatty foods, chocolate, onions, garlic, peppermint Elevate the head of your bed 6 to 8 inches with blocks or wedge Medications Zantac, Pepcid, Axid, Tagamet Prilosec, Prevacid, Aciphex, Protonix, Nexium Surgery       No follow-ups on file.  No orders of the defined types were placed in this encounter.   Lab Orders  No laboratory test(s) ordered today   Diagnostics: None done    Medication List:  Current Outpatient Medications  Medication Sig Dispense Refill   ACCU-CHEK FASTCLIX LANCETS MISC USE TO CHECK BLOOD SUGAR 3 TIMES A DAY 102 each 0   ACCU-CHEK GUIDE test strip USE TO CHECK BLOOD SUGAR 3 TIMES PER DAY. (Patient taking differently: Use to check blood sugar 4 times per day.) 100 each 2   albuterol (VENTOLIN HFA) 108 (90 Base) MCG/ACT inhaler Inhale 2 puffs into  the lungs as needed for wheezing or shortness of breath. 18 g 2   amLODipine (NORVASC) 5 MG tablet Take 1 tablet (5 mg total) by mouth daily. 90 tablet 1   aspirin EC 81 MG tablet Take 81 mg by mouth every evening.     atorvastatin (LIPITOR) 10 MG tablet TAKE 1 TABLET BY MOUTH AT BEDTIME 90 tablet 3   azelastine (ASTELIN) 0.1 % nasal spray Place 2 sprays into both nostrils 2 (two) times daily as needed. 30 mL 5   brexpiprazole (REXULTI) 2 MG TABS tablet Take 1 tablet (2 mg total) by mouth in the morning. 90 tablet 1   brexpiprazole (REXULTI) 2 MG TABS tablet Take 1 tablet (2 mg total) by mouth in the morning. 90 tablet 1   buPROPion (WELLBUTRIN XL) 150 MG 24 hr tablet Take 3 tablets (450 mg total) by mouth in the morning. 270 tablet 1   Calcium Carbonate-Vitamin D 600-400 MG-UNIT tablet Take 1 tablet by mouth daily.     carbidopa-levodopa (SINEMET CR) 50-200 MG tablet Take 1 tablet by mouth 5 (five)  times daily. 450 tablet 0   carbidopa-levodopa (SINEMET) 25-250 MG tablet Take 1 tablet by mouth in the morning and 1 tablet at 2pm 180 tablet 1   Continuous Blood Gluc Receiver (DEXCOM G7 RECEIVER) DEVI Use as directed to check blood sugar 1 each 1   Continuous Blood Gluc Sensor (DEXCOM G7 SENSOR) MISC Use to check blood sugar and change sensor every 10 days 9 each 4   COVID-19 mRNA vaccine 2023-2024 (COMIRNATY) syringe Inject into the muscle. 0.3 mL 0   cyclobenzaprine (FLEXERIL) 5 MG tablet Take 1 tablet (5 mg total) by mouth at bedtime. 10 tablet 0   dapagliflozin propanediol (FARXIGA) 5 MG TABS tablet Take 1 tablet (5 mg total) by mouth daily. 90 tablet 4   EPINEPHrine (EPIPEN 2-PAK) 0.3 mg/0.3 mL IJ SOAJ injection Inject 0.3 mg into the muscle as needed for anaphylaxis. 2 each 1   gabapentin (NEURONTIN) 300 MG capsule Take 1 capsule (300 mg total) by mouth 3 (three) times daily. 270 capsule 2   glucose blood test strip Use to check blood sugar 4 times daily 400 each 3   HYDROcodone-acetaminophen  (NORCO/VICODIN) 5-325 MG tablet Take 1 tablet by mouth every 6 (six) hours as needed for 10 days. 40 tablet 0   influenza vaccine adjuvanted (FLUAD) 0.5 ML injection Inject into the muscle. 0.5 mL 0   insulin degludec (TRESIBA FLEXTOUCH) 100 UNIT/ML FlexTouch Pen INJECT UNDER THE SKIN ONCE DAILY. TRITRATE AS DIRECTED WITH A MAXIMUM DAILY DOSE OF 50 UNITS     insulin lispro (HUMALOG) 100 UNIT/ML injection SQ - 14 units at breakfast, 12 units at lunch, 16 units at supper 10 mL 0   Insulin Pen Needle (TECHLITE PEN NEEDLES) 32G X 6 MM MISC use to Inject insulin into the skin 4 (four) times daily. 400 each 0   LORazepam (ATIVAN) 2 MG tablet Take 1 tablet (2 mg total) by mouth in the morning, at noon, and at bedtime. 270 tablet 1   losartan (COZAAR) 100 MG tablet Take 1 tablet (100 mg total) by mouth daily. 90 tablet 3   meloxicam (MOBIC) 15 MG tablet Take 1 tablet (15 mg total) by mouth daily. 30 tablet 1   mirabegron ER (MYRBETRIQ) 50 MG TB24 tablet Take 1 tablet by mouth everyday 90 tablet 3   Olopatadine HCl (PATADAY) 0.2 % SOLN Place 1 drop into both eyes daily as needed. 2.5 mL 5   omalizumab (XOLAIR) 150 MG/ML prefilled syringe Inject 300 mg into the skin every 28 days. 2 mL 11   propranolol (INDERAL) 40 MG tablet Take 1 tablet (40 mg total) by mouth 2 (two) times daily. 60 tablet 11   rOPINIRole (REQUIP) 2 MG tablet Take 1 tablet (2 mg total) by mouth in the morning and at bedtime. 90 tablet 1   Semaglutide, 2 MG/DOSE, (OZEMPIC, 2 MG/DOSE,) 8 MG/3ML SOPN Inject 2 mg into the skin once a week. 9 mL 0   solifenacin (VESICARE) 10 MG tablet Take 1 tablet (10 mg total) by mouth daily. 30 tablet 11   triamcinolone (NASACORT) 55 MCG/ACT AERO nasal inhaler Place 2 sprays into the nose daily. 16.9 mL 5   valACYclovir (VALTREX) 500 MG tablet Take 1 tablet by mouth everyday 90 tablet 4   vitamin C (ASCORBIC ACID) 500 MG tablet Take 500 mg by mouth daily.     Vitamin D, Ergocalciferol, 2000 units CAPS Take 1  capsule by mouth daily. 2000 units daily.     vortioxetine HBr (TRINTELLIX)  20 MG TABS tablet Take 1 tablet (20 mg total) by mouth in the morning. 90 tablet 1   zolpidem (AMBIEN) 5 MG tablet Take 1 tablet (5 mg total) by mouth at bedtime. 90 tablet 1   Zoster Vaccine Adjuvanted [facility]) injection Inject into the muscle. 0.5 mL 1   brexpiprazole (REXULTI) 2 MG TABS tablet TAKE 1 TABLET BY MOUTH EVERY MORNING 90 tablet 1   levocetirizine (XYZAL) 5 MG tablet Take 1 tablet (5 mg total) by mouth daily as needed. 30 tablet 5   solifenacin (VESICARE) 10 MG tablet TAKE 1 TABLET BY MOUTH ONCE DAILY 30 tablet 11   No current facility-administered medications for this visit.   Facility-Administered Medications Ordered in Other Visits  Medication Dose Route Frequency Provider Last Rate Last Admin   tranexamic acid (CYKLOKAPRON) topical -INTRAOP  2,000 mg Topical Once [place], [employer], PA-C       Allergies: Allergies  Allergen Reactions   Fluticasone-Salmeterol Anaphylaxis   Codeine Nausea And Vomiting   Fetzima [Levomilnacipran] Nausea And Vomiting   Nsaids Other (See Comments)    Upset stomach    Trulicity [Dulaglutide] Nausea And Vomiting    Significant and undesirably rapid (40 lbs) weight loss    Xanax [Alprazolam] Other (See Comments)    disoriented   Amoxicillin Rash    Has patient had a PCN reaction causing immediate rash, facial/tongue/throat swelling, SOB or lightheadedness with hypotension: Yes Has patient had a PCN reaction causing severe rash involving mucus membranes or skin necrosis: No Has patient had a PCN reaction that required hospitalization: No Has patient had a PCN reaction occurring within the last 10 years: No If all of the above answers are "NO", then may proceed with Cephalosporin use.    Pseudoephedrine Hcl Er Palpitations   I reviewed her past medical history, social history, family history, and environmental history and no significant changes have been reported  from her previous visit.  ROS: All others negative except as noted per HPI.   Objective: BP 120/80   Pulse 74   Temp (!) 97.3 F (36.3 C) (Temporal)   Resp 16   SpO2 96%  There is no height or weight on file to calculate BMI. General Appearance:  Alert, cooperative, no distress, appears stated age  Head:  Normocephalic, without obvious abnormality, atraumatic  Eyes:  Conjunctiva clear, EOM's intact  Nose: Nares normal, normal mucosa, no visible anterior polyps, and septum midline  Throat: Lips, tongue normal; teeth and gums normal, normal posterior oropharynx and no tonsillar exudate  Neck: Supple, symmetrical  Lungs:   clear to auscultation bilaterally, Respirations unlabored, no coughing  Heart:  regular rate and rhythm and no murmur, Appears well perfused  Extremities: No edema  Skin: Skin color, texture, turgor normal, no rashes or lesions on visualized portions of skin   Neurologic: No gross deficits   Previous notes and tests were reviewed. The plan was reviewed with the patient/family, and all questions/concerned were addressed.  It was my pleasure to see Deanna Rowe today and participate in her care. Please feel free to contact me with any questions or concerns.  Sincerely,  Roney [name], MD  Allergy & Immunology  [facility] Office: [phone]

## 2022-06-20 NOTE — Patient Instructions (Addendum)
Asthma: well controlled  We can consider stepping down symbicort at follow up if you remain well controlled  Continue Symbicort 160/4.5 mcg to-2 puffs twice a day with a spacer to prevent cough or wheeze.   Continue albuterol 2 puffs every 4 hours as needed for cough, wheeze, tightness in chest, or shortness of breath Continue Xolair injections 300 mg once every 4 weeks and have access to an epinephrine auto-injector set.    Allergic rhinitis: well controlled  Continue Nasacort 2 sprays in each nostril once a day as needed for stuffy nose Continue saline nasal rinses as needed for nasal symptoms. Use this before any medicated nasal sprays for best result Continue azelastine nasal spray 2 sprays in each nostril twice a day as needed for drainage down throat/runny nose May use Xyzal 5 mg once a day only as needed for a runny nose or itch Continue allergen avoidance measures directed toward dust mites, cat, and dog as listed below  Allergic conjunctivitis Some over the counter eye drops include Pataday one drop in each eye once a day as needed for red, itchy eyes OR Zaditor one drop in each eye twice a day as needed for red itchy eyes  Reflux: not well controlled  Continue dietary and lifestyle modifications  Follow up with GI for achalasia and refractory reflux symptoms   Please let us know if this treatment plan is not working well for you  Follow up: 6 month  Thank you so much for letting me partake in your care today.  Don't hesitate to reach out if you have any additional concerns!  Roney [name], MD  [facility] [place] [place]  Gastroesophageal Reflux Induced Respiratory Disease and Laryngopharyngeal Reflux (LPR): Gastroesophageal reflux disease (GERD) is a condition where the contents of the stomach reflux or back up into the esophagus or swallowing tube.  This can result in a variety of clinical symptoms including classic symptoms and atypical symptoms.  Classic  symptoms of GERD include: heartburn, chest pain, acid taste in the mouth, and difficulty in swallowing.  Atypical symptoms of GERD include laryngopharyngeal reflux (LPR) and asthma.  LPR occurs when stomach reflux comes all the way up to the throat.  Clinical symptoms include hoarseness, raspy voice, laryngitis, throat clearing, postnasal drip, mucus stuck in the throat, a sensation of a lump in the throat, sore throat, and cough.  Most patients with LPR do not have classic symptoms of GERD.  Asthma can also be triggered by GERD.  The acid stomach fluid can stimulate nerve fibers in the esophagus which can cause an increase in bronchial muscle tone and narrowing of the airways.  Acid stomach contents may also reflux into the trachea and bronchi of the lungs where it can trigger an asthma attack.  Many people with GERD triggered asthma do not have classic symptoms of GERD.  Diagnosis of LPR and GERD induced asthma is frequently made from a typical history and response to medications.  It may take several months of medications to see a good response.  Occasionally, a 24-hour esophageal pH probe study must be performed.  Treatment of GERD/LPR includes:   Modification of diet and lifestyle Stop smoking Avoid overeating and lose weight Avoid acidic and fatty foods, chocolate, onions, garlic, peppermint Elevate the head of your bed 6 to 8 inches with blocks or wedge Medications Zantac, Pepcid, Axid, Tagamet Prilosec, Prevacid, Aciphex, Protonix, Nexium Surgery

## 2022-06-23 ENCOUNTER — Other Ambulatory Visit (HOSPITAL_BASED_OUTPATIENT_CLINIC_OR_DEPARTMENT_OTHER): Payer: Self-pay

## 2022-06-23 ENCOUNTER — Other Ambulatory Visit (HOSPITAL_COMMUNITY): Payer: Self-pay

## 2022-06-23 ENCOUNTER — Encounter (INDEPENDENT_AMBULATORY_CARE_PROVIDER_SITE_OTHER): Payer: Self-pay | Admitting: Family Medicine

## 2022-06-23 ENCOUNTER — Ambulatory Visit (INDEPENDENT_AMBULATORY_CARE_PROVIDER_SITE_OTHER): Payer: Commercial Managed Care - PPO | Admitting: Family Medicine

## 2022-06-23 VITALS — BP 110/67 | HR 67 | Temp 97.7°F | Ht 66.0 in | Wt 213.0 lb

## 2022-06-23 DIAGNOSIS — Z794 Long term (current) use of insulin: Secondary | ICD-10-CM | POA: Diagnosis not present

## 2022-06-23 DIAGNOSIS — Z6834 Body mass index (BMI) 34.0-34.9, adult: Secondary | ICD-10-CM | POA: Diagnosis not present

## 2022-06-23 DIAGNOSIS — E1169 Type 2 diabetes mellitus with other specified complication: Secondary | ICD-10-CM | POA: Diagnosis not present

## 2022-06-23 DIAGNOSIS — E669 Obesity, unspecified: Secondary | ICD-10-CM | POA: Diagnosis not present

## 2022-06-23 DIAGNOSIS — Z7985 Long-term (current) use of injectable non-insulin antidiabetic drugs: Secondary | ICD-10-CM | POA: Diagnosis not present

## 2022-06-23 MED ORDER — TIRZEPATIDE 15 MG/0.5ML ~~LOC~~ SOAJ
15.0000 mg | SUBCUTANEOUS | 0 refills | Status: DC
  Filled 2022-06-23: qty 2, 28d supply, fill #0

## 2022-06-27 ENCOUNTER — Other Ambulatory Visit (HOSPITAL_BASED_OUTPATIENT_CLINIC_OR_DEPARTMENT_OTHER): Payer: Self-pay

## 2022-06-27 ENCOUNTER — Other Ambulatory Visit (HOSPITAL_COMMUNITY): Payer: Self-pay

## 2022-06-28 NOTE — Progress Notes (Signed)
Chief Complaint:   OBESITY Endea is here to discuss her progress with her obesity treatment plan along with follow-up of her obesity related diagnoses. Keydi is on the Category 2 Plan and states she is following her eating plan approximately 50% of the time. Kathee states she is doing physical therapy 2 times per week.    Today's visit was #: 65 Starting weight: 237 lbs Starting date: 04/16/2019 Today's weight: 213 lbs Today's date: 06/23/2022 Total lbs lost to date: 24 Total lbs lost since last in-office visit: 0  Interim History: Caliya has been struggling with increased hunger and she finds following her Category 2 plan more difficult.  She is open to looking at other eating plan options.  Subjective:   1. Type 2 diabetes mellitus with other specified complication, with long-term current use of insulin (HCC) Shacola's recent A1c at [facility] with Dr. Buddy Duty was 6.4 per the patient.  She notes polyphagia is worse on Ozempic versus Wegovy, and she is open to looking at other medication options.  Assessment/Plan:   1. Type 2 diabetes mellitus with other specified complication, with long-term current use of insulin ([place]) Tolulope agreed to discontinue Ozempic and start Mounjaro 15 mg once weekly with no refills.  She will continue her other medications as prescribed.  - tirzepatide (MOUNJARO) 15 MG/0.5ML Pen; Inject 15 mg into the skin once a week.  Dispense: 6 mL; Refill: 0  2. BMI 34.0-34.9,adult  3. Obesity, Beginning BMI 39.44 Kennie is currently in the action stage of change. As such, her goal is to continue with weight loss efforts. She has agreed to change to following a lower carbohydrate, vegetable and lean protein rich diet plan (Atkins).   Exercise goals: As is.   Behavioral modification strategies: increasing lean protein intake.  Kimetha has agreed to follow-up with our clinic in 6 weeks. She was informed of the importance of frequent follow-up visits to maximize  her success with intensive lifestyle modifications for her multiple health conditions.   Objective:   Blood pressure 110/67, pulse 67, temperature 97.7 F (36.5 C), height 5\' 6"  (1.676 m), weight 213 lb (96.6 kg), SpO2 94 %. Body mass index is 34.38 kg/m.  Lab Results  Component Value Date   CREATININE 1.32 (H) 04/05/2022   BUN 19 04/05/2022   NA 138 04/05/2022   K 5.5 (H) 04/05/2022   CL 100 04/05/2022   CO2 23 04/05/2022   Lab Results  Component Value Date   ALT 10 04/05/2022   AST 18 04/05/2022   ALKPHOS 54 04/05/2022   BILITOT 0.3 04/05/2022   Lab Results  Component Value Date   HGBA1C 7.9 (H) 04/16/2019   HGBA1C 6.9 11/25/2016   HGBA1C 6.0 06/10/2016   HGBA1C 5.9 02/12/2016   HGBA1C 6.1 12/01/2015   Lab Results  Component Value Date   INSULIN 12.3 04/16/2019   Lab Results  Component Value Date   TSH 1.200 04/05/2022   Lab Results  Component Value Date   CHOL 155 04/16/2019   HDL 84 04/16/2019   LDLCALC 57 04/16/2019   TRIG 71 04/16/2019   Lab Results  Component Value Date   VD25OH 44.8 12/17/2019   VD25OH 40.9 04/16/2019   Lab Results  Component Value Date   WBC 7.2 04/05/2022   HGB 12.6 04/05/2022   HCT 36.0 04/05/2022   MCV 98 (H) 04/05/2022   PLT 255 04/05/2022   No results found for: "IRON", "TIBC", "FERRITIN"  Attestation Statements:   Reviewed  by clinician on day of visit: allergies, medications, problem list, medical history, surgical history, family history, social history, and previous encounter notes.  Time spent on visit including pre-visit chart review and post-visit care and charting was 35 minutes.   I, Trixie Dredge, am acting as transcriptionist for Dennard Nip, MD.  I have reviewed the above documentation for accuracy and completeness, and I agree with the above. -  Dennard Nip, MD

## 2022-06-29 ENCOUNTER — Other Ambulatory Visit (HOSPITAL_BASED_OUTPATIENT_CLINIC_OR_DEPARTMENT_OTHER): Payer: Self-pay

## 2022-07-01 IMAGING — MG MM DIGITAL SCREENING BILAT W/ TOMO AND CAD
6 of 12 series · 6 of 36 positions shown · non-contrast
Comparison: Previous exam(s).

CLINICAL DATA: Screening.

EXAM:
DIGITAL SCREENING BILATERAL MAMMOGRAM WITH TOMOSYNTHESIS AND CAD
TECHNIQUE: Bilateral screening digital craniocaudal and mediolateral oblique
mammograms were obtained. Bilateral screening digital breast
tomosynthesis was performed. The images were evaluated with
computer-aided detection.

[R MLO synth-2D (1 of 2)]
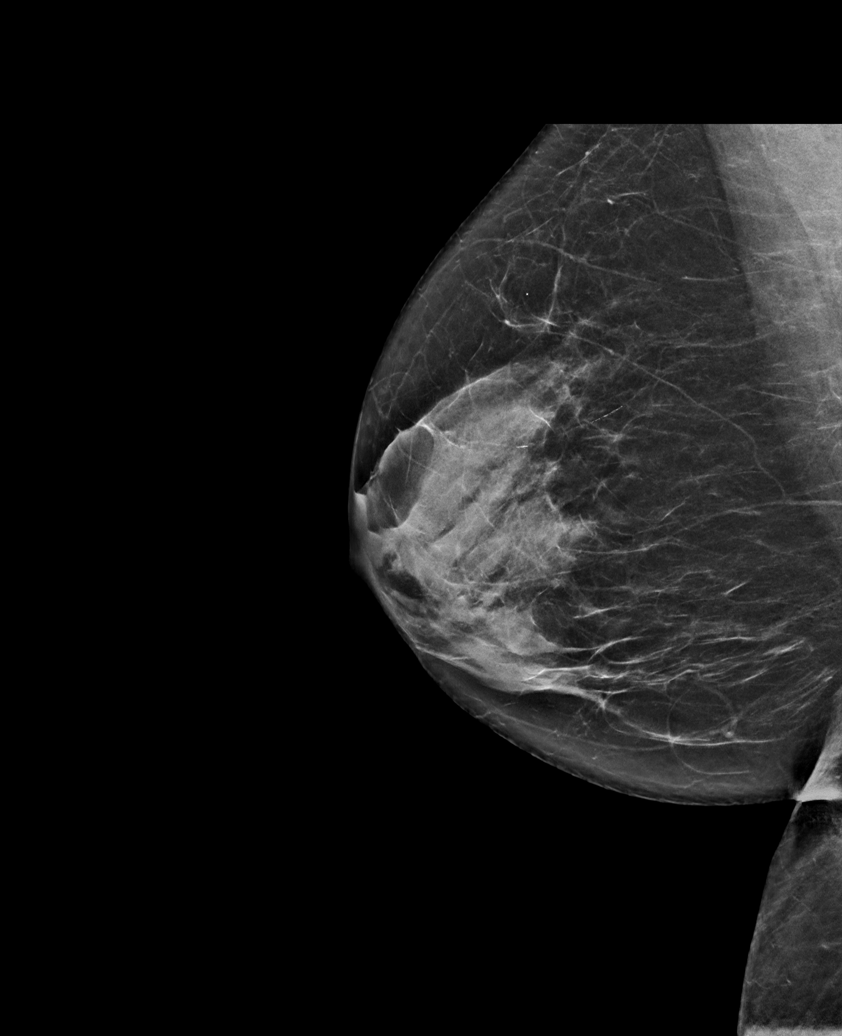

[R CC synth-2D]
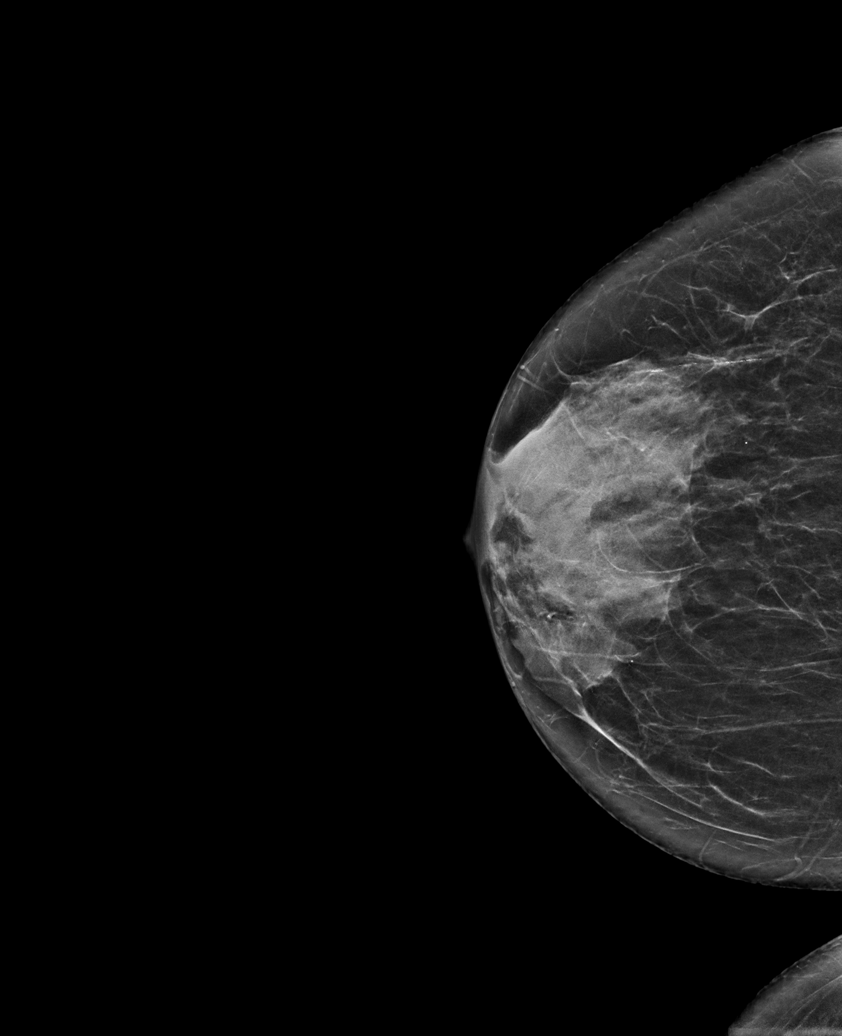

[L CC synth-2D]
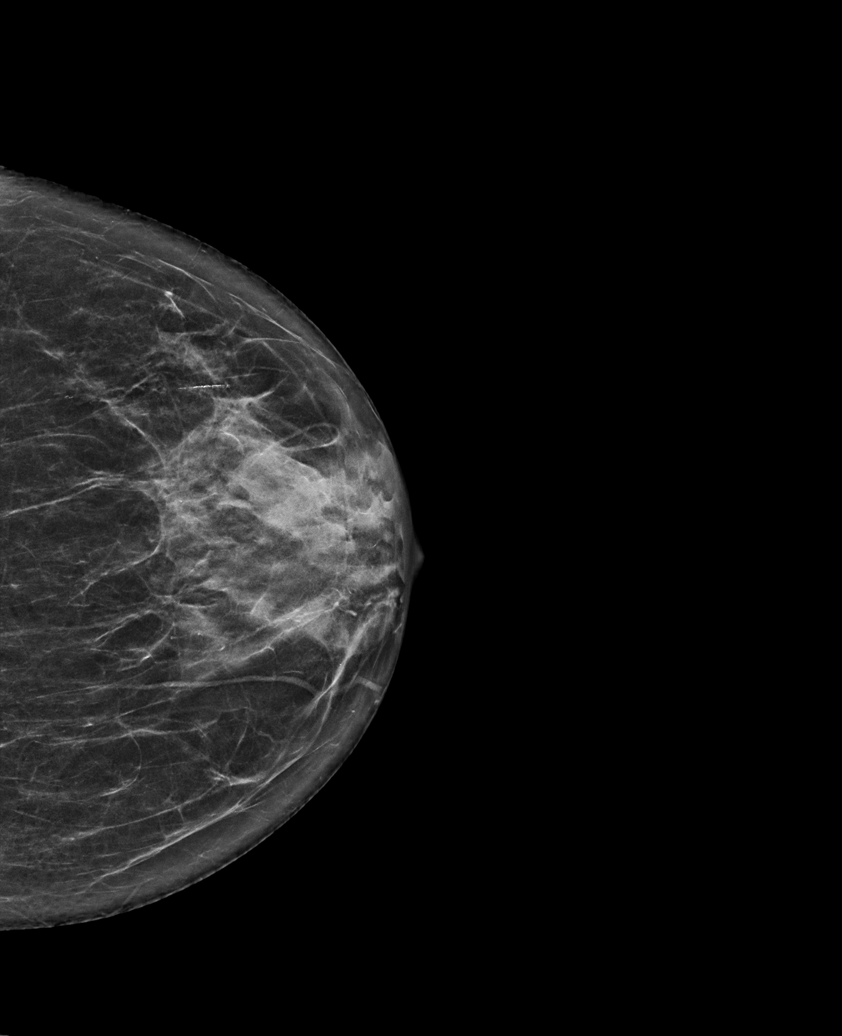

[L MLO synth-2D (1 of 2)]
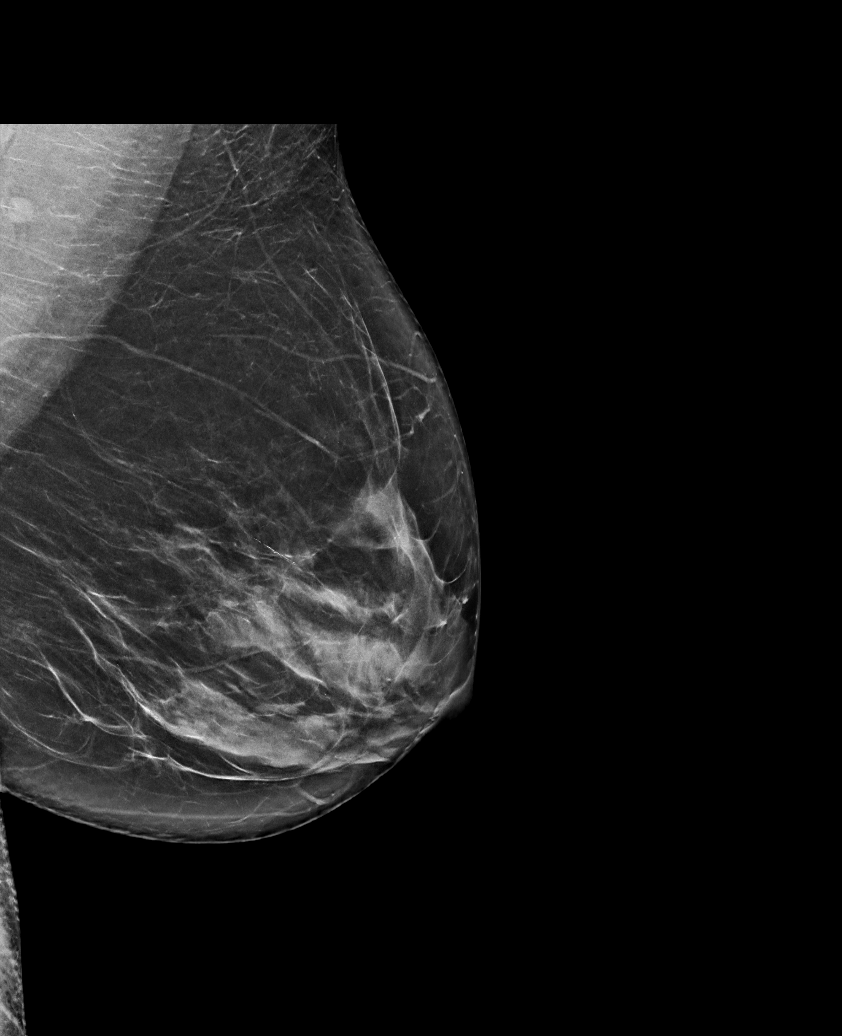

[L MLO synth-2D (2 of 2)]
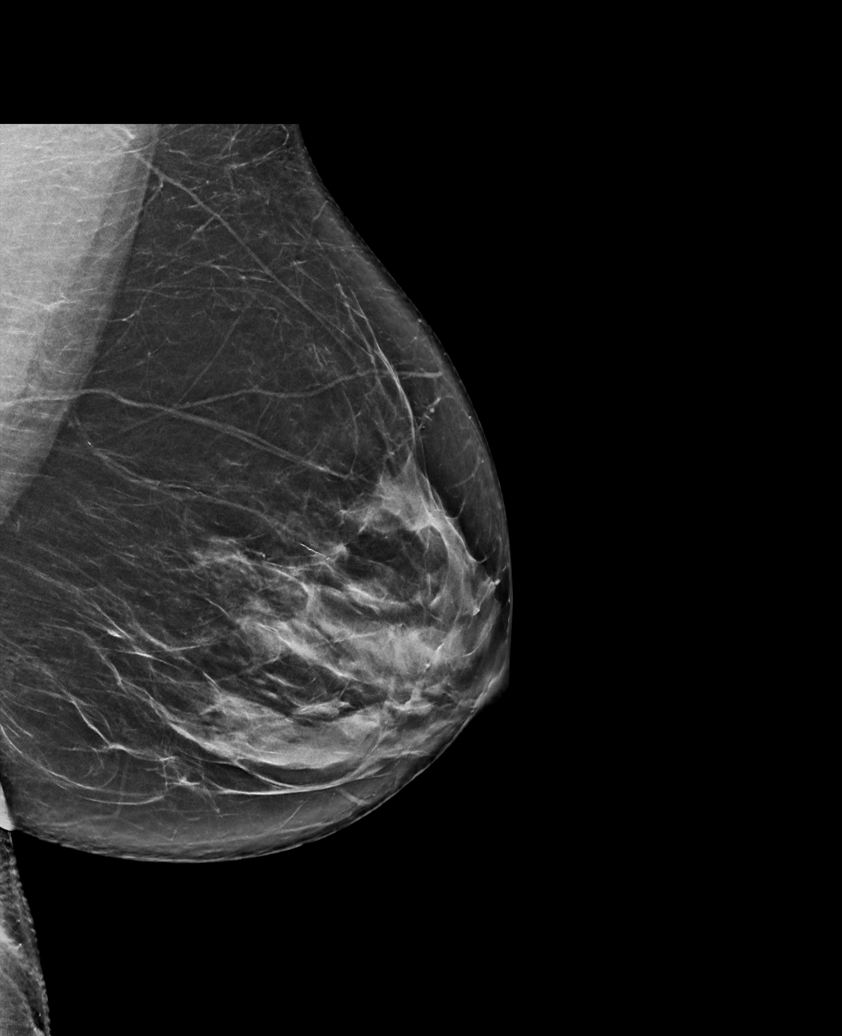

[R MLO synth-2D (2 of 2)]
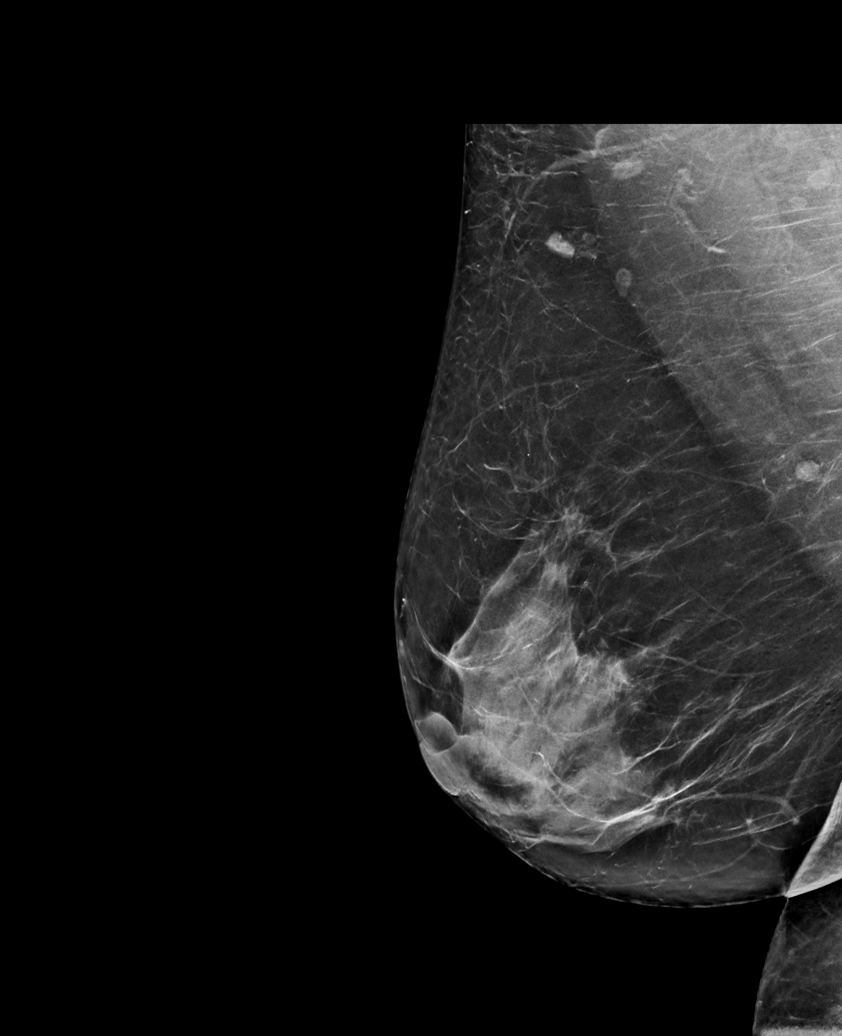

[6 of 36 positions shown; findings below may reference images not displayed]

ACR Breast Density Category c: The breast tissue is heterogeneously
dense, which may obscure small masses.
FINDINGS: There are no findings suspicious for malignancy.
IMPRESSION: No mammographic evidence of malignancy. A result letter of this
screening mammogram will be mailed directly to the patient.

RECOMMENDATION:
Screening mammogram in one year. (Code:[id number])

BI-RADS CATEGORY  1: Negative.

## 2022-07-04 ENCOUNTER — Other Ambulatory Visit (HOSPITAL_COMMUNITY): Payer: Self-pay

## 2022-07-04 ENCOUNTER — Other Ambulatory Visit (HOSPITAL_BASED_OUTPATIENT_CLINIC_OR_DEPARTMENT_OTHER): Payer: Self-pay

## 2022-07-05 ENCOUNTER — Other Ambulatory Visit (HOSPITAL_BASED_OUTPATIENT_CLINIC_OR_DEPARTMENT_OTHER): Payer: Self-pay

## 2022-07-07 ENCOUNTER — Other Ambulatory Visit (HOSPITAL_BASED_OUTPATIENT_CLINIC_OR_DEPARTMENT_OTHER): Payer: Self-pay

## 2022-07-07 ENCOUNTER — Telehealth: Payer: Self-pay | Admitting: Physical Therapy

## 2022-07-07 ENCOUNTER — Other Ambulatory Visit: Payer: Self-pay

## 2022-07-07 DIAGNOSIS — G20C Parkinsonism, unspecified: Secondary | ICD-10-CM

## 2022-07-07 MED ORDER — ROPINIROLE HCL 2 MG PO TABS
2.0000 mg | ORAL_TABLET | Freq: Two times a day (BID) | ORAL | 1 refills | Status: DC
  Filled 2022-07-07: qty 180, 90d supply, fill #0
  Filled 2022-10-03: qty 180, 90d supply, fill #1

## 2022-07-07 NOTE — Telephone Encounter (Signed)
Neuro rehab referral placed.  

## 2022-07-07 NOTE — Telephone Encounter (Signed)
Dr. Rexene Alberts,  Deanna Rowe is scheduled for  OT, PT  re-evaluations on 07/25/22 as recommended when pt was last discharged from therapy due to progressive nature of diagnosis.  Pt was in agreement with this plan.  If you are in agreement, please send updated order for OT and PT via epic.  Thank you, [name], PT, DPT 07/07/22 8:49 AM   [facility] [address] [place], [place]  [address] Phone:  [phone] Fax:  [phone]

## 2022-07-08 ENCOUNTER — Other Ambulatory Visit (HOSPITAL_BASED_OUTPATIENT_CLINIC_OR_DEPARTMENT_OTHER): Payer: Self-pay

## 2022-07-11 ENCOUNTER — Other Ambulatory Visit (HOSPITAL_BASED_OUTPATIENT_CLINIC_OR_DEPARTMENT_OTHER): Payer: Self-pay

## 2022-07-11 ENCOUNTER — Other Ambulatory Visit: Payer: Self-pay

## 2022-07-11 ENCOUNTER — Other Ambulatory Visit (INDEPENDENT_AMBULATORY_CARE_PROVIDER_SITE_OTHER): Payer: Self-pay | Admitting: Family Medicine

## 2022-07-11 DIAGNOSIS — E1169 Type 2 diabetes mellitus with other specified complication: Secondary | ICD-10-CM

## 2022-07-12 ENCOUNTER — Other Ambulatory Visit (HOSPITAL_BASED_OUTPATIENT_CLINIC_OR_DEPARTMENT_OTHER): Payer: Self-pay

## 2022-07-18 ENCOUNTER — Other Ambulatory Visit (HOSPITAL_BASED_OUTPATIENT_CLINIC_OR_DEPARTMENT_OTHER): Payer: Self-pay

## 2022-07-19 ENCOUNTER — Other Ambulatory Visit (HOSPITAL_BASED_OUTPATIENT_CLINIC_OR_DEPARTMENT_OTHER): Payer: Self-pay

## 2022-07-21 ENCOUNTER — Other Ambulatory Visit (HOSPITAL_BASED_OUTPATIENT_CLINIC_OR_DEPARTMENT_OTHER): Payer: Self-pay

## 2022-07-21 DIAGNOSIS — K5900 Constipation, unspecified: Secondary | ICD-10-CM | POA: Diagnosis not present

## 2022-07-21 DIAGNOSIS — Z6835 Body mass index (BMI) 35.0-35.9, adult: Secondary | ICD-10-CM | POA: Diagnosis not present

## 2022-07-21 MED ORDER — PRUCALOPRIDE SUCCINATE 2 MG PO TABS
2.0000 mg | ORAL_TABLET | Freq: Every day | ORAL | 0 refills | Status: DC
  Filled 2022-07-21 – 2022-07-27 (×2): qty 30, 30d supply, fill #0

## 2022-07-22 DIAGNOSIS — E1165 Type 2 diabetes mellitus with hyperglycemia: Secondary | ICD-10-CM | POA: Diagnosis not present

## 2022-07-22 DIAGNOSIS — Z794 Long term (current) use of insulin: Secondary | ICD-10-CM | POA: Diagnosis not present

## 2022-07-25 ENCOUNTER — Ambulatory Visit
Admission: RE | Admit: 2022-07-25 | Discharge: 2022-07-25 | Disposition: A | Discharge status: Home / Self Care | Payer: Commercial Managed Care - PPO | Source: Ambulatory Visit | Attending: Family Medicine | Admitting: Family Medicine

## 2022-07-25 ENCOUNTER — Ambulatory Visit: Payer: Medicare Other | Admitting: Physical Therapy

## 2022-07-25 ENCOUNTER — Other Ambulatory Visit: Payer: Self-pay | Admitting: Family Medicine

## 2022-07-25 ENCOUNTER — Other Ambulatory Visit (HOSPITAL_COMMUNITY): Payer: Self-pay

## 2022-07-25 ENCOUNTER — Ambulatory Visit: Payer: Medicare Other | Admitting: Occupational Therapy

## 2022-07-25 DIAGNOSIS — K5900 Constipation, unspecified: Secondary | ICD-10-CM

## 2022-07-25 DIAGNOSIS — K5901 Slow transit constipation: Secondary | ICD-10-CM | POA: Diagnosis not present

## 2022-07-26 ENCOUNTER — Other Ambulatory Visit (HOSPITAL_BASED_OUTPATIENT_CLINIC_OR_DEPARTMENT_OTHER): Payer: Self-pay

## 2022-07-26 ENCOUNTER — Other Ambulatory Visit (HOSPITAL_COMMUNITY): Payer: Self-pay

## 2022-07-26 ENCOUNTER — Other Ambulatory Visit: Payer: Self-pay | Admitting: Pharmacist

## 2022-07-26 MED ORDER — OMALIZUMAB 150 MG/ML ~~LOC~~ SOSY
300.0000 mg | PREFILLED_SYRINGE | SUBCUTANEOUS | 11 refills | Status: DC
  Filled 2022-07-26: qty 2, 28d supply, fill #0

## 2022-07-26 MED ORDER — OMALIZUMAB 150 MG/ML ~~LOC~~ SOSY
300.0000 mg | PREFILLED_SYRINGE | SUBCUTANEOUS | 11 refills | Status: DC
  Filled 2022-07-26: qty 2, 28d supply, fill #0
  Filled 2022-08-18: qty 2, 28d supply, fill #1
  Filled 2022-09-19: qty 2, 28d supply, fill #2
  Filled 2022-10-19: qty 2, 28d supply, fill #3
  Filled 2022-11-16: qty 2, 28d supply, fill #4
  Filled 2022-12-13: qty 2, 28d supply, fill #5
  Filled 2023-01-13: qty 2, 28d supply, fill #6
  Filled 2023-02-14: qty 2, 28d supply, fill #7
  Filled 2023-03-13: qty 2, 28d supply, fill #8
  Filled 2023-04-14: qty 2, 28d supply, fill #9
  Filled 2023-05-18: qty 2, 28d supply, fill #10
  Filled 2023-06-19 (×2): qty 2, 28d supply, fill #11

## 2022-07-27 ENCOUNTER — Other Ambulatory Visit (HOSPITAL_BASED_OUTPATIENT_CLINIC_OR_DEPARTMENT_OTHER): Payer: Self-pay

## 2022-08-02 ENCOUNTER — Other Ambulatory Visit (HOSPITAL_BASED_OUTPATIENT_CLINIC_OR_DEPARTMENT_OTHER): Payer: Self-pay

## 2022-08-02 ENCOUNTER — Other Ambulatory Visit: Payer: Self-pay

## 2022-08-04 ENCOUNTER — Ambulatory Visit (INDEPENDENT_AMBULATORY_CARE_PROVIDER_SITE_OTHER): Payer: Commercial Managed Care - PPO | Admitting: Family Medicine

## 2022-08-04 ENCOUNTER — Other Ambulatory Visit (HOSPITAL_BASED_OUTPATIENT_CLINIC_OR_DEPARTMENT_OTHER): Payer: Self-pay

## 2022-08-04 ENCOUNTER — Encounter (INDEPENDENT_AMBULATORY_CARE_PROVIDER_SITE_OTHER): Payer: Self-pay | Admitting: Family Medicine

## 2022-08-04 VITALS — BP 135/83 | HR 74 | Temp 97.8°F | Ht 66.0 in | Wt 214.0 lb

## 2022-08-04 DIAGNOSIS — E669 Obesity, unspecified: Secondary | ICD-10-CM | POA: Diagnosis not present

## 2022-08-04 DIAGNOSIS — K5909 Other constipation: Secondary | ICD-10-CM | POA: Diagnosis not present

## 2022-08-04 DIAGNOSIS — Z6834 Body mass index (BMI) 34.0-34.9, adult: Secondary | ICD-10-CM | POA: Diagnosis not present

## 2022-08-04 DIAGNOSIS — E1169 Type 2 diabetes mellitus with other specified complication: Secondary | ICD-10-CM | POA: Diagnosis not present

## 2022-08-04 DIAGNOSIS — Z794 Long term (current) use of insulin: Secondary | ICD-10-CM | POA: Diagnosis not present

## 2022-08-04 DIAGNOSIS — Z7985 Long-term (current) use of injectable non-insulin antidiabetic drugs: Secondary | ICD-10-CM

## 2022-08-04 MED ORDER — TIRZEPATIDE 15 MG/0.5ML ~~LOC~~ SOAJ
15.0000 mg | SUBCUTANEOUS | 0 refills | Status: DC
Start: 2022-08-04 — End: 2022-09-07
  Filled 2022-08-04: qty 6, 84d supply, fill #0

## 2022-08-08 ENCOUNTER — Other Ambulatory Visit (HOSPITAL_BASED_OUTPATIENT_CLINIC_OR_DEPARTMENT_OTHER): Payer: Self-pay

## 2022-08-08 ENCOUNTER — Other Ambulatory Visit: Payer: Self-pay

## 2022-08-08 DIAGNOSIS — R350 Frequency of micturition: Secondary | ICD-10-CM | POA: Diagnosis not present

## 2022-08-08 DIAGNOSIS — R3915 Urgency of urination: Secondary | ICD-10-CM | POA: Diagnosis not present

## 2022-08-08 DIAGNOSIS — R8279 Other abnormal findings on microbiological examination of urine: Secondary | ICD-10-CM | POA: Diagnosis not present

## 2022-08-08 DIAGNOSIS — N3941 Urge incontinence: Secondary | ICD-10-CM | POA: Diagnosis not present

## 2022-08-08 NOTE — Progress Notes (Signed)
Chief Complaint:   OBESITY Deanna Rowe is here to discuss her progress with her obesity treatment plan along with follow-up of her obesity related diagnoses. Deanna Rowe is on following a lower carbohydrate, vegetable and lean protein rich diet plan (Atkins) and states she is following her eating plan approximately 50% of the time. Deanna Rowe states she is doing physical therapy 3 times per week.   Today's visit was #: 45 Starting weight: 237 lbs Starting date: 04/16/2019 Today's weight: 214 lbs Today's date: 08/04/2022 Total lbs lost to date: 23 Total lbs lost since last in-office visit: 0  Interim History: Kaeya has been working on following her diet and she has started doing group exercises for Parkinson's.  She feels she is stronger with better balance.  She is decreasing protein for breakfast and her simple carbohydrates are higher in the morning.  Subjective:   1. Type 2 diabetes mellitus with other specified complication, with long-term current use of insulin (HCC) Deanna Rowe is on Mounjaro now, and she had her first injection this morning.  She is changing from Ozempic.  She has no problems so far with nausea or vomiting.  2. Other constipation Deanna Rowe notes decrease in BM frequency and harder BM.  She has started MiraLAX and she feels this is starting to help.  Assessment/Plan:   1. Type 2 diabetes mellitus with other specified complication, with long-term current use of insulin (HCC) Deanna Rowe will continue Mounjaro at 15 mg once weekly, we will refill for 1 month.  - tirzepatide (MOUNJARO) 15 MG/0.5ML Pen; Inject 15 mg into the skin once a week.  Dispense: 6 mL; Refill: 0  2. Other constipation Deanna Rowe will continue MiraLAX 17 g daily, and increase her water intake.  3. BMI 34.0-34.9,adult  4. Obesity, Beginning BMI 39.44 Deanna Rowe is currently in the action stage of change. As such, her goal is to continue with weight loss efforts. She has agreed to the Category 3 Plan with 30  grams of protein at breakfast.   Exercise goals: As is.   Behavioral modification strategies: increasing lean protein intake and decreasing simple carbohydrates.  Deanna Rowe has agreed to follow-up with our clinic in 4 weeks. She was informed of the importance of frequent follow-up visits to maximize her success with intensive lifestyle modifications for her multiple health conditions.   Objective:   Blood pressure 135/83, pulse 74, temperature 97.8 F (36.6 C), height 5\' 6"  (1.676 m), weight 214 lb (97.1 kg), SpO2 96 %. Body mass index is 34.54 kg/m.  Lab Results  Component Value Date   CREATININE 1.32 (H) 04/05/2022   BUN 19 04/05/2022   NA 138 04/05/2022   K 5.5 (H) 04/05/2022   CL 100 04/05/2022   CO2 23 04/05/2022   Lab Results  Component Value Date   ALT 10 04/05/2022   AST 18 04/05/2022   ALKPHOS 54 04/05/2022   BILITOT 0.3 04/05/2022   Lab Results  Component Value Date   HGBA1C 7.9 (H) 04/16/2019   HGBA1C 6.9 11/25/2016   HGBA1C 6.0 06/10/2016   HGBA1C 5.9 02/12/2016   HGBA1C 6.1 12/01/2015   Lab Results  Component Value Date   INSULIN 12.3 04/16/2019   Lab Results  Component Value Date   TSH 1.200 04/05/2022   Lab Results  Component Value Date   CHOL 155 04/16/2019   HDL 84 04/16/2019   LDLCALC 57 04/16/2019   TRIG 71 04/16/2019   Lab Results  Component Value Date   VD25OH 44.8 12/17/2019  VD25OH 40.9 04/16/2019   Lab Results  Component Value Date   WBC 7.2 04/05/2022   HGB 12.6 04/05/2022   HCT 36.0 04/05/2022   MCV 98 (H) 04/05/2022   PLT 255 04/05/2022   No results found for: "IRON", "TIBC", "FERRITIN"  Attestation Statements:   Reviewed by clinician on day of visit: allergies, medications, problem list, medical history, surgical history, family history, social history, and previous encounter notes.   I, Burt Knack, am acting as transcriptionist for Quillian Quince, MD.  I have reviewed the above documentation for accuracy and  completeness, and I agree with the above. -  Quillian Quince, MD

## 2022-08-11 ENCOUNTER — Other Ambulatory Visit: Payer: Self-pay

## 2022-08-11 ENCOUNTER — Other Ambulatory Visit (HOSPITAL_BASED_OUTPATIENT_CLINIC_OR_DEPARTMENT_OTHER): Payer: Self-pay

## 2022-08-15 ENCOUNTER — Other Ambulatory Visit: Payer: Self-pay | Admitting: Adult Health

## 2022-08-15 ENCOUNTER — Ambulatory Visit: Payer: Medicare Other | Admitting: Adult Health

## 2022-08-15 ENCOUNTER — Other Ambulatory Visit (HOSPITAL_BASED_OUTPATIENT_CLINIC_OR_DEPARTMENT_OTHER): Payer: Self-pay

## 2022-08-15 MED ORDER — CARBIDOPA-LEVODOPA ER 50-200 MG PO TBCR
1.0000 | EXTENDED_RELEASE_TABLET | Freq: Every day | ORAL | 0 refills | Status: DC
  Filled 2022-08-25: qty 150, 30d supply, fill #0

## 2022-08-17 ENCOUNTER — Ambulatory Visit: Payer: Medicare Other | Admitting: Physical Therapy

## 2022-08-17 ENCOUNTER — Ambulatory Visit: Payer: Medicare Other | Admitting: Occupational Therapy

## 2022-08-18 ENCOUNTER — Other Ambulatory Visit (HOSPITAL_COMMUNITY): Payer: Self-pay

## 2022-08-19 ENCOUNTER — Other Ambulatory Visit: Payer: Self-pay

## 2022-08-22 ENCOUNTER — Other Ambulatory Visit: Payer: Self-pay | Admitting: Neurology

## 2022-08-23 ENCOUNTER — Other Ambulatory Visit: Payer: Self-pay

## 2022-08-24 ENCOUNTER — Other Ambulatory Visit (HOSPITAL_BASED_OUTPATIENT_CLINIC_OR_DEPARTMENT_OTHER): Payer: Self-pay

## 2022-08-24 MED ORDER — CARBIDOPA-LEVODOPA 25-250 MG PO TABS
ORAL_TABLET | ORAL | 1 refills | Status: DC
  Filled 2022-08-24: qty 180, 90d supply, fill #0
  Filled 2022-11-18: qty 60, 30d supply, fill #1
  Filled 2023-01-09: qty 60, 30d supply, fill #2
  Filled 2023-02-28: qty 60, 30d supply, fill #3

## 2022-08-25 ENCOUNTER — Other Ambulatory Visit (HOSPITAL_BASED_OUTPATIENT_CLINIC_OR_DEPARTMENT_OTHER): Payer: Self-pay

## 2022-08-26 DIAGNOSIS — E113299 Type 2 diabetes mellitus with mild nonproliferative diabetic retinopathy without macular edema, unspecified eye: Secondary | ICD-10-CM | POA: Diagnosis not present

## 2022-08-26 DIAGNOSIS — I700 Atherosclerosis of aorta: Secondary | ICD-10-CM | POA: Diagnosis not present

## 2022-08-26 DIAGNOSIS — E042 Nontoxic multinodular goiter: Secondary | ICD-10-CM | POA: Diagnosis not present

## 2022-08-26 DIAGNOSIS — N1831 Chronic kidney disease, stage 3a: Secondary | ICD-10-CM | POA: Diagnosis not present

## 2022-08-26 DIAGNOSIS — E1151 Type 2 diabetes mellitus with diabetic peripheral angiopathy without gangrene: Secondary | ICD-10-CM | POA: Diagnosis not present

## 2022-08-27 ENCOUNTER — Emergency Department (HOSPITAL_BASED_OUTPATIENT_CLINIC_OR_DEPARTMENT_OTHER)
Admission: EM | Admit: 2022-08-27 | Discharge: 2022-08-27 | Disposition: A | Discharge status: Home / Self Care | Payer: Commercial Managed Care - PPO | Attending: Emergency Medicine | Admitting: Emergency Medicine

## 2022-08-27 ENCOUNTER — Encounter (HOSPITAL_BASED_OUTPATIENT_CLINIC_OR_DEPARTMENT_OTHER): Payer: Self-pay | Admitting: Emergency Medicine

## 2022-08-27 ENCOUNTER — Emergency Department (HOSPITAL_BASED_OUTPATIENT_CLINIC_OR_DEPARTMENT_OTHER): Payer: Commercial Managed Care - PPO

## 2022-08-27 ENCOUNTER — Other Ambulatory Visit: Payer: Self-pay

## 2022-08-27 DIAGNOSIS — I10 Essential (primary) hypertension: Secondary | ICD-10-CM | POA: Diagnosis not present

## 2022-08-27 DIAGNOSIS — Z794 Long term (current) use of insulin: Secondary | ICD-10-CM | POA: Insufficient documentation

## 2022-08-27 DIAGNOSIS — Z7982 Long term (current) use of aspirin: Secondary | ICD-10-CM | POA: Diagnosis not present

## 2022-08-27 DIAGNOSIS — E119 Type 2 diabetes mellitus without complications: Secondary | ICD-10-CM | POA: Diagnosis not present

## 2022-08-27 DIAGNOSIS — K5730 Diverticulosis of large intestine without perforation or abscess without bleeding: Secondary | ICD-10-CM | POA: Diagnosis not present

## 2022-08-27 DIAGNOSIS — Z79899 Other long term (current) drug therapy: Secondary | ICD-10-CM | POA: Diagnosis not present

## 2022-08-27 DIAGNOSIS — N281 Cyst of kidney, acquired: Secondary | ICD-10-CM | POA: Diagnosis not present

## 2022-08-27 DIAGNOSIS — R1084 Generalized abdominal pain: Secondary | ICD-10-CM

## 2022-08-27 DIAGNOSIS — N3289 Other specified disorders of bladder: Secondary | ICD-10-CM | POA: Diagnosis not present

## 2022-08-27 DIAGNOSIS — R1032 Left lower quadrant pain: Secondary | ICD-10-CM | POA: Diagnosis present

## 2022-08-27 LAB — COMPREHENSIVE METABOLIC PANEL
ALT: 5 U/L (ref 0–44)
AST: 18 U/L (ref 15–41)
Albumin: 3.5 g/dL (ref 3.5–5.0)
Alkaline Phosphatase: 47 U/L (ref 38–126)
Anion gap: 8 (ref 5–15)
BUN: 16 mg/dL (ref 8–23)
CO2: 24 mmol/L (ref 22–32)
Calcium: 8.7 mg/dL — ABNORMAL LOW (ref 8.9–10.3)
Chloride: 96 mmol/L — ABNORMAL LOW (ref 98–111)
Creatinine, Ser: 1.26 mg/dL — ABNORMAL HIGH (ref 0.44–1.00)
GFR, Estimated: 47 mL/min — ABNORMAL LOW (ref >60)
Glucose, Bld: 84 mg/dL (ref 70–99)
Potassium: 3.9 mmol/L (ref 3.5–5.1)
Sodium: 128 mmol/L — ABNORMAL LOW (ref 135–145)
Total Bilirubin: 0.4 mg/dL (ref 0.3–1.2)
Total Protein: 7.2 g/dL (ref 6.5–8.1)

## 2022-08-27 LAB — URINALYSIS, ROUTINE W REFLEX MICROSCOPIC
Bilirubin Urine: NEGATIVE
Glucose, UA: 100 mg/dL — AB
Hgb urine dipstick: NEGATIVE
Ketones, ur: NEGATIVE mg/dL
Leukocytes,Ua: NEGATIVE
Nitrite: NEGATIVE
Protein, ur: NEGATIVE mg/dL
Specific Gravity, Urine: <=1.005 (ref 1.005–1.030)
pH: 6 (ref 5.0–8.0)

## 2022-08-27 LAB — CBC
HCT: 34.9 % — ABNORMAL LOW (ref 36.0–46.0)
Hemoglobin: 11.9 g/dL — ABNORMAL LOW (ref 12.0–15.0)
MCH: 33.6 pg (ref 26.0–34.0)
MCHC: 34.1 g/dL (ref 30.0–36.0)
MCV: 98.6 fL (ref 80.0–100.0)
Platelets: 193 10*3/uL (ref 150–400)
RBC: 3.54 MIL/uL — ABNORMAL LOW (ref 3.87–5.11)
RDW: 12.7 % (ref 11.5–15.5)
WBC: 6.9 10*3/uL (ref 4.0–10.5)
nRBC: 0 % (ref 0.0–0.2)

## 2022-08-27 LAB — LIPASE, BLOOD: Lipase: 27 U/L (ref 11–51)

## 2022-08-27 MED ORDER — DICYCLOMINE HCL 20 MG PO TABS
10.0000 mg | ORAL_TABLET | Freq: Every day | ORAL | 0 refills | Status: DC | PRN
  Filled 2022-08-27: qty 5, 10d supply, fill #0

## 2022-08-27 MED ORDER — SODIUM CHLORIDE 0.9 % IV BOLUS
1000.0000 mL | Freq: Once | INTRAVENOUS | Status: AC
  Administered 2022-08-27: 1000 mL via INTRAVENOUS

## 2022-08-27 MED ORDER — DICYCLOMINE HCL 10 MG PO CAPS
10.0000 mg | ORAL_CAPSULE | Freq: Once | ORAL | Status: AC
  Administered 2022-08-27: 10 mg via ORAL
  Filled 2022-08-27: qty 1

## 2022-08-27 MED ORDER — FENTANYL CITRATE PF 50 MCG/ML IJ SOSY
50.0000 ug | PREFILLED_SYRINGE | Freq: Once | INTRAMUSCULAR | Status: AC
  Administered 2022-08-27: 50 ug via INTRAVENOUS
  Filled 2022-08-27: qty 1

## 2022-08-27 MED ORDER — IOHEXOL 300 MG/ML  SOLN
80.0000 mL | Freq: Once | INTRAMUSCULAR | Status: AC | PRN
  Administered 2022-08-27: 80 mL via INTRAVENOUS

## 2022-08-27 NOTE — Discharge Instructions (Addendum)
Urinalysis negative for infection.  Recommend that you titrate MiraLAX a little bit more aggressively.  Can use 3-4 capfuls in 20 to 30 ounces of water tomorrow to see if he can get better relief.  I have prescribed you a medicine called Bentyl to help withBowel spasms.  Follow-up with your primary care doctor.

## 2022-08-27 NOTE — ED Provider Notes (Signed)
[place] [facility] Provider Note   CSN: [id number] Arrival date & time: 08/27/22  2043     History  Chief Complaint  Patient presents with   Abdominal Pain    Deanna Rowe is a 67 y.o. female.  The history is provided by the patient.  Abdominal Pain Pain location:  LLQ Pain quality: aching   Pain radiates to:  Does not radiate Pain severity:  Mild Onset quality:  Gradual Duration:  5 hours Timing:  Constant Progression:  Unchanged Chronicity:  New Context: not previous surgeries   Relieved by:  Nothing Worsened by:  Nothing Associated symptoms: constipation   Associated symptoms: no anorexia, no belching, no chills, no cough, no diarrhea, no dysuria, no fatigue, no fever, no flatus, no hematemesis, no hematochezia, no hematuria, no melena, no nausea, no shortness of breath, no sore throat, no vaginal bleeding, no vaginal discharge and no vomiting   Risk factors: has not had multiple surgeries        Home Medications Prior to Admission medications   Medication Sig Start Date End Date Taking? Authorizing Provider  dicyclomine (BENTYL) 20 MG tablet Take 0.5 tablets (10 mg total) by mouth daily as needed for spasms. 08/27/22  Yes Emry Barbato, DO  ACCU-CHEK FASTCLIX LANCETS MISC USE TO CHECK BLOOD SUGAR 3 TIMES A DAY 02/27/18   Romero Belling, MD  ACCU-CHEK GUIDE test strip USE TO CHECK BLOOD SUGAR 3 TIMES PER DAY. Patient taking differently: Use to check blood sugar 4 times per day. 02/02/17   Romero Belling, MD  albuterol (VENTOLIN HFA) 108 (90 Base) MCG/ACT inhaler Inhale 2 puffs into the lungs as needed for wheezing or shortness of breath. 07/06/20   Hetty Blend, FNP  amLODipine (NORVASC) 5 MG tablet Take 1 tablet (5 mg total) by mouth daily. 05/12/22     aspirin EC 81 MG tablet Take 81 mg by mouth every evening.    [provider]  atorvastatin (LIPITOR) 10 MG tablet TAKE 1 TABLET BY MOUTH AT BEDTIME 04/22/22      azelastine (ASTELIN) 0.1 % nasal spray Place 2 sprays into both nostrils 2 (two) times daily as needed. 11/25/21   Nehemiah Settle, FNP  brexpiprazole (REXULTI) 2 MG TABS tablet Take 1 tablet (2 mg total) by mouth in the morning. 10/13/21     buPROPion (WELLBUTRIN XL) 150 MG 24 hr tablet Take 3 tablets (450 mg total) by mouth in the morning. 03/24/22   Marcellina Millin, MD  Calcium Carbonate-Vitamin D 600-400 MG-UNIT tablet Take 1 tablet by mouth daily.    [provider]  carbidopa-levodopa (SINEMET CR) 50-200 MG tablet Take 1 tablet by mouth 5 (five) times daily. 08/15/22 08/15/23  Huston Foley, MD  carbidopa-levodopa (SINEMET) 25-250 MG tablet Take 1 tablet by mouth every morning AND 1 tablet daily in the afternoon at 2 pm. 08/24/22   Huston Foley, MD  Continuous Blood Gluc Receiver (DEXCOM G7 RECEIVER) DEVI Use as directed to check blood sugar 11/25/21     Continuous Blood Gluc Sensor (DEXCOM G7 SENSOR) MISC Use to check blood sugar and change sensor every 10 days 11/25/21     COVID-19 mRNA vaccine 2023-2024 (COMIRNATY) syringe Inject into the muscle. 01/18/22   Judyann Munson, MD  dapagliflozin propanediol (FARXIGA) 5 MG TABS tablet Take 1 tablet (5 mg total) by mouth daily. 05/27/22     EPINEPHrine (EPIPEN 2-PAK) 0.3 mg/0.3 mL IJ SOAJ injection Inject 0.3 mg into the muscle as  needed for anaphylaxis. 11/11/21   Nehemiah Settle, FNP  gabapentin (NEURONTIN) 300 MG capsule Take 1 capsule (300 mg total) by mouth 3 (three) times daily. 03/02/22     glucose blood test strip Use to check blood sugar 4 times daily 09/30/21     HYDROcodone-acetaminophen (NORCO/VICODIN) 5-325 MG tablet Take 1 tablet by mouth every 6 (six) hours as needed for 10 days. 02/21/22     insulin degludec (TRESIBA FLEXTOUCH) 100 UNIT/ML FlexTouch Pen INJECT UNDER THE SKIN ONCE DAILY. TRITRATE AS DIRECTED WITH A MAXIMUM DAILY DOSE OF 50 UNITS    [provider]  insulin lispro (HUMALOG) 100 UNIT/ML injection SQ -  14 units at breakfast, 12 units at lunch, 16 units at supper 11/05/19   Whitmire, Dawn W, FNP  Insulin Pen Needle (TECHLITE PEN NEEDLES) 32G X 6 MM MISC use to Inject insulin into the skin 4 (four) times daily. 12/14/21   Talmage Coin, MD  levocetirizine (XYZAL) 5 MG tablet Take 1 tablet (5 mg total) by mouth daily as needed. 07/06/20 11/11/21  Hetty Blend, FNP  LORazepam (ATIVAN) 2 MG tablet Take 1 tablet (2 mg total) by mouth in the morning, at noon, and at bedtime. 03/24/22     losartan (COZAAR) 100 MG tablet Take 1 tablet (100 mg total) by mouth daily. 12/24/21     meloxicam (MOBIC) 15 MG tablet Take 1 tablet (15 mg total) by mouth daily. 03/14/22   Felecia Shelling, DPM  mirabegron ER (MYRBETRIQ) 50 MG TB24 tablet Take 1 tablet by mouth everyday 10/21/21     Olopatadine HCl (PATADAY) 0.2 % SOLN Place 1 drop into both eyes daily as needed. 07/06/20   Hetty Blend, FNP  omalizumab Geoffry Paradise) 150 MG/ML prefilled syringe Inject 300 mg into the skin every 28 (twenty-eight) days. 07/26/22   Quentin Angst, MD  propranolol (INDERAL) 40 MG tablet Take 1 tablet (40 mg total) by mouth 2 (two) times daily. 05/30/22   Corky Crafts, MD  Prucalopride Succinate 2 MG TABS Take 1 tablet (2 mg total) by mouth daily. 07/21/22     rOPINIRole (REQUIP) 2 MG tablet Take 1 tablet (2 mg total) by mouth in the morning and at bedtime. 03/24/22   Marcellina Millin, MD  rOPINIRole (REQUIP) 2 MG tablet Take 1 tablet (2 mg total) by mouth 2 (two) times daily. 07/07/22   Marcellina Millin, MD  solifenacin (VESICARE) 10 MG tablet TAKE 1 TABLET BY MOUTH ONCE DAILY 06/05/19 06/04/20  Rene Paci, MD  solifenacin (VESICARE) 10 MG tablet Take 1 tablet (10 mg total) by mouth daily. 05/30/22 05/30/23    tirzepatide (MOUNJARO) 15 MG/0.5ML Pen Inject 15 mg into the skin once a week. 08/04/22   Quillian Quince D, MD  triamcinolone (NASACORT) 55 MCG/ACT AERO nasal inhaler Place 2 sprays into the nose daily. 07/06/20   Hetty Blend, FNP  valACYclovir (VALTREX) 500 MG tablet Take 1 tablet by mouth everyday 07/06/21     vitamin C (ASCORBIC ACID) 500 MG tablet Take 500 mg by mouth daily.    [provider]  Vitamin D, Ergocalciferol, 2000 units CAPS Take 1 capsule by mouth daily. 2000 units daily.    [provider]  vortioxetine HBr (TRINTELLIX) 20 MG TABS tablet Take 1 tablet (20 mg total) by mouth in the morning. 03/24/22     zolpidem (AMBIEN) 5 MG tablet Take 1 tablet (5 mg total) by mouth at bedtime. 03/24/22   Ellamae Sia  Gerda Diss, MD  Zoster Vaccine Adjuvanted [facility]) injection Inject into the muscle. 05/04/22   Judyann Munson, MD  omeprazole (PRILOSEC) 20 MG capsule Take 1 capsule (20 mg total) by mouth daily 30 minutes prior to dinner 05/19/22 05/31/22  Nehemiah Settle, FNP      Allergies    Fluticasone-salmeterol, Codeine, Fetzima [levomilnacipran], Nsaids, Trulicity [dulaglutide], Xanax [alprazolam], Amoxicillin, and Pseudoephedrine hcl er    Review of Systems   Review of Systems  Constitutional:  Negative for chills, fatigue and fever.  HENT:  Negative for sore throat.   Respiratory:  Negative for cough and shortness of breath.   Gastrointestinal:  Positive for abdominal pain and constipation. Negative for anorexia, diarrhea, flatus, hematemesis, hematochezia, melena, nausea and vomiting.  Genitourinary:  Negative for dysuria, hematuria, vaginal bleeding and vaginal discharge.    Physical Exam Updated Vital Signs BP (!) 164/95   Pulse 69   Temp 98.4 F (36.9 C) (Oral)   Resp 18   SpO2 100%  Physical Exam Vitals and nursing note reviewed.  Constitutional:      General: She is not in acute distress.    Appearance: She is well-developed.  HENT:     Head: Normocephalic and atraumatic.  Eyes:     Conjunctiva/sclera: Conjunctivae normal.  Cardiovascular:     Rate and Rhythm: Normal rate and regular rhythm.     Heart sounds: No murmur heard. Pulmonary:     Effort: Pulmonary  effort is normal. No respiratory distress.     Breath sounds: Normal breath sounds.  Abdominal:     Palpations: Abdomen is soft.     Tenderness: There is abdominal tenderness in the left lower quadrant.  Musculoskeletal:        General: No swelling.     Cervical back: Neck supple.  Skin:    General: Skin is warm and dry.     Capillary Refill: Capillary refill takes less than 2 seconds.  Neurological:     Mental Status: She is alert.  Psychiatric:        Mood and Affect: Mood normal.     ED Results / Procedures / Treatments   Labs (all labs ordered are listed, but only abnormal results are displayed) Labs Reviewed  COMPREHENSIVE METABOLIC PANEL - Abnormal; Notable for the following components:      Result Value   Sodium 128 (*)    Chloride 96 (*)    Creatinine, Ser 1.26 (*)    Calcium 8.7 (*)    GFR, Estimated 47 (*)    All other components within normal limits  CBC - Abnormal; Notable for the following components:   RBC 3.54 (*)    Hemoglobin 11.9 (*)    HCT 34.9 (*)    All other components within normal limits  URINALYSIS, ROUTINE W REFLEX MICROSCOPIC - Abnormal; Notable for the following components:   Glucose, UA 100 (*)    All other components within normal limits  LIPASE, BLOOD    EKG None  Radiology CT ABDOMEN PELVIS W CONTRAST  Result Date: 08/27/2022 CLINICAL DATA:  Abdominal pain. EXAM: CT ABDOMEN AND PELVIS WITH CONTRAST TECHNIQUE: Multidetector CT imaging of the abdomen and pelvis was performed using the standard protocol following bolus administration of intravenous contrast. RADIATION DOSE REDUCTION: This exam was performed according to the departmental dose-optimization program which includes automated exposure control, adjustment of the mA and/or kV according to patient size and/or use of iterative reconstruction technique. CONTRAST:  80mL OMNIPAQUE IOHEXOL 300 MG/ML  SOLN COMPARISON:  Aug 27, 2020 FINDINGS: Lower chest: No acute abnormality.  Hepatobiliary: No focal liver abnormality is seen. No gallstones, gallbladder wall thickening, or biliary dilatation. Pancreas: Unremarkable. No pancreatic ductal dilatation or surrounding inflammatory changes. Spleen: Normal in size without focal abnormality. Adrenals/Urinary Tract: Adrenal glands are unremarkable. Kidneys are normal in size, without renal calculi or hydronephrosis. A subcentimeter simple cyst is seen within the mid right kidney. The urinary bladder is markedly distended and is otherwise unremarkable. Stomach/Bowel: Stomach is within normal limits. Appendix appears normal. Stool is seen throughout the large bowel. No evidence of bowel wall thickening, distention, or inflammatory changes. Noninflamed diverticula within the descending colon. Vascular/Lymphatic: Aortic atherosclerosis. No enlarged abdominal or pelvic lymph nodes. Reproductive: Uterus and bilateral adnexa are unremarkable. Other: No abdominal wall hernia or abnormality. No abdominopelvic ascites. Musculoskeletal: Bilateral total hip replacements are seen with associated streak artifact and subsequently limited evaluation of the adjacent osseous and soft tissue structures. IMPRESSION: 1. Colonic diverticulosis. 2. 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 3. Aortic atherosclerosis. Aortic Atherosclerosis (ICD10-I70.0). Electronically Signed   By: Aram Candela M.D.   On: 08/27/2022 22:41    Procedures Procedures    Medications Ordered in ED Medications  fentaNYL (SUBLIMAZE) injection 50 mcg (50 mcg Intravenous Given 08/27/22 2201)  sodium chloride 0.9 % bolus 1,000 mL (1,000 mLs Intravenous New Bag/Given 08/27/22 2203)  iohexol (OMNIPAQUE) 300 MG/ML solution 80 mL (80 mLs Intravenous Contrast Given 08/27/22 2217)  dicyclomine (BENTYL) capsule 10 mg (10 mg Oral Given 08/27/22 2252)    ED Course/ Medical Decision Making/ A&P                             Medical Decision Making Amount and/or Complexity of Data  Reviewed Labs: ordered. Radiology: ordered.  Risk Prescription drug management.   Timeka P Ahlberg is here with abdominal pain.  Normal vitals.  History of diabetes, hypertension, anxiety, depression.  Differential diagnosis diverticulitis versus less likely bowel obstruction, could be kidney stone or UTI or gas related/constipation.  Normal vitals except for mild hypertension.  Will get CBC, CMP, lipase, urinalysis, CT scan abdomen pelvis.  Will give IV fluids, IV fentanyl and reevaluate.  Per my review and interpretation of labs there is no significant anemia or electrolyte abnormality or kidney injury.  Urinalysis negative for infection.  CT scan with no acute findings.  She is feeling much better.  I do think she is having some constipation.  Will prescribe low-dose of Bentyl to help with some bowel spasms.  Will have her titrate her MiraLAX.  Will have her follow-up with primary care doctor.  Discharged in good condition.  This chart was dictated using voice recognition software.  Despite best efforts to proofread,  errors can occur which can change the documentation meaning.         Final Clinical Impression(s) / ED Diagnoses Final diagnoses:  Generalized abdominal pain    Rx / DC Orders ED Discharge Orders          Ordered    dicyclomine (BENTYL) 20 MG tablet  Daily PRN        08/27/22 2254              Virgina Norfolk, DO 08/27/22 2255

## 2022-08-27 NOTE — ED Triage Notes (Addendum)
Patient here with abdominal pain and into her left side.  No nausea or vomiting at this time.  She states that the abdominal pain started earlier today in the afternoon.

## 2022-08-28 ENCOUNTER — Telehealth (HOSPITAL_BASED_OUTPATIENT_CLINIC_OR_DEPARTMENT_OTHER): Payer: Self-pay | Admitting: Emergency Medicine

## 2022-08-28 MED ORDER — DICYCLOMINE HCL 20 MG PO TABS: 10.0000 mg | ORAL_TABLET | Freq: Every day | ORAL | 0 refills | Status: DC | PRN

## 2022-08-28 NOTE — Telephone Encounter (Signed)
Sent rx to [facility]

## 2022-08-29 ENCOUNTER — Other Ambulatory Visit: Payer: Self-pay

## 2022-08-30 ENCOUNTER — Encounter: Payer: Self-pay | Admitting: Neurology

## 2022-08-30 ENCOUNTER — Other Ambulatory Visit (HOSPITAL_BASED_OUTPATIENT_CLINIC_OR_DEPARTMENT_OTHER): Payer: Self-pay

## 2022-08-30 ENCOUNTER — Ambulatory Visit (INDEPENDENT_AMBULATORY_CARE_PROVIDER_SITE_OTHER): Payer: Commercial Managed Care - PPO | Admitting: Neurology

## 2022-08-30 VITALS — BP 141/83 | HR 71 | Ht 66.0 in | Wt 215.6 lb

## 2022-08-30 DIAGNOSIS — R296 Repeated falls: Secondary | ICD-10-CM

## 2022-08-30 DIAGNOSIS — G20C Parkinsonism, unspecified: Secondary | ICD-10-CM | POA: Diagnosis not present

## 2022-08-30 MED ORDER — CARBIDOPA-LEVODOPA ER 50-200 MG PO TBCR
1.0000 | EXTENDED_RELEASE_TABLET | Freq: Every day | ORAL | 3 refills | Status: DC
  Filled 2022-08-30: qty 450, 90d supply, fill #0
  Filled 2022-10-18 – 2022-10-20 (×7): qty 150, 30d supply, fill #0
  Filled 2022-11-16 – 2022-11-21 (×3): qty 150, 30d supply, fill #1
  Filled 2022-11-21: qty 50, 10d supply, fill #1
  Filled 2022-11-21: qty 100, 20d supply, fill #1
  Filled 2023-01-09: qty 150, 30d supply, fill #2
  Filled 2023-02-13: qty 150, 30d supply, fill #3
  Filled 2023-03-13: qty 150, 30d supply, fill #4
  Filled 2023-05-02: qty 150, 30d supply, fill #5
  Filled 2023-05-13 – 2023-06-05 (×2): qty 150, 30d supply, fill #6
  Filled 2023-06-30: qty 150, 30d supply, fill #7
  Filled 2023-08-09: qty 150, 30d supply, fill #8

## 2022-08-30 NOTE — Patient Instructions (Signed)
We will continue with Sinemet CR at 1 pill 5 times a day and Sinemet IR 1 pill twice daily as well as ropinirole twice daily.  I recommend you use your walker at all times.  Follow up with Butch Penny in 6 months.  Follow up with Dr. Vickey Huger for your sleep apnea.

## 2022-08-30 NOTE — Progress Notes (Signed)
Subjective:    Patient ID: Deanna Rowe is a 67 y.o. female.  HPI    Interim history:   Deanna Rowe is a 67 year old right-handed woman with an underlying medical history of asthma, arthritis, sleep apnea, reflux disease, hypertension, hyperlipidemia, avascular necrosis of the hip, back pain, history of chest pain, depression, diabetes, restless leg syndrome, vitamin D deficiency, parkinsonism, and obesity, who presents for follow-up consultation of Deanna Rowe parkinsonism. The patient is accompanied by Deanna Rowe husband today. I last saw Deanna Rowe on 04/05/2022, at which time she reported feeling better, she had not had any escalation of hallucinations.  She was advised to continue with Deanna Rowe medications but avoid narcotic pain medication.  We proceeded with blood work and an outpatient EEG.  She had an EEG on 04/07/2022 which showed normal findings in the awake and drowsy states.  Blood test results showed low normal vitamin B12, mildly elevated potassium, normal thyroid function, mild kidney impairment.  Today, 08/30/22: She reports feeling stable, she does not sleep well however.  She is on Ambien per psychiatry.  She also takes gabapentin 3 times a day and lorazepam 3 times a day.  Deanna Rowe husband reports that she has been taking the Sinemet CR 4 times a day, they were under the impression that it would be okay to scale back but patient would like to go back to 1 pill 5 times a day, I explained to them that we did not discuss cutting back on the Sinemet CR at the last visit.  She continues to take Sinemet IR 25-250 mg strength 1 pill twice daily and ropinirole 2 mg twice daily. She has fallen in the recent past, she has not injured herself, she has a walker but does not consistently use Deanna Rowe walker.  She hydrates well with water.  She does not use Deanna Rowe Pap machine any longer.  She is willing to make a follow-up appointment with Dr. Vickey Huger.  She had a follow-up appointment with ophthalmology and has prescription eyeglasses.   She reports that she was told she had mild retinopathy.   The patient's allergies, current medications, family history, past medical history, past social history, past surgical history and problem list were reviewed and updated as appropriate.    Previously:    I saw Deanna Rowe on 08/05/2021, at which time she reported feeling stable.  She was on Sinemet CR 1 pill 5 times a day and ropinirole 2 mg strength twice daily as well as Sinemet IR 1 pill twice daily. She was advised to continue with Deanna Rowe medication regimen.   She saw Butch Penny, NP on 12/02/21, at which time the patient reported 5 falls within the past 2 or 3 weeks.  She was not using Deanna Rowe CPAP machine as she could not tolerate it.  She was advised to use Deanna Rowe cane at all times.  She was referred to physical therapy.     I first met Deanna Rowe on 01/29/2021, at which time she was advised to continue with Sinemet CR 1 pill 5 times a day and Sinemet IR 1 pill once daily or up to twice daily. She was previously followed by Dr. Stephanie Acre.  She saw Butch Penny, NP in the interim on 06/07/2021 for sleep apnea follow-up.  She reported that she was not able to use Deanna Rowe PAP machine consistently due to chronic cough.  She was advised to restart CPAP therapy.  She was advised to continue with Sinemet CR 1 pill 5 times a day and Sinemet IR twice  daily.  She was advised to use Deanna Rowe cane at all times.     01/29/21: 67 year old right-handed woman with an underlying complex medical history of asthma, arthritis, anemia, avascular necrosis of the hip, depression, anxiety, type 2 diabetes, reflux disease, hyperlipidemia, hypertension, kidney disease, restless leg syndrome, history of TIA, thyroid disease, vitamin D deficiency, and obesity, who was diagnosed with parkinsonism several years ago.  She was noted to have right-sided signs.  She was on Abilify in the past.  She is no longer on Abilify for the past 2 years, but she is on multiple psychotropic medications  including lorazepam, bupropion, Rexulti, Trintellix, and also takes gabapentin as well as zolpidem.  She has clearly stopped the zolpidem about 3 weeks ago and is currently on melatonin 5 mg each night which has been helpful.  She is on ropinirole for restless leg syndrome per primary care, currently on 2 mg twice daily.  For parkinsonism she is on Sinemet CR, 50-200 mg strength 1 pill 5 times a day.  She last saw Dr. Anne Hahn on 08/26/2020, at which time she was advised to add Sinemet IR 25-250 mg strength, 1 pill twice daily, in the morning and at 2 PM daily.     She has a history of urinary incontinence and is currently on Myrbetriq and Vesicare per urology.  She has chronic constipation for at least 1 year and has been on MiraLAX daily, and 2 stool softeners daily. She has been using a cane.  Since the last appointment, she has not fallen thankfully.  She believes that the addition of Sinemet IR has helped but she only takes 1 in the morning.   She had a brain MRI without contrast on 03/15/2017 and I reviewed the results:   IMPRESSION: Slightly abnormal MRI scan of the brain showing prominent changes of chronic microvascular ischemia.  No acute abnormalities noted.  Overall no significant change compared with prior MRI scan dated 05/23/2014.   For Deanna Rowe sleep apnea, she is followed by Dr. Vickey Huger and the nurse practitioner.  She reports 50% compliance with Deanna Rowe CPAP.  Sometimes acid reflux bothers Deanna Rowe at night which bothers Deanna Rowe CPAP usage.   She is followed by [name] in psychiatry.   08/26/20 (Dr. Anne Hahn): << Ms. Scally is a 67 year old right-handed black female with a history of Parkinson's disease, depression, diabetes, and sleep apnea.  The patient is currently in physical therapy twice weekly.  She has not had any falls in about 2 weeks.  She claims that she has gained improvement with taking the Sinemet CR more frequently, but she still has problems with wearing off around 2 PM.  The patient may  freeze up and fall at times.  The patient may use a cane or walker for ambulation.  She is tolerating medications fairly well, she denies any excessive daytime drowsiness currently.  She returns to the office today for further evaluation.>>      Deanna Rowe Past Medical History Is Significant For: Past Medical History:  Diagnosis Date   Anemia    Anxiety    Arthritis    L HIP   Asthma    Avascular necrosis of hip (HCC)    LEFT   Back pain    Chest pain    Constipation    Depression    Diabetes mellitus    Diabetes mellitus, type II (HCC)    Dry cough    Edema, lower extremity    Gait abnormality 11/18/2019   GERD (gastroesophageal reflux  disease)    High cholesterol    Hypertension    Insomnia    takes Ambien nightly   Joint pain    Kidney disease    Kidney disease    Obesity    Parkinsonism 03/02/2017   PONV (postoperative nausea and vomiting)    RLS (restless legs syndrome) 01/15/2018   Sleep apnea    Swallowing difficulty    Thyroid disease    TIA (transient ischemic attack) 2012   no residual problems   Urgency incontinence    Vitamin D deficiency     Deanna Rowe Past Surgical History Is Significant For: Past Surgical History:  Procedure Laterality Date   DILATION AND CURETTAGE OF UTERUS     ESOPHAGEAL MANOMETRY N/A 09/03/2012   Procedure: ESOPHAGEAL MANOMETRY (EM);  Surgeon: Charolett Bumpers, MD;  Location: [facility];  Service: Endoscopy;  Laterality: N/A;   ESOPHAGEAL MANOMETRY N/A 06/19/2017   Procedure: ESOPHAGEAL MANOMETRY (EM);  Surgeon: Vida Rigger, MD;  Location: [facility];  Service: Endoscopy;  Laterality: N/A;   EXPLORATORY LAPAROTOMY     GANGLION CYST EXCISION     JOINT REPLACEMENT  2011   rt total hip   KNEE ARTHROSCOPY     PILONIDAL CYST / SINUS EXCISION     TOTAL HIP ARTHROPLASTY Left 10/09/2013   Procedure: LEFT TOTAL HIP ARTHROPLASTY ANTERIOR APPROACH;  Surgeon: Loanne Drilling, MD;  Location: [facility];  Service: Orthopedics;  Laterality: Left;    Deanna Rowe  Family History Is Significant For: Family History  Problem Relation Age of Onset   High blood pressure Mother    Hyperlipidemia Mother    Heart disease Mother    Heart disease Father    Alcoholism Father    Allergic rhinitis Sister    Alcohol abuse Brother    Alcohol abuse Brother    Alcohol abuse Brother    Alcohol abuse Brother    Alzheimer's disease Maternal Aunt    Diabetes Neg Hx    Angioedema Neg Hx    Asthma Neg Hx    Eczema Neg Hx    Immunodeficiency Neg Hx    Urticaria Neg Hx    Breast cancer Neg Hx    Parkinson's disease Neg Hx    Sleep apnea Neg Hx     Deanna Rowe Social History Is Significant For: Social History   Socioeconomic History   Marital status: Married    Spouse name: Maurine Minister   Number of children: Not on file   Years of education: Not on file   Highest education level: Not on file  Occupational History   Occupation: retired [employer] fundraiser  Tobacco Use   Smoking status: Never   Smokeless tobacco: Never  Vaping Use   Vaping Use: Never used  Substance and Sexual Activity   Alcohol use: No    Alcohol/week: 0.0 standard drinks of alcohol   Drug use: No   Sexual activity: Not Currently  Other Topics Concern   Not on file  Social History Narrative   Lives with husband   Caffeine use: none   Right handed    Social Determinants of Health   Financial Resource Strain: Not on file  Food Insecurity: Not on file  Transportation Needs: Not on file  Physical Activity: Not on file  Stress: Not on file  Social Connections: Not on file    Deanna Rowe Allergies Are:  Allergies  Allergen Reactions   Fluticasone-Salmeterol Anaphylaxis   Codeine Nausea And Vomiting   Fetzima [Levomilnacipran] Nausea And Vomiting  Nsaids Other (See Comments)    Upset stomach    Trulicity [Dulaglutide] Nausea And Vomiting    Significant and undesirably rapid (40 lbs) weight loss    Xanax [Alprazolam] Other (See Comments)    disoriented   Amoxicillin Rash    Has patient had a PCN reaction  causing immediate rash, facial/tongue/throat swelling, SOB or lightheadedness with hypotension: Yes Has patient had a PCN reaction causing severe rash involving mucus membranes or skin necrosis: No Has patient had a PCN reaction that required hospitalization: No Has patient had a PCN reaction occurring within the last 10 years: No If all of the above answers are "NO", then may proceed with Cephalosporin use.    Pseudoephedrine Hcl Er Palpitations  :   Deanna Rowe Current Medications Are:  Outpatient Encounter Medications as of 08/30/2022  Medication Sig   ACCU-CHEK FASTCLIX LANCETS MISC USE TO CHECK BLOOD SUGAR 3 TIMES A DAY   ACCU-CHEK GUIDE test strip USE TO CHECK BLOOD SUGAR 3 TIMES PER DAY. (Patient taking differently: Use to check blood sugar 4 times per day.)   amLODipine (NORVASC) 5 MG tablet Take 1 tablet (5 mg total) by mouth daily.   aspirin EC 81 MG tablet Take 81 mg by mouth every evening.   atorvastatin (LIPITOR) 10 MG tablet TAKE 1 TABLET BY MOUTH AT BEDTIME   azelastine (ASTELIN) 0.1 % nasal spray Place 2 sprays into both nostrils 2 (two) times daily as needed.   brexpiprazole (REXULTI) 2 MG TABS tablet Take 1 tablet (2 mg total) by mouth in the morning.   buPROPion (WELLBUTRIN XL) 150 MG 24 hr tablet Take 3 tablets (450 mg total) by mouth in the morning.   Calcium Carbonate-Vitamin D 600-400 MG-UNIT tablet Take 1 tablet by mouth daily.   carbidopa-levodopa (SINEMET CR) 50-200 MG tablet Take 1 tablet by mouth 5 (five) times daily.   carbidopa-levodopa (SINEMET) 25-250 MG tablet Take 1 tablet by mouth every morning AND 1 tablet daily in the afternoon at 2 pm.   Continuous Blood Gluc Receiver (DEXCOM G7 RECEIVER) DEVI Use as directed to check blood sugar   Continuous Blood Gluc Sensor (DEXCOM G7 SENSOR) MISC Use to check blood sugar and change sensor every 10 days   dapagliflozin propanediol (FARXIGA) 5 MG TABS tablet Take 1 tablet (5 mg total) by mouth daily.   dicyclomine (BENTYL) 20  MG tablet Take 0.5 tablets (10 mg total) by mouth daily as needed for spasms.   EPINEPHrine (EPIPEN 2-PAK) 0.3 mg/0.3 mL IJ SOAJ injection Inject 0.3 mg into the muscle as needed for anaphylaxis.   gabapentin (NEURONTIN) 300 MG capsule Take 1 capsule (300 mg total) by mouth 3 (three) times daily.   glucose blood test strip Use to check blood sugar 4 times daily   HYDROcodone-acetaminophen (NORCO/VICODIN) 5-325 MG tablet Take 1 tablet by mouth every 6 (six) hours as needed for 10 days.   insulin degludec (TRESIBA FLEXTOUCH) 100 UNIT/ML FlexTouch Pen INJECT UNDER THE SKIN ONCE DAILY. TRITRATE AS DIRECTED WITH A MAXIMUM DAILY DOSE OF 50 UNITS   insulin lispro (HUMALOG) 100 UNIT/ML injection SQ - 14 units at breakfast, 12 units at lunch, 16 units at supper   Insulin Pen Needle (TECHLITE PEN NEEDLES) 32G X 6 MM MISC use to Inject insulin into the skin 4 (four) times daily.   LORazepam (ATIVAN) 2 MG tablet Take 1 tablet (2 mg total) by mouth in the morning, at noon, and at bedtime.   losartan (COZAAR) 100 MG tablet Take  1 tablet (100 mg total) by mouth daily.   meloxicam (MOBIC) 15 MG tablet Take 1 tablet (15 mg total) by mouth daily.   mirabegron ER (MYRBETRIQ) 50 MG TB24 tablet Take 1 tablet by mouth everyday   Olopatadine HCl (PATADAY) 0.2 % SOLN Place 1 drop into both eyes daily as needed.   omalizumab Geoffry Paradise) 150 MG/ML prefilled syringe Inject 300 mg into the skin every 28 (twenty-eight) days.   propranolol (INDERAL) 40 MG tablet Take 1 tablet (40 mg total) by mouth 2 (two) times daily.   Prucalopride Succinate 2 MG TABS Take 1 tablet (2 mg total) by mouth daily.   rOPINIRole (REQUIP) 2 MG tablet Take 1 tablet (2 mg total) by mouth 2 (two) times daily.   solifenacin (VESICARE) 10 MG tablet Take 1 tablet (10 mg total) by mouth daily.   tirzepatide (MOUNJARO) 15 MG/0.5ML Pen Inject 15 mg into the skin once a week.   triamcinolone (NASACORT) 55 MCG/ACT AERO nasal inhaler Place 2 sprays into the nose  daily.   valACYclovir (VALTREX) 500 MG tablet Take 1 tablet by mouth everyday   vitamin C (ASCORBIC ACID) 500 MG tablet Take 500 mg by mouth daily.   Vitamin D, Ergocalciferol, 2000 units CAPS Take 1 capsule by mouth daily. 2000 units daily.   vortioxetine HBr (TRINTELLIX) 20 MG TABS tablet Take 1 tablet (20 mg total) by mouth in the morning.   zolpidem (AMBIEN) 5 MG tablet Take 1 tablet (5 mg total) by mouth at bedtime.   [DISCONTINUED] COVID-19 mRNA vaccine 2023-2024 (COMIRNATY) syringe Inject into the muscle.   [DISCONTINUED] rOPINIRole (REQUIP) 2 MG tablet Take 1 tablet (2 mg total) by mouth in the morning and at bedtime.   [DISCONTINUED] Zoster Vaccine Adjuvanted [facility]) injection Inject into the muscle.   albuterol (VENTOLIN HFA) 108 (90 Base) MCG/ACT inhaler Inhale 2 puffs into the lungs as needed for wheezing or shortness of breath. (Patient not taking: Reported on 08/30/2022)   levocetirizine (XYZAL) 5 MG tablet Take 1 tablet (5 mg total) by mouth daily as needed.   [DISCONTINUED] omeprazole (PRILOSEC) 20 MG capsule Take 1 capsule (20 mg total) by mouth daily 30 minutes prior to dinner   [DISCONTINUED] solifenacin (VESICARE) 10 MG tablet TAKE 1 TABLET BY MOUTH ONCE DAILY   Facility-Administered Encounter Medications as of 08/30/2022  Medication   tranexamic acid (CYKLOKAPRON) topical -INTRAOP  :  Review of Systems:  Out of a complete 14 point review of systems, all are reviewed and negative with the exception of these symptoms as listed below: Review of Systems  Neurological:        Has had 5-6 falls from last visit.  Just scrapes and bruises.  Has finished PT, will be going to [facility]. Taking Sinemet CR 4 times daily (was told could take it that way last time was here).  Ambien not working.    Objective:  Neurological Exam  Physical Exam Physical Examination:   Vitals:   08/30/22 1041  BP: (!) 141/83  Pulse: 71   General Examination: The patient is a very  pleasant 67 y.o. female in no acute distress. She appears well-developed and well-nourished and well groomed.   HEENT: Normocephalic, atraumatic, pupils are equal, round and reactive to light, corrective eyeglasses in place, bilateral cataracts.  Extraocular tracking mildly impaired, stable.  Overall mild nuchal rigidity noted, mild facial masking, mild hypophonia, intermittent mild lower lip and jaw tremor, stable.  No dysarthria.  Airway examination reveals moderate mouth dryness.  Hearing is grossly intact, bilateral hearing aids in place.     Chest: Clear to auscultation without wheezing, rhonchi or crackles noted.   Heart: S1+S2+0, regular and normal without murmurs, rubs or gallops noted.    Abdomen: Soft, non-tender and non-distended.  Normal bowel sounds.    Extremities: There is no pitting edema in the distal lower extremities bilaterally.    Skin: Warm and dry without trophic changes noted.    Musculoskeletal: exam reveals no obvious joint deformities.    Neurologically:  Mental status: The patient is awake, alert and oriented in all 4 spheres. Deanna Rowe immediate and remote memory, attention, language skills and fund of knowledge are fairly appropriate, she does not provide a whole lot of details to Deanna Rowe history, no active hallucinations.  Speech is as above. Thought process is linear. Mood is normal and affect is flat.    Cranial nerves II - XII are as described above under HEENT exam.  Motor exam: Normal bulk, strength and tone is noted. There is no resting or postural tremor currently.  Romberg is not tested for safety concerns. Moderate bradykinesia is noted.  Fine motor skills are globally mild to moderately impaired, no telltale lateralization noted, perhaps slightly worse on the right. Cebellar testing: No dysmetria or intention tremor. There is no truncal or gait ataxia.  Sensory exam: intact to light touch in the upper and lower extremities.  Gait, station and balance: She stands  slowly, posture is mildly stooped for age.  She walks slowly and cautiously, she can walk a little bit without Deanna Rowe cane.  She brought Deanna Rowe cane today, not Deanna Rowe rolling walker.     Assessment and Plan:    In summary, Deanna Rowe is a 67 year old female with an underlying complex medical history of asthma, arthritis, anemia, avascular necrosis of the hip, depression, anxiety, type 2 diabetes, reflux disease, hyperlipidemia, hypertension, kidney disease, restless leg syndrome, history of TIA, thyroid disease, vitamin D deficiency, OSA (not currently on PAP therapy), and obesity, who presents for follow-up consultation of Deanna Rowe parkinsonism, complicated by falls, Hx of confusion and hallucinations as well as sleep disturbance.  She is advised to get back on Sinemet CR 1 pill 5 times a day and continue with Sinemet IR 25-250 mg strength twice daily and ropinirole 2 mg twice daily.   She is advised to use Deanna Rowe walker at all times, as she has had recurrent falls and is at fall risk.   She is advised to make a follow-up appointment for sleep apnea with Dr. Vickey Huger.   She is advised to follow-up with Everlene Other, NP in 6 months routinely.  Of note, she does take a number of psychotropic medications unfortunately which complicates the situation. She had an EEG through our office in January 2024 which showed normal findings for awake and drowsy states.  We also did labs in January 2024. I answered all the questions today and the patient and Deanna Rowe husband were in agreement. I spent 30 minutes in total face-to-face time and in reviewing records during pre-charting, more than 50% of which was spent in counseling and coordination of care, reviewing test results, reviewing medications and treatment regimen and/or in discussing or reviewing the diagnosis of parkinsonism, recurrent falls, the prognosis and treatment options. Pertinent laboratory and imaging test results that were available during this visit with the  patient were reviewed by me and considered in my medical decision making (see chart for details).

## 2022-08-31 ENCOUNTER — Encounter: Payer: Self-pay | Admitting: Occupational Therapy

## 2022-08-31 ENCOUNTER — Ambulatory Visit: Payer: Commercial Managed Care - PPO | Admitting: Occupational Therapy

## 2022-08-31 ENCOUNTER — Ambulatory Visit: Payer: Commercial Managed Care - PPO | Attending: Adult Health | Admitting: Physical Therapy

## 2022-08-31 DIAGNOSIS — R29818 Other symptoms and signs involving the nervous system: Secondary | ICD-10-CM

## 2022-08-31 DIAGNOSIS — R2689 Other abnormalities of gait and mobility: Secondary | ICD-10-CM | POA: Diagnosis not present

## 2022-08-31 DIAGNOSIS — G20C Parkinsonism, unspecified: Secondary | ICD-10-CM | POA: Diagnosis not present

## 2022-08-31 DIAGNOSIS — R2681 Unsteadiness on feet: Secondary | ICD-10-CM | POA: Diagnosis not present

## 2022-08-31 DIAGNOSIS — R296 Repeated falls: Secondary | ICD-10-CM | POA: Diagnosis not present

## 2022-08-31 NOTE — Therapy (Signed)
OUTPATIENT PHYSICAL THERAPY NEURO EVALUATION   Patient Name: Deanna Rowe MRN: [id number] DOB:02-Mar-1956, 67 y.o., female Today's Date: 08/31/2022   PCP: Meridee Score, FNP Deboraha Sprang)  REFERRING PROVIDER: Huston Foley, MD  END OF SESSION:  PT End of Session - 08/31/22 1106     Visit Number 1    Number of Visits 1    Authorization Type [place] [employer]    PT Start Time 1103    PT Stop Time 1125   Eval   PT Time Calculation (min) 22 min    Activity Tolerance Patient tolerated treatment well    Behavior During Therapy WFL for tasks assessed/performed             Past Medical History:  Diagnosis Date   Anemia    Anxiety    Arthritis    L HIP   Asthma    Avascular necrosis of hip (HCC)    LEFT   Back pain    Chest pain    Constipation    Depression    Diabetes mellitus    Diabetes mellitus, type II (HCC)    Dry cough    Edema, lower extremity    Gait abnormality 11/18/2019   GERD (gastroesophageal reflux disease)    High cholesterol    Hypertension    Insomnia    takes Ambien nightly   Joint pain    Kidney disease    Kidney disease    Obesity    Parkinsonism 03/02/2017   PONV (postoperative nausea and vomiting)    RLS (restless legs syndrome) 01/15/2018   Sleep apnea    Swallowing difficulty    Thyroid disease    TIA (transient ischemic attack) 2012   no residual problems   Urgency incontinence    Vitamin D deficiency    Past Surgical History:  Procedure Laterality Date   DILATION AND CURETTAGE OF UTERUS     ESOPHAGEAL MANOMETRY N/A 09/03/2012   Procedure: ESOPHAGEAL MANOMETRY (EM);  Surgeon: Charolett Bumpers, MD;  Location: [facility];  Service: Endoscopy;  Laterality: N/A;   ESOPHAGEAL MANOMETRY N/A 06/19/2017   Procedure: ESOPHAGEAL MANOMETRY (EM);  Surgeon: Vida Rigger, MD;  Location: [facility];  Service: Endoscopy;  Laterality: N/A;   EXPLORATORY LAPAROTOMY     GANGLION CYST EXCISION     JOINT REPLACEMENT  2011   rt total hip   KNEE  ARTHROSCOPY     PILONIDAL CYST / SINUS EXCISION     TOTAL HIP ARTHROPLASTY Left 10/09/2013   Procedure: LEFT TOTAL HIP ARTHROPLASTY ANTERIOR APPROACH;  Surgeon: Loanne Drilling, MD;  Location: [facility];  Service: Orthopedics;  Laterality: Left;   Patient Active Problem List   Diagnosis Date Noted   Diabetes mellitus (HCC) 05/17/2022   Primary hypertension 05/17/2022   BMI 34.0-34.9,adult 05/17/2022   Obesity, Beginning BMI 39.44 05/17/2022   Allergic conjunctivitis of both eyes 07/06/2020   Gait abnormality 11/18/2019   OSA on CPAP 06/04/2019   Pain in joint of left shoulder 03/28/2019   Seasonal allergic conjunctivitis 02/07/2019   Chronic intermittent hypoxia with obstructive sleep apnea 01/25/2019   Severe obstructive sleep apnea 01/25/2019   Excessive daytime sleepiness 01/03/2019   Non-restorative sleep 01/03/2019   Nocturia more than twice per night 01/03/2019   Weight gain, abnormal 01/03/2019   Snorings 01/03/2019   Trochanteric bursitis of right hip 04/27/2018   Encounter for routine screening for malformation using ultrasonics 02/08/2018   Abnormal weight gain 02/08/2018   Acid indigestion 02/08/2018  Acute antritis 02/08/2018   Class 2 severe obesity with serious comorbidity and body mass index (BMI) of 39.0 to 39.9 in adult (HCC) 02/08/2018   Perennial allergic rhinitis 02/08/2018   Antiphospholipid syndrome (HCC) 02/08/2018   Arthralgia of hip or thigh 02/08/2018   Asthma, cough variant 02/08/2018   Vitamin D deficiency 02/08/2018   Backhand tennis elbow 02/08/2018   Cannot sleep 02/08/2018   Chronic kidney disease (CKD), stage III (moderate) (HCC) 02/08/2018   Current drug use 02/08/2018   Depression, major, single episode, in partial remission (HCC) 02/08/2018   Diabetes mellitus with polyneuropathy (HCC) 02/08/2018   Difficulty hearing 02/08/2018   Disturbance of skin sensation 02/08/2018   Hypertension associated with diabetes (HCC) 02/08/2018   Extremity pain  02/08/2018   Facet syndrome, lumbar 02/08/2018   Factor V Leiden (HCC) 02/08/2018   Fungal infection of nail 02/08/2018   Gastroenteritis and colitis, viral 02/08/2018   Hypoglycemia 02/08/2018   Menopause 02/08/2018   Other long term (current) drug therapy 02/08/2018   Other osteonecrosis, unspecified femur (HCC) 02/08/2018   Pure hypercholesterolemia 02/08/2018   Recurrent major depressive episodes (HCC) 02/08/2018   Syncope and collapse 02/08/2018   Not well controlled moderate persistent asthma 02/08/2018   Cough, persistent 02/08/2018   Restless leg syndrome 01/15/2018   Degeneration of lumbar intervertebral disc 11/21/2017   Tremor of right hand 03/02/2017   Parkinson disease 03/02/2017   UTI (urinary tract infection) 12/01/2015   Type 2 diabetes mellitus with stage 3b chronic kidney disease, with long-term current use of insulin (HCC) 06/03/2015   Fast heart beat 04/09/2015   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 2 diabetes mellitus (HCC) 04/24/2014   Postoperative anemia due to acute blood loss 10/10/2013   Avascular necrosis of bone of left hip (HCC) 10/09/2013   Arthritis of pelvic region, degenerative 10/09/2013   Breath shortness 06/18/2013   Diabetes mellitus type 2, uncontrolled 05/31/2013   Intermittent claudication (HCC) 04/04/2013   Anxiety    Gastroesophageal reflux disease 09/12/2012   Clinical depression 02/21/2012   Stroke (HCC) 02/11/2012   History of revision of total replacement of right hip joint 04/04/2009    ONSET DATE: 07/07/2022  REFERRING DIAG: G20.C (ICD-10-CM) - Parkinsonism, unspecified Parkinsonism type  THERAPY DIAG:  Other abnormalities of gait and mobility  Repeated falls  Unsteadiness on feet  Rationale for Evaluation and Treatment: Rehabilitation  SUBJECTIVE:                                                                                                                                                                                              SUBJECTIVE STATEMENT: Pt,  who is well known to this clinic, presents w/SPC. Pt reports she has had about 5 falls since January, but 3 were very recent. Husband believes this is due to her change in vision, as she has impaired peripheral vision and cannot see the floor. Pt is doing the PWR moves class 2x/week and goes to the gym to use machines 1x/week.   Pt accompanied by:  Husband   PERTINENT HISTORY: asthma, arthritis, sleep apnea, reflux disease, hypertension, hyperlipidemia, avascular necrosis of the hip, back pain, history of chest pain, depression, diabetes, restless leg syndrome, vitamin D deficiency, parkinsonism, and obesity  PAIN:  Are you having pain? No  PRECAUTIONS: Fall  WEIGHT BEARING RESTRICTIONS: No  FALLS: Has patient fallen in last 6 months? Yes. Number of falls 5 - all backwards  LIVING ENVIRONMENT: Lives with: lives with their spouse Lives in: House/apartment Stairs: Yes: Internal: 14 steps; on right going up and External: 3 steps; on left going up Has following equipment at home: Single point cane, Walker - 4 wheeled, and shower chair  PLOF: Requires assistive device for independence and Needs assistance with homemaking  PATIENT GOALS: Husband states that since pt is going to [facility] 3x/week, pt does not require PT at this time.   OBJECTIVE:   COGNITION: Overall cognitive status: History of cognitive impairments - at baseline   POSTURE: rounded shoulders and forward head  BED MOBILITY:  Independent per pt  TRANSFERS: Assistive device utilized: None  Sit to stand: Modified independence Stand to sit: Modified independence Able to perform without UE support  GAIT: Gait pattern: step through pattern, decreased arm swing- Right, decreased arm swing- Left, decreased stride length, and decreased trunk rotation Distance walked: Various clinic distances  Assistive device utilized: Single point cane Level of assistance: Modified independence Comments: Pt  mostly carrying cane on R side. No instability noted   FUNCTIONAL TESTS:   [facility] PT Assessment - 08/31/22 1121       Transfers   Five time sit to stand comments  12.87s   No UE support     Ambulation/Gait   Gait velocity 32.8' over 11.75s = 2.79 ft/s w/SPC               TODAY'S TREATMENT:       Next Session                                                                                                                          PATIENT EDUCATION: Education details: Plan to schedule 26mo PD screens, information on Dr. Arbutus Leas. Encouraged pt and husband to return if mobility needs change  Person educated: Patient and Spouse Education method: Explanation and Handouts Education comprehension: verbalized understanding  GOALS: N/A   ASSESSMENT:  CLINICAL IMPRESSION: Patient is a 67 year old female referred to Neuro OPPT for Parkinsonism.  Pt's PMH is significant for: asthma, arthritis, sleep apnea, reflux disease, hypertension, hyperlipidemia, avascular necrosis of the hip, back pain, history of chest pain, depression,  diabetes, restless leg syndrome, vitamin D deficiency, parkinsonism, and obesity. Pt has sustained several falls recently, but pt's husband believes this is due to pt's impaired peripheral vision and depth perception, not balance. Pt is able to perform floor transfers independently now and has improved both her 5x STS and gait speed since previous bout of therapy. Pt is going to [facility] 3x/week to participate in PWR moves classes and use gym equipment. At this time, pt does not require skilled PT services and neither pt nor husband wish to receive PT at this time. Will schedule pt out for 60mo PD screens.   CLINICAL DECISION MAKING: Evolving/moderate complexity  EVALUATION COMPLEXITY: Moderate  PLAN:  PT FREQUENCY: one time visit   Ralph Brouwer E Katieann Hungate, PT, DPT 08/31/2022, 11:34 AM

## 2022-08-31 NOTE — Therapy (Signed)
OUTPATIENT OCCUPATIONAL THERAPY PARKINSON'S EVALUATION  Patient Name: Deanna Rowe MRN: [id number] DOB:April 03, 1956, 67 y.o., female Today's Date: 08/31/2022  PCP: Clayborn Heron, MD  REFERRING PROVIDER: Huston Foley, MD  END OF SESSION:  OT End of Session - 08/31/22 1129     Visit Number 1    Number of Visits 1    Authorization Type [place] [employer]    OT Start Time 1130    OT Stop Time 1212    OT Time Calculation (min) 42 min    Activity Tolerance Patient tolerated treatment well    Behavior During Therapy WFL for tasks assessed/performed             Past Medical History:  Diagnosis Date   Anemia    Anxiety    Arthritis    L HIP   Asthma    Avascular necrosis of hip (HCC)    LEFT   Back pain    Chest pain    Constipation    Depression    Diabetes mellitus    Diabetes mellitus, type II (HCC)    Dry cough    Edema, lower extremity    Gait abnormality 11/18/2019   GERD (gastroesophageal reflux disease)    High cholesterol    Hypertension    Insomnia    takes Ambien nightly   Joint pain    Kidney disease    Kidney disease    Obesity    Parkinsonism 03/02/2017   PONV (postoperative nausea and vomiting)    RLS (restless legs syndrome) 01/15/2018   Sleep apnea    Swallowing difficulty    Thyroid disease    TIA (transient ischemic attack) 2012   no residual problems   Urgency incontinence    Vitamin D deficiency    Past Surgical History:  Procedure Laterality Date   DILATION AND CURETTAGE OF UTERUS     ESOPHAGEAL MANOMETRY N/A 09/03/2012   Procedure: ESOPHAGEAL MANOMETRY (EM);  Surgeon: Charolett Bumpers, MD;  Location: [facility];  Service: Endoscopy;  Laterality: N/A;   ESOPHAGEAL MANOMETRY N/A 06/19/2017   Procedure: ESOPHAGEAL MANOMETRY (EM);  Surgeon: Vida Rigger, MD;  Location: [facility];  Service: Endoscopy;  Laterality: N/A;   EXPLORATORY LAPAROTOMY     GANGLION CYST EXCISION     JOINT REPLACEMENT  2011   rt total hip   KNEE  ARTHROSCOPY     PILONIDAL CYST / SINUS EXCISION     TOTAL HIP ARTHROPLASTY Left 10/09/2013   Procedure: LEFT TOTAL HIP ARTHROPLASTY ANTERIOR APPROACH;  Surgeon: Loanne Drilling, MD;  Location: [facility];  Service: Orthopedics;  Laterality: Left;   Patient Active Problem List   Diagnosis Date Noted   Diabetes mellitus (HCC) 05/17/2022   Primary hypertension 05/17/2022   BMI 34.0-34.9,adult 05/17/2022   Obesity, Beginning BMI 39.44 05/17/2022   Allergic conjunctivitis of both eyes 07/06/2020   Gait abnormality 11/18/2019   OSA on CPAP 06/04/2019   Pain in joint of left shoulder 03/28/2019   Seasonal allergic conjunctivitis 02/07/2019   Chronic intermittent hypoxia with obstructive sleep apnea 01/25/2019   Severe obstructive sleep apnea 01/25/2019   Excessive daytime sleepiness 01/03/2019   Non-restorative sleep 01/03/2019   Nocturia more than twice per night 01/03/2019   Weight gain, abnormal 01/03/2019   Snorings 01/03/2019   Trochanteric bursitis of right hip 04/27/2018   Encounter for routine screening for malformation using ultrasonics 02/08/2018   Abnormal weight gain 02/08/2018   Acid indigestion 02/08/2018   Acute antritis 02/08/2018  Class 2 severe obesity with serious comorbidity and body mass index (BMI) of 39.0 to 39.9 in adult [facility]) 02/08/2018   Perennial allergic rhinitis 02/08/2018   Antiphospholipid syndrome (HCC) 02/08/2018   Arthralgia of hip or thigh 02/08/2018   Asthma, cough variant 02/08/2018   Vitamin D deficiency 02/08/2018   Backhand tennis elbow 02/08/2018   Cannot sleep 02/08/2018   Chronic kidney disease (CKD), stage III (moderate) (HCC) 02/08/2018   Current drug use 02/08/2018   Depression, major, single episode, in partial remission (HCC) 02/08/2018   Diabetes mellitus with polyneuropathy (HCC) 02/08/2018   Difficulty hearing 02/08/2018   Disturbance of skin sensation 02/08/2018   Hypertension associated with diabetes (HCC) 02/08/2018   Extremity pain  02/08/2018   Facet syndrome, lumbar 02/08/2018   Factor V Leiden (HCC) 02/08/2018   Fungal infection of nail 02/08/2018   Gastroenteritis and colitis, viral 02/08/2018   Hypoglycemia 02/08/2018   Menopause 02/08/2018   Other long term (current) drug therapy 02/08/2018   Other osteonecrosis, unspecified femur (HCC) 02/08/2018   Pure hypercholesterolemia 02/08/2018   Recurrent major depressive episodes (HCC) 02/08/2018   Syncope and collapse 02/08/2018   Not well controlled moderate persistent asthma 02/08/2018   Cough, persistent 02/08/2018   Restless leg syndrome 01/15/2018   Degeneration of lumbar intervertebral disc 11/21/2017   Tremor of right hand 03/02/2017   Parkinson disease 03/02/2017   UTI (urinary tract infection) 12/01/2015   Type 2 diabetes mellitus with stage 3b chronic kidney disease, with long-term current use of insulin (HCC) 06/03/2015   Fast heart beat 04/09/2015   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 2 diabetes mellitus (HCC) 04/24/2014   Postoperative anemia due to acute blood loss 10/10/2013   Avascular necrosis of bone of left hip (HCC) 10/09/2013   Arthritis of pelvic region, degenerative 10/09/2013   Breath shortness 06/18/2013   Diabetes mellitus type 2, uncontrolled 05/31/2013   Intermittent claudication (HCC) 04/04/2013   Anxiety    Gastroesophageal reflux disease 09/12/2012   Clinical depression 02/21/2012   Stroke (HCC) 02/11/2012   History of revision of total replacement of right hip joint 04/04/2009    ONSET DATE: PD diagnosis 2018; 07/08/2022 (date of most recent referral)  REFERRING DIAG: G20.C (ICD-10-CM) - Parkinsonism, unspecified  THERAPY DIAG:  Other symptoms and signs involving the nervous system  Parkinsonism, unspecified Parkinsonism type  Rationale for Evaluation and Treatment: Rehabilitation  SUBJECTIVE:   SUBJECTIVE STATEMENT:  She uses air fryer to cook chicken, fish, veggies. She has been cooking her own breakfast and lunch. Pt  accompanied by:  husband, Maurine Minister  PERTINENT HISTORY: asthma, arthritis, sleep apnea, reflux disease, hypertension, hyperlipidemia, avascular necrosis of the hip, back pain, history of chest pain, depression, diabetes, restless leg syndrome, vitamin D deficiency, parkinsonism, and obesity   PRECAUTIONS: Fall  WEIGHT BEARING RESTRICTIONS: No  PAIN:  Are you having pain? No  FALLS: Has patient fallen in last 6 months? Yes. Number of falls 3; fell from porch (lost balance); fell putting pants on standing up; fell going up steps  LIVING ENVIRONMENT: Lives with: lives with their spouse Lives in: House/apartment Stairs: Yes: Internal: 14 steps; on right going up and External: 3 steps; on left going up Has following equipment at home: Single point cane, Walker - 4 wheeled, and shower chair  PLOF: Requires assistive device for independence and Needs assistance with homemaking;  retired Engineer, civil (consulting); enjoys cooking  PATIENT GOALS: Husband states that since pt is going to [employer] 3x/week, pt does not require OT at this time.  She is doing really well with the social interaction.   OBJECTIVE:   HAND DOMINANCE: Right  ADLs: Overall ADLs: occasional assistance to lace shoes Transfers/ambulation related to ADLs:  Equipment: Shower seat with back; walk-in shower  IADLs: Shopping: usually goes with husband. Feels she needs more help now after stroke.  Light housekeeping: requires more help and more time for completion.  Meal Prep: requires more time Community mobility: dependent Medication management: requires assistance from husband Handwriting: 50% legible  MOBILITY STATUS: Needs Assist: uses cane; SBA  MOBILITY STATUS: Needs Assist: SPC   ACTIVITY TOLERANCE: Activity tolerance: good  FUNCTIONAL OUTCOME MEASURES: Physical performance test: PPT#2 (simulated eating) 13.5 seconds & PPT#4 (donning/doffing jacket): 15 seconds  COGNITION: Overall cognitive status: Impaired and noted  impairments with memory and processing  OBSERVATIONS: Dyskinesias; hearing aids   UPPER EXTREMITY ROM:    WNL  UPPER EXTREMITY MMT:     WNL  HAND FUNCTION: Grip strength: Right: 51.1 lbs; Left: 49.6 lbs  COORDINATION: 9 Hole Peg test: Right: 29 sec; Left: 34 sec  SENSATION: WFL; neuropathy in BLE  EDEMA: none reported or observed  MUSCLE TONE: WNL  VISION: Subjective report: no change Baseline vision: Wears glasses all the time Visual history: cataracts and retinopathy  VISION ASSESSMENT: WFL    TODAY'S TREATMENT:                                                                                                                              N/A  PATIENT EDUCATION: Education details: OT POC Person educated: Patient and Spouse Education method: Explanation Education comprehension: verbalized understanding  HOME EXERCISE PROGRAM: N/A   GOALS:  SHORT TERM GOALS: Target date: 08/31/2022  Pt will return demonstration of PWR step Hand exercise bilaterally as needed for improved ROM and coordination.  Baseline: Goal status: MET   ASSESSMENT:  CLINICAL IMPRESSION: Patient needs addressed at eval. No further need for skilled OT services at this time. Pt is managing PD complications well with local PWR! Classes and should continue classes. Re-consult with OT as needed with any significant functional change or in 6 months for OT, PT, and ST PD screens.   PERFORMANCE DEFICITS: in functional skills including ADLs, IADLs, coordination, and UE functional use, cognitive skills including attention and memory.   IMPAIRMENTS: are limiting patient from ADLs and IADLs.   CO-MORBIDITIES: may have co-morbidities  that affects occupational performance. Patient will benefit from skilled OT to address above impairments and improve overall function.  MODIFICATION OR ASSISTANCE TO COMPLETE EVALUATION: Min-Moderate modification of tasks or assist with assess necessary to complete an  evaluation.  OT OCCUPATIONAL PROFILE AND HISTORY: Problem focused assessment: Including review of records relating to presenting problem.  CLINICAL DECISION MAKING: LOW - limited treatment options, no task modification necessary  REHAB POTENTIAL: Excellent  EVALUATION COMPLEXITY: Low    PLAN:  OT FREQUENCY: Evaluation only  RECOMMENDED OTHER SERVICES: Continue with [facility] PD classes and PWR exercises. Return  to clinic in 6 months for OT, PT, and ST PD screens or sooner as needed.   CONSULTED AND AGREED WITH PLAN OF CARE: Patient and spouse   Delana Meyer, [place] 08/31/2022, 5:23 PM

## 2022-08-31 NOTE — Patient Instructions (Signed)
   PWR! Hand Exercises  Then, start with elbows bent and hands closed:  PWR! Hands: Push hands out BIG. Elbows straight, wrists up, fingers open and spread apart BIG.   PWR! Step: Touch index finger to thumb while keeping other fingers straight. Flick fingers out BIG (thumb out/straighten fingers). Repeat with other fingers. (Step your thumb to each finger).   With arms stretched out in front of you (elbows straight), perform the following:  PWR! Rock:  Move wrists up and down BIG  PWR! Twist: Twist palms up and down BIG    ** Make each movement big and deliberate so that you feel the movement.  Perform at least 10 repetitions 1x/day, but perform PWR! Hands throughout the day when you are having trouble using your hands (picking up/manipulating small objects, writing, eating, typing, sewing, buttoning, etc.).   

## 2022-09-01 DIAGNOSIS — G20C Parkinsonism, unspecified: Secondary | ICD-10-CM | POA: Diagnosis not present

## 2022-09-05 ENCOUNTER — Other Ambulatory Visit (HOSPITAL_BASED_OUTPATIENT_CLINIC_OR_DEPARTMENT_OTHER): Payer: Self-pay

## 2022-09-06 ENCOUNTER — Other Ambulatory Visit (HOSPITAL_BASED_OUTPATIENT_CLINIC_OR_DEPARTMENT_OTHER): Payer: Self-pay

## 2022-09-07 ENCOUNTER — Other Ambulatory Visit (HOSPITAL_BASED_OUTPATIENT_CLINIC_OR_DEPARTMENT_OTHER): Payer: Self-pay

## 2022-09-07 ENCOUNTER — Ambulatory Visit (INDEPENDENT_AMBULATORY_CARE_PROVIDER_SITE_OTHER): Payer: Commercial Managed Care - PPO | Admitting: Family Medicine

## 2022-09-07 ENCOUNTER — Encounter (INDEPENDENT_AMBULATORY_CARE_PROVIDER_SITE_OTHER): Payer: Self-pay | Admitting: Family Medicine

## 2022-09-07 VITALS — BP 112/73 | HR 64 | Temp 97.7°F | Ht 66.0 in | Wt 213.0 lb

## 2022-09-07 DIAGNOSIS — E1169 Type 2 diabetes mellitus with other specified complication: Secondary | ICD-10-CM

## 2022-09-07 DIAGNOSIS — K5909 Other constipation: Secondary | ICD-10-CM | POA: Insufficient documentation

## 2022-09-07 DIAGNOSIS — Z6834 Body mass index (BMI) 34.0-34.9, adult: Secondary | ICD-10-CM

## 2022-09-07 DIAGNOSIS — Z7985 Long-term (current) use of injectable non-insulin antidiabetic drugs: Secondary | ICD-10-CM | POA: Diagnosis not present

## 2022-09-07 DIAGNOSIS — Z794 Long term (current) use of insulin: Secondary | ICD-10-CM

## 2022-09-07 DIAGNOSIS — E669 Obesity, unspecified: Secondary | ICD-10-CM | POA: Diagnosis not present

## 2022-09-07 MED ORDER — POLYETHYLENE GLYCOL 3350 17 GM/SCOOP PO POWD
17.0000 g | Freq: Two times a day (BID) | ORAL | 1 refills | Status: DC | PRN
Start: 2022-09-07 — End: 2022-11-16
  Filled 2022-09-07: qty 2550, 75d supply, fill #0

## 2022-09-07 MED ORDER — TIRZEPATIDE 15 MG/0.5ML ~~LOC~~ SOAJ
15.0000 mg | SUBCUTANEOUS | 0 refills | Status: DC
Start: 2022-09-07 — End: 2022-11-16
  Filled 2022-09-07: qty 2, 28d supply, fill #0
  Filled 2022-11-03: qty 6, 84d supply, fill #0

## 2022-09-07 NOTE — Progress Notes (Unsigned)
Chief Complaint:   OBESITY Deanna Rowe is here to discuss her progress with her obesity treatment plan along with follow-up of her obesity related diagnoses. Deanna Rowe is on the Category 3 Plan with 30 grams of protein at breakfast and states she is following her eating plan approximately 50% of the time. Deanna Rowe states she is doing gym workouts for 30-60 minutes 3 times per week.  Today's visit was #: 46 Starting weight: 237 lbs Starting date: 04/16/2019 Today's weight: 213 lbs Today's date: 09/07/2022 Total lbs lost to date: 24 Total lbs lost since last in-office visit: 1  Interim History: Patient continues to work on her diet and weight loss.  She notes some increase and p.m. hunger which may be due to decreased protein.  Subjective:   1. Other constipation Patient has questions about MiraLAX and how often she can take it, and if it will get her "addicted" to it.  2. Type 2 diabetes mellitus with other specified complication, with long-term current use of insulin (HCC) Patient is working on her diet and she is tolerating Mounjaro.  She denies nausea or vomiting.  Assessment/Plan:   1. Other constipation Patient agreed to start MiraLAX 17 g twice daily OTC, she is okay to take it as long as needed.  No problems with laxative addiction.  - polyethylene glycol powder (GLYCOLAX/MIRALAX) 17 GM/SCOOP powder; Take 17 g by mouth 2 (two) times daily as needed.  Dispense: 3350 g; Refill: 1  2. Type 2 diabetes mellitus with other specified complication, with long-term current use of insulin (HCC) Patient will continue Mounjaro 15 mg, and we will refill for 1 month.  - tirzepatide (MOUNJARO) 15 MG/0.5ML Pen; Inject 15 mg into the skin once a week.  Dispense: 6 mL; Refill: 0  3. BMI 34.0-34.9,adult  4. Obesity, Beginning BMI 39.44 Deanna Rowe is currently in the action stage of change. As such, her goal is to continue with weight loss efforts. She has agreed to change to the Category 3 Plan  and keeping a food journal and adhering to recommended goals of 1300-1500 calories and 90 grams of protein daily.   Exercise goals: As is.   Behavioral modification strategies: increasing lean protein intake and keeping a strict food journal.  Deanna Rowe has agreed to follow-up with our clinic in 4 weeks. She was informed of the importance of frequent follow-up visits to maximize her success with intensive lifestyle modifications for her multiple health conditions.   Objective:   Blood pressure 112/73, pulse 64, temperature 97.7 F (36.5 C), height 5\' 6"  (1.676 m), weight 213 lb (96.6 kg), SpO2 98 %. Body mass index is 34.38 kg/m.  Lab Results  Component Value Date   CREATININE 1.26 (H) 08/27/2022   BUN 16 08/27/2022   NA 128 (L) 08/27/2022   K 3.9 08/27/2022   CL 96 (L) 08/27/2022   CO2 24 08/27/2022   Lab Results  Component Value Date   ALT 5 08/27/2022   AST 18 08/27/2022   ALKPHOS 47 08/27/2022   BILITOT 0.4 08/27/2022   Lab Results  Component Value Date   HGBA1C 7.9 (H) 04/16/2019   HGBA1C 6.9 11/25/2016   HGBA1C 6.0 06/10/2016   HGBA1C 5.9 02/12/2016   HGBA1C 6.1 12/01/2015   Lab Results  Component Value Date   INSULIN 12.3 04/16/2019   Lab Results  Component Value Date   TSH 1.200 04/05/2022   Lab Results  Component Value Date   CHOL 155 04/16/2019   HDL 84 04/16/2019  LDLCALC 57 04/16/2019   TRIG 71 04/16/2019   Lab Results  Component Value Date   VD25OH 44.8 12/17/2019   VD25OH 40.9 04/16/2019   Lab Results  Component Value Date   WBC 6.9 08/27/2022   HGB 11.9 (L) 08/27/2022   HCT 34.9 (L) 08/27/2022   MCV 98.6 08/27/2022   PLT 193 08/27/2022   No results found for: "IRON", "TIBC", "FERRITIN"  Attestation Statements:   Reviewed by clinician on day of visit: allergies, medications, problem list, medical history, surgical history, family history, social history, and previous encounter notes.   I, Burt Knack, am acting as  transcriptionist for Quillian Quince, MD.  I have reviewed the above documentation for accuracy and completeness, and I agree with the above. -  Quillian Quince, MD

## 2022-09-08 ENCOUNTER — Other Ambulatory Visit (HOSPITAL_BASED_OUTPATIENT_CLINIC_OR_DEPARTMENT_OTHER): Payer: Self-pay

## 2022-09-14 ENCOUNTER — Other Ambulatory Visit (HOSPITAL_BASED_OUTPATIENT_CLINIC_OR_DEPARTMENT_OTHER): Payer: Self-pay

## 2022-09-14 ENCOUNTER — Encounter: Payer: Self-pay | Admitting: Neurology

## 2022-09-16 ENCOUNTER — Other Ambulatory Visit (HOSPITAL_COMMUNITY): Payer: Self-pay

## 2022-09-19 ENCOUNTER — Other Ambulatory Visit (HOSPITAL_COMMUNITY): Payer: Self-pay

## 2022-09-21 ENCOUNTER — Other Ambulatory Visit: Payer: Self-pay

## 2022-09-21 ENCOUNTER — Other Ambulatory Visit (HOSPITAL_BASED_OUTPATIENT_CLINIC_OR_DEPARTMENT_OTHER): Payer: Self-pay

## 2022-09-21 DIAGNOSIS — F3181 Bipolar II disorder: Secondary | ICD-10-CM | POA: Diagnosis not present

## 2022-09-21 MED ORDER — BUPROPION HCL ER (XL) 150 MG PO TB24
450.0000 mg | ORAL_TABLET | Freq: Every morning | ORAL | 1 refills | Status: DC
  Filled 2022-09-21: qty 270, 90d supply, fill #0
  Filled 2022-12-20: qty 270, 90d supply, fill #1

## 2022-09-21 MED ORDER — ZOLPIDEM TARTRATE 5 MG PO TABS
5.0000 mg | ORAL_TABLET | Freq: Every day | ORAL | 1 refills | Status: DC
  Filled 2022-09-21: qty 90, 90d supply, fill #0
  Filled 2022-11-21: qty 30, 30d supply, fill #0
  Filled 2022-12-30: qty 30, 30d supply, fill #1
  Filled 2023-01-30: qty 30, 30d supply, fill #2
  Filled 2023-03-13: qty 30, 30d supply, fill #3

## 2022-09-21 MED ORDER — ROPINIROLE HCL 2 MG PO TABS
2.0000 mg | ORAL_TABLET | Freq: Two times a day (BID) | ORAL | 1 refills | Status: DC
  Filled 2022-09-21: qty 360, 180d supply, fill #0
  Filled 2023-01-02: qty 180, 90d supply, fill #0

## 2022-09-21 MED ORDER — LORAZEPAM 2 MG PO TABS
2.0000 mg | ORAL_TABLET | Freq: Three times a day (TID) | ORAL | 1 refills | Status: DC
  Filled 2022-09-21 – 2022-11-21 (×2): qty 270, 90d supply, fill #0
  Filled 2023-02-28: qty 270, 90d supply, fill #1

## 2022-09-21 MED ORDER — TRINTELLIX 20 MG PO TABS
20.0000 mg | ORAL_TABLET | Freq: Every morning | ORAL | 1 refills | Status: DC
  Filled 2022-09-21 – 2022-09-23 (×3): qty 90, 90d supply, fill #0
  Filled 2022-12-19: qty 90, 90d supply, fill #1
  Filled 2022-12-22 (×2): qty 30, 30d supply, fill #1
  Filled 2023-01-17 – 2023-01-23 (×2): qty 30, 30d supply, fill #2
  Filled 2023-02-16 – 2023-02-20 (×2): qty 30, 30d supply, fill #3

## 2022-09-21 MED ORDER — REXULTI 2 MG PO TABS
2.0000 mg | ORAL_TABLET | Freq: Every morning | ORAL | 1 refills | Status: DC
  Filled 2022-09-21 – 2023-01-30 (×33): qty 90, 90d supply, fill #0

## 2022-09-22 ENCOUNTER — Other Ambulatory Visit (HOSPITAL_BASED_OUTPATIENT_CLINIC_OR_DEPARTMENT_OTHER): Payer: Self-pay

## 2022-09-22 ENCOUNTER — Other Ambulatory Visit: Payer: Self-pay

## 2022-09-22 ENCOUNTER — Other Ambulatory Visit (HOSPITAL_COMMUNITY): Payer: Self-pay

## 2022-09-23 ENCOUNTER — Other Ambulatory Visit (HOSPITAL_BASED_OUTPATIENT_CLINIC_OR_DEPARTMENT_OTHER): Payer: Self-pay

## 2022-09-26 ENCOUNTER — Other Ambulatory Visit (HOSPITAL_BASED_OUTPATIENT_CLINIC_OR_DEPARTMENT_OTHER): Payer: Self-pay

## 2022-09-26 ENCOUNTER — Other Ambulatory Visit (HOSPITAL_COMMUNITY): Payer: Self-pay

## 2022-09-27 ENCOUNTER — Other Ambulatory Visit (HOSPITAL_BASED_OUTPATIENT_CLINIC_OR_DEPARTMENT_OTHER): Payer: Self-pay

## 2022-09-28 ENCOUNTER — Other Ambulatory Visit (HOSPITAL_BASED_OUTPATIENT_CLINIC_OR_DEPARTMENT_OTHER): Payer: Self-pay

## 2022-09-29 ENCOUNTER — Other Ambulatory Visit (HOSPITAL_BASED_OUTPATIENT_CLINIC_OR_DEPARTMENT_OTHER): Payer: Self-pay

## 2022-09-30 ENCOUNTER — Other Ambulatory Visit (HOSPITAL_BASED_OUTPATIENT_CLINIC_OR_DEPARTMENT_OTHER): Payer: Self-pay

## 2022-10-03 ENCOUNTER — Other Ambulatory Visit (HOSPITAL_BASED_OUTPATIENT_CLINIC_OR_DEPARTMENT_OTHER): Payer: Self-pay

## 2022-10-04 ENCOUNTER — Other Ambulatory Visit: Payer: Self-pay

## 2022-10-04 ENCOUNTER — Other Ambulatory Visit (HOSPITAL_BASED_OUTPATIENT_CLINIC_OR_DEPARTMENT_OTHER): Payer: Self-pay

## 2022-10-04 NOTE — Progress Notes (Deleted)
TeleHealth Visit:  This visit was completed with telemedicine (audio/video) technology. [place] has verbally consented to this TeleHealth visit. The patient is located at home, the provider is located at home. The participants in this visit include the listed provider and patient. The visit was conducted today via MyChart video.  OBESITY Deanna Rowe is here to discuss her progress with her obesity treatment plan along with follow-up of her obesity related diagnoses.   Today's visit was # 47 Starting weight: 237 lbs Starting date: 04/16/2019 Weight at last in office visit: 213 lbs on 09/07/22 Total weight loss: 24 lbs at last in office visit on 09/07/22. Today's reported weight (10/05/22): {dwwweightreported:29243}  Nutrition Plan: keeping a food journal with goal of 1300-1500 calories and 90 grams of protein daily - ***% adherence.  Current exercise: {exercise types:16438} gym workouts for 30-60 minutes 3 times per week  Interim History:  ***  Eating all of the prescribed protein: {yes***/no:17258} Skipping meals: {dwwyes:29172} Drinking adequate water: {dwwyes:29172} Drinking sugar sweetened beverages: {dwwyes:29172} Hunger controlled: {EWCONTROLASSESSMENT:24261}. Cravings controlled:  {EWCONTROLASSESSMENT:24261}.  Journaling Consistently:  {dwwyes:29172} Meeting protein goals:  {dwwyes:29172} Meeting calorie goals:  {dwwyes:29172}   Pharmacotherapy: Deanna Rowe is on {dwwpharmacotherapy:29109} Adverse side effects: {dwwse:29122} Hunger is {EWCONTROLASSESSMENT:24261}.  Cravings are {EWCONTROLASSESSMENT:24261}.  Assessment/Plan:  1. 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 Last A1c was 6.3 on 08/26/22 at Dr. Daune Perch office. CBGs: {dwwcbg:29160} Episodes of hypoglycemia: {yes***/no:17258} Medication(s): Mounjaro 15 mg SQ weekly, Deanna Rowe *** units daily, Humalog with meals (CBG 151 to 250 - 2 units, if above 250 - 4  units, below 70 do not take Subcutaneous up to three times a day), Farxiga 5 mg daily  Lab Results  Component Value Date   HGBA1C 7.9 (H) 04/16/2019   HGBA1C 6.9 11/25/2016   HGBA1C 6.0 06/10/2016   Lab Results  Component Value Date   MICROALBUR <0.7 02/12/2016   LDLCALC 57 04/16/2019   CREATININE 1.26 (H) 08/27/2022   No results found for: "GFR"  Plan: {dwwmed:29123} {dwwpharmacotherapy:29109}   2. ***  3. ***  {dwwmorbid:29108::"Morbid Obesity"}: Current BMI ***  Pharmacotherapy Plan {dwwmed:29123}  {dwwpharmacotherapy:29109}  Deanna Rowe {CHL AMB IS/IS NOT:210130109} currently in the action stage of change. As such, her goal is to {MWMwtloss#1:210800005}.  She has agreed to {dwwsldiets:29085}.  Exercise goals: {MWM EXERCISE RECS:23473}  Behavioral modification strategies: {dwwslwtlossstrategies:29088}.  Deanna Rowe has agreed to follow-up with our clinic in {NUMBER 1-10:22536} {dwwfutime:29619}  No orders of the defined types were placed in this encounter.   There are no discontinued medications.   No orders of the defined types were placed in this encounter.     Objective:   VITALS: Per patient if applicable, see vitals. GENERAL: Alert and in no acute distress. CARDIOPULMONARY: No increased WOB. Speaking in clear sentences.  PSYCH: Pleasant and cooperative. Speech normal rate and rhythm. Affect is appropriate. Insight and judgement are appropriate. Attention is focused, linear, and appropriate.  NEURO: Oriented as arrived to appointment on time with no prompting.   Attestation Statements:   Reviewed by clinician on day of visit: allergies, medications, problem list, medical history, surgical history, family history, social history, and previous encounter notes.  ***(delete if time-based billing not used) Time spent on visit including the items listed below was *** minutes.  -preparing to see the patient (e.g., review of tests, history, previous  notes) -obtaining and/or reviewing separately obtained history -counseling and educating the patient/family/caregiver -documenting clinical information in the electronic or other health  record -ordering medications, tests, or procedures -independently interpreting results and communicating results to the patient/ family/caregiver -referring and communicating with other health care professionals  -care coordination   This was prepared with the assistance of Engineer, civil (consulting).  Occasional wrong-word or sound-a-like substitutions may have occurred due to the inherent limitations of voice recognition software.    if blood sugar is 151 to 250 mg/dL then take 2 units, if above 250 mg/dL then take 4 units, below 70 does not take Subcutaneous up to three times a day

## 2022-10-05 ENCOUNTER — Telehealth (INDEPENDENT_AMBULATORY_CARE_PROVIDER_SITE_OTHER): Payer: Commercial Managed Care - PPO | Admitting: Family Medicine

## 2022-10-05 ENCOUNTER — Other Ambulatory Visit (HOSPITAL_BASED_OUTPATIENT_CLINIC_OR_DEPARTMENT_OTHER): Payer: Self-pay

## 2022-10-05 ENCOUNTER — Telehealth (INDEPENDENT_AMBULATORY_CARE_PROVIDER_SITE_OTHER): Payer: Self-pay | Admitting: Family Medicine

## 2022-10-05 ENCOUNTER — Other Ambulatory Visit: Payer: Self-pay

## 2022-10-05 DIAGNOSIS — Z6834 Body mass index (BMI) 34.0-34.9, adult: Secondary | ICD-10-CM

## 2022-10-05 DIAGNOSIS — E1169 Type 2 diabetes mellitus with other specified complication: Secondary | ICD-10-CM

## 2022-10-05 DIAGNOSIS — N1831 Chronic kidney disease, stage 3a: Secondary | ICD-10-CM | POA: Diagnosis not present

## 2022-10-05 DIAGNOSIS — E669 Obesity, unspecified: Secondary | ICD-10-CM

## 2022-10-05 NOTE — Telephone Encounter (Signed)
Patient was a no-show for virtual visit.  Waited on the virtual visit until 6 minutes past appointment time per office policy.  Left VM advising patient to call to reschedule.  

## 2022-10-07 ENCOUNTER — Other Ambulatory Visit (HOSPITAL_BASED_OUTPATIENT_CLINIC_OR_DEPARTMENT_OTHER): Payer: Self-pay

## 2022-10-10 ENCOUNTER — Other Ambulatory Visit (HOSPITAL_BASED_OUTPATIENT_CLINIC_OR_DEPARTMENT_OTHER): Payer: Self-pay

## 2022-10-11 ENCOUNTER — Other Ambulatory Visit (HOSPITAL_BASED_OUTPATIENT_CLINIC_OR_DEPARTMENT_OTHER): Payer: Self-pay

## 2022-10-11 ENCOUNTER — Other Ambulatory Visit: Payer: Self-pay

## 2022-10-11 DIAGNOSIS — N2581 Secondary hyperparathyroidism of renal origin: Secondary | ICD-10-CM | POA: Diagnosis not present

## 2022-10-11 DIAGNOSIS — I129 Hypertensive chronic kidney disease with stage 1 through stage 4 chronic kidney disease, or unspecified chronic kidney disease: Secondary | ICD-10-CM | POA: Diagnosis not present

## 2022-10-11 DIAGNOSIS — D631 Anemia in chronic kidney disease: Secondary | ICD-10-CM | POA: Diagnosis not present

## 2022-10-11 DIAGNOSIS — N1832 Chronic kidney disease, stage 3b: Secondary | ICD-10-CM | POA: Diagnosis not present

## 2022-10-12 ENCOUNTER — Other Ambulatory Visit (HOSPITAL_BASED_OUTPATIENT_CLINIC_OR_DEPARTMENT_OTHER): Payer: Self-pay

## 2022-10-13 ENCOUNTER — Other Ambulatory Visit (HOSPITAL_BASED_OUTPATIENT_CLINIC_OR_DEPARTMENT_OTHER): Payer: Self-pay

## 2022-10-14 ENCOUNTER — Other Ambulatory Visit (HOSPITAL_BASED_OUTPATIENT_CLINIC_OR_DEPARTMENT_OTHER): Payer: Self-pay

## 2022-10-14 ENCOUNTER — Other Ambulatory Visit: Payer: Self-pay

## 2022-10-17 ENCOUNTER — Other Ambulatory Visit: Payer: Self-pay

## 2022-10-17 ENCOUNTER — Other Ambulatory Visit (HOSPITAL_BASED_OUTPATIENT_CLINIC_OR_DEPARTMENT_OTHER): Payer: Self-pay

## 2022-10-17 MED ORDER — TECHLITE PEN NEEDLES 32G X 6 MM MISC
1.0000 | Freq: Four times a day (QID) | 0 refills | Status: DC
  Filled 2022-10-17: qty 400, 90d supply, fill #0

## 2022-10-18 ENCOUNTER — Other Ambulatory Visit: Payer: Self-pay

## 2022-10-18 ENCOUNTER — Other Ambulatory Visit (HOSPITAL_BASED_OUTPATIENT_CLINIC_OR_DEPARTMENT_OTHER): Payer: Self-pay

## 2022-10-19 ENCOUNTER — Other Ambulatory Visit: Payer: Self-pay

## 2022-10-19 ENCOUNTER — Other Ambulatory Visit (HOSPITAL_BASED_OUTPATIENT_CLINIC_OR_DEPARTMENT_OTHER): Payer: Self-pay

## 2022-10-19 ENCOUNTER — Other Ambulatory Visit (HOSPITAL_COMMUNITY): Payer: Self-pay

## 2022-10-19 MED ORDER — VALACYCLOVIR HCL 500 MG PO TABS
500.0000 mg | ORAL_TABLET | Freq: Every day | ORAL | 4 refills | Status: DC
  Filled 2022-10-19 (×2): qty 30, 30d supply, fill #0
  Filled 2022-10-19: qty 90, 90d supply, fill #0
  Filled 2022-10-19: qty 30, 30d supply, fill #0
  Filled 2022-10-19: qty 90, 90d supply, fill #0
  Filled 2022-10-20: qty 90, 30d supply, fill #0
  Filled 2022-10-20: qty 30, 30d supply, fill #0
  Filled 2022-11-16 – 2022-11-21 (×3): qty 30, 30d supply, fill #1
  Filled 2022-12-16: qty 30, 30d supply, fill #2
  Filled 2023-01-16: qty 30, 30d supply, fill #3
  Filled 2023-02-13: qty 30, 30d supply, fill #4
  Filled 2023-03-13: qty 30, 30d supply, fill #5
  Filled 2023-04-10: qty 30, 30d supply, fill #6
  Filled 2023-05-10: qty 30, 30d supply, fill #7
  Filled 2023-06-09: qty 30, 30d supply, fill #8
  Filled 2023-07-13: qty 30, 30d supply, fill #9
  Filled 2023-08-14: qty 30, 30d supply, fill #10
  Filled 2023-09-13: qty 30, 30d supply, fill #11
  Filled 2023-10-13: qty 30, 30d supply, fill #12

## 2022-10-19 NOTE — Progress Notes (Signed)
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  She has never been off of an antipsychotic medication.  The diagnosis of idiopathic Parkinson's disease implies that one has not been exposed to antipsychotic medication within the prior 6 months.  Because she has never been off of an antipsychotic, she cannot be given the diagnosis of idiopathic Parkinson's disease.  Whether or not she has idiopathic Parkinson's disease or secondary parkinsonism from antipsychotics, it would behoove her to have a conversation with her psychiatrist to see if these medications can be limited or eliminated altogether.  She is on very high-dose levodopa therapy (3000mg  of levodopa/day)with a strange combination of extended release and immediate release during the day.  -I recommended skin bx for alpha synuclein.  Discussed r/b/se.  She was given information and will think about this  -regardless of above findings, I told her I think her levodopa/dosing needs reworked and decreased.    2. Anxiety and depression  -Follows with [name]  -On Rexulti, as above  -On fairly high-dose lorazepam, 2 mg 3 times per day.  Whether she has Parkinson's disease or secondary parkinsonism, this is a medicine we would greatly like to limit due to the risk of falls and gait instability.  She has been on this a very long time per PDMP.  She is also on 90 Ambien per month.   3. Urinary incontinence  -on myrbetriq and vesicare  -following [facility]  4. Achalasia  -is source of dysphagia  -follows with GI Subjective:   Deanna Rowe was seen today in the movement disorders clinic for neurologic consultation at the request of Soundra Pilon, FNP.  The consultation is for a second opinion regarding Parkinsons disease.  Medical records made available to me are reviewed.  Patient has been under the care  of [facility] for many years.  First notes that I have from Dr. Anne Hahn are from 2018 (she saw him before that but record conversion did not allow me to see with those records).  He noted in his 2018 note that patient presented with right upper extremity resting tremor.  He noted patient had been on Abilify for at least 2 years.  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  In October, 2019, the patient was still on Abilify (dose somewhat reduced) and levodopa was started.  In 2020, the patient was changed to Rexulti.  By February, 2021, the patient was on carbidopa/levodopa 25/100, 2 tablets 3 times per day.  When Dr. Anne Hahn retired, the patient began to see Dr. Frances Furbish in October, 2022.  She did note that it was unclear if the patient had drug-induced parkinsonism more Parkinson's disease.  Patient was last seen by [facility] neurolog[facility] in May, 2023.  Patient's current prescriptions from [facility]: Carbidopa/levodopa 50/200 CR, 1 tablet 5 times per day (she takes this at 8am/10am/3pm/6pm/8pm) Carbidopa/levodopa 25/250, 1 tablet twice per day (6am/2pm) Ropinirole 2 mg twice per day (8am/11pm)  She reports that if she misses a med she "feels bad" and will sleep more and balance is more.  Specific Symptoms:  Tremor: did in the past but not so much now; husband reports she has the "lip quivers" Family hx of similar:  No. Voice: no change Sleep: sleeps pretty well b/c takes [place]  Vivid Dreams:  No.  Acting out dreams:  No. Wet Pillows: No. Postural symptoms:  Yes.  Falls?  Yes.  , never had a fx; last fall was a month ago - she was in the kitchen and tripped over foot; about 6 months ago she was having falls in the middle of the night going to the [place] but that has resolved with [employer] classes and has not had falls since she started those classes; she tends to fall backward Bradykinesia symptoms: difficulty getting out of a chair (but this is much better after  classes at [facility]); she is shuffling some Loss of smell:  No. Loss of taste:  No. Urinary Incontinence:  Yes.  , wears a depends - sees [facility] Difficulty Swallowing:  sees GI for achalasia Handwriting, micrographia: No. Trouble with ADL's:  No.  Trouble buttoning clothing: No. Depression:  No. But admits to anxiety Memory changes:  Yes.  ,  Notes from [facility] neurology indicated that this got worse with the ECT years ago Hallucinations:  No.  visual distortions: No. N/V:  No. Lightheaded:  occ  Syncope: No. Diplopia:  No. Dyskinesia:  No. Prior exposure to reglan/antipsychotics: Yes.  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  I personally reviewed it    ALLERGIES:   Allergies  Allergen Reactions   Fluticasone-Salmeterol Anaphylaxis   Codeine Nausea And Vomiting   Fetzima [Levomilnacipran] Nausea And Vomiting   Nsaids Other (See Comments)    Upset stomach    Trulicity [Dulaglutide] Nausea And Vomiting    Significant and undesirably rapid (40 lbs) weight loss    Xanax [Alprazolam] Other (See Comments)    disoriented   Amoxicillin Rash    Has patient had a PCN reaction causing immediate rash, facial/tongue/throat swelling, SOB or lightheadedness with hypotension: Yes Has patient had a PCN reaction causing severe rash involving mucus membranes or skin necrosis: No Has patient had a PCN reaction that required hospitalization: No Has patient had a PCN reaction occurring within the last 10 years: No If all of the above answers are "NO", then may proceed with Cephalosporin use.    Pseudoephedrine Hcl Er Palpitations    CURRENT MEDICATIONS:  Current Meds  Medication Sig   ACCU-CHEK FASTCLIX LANCETS MISC USE TO CHECK BLOOD SUGAR 3 TIMES A DAY   ACCU-CHEK GUIDE test strip USE TO CHECK BLOOD SUGAR 3 TIMES PER DAY. (Patient taking differently: Use to check blood sugar 4 times per day.)   albuterol (VENTOLIN HFA) 108 (90  Base) MCG/ACT inhaler Inhale 2 puffs into the lungs as needed for wheezing or shortness of breath.   amLODipine (NORVASC) 5 MG tablet Take 1 tablet (5 mg total) by mouth daily.   aspirin EC 81 MG tablet Take 81 mg by mouth every evening.   atorvastatin (LIPITOR) 10 MG tablet TAKE 1 TABLET BY MOUTH AT BEDTIME   azelastine (ASTELIN) 0.1 % nasal spray Place 2 sprays into both nostrils 2 (two) times daily as needed.   brexpiprazole (REXULTI) 2 MG TABS tablet Take 1 tablet (2 mg total) by mouth in the morning.   brexpiprazole (REXULTI) 2 MG TABS tablet Take 1 tablet (2 mg total) by mouth every morning.   buPROPion (WELLBUTRIN XL) 150 MG 24 hr tablet Take 3 tablets (450 mg total) by mouth in the morning.   Calcium Carbonate-Vitamin D 600-400 MG-UNIT tablet Take 1 tablet by mouth daily.   carbidopa-levodopa (SINEMET CR) 50-200 MG tablet Take 1 tablet by mouth 5 (five) times daily.   carbidopa-levodopa (SINEMET) 25-250 MG tablet  Take 1 tablet by mouth every morning AND 1 tablet daily in the afternoon at 2 pm.   Continuous Blood Gluc Receiver (DEXCOM G7 RECEIVER) DEVI Use as directed to check blood sugar   Continuous Blood Gluc Sensor (DEXCOM G7 SENSOR) MISC Use to check blood sugar and change sensor every 10 days   dapagliflozin propanediol (FARXIGA) 5 MG TABS tablet Take 1 tablet (5 mg total) by mouth daily.   EPINEPHrine (EPIPEN 2-PAK) 0.3 mg/0.3 mL IJ SOAJ injection Inject 0.3 mg into the muscle as needed for anaphylaxis.   gabapentin (NEURONTIN) 300 MG capsule Take 1 capsule (300 mg total) by mouth 3 (three) times daily.   glucose blood test strip Use to check blood sugar 4 times daily   HYDROcodone-acetaminophen (NORCO/VICODIN) 5-325 MG tablet Take 1 tablet by mouth every 6 (six) hours as needed for 10 days.   insulin degludec (TRESIBA FLEXTOUCH) 100 UNIT/ML FlexTouch Pen INJECT UNDER THE SKIN ONCE DAILY. TRITRATE AS DIRECTED WITH A MAXIMUM DAILY DOSE OF 50 UNITS   insulin lispro (HUMALOG) 100  UNIT/ML injection SQ - 14 units at breakfast, 12 units at lunch, 16 units at supper   Insulin Pen Needle (TECHLITE PEN NEEDLES) 32G X 6 MM MISC use to Inject insulin into the skin 4 (four) times daily.   Insulin Pen Needle (TECHLITE PEN NEEDLES) 32G X 6 MM MISC Use to inject insulin 4 (four) times daily.   LORazepam (ATIVAN) 2 MG tablet Take 1 tablet (2 mg total) by mouth 3 (three) times daily.   losartan (COZAAR) 100 MG tablet Take 1 tablet (100 mg total) by mouth daily.   mirabegron ER (MYRBETRIQ) 50 MG TB24 tablet Take 1 tablet by mouth everyday   Olopatadine HCl (PATADAY) 0.2 % SOLN Place 1 drop into both eyes daily as needed.   omalizumab Geoffry [place]) 150 MG/ML prefilled syringe Inject 300 mg into the skin every 28 (twenty-eight) days.   polyethylene glycol powder (GLYCOLAX/MIRALAX) 17 GM/SCOOP powder Take 17 g by mouth 2 (two) times daily as needed.   propranolol (INDERAL) 40 MG tablet Take 1 tablet (40 mg total) by mouth 2 (two) times daily.   rOPINIRole (REQUIP) 2 MG tablet Take 1 tablet (2 mg total) by mouth 2 (two) times daily.   solifenacin (VESICARE) 10 MG tablet Take 1 tablet (10 mg total) by mouth daily.   tirzepatide (MOUNJARO) 15 MG/0.5ML Pen Inject 15 mg into the skin once a week.   triamcinolone (NASACORT) 55 MCG/ACT AERO nasal inhaler Place 2 sprays into the nose daily.   valACYclovir (VALTREX) 500 MG tablet Take 1 tablet by mouth everyday   valACYclovir (VALTREX) 500 MG tablet Take 1 tablet (500 mg total) by mouth daily.   vitamin C (ASCORBIC ACID) 500 MG tablet Take 500 mg by mouth daily.   Vitamin D, Ergocalciferol, 2000 units CAPS Take 1 capsule by mouth daily. 2000 units daily.   vortioxetine HBr (TRINTELLIX) 20 MG TABS tablet Take 1 tablet (20 mg total) by mouth every morning.   zolpidem (AMBIEN) 5 MG tablet Take 1 tablet (5 mg total) by mouth at bedtime.     Objective:   VITALS:   Vitals:   10/24/22 0907  BP: 124/86  Pulse: 77  SpO2: 98%  Weight: 216 lb 3.2 oz  (98.1 kg)  Height: 5\' 6"  (1.676 m)    GEN:  The patient appears stated age and is in NAD. HEENT:  Normocephalic, atraumatic.  The mucous membranes are moist. The superficial temporal arteries are without  ropiness or tenderness. CV:  RRR Lungs:  CTAB Neck/HEME:  There are no carotid bruits bilaterally.  Neurological examination:  Orientation: The patient is alert and oriented x3.  Cranial nerves: There is good facial symmetry. Extraocular muscles are intact. The visual fields are full to confrontational testing. The speech is fluent and clear. Soft palate rises symmetrically and there is no tongue deviation. Hearing is intact to conversational tone. Sensation: Sensation is intact to light and pinprick throughout (facial, trunk, extremities). Vibration is decreased distally. There is no extinction with double simultaneous stimulation. There is no sensory dermatomal level identified. Motor: Strength is 5/5 in the bilateral upper and lower extremities.   Shoulder shrug is equal and symmetric.  There is no pronator drift. Deep tendon reflexes: Deep tendon reflexes are 1/4 at the bilateral biceps, triceps, brachioradialis, patella and achilles. Plantar responses are downgoing bilaterally.  Movement examination: Tone: There is nl tone in the bilateral upper extremities.  The tone in the lower extremities is nl.  Abnormal movements: there is jaw tremor Coordination:  There is  decremation with RAM's, only with finger taps on the R.  All other RAMs are nl, with any form of RAMS, including alternating supination and pronation of the forearm, hand opening and closing,  heel taps and toe taps bilaterally.  Gait and Station: The patient has no difficulty arising out of a deep-seated chair without the use of the hands. The patient's stride length is good with mild decreased arm swing on the R.  The patient has a pos pull test.     I have reviewed and interpreted the following labs independently   Chemistry       Component Value Date/Time   NA 128 (L) 08/27/2022 2108   NA 138 04/05/2022 0942   NA 142 01/12/2017 0914   K 3.9 08/27/2022 2108   K 4.2 01/12/2017 0914   CL 96 (L) 08/27/2022 2108   CO2 24 08/27/2022 2108   CO2 28 01/12/2017 0914   BUN 16 08/27/2022 2108   BUN 19 04/05/2022 0942   BUN 12.0 01/12/2017 0914   CREATININE 1.26 (H) 08/27/2022 2108   CREATININE 1.58 (H) 10/12/2018 1121   CREATININE 1.3 (H) 01/12/2017 0914      Component Value Date/Time   CALCIUM 8.7 (L) 08/27/2022 2108   CALCIUM 9.6 01/12/2017 0914   ALKPHOS 47 08/27/2022 2108   ALKPHOS 43 01/12/2017 0914   AST 18 08/27/2022 2108   AST 16 10/12/2018 1121   AST 19 01/12/2017 0914   ALT 5 08/27/2022 2108   ALT <6 10/12/2018 1121   ALT 18 01/12/2017 0914   BILITOT 0.4 08/27/2022 2108   BILITOT 0.3 04/05/2022 0942   BILITOT 0.2 (L) 10/12/2018 1121   BILITOT 0.28 01/12/2017 0914      Lab Results  Component Value Date   TSH 1.200 04/05/2022   Lab Results  Component Value Date   WBC 6.9 08/27/2022   HGB 11.9 (L) 08/27/2022   HCT 34.9 (L) 08/27/2022   MCV 98.6 08/27/2022   PLT 193 08/27/2022     Total time spent on today's visit was 75 minutes, including both face-to-face time and nonface-to-face time.  Time included that spent on review of records (prior notes available to me/labs/imaging if pertinent), discussing treatment and goals, answering patient's questions and coordinating care.  Cc:  Rankins, Fanny Dance, MD

## 2022-10-20 ENCOUNTER — Other Ambulatory Visit (HOSPITAL_BASED_OUTPATIENT_CLINIC_OR_DEPARTMENT_OTHER): Payer: Self-pay

## 2022-10-20 ENCOUNTER — Other Ambulatory Visit: Payer: Self-pay

## 2022-10-20 ENCOUNTER — Other Ambulatory Visit (HOSPITAL_COMMUNITY): Payer: Self-pay

## 2022-10-21 ENCOUNTER — Other Ambulatory Visit (HOSPITAL_BASED_OUTPATIENT_CLINIC_OR_DEPARTMENT_OTHER): Payer: Self-pay

## 2022-10-24 ENCOUNTER — Encounter: Payer: Self-pay | Admitting: Neurology

## 2022-10-24 ENCOUNTER — Ambulatory Visit (INDEPENDENT_AMBULATORY_CARE_PROVIDER_SITE_OTHER): Payer: Commercial Managed Care - PPO | Admitting: Neurology

## 2022-10-24 ENCOUNTER — Other Ambulatory Visit (HOSPITAL_BASED_OUTPATIENT_CLINIC_OR_DEPARTMENT_OTHER): Payer: Self-pay

## 2022-10-24 VITALS — BP 124/86 | HR 77 | Ht 66.0 in | Wt 216.2 lb

## 2022-10-24 DIAGNOSIS — G2111 Neuroleptic induced parkinsonism: Secondary | ICD-10-CM | POA: Diagnosis not present

## 2022-10-24 DIAGNOSIS — R32 Unspecified urinary incontinence: Secondary | ICD-10-CM | POA: Diagnosis not present

## 2022-10-24 DIAGNOSIS — T43505A Adverse effect of unspecified antipsychotics and neuroleptics, initial encounter: Secondary | ICD-10-CM

## 2022-10-24 DIAGNOSIS — K220 Achalasia of cardia: Secondary | ICD-10-CM

## 2022-10-24 NOTE — Patient Instructions (Signed)
It was really good to see you today!  We discussed skin biopsies along with risks and benefits.  You can let us know if you would like to proceed.    The physicians and staff at [facility] are committed to providing excellent care. You may receive a survey requesting feedback about your experience at our office. We strive to receive "very good" responses to the survey questions. If you feel that your experience would prevent you from giving the office a "very good " response, please contact our office to try to remedy the situation. We may be reached at [phone]. Thank you for taking the time out of your busy day to complete the survey.

## 2022-10-25 ENCOUNTER — Other Ambulatory Visit (HOSPITAL_COMMUNITY): Payer: Self-pay

## 2022-10-25 ENCOUNTER — Other Ambulatory Visit: Payer: Self-pay

## 2022-10-26 ENCOUNTER — Other Ambulatory Visit (HOSPITAL_BASED_OUTPATIENT_CLINIC_OR_DEPARTMENT_OTHER): Payer: Self-pay

## 2022-10-26 ENCOUNTER — Telehealth: Payer: Self-pay | Admitting: Neurology

## 2022-10-26 NOTE — Telephone Encounter (Signed)
Talked to patient getting skin biopsy paperwork sent off

## 2022-10-26 NOTE — Telephone Encounter (Signed)
Patient called and left a message, she would like to proceed with the skin biopsy

## 2022-10-27 ENCOUNTER — Other Ambulatory Visit (HOSPITAL_BASED_OUTPATIENT_CLINIC_OR_DEPARTMENT_OTHER): Payer: Self-pay

## 2022-10-28 ENCOUNTER — Other Ambulatory Visit (HOSPITAL_BASED_OUTPATIENT_CLINIC_OR_DEPARTMENT_OTHER): Payer: Self-pay

## 2022-10-28 ENCOUNTER — Other Ambulatory Visit (HOSPITAL_COMMUNITY): Payer: Self-pay

## 2022-10-30 ENCOUNTER — Other Ambulatory Visit (HOSPITAL_BASED_OUTPATIENT_CLINIC_OR_DEPARTMENT_OTHER): Payer: Self-pay

## 2022-10-31 ENCOUNTER — Other Ambulatory Visit (HOSPITAL_BASED_OUTPATIENT_CLINIC_OR_DEPARTMENT_OTHER): Payer: Self-pay

## 2022-10-31 ENCOUNTER — Other Ambulatory Visit: Payer: Self-pay

## 2022-11-01 ENCOUNTER — Other Ambulatory Visit (HOSPITAL_BASED_OUTPATIENT_CLINIC_OR_DEPARTMENT_OTHER): Payer: Self-pay

## 2022-11-01 ENCOUNTER — Other Ambulatory Visit (HOSPITAL_COMMUNITY): Payer: Self-pay

## 2022-11-02 ENCOUNTER — Other Ambulatory Visit: Payer: Self-pay

## 2022-11-03 ENCOUNTER — Other Ambulatory Visit (HOSPITAL_BASED_OUTPATIENT_CLINIC_OR_DEPARTMENT_OTHER): Payer: Self-pay

## 2022-11-03 ENCOUNTER — Ambulatory Visit (INDEPENDENT_AMBULATORY_CARE_PROVIDER_SITE_OTHER): Payer: Commercial Managed Care - PPO | Admitting: Family Medicine

## 2022-11-04 ENCOUNTER — Other Ambulatory Visit: Payer: Self-pay

## 2022-11-04 ENCOUNTER — Other Ambulatory Visit (HOSPITAL_BASED_OUTPATIENT_CLINIC_OR_DEPARTMENT_OTHER): Payer: Self-pay

## 2022-11-14 ENCOUNTER — Other Ambulatory Visit: Payer: Self-pay

## 2022-11-15 ENCOUNTER — Ambulatory Visit (INDEPENDENT_AMBULATORY_CARE_PROVIDER_SITE_OTHER): Payer: Commercial Managed Care - PPO | Admitting: Family Medicine

## 2022-11-15 DIAGNOSIS — N3941 Urge incontinence: Secondary | ICD-10-CM | POA: Diagnosis not present

## 2022-11-15 DIAGNOSIS — R350 Frequency of micturition: Secondary | ICD-10-CM | POA: Diagnosis not present

## 2022-11-16 ENCOUNTER — Ambulatory Visit (INDEPENDENT_AMBULATORY_CARE_PROVIDER_SITE_OTHER): Payer: Commercial Managed Care - PPO | Admitting: Family Medicine

## 2022-11-16 ENCOUNTER — Encounter (INDEPENDENT_AMBULATORY_CARE_PROVIDER_SITE_OTHER): Payer: Self-pay | Admitting: Family Medicine

## 2022-11-16 ENCOUNTER — Other Ambulatory Visit: Payer: Self-pay

## 2022-11-16 ENCOUNTER — Other Ambulatory Visit (HOSPITAL_BASED_OUTPATIENT_CLINIC_OR_DEPARTMENT_OTHER): Payer: Self-pay

## 2022-11-16 ENCOUNTER — Other Ambulatory Visit (HOSPITAL_COMMUNITY): Payer: Self-pay

## 2022-11-16 VITALS — BP 116/74 | HR 65 | Temp 97.7°F | Ht 66.0 in | Wt 209.0 lb

## 2022-11-16 DIAGNOSIS — E669 Obesity, unspecified: Secondary | ICD-10-CM | POA: Diagnosis not present

## 2022-11-16 DIAGNOSIS — Z6833 Body mass index (BMI) 33.0-33.9, adult: Secondary | ICD-10-CM | POA: Diagnosis not present

## 2022-11-16 DIAGNOSIS — Z7985 Long-term (current) use of injectable non-insulin antidiabetic drugs: Secondary | ICD-10-CM

## 2022-11-16 DIAGNOSIS — Z794 Long term (current) use of insulin: Secondary | ICD-10-CM | POA: Diagnosis not present

## 2022-11-16 DIAGNOSIS — I10 Essential (primary) hypertension: Secondary | ICD-10-CM

## 2022-11-16 DIAGNOSIS — E1169 Type 2 diabetes mellitus with other specified complication: Secondary | ICD-10-CM | POA: Diagnosis not present

## 2022-11-16 MED ORDER — TIRZEPATIDE 15 MG/0.5ML ~~LOC~~ SOAJ
15.0000 mg | SUBCUTANEOUS | 0 refills | Status: DC
Start: 2022-11-16 — End: 2023-01-25
  Filled 2022-11-16: qty 6, 84d supply, fill #0

## 2022-11-17 ENCOUNTER — Other Ambulatory Visit: Payer: Self-pay

## 2022-11-18 ENCOUNTER — Other Ambulatory Visit (HOSPITAL_COMMUNITY): Payer: Self-pay

## 2022-11-18 ENCOUNTER — Other Ambulatory Visit (HOSPITAL_BASED_OUTPATIENT_CLINIC_OR_DEPARTMENT_OTHER): Payer: Self-pay

## 2022-11-18 ENCOUNTER — Other Ambulatory Visit: Payer: Self-pay

## 2022-11-18 MED ORDER — MIRABEGRON ER 50 MG PO TB24
50.0000 mg | ORAL_TABLET | Freq: Every day | ORAL | 3 refills | Status: DC
  Filled 2022-11-21 – 2022-11-29 (×7): qty 90, 90d supply, fill #0

## 2022-11-18 MED ORDER — MIRABEGRON ER 50 MG PO TB24
50.0000 mg | ORAL_TABLET | Freq: Every day | ORAL | 3 refills | Status: DC
  Filled 2022-11-18 – 2022-12-05 (×12): qty 90, 90d supply, fill #0
  Filled 2022-12-23: qty 30, 30d supply, fill #0
  Filled 2022-12-23 – 2022-12-29 (×5): qty 90, 90d supply, fill #0

## 2022-11-19 ENCOUNTER — Other Ambulatory Visit: Payer: Self-pay

## 2022-11-21 ENCOUNTER — Other Ambulatory Visit (HOSPITAL_BASED_OUTPATIENT_CLINIC_OR_DEPARTMENT_OTHER): Payer: Self-pay

## 2022-11-21 ENCOUNTER — Other Ambulatory Visit (HOSPITAL_COMMUNITY): Payer: Self-pay

## 2022-11-21 ENCOUNTER — Other Ambulatory Visit: Payer: Self-pay

## 2022-11-21 MED ORDER — GABAPENTIN 300 MG PO CAPS
300.0000 mg | ORAL_CAPSULE | Freq: Three times a day (TID) | ORAL | 0 refills | Status: DC
  Filled 2022-11-21: qty 90, 30d supply, fill #0
  Filled 2022-11-21: qty 270, 90d supply, fill #0
  Filled 2022-11-21: qty 90, 30d supply, fill #0
  Filled 2022-12-20: qty 90, 30d supply, fill #1
  Filled 2023-01-16: qty 90, 30d supply, fill #2

## 2022-11-21 NOTE — Progress Notes (Unsigned)
Chief Complaint:   OBESITY Deanna Rowe is here to discuss her progress with her obesity treatment plan along with follow-up of her obesity related diagnoses. Deanna Rowe is on keeping a food journal and adhering to recommended goals of 1300-1500 calories and 90 grams of protein and states she is following her eating plan approximately 50% of the time. Deanna Rowe states she is doing power move. Machines, and physical therapy for 60 minutes 3 times per week.  Today's visit was #: 47 Starting weight: 237 lbs Starting date: 04/16/2019 Today's weight: 209 lbs Today's date: 11/16/2022 Total lbs lost to date: 28 Total lbs lost since last in-office visit: 4  Interim History: Patient is doing well with her weight loss.  She has increased her activity and exercise.  She struggles at times to eat all of the food on her plan, and her RMR may be decreasing due to this.  Subjective:   1. Type 2 diabetes mellitus with other specified complication, with long-term current use of insulin (HCC) Patient is on Mounjaro, and she notes decreased polyphagia.  She denies nausea or vomiting.  2. Primary hypertension Patient's blood pressure is stable with her diet, exercise, weight loss, and medications.  She has no signs of hypotension.  Assessment/Plan:   1. Type 2 diabetes mellitus with other specified complication, with long-term current use of insulin (HCC) Patient will continue Mounjaro 15 mg once weekly, and we will refill for 1 month.  - tirzepatide (MOUNJARO) 15 MG/0.5ML Pen; Inject 15 mg into the skin once a week.  Dispense: 6 mL; Refill: 0  2. Primary hypertension Patient will continue working on her diet, exercise, and weight loss, and we will follow-up at her next visit in 1 month.  3. BMI 33.0-33.9,adult  4. Obesity, Beginning BMI 39.44 Deanna Rowe is currently in the action stage of change. As such, her goal is to continue with weight loss efforts. She has agreed to the Category 3 Plan.   Exercise  goals: As is.   Behavioral modification strategies: increasing lean protein intake and no skipping meals.  Deanna Rowe has agreed to follow-up with our clinic in 4 weeks. She was informed of the importance of frequent follow-up visits to maximize her success with intensive lifestyle modifications for her multiple health conditions.   Objective:   Blood pressure 116/74, pulse 65, temperature 97.7 F (36.5 C), height 5\' 6"  (1.676 m), weight 209 lb (94.8 kg), SpO2 95%. Body mass index is 33.73 kg/m.  Lab Results  Component Value Date   CREATININE 1.26 (H) 08/27/2022   BUN 16 08/27/2022   NA 128 (L) 08/27/2022   K 3.9 08/27/2022   CL 96 (L) 08/27/2022   CO2 24 08/27/2022   Lab Results  Component Value Date   ALT 5 08/27/2022   AST 18 08/27/2022   ALKPHOS 47 08/27/2022   BILITOT 0.4 08/27/2022   Lab Results  Component Value Date   HGBA1C 7.9 (H) 04/16/2019   HGBA1C 6.9 11/25/2016   HGBA1C 6.0 06/10/2016   HGBA1C 5.9 02/12/2016   HGBA1C 6.1 12/01/2015   Lab Results  Component Value Date   INSULIN 12.3 04/16/2019   Lab Results  Component Value Date   TSH 1.200 04/05/2022   Lab Results  Component Value Date   CHOL 155 04/16/2019   HDL 84 04/16/2019   LDLCALC 57 04/16/2019   TRIG 71 04/16/2019   Lab Results  Component Value Date   VD25OH 44.8 12/17/2019   VD25OH 40.9 04/16/2019   Lab  Results  Component Value Date   WBC 6.9 08/27/2022   HGB 11.9 (L) 08/27/2022   HCT 34.9 (L) 08/27/2022   MCV 98.6 08/27/2022   PLT 193 08/27/2022   No results found for: "IRON", "TIBC", "FERRITIN"  Attestation Statements:   Reviewed by clinician on day of visit: allergies, medications, problem list, medical history, surgical history, family history, social history, and previous encounter notes.  I have personally spent 40 minutes total time today in preparation, patient care, and documentation for this visit, including the following: review of clinical lab tests; review of  medical tests/procedures/services.  I, Burt Knack, am acting as transcriptionist for Quillian Quince, MD.  I have reviewed the above documentation for accuracy and completeness, and I agree with the above. -  Quillian Quince, MD

## 2022-11-22 ENCOUNTER — Other Ambulatory Visit: Payer: Self-pay

## 2022-11-23 ENCOUNTER — Other Ambulatory Visit (HOSPITAL_BASED_OUTPATIENT_CLINIC_OR_DEPARTMENT_OTHER): Payer: Self-pay

## 2022-11-24 ENCOUNTER — Other Ambulatory Visit (HOSPITAL_BASED_OUTPATIENT_CLINIC_OR_DEPARTMENT_OTHER): Payer: Self-pay

## 2022-11-25 ENCOUNTER — Other Ambulatory Visit (HOSPITAL_COMMUNITY): Payer: Self-pay

## 2022-11-25 ENCOUNTER — Other Ambulatory Visit (HOSPITAL_BASED_OUTPATIENT_CLINIC_OR_DEPARTMENT_OTHER): Payer: Self-pay

## 2022-11-28 ENCOUNTER — Other Ambulatory Visit (HOSPITAL_BASED_OUTPATIENT_CLINIC_OR_DEPARTMENT_OTHER): Payer: Self-pay

## 2022-11-29 ENCOUNTER — Other Ambulatory Visit (HOSPITAL_BASED_OUTPATIENT_CLINIC_OR_DEPARTMENT_OTHER): Payer: Self-pay

## 2022-11-29 ENCOUNTER — Other Ambulatory Visit (HOSPITAL_COMMUNITY): Payer: Self-pay

## 2022-11-29 ENCOUNTER — Other Ambulatory Visit: Payer: Self-pay

## 2022-11-29 DIAGNOSIS — Z01419 Encounter for gynecological examination (general) (routine) without abnormal findings: Secondary | ICD-10-CM | POA: Diagnosis not present

## 2022-11-29 DIAGNOSIS — I10 Essential (primary) hypertension: Secondary | ICD-10-CM | POA: Diagnosis not present

## 2022-11-29 DIAGNOSIS — Z780 Asymptomatic menopausal state: Secondary | ICD-10-CM | POA: Diagnosis not present

## 2022-11-30 ENCOUNTER — Other Ambulatory Visit: Payer: Self-pay

## 2022-11-30 ENCOUNTER — Other Ambulatory Visit (HOSPITAL_BASED_OUTPATIENT_CLINIC_OR_DEPARTMENT_OTHER): Payer: Self-pay

## 2022-12-01 ENCOUNTER — Other Ambulatory Visit (HOSPITAL_BASED_OUTPATIENT_CLINIC_OR_DEPARTMENT_OTHER): Payer: Self-pay

## 2022-12-02 ENCOUNTER — Other Ambulatory Visit (HOSPITAL_BASED_OUTPATIENT_CLINIC_OR_DEPARTMENT_OTHER): Payer: Self-pay

## 2022-12-05 ENCOUNTER — Other Ambulatory Visit (HOSPITAL_BASED_OUTPATIENT_CLINIC_OR_DEPARTMENT_OTHER): Payer: Self-pay

## 2022-12-09 ENCOUNTER — Other Ambulatory Visit (HOSPITAL_BASED_OUTPATIENT_CLINIC_OR_DEPARTMENT_OTHER): Payer: Self-pay

## 2022-12-09 DIAGNOSIS — S20211A Contusion of right front wall of thorax, initial encounter: Secondary | ICD-10-CM | POA: Diagnosis not present

## 2022-12-09 DIAGNOSIS — R0781 Pleurodynia: Secondary | ICD-10-CM | POA: Diagnosis not present

## 2022-12-10 ENCOUNTER — Other Ambulatory Visit: Payer: Self-pay

## 2022-12-13 ENCOUNTER — Other Ambulatory Visit (HOSPITAL_COMMUNITY): Payer: Self-pay

## 2022-12-14 DIAGNOSIS — N644 Mastodynia: Secondary | ICD-10-CM | POA: Diagnosis not present

## 2022-12-14 DIAGNOSIS — N3941 Urge incontinence: Secondary | ICD-10-CM | POA: Diagnosis not present

## 2022-12-14 DIAGNOSIS — R350 Frequency of micturition: Secondary | ICD-10-CM | POA: Diagnosis not present

## 2022-12-15 ENCOUNTER — Other Ambulatory Visit (HOSPITAL_BASED_OUTPATIENT_CLINIC_OR_DEPARTMENT_OTHER): Payer: Self-pay

## 2022-12-15 DIAGNOSIS — W19XXXA Unspecified fall, initial encounter: Secondary | ICD-10-CM | POA: Diagnosis not present

## 2022-12-15 DIAGNOSIS — R0781 Pleurodynia: Secondary | ICD-10-CM | POA: Diagnosis not present

## 2022-12-15 MED ORDER — LIDOCAINE 5 % EX PTCH
2.0000 | MEDICATED_PATCH | Freq: Every day | CUTANEOUS | 0 refills | Status: DC
  Filled 2022-12-15: qty 30, 15d supply, fill #0

## 2022-12-15 MED ORDER — PREDNISONE 10 MG PO TABS
ORAL_TABLET | ORAL | 0 refills | Status: DC
  Filled 2022-12-15: qty 21, 6d supply, fill #0

## 2022-12-19 ENCOUNTER — Other Ambulatory Visit (HOSPITAL_BASED_OUTPATIENT_CLINIC_OR_DEPARTMENT_OTHER): Payer: Self-pay

## 2022-12-19 ENCOUNTER — Other Ambulatory Visit (HOSPITAL_COMMUNITY): Payer: Self-pay

## 2022-12-21 ENCOUNTER — Ambulatory Visit (INDEPENDENT_AMBULATORY_CARE_PROVIDER_SITE_OTHER): Payer: Commercial Managed Care - PPO | Admitting: Family Medicine

## 2022-12-21 ENCOUNTER — Other Ambulatory Visit (HOSPITAL_BASED_OUTPATIENT_CLINIC_OR_DEPARTMENT_OTHER): Payer: Self-pay

## 2022-12-21 ENCOUNTER — Ambulatory Visit: Payer: Commercial Managed Care - PPO | Admitting: Internal Medicine

## 2022-12-21 ENCOUNTER — Other Ambulatory Visit: Payer: Self-pay

## 2022-12-21 DIAGNOSIS — N3941 Urge incontinence: Secondary | ICD-10-CM | POA: Diagnosis not present

## 2022-12-21 DIAGNOSIS — R350 Frequency of micturition: Secondary | ICD-10-CM | POA: Diagnosis not present

## 2022-12-22 ENCOUNTER — Other Ambulatory Visit (HOSPITAL_BASED_OUTPATIENT_CLINIC_OR_DEPARTMENT_OTHER): Payer: Self-pay

## 2022-12-22 ENCOUNTER — Other Ambulatory Visit: Payer: Self-pay

## 2022-12-22 MED ORDER — INFLUENZA VAC A&B SURF ANT ADJ 0.5 ML IM SUSY
0.5000 mL | PREFILLED_SYRINGE | Freq: Once | INTRAMUSCULAR | 0 refills | Status: AC
  Filled 2022-12-22: qty 0.5, 1d supply, fill #0

## 2022-12-23 ENCOUNTER — Other Ambulatory Visit (HOSPITAL_BASED_OUTPATIENT_CLINIC_OR_DEPARTMENT_OTHER): Payer: Self-pay

## 2022-12-23 ENCOUNTER — Other Ambulatory Visit: Payer: Self-pay

## 2022-12-24 ENCOUNTER — Encounter (HOSPITAL_COMMUNITY): Payer: Self-pay

## 2022-12-26 ENCOUNTER — Ambulatory Visit: Payer: Commercial Managed Care - PPO | Admitting: Internal Medicine

## 2022-12-26 ENCOUNTER — Ambulatory Visit: Payer: Commercial Managed Care - PPO | Attending: Family Medicine | Admitting: Pharmacist

## 2022-12-26 ENCOUNTER — Other Ambulatory Visit: Payer: Self-pay

## 2022-12-26 DIAGNOSIS — Z79899 Other long term (current) drug therapy: Secondary | ICD-10-CM

## 2022-12-26 NOTE — Progress Notes (Signed)
S: Patient presents for review of their specialty medication therapy.  Patient is currently taking Xolair for asthma. Patient is managed by NP Nehemiah Settle for this.   Adherence: confirmed.   Efficacy: reports that Xolair is working well for her.   Dosing: Give subcutaneously.  Can be dosed every 2 or 4 weeks based on baseline serum IgE levels and body weight.  Asthma: SubQ: Dose and frequency based on body weight and pretreatment total IgE serum levels. Dosing should be adjusted during therapy for significant changes in body weight. Dosing should not be adjusted based on total IgE levels taken during treatment or <1 year following interruption of therapy. If therapy has been interrupted for >=1 year, total IgE levels may be re-evaluated for dosage determination.  Pretreatment serum IgE >=30 to 100 units/mL:   >90 to 150 kg: 300 mg every 4 weeks  Dose adjustments: Renal: no dose adjustments  Hepatic: no dose adjustments  Toxicity: Severe hypersensitivity reaction or anaphylaxis: Discontinue treatment. Fever, arthralgia, and rash: Discontinue treatment if this constellation of symptoms occurs.  Drug-drug interactions: none identified   Monitoring: CV effects: none Eosinophilia and vasculitis: none Fever/arthralgia/rash: none Hypersensitivity/Anaphylaxis: none Malignant neoplasms: none Pulmonary function tests (for asthma): none  O: Lab Results  Component Value Date   WBC 6.9 08/27/2022   HGB 11.9 (L) 08/27/2022   HCT 34.9 (L) 08/27/2022   MCV 98.6 08/27/2022   PLT 193 08/27/2022      Chemistry      Component Value Date/Time   NA 128 (L) 08/27/2022 2108   NA 138 04/05/2022 0942   NA 142 01/12/2017 0914   K 3.9 08/27/2022 2108   K 4.2 01/12/2017 0914   CL 96 (L) 08/27/2022 2108   CO2 24 08/27/2022 2108   CO2 28 01/12/2017 0914   BUN 16 08/27/2022 2108   BUN 19 04/05/2022 0942   BUN 12.0 01/12/2017 0914   CREATININE 1.26 (H) 08/27/2022 2108   CREATININE 1.58  (H) 10/12/2018 1121   CREATININE 1.3 (H) 01/12/2017 0914      Component Value Date/Time   CALCIUM 8.7 (L) 08/27/2022 2108   CALCIUM 9.6 01/12/2017 0914   ALKPHOS 47 08/27/2022 2108   ALKPHOS 43 01/12/2017 0914   AST 18 08/27/2022 2108   AST 16 10/12/2018 1121   AST 19 01/12/2017 0914   ALT 5 08/27/2022 2108   ALT <6 10/12/2018 1121   ALT 18 01/12/2017 0914   BILITOT 0.4 08/27/2022 2108   BILITOT 0.3 04/05/2022 0942   BILITOT 0.2 (L) 10/12/2018 1121   BILITOT 0.28 01/12/2017 0914     A/P: 1. Medication review: patient currently on Xolair for asthma. Reviewed the medication with the patient, including the following: Xolair, omalizumab, is a novel IgE blocker.  It appears to reduce rates of hospitalizations, ER visits and unscheduled physician visits due asthma exacerbations when added to standard therapy.  Studies also show a reduction in steroid requirements and improvement in quality of life.  Patient educated on purpose, proper use and potential adverse effects of Xolair.  Following instruction patient verbalized understanding. Patient should always have an EpiPen readily available in the event of anaphylaxis. SubQ: For SubQ injection only; doses >150 mg should be divided over more than one injection site (eg, 225 mg or 300 mg administered as two injections, 375 mg administered as three injections); each injection site should be separated by >=1 inch. Do not inject into moles, scars, bruises, tender areas, or broken skin. Injections may take  5 to 10 seconds to administer (solution is slightly viscous). Administer only under direct medical supervision and observe patient for 2 hours after the first 3 injections and 30 minutes after subsequent injections Angela Nevin 2015) or in accordance with individual institution policies and procedures.No recommendations for any changes at this time.   Butch Penny, PharmD, Patsy [place], CPP Clinical Pharmacist [facility] [phone]

## 2022-12-27 ENCOUNTER — Encounter: Payer: Self-pay | Admitting: Internal Medicine

## 2022-12-27 ENCOUNTER — Other Ambulatory Visit (HOSPITAL_BASED_OUTPATIENT_CLINIC_OR_DEPARTMENT_OTHER): Payer: Self-pay

## 2022-12-27 ENCOUNTER — Ambulatory Visit: Payer: Commercial Managed Care - PPO | Admitting: Internal Medicine

## 2022-12-27 VITALS — BP 108/60 | HR 77 | Temp 97.3°F | Resp 16 | Wt 200.5 lb

## 2022-12-27 DIAGNOSIS — K219 Gastro-esophageal reflux disease without esophagitis: Secondary | ICD-10-CM | POA: Diagnosis not present

## 2022-12-27 DIAGNOSIS — J3089 Other allergic rhinitis: Secondary | ICD-10-CM | POA: Diagnosis not present

## 2022-12-27 DIAGNOSIS — K220 Achalasia of cardia: Secondary | ICD-10-CM

## 2022-12-27 DIAGNOSIS — J4540 Moderate persistent asthma, uncomplicated: Secondary | ICD-10-CM | POA: Diagnosis not present

## 2022-12-27 NOTE — Progress Notes (Signed)
Follow Up Note  RE: Deanna Rowe MRN: [id number] DOB: 30-Dec-1955 Date of Office Visit: 12/27/2022  Referring provider: Clayborn Heron, MD Primary care provider: Clayborn Heron, MD  Chief Complaint: Follow-up (Pt states that she have doing well, she's still on her Xolair and have noticed improvements I he breathing with them.) and Asthma  History of Present Illness: I had the pleasure of seeing Deanna Rowe for a follow up visit at the [facility] on 12/27/2022. She is a 67 y.o. female, who is being followed for persistent asthma, allergic rhinitis, reflux allergic conjunctivitis . Her previous allergy office visit was on  3/18/24with Dr. Marlynn Perking. Today is a regular follow up visit.  History obtained from patient, chart review.    Today they report that overall all of her allergy symptoms and asthma symptoms are much improved   She continues with Symbicort 160 mcg 2 puffs once daily at night.  Tolerating Xolair 300 mg every 4 weeks well.  Not needing any albuterol. No ABX/OCS/ED visits since last visit.   In regards to rhinitis symptoms she is well-controlled. She is not needing any medications, but has nasacort, astelin and xyzal at home.   Reflux is improved since getting a bed where the head is elevated.  She is not taking her famotidine or omeprazole.  Assessment and Plan: Sayana is a 67 y.o. female with: Moderate persistent asthma without complication  Perennial allergic rhinitis  Gastroesophageal reflux disease without esophagitis  Achalasia Plan: Patient Instructions  Asthma: well controlled  Continue Symbicort 160/4.5 mcg to-2 puffs twice a day with a spacer as needed prevent cough or wheeze.   Continue albuterol 2 puffs every 4 hours as needed for cough, wheeze, tightness in chest, or shortness of breath Continue Xolair injections 300 mg once every 4 weeks and have access to an epinephrine auto-injector set.    Allergic rhinitis:  well controlled; not needing any medications, but can use them as below as needed  Continue Nasacort 2 sprays in each nostril once a day as needed for stuffy nose Continue saline nasal rinses as needed for nasal symptoms. Use this before any medicated nasal sprays for best result Continue azelastine nasal spray 2 sprays in each nostril twice a day as needed for drainage down throat/runny nose May use Xyzal 5 mg once a day only as needed for a runny nose or itch Continue allergen avoidance measures directed toward dust mites, cat, and dog as listed below   Reflux: improved  Continue dietary and lifestyle modifications Please let us know if this treatment plan is not working well for you  Follow up: 6 month  Thank you so much for letting me partake in your care today.  Don't hesitate to reach out if you have any additional concerns!  Ferol Luz, MD  [facility] [place] [place]      No follow-ups on file.  No orders of the defined types were placed in this encounter.   Lab Orders  No laboratory test(s) ordered today   Diagnostics: Spirometry:  Tracings reviewed. Her effort: Good reproducible efforts. FVC: 2.61 L FEV1: 1.97 L, 94% predicted FEV1/FVC ratio: 75% Interpretation: Spirometry consistent with normal pattern.  Please see scanned spirometry results for details.    Medication List:  Current Outpatient Medications  Medication Sig Dispense Refill   ACCU-CHEK FASTCLIX LANCETS MISC USE TO CHECK BLOOD SUGAR 3 TIMES A DAY 102 each 0   ACCU-CHEK GUIDE test strip  USE TO CHECK BLOOD SUGAR 3 TIMES PER DAY. (Patient taking differently: Use to check blood sugar 4 times per day.) 100 each 2   albuterol (VENTOLIN HFA) 108 (90 Base) MCG/ACT inhaler Inhale 2 puffs into the lungs as needed for wheezing or shortness of breath. 18 g 2   amLODipine (NORVASC) 5 MG tablet Take 1 tablet (5 mg total) by mouth daily. 90 tablet 1   aspirin EC 81 MG tablet Take 81 mg by  mouth every evening.     atorvastatin (LIPITOR) 10 MG tablet TAKE 1 TABLET BY MOUTH AT BEDTIME 90 tablet 3   azelastine (ASTELIN) 0.1 % nasal spray Place 2 sprays into both nostrils 2 (two) times daily as needed. 30 mL 5   brexpiprazole (REXULTI) 2 MG TABS tablet Take 1 tablet (2 mg total) by mouth every morning. 90 tablet 1   buPROPion (WELLBUTRIN XL) 150 MG 24 hr tablet Take 3 tablets (450 mg total) by mouth in the morning. 270 tablet 1   Calcium Carbonate-Vitamin D 600-400 MG-UNIT tablet Take 1 tablet by mouth daily.     carbidopa-levodopa (SINEMET CR) 50-200 MG tablet Take 1 tablet by mouth 5 (five) times daily. 450 tablet 3   carbidopa-levodopa (SINEMET) 25-250 MG tablet Take 1 tablet by mouth every morning AND 1 tablet daily in the afternoon at 2 pm. 180 tablet 1   Continuous Blood Gluc Receiver (DEXCOM G7 RECEIVER) DEVI Use as directed to check blood sugar 1 each 1   Continuous Blood Gluc Sensor (DEXCOM G7 SENSOR) MISC Use to check blood sugar and change sensor every 10 days 9 each 4   dapagliflozin propanediol (FARXIGA) 5 MG TABS tablet Take 1 tablet (5 mg total) by mouth daily. 90 tablet 4   EPINEPHrine (EPIPEN 2-PAK) 0.3 mg/0.3 mL IJ SOAJ injection Inject 0.3 mg into the muscle as needed for anaphylaxis. 2 each 1   gabapentin (NEURONTIN) 300 MG capsule Take 1 capsule (300 mg total) by mouth 3 (three) times daily. 270 capsule 0   glucose blood test strip Use to check blood sugar 4 times daily 400 each 3   HYDROcodone-acetaminophen (NORCO/VICODIN) 5-325 MG tablet Take 1 tablet by mouth every 6 (six) hours as needed for 10 days. 40 tablet 0   insulin degludec (TRESIBA FLEXTOUCH) 100 UNIT/ML FlexTouch Pen INJECT UNDER THE SKIN ONCE DAILY. TRITRATE AS DIRECTED WITH A MAXIMUM DAILY DOSE OF 50 UNITS     insulin lispro (HUMALOG) 100 UNIT/ML injection SQ - 14 units at breakfast, 12 units at lunch, 16 units at supper 10 mL 0   Insulin Pen Needle (TECHLITE PEN NEEDLES) 32G X 6 MM MISC use to Inject  insulin into the skin 4 (four) times daily. 400 each 0   Insulin Pen Needle (TECHLITE PEN NEEDLES) 32G X 6 MM MISC Use to inject insulin 4 (four) times daily. 400 each 0   lidocaine (LIDODERM) 5 % Place 2 patches onto the skin daily and remove after 12 hours. Apply for 15 days . 30 patch 0   LORazepam (ATIVAN) 2 MG tablet Take 1 tablet (2 mg total) by mouth 3 (three) times daily. 270 tablet 1   losartan (COZAAR) 100 MG tablet Take 1 tablet (100 mg total) by mouth daily. 90 tablet 3   mirabegron ER (MYRBETRIQ) 50 MG TB24 tablet Take 1 tablet (50 mg total) by mouth daily. 90 tablet 3   mirabegron ER (MYRBETRIQ) 50 MG TB24 tablet Take 1 tablet (50 mg total) by mouth daily. 90  tablet 3   Olopatadine HCl (PATADAY) 0.2 % SOLN Place 1 drop into both eyes daily as needed. 2.5 mL 5   omalizumab (XOLAIR) 150 MG/ML prefilled syringe Inject 300 mg into the skin every 28 (twenty-eight) days. 2 mL 11   propranolol (INDERAL) 40 MG tablet Take 1 tablet (40 mg total) by mouth 2 (two) times daily. 60 tablet 11   rOPINIRole (REQUIP) 2 MG tablet Take 1 tablet (2 mg total) by mouth 2 (two) times daily. 360 tablet 1   solifenacin (VESICARE) 10 MG tablet Take 1 tablet (10 mg total) by mouth daily. 30 tablet 11   tirzepatide (MOUNJARO) 15 MG/0.5ML Pen Inject 15 mg into the skin once a week. 6 mL 0   triamcinolone (NASACORT) 55 MCG/ACT AERO nasal inhaler Place 2 sprays into the nose daily. 16.9 mL 5   valACYclovir (VALTREX) 500 MG tablet Take 1 tablet (500 mg total) by mouth daily. 90 tablet 4   vitamin C (ASCORBIC ACID) 500 MG tablet Take 500 mg by mouth daily.     Vitamin D, Ergocalciferol, 2000 units CAPS Take 1 capsule by mouth daily. 2000 units daily.     vortioxetine HBr (TRINTELLIX) 20 MG TABS tablet Take 1 tablet (20 mg total) by mouth every morning. 90 tablet 1   zolpidem (AMBIEN) 5 MG tablet Take 1 tablet (5 mg total) by mouth at bedtime. 90 tablet 1   No current facility-administered medications for this  visit.   Facility-Administered Medications Ordered in Other Visits  Medication Dose Route Frequency Provider Last Rate Last Admin   tranexamic acid (CYKLOKAPRON) topical -INTRAOP  2,000 mg Topical Once [place], [facility], PA-C       Allergies: Allergies  Allergen Reactions   Fluticasone-Salmeterol Anaphylaxis   Codeine Nausea And Vomiting   Fetzima [Levomilnacipran] Nausea And Vomiting   Nsaids Other (See Comments)    Upset stomach    Trulicity [Dulaglutide] Nausea And Vomiting    Significant and undesirably rapid (40 lbs) weight loss    Xanax [Alprazolam] Other (See Comments)    disoriented   Amoxicillin Rash    Has patient had a PCN reaction causing immediate rash, facial/tongue/throat swelling, SOB or lightheadedness with hypotension: Yes Has patient had a PCN reaction causing severe rash involving mucus membranes or skin necrosis: No Has patient had a PCN reaction that required hospitalization: No Has patient had a PCN reaction occurring within the last 10 years: No If all of the above answers are "NO", then may proceed with Cephalosporin use.    Pseudoephedrine Hcl Er Palpitations   I reviewed her past medical history, social history, family history, and environmental history and no significant changes have been reported from her previous visit.  ROS: All others negative except as noted per HPI.   Objective: BP 108/60   Pulse 77   Temp (!) 97.3 F (36.3 C) (Temporal)   Resp 16   Wt 200 lb 8 oz (90.9 kg)   SpO2 96%   BMI 32.36 kg/m  Body mass index is 32.36 kg/m. General Appearance:  Alert, cooperative, no distress, appears stated age  Head:  Normocephalic, without obvious abnormality, atraumatic  Eyes:  Conjunctiva clear, EOM's intact  Nose: Nares normal, normal mucosa, no visible anterior polyps, and septum midline  Throat: Lips, tongue normal; teeth and gums normal, normal posterior oropharynx and no tonsillar exudate  Neck: Supple, symmetrical  Lungs:   clear  to auscultation bilaterally, Respirations unlabored, no coughing  Heart:  regular rate and rhythm and  no murmur, Appears well perfused  Extremities: No edema  Skin: Skin color, texture, turgor normal, no rashes or lesions on visualized portions of skin   Neurologic: No gross deficits   Previous notes and tests were reviewed. The plan was reviewed with the patient/family, and all questions/concerned were addressed.  It was my pleasure to see Mckinney today and participate in her care. Please feel free to contact me with any questions or concerns.  Sincerely,  Ferol Luz, MD  Allergy & Immunology  [facility] Office: [phone]

## 2022-12-27 NOTE — Patient Instructions (Addendum)
Asthma: well controlled  Continue Symbicort 160/4.5 mcg to-2 puffs twice a day with a spacer as needed prevent cough or wheeze.   Continue albuterol 2 puffs every 4 hours as needed for cough, wheeze, tightness in chest, or shortness of breath Continue Xolair injections 300 mg once every 4 weeks and have access to an epinephrine auto-injector set.    Allergic rhinitis: well controlled; not needing any medications, but can use them as below as needed  Continue Nasacort 2 sprays in each nostril once a day as needed for stuffy nose Continue saline nasal rinses as needed for nasal symptoms. Use this before any medicated nasal sprays for best result Continue azelastine nasal spray 2 sprays in each nostril twice a day as needed for drainage down throat/runny nose May use Xyzal 5 mg once a day only as needed for a runny nose or itch Continue allergen avoidance measures directed toward dust mites, cat, and dog as listed below   Reflux: improved  Continue dietary and lifestyle modifications Please let us know if this treatment plan is not working well for you  Follow up: 6 month  Thank you so much for letting me partake in your care today.  Don't hesitate to reach out if you have any additional concerns!  Ferol Luz, MD  [facility] [place] [place]

## 2022-12-28 ENCOUNTER — Other Ambulatory Visit (HOSPITAL_BASED_OUTPATIENT_CLINIC_OR_DEPARTMENT_OTHER): Payer: Self-pay

## 2022-12-29 ENCOUNTER — Other Ambulatory Visit (HOSPITAL_BASED_OUTPATIENT_CLINIC_OR_DEPARTMENT_OTHER): Payer: Self-pay

## 2022-12-30 ENCOUNTER — Other Ambulatory Visit (HOSPITAL_BASED_OUTPATIENT_CLINIC_OR_DEPARTMENT_OTHER): Payer: Self-pay

## 2022-12-31 DIAGNOSIS — Z794 Long term (current) use of insulin: Secondary | ICD-10-CM | POA: Diagnosis not present

## 2022-12-31 DIAGNOSIS — E1165 Type 2 diabetes mellitus with hyperglycemia: Secondary | ICD-10-CM | POA: Diagnosis not present

## 2023-01-02 ENCOUNTER — Other Ambulatory Visit: Payer: Self-pay

## 2023-01-02 ENCOUNTER — Other Ambulatory Visit (HOSPITAL_BASED_OUTPATIENT_CLINIC_OR_DEPARTMENT_OTHER): Payer: Self-pay

## 2023-01-09 ENCOUNTER — Other Ambulatory Visit (HOSPITAL_BASED_OUTPATIENT_CLINIC_OR_DEPARTMENT_OTHER): Payer: Self-pay

## 2023-01-13 ENCOUNTER — Other Ambulatory Visit: Payer: Self-pay

## 2023-01-13 ENCOUNTER — Other Ambulatory Visit (HOSPITAL_COMMUNITY): Payer: Self-pay

## 2023-01-13 ENCOUNTER — Other Ambulatory Visit (HOSPITAL_COMMUNITY): Payer: Self-pay | Admitting: Pharmacy Technician

## 2023-01-13 NOTE — Progress Notes (Signed)
Specialty Pharmacy Refill Coordination Note  Deanna Rowe is a 67 y.o. female contacted today regarding refills of specialty medication(s) Omalizumab   Patient requested [name] at [facility] at [place] date: 01/23/23   Medication will be filled on 01/19/23.

## 2023-01-16 ENCOUNTER — Other Ambulatory Visit (HOSPITAL_BASED_OUTPATIENT_CLINIC_OR_DEPARTMENT_OTHER): Payer: Self-pay

## 2023-01-16 ENCOUNTER — Other Ambulatory Visit: Payer: Self-pay

## 2023-01-16 MED ORDER — LOSARTAN POTASSIUM 100 MG PO TABS
100.0000 mg | ORAL_TABLET | Freq: Every day | ORAL | 3 refills | Status: DC
  Filled 2023-01-16: qty 90, 90d supply, fill #0
  Filled 2023-04-14: qty 90, 90d supply, fill #1

## 2023-01-17 ENCOUNTER — Other Ambulatory Visit (HOSPITAL_BASED_OUTPATIENT_CLINIC_OR_DEPARTMENT_OTHER): Payer: Self-pay

## 2023-01-23 ENCOUNTER — Other Ambulatory Visit (HOSPITAL_BASED_OUTPATIENT_CLINIC_OR_DEPARTMENT_OTHER): Payer: Self-pay

## 2023-01-23 ENCOUNTER — Other Ambulatory Visit: Payer: Self-pay

## 2023-01-25 ENCOUNTER — Other Ambulatory Visit (HOSPITAL_BASED_OUTPATIENT_CLINIC_OR_DEPARTMENT_OTHER): Payer: Self-pay

## 2023-01-25 ENCOUNTER — Ambulatory Visit (INDEPENDENT_AMBULATORY_CARE_PROVIDER_SITE_OTHER): Payer: Commercial Managed Care - PPO | Admitting: Family Medicine

## 2023-01-25 ENCOUNTER — Encounter (INDEPENDENT_AMBULATORY_CARE_PROVIDER_SITE_OTHER): Payer: Self-pay | Admitting: Family Medicine

## 2023-01-25 VITALS — BP 106/72 | HR 69 | Temp 98.0°F | Ht 66.0 in | Wt 194.0 lb

## 2023-01-25 DIAGNOSIS — E11649 Type 2 diabetes mellitus with hypoglycemia without coma: Secondary | ICD-10-CM | POA: Diagnosis not present

## 2023-01-25 DIAGNOSIS — E669 Obesity, unspecified: Secondary | ICD-10-CM

## 2023-01-25 DIAGNOSIS — Z7985 Long-term (current) use of injectable non-insulin antidiabetic drugs: Secondary | ICD-10-CM | POA: Diagnosis not present

## 2023-01-25 DIAGNOSIS — Z6831 Body mass index (BMI) 31.0-31.9, adult: Secondary | ICD-10-CM | POA: Diagnosis not present

## 2023-01-25 DIAGNOSIS — Z7984 Long term (current) use of oral hypoglycemic drugs: Secondary | ICD-10-CM

## 2023-01-25 DIAGNOSIS — E66812 Obesity, class 2: Secondary | ICD-10-CM

## 2023-01-25 DIAGNOSIS — Z794 Long term (current) use of insulin: Secondary | ICD-10-CM | POA: Diagnosis not present

## 2023-01-25 MED ORDER — TIRZEPATIDE 15 MG/0.5ML ~~LOC~~ SOAJ
15.0000 mg | SUBCUTANEOUS | 0 refills | Status: DC
Start: 2023-01-25 — End: 2023-03-13
  Filled 2023-01-25: qty 6, 84d supply, fill #0

## 2023-01-25 NOTE — Progress Notes (Signed)
.[facility]  Office: [phone]  /  Fax: [phone]  WEIGHT SUMMARY AND BIOMETRICS  Anthropometric Measurements Height: 5\' 6"  (1.676 m) Weight: 194 lb (88 kg) BMI (Calculated): 31.33 Weight at Last Visit: 209 lb Weight Lost Since Last Visit: 15 lb Weight Gained Since Last Visit: 0 Starting Weight: 237 lb Total Weight Loss (lbs): 43 lb (19.5 kg) Peak Weight: 237 lb   Body Composition  Body Fat %: 42.7 % Fat Mass (lbs): 83.2 lbs Muscle Mass (lbs): 106 lbs Total Body Water (lbs): 72.6 lbs Visceral Fat Rating : 12   Other Clinical Data Fasting: no Labs: no Today's Visit #: 48 Starting Date: 04/16/19    Chief Complaint: OBESITY   History of Present Illness   The patient, with a history of obesity, diabetes, and Parkinson's disease, presents for a follow-up visit to discuss her weight loss treatment plans. She reports a significant weight loss of fifteen pounds over the last two months, attributing this to adherence to a category three eating plan and regular exercise. She has been exercising for sixty minutes three times per week and following the eating plan approximately fifty percent of the time.  The patient reports a diminished appetite for sugar and denies feeling hungry. She has been consuming all the food recommended in her eating plan, including adequate protein. She has been managing meal planning and prepping effectively, although she acknowledges occasional challenges.  The patient's blood sugars have been generally well-controlled, but she has experienced episodes of hypoglycemia, particularly at night. She describes feeling weak during these episodes, with blood sugars dropping as low as 45. She reports difficulty managing these episodes due to uncertainty about what to eat when her blood sugar is low.  The patient's current diabetes medications include Mounjaro and Farxiga, having been taken off [place] and Humalog a month ago. She reports that the frequency of  hypoglycemic episodes has increased since discontinuing insulin. She has been managing these episodes with glucose tabs and is considering keeping cheese sticks or other protein-rich snacks on hand for emergencies.  The patient continues to participate in Parkinson's exercises and enjoys both the physical activity and the social aspect of these sessions. She reports feeling good overall and is not experiencing any lightheadedness. She is also on a blood pressure medication regimen, which has been effective in maintaining a healthy blood pressure.          PHYSICAL EXAM:  Blood pressure 106/72, pulse 69, temperature 98 F (36.7 C), height 5\' 6"  (1.676 m), weight 194 lb (88 kg), SpO2 97%. Body mass index is 31.31 kg/m.  DIAGNOSTIC DATA REVIEWED:  BMET    Component Value Date/Time   NA 128 (L) 08/27/2022 2108   NA 138 04/05/2022 0942   NA 142 01/12/2017 0914   K 3.9 08/27/2022 2108   K 4.2 01/12/2017 0914   CL 96 (L) 08/27/2022 2108   CO2 24 08/27/2022 2108   CO2 28 01/12/2017 0914   GLUCOSE 84 08/27/2022 2108   GLUCOSE 142 (H) 01/12/2017 0914   BUN 16 08/27/2022 2108   BUN 19 04/05/2022 0942   BUN 12.0 01/12/2017 0914   CREATININE 1.26 (H) 08/27/2022 2108   CREATININE 1.58 (H) 10/12/2018 1121   CREATININE 1.3 (H) 01/12/2017 0914   CALCIUM 8.7 (L) 08/27/2022 2108   CALCIUM 9.6 01/12/2017 0914   GFRNONAA 47 (L) 08/27/2022 2108   GFRNONAA 34 (L) 10/12/2018 1121   GFRAA 44 (L) 03/02/2020 0920   GFRAA 40 (L) 10/12/2018 1121   Lab  Results  Component Value Date   HGBA1C 7.9 (H) 04/16/2019   HGBA1C 7.3 09/04/2014   Lab Results  Component Value Date   INSULIN 12.3 04/16/2019   Lab Results  Component Value Date   TSH 1.200 04/05/2022   CBC    Component Value Date/Time   WBC 6.9 08/27/2022 2108   RBC 3.54 (L) 08/27/2022 2108   HGB 11.9 (L) 08/27/2022 2108   HGB 12.6 04/05/2022 0942   HGB 11.4 (L) 01/12/2017 0913   HCT 34.9 (L) 08/27/2022 2108   HCT 36.0 04/05/2022  0942   HCT 35.2 01/12/2017 0913   PLT 193 08/27/2022 2108   PLT 255 04/05/2022 0942   MCV 98.6 08/27/2022 2108   MCV 98 (H) 04/05/2022 0942   MCV 100.9 01/12/2017 0913   MCH 33.6 08/27/2022 2108   MCHC 34.1 08/27/2022 2108   RDW 12.7 08/27/2022 2108   RDW 12.4 04/05/2022 0942   RDW 14.1 01/12/2017 0913   Iron Studies No results found for: "IRON", "TIBC", "FERRITIN", "IRONPCTSAT" Lipid Panel     Component Value Date/Time   CHOL 155 04/16/2019 1451   TRIG 71 04/16/2019 1451   HDL 84 04/16/2019 1451   LDLCALC 57 04/16/2019 1451   Hepatic Function Panel     Component Value Date/Time   PROT 7.2 08/27/2022 2108   PROT 7.1 04/05/2022 0942   PROT 7.2 01/12/2017 0914   ALBUMIN 3.5 08/27/2022 2108   ALBUMIN 4.3 04/05/2022 0942   ALBUMIN 3.5 01/12/2017 0914   AST 18 08/27/2022 2108   AST 16 10/12/2018 1121   AST 19 01/12/2017 0914   ALT 5 08/27/2022 2108   ALT <6 10/12/2018 1121   ALT 18 01/12/2017 0914   ALKPHOS 47 08/27/2022 2108   ALKPHOS 43 01/12/2017 0914   BILITOT 0.4 08/27/2022 2108   BILITOT 0.3 04/05/2022 0942   BILITOT 0.2 (L) 10/12/2018 1121   BILITOT 0.28 01/12/2017 0914      Component Value Date/Time   TSH 1.200 04/05/2022 0942   Nutritional Lab Results  Component Value Date   VD25OH 44.8 12/17/2019   VD25OH 40.9 04/16/2019     Assessment and Plan    Obesity Significant weight loss achieved through adherence to category three eating plan and regular exercise. -Continue current category 3 eating plan and exercise regimen. -Plan for discussion of strategies for upcoming holiday season in next visit -avoid Halloween candy temptations  DM II with Hypoglycemia Episodes of low blood sugar, particularly at night, with one episode reaching 45. Currently on [place] for blood sugar management. -Ensure availability of emergency glucose sources (glucose tabs, saltine crackers) for episodes of hypoglycemia. -Implement a scheduled bedtime snack  (1-2 hundred calories, 10+ grams of protein) to prevent nocturnal hypoglycemia. -Continue Mounjaro and Farxiga as prescribed. -Refill Mounjaro for 90 days to prevent running low between visits.  Follow-up in 6 weeks to assess progress and discuss holiday strategies.       She was informed of the importance of frequent follow up visits to maximize her success with intensive lifestyle modifications for her multiple health conditions.    Quillian Quince, MD

## 2023-01-30 ENCOUNTER — Other Ambulatory Visit (HOSPITAL_BASED_OUTPATIENT_CLINIC_OR_DEPARTMENT_OTHER): Payer: Self-pay

## 2023-02-02 ENCOUNTER — Other Ambulatory Visit (HOSPITAL_BASED_OUTPATIENT_CLINIC_OR_DEPARTMENT_OTHER): Payer: Self-pay

## 2023-02-02 DIAGNOSIS — N3941 Urge incontinence: Secondary | ICD-10-CM | POA: Diagnosis not present

## 2023-02-02 DIAGNOSIS — R350 Frequency of micturition: Secondary | ICD-10-CM | POA: Diagnosis not present

## 2023-02-02 MED ORDER — OXYBUTYNIN CHLORIDE ER 10 MG PO TB24
10.0000 mg | ORAL_TABLET | Freq: Every day | ORAL | 11 refills | Status: AC
  Filled 2023-02-02 – 2023-02-07 (×2): qty 30, 30d supply, fill #0
  Filled 2023-03-06: qty 30, 30d supply, fill #1
  Filled 2023-04-04: qty 30, 30d supply, fill #2
  Filled 2023-05-04: qty 30, 30d supply, fill #3
  Filled 2023-06-01: qty 30, 30d supply, fill #4
  Filled 2023-07-03: qty 30, 30d supply, fill #5
  Filled 2023-07-31: qty 30, 30d supply, fill #6
  Filled 2023-08-28: qty 30, 30d supply, fill #7
  Filled 2023-09-25: qty 30, 30d supply, fill #8
  Filled 2023-10-23: qty 30, 30d supply, fill #9
  Filled 2023-11-20: qty 30, 30d supply, fill #10
  Filled 2023-12-20: qty 30, 30d supply, fill #11

## 2023-02-03 ENCOUNTER — Other Ambulatory Visit (HOSPITAL_BASED_OUTPATIENT_CLINIC_OR_DEPARTMENT_OTHER): Payer: Self-pay

## 2023-02-03 ENCOUNTER — Other Ambulatory Visit: Payer: Self-pay

## 2023-02-06 ENCOUNTER — Other Ambulatory Visit (HOSPITAL_BASED_OUTPATIENT_CLINIC_OR_DEPARTMENT_OTHER): Payer: Self-pay

## 2023-02-06 MED ORDER — AMLODIPINE BESYLATE 5 MG PO TABS
5.0000 mg | ORAL_TABLET | Freq: Every day | ORAL | 1 refills | Status: DC
  Filled 2023-02-06: qty 90, 90d supply, fill #0
  Filled 2023-05-04: qty 90, 90d supply, fill #1

## 2023-02-07 ENCOUNTER — Other Ambulatory Visit: Payer: Self-pay

## 2023-02-07 ENCOUNTER — Other Ambulatory Visit (HOSPITAL_BASED_OUTPATIENT_CLINIC_OR_DEPARTMENT_OTHER): Payer: Self-pay

## 2023-02-08 ENCOUNTER — Other Ambulatory Visit: Payer: Self-pay

## 2023-02-10 ENCOUNTER — Other Ambulatory Visit: Payer: Self-pay

## 2023-02-13 ENCOUNTER — Other Ambulatory Visit (HOSPITAL_BASED_OUTPATIENT_CLINIC_OR_DEPARTMENT_OTHER): Payer: Self-pay

## 2023-02-13 ENCOUNTER — Other Ambulatory Visit: Payer: Self-pay

## 2023-02-13 DIAGNOSIS — H6121 Impacted cerumen, right ear: Secondary | ICD-10-CM | POA: Diagnosis not present

## 2023-02-13 DIAGNOSIS — H903 Sensorineural hearing loss, bilateral: Secondary | ICD-10-CM | POA: Diagnosis not present

## 2023-02-14 ENCOUNTER — Other Ambulatory Visit (HOSPITAL_BASED_OUTPATIENT_CLINIC_OR_DEPARTMENT_OTHER): Payer: Self-pay

## 2023-02-14 ENCOUNTER — Encounter (HOSPITAL_COMMUNITY): Payer: Self-pay

## 2023-02-14 ENCOUNTER — Other Ambulatory Visit: Payer: Self-pay

## 2023-02-14 MED ORDER — GABAPENTIN 300 MG PO CAPS
300.0000 mg | ORAL_CAPSULE | Freq: Three times a day (TID) | ORAL | 0 refills | Status: DC
  Filled 2023-02-14: qty 270, 90d supply, fill #0

## 2023-02-14 MED ORDER — COMIRNATY 30 MCG/0.3ML IM SUSY
PREFILLED_SYRINGE | INTRAMUSCULAR | 0 refills | Status: DC
  Filled 2023-02-14: qty 0.3, 1d supply, fill #0

## 2023-02-14 NOTE — Progress Notes (Signed)
Specialty Pharmacy Ongoing Clinical Assessment Note  Deanna Rowe is a 67 y.o. female who is being followed by the specialty pharmacy service for RxSp Asthma/COPD   Patient's specialty medication(s) reviewed today: Omalizumab   Missed doses in the last 4 weeks: 0   Patient/Caregiver did not have any additional questions or concerns.   Therapeutic benefit summary: Patient is achieving benefit   Adverse events/side effects summary: No adverse events/side effects   Patient's therapy is appropriate to: Continue    Goals Addressed             This Visit's Progress    Reduce disease symptoms including coughing and shortness of breath       Patient is on track. Patient will maintain adherence         Follow up:  6 months  Otto Herb Specialty Pharmacist

## 2023-02-14 NOTE — Progress Notes (Signed)
Specialty Pharmacy Refill Coordination Note  Deanna Rowe is a 67 y.o. female contacted today regarding refills of specialty medication(s) Omalizumab   Patient requested [name] at [facility] at [place] date: 02/22/23   Medication will be filled on 02/21/23.

## 2023-02-16 ENCOUNTER — Other Ambulatory Visit (HOSPITAL_BASED_OUTPATIENT_CLINIC_OR_DEPARTMENT_OTHER): Payer: Self-pay

## 2023-02-20 ENCOUNTER — Encounter: Payer: Self-pay | Admitting: Adult Health

## 2023-02-20 ENCOUNTER — Telehealth: Payer: Self-pay | Admitting: Adult Health

## 2023-02-20 NOTE — Telephone Encounter (Signed)
LVM and sent letter in mail informing pt of need to reschedule 03/14/23 appt - NP out

## 2023-02-21 ENCOUNTER — Other Ambulatory Visit (HOSPITAL_BASED_OUTPATIENT_CLINIC_OR_DEPARTMENT_OTHER): Payer: Self-pay

## 2023-02-21 ENCOUNTER — Other Ambulatory Visit (INDEPENDENT_AMBULATORY_CARE_PROVIDER_SITE_OTHER): Payer: Self-pay | Admitting: Family Medicine

## 2023-02-21 ENCOUNTER — Other Ambulatory Visit: Payer: Self-pay

## 2023-02-21 DIAGNOSIS — Z794 Long term (current) use of insulin: Secondary | ICD-10-CM

## 2023-02-22 ENCOUNTER — Other Ambulatory Visit (HOSPITAL_BASED_OUTPATIENT_CLINIC_OR_DEPARTMENT_OTHER): Payer: Self-pay

## 2023-02-22 DIAGNOSIS — E7800 Pure hypercholesterolemia, unspecified: Secondary | ICD-10-CM | POA: Diagnosis not present

## 2023-02-22 DIAGNOSIS — I10 Essential (primary) hypertension: Secondary | ICD-10-CM | POA: Diagnosis not present

## 2023-02-22 DIAGNOSIS — E1151 Type 2 diabetes mellitus with diabetic peripheral angiopathy without gangrene: Secondary | ICD-10-CM | POA: Diagnosis not present

## 2023-02-22 DIAGNOSIS — N1832 Chronic kidney disease, stage 3b: Secondary | ICD-10-CM | POA: Diagnosis not present

## 2023-02-22 DIAGNOSIS — K219 Gastro-esophageal reflux disease without esophagitis: Secondary | ICD-10-CM | POA: Diagnosis not present

## 2023-02-22 DIAGNOSIS — J4540 Moderate persistent asthma, uncomplicated: Secondary | ICD-10-CM | POA: Diagnosis not present

## 2023-02-22 DIAGNOSIS — E559 Vitamin D deficiency, unspecified: Secondary | ICD-10-CM | POA: Diagnosis not present

## 2023-02-22 DIAGNOSIS — E1122 Type 2 diabetes mellitus with diabetic chronic kidney disease: Secondary | ICD-10-CM | POA: Diagnosis not present

## 2023-02-22 DIAGNOSIS — Z0000 Encounter for general adult medical examination without abnormal findings: Secondary | ICD-10-CM | POA: Diagnosis not present

## 2023-02-22 DIAGNOSIS — N2581 Secondary hyperparathyroidism of renal origin: Secondary | ICD-10-CM | POA: Diagnosis not present

## 2023-02-22 LAB — COMPREHENSIVE METABOLIC PANEL WITH GFR
Albumin: 4.1 (ref 3.5–5.0)
Calcium: 9.4 (ref 8.7–10.7)
eGFR: 48

## 2023-02-22 LAB — CBC AND DIFFERENTIAL
HCT: 38 (ref 36–46)
Hemoglobin: 12.6 (ref 12.0–16.0)
Platelets: 224 K/uL (ref 150–400)
WBC: 5.5

## 2023-02-22 LAB — MICROALBUMIN, URINE: Microalb, Ur: <0.7

## 2023-02-22 LAB — HEPATIC FUNCTION PANEL
ALT: 5 U/L — AB (ref 7–35)
AST: 13 (ref 13–35)
Alkaline Phosphatase: 55 (ref 25–125)
Bilirubin, Total: 0.5

## 2023-02-22 LAB — BASIC METABOLIC PANEL WITH GFR
BUN: 15 (ref 4–21)
CO2: 29 — AB (ref 13–22)
Chloride: 101 (ref 99–108)
Creatinine: 1.2 — AB (ref 0.5–1.1)
Glucose: 82
Potassium: 4.7 meq/L (ref 3.5–5.1)
Sodium: 137 (ref 137–147)

## 2023-02-22 LAB — LIPID PANEL
Cholesterol: 144 (ref 0–200)
HDL: 73 — AB (ref 35–70)
Triglycerides: 65 (ref 40–160)

## 2023-02-22 LAB — PROTEIN / CREATININE RATIO, URINE: Creatinine, Urine: 46

## 2023-02-22 LAB — TSH: TSH: 1.19 (ref 0.41–5.90)

## 2023-02-22 LAB — HEMOGLOBIN A1C: Hemoglobin A1C: 6.1

## 2023-02-22 LAB — CBC: RBC: 3.7 — AB (ref 3.87–5.11)

## 2023-02-24 ENCOUNTER — Other Ambulatory Visit (HOSPITAL_COMMUNITY): Payer: Self-pay

## 2023-02-24 DIAGNOSIS — Z461 Encounter for fitting and adjustment of hearing aid: Secondary | ICD-10-CM | POA: Diagnosis not present

## 2023-02-24 DIAGNOSIS — H903 Sensorineural hearing loss, bilateral: Secondary | ICD-10-CM | POA: Diagnosis not present

## 2023-02-27 ENCOUNTER — Other Ambulatory Visit (HOSPITAL_BASED_OUTPATIENT_CLINIC_OR_DEPARTMENT_OTHER): Payer: Self-pay

## 2023-02-28 ENCOUNTER — Other Ambulatory Visit (HOSPITAL_BASED_OUTPATIENT_CLINIC_OR_DEPARTMENT_OTHER): Payer: Self-pay

## 2023-03-06 ENCOUNTER — Other Ambulatory Visit (HOSPITAL_BASED_OUTPATIENT_CLINIC_OR_DEPARTMENT_OTHER): Payer: Self-pay

## 2023-03-06 DIAGNOSIS — E042 Nontoxic multinodular goiter: Secondary | ICD-10-CM | POA: Diagnosis not present

## 2023-03-06 DIAGNOSIS — E1151 Type 2 diabetes mellitus with diabetic peripheral angiopathy without gangrene: Secondary | ICD-10-CM | POA: Diagnosis not present

## 2023-03-06 DIAGNOSIS — N1831 Chronic kidney disease, stage 3a: Secondary | ICD-10-CM | POA: Diagnosis not present

## 2023-03-06 DIAGNOSIS — E113299 Type 2 diabetes mellitus with mild nonproliferative diabetic retinopathy without macular edema, unspecified eye: Secondary | ICD-10-CM | POA: Diagnosis not present

## 2023-03-06 DIAGNOSIS — I700 Atherosclerosis of aorta: Secondary | ICD-10-CM | POA: Diagnosis not present

## 2023-03-09 NOTE — Progress Notes (Signed)
PATIENT: Deanna Rowe DOB: 04/20/1955  REASON FOR VISIT: follow up HISTORY FROM: patient PRIMARY NEUROLOGIST: Dr. Frances Furbish Sleep Neurologist: Dr. Vickey Huger  Chief Complaint  Patient presents with   Follow-up    Pt in 19, here  Pt is here for follow up on Parkinsons. Pt states she has fallen a few times, states her balance is off. States she uses a cane and at times she uses a walker.     HISTORY OF PRESENT ILLNESS: Today 03/13/23:  Deanna Rowe is a 67 y.o. female with a history of parkinsonism and obstructive sleep apnea. Returns today for follow-up.  Patient saw Dr. Arbutus Leas in July.  They discussed doing skin biopsies however the patient states that they were never contacted by the office regarding scheduling this.  Patient states that they plan to follow-up through our office.  She remains on Sinemet CR 5 times a day and Sinemet IR at 6 AM and 2 PM.  She states that this medication combination is working well for her.  She states that if she misses a dose or she is late her symptoms return and she is more prone to fall.  Denies a tremor.  Reports that she has trouble swallowing food and has to make sure that it is a small amount.  Denies any trouble sleeping.  She had a fall approximately 3 weeks ago.  No injuries other than scraping her arm.  12/02/21: Deanna Rowe is a 67 year old female with a history of parkinsonism and obstructive sleep apnea.  She returns today for follow-up.  She reports that in that last 2-3 weeks has had 5 falls. One episode she tripped over a stool, another time she was getting out of bed and her legs gave out. Denies tremor but husband reports that he has seen a tremor int he jaw. No trouble eating. Reports that she has trouble getting her last tablet down --thought she should be taking it 6 times. Sleeping ok. Not using CPAP- could not tolerate it reports that it made her cough.   06/07/21 Deanna Rowe is a 67 year old female with a history of parkinsonism and  obstructive sleep apnea on CPAP.  She returns today for follow-up.  She is here today with her husband.  She is currently on Sinemet CR 50-200 milligrams 5 times a day and Sinemet IR 25-250 mg twice a day.  She reports that this combination is working well for her.  Denies any significant tremor.  Reports sometimes her lips will quiver.  She reports over the last year she has had 3 falls.  Denies any significant changes in her gait and balance.  She uses a cane at all times.  She does have achalasia and reports some trouble swallowing liquids and solids.  In regards to sleep apnea she reports that she has not been able to use her machine consistently due to an ongoing cough.  She is currently following up with her allergy specialist regarding this.  HISTORY (copied from Dr. Teofilo Pod note) Deanna Rowe is a 68 year old right-handed woman with an underlying complex medical history of asthma, arthritis, anemia, avascular necrosis of the hip, depression, anxiety, type 2 diabetes, reflux disease, hyperlipidemia, hypertension, kidney disease, restless leg syndrome, history of TIA, thyroid disease, vitamin D deficiency, and obesity, who was diagnosed with parkinsonism several years ago.  She was noted to have right-sided signs.  She was on Abilify in the past.  She is no longer on Abilify for the past 2 years, but  she is on multiple psychotropic medications including lorazepam, bupropion, Rexulti, Trintellix, and also takes gabapentin as well as zolpidem.  She has clearly stopped the zolpidem about 3 weeks ago and is currently on melatonin 5 mg each night which has been helpful.  She is on ropinirole for restless leg syndrome per primary care, currently on 2 mg twice daily.  For parkinsonism she is on Sinemet CR, 50-200 mg strength 1 pill 5 times a day.  She last saw Dr. Anne Hahn on 08/26/2020, at which time she was advised to add Sinemet IR 25-250 mg strength, 1 pill twice daily, in the morning and at 2 PM daily.     She  has a history of urinary incontinence and is currently on Myrbetriq and Vesicare per urology.  She has chronic constipation for at least 1 year and has been on MiraLAX daily, and 2 stool softeners daily. She has been using a cane.  Since the last appointment, she has not fallen thankfully.  She believes that the addition of Sinemet IR has helped but she only takes 1 in the morning.   She had a brain MRI without contrast on 03/15/2017 and I reviewed the results:   IMPRESSION: Slightly abnormal MRI scan of the brain showing prominent changes of chronic microvascular ischemia.  No acute abnormalities noted.  Overall no significant change compared with prior MRI scan dated 05/23/2014.   For her sleep apnea, she is followed by Dr. Vickey Huger and the nurse practitioner.  She reports 50% compliance with her CPAP.  Sometimes acid reflux bothers her at night which bothers her CPAP usage.    REVIEW OF SYSTEMS: Out of a complete 14 system review of symptoms, the patient complains only of the following symptoms, and all other reviewed systems are negative.  ALLERGIES: Allergies  Allergen Reactions   Fluticasone-Salmeterol Anaphylaxis   Codeine Nausea And Vomiting   Fetzima [Levomilnacipran] Nausea And Vomiting   Nsaids Other (See Comments)    Upset stomach    Trulicity [Dulaglutide] Nausea And Vomiting    Significant and undesirably rapid (40 lbs) weight loss    Xanax [Alprazolam] Other (See Comments)    disoriented   Amoxicillin Rash    Has patient had a PCN reaction causing immediate rash, facial/tongue/throat swelling, SOB or lightheadedness with hypotension: Yes Has patient had a PCN reaction causing severe rash involving mucus membranes or skin necrosis: No Has patient had a PCN reaction that required hospitalization: No Has patient had a PCN reaction occurring within the last 10 years: No If all of the above answers are "NO", then may proceed with Cephalosporin use.    Pseudoephedrine Hcl Er  Palpitations    HOME MEDICATIONS: Outpatient Medications Prior to Visit  Medication Sig Dispense Refill   ACCU-CHEK FASTCLIX LANCETS MISC USE TO CHECK BLOOD SUGAR 3 TIMES A DAY 102 each 0   ACCU-CHEK GUIDE test strip USE TO CHECK BLOOD SUGAR 3 TIMES PER DAY. (Patient taking differently: Use to check blood sugar 4 times per day.) 100 each 2   albuterol (VENTOLIN HFA) 108 (90 Base) MCG/ACT inhaler Inhale 2 puffs into the lungs as needed for wheezing or shortness of breath. 18 g 2   amLODipine (NORVASC) 5 MG tablet Take 1 tablet by mouth once a day 90 tablet 1   aspirin EC 81 MG tablet Take 81 mg by mouth every evening.     atorvastatin (LIPITOR) 10 MG tablet TAKE 1 TABLET BY MOUTH AT BEDTIME 90 tablet 3   azelastine (ASTELIN)  0.1 % nasal spray Place 2 sprays into both nostrils 2 (two) times daily as needed. 30 mL 5   brexpiprazole (REXULTI) 2 MG TABS tablet Take 1 tablet (2 mg total) by mouth every morning. 90 tablet 1   buPROPion (WELLBUTRIN XL) 150 MG 24 hr tablet Take 3 tablets (450 mg total) by mouth in the morning. 270 tablet 1   Calcium Carbonate-Vitamin D 600-400 MG-UNIT tablet Take 1 tablet by mouth daily.     carbidopa-levodopa (SINEMET CR) 50-200 MG tablet Take 1 tablet by mouth 5 (five) times daily. 450 tablet 3   carbidopa-levodopa (SINEMET) 25-250 MG tablet Take 1 tablet by mouth every morning AND 1 tablet daily in the afternoon at 2 pm. 180 tablet 1   Continuous Blood Gluc Receiver (DEXCOM G7 RECEIVER) DEVI Use as directed to check blood sugar 1 each 1   Continuous Blood Gluc Sensor (DEXCOM G7 SENSOR) MISC Use to check blood sugar and change sensor every 10 days 9 each 4   COVID-19 mRNA vaccine, Pfizer, (COMIRNATY) syringe Inject into the muscle. 0.3 mL 0   dapagliflozin propanediol (FARXIGA) 5 MG TABS tablet Take 1 tablet (5 mg total) by mouth daily. 90 tablet 4   EPINEPHrine (EPIPEN 2-PAK) 0.3 mg/0.3 mL IJ SOAJ injection Inject 0.3 mg into the muscle as needed for anaphylaxis. 2  each 1   gabapentin (NEURONTIN) 300 MG capsule Take 1 capsule (300 mg total) by mouth 3 (three) times daily. 270 capsule 0   glucose blood test strip Use to check blood sugar 4 times daily 400 each 3   insulin lispro (HUMALOG) 100 UNIT/ML injection SQ - 14 units at breakfast, 12 units at lunch, 16 units at supper 10 mL 0   Insulin Pen Needle (TECHLITE PEN NEEDLES) 32G X 6 MM MISC Use to inject insulin 4 (four) times daily. 400 each 0   lidocaine (LIDODERM) 5 % Place 2 patches onto the skin daily and remove after 12 hours. Apply for 15 days . 30 patch 0   LORazepam (ATIVAN) 2 MG tablet Take 1 tablet (2 mg total) by mouth 3 (three) times daily. 270 tablet 1   losartan (COZAAR) 100 MG tablet Take 1 tablet (100 mg total) by mouth daily. 90 tablet 3   Olopatadine HCl (PATADAY) 0.2 % SOLN Place 1 drop into both eyes daily as needed. 2.5 mL 5   omalizumab (XOLAIR) 150 MG/ML prefilled syringe Inject 300 mg into the skin every 28 (twenty-eight) days. 2 mL 11   oxybutynin (DITROPAN-XL) 10 MG 24 hr tablet Take 1 tablet (10 mg total) by mouth daily. 30 tablet 11   propranolol (INDERAL) 40 MG tablet Take 1 tablet (40 mg total) by mouth 2 (two) times daily. 60 tablet 11   rOPINIRole (REQUIP) 2 MG tablet Take 1 tablet (2 mg total) by mouth 2 (two) times daily. 360 tablet 1   solifenacin (VESICARE) 10 MG tablet Take 1 tablet (10 mg total) by mouth daily. 30 tablet 11   tirzepatide (MOUNJARO) 15 MG/0.5ML Pen Inject 15 mg into the skin once a week. 6 mL 0   triamcinolone (NASACORT) 55 MCG/ACT AERO nasal inhaler Place 2 sprays into the nose daily. 16.9 mL 5   valACYclovir (VALTREX) 500 MG tablet Take 1 tablet (500 mg total) by mouth daily. 90 tablet 4   vitamin C (ASCORBIC ACID) 500 MG tablet Take 500 mg by mouth daily.     Vitamin D, Ergocalciferol, 2000 units CAPS Take 1 capsule by mouth daily.  2000 units daily.     vortioxetine HBr (TRINTELLIX) 20 MG TABS tablet Take 1 tablet (20 mg total) by mouth every morning.  90 tablet 1   zolpidem (AMBIEN) 5 MG tablet Take 1 tablet (5 mg total) by mouth at bedtime. 90 tablet 1   Facility-Administered Medications Prior to Visit  Medication Dose Route Frequency Provider Last Rate Last Admin   tranexamic acid (CYKLOKAPRON) topical -INTRAOP  2,000 mg Topical Once [place], [facility], PA-C        PAST MEDICAL HISTORY: Past Medical History:  Diagnosis Date   Anemia    Anxiety    Arthritis    L HIP   Asthma    Avascular necrosis of hip (HCC)    LEFT   Back pain    Chest pain    Constipation    Depression    Diabetes mellitus    Diabetes mellitus, type II (HCC)    Dry cough    Edema, lower extremity    Gait abnormality 11/18/2019   GERD (gastroesophageal reflux disease)    High cholesterol    Hypertension    Insomnia    takes Ambien nightly   Joint pain    Kidney disease    Kidney disease    Obesity    Parkinsonism (HCC) 03/02/2017   PONV (postoperative nausea and vomiting)    RLS (restless legs syndrome) 01/15/2018   Sleep apnea    Swallowing difficulty    Thyroid disease    TIA (transient ischemic attack) 2012   no residual problems   Urgency incontinence    Vitamin D deficiency     PAST SURGICAL HISTORY: Past Surgical History:  Procedure Laterality Date   DILATION AND CURETTAGE OF UTERUS     ESOPHAGEAL MANOMETRY N/A 09/03/2012   Procedure: ESOPHAGEAL MANOMETRY (EM);  Surgeon: Charolett Bumpers, MD;  Location: [facility];  Service: Endoscopy;  Laterality: N/A;   ESOPHAGEAL MANOMETRY N/A 06/19/2017   Procedure: ESOPHAGEAL MANOMETRY (EM);  Surgeon: Vida Rigger, MD;  Location: [facility];  Service: Endoscopy;  Laterality: N/A;   EXPLORATORY LAPAROTOMY     GANGLION CYST EXCISION     JOINT REPLACEMENT  2011   rt total hip   KNEE ARTHROSCOPY     PILONIDAL CYST / SINUS EXCISION     TOTAL HIP ARTHROPLASTY Left 10/09/2013   Procedure: LEFT TOTAL HIP ARTHROPLASTY ANTERIOR APPROACH;  Surgeon: Loanne Drilling, MD;  Location: [facility];  Service:  Orthopedics;  Laterality: Left;    FAMILY HISTORY: Family History  Problem Relation Age of Onset   High blood pressure Mother    Hyperlipidemia Mother    Heart disease Mother    Heart disease Father    Alcoholism Father    Allergic rhinitis Sister    Alcohol abuse Brother    Alcohol abuse Brother    Alcohol abuse Brother    Alcohol abuse Brother    Alzheimer's disease Maternal Aunt    Healthy Child    Diabetes Neg Hx    Angioedema Neg Hx    Asthma Neg Hx    Eczema Neg Hx    Immunodeficiency Neg Hx    Urticaria Neg Hx    Breast cancer Neg Hx    Parkinson's disease Neg Hx    Sleep apnea Neg Hx     SOCIAL HISTORY: Social History   Socioeconomic History   Marital status: Married    Spouse name: Maurine Minister   Number of children: Not on file   Years of  education: Not on file   Highest education level: Not on file  Occupational History   Occupation: retired Char[employer] fundraiser  Tobacco Use   Smoking status: Never   Smokeless tobacco: Never  Vaping Use   Vaping status: Never Used  Substance and Sexual Activity   Alcohol use: No    Alcohol/week: 0.0 standard drinks of alcohol   Drug use: No   Sexual activity: Not Currently  Other Topics Concern   Not on file  Social History Narrative   Lives with husband   Caffeine use: none   Right handed    Social Determinants of Health   Financial Resource Strain: Not on file  Food Insecurity: Low Risk  (02/13/2023)   Received from [facility]   Hunger Vital Sign    Worried About Running Out of Food in the Last Year: Never true    Ran Out of Food in the Last Year: Never true  Transportation Needs: No Transportation Needs (02/13/2023)   Received from [facility]    In the past 12 months, has lack of reliable transportation kept you from medical appointments, meetings, work or from getting things needed for daily living? : No  Physical Activity: Not on file  Stress: Not on file  Social Connections: Not on file  Intimate  Partner Violence: Not on file      PHYSICAL EXAM  Vitals:   03/13/23 0904  BP: 106/68  Pulse: 66  Weight: 195 lb (88.5 kg)  Height: 5\' 6"  (1.676 m)     Body mass index is 31.47 kg/m.  Generalized: Well developed, in no acute distress   Neurological examination  Mentation: Alert oriented to time, place, history taking. Follows all commands speech and language fluent Cranial nerve II-XII: Pupils were equal round reactive to light. Extraocular movements were full, visual field were full on confrontational test. Facial sensation and strength were normal. Head turning and shoulder shrug  were normal and symmetric. Motor: The motor testing reveals 5 over 5 strength of all 4 extremities. Good symmetric motor tone is noted throughout.  Finger taps moderately impaired toe taps moderately to severely impaired.  Tremor noted in the jaw Sensory: Sensory testing is intact to soft touch on all 4 extremities. No evidence of extinction is noted.  Coordination: Cerebellar testing reveals good finger-nose-finger and heel-to-shin bilaterally.     DIAGNOSTIC DATA (LABS, IMAGING, TESTING) - I reviewed patient records, labs, notes, testing and imaging myself where available.  Lab Results  Component Value Date   WBC 6.9 08/27/2022   HGB 11.9 (L) 08/27/2022   HCT 34.9 (L) 08/27/2022   MCV 98.6 08/27/2022   PLT 193 08/27/2022      Component Value Date/Time   NA 128 (L) 08/27/2022 2108   NA 138 04/05/2022 0942   NA 142 01/12/2017 0914   K 3.9 08/27/2022 2108   K 4.2 01/12/2017 0914   CL 96 (L) 08/27/2022 2108   CO2 24 08/27/2022 2108   CO2 28 01/12/2017 0914   GLUCOSE 84 08/27/2022 2108   GLUCOSE 142 (H) 01/12/2017 0914   BUN 16 08/27/2022 2108   BUN 19 04/05/2022 0942   BUN 12.0 01/12/2017 0914   CREATININE 1.26 (H) 08/27/2022 2108   CREATININE 1.58 (H) 10/12/2018 1121   CREATININE 1.3 (H) 01/12/2017 0914   CALCIUM 8.7 (L) 08/27/2022 2108   CALCIUM 9.6 01/12/2017 0914   PROT 7.2  08/27/2022 2108   PROT 7.1 04/05/2022 0942   PROT 7.2 01/12/2017 0914  ALBUMIN 3.5 08/27/2022 2108   ALBUMIN 4.3 04/05/2022 0942   ALBUMIN 3.5 01/12/2017 0914   AST 18 08/27/2022 2108   AST 16 10/12/2018 1121   AST 19 01/12/2017 0914   ALT 5 08/27/2022 2108   ALT <6 10/12/2018 1121   ALT 18 01/12/2017 0914   ALKPHOS 47 08/27/2022 2108   ALKPHOS 43 01/12/2017 0914   BILITOT 0.4 08/27/2022 2108   BILITOT 0.3 04/05/2022 0942   BILITOT 0.2 (L) 10/12/2018 1121   BILITOT 0.28 01/12/2017 0914   GFRNONAA 47 (L) 08/27/2022 2108   GFRNONAA 34 (L) 10/12/2018 1121   GFRAA 44 (L) 03/02/2020 0920   GFRAA 40 (L) 10/12/2018 1121   Lab Results  Component Value Date   CHOL 155 04/16/2019   HDL 84 04/16/2019   LDLCALC 57 04/16/2019   TRIG 71 04/16/2019   Lab Results  Component Value Date   HGBA1C 7.9 (H) 04/16/2019   Lab Results  Component Value Date   VITAMINB12 255 04/05/2022   Lab Results  Component Value Date   TSH 1.200 04/05/2022      ASSESSMENT AND PLAN 67 y.o. year old female  has a past medical history of Anemia, Anxiety, Arthritis, Asthma, Avascular necrosis of hip (HCC), Back pain, Chest pain, Constipation, Depression, Diabetes mellitus, Diabetes mellitus, type II (HCC), Dry cough, Edema, lower extremity, Gait abnormality (11/18/2019), GERD (gastroesophageal reflux disease), High cholesterol, Hypertension, Insomnia, Joint pain, Kidney disease, Kidney disease, Obesity, Parkinsonism (HCC) (03/02/2017), PONV (postoperative nausea and vomiting), RLS (restless legs syndrome) (01/15/2018), Sleep apnea, Swallowing difficulty, Thyroid disease, TIA (transient ischemic attack) (2012), Urgency incontinence, and Vitamin D deficiency. here with:   1.  Parkinsonism   Continue Sinemet CR 50-200 mg 5 times a day Continue Sinemet IR 25-250 mg twice a day Continue using cane at all times.   Follow-up in 6 months or sooner if needed   Advised if symptoms worsen or she develops new  symptoms she should let us know follow-up in 6 months or sooner if needed     Butch Penny, MSN, NP-C 03/13/2023, 8:48 AM [facility] [address] [place], [place] [address] [phone]

## 2023-03-13 ENCOUNTER — Ambulatory Visit (INDEPENDENT_AMBULATORY_CARE_PROVIDER_SITE_OTHER): Payer: Commercial Managed Care - PPO | Admitting: Adult Health

## 2023-03-13 ENCOUNTER — Encounter: Payer: Self-pay | Admitting: Adult Health

## 2023-03-13 ENCOUNTER — Other Ambulatory Visit (HOSPITAL_COMMUNITY): Payer: Self-pay | Admitting: Pharmacy Technician

## 2023-03-13 ENCOUNTER — Other Ambulatory Visit (HOSPITAL_BASED_OUTPATIENT_CLINIC_OR_DEPARTMENT_OTHER): Payer: Self-pay

## 2023-03-13 ENCOUNTER — Other Ambulatory Visit (HOSPITAL_COMMUNITY): Payer: Self-pay

## 2023-03-13 ENCOUNTER — Encounter (INDEPENDENT_AMBULATORY_CARE_PROVIDER_SITE_OTHER): Payer: Self-pay | Admitting: Family Medicine

## 2023-03-13 ENCOUNTER — Ambulatory Visit (INDEPENDENT_AMBULATORY_CARE_PROVIDER_SITE_OTHER): Payer: Commercial Managed Care - PPO | Admitting: Family Medicine

## 2023-03-13 VITALS — BP 106/68 | HR 66 | Ht 66.0 in | Wt 195.0 lb

## 2023-03-13 VITALS — BP 94/60 | HR 72 | Temp 97.6°F | Ht 66.0 in | Wt 191.0 lb

## 2023-03-13 DIAGNOSIS — Z6830 Body mass index (BMI) 30.0-30.9, adult: Secondary | ICD-10-CM

## 2023-03-13 DIAGNOSIS — G20A1 Parkinson's disease without dyskinesia, without mention of fluctuations: Secondary | ICD-10-CM | POA: Diagnosis not present

## 2023-03-13 DIAGNOSIS — E1169 Type 2 diabetes mellitus with other specified complication: Secondary | ICD-10-CM

## 2023-03-13 DIAGNOSIS — Z794 Long term (current) use of insulin: Secondary | ICD-10-CM

## 2023-03-13 DIAGNOSIS — E669 Obesity, unspecified: Secondary | ICD-10-CM

## 2023-03-13 DIAGNOSIS — G20C Parkinsonism, unspecified: Secondary | ICD-10-CM | POA: Diagnosis not present

## 2023-03-13 DIAGNOSIS — Z7985 Long-term (current) use of injectable non-insulin antidiabetic drugs: Secondary | ICD-10-CM

## 2023-03-13 DIAGNOSIS — Z7984 Long term (current) use of oral hypoglycemic drugs: Secondary | ICD-10-CM | POA: Diagnosis not present

## 2023-03-13 DIAGNOSIS — R040 Epistaxis: Secondary | ICD-10-CM

## 2023-03-13 MED ORDER — TIRZEPATIDE 15 MG/0.5ML ~~LOC~~ SOAJ
15.0000 mg | SUBCUTANEOUS | 0 refills | Status: DC
  Filled 2023-03-13: qty 6, 84d supply, fill #0

## 2023-03-13 NOTE — Patient Instructions (Signed)
Continue Sinemet CR 50-200 mg 5 times a day Continue Sinemet IR 25-250 mg twice a day Continue using cane at all times.

## 2023-03-13 NOTE — Progress Notes (Signed)
Specialty Pharmacy Refill Coordination Note  Deanna Rowe is a 67 y.o. female contacted today regarding refills of specialty medication(s) Omalizumab   Patient requested [name] at [facility] [place] date: 03/22/23   Medication will be filled on 03/21/23.

## 2023-03-13 NOTE — Progress Notes (Signed)
.smr  Office: [phone]  /  Fax: [phone]  WEIGHT SUMMARY AND BIOMETRICS  Anthropometric Measurements Height: 5\' 6"  (1.676 m) Weight: 191 lb (86.6 kg) BMI (Calculated): 30.84 Weight at Last Visit: 3 lb Weight Lost Since Last Visit: 18 lb Starting Weight: 237 lb Total Weight Loss (lbs): 46 lb (20.9 kg) Peak Weight: 237 lb   Body Composition  Body Fat %: 42 % Fat Mass (lbs): 80.4 lbs Muscle Mass (lbs): 105.6 lbs Total Body Water (lbs): 73.2 lbs Visceral Fat Rating : 12   Other Clinical Data Fasting: no Labs: no Today's Visit #: 50 Starting Date: 04/16/19    Chief Complaint: OBESITY   Discussed the use of AI scribe software for clinical note transcription with the patient, who gave verbal consent to proceed.  History of Present Illness   The patient, with a history of obesity, type 2 diabetes, and Parkinson's disease, has been making significant progress in her health management. She has lost three pounds in the last seven weeks and has been adhering to her category three eating plan 90% of the time. She has also been engaging in cardio and strengthening exercises for about sixty minutes three times per week.  The patient has successfully discontinued insulin under the guidance of her endocrinologist, following an HbA1c result of 6.1. She continues to take [place] for her diabetes management.  Despite her progress, the patient has been experiencing challenges with her protein intake, which she attributes to the effects of Mounjaro. She denies skipping any meals.  The patient has been participating in a Parkinson's group, which involves exercise sessions on Tuesdays and Wednesdays. She reports only one fall in recent times and believes that her exercise regimen is helping to keep her stronger and less likely to fall.  The patient's sleep has been good, and she denies any lightheadedness. She reports adequate hydration, consuming five bottles of water  daily. However, she recently experienced a severe nosebleed, which she has never had before. The nosebleed was managed at home with cotton and pressure.  The patient has received her flu shot but has yet to receive her COVID-19 vaccine. She reports being up to date on all her pneumonia shots. She has an upcoming appointment for an RSV vaccine.  The patient's medication refills were discussed, and she did not report any new concerns or questions. She has been scheduled for a follow-up appointment in January.          PHYSICAL EXAM:  Blood pressure 94/60, pulse 72, temperature 97.6 F (36.4 C), height 5\' 6"  (1.676 m), weight 191 lb (86.6 kg), SpO2 99%. Body mass index is 30.83 kg/m.  DIAGNOSTIC DATA REVIEWED:  BMET    Component Value Date/Time   NA 128 (L) 08/27/2022 2108   NA 138 04/05/2022 0942   NA 142 01/12/2017 0914   K 3.9 08/27/2022 2108   K 4.2 01/12/2017 0914   CL 96 (L) 08/27/2022 2108   CO2 24 08/27/2022 2108   CO2 28 01/12/2017 0914   GLUCOSE 84 08/27/2022 2108   GLUCOSE 142 (H) 01/12/2017 0914   BUN 16 08/27/2022 2108   BUN 19 04/05/2022 0942   BUN 12.0 01/12/2017 0914   CREATININE 1.26 (H) 08/27/2022 2108   CREATININE 1.58 (H) 10/12/2018 1121   CREATININE 1.3 (H) 01/12/2017 0914   CALCIUM 8.7 (L) 08/27/2022 2108   CALCIUM 9.6 01/12/2017 0914   GFRNONAA 47 (L) 08/27/2022 2108   GFRNONAA 34 (L) 10/12/2018 1121   GFRAA 44 (L)  03/02/2020 0920   GFRAA 40 (L) 10/12/2018 1121   Lab Results  Component Value Date   HGBA1C 7.9 (H) 04/16/2019   HGBA1C 7.3 09/04/2014   Lab Results  Component Value Date   INSULIN 12.3 04/16/2019   Lab Results  Component Value Date   TSH 1.200 04/05/2022   CBC    Component Value Date/Time   WBC 6.9 08/27/2022 2108   RBC 3.54 (L) 08/27/2022 2108   HGB 11.9 (L) 08/27/2022 2108   HGB 12.6 04/05/2022 0942   HGB 11.4 (L) 01/12/2017 0913   HCT 34.9 (L) 08/27/2022 2108   HCT 36.0 04/05/2022 0942   HCT 35.2 01/12/2017 0913    PLT 193 08/27/2022 2108   PLT 255 04/05/2022 0942   MCV 98.6 08/27/2022 2108   MCV 98 (H) 04/05/2022 0942   MCV 100.9 01/12/2017 0913   MCH 33.6 08/27/2022 2108   MCHC 34.1 08/27/2022 2108   RDW 12.7 08/27/2022 2108   RDW 12.4 04/05/2022 0942   RDW 14.1 01/12/2017 0913   Iron Studies No results found for: "IRON", "TIBC", "FERRITIN", "IRONPCTSAT" Lipid Panel     Component Value Date/Time   CHOL 155 04/16/2019 1451   TRIG 71 04/16/2019 1451   HDL 84 04/16/2019 1451   LDLCALC 57 04/16/2019 1451   Hepatic Function Panel     Component Value Date/Time   PROT 7.2 08/27/2022 2108   PROT 7.1 04/05/2022 0942   PROT 7.2 01/12/2017 0914   ALBUMIN 3.5 08/27/2022 2108   ALBUMIN 4.3 04/05/2022 0942   ALBUMIN 3.5 01/12/2017 0914   AST 18 08/27/2022 2108   AST 16 10/12/2018 1121   AST 19 01/12/2017 0914   ALT 5 08/27/2022 2108   ALT <6 10/12/2018 1121   ALT 18 01/12/2017 0914   ALKPHOS 47 08/27/2022 2108   ALKPHOS 43 01/12/2017 0914   BILITOT 0.4 08/27/2022 2108   BILITOT 0.3 04/05/2022 0942   BILITOT 0.2 (L) 10/12/2018 1121   BILITOT 0.28 01/12/2017 0914      Component Value Date/Time   TSH 1.200 04/05/2022 0942   Nutritional Lab Results  Component Value Date   VD25OH 44.8 12/17/2019   VD25OH 40.9 04/16/2019     Assessment and Plan    Type 2 Diabetes Mellitus On Mounjaro and Farxiga. Successfully discontinued insulin with HbA1c of 6.1%. Discussed dehydration risk with Marcelline Deist and potential discontinuation if hypotension persists. Mounjaro less likely to cause dehydration. - Refill Mounjaro - Monitor blood pressure and hydration status - Consider reducing or discontinuing Farxiga if hypotension persists  Obesity Following category three eating plan 90% of the time and exercising 60 minutes three times per week. Lost three pounds in seven weeks. Good adherence despite holidays. - Continue current eating plan - Continue current exercise regimen  Parkinson's  Disease Participates in Parkinson's exercise group twice a week and uses a machine for additional exercise. Only one recent fall, indicating good symptom management. Exercise helps maintain strength and prevent falls. - Continue current exercise regimen especially concentrating on core strengthening and protein intake to maintain muscle mass  Epistaxis Severe epistaxis likely due to dry indoor air. Managed with cotton and pressure. Advised use of nasal saline and humidifiers to prevent recurrence. - Use nasal saline - Use humidifiers in frequently used rooms  General Health Maintenance Received flu shot. Up to date on pneumonia vaccinations. Upcoming appointment for RSV vaccine. Needs COVID-19 booster. - Get COVID-19 booster - Get RSV vaccine at upcoming appointment  Follow-up - Confirm next  appointment with front desk - Send MyChart message if any issues arise before next visit.        She was informed of the importance of frequent follow up visits to maximize her success with intensive lifestyle modifications for her multiple health conditions.    Quillian Quince, MD

## 2023-03-14 ENCOUNTER — Telehealth: Payer: Self-pay | Admitting: Neurology

## 2023-03-14 ENCOUNTER — Other Ambulatory Visit (HOSPITAL_BASED_OUTPATIENT_CLINIC_OR_DEPARTMENT_OTHER): Payer: Self-pay

## 2023-03-14 ENCOUNTER — Other Ambulatory Visit: Payer: Self-pay

## 2023-03-14 ENCOUNTER — Ambulatory Visit: Payer: Commercial Managed Care - PPO | Admitting: Adult Health

## 2023-03-14 MED ORDER — AREXVY 120 MCG/0.5ML IM SUSR
0.5000 mL | Freq: Once | INTRAMUSCULAR | 0 refills | Status: AC
  Filled 2023-03-14: qty 0.5, 1d supply, fill #0

## 2023-03-14 NOTE — Telephone Encounter (Signed)
Please call pt.  She has been patient of [facility] for many years.  Came to me in July for opinion.  Just saw [facility] yesterday and is on my schedule tomorrow.  She doesn't need that appt and can continue to f/u with [facility], and she has another appt there per [facility] records from yesterday.

## 2023-03-15 ENCOUNTER — Ambulatory Visit: Payer: Commercial Managed Care - PPO | Admitting: Neurology

## 2023-03-20 ENCOUNTER — Other Ambulatory Visit (HOSPITAL_COMMUNITY): Payer: Self-pay

## 2023-03-20 ENCOUNTER — Other Ambulatory Visit (HOSPITAL_BASED_OUTPATIENT_CLINIC_OR_DEPARTMENT_OTHER): Payer: Self-pay

## 2023-03-20 MED ORDER — TRINTELLIX 20 MG PO TABS
20.0000 mg | ORAL_TABLET | Freq: Every morning | ORAL | 0 refills | Status: DC
  Filled 2023-03-20 – 2023-03-21 (×2): qty 90, 90d supply, fill #0

## 2023-03-20 MED ORDER — BUPROPION HCL ER (XL) 150 MG PO TB24
450.0000 mg | ORAL_TABLET | Freq: Every morning | ORAL | 0 refills | Status: DC
  Filled 2023-03-20: qty 270, 90d supply, fill #0

## 2023-03-21 ENCOUNTER — Other Ambulatory Visit (HOSPITAL_COMMUNITY): Payer: Self-pay

## 2023-03-21 ENCOUNTER — Other Ambulatory Visit (HOSPITAL_BASED_OUTPATIENT_CLINIC_OR_DEPARTMENT_OTHER): Payer: Self-pay

## 2023-03-21 ENCOUNTER — Other Ambulatory Visit: Payer: Self-pay

## 2023-03-22 ENCOUNTER — Other Ambulatory Visit: Payer: Self-pay

## 2023-03-22 ENCOUNTER — Other Ambulatory Visit (HOSPITAL_BASED_OUTPATIENT_CLINIC_OR_DEPARTMENT_OTHER): Payer: Self-pay

## 2023-03-22 DIAGNOSIS — R350 Frequency of micturition: Secondary | ICD-10-CM | POA: Diagnosis not present

## 2023-03-22 DIAGNOSIS — R3914 Feeling of incomplete bladder emptying: Secondary | ICD-10-CM | POA: Diagnosis not present

## 2023-03-22 DIAGNOSIS — F251 Schizoaffective disorder, depressive type: Secondary | ICD-10-CM | POA: Diagnosis not present

## 2023-03-22 DIAGNOSIS — N3941 Urge incontinence: Secondary | ICD-10-CM | POA: Diagnosis not present

## 2023-03-22 MED ORDER — LORAZEPAM 2 MG PO TABS
2.0000 mg | ORAL_TABLET | Freq: Three times a day (TID) | ORAL | 1 refills | Status: DC
  Filled 2023-06-05: qty 270, 90d supply, fill #0
  Filled 2023-09-14: qty 270, 90d supply, fill #1

## 2023-03-22 MED ORDER — BUPROPION HCL ER (XL) 150 MG PO TB24
450.0000 mg | ORAL_TABLET | Freq: Every day | ORAL | 1 refills | Status: DC
  Filled 2023-03-22 – 2023-03-24 (×3): qty 270, 90d supply, fill #0
  Filled ????-??-??: fill #0

## 2023-03-22 MED ORDER — ROPINIROLE HCL 2 MG PO TABS
2.0000 mg | ORAL_TABLET | Freq: Two times a day (BID) | ORAL | 1 refills | Status: AC
  Filled 2023-03-22: qty 180, 90d supply, fill #0
  Filled 2023-06-05 – 2023-06-12 (×2): qty 180, 90d supply, fill #1
  Filled 2023-09-18: qty 180, 90d supply, fill #2
  Filled 2023-12-13: qty 180, 90d supply, fill #3

## 2023-03-22 MED ORDER — REXULTI 2 MG PO TABS
2.0000 mg | ORAL_TABLET | Freq: Every morning | ORAL | 1 refills | Status: DC
  Filled 2023-05-19: qty 90, 90d supply, fill #0
  Filled 2023-08-22: qty 90, 90d supply, fill #1

## 2023-03-22 MED ORDER — TRINTELLIX 20 MG PO TABS
20.0000 mg | ORAL_TABLET | Freq: Every morning | ORAL | 1 refills | Status: DC
  Filled 2023-03-22 – 2023-06-16 (×2): qty 90, 90d supply, fill #0
  Filled 2023-09-13: qty 90, 90d supply, fill #1

## 2023-03-22 MED ORDER — ZOLPIDEM TARTRATE 5 MG PO TABS
5.0000 mg | ORAL_TABLET | Freq: Every day | ORAL | 1 refills | Status: DC
  Filled 2023-03-22 – 2023-05-02 (×2): qty 90, 90d supply, fill #0
  Filled 2023-07-10 – 2023-07-31 (×2): qty 90, 90d supply, fill #1

## 2023-03-23 ENCOUNTER — Other Ambulatory Visit (HOSPITAL_BASED_OUTPATIENT_CLINIC_OR_DEPARTMENT_OTHER): Payer: Self-pay

## 2023-03-24 ENCOUNTER — Other Ambulatory Visit (HOSPITAL_BASED_OUTPATIENT_CLINIC_OR_DEPARTMENT_OTHER): Payer: Self-pay

## 2023-03-24 ENCOUNTER — Other Ambulatory Visit: Payer: Self-pay | Admitting: Neurology

## 2023-03-24 DIAGNOSIS — H524 Presbyopia: Secondary | ICD-10-CM | POA: Diagnosis not present

## 2023-03-27 ENCOUNTER — Other Ambulatory Visit (HOSPITAL_BASED_OUTPATIENT_CLINIC_OR_DEPARTMENT_OTHER): Payer: Self-pay

## 2023-03-27 MED ORDER — CARBIDOPA-LEVODOPA 25-250 MG PO TABS
ORAL_TABLET | ORAL | 1 refills | Status: DC
  Filled 2023-04-04: qty 180, 90d supply, fill #0
  Filled 2023-06-30 (×2): qty 180, 90d supply, fill #1

## 2023-03-28 ENCOUNTER — Other Ambulatory Visit (HOSPITAL_BASED_OUTPATIENT_CLINIC_OR_DEPARTMENT_OTHER): Payer: Self-pay

## 2023-04-04 ENCOUNTER — Other Ambulatory Visit (HOSPITAL_BASED_OUTPATIENT_CLINIC_OR_DEPARTMENT_OTHER): Payer: Self-pay

## 2023-04-04 DIAGNOSIS — Z794 Long term (current) use of insulin: Secondary | ICD-10-CM | POA: Diagnosis not present

## 2023-04-04 DIAGNOSIS — E1165 Type 2 diabetes mellitus with hyperglycemia: Secondary | ICD-10-CM | POA: Diagnosis not present

## 2023-04-07 DIAGNOSIS — N1832 Chronic kidney disease, stage 3b: Secondary | ICD-10-CM | POA: Diagnosis not present

## 2023-04-10 ENCOUNTER — Ambulatory Visit (INDEPENDENT_AMBULATORY_CARE_PROVIDER_SITE_OTHER): Payer: Commercial Managed Care - PPO | Admitting: Family Medicine

## 2023-04-10 ENCOUNTER — Encounter (INDEPENDENT_AMBULATORY_CARE_PROVIDER_SITE_OTHER): Payer: Self-pay | Admitting: Family Medicine

## 2023-04-10 ENCOUNTER — Other Ambulatory Visit (HOSPITAL_BASED_OUTPATIENT_CLINIC_OR_DEPARTMENT_OTHER): Payer: Self-pay

## 2023-04-10 VITALS — BP 81/46 | HR 75 | Temp 97.6°F | Ht 66.0 in | Wt 187.0 lb

## 2023-04-10 DIAGNOSIS — I959 Hypotension, unspecified: Secondary | ICD-10-CM | POA: Diagnosis not present

## 2023-04-10 DIAGNOSIS — E119 Type 2 diabetes mellitus without complications: Secondary | ICD-10-CM | POA: Diagnosis not present

## 2023-04-10 DIAGNOSIS — Z7985 Long-term (current) use of injectable non-insulin antidiabetic drugs: Secondary | ICD-10-CM | POA: Diagnosis not present

## 2023-04-10 DIAGNOSIS — Z794 Long term (current) use of insulin: Secondary | ICD-10-CM

## 2023-04-10 DIAGNOSIS — I10 Essential (primary) hypertension: Secondary | ICD-10-CM | POA: Diagnosis not present

## 2023-04-10 DIAGNOSIS — Z9189 Other specified personal risk factors, not elsewhere classified: Secondary | ICD-10-CM

## 2023-04-10 DIAGNOSIS — E785 Hyperlipidemia, unspecified: Secondary | ICD-10-CM

## 2023-04-10 DIAGNOSIS — Z7984 Long term (current) use of oral hypoglycemic drugs: Secondary | ICD-10-CM | POA: Diagnosis not present

## 2023-04-10 DIAGNOSIS — E669 Obesity, unspecified: Secondary | ICD-10-CM

## 2023-04-10 DIAGNOSIS — Z6830 Body mass index (BMI) 30.0-30.9, adult: Secondary | ICD-10-CM

## 2023-04-10 DIAGNOSIS — E7800 Pure hypercholesterolemia, unspecified: Secondary | ICD-10-CM

## 2023-04-10 DIAGNOSIS — E1169 Type 2 diabetes mellitus with other specified complication: Secondary | ICD-10-CM

## 2023-04-10 MED ORDER — TIRZEPATIDE 12.5 MG/0.5ML ~~LOC~~ SOAJ
12.5000 mg | SUBCUTANEOUS | 0 refills | Status: DC
  Filled 2023-04-10: qty 2, 28d supply, fill #0
  Filled 2023-05-02 – 2023-05-03 (×2): qty 2, 28d supply, fill #1

## 2023-04-10 NOTE — Progress Notes (Signed)
 .smr  Office: [phone]  /  Fax: [phone]  WEIGHT SUMMARY AND BIOMETRICS  Anthropometric Measurements Height: 5' 6 (1.676 m) Weight: 187 lb (84.8 kg) BMI (Calculated): 30.2 Weight at Last Visit: 191 lb Weight Lost Since Last Visit: 4 lb Weight Gained Since Last Visit: 0 Starting Weight: 237 lb Total Weight Loss (lbs): 51 lb (23.1 kg)   Body Composition  Body Fat %: 40.3 % Fat Mass (lbs): 75.6 lbs Muscle Mass (lbs): 106.2 lbs Total Body Water (lbs): 69.8 lbs Visceral Fat Rating : 11   Other Clinical Data Fasting: No Labs: No Today's Visit #: 50 Starting Date: 04/16/19    Chief Complaint: OBESITY   History of Present Illness   The patient, with a history of obesity, type 2 diabetes, hyperlipidemia, and hypertension, has been actively managing her health through lifestyle interventions and medication. She has been successful in her weight loss efforts, losing four pounds in the last month, even during the holiday season. She reports adhering to her category three eating plan about 75% of the time and engaging in cardio exercise for about 60 minutes three times per week.  The patient has been experiencing episodes of lightheadedness and has had four falls, which she attributes to a loss of balance. She reports feeling lightheaded prior to the falls. Her blood pressure was noted to be low at 81/46, which may be contributing to her lightheadedness and falls. She is currently on multiple medications, including losartan , amlodipine , and propranolol  for blood pressure, and Farxiga  and Mounjaro  for blood sugar control. She has discontinued insulin  and metformin .  The patient also reports frequent low blood sugar readings at night, with her continuous glucose monitor often alerting her to levels in the 50s. She expresses frustration with these low readings, as they occur despite having dinner and a snack. She has recently started incorporating two string cheeses into her bedtime  routine as a strategy to prevent these low readings.  The patient is also on lorazepam , which she takes three times a day and reports that it is helping a lot. She has a planned cruise vacation in the near future and is strategizing ways to maintain her weight loss efforts during the trip.          PHYSICAL EXAM:  Blood pressure (!) 81/46, pulse 75, temperature 97.6 F (36.4 C), height 5' 6 (1.676 m), weight 187 lb (84.8 kg), SpO2 96%. Body mass index is 30.18 kg/m.  DIAGNOSTIC DATA REVIEWED:  BMET    Component Value Date/Time   NA 128 (L) 08/27/2022 2108   NA 138 04/05/2022 0942   NA 142 01/12/2017 0914   K 3.9 08/27/2022 2108   K 4.2 01/12/2017 0914   CL 96 (L) 08/27/2022 2108   CO2 24 08/27/2022 2108   CO2 28 01/12/2017 0914   GLUCOSE 84 08/27/2022 2108   GLUCOSE 142 (H) 01/12/2017 0914   BUN 16 08/27/2022 2108   BUN 19 04/05/2022 0942   BUN 12.0 01/12/2017 0914   CREATININE 1.26 (H) 08/27/2022 2108   CREATININE 1.58 (H) 10/12/2018 1121   CREATININE 1.3 (H) 01/12/2017 0914   CALCIUM  8.7 (L) 08/27/2022 2108   CALCIUM  9.6 01/12/2017 0914   GFRNONAA 47 (L) 08/27/2022 2108   GFRNONAA 34 (L) 10/12/2018 1121   GFRAA 44 (L) 03/02/2020 0920   GFRAA 40 (L) 10/12/2018 1121   Lab Results  Component Value Date   HGBA1C 7.9 (H) 04/16/2019   HGBA1C 7.3 09/04/2014   Lab Results  Component Value  Date   INSULIN  12.3 04/16/2019   Lab Results  Component Value Date   TSH 1.200 04/05/2022   CBC    Component Value Date/Time   WBC 6.9 08/27/2022 2108   RBC 3.54 (L) 08/27/2022 2108   HGB 11.9 (L) 08/27/2022 2108   HGB 12.6 04/05/2022 0942   HGB 11.4 (L) 01/12/2017 0913   HCT 34.9 (L) 08/27/2022 2108   HCT 36.0 04/05/2022 0942   HCT 35.2 01/12/2017 0913   PLT 193 08/27/2022 2108   PLT 255 04/05/2022 0942   MCV 98.6 08/27/2022 2108   MCV 98 (H) 04/05/2022 0942   MCV 100.9 01/12/2017 0913   MCH 33.6 08/27/2022 2108   MCHC 34.1 08/27/2022 2108   RDW 12.7 08/27/2022  2108   RDW 12.4 04/05/2022 0942   RDW 14.1 01/12/2017 0913   Iron Studies No results found for: IRON, TIBC, FERRITIN, IRONPCTSAT Lipid Panel     Component Value Date/Time   CHOL 155 04/16/2019 1451   TRIG 71 04/16/2019 1451   HDL 84 04/16/2019 1451   LDLCALC 57 04/16/2019 1451   Hepatic Function Panel     Component Value Date/Time   PROT 7.2 08/27/2022 2108   PROT 7.1 04/05/2022 0942   PROT 7.2 01/12/2017 0914   ALBUMIN 3.5 08/27/2022 2108   ALBUMIN 4.3 04/05/2022 0942   ALBUMIN 3.5 01/12/2017 0914   AST 18 08/27/2022 2108   AST 16 10/12/2018 1121   AST 19 01/12/2017 0914   ALT 5 08/27/2022 2108   ALT <6 10/12/2018 1121   ALT 18 01/12/2017 0914   ALKPHOS 47 08/27/2022 2108   ALKPHOS 43 01/12/2017 0914   BILITOT 0.4 08/27/2022 2108   BILITOT 0.3 04/05/2022 0942   BILITOT 0.2 (L) 10/12/2018 1121   BILITOT 0.28 01/12/2017 0914      Component Value Date/Time   TSH 1.200 04/05/2022 0942   Nutritional Lab Results  Component Value Date   VD25OH 44.8 12/17/2019   VD25OH 40.9 04/16/2019     Assessment and Plan    Hypotension Episodes of lightheadedness and a fall. Blood pressure today is 81/46. Current medications include losartan , amlodipine , and propranolol . Decision made to reduce medication burden due to low blood pressure. Discussed risks of continued hypotension and benefits of reducing medication. - Discontinue amlodipine  - Notify primary care doctor via My Chart about the medication change and low blood pressure - Monitor blood pressure and symptoms  Hypertension On losartan  and propranolol  for blood pressure management. Given the low blood pressure readings, amlodipine  has been discontinued. Discussed risks of continued hypotension and benefits of reducing medication. - Monitor blood pressure and symptoms - Notify primary care doctor about the medication change  Type 2 Diabetes Mellitus Off insulin , currently on Farxiga  and Mounjaro . Reports  frequent nocturnal hypoglycemia with blood sugars in the 50s. Decision made to adjust medication and dietary intake to manage low blood sugars. Discussed risks of hypoglycemia and benefits of medication adjustment. - Reduce Mounjaro  dose from 15 mg to 12.5 mg - Schedule a nighttime snack of two string cheeses - Notify endocrinologist (Dr. Faythe) about frequent low blood sugars to see if he feels other changes are warrented - Monitor blood sugar levels  Obesity Successful weight loss, losing four pounds in the last month despite the holiday season. Following category three eating plan 75% of the time and engaging in cardio exercise for 60 minutes three times per week. Total weight loss of 50 pounds since the first visit. Discussed strategies for maintaining  weight during upcoming cruise, including avoiding alcohol , choosing sit-down meals over buffets, and incorporating walking. - Continue current diet and exercise regimen - Provide anticipatory guidance for maintaining weight loss during upcoming cruise  Hyperlipidemia On cholesterol medication and continues to take it as prescribed. No issues reported with this medication. - Continue current cholesterol medication  General Health Maintenance Planning a cruise and has been given strategies to maintain weight and health during the trip. - Provide anticipatory guidance for maintaining health during the cruise, including avoiding alcohol , choosing sit-down meals, and incorporating walking  Follow-up - Schedule follow-up appointment in six weeks - Ensure primary care doctor and endocrinologist are informed of recent changes and symptoms - Provide 90-day prescription for Mounjaro .        She was informed of the importance of frequent follow up visits to maximize her success with intensive lifestyle modifications for her multiple health conditions.    Louann Penton, MD

## 2023-04-11 ENCOUNTER — Other Ambulatory Visit (HOSPITAL_BASED_OUTPATIENT_CLINIC_OR_DEPARTMENT_OTHER): Payer: Self-pay

## 2023-04-11 DIAGNOSIS — D631 Anemia in chronic kidney disease: Secondary | ICD-10-CM | POA: Diagnosis not present

## 2023-04-11 DIAGNOSIS — N2581 Secondary hyperparathyroidism of renal origin: Secondary | ICD-10-CM | POA: Diagnosis not present

## 2023-04-11 DIAGNOSIS — N189 Chronic kidney disease, unspecified: Secondary | ICD-10-CM | POA: Diagnosis not present

## 2023-04-11 DIAGNOSIS — N1832 Chronic kidney disease, stage 3b: Secondary | ICD-10-CM | POA: Diagnosis not present

## 2023-04-11 DIAGNOSIS — I129 Hypertensive chronic kidney disease with stage 1 through stage 4 chronic kidney disease, or unspecified chronic kidney disease: Secondary | ICD-10-CM | POA: Diagnosis not present

## 2023-04-14 ENCOUNTER — Other Ambulatory Visit (HOSPITAL_COMMUNITY): Payer: Self-pay | Admitting: Pharmacy Technician

## 2023-04-14 ENCOUNTER — Other Ambulatory Visit (HOSPITAL_COMMUNITY): Payer: Self-pay

## 2023-04-14 NOTE — Progress Notes (Signed)
 Specialty Pharmacy Refill Coordination Note  Deanna Rowe is a 68 y.o. female contacted today regarding refills of specialty medication(s) Omalizumab  (XOLAIR )   Patient requested Pickup at [facility] at [place] date: 05/01/23   Medication will be filled on 04/28/23.

## 2023-04-19 ENCOUNTER — Other Ambulatory Visit (HOSPITAL_BASED_OUTPATIENT_CLINIC_OR_DEPARTMENT_OTHER): Payer: Self-pay

## 2023-04-19 MED ORDER — COMIRNATY 30 MCG/0.3ML IM SUSY
0.3000 mL | PREFILLED_SYRINGE | Freq: Once | INTRAMUSCULAR | 0 refills | Status: AC
  Filled 2023-04-19: qty 0.3, 1d supply, fill #0

## 2023-04-28 ENCOUNTER — Other Ambulatory Visit: Payer: Self-pay

## 2023-05-02 ENCOUNTER — Other Ambulatory Visit: Payer: Self-pay

## 2023-05-02 ENCOUNTER — Other Ambulatory Visit (HOSPITAL_BASED_OUTPATIENT_CLINIC_OR_DEPARTMENT_OTHER): Payer: Self-pay

## 2023-05-02 ENCOUNTER — Other Ambulatory Visit (HOSPITAL_COMMUNITY): Payer: Self-pay

## 2023-05-03 ENCOUNTER — Other Ambulatory Visit (HOSPITAL_BASED_OUTPATIENT_CLINIC_OR_DEPARTMENT_OTHER): Payer: Self-pay

## 2023-05-04 ENCOUNTER — Ambulatory Visit: Payer: Commercial Managed Care - PPO | Admitting: Speech Pathology

## 2023-05-04 ENCOUNTER — Ambulatory Visit: Payer: Commercial Managed Care - PPO | Admitting: Occupational Therapy

## 2023-05-04 ENCOUNTER — Ambulatory Visit: Payer: Commercial Managed Care - PPO | Admitting: Physical Therapy

## 2023-05-09 ENCOUNTER — Other Ambulatory Visit (HOSPITAL_BASED_OUTPATIENT_CLINIC_OR_DEPARTMENT_OTHER): Payer: Self-pay

## 2023-05-10 ENCOUNTER — Other Ambulatory Visit (HOSPITAL_BASED_OUTPATIENT_CLINIC_OR_DEPARTMENT_OTHER): Payer: Self-pay

## 2023-05-10 ENCOUNTER — Other Ambulatory Visit: Payer: Self-pay

## 2023-05-10 MED ORDER — ATORVASTATIN CALCIUM 10 MG PO TABS
10.0000 mg | ORAL_TABLET | Freq: Every evening | ORAL | 3 refills | Status: DC
  Filled 2023-05-10: qty 90, 90d supply, fill #0

## 2023-05-13 ENCOUNTER — Other Ambulatory Visit (HOSPITAL_BASED_OUTPATIENT_CLINIC_OR_DEPARTMENT_OTHER): Payer: Self-pay

## 2023-05-14 NOTE — Progress Notes (Signed)
 Cardiology Office Note:  .   Date:  05/15/2023  ID:  Deanna Rowe, DOB 09/10/1955, MRN [id number] PCP: Annamarie Kid, MD  [facility] Providers Cardiologist:  Avery Bodo, MD {  History of Present Illness: .   Deanna Rowe is a 68 y.o. female with a past medical history of palpitations, Parkinson's, occasional PVCs and PACs here for follow-up appointment.  Prior cardiac testing in 2017 showed that her ETT was negative for ischemia.  No arrhythmias noted during the stress test.  Heart monitor was also fairly normal showing mostly normal sinus rhythm with occasional PVCs and PACs.  Echo in 2020 showed normal LV function.  In 2021 the patient reported DOE over the past several months and there was some chest tightness that was worse with walking.  Walking short distance is caused her symptoms.  Some issues with balance related to Parkinson's.  Has had 3 falls.  ER visit showed no fracture.  She does participate in a Parkinson's exercise group.  No chest pain or shortness of breath with activity.  In 2021 she also had a CT scan showing no CAD and a calcium  score of 0.  At her last visit 4/24 she denied chest pain, dizziness, nitroglycerin  use, orthopnea, palpitations, PND, shortness of breath, and syncope.  Occasionally edema that was worse by the end of the day.  Today, she presents with a history of low blood pressure, PVCs, and PACs, with lightheadedness and a headache that started on Saturday. The patient reports that her blood pressure has been generally low recently, and she was taken off amlodipine  two weeks ago. She is currently on losartan  and propranolol . The patient has not been feeling well since the weekend, and reports feeling lightheaded and having a headache. The patient's blood sugars have been stable, and she has been managing occasional low blood sugars at night with snacks. The patient also reports having bladder issues, for which she has an upcoming  appointment with another doctor.  Reports no shortness of breath nor dyspnea on exertion. Reports no chest pain, pressure, or tightness. No edema, orthopnea, PND. Reports no palpitations.   Discussed the use of AI scribe software for clinical note transcription with the patient, who gave verbal consent to proceed.   ROS: Pertinent ROS in HPI  Studies Reviewed: Aaron Aas   EKG Interpretation Date/Time:  Monday May 15 2023 09:25:29 EST Ventricular Rate:  72 PR Interval:  162 QRS Duration:  82 QT Interval:  398 QTC Calculation: 435 R Axis:   54  Text Interpretation: Normal sinus rhythm Normal ECG Confirmed by Lovette Rud [phone]) on 05/15/2023 10:28:24 AM    No recent studies   Physical Exam:   VS:  BP 108/68   Pulse 75   Ht 5\' 6"  (1.676 m)   Wt 191 lb 12.8 oz (87 kg)   SpO2 97%   BMI 30.96 kg/m    Wt Readings from Last 3 Encounters:  05/15/23 191 lb 12.8 oz (87 kg)  04/10/23 187 lb (84.8 kg)  03/13/23 191 lb (86.6 kg)    GEN: Well nourished, well developed in no acute distress NECK: No JVD; No carotid bruits CARDIAC: RRR, no murmurs, rubs, gallops RESPIRATORY:  Clear to auscultation without rales, wheezing or rhonchi  ABDOMEN: Soft, non-tender, non-distended EXTREMITIES:  No edema; No deformity   ASSESSMENT AND PLAN: .   Hypotension Recent lightheadedness and headache, possibly due to low blood pressure. Patient recently stopped Amlodipine  and is on Losartan  100mg  daily. -  Reduce Losartan  to 50mg  daily. -Monitor blood pressure at home for the next two weeks.  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 Currently on Propranolol  for heart rate control. -Order 7-day heart monitor to assess frequency of PACs and PVCs. -ordered an echocardiogram to assess heart structure and function.  General Health Maintenance -Order CBC to rule out anemia or infection. -Consider flu test if symptoms persist, given current flu  season. -Follow-up with primary care provider and urologist as scheduled   Parkinson's -continue current medical management       Dispo: Please follow-up in 2 months with APP and with PCP  Signed, Von Grumbling, PA-C

## 2023-05-15 ENCOUNTER — Encounter: Payer: Self-pay | Admitting: Physician Assistant

## 2023-05-15 ENCOUNTER — Other Ambulatory Visit (HOSPITAL_BASED_OUTPATIENT_CLINIC_OR_DEPARTMENT_OTHER): Payer: Self-pay

## 2023-05-15 ENCOUNTER — Telehealth: Payer: Self-pay | Admitting: Home Health

## 2023-05-15 ENCOUNTER — Ambulatory Visit: Payer: Commercial Managed Care - PPO | Attending: Physician Assistant

## 2023-05-15 ENCOUNTER — Ambulatory Visit: Payer: Commercial Managed Care - PPO | Attending: Physician Assistant | Admitting: Physician Assistant

## 2023-05-15 VITALS — BP 108/68 | HR 75 | Ht 66.0 in | Wt 191.8 lb

## 2023-05-15 DIAGNOSIS — R0789 Other chest pain: Secondary | ICD-10-CM

## 2023-05-15 DIAGNOSIS — E782 Mixed hyperlipidemia: Secondary | ICD-10-CM

## 2023-05-15 DIAGNOSIS — I493 Ventricular premature depolarization: Secondary | ICD-10-CM

## 2023-05-15 DIAGNOSIS — I10 Essential (primary) hypertension: Secondary | ICD-10-CM | POA: Diagnosis not present

## 2023-05-15 DIAGNOSIS — R42 Dizziness and giddiness: Secondary | ICD-10-CM

## 2023-05-15 LAB — CBC
Hematocrit: 36.4 % (ref 34.0–46.6)
Hemoglobin: 12.3 g/dL (ref 11.1–15.9)
MCH: 33.3 pg — ABNORMAL HIGH (ref 26.6–33.0)
MCHC: 33.8 g/dL (ref 31.5–35.7)
MCV: 99 fL — ABNORMAL HIGH (ref 79–97)
Platelets: 242 10*3/uL (ref 150–450)
RBC: 3.69 x10E6/uL — ABNORMAL LOW (ref 3.77–5.28)
RDW: 12.8 % (ref 11.7–15.4)
WBC: 5.4 10*3/uL (ref 3.4–10.8)

## 2023-05-15 MED ORDER — LOSARTAN POTASSIUM 50 MG PO TABS
50.0000 mg | ORAL_TABLET | Freq: Every day | ORAL | 3 refills | Status: DC
  Filled 2023-05-15: qty 90, 90d supply, fill #0

## 2023-05-15 NOTE — Progress Notes (Unsigned)
ZIO serial # [id number] from office inventory applied to patient. DOD to read.

## 2023-05-15 NOTE — Telephone Encounter (Signed)
 Patient called after hour line, reporting severe itchiness from wearing the monitor. She denied any rash. Advised apply hydrocortisone cream for pruritus and re-attempt monitor. If severe itchiness, OK to stop monitor tonight, call to the office for alternative option tomorrow. Patient agreed.

## 2023-05-15 NOTE — Patient Instructions (Signed)
 Medication Instructions:   START TAKING : LOSARTAN  50 MG ONCE A DAY   *If you need a refill on your cardiac medications before your next appointment, please call your pharmacy*   Lab Work: NONE ORDERED  TODAY    If you have labs (blood work) drawn today and your tests are completely normal, you will receive your results only by: MyChart Message (if you have MyChart) OR A paper copy in the mail If you have any lab test that is abnormal or we need to change your treatment, we will call you to review the results.   Testing/Procedures:Your physician has requested that you have an echocardiogram. Echocardiography is a painless test that uses sound waves to create images of your heart. It provides your doctor with information about the size and shape of your heart and how well your heart's chambers and valves are working. This procedure takes approximately one hour. There are no restrictions for this procedure. Please do NOT wear cologne, perfume, aftershave, or lotions (deodorant is allowed). Please arrive 15 minutes prior to your appointment time.  Please note: We ask at that you not bring children with you during ultrasound (echo/ vascular) testing. Due to room size and safety concerns, children are not allowed in the ultrasound rooms during exams. Our front office staff cannot provide observation of children in our lobby area while testing is being conducted. An adult accompanying a patient to their appointment will only be allowed in the ultrasound room at the discretion of the ultrasound technician under special circumstances. We apologize for any inconvenience.  Your physician has recommended that you wear an event monitor. Event monitors are medical devices that record the heart's electrical activity. Doctors most often us  these monitors to diagnose arrhythmias. Arrhythmias are problems with the speed or rhythm of the heartbeat. The monitor is a small, portable device. You can wear one while  you do your normal daily activities. This is usually used to diagnose what is causing palpitations/syncope (passing out).    Follow-Up: At [facility], you and your health needs are our priority.  As part of our continuing mission to provide you with exceptional heart care, we have created designated Provider Care Teams.  These Care Teams include your primary Cardiologist (physician) and Advanced Practice Providers (APPs -  Physician Assistants and Nurse Practitioners) who all work together to provide you with the care you need, when you need it.  We recommend signing up for the patient portal called "MyChart".  Sign up information is provided on this After Visit Summary.  MyChart is used to connect with patients for Virtual Visits (Telemedicine).  Patients are able to view lab/test results, encounter notes, upcoming appointments, etc.  Non-urgent messages can be sent to your provider as well.   To learn more about what you can do with MyChart, go to [email or web address].    Your next appointment:    2 month(s)   Provider:    Lovette Rud, PA-C, Dayna Dunn, PA-C, Charles Connor, NP, Theotis Flake, PA-C, Marlyse Single, PA-C, or Leala Prince, PA-C     Other Instructions       1st Floor: - Lobby - Registration  - Pharmacy  - Lab - Cafe  2nd Floor: - PV Lab - Diagnostic Testing (echo, CT, nuclear med)  3rd Floor: - Vacant  4th Floor: - TCTS (cardiothoracic surgery) - AFib Clinic - Structural Heart Clinic - Vascular Surgery  - Vascular Ultrasound  5th Floor: - [facility]Care Cardiology (general and EP) -  Clinical Pharmacy for coumadin, hypertension, lipid, weight-loss medications, and med management appointments    Valet parking services will be available as well.

## 2023-05-16 ENCOUNTER — Other Ambulatory Visit: Payer: Self-pay

## 2023-05-16 ENCOUNTER — Telehealth: Payer: Self-pay | Admitting: Physician Assistant

## 2023-05-16 ENCOUNTER — Other Ambulatory Visit: Payer: Self-pay | Admitting: *Deleted

## 2023-05-16 ENCOUNTER — Other Ambulatory Visit (HOSPITAL_BASED_OUTPATIENT_CLINIC_OR_DEPARTMENT_OTHER): Payer: Self-pay

## 2023-05-16 ENCOUNTER — Ambulatory Visit: Payer: Commercial Managed Care - PPO | Attending: Physician Assistant

## 2023-05-16 DIAGNOSIS — I493 Ventricular premature depolarization: Secondary | ICD-10-CM

## 2023-05-16 MED ORDER — GABAPENTIN 300 MG PO CAPS
300.0000 mg | ORAL_CAPSULE | Freq: Three times a day (TID) | ORAL | 0 refills | Status: DC
Start: 2023-05-16 — End: 2023-08-08
  Filled 2023-05-16: qty 270, 90d supply, fill #0

## 2023-05-16 NOTE — Telephone Encounter (Signed)
Patient noted she had her device placed yesterday and last night she started itching very badly. Patient stated she called the on-call and they advised her to apply hydrocortisone cream. Patient stated she did that but this morning the itching began again and was so bad she removed the device.  Patient wants advice on next steps.

## 2023-05-16 NOTE — Telephone Encounter (Signed)
Spoke with patient and she will send ZIO back with prepaid postage. She is aware a new monitor will be delivered to her home and needs to be send it back in prepaid box. Skin irritation instructions given. She verbalized understanding

## 2023-05-16 NOTE — Telephone Encounter (Signed)
Spoke patient and she states she had monitor placed yesterday in office. Last night it started itching really bad. She called on call and they advised to take it off if it was unbearable.  She tried cortisone cream. It only lasted for a short period of time before the itching started again. She removed the monitor.  Shelly do we have adhesive that is for sensitive skin.

## 2023-05-16 NOTE — Progress Notes (Unsigned)
Patient enrolled for [employer]/ [employer] to ship a 7 day long term monitor to her address on file. Hydocolloid sensitive skin strips requested.  DOD to read.

## 2023-05-17 ENCOUNTER — Encounter: Payer: Self-pay | Admitting: Physician Assistant

## 2023-05-17 DIAGNOSIS — N3941 Urge incontinence: Secondary | ICD-10-CM | POA: Diagnosis not present

## 2023-05-17 DIAGNOSIS — R350 Frequency of micturition: Secondary | ICD-10-CM | POA: Diagnosis not present

## 2023-05-18 ENCOUNTER — Other Ambulatory Visit: Payer: Self-pay

## 2023-05-18 NOTE — Progress Notes (Signed)
Specialty Pharmacy Refill Coordination Note  Deanna Rowe is a 68 y.o. female contacted today regarding refills of specialty medication(s) Omalizumab Deanna Rowe)   Patient requested Deanna Rowe at [facility] at [place] date: 05/29/23   Medication will be filled on 05/26/23.

## 2023-05-19 ENCOUNTER — Other Ambulatory Visit (HOSPITAL_BASED_OUTPATIENT_CLINIC_OR_DEPARTMENT_OTHER): Payer: Self-pay

## 2023-05-22 ENCOUNTER — Other Ambulatory Visit (HOSPITAL_BASED_OUTPATIENT_CLINIC_OR_DEPARTMENT_OTHER): Payer: Self-pay

## 2023-05-22 DIAGNOSIS — I493 Ventricular premature depolarization: Secondary | ICD-10-CM | POA: Diagnosis not present

## 2023-05-24 ENCOUNTER — Ambulatory Visit (INDEPENDENT_AMBULATORY_CARE_PROVIDER_SITE_OTHER): Payer: Commercial Managed Care - PPO | Admitting: Family Medicine

## 2023-05-24 ENCOUNTER — Encounter (INDEPENDENT_AMBULATORY_CARE_PROVIDER_SITE_OTHER): Payer: Self-pay | Admitting: Family Medicine

## 2023-05-24 ENCOUNTER — Other Ambulatory Visit (HOSPITAL_BASED_OUTPATIENT_CLINIC_OR_DEPARTMENT_OTHER): Payer: Self-pay

## 2023-05-24 VITALS — BP 117/64 | HR 67 | Temp 98.0°F | Ht 66.0 in | Wt 189.0 lb

## 2023-05-24 DIAGNOSIS — E669 Obesity, unspecified: Secondary | ICD-10-CM | POA: Diagnosis not present

## 2023-05-24 DIAGNOSIS — I152 Hypertension secondary to endocrine disorders: Secondary | ICD-10-CM

## 2023-05-24 DIAGNOSIS — G20A1 Parkinson's disease without dyskinesia, without mention of fluctuations: Secondary | ICD-10-CM | POA: Diagnosis not present

## 2023-05-24 DIAGNOSIS — K5900 Constipation, unspecified: Secondary | ICD-10-CM | POA: Diagnosis not present

## 2023-05-24 DIAGNOSIS — K5901 Slow transit constipation: Secondary | ICD-10-CM

## 2023-05-24 DIAGNOSIS — I10 Essential (primary) hypertension: Secondary | ICD-10-CM | POA: Diagnosis not present

## 2023-05-24 DIAGNOSIS — Z6830 Body mass index (BMI) 30.0-30.9, adult: Secondary | ICD-10-CM | POA: Diagnosis not present

## 2023-05-24 DIAGNOSIS — E119 Type 2 diabetes mellitus without complications: Secondary | ICD-10-CM | POA: Diagnosis not present

## 2023-05-24 DIAGNOSIS — Z7985 Long-term (current) use of injectable non-insulin antidiabetic drugs: Secondary | ICD-10-CM

## 2023-05-24 DIAGNOSIS — E1169 Type 2 diabetes mellitus with other specified complication: Secondary | ICD-10-CM

## 2023-05-24 MED ORDER — TIRZEPATIDE 15 MG/0.5ML ~~LOC~~ SOAJ
15.0000 mg | SUBCUTANEOUS | 0 refills | Status: DC
  Filled 2023-05-24 – 2023-05-26 (×2): qty 2, 28d supply, fill #0

## 2023-05-24 NOTE — Progress Notes (Signed)
.smr  Office: [phone]  /  Fax: [phone]  WEIGHT SUMMARY AND BIOMETRICS  Anthropometric Measurements Height: 5\' 6"  (1.676 m) Weight: 189 lb (85.7 kg) BMI (Calculated): 30.52 Weight at Last Visit: 187 lb Weight Lost Since Last Visit: 0 Weight Gained Since Last Visit: 2 lb Starting Weight: 237 lb Total Weight Loss (lbs): 49 lb (22.2 kg)   Body Composition  Body Fat %: 41.5 % Fat Mass (lbs): 78.8 lbs Muscle Mass (lbs): 105.4 lbs Total Body Water (lbs): 73.2 lbs Visceral Fat Rating : 12   Other Clinical Data Fasting: no Labs: no Today's Visit #: 51 Starting Date: 04/16/19    Chief Complaint: OBESITY   History of Present Illness   Deanna Rowe is a 68 year old female with obesity and type two diabetes who presents for management of her conditions.  She is currently on Mounjaro for type two diabetes, and her blood sugars have stabilized with some improvement after consuming nighttime snacks like cheese and milk. Previously, she experienced hypoglycemia with blood sugars dropping to the fifties, but this has improved. No nausea with Mounjaro use.  She has gained two pounds over the past month. She follows a category two diet about half the time and is working on diet and exercise to improve her blood sugar levels and manage her obesity. She recently returned from a cruise where she managed to avoid the buffet and opted for the dining room to make healthier food choices. She attends a Parkinson's exercise class three times a week, which she feels helps with her strength, flexibility, and energy levels.  She has a history of Parkinson's disease, which primarily affects her balance. She experienced a fall last week, though not during her recent cruise. No lightheadedness, which she associates with her Parkinson's. She is actively participating in a Parkinson's exercise class and engages in daily activities like cleaning to stay active.  She experiences constipation,  which has been a persistent issue even before starting Mounjaro. She is taking MiraLAX once a day and occasionally uses Ex lax when needed.   Her BP had been running low last month and her dose of Norvasc had been stopped. BP still well controlled but not as low as previous levels          PHYSICAL EXAM:  Blood pressure 117/64, pulse 67, temperature 98 F (36.7 C), height 5\' 6"  (1.676 m), weight 189 lb (85.7 kg), SpO2 98%. Body mass index is 30.51 kg/m.  DIAGNOSTIC DATA REVIEWED:  BMET    Component Value Date/Time   NA 128 (L) 08/27/2022 2108   NA 138 04/05/2022 0942   NA 142 01/12/2017 0914   K 3.9 08/27/2022 2108   K 4.2 01/12/2017 0914   CL 96 (L) 08/27/2022 2108   CO2 24 08/27/2022 2108   CO2 28 01/12/2017 0914   GLUCOSE 84 08/27/2022 2108   GLUCOSE 142 (H) 01/12/2017 0914   BUN 16 08/27/2022 2108   BUN 19 04/05/2022 0942   BUN 12.0 01/12/2017 0914   CREATININE 1.26 (H) 08/27/2022 2108   CREATININE 1.58 (H) 10/12/2018 1121   CREATININE 1.3 (H) 01/12/2017 0914   CALCIUM 8.7 (L) 08/27/2022 2108   CALCIUM 9.6 01/12/2017 0914   GFRNONAA 47 (L) 08/27/2022 2108   GFRNONAA 34 (L) 10/12/2018 1121   GFRAA 44 (L) 03/02/2020 0920   GFRAA 40 (L) 10/12/2018 1121   Lab Results  Component Value Date   HGBA1C 7.9 (H) 04/16/2019   HGBA1C 7.3 09/04/2014   Lab  Results  Component Value Date   INSULIN 12.3 04/16/2019   Lab Results  Component Value Date   TSH 1.200 04/05/2022   CBC    Component Value Date/Time   WBC 5.4 05/15/2023 1149   WBC 6.9 08/27/2022 2108   RBC 3.69 (L) 05/15/2023 1149   RBC 3.54 (L) 08/27/2022 2108   HGB 12.3 05/15/2023 1149   HGB 11.4 (L) 01/12/2017 0913   HCT 36.4 05/15/2023 1149   HCT 35.2 01/12/2017 0913   PLT 242 05/15/2023 1149   MCV 99 (H) 05/15/2023 1149   MCV 100.9 01/12/2017 0913   MCH 33.3 (H) 05/15/2023 1149   MCH 33.6 08/27/2022 2108   MCHC 33.8 05/15/2023 1149   MCHC 34.1 08/27/2022 2108   RDW 12.8 05/15/2023 1149   RDW  14.1 01/12/2017 0913   Iron Studies No results found for: "IRON", "TIBC", "FERRITIN", "IRONPCTSAT" Lipid Panel     Component Value Date/Time   CHOL 155 04/16/2019 1451   TRIG 71 04/16/2019 1451   HDL 84 04/16/2019 1451   LDLCALC 57 04/16/2019 1451   Hepatic Function Panel     Component Value Date/Time   PROT 7.2 08/27/2022 2108   PROT 7.1 04/05/2022 0942   PROT 7.2 01/12/2017 0914   ALBUMIN 3.5 08/27/2022 2108   ALBUMIN 4.3 04/05/2022 0942   ALBUMIN 3.5 01/12/2017 0914   AST 18 08/27/2022 2108   AST 16 10/12/2018 1121   AST 19 01/12/2017 0914   ALT 5 08/27/2022 2108   ALT <6 10/12/2018 1121   ALT 18 01/12/2017 0914   ALKPHOS 47 08/27/2022 2108   ALKPHOS 43 01/12/2017 0914   BILITOT 0.4 08/27/2022 2108   BILITOT 0.3 04/05/2022 0942   BILITOT 0.2 (L) 10/12/2018 1121   BILITOT 0.28 01/12/2017 0914      Component Value Date/Time   TSH 1.200 04/05/2022 0942   Nutritional Lab Results  Component Value Date   VD25OH 44.8 12/17/2019   VD25OH 40.9 04/16/2019     Assessment and Plan    Type 2 Diabetes Mellitus Type 2 Diabetes Mellitus managed with Mounjaro, diet, and exercise. Recent dose adjustment due to hypoglycemia. Blood sugars improved with current regimen and nighttime snacks. Discussed increasing Mounjaro to 15 mg with continued hypoglycemia monitoring. Emphasized maintaining diagnosis for insurance purposes. - Increase Mounjaro to 15 mg - Continue nighttime snacks to prevent hypoglycemia - [facility] [place] - Schedule follow-up appointments in March and April  Obesity Obesity with recent weight gain of two pounds. Following category two diet intermittently. Discussed importance of meal planning and prepping post-vacation. Patient prefers focusing on meal planning and prepping for weight management. - Encourage meal planning and prepping - Monitor weight  Parkinson's Disease Parkinson's Disease with primary symptom of balance issues.  Experienced a fall last week but none during recent cruise. Participates in Parkinson's exercise class three times a week, aiding strength and flexibility. Balance remains challenging. - Continue Parkinson's exercise class three times a week - Monitor for falls and balance issues  Constipation Chronic constipation managed with daily Miralax and occasional Exlax. Discussed non-addictive nature of Miralax and potential dependency on stimulant laxatives like Exlax. Goal is softer, less painful bowel movements rather than daily movements. - Continue MiraLAX daily - Use Ex lax as needed, not regularly - Increase MiraLAX to twice daily if needed   HTN Pt is off Norvasc but still on Propranolol and losartan, BP improved -Continue current medications and will monitor her BP monthly  General Health Maintenance Emphasis on diet, exercise, and routine follow-up in managing diabetes and obesity. - Encourage regular physical activity - Monitor blood pressure and blood sugars  Follow-up - Schedule follow-up appointments in March and April - Consider extending follow-up interval to six weeks if stable at next visit.       She was informed of the importance of frequent follow up visits to maximize her success with intensive lifestyle modifications for her multiple health conditions.    Quillian Quince, MD

## 2023-05-25 ENCOUNTER — Other Ambulatory Visit (HOSPITAL_BASED_OUTPATIENT_CLINIC_OR_DEPARTMENT_OTHER): Payer: Self-pay

## 2023-05-26 ENCOUNTER — Other Ambulatory Visit: Payer: Self-pay

## 2023-05-26 ENCOUNTER — Other Ambulatory Visit (HOSPITAL_BASED_OUTPATIENT_CLINIC_OR_DEPARTMENT_OTHER): Payer: Self-pay

## 2023-05-26 ENCOUNTER — Other Ambulatory Visit (HOSPITAL_COMMUNITY): Payer: Self-pay

## 2023-05-30 DIAGNOSIS — I493 Ventricular premature depolarization: Secondary | ICD-10-CM | POA: Diagnosis not present

## 2023-05-31 ENCOUNTER — Other Ambulatory Visit (HOSPITAL_BASED_OUTPATIENT_CLINIC_OR_DEPARTMENT_OTHER): Payer: Self-pay

## 2023-05-31 ENCOUNTER — Other Ambulatory Visit: Payer: Self-pay | Admitting: Interventional Cardiology

## 2023-05-31 MED ORDER — PROPRANOLOL HCL 40 MG PO TABS
40.0000 mg | ORAL_TABLET | Freq: Two times a day (BID) | ORAL | 3 refills | Status: DC
  Filled 2023-06-05: qty 180, 90d supply, fill #0

## 2023-05-31 MED ORDER — SOLIFENACIN SUCCINATE 10 MG PO TABS: 10.0000 mg | ORAL_TABLET | Freq: Every day | ORAL | 11 refills | Status: DC

## 2023-06-02 ENCOUNTER — Ambulatory Visit (HOSPITAL_COMMUNITY): Payer: Commercial Managed Care - PPO | Attending: Cardiovascular Disease

## 2023-06-02 ENCOUNTER — Encounter: Payer: Self-pay | Admitting: Physician Assistant

## 2023-06-02 DIAGNOSIS — G20A1 Parkinson's disease without dyskinesia, without mention of fluctuations: Secondary | ICD-10-CM | POA: Insufficient documentation

## 2023-06-02 DIAGNOSIS — R55 Syncope and collapse: Secondary | ICD-10-CM | POA: Diagnosis not present

## 2023-06-02 DIAGNOSIS — Z8673 Personal history of transient ischemic attack (TIA), and cerebral infarction without residual deficits: Secondary | ICD-10-CM | POA: Insufficient documentation

## 2023-06-02 DIAGNOSIS — R002 Palpitations: Secondary | ICD-10-CM | POA: Diagnosis not present

## 2023-06-02 DIAGNOSIS — I959 Hypotension, unspecified: Secondary | ICD-10-CM | POA: Diagnosis not present

## 2023-06-02 DIAGNOSIS — I493 Ventricular premature depolarization: Secondary | ICD-10-CM | POA: Insufficient documentation

## 2023-06-02 DIAGNOSIS — E785 Hyperlipidemia, unspecified: Secondary | ICD-10-CM | POA: Diagnosis not present

## 2023-06-02 DIAGNOSIS — I10 Essential (primary) hypertension: Secondary | ICD-10-CM | POA: Insufficient documentation

## 2023-06-02 DIAGNOSIS — R0602 Shortness of breath: Secondary | ICD-10-CM | POA: Diagnosis not present

## 2023-06-02 DIAGNOSIS — R42 Dizziness and giddiness: Secondary | ICD-10-CM | POA: Diagnosis not present

## 2023-06-02 DIAGNOSIS — E119 Type 2 diabetes mellitus without complications: Secondary | ICD-10-CM | POA: Insufficient documentation

## 2023-06-02 LAB — ECHOCARDIOGRAM COMPLETE
Area-P 1/2: 3.24 cm2
S' Lateral: 2.5 cm

## 2023-06-05 ENCOUNTER — Other Ambulatory Visit: Payer: Self-pay

## 2023-06-05 ENCOUNTER — Other Ambulatory Visit (HOSPITAL_BASED_OUTPATIENT_CLINIC_OR_DEPARTMENT_OTHER): Payer: Self-pay

## 2023-06-05 NOTE — Telephone Encounter (Signed)
 I called the pt and we went over her Echo results and she reports that when she walks up the stairs she gets very SOB and almost passes out. She denies Chest Pain just says she gets very "short winded"... she says this is new for her and worsening over the past 3-4 weeks.   I will send to [facility] to see if she would like any further testing with the added information.   Pt has 07/27/23 OV with Ronie Spies PA.

## 2023-06-06 ENCOUNTER — Encounter: Payer: Self-pay | Admitting: Physician Assistant

## 2023-06-08 ENCOUNTER — Other Ambulatory Visit (HOSPITAL_BASED_OUTPATIENT_CLINIC_OR_DEPARTMENT_OTHER): Payer: Self-pay

## 2023-06-12 ENCOUNTER — Other Ambulatory Visit: Payer: Self-pay

## 2023-06-12 ENCOUNTER — Other Ambulatory Visit (HOSPITAL_BASED_OUTPATIENT_CLINIC_OR_DEPARTMENT_OTHER): Payer: Self-pay

## 2023-06-13 ENCOUNTER — Other Ambulatory Visit (HOSPITAL_BASED_OUTPATIENT_CLINIC_OR_DEPARTMENT_OTHER): Payer: Self-pay

## 2023-06-14 ENCOUNTER — Other Ambulatory Visit: Payer: Self-pay | Admitting: Family Medicine

## 2023-06-14 DIAGNOSIS — Z1231 Encounter for screening mammogram for malignant neoplasm of breast: Secondary | ICD-10-CM

## 2023-06-16 ENCOUNTER — Other Ambulatory Visit (HOSPITAL_BASED_OUTPATIENT_CLINIC_OR_DEPARTMENT_OTHER): Payer: Self-pay

## 2023-06-16 ENCOUNTER — Other Ambulatory Visit: Payer: Self-pay

## 2023-06-16 MED ORDER — BUPROPION HCL ER (XL) 150 MG PO TB24
450.0000 mg | ORAL_TABLET | Freq: Every morning | ORAL | 0 refills | Status: DC
  Filled 2023-06-16: qty 270, 90d supply, fill #0

## 2023-06-19 ENCOUNTER — Other Ambulatory Visit: Payer: Self-pay

## 2023-06-19 NOTE — Progress Notes (Signed)
 Specialty Pharmacy Refill Coordination Note  Deanna Rowe is a 68 y.o. female contacted today regarding refills of specialty medication(s) Omalizumab Geoffry [place])   Patient requested [name] at [facility] at [place] date: 06/22/23   Medication will be filled on 06/21/23.

## 2023-06-20 ENCOUNTER — Other Ambulatory Visit (HOSPITAL_BASED_OUTPATIENT_CLINIC_OR_DEPARTMENT_OTHER): Payer: Self-pay

## 2023-06-21 ENCOUNTER — Other Ambulatory Visit: Payer: Self-pay

## 2023-06-21 ENCOUNTER — Other Ambulatory Visit (HOSPITAL_COMMUNITY): Payer: Self-pay

## 2023-06-21 ENCOUNTER — Other Ambulatory Visit (HOSPITAL_BASED_OUTPATIENT_CLINIC_OR_DEPARTMENT_OTHER): Payer: Self-pay

## 2023-06-22 ENCOUNTER — Other Ambulatory Visit (HOSPITAL_BASED_OUTPATIENT_CLINIC_OR_DEPARTMENT_OTHER): Payer: Self-pay

## 2023-06-22 ENCOUNTER — Other Ambulatory Visit (INDEPENDENT_AMBULATORY_CARE_PROVIDER_SITE_OTHER): Payer: Self-pay | Admitting: Family Medicine

## 2023-06-22 ENCOUNTER — Other Ambulatory Visit (HOSPITAL_COMMUNITY): Payer: Self-pay

## 2023-06-23 ENCOUNTER — Other Ambulatory Visit (HOSPITAL_COMMUNITY): Payer: Self-pay

## 2023-06-23 ENCOUNTER — Other Ambulatory Visit (HOSPITAL_BASED_OUTPATIENT_CLINIC_OR_DEPARTMENT_OTHER): Payer: Self-pay

## 2023-06-23 ENCOUNTER — Other Ambulatory Visit: Payer: Self-pay

## 2023-06-26 ENCOUNTER — Other Ambulatory Visit (HOSPITAL_BASED_OUTPATIENT_CLINIC_OR_DEPARTMENT_OTHER): Payer: Self-pay

## 2023-06-26 ENCOUNTER — Ambulatory Visit (INDEPENDENT_AMBULATORY_CARE_PROVIDER_SITE_OTHER): Payer: Commercial Managed Care - PPO | Admitting: Internal Medicine

## 2023-06-26 ENCOUNTER — Other Ambulatory Visit: Payer: Self-pay

## 2023-06-26 VITALS — BP 130/77 | HR 76 | Temp 97.8°F | Resp 18 | Ht 66.0 in | Wt 192.7 lb

## 2023-06-26 DIAGNOSIS — J3089 Other allergic rhinitis: Secondary | ICD-10-CM | POA: Diagnosis not present

## 2023-06-26 DIAGNOSIS — J4540 Moderate persistent asthma, uncomplicated: Secondary | ICD-10-CM

## 2023-06-26 DIAGNOSIS — K219 Gastro-esophageal reflux disease without esophagitis: Secondary | ICD-10-CM

## 2023-06-26 NOTE — Patient Instructions (Addendum)
 Asthma: well controlled  Breathing test: looked great!  Continue albuterol 2 puffs every 4 hours as needed for cough, wheeze, tightness in chest, or shortness of breath Continue Xolair injections 300 mg once every 4 weeks and have access to an epinephrine auto-injector set.    Allergic rhinitis: well controlled;  Continue Nasacort 2 sprays in each nostril once a day as needed for stuffy nose Continue saline nasal rinses as needed for nasal symptoms. Use this before any medicated nasal sprays for best result Continue allergen avoidance measures directed toward dust mites, cat, and dog as listed below   Reflux: improved  Continue dietary and lifestyle modifications Please let us know if this treatment plan is not working well for you  Follow up: 6 month  Thank you so much for letting me partake in your care today.  Don't hesitate to reach out if you have any additional concerns!  Ferol Luz, MD  [facility] [place] [place]

## 2023-06-26 NOTE — Addendum Note (Signed)
 Addended by: Maryjean Morn D on: 06/26/2023 05:07 PM   Modules accepted: Orders

## 2023-06-26 NOTE — Progress Notes (Signed)
 Follow Up Note  RE: Deanna Rowe MRN: [id number] DOB: [birth year] Date of Office Visit: 06/26/2023  Referring provider: Clayborn Heron, MD Primary care provider: Clayborn Heron, MD  Chief Complaint: Follow-up (6 month follow up used rescue inhaler 2 weeks ago. )  History of Present Illness: I had the pleasure of seeing Deanna Rowe for a follow up visit at the [facility] on 06/26/2023. She is a 68 y.o. female, who is being followed for persistent asthma, allergic rhinitis, reflux allergic conjunctivitis . Her previous allergy office visit was on  12/27/22 with Dr. Marlynn Perking. Today is a regular follow up visit.  History obtained from patient, chart review.  At last visit FEV1: 1.97 L, 94% predicted  Discussed the use of AI scribe software for clinical note transcription with the patient, who gave verbal consent to proceed.  History of Present Illness Deanna Rowe is a 68 year old female with asthma and allergic rhinitis who presents for follow-up of her respiratory conditions.  Asthma is well controlled with the use of Symbicort inhaler as needed, approximately every two weeks, which effectively relieves symptoms of cough, wheeze, and shortness of breath. She continues to receive Xolair injections without any issues and has not required any hospitalizations, ER visits, or antibiotics for respiratory issues recently.  She experiences a runny nose, which she attributes to rhinitis. She has Nasacort nasal spray available but has not been using it regularly. She is not taking any oral allergy medications such as Xyzal, Allegra, or Claritin, as she did not help her symptoms in the past.  Her gastroesophageal reflux symptoms are reportedly better with the use of an elevated bed.    Assessment and Plan: [name] is a 68 y.o. female with: Moderate persistent asthma without complication  Perennial allergic rhinitis  Gastroesophageal reflux disease without  esophagitis Plan: Patient Instructions  Asthma: well controlled  Breathing test: looked great!  Continue albuterol 2 puffs every 4 hours as needed for cough, wheeze, tightness in chest, or shortness of breath Continue Xolair injections 300 mg once every 4 weeks and have access to an epinephrine auto-injector set.    Allergic rhinitis: well controlled;  Continue Nasacort 2 sprays in each nostril once a day as needed for stuffy nose Continue saline nasal rinses as needed for nasal symptoms. Use this before any medicated nasal sprays for best result Continue allergen avoidance measures directed toward dust mites, cat, and dog as listed below   Reflux: improved  Continue dietary and lifestyle modifications Please let us know if this treatment plan is not working well for you  Follow up: 6 month  Thank you so much for letting me partake in your care today.  Don't hesitate to reach out if you have any additional concerns!  Ferol Luz, MD  [facility] [place] [place]     No follow-ups on file.  No orders of the defined types were placed in this encounter.   Lab Orders  No laboratory test(s) ordered today   Diagnostics: Spirometry:  Tracings reviewed. Her effort: Good reproducible efforts. FVC: 1.99 L FEV1: 1.64 L, 79% predicted FEV1/FVC ratio: 82% Interpretation: Spirometry consistent with normal pattern.  Please see scanned spirometry results for details.    Medication List:  Current Outpatient Medications  Medication Sig Dispense Refill   ACCU-CHEK FASTCLIX LANCETS MISC USE TO CHECK BLOOD SUGAR 3 TIMES A DAY 102 each 0   ACCU-CHEK GUIDE test strip USE TO CHECK BLOOD  SUGAR 3 TIMES PER DAY. (Patient taking differently: Use to check blood sugar 4 times per day.) 100 each 2   albuterol (VENTOLIN HFA) 108 (90 Base) MCG/ACT inhaler Inhale 2 puffs into the lungs as needed for wheezing or shortness of breath. 18 g 2   amLODipine (NORVASC) 5 MG tablet Take 5  mg by mouth daily.     aspirin EC 81 MG tablet Take 81 mg by mouth every evening.     atorvastatin (LIPITOR) 10 MG tablet Take 1 tablet (10 mg total) by mouth at bedtime. 90 tablet 3   azelastine (ASTELIN) 0.1 % nasal spray Place 2 sprays into both nostrils 2 (two) times daily as needed. 30 mL 5   brexpiprazole (REXULTI) 2 MG TABS tablet Take 1 tablet (2 mg total) by mouth every morning. 90 tablet 1   buPROPion (WELLBUTRIN XL) 150 MG 24 hr tablet Take 3 tablets (450 mg total) by mouth in the morning. 270 tablet 0   buPROPion (WELLBUTRIN XL) 150 MG 24 hr tablet Take 3 tablets (450 mg total) by mouth every morning. 270 tablet 0   Calcium Carbonate-Vitamin D 600-400 MG-UNIT tablet Take 1 tablet by mouth daily.     carbidopa-levodopa (SINEMET CR) 50-200 MG tablet Take 1 tablet by mouth 5 (five) times daily. 450 tablet 3   carbidopa-levodopa (SINEMET) 25-250 MG tablet Take 1 tablet by mouth every morning AND 1 tablet daily in the afternoon at 2 pm. 180 tablet 1   Continuous Blood Gluc Receiver (DEXCOM G7 RECEIVER) DEVI Use as directed to check blood sugar 1 each 1   Continuous Blood Gluc Sensor (DEXCOM G7 SENSOR) MISC Use to check blood sugar and change sensor every 10 days 9 each 4   dapagliflozin propanediol (FARXIGA) 5 MG TABS tablet Take 1 tablet (5 mg total) by mouth daily. 90 tablet 4   EPINEPHrine (EPIPEN 2-PAK) 0.3 mg/0.3 mL IJ SOAJ injection Inject 0.3 mg into the muscle as needed for anaphylaxis. 2 each 1   gabapentin (NEURONTIN) 300 MG capsule Take 1 capsule (300 mg total) by mouth 3 (three) times daily. 270 capsule 0   glucose blood test strip Use to check blood sugar 4 times daily 400 each 3   LORazepam (ATIVAN) 2 MG tablet Take 1 tablet (2 mg total) by mouth 3 (three) times daily. 270 tablet 1   losartan (COZAAR) 50 MG tablet Take 1 tablet (50 mg total) by mouth daily. 90 tablet 3   Olopatadine HCl (PATADAY) 0.2 % SOLN Place 1 drop into both eyes daily as needed. 2.5 mL 5   omalizumab  (XOLAIR) 150 MG/ML prefilled syringe Inject 300 mg into the skin every 28 (twenty-eight) days. 2 mL 11   oxybutynin (DITROPAN-XL) 10 MG 24 hr tablet Take 1 tablet (10 mg total) by mouth daily. 30 tablet 11   propranolol (INDERAL) 40 MG tablet Take 1 tablet (40 mg total) by mouth 2 (two) times daily. 180 tablet 3   rOPINIRole (REQUIP) 2 MG tablet Take 1 tablet (2 mg total) by mouth 2 (two) times daily. 360 tablet 1   solifenacin (VESICARE) 10 MG tablet Take 1 tablet (10 mg total) by mouth daily. 30 tablet 11   tirzepatide (MOUNJARO) 15 MG/0.5ML Pen Inject 15 mg into the skin once a week. 2 mL 0   triamcinolone (NASACORT) 55 MCG/ACT AERO nasal inhaler Place 2 sprays into the nose daily. 16.9 mL 5   valACYclovir (VALTREX) 500 MG tablet Take 1 tablet (500 mg total) by  mouth daily. 90 tablet 4   vitamin C (ASCORBIC ACID) 500 MG tablet Take 500 mg by mouth daily.     Vitamin D, Ergocalciferol, 2000 units CAPS Take 1 capsule by mouth daily. 2000 units daily.     vortioxetine HBr (TRINTELLIX) 20 MG TABS tablet Take 1 tablet (20 mg total) by mouth every morning. 90 tablet 1   zolpidem (AMBIEN) 5 MG tablet Take 1 tablet (5 mg total) by mouth at bedtime. 90 tablet 1   No current facility-administered medications for this visit.   Facility-Administered Medications Ordered in Other Visits  Medication Dose Route Frequency Provider Last Rate Last Admin   tranexamic acid (CYKLOKAPRON) topical -INTRAOP  2,000 mg Topical Once [place], [facility], PA-C       Allergies: Allergies  Allergen Reactions   Fluticasone-Salmeterol Anaphylaxis   Codeine Nausea And Vomiting   Fetzima [Levomilnacipran] Nausea And Vomiting   Nsaids Other (See Comments)    Upset stomach    Trulicity [Dulaglutide] Nausea And Vomiting    Significant and undesirably rapid (40 lbs) weight loss    Xanax [Alprazolam] Other (See Comments)    disoriented   Amoxicillin Rash    Has patient had a PCN reaction causing immediate rash,  facial/tongue/throat swelling, SOB or lightheadedness with hypotension: Yes Has patient had a PCN reaction causing severe rash involving mucus membranes or skin necrosis: No Has patient had a PCN reaction that required hospitalization: No Has patient had a PCN reaction occurring within the last 10 years: No If all of the above answers are "NO", then may proceed with Cephalosporin use.    Pseudoephedrine Hcl Er Palpitations   I reviewed her past medical history, social history, family history, and environmental history and no significant changes have been reported from her previous visit.  ROS: All others negative except as noted per HPI.   Objective: BP 130/77   Pulse 76   Temp 97.8 F (36.6 C) (Temporal)   Resp 18   Ht 5\' 6"  (1.676 m)   Wt 192 lb 11.2 oz (87.4 kg)   SpO2 95%   BMI 31.10 kg/m  Body mass index is 31.1 kg/m. General Appearance:  Alert, cooperative, no distress, appears stated age  Head:  Normocephalic, without obvious abnormality, atraumatic  Eyes:  Conjunctiva clear, EOM's intact  Nose: Nares normal, normal mucosa, no visible anterior polyps, and septum midline  Throat: Lips, tongue normal; teeth and gums normal, normal posterior oropharynx and no tonsillar exudate  Neck: Supple, symmetrical  Lungs:   clear to auscultation bilaterally, Respirations unlabored, no coughing  Heart:  regular rate and rhythm and no murmur, Appears well perfused  Extremities: No edema  Skin: Skin color, texture, turgor normal, no rashes or lesions on visualized portions of skin   Neurologic: No gross deficits   Previous notes and tests were reviewed. The plan was reviewed with the patient/family, and all questions/concerned were addressed.  It was my pleasure to see Mailen today and participate in her care. Please feel free to contact me with any questions or concerns.  Sincerely,  Ferol Luz, MD  Allergy & Immunology  [facility] Office: [phone]

## 2023-06-27 ENCOUNTER — Other Ambulatory Visit (HOSPITAL_BASED_OUTPATIENT_CLINIC_OR_DEPARTMENT_OTHER): Payer: Self-pay

## 2023-06-27 ENCOUNTER — Other Ambulatory Visit: Payer: Self-pay

## 2023-06-28 ENCOUNTER — Encounter (INDEPENDENT_AMBULATORY_CARE_PROVIDER_SITE_OTHER): Payer: Self-pay | Admitting: Family Medicine

## 2023-06-28 ENCOUNTER — Other Ambulatory Visit: Payer: Self-pay

## 2023-06-28 ENCOUNTER — Other Ambulatory Visit (HOSPITAL_BASED_OUTPATIENT_CLINIC_OR_DEPARTMENT_OTHER): Payer: Self-pay

## 2023-06-28 ENCOUNTER — Ambulatory Visit (INDEPENDENT_AMBULATORY_CARE_PROVIDER_SITE_OTHER): Payer: Commercial Managed Care - PPO | Admitting: Family Medicine

## 2023-06-28 VITALS — BP 114/71 | HR 72 | Temp 97.9°F | Ht 66.0 in | Wt 186.0 lb

## 2023-06-28 DIAGNOSIS — Z794 Long term (current) use of insulin: Secondary | ICD-10-CM | POA: Diagnosis not present

## 2023-06-28 DIAGNOSIS — Z7985 Long-term (current) use of injectable non-insulin antidiabetic drugs: Secondary | ICD-10-CM

## 2023-06-28 DIAGNOSIS — Z6830 Body mass index (BMI) 30.0-30.9, adult: Secondary | ICD-10-CM

## 2023-06-28 DIAGNOSIS — I10 Essential (primary) hypertension: Secondary | ICD-10-CM

## 2023-06-28 DIAGNOSIS — E1169 Type 2 diabetes mellitus with other specified complication: Secondary | ICD-10-CM

## 2023-06-28 DIAGNOSIS — E669 Obesity, unspecified: Secondary | ICD-10-CM | POA: Diagnosis not present

## 2023-06-28 DIAGNOSIS — Z7984 Long term (current) use of oral hypoglycemic drugs: Secondary | ICD-10-CM | POA: Diagnosis not present

## 2023-06-28 DIAGNOSIS — E119 Type 2 diabetes mellitus without complications: Secondary | ICD-10-CM | POA: Diagnosis not present

## 2023-06-28 MED ORDER — TIRZEPATIDE 15 MG/0.5ML ~~LOC~~ SOAJ
15.0000 mg | SUBCUTANEOUS | 0 refills | Status: DC
  Filled 2023-06-28: qty 2, 28d supply, fill #0

## 2023-06-28 NOTE — Progress Notes (Signed)
 Office: [phone]  /  Fax: [phone]  WEIGHT SUMMARY AND BIOMETRICS  Anthropometric Measurements Height: 5\' 6"  (1.676 m) Weight: 186 lb (84.4 kg) BMI (Calculated): 30.04 Weight at Last Visit: 189 lb Weight Lost Since Last Visit: 3 lb Weight Gained Since Last Visit: 0 Starting Weight: 237 lb Total Weight Loss (lbs): 52 lb (23.6 kg)   Body Composition  Body Fat %: 40.3 % Fat Mass (lbs): 75.2 lbs Muscle Mass (lbs): 105.6 lbs Total Body Water (lbs): 71.4 lbs Visceral Fat Rating : 11   Other Clinical Data Fasting: No Labs: No Today's Visit #: 65 Starting Date: 04/16/19    Chief Complaint: OBESITY    History of Present Illness   Deanna Rowe is a 68 year old female with obesity and type 2 diabetes who presents for obesity treatment and progress assessment.  She has been adhering to her category two eating plan 75% of the time and participates in a Parkinson's exercise class at [facility] for 60 minutes three times a week. She has lost three pounds in the last five weeks. She reports not feeling hungry and is maintaining her protein intake. She manages cravings by keeping cheese and protein snacks available at night and consuming protein cereal. She acknowledges having a 'little bit' of a sweet tooth.  Her type 2 diabetes is managed with Comoros and Mounjaro. She requests a refill of Mounjaro and reports no issues with it, such as low blood sugars or feeling hot, weak, or shaky. She continues to take Comoros and notes frequent urination, which she attributes to her existing bladder incontinence. She urinates at least three to four times at night, sometimes up to six times.  She experiences urinary incontinence, which is managed by Dr. Jacquelyne Balint. She reports frequent urination at night, which may be exacerbated by Comoros.   She decreased her dose of Norvasc as directed due to low BP with weight loss. BP is well controlled today.         PHYSICAL EXAM:  Blood  pressure 114/71, pulse 72, temperature 97.9 F (36.6 C), height 5\' 6"  (1.676 m), weight 186 lb (84.4 kg), SpO2 100%. Body mass index is 30.02 kg/m.  DIAGNOSTIC DATA REVIEWED:  BMET    Component Value Date/Time   NA 128 (L) 08/27/2022 2108   NA 138 04/05/2022 0942   NA 142 01/12/2017 0914   K 3.9 08/27/2022 2108   K 4.2 01/12/2017 0914   CL 96 (L) 08/27/2022 2108   CO2 24 08/27/2022 2108   CO2 28 01/12/2017 0914   GLUCOSE 84 08/27/2022 2108   GLUCOSE 142 (H) 01/12/2017 0914   BUN 16 08/27/2022 2108   BUN 19 04/05/2022 0942   BUN 12.0 01/12/2017 0914   CREATININE 1.26 (H) 08/27/2022 2108   CREATININE 1.58 (H) 10/12/2018 1121   CREATININE 1.3 (H) 01/12/2017 0914   CALCIUM 8.7 (L) 08/27/2022 2108   CALCIUM 9.6 01/12/2017 0914   GFRNONAA 47 (L) 08/27/2022 2108   GFRNONAA 34 (L) 10/12/2018 1121   GFRAA 44 (L) 03/02/2020 0920   GFRAA 40 (L) 10/12/2018 1121   Lab Results  Component Value Date   HGBA1C 7.9 (H) 04/16/2019   HGBA1C 7.3 09/04/2014   Lab Results  Component Value Date   INSULIN 12.3 04/16/2019   Lab Results  Component Value Date   TSH 1.200 04/05/2022   CBC    Component Value Date/Time   WBC 5.4 05/15/2023 1149   WBC 6.9 08/27/2022 2108   RBC 3.69 (L)  05/15/2023 1149   RBC 3.54 (L) 08/27/2022 2108   HGB 12.3 05/15/2023 1149   HGB 11.4 (L) 01/12/2017 0913   HCT 36.4 05/15/2023 1149   HCT 35.2 01/12/2017 0913   PLT 242 05/15/2023 1149   MCV 99 (H) 05/15/2023 1149   MCV 100.9 01/12/2017 0913   MCH 33.3 (H) 05/15/2023 1149   MCH 33.6 08/27/2022 2108   MCHC 33.8 05/15/2023 1149   MCHC 34.1 08/27/2022 2108   RDW 12.8 05/15/2023 1149   RDW 14.1 01/12/2017 0913   Iron Studies No results found for: "IRON", "TIBC", "FERRITIN", "IRONPCTSAT" Lipid Panel     Component Value Date/Time   CHOL 155 04/16/2019 1451   TRIG 71 04/16/2019 1451   HDL 84 04/16/2019 1451   LDLCALC 57 04/16/2019 1451   Hepatic Function Panel     Component Value Date/Time    PROT 7.2 08/27/2022 2108   PROT 7.1 04/05/2022 0942   PROT 7.2 01/12/2017 0914   ALBUMIN 3.5 08/27/2022 2108   ALBUMIN 4.3 04/05/2022 0942   ALBUMIN 3.5 01/12/2017 0914   AST 18 08/27/2022 2108   AST 16 10/12/2018 1121   AST 19 01/12/2017 0914   ALT 5 08/27/2022 2108   ALT <6 10/12/2018 1121   ALT 18 01/12/2017 0914   ALKPHOS 47 08/27/2022 2108   ALKPHOS 43 01/12/2017 0914   BILITOT 0.4 08/27/2022 2108   BILITOT 0.3 04/05/2022 0942   BILITOT 0.2 (L) 10/12/2018 1121   BILITOT 0.28 01/12/2017 0914      Component Value Date/Time   TSH 1.200 04/05/2022 0942   Nutritional Lab Results  Component Value Date   VD25OH 44.8 12/17/2019   VD25OH 40.9 04/16/2019     Assessment and Plan    Type 2 Diabetes Mellitus Type 2 diabetes is managed with Com[place] and Mounjaro. She reports no hypoglycemia with Mounjaro. Marcelline Deist aids in glycemic control and renal health but may exacerbate incontinence, which is managed by another provider. Greggory Keen is FDA approved for diabetes and supports weight loss. Marcelline Deist was initially FDA approved for diabetes and later for renal health. - Refill Mounjaro prescription - Continue Farxiga for glycemic and renal health - Monitor incontinence and report changes to healthcare provider  Obesity She is actively engaged in behavior modifications, following a category two eating plan 75% of the time and attending a Parkinson's exercise class at [facility] for 60 minutes three times a week. She has lost three pounds in the last five weeks. Reports good control over hunger and cravings, incorporating more protein into her diet. A new high-protein, low-sugar dessert recipe was provided to manage sweet cravings. - Continue current Category 2 eating plan and exercise regimen - Incorporate high-protein, low-sugar dessert to manage sweet cravings  Hypertension Hypertension is well-controlled with reduced medication. Blood pressure readings are satisfactory, indicating  effective management. she decresed her dose of Norvasc and is doing well. - Maintain current medication regimen - Monitor blood pressure  Follow-up Follow-up is advised to ensure continuity of care and monitor progress. Encouraged to send a MyChart message if any issues arise with medications or health concerns. - Schedule follow-up appointment in four weeks - Send MyChart message if any issues arise with medications or health concerns        She was informed of the importance of frequent follow up visits to maximize her success with intensive lifestyle modifications for her multiple health conditions.    Quillian Quince, MD

## 2023-06-29 ENCOUNTER — Other Ambulatory Visit (HOSPITAL_COMMUNITY): Payer: Self-pay

## 2023-06-30 ENCOUNTER — Other Ambulatory Visit: Payer: Self-pay

## 2023-06-30 ENCOUNTER — Other Ambulatory Visit (HOSPITAL_BASED_OUTPATIENT_CLINIC_OR_DEPARTMENT_OTHER): Payer: Self-pay

## 2023-07-03 ENCOUNTER — Telehealth: Payer: Self-pay

## 2023-07-03 ENCOUNTER — Other Ambulatory Visit: Payer: Self-pay

## 2023-07-03 NOTE — Telephone Encounter (Signed)
 Per call center- PA needed for Xolair

## 2023-07-04 ENCOUNTER — Other Ambulatory Visit: Payer: Self-pay

## 2023-07-04 ENCOUNTER — Other Ambulatory Visit (HOSPITAL_COMMUNITY): Payer: Self-pay

## 2023-07-04 ENCOUNTER — Other Ambulatory Visit: Payer: Self-pay | Admitting: Pharmacist

## 2023-07-04 ENCOUNTER — Other Ambulatory Visit: Payer: Self-pay | Admitting: *Deleted

## 2023-07-04 ENCOUNTER — Ambulatory Visit
Admission: RE | Admit: 2023-07-04 | Discharge: 2023-07-04 | Disposition: A | Discharge status: Home / Self Care | Source: Ambulatory Visit | Attending: Family Medicine | Admitting: Family Medicine

## 2023-07-04 DIAGNOSIS — Z1231 Encounter for screening mammogram for malignant neoplasm of breast: Secondary | ICD-10-CM

## 2023-07-04 MED ORDER — XOLAIR 300 MG/2ML ~~LOC~~ SOSY
300.0000 mg | PREFILLED_SYRINGE | SUBCUTANEOUS | 11 refills | Status: DC
  Filled 2023-07-04: qty 2, 28d supply, fill #0

## 2023-07-04 MED ORDER — XOLAIR 300 MG/2ML ~~LOC~~ SOSY
300.0000 mg | PREFILLED_SYRINGE | SUBCUTANEOUS | 11 refills | Status: AC
  Filled 2023-07-04: qty 2, 28d supply, fill #0
  Filled 2023-07-26 (×2): qty 2, 28d supply, fill #1
  Filled 2023-08-22: qty 2, 28d supply, fill #2
  Filled 2023-09-26: qty 2, 28d supply, fill #3
  Filled 2023-10-19: qty 2, 28d supply, fill #4
  Filled 2023-11-16: qty 2, 28d supply, fill #5
  Filled 2023-12-25: qty 2, 28d supply, fill #6
  Filled 2024-01-19: qty 2, 28d supply, fill #7
  Filled 2024-02-15 – 2024-02-21 (×2): qty 2, 28d supply, fill #8
  Filled 2024-03-18 – 2024-03-22 (×2): qty 2, 28d supply, fill #9
  Filled 2024-04-23 – 2024-04-29 (×2): qty 2, 28d supply, fill #10

## 2023-07-04 NOTE — Telephone Encounter (Signed)
 T/c to patient to advise PA done had changed to 300mg  SYR and forgot to send rx to go with new approval

## 2023-07-04 NOTE — Progress Notes (Signed)
 Patient was waiting for PA. & a new rx has been sent in. Call & Spoke with patient. PPU at [facility] 4/2

## 2023-07-04 NOTE — Telephone Encounter (Signed)
 Patient called and stated that she went to pharmacy to inquire about prescription for her Xolair. She was told that they are waiting for a PA from the doctor. Patient is requesting a call back about this issue. Patients call back number is [phone]

## 2023-07-05 ENCOUNTER — Other Ambulatory Visit: Payer: Self-pay

## 2023-07-06 ENCOUNTER — Other Ambulatory Visit (HOSPITAL_BASED_OUTPATIENT_CLINIC_OR_DEPARTMENT_OTHER): Payer: Self-pay

## 2023-07-06 DIAGNOSIS — R3914 Feeling of incomplete bladder emptying: Secondary | ICD-10-CM | POA: Diagnosis not present

## 2023-07-06 DIAGNOSIS — N3941 Urge incontinence: Secondary | ICD-10-CM | POA: Diagnosis not present

## 2023-07-06 MED ORDER — CIPROFLOXACIN HCL 250 MG PO TABS
250.0000 mg | ORAL_TABLET | Freq: Two times a day (BID) | ORAL | 0 refills | Status: AC
Start: 2023-07-06 — End: ?
  Filled 2023-07-06: qty 6, 3d supply, fill #0

## 2023-07-06 NOTE — Telephone Encounter (Signed)
 Spoke with patient and informed her of Tammy's note.

## 2023-07-10 ENCOUNTER — Other Ambulatory Visit (HOSPITAL_BASED_OUTPATIENT_CLINIC_OR_DEPARTMENT_OTHER): Payer: Self-pay

## 2023-07-10 ENCOUNTER — Other Ambulatory Visit: Payer: Self-pay | Admitting: Neurology

## 2023-07-13 ENCOUNTER — Other Ambulatory Visit: Payer: Self-pay

## 2023-07-20 ENCOUNTER — Other Ambulatory Visit (HOSPITAL_BASED_OUTPATIENT_CLINIC_OR_DEPARTMENT_OTHER): Payer: Self-pay

## 2023-07-20 MED ORDER — CARBIDOPA-LEVODOPA 25-250 MG PO TABS
ORAL_TABLET | ORAL | 1 refills | Status: DC
  Filled 2023-07-20 – 2023-10-19 (×2): qty 180, 90d supply, fill #0

## 2023-07-21 ENCOUNTER — Other Ambulatory Visit (INDEPENDENT_AMBULATORY_CARE_PROVIDER_SITE_OTHER): Payer: Self-pay | Admitting: Family Medicine

## 2023-07-21 ENCOUNTER — Other Ambulatory Visit (HOSPITAL_BASED_OUTPATIENT_CLINIC_OR_DEPARTMENT_OTHER): Payer: Self-pay

## 2023-07-21 DIAGNOSIS — Z794 Long term (current) use of insulin: Secondary | ICD-10-CM

## 2023-07-24 ENCOUNTER — Other Ambulatory Visit: Payer: Self-pay

## 2023-07-24 ENCOUNTER — Other Ambulatory Visit (HOSPITAL_BASED_OUTPATIENT_CLINIC_OR_DEPARTMENT_OTHER): Payer: Self-pay

## 2023-07-24 MED ORDER — CLINDAMYCIN HCL 300 MG PO CAPS
600.0000 mg | ORAL_CAPSULE | ORAL | 3 refills | Status: DC | PRN
  Filled 2023-07-24: qty 2, 1d supply, fill #0

## 2023-07-24 MED ORDER — DAPAGLIFLOZIN PROPANEDIOL 5 MG PO TABS
5.0000 mg | ORAL_TABLET | Freq: Every day | ORAL | 0 refills | Status: DC
  Filled 2023-07-24: qty 90, 90d supply, fill #0

## 2023-07-25 ENCOUNTER — Other Ambulatory Visit (HOSPITAL_BASED_OUTPATIENT_CLINIC_OR_DEPARTMENT_OTHER): Payer: Self-pay

## 2023-07-25 ENCOUNTER — Telehealth: Payer: Self-pay | Admitting: Internal Medicine

## 2023-07-25 ENCOUNTER — Telehealth (INDEPENDENT_AMBULATORY_CARE_PROVIDER_SITE_OTHER): Payer: Self-pay | Admitting: Family Medicine

## 2023-07-25 ENCOUNTER — Other Ambulatory Visit: Payer: Self-pay

## 2023-07-25 DIAGNOSIS — W19XXXA Unspecified fall, initial encounter: Secondary | ICD-10-CM | POA: Diagnosis not present

## 2023-07-25 DIAGNOSIS — S3992XA Unspecified injury of lower back, initial encounter: Secondary | ICD-10-CM | POA: Diagnosis not present

## 2023-07-25 DIAGNOSIS — M62830 Muscle spasm of back: Secondary | ICD-10-CM | POA: Diagnosis not present

## 2023-07-25 MED ORDER — EPINEPHRINE 0.3 MG/0.3ML IJ SOAJ
0.3000 mg | INTRAMUSCULAR | 1 refills | Status: AC | PRN
  Filled 2023-07-25: qty 2, 9d supply, fill #0
  Filled 2023-08-09: qty 2, 9d supply, fill #1

## 2023-07-25 MED ORDER — TIZANIDINE HCL 4 MG PO TABS
4.0000 mg | ORAL_TABLET | Freq: Three times a day (TID) | ORAL | 0 refills | Status: DC | PRN
  Filled 2023-07-25 (×2): qty 30, 10d supply, fill #0

## 2023-07-25 NOTE — Telephone Encounter (Signed)
 Patient called stating her Epi pen's have expired and she is needing a refill sent to [facility] in [place].

## 2023-07-25 NOTE — Telephone Encounter (Signed)
 Rx sent to pharmacy

## 2023-07-25 NOTE — Telephone Encounter (Signed)
 Good afternoon!  Having issues getting Monjaro prescription filled. The pharmacy says it is denied and there is something that needs to be cleared up by the doctor. She has an appointment tomorrow and says this has happened twice. The pharmacy is [facility] [place].   Thanks!

## 2023-07-26 ENCOUNTER — Encounter (INDEPENDENT_AMBULATORY_CARE_PROVIDER_SITE_OTHER): Payer: Self-pay

## 2023-07-26 ENCOUNTER — Encounter (INDEPENDENT_AMBULATORY_CARE_PROVIDER_SITE_OTHER): Payer: Self-pay | Admitting: Family Medicine

## 2023-07-26 ENCOUNTER — Other Ambulatory Visit: Payer: Self-pay

## 2023-07-26 ENCOUNTER — Ambulatory Visit (INDEPENDENT_AMBULATORY_CARE_PROVIDER_SITE_OTHER): Payer: Commercial Managed Care - PPO | Admitting: Family Medicine

## 2023-07-26 ENCOUNTER — Other Ambulatory Visit (HOSPITAL_BASED_OUTPATIENT_CLINIC_OR_DEPARTMENT_OTHER): Payer: Self-pay

## 2023-07-26 ENCOUNTER — Other Ambulatory Visit (HOSPITAL_COMMUNITY): Payer: Self-pay

## 2023-07-26 VITALS — BP 105/60 | HR 64 | Temp 97.4°F | Ht 66.0 in | Wt 178.0 lb

## 2023-07-26 DIAGNOSIS — E669 Obesity, unspecified: Secondary | ICD-10-CM

## 2023-07-26 DIAGNOSIS — G20C Parkinsonism, unspecified: Secondary | ICD-10-CM | POA: Diagnosis not present

## 2023-07-26 DIAGNOSIS — Z6828 Body mass index (BMI) 28.0-28.9, adult: Secondary | ICD-10-CM | POA: Diagnosis not present

## 2023-07-26 DIAGNOSIS — G20A1 Parkinson's disease without dyskinesia, without mention of fluctuations: Secondary | ICD-10-CM

## 2023-07-26 DIAGNOSIS — M6284 Sarcopenia: Secondary | ICD-10-CM

## 2023-07-26 DIAGNOSIS — E119 Type 2 diabetes mellitus without complications: Secondary | ICD-10-CM | POA: Diagnosis not present

## 2023-07-26 DIAGNOSIS — Z7985 Long-term (current) use of injectable non-insulin antidiabetic drugs: Secondary | ICD-10-CM

## 2023-07-26 DIAGNOSIS — Z7984 Long term (current) use of oral hypoglycemic drugs: Secondary | ICD-10-CM

## 2023-07-26 DIAGNOSIS — Z794 Long term (current) use of insulin: Secondary | ICD-10-CM

## 2023-07-26 MED ORDER — TIRZEPATIDE 12.5 MG/0.5ML ~~LOC~~ SOAJ
12.5000 mg | SUBCUTANEOUS | 0 refills | Status: DC
Start: 2023-07-26 — End: 2023-08-21
  Filled 2023-07-26 (×2): qty 6, 84d supply, fill #0

## 2023-07-26 NOTE — Progress Notes (Signed)
 Office: [phone]  /  Fax: [phone]  WEIGHT SUMMARY AND BIOMETRICS  Anthropometric Measurements Height: 5\' 6"  (1.676 m) Weight: 178 lb (80.7 kg) BMI (Calculated): 28.74 Weight at Last Visit: 186 lb Weight Lost Since Last Visit: 8 lb Weight Gained Since Last Visit: 0 Starting Weight: 237 lb Total Weight Loss (lbs): 60 lb (27.2 kg)   Body Composition  Body Fat %: 38.2 % Fat Mass (lbs): 68.2 lbs Muscle Mass (lbs): 104.8 lbs Total Body Water (lbs): 70.6 lbs Visceral Fat Rating : 10   Other Clinical Data Fasting: no Labs: no Today's Visit #: 28 Starting Date: 04/16/19    Chief Complaint: OBESITY   History of Present Illness Deanna Rowe is a 68 year old female with obesity and Parkinson's disease who presents for obesity treatment and progress assessment.  She has been following her category two eating plan approximately 75% of the time, focusing on high protein intake and adequate hydration. Despite these efforts, she has experienced an eight-pound weight loss over the past month, which is considered excessive given her current BMI. Her portion sizes, particularly of lean protein, have been insufficient, although they have improved compared to before.  She participates in an exercise class tailored for Parkinson's disease, engaging in various exercises for 60 minutes, three times a week. Despite this, she has experienced significant muscle mass loss over the past year, particularly during the fall, winter, and spring seasons, increasing her risk of falls.  She has a history of type 2 diabetes, currently managed with Mounjaro  15 mg weekly and Farxiga  5 mg daily. Her most recent hemoglobin A1c, measured in December 2024, was 6.0. She has encountered issues with obtaining her Mounjaro  prescription due to refill problems and a misunderstanding regarding an allergy to Trulicity, which was actually a side effect of nausea and vomiting.  She experienced a fall recently  while ascending stairs with her hands full and without her cane, resulting in back pain. She was evaluated at urgent care, where a compression fracture was suspected but not confirmed. Her back is currently the most painful area.  Her Parkinson's disease symptoms have reportedly worsened since last summer, coinciding with the period of significant muscle mass loss. She attends a Parkinson's exercise class that includes power lifting and other strenuous workouts, but she is only averaging two days a week at the gym.      PHYSICAL EXAM:  Blood pressure 105/60, pulse 64, temperature (!) 97.4 F (36.3 C), height 5\' 6"  (1.676 m), weight 178 lb (80.7 kg), SpO2 98%. Body mass index is 28.73 kg/m.  DIAGNOSTIC DATA REVIEWED:  BMET    Component Value Date/Time   NA 128 (L) 08/27/2022 2108   NA 138 04/05/2022 0942   NA 142 01/12/2017 0914   K 3.9 08/27/2022 2108   K 4.2 01/12/2017 0914   CL 96 (L) 08/27/2022 2108   CO2 24 08/27/2022 2108   CO2 28 01/12/2017 0914   GLUCOSE 84 08/27/2022 2108   GLUCOSE 142 (H) 01/12/2017 0914   BUN 16 08/27/2022 2108   BUN 19 04/05/2022 0942   BUN 12.0 01/12/2017 0914   CREATININE 1.26 (H) 08/27/2022 2108   CREATININE 1.58 (H) 10/12/2018 1121   CREATININE 1.3 (H) 01/12/2017 0914   CALCIUM  8.7 (L) 08/27/2022 2108   CALCIUM  9.6 01/12/2017 0914   GFRNONAA 47 (L) 08/27/2022 2108   GFRNONAA 34 (L) 10/12/2018 1121   GFRAA 44 (L) 03/02/2020 0920   GFRAA 40 (L) 10/12/2018 1121   Lab  Results  Component Value Date   HGBA1C 7.9 (H) 04/16/2019   HGBA1C 7.3 09/04/2014   Lab Results  Component Value Date   INSULIN  12.3 04/16/2019   Lab Results  Component Value Date   TSH 1.200 04/05/2022   CBC    Component Value Date/Time   WBC 5.4 05/15/2023 1149   WBC 6.9 08/27/2022 2108   RBC 3.69 (L) 05/15/2023 1149   RBC 3.54 (L) 08/27/2022 2108   HGB 12.3 05/15/2023 1149   HGB 11.4 (L) 01/12/2017 0913   HCT 36.4 05/15/2023 1149   HCT 35.2 01/12/2017 0913    PLT 242 05/15/2023 1149   MCV 99 (H) 05/15/2023 1149   MCV 100.9 01/12/2017 0913   MCH 33.3 (H) 05/15/2023 1149   MCH 33.6 08/27/2022 2108   MCHC 33.8 05/15/2023 1149   MCHC 34.1 08/27/2022 2108   RDW 12.8 05/15/2023 1149   RDW 14.1 01/12/2017 0913   Iron Studies No results found for: "IRON", "TIBC", "FERRITIN", "IRONPCTSAT" Lipid Panel     Component Value Date/Time   CHOL 155 04/16/2019 1451   TRIG 71 04/16/2019 1451   HDL 84 04/16/2019 1451   LDLCALC 57 04/16/2019 1451   Hepatic Function Panel     Component Value Date/Time   PROT 7.2 08/27/2022 2108   PROT 7.1 04/05/2022 0942   PROT 7.2 01/12/2017 0914   ALBUMIN 3.5 08/27/2022 2108   ALBUMIN 4.3 04/05/2022 0942   ALBUMIN 3.5 01/12/2017 0914   AST 18 08/27/2022 2108   AST 16 10/12/2018 1121   AST 19 01/12/2017 0914   ALT 5 08/27/2022 2108   ALT <6 10/12/2018 1121   ALT 18 01/12/2017 0914   ALKPHOS 47 08/27/2022 2108   ALKPHOS 43 01/12/2017 0914   BILITOT 0.4 08/27/2022 2108   BILITOT 0.3 04/05/2022 0942   BILITOT 0.2 (L) 10/12/2018 1121   BILITOT 0.28 01/12/2017 0914      Component Value Date/Time   TSH 1.200 04/05/2022 0942   Nutritional Lab Results  Component Value Date   VD25OH 44.8 12/17/2019   VD25OH 40.9 04/16/2019     Assessment and Plan Assessment & Plan Obesity Obesity with recent excessive weight loss of 8 pounds in the last month, concerning due to significant muscle mass loss, indicating sarcopenia. The weight loss may be attributed to the current Mounjaro  dose, potentially suppressing appetite excessively and affecting nutritional intake. - Ensure adherence to the category two eating plan with adequate protein intake. - Increase strengthening exercises to four times a week. - Consult with [facility] exercise specialist to review Parkinson's exercises and appropriate machines for muscle strengthening.  Sarcopenia Sarcopenia with significant muscle mass loss over the past year, exacerbated by  recent weight loss. She has lost more than 15 pounds of muscle mass since starting the weight management program, increasing fall risk and impacting balance, especially with Parkinson's disease. Increasing core strength is crucial to improve balance and prevent falls. - Increase strengthening exercises to four times a week to prevent further muscle loss and improve muscle mass. - Ensure adequate protein intake to support muscle building. - Consult with [facility] exercise specialist to review appropriate exercises and machines for muscle strengthening.  Parkinson's disease Parkinson's disease with recent worsening noted last summer. Participation in a Parkinson's exercise class includes power lifting and strenuous workouts, but exercise frequency is insufficient, contributing to muscle loss and increased fall risk. Core strengthening is essential to improve balance and prevent falls. - Increase exercise frequency to a minimum of four  times a week, focusing on core strengthening to improve balance and prevent falls. - Consult with [facility] exercise specialist to ensure exercises are appropriate for Parkinson's and to maximize benefits.  Type 2 diabetes mellitus Type 2 diabetes mellitus is well-controlled with a recent hemoglobin A1c of 6.0. She is on Mounjaro  15 mg weekly and Farxiga  5 mg daily. Due to significant muscle loss and potential over-suppression of appetite, the current Mounjaro  dose may be too high, affecting nutritional intake. - Decrease Mounjaro  dose from 15 mg to 12.5 mg weekly. - Continue Farxiga  5 mg daily. - Monitor nutritional intake to ensure adequacy.    She was informed of the importance of frequent follow up visits to maximize her success with intensive lifestyle modifications for her multiple health conditions.    Jasmine Mesi, MD

## 2023-07-26 NOTE — Progress Notes (Signed)
 Specialty Pharmacy Refill Coordination Note  Deanna Rowe is a 68 y.o. female contacted today regarding refills of specialty medication(s) Xolair .  Patient requested (Patient-Rptd) Pickup at [facility] at [facility] date: (Patient-Rptd) 07/31/23   Medication will be filled on 07/28/23.

## 2023-07-27 ENCOUNTER — Ambulatory Visit: Payer: Commercial Managed Care - PPO | Admitting: Physician Assistant

## 2023-07-27 ENCOUNTER — Other Ambulatory Visit (HOSPITAL_COMMUNITY): Payer: Self-pay

## 2023-07-28 ENCOUNTER — Other Ambulatory Visit: Payer: Self-pay

## 2023-07-31 ENCOUNTER — Other Ambulatory Visit (HOSPITAL_BASED_OUTPATIENT_CLINIC_OR_DEPARTMENT_OTHER): Payer: Self-pay

## 2023-07-31 ENCOUNTER — Other Ambulatory Visit: Payer: Self-pay

## 2023-08-02 NOTE — Progress Notes (Signed)
 Cardiology Office Note    Patient Name: Deanna Rowe Date of Encounter: 08/02/2023  Primary Care Provider:  Annamarie Kid, MD Primary Cardiologist:  Avery Bodo, MD Primary Electrophysiologist: None   Past Medical History    Past Medical History:  Diagnosis Date   Anemia    Anxiety    Arthritis    L HIP   Asthma    Avascular necrosis of hip (HCC)    LEFT   Back pain    Chest pain    Constipation    Depression    Diabetes mellitus    Diabetes mellitus, type II (HCC)    Dry cough    Edema, lower extremity    Gait abnormality 11/18/2019   GERD (gastroesophageal reflux disease)    High cholesterol    Hypertension    Insomnia    takes Ambien  nightly   Joint pain    Kidney disease    Kidney disease    Obesity    Parkinsonism (HCC) 03/02/2017   PONV (postoperative nausea and vomiting)    RLS (restless legs syndrome) 01/15/2018   Sleep apnea    Swallowing difficulty    Thyroid  disease    TIA (transient ischemic attack) 2012   no residual problems   Urgency incontinence    Vitamin D  deficiency     History of Present Illness  Deanna Rowe is a 68 y.o. female with a PMH of symptomatic PVCs and PACs, Parkinson's disease, OSA, HLD, HTN, DM type II who presents today for 30-month follow-up.  Deanna Rowe was seen initially in 2014 by Dr. Jacquelynn Matter for complaint of intermittent dizziness and palpitations.  She underwent a 2D echo and event monitor with EF of 55 to 60% and no RWMA with asynchronous wall motion and grade 1 DD.  She was seen on 04/16/2015 with complaint of palpitations and tachycardia with exertion.  She underwent a repeat heart monitor that showed no evidence of arrhythmia with occasional PVCs and PACs.  She completed an ETT that was negative for ischemia.  She was started on beta-blocker for symptomatic palpitations with improvement.  She completed a coronary calcium  score on 03/2020 for complaint of chest pain that showed normal coronaries and  calcium  score 0.  She was last seen by Dr. Jacquelynn Matter on 05/18/2022 and noted occasional leg edema with controlled BP.  She was last seen on 05/15/2023 by Lovette Rud, PA and was noted to have low BP.  She also reported lightheadedness and headaches and was currently on losartan  and propranolol  after discontinuing amlodipine .  Tessa reduced her losartan  to 50 mg 7-day ZIO was ordered to evaluate PACs and PVCs.  Event monitor results show predominantly sinus rhythm with 29 PVCs and multiple PACs.  TTE was also completed with EF of 60 to 65% and no RWMA with grade 1 DD with no significant changes from prior echo.  Deanna Rowe presents today with her husband for 22-month follow-up.In February 2025, she experienced low blood pressure, lightheadedness, and headaches. She was on losartan  and propranolol , and amlodipine  was discontinued. Losartan  was reduced from 100 mg to 50 mg.  She reports improvement to symptoms with no lightheadedness or headaches noted. An event monitor showed 29 extra beats in the heart's lower part and multiple extra beats in the upper part, but it was not uncontrolled. A recent echocardiogram showed normal heart function with no changes from prior visits. She takes propranolol  40 mg, losartan  50 mg, Farxiga , and Mounjaro . Her blood sugars are well-controlled.  She had a recent fall resulting in bruising but did not hit her head. She was seen at urgent care for this incident. She has a history of high cholesterol and is on Lipitor. A recent abdominal CT showed plaque in the aorta but not in the heart. Her cholesterol levels are well-controlled with a total cholesterol of 144, LDL of 58, HDL of 73, and triglycerides of 65. No current headaches or dizziness. Occasional dizziness attributed to propranolol . No new complaints or concerns since the last visit. Reports small amount of swelling in feet that resolves overnight. Patient denies chest pain, palpitations, dyspnea, PND, orthopnea, nausea, vomiting,  dizziness, syncope, edema, weight gain, or early satiety.  Discussed the use of AI scribe software for clinical note transcription with the patient, who gave verbal consent to proceed.  History of Present Illness    Review of Systems  Please see the history of present illness.    All other systems reviewed and are otherwise negative except as noted above.  Physical Exam    Wt Readings from Last 3 Encounters:  07/26/23 178 lb (80.7 kg)  06/28/23 186 lb (84.4 kg)  06/26/23 192 lb 11.2 oz (87.4 kg)   [id number] were no vitals filed for this visit.,There is no height or weight on file to calculate BMI. GEN: Well nourished, well developed in no acute distress Neck: No JVD; No carotid bruits Pulmonary: Clear to auscultation without rales, wheezing or rhonchi  Cardiovascular: Normal rate. Regular rhythm. Normal S1. Normal S2.   Murmurs: There is no murmur.  ABDOMEN: Soft, non-tender, non-distended EXTREMITIES: Trace bilateral lower extremity edema  EKG/LABS/ Recent Cardiac Studies   ECG personally reviewed by me today -none completed today  Risk Assessment/Calculations:          Lab Results  Component Value Date   WBC 5.4 05/15/2023   HGB 12.3 05/15/2023   HCT 36.4 05/15/2023   MCV 99 (H) 05/15/2023   PLT 242 05/15/2023   Lab Results  Component Value Date   CREATININE 1.26 (H) 08/27/2022   BUN 16 08/27/2022   NA 128 (L) 08/27/2022   K 3.9 08/27/2022   CL 96 (L) 08/27/2022   CO2 24 08/27/2022   Lab Results  Component Value Date   CHOL 155 04/16/2019   HDL 84 04/16/2019   LDLCALC 57 04/16/2019   TRIG 71 04/16/2019    Lab Results  Component Value Date   HGBA1C 7.9 (H) 04/16/2019   Assessment & Plan    1.  Hypotension and dizziness: - Currently resolved with stable blood pressure of 116/68 - Continue losartan  50 mg daily and propranolol  40 mg twice daily -Intermittent dizziness possibly related to propranolol , symptoms manageable. - Monitor for increased  dizziness, consider propranolol  adjustment if symptoms worsen.  2.  History of PACs and PVCs: -Palpitations and tachycardia managed with propranolol . Event monitor showed occasional ectopic beats, symptom control achieved. - Continue propranolol  40 mg. - Monitor for increased dizziness, consider propranolol  adjustment if symptoms worsen.  3.  Hyperlipidemia: - Patient's LDL cholesterol well-controlled at 58 - Continue Lipitor 10 mg daily  4.  History of Parkinson's: - Currently controlled and managed by neurology -Recent fall with bruising, no head injury. Advised on future head injury precautions. - Monitor for new symptoms post-fall. - Seek medical attention if head injury occurs in future falls.  Disposition: Follow-up with Dr.Sullivan or APP in 6 months    Signed, Francene Ing, Retha Cast, NP 08/02/2023, 5:20 PM [facility]

## 2023-08-03 ENCOUNTER — Ambulatory Visit: Attending: Nurse Practitioner | Admitting: Nurse Practitioner

## 2023-08-03 ENCOUNTER — Encounter: Payer: Self-pay | Admitting: Nurse Practitioner

## 2023-08-03 ENCOUNTER — Other Ambulatory Visit (HOSPITAL_BASED_OUTPATIENT_CLINIC_OR_DEPARTMENT_OTHER): Payer: Self-pay

## 2023-08-03 VITALS — BP 116/68 | HR 81 | Ht 66.0 in | Wt 192.8 lb

## 2023-08-03 DIAGNOSIS — I10 Essential (primary) hypertension: Secondary | ICD-10-CM

## 2023-08-03 DIAGNOSIS — E782 Mixed hyperlipidemia: Secondary | ICD-10-CM | POA: Diagnosis not present

## 2023-08-03 DIAGNOSIS — G20A1 Parkinson's disease without dyskinesia, without mention of fluctuations: Secondary | ICD-10-CM | POA: Diagnosis not present

## 2023-08-03 DIAGNOSIS — I493 Ventricular premature depolarization: Secondary | ICD-10-CM | POA: Diagnosis not present

## 2023-08-03 MED ORDER — PROPRANOLOL HCL 40 MG PO TABS
40.0000 mg | ORAL_TABLET | Freq: Two times a day (BID) | ORAL | 3 refills | Status: AC
  Filled 2023-08-03 – 2023-08-28 (×2): qty 180, 90d supply, fill #0
  Filled 2023-12-08: qty 180, 90d supply, fill #1
  Filled 2024-03-12 (×2): qty 180, 90d supply, fill #2

## 2023-08-03 MED ORDER — ATORVASTATIN CALCIUM 10 MG PO TABS
10.0000 mg | ORAL_TABLET | Freq: Every evening | ORAL | 3 refills | Status: AC
  Filled 2023-08-03: qty 90, 90d supply, fill #0
  Filled 2023-11-06: qty 90, 90d supply, fill #1
  Filled 2024-02-05: qty 90, 90d supply, fill #2
  Filled 2024-04-30: qty 90, 90d supply, fill #3

## 2023-08-03 MED ORDER — LOSARTAN POTASSIUM 50 MG PO TABS
50.0000 mg | ORAL_TABLET | Freq: Every day | ORAL | 3 refills | Status: AC
  Filled 2023-08-03: qty 90, 90d supply, fill #0
  Filled 2023-11-06: qty 90, 90d supply, fill #1
  Filled 2024-02-05: qty 90, 90d supply, fill #2
  Filled 2024-04-30: qty 90, 90d supply, fill #3

## 2023-08-03 NOTE — Patient Instructions (Signed)
 Medication Instructions:  Your physician recommends that you continue on your current medications as directed. Please refer to the Current Medication list given to you today. *If you need a refill on your cardiac medications before your next appointment, please call your pharmacy*  Lab Work: None Ordered If you have labs (blood work) drawn today and your tests are completely normal, you will receive your results only by: MyChart Message (if you have MyChart) OR A paper copy in the mail If you have any lab test that is abnormal or we need to change your treatment, we will call you to review the results.  Testing/Procedures: None Ordered  Follow-Up: At [facility], you and your health needs are our priority.  As part of our continuing mission to provide you with exceptional heart care, our providers are all part of one team.  This team includes your primary Cardiologist (physician) and Advanced Practice Providers or APPs (Physician Assistants and Nurse Practitioners) who all work together to provide you with the care you need, when you need it.  Your next appointment:   6 month(s)  Provider:   Christena Covert, MD   We recommend signing up for the patient portal called "MyChart".  Sign up information is provided on this After Visit Summary.  MyChart is used to connect with patients for Virtual Visits (Telemedicine).  Patients are able to view lab/test results, encounter notes, upcoming appointments, etc.  Non-urgent messages can be sent to your provider as well.   To learn more about what you can do with MyChart, go to [email or web address].   Other Instructions

## 2023-08-07 ENCOUNTER — Other Ambulatory Visit (HOSPITAL_BASED_OUTPATIENT_CLINIC_OR_DEPARTMENT_OTHER): Payer: Self-pay

## 2023-08-08 ENCOUNTER — Other Ambulatory Visit (HOSPITAL_BASED_OUTPATIENT_CLINIC_OR_DEPARTMENT_OTHER): Payer: Self-pay

## 2023-08-08 MED ORDER — GABAPENTIN 300 MG PO CAPS
300.0000 mg | ORAL_CAPSULE | Freq: Three times a day (TID) | ORAL | 1 refills | Status: DC
  Filled 2023-08-14: qty 270, 90d supply, fill #0
  Filled 2023-11-13: qty 270, 90d supply, fill #1

## 2023-08-09 ENCOUNTER — Other Ambulatory Visit (HOSPITAL_BASED_OUTPATIENT_CLINIC_OR_DEPARTMENT_OTHER): Payer: Self-pay

## 2023-08-10 ENCOUNTER — Other Ambulatory Visit (HOSPITAL_BASED_OUTPATIENT_CLINIC_OR_DEPARTMENT_OTHER): Payer: Self-pay

## 2023-08-10 DIAGNOSIS — Z794 Long term (current) use of insulin: Secondary | ICD-10-CM | POA: Diagnosis not present

## 2023-08-10 DIAGNOSIS — E1165 Type 2 diabetes mellitus with hyperglycemia: Secondary | ICD-10-CM | POA: Diagnosis not present

## 2023-08-10 MED ORDER — ACCU-CHEK FASTCLIX LANCETS MISC
4 refills | Status: AC
  Filled 2023-08-10: qty 300, 90d supply, fill #0
  Filled 2023-08-11: qty 306, 90d supply, fill #0

## 2023-08-10 MED ORDER — FREESTYLE LIBRE 3 PLUS SENSOR MISC
4 refills | Status: DC
  Filled 2023-08-10: qty 3, 28d supply, fill #0
  Filled 2023-08-14: qty 6, 90d supply, fill #0

## 2023-08-10 MED ORDER — FREESTYLE LITE W/DEVICE KIT
PACK | 0 refills | Status: AC
  Filled 2023-08-10: qty 1, 30d supply, fill #0

## 2023-08-10 MED ORDER — FREESTYLE LITE TEST VI STRP
ORAL_STRIP | 4 refills | Status: DC
  Filled 2023-08-10: qty 300, 90d supply, fill #0

## 2023-08-10 MED ORDER — FREESTYLE LIBRE 3 READER DEVI
0 refills | Status: DC
  Filled 2023-08-10: qty 1, 90d supply, fill #0
  Filled 2023-08-14: qty 1, 30d supply, fill #0

## 2023-08-11 ENCOUNTER — Other Ambulatory Visit (HOSPITAL_BASED_OUTPATIENT_CLINIC_OR_DEPARTMENT_OTHER): Payer: Self-pay

## 2023-08-14 ENCOUNTER — Other Ambulatory Visit (HOSPITAL_BASED_OUTPATIENT_CLINIC_OR_DEPARTMENT_OTHER): Payer: Self-pay

## 2023-08-14 ENCOUNTER — Other Ambulatory Visit: Payer: Self-pay

## 2023-08-15 ENCOUNTER — Other Ambulatory Visit: Payer: Self-pay

## 2023-08-17 ENCOUNTER — Other Ambulatory Visit (HOSPITAL_COMMUNITY): Payer: Self-pay

## 2023-08-21 ENCOUNTER — Encounter (INDEPENDENT_AMBULATORY_CARE_PROVIDER_SITE_OTHER): Payer: Self-pay | Admitting: Family Medicine

## 2023-08-21 ENCOUNTER — Ambulatory Visit (INDEPENDENT_AMBULATORY_CARE_PROVIDER_SITE_OTHER): Admitting: Family Medicine

## 2023-08-21 ENCOUNTER — Other Ambulatory Visit (HOSPITAL_BASED_OUTPATIENT_CLINIC_OR_DEPARTMENT_OTHER): Payer: Self-pay

## 2023-08-21 VITALS — BP 105/63 | HR 69 | Temp 97.5°F | Ht 66.0 in | Wt 187.0 lb

## 2023-08-21 DIAGNOSIS — Z6830 Body mass index (BMI) 30.0-30.9, adult: Secondary | ICD-10-CM

## 2023-08-21 DIAGNOSIS — Z7985 Long-term (current) use of injectable non-insulin antidiabetic drugs: Secondary | ICD-10-CM | POA: Diagnosis not present

## 2023-08-21 DIAGNOSIS — E119 Type 2 diabetes mellitus without complications: Secondary | ICD-10-CM | POA: Diagnosis not present

## 2023-08-21 DIAGNOSIS — E1169 Type 2 diabetes mellitus with other specified complication: Secondary | ICD-10-CM

## 2023-08-21 DIAGNOSIS — E669 Obesity, unspecified: Secondary | ICD-10-CM

## 2023-08-21 DIAGNOSIS — G20A1 Parkinson's disease without dyskinesia, without mention of fluctuations: Secondary | ICD-10-CM | POA: Diagnosis not present

## 2023-08-21 DIAGNOSIS — E66812 Obesity, class 2: Secondary | ICD-10-CM

## 2023-08-21 MED ORDER — TIRZEPATIDE 12.5 MG/0.5ML ~~LOC~~ SOAJ
12.5000 mg | SUBCUTANEOUS | 0 refills | Status: DC
  Filled 2023-08-21: qty 6, 84d supply, fill #0

## 2023-08-21 NOTE — Progress Notes (Signed)
 Office: [phone]  /  Fax: [phone]  WEIGHT SUMMARY AND BIOMETRICS  Anthropometric Measurements Height: 5\' 6"  (1.676 m) Weight: 187 lb (84.8 kg) BMI (Calculated): 30.2 Weight at Last Visit: 178 lb Weight Lost Since Last Visit: 0 Weight Gained Since Last Visit: 9 lb Starting Weight: 237 lb Total Weight Loss (lbs): 50 lb (22.7 kg) Peak Weight: 237 lb   Body Composition  Body Fat %: 40.6 % Fat Mass (lbs): 76.2 lbs Muscle Mass (lbs): 105.8 lbs Total Body Water (lbs): 75.2 lbs Visceral Fat Rating : 11   Other Clinical Data Fasting: no Labs: no Today's Visit #: 54 Starting Date: 04/16/19    Chief Complaint: OBESITY     History of Present Illness Deanna Rowe is a 68 year old female with obesity, type 2 diabetes, and Parkinson's syndrome who presents for obesity treatment and assessment of her diabetes and Parkinson's syndrome.  She is actively working on weight loss and has lost eight pounds over the past month, although she experienced a slight weight gain during a recent vacation. She maintains a regular exercise routine, including classes specifically designed for Parkinson's and additional strengthening exercises.  Her type 2 diabetes management includes Mounjaro , which was reduced from 15 mg to 12.5 mg a month ago due to appetite suppression leading to skipped meals. She did not notice a significant difference with the dose change. She missed one dose of Mounjaro  during her vacation.  For Parkinson's syndrome, she participates in exercise classes focused on strengthening and balance. She reports no recent falls and feels good overall, with an emphasis on improving her core strength.  She maintains a high level of hydration and focuses on meal planning to ensure adequate protein intake, exploring new recipes to support her dietary needs.      PHYSICAL EXAM:  Blood pressure 105/63, pulse 69, temperature (!) 97.5 F (36.4 C), height 5\' 6"  (1.676 m),  weight 187 lb (84.8 kg), SpO2 98%. Body mass index is 30.18 kg/m.  DIAGNOSTIC DATA REVIEWED:  BMET    Component Value Date/Time   NA 128 (L) 08/27/2022 2108   NA 138 04/05/2022 0942   NA 142 01/12/2017 0914   K 3.9 08/27/2022 2108   K 4.2 01/12/2017 0914   CL 96 (L) 08/27/2022 2108   CO2 24 08/27/2022 2108   CO2 28 01/12/2017 0914   GLUCOSE 84 08/27/2022 2108   GLUCOSE 142 (H) 01/12/2017 0914   BUN 16 08/27/2022 2108   BUN 19 04/05/2022 0942   BUN 12.0 01/12/2017 0914   CREATININE 1.26 (H) 08/27/2022 2108   CREATININE 1.58 (H) 10/12/2018 1121   CREATININE 1.3 (H) 01/12/2017 0914   CALCIUM  8.7 (L) 08/27/2022 2108   CALCIUM  9.6 01/12/2017 0914   GFRNONAA 47 (L) 08/27/2022 2108   GFRNONAA 34 (L) 10/12/2018 1121   GFRAA 44 (L) 03/02/2020 0920   GFRAA 40 (L) 10/12/2018 1121   Lab Results  Component Value Date   HGBA1C 7.9 (H) 04/16/2019   HGBA1C 7.3 09/04/2014   Lab Results  Component Value Date   INSULIN  12.3 04/16/2019   Lab Results  Component Value Date   TSH 1.200 04/05/2022   CBC    Component Value Date/Time   WBC 5.4 05/15/2023 1149   WBC 6.9 08/27/2022 2108   RBC 3.69 (L) 05/15/2023 1149   RBC 3.54 (L) 08/27/2022 2108   HGB 12.3 05/15/2023 1149   HGB 11.4 (L) 01/12/2017 0913   HCT 36.4 05/15/2023 1149   HCT 35.2  01/12/2017 0913   PLT 242 05/15/2023 1149   MCV 99 (H) 05/15/2023 1149   MCV 100.9 01/12/2017 0913   MCH 33.3 (H) 05/15/2023 1149   MCH 33.6 08/27/2022 2108   MCHC 33.8 05/15/2023 1149   MCHC 34.1 08/27/2022 2108   RDW 12.8 05/15/2023 1149   RDW 14.1 01/12/2017 0913   Iron Studies No results found for: "IRON", "TIBC", "FERRITIN", "IRONPCTSAT" Lipid Panel     Component Value Date/Time   CHOL 155 04/16/2019 1451   TRIG 71 04/16/2019 1451   HDL 84 04/16/2019 1451   LDLCALC 57 04/16/2019 1451   Hepatic Function Panel     Component Value Date/Time   PROT 7.2 08/27/2022 2108   PROT 7.1 04/05/2022 0942   PROT 7.2 01/12/2017 0914    ALBUMIN 3.5 08/27/2022 2108   ALBUMIN 4.3 04/05/2022 0942   ALBUMIN 3.5 01/12/2017 0914   AST 18 08/27/2022 2108   AST 16 10/12/2018 1121   AST 19 01/12/2017 0914   ALT 5 08/27/2022 2108   ALT <6 10/12/2018 1121   ALT 18 01/12/2017 0914   ALKPHOS 47 08/27/2022 2108   ALKPHOS 43 01/12/2017 0914   BILITOT 0.4 08/27/2022 2108   BILITOT 0.3 04/05/2022 0942   BILITOT 0.2 (L) 10/12/2018 1121   BILITOT 0.28 01/12/2017 0914      Component Value Date/Time   TSH 1.200 04/05/2022 0942   Nutritional Lab Results  Component Value Date   VD25OH 44.8 12/17/2019   VD25OH 40.9 04/16/2019     Assessment and Plan Assessment & Plan Type 2 Diabetes Mellitus Type 2 diabetes is managed with Mounjaro , reduced from 15 mg to 12.5 mg due to appetite suppression and meal skipping. A missed dose during vacation had minimal impact due to the medication's long-acting nature. Emphasis on diet, exercise, and weight loss remains crucial. - Refill Mounjaro  at 12.5 mg. - Continue diet, exercise, and weight loss efforts.  Obesity Obesity management is ongoing. Recent weight gain is attributed to vacation, with some weight being water retention. Muscle mass has improved, reversing previous loss. Emphasis on exercise, nutrition, and hydration to manage fluid retention and prevent dehydration. - Continue category two eating plan. - Increase exercise to four times a week. - Ensure adequate hydration.  Parkinson's Disease Parkinson's disease management includes strengthening exercises. No recent falls reported. Exercise regimen includes classes and balance training to improve core strength and reduce fall risk. Increasing exercise frequency is a goal. - Continue strengthening exercises with a goal of four times a week. - Follow up with neurology as instructed.      She was informed of the importance of frequent follow up visits to maximize her success with intensive lifestyle modifications for her multiple  health conditions.    Jasmine Mesi, MD

## 2023-08-22 ENCOUNTER — Other Ambulatory Visit: Payer: Self-pay

## 2023-08-22 NOTE — Progress Notes (Signed)
 Specialty Pharmacy Ongoing Clinical Assessment Note  [name] is a 68 y.o. female who is being followed by the specialty pharmacy service for RxSp Asthma/COPD   Patient's specialty medication(s) reviewed today: Omalizumab  (Xolair )   Missed doses in the last 4 weeks: 0   Patient/Caregiver did not have any additional questions or concerns.   Therapeutic benefit summary: Patient is achieving benefit   Adverse events/side effects summary: No adverse events/side effects   Patient's therapy is appropriate to: Continue    Goals Addressed             This Visit's Progress    Reduce disease symptoms including coughing and shortness of breath   On track    Patient is on track. Patient will maintain adherence. As of office visit on 06/26/23, patient is only having to use Symbicort  inhaler and has not had any recent sick visits for respiratory issues.         Follow up: 6 months  [facility]

## 2023-08-22 NOTE — Progress Notes (Signed)
 Specialty Pharmacy Refill Coordination Note  Deanna Rowe is a 68 y.o. female contacted today regarding refills of specialty medication(s) Omalizumab  (Xolair )   Patient requested Pickup at [facility] at [place] date: 08/29/23   Medication will be filled on 08/25/23.

## 2023-08-23 ENCOUNTER — Other Ambulatory Visit (HOSPITAL_BASED_OUTPATIENT_CLINIC_OR_DEPARTMENT_OTHER): Payer: Self-pay

## 2023-08-24 DIAGNOSIS — E1151 Type 2 diabetes mellitus with diabetic peripheral angiopathy without gangrene: Secondary | ICD-10-CM | POA: Diagnosis not present

## 2023-08-24 DIAGNOSIS — E1122 Type 2 diabetes mellitus with diabetic chronic kidney disease: Secondary | ICD-10-CM | POA: Diagnosis not present

## 2023-08-24 DIAGNOSIS — N1832 Chronic kidney disease, stage 3b: Secondary | ICD-10-CM | POA: Diagnosis not present

## 2023-08-24 DIAGNOSIS — D649 Anemia, unspecified: Secondary | ICD-10-CM | POA: Diagnosis not present

## 2023-08-24 DIAGNOSIS — I10 Essential (primary) hypertension: Secondary | ICD-10-CM | POA: Diagnosis not present

## 2023-08-24 DIAGNOSIS — E875 Hyperkalemia: Secondary | ICD-10-CM | POA: Diagnosis not present

## 2023-08-24 LAB — BASIC METABOLIC PANEL WITH GFR
BUN: 24 — AB (ref 4–21)
CO2: 29 — AB (ref 13–22)
Chloride: 99 (ref 99–108)
Creatinine: 1.4 — AB (ref 0.5–1.1)
Glucose: 80
Potassium: 6.4 meq/L — AB (ref 3.5–5.1)
Sodium: 133 — AB (ref 137–147)

## 2023-08-24 LAB — VITAMIN B12
Vitamin B-12: 112
Vitamin B-12: 112

## 2023-08-24 LAB — HEPATIC FUNCTION PANEL
ALT: 8 U/L (ref 7–35)
AST: 15 (ref 13–35)
Alkaline Phosphatase: 45 (ref 25–125)
Bilirubin, Total: 0.4

## 2023-08-24 LAB — COMPREHENSIVE METABOLIC PANEL WITH GFR
Albumin: 4.1 (ref 3.5–5.0)
Calcium: 9.7 (ref 8.7–10.7)
eGFR: 42

## 2023-08-24 LAB — HEMOGLOBIN A1C: Hemoglobin A1C: 6.1

## 2023-08-25 ENCOUNTER — Other Ambulatory Visit (HOSPITAL_BASED_OUTPATIENT_CLINIC_OR_DEPARTMENT_OTHER): Payer: Self-pay

## 2023-08-25 ENCOUNTER — Other Ambulatory Visit: Payer: Self-pay

## 2023-08-28 ENCOUNTER — Other Ambulatory Visit (HOSPITAL_BASED_OUTPATIENT_CLINIC_OR_DEPARTMENT_OTHER): Payer: Self-pay

## 2023-08-29 ENCOUNTER — Other Ambulatory Visit (HOSPITAL_BASED_OUTPATIENT_CLINIC_OR_DEPARTMENT_OTHER): Payer: Self-pay

## 2023-08-29 DIAGNOSIS — E875 Hyperkalemia: Secondary | ICD-10-CM | POA: Diagnosis not present

## 2023-08-29 LAB — HEPATIC FUNCTION PANEL
ALT: 9 U/L (ref 7–35)
AST: 14 (ref 13–35)
Alkaline Phosphatase: 50 (ref 25–125)
Bilirubin, Total: 0.4

## 2023-08-29 LAB — BASIC METABOLIC PANEL WITH GFR
BUN: 21 (ref 4–21)
CO2: 27 — AB (ref 13–22)
Chloride: 99 (ref 99–108)
Creatinine: 1.5 — AB (ref 0.5–1.1)
Glucose: 120
Potassium: 5.6 meq/L — AB (ref 3.5–5.1)
Sodium: 133 — AB (ref 137–147)

## 2023-08-29 LAB — HEMOGLOBIN A1C: Hemoglobin A1C: 6.1

## 2023-08-30 LAB — COMPREHENSIVE METABOLIC PANEL WITH GFR
Albumin: 4.2 (ref 3.5–5.0)
Calcium: 9.7 (ref 8.7–10.7)
eGFR: 39

## 2023-09-04 ENCOUNTER — Telehealth: Payer: Self-pay | Admitting: Neurology

## 2023-09-04 ENCOUNTER — Ambulatory Visit: Payer: Commercial Managed Care - PPO | Admitting: Neurology

## 2023-09-04 ENCOUNTER — Other Ambulatory Visit: Payer: Self-pay | Admitting: Neurology

## 2023-09-04 ENCOUNTER — Other Ambulatory Visit (HOSPITAL_BASED_OUTPATIENT_CLINIC_OR_DEPARTMENT_OTHER): Payer: Self-pay

## 2023-09-04 MED ORDER — CARBIDOPA-LEVODOPA ER 50-200 MG PO TBCR
1.0000 | EXTENDED_RELEASE_TABLET | Freq: Every day | ORAL | 0 refills | Status: DC
Start: 2023-09-04 — End: 2023-10-27
  Filled 2023-09-14: qty 450, 90d supply, fill #0

## 2023-09-04 NOTE — Telephone Encounter (Signed)
 Pt has called and r/s appointment with wait list

## 2023-09-07 DIAGNOSIS — K1370 Unspecified lesions of oral mucosa: Secondary | ICD-10-CM | POA: Diagnosis not present

## 2023-09-11 ENCOUNTER — Ambulatory Visit: Payer: Commercial Managed Care - PPO | Admitting: Neurology

## 2023-09-11 DIAGNOSIS — N3941 Urge incontinence: Secondary | ICD-10-CM | POA: Diagnosis not present

## 2023-09-11 DIAGNOSIS — E042 Nontoxic multinodular goiter: Secondary | ICD-10-CM | POA: Diagnosis not present

## 2023-09-11 DIAGNOSIS — N1831 Chronic kidney disease, stage 3a: Secondary | ICD-10-CM | POA: Diagnosis not present

## 2023-09-11 DIAGNOSIS — I700 Atherosclerosis of aorta: Secondary | ICD-10-CM | POA: Diagnosis not present

## 2023-09-11 DIAGNOSIS — E538 Deficiency of other specified B group vitamins: Secondary | ICD-10-CM | POA: Diagnosis not present

## 2023-09-11 DIAGNOSIS — E1151 Type 2 diabetes mellitus with diabetic peripheral angiopathy without gangrene: Secondary | ICD-10-CM | POA: Diagnosis not present

## 2023-09-11 DIAGNOSIS — E875 Hyperkalemia: Secondary | ICD-10-CM | POA: Diagnosis not present

## 2023-09-11 DIAGNOSIS — E113299 Type 2 diabetes mellitus with mild nonproliferative diabetic retinopathy without macular edema, unspecified eye: Secondary | ICD-10-CM | POA: Diagnosis not present

## 2023-09-11 DIAGNOSIS — E871 Hypo-osmolality and hyponatremia: Secondary | ICD-10-CM | POA: Diagnosis not present

## 2023-09-14 ENCOUNTER — Other Ambulatory Visit: Payer: Self-pay

## 2023-09-14 ENCOUNTER — Other Ambulatory Visit (HOSPITAL_BASED_OUTPATIENT_CLINIC_OR_DEPARTMENT_OTHER): Payer: Self-pay

## 2023-09-15 ENCOUNTER — Other Ambulatory Visit: Payer: Self-pay

## 2023-09-15 ENCOUNTER — Other Ambulatory Visit (HOSPITAL_BASED_OUTPATIENT_CLINIC_OR_DEPARTMENT_OTHER): Payer: Self-pay

## 2023-09-18 ENCOUNTER — Other Ambulatory Visit (HOSPITAL_BASED_OUTPATIENT_CLINIC_OR_DEPARTMENT_OTHER): Payer: Self-pay

## 2023-09-20 ENCOUNTER — Ambulatory Visit (INDEPENDENT_AMBULATORY_CARE_PROVIDER_SITE_OTHER): Admitting: Family Medicine

## 2023-09-20 ENCOUNTER — Encounter (INDEPENDENT_AMBULATORY_CARE_PROVIDER_SITE_OTHER): Payer: Self-pay | Admitting: Family Medicine

## 2023-09-20 ENCOUNTER — Other Ambulatory Visit (HOSPITAL_BASED_OUTPATIENT_CLINIC_OR_DEPARTMENT_OTHER): Payer: Self-pay

## 2023-09-20 VITALS — BP 116/73 | HR 74 | Temp 97.8°F | Ht 66.0 in | Wt 186.0 lb

## 2023-09-20 DIAGNOSIS — G20A1 Parkinson's disease without dyskinesia, without mention of fluctuations: Secondary | ICD-10-CM | POA: Diagnosis not present

## 2023-09-20 DIAGNOSIS — E669 Obesity, unspecified: Secondary | ICD-10-CM

## 2023-09-20 DIAGNOSIS — Z6830 Body mass index (BMI) 30.0-30.9, adult: Secondary | ICD-10-CM

## 2023-09-20 DIAGNOSIS — E119 Type 2 diabetes mellitus without complications: Secondary | ICD-10-CM | POA: Diagnosis not present

## 2023-09-20 DIAGNOSIS — Z7985 Long-term (current) use of injectable non-insulin antidiabetic drugs: Secondary | ICD-10-CM | POA: Diagnosis not present

## 2023-09-20 DIAGNOSIS — E1169 Type 2 diabetes mellitus with other specified complication: Secondary | ICD-10-CM

## 2023-09-20 MED ORDER — BUPROPION HCL ER (XL) 150 MG PO TB24
450.0000 mg | ORAL_TABLET | Freq: Every morning | ORAL | 0 refills | Status: DC
  Filled 2023-09-20: qty 270, 90d supply, fill #0

## 2023-09-20 MED ORDER — TIRZEPATIDE 15 MG/0.5ML ~~LOC~~ SOAJ
15.0000 mg | SUBCUTANEOUS | 0 refills | Status: DC
  Filled 2023-09-20 – 2023-10-04 (×4): qty 6, 84d supply, fill #0

## 2023-09-20 NOTE — Progress Notes (Signed)
 Office: [phone]  /  Fax: [phone]  WEIGHT SUMMARY AND BIOMETRICS  Anthropometric Measurements Height: 5' 6 (1.676 m) Weight: 186 lb (84.4 kg) BMI (Calculated): 30.04 Weight at Last Visit: 187 lb Weight Lost Since Last Visit: 1 lb Weight Gained Since Last Visit: 0 Starting Weight: 237 lb Total Weight Loss (lbs): 51 lb (23.1 kg) Peak Weight: 240 lb   Body Composition  Body Fat %: 41.3 % Fat Mass (lbs): 77 lbs Muscle Mass (lbs): 103.6 lbs Total Body Water (lbs): 75 lbs Visceral Fat Rating : 11   Other Clinical Data Fasting: no Labs: no Today's Visit #: 55 Starting Date: 04/16/19    Chief Complaint: OBESITY   History of Present Illness Deanna Rowe is a 68 year old female with obesity and type two diabetes who presents for obesity treatment plan assessment and progress evaluation. She is accompanied by her husband.  She follows her category two prescribed eating plan 75% of the time and engages in physical activities including walking, swimming twice a week, and attending a Parkinson's exercise class at [facility]. She has lost one pound in the last month. She is concerned about losing muscle mass as she loses weight and is trying to increase her activity to mitigate this.  She has type two diabetes and is currently being treated with Mounjaro  at 12.5 mg. Her appetite is not as well controlled by the medication as it was previously, especially at night, where she experiences 'stomach rumbling hunger'. Despite this, she is maintaining her protein intake and not skipping meals. She monitors her blood sugar at home using a continuous glucose monitor, although it was temporarily off due to her husband's work schedule. No issues with low blood sugar and no blood sugar readings above 150.  She is managing Parkinson's disease and is awaiting a follow-up appointment in September after a previous appointment was canceled. She reports increasing her physical activity to  address muscle mass loss and states that she feels stronger. She is planning a trip to visit her daughter in [place] in three weeks       PHYSICAL EXAM:  Blood pressure 116/73, pulse 74, temperature 97.8 F (36.6 C), height 5' 6 (1.676 m), weight 186 lb (84.4 kg), SpO2 99%. Body mass index is 30.02 kg/m.  DIAGNOSTIC DATA REVIEWED:  BMET    Component Value Date/Time   NA 128 (L) 08/27/2022 2108   NA 138 04/05/2022 0942   NA 142 01/12/2017 0914   K 3.9 08/27/2022 2108   K 4.2 01/12/2017 0914   CL 96 (L) 08/27/2022 2108   CO2 24 08/27/2022 2108   CO2 28 01/12/2017 0914   GLUCOSE 84 08/27/2022 2108   GLUCOSE 142 (H) 01/12/2017 0914   BUN 16 08/27/2022 2108   BUN 19 04/05/2022 0942   BUN 12.0 01/12/2017 0914   CREATININE 1.26 (H) 08/27/2022 2108   CREATININE 1.58 (H) 10/12/2018 1121   CREATININE 1.3 (H) 01/12/2017 0914   CALCIUM  8.7 (L) 08/27/2022 2108   CALCIUM  9.6 01/12/2017 0914   GFRNONAA 47 (L) 08/27/2022 2108   GFRNONAA 34 (L) 10/12/2018 1121   GFRAA 44 (L) 03/02/2020 0920   GFRAA 40 (L) 10/12/2018 1121   Lab Results  Component Value Date   HGBA1C 7.9 (H) 04/16/2019   HGBA1C 7.3 09/04/2014   Lab Results  Component Value Date   INSULIN  12.3 04/16/2019   Lab Results  Component Value Date   TSH 1.200 04/05/2022   CBC    Component Value  Date/Time   WBC 5.4 05/15/2023 1149   WBC 6.9 08/27/2022 2108   RBC 3.69 (L) 05/15/2023 1149   RBC 3.54 (L) 08/27/2022 2108   HGB 12.3 05/15/2023 1149   HGB 11.4 (L) 01/12/2017 0913   HCT 36.4 05/15/2023 1149   HCT 35.2 01/12/2017 0913   PLT 242 05/15/2023 1149   MCV 99 (H) 05/15/2023 1149   MCV 100.9 01/12/2017 0913   MCH 33.3 (H) 05/15/2023 1149   MCH 33.6 08/27/2022 2108   MCHC 33.8 05/15/2023 1149   MCHC 34.1 08/27/2022 2108   RDW 12.8 05/15/2023 1149   RDW 14.1 01/12/2017 0913   Iron Studies No results found for: IRON, TIBC, FERRITIN, IRONPCTSAT Lipid Panel     Component Value Date/Time   CHOL  155 04/16/2019 1451   TRIG 71 04/16/2019 1451   HDL 84 04/16/2019 1451   LDLCALC 57 04/16/2019 1451   Hepatic Function Panel     Component Value Date/Time   PROT 7.2 08/27/2022 2108   PROT 7.1 04/05/2022 0942   PROT 7.2 01/12/2017 0914   ALBUMIN 3.5 08/27/2022 2108   ALBUMIN 4.3 04/05/2022 0942   ALBUMIN 3.5 01/12/2017 0914   AST 18 08/27/2022 2108   AST 16 10/12/2018 1121   AST 19 01/12/2017 0914   ALT 5 08/27/2022 2108   ALT <6 10/12/2018 1121   ALT 18 01/12/2017 0914   ALKPHOS 47 08/27/2022 2108   ALKPHOS 43 01/12/2017 0914   BILITOT 0.4 08/27/2022 2108   BILITOT 0.3 04/05/2022 0942   BILITOT 0.2 (L) 10/12/2018 1121   BILITOT 0.28 01/12/2017 0914      Component Value Date/Time   TSH 1.200 04/05/2022 0942   Nutritional Lab Results  Component Value Date   VD25OH 44.8 12/17/2019   VD25OH 40.9 04/16/2019     Assessment and Plan Assessment & Plan Obesity She follows her category two eating plan 75% of the time and has increased her exercise regimen, including walking, swimming, and Parkinson's exercise classes. She lost one pound in the last month. Increased hunger, especially at night, may be related to increased exercise. Mounjaro  may not be adequately controlling her appetite. Increasing Mounjaro  to 15 mg is expected to improve appetite control. - Increase Mounjaro  to 15 mg. - Continue category two eating plan. - Discuss additional recipes for protein-rich, low-calorie desserts. - Continue current exercise regimen.  Type 2 Diabetes Mellitus She is treated with Mounjaro  at 12.5 mg. Her last A1c was 6.0, indicating good glycemic control. She reports no issues with hypoglycemia or hyperglycemia, and her blood sugars have not exceeded 150 mg/dL. She uses a continuous glucose monitor, which her husband helps to apply. Increasing Mounjaro  to 15 mg is anticipated to maintain glycemic control without causing hypoglycemia. - Increase Mounjaro  to 15 mg. - Continue monitoring  blood glucose levels with a continuous glucose monitor.  Parkinson's Disease She actively engages in exercises to mitigate muscle mass loss associated with Parkinson's disease. She has an upcoming neurology appointment in September and is managing her condition well through exercise. - Continue Parkinson's exercise classes. - Attend scheduled neurology appointment in September. -Continue concentrating on strengthening exercises to maintain muscle mass  Follow-up She has upcoming appointments scheduled and plans to travel to [place] to visit her daughter. - Follow up in four weeks. - Attend scheduled appointments on July 28 and August 26. - Plan for September appointment after returning from travel.    She was informed of the importance of frequent follow up visits to  maximize her success with intensive lifestyle modifications for her multiple health conditions.    Jasmine Mesi, MD

## 2023-09-20 NOTE — Addendum Note (Signed)
 Addended by: Woodie Hazard on: 09/20/2023 04:00 PM   Modules accepted: Orders

## 2023-09-21 ENCOUNTER — Other Ambulatory Visit (HOSPITAL_BASED_OUTPATIENT_CLINIC_OR_DEPARTMENT_OTHER): Payer: Self-pay

## 2023-09-22 ENCOUNTER — Other Ambulatory Visit (HOSPITAL_BASED_OUTPATIENT_CLINIC_OR_DEPARTMENT_OTHER): Payer: Self-pay

## 2023-09-22 ENCOUNTER — Other Ambulatory Visit: Payer: Self-pay

## 2023-09-25 ENCOUNTER — Other Ambulatory Visit: Payer: Self-pay

## 2023-09-25 ENCOUNTER — Other Ambulatory Visit (HOSPITAL_BASED_OUTPATIENT_CLINIC_OR_DEPARTMENT_OTHER): Payer: Self-pay

## 2023-09-26 ENCOUNTER — Other Ambulatory Visit: Payer: Self-pay

## 2023-09-26 NOTE — Progress Notes (Signed)
 Specialty Pharmacy Refill Coordination Note  Deanna Rowe is a 68 y.o. female contacted today regarding refills of specialty medication(s) Omalizumab  (Xolair )   Patient requested Mary[facility] at [facility] at [place] date: 09/28/23   Medication will be filled on 06.25.25.

## 2023-09-29 ENCOUNTER — Other Ambulatory Visit (HOSPITAL_COMMUNITY): Payer: Self-pay

## 2023-09-29 ENCOUNTER — Other Ambulatory Visit (HOSPITAL_BASED_OUTPATIENT_CLINIC_OR_DEPARTMENT_OTHER): Payer: Self-pay

## 2023-10-04 ENCOUNTER — Other Ambulatory Visit (HOSPITAL_BASED_OUTPATIENT_CLINIC_OR_DEPARTMENT_OTHER): Payer: Self-pay

## 2023-10-04 ENCOUNTER — Telehealth (INDEPENDENT_AMBULATORY_CARE_PROVIDER_SITE_OTHER): Payer: Self-pay | Admitting: *Deleted

## 2023-10-04 DIAGNOSIS — E538 Deficiency of other specified B group vitamins: Secondary | ICD-10-CM | POA: Diagnosis not present

## 2023-10-04 NOTE — Telephone Encounter (Signed)
 The request has been approved. The authorization is effective from 10/04/2023 to 10/03/2024, as long as the member is enrolled in their current health plan. This has been approved for a max daily dosage of 0.071. A written notification letter will follow with additional details.    Patient notified via Mychart message.

## 2023-10-04 NOTE — Telephone Encounter (Signed)
 Silvia Brightly (KeyBETHA LIMB)  Your information has been sent to [employer].

## 2023-10-09 DIAGNOSIS — E538 Deficiency of other specified B group vitamins: Secondary | ICD-10-CM | POA: Diagnosis not present

## 2023-10-09 DIAGNOSIS — R3914 Feeling of incomplete bladder emptying: Secondary | ICD-10-CM | POA: Diagnosis not present

## 2023-10-09 DIAGNOSIS — N1832 Chronic kidney disease, stage 3b: Secondary | ICD-10-CM | POA: Diagnosis not present

## 2023-10-09 DIAGNOSIS — N3941 Urge incontinence: Secondary | ICD-10-CM | POA: Diagnosis not present

## 2023-10-12 ENCOUNTER — Other Ambulatory Visit (HOSPITAL_BASED_OUTPATIENT_CLINIC_OR_DEPARTMENT_OTHER): Payer: Self-pay

## 2023-10-12 MED ORDER — AMLODIPINE BESYLATE 5 MG PO TABS
5.0000 mg | ORAL_TABLET | Freq: Every day | ORAL | 0 refills | Status: DC
  Filled 2023-10-12: qty 90, 90d supply, fill #0

## 2023-10-16 DIAGNOSIS — N1832 Chronic kidney disease, stage 3b: Secondary | ICD-10-CM | POA: Diagnosis not present

## 2023-10-16 DIAGNOSIS — N2581 Secondary hyperparathyroidism of renal origin: Secondary | ICD-10-CM | POA: Diagnosis not present

## 2023-10-16 DIAGNOSIS — I129 Hypertensive chronic kidney disease with stage 1 through stage 4 chronic kidney disease, or unspecified chronic kidney disease: Secondary | ICD-10-CM | POA: Diagnosis not present

## 2023-10-16 DIAGNOSIS — D631 Anemia in chronic kidney disease: Secondary | ICD-10-CM | POA: Diagnosis not present

## 2023-10-18 ENCOUNTER — Other Ambulatory Visit (HOSPITAL_BASED_OUTPATIENT_CLINIC_OR_DEPARTMENT_OTHER): Payer: Self-pay

## 2023-10-18 DIAGNOSIS — F3181 Bipolar II disorder: Secondary | ICD-10-CM | POA: Diagnosis not present

## 2023-10-18 MED ORDER — ROPINIROLE HCL 2 MG PO TABS
2.0000 mg | ORAL_TABLET | Freq: Two times a day (BID) | ORAL | 1 refills | Status: AC
  Filled 2023-10-18: qty 360, 180d supply, fill #0
  Filled 2024-03-12: qty 180, 90d supply, fill #0

## 2023-10-18 MED ORDER — REXULTI 2 MG PO TABS
2.0000 mg | ORAL_TABLET | Freq: Every morning | ORAL | 1 refills | Status: DC
  Filled 2023-10-18 – 2023-12-12 (×4): qty 90, 90d supply, fill #0
  Filled 2024-03-12: qty 90, 90d supply, fill #1
  Filled 2024-03-12: qty 60, 60d supply, fill #1
  Filled 2024-03-12: qty 30, 30d supply, fill #1

## 2023-10-18 MED ORDER — LORAZEPAM 2 MG PO TABS
2.0000 mg | ORAL_TABLET | Freq: Three times a day (TID) | ORAL | 1 refills | Status: DC
  Filled 2023-10-18 – 2023-12-15 (×3): qty 270, 90d supply, fill #0
  Filled 2024-03-22: qty 270, 90d supply, fill #1

## 2023-10-18 MED ORDER — ZOLPIDEM TARTRATE 5 MG PO TABS
5.0000 mg | ORAL_TABLET | Freq: Every day | ORAL | 1 refills | Status: AC
  Filled 2023-10-18 – 2023-11-20 (×2): qty 90, 90d supply, fill #0

## 2023-10-18 MED ORDER — BUPROPION HCL ER (XL) 150 MG PO TB24
450.0000 mg | ORAL_TABLET | Freq: Every morning | ORAL | 1 refills | Status: DC
  Filled 2023-10-18 – 2023-12-15 (×2): qty 270, 90d supply, fill #0
  Filled 2024-04-03: qty 270, 90d supply, fill #1

## 2023-10-18 MED ORDER — TRINTELLIX 20 MG PO TABS
20.0000 mg | ORAL_TABLET | Freq: Every morning | ORAL | 1 refills | Status: AC
  Filled 2023-10-18 – 2023-12-27 (×4): qty 90, 90d supply, fill #0
  Filled 2024-04-03: qty 90, 90d supply, fill #1

## 2023-10-19 ENCOUNTER — Other Ambulatory Visit: Payer: Self-pay

## 2023-10-19 ENCOUNTER — Other Ambulatory Visit (HOSPITAL_COMMUNITY): Payer: Self-pay

## 2023-10-19 ENCOUNTER — Other Ambulatory Visit (HOSPITAL_BASED_OUTPATIENT_CLINIC_OR_DEPARTMENT_OTHER): Payer: Self-pay

## 2023-10-20 ENCOUNTER — Other Ambulatory Visit (HOSPITAL_BASED_OUTPATIENT_CLINIC_OR_DEPARTMENT_OTHER): Payer: Self-pay

## 2023-10-23 ENCOUNTER — Other Ambulatory Visit (HOSPITAL_COMMUNITY): Payer: Self-pay

## 2023-10-23 ENCOUNTER — Other Ambulatory Visit: Payer: Self-pay

## 2023-10-23 NOTE — Progress Notes (Signed)
 Specialty Pharmacy Refill Coordination Note  Spoke with Zandria P Hirschhorn  Rolonda MYLEY BAHNER is a 68 y.o. female contacted today regarding refills of specialty medication(s) Omalizumab  (Xolair )  Doses on hand: 0   Injection date: 10/27/23  Patient requested: Pickup at [facility] at [place] date: 10/25/23  Medication will be filled on 10/24/23.

## 2023-10-24 ENCOUNTER — Ambulatory Visit: Admitting: Neurology

## 2023-10-27 ENCOUNTER — Ambulatory Visit (INDEPENDENT_AMBULATORY_CARE_PROVIDER_SITE_OTHER): Admitting: Neurology

## 2023-10-27 ENCOUNTER — Other Ambulatory Visit (HOSPITAL_BASED_OUTPATIENT_CLINIC_OR_DEPARTMENT_OTHER): Payer: Self-pay

## 2023-10-27 ENCOUNTER — Encounter: Payer: Self-pay | Admitting: Neurology

## 2023-10-27 VITALS — BP 127/80 | HR 74 | Ht 66.0 in | Wt 191.4 lb

## 2023-10-27 DIAGNOSIS — G20C Parkinsonism, unspecified: Secondary | ICD-10-CM | POA: Diagnosis not present

## 2023-10-27 MED ORDER — CARBIDOPA-LEVODOPA ER 50-200 MG PO TBCR
1.0000 | EXTENDED_RELEASE_TABLET | Freq: Four times a day (QID) | ORAL | 0 refills | Status: DC
  Filled 2023-10-27: qty 360, 90d supply, fill #0

## 2023-10-27 NOTE — Progress Notes (Deleted)
 Subjective:    Patient ID: Deanna Rowe is a 68 y.o. female.  HPI    Interim history:  Deanna Rowe is a very pleasant 68 y.o.-year-old {Handed:22697}-handed {Desc; female/female:11659}, with an underlying history of ***, who presents for followup consultation of ***. The patient is *** unaccompanied today. I first met *** on ***, at which time I suggested ***.   The patient's allergies, current medications, family history, past medical history, past social history, past surgical history and problem list were reviewed and updated as appropriate.    {Desc; his/her:32168} Past Medical History Is Significant For: Past Medical History:  Diagnosis Date   Anemia    Anxiety    Arthritis    L HIP   Asthma    Avascular necrosis of hip (HCC)    LEFT   Back pain    Chest pain    Constipation    Depression    Diabetes mellitus    Diabetes mellitus, type II (HCC)    Dry cough    Edema, lower extremity    Gait abnormality 11/18/2019   GERD (gastroesophageal reflux disease)    High cholesterol    Hypertension    Insomnia    takes Ambien  nightly   Joint pain    Kidney disease    Kidney disease    Obesity    Parkinsonism (HCC) 03/02/2017   PONV (postoperative nausea and vomiting)    RLS (restless legs syndrome) 01/15/2018   Sleep apnea    Swallowing difficulty    Thyroid  disease    TIA (transient ischemic attack) 2012   no residual problems   Urgency incontinence    Vitamin D  deficiency     {Desc; his/her:32168} Past Surgical History Is Significant For: Past Surgical History:  Procedure Laterality Date   DILATION AND CURETTAGE OF UTERUS     ESOPHAGEAL MANOMETRY N/A 09/03/2012   Procedure: ESOPHAGEAL MANOMETRY (EM);  Surgeon: Gladis MARLA Louder, MD;  Location: [facility];  Service: Endoscopy;  Laterality: N/A;   ESOPHAGEAL MANOMETRY N/A 06/19/2017   Procedure: ESOPHAGEAL MANOMETRY (EM);  Surgeon: Rosalie Kitchens, MD;  Location: [facility];  Service: Endoscopy;  Laterality:  N/A;   EXPLORATORY LAPAROTOMY     GANGLION CYST EXCISION     JOINT REPLACEMENT  2011   rt total hip   KNEE ARTHROSCOPY     PILONIDAL CYST / SINUS EXCISION     TOTAL HIP ARTHROPLASTY Left 10/09/2013   Procedure: LEFT TOTAL HIP ARTHROPLASTY ANTERIOR APPROACH;  Surgeon: Dempsey Melodi GAILS, MD;  Location: [facility];  Service: Orthopedics;  Laterality: Left;    {Desc; his/her:32168} Family History Is Significant For: Family History  Problem Relation Age of Onset   High blood pressure Mother    Hyperlipidemia Mother    Heart disease Mother    Heart disease Father    Alcoholism Father    Allergic rhinitis Sister    Alcohol  abuse Brother    Alcohol  abuse Brother    Alcohol  abuse Brother    Alcohol  abuse Brother    Alzheimer's disease Maternal Aunt    Healthy Child    Diabetes Neg Hx    Angioedema Neg Hx    Asthma Neg Hx    Eczema Neg Hx    Immunodeficiency Neg Hx    Urticaria Neg Hx    Breast cancer Neg Hx    Parkinson's disease Neg Hx    Sleep apnea Neg Hx     {Desc; his/her:32168} Social History Is Significant For: Social  History   Socioeconomic History   Marital status: Married    Spouse name: Marinda   Number of children: Not on file   Years of education: Not on file   Highest education level: Not on file  Occupational History   Occupation: retired Charity fundraiser  Tobacco Use   Smoking status: Never   Smokeless tobacco: Never  Vaping Use   Vaping status: Never Used  Substance and Sexual Activity   Alcohol  use: No    Alcohol /week: 0.0 standard drinks of alcohol    Drug use: No   Sexual activity: Not Currently  Other Topics Concern   Not on file  Social History Narrative   Lives with husband   Caffeine use: none   Right handed    Social Drivers of Corporate investment banker Strain: Not on file  Food Insecurity: Low Risk  (02/13/2023)   Received from [facility]   Hunger Vital Sign    Within the past 12 months, you worried that your food would run out before you got money  to buy more: Never true    Within the past 12 months, the food you bought just didn't last and you didn't have money to get more. : Never true  Transportation Needs: No Transportation Needs (02/13/2023)   Received from [facility]    In the past 12 months, has lack of reliable transportation kept you from medical appointments, meetings, work or from getting things needed for daily living? : No  Physical Activity: Not on file  Stress: Not on file  Social Connections: Not on file    {Desc; his/her:32168} Allergies Are:  Allergies  Allergen Reactions   Fluticasone -Salmeterol Anaphylaxis   Codeine Nausea And Vomiting   Fetzima [Levomilnacipran] Nausea And Vomiting   Nsaids Other (See Comments)    Upset stomach    Trulicity [Dulaglutide] Nausea And Vomiting    Significant and undesirably rapid (40 lbs) weight loss    Xanax [Alprazolam] Other (See Comments)    disoriented   Amoxicillin Rash    Has patient had a PCN reaction causing immediate rash, facial/tongue/throat swelling, SOB or lightheadedness with hypotension: Yes Has patient had a PCN reaction causing severe rash involving mucus membranes or skin necrosis: No Has patient had a PCN reaction that required hospitalization: No Has patient had a PCN reaction occurring within the last 10 years: No If all of the above answers are NO, then may proceed with Cephalosporin use.    Pseudoephedrine  Hcl Er Palpitations  :   {Desc; his/her:32168} Current Medications Are:  Outpatient Encounter Medications as of 10/27/2023  Medication Sig   Accu-Chek FastClix Lancets MISC Use to check blood sugar 3 times daily.   ACCU-CHEK GUIDE test strip USE TO CHECK BLOOD SUGAR 3 TIMES PER DAY. (Patient taking differently: Use to check blood sugar 4 times per day.)   albuterol  (VENTOLIN  HFA) 108 (90 Base) MCG/ACT inhaler Inhale 2 puffs into the lungs as needed for wheezing or shortness of breath.   amLODipine  (NORVASC ) 5 MG tablet Take  5 mg by mouth daily.   amLODipine  (NORVASC ) 5 MG tablet Take 1 tablet (5 mg total) by mouth daily.   aspirin  EC 81 MG tablet Take 81 mg by mouth every evening.   atorvastatin  (LIPITOR) 10 MG tablet Take 1 tablet (10 mg total) by mouth at bedtime.   azelastine  (ASTELIN ) 0.1 % nasal spray Place 2 sprays into both nostrils 2 (two) times daily as needed.   brexpiprazole  (REXULTI ) 2  MG TABS tablet Take 1 tablet (2 mg total) by mouth in the morning.   buPROPion  (WELLBUTRIN  XL) 150 MG 24 hr tablet Take 3 tablets (450 mg total) by mouth in the morning.   Calcium  Carbonate-Vitamin D  600-400 MG-UNIT tablet Take 1 tablet by mouth daily.   carbidopa -levodopa  (SINEMET  CR) 50-200 MG tablet Take 1 tablet by mouth 5 (five) times daily.   carbidopa -levodopa  (SINEMET  IR) 25-250 MG tablet Take 1 tablet by mouth every morning AND 1 tablet daily in the afternoon at 2 pm.   Continuous Blood Gluc Sensor (DEXCOM G7 SENSOR) MISC Use to check blood sugar and change sensor every 10 days   Dapagliflozin -sAXagliptin 5-5 MG TABS Take 5 mg by mouth daily at 6 (six) AM.   EPINEPHrine  (EPIPEN  2-PAK) 0.3 mg/0.3 mL IJ SOAJ injection Inject 0.3 mg into the muscle as needed for anaphylaxis.   gabapentin  (NEURONTIN ) 300 MG capsule Take 1 capsule (300 mg total) by mouth 3 (three) times daily.   glucose blood test strip Use to check blood sugar 4 times daily   LORazepam  (ATIVAN ) 2 MG tablet Take 1 tablet (2 mg total) by mouth 3 (three) times daily.   losartan  (COZAAR ) 50 MG tablet Take 1 tablet (50 mg total) by mouth daily.   Olopatadine  HCl (PATADAY ) 0.2 % SOLN Place 1 drop into both eyes daily as needed.   omalizumab  (XOLAIR ) 300 MG/2  ML prefilled syringe Inject 300 mg into the skin every 28 (twenty-eight) days.   oxybutynin  (DITROPAN -XL) 10 MG 24 hr tablet Take 1 tablet (10 mg total) by mouth daily.   propranolol  (INDERAL ) 40 MG tablet Take 1 tablet (40 mg total) by mouth 2 (two) times daily.   rOPINIRole  (REQUIP ) 2 MG tablet Take  1 tablet (2 mg total) by mouth 2 (two) times daily.   tirzepatide  (MOUNJARO ) 15 MG/0.5ML Pen Inject 15 mg into the skin once a week.   triamcinolone  (NASACORT ) 55 MCG/ACT AERO nasal inhaler Place 2 sprays into the nose daily.   valACYclovir  (VALTREX ) 500 MG tablet Take 1 tablet (500 mg total) by mouth daily.   vitamin C (ASCORBIC ACID) 500 MG tablet Take 500 mg by mouth daily.   Vitamin D , Ergocalciferol , 2000 units CAPS Take 1 capsule by mouth daily. 2000 units daily.   vortioxetine  HBr (TRINTELLIX ) 20 MG TABS tablet Take 1 tablet (20 mg total) by mouth in the morning.   zolpidem  (AMBIEN ) 5 MG tablet Take 1 tablet (5 mg total) by mouth at bedtime.   ACCU-CHEK FASTCLIX LANCETS MISC USE TO CHECK BLOOD SUGAR 3 TIMES A DAY (Patient not taking: Reported on 10/27/2023)   Blood Glucose Monitoring Suppl (FREESTYLE LITE) w/Device KIT Use to check blood sugar three times daily. (Patient not taking: Reported on 10/27/2023)   rOPINIRole  (REQUIP ) 2 MG tablet Take 1 tablet (2 mg total) by mouth 2 (two) times daily. (Patient not taking: Reported on 10/27/2023)   Facility-Administered Encounter Medications as of 10/27/2023  Medication   tranexamic acid  (CYKLOKAPRON ) topical -INTRAOP  :  Review of Systems:  Out of a complete 14 point review of systems, all are reviewed and negative with the exception of these symptoms as listed below:  Review of Systems  Neurological:        Pt in room 5. Husband in room.Here for follow up. Pt reports doing well no fall. Pt reports tremors are stable. Pt refused wearing cpap was not able to find a mask that was comfortable, pt returned cpap machine.  Objective:  Neurological Exam  Physical Exam  Assessment:   ***  Plan:   ***

## 2023-10-27 NOTE — Progress Notes (Signed)
 Subjective:    Patient ID: Deanna Rowe is a 68 y.o. female.  HPI    Interim history:   Ms. Deanna Rowe is a 68 year old right-handed woman with an underlying medical history of asthma, arthritis, sleep apnea, reflux disease, hypertension, hyperlipidemia, avascular necrosis of the hip, back pain, history of chest pain, depression, diabetes, restless leg syndrome, vitamin D  deficiency, parkinsonism, and obesity, who presents for follow-up consultation of her parkinsonism. The patient is accompanied by her husband today.   Today, 10/27/23: she reports feeling stable, no recent falls, no recent exacerbation of symptoms.  She still takes a lot of medications and the high-dose levodopa , takes ropinirole  2 mg twice daily per psychiatry.  She also continues to take Trintellix , Ambien , gabapentin , Ativan , Rexulti , Wellbutrin .  She recently saw her psychiatrist.  She continues to take levodopa  25-250 mg strength twice daily and long-acting levodopa  50-200 mg strength 5 times a day.  She is willing to cut back on her levodopa  to see if she can get by with less.  She saw Dr. 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    The patient's allergies, current medications, family history, past medical history, past social history, past surgical history and problem list were reviewed and updated as appropriate.    Previously:   03/13/2023 [name], NP): << Deanna Rowe is a 68 y.o. female with a history of parkinsonism and obstructive sleep apnea. Returns today for follow-up.  Patient saw Dr. Evonnie in July.  They discussed doing skin biopsies however the patient states that they were never contacted by the office regarding scheduling this.  Patient states that they plan to follow-up through our office.  She remains on Sinemet  CR 5 times a day and Sinemet  IR at 6 AM and 2 PM.  She states that this medication combination is working well for her.  She  states that if she misses a dose or she is late her symptoms return and she is more prone to fall.  Denies a tremor.  Reports that she has trouble swallowing food and has to make sure that it is a small amount.  Denies any trouble sleeping.  She had a fall approximately 3 weeks ago.  No injuries other than scraping her arm.  >> 08/30/22 (SA): She reports feeling stable, she does not sleep well however.  She is on Ambien  per psychiatry.  She also takes gabapentin  3 times a day and lorazepam  3 times a day.  Her husband reports that she has been taking the Sinemet  CR 4 times a day, they were under the impression that it would be okay to scale back but patient would like to go back to 1 pill 5 times a day, I explained to them that we did not discuss cutting back on the Sinemet  CR at the last visit.  She continues to take Sinemet  IR 25-250 mg strength 1 pill twice daily and ropinirole  2 mg twice daily. She has fallen in the recent past, she has not injured herself, she has a walker but does not consistently use her walker.  She hydrates well with water.  She does not use her Pap machine any longer.  She is willing to make a follow-up appointment with Dr. Chalice.  She had a follow-up appointment with ophthalmology and has prescription eyeglasses.  She reports that she was told she had mild retinopathy.   She saw Duwaine Russell, NP on 12/02/21, at which time the  patient reported 5 falls within the past 2 or 3 weeks.  She was not using her CPAP machine as she could not tolerate it.  She was advised to use her cane at all times.  She was referred to physical therapy.        I saw her on 08/05/2021, at which time she reported feeling stable.  She was on Sinemet  CR 1 pill 5 times a day and ropinirole  2 mg strength twice daily as well as Sinemet  IR 1 pill twice daily. She was advised to continue with her medication regimen.   I first met her on 01/29/2021, at which time she was advised to continue with Sinemet  CR 1  pill 5 times a day and Sinemet  IR 1 pill once daily or up to twice daily. She was previously followed by Dr. Carlin Bull.  She saw Duwaine Russell, NP in the interim on 06/07/2021 for sleep apnea follow-up.  She reported that she was not able to use her PAP machine consistently due to chronic cough.  She was advised to restart CPAP therapy.  She was advised to continue with Sinemet  CR 1 pill 5 times a day and Sinemet  IR twice daily.  She was advised to use her cane at all times.     01/29/21: 68 year old right-handed woman with an underlying complex medical history of asthma, arthritis, anemia, avascular necrosis of the hip, depression, anxiety, type 2 diabetes, reflux disease, hyperlipidemia, hypertension, kidney disease, restless leg syndrome, history of TIA, thyroid  disease, vitamin D  deficiency, and obesity, who was diagnosed with parkinsonism several years ago.  She was noted to have right-sided signs.  She was on Abilify  in the past.  She is no longer on Abilify  for the past 2 years, but she is on multiple psychotropic medications including lorazepam , bupropion , Rexulti , Trintellix , and also takes gabapentin  as well as zolpidem .  She has clearly stopped the zolpidem  about 3 weeks ago and is currently on melatonin 5 mg each night which has been helpful.  She is on ropinirole  for restless leg syndrome per primary care, currently on 2 mg twice daily.  For parkinsonism she is on Sinemet  CR, 50-200 mg strength 1 pill 5 times a day.  She last saw Dr. Bull on 08/26/2020, at which time she was advised to add Sinemet  IR 25-250 mg strength, 1 pill twice daily, in the morning and at 2 PM daily.     She has a history of urinary incontinence and is currently on Myrbetriq  and Vesicare  per urology.  She has chronic constipation for at least 1 year and has been on MiraLAX  daily, and 2 stool softeners daily. She has been using a cane.  Since the last appointment, she has not fallen thankfully.  She believes that the  addition of Sinemet  IR has helped but she only takes 1 in the morning.   She had a brain MRI without contrast on 03/15/2017 and I reviewed the results:   IMPRESSION: Slightly abnormal MRI scan of the brain showing prominent changes of chronic microvascular ischemia.  No acute abnormalities noted.  Overall no significant change compared with prior MRI scan dated 05/23/2014.   For her sleep apnea, she is followed by Dr. Chalice and the nurse practitioner.  She reports 50% compliance with her CPAP.  Sometimes acid reflux bothers her at night which bothers her CPAP usage.   She is followed by Dr. Jess in psychiatry.   08/26/20 (Dr. Bull): << Ms. Appelbaum is a 68 year old right-handed black female  with a history of Parkinson's disease, depression, diabetes, and sleep apnea.  The patient is currently in physical therapy twice weekly.  She has not had any falls in about 2 weeks.  She claims that she has gained improvement with taking the Sinemet  CR more frequently, but she still has problems with wearing off around 2 PM.  The patient may freeze up and fall at times.  The patient may use a cane or walker for ambulation.  She is tolerating medications fairly well, she denies any excessive daytime drowsiness currently.  She returns to the office today for further evaluation.>>     Her Past Medical History Is Significant For: Past Medical History:  Diagnosis Date   Anemia    Anxiety    Arthritis    L HIP   Asthma    Avascular necrosis of hip (HCC)    LEFT   Back pain    Chest pain    Constipation    Depression    Diabetes mellitus    Diabetes mellitus, type II (HCC)    Dry cough    Edema, lower extremity    Gait abnormality 11/18/2019   GERD (gastroesophageal reflux disease)    High cholesterol    Hypertension    Insomnia    takes Ambien  nightly   Joint pain    Kidney disease    Kidney disease    Obesity    Parkinsonism (HCC) 03/02/2017   PONV (postoperative nausea and vomiting)    RLS  (restless legs syndrome) 01/15/2018   Sleep apnea    Swallowing difficulty    Thyroid  disease    TIA (transient ischemic attack) 2012   no residual problems   Urgency incontinence    Vitamin D  deficiency     Her Past Surgical History Is Significant For: Past Surgical History:  Procedure Laterality Date   DILATION AND CURETTAGE OF UTERUS     ESOPHAGEAL MANOMETRY N/A 09/03/2012   Procedure: ESOPHAGEAL MANOMETRY (EM);  Surgeon: Gladis MARLA Louder, MD;  Location: [facility];  Service: Endoscopy;  Laterality: N/A;   ESOPHAGEAL MANOMETRY N/A 06/19/2017   Procedure: ESOPHAGEAL MANOMETRY (EM);  Surgeon: Rosalie Kitchens, MD;  Location: [facility];  Service: Endoscopy;  Laterality: N/A;   EXPLORATORY LAPAROTOMY     GANGLION CYST EXCISION     JOINT REPLACEMENT  2011   rt total hip   KNEE ARTHROSCOPY     PILONIDAL CYST / SINUS EXCISION     TOTAL HIP ARTHROPLASTY Left 10/09/2013   Procedure: LEFT TOTAL HIP ARTHROPLASTY ANTERIOR APPROACH;  Surgeon: Dempsey Melodi GAILS, MD;  Location: [facility];  Service: Orthopedics;  Laterality: Left;    Her Family History Is Significant For: Family History  Problem Relation Age of Onset   High blood pressure Mother    Hyperlipidemia Mother    Heart disease Mother    Heart disease Father    Alcoholism Father    Allergic rhinitis Sister    Alcohol  abuse Brother    Alcohol  abuse Brother    Alcohol  abuse Brother    Alcohol  abuse Brother    Alzheimer's disease Maternal Aunt    Healthy Child    Diabetes Neg Hx    Angioedema Neg Hx    Asthma Neg Hx    Eczema Neg Hx    Immunodeficiency Neg Hx    Urticaria Neg Hx    Breast cancer Neg Hx    Parkinson's disease Neg Hx    Sleep apnea Neg Hx  Her Social History Is Significant For: Social History   Socioeconomic History   Marital status: Married    Spouse name: Marinda   Number of children: Not on file   Years of education: Not on file   Highest education level: Not on file  Occupational History    Occupation: retired Charity fundraiser  Tobacco Use   Smoking status: Never   Smokeless tobacco: Never  Vaping Use   Vaping status: Never Used  Substance and Sexual Activity   Alcohol  use: No    Alcohol /week: 0.0 standard drinks of alcohol    Drug use: No   Sexual activity: Not Currently  Other Topics Concern   Not on file  Social History Narrative   Lives with husband   Caffeine use: none   Right handed    Social Drivers of Corporate investment banker Strain: Not on file  Food Insecurity: Low Risk  (02/13/2023)   Received from [facility]   Hunger Vital Sign    Within the past 12 months, you worried that your food would run out before you got money to buy more: Never true    Within the past 12 months, the food you bought just didn't last and you didn't have money to get more. : Never true  Transportation Needs: No Transportation Needs (02/13/2023)   Received from [facility]    In the past 12 months, has lack of reliable transportation kept you from medical appointments, meetings, work or from getting things needed for daily living? : No  Physical Activity: Not on file  Stress: Not on file  Social Connections: Not on file    Her Allergies Are:  Allergies  Allergen Reactions   Fluticasone -Salmeterol Anaphylaxis   Codeine Nausea And Vomiting   Fetzima [Levomilnacipran] Nausea And Vomiting   Nsaids Other (See Comments)    Upset stomach    Trulicity [Dulaglutide] Nausea And Vomiting    Significant and undesirably rapid (40 lbs) weight loss    Xanax [Alprazolam] Other (See Comments)    disoriented   Amoxicillin Rash    Has patient had a PCN reaction causing immediate rash, facial/tongue/throat swelling, SOB or lightheadedness with hypotension: Yes Has patient had a PCN reaction causing severe rash involving mucus membranes or skin necrosis: No Has patient had a PCN reaction that required hospitalization: No Has patient had a PCN reaction occurring within the last 10  years: No If all of the above answers are NO, then may proceed with Cephalosporin use.    Pseudoephedrine  Hcl Er Palpitations  :   Her Current Medications Are:  Outpatient Encounter Medications as of 10/27/2023  Medication Sig   Accu-Chek FastClix Lancets MISC Use to check blood sugar 3 times daily.   ACCU-CHEK GUIDE test strip USE TO CHECK BLOOD SUGAR 3 TIMES PER DAY. (Patient taking differently: Use to check blood sugar 4 times per day.)   albuterol  (VENTOLIN  HFA) 108 (90 Base) MCG/ACT inhaler Inhale 2 puffs into the lungs as needed for wheezing or shortness of breath.   amLODipine  (NORVASC ) 5 MG tablet Take 5 mg by mouth daily.   amLODipine  (NORVASC ) 5 MG tablet Take 1 tablet (5 mg total) by mouth daily.   aspirin  EC 81 MG tablet Take 81 mg by mouth every evening.   atorvastatin  (LIPITOR) 10 MG tablet Take 1 tablet (10 mg total) by mouth at bedtime.   azelastine  (ASTELIN ) 0.1 % nasal spray Place 2 sprays into both nostrils 2 (two) times daily as needed.  brexpiprazole  (REXULTI ) 2 MG TABS tablet Take 1 tablet (2 mg total) by mouth in the morning.   buPROPion  (WELLBUTRIN  XL) 150 MG 24 hr tablet Take 3 tablets (450 mg total) by mouth in the morning.   Calcium  Carbonate-Vitamin D  600-400 MG-UNIT tablet Take 1 tablet by mouth daily.   carbidopa -levodopa  (SINEMET  CR) 50-200 MG tablet Take 1 tablet by mouth 5 (five) times daily.   carbidopa -levodopa  (SINEMET  IR) 25-250 MG tablet Take 1 tablet by mouth every morning AND 1 tablet daily in the afternoon at 2 pm.   Continuous Blood Gluc Sensor (DEXCOM G7 SENSOR) MISC Use to check blood sugar and change sensor every 10 days   Dapagliflozin -sAXagliptin 5-5 MG TABS Take 5 mg by mouth daily at 6 (six) AM.   EPINEPHrine  (EPIPEN  2-PAK) 0.3 mg/0.3 mL IJ SOAJ injection Inject 0.3 mg into the muscle as needed for anaphylaxis.   gabapentin  (NEURONTIN ) 300 MG capsule Take 1 capsule (300 mg total) by mouth 3 (three) times daily.   glucose blood test strip  Use to check blood sugar 4 times daily   LORazepam  (ATIVAN ) 2 MG tablet Take 1 tablet (2 mg total) by mouth 3 (three) times daily.   losartan  (COZAAR ) 50 MG tablet Take 1 tablet (50 mg total) by mouth daily.   Olopatadine  HCl (PATADAY ) 0.2 % SOLN Place 1 drop into both eyes daily as needed.   omalizumab  (XOLAIR ) 300 MG/2  ML prefilled syringe Inject 300 mg into the skin every 28 (twenty-eight) days.   oxybutynin  (DITROPAN -XL) 10 MG 24 hr tablet Take 1 tablet (10 mg total) by mouth daily.   propranolol  (INDERAL ) 40 MG tablet Take 1 tablet (40 mg total) by mouth 2 (two) times daily.   rOPINIRole  (REQUIP ) 2 MG tablet Take 1 tablet (2 mg total) by mouth 2 (two) times daily.   tirzepatide  (MOUNJARO ) 15 MG/0.5ML Pen Inject 15 mg into the skin once a week.   triamcinolone  (NASACORT ) 55 MCG/ACT AERO nasal inhaler Place 2 sprays into the nose daily.   valACYclovir  (VALTREX ) 500 MG tablet Take 1 tablet (500 mg total) by mouth daily.   vitamin C (ASCORBIC ACID) 500 MG tablet Take 500 mg by mouth daily.   Vitamin D , Ergocalciferol , 2000 units CAPS Take 1 capsule by mouth daily. 2000 units daily.   vortioxetine  HBr (TRINTELLIX ) 20 MG TABS tablet Take 1 tablet (20 mg total) by mouth in the morning.   zolpidem  (AMBIEN ) 5 MG tablet Take 1 tablet (5 mg total) by mouth at bedtime.   ACCU-CHEK FASTCLIX LANCETS MISC USE TO CHECK BLOOD SUGAR 3 TIMES A DAY (Patient not taking: Reported on 10/27/2023)   Blood Glucose Monitoring Suppl (FREESTYLE LITE) w/Device KIT Use to check blood sugar three times daily. (Patient not taking: Reported on 10/27/2023)   rOPINIRole  (REQUIP ) 2 MG tablet Take 1 tablet (2 mg total) by mouth 2 (two) times daily. (Patient not taking: Reported on 10/27/2023)   Facility-Administered Encounter Medications as of 10/27/2023  Medication   tranexamic acid  (CYKLOKAPRON ) topical -INTRAOP  :  Review of Systems:  Out of a complete 14 point review of systems, all are reviewed and negative with the  exception of these symptoms as listed below:       Review of Systems  Objective:  Neurological Exam  Physical Exam Physical Examination:   Vitals:   10/27/23 1104  BP: 127/80  Pulse: 74    General Examination: The patient is a very pleasant 68 y.o. female in no acute distress. She  appears well-developed and well-nourished and well groomed.   HEENT: Normocephalic, atraumatic, pupils are equal, round and reactive to light, corrective eyeglasses in place, bilateral cataracts.  Extraocular tracking mildly impaired, stable.  Overall mild nuchal rigidity noted, mild facial masking, mild hypophonia, intermittent mild lower lip and jaw tremor, all stable.  No dysarthria.  Airway examination reveals moderate mouth dryness. Hearing is grossly intact, bilateral hearing aids in place.   Chest: Clear to auscultation without wheezing, rhonchi or crackles noted.   Heart: S1+S2+0, regular and normal without murmurs, rubs or gallops noted.    Abdomen: Soft, non-tender and non-distended.  Normal bowel sounds.    Extremities: There is no pitting edema in the distal lower extremities bilaterally.    Skin: Warm and dry without trophic changes noted.    Musculoskeletal: exam reveals no obvious joint deformities.    Neurologically:  Mental status: The patient is awake, alert and oriented in all 4 spheres. Her immediate and remote memory, attention, language skills and fund of knowledge are fairly appropriate, she does not provide a whole lot of details to her history, no active hallucinations.  Speech is as above. Thought process is linear. Mood is normal and affect is flat.  Somewhat slow to respond at times. Cranial nerves II - XII are as described above under HEENT exam.  Motor exam: Normal bulk, strength and tone is noted. There is no resting or postural tremor currently.  Romberg is not tested for safety concerns. Moderate bradykinesia is noted.  Fine motor skills are globally mild to  moderately impaired, no telltale lateralization noted. Cebellar testing: No dysmetria or intention tremor. There is no truncal or gait ataxia.  Sensory exam: intact to light touch in the upper and lower extremities.  Gait, station and balance: She stands slowly, posture is mildly stooped for age.  She walks slowly and cautiously, has a cane.     Assessment and Plan:    In summary, Eriko NAIRI OSWALD is a 68 year old right-handed woman with an underlying medical history of asthma, arthritis, sleep apnea, reflux disease, hypertension, hyperlipidemia, avascular necrosis of the hip, back pain, history of chest pain, depression, diabetes, restless leg syndrome, vitamin D  deficiency, parkinsonism, and obesity, who presents for follow-up consultation of her parkinsonism with the possibility of drug-induced parkinsonism given her mental health history and medication regimen.   I talked to them about streamlining her medication regimen at this time. She is advised to cut back on her Sinemet  by reducing her Sinemet  IR to 1 pill once daily for 2 weeks and then stop it completely.  She is advised to reduce her Sinemet  CR 1 pill 5 times a day currently to 1 pill 4 times a day in about a month from now.  She is advised to follow-up routinely in our clinic to see Duwaine Russell, NP in about 6 to 9 months routinely.  Of note, she does take a number of psychotropic medications unfortunately which complicates the situation. She had an EEG through our office in January 2024 which showed normal findings for awake and drowsy states.  We also did labs in January 2024. I answered all the questions today and the patient and her husband were in agreement. I spent 40 minutes in total face-to-face time and in reviewing records during pre-charting, more than 50% of which was spent in counseling and coordination of care, reviewing test results, reviewing medications and treatment regimen and/or in discussing or reviewing the diagnosis  of parkinsonism, the prognosis and treatment options.  Pertinent laboratory and imaging test results that were available during this visit with the patient were reviewed by me and considered in my medical decision making (see chart for details).

## 2023-10-27 NOTE — Patient Instructions (Signed)
 Please reduce your Sinemet  IR 25-to 50 mg strength 1 pill once daily in the morning for the next 2 weeks and then you can stop it. Please reduce your Sinemet  CR 50-200 mg strength to 1 pill 4 times a day in about a month from now.

## 2023-10-30 ENCOUNTER — Encounter (INDEPENDENT_AMBULATORY_CARE_PROVIDER_SITE_OTHER): Payer: Self-pay | Admitting: Family Medicine

## 2023-10-30 ENCOUNTER — Ambulatory Visit (INDEPENDENT_AMBULATORY_CARE_PROVIDER_SITE_OTHER): Admitting: Family Medicine

## 2023-10-30 ENCOUNTER — Other Ambulatory Visit (HOSPITAL_BASED_OUTPATIENT_CLINIC_OR_DEPARTMENT_OTHER): Payer: Self-pay

## 2023-10-30 VITALS — BP 112/71 | HR 71 | Temp 97.5°F | Ht 66.0 in | Wt 186.0 lb

## 2023-10-30 DIAGNOSIS — G20A1 Parkinson's disease without dyskinesia, without mention of fluctuations: Secondary | ICD-10-CM

## 2023-10-30 DIAGNOSIS — Z794 Long term (current) use of insulin: Secondary | ICD-10-CM

## 2023-10-30 DIAGNOSIS — E119 Type 2 diabetes mellitus without complications: Secondary | ICD-10-CM | POA: Diagnosis not present

## 2023-10-30 DIAGNOSIS — E66812 Obesity, class 2: Secondary | ICD-10-CM

## 2023-10-30 DIAGNOSIS — Z6830 Body mass index (BMI) 30.0-30.9, adult: Secondary | ICD-10-CM

## 2023-10-30 DIAGNOSIS — E669 Obesity, unspecified: Secondary | ICD-10-CM

## 2023-10-30 DIAGNOSIS — F439 Reaction to severe stress, unspecified: Secondary | ICD-10-CM

## 2023-10-30 MED ORDER — TIRZEPATIDE 15 MG/0.5ML ~~LOC~~ SOAJ
15.0000 mg | SUBCUTANEOUS | 0 refills | Status: DC
  Filled 2023-10-30 – 2023-12-21 (×2): qty 6, 84d supply, fill #0

## 2023-10-30 NOTE — Progress Notes (Signed)
 Office: [phone]  /  Fax: [phone]  WEIGHT SUMMARY AND BIOMETRICS  Anthropometric Measurements Height: 5' 6 (1.676 m) Weight: 186 lb (84.4 kg) BMI (Calculated): 30.04 Weight at Last Visit: 186 lb Weight Lost Since Last Visit: 0 Weight Gained Since Last Visit: 0 Starting Weight: 237 lb Total Weight Loss (lbs): 51 lb (23.1 kg) Peak Weight: 240 lb   Body Composition  Body Fat %: 40.4 % Fat Mass (lbs): 75.2 lbs Muscle Mass (lbs): 105.2 lbs Total Body Water (lbs): 73 lbs Visceral Fat Rating : 11   Other Clinical Data Fasting: no Labs: no Today's Visit #: 56 Starting Date: 04/16/19    Chief Complaint: OBESITY    History of Present Illness Deanna Rowe is a 68 year old female with obesity and type two diabetes who presents to discuss her treatment plan.  She follows a category two eating plan about fifty percent of the time and attempts to exercise for forty-five minutes, four times a week. Despite significant stress, she has maintained her weight at 186 pounds. She is eating more salads and meat, and drinking lots of water.  She is working on diet, exercise, and weight loss as part of her type two diabetes management. She is currently on Mounjaro , which she started at a 15 mg dose last Wednesday, and is tolerating it well. She requests a refill of this medication.  She experiences high levels of stress due to family responsibilities, including helping her older sister with osteoporosis and her younger sister with cerebral palsy, who recently had a seizure after twenty years. This has necessitated moving her younger sister to a nursing home, which has been emotionally challenging. Despite the stress, she feels she is 'catching up now'.  She reports good support from her husband and states that her sleep is good, as she is very tired and has no trouble falling asleep. She continues to attend exercise classes, including those for Parkinson's, and feels she is  doing well overall. She reports that her neurologist recently reduced her Levodopa  dosage.      PHYSICAL EXAM:  Blood pressure 112/71, pulse 71, temperature (!) 97.5 F (36.4 C), height 5' 6 (1.676 m), weight 186 lb (84.4 kg), SpO2 97%. Body mass index is 30.02 kg/m.  DIAGNOSTIC DATA REVIEWED:  BMET    Component Value Date/Time   NA 128 (L) 08/27/2022 2108   NA 138 04/05/2022 0942   NA 142 01/12/2017 0914   K 3.9 08/27/2022 2108   K 4.2 01/12/2017 0914   CL 96 (L) 08/27/2022 2108   CO2 24 08/27/2022 2108   CO2 28 01/12/2017 0914   GLUCOSE 84 08/27/2022 2108   GLUCOSE 142 (H) 01/12/2017 0914   BUN 16 08/27/2022 2108   BUN 19 04/05/2022 0942   BUN 12.0 01/12/2017 0914   CREATININE 1.26 (H) 08/27/2022 2108   CREATININE 1.58 (H) 10/12/2018 1121   CREATININE 1.3 (H) 01/12/2017 0914   CALCIUM  8.7 (L) 08/27/2022 2108   CALCIUM  9.6 01/12/2017 0914   GFRNONAA 47 (L) 08/27/2022 2108   GFRNONAA 34 (L) 10/12/2018 1121   GFRAA 44 (L) 03/02/2020 0920   GFRAA 40 (L) 10/12/2018 1121   Lab Results  Component Value Date   HGBA1C 7.9 (H) 04/16/2019   HGBA1C 7.3 09/04/2014   Lab Results  Component Value Date   INSULIN  12.3 04/16/2019   Lab Results  Component Value Date   TSH 1.200 04/05/2022   CBC    Component Value Date/Time   WBC  5.4 05/15/2023 1149   WBC 6.9 08/27/2022 2108   RBC 3.69 (L) 05/15/2023 1149   RBC 3.54 (L) 08/27/2022 2108   HGB 12.3 05/15/2023 1149   HGB 11.4 (L) 01/12/2017 0913   HCT 36.4 05/15/2023 1149   HCT 35.2 01/12/2017 0913   PLT 242 05/15/2023 1149   MCV 99 (H) 05/15/2023 1149   MCV 100.9 01/12/2017 0913   MCH 33.3 (H) 05/15/2023 1149   MCH 33.6 08/27/2022 2108   MCHC 33.8 05/15/2023 1149   MCHC 34.1 08/27/2022 2108   RDW 12.8 05/15/2023 1149   RDW 14.1 01/12/2017 0913   Iron Studies No results found for: IRON, TIBC, FERRITIN, IRONPCTSAT Lipid Panel     Component Value Date/Time   CHOL 155 04/16/2019 1451   TRIG 71  04/16/2019 1451   HDL 84 04/16/2019 1451   LDLCALC 57 04/16/2019 1451   Hepatic Function Panel     Component Value Date/Time   PROT 7.2 08/27/2022 2108   PROT 7.1 04/05/2022 0942   PROT 7.2 01/12/2017 0914   ALBUMIN 3.5 08/27/2022 2108   ALBUMIN 4.3 04/05/2022 0942   ALBUMIN 3.5 01/12/2017 0914   AST 18 08/27/2022 2108   AST 16 10/12/2018 1121   AST 19 01/12/2017 0914   ALT 5 08/27/2022 2108   ALT <6 10/12/2018 1121   ALT 18 01/12/2017 0914   ALKPHOS 47 08/27/2022 2108   ALKPHOS 43 01/12/2017 0914   BILITOT 0.4 08/27/2022 2108   BILITOT 0.3 04/05/2022 0942   BILITOT 0.2 (L) 10/12/2018 1121   BILITOT 0.28 01/12/2017 0914      Component Value Date/Time   TSH 1.200 04/05/2022 0942   Nutritional Lab Results  Component Value Date   VD25OH 44.8 12/17/2019   VD25OH 40.9 04/16/2019     Assessment and Plan Assessment & Plan Type 2 Diabetes Mellitus She is managing her type 2 diabetes with diet, exercise, weight loss, and Mounjaro . She is tolerating Mounjaro  well and has recently started a 15 mg dose. It will take four doses to achieve the full benefit of the medication. - Refill Mounjaro  prescription - Evaluate medication effectiveness at next visit  Obesity She is following a category two eating plan about 50% of the time and is attempting to exercise 45 minutes four times a week. Despite stress from family responsibilities, she has maintained her weight at 186 pounds. Emphasizes the importance of cooking at home and having healthy food available to avoid fast food. - Continue category two eating plan - Continue exercising 45 minutes four times a week - Encourage cooking at home to avoid fast food - Ensure availability of healthy food at home  Stress She is experiencing high levels of stress due to family responsibilities, including caring for her sisters. Despite the stress, she reports good sleep and support from her husband. - Continue exercise routine - Maintain  healthy eating habits  Parkinson's Disease She is managing her Parkinson's disease well and has reduced her medication dosage. Her neurologist has decreased her Levodopa  dosage, and she is participating in exercise classes for Parkinson's. Emphasizes the importance of using the lowest effective dose to maintain long-term efficacy, as higher doses may become ineffective over time. - Monitor response to reduced Levodopa  dosage - Continue Parkinson exercise classes   She was informed of the importance of frequent follow up visits to maximize her success with intensive lifestyle modifications for her multiple health conditions. Follow up in 4 weeks   Louann Penton, MD

## 2023-11-13 ENCOUNTER — Other Ambulatory Visit (HOSPITAL_BASED_OUTPATIENT_CLINIC_OR_DEPARTMENT_OTHER): Payer: Self-pay

## 2023-11-13 ENCOUNTER — Telehealth: Payer: Self-pay | Admitting: Neurology

## 2023-11-13 MED ORDER — DAPAGLIFLOZIN PROPANEDIOL 5 MG PO TABS
5.0000 mg | ORAL_TABLET | Freq: Every day | ORAL | 3 refills | Status: AC
  Filled 2023-11-13: qty 90, 90d supply, fill #0
  Filled 2024-02-06: qty 90, 90d supply, fill #1
  Filled 2024-05-06: qty 90, 90d supply, fill #2

## 2023-11-13 NOTE — Telephone Encounter (Signed)
 Pt reports that since she has been taken off of carbidopa -levodopa  (SINEMET  CR) 50-200 MG tablet she is very tired, unable to pick up her feet and has fallen several times, pt would like a call to discuss going back on this medication.

## 2023-11-14 MED ORDER — CARBIDOPA-LEVODOPA ER 50-200 MG PO TBCR: 1.0000 | EXTENDED_RELEASE_TABLET | Freq: Every day | ORAL | Status: DC

## 2023-11-14 NOTE — Telephone Encounter (Signed)
 I don't recommend she go back on high dose Sinemet  IR.  Does she have a walker she can use? Also, may increase Sinemet  CR to 1 pill 5 times a day, 8, 11, 2, 5, 8.

## 2023-11-14 NOTE — Telephone Encounter (Signed)
 I called pt. She relayed that since she has stopped the sinemet  IR 25-250mg  tabs she has had 2 falls (scraped knees) , more offbalance and shuffling of feet.  I asked if she had these symptoms when she had decreased to 1 tablet daily and she said no (just when she was off the medication).  She is taking the Sinemet  CR 50-200mg  1 qid (8-12-6-8).  She would like to go back on the IR 25-250 bid.

## 2023-11-14 NOTE — Addendum Note (Signed)
 Addended by: NEYSA NENA RAMAN on: 11/14/2023 04:31 PM   Modules accepted: Orders

## 2023-11-14 NOTE — Telephone Encounter (Signed)
 I LMVM for pt to call back about Dr. Obie recommendation. Pt returned call I relayed to her the recommendations per Dr Buck.  Pt has a walker and cane.  She took the times listed for the increase in the sinemt CR 8-11-2-5-8.  She verbalized understanding. And I sent her mychart message as well.   Did not recommend going back in high dose sinemet  IR.

## 2023-11-15 ENCOUNTER — Other Ambulatory Visit (HOSPITAL_BASED_OUTPATIENT_CLINIC_OR_DEPARTMENT_OTHER): Payer: Self-pay

## 2023-11-16 ENCOUNTER — Other Ambulatory Visit: Payer: Self-pay | Admitting: Pharmacy Technician

## 2023-11-16 ENCOUNTER — Other Ambulatory Visit: Payer: Self-pay

## 2023-11-16 NOTE — Progress Notes (Addendum)
 Specialty Pharmacy Refill Coordination Note  Deanna Rowe is a 68 y.o. female contacted today regarding refills of specialty medication(s) Omalizumab  (Xolair )   Patient requested Pickup at [facility] at [place] date: 11/28/23  Medication will be filled on 11/27/23.  Injection date on 11/30/23.   Patient called in to change PPU date to 8/26.

## 2023-11-17 ENCOUNTER — Other Ambulatory Visit: Payer: Self-pay

## 2023-11-20 ENCOUNTER — Other Ambulatory Visit (HOSPITAL_BASED_OUTPATIENT_CLINIC_OR_DEPARTMENT_OTHER): Payer: Self-pay

## 2023-11-23 ENCOUNTER — Ambulatory Visit: Admitting: Neurology

## 2023-11-24 ENCOUNTER — Other Ambulatory Visit (HOSPITAL_BASED_OUTPATIENT_CLINIC_OR_DEPARTMENT_OTHER): Payer: Self-pay

## 2023-11-27 ENCOUNTER — Other Ambulatory Visit: Payer: Self-pay

## 2023-11-27 ENCOUNTER — Other Ambulatory Visit (HOSPITAL_BASED_OUTPATIENT_CLINIC_OR_DEPARTMENT_OTHER): Payer: Self-pay

## 2023-11-28 ENCOUNTER — Encounter (INDEPENDENT_AMBULATORY_CARE_PROVIDER_SITE_OTHER): Payer: Self-pay | Admitting: Family Medicine

## 2023-11-28 ENCOUNTER — Ambulatory Visit (INDEPENDENT_AMBULATORY_CARE_PROVIDER_SITE_OTHER): Admitting: Family Medicine

## 2023-11-28 VITALS — BP 99/64 | HR 65 | Temp 97.5°F | Ht 66.0 in | Wt 185.0 lb

## 2023-11-28 DIAGNOSIS — E669 Obesity, unspecified: Secondary | ICD-10-CM | POA: Diagnosis not present

## 2023-11-28 DIAGNOSIS — E119 Type 2 diabetes mellitus without complications: Secondary | ICD-10-CM

## 2023-11-28 DIAGNOSIS — Z7985 Long-term (current) use of injectable non-insulin antidiabetic drugs: Secondary | ICD-10-CM

## 2023-11-28 DIAGNOSIS — Z6830 Body mass index (BMI) 30.0-30.9, adult: Secondary | ICD-10-CM

## 2023-11-28 DIAGNOSIS — Z6829 Body mass index (BMI) 29.0-29.9, adult: Secondary | ICD-10-CM | POA: Diagnosis not present

## 2023-11-28 DIAGNOSIS — E1169 Type 2 diabetes mellitus with other specified complication: Secondary | ICD-10-CM

## 2023-11-28 DIAGNOSIS — E66812 Obesity, class 2: Secondary | ICD-10-CM

## 2023-11-28 NOTE — Progress Notes (Signed)
 Office: [phone]  /  Fax: [phone]  WEIGHT SUMMARY AND BIOMETRICS  Anthropometric Measurements Height: 5' 6 (1.676 m) Weight: 185 lb (83.9 kg) BMI (Calculated): 29.87 Weight at Last Visit: 186 lb Weight Lost Since Last Visit: 1 lb Weight Gained Since Last Visit: 0 Starting Weight: 237 lb Total Weight Loss (lbs): 52 lb (23.6 kg) Peak Weight: 240 lb   Body Composition  Body Fat %: 38.9 % Fat Mass (lbs): 72 lbs Muscle Mass (lbs): 107.4 lbs Total Body Water (lbs): 71.2 lbs Visceral Fat Rating : 11   Other Clinical Data Fasting: yes Labs: no Today's Visit #: 11 Starting Date: 04/16/19    Chief Complaint: OBESITY   Discussed the use of AI scribe software for clinical note transcription with the patient, who gave verbal consent to proceed.  History of Present Illness Deanna Rowe is a 68 year old female with obesity and type two diabetes who presents for obesity treatment and progress assessment.  She follows a category two eating plan about fifty percent of the time and engages in physical activity, including Parkinson's group exercises and swimming. She has lost one pound in the last month.  In managing type two diabetes, she primarily relies on diet, exercise, education, and weight loss, supplemented by Mounjaro . Her most recent hemoglobin A1c, performed in December 2024, was 6.0. No negative effects from Mounjaro  are reported.  She experiences increased hunger and cravings, particularly for sweets, which she attributes to 'a couple bad habits.' She has not yet tried a low-calorie, protein-rich dessert recipe that might help manage these cravings.  She is planning a vacation to [place], where she typically has breakfast in her condo and eats dinner out. She typically has breakfast in her condo and eats dinner out, while also engaging in a lot of walking during her trips.      PHYSICAL EXAM:  Blood pressure 99/64, pulse 65, temperature (!) 97.5 F (36.4  C), height 5' 6 (1.676 m), weight 185 lb (83.9 kg), SpO2 98%. Body mass index is 29.86 kg/m.  DIAGNOSTIC DATA REVIEWED:  BMET    Component Value Date/Time   NA 128 (L) 08/27/2022 2108   NA 138 04/05/2022 0942   NA 142 01/12/2017 0914   K 3.9 08/27/2022 2108   K 4.2 01/12/2017 0914   CL 96 (L) 08/27/2022 2108   CO2 24 08/27/2022 2108   CO2 28 01/12/2017 0914   GLUCOSE 84 08/27/2022 2108   GLUCOSE 142 (H) 01/12/2017 0914   BUN 16 08/27/2022 2108   BUN 19 04/05/2022 0942   BUN 12.0 01/12/2017 0914   CREATININE 1.26 (H) 08/27/2022 2108   CREATININE 1.58 (H) 10/12/2018 1121   CREATININE 1.3 (H) 01/12/2017 0914   CALCIUM  8.7 (L) 08/27/2022 2108   CALCIUM  9.6 01/12/2017 0914   GFRNONAA 47 (L) 08/27/2022 2108   GFRNONAA 34 (L) 10/12/2018 1121   GFRAA 44 (L) 03/02/2020 0920   GFRAA 40 (L) 10/12/2018 1121   Lab Results  Component Value Date   HGBA1C 7.9 (H) 04/16/2019   HGBA1C 7.3 09/04/2014   Lab Results  Component Value Date   INSULIN  12.3 04/16/2019   Lab Results  Component Value Date   TSH 1.200 04/05/2022   CBC    Component Value Date/Time   WBC 5.4 05/15/2023 1149   WBC 6.9 08/27/2022 2108   RBC 3.69 (L) 05/15/2023 1149   RBC 3.54 (L) 08/27/2022 2108   HGB 12.3 05/15/2023 1149   HGB 11.4 (L) 01/12/2017 0913  HCT 36.4 05/15/2023 1149   HCT 35.2 01/12/2017 0913   PLT 242 05/15/2023 1149   MCV 99 (H) 05/15/2023 1149   MCV 100.9 01/12/2017 0913   MCH 33.3 (H) 05/15/2023 1149   MCH 33.6 08/27/2022 2108   MCHC 33.8 05/15/2023 1149   MCHC 34.1 08/27/2022 2108   RDW 12.8 05/15/2023 1149   RDW 14.1 01/12/2017 0913   Iron Studies No results found for: IRON, TIBC, FERRITIN, IRONPCTSAT Lipid Panel     Component Value Date/Time   CHOL 155 04/16/2019 1451   TRIG 71 04/16/2019 1451   HDL 84 04/16/2019 1451   LDLCALC 57 04/16/2019 1451   Hepatic Function Panel     Component Value Date/Time   PROT 7.2 08/27/2022 2108   PROT 7.1 04/05/2022 0942    PROT 7.2 01/12/2017 0914   ALBUMIN 3.5 08/27/2022 2108   ALBUMIN 4.3 04/05/2022 0942   ALBUMIN 3.5 01/12/2017 0914   AST 18 08/27/2022 2108   AST 16 10/12/2018 1121   AST 19 01/12/2017 0914   ALT 5 08/27/2022 2108   ALT <6 10/12/2018 1121   ALT 18 01/12/2017 0914   ALKPHOS 47 08/27/2022 2108   ALKPHOS 43 01/12/2017 0914   BILITOT 0.4 08/27/2022 2108   BILITOT 0.3 04/05/2022 0942   BILITOT 0.2 (L) 10/12/2018 1121   BILITOT 0.28 01/12/2017 0914      Component Value Date/Time   TSH 1.200 04/05/2022 0942   Nutritional Lab Results  Component Value Date   VD25OH 44.8 12/17/2019   VD25OH 40.9 04/16/2019     Assessment and Plan Assessment & Plan Obesity Obesity management is ongoing with a focus on diet and exercise. She is following the category two eating plan about 50% of the time and is engaging in physical activities such as Parkinson's group exercises and swimming twice a week. She has lost one pound in the last month, attributed to fat loss. Hunger levels have increased, leading to cravings, particularly for sweets. Strategies to manage cravings include making low-calorie, protein-rich desserts. The importance of maintaining muscle mass through exercise is emphasized, and current activities are positively impacting muscle mass. - Encourage adherence to the category two eating plan. - Promote regular physical activity, including Parkinson's group exercises and swimming. - Advise making low-calorie, protein-rich desserts to manage cravings.  Type 2 diabetes mellitus Type 2 diabetes is being managed with diet, exercise, education, weight loss, and Mounjaro . The most recent hemoglobin A1c was 6.0 in December 2024. She reports no negative effects from Mounjaro . - Continue Mounjaro  as prescribed, will continue to monitor and manage this medication - Reinforce dietary and exercise recommendations.      She was informed of the importance of frequent follow up visits to maximize her  success with intensive lifestyle modifications for her multiple health conditions.    Louann Penton, MD

## 2023-12-07 DIAGNOSIS — K1370 Unspecified lesions of oral mucosa: Secondary | ICD-10-CM | POA: Diagnosis not present

## 2023-12-08 ENCOUNTER — Other Ambulatory Visit (HOSPITAL_BASED_OUTPATIENT_CLINIC_OR_DEPARTMENT_OTHER): Payer: Self-pay

## 2023-12-08 DIAGNOSIS — K1329 Other disturbances of oral epithelium, including tongue: Secondary | ICD-10-CM | POA: Diagnosis not present

## 2023-12-11 ENCOUNTER — Other Ambulatory Visit (HOSPITAL_BASED_OUTPATIENT_CLINIC_OR_DEPARTMENT_OTHER): Payer: Self-pay

## 2023-12-12 ENCOUNTER — Other Ambulatory Visit (HOSPITAL_BASED_OUTPATIENT_CLINIC_OR_DEPARTMENT_OTHER): Payer: Self-pay

## 2023-12-12 ENCOUNTER — Other Ambulatory Visit: Payer: Self-pay

## 2023-12-12 MED ORDER — AMLODIPINE BESYLATE 5 MG PO TABS
5.0000 mg | ORAL_TABLET | Freq: Every day | ORAL | 0 refills | Status: DC
  Filled 2023-12-12 – 2023-12-27 (×2): qty 90, 90d supply, fill #0

## 2023-12-14 ENCOUNTER — Other Ambulatory Visit (HOSPITAL_BASED_OUTPATIENT_CLINIC_OR_DEPARTMENT_OTHER): Payer: Self-pay

## 2023-12-15 ENCOUNTER — Other Ambulatory Visit (HOSPITAL_BASED_OUTPATIENT_CLINIC_OR_DEPARTMENT_OTHER): Payer: Self-pay

## 2023-12-15 MED ORDER — FLUZONE HIGH-DOSE 0.5 ML IM SUSY
0.5000 mL | PREFILLED_SYRINGE | Freq: Once | INTRAMUSCULAR | 0 refills | Status: AC
  Filled 2023-12-15: qty 0.5, 1d supply, fill #0

## 2023-12-17 ENCOUNTER — Emergency Department (HOSPITAL_BASED_OUTPATIENT_CLINIC_OR_DEPARTMENT_OTHER)

## 2023-12-17 ENCOUNTER — Encounter (HOSPITAL_BASED_OUTPATIENT_CLINIC_OR_DEPARTMENT_OTHER): Payer: Self-pay | Admitting: Emergency Medicine

## 2023-12-17 ENCOUNTER — Other Ambulatory Visit: Payer: Self-pay

## 2023-12-17 ENCOUNTER — Emergency Department (HOSPITAL_BASED_OUTPATIENT_CLINIC_OR_DEPARTMENT_OTHER)
Admission: EM | Admit: 2023-12-17 | Discharge: 2023-12-17 | Disposition: A | Discharge status: Home / Self Care | Attending: Emergency Medicine | Admitting: Emergency Medicine

## 2023-12-17 DIAGNOSIS — Z471 Aftercare following joint replacement surgery: Secondary | ICD-10-CM | POA: Diagnosis not present

## 2023-12-17 DIAGNOSIS — G20A1 Parkinson's disease without dyskinesia, without mention of fluctuations: Secondary | ICD-10-CM | POA: Diagnosis not present

## 2023-12-17 DIAGNOSIS — W19XXXA Unspecified fall, initial encounter: Secondary | ICD-10-CM

## 2023-12-17 DIAGNOSIS — S0990XA Unspecified injury of head, initial encounter: Secondary | ICD-10-CM | POA: Diagnosis not present

## 2023-12-17 DIAGNOSIS — Z96642 Presence of left artificial hip joint: Secondary | ICD-10-CM | POA: Diagnosis not present

## 2023-12-17 DIAGNOSIS — M25552 Pain in left hip: Secondary | ICD-10-CM | POA: Insufficient documentation

## 2023-12-17 DIAGNOSIS — Z7982 Long term (current) use of aspirin: Secondary | ICD-10-CM | POA: Insufficient documentation

## 2023-12-17 DIAGNOSIS — W109XXA Fall (on) (from) unspecified stairs and steps, initial encounter: Secondary | ICD-10-CM | POA: Insufficient documentation

## 2023-12-17 NOTE — Discharge Instructions (Signed)
 The CT of your head did not show any acute finding.  The x-ray of your left hip did not show any dislocation or fracture.  You may take Tylenol  to help with your symptoms.  Return to the emergency department if your symptoms worsen.

## 2023-12-17 NOTE — ED Provider Notes (Signed)
 [place] [facility] AT [facility] Provider Note   CSN: [id number] Arrival date & time: 12/17/23  1233     Patient presents with: Fall   Deanna Rowe is a 68 y.o. female.   68 year old female with a past medical history of Parkinson's presents to the ED status post mechanical fall.  Patient reports she fell approximately 11 steps when she was trying to reach for her cane.  She reports landing on the left side of her head did strike her head but is not complaining of any headache at this time.  She is currently not on any blood thinners but does take a baby aspirin .  She endorses worsening pain to her left hip, she does have prior history of intervention to her left hip.  She has taken some Tylenol  prior to arrival in the emergency department.  The history is provided by the patient.  Fall Pertinent negatives include no chest pain and no shortness of breath.       Prior to Admission medications   Medication Sig Start Date End Date Taking? Authorizing Provider  ACCU-CHEK FASTCLIX LANCETS MISC USE TO CHECK BLOOD SUGAR 3 TIMES A DAY 02/27/18   Kassie Mallick, MD  Accu-Chek FastClix Lancets MISC Use to check blood sugar 3 times daily. 08/10/23   Faythe Purchase, MD  ACCU-CHEK GUIDE test strip USE TO CHECK BLOOD SUGAR 3 TIMES PER DAY. Patient taking differently: Use to check blood sugar 4 times per day. 02/02/17   Kassie Mallick, MD  albuterol  (VENTOLIN  HFA) 108 [phone] Base) MCG/ACT inhaler Inhale 2 puffs into the lungs as needed for wheezing or shortness of breath. 07/06/20   Cari Arlean HERO, FNP  amLODipine  (NORVASC ) 5 MG tablet Take 5 mg by mouth daily.    [provider]  amLODipine  (NORVASC ) 5 MG tablet Take 1 tablet (5 mg total) by mouth daily. 12/12/23     aspirin  EC 81 MG tablet Take 81 mg by mouth every evening.    [provider]  atorvastatin  (LIPITOR) 10 MG tablet Take 1 tablet (10 mg total) by mouth at bedtime. 08/03/23   Dick, Ernest H Jr., NP   azelastine  (ASTELIN ) 0.1 % nasal spray Place 2 sprays into both nostrils 2 (two) times daily as needed. 11/25/21   Cheryl Reusing, FNP  Blood Glucose Monitoring Suppl (FREESTYLE LITE) w/Device KIT Use to check blood sugar three times daily. 08/10/23   Faythe Purchase, MD  brexpiprazole  (REXULTI ) 2 MG TABS tablet Take 1 tablet (2 mg total) by mouth in the morning. 10/18/23     buPROPion  (WELLBUTRIN  XL) 150 MG 24 hr tablet Take 3 tablets (450 mg total) by mouth in the morning. 10/18/23     Calcium  Carbonate-Vitamin D  600-400 MG-UNIT tablet Take 1 tablet by mouth daily.    [provider]  carbidopa -levodopa  (SINEMET  CR) 50-200 MG tablet Take 1 tablet by mouth 5 (five) times daily. 11/14/23   Buck Saucer, MD  Continuous Blood Gluc Sensor (DEXCOM G7 SENSOR) MISC Use to check blood sugar and change sensor every 10 days 11/25/21     dapagliflozin  propanediol (FARXIGA ) 5 MG TABS tablet Take 1 tablet by mouth once daily. 11/13/23     Dapagliflozin -sAXagliptin 5-5 MG TABS Take 5 mg by mouth daily at 6 (six) AM.    [provider]  EPINEPHrine  (EPIPEN  2-PAK) 0.3 mg/0.3 mL IJ SOAJ injection Inject 0.3 mg into the muscle as needed for anaphylaxis. 07/25/23   Lorin Norris, MD  gabapentin  (NEURONTIN )  300 MG capsule Take 1 capsule (300 mg total) by mouth 3 (three) times daily. 08/08/23     glucose blood test strip Use to check blood sugar 4 times daily 09/30/21     LORazepam  (ATIVAN ) 2 MG tablet Take 1 tablet (2 mg total) by mouth 3 (three) times daily. 10/18/23     losartan  (COZAAR ) 50 MG tablet Take 1 tablet (50 mg total) by mouth daily. 08/03/23   Dick, Ernest H Jr., NP  Olopatadine  HCl (PATADAY ) 0.2 % SOLN Place 1 drop into both eyes daily as needed. 07/06/20   Cari Arlean HERO, FNP  omalizumab  (XOLAIR ) 300 MG/2  ML prefilled syringe Inject 300 mg into the skin every 28 (twenty-eight) days. 07/04/23   Jegede, Olugbemiga E, MD  oxybutynin  (DITROPAN -XL) 10 MG 24 hr tablet Take 1 tablet (10 mg total) by mouth  daily. 02/02/23     propranolol  (INDERAL ) 40 MG tablet Take 1 tablet (40 mg total) by mouth 2 (two) times daily. 08/03/23   Wyn Jackee VEAR Mickey., NP  rOPINIRole  (REQUIP ) 2 MG tablet Take 1 tablet (2 mg total) by mouth 2 (two) times daily. 03/22/23   Jess Roby Masker, MD  rOPINIRole  (REQUIP ) 2 MG tablet Take 1 tablet (2 mg total) by mouth 2 (two) times daily. 10/18/23     tirzepatide  (MOUNJARO ) 15 MG/0.5ML Pen Inject 15 mg into the skin once a week. 10/30/23   Verdon Parry D, MD  triamcinolone  (NASACORT ) 55 MCG/ACT AERO nasal inhaler Place 2 sprays into the nose daily. 07/06/20   Cari Arlean HERO, FNP  valACYclovir  (VALTREX ) 500 MG tablet Take 1 tablet (500 mg total) by mouth daily. 10/18/22     vitamin C (ASCORBIC ACID) 500 MG tablet Take 500 mg by mouth daily.    [provider]  Vitamin D , Ergocalciferol , 2000 units CAPS Take 1 capsule by mouth daily. 2000 units daily.    [provider]  vortioxetine  HBr (TRINTELLIX ) 20 MG TABS tablet Take 1 tablet (20 mg total) by mouth in the morning. 10/18/23     zolpidem  (AMBIEN ) 5 MG tablet Take 1 tablet (5 mg total) by mouth at bedtime. 10/18/23       Allergies: Fluticasone -salmeterol, Codeine, Fetzima [levomilnacipran], Nsaids, Trulicity [dulaglutide], Xanax [alprazolam], Amoxicillin, and Pseudoephedrine  hcl er    Review of Systems  Constitutional:  Negative for chills and fever.  Respiratory:  Negative for shortness of breath.   Cardiovascular:  Negative for chest pain.  Musculoskeletal:  Positive for arthralgias.  All other systems reviewed and are negative.   Updated Vital Signs BP 115/79 (BP Location: Left Arm)   Pulse 72   Temp 98 F (36.7 C) (Oral)   Resp 18   Ht 5' 6 (1.676 m)   Wt 83.9 kg   SpO2 97%   BMI 29.86 kg/m   Physical Exam Vitals and nursing note reviewed.  Constitutional:      Appearance: Normal appearance.  HENT:     Head: Normocephalic.     Comments: No palp deformity, no hematoma noted.     Mouth/Throat:     Mouth: Mucous membranes are moist.  Neck:     Comments: No cervical spine tenderness, has full range of motion of her neck. Cardiovascular:     Rate and Rhythm: Normal rate.  Pulmonary:     Effort: Pulmonary effort is normal.     Breath sounds: No wheezing.     Comments: Lungs are clear to auscultation. Abdominal:     General: Abdomen  is flat.     Palpations: Abdomen is soft.  Musculoskeletal:     Cervical back: Normal range of motion and neck supple.     Left hip: Tenderness present. No crepitus. Normal strength.     Comments: Tenderness to palpation along the left hip full range of motion without any weakness noted.  No notable bruising or hematoma.  Skin:    General: Skin is warm and dry.  Neurological:     Mental Status: She is alert and oriented to person, place, and time.     Comments: Alert, oriented, thought content appropriate. Speech fluent without evidence of aphasia. Able to follow 2 step commands without difficulty.  Cranial Nerves:  II:  Peripheral visual fields grossly normal, pupils, round, reactive to light III,IV, VI: ptosis not present, extra-ocular motions intact bilaterally  V,VII: smile symmetric, facial light touch sensation equal VIII: hearing grossly normal bilaterally  IX,X: midline uvula rise  XI: bilateral shoulder shrug equal and strong XII: midline tongue extension  Motor:  5/5 in upper and lower extremities bilaterally including strong and equal grip strength and dorsiflexion/plantar flexion Sensory: light touch normal in all extremities.  Cerebellar: normal finger-to-nose with bilateral upper extremities, pronator drift negative Gait: n/a       (all labs ordered are listed, but only abnormal results are displayed) Labs Reviewed - No data to display  EKG: None  Radiology: DG Hip Unilat W or Wo Pelvis 2-3 Views Left Result Date: 12/17/2023 EXAM: 2 or more VIEW(S) XRAY OF THE LT HIP 12/17/2023 01:28:26 PM COMPARISON: CT  08/27/2022 CLINICAL HISTORY: Fall. Pt sts she fell down 11 steps just PTA; sts she was using a cane to go up the steps and missed a step; denies LOC, but did hit head; also c/o LT hip pain (hx of LT hip replacement). FINDINGS: BONES AND JOINTS: Status post left total hip arthroplasty with components in good position and near anatomic alignment. Status post right hip arthroplasty with distal aspect not visualized. 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 SOFT TISSUES: The soft tissues are unremarkable. IMPRESSION: 1. No acute fracture or dislocation. 2. Status post left total hip arthroplasty with components in good position and near anatomic alignment. 3. Status post right hip arthroplasty with distal aspect not visualized. 4. Heterotopic ossification about both hips. Electronically signed by: Waddell Calk MD 12/17/2023 01:59 PM EDT RP Workstation: [id number]   CT Head Wo Contrast Result Date: 12/17/2023 EXAM: CT HEAD WITHOUT CONTRAST 12/17/2023 01:26:27 PM TECHNIQUE: CT of the head was performed without the administration of intravenous contrast. Automated exposure control, iterative reconstruction, and/or weight based adjustment of the mA/kV was utilized to reduce the radiation dose to as low as reasonably achievable. COMPARISON: None available. CLINICAL HISTORY: Head trauma, moderate-severe. Pt sts she fell down 11 steps just PTA; sts she was using a cane to go up the steps and missed a step; denies LOC, but did hit head. FINDINGS: BRAIN AND VENTRICLES: No acute hemorrhage. No evidence of acute infarct. No hydrocephalus. No extra-axial collection. No mass effect or midline shift. Hypoattenuating foci in the cerebral white matter, most likely representing chronic small vessel disease. ORBITS: No acute abnormality. SINUSES: No acute abnormality. SOFT TISSUES AND SKULL: No acute soft tissue abnormality. No skull fracture. IMPRESSION: 1. No acute intracranial abnormality. 2. Hypoattenuating foci in the  cerebral white matter, most likely representing chronic small vessel disease. Electronically signed by: Waddell Calk MD 12/17/2023 01:54 PM EDT RP Workstation: [id number]     Procedures  Medications Ordered in the ED - No data to display                                  Medical Decision Making Amount and/or Complexity of Data Reviewed Radiology: ordered.    Patient present to the ED with a chief complaint of left hip pain status post mechanical fall.  Patient reports he was going up 11 steps when suddenly she missed a step and fell forward landing on her left hip, she did strike her head.  She reports she did not lose consciousness.  She is currently not on any blood thinners.  She reports she was able to stand up after the fall.  She does have concerns that she had a prior history of a left hip replacement therefore she is concerned for any movement of the prosthesis.  Neurological exam is unremarkable, her vitals are within normal limits.  She denies any pain along her neck, no CT scan was obtained of her cervical spine she does not have any midline tenderness.  CT head without any acute finding.  X-ray of her left hip does not show any dislocation versus fracture.  She reports improvement in pain after taking some Tylenol .  She reports that she is unable to take any other medication for supportive care.  Discussed further follow-up with PCP if symptoms not improved.  Patient is hemodynamically stable for discharge.   Portions of this note were generated with Scientist, clinical (histocompatibility and immunogenetics). Dictation errors may occur despite best attempts at proofreading.  Final diagnoses:  Fall, initial encounter  Left hip pain    ED Discharge Orders     None          Maureen Broad, PA-C 12/17/23 1437    Cottie Donnice PARAS, MD 12/17/23 1500

## 2023-12-17 NOTE — ED Triage Notes (Signed)
 Pt sts she fell down 11 steps just PTA; sts she was using a cane to go up the steps and missed a step; denies LOC, but did hit head; also c/o LT hip pain (hx of LT hip replacement); takes baby ASA

## 2023-12-18 ENCOUNTER — Telehealth: Payer: Self-pay | Admitting: Neurology

## 2023-12-18 DIAGNOSIS — T43505A Adverse effect of unspecified antipsychotics and neuroleptics, initial encounter: Secondary | ICD-10-CM

## 2023-12-18 DIAGNOSIS — G20C Parkinsonism, unspecified: Secondary | ICD-10-CM

## 2023-12-18 DIAGNOSIS — R296 Repeated falls: Secondary | ICD-10-CM

## 2023-12-18 NOTE — Telephone Encounter (Signed)
 I called pt and she said that she had a fall (see ED note 12/17/2023).  She said that she is not walking as well since her last visit and had med change to CR.  She had fall down stair hit her head, hurt her L hip.  Was seen at ED and nothing broken thankfully.  She was doing well for several months prior to her last visit. 10/2023.  She called after med change 11-13-2023 and wanted to go back on IR.  I relayed to her per Dr. Buck her recommendations and she increased the CR dosing to 8-11-2-5-8.  She has cane and rollator walker.  She is asking if Dr. Buck had any other recommendations to assist with her mobility.  Her husband said something about PT appt on the 12-29-2023, next door, but I did not see this.  So they will call and check on this.  I told her as well that Dr. Buck was out of the office and could address tomorrow.  She appreciated the call back.

## 2023-12-18 NOTE — Telephone Encounter (Signed)
 Pt calling to report she is falling all over the place since taken off 2 meds, she is asking for a call to discuss this concern.

## 2023-12-19 ENCOUNTER — Other Ambulatory Visit (HOSPITAL_BASED_OUTPATIENT_CLINIC_OR_DEPARTMENT_OTHER): Payer: Self-pay

## 2023-12-19 ENCOUNTER — Other Ambulatory Visit: Payer: Self-pay | Admitting: Neurology

## 2023-12-19 ENCOUNTER — Other Ambulatory Visit (HOSPITAL_COMMUNITY): Payer: Self-pay

## 2023-12-19 MED ORDER — CARBIDOPA-LEVODOPA ER 50-200 MG PO TBCR
1.0000 | EXTENDED_RELEASE_TABLET | Freq: Every day | ORAL | 3 refills | Status: DC
  Filled 2023-12-19: qty 450, 90d supply, fill #0
  Filled 2024-04-03: qty 450, 90d supply, fill #1

## 2023-12-19 NOTE — Telephone Encounter (Signed)
 I called pt and LMVM for her that Dr. Buck did want her to continue using her walker at all times.  She did send a PR referral to neuro-rehab next door as well.  She will call back if questions.

## 2023-12-19 NOTE — Telephone Encounter (Signed)
 I recommend that she use her walker at all times.  I have made  a referral to neurorehab, PT.

## 2023-12-19 NOTE — Addendum Note (Signed)
 Addended by: Klynn Linnemann on: 12/19/2023 09:51 AM   Modules accepted: Orders

## 2023-12-19 NOTE — Telephone Encounter (Signed)
 Spoke to pt / husband and I told them I would refill the sinemet  CR 50-200 take 1 tablet  5 times daily #450 refill x 3.  Done. Also relayed using walker all the time, and new referral order for PT placed to neuro rehab.  They will call and check on this.

## 2023-12-21 ENCOUNTER — Other Ambulatory Visit (HOSPITAL_BASED_OUTPATIENT_CLINIC_OR_DEPARTMENT_OTHER): Payer: Self-pay

## 2023-12-22 ENCOUNTER — Other Ambulatory Visit (HOSPITAL_BASED_OUTPATIENT_CLINIC_OR_DEPARTMENT_OTHER): Payer: Self-pay

## 2023-12-24 ENCOUNTER — Other Ambulatory Visit (HOSPITAL_BASED_OUTPATIENT_CLINIC_OR_DEPARTMENT_OTHER): Payer: Self-pay

## 2023-12-24 MED ORDER — VALACYCLOVIR HCL 500 MG PO TABS
500.0000 mg | ORAL_TABLET | Freq: Every day | ORAL | 4 refills | Status: AC
  Filled 2023-12-24: qty 90, 90d supply, fill #0
  Filled 2024-03-12 (×2): qty 90, 90d supply, fill #1

## 2023-12-25 ENCOUNTER — Other Ambulatory Visit: Payer: Self-pay

## 2023-12-25 ENCOUNTER — Encounter (INDEPENDENT_AMBULATORY_CARE_PROVIDER_SITE_OTHER): Payer: Self-pay | Admitting: Family Medicine

## 2023-12-25 ENCOUNTER — Other Ambulatory Visit (HOSPITAL_BASED_OUTPATIENT_CLINIC_OR_DEPARTMENT_OTHER): Payer: Self-pay

## 2023-12-25 ENCOUNTER — Ambulatory Visit (INDEPENDENT_AMBULATORY_CARE_PROVIDER_SITE_OTHER): Admitting: Family Medicine

## 2023-12-25 VITALS — BP 100/68 | HR 69 | Temp 97.7°F | Ht 66.0 in | Wt 185.0 lb

## 2023-12-25 DIAGNOSIS — K5900 Constipation, unspecified: Secondary | ICD-10-CM | POA: Diagnosis not present

## 2023-12-25 DIAGNOSIS — Z7985 Long-term (current) use of injectable non-insulin antidiabetic drugs: Secondary | ICD-10-CM

## 2023-12-25 DIAGNOSIS — E119 Type 2 diabetes mellitus without complications: Secondary | ICD-10-CM

## 2023-12-25 DIAGNOSIS — E669 Obesity, unspecified: Secondary | ICD-10-CM | POA: Diagnosis not present

## 2023-12-25 DIAGNOSIS — Z6829 Body mass index (BMI) 29.0-29.9, adult: Secondary | ICD-10-CM | POA: Diagnosis not present

## 2023-12-25 DIAGNOSIS — Z794 Long term (current) use of insulin: Secondary | ICD-10-CM

## 2023-12-25 DIAGNOSIS — K5901 Slow transit constipation: Secondary | ICD-10-CM

## 2023-12-25 MED ORDER — TIRZEPATIDE 15 MG/0.5ML ~~LOC~~ SOAJ
15.0000 mg | SUBCUTANEOUS | 0 refills | Status: DC
  Filled 2023-12-25: qty 6, 84d supply, fill #0

## 2023-12-25 NOTE — Progress Notes (Signed)
 Specialty Pharmacy Refill Coordination Note  Deanna Rowe is a 68 y.o. female contacted today regarding refills of specialty medication(s) Omalizumab  (Xolair )   Patient requested Pickup at [facility] [place] date: 12/25/23   Medication will be filled on 12/25/23.

## 2023-12-25 NOTE — Progress Notes (Signed)
 Office: [phone]  /  Fax: [phone]  WEIGHT SUMMARY AND BIOMETRICS  Anthropometric Measurements Height: 5' 6 (1.676 m) Weight: 185 lb (83.9 kg) BMI (Calculated): 29.87 Weight at Last Visit: 185 lb Weight Lost Since Last Visit: 0 Weight Gained Since Last Visit: 0 Starting Weight: 237 lb Total Weight Loss (lbs): 52 lb (23.6 kg) Peak Weight: 240 lb   Body Composition  Body Fat %: 39.8 % Fat Mass (lbs): 73.6 lbs Muscle Mass (lbs): 105.8 lbs Total Body Water (lbs): 72.2 lbs Visceral Fat Rating : 11   Other Clinical Data Fasting: no Labs: no Today's Visit #: 61 Starting Date: 04/16/19    Chief Complaint: OBESITY    History of Present Illness Deanna Rowe is a 68 year old female with obesity and type 2 diabetes who presents for obesity treatment and progress assessment.  She adheres to the category two eating plan about fifty percent of the time and exercises four days a week for forty-five minutes, including swimming and participating in a Parkinson's exercise group. She has lost 52 pounds and maintained her weight over the last month.  She has type 2 diabetes and is taking Mounjaro  15 mg, saxagliptin/dapagliflozin  combination and  dapagliflozen separately. She is working on controlling her blood sugars with diet and exercise.  She experienced a fall down the stairs a week ago, resulting in a fear of stairs and pain in her left hip, though no fractures were found. She is taking precautions such as not carrying items while using the stairs and holding onto the stair rails.  She reports constipation, describing her bowel movements as less frequent and painful.  She is planning a trip to [place], [place] , which will involve a lot of walking and some rest periods. She intends to maintain her exercise routine and manage her meals during the trip.      PHYSICAL EXAM:  Blood pressure 100/68, pulse 69, temperature 97.7 F (36.5 C), height 5' 6 (1.676 m),  weight 185 lb (83.9 kg), SpO2 99%. Body mass index is 29.86 kg/m.  DIAGNOSTIC DATA REVIEWED:  BMET    Component Value Date/Time   NA 128 (L) 08/27/2022 2108   NA 138 04/05/2022 0942   NA 142 01/12/2017 0914   K 3.9 08/27/2022 2108   K 4.2 01/12/2017 0914   CL 96 (L) 08/27/2022 2108   CO2 24 08/27/2022 2108   CO2 28 01/12/2017 0914   GLUCOSE 84 08/27/2022 2108   GLUCOSE 142 (H) 01/12/2017 0914   BUN 16 08/27/2022 2108   BUN 19 04/05/2022 0942   BUN 12.0 01/12/2017 0914   CREATININE 1.26 (H) 08/27/2022 2108   CREATININE 1.58 (H) 10/12/2018 1121   CREATININE 1.3 (H) 01/12/2017 0914   CALCIUM  8.7 (L) 08/27/2022 2108   CALCIUM  9.6 01/12/2017 0914   GFRNONAA 47 (L) 08/27/2022 2108   GFRNONAA 34 (L) 10/12/2018 1121   GFRAA 44 (L) 03/02/2020 0920   GFRAA 40 (L) 10/12/2018 1121   Lab Results  Component Value Date   HGBA1C 7.9 (H) 04/16/2019   HGBA1C 7.3 09/04/2014   Lab Results  Component Value Date   INSULIN  12.3 04/16/2019   Lab Results  Component Value Date   TSH 1.200 04/05/2022   CBC    Component Value Date/Time   WBC 5.4 05/15/2023 1149   WBC 6.9 08/27/2022 2108   RBC 3.69 (L) 05/15/2023 1149   RBC 3.54 (L) 08/27/2022 2108   HGB 12.3 05/15/2023 1149   HGB 11.4 (L) 01/12/2017  0913   HCT 36.4 05/15/2023 1149   HCT 35.2 01/12/2017 0913   PLT 242 05/15/2023 1149   MCV 99 (H) 05/15/2023 1149   MCV 100.9 01/12/2017 0913   MCH 33.3 (H) 05/15/2023 1149   MCH 33.6 08/27/2022 2108   MCHC 33.8 05/15/2023 1149   MCHC 34.1 08/27/2022 2108   RDW 12.8 05/15/2023 1149   RDW 14.1 01/12/2017 0913   Iron Studies No results found for: IRON, TIBC, FERRITIN, IRONPCTSAT Lipid Panel     Component Value Date/Time   CHOL 155 04/16/2019 1451   TRIG 71 04/16/2019 1451   HDL 84 04/16/2019 1451   LDLCALC 57 04/16/2019 1451   Hepatic Function Panel     Component Value Date/Time   PROT 7.2 08/27/2022 2108   PROT 7.1 04/05/2022 0942   PROT 7.2 01/12/2017 0914    ALBUMIN 3.5 08/27/2022 2108   ALBUMIN 4.3 04/05/2022 0942   ALBUMIN 3.5 01/12/2017 0914   AST 18 08/27/2022 2108   AST 16 10/12/2018 1121   AST 19 01/12/2017 0914   ALT 5 08/27/2022 2108   ALT <6 10/12/2018 1121   ALT 18 01/12/2017 0914   ALKPHOS 47 08/27/2022 2108   ALKPHOS 43 01/12/2017 0914   BILITOT 0.4 08/27/2022 2108   BILITOT 0.3 04/05/2022 0942   BILITOT 0.2 (L) 10/12/2018 1121   BILITOT 0.28 01/12/2017 0914      Component Value Date/Time   TSH 1.200 04/05/2022 0942   Nutritional Lab Results  Component Value Date   VD25OH 44.8 12/17/2019   VD25OH 40.9 04/16/2019     Assessment and Plan Assessment & Plan Obesity and weight management Significant weight loss of 52 pounds achieved. BMI is 29.9, body fat percentage is 39.8%, with a goal to reduce to 38% while maintaining muscle mass. Visceral fat is at 11, below the target of 12 for postmenopausal women. Following the category two eating plan 50% of the time. Protein intake needs improvement, especially at dinner. Mounjaro  discussed for long-term use due to benefits for cardiac and kidney health. - Continue current exercise regimen, including swimming and Parkinson's exercise group. - Encourage adherence to the category two eating plan. - Educate on protein intake strategies, including lean meat substitutes and equivalents. - Monitor body composition, focusing on reducing body fat percentage to 38% while maintaining muscle mass. - Discuss potential long-term use of Mounjaro  for weight management and its benefits for cardiac and kidney health.  Type 2 diabetes mellitus Well controlled with diet, exercise, and medications including Mounjaro  15 mg, saxagliptin, and dapagliflozin . Blood sugars stable, but potential medication duplication with Farxiga  and saxagliptin/dapagliflozin . Experiencing low blood sugar alarms at night, possibly due to medication issues. Mounjaro  beneficial for controlling blood sugar and preventing  inappropriate liver glucose release. - Clarify medication list to ensure no duplication of Farxiga  and saxagliptin/dapagliflozin . - Check medication bottles at home and consult with the prescribing provider if necessary. - Consider adjusting the alarm settings on the glucose monitor to prevent unnecessary alarms for artificially low readings. - Encourage a bedtime snack to prevent nocturnal hypoglycemia, light greek yogurt is an especially good option - Continue Mounjaro  for diabetes management and its additional benefits.  Constipation Characterized by infrequent and painful bowel movements, likely due to slowed GI transit from Mounjaro . Colon pulling too much water from stool, leading to hard stools. Miralax  used inconsistently, not effective for prevention. - Take Miralax  daily to prevent constipation, rather than as needed. - Avoid stimulant laxatives like Docolax for regular use. -  Consider adding fiber supplements like Metamucil if needed.  Follow-Up Follow-up appointment scheduled for October. Needs to schedule an appointment for November. - Schedule follow-up appointment for November.    Deanna Rowe was informed of the importance of frequent follow up visits to maximize her success with intensive lifestyle modifications for her obesity and obesity related health conditions as recommended by USPSTF and CMS guidelines   Louann Penton, MD

## 2023-12-27 ENCOUNTER — Other Ambulatory Visit (HOSPITAL_BASED_OUTPATIENT_CLINIC_OR_DEPARTMENT_OTHER): Payer: Self-pay

## 2023-12-27 ENCOUNTER — Ambulatory Visit: Attending: Interventional Cardiology | Admitting: Pharmacist

## 2023-12-27 DIAGNOSIS — Z794 Long term (current) use of insulin: Secondary | ICD-10-CM | POA: Diagnosis not present

## 2023-12-27 DIAGNOSIS — E1165 Type 2 diabetes mellitus with hyperglycemia: Secondary | ICD-10-CM | POA: Diagnosis not present

## 2023-12-27 DIAGNOSIS — Z79899 Other long term (current) drug therapy: Secondary | ICD-10-CM

## 2023-12-27 NOTE — Progress Notes (Signed)
 S: Patient presents for review of their specialty medication therapy.  Patient is currently taking Xolair  for asthma. Patient is managed by Dr. Lorin for this.   Adherence: confirmed.   Efficacy: reports that Xolair  is working about ~50%. She still experiences a lot of coughing.   Dosing: Give subcutaneously.  Can be dosed every 2 or 4 weeks based on baseline serum IgE levels and body weight.  Asthma: SubQ: Dose and frequency based on body weight and pretreatment total IgE serum levels. Dosing should be adjusted during therapy for significant changes in body weight. Dosing should not be adjusted based on total IgE levels taken during treatment or <1 year following interruption of therapy. If therapy has been interrupted for >=1 year, total IgE levels may be re-evaluated for dosage determination.  Pretreatment serum IgE >=30 to 100 units/mL:   >90 to 150 kg: 300 mg every 4 weeks  Dose adjustments: Renal: no dose adjustments  Hepatic: no dose adjustments  Toxicity: Severe hypersensitivity reaction or anaphylaxis: Discontinue treatment. Fever, arthralgia, and rash: Discontinue treatment if this constellation of symptoms occurs.  Drug-drug interactions: none identified   Monitoring: CV effects: none Eosinophilia and vasculitis: none Fever/arthralgia/rash: none Hypersensitivity/Anaphylaxis: none Malignant neoplasms: none Pulmonary function tests (for asthma): none  O: Lab Results  Component Value Date   WBC 5.4 05/15/2023   HGB 12.3 05/15/2023   HCT 36.4 05/15/2023   MCV 99 (H) 05/15/2023   PLT 242 05/15/2023      Chemistry      Component Value Date/Time   NA 128 (L) 08/27/2022 2108   NA 138 04/05/2022 0942   NA 142 01/12/2017 0914   K 3.9 08/27/2022 2108   K 4.2 01/12/2017 0914   CL 96 (L) 08/27/2022 2108   CO2 24 08/27/2022 2108   CO2 28 01/12/2017 0914   BUN 16 08/27/2022 2108   BUN 19 04/05/2022 0942   BUN 12.0 01/12/2017 0914   CREATININE 1.26 (H)  08/27/2022 2108   CREATININE 1.58 (H) 10/12/2018 1121   CREATININE 1.3 (H) 01/12/2017 0914      Component Value Date/Time   CALCIUM  8.7 (L) 08/27/2022 2108   CALCIUM  9.6 01/12/2017 0914   ALKPHOS 47 08/27/2022 2108   ALKPHOS 43 01/12/2017 0914   AST 18 08/27/2022 2108   AST 16 10/12/2018 1121   AST 19 01/12/2017 0914   ALT 5 08/27/2022 2108   ALT <6 10/12/2018 1121   ALT 18 01/12/2017 0914   BILITOT 0.4 08/27/2022 2108   BILITOT 0.3 04/05/2022 0942   BILITOT 0.2 (L) 10/12/2018 1121   BILITOT 0.28 01/12/2017 0914     A/P: 1. Medication review: patient currently on Xolair  for asthma. Reviewed the medication with the patient, including the following: Xolair , omalizumab , is a novel IgE blocker.  It appears to reduce rates of hospitalizations, ER visits and unscheduled physician visits due asthma exacerbations when added to standard therapy.  Studies also show a reduction in steroid requirements and improvement in quality of life.  Patient educated on purpose, proper use and potential adverse effects of Xolair .  Following instruction patient verbalized understanding. Patient should always have an EpiPen  readily available in the event of anaphylaxis. SubQ: For SubQ injection only; doses >150 mg should be divided over more than one injection site (eg, 225 mg or 300 mg administered as two injections, 375 mg administered as three injections); each injection site should be separated by >=1 inch. Do not inject into moles, scars, bruises, tender areas, or broken  skin. Injections may take 5 to 10 seconds to administer (solution is slightly viscous). Administer only under direct medical supervision and observe patient for 2 hours after the first 3 injections and 30 minutes after subsequent injections Serge 2015) or in accordance with individual institution policies and procedures. No recommendations for any changes at this time. She plans on discussing possible changes with her Allergy/Asthma physician  next month.   Herlene Fleeta Morris, PharmD, JAQUELINE, CPP Clinical Pharmacist [facility] [phone]

## 2023-12-28 DIAGNOSIS — K1370 Unspecified lesions of oral mucosa: Secondary | ICD-10-CM | POA: Diagnosis not present

## 2023-12-29 ENCOUNTER — Other Ambulatory Visit (HOSPITAL_BASED_OUTPATIENT_CLINIC_OR_DEPARTMENT_OTHER): Payer: Self-pay

## 2024-01-02 ENCOUNTER — Ambulatory Visit: Admitting: Physical Therapy

## 2024-01-03 ENCOUNTER — Other Ambulatory Visit (HOSPITAL_BASED_OUTPATIENT_CLINIC_OR_DEPARTMENT_OTHER): Payer: Self-pay

## 2024-01-06 ENCOUNTER — Encounter (HOSPITAL_BASED_OUTPATIENT_CLINIC_OR_DEPARTMENT_OTHER): Payer: Self-pay | Admitting: Emergency Medicine

## 2024-01-06 ENCOUNTER — Emergency Department (HOSPITAL_BASED_OUTPATIENT_CLINIC_OR_DEPARTMENT_OTHER)
Admission: EM | Admit: 2024-01-06 | Discharge: 2024-01-06 | Disposition: A | Discharge status: Home / Self Care | Attending: Emergency Medicine | Admitting: Emergency Medicine

## 2024-01-06 ENCOUNTER — Emergency Department (HOSPITAL_BASED_OUTPATIENT_CLINIC_OR_DEPARTMENT_OTHER)

## 2024-01-06 DIAGNOSIS — G20A1 Parkinson's disease without dyskinesia, without mention of fluctuations: Secondary | ICD-10-CM | POA: Insufficient documentation

## 2024-01-06 DIAGNOSIS — Z7982 Long term (current) use of aspirin: Secondary | ICD-10-CM | POA: Insufficient documentation

## 2024-01-06 DIAGNOSIS — S8262XA Displaced fracture of lateral malleolus of left fibula, initial encounter for closed fracture: Secondary | ICD-10-CM | POA: Diagnosis not present

## 2024-01-06 DIAGNOSIS — W19XXXA Unspecified fall, initial encounter: Secondary | ICD-10-CM | POA: Diagnosis not present

## 2024-01-06 DIAGNOSIS — E119 Type 2 diabetes mellitus without complications: Secondary | ICD-10-CM | POA: Diagnosis not present

## 2024-01-06 DIAGNOSIS — Z794 Long term (current) use of insulin: Secondary | ICD-10-CM | POA: Insufficient documentation

## 2024-01-06 DIAGNOSIS — S99912A Unspecified injury of left ankle, initial encounter: Secondary | ICD-10-CM | POA: Diagnosis present

## 2024-01-06 DIAGNOSIS — Z7984 Long term (current) use of oral hypoglycemic drugs: Secondary | ICD-10-CM | POA: Insufficient documentation

## 2024-01-06 DIAGNOSIS — N189 Chronic kidney disease, unspecified: Secondary | ICD-10-CM | POA: Diagnosis not present

## 2024-01-06 MED ORDER — HYDROCODONE-ACETAMINOPHEN 5-325 MG PO TABS
1.0000 | ORAL_TABLET | Freq: Once | ORAL | Status: AC
  Administered 2024-01-06: 1 via ORAL
  Filled 2024-01-06: qty 1

## 2024-01-06 MED ORDER — ONDANSETRON 4 MG PO TBDP
4.0000 mg | ORAL_TABLET | Freq: Once | ORAL | Status: AC
  Administered 2024-01-06: 4 mg via ORAL
  Filled 2024-01-06: qty 1

## 2024-01-06 MED ORDER — HYDROCODONE-ACETAMINOPHEN 5-325 MG PO TABS: 1.0000 | ORAL_TABLET | Freq: Once | ORAL | Status: DC

## 2024-01-06 MED ORDER — OXYCODONE-ACETAMINOPHEN 5-325 MG PO TABS: 1.0000 | ORAL_TABLET | Freq: Four times a day (QID) | ORAL | 0 refills | Status: DC | PRN

## 2024-01-06 NOTE — ED Triage Notes (Signed)
 Pt reports she fell just PTA; c/o LT ankle pain; swelling noted, unable to bear weight

## 2024-01-06 NOTE — ED Provider Notes (Signed)
  EMERGENCY DEPARTMENT AT [facility] Provider Note   CSN: [id number] Arrival date & time: 01/06/24  1351    Patient presents with: Fall  Deanna Rowe is a 68 y.o. female history of Parkinson's, CKD, diabetes, TIA, neuropathy here for evaluation of fall.  She fell just PTA.  Had immediate pain to her left ankle with swelling.  Uses cane, Rolator at baseline.  No numbness or weakness.  Has some pain to her proximal fibula as well.  No tenderness to knee, fibula, pelvis.  She denies hitting her head, LOC or anticoagulation.     HPI     Prior to Admission medications   Medication Sig Start Date End Date Taking? Authorizing Provider  oxyCODONE -acetaminophen  (PERCOCET/ROXICET) 5-325 MG tablet Take 1 tablet by mouth every 6 (six) hours as needed for severe pain (pain score 7-10). 01/06/24  Yes Jamille Fisher A, PA-C  ACCU-CHEK FASTCLIX LANCETS MISC USE TO CHECK BLOOD SUGAR 3 TIMES A DAY 02/27/18   Kassie Mallick, MD  Accu-Chek FastClix Lancets MISC Use to check blood sugar 3 times daily. 08/10/23   Faythe Purchase, MD  ACCU-CHEK GUIDE test strip USE TO CHECK BLOOD SUGAR 3 TIMES PER DAY. Patient taking differently: Use to check blood sugar 4 times per day. 02/02/17   Kassie Mallick, MD  albuterol  (VENTOLIN  HFA) 108 [phone] Base) MCG/ACT inhaler Inhale 2 puffs into the lungs as needed for wheezing or shortness of breath. 07/06/20   Cari Arlean HERO, FNP  amLODipine  (NORVASC ) 5 MG tablet Take 5 mg by mouth daily.    [provider]  amLODipine  (NORVASC ) 5 MG tablet Take 1 tablet (5 mg total) by mouth daily. 12/12/23     aspirin  EC 81 MG tablet Take 81 mg by mouth every evening.    [provider]  atorvastatin  (LIPITOR) 10 MG tablet Take 1 tablet (10 mg total) by mouth at bedtime. 08/03/23   Dick, Ernest H Jr., NP  azelastine  (ASTELIN ) 0.1 % nasal spray Place 2 sprays into both nostrils 2 (two) times daily as needed. 11/25/21   Cheryl Reusing, FNP  Blood Glucose Monitoring  Suppl (FREESTYLE LITE) w/Device KIT Use to check blood sugar three times daily. 08/10/23   Faythe Purchase, MD  brexpiprazole  (REXULTI ) 2 MG TABS tablet Take 1 tablet (2 mg total) by mouth in the morning. 10/18/23     buPROPion  (WELLBUTRIN  XL) 150 MG 24 hr tablet Take 3 tablets (450 mg total) by mouth in the morning. 10/18/23     Calcium  Carbonate-Vitamin D  600-400 MG-UNIT tablet Take 1 tablet by mouth daily.    [provider]  carbidopa -levodopa  (SINEMET  CR) 50-200 MG tablet Take 1 tablet by mouth 5 (five) times daily. 8am-11am-2pm-5pm-8pm. 12/19/23   Buck Saucer, MD  Continuous Blood Gluc Sensor (DEXCOM G7 SENSOR) MISC Use to check blood sugar and change sensor every 10 days 11/25/21     dapagliflozin  propanediol (FARXIGA ) 5 MG TABS tablet Take 1 tablet by mouth once daily. 11/13/23     EPINEPHrine  (EPIPEN  2-PAK) 0.3 mg/0.3 mL IJ SOAJ injection Inject 0.3 mg into the muscle as needed for anaphylaxis. 07/25/23   Lorin Norris, MD  gabapentin  (NEURONTIN ) 300 MG capsule Take 1 capsule (300 mg total) by mouth 3 (three) times daily. 08/08/23     glucose blood test strip Use to check blood sugar 4 times daily 09/30/21     LORazepam  (ATIVAN ) 2 MG tablet Take 1 tablet (2 mg total) by mouth 3 (three) times daily. 10/18/23  losartan  (COZAAR ) 50 MG tablet Take 1 tablet (50 mg total) by mouth daily. 08/03/23   Wyn Jackee VEAR Mickey., NP  Olopatadine  HCl (PATADAY ) 0.2 % SOLN Place 1 drop into both eyes daily as needed. 07/06/20   Cari Arlean HERO, FNP  omalizumab  (XOLAIR ) 300 MG/2  ML prefilled syringe Inject 300 mg into the skin every 28 (twenty-eight) days. 07/04/23   Jegede, Olugbemiga E, MD  oxybutynin  (DITROPAN -XL) 10 MG 24 hr tablet Take 1 tablet (10 mg total) by mouth daily. 02/02/23     propranolol  (INDERAL ) 40 MG tablet Take 1 tablet (40 mg total) by mouth 2 (two) times daily. 08/03/23   Wyn Jackee VEAR Mickey., NP  rOPINIRole  (REQUIP ) 2 MG tablet Take 1 tablet (2 mg total) by mouth 2 (two) times daily. 03/22/23    Reddy, Keshavpal Gunna, MD  rOPINIRole  (REQUIP ) 2 MG tablet Take 1 tablet (2 mg total) by mouth 2 (two) times daily. 10/18/23     tirzepatide  (MOUNJARO ) 15 MG/0.5ML Pen Inject 15 mg into the skin once a week. 12/25/23   Verdon Parry D, MD  triamcinolone  (NASACORT ) 55 MCG/ACT AERO nasal inhaler Place 2 sprays into the nose daily. 07/06/20   Cari Arlean HERO, FNP  valACYclovir  (VALTREX ) 500 MG tablet Take 1 tablet (500 mg total) by mouth daily. 12/23/23     vitamin C (ASCORBIC ACID) 500 MG tablet Take 500 mg by mouth daily.    [provider]  Vitamin D , Ergocalciferol , 2000 units CAPS Take 1 capsule by mouth daily. 2000 units daily.    [provider]  vortioxetine  HBr (TRINTELLIX ) 20 MG TABS tablet Take 1 tablet (20 mg total) by mouth in the morning. 10/18/23     zolpidem  (AMBIEN ) 5 MG tablet Take 1 tablet (5 mg total) by mouth at bedtime. 10/18/23       Allergies: Fluticasone -salmeterol, Codeine, Fetzima [levomilnacipran], Nsaids, Trulicity [dulaglutide], Xanax [alprazolam], Amoxicillin, and Pseudoephedrine  hcl er    Review of Systems  Constitutional: Negative.   HENT: Negative.    Respiratory: Negative.    Cardiovascular: Negative.   Gastrointestinal: Negative.   Genitourinary: Negative.   Musculoskeletal:        Left ankle pain  Skin: Negative.   Neurological: Negative.   All other systems reviewed and are negative.   Updated Vital Signs BP 131/76 (BP Location: Left Arm)   Pulse 72   Temp (!) 97.5 F (36.4 C) (Oral)   Resp 17   Ht 5' 6 (1.676 m)   Wt 83.9 kg   SpO2 98%   BMI 29.86 kg/m   Physical Exam Vitals and nursing note reviewed.  Constitutional:      General: She is not in acute distress.    Appearance: She is well-developed. She is not ill-appearing.  HENT:     Head: Atraumatic.  Eyes:     Pupils: Pupils are equal, round, and reactive to light.  Cardiovascular:     Rate and Rhythm: Normal rate.     Pulses:          Dorsalis pedis pulses are 1+ on  the right side and 1+ on the left side.  Pulmonary:     Effort: No respiratory distress.  Abdominal:     General: There is no distension.  Musculoskeletal:        General: Normal range of motion.     Cervical back: Normal range of motion.     Comments: Tenderness left lateral malleolus, left proximal fibular head.  Overlying soft  tissue swelling left ankle.  No bony tenderness to foot, knee, femur, pelvis.  Compartments soft.  Wiggles toes without difficulty.  Able to flex and extend at knee and hip without difficulty  Skin:    General: Skin is warm and dry.     Capillary Refill: Capillary refill takes less than 2 seconds.     Comments: Soft tissue edema left ankle.  No erythema, warmth  Neurological:     General: No focal deficit present.     Mental Status: She is alert.     Comments: Unable to ambulate due to pain.  Intact sensation  Psychiatric:        Mood and Affect: Mood normal.     (all labs ordered are listed, but only abnormal results are displayed) Labs Reviewed - No data to display  EKG: None  Radiology: DG Tibia/Fibula Left Result Date: 01/06/2024 CLINICAL DATA:  Status post fall. EXAM: LEFT TIBIA AND FIBULA - 2 VIEW COMPARISON:  None Available. FINDINGS: Acute mildly displaced fracture deformity is seen involving the left lateral malleolus. There is no evidence of dislocation. Mild soft tissue swelling is seen adjacent to the previously noted fracture site. IMPRESSION: Acute fracture of the left lateral malleolus. Electronically Signed   By: Suzen Dials M.D.   On: 01/06/2024 15:58   DG Ankle Complete Left Result Date: 01/06/2024 CLINICAL DATA:  Status post fall. EXAM: LEFT ANKLE COMPLETE - 3+ VIEW COMPARISON:  None Available. FINDINGS: There is an acute, mildly displaced fracture of the left lateral malleolus. Mild lateral subluxation of the tibiotalar joint is noted with medial widening of the left ankle mortise. Mild to moderate severity diffuse soft tissue  swelling is present. IMPRESSION: Acute fracture of the left lateral malleolus with mild lateral subluxation of the tibiotalar joint. Electronically Signed   By: Suzen Dials M.D.   On: 01/06/2024 14:48     .Splint Application  Date/Time: 01/06/2024 4:45 PM  Performed by: Deanna Rosebud LABOR, PA-C Authorized by: Deanna Rosebud LABOR, PA-C   Consent:    Consent obtained:  Verbal   Consent given by:  Patient   Risks, benefits, and alternatives were discussed: yes     Risks discussed:  Discoloration, numbness, pain and swelling   Alternatives discussed:  No treatment, delayed treatment, alternative treatment, observation and referral Universal protocol:    Procedure explained and questions answered to patient or proxy's satisfaction: yes     Relevant documents present and verified: yes     Test results available: yes     Imaging studies available: yes     Required blood products, implants, devices, and special equipment available: yes     Site/side marked: yes     Immediately prior to procedure a time out was called: yes     Patient identity confirmed:  Verbally with patient Pre-procedure details:    Distal neurologic exam:  Normal   Distal perfusion: distal pulses strong and brisk capillary refill   Procedure details:    Location:  Ankle   Ankle location:  L ankle   Strapping: yes     Cast type:  Short leg   Splint type:  Ankle stirrup   Supplies:  Cotton padding, elastic bandage and fiberglass   Attestation: Splint applied and adjusted personally by me   Post-procedure details:    Distal neurologic exam:  Normal   Distal perfusion: distal pulses strong and brisk capillary refill     Procedure completion:  Tolerated well, no immediate complications  Post-procedure imaging: not applicable      Medications Ordered in the ED  HYDROcodone -acetaminophen  (NORCO/VICODIN) 5-325 MG per tablet 1 tablet (1 tablet Oral Not Given 01/06/24 1639)  HYDROcodone -acetaminophen  (NORCO/VICODIN)  5-325 MG per tablet 1 tablet (1 tablet Oral Given 01/06/24 1513)  ondansetron  (ZOFRAN -ODT) disintegrating tablet 4 mg (4 mg Oral Given 01/06/24 5367)   68 year old multiple medical comorbidities here for evaluation mechanical fall PTA.  Tripped and fell inverting her left ankle.  Denies hitting her head, LOC or any coagulation.  She has pain and swelling to her left lateral malleolus and left proximal fibular head.  She is neurovascularly intact.  Her compartments are soft.  No overlying erythema, warmth, ecchymosis.  Does have some diffuse soft tissue swelling to the ankle.  Typically uses cane, rollator at baseline due to Parkinson's.  Will plan on imaging, pain control, reassess  Imaging personally viewed and interpreted:  X-ray left ankle lateral malleolus fracture with mild lateral subluxation of the tibiotalar joint X-ray left tib-fib without proximal fibular fracture does redemonstrate lateral malleolar fracture  Discussed results with patient, family in room.  Will plan on splint.  She is neurovascularly intact, no evidence of open fracture, compartment syndrome, VTE, ischemia, infectious process.  Patient splinted.  Neurovascular intact after splint.  I difficulty with walker however patient and family states they have wheelchair at home.  She ready follows with orthopedics.  Will refer her back to her orthopedist.  Discussed nonweightbearing, right symptomatic management, Norco as needed.  The patient has been appropriately medically screened and/or stabilized in the ED. I have low suspicion for any other emergent medical condition which would require further screening, evaluation or treatment in the ED or require inpatient management.  Patient is hemodynamically stable and in no acute distress.  Patient able to ambulate in department prior to ED.  Evaluation does not show acute pathology that would require ongoing or additional emergent interventions while in the emergency department or  further inpatient treatment.  I have discussed the diagnosis with the patient and answered all questions.  Pain is been managed while in the emergency department and patient has no further complaints prior to discharge.  Patient is comfortable with plan discussed in room and is stable for discharge at this time.  I have discussed strict return precautions for returning to the emergency department.  Patient was encouraged to follow-up with PCP/specialist refer to at discharge.                                   Medical Decision Making Amount and/or Complexity of Data Reviewed Independent Historian:     Details: Family in room External Data Reviewed: labs, radiology and notes. Radiology: ordered and independent interpretation performed. Decision-making details documented in ED Course.  Risk OTC drugs. Prescription drug management. Decision regarding hospitalization. Diagnosis or treatment significantly limited by social determinants of health.       Final diagnoses:  Closed displaced fracture of lateral malleolus of left fibula, initial encounter    ED Discharge Orders          Ordered    oxyCODONE -acetaminophen  (PERCOCET/ROXICET) 5-325 MG tablet  Every 6 hours PRN        01/06/24 1647               Darnelle Derrick A, PA-C 01/06/24 1647    Elnor Jayson LABOR, DO 01/11/24 2352

## 2024-01-06 NOTE — Discharge Instructions (Signed)
 It was a pleasure taking care of you here today  Your x-ray did show a broken ankle.  We have put you in a splint.  It is important to not walk on this area.  Use the wheelchair and walker.  I have written you for some pain medicine.  Take as prescribed.  Please be aware that this medication may make you sleepy, lightheaded.  Ice and elevate the ankle.  Follow-up with your orthopedics early next week  Return for new or worsening symptoms

## 2024-01-08 ENCOUNTER — Other Ambulatory Visit (HOSPITAL_BASED_OUTPATIENT_CLINIC_OR_DEPARTMENT_OTHER): Payer: Self-pay

## 2024-01-08 DIAGNOSIS — M25561 Pain in right knee: Secondary | ICD-10-CM | POA: Diagnosis not present

## 2024-01-08 DIAGNOSIS — S8262XA Displaced fracture of lateral malleolus of left fibula, initial encounter for closed fracture: Secondary | ICD-10-CM | POA: Diagnosis not present

## 2024-01-09 ENCOUNTER — Encounter (HOSPITAL_BASED_OUTPATIENT_CLINIC_OR_DEPARTMENT_OTHER)
Admission: RE | Admit: 2024-01-09 | Discharge: 2024-01-09 | Disposition: A | Discharge status: Home / Self Care | Source: Ambulatory Visit | Attending: Orthopedic Surgery | Admitting: Orthopedic Surgery

## 2024-01-09 ENCOUNTER — Encounter (HOSPITAL_BASED_OUTPATIENT_CLINIC_OR_DEPARTMENT_OTHER): Payer: Self-pay | Admitting: Orthopedic Surgery

## 2024-01-09 ENCOUNTER — Other Ambulatory Visit: Payer: Self-pay | Admitting: Orthopedic Surgery

## 2024-01-09 ENCOUNTER — Other Ambulatory Visit: Payer: Self-pay

## 2024-01-09 ENCOUNTER — Telehealth: Payer: Self-pay | Admitting: Neurology

## 2024-01-09 DIAGNOSIS — Z01818 Encounter for other preprocedural examination: Secondary | ICD-10-CM | POA: Diagnosis not present

## 2024-01-09 LAB — BASIC METABOLIC PANEL WITH GFR
Anion gap: 9 (ref 5–15)
BUN: 14 mg/dL (ref 8–23)
CO2: 24 mmol/L (ref 22–32)
Calcium: 9 mg/dL (ref 8.9–10.3)
Chloride: 94 mmol/L — ABNORMAL LOW (ref 98–111)
Creatinine, Ser: 1.29 mg/dL — ABNORMAL HIGH (ref 0.44–1.00)
GFR, Estimated: 45 mL/min — ABNORMAL LOW (ref >60)
Glucose, Bld: 97 mg/dL (ref 70–99)
Potassium: 5.2 mmol/L — ABNORMAL HIGH (ref 3.5–5.1)
Sodium: 127 mmol/L — ABNORMAL LOW (ref 135–145)

## 2024-01-09 NOTE — Telephone Encounter (Signed)
 Pt Husband called to request to speak to MD about changing wife medication back to Regular dosage Pt husband would like to discuss if Pt can get back on Short acting dosage  of carbidopa -levodopa  (SINEMET  CR)  25-250 mg. PT Husband states that he still have Pt medication However would like to discuss this with With MD due to PT keep having falls

## 2024-01-09 NOTE — Telephone Encounter (Signed)
 Spoke to Husband (checked DPR)  Husband states pt has declined since stopping Carbidopa -levodopa  25-250 BID Husband states pt has fallen twice . Husband states  pt fell last week and hit head Pt went to ED and had CT scan Pt fell two days ago  and broke her ankle Husband states went since pt stopped Carbidopa -levodopa  25-250 pt has had a cognitive decline . Husband wants pt to restart  Carbidopa -levodopa  25-250 . Will forward to Dr Buck to get her recommendations . Husband expressed understanding and thanked me for calling .

## 2024-01-10 ENCOUNTER — Ambulatory Visit: Admitting: Internal Medicine

## 2024-01-10 ENCOUNTER — Encounter (HOSPITAL_BASED_OUTPATIENT_CLINIC_OR_DEPARTMENT_OTHER)
Admission: RE | Admit: 2024-01-10 | Discharge: 2024-01-10 | Disposition: A | Discharge status: Home / Self Care | Source: Ambulatory Visit | Attending: Orthopedic Surgery | Admitting: Orthopedic Surgery

## 2024-01-10 DIAGNOSIS — E119 Type 2 diabetes mellitus without complications: Secondary | ICD-10-CM | POA: Diagnosis not present

## 2024-01-10 DIAGNOSIS — J45909 Unspecified asthma, uncomplicated: Secondary | ICD-10-CM | POA: Diagnosis not present

## 2024-01-10 DIAGNOSIS — G20A1 Parkinson's disease without dyskinesia, without mention of fluctuations: Secondary | ICD-10-CM | POA: Diagnosis not present

## 2024-01-10 DIAGNOSIS — Z8673 Personal history of transient ischemic attack (TIA), and cerebral infarction without residual deficits: Secondary | ICD-10-CM | POA: Diagnosis not present

## 2024-01-10 DIAGNOSIS — S8262XA Displaced fracture of lateral malleolus of left fibula, initial encounter for closed fracture: Secondary | ICD-10-CM | POA: Diagnosis not present

## 2024-01-10 DIAGNOSIS — I10 Essential (primary) hypertension: Secondary | ICD-10-CM | POA: Diagnosis not present

## 2024-01-10 DIAGNOSIS — X58XXXA Exposure to other specified factors, initial encounter: Secondary | ICD-10-CM | POA: Diagnosis not present

## 2024-01-10 LAB — BASIC METABOLIC PANEL WITH GFR
Anion gap: 8 (ref 5–15)
BUN: 14 mg/dL (ref 8–23)
CO2: 26 mmol/L (ref 22–32)
Calcium: 9.1 mg/dL (ref 8.9–10.3)
Chloride: 95 mmol/L — ABNORMAL LOW (ref 98–111)
Creatinine, Ser: 1.4 mg/dL — ABNORMAL HIGH (ref 0.44–1.00)
GFR, Estimated: 41 mL/min — ABNORMAL LOW (ref >60)
Glucose, Bld: 105 mg/dL — ABNORMAL HIGH (ref 70–99)
Potassium: 4.8 mmol/L (ref 3.5–5.1)
Sodium: 129 mmol/L — ABNORMAL LOW (ref 135–145)

## 2024-01-10 NOTE — Anesthesia Preprocedure Evaluation (Signed)
 Anesthesia Evaluation  Patient identified by MRN, date of birth, ID band Patient awake    Reviewed: Allergy & Precautions, NPO status , Unable to perform ROS - Chart review only  History of Anesthesia Complications (+) PONV and history of anesthetic complications  Airway Mallampati: II  TM Distance: >3 FB Neck ROM: Full    Dental no notable dental hx. (+) Upper Dentures, Lower Dentures   Pulmonary asthma    Pulmonary exam normal breath sounds clear to auscultation       Cardiovascular hypertension, (-) angina (-) CAD and (-) Past MI Normal cardiovascular exam Rhythm:Regular Rate:Normal     Neuro/Psych  PSYCHIATRIC DISORDERS Anxiety Depression    Hx of parkinsons dz TIA   GI/Hepatic ,GERD  ,,  Endo/Other  diabetes    Renal/GU Renal InsufficiencyRenal diseaseLab Results      Component                Value               Date                      NA                       127 (L)             01/09/2024                CL                       94 (L)              01/09/2024                K                        5.2 (H)             01/09/2024                CO2                      24                  01/09/2024                BUN                      14                  01/09/2024                CREATININE               1.29 (H)            01/09/2024                GFRNONAA                 45 (L)              01/09/2024                      GLUCOSE                  97  01/09/2024                Musculoskeletal  (+) Arthritis ,    Abdominal   Peds  Hematology   Anesthesia Other Findings All: Xanax, NSAIDS, dulaglutide,amoxicillin, codeine, fluticasone   Reproductive/Obstetrics                              Anesthesia Physical Anesthesia Plan  ASA: 3  Anesthesia Plan: General   Post-op Pain Management: Regional block* and Minimal or no pain anticipated   Induction:  Intravenous  PONV Risk Score and Plan: 4 or greater and Treatment may vary due to age or medical condition, Midazolam , Dexamethasone , Propofol  infusion and TIVA  Airway Management Planned: LMA  Additional Equipment: None  Intra-op Plan:   Post-operative Plan:   Informed Consent: I have reviewed the patients History and Physical, chart, labs and discussed the procedure including the risks, benefits and alternatives for the proposed anesthesia with the patient or authorized representative who has indicated his/her understanding and acceptance.     Dental advisory given  Plan Discussed with:   Anesthesia Plan Comments: (L Pop + LMA GA)         Anesthesia Quick Evaluation

## 2024-01-10 NOTE — Progress Notes (Signed)
 Labs reviewed with Dr. CHARLENA Needle, need repeat BMET for K & Na. Spoke with patient who states she will come today for repeat labs.

## 2024-01-10 NOTE — Telephone Encounter (Signed)
 It looks like she is scheduled for surgery tomorrow.  Please call husband and offer follow-up appointment after she has had a chance to recuperate from her surgery so we can discuss medication management.  Please advise husband that her fall risk is rather high particularly because she is on multiple medications that can affect balance and are potentially sedating.  Hydration status and blood pressure fluctuation can also affect fall risk.  We have discussed her fall risk and medication related side effects at length at previous appointments. Unfortunately, it is not as simple as restarting her levodopa  immediate release strength back.  She can continue with long-acting levodopa  1 pill 5 times a day for now, as discussed in August. Medications that can affect/increase her fall risk include Ambien , Trintellix , Requip , propranolol , oxycodone , oxybutynin , lorazepam , gabapentin , Rexulti .  Levodopa  can cause blood pressure fluctuation, particularly low blood pressure.

## 2024-01-10 NOTE — Telephone Encounter (Signed)
 Spoke with pt's husband and discussed the message below by Dr Buck in response to husband's call. He verbalized understanding and will have pt continue on current carbidopa /levodopa  CR 1 tab 5 times daily. He will also discuss the other medications with psychiatry. I was able to schedule a sooner f/u for 04/15/24 at 2:00 pm. He thanked me for the call and his questions were answered.

## 2024-01-11 ENCOUNTER — Ambulatory Visit (HOSPITAL_BASED_OUTPATIENT_CLINIC_OR_DEPARTMENT_OTHER): Payer: Self-pay | Admitting: Anesthesiology

## 2024-01-11 ENCOUNTER — Other Ambulatory Visit (INDEPENDENT_AMBULATORY_CARE_PROVIDER_SITE_OTHER): Payer: Self-pay | Admitting: Family Medicine

## 2024-01-11 ENCOUNTER — Ambulatory Visit (HOSPITAL_BASED_OUTPATIENT_CLINIC_OR_DEPARTMENT_OTHER)
Admission: RE | Admit: 2024-01-11 | Discharge: 2024-01-11 | Disposition: A | Discharge status: Home / Self Care | Attending: Orthopedic Surgery | Admitting: Orthopedic Surgery

## 2024-01-11 ENCOUNTER — Encounter (HOSPITAL_BASED_OUTPATIENT_CLINIC_OR_DEPARTMENT_OTHER): Admission: RE | Disposition: A | Payer: Self-pay | Source: Home / Self Care | Attending: Orthopedic Surgery

## 2024-01-11 ENCOUNTER — Encounter (HOSPITAL_BASED_OUTPATIENT_CLINIC_OR_DEPARTMENT_OTHER): Payer: Self-pay | Admitting: Orthopedic Surgery

## 2024-01-11 ENCOUNTER — Other Ambulatory Visit: Payer: Self-pay

## 2024-01-11 ENCOUNTER — Other Ambulatory Visit (HOSPITAL_BASED_OUTPATIENT_CLINIC_OR_DEPARTMENT_OTHER): Payer: Self-pay

## 2024-01-11 ENCOUNTER — Ambulatory Visit (HOSPITAL_BASED_OUTPATIENT_CLINIC_OR_DEPARTMENT_OTHER)

## 2024-01-11 ENCOUNTER — Encounter (HOSPITAL_BASED_OUTPATIENT_CLINIC_OR_DEPARTMENT_OTHER): Payer: Self-pay | Admitting: Anesthesiology

## 2024-01-11 DIAGNOSIS — G4733 Obstructive sleep apnea (adult) (pediatric): Secondary | ICD-10-CM

## 2024-01-11 DIAGNOSIS — I10 Essential (primary) hypertension: Secondary | ICD-10-CM | POA: Insufficient documentation

## 2024-01-11 DIAGNOSIS — S93432A Sprain of tibiofibular ligament of left ankle, initial encounter: Secondary | ICD-10-CM | POA: Diagnosis not present

## 2024-01-11 DIAGNOSIS — E119 Type 2 diabetes mellitus without complications: Secondary | ICD-10-CM | POA: Diagnosis not present

## 2024-01-11 DIAGNOSIS — J45909 Unspecified asthma, uncomplicated: Secondary | ICD-10-CM | POA: Insufficient documentation

## 2024-01-11 DIAGNOSIS — X58XXXA Exposure to other specified factors, initial encounter: Secondary | ICD-10-CM | POA: Insufficient documentation

## 2024-01-11 DIAGNOSIS — S8262XA Displaced fracture of lateral malleolus of left fibula, initial encounter for closed fracture: Secondary | ICD-10-CM | POA: Insufficient documentation

## 2024-01-11 DIAGNOSIS — F418 Other specified anxiety disorders: Secondary | ICD-10-CM | POA: Diagnosis not present

## 2024-01-11 DIAGNOSIS — Z8673 Personal history of transient ischemic attack (TIA), and cerebral infarction without residual deficits: Secondary | ICD-10-CM | POA: Insufficient documentation

## 2024-01-11 DIAGNOSIS — E1122 Type 2 diabetes mellitus with diabetic chronic kidney disease: Secondary | ICD-10-CM | POA: Diagnosis not present

## 2024-01-11 DIAGNOSIS — I129 Hypertensive chronic kidney disease with stage 1 through stage 4 chronic kidney disease, or unspecified chronic kidney disease: Secondary | ICD-10-CM | POA: Diagnosis not present

## 2024-01-11 DIAGNOSIS — Z01818 Encounter for other preprocedural examination: Secondary | ICD-10-CM

## 2024-01-11 DIAGNOSIS — N183 Chronic kidney disease, stage 3 unspecified: Secondary | ICD-10-CM | POA: Diagnosis not present

## 2024-01-11 DIAGNOSIS — G20A1 Parkinson's disease without dyskinesia, without mention of fluctuations: Secondary | ICD-10-CM | POA: Insufficient documentation

## 2024-01-11 HISTORY — PX: SYNDESMOSIS REPAIR: SHX5182

## 2024-01-11 HISTORY — PX: ORIF ANKLE FRACTURE: SHX5408

## 2024-01-11 LAB — GLUCOSE, CAPILLARY
Glucose-Capillary: 82 mg/dL (ref 70–99)
Glucose-Capillary: 103 mg/dL — ABNORMAL HIGH (ref 70–99)

## 2024-01-11 SURGERY — OPEN REDUCTION INTERNAL FIXATION (ORIF) ANKLE FRACTURE
Anesthesia: General | Site: Ankle | Laterality: Left

## 2024-01-11 MED ORDER — MIDAZOLAM HCL 2 MG/2ML IJ SOLN
2.0000 mg | Freq: Once | INTRAMUSCULAR | Status: AC
  Administered 2024-01-11: 1 mg via INTRAVENOUS

## 2024-01-11 MED ORDER — PROPOFOL 10 MG/ML IV BOLUS
INTRAVENOUS | Status: DC | PRN
  Administered 2024-01-11: 140 mg via INTRAVENOUS

## 2024-01-11 MED ORDER — EPHEDRINE SULFATE (PRESSORS) 50 MG/ML IJ SOLN
INTRAMUSCULAR | Status: DC | PRN
  Administered 2024-01-11: 5 mg via INTRAVENOUS

## 2024-01-11 MED ORDER — OXYCODONE HCL 5 MG/5ML PO SOLN: 5.0000 mg | Freq: Once | ORAL | Status: DC | PRN

## 2024-01-11 MED ORDER — PHENYLEPHRINE HCL (PRESSORS) 10 MG/ML IV SOLN: INTRAVENOUS | Status: DC | PRN

## 2024-01-11 MED ORDER — FENTANYL CITRATE (PF) 100 MCG/2ML IJ SOLN
INTRAMUSCULAR | Status: AC
  Filled 2024-01-11: qty 2

## 2024-01-11 MED ORDER — PHENYLEPHRINE HCL (PRESSORS) 10 MG/ML IV SOLN
INTRAVENOUS | Status: DC | PRN
  Administered 2024-01-11 (×4): 80 ug via INTRAVENOUS

## 2024-01-11 MED ORDER — PROPOFOL 500 MG/50ML IV EMUL
INTRAVENOUS | Status: DC | PRN
  Administered 2024-01-11: 150 ug/kg/min via INTRAVENOUS

## 2024-01-11 MED ORDER — LACTATED RINGERS IV SOLN: INTRAVENOUS | Status: DC

## 2024-01-11 MED ORDER — MIDAZOLAM HCL 2 MG/2ML IJ SOLN
INTRAMUSCULAR | Status: AC
  Filled 2024-01-11: qty 2

## 2024-01-11 MED ORDER — 0.9 % SODIUM CHLORIDE (POUR BTL) OPTIME
TOPICAL | Status: DC | PRN
  Administered 2024-01-11: 300 mL

## 2024-01-11 MED ORDER — FENTANYL CITRATE (PF) 100 MCG/2ML IJ SOLN
100.0000 ug | Freq: Once | INTRAMUSCULAR | Status: AC
  Administered 2024-01-11: 50 ug via INTRAVENOUS

## 2024-01-11 MED ORDER — HYDROMORPHONE HCL 1 MG/ML IJ SOLN: 0.2500 mg | INTRAMUSCULAR | Status: DC | PRN

## 2024-01-11 MED ORDER — DOCUSATE SODIUM 100 MG PO CAPS
100.0000 mg | ORAL_CAPSULE | Freq: Two times a day (BID) | ORAL | 0 refills | Status: AC
  Filled 2024-01-11: qty 100, 50d supply, fill #0

## 2024-01-11 MED ORDER — CEFAZOLIN SODIUM-DEXTROSE 2-4 GM/100ML-% IV SOLN
INTRAVENOUS | Status: AC
  Filled 2024-01-11: qty 100

## 2024-01-11 MED ORDER — ONDANSETRON HCL 4 MG/2ML IJ SOLN
INTRAMUSCULAR | Status: DC | PRN
  Administered 2024-01-11: 4 mg via INTRAVENOUS

## 2024-01-11 MED ORDER — DEXMEDETOMIDINE HCL IN NACL 80 MCG/20ML IV SOLN
INTRAVENOUS | Status: DC | PRN
  Administered 2024-01-11: 6 ug via INTRAVENOUS

## 2024-01-11 MED ORDER — OXYCODONE HCL 5 MG PO TABS
5.0000 mg | ORAL_TABLET | Freq: Four times a day (QID) | ORAL | 0 refills | Status: AC | PRN
  Filled 2024-01-11: qty 20, 5d supply, fill #0

## 2024-01-11 MED ORDER — CEFAZOLIN SODIUM-DEXTROSE 2-4 GM/100ML-% IV SOLN
2.0000 g | INTRAVENOUS | Status: AC
  Administered 2024-01-11: 2 g via INTRAVENOUS

## 2024-01-11 MED ORDER — SENNA 8.6 MG PO TABS
2.0000 | ORAL_TABLET | Freq: Two times a day (BID) | ORAL | 0 refills | Status: AC
  Filled 2024-01-11: qty 100, 25d supply, fill #0

## 2024-01-11 MED ORDER — LIDOCAINE HCL (CARDIAC) PF 100 MG/5ML IV SOSY
PREFILLED_SYRINGE | INTRAVENOUS | Status: DC | PRN
  Administered 2024-01-11: 80 mg via INTRAVENOUS

## 2024-01-11 MED ORDER — VANCOMYCIN HCL 500 MG IV SOLR
INTRAVENOUS | Status: DC | PRN
  Administered 2024-01-11: 500 mg via TOPICAL

## 2024-01-11 MED ORDER — ONDANSETRON HCL 4 MG/2ML IJ SOLN: 4.0000 mg | Freq: Once | INTRAMUSCULAR | Status: DC | PRN

## 2024-01-11 MED ORDER — OXYCODONE HCL 5 MG PO TABS: 5.0000 mg | ORAL_TABLET | Freq: Once | ORAL | Status: DC | PRN

## 2024-01-11 MED ORDER — DEXAMETHASONE SODIUM PHOSPHATE 10 MG/ML IJ SOLN
INTRAMUSCULAR | Status: DC | PRN
  Administered 2024-01-11: 4 mg via INTRAVENOUS

## 2024-01-11 MED ORDER — RIVAROXABAN 10 MG PO TABS
10.0000 mg | ORAL_TABLET | Freq: Every day | ORAL | 0 refills | Status: DC
Start: 2024-01-11 — End: 2024-01-23
  Filled 2024-01-11: qty 14, 14d supply, fill #0

## 2024-01-11 MED ORDER — ACETAMINOPHEN 10 MG/ML IV SOLN: 1000.0000 mg | Freq: Once | INTRAVENOUS | Status: DC | PRN

## 2024-01-11 MED ORDER — VANCOMYCIN HCL 500 MG IV SOLR
INTRAVENOUS | Status: AC
  Filled 2024-01-11: qty 10

## 2024-01-11 SURGICAL SUPPLY — 56 items
BIT DRILL 2.5X2.75 QC CALB (BIT) IMPLANT
BIT DRILL 3.5X5.5 QC CALB (BIT) IMPLANT
BLADE SURG 15 STRL LF DISP TIS (BLADE) ×2 IMPLANT
BNDG COMPR ESMARK 6X3 LF (GAUZE/BANDAGES/DRESSINGS) IMPLANT
BNDG ELASTIC 4INX 5YD STR LF (GAUZE/BANDAGES/DRESSINGS) ×1 IMPLANT
BNDG ELASTIC 6INX 5YD STR LF (GAUZE/BANDAGES/DRESSINGS) ×1 IMPLANT
CANISTER SUCT 1200ML W/VALVE (MISCELLANEOUS) ×1 IMPLANT
CHLORAPREP W/TINT 26 (MISCELLANEOUS) ×1 IMPLANT
COVER BACK TABLE 60X90IN (DRAPES) ×1 IMPLANT
CUFF TRNQT CYL 34X4.125X (TOURNIQUET CUFF) IMPLANT
DRAPE EXTREMITY T 121X128X90 (DISPOSABLE) ×1 IMPLANT
DRAPE OEC MINIVIEW 54X84 (DRAPES) ×1 IMPLANT
DRAPE U-SHAPE 47X51 STRL (DRAPES) ×1 IMPLANT
DRSG MEPITEL 4X7.2 (GAUZE/BANDAGES/DRESSINGS) ×1 IMPLANT
ELECTRODE REM PT RTRN 9FT ADLT (ELECTROSURGICAL) ×1 IMPLANT
GAUZE PAD ABD 8X10 STRL (GAUZE/BANDAGES/DRESSINGS) ×2 IMPLANT
GAUZE SPONGE 4X4 12PLY STRL (GAUZE/BANDAGES/DRESSINGS) ×1 IMPLANT
GLOVE BIO SURGEON STRL SZ8 (GLOVE) ×1 IMPLANT
GLOVE BIOGEL PI IND STRL 7.0 (GLOVE) IMPLANT
GLOVE BIOGEL PI IND STRL 7.5 (GLOVE) IMPLANT
GLOVE BIOGEL PI IND STRL 8 (GLOVE) ×2 IMPLANT
GLOVE ECLIPSE 8.0 STRL XLNG CF (GLOVE) ×1 IMPLANT
GLOVE SURG SS PI 7.0 STRL IVOR (GLOVE) IMPLANT
GOWN STRL REUS W/ TWL LRG LVL3 (GOWN DISPOSABLE) ×1 IMPLANT
GOWN STRL REUS W/ TWL XL LVL3 (GOWN DISPOSABLE) ×2 IMPLANT
NDL HYPO 22X1.5 SAFETY MO (MISCELLANEOUS) IMPLANT
NEEDLE HYPO 22X1.5 SAFETY MO (MISCELLANEOUS) IMPLANT
NS IRRIG 1000ML POUR BTL (IV SOLUTION) ×1 IMPLANT
PACK BASIN DAY SURGERY FS (CUSTOM PROCEDURE TRAY) ×1 IMPLANT
PAD CAST 4YDX4 CTTN HI CHSV (CAST SUPPLIES) ×1 IMPLANT
PADDING CAST ABS COTTON 4X4 ST (CAST SUPPLIES) ×1 IMPLANT
PADDING CAST COTTON 6X4 STRL (CAST SUPPLIES) ×1 IMPLANT
PENCIL SMOKE EVACUATOR (MISCELLANEOUS) ×1 IMPLANT
PLATE ACE 100DEG 6HOLE (Plate) IMPLANT
SANITIZER HAND ALTRA PUMP 550 (MISCELLANEOUS) ×1 IMPLANT
SCREW CORTICAL 3.5MM 14MM (Screw) IMPLANT
SCREW CORTICAL 3.5MM 16MM (Screw) IMPLANT
SCREW CORTICAL 3.5MM 18MM (Screw) IMPLANT
SCREW CORTICAL 3.5MM 24MM (Screw) IMPLANT
SHEET MEDIUM DRAPE 40X70 STRL (DRAPES) ×1 IMPLANT
SLEEVE SCD COMPRESS KNEE MED (STOCKING) ×1 IMPLANT
SPIKE FLUID TRANSFER (MISCELLANEOUS) IMPLANT
SPLINT PLASTER CAST FAST 5X30 (CAST SUPPLIES) ×20 IMPLANT
SPONGE T-LAP 18X18 ~~LOC~~+RFID (SPONGE) ×1 IMPLANT
STOCKINETTE 6 STRL (DRAPES) ×1 IMPLANT
SUCTION TUBE FRAZIER 10FR DISP (SUCTIONS) ×1 IMPLANT
SUT ETHILON 3 0 PS 1 (SUTURE) ×1 IMPLANT
SUT MNCRL AB 3-0 PS2 18 (SUTURE) IMPLANT
SUT VIC AB 2-0 SH 27XBRD (SUTURE) ×1 IMPLANT
SUT VICRYL 0 SH 27 (SUTURE) IMPLANT
SUTURE FIBERWR #2 38 T-5 BLUE (SUTURE) IMPLANT
SYR BULB EAR ULCER 3OZ GRN STR (SYRINGE) ×1 IMPLANT
SYR CONTROL 10ML LL (SYRINGE) IMPLANT
TOWEL GREEN STERILE FF (TOWEL DISPOSABLE) ×2 IMPLANT
TUBE CONNECTING 20X1/4 (TUBING) ×1 IMPLANT
UNDERPAD 30X36 HEAVY ABSORB (UNDERPADS AND DIAPERS) ×1 IMPLANT

## 2024-01-11 NOTE — Progress Notes (Signed)
Assisted Dr. Houser with left, popliteal, ultrasound guided block. Side rails up, monitors on throughout procedure. See vital signs in flow sheet. Tolerated Procedure well. 

## 2024-01-11 NOTE — H&P (Signed)
 Deanna Rowe is an 68 y.o. female.   Chief Complaint: Left ankle pain HPI: 68 year old female with past medical history significant for diabetes and Parkinson's disease fell last week injuring her left ankle.  She has a displaced lateral malleolus fracture and presents today for surgical treatment of this displaced and unstable left ankle injury.  Past Medical History:  Diagnosis Date   Anemia    Anxiety    Arthritis    L HIP   Asthma    Avascular necrosis of hip (HCC)    LEFT   Back pain    Chest pain    Constipation    Depression    Diabetes mellitus    Diabetes mellitus, type II (HCC)    Dry cough    Edema, lower extremity    Gait abnormality 11/18/2019   GERD (gastroesophageal reflux disease)    High cholesterol    Hypertension    Insomnia    takes Ambien  nightly   Joint pain    Kidney disease    Kidney disease    Obesity    Parkinsonism (HCC) 03/02/2017   PONV (postoperative nausea and vomiting)    RLS (restless legs syndrome) 01/15/2018   Sleep apnea    Swallowing difficulty    Thyroid  disease    TIA (transient ischemic attack) 2012   no residual problems   Urgency incontinence    Vitamin D  deficiency     Past Surgical History:  Procedure Laterality Date   DILATION AND CURETTAGE OF UTERUS     ESOPHAGEAL MANOMETRY N/A 09/03/2012   Procedure: ESOPHAGEAL MANOMETRY (EM);  Surgeon: Gladis MARLA Louder, MD;  Location: [facility];  Service: Endoscopy;  Laterality: N/A;   ESOPHAGEAL MANOMETRY N/A 06/19/2017   Procedure: ESOPHAGEAL MANOMETRY (EM);  Surgeon: Rosalie Kitchens, MD;  Location: [facility];  Service: Endoscopy;  Laterality: N/A;   EXPLORATORY LAPAROTOMY     GANGLION CYST EXCISION     JOINT REPLACEMENT  2011   rt total hip   KNEE ARTHROSCOPY     PILONIDAL CYST / SINUS EXCISION     TOTAL HIP ARTHROPLASTY Left 10/09/2013   Procedure: LEFT TOTAL HIP ARTHROPLASTY ANTERIOR APPROACH;  Surgeon: Dempsey Melodi GAILS, MD;  Location: [facility];  Service: Orthopedics;   Laterality: Left;    Family History  Problem Relation Age of Onset   High blood pressure Mother    Hyperlipidemia Mother    Heart disease Mother    Heart disease Father    Alcoholism Father    Allergic rhinitis Sister    Alcohol  abuse Brother    Alcohol  abuse Brother    Alcohol  abuse Brother    Alcohol  abuse Brother    Alzheimer's disease Maternal Aunt    Healthy Child    Diabetes Neg Hx    Angioedema Neg Hx    Asthma Neg Hx    Eczema Neg Hx    Immunodeficiency Neg Hx    Urticaria Neg Hx    Breast cancer Neg Hx    Parkinson's disease Neg Hx    Sleep apnea Neg Hx    Social History:  reports that she has never smoked. She has never used smokeless tobacco. She reports that she does not drink alcohol  and does not use drugs.  Allergies:  Allergies  Allergen Reactions   Fluticasone -Salmeterol Anaphylaxis   Codeine Nausea And Vomiting   Fetzima [Levomilnacipran] Nausea And Vomiting   Nsaids Other (See Comments)    Upset stomach    Trulicity [Dulaglutide] Nausea  And Vomiting    Significant and undesirably rapid (40 lbs) weight loss    Xanax [Alprazolam] Other (See Comments)    disoriented   Amoxicillin Rash    Has patient had a PCN reaction causing immediate rash, facial/tongue/throat swelling, SOB or lightheadedness with hypotension: Yes Has patient had a PCN reaction causing severe rash involving mucus membranes or skin necrosis: No Has patient had a PCN reaction that required hospitalization: No Has patient had a PCN reaction occurring within the last 10 years: No If all of the above answers are NO, then may proceed with Cephalosporin use.    Pseudoephedrine  Hcl Er Palpitations    No medications prior to admission.    Results for orders placed or performed during the hospital encounter of 01/11/24 (from the past 48 hours)  Basic metabolic panel per protocol     Status: Abnormal   Collection Time: 01/09/24  3:14 PM  Result Value Ref Range   Sodium 127 (L) 135 -  145 mmol/L   Potassium 5.2 (H) 3.5 - 5.1 mmol/L   Chloride 94 (L) 98 - 111 mmol/L   CO2 24 22 - 32 mmol/L   Glucose, Bld 97 70 - 99 mg/dL    Comment: Glucose reference range applies only to samples taken after fasting for at least 8 hours.   BUN 14 8 - 23 mg/dL   Creatinine, Ser 8.70 (H) 0.44 - 1.00 mg/dL   Calcium  9.0 8.9 - 10.3 mg/dL   GFR, Estimated 45 (L) >60 mL/min    Comment: (NOTE) Calculated using the CKD-EPI Creatinine Equation (2021)    Anion gap 9 5 - 15    Comment: Performed at [facility], [address], [place], [place] [address]  Basic metabolic panel     Status: Abnormal   Collection Time: 01/10/24  3:03 PM  Result Value Ref Range   Sodium 129 (L) 135 - 145 mmol/L   Potassium 4.8 3.5 - 5.1 mmol/L   Chloride 95 (L) 98 - 111 mmol/L   CO2 26 22 - 32 mmol/L   Glucose, Bld 105 (H) 70 - 99 mg/dL    Comment: Glucose reference range applies only to samples taken after fasting for at least 8 hours.   BUN 14 8 - 23 mg/dL   Creatinine, Ser 8.59 (H) 0.44 - 1.00 mg/dL   Calcium  9.1 8.9 - 10.3 mg/dL   GFR, Estimated 41 (L) >60 mL/min    Comment: (NOTE) Calculated using the CKD-EPI Creatinine Equation (2021)    Anion gap 8 5 - 15    Comment: Performed at [facility], [address] [place], [place] [address]   *Note: Due to a large number of results and/or encounters for the requested time period, some results have not been displayed. A complete set of results can be found in Results Review.   No results found.  Review of Systems no recent fever, chills, nausea, vomiting or changes in her appetite  Height 5' 6 (1.676 m), weight 84.4 kg. Physical Exam  Well-nourished well-developed woman in no apparent distress.  Alert and oriented.  Normal mood and affect.  Extraocular motions are intact.  Respirations are unlabored.  Gait is nonweightbearing on the left.  Left ankle is swollen.  Skin is healthy and intact otherwise.  Pulses are palpable.  No  lymphadenopathy.  Intact sensibility to light touch dorsal midfoot, and forefoot.  Active plantarflexion and dorsiflexion of the toes.   Assessment/Plan Left ankle displaced lateral malleolus  fracture -to the operating room today for open treatment of the left ankle lateral malleolus fracture with internal fixation.  The risks and benefits of the alternative treatment options have been discussed in detail.  The patient wishes to proceed with surgery and specifically understands risks of bleeding, infection, nerve damage, blood clots, need for additional surgery, amputation and death.   Norleen Armor, MD 2024-01-31, 8:22 AM

## 2024-01-11 NOTE — Anesthesia Procedure Notes (Signed)
 Procedure Name: LMA Insertion Date/Time: 01/11/2024 9:57 AM  Performed by: Julieanne Fairy BROCKS, CRNAPre-anesthesia Checklist: Patient identified, Emergency Drugs available, Suction available and Patient being monitored Patient Re-evaluated:Patient Re-evaluated prior to induction Oxygen Delivery Method: Circle system utilized Preoxygenation: Pre-oxygenation with 100% oxygen Induction Type: IV induction Ventilation: Mask ventilation without difficulty LMA: LMA inserted LMA Size: 4.0 Number of attempts: 1 Airway Equipment and Method: Bite block Placement Confirmation: positive ETCO2 Tube secured with: Tape Dental Injury: Teeth and Oropharynx as per pre-operative assessment

## 2024-01-11 NOTE — Op Note (Signed)
 01/11/2024  10:50 AM  PATIENT:  Deanna Rowe  68 y.o. female  PRE-OPERATIVE DIAGNOSIS: 1.  Left ankle displaced lateral malleolus fracture  POST-OPERATIVE DIAGNOSIS:  1.  Left ankle displaced lateral malleolus fracture 2.  Left ankle syndesmosis sprain   Procedure(s): 1.  Open treatment left ankle lateral malleolus fracture with internal fixation 2.  AP, mortise and lateral radiographs of the left ankle 3.  Stress examination of the left ankle under fluoroscopy. 4.  Left ankle syndesmosis repair  SURGEON:  Norleen Armor, MD  ASSISTANT: Eva Barrack, PA-C  ANESTHESIA:   General, regional  EBL:  minimal   TOURNIQUET:   Total Tourniquet Time Documented: Thigh (Left) - 26 minutes Total: Thigh (Left) - 26 minutes  COMPLICATIONS:  None apparent  DISPOSITION:  Extubated, awake and stable to recovery.  INDICATION FOR PROCEDURE: 68 year old female with past medical history significant for diabetes and Parkinson's disease injured her left ankle and fall last week.  She has a displaced lateral malleolus fracture and presents now for surgical treatment.  The risks and benefits of the alternative treatment options have been discussed in detail.  The patient wishes to proceed with surgery and specifically understands risks of bleeding, infection, nerve damage, blood clots, need for additional surgery, amputation and death.   PROCEDURE IN DETAIL:  After pre operative consent was obtained, and the correct operative site was identified, the patient was brought to the operating room and placed supine on the OR table.  Anesthesia was administered.  Pre-operative antibiotics were administered.  A surgical timeout was taken.  The left lower extremity was prepped and draped in standard sterile fashion with a tourniquet around the thigh.  The extremity was elevated, and the tourniquet was inflated to 250 mmHg.  A longitudinal incision was made over the lateral malleolus.  Dissection was carried sharply  down through the subcutaneous tissues.  The fracture site was mobilized.  Several small fragments of cartilage were removed.  Hematoma was cleared from the fracture site along with periosteum.  The anterior inferior tib-fib ligament was noted to be torn with a stump of the ligament clearly attached to the distal fibular fragment.  The fracture was reduced and held with a lobster claw clamp.  A 3.5 mm lag screw was inserted from anterior to posterior across the fracture site and was noted to have excellent purchase and compressed the fracture appropriately.  A 6-hole one third tubular plate from the [employer] small frag set was then contoured to fit the lateral malleolus.  It was secured proximally with 3 bicortical screws and distally with 2 unicortical screws.  The open hole lay directly over the fracture line.  AP, mortise and lateral radiographs confirmed appropriate reduction of the fracture in appropriate position and length of all hardware.  Stress examination was then performed.  Dorsiflexion and external rotation stress was applied and supinated forefoot.  A mortise showed subtle widening of the ankle mortise.  The syndesmosis was stabilized by repairing the AITFL with figure-of-eight sutures of Vicryl.  The wound was irrigated copiously and sprinkled with vancomycin powder.  Deep subcutaneous tissues were approximated with inverted simple sutures of 2-0 Vicryl.  Skin incision was closed with running 3-0 nylon.  Sterile dressings were applied followed by a well-padded short leg splint.  Tourniquet was released after application of dressings.  The patient was awakened from anesthesia and transported to the recovery room in stable condition.   FOLLOW UP PLAN: Nonweightbearing on the left lower extremity in a  short leg splint.  Plan nonweightbearing immobilization for 6 weeks postop.  Xarelto  for DVT prophylaxis.   RADIOGRAPHS: AP, mortise and lateral radiographs of the left ankle are obtained  intraoperatively.  These show interval reduction and fixation of the lateral malleolus fracture.  Hardware is appropriately positioned in the appropriate lengths.  No other acute injuries are noted.    Justin Ollis PA-C was present and scrubbed for the duration of the operative case. His assistance was essential in positioning the patient, prepping and draping, gaining and maintaining exposure, performing the operation, closing and dressing the wounds and applying the splint.

## 2024-01-11 NOTE — Anesthesia Postprocedure Evaluation (Signed)
 Anesthesia Post Note  Patient: Deanna Rowe  Procedure(s) Performed: OPEN REDUCTION INTERNAL FIXATION (ORIF) LEFT ANKLE LATERAL MALLEOLUS (Left: Ankle) SYNDESMOSIS REPAIR, ANKLE (Left: Ankle)     Patient location during evaluation: PACU Anesthesia Type: General and Regional Level of consciousness: awake and alert Pain management: pain level controlled Vital Signs Assessment: post-procedure vital signs reviewed and stable Respiratory status: spontaneous breathing, nonlabored ventilation, respiratory function stable and patient connected to nasal cannula oxygen Cardiovascular status: blood pressure returned to baseline and stable Postop Assessment: no apparent nausea or vomiting Anesthetic complications: no   No notable events documented.  Last Vitals:  Vitals:   01/11/24 1130 01/11/24 1215  BP: 132/76 (!) 144/78  Pulse: 63   Resp: 19 16  Temp:  (!) 36.2 C  SpO2: 95% 94%    Last Pain:  Vitals:   01/11/24 1215  TempSrc:   PainSc: 0-No pain                 Garnette [name]

## 2024-01-11 NOTE — Transfer of Care (Signed)
 Immediate Anesthesia Transfer of Care Note  Patient: Deanna Rowe  Procedure(s) Performed: OPEN REDUCTION INTERNAL FIXATION (ORIF) LEFT ANKLE LATERAL MALLEOLUS (Left: Ankle) SYNDESMOSIS REPAIR, ANKLE (Left: Ankle)  Patient Location: PACU  Anesthesia Type:GA combined with regional for post-op pain  Level of Consciousness: sedated  Airway & Oxygen Therapy: Patient Spontanous Breathing and Patient connected to face mask oxygen  Post-op Assessment: Report given to RN and Post -op Vital signs reviewed and stable  Post vital signs: Reviewed and stable  Last Vitals:  Vitals Value Taken Time  BP    Temp    Pulse 62 01/11/24 10:51  Resp 12 01/11/24 10:51  SpO2 100 % 01/11/24 10:51  Vitals shown include unfiled device data.  Last Pain:  Vitals:   01/11/24 0923  TempSrc: Temporal  PainSc: 7       Patients Stated Pain Goal: 5 (01/11/24 9076)  Complications: No notable events documented.

## 2024-01-11 NOTE — Discharge Instructions (Addendum)
 Norleen Armor, MD [facility]  Please read the following information regarding your care after surgery.  Medications  You only need a prescription for the narcotic pain medicine (ex. oxycodone , Percocet, Norco).  All of the other medicines listed below are available over the counter. ? Aleve  2 pills twice a day for the first 3 days after surgery. ? acetominophen (Tylenol ) 650 mg every 4-6 hours as you need for minor to moderate pain ? oxycodone  as prescribed for severe pain  Narcotic pain medicine (ex. oxycodone , Percocet, Vicodin) will cause constipation.  To prevent this problem, take the following medicines while you are taking any pain medicine. ? docusate sodium  (Colace) 100 mg twice a day ? senna (Senokot) 2 tablets twice a day  ? To help prevent blood clots, take Xarelto  as prescribed for two weeks after surgery.  You should also get up every hour while you are awake to move around.    Weight Bearing ? Do not bear any weight on the operated leg or foot.  Cast / Splint / Dressing ? Keep your splint, cast or dressing clean and dry.  Don't put anything (coat hanger, pencil, etc) down inside of it.  If it gets damp, use a hair dryer on the cool setting to dry it.  If it gets soaked, call the office to schedule an appointment for a cast change.     After your dressing, cast or splint is removed; you may shower, but do not soak or scrub the wound.  Allow the water to run over it, and then gently pat it dry.  Swelling It is normal for you to have swelling where you had surgery.  To reduce swelling and pain, keep your toes above your nose for at least 3 days after surgery.  It may be necessary to keep your foot or leg elevated for several weeks.  If it hurts, it should be elevated.  Follow Up Call my office at [phone] when you are discharged from the hospital or surgery center to schedule an appointment to be seen two weeks after surgery.  Call my office at [phone] if you  develop a fever >101.5 F, nausea, vomiting, bleeding from the surgical site or severe pain.     Post Anesthesia Home Care Instructions  Activity: Get plenty of rest for the remainder of the day. A responsible individual must stay with you for 24 hours following the procedure.  For the next 24 hours, DO NOT: -Drive a car -Advertising copywriter -Drink alcoholic beverages -Take any medication unless instructed by your physician -Make any legal decisions or sign important papers.  Meals: Start with liquid foods such as gelatin or soup. Progress to regular foods as tolerated. Avoid greasy, spicy, heavy foods. If nausea and/or vomiting occur, drink only clear liquids until the nausea and/or vomiting subsides. Call your physician if vomiting continues.  Special Instructions/Symptoms: Your throat may feel dry or sore from the anesthesia or the breathing tube placed in your throat during surgery. If this causes discomfort, gargle with warm salt water. The discomfort should disappear within 24 hours.  If you had a scopolamine patch placed behind your ear for the management of post- operative nausea and/or vomiting:  1. The medication in the patch is effective for 72 hours, after which it should be removed.  Wrap patch in a tissue and discard in the trash. Wash hands thoroughly with soap and water. 2. You may remove the patch earlier than 72 hours if you experience unpleasant side effects  which may include dry mouth, dizziness or visual disturbances. 3. Avoid touching the patch. Wash your hands with soap and water after contact with the patch.    Regional Anesthesia Blocks  1. You may not be able to move or feel the blocked extremity after a regional anesthetic block. This may last may last from 3-48 hours after placement, but it will go away. The length of time depends on the medication injected and your individual response to the medication. As the nerves start to wake up, you may experience  tingling as the movement and feeling returns to your extremity. If the numbness and inability to move your extremity has not gone away after 48 hours, please call your surgeon.   2. The extremity that is blocked will need to be protected until the numbness is gone and the strength has returned. Because you cannot feel it, you will need to take extra care to avoid injury. Because it may be weak, you may have difficulty moving it or using it. You may not know what position it is in without looking at it while the block is in effect.  3. For blocks in the legs and feet, returning to weight bearing and walking needs to be done carefully. You will need to wait until the numbness is entirely gone and the strength has returned. You should be able to move your leg and foot normally before you try and bear weight or walk. You will need someone to be with you when you first try to ensure you do not fall and possibly risk injury.  4. Bruising and tenderness at the needle site are common side effects and will resolve in a few days.  5. Persistent numbness or new problems with movement should be communicated to the surgeon or the [facility] [phone] [facility] [phone]).

## 2024-01-12 ENCOUNTER — Encounter (HOSPITAL_BASED_OUTPATIENT_CLINIC_OR_DEPARTMENT_OTHER): Payer: Self-pay | Admitting: Orthopedic Surgery

## 2024-01-17 ENCOUNTER — Other Ambulatory Visit: Payer: Self-pay

## 2024-01-19 ENCOUNTER — Other Ambulatory Visit (HOSPITAL_COMMUNITY): Payer: Self-pay

## 2024-01-22 ENCOUNTER — Other Ambulatory Visit: Payer: Self-pay

## 2024-01-22 ENCOUNTER — Ambulatory Visit (INDEPENDENT_AMBULATORY_CARE_PROVIDER_SITE_OTHER): Admitting: Family Medicine

## 2024-01-22 NOTE — Progress Notes (Signed)
 Specialty Pharmacy Refill Coordination Note  Deanna Rowe is a 68 y.o. female contacted today regarding refills of specialty medication(s) Omalizumab  (Xolair )   Patient requested Pickup at [facility] at [place] date: 01/23/24   Medication will be filled on 01/22/24.

## 2024-01-23 ENCOUNTER — Other Ambulatory Visit (HOSPITAL_BASED_OUTPATIENT_CLINIC_OR_DEPARTMENT_OTHER): Payer: Self-pay | Admitting: Student

## 2024-01-23 ENCOUNTER — Other Ambulatory Visit (HOSPITAL_BASED_OUTPATIENT_CLINIC_OR_DEPARTMENT_OTHER): Payer: Self-pay

## 2024-01-23 MED ORDER — RIVAROXABAN 10 MG PO TABS
10.0000 mg | ORAL_TABLET | Freq: Every day | ORAL | 0 refills | Status: DC
  Filled 2024-01-23: qty 14, 14d supply, fill #0

## 2024-01-25 ENCOUNTER — Other Ambulatory Visit (HOSPITAL_COMMUNITY): Payer: Self-pay

## 2024-01-25 ENCOUNTER — Other Ambulatory Visit (HOSPITAL_BASED_OUTPATIENT_CLINIC_OR_DEPARTMENT_OTHER): Payer: Self-pay

## 2024-01-25 DIAGNOSIS — S8262XA Displaced fracture of lateral malleolus of left fibula, initial encounter for closed fracture: Secondary | ICD-10-CM | POA: Diagnosis not present

## 2024-01-25 DIAGNOSIS — Z4889 Encounter for other specified surgical aftercare: Secondary | ICD-10-CM | POA: Diagnosis not present

## 2024-01-25 DIAGNOSIS — S8262XD Displaced fracture of lateral malleolus of left fibula, subsequent encounter for closed fracture with routine healing: Secondary | ICD-10-CM | POA: Diagnosis not present

## 2024-01-25 MED ORDER — HYDROCODONE-ACETAMINOPHEN 5-325 MG PO TABS
1.0000 | ORAL_TABLET | Freq: Four times a day (QID) | ORAL | 0 refills | Status: AC
  Filled 2024-01-25: qty 20, 5d supply, fill #0

## 2024-01-26 ENCOUNTER — Other Ambulatory Visit (HOSPITAL_BASED_OUTPATIENT_CLINIC_OR_DEPARTMENT_OTHER): Payer: Self-pay

## 2024-01-26 MED ORDER — OXYCODONE HCL 5 MG PO TABS
5.0000 mg | ORAL_TABLET | Freq: Three times a day (TID) | ORAL | 0 refills | Status: AC
  Filled 2024-01-26: qty 20, 7d supply, fill #0

## 2024-01-31 DIAGNOSIS — Z4789 Encounter for other orthopedic aftercare: Secondary | ICD-10-CM | POA: Diagnosis not present

## 2024-02-06 ENCOUNTER — Other Ambulatory Visit (HOSPITAL_BASED_OUTPATIENT_CLINIC_OR_DEPARTMENT_OTHER): Payer: Self-pay

## 2024-02-06 ENCOUNTER — Other Ambulatory Visit: Payer: Self-pay

## 2024-02-06 ENCOUNTER — Other Ambulatory Visit (HOSPITAL_BASED_OUTPATIENT_CLINIC_OR_DEPARTMENT_OTHER): Payer: Self-pay | Admitting: Student

## 2024-02-06 MED ORDER — RIVAROXABAN 10 MG PO TABS
10.0000 mg | ORAL_TABLET | Freq: Every day | ORAL | 0 refills | Status: DC
  Filled 2024-02-06: qty 14, 14d supply, fill #0

## 2024-02-12 ENCOUNTER — Other Ambulatory Visit (HOSPITAL_BASED_OUTPATIENT_CLINIC_OR_DEPARTMENT_OTHER): Payer: Self-pay

## 2024-02-12 MED ORDER — GABAPENTIN 300 MG PO CAPS
300.0000 mg | ORAL_CAPSULE | Freq: Three times a day (TID) | ORAL | 1 refills | Status: AC
  Filled 2024-02-12: qty 270, 90d supply, fill #0
  Filled 2024-05-06: qty 270, 90d supply, fill #1

## 2024-02-15 ENCOUNTER — Other Ambulatory Visit: Payer: Self-pay

## 2024-02-19 ENCOUNTER — Ambulatory Visit (INDEPENDENT_AMBULATORY_CARE_PROVIDER_SITE_OTHER): Payer: Self-pay | Admitting: Family Medicine

## 2024-02-19 ENCOUNTER — Encounter (INDEPENDENT_AMBULATORY_CARE_PROVIDER_SITE_OTHER): Payer: Self-pay | Admitting: Family Medicine

## 2024-02-19 ENCOUNTER — Other Ambulatory Visit (HOSPITAL_BASED_OUTPATIENT_CLINIC_OR_DEPARTMENT_OTHER): Payer: Self-pay

## 2024-02-19 ENCOUNTER — Other Ambulatory Visit (HOSPITAL_COMMUNITY): Payer: Self-pay

## 2024-02-19 VITALS — BP 111/69 | HR 64 | Temp 97.8°F | Ht 66.0 in | Wt 193.0 lb

## 2024-02-19 DIAGNOSIS — S82892S Other fracture of left lower leg, sequela: Secondary | ICD-10-CM

## 2024-02-19 DIAGNOSIS — S82892A Other fracture of left lower leg, initial encounter for closed fracture: Secondary | ICD-10-CM | POA: Insufficient documentation

## 2024-02-19 DIAGNOSIS — E1169 Type 2 diabetes mellitus with other specified complication: Secondary | ICD-10-CM

## 2024-02-19 DIAGNOSIS — S8262XA Displaced fracture of lateral malleolus of left fibula, initial encounter for closed fracture: Secondary | ICD-10-CM | POA: Insufficient documentation

## 2024-02-19 DIAGNOSIS — G20A1 Parkinson's disease without dyskinesia, without mention of fluctuations: Secondary | ICD-10-CM | POA: Diagnosis not present

## 2024-02-19 DIAGNOSIS — Z794 Long term (current) use of insulin: Secondary | ICD-10-CM

## 2024-02-19 DIAGNOSIS — Z6831 Body mass index (BMI) 31.0-31.9, adult: Secondary | ICD-10-CM | POA: Diagnosis not present

## 2024-02-19 DIAGNOSIS — E669 Obesity, unspecified: Secondary | ICD-10-CM

## 2024-02-19 DIAGNOSIS — Z9889 Other specified postprocedural states: Secondary | ICD-10-CM

## 2024-02-19 DIAGNOSIS — R296 Repeated falls: Secondary | ICD-10-CM | POA: Insufficient documentation

## 2024-02-19 DIAGNOSIS — E119 Type 2 diabetes mellitus without complications: Secondary | ICD-10-CM

## 2024-02-19 MED ORDER — TIRZEPATIDE 15 MG/0.5ML ~~LOC~~ SOAJ
15.0000 mg | SUBCUTANEOUS | 0 refills | Status: DC
  Filled 2024-02-19 – 2024-03-14 (×2): qty 6, 84d supply, fill #0

## 2024-02-19 NOTE — Addendum Note (Signed)
 Addended by: LAFE BAKER CROME on: 02/19/2024 02:53 PM   Modules accepted: Orders

## 2024-02-19 NOTE — Progress Notes (Signed)
 Office: [phone]  /  Fax: [phone]  WEIGHT SUMMARY AND BIOMETRICS  Anthropometric Measurements Height: 5' 6 (1.676 m) Weight: 193 lb (87.5 kg) (with cast on) BMI (Calculated): 31.17 Weight at Last Visit: 193 lb Weight Lost Since Last Visit: 0 Weight Gained Since Last Visit: 8 lb Starting Weight: 237 lb Total Weight Loss (lbs): 44 lb (20 kg) Peak Weight: 240 lb   No data recorded Other Clinical Data Fasting: no Labs: no Today's Visit #: 7 Starting Date: 04/16/19    Chief Complaint: OBESITY    History of Present Illness Deanna Rowe is a 68 year old female with obesity who presents for obesity treatment management. She is accompanied by her daughter.  She has been following the category two eating plan about fifty percent of the time, focusing on increasing her intake of fruits, vegetables, and protein, while ensuring adequate hydration and not skipping meals. She exercises with weights for fifteen minutes two days a week. Despite these efforts, she has experienced an eight-pound weight gain since her last visit two months ago, which she attributes to being in a cast following ankle surgery.  She sustained an ankle fracture last month and underwent surgery on October 9th. Currently, she is in a cast, which has limited her mobility and contributed to her weight gain. The injury occurred when she fell while trying to get back into bed after a previous fall on a flight of stairs. She reports difficulty with mobility and has experienced more falls since a change in her medication regimen.  She has Parkinson's disease and uses a tall Parkinson's walker about seventy-five percent of the time. She experiences difficulty getting up, especially when reaching for objects like a remote control, which has led to falls. Her daughter assists her, but she finds it challenging to rely on others for help.  She is currently taking Mounjaro  15 mg without experiencing nausea. She is  also taking vitamin D  to aid in bone healing. Her last hemoglobin A1c was 6.1.  She feels frustrated with her current limitations and the impact on her physical activity, noting weakness in her arms and legs since her surgery. She is considering alternative exercises like chair yoga to maintain strength while in a cast.      PHYSICAL EXAM:  Blood pressure 111/69, pulse 64, temperature 97.8 F (36.6 C), height 5' 6 (1.676 m), weight 193 lb (87.5 kg), SpO2 100%. Body mass index is 31.15 kg/m.  DIAGNOSTIC DATA REVIEWED:  BMET    Component Value Date/Time   NA 129 (L) 01/10/2024 1503   NA 138 04/05/2022 0942   NA 142 01/12/2017 0914   K 4.8 01/10/2024 1503   K 4.2 01/12/2017 0914   CL 95 (L) 01/10/2024 1503   CO2 26 01/10/2024 1503   CO2 28 01/12/2017 0914   GLUCOSE 105 (H) 01/10/2024 1503   GLUCOSE 142 (H) 01/12/2017 0914   BUN 14 01/10/2024 1503   BUN 19 04/05/2022 0942   BUN 12.0 01/12/2017 0914   CREATININE 1.40 (H) 01/10/2024 1503   CREATININE 1.58 (H) 10/12/2018 1121   CREATININE 1.3 (H) 01/12/2017 0914   CALCIUM  9.1 01/10/2024 1503   CALCIUM  9.6 01/12/2017 0914   GFRNONAA 41 (L) 01/10/2024 1503   GFRNONAA 34 (L) 10/12/2018 1121   GFRAA 44 (L) 03/02/2020 0920   GFRAA 40 (L) 10/12/2018 1121   Lab Results  Component Value Date   HGBA1C 7.9 (H) 04/16/2019   HGBA1C 7.3 09/04/2014   Lab Results  Component Value Date   INSULIN  12.3 04/16/2019   Lab Results  Component Value Date   TSH 1.200 04/05/2022   CBC    Component Value Date/Time   WBC 5.4 05/15/2023 1149   WBC 6.9 08/27/2022 2108   RBC 3.69 (L) 05/15/2023 1149   RBC 3.54 (L) 08/27/2022 2108   HGB 12.3 05/15/2023 1149   HGB 11.4 (L) 01/12/2017 0913   HCT 36.4 05/15/2023 1149   HCT 35.2 01/12/2017 0913   PLT 242 05/15/2023 1149   MCV 99 (H) 05/15/2023 1149   MCV 100.9 01/12/2017 0913   MCH 33.3 (H) 05/15/2023 1149   MCH 33.6 08/27/2022 2108   MCHC 33.8 05/15/2023 1149   MCHC 34.1 08/27/2022 2108    RDW 12.8 05/15/2023 1149   RDW 14.1 01/12/2017 0913   Iron Studies No results found for: IRON, TIBC, FERRITIN, IRONPCTSAT Lipid Panel     Component Value Date/Time   CHOL 155 04/16/2019 1451   TRIG 71 04/16/2019 1451   HDL 84 04/16/2019 1451   LDLCALC 57 04/16/2019 1451   Hepatic Function Panel     Component Value Date/Time   PROT 7.2 08/27/2022 2108   PROT 7.1 04/05/2022 0942   PROT 7.2 01/12/2017 0914   ALBUMIN 3.5 08/27/2022 2108   ALBUMIN 4.3 04/05/2022 0942   ALBUMIN 3.5 01/12/2017 0914   AST 18 08/27/2022 2108   AST 16 10/12/2018 1121   AST 19 01/12/2017 0914   ALT 5 08/27/2022 2108   ALT <6 10/12/2018 1121   ALT 18 01/12/2017 0914   ALKPHOS 47 08/27/2022 2108   ALKPHOS 43 01/12/2017 0914   BILITOT 0.4 08/27/2022 2108   BILITOT 0.3 04/05/2022 0942   BILITOT 0.2 (L) 10/12/2018 1121   BILITOT 0.28 01/12/2017 0914      Component Value Date/Time   TSH 1.200 04/05/2022 0942   Nutritional Lab Results  Component Value Date   VD25OH 44.8 12/17/2019   VD25OH 40.9 04/16/2019     Assessment and Plan Assessment & Plan Obesity management Following the category two eating plan about 50% of the time. Working on increasing fruits, vegetables, and protein intake. Hydrating adequately and not skipping meals. Exercising with weights for 15 minutes, two days a week. Weight increased by 8 pounds since last visit, likely due to cast from recent ankle surgery. - Continue category two eating plan - Encouraged increased intake of fruits, vegetables, and protein - Encouraged adequate hydration and regular meals - Continue weight exercises as tolerated - Discussed strategies for managing weight gain during holidays, such as Thanksgiving and December  Type 2 diabetes mellitus Hemoglobin A1c was 6.1, as checked by Dr. Faythe. Current sedentary lifestyle due to ankle fracture may affect glucose control. - Continue monitoring hemoglobin A1c with Dr. Faythe - Encouraged  maintaining a healthy diet and exercise routine as tolerated  Right ankle fracture, postoperative Postoperative status following right ankle fracture surgery on October 9th. Currently in a cast. Weight gain likely due to cast. Follow-up with orthopedic scheduled for Wednesday to determine cast removal timeline. Vitamin D  supplementation ongoing to aid bone healing. - Continue vitamin D  supplementation - Follow up with orthopedic on Wednesday to assess cast removal timeline - Provided note for gym restrictions until February or March, depending on ankle healing  Parkinson's disease with history of falls and muscle weakness History of falls and muscle weakness associated with Parkinson's disease. Recent falls possibly related to medication changes. Uses a Parkinson's walker 75% of the time. Concerns about unsteadiness  when getting up and reaching for objects. Discussion about strategies to prevent falls and maintain strength, including chair yoga and arm exercises. - Consider chair yoga exercises to maintain strength and balance - Encouraged use of Parkinson's walker for stability - Discussed strategies to prevent falls, such as placing objects within reach - Encouraged communication with healthcare providers about medication management to prevent falls  Verdie was counseled on the importance of maintaining healthy lifestyle habits, including balanced nutrition, regular physical activity, and behavioral modifications, while taking antiobesity medication.  Patient verbalized understanding that medication is an adjunct to, not a replacement for, lifestyle changes and that the long-term success and weight maintenance depend on continued adherence to these strategies.   Carletha was informed of the importance of frequent follow up visits to maximize her success with intensive lifestyle modifications for her obesity and obesity related health conditions as recommended by USPSTF and CMS  guidelines   Louann Penton, MD

## 2024-02-20 ENCOUNTER — Other Ambulatory Visit (HOSPITAL_BASED_OUTPATIENT_CLINIC_OR_DEPARTMENT_OTHER): Payer: Self-pay

## 2024-02-20 ENCOUNTER — Other Ambulatory Visit (HOSPITAL_BASED_OUTPATIENT_CLINIC_OR_DEPARTMENT_OTHER): Payer: Self-pay | Admitting: Student

## 2024-02-21 ENCOUNTER — Other Ambulatory Visit (HOSPITAL_BASED_OUTPATIENT_CLINIC_OR_DEPARTMENT_OTHER): Payer: Self-pay

## 2024-02-21 ENCOUNTER — Telehealth (INDEPENDENT_AMBULATORY_CARE_PROVIDER_SITE_OTHER): Payer: Self-pay

## 2024-02-21 ENCOUNTER — Other Ambulatory Visit: Payer: Self-pay

## 2024-02-21 DIAGNOSIS — S8262XA Displaced fracture of lateral malleolus of left fibula, initial encounter for closed fracture: Secondary | ICD-10-CM | POA: Diagnosis not present

## 2024-02-21 DIAGNOSIS — Z4889 Encounter for other specified surgical aftercare: Secondary | ICD-10-CM | POA: Diagnosis not present

## 2024-02-21 MED ORDER — RIVAROXABAN 10 MG PO TABS
10.0000 mg | ORAL_TABLET | Freq: Every day | ORAL | 0 refills | Status: DC
  Filled 2024-02-21: qty 14, 14d supply, fill #0

## 2024-02-21 NOTE — Telephone Encounter (Signed)
 Called and spoke with medical records for lab results.  Spoke with Katheryn, she will fax over.

## 2024-02-21 NOTE — Progress Notes (Signed)
 Specialty Pharmacy Refill Coordination Note  Deanna Rowe is a 68 y.o. female contacted today regarding refills of specialty medication(s) Omalizumab  (Xolair )   Patient requested Pickup at [facility] at [place] date: 02/22/24   Medication will be filled on: 02/22/24

## 2024-02-22 ENCOUNTER — Other Ambulatory Visit (INDEPENDENT_AMBULATORY_CARE_PROVIDER_SITE_OTHER): Payer: Self-pay

## 2024-02-22 ENCOUNTER — Other Ambulatory Visit (HOSPITAL_COMMUNITY): Payer: Self-pay

## 2024-02-22 MED ORDER — BELSOMRA 10 MG PO TABS
10.0000 mg | ORAL_TABLET | Freq: Every day | ORAL | 2 refills | Status: AC
  Filled 2024-02-22 – 2024-02-23 (×2): qty 30, 30d supply, fill #0

## 2024-02-22 NOTE — Telephone Encounter (Signed)
Labs received and abstracted.

## 2024-02-23 ENCOUNTER — Other Ambulatory Visit (HOSPITAL_BASED_OUTPATIENT_CLINIC_OR_DEPARTMENT_OTHER): Payer: Self-pay

## 2024-02-23 ENCOUNTER — Other Ambulatory Visit (HOSPITAL_COMMUNITY): Payer: Self-pay

## 2024-02-28 DIAGNOSIS — E1122 Type 2 diabetes mellitus with diabetic chronic kidney disease: Secondary | ICD-10-CM | POA: Diagnosis not present

## 2024-02-28 DIAGNOSIS — N1832 Chronic kidney disease, stage 3b: Secondary | ICD-10-CM | POA: Diagnosis not present

## 2024-02-28 DIAGNOSIS — Z0000 Encounter for general adult medical examination without abnormal findings: Secondary | ICD-10-CM | POA: Diagnosis not present

## 2024-02-28 DIAGNOSIS — E7800 Pure hypercholesterolemia, unspecified: Secondary | ICD-10-CM | POA: Diagnosis not present

## 2024-02-28 DIAGNOSIS — I509 Heart failure, unspecified: Secondary | ICD-10-CM | POA: Diagnosis not present

## 2024-02-28 DIAGNOSIS — E559 Vitamin D deficiency, unspecified: Secondary | ICD-10-CM | POA: Diagnosis not present

## 2024-02-28 DIAGNOSIS — J4540 Moderate persistent asthma, uncomplicated: Secondary | ICD-10-CM | POA: Diagnosis not present

## 2024-02-28 DIAGNOSIS — K219 Gastro-esophageal reflux disease without esophagitis: Secondary | ICD-10-CM | POA: Diagnosis not present

## 2024-02-28 DIAGNOSIS — J301 Allergic rhinitis due to pollen: Secondary | ICD-10-CM | POA: Diagnosis not present

## 2024-02-28 DIAGNOSIS — I10 Essential (primary) hypertension: Secondary | ICD-10-CM | POA: Diagnosis not present

## 2024-02-28 DIAGNOSIS — I700 Atherosclerosis of aorta: Secondary | ICD-10-CM | POA: Diagnosis not present

## 2024-03-01 ENCOUNTER — Other Ambulatory Visit (HOSPITAL_BASED_OUTPATIENT_CLINIC_OR_DEPARTMENT_OTHER): Payer: Self-pay | Admitting: Student

## 2024-03-02 DIAGNOSIS — R112 Nausea with vomiting, unspecified: Secondary | ICD-10-CM | POA: Diagnosis not present

## 2024-03-02 DIAGNOSIS — R1013 Epigastric pain: Secondary | ICD-10-CM | POA: Diagnosis not present

## 2024-03-04 ENCOUNTER — Other Ambulatory Visit (HOSPITAL_BASED_OUTPATIENT_CLINIC_OR_DEPARTMENT_OTHER): Payer: Self-pay

## 2024-03-04 MED ORDER — RIVAROXABAN 10 MG PO TABS
10.0000 mg | ORAL_TABLET | Freq: Every day | ORAL | 0 refills | Status: AC
  Filled 2024-03-04: qty 14, 14d supply, fill #0

## 2024-03-05 ENCOUNTER — Other Ambulatory Visit (HOSPITAL_BASED_OUTPATIENT_CLINIC_OR_DEPARTMENT_OTHER): Payer: Self-pay

## 2024-03-07 DIAGNOSIS — K1370 Unspecified lesions of oral mucosa: Secondary | ICD-10-CM | POA: Diagnosis not present

## 2024-03-08 ENCOUNTER — Other Ambulatory Visit (HOSPITAL_COMMUNITY): Payer: Self-pay

## 2024-03-08 ENCOUNTER — Other Ambulatory Visit (HOSPITAL_BASED_OUTPATIENT_CLINIC_OR_DEPARTMENT_OTHER): Payer: Self-pay

## 2024-03-08 MED ORDER — DAYVIGO 10 MG PO TABS
10.0000 mg | ORAL_TABLET | Freq: Every day | ORAL | 1 refills | Status: DC
  Filled 2024-03-08: qty 30, 30d supply, fill #0
  Filled 2024-04-09: qty 30, 30d supply, fill #1

## 2024-03-11 ENCOUNTER — Other Ambulatory Visit (HOSPITAL_COMMUNITY): Payer: Self-pay

## 2024-03-12 ENCOUNTER — Other Ambulatory Visit: Payer: Self-pay

## 2024-03-12 ENCOUNTER — Other Ambulatory Visit (HOSPITAL_BASED_OUTPATIENT_CLINIC_OR_DEPARTMENT_OTHER): Payer: Self-pay

## 2024-03-12 ENCOUNTER — Other Ambulatory Visit (HOSPITAL_COMMUNITY): Payer: Self-pay

## 2024-03-12 DIAGNOSIS — N1831 Chronic kidney disease, stage 3a: Secondary | ICD-10-CM | POA: Diagnosis not present

## 2024-03-12 DIAGNOSIS — Z13820 Encounter for screening for osteoporosis: Secondary | ICD-10-CM | POA: Diagnosis not present

## 2024-03-12 DIAGNOSIS — E538 Deficiency of other specified B group vitamins: Secondary | ICD-10-CM | POA: Diagnosis not present

## 2024-03-12 DIAGNOSIS — E1151 Type 2 diabetes mellitus with diabetic peripheral angiopathy without gangrene: Secondary | ICD-10-CM | POA: Diagnosis not present

## 2024-03-12 DIAGNOSIS — E042 Nontoxic multinodular goiter: Secondary | ICD-10-CM | POA: Diagnosis not present

## 2024-03-12 DIAGNOSIS — Z9889 Other specified postprocedural states: Secondary | ICD-10-CM | POA: Diagnosis not present

## 2024-03-12 DIAGNOSIS — E113299 Type 2 diabetes mellitus with mild nonproliferative diabetic retinopathy without macular edema, unspecified eye: Secondary | ICD-10-CM | POA: Diagnosis not present

## 2024-03-12 DIAGNOSIS — I10 Essential (primary) hypertension: Secondary | ICD-10-CM | POA: Diagnosis not present

## 2024-03-14 ENCOUNTER — Other Ambulatory Visit (HOSPITAL_BASED_OUTPATIENT_CLINIC_OR_DEPARTMENT_OTHER): Payer: Self-pay

## 2024-03-18 ENCOUNTER — Other Ambulatory Visit: Payer: Self-pay

## 2024-03-18 ENCOUNTER — Other Ambulatory Visit (HOSPITAL_BASED_OUTPATIENT_CLINIC_OR_DEPARTMENT_OTHER): Payer: Self-pay

## 2024-03-20 ENCOUNTER — Other Ambulatory Visit: Payer: Self-pay

## 2024-03-20 DIAGNOSIS — Z4789 Encounter for other orthopedic aftercare: Secondary | ICD-10-CM | POA: Diagnosis not present

## 2024-03-20 DIAGNOSIS — S8262XD Displaced fracture of lateral malleolus of left fibula, subsequent encounter for closed fracture with routine healing: Secondary | ICD-10-CM | POA: Diagnosis not present

## 2024-03-22 ENCOUNTER — Other Ambulatory Visit: Payer: Self-pay

## 2024-03-22 ENCOUNTER — Other Ambulatory Visit: Payer: Self-pay | Admitting: Pharmacy Technician

## 2024-03-22 ENCOUNTER — Other Ambulatory Visit (HOSPITAL_COMMUNITY): Payer: Self-pay

## 2024-03-22 NOTE — Progress Notes (Signed)
 Specialty Pharmacy Refill Coordination Note  Deanna Rowe is a 68 y.o. female contacted today regarding refills of specialty medication(s) Omalizumab  (Xolair )   Patient requested Pickup at [facility] at [place] date: 04/01/24   Medication will be filled on: 03/29/24

## 2024-03-29 ENCOUNTER — Other Ambulatory Visit: Payer: Self-pay

## 2024-04-02 ENCOUNTER — Other Ambulatory Visit: Payer: Self-pay

## 2024-04-03 ENCOUNTER — Other Ambulatory Visit (HOSPITAL_BASED_OUTPATIENT_CLINIC_OR_DEPARTMENT_OTHER): Payer: Self-pay

## 2024-04-03 ENCOUNTER — Other Ambulatory Visit: Payer: Self-pay

## 2024-04-03 MED ORDER — AMLODIPINE BESYLATE 5 MG PO TABS
5.0000 mg | ORAL_TABLET | Freq: Every day | ORAL | 0 refills | Status: AC
  Filled 2024-04-03: qty 90, 90d supply, fill #0

## 2024-04-09 ENCOUNTER — Other Ambulatory Visit: Payer: Self-pay

## 2024-04-10 ENCOUNTER — Other Ambulatory Visit (HOSPITAL_BASED_OUTPATIENT_CLINIC_OR_DEPARTMENT_OTHER): Payer: Self-pay

## 2024-04-10 MED ORDER — BUPROPION HCL ER (XL) 150 MG PO TB24
450.0000 mg | ORAL_TABLET | Freq: Every morning | ORAL | 1 refills | Status: AC
  Filled 2024-04-10 – 2024-04-11 (×2): qty 270, 90d supply, fill #0

## 2024-04-10 MED ORDER — DAYVIGO 10 MG PO TABS
10.0000 mg | ORAL_TABLET | Freq: Every day | ORAL | 1 refills | Status: AC
  Filled 2024-04-10: qty 30, 30d supply, fill #0

## 2024-04-10 MED ORDER — REXULTI 2 MG PO TABS: 2.0000 mg | ORAL_TABLET | Freq: Every morning | ORAL | 1 refills | Status: AC

## 2024-04-10 MED ORDER — TRINTELLIX 20 MG PO TABS
20.0000 mg | ORAL_TABLET | Freq: Every morning | ORAL | 1 refills | Status: AC
  Filled 2024-04-11: qty 90, 90d supply, fill #0

## 2024-04-10 MED ORDER — LORAZEPAM 2 MG PO TABS: 2.0000 mg | ORAL_TABLET | Freq: Three times a day (TID) | ORAL | 1 refills | Status: AC

## 2024-04-10 MED ORDER — ROPINIROLE HCL 2 MG PO TABS
2.0000 mg | ORAL_TABLET | Freq: Two times a day (BID) | ORAL | 1 refills | Status: AC
  Filled 2024-04-10: qty 360, 180d supply, fill #0

## 2024-04-11 ENCOUNTER — Other Ambulatory Visit (HOSPITAL_BASED_OUTPATIENT_CLINIC_OR_DEPARTMENT_OTHER): Payer: Self-pay

## 2024-04-12 ENCOUNTER — Other Ambulatory Visit (HOSPITAL_COMMUNITY): Payer: Self-pay

## 2024-04-12 ENCOUNTER — Other Ambulatory Visit (HOSPITAL_BASED_OUTPATIENT_CLINIC_OR_DEPARTMENT_OTHER): Payer: Self-pay

## 2024-04-15 ENCOUNTER — Encounter: Payer: Self-pay | Admitting: Adult Health

## 2024-04-15 ENCOUNTER — Ambulatory Visit: Admitting: Adult Health

## 2024-04-15 VITALS — BP 116/71 | HR 67 | Ht 66.0 in | Wt 197.8 lb

## 2024-04-15 DIAGNOSIS — G20C Parkinsonism, unspecified: Secondary | ICD-10-CM | POA: Diagnosis not present

## 2024-04-15 NOTE — Patient Instructions (Signed)
 Your Plan:  Continue Sinemet  CR 5 times a day We will discuss medication with Dr. Buck     Thank you for coming to see us  at [facility]. I hope we have been able to provide you high quality care today.  You may receive a patient satisfaction survey over the next few weeks. We would appreciate your feedback and comments so that we may continue to improve ourselves and the health of our patients.

## 2024-04-15 NOTE — Progress Notes (Signed)
 "   PATIENT: Deanna Rowe DOB: [birth year]  REASON FOR VISIT: follow up HISTORY FROM: patient PRIMARY NEUROLOGIST: Dr. Buck Sleep Neurologist: Dr. Chalice  Chief Complaint  Patient presents with   Follow-up    Patient in room 4 with husband.  Patient is here for medication management with Parkinson's. Patient states no new or worsening symptoms at this time.     HISTORY OF PRESENT ILLNESS: Today 04/15/2024:  Deanna Rowe is a 69 y.o. female with a history of Parkinson's disease. Returns today for follow-up.  At the last visit with Dr. Buck she changed her Sinemet  to Sinemet  CR 5 times a day.  Patient states that she was taking that but then she noticed that she began to have more falls when she came off of Sinemet  IR.  She states she is currently taking Sinemet  CR 4 times a day and 1 tablet of Sinemet  IR at 2 PM.  She states that she has been doing this for about 2 months.  She does feel that it helped her gait and mobility.  She denies a tremor.  Husband notices a mild tremor in the jaw.  Denies any trouble chewing or swallowing food.  No drooling.  Now on dayvigo  and that helps with her sleep.  She broke her ankle in October and subsequently had surgery.  She is still in a boot.  She returns today for an evaluation.   (copied from Dr. Obie note) 10/27/23: she reports feeling stable, no recent falls, no recent exacerbation of symptoms.  She still takes a lot of medications and the high-dose levodopa , takes ropinirole  2 mg twice daily per psychiatry.  She also continues to take Trintellix , Ambien , gabapentin , Ativan , Rexulti , Wellbutrin .  She recently saw her psychiatrist.  She continues to take levodopa  25-250 mg strength twice daily and long-acting levodopa  50-200 mg strength 5 times a day.  She is willing to cut back on her levodopa  to see if she can get by with less.   She saw Dr. 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   03/13/23: Deanna Rowe is a 69 y.o. female with a history of parkinsonism and obstructive sleep apnea. Returns today for follow-up.  Patient saw Dr. Evonnie in July.  They discussed doing skin biopsies however the patient states that they were never contacted by the office regarding scheduling this.  Patient states that they plan to follow-up through our office.  She remains on Sinemet  CR 5 times a day and Sinemet  IR at 6 AM and 2 PM.  She states that this medication combination is working well for her.  She states that if she misses a dose or she is late her symptoms return and she is more prone to fall.  Denies a tremor.  Reports that she has trouble swallowing food and has to make sure that it is a small amount.  Denies any trouble sleeping.  She had a fall approximately 3 weeks ago.  No injuries other than scraping her arm.  12/02/21: Deanna Rowe is a 69 year old female with a history of parkinsonism and obstructive sleep apnea.  She returns today for follow-up.  She reports that in that last 2-3 weeks has had 5 falls. One episode she tripped over a stool, another time she was getting out of bed and her legs gave out. Denies tremor but husband reports that he has seen a tremor int he jaw. No trouble eating. Reports  that she has trouble getting her last tablet down --thought she should be taking it 6 times. Sleeping ok. Not using CPAP- could not tolerate it reports that it made her cough.   06/07/21 Deanna Rowe is a 69 year old female with a history of parkinsonism and obstructive sleep apnea on CPAP.  She returns today for follow-up.  She is here today with her husband.  She is currently on Sinemet  CR 50-200 milligrams 5 times a day and Sinemet  IR 25-250 mg twice a day.  She reports that this combination is working well for her.  Denies any significant tremor.  Reports sometimes her lips will quiver.  She reports over the last year she has had 3 falls.  Denies any significant changes in her gait  and balance.  She uses a cane at all times.  She does have achalasia and reports some trouble swallowing liquids and solids.  In regards to sleep apnea she reports that she has not been able to use her machine consistently due to an ongoing cough.  She is currently following up with her allergy specialist regarding this.  HISTORY (copied from Dr. Obie note) Deanna Rowe is a 69 year old right-handed woman with an underlying complex medical history of asthma, arthritis, anemia, avascular necrosis of the hip, depression, anxiety, type 2 diabetes, reflux disease, hyperlipidemia, hypertension, kidney disease, restless leg syndrome, history of TIA, thyroid  disease, vitamin D  deficiency, and obesity, who was diagnosed with parkinsonism several years ago.  She was noted to have right-sided signs.  She was on Abilify  in the past.  She is no longer on Abilify  for the past 2 years, but she is on multiple psychotropic medications including lorazepam , bupropion , Rexulti , Trintellix , and also takes gabapentin  as well as zolpidem .  She has clearly stopped the zolpidem  about 3 weeks ago and is currently on melatonin 5 mg each night which has been helpful.  She is on ropinirole  for restless leg syndrome per primary care, currently on 2 mg twice daily.  For parkinsonism she is on Sinemet  CR, 50-200 mg strength 1 pill 5 times a day.  She last saw Dr. Jenel on 08/26/2020, at which time she was advised to add Sinemet  IR 25-250 mg strength, 1 pill twice daily, in the morning and at 2 PM daily.     She has a history of urinary incontinence and is currently on Myrbetriq  and Vesicare  per urology.  She has chronic constipation for at least 1 year and has been on MiraLAX  daily, and 2 stool softeners daily. She has been using a cane.  Since the last appointment, she has not fallen thankfully.  She believes that the addition of Sinemet  IR has helped but she only takes 1 in the morning.   She had a brain MRI without contrast on  03/15/2017 and I reviewed the results:   IMPRESSION: Slightly abnormal MRI scan of the brain showing prominent changes of chronic microvascular ischemia.  No acute abnormalities noted.  Overall no significant change compared with prior MRI scan dated 05/23/2014.   For her sleep apnea, she is followed by Dr. Chalice and the nurse practitioner.  She reports 50% compliance with her CPAP.  Sometimes acid reflux bothers her at night which bothers her CPAP usage.    REVIEW OF SYSTEMS: Out of a complete 14 system review of symptoms, the patient complains only of the following symptoms, and all other reviewed systems are negative.  ALLERGIES: Allergies  Allergen Reactions   Fluticasone -Salmeterol Anaphylaxis   Codeine Nausea And  Vomiting   Fetzima [Levomilnacipran] Nausea And Vomiting   Hydrocodone -Acetaminophen  Nausea And Vomiting   Nsaids Other (See Comments)    Upset stomach    Trulicity [Dulaglutide] Nausea And Vomiting    Significant and undesirably rapid (40 lbs) weight loss    Xanax [Alprazolam] Other (See Comments)    disoriented   Amoxicillin Rash    Has patient had a PCN reaction causing immediate rash, facial/tongue/throat swelling, SOB or lightheadedness with hypotension: Yes Has patient had a PCN reaction causing severe rash involving mucus membranes or skin necrosis: No Has patient had a PCN reaction that required hospitalization: No Has patient had a PCN reaction occurring within the last 10 years: No If all of the above answers are NO, then may proceed with Cephalosporin use.    Pseudoephedrine  Hcl Er Palpitations    HOME MEDICATIONS: Outpatient Medications Prior to Visit  Medication Sig Dispense Refill   Accu-Chek FastClix Lancets MISC Use to check blood sugar 3 times daily. 306 each 4   ACCU-CHEK GUIDE test strip USE TO CHECK BLOOD SUGAR 3 TIMES PER DAY. 100 each 2   albuterol  (VENTOLIN  HFA) 108 (90 Base) MCG/ACT inhaler Inhale 2 puffs into the lungs as needed for  wheezing or shortness of breath. 18 g 2   amLODipine  (NORVASC ) 5 MG tablet Take 1 tablet (5 mg total) by mouth daily. 90 tablet 0   atorvastatin  (LIPITOR) 10 MG tablet Take 1 tablet (10 mg total) by mouth at bedtime. 90 tablet 3   azelastine  (ASTELIN ) 0.1 % nasal spray Place 2 sprays into both nostrils 2 (two) times daily as needed. 30 mL 5   Blood Glucose Monitoring Suppl (FREESTYLE LITE) w/Device KIT Use to check blood sugar three times daily. 1 kit 0   brexpiprazole  (REXULTI ) 2 MG TABS tablet Take 1 tablet (2 mg total) by mouth every morning. 90 tablet 1   buPROPion  (WELLBUTRIN  XL) 150 MG 24 hr tablet Take 3 tablets (450 mg total) by mouth every morning. 270 tablet 1   Calcium  Carbonate-Vitamin D  600-400 MG-UNIT tablet Take 1 tablet by mouth daily.     carbidopa -levodopa  (SINEMET  CR) 50-200 MG tablet Take 1 tablet by mouth 5 (five) times daily. 8am-11am-2pm-5pm-8pm. 450 tablet 3   dapagliflozin  propanediol (FARXIGA ) 5 MG TABS tablet Take 1 tablet by mouth once daily. 90 tablet 3   EPINEPHrine  (EPIPEN  2-PAK) 0.3 mg/0.3 mL IJ SOAJ injection Inject 0.3 mg into the muscle as needed for anaphylaxis. 2 each 1   gabapentin  (NEURONTIN ) 300 MG capsule Take 1 (one) capsule by mouth three times a day 270 capsule 1   glucose blood test strip Use to check blood sugar 4 times daily 400 each 3   Lemborexant  (DAYVIGO ) 10 MG TABS Take 1 tablet (10 mg total) by mouth at bedtime. 30 tablet 1   LORazepam  (ATIVAN ) 2 MG tablet Take 1 tablet (2 mg total) by mouth 3 (three) times daily. 270 tablet 1   losartan  (COZAAR ) 50 MG tablet Take 1 tablet (50 mg total) by mouth daily. 90 tablet 3   Olopatadine  HCl (PATADAY ) 0.2 % SOLN Place 1 drop into both eyes daily as needed. 2.5 mL 5   omalizumab  (XOLAIR ) 300 MG/2  ML prefilled syringe Inject 300 mg into the skin every 28 (twenty-eight) days. 2 mL 11   oxybutynin  (DITROPAN -XL) 10 MG 24 hr tablet Take 1 tablet (10 mg total) by mouth daily. 30 tablet 11   propranolol   (INDERAL ) 40 MG tablet Take 1 tablet (40 mg  total) by mouth 2 (two) times daily. 180 tablet 3   rivaroxaban  (XARELTO ) 10 MG TABS tablet Take 1 tablet (10 mg total) by mouth daily. 14 tablet 0   rOPINIRole  (REQUIP ) 2 MG tablet Take 1 tablet (2 mg total) by mouth 2 (two) times daily. 360 tablet 1   tirzepatide  (MOUNJARO ) 15 MG/0.5ML Pen Inject 15 mg into the skin once a week. 6 mL 0   triamcinolone  (NASACORT ) 55 MCG/ACT AERO nasal inhaler Place 2 sprays into the nose daily. 16.9 mL 5   valACYclovir  (VALTREX ) 500 MG tablet Take 1 tablet (500 mg total) by mouth daily. 90 tablet 4   vitamin C (ASCORBIC ACID) 500 MG tablet Take 500 mg by mouth daily.     Vitamin D , Ergocalciferol , 2000 units CAPS Take 1 capsule by mouth daily. 2000 units daily.     vortioxetine  HBr (TRINTELLIX ) 20 MG TABS tablet Take 1 tablet (20 mg total) by mouth every morning. 90 tablet 1   ACCU-CHEK FASTCLIX LANCETS MISC USE TO CHECK BLOOD SUGAR 3 TIMES A DAY 102 each 0   Continuous Blood Gluc Sensor (DEXCOM G7 SENSOR) MISC Use to check blood sugar and change sensor every 10 days (Patient not taking: Reported on 04/15/2024) 9 each 4   docusate sodium  (COLACE) 100 MG capsule Take 1 capsule (100 mg total) by mouth 2 (two) times daily. While taking narcotic pain medicine. (Patient not taking: Reported on 04/15/2024) 100 capsule 0   HYDROcodone -acetaminophen  (NORCO/VICODIN) 5-325 MG tablet Take 1 tablet by mouth every 6 (six) hours. (Patient not taking: Reported on 04/15/2024) 20 tablet 0   Lemborexant  (DAYVIGO ) 10 MG TABS Take 1 tablet (10 mg total) by mouth at bedtime. 30 tablet 1   oxyCODONE  (OXY IR/ROXICODONE ) 5 MG immediate release tablet Take 1 tablet (5 mg total) by mouth in the morning, at noon, and at bedtime for severe pain. (Patient not taking: Reported on 04/15/2024) 20 tablet 0   rOPINIRole  (REQUIP ) 2 MG tablet Take 1 tablet (2 mg total) by mouth 2 (two) times daily. 360 tablet 1   rOPINIRole  (REQUIP ) 2 MG tablet Take 1 tablet (2  mg total) by mouth 2 (two) times daily. 360 tablet 1   Suvorexant  (BELSOMRA ) 10 MG TABS Take 1 tablet (10 mg total) by mouth at bedtime. (Patient not taking: Reported on 04/15/2024) 30 tablet 2   zolpidem  (AMBIEN ) 5 MG tablet Take 1 tablet (5 mg total) by mouth at bedtime. (Patient not taking: Reported on 04/15/2024) 90 tablet 1   Facility-Administered Medications Prior to Visit  Medication Dose Route Frequency Provider Last Rate Last Admin   tranexamic acid  (CYKLOKAPRON ) topical -INTRAOP  2,000 mg Topical Once [place], [facility], PA-C        PAST MEDICAL HISTORY: Past Medical History:  Diagnosis Date   Anemia    Anxiety    Arthritis    L HIP   Asthma    Avascular necrosis of hip (HCC)    LEFT   Back pain    Chest pain    Constipation    Depression    Diabetes mellitus    Diabetes mellitus, type II (HCC)    Dry cough    Edema, lower extremity    Gait abnormality 11/18/2019   GERD (gastroesophageal reflux disease)    High cholesterol    Hypertension    Insomnia    takes Ambien  nightly   Joint pain    Kidney disease    Kidney disease    Obesity  Parkinsonism (HCC) 03/02/2017   PONV (postoperative nausea and vomiting)    RLS (restless legs syndrome) 01/15/2018   Sleep apnea    Swallowing difficulty    Thyroid  disease    TIA (transient ischemic attack) 2012   no residual problems   Urgency incontinence    Vitamin D  deficiency     PAST SURGICAL HISTORY: Past Surgical History:  Procedure Laterality Date   DILATION AND CURETTAGE OF UTERUS     ESOPHAGEAL MANOMETRY N/A 09/03/2012   Procedure: ESOPHAGEAL MANOMETRY (EM);  Surgeon: Gladis MARLA Louder, MD;  Location: [facility];  Service: Endoscopy;  Laterality: N/A;   ESOPHAGEAL MANOMETRY N/A 06/19/2017   Procedure: ESOPHAGEAL MANOMETRY (EM);  Surgeon: Rosalie Kitchens, MD;  Location: [facility];  Service: Endoscopy;  Laterality: N/A;   EXPLORATORY LAPAROTOMY     GANGLION CYST EXCISION     JOINT REPLACEMENT  2011   rt total  hip   KNEE ARTHROSCOPY     ORIF ANKLE FRACTURE Left 01/11/2024   Procedure: OPEN REDUCTION INTERNAL FIXATION (ORIF) LEFT ANKLE LATERAL MALLEOLUS;  Surgeon: Kit Rush, MD;  Location: [facility];  Service: Orthopedics;  Laterality: Left;   PILONIDAL CYST / SINUS EXCISION     SYNDESMOSIS REPAIR Left 01/11/2024   Procedure: SYNDESMOSIS REPAIR, ANKLE;  Surgeon: Kit Rush, MD;  Location: [facility];  Service: Orthopedics;  Laterality: Left;   TOTAL HIP ARTHROPLASTY Left 10/09/2013   Procedure: LEFT TOTAL HIP ARTHROPLASTY ANTERIOR APPROACH;  Surgeon: Dempsey Melodi GAILS, MD;  Location: [facility];  Service: Orthopedics;  Laterality: Left;    FAMILY HISTORY: Family History  Problem Relation Age of Onset   High blood pressure Mother    Hyperlipidemia Mother    Heart disease Mother    Heart disease Father    Alcoholism Father    Allergic rhinitis Sister    Alcohol abuse Brother    Alcohol abuse Brother    Alcohol abuse Brother    Alcohol abuse Brother    Alzheimer's disease Maternal Aunt    Healthy Child    Diabetes Neg Hx    Angioedema Neg Hx    Asthma Neg Hx    Eczema Neg Hx    Immunodeficiency Neg Hx    Urticaria Neg Hx    Breast cancer Neg Hx    Parkinson's disease Neg Hx    Sleep apnea Neg Hx     SOCIAL HISTORY: Social History   Socioeconomic History   Marital status: Married    Spouse name: Marinda   Number of children: Not on file   Years of education: Not on file   Highest education level: Not on file  Occupational History   Occupation: retired [employer]  Tobacco Use   Smoking status: Never   Smokeless tobacco: Never  Vaping Use   Vaping status: Never Used  Substance and Sexual Activity   Alcohol use: No    Alcohol/week: 0.0 standard drinks of alcohol   Drug use: No   Sexual activity: Not Currently  Other Topics Concern   Not on file  Social History Narrative   Lives with husband   Caffeine use: none   Right handed    Patient is retired     [employer]   Tobacco Use: Low Risk (04/08/2024)   Received from [facility]   Patient History    Smoking Tobacco Use: Never    Smokeless Tobacco Use: Never    Passive Exposure: Not on file  Financial  Resource Strain: Not on file  Food Insecurity: Low Risk (02/13/2023)   Received from [facility]   Epic    Within the past 12 months, you worried that your food would run out before you got money to buy more: Never true    Within the past 12 months, the food you bought just didn't last and you didn't have money to get more. : Never true  Transportation Needs: No Transportation Needs (02/13/2023)   Received from [facility]    In the past 12 months, has lack of reliable transportation kept you from medical appointments, meetings, work or from getting things needed for daily living? : No  Physical Activity: Not on file  Stress: Not on file  Social Connections: Not on file  Intimate Partner Violence: Not on file  Depression (EYV7-0): Not on file  Alcohol Screen: Not on file  Housing: Low Risk (02/13/2023)   Received from [facility]   Epic    What is your living situation today?: I have a steady place to live    Think about the place you live. Do you have problems with any of the following? Choose all that apply:: None/None on this list  Utilities: Low Risk (02/13/2023)   Received from [facility]   Utilities    In the past 12 months has the electric, gas, oil, or water company threatened to shut off services in your home? : No  Health Literacy: Not on file      PHYSICAL EXAM  Vitals:   04/15/24 1330  BP: 116/71  Pulse: 67  Weight: 197 lb 12.8 oz (89.7 kg)  Height: 5' 6 (1.676 m)     Body mass index is 31.93 kg/m.  Generalized: Well developed, in no acute distress   Neurological examination  Mentation: Alert oriented to time, place, history taking. Follows all commands speech and language fluent Cranial nerve II-XII:  Pupils were equal round reactive to light. Extraocular movements were full, visual field were full on confrontational test. Facial sensation and strength were normal. Head turning and shoulder shrug  were normal and symmetric.   Motor: The motor testing reveals 5 over 5 strength of all 4 extremities. Good symmetric motor tone is noted throughout.  Decreased speed with finger taps and rapid alternating movement.  Tremor noted in the jaw. Sensory: Sensory testing is intact to soft touch on all 4 extremities. No evidence of extinction is noted.  Coordination: Cerebellar testing reveals good finger-nose-finger and heel-to-shin bilaterally.  Gait: Has a boot on the left foot.  Uses a rollator when ambulating.    DIAGNOSTIC DATA (LABS, IMAGING, TESTING) - I reviewed patient records, labs, notes, testing and imaging myself where available.  Lab Results  Component Value Date   WBC 5.4 05/15/2023   HGB 12.3 05/15/2023   HCT 36.4 05/15/2023   MCV 99 (H) 05/15/2023   PLT 242 05/15/2023      Component Value Date/Time   NA 129 (L) 01/10/2024 1503   NA 133 (A) 08/29/2023 0000   NA 142 01/12/2017 0914   K 4.8 01/10/2024 1503   K 4.2 01/12/2017 0914   CL 95 (L) 01/10/2024 1503   CO2 26 01/10/2024 1503   CO2 28 01/12/2017 0914   GLUCOSE 105 (H) 01/10/2024 1503   GLUCOSE 142 (H) 01/12/2017 0914   BUN 14 01/10/2024 1503   BUN 21 08/29/2023 0000   BUN 12.0 01/12/2017 0914   CREATININE 1.40 (H) 01/10/2024 1503   CREATININE  1.58 (H) 10/12/2018 1121   CREATININE 1.3 (H) 01/12/2017 0914   CALCIUM  9.1 01/10/2024 1503   CALCIUM  9.6 01/12/2017 0914   PROT 7.2 08/27/2022 2108   PROT 7.1 04/05/2022 0942   PROT 7.2 01/12/2017 0914   ALBUMIN 4.2 08/30/2023 0000   ALBUMIN 4.3 04/05/2022 0942   ALBUMIN 3.5 01/12/2017 0914   AST 14 08/29/2023 0000   AST 16 10/12/2018 1121   AST 19 01/12/2017 0914   ALT 9 08/29/2023 0000   ALT <6 10/12/2018 1121   ALT 18 01/12/2017 0914   ALKPHOS 50 08/29/2023 0000    ALKPHOS 43 01/12/2017 0914   BILITOT 0.4 08/27/2022 2108   BILITOT 0.3 04/05/2022 0942   BILITOT 0.2 (L) 10/12/2018 1121   BILITOT 0.28 01/12/2017 0914   GFRNONAA 41 (L) 01/10/2024 1503   GFRNONAA 34 (L) 10/12/2018 1121   GFRAA 44 (L) 03/02/2020 0920   GFRAA 40 (L) 10/12/2018 1121   Lab Results  Component Value Date   CHOL 144 02/22/2023   HDL 73 (A) 02/22/2023   LDLCALC 57 04/16/2019   TRIG 65 02/22/2023   Lab Results  Component Value Date   HGBA1C 6.1 08/29/2023   Lab Results  Component Value Date   VITAMINB12 112 08/24/2023   VITAMINB12 112 08/24/2023   Lab Results  Component Value Date   TSH 1.19 02/22/2023      ASSESSMENT AND PLAN 69 y.o. year old female  has a past medical history of Anemia, Anxiety, Arthritis, Asthma, Avascular necrosis of hip (HCC), Back pain, Chest pain, Constipation, Depression, Diabetes mellitus, Diabetes mellitus, type II (HCC), Dry cough, Edema, lower extremity, Gait abnormality (11/18/2019), GERD (gastroesophageal reflux disease), High cholesterol, Hypertension, Insomnia, Joint pain, Kidney disease, Kidney disease, Obesity, Parkinsonism (HCC) (03/02/2017), PONV (postoperative nausea and vomiting), RLS (restless legs syndrome) (01/15/2018), Sleep apnea, Swallowing difficulty, Thyroid  disease, TIA (transient ischemic attack) (2012), Urgency incontinence, and Vitamin D  deficiency. here with:   1.  Parkinsonism   Continue Sinemet  CR 50-200 mg 4 times a day Advised I would discuss starting Sinemet  IR with Dr. Buck as this was discontinued at the last visit. Encouraged her to continue using her walker. Advised her to stay well-hydrated Follow-up in 6 months or sooner if needed   Advised if symptoms worsen or she develops new symptoms she should let us  know follow-up in 6 months or sooner if needed     Duwaine Russell, MSN, NP-C 04/15/2024, 1:36 PM [facility] [address], [address] [place], [place] [address] [phone]  "

## 2024-04-15 NOTE — Progress Notes (Signed)
 I reviewed the above note and documentation by the Nurse Practitioner and agree with the history, exam, assessment and plan as outlined above.  May continue with long-acting levodopa  4 times a day and generic Sinemet  IR once daily.  I would avoid increasing the IR anytime soon.  I was available for consultation. True Mar, MD, PhD [facility])

## 2024-04-16 ENCOUNTER — Other Ambulatory Visit (HOSPITAL_BASED_OUTPATIENT_CLINIC_OR_DEPARTMENT_OTHER): Payer: Self-pay

## 2024-04-16 ENCOUNTER — Telehealth: Payer: Self-pay | Admitting: *Deleted

## 2024-04-16 ENCOUNTER — Other Ambulatory Visit: Payer: Self-pay

## 2024-04-16 MED ORDER — CARBIDOPA-LEVODOPA ER 50-200 MG PO TBCR
1.0000 | EXTENDED_RELEASE_TABLET | Freq: Four times a day (QID) | ORAL | 3 refills | Status: AC
  Filled 2024-04-16: qty 360, 90d supply, fill #0

## 2024-04-16 MED ORDER — CARBIDOPA-LEVODOPA 25-250 MG PO TABS
1.0000 | ORAL_TABLET | Freq: Every day | ORAL | 11 refills | Status: AC
  Filled 2024-04-16: qty 30, 30d supply, fill #0
  Filled 2024-05-10: qty 30, 30d supply, fill #1

## 2024-04-16 NOTE — Addendum Note (Signed)
 Addended by: SHERRYL DUWAINE SQUIBB on: 04/16/2024 09:26 AM   Modules accepted: Orders

## 2024-04-16 NOTE — Telephone Encounter (Signed)
 Spoke to patient aware ok to continuing current medication regimen for Sinemet 

## 2024-04-16 NOTE — Telephone Encounter (Signed)
-----   Message from Duwaine Russell, NP sent at 04/16/2024  9:26 AM EST ----- Please call patient and make her aware that Dr. Buck was okay with her continuing on Sinemet  long-acting 4 times a day and taking Sinemet  IR at 2 PM.  I have sent a prescription in for both.

## 2024-04-17 ENCOUNTER — Other Ambulatory Visit: Payer: Self-pay

## 2024-04-17 ENCOUNTER — Encounter (INDEPENDENT_AMBULATORY_CARE_PROVIDER_SITE_OTHER): Payer: Self-pay | Admitting: Family Medicine

## 2024-04-17 ENCOUNTER — Ambulatory Visit (INDEPENDENT_AMBULATORY_CARE_PROVIDER_SITE_OTHER): Admitting: Family Medicine

## 2024-04-17 ENCOUNTER — Other Ambulatory Visit (HOSPITAL_BASED_OUTPATIENT_CLINIC_OR_DEPARTMENT_OTHER): Payer: Self-pay

## 2024-04-17 VITALS — BP 99/66 | HR 72 | Temp 97.5°F | Ht 65.0 in | Wt 190.0 lb

## 2024-04-17 DIAGNOSIS — Z7985 Long-term (current) use of injectable non-insulin antidiabetic drugs: Secondary | ICD-10-CM

## 2024-04-17 DIAGNOSIS — E669 Obesity, unspecified: Secondary | ICD-10-CM | POA: Diagnosis not present

## 2024-04-17 DIAGNOSIS — S82892S Other fracture of left lower leg, sequela: Secondary | ICD-10-CM

## 2024-04-17 DIAGNOSIS — Z794 Long term (current) use of insulin: Secondary | ICD-10-CM

## 2024-04-17 DIAGNOSIS — S82899A Other fracture of unspecified lower leg, initial encounter for closed fracture: Secondary | ICD-10-CM | POA: Diagnosis not present

## 2024-04-17 DIAGNOSIS — Z6831 Body mass index (BMI) 31.0-31.9, adult: Secondary | ICD-10-CM | POA: Diagnosis not present

## 2024-04-17 DIAGNOSIS — E119 Type 2 diabetes mellitus without complications: Secondary | ICD-10-CM | POA: Diagnosis not present

## 2024-04-17 DIAGNOSIS — E1169 Type 2 diabetes mellitus with other specified complication: Secondary | ICD-10-CM

## 2024-04-17 MED ORDER — TIRZEPATIDE 15 MG/0.5ML ~~LOC~~ SOAJ
15.0000 mg | SUBCUTANEOUS | 0 refills | Status: AC
  Filled 2024-04-17: qty 6, 84d supply, fill #0

## 2024-04-17 NOTE — Progress Notes (Signed)
 "  Office: [phone]  /  Fax: [phone]  WEIGHT SUMMARY AND BIOMETRICS  Anthropometric Measurements Height: 5' 5 (1.651 m) (rechecked height today) Weight: 190 lb (86.2 kg) (without cast or boot) BMI (Calculated): 31.62 Weight at Last Visit: 193 lb Weight Lost Since Last Visit: 3 lb Weight Gained Since Last Visit: 0 Starting Weight: 237 lb Total Weight Loss (lbs): 47 lb (21.3 kg) Peak Weight: 240 lb   Body Composition  Body Fat %: 44.1 % Fat Mass (lbs): 84 lbs Muscle Mass (lbs): 101 lbs Total Body Water (lbs): 77.8 lbs Visceral Fat Rating : 13   Other Clinical Data Fasting: no Labs: no Today's Visit #: 60 Starting Date: 04/16/19    Chief Complaint: OBESITY   History of Present Illness Deanna Rowe is a 69 year old female with obesity who presents for a follow-up on her obesity treatment plan.  She has been following the category two eating plan approximately 75% of the time. Despite this, she has gained three pounds over the last two months, attributing the weight gain to the holiday season. She is working on resuming her dietary regimen.  Physical activity has been limited due to a broken ankle sustained three months ago from a fall while getting into bed. She is not yet cleared for physical therapy but hopes to resume gym activities next month.  She is currently on Mounjaro  15 mg for type 2 diabetes, with no nausea reported. She takes Mounjaro  on Mondays.  Her diet includes meals such as chicken, cheese, and potatoes, with an emphasis on maintaining protein intake. Breakfast typically consists of a banana or cereal, which she acknowledges lacks protein.  She has been exploring meal prep options that align with her nutritional goals, including recipes that can be prepared in advance and stored in the freezer.      PHYSICAL EXAM:  Blood pressure 99/66, pulse 72, temperature (!) 97.5 F (36.4 C), height 5' 5 (1.651 m), weight 190 lb (86.2 kg), SpO2  95%. Body mass index is 31.62 kg/m.  DIAGNOSTIC DATA REVIEWED BY MYSELF TODAY:  BMET    Component Value Date/Time   NA 129 (L) 01/10/2024 1503   NA 133 (A) 08/29/2023 0000   NA 142 01/12/2017 0914   K 4.8 01/10/2024 1503   K 4.2 01/12/2017 0914   CL 95 (L) 01/10/2024 1503   CO2 26 01/10/2024 1503   CO2 28 01/12/2017 0914   GLUCOSE 105 (H) 01/10/2024 1503   GLUCOSE 142 (H) 01/12/2017 0914   BUN 14 01/10/2024 1503   BUN 21 08/29/2023 0000   BUN 12.0 01/12/2017 0914   CREATININE 1.40 (H) 01/10/2024 1503   CREATININE 1.58 (H) 10/12/2018 1121   CREATININE 1.3 (H) 01/12/2017 0914   CALCIUM  9.1 01/10/2024 1503   CALCIUM  9.6 01/12/2017 0914   GFRNONAA 41 (L) 01/10/2024 1503   GFRNONAA 34 (L) 10/12/2018 1121   GFRAA 44 (L) 03/02/2020 0920   GFRAA 40 (L) 10/12/2018 1121   Lab Results  Component Value Date   HGBA1C 6.1 08/29/2023   HGBA1C 7.3 09/04/2014   Lab Results  Component Value Date   INSULIN  12.3 04/16/2019   Lab Results  Component Value Date   TSH 1.19 02/22/2023   CBC    Component Value Date/Time   WBC 5.4 05/15/2023 1149   WBC 6.9 08/27/2022 2108   RBC 3.69 (L) 05/15/2023 1149   RBC 3.70 (A) 02/22/2023 0000   HGB 12.3 05/15/2023 1149   HGB 11.4 (L) 01/12/2017  0913   HCT 36.4 05/15/2023 1149   HCT 35.2 01/12/2017 0913   PLT 242 05/15/2023 1149   MCV 99 (H) 05/15/2023 1149   MCV 100.9 01/12/2017 0913   MCH 33.3 (H) 05/15/2023 1149   MCH 33.6 08/27/2022 2108   MCHC 33.8 05/15/2023 1149   MCHC 34.1 08/27/2022 2108   RDW 12.8 05/15/2023 1149   RDW 14.1 01/12/2017 0913   Iron Studies No results found for: IRON, TIBC, FERRITIN, IRONPCTSAT Lipid Panel     Component Value Date/Time   CHOL 144 02/22/2023 0000   CHOL 155 04/16/2019 1451   TRIG 65 02/22/2023 0000   HDL 73 (A) 02/22/2023 0000   HDL 84 04/16/2019 1451   LDLCALC 57 04/16/2019 1451   Hepatic Function Panel     Component Value Date/Time   PROT 7.2 08/27/2022 2108   PROT 7.1  04/05/2022 0942   PROT 7.2 01/12/2017 0914   ALBUMIN 4.2 08/30/2023 0000   ALBUMIN 4.3 04/05/2022 0942   ALBUMIN 3.5 01/12/2017 0914   AST 14 08/29/2023 0000   AST 16 10/12/2018 1121   AST 19 01/12/2017 0914   ALT 9 08/29/2023 0000   ALT <6 10/12/2018 1121   ALT 18 01/12/2017 0914   ALKPHOS 50 08/29/2023 0000   ALKPHOS 43 01/12/2017 0914   BILITOT 0.4 08/27/2022 2108   BILITOT 0.3 04/05/2022 0942   BILITOT 0.2 (L) 10/12/2018 1121   BILITOT 0.28 01/12/2017 0914      Component Value Date/Time   TSH 1.19 02/22/2023 0000   TSH 1.200 04/05/2022 0942   Nutritional Lab Results  Component Value Date   VD25OH 44.8 12/17/2019   VD25OH 40.9 04/16/2019     Assessment and Plan Assessment & Plan Obesity and recent ankle fracture, impacting ability to exercise. Management is ongoing with a focus on dietary modifications and physical activity. She has gained three pounds over the last two months, which is better than average for the holiday period. She is following the category two eating plan 75% of the time and is currently unable to engage in gym activities due to a recent ankle fracture. Physical therapy is ongoing for the ankle. She is exploring meal prepping options to meet nutritional requirements and maintain weight loss goals. - Provided recipes for meal prepping that meet nutritional requirements and are easy to prepare. - Encouraged continuation of physical therapy for ankle rehabilitation. - Advised on adjusting Mounjaro  administration day if needed, with gradual changes to avoid gastrointestinal discomfort or blood sugar fluctuations.  Type 2 diabetes mellitus, stable Managed with Mounjaro  and lifestyle modifications. She reports no significant side effects from Mounjaro , such as nausea. The medication's effects are strongest 24 to 48 hours post-administration. She is advised to adjust the administration day if necessary, with gradual changes to avoid adverse effects. She is also  encouraged to focus on dietary protein intake, especially at breakfast, to support diabetes management. - Continue Mounjaro  15 mg for diabetes management. - Encouraged dietary modifications to include more protein, especially at breakfast, using options like protein shakes. - Scheduled follow-up labs in one month to monitor diabetes management. - Refilled Mounjaro  15 mg prescription.      Patients who are on anti-obesity medications are counseled on the importance of maintaining healthy lifestyle habits, including balanced nutrition, regular physical activity, and behavioral modifications,  Medication is an adjunct to, not a replacement for, lifestyle changes and that the long-term success and weight maintenance depend on continued adherence to these strategies.   Deanna Rowe  was informed of the importance of frequent follow up visits to maximize her success with intensive lifestyle modifications for her obesity and obesity related health conditions as recommended by USPSTF and CMS guidelines  Louann Penton, MD   "

## 2024-04-23 ENCOUNTER — Other Ambulatory Visit (HOSPITAL_COMMUNITY): Payer: Self-pay

## 2024-04-25 ENCOUNTER — Other Ambulatory Visit: Payer: Self-pay

## 2024-04-29 ENCOUNTER — Other Ambulatory Visit: Payer: Self-pay

## 2024-04-29 ENCOUNTER — Other Ambulatory Visit (HOSPITAL_COMMUNITY): Payer: Self-pay

## 2024-04-29 ENCOUNTER — Other Ambulatory Visit: Payer: Self-pay | Admitting: Pharmacy Technician

## 2024-04-29 NOTE — Progress Notes (Signed)
 Specialty Pharmacy Refill Coordination Note  Deanna Rowe is a 69 y.o. female contacted today regarding refills of specialty medication(s) Omalizumab  (Xolair )   Patient requested Pickup at [facility] at [place] date: 05/01/24   Medication will be filled on: 04/30/24

## 2024-04-30 ENCOUNTER — Other Ambulatory Visit: Payer: Self-pay

## 2024-05-07 ENCOUNTER — Other Ambulatory Visit (HOSPITAL_BASED_OUTPATIENT_CLINIC_OR_DEPARTMENT_OTHER): Payer: Self-pay

## 2024-05-07 ENCOUNTER — Other Ambulatory Visit (HOSPITAL_COMMUNITY): Payer: Self-pay

## 2024-05-08 ENCOUNTER — Other Ambulatory Visit (HOSPITAL_BASED_OUTPATIENT_CLINIC_OR_DEPARTMENT_OTHER): Payer: Self-pay

## 2024-05-08 MED ORDER — DAYVIGO 10 MG PO TABS
10.0000 mg | ORAL_TABLET | Freq: Every day | ORAL | 1 refills | Status: AC
  Filled 2024-05-08 – 2024-05-10 (×2): qty 30, 30d supply, fill #0

## 2024-05-10 ENCOUNTER — Other Ambulatory Visit: Payer: Self-pay

## 2024-05-10 ENCOUNTER — Other Ambulatory Visit (HOSPITAL_BASED_OUTPATIENT_CLINIC_OR_DEPARTMENT_OTHER): Payer: Self-pay

## 2024-05-21 ENCOUNTER — Ambulatory Visit (INDEPENDENT_AMBULATORY_CARE_PROVIDER_SITE_OTHER): Admitting: Family Medicine

## 2024-05-30 ENCOUNTER — Ambulatory Visit: Admitting: Adult Health

## 2024-06-03 ENCOUNTER — Ambulatory Visit: Admitting: Adult Health

## 2024-06-18 ENCOUNTER — Ambulatory Visit (INDEPENDENT_AMBULATORY_CARE_PROVIDER_SITE_OTHER): Admitting: Family Medicine

## 2024-06-19 ENCOUNTER — Ambulatory Visit (INDEPENDENT_AMBULATORY_CARE_PROVIDER_SITE_OTHER): Admitting: Family Medicine

## 2024-10-15 ENCOUNTER — Ambulatory Visit: Admitting: Neurology
